# Patient Record
Sex: Female | Born: 1945
Health system: Southern US, Community
[De-identification: ages and names within clinical notes are randomized; demographics above are authoritative.]

## PROBLEM LIST (undated history)

## (undated) DIAGNOSIS — I472 Ventricular tachycardia, unspecified: Secondary | ICD-10-CM

## (undated) DIAGNOSIS — E119 Type 2 diabetes mellitus without complications: Secondary | ICD-10-CM

## (undated) DIAGNOSIS — I422 Other hypertrophic cardiomyopathy: Secondary | ICD-10-CM

## (undated) DIAGNOSIS — Z1509 Genetic susceptibility to other malignant neoplasm: Secondary | ICD-10-CM

## (undated) DIAGNOSIS — I5032 Chronic diastolic (congestive) heart failure: Secondary | ICD-10-CM

## (undated) DIAGNOSIS — I480 Paroxysmal atrial fibrillation: Secondary | ICD-10-CM

## (undated) DIAGNOSIS — R942 Abnormal results of pulmonary function studies: Secondary | ICD-10-CM

## (undated) DIAGNOSIS — I2510 Atherosclerotic heart disease of native coronary artery without angina pectoris: Secondary | ICD-10-CM

## (undated) DIAGNOSIS — I517 Cardiomegaly: Secondary | ICD-10-CM

## (undated) DIAGNOSIS — Z803 Family history of malignant neoplasm of breast: Secondary | ICD-10-CM

## (undated) DIAGNOSIS — I340 Nonrheumatic mitral (valve) insufficiency: Secondary | ICD-10-CM

## (undated) DIAGNOSIS — IMO0001 Reserved for inherently not codable concepts without codable children: Secondary | ICD-10-CM

## (undated) DIAGNOSIS — I214 Non-ST elevation (NSTEMI) myocardial infarction: Secondary | ICD-10-CM

## (undated) DIAGNOSIS — I071 Rheumatic tricuspid insufficiency: Secondary | ICD-10-CM

## (undated) HISTORY — DX: Chronic diastolic (congestive) heart failure: I50.32

## (undated) HISTORY — DX: Family history of malignant neoplasm of breast: Z80.3

## (undated) HISTORY — DX: Type 2 diabetes mellitus without complications: E11.9

## (undated) HISTORY — DX: Abnormal results of pulmonary function studies: R94.2

## (undated) HISTORY — DX: Genetic susceptibility to other malignant neoplasm: Z15.09

## (undated) HISTORY — DX: Nonrheumatic mitral (valve) insufficiency: I34.0

## (undated) HISTORY — DX: Paroxysmal atrial fibrillation: I48.0

---

## 1998-12-01 ENCOUNTER — Emergency Department (HOSPITAL_COMMUNITY): Admission: EM | Admit: 1998-12-01 | Discharge: 1998-12-01 | Payer: Self-pay | Admitting: Emergency Medicine

## 1998-12-01 ENCOUNTER — Encounter: Payer: Self-pay | Admitting: Emergency Medicine

## 1998-12-12 ENCOUNTER — Encounter: Admission: RE | Admit: 1998-12-12 | Discharge: 1999-03-12 | Payer: Self-pay | Admitting: *Deleted

## 2010-05-04 HISTORY — PX: DENTAL SURGERY: SHX609

## 2012-12-23 DIAGNOSIS — IMO0002 Reserved for concepts with insufficient information to code with codable children: Secondary | ICD-10-CM | POA: Diagnosis not present

## 2012-12-23 DIAGNOSIS — H2510 Age-related nuclear cataract, unspecified eye: Secondary | ICD-10-CM | POA: Diagnosis not present

## 2012-12-29 DIAGNOSIS — R7309 Other abnormal glucose: Secondary | ICD-10-CM | POA: Diagnosis not present

## 2012-12-29 DIAGNOSIS — IMO0002 Reserved for concepts with insufficient information to code with codable children: Secondary | ICD-10-CM | POA: Diagnosis not present

## 2012-12-31 ENCOUNTER — Inpatient Hospital Stay (HOSPITAL_COMMUNITY)
Admission: EM | Admit: 2012-12-31 | Discharge: 2013-01-17 | DRG: 571 | Disposition: A | Discharge status: Skilled Nursing Facility | Payer: Medicare Other | Attending: Family Medicine | Admitting: Family Medicine

## 2012-12-31 ENCOUNTER — Emergency Department (HOSPITAL_COMMUNITY): Payer: Medicare Other

## 2012-12-31 ENCOUNTER — Encounter (HOSPITAL_COMMUNITY): Payer: Self-pay | Admitting: Nurse Practitioner

## 2012-12-31 DIAGNOSIS — I059 Rheumatic mitral valve disease, unspecified: Secondary | ICD-10-CM | POA: Diagnosis present

## 2012-12-31 DIAGNOSIS — K5900 Constipation, unspecified: Secondary | ICD-10-CM | POA: Diagnosis not present

## 2012-12-31 DIAGNOSIS — Z4889 Encounter for other specified surgical aftercare: Secondary | ICD-10-CM | POA: Diagnosis not present

## 2012-12-31 DIAGNOSIS — I82629 Acute embolism and thrombosis of deep veins of unspecified upper extremity: Secondary | ICD-10-CM | POA: Diagnosis not present

## 2012-12-31 DIAGNOSIS — A4901 Methicillin susceptible Staphylococcus aureus infection, unspecified site: Secondary | ICD-10-CM | POA: Diagnosis present

## 2012-12-31 DIAGNOSIS — I421 Obstructive hypertrophic cardiomyopathy: Secondary | ICD-10-CM | POA: Diagnosis not present

## 2012-12-31 DIAGNOSIS — Z5189 Encounter for other specified aftercare: Secondary | ICD-10-CM | POA: Diagnosis not present

## 2012-12-31 DIAGNOSIS — I9589 Other hypotension: Secondary | ICD-10-CM | POA: Diagnosis not present

## 2012-12-31 DIAGNOSIS — M7989 Other specified soft tissue disorders: Secondary | ICD-10-CM | POA: Diagnosis not present

## 2012-12-31 DIAGNOSIS — D62 Acute posthemorrhagic anemia: Secondary | ICD-10-CM | POA: Diagnosis not present

## 2012-12-31 DIAGNOSIS — I959 Hypotension, unspecified: Secondary | ICD-10-CM | POA: Diagnosis not present

## 2012-12-31 DIAGNOSIS — M7020 Olecranon bursitis, unspecified elbow: Secondary | ICD-10-CM | POA: Diagnosis not present

## 2012-12-31 DIAGNOSIS — I4892 Unspecified atrial flutter: Secondary | ICD-10-CM | POA: Diagnosis not present

## 2012-12-31 DIAGNOSIS — E876 Hypokalemia: Secondary | ICD-10-CM | POA: Diagnosis not present

## 2012-12-31 DIAGNOSIS — IMO0002 Reserved for concepts with insufficient information to code with codable children: Secondary | ICD-10-CM | POA: Diagnosis not present

## 2012-12-31 DIAGNOSIS — E119 Type 2 diabetes mellitus without complications: Secondary | ICD-10-CM | POA: Diagnosis present

## 2012-12-31 DIAGNOSIS — L0390 Cellulitis, unspecified: Secondary | ICD-10-CM | POA: Diagnosis present

## 2012-12-31 DIAGNOSIS — D72829 Elevated white blood cell count, unspecified: Secondary | ICD-10-CM | POA: Diagnosis not present

## 2012-12-31 DIAGNOSIS — S51809A Unspecified open wound of unspecified forearm, initial encounter: Secondary | ICD-10-CM | POA: Diagnosis not present

## 2012-12-31 DIAGNOSIS — Z6830 Body mass index (BMI) 30.0-30.9, adult: Secondary | ICD-10-CM | POA: Diagnosis not present

## 2012-12-31 DIAGNOSIS — S46909A Unspecified injury of unspecified muscle, fascia and tendon at shoulder and upper arm level, unspecified arm, initial encounter: Secondary | ICD-10-CM | POA: Diagnosis not present

## 2012-12-31 DIAGNOSIS — L089 Local infection of the skin and subcutaneous tissue, unspecified: Secondary | ICD-10-CM | POA: Diagnosis not present

## 2012-12-31 DIAGNOSIS — E669 Obesity, unspecified: Secondary | ICD-10-CM | POA: Diagnosis present

## 2012-12-31 DIAGNOSIS — M609 Myositis, unspecified: Secondary | ICD-10-CM

## 2012-12-31 DIAGNOSIS — M7022 Olecranon bursitis, left elbow: Secondary | ICD-10-CM

## 2012-12-31 DIAGNOSIS — R5381 Other malaise: Secondary | ICD-10-CM | POA: Diagnosis not present

## 2012-12-31 DIAGNOSIS — A419 Sepsis, unspecified organism: Secondary | ICD-10-CM | POA: Diagnosis not present

## 2012-12-31 DIAGNOSIS — M60009 Infective myositis, unspecified site: Secondary | ICD-10-CM | POA: Diagnosis not present

## 2012-12-31 DIAGNOSIS — E871 Hypo-osmolality and hyponatremia: Secondary | ICD-10-CM | POA: Diagnosis present

## 2012-12-31 DIAGNOSIS — I422 Other hypertrophic cardiomyopathy: Secondary | ICD-10-CM | POA: Diagnosis present

## 2012-12-31 DIAGNOSIS — I951 Orthostatic hypotension: Secondary | ICD-10-CM | POA: Diagnosis not present

## 2012-12-31 DIAGNOSIS — R42 Dizziness and giddiness: Secondary | ICD-10-CM | POA: Diagnosis not present

## 2012-12-31 DIAGNOSIS — L03114 Cellulitis of left upper limb: Secondary | ICD-10-CM

## 2012-12-31 DIAGNOSIS — R7301 Impaired fasting glucose: Secondary | ICD-10-CM | POA: Diagnosis present

## 2012-12-31 DIAGNOSIS — IMO0001 Reserved for inherently not codable concepts without codable children: Secondary | ICD-10-CM | POA: Diagnosis present

## 2012-12-31 DIAGNOSIS — R269 Unspecified abnormalities of gait and mobility: Secondary | ICD-10-CM | POA: Diagnosis not present

## 2012-12-31 DIAGNOSIS — S4980XA Other specified injuries of shoulder and upper arm, unspecified arm, initial encounter: Secondary | ICD-10-CM | POA: Diagnosis not present

## 2012-12-31 DIAGNOSIS — R21 Rash and other nonspecific skin eruption: Secondary | ICD-10-CM | POA: Diagnosis not present

## 2012-12-31 DIAGNOSIS — I4891 Unspecified atrial fibrillation: Secondary | ICD-10-CM | POA: Diagnosis present

## 2012-12-31 DIAGNOSIS — R279 Unspecified lack of coordination: Secondary | ICD-10-CM | POA: Diagnosis not present

## 2012-12-31 DIAGNOSIS — I749 Embolism and thrombosis of unspecified artery: Secondary | ICD-10-CM | POA: Diagnosis not present

## 2012-12-31 DIAGNOSIS — R000 Tachycardia, unspecified: Secondary | ICD-10-CM

## 2012-12-31 DIAGNOSIS — L0291 Cutaneous abscess, unspecified: Secondary | ICD-10-CM | POA: Diagnosis not present

## 2012-12-31 DIAGNOSIS — M79609 Pain in unspecified limb: Secondary | ICD-10-CM | POA: Diagnosis not present

## 2012-12-31 DIAGNOSIS — R7881 Bacteremia: Secondary | ICD-10-CM | POA: Diagnosis not present

## 2012-12-31 HISTORY — DX: Type 2 diabetes mellitus without complications: E11.9

## 2012-12-31 LAB — BASIC METABOLIC PANEL
BUN: 24 mg/dL — ABNORMAL HIGH (ref 6–23)
CO2: 25 mEq/L (ref 19–32)
Calcium: 9.2 mg/dL (ref 8.4–10.5)
Chloride: 91 mEq/L — ABNORMAL LOW (ref 96–112)
Creatinine, Ser: 0.79 mg/dL (ref 0.50–1.10)
GFR calc Af Amer: >90 mL/min (ref >90)
GFR calc non Af Amer: 86 mL/min — ABNORMAL LOW (ref >90)
Glucose, Bld: 278 mg/dL — ABNORMAL HIGH (ref 70–99)
Potassium: 3.3 mEq/L — ABNORMAL LOW (ref 3.5–5.1)
Sodium: 130 mEq/L — ABNORMAL LOW (ref 135–145)

## 2012-12-31 LAB — CBC WITH DIFFERENTIAL/PLATELET
Basophils Absolute: 0 10*3/uL (ref 0.0–0.1)
Basophils Relative: 0 % (ref 0–1)
Eosinophils Absolute: 0.1 10*3/uL (ref 0.0–0.7)
Eosinophils Relative: 0 % (ref 0–5)
HCT: 36.3 % (ref 36.0–46.0)
Hemoglobin: 12.7 g/dL (ref 12.0–15.0)
Lymphocytes Relative: 6 % — ABNORMAL LOW (ref 12–46)
Lymphs Abs: 1.5 10*3/uL (ref 0.7–4.0)
MCH: 29.5 pg (ref 26.0–34.0)
MCHC: 35 g/dL (ref 30.0–36.0)
MCV: 84.4 fL (ref 78.0–100.0)
Monocytes Absolute: 1.2 10*3/uL — ABNORMAL HIGH (ref 0.1–1.0)
Monocytes Relative: 5 % (ref 3–12)
Neutro Abs: 21.2 10*3/uL — ABNORMAL HIGH (ref 1.7–7.7)
Neutrophils Relative %: 88 % — ABNORMAL HIGH (ref 43–77)
Platelets: 475 10*3/uL — ABNORMAL HIGH (ref 150–400)
RBC: 4.3 MIL/uL (ref 3.87–5.11)
RDW: 13.2 % (ref 11.5–15.5)
WBC: 24 10*3/uL — ABNORMAL HIGH (ref 4.0–10.5)

## 2012-12-31 MED ORDER — SODIUM CHLORIDE 0.9 % IV SOLN
INTRAVENOUS | Status: AC
  Administered 2012-12-31: 20:00:00 via INTRAVENOUS

## 2012-12-31 MED ORDER — FENTANYL CITRATE 0.05 MG/ML IJ SOLN
50.0000 ug | Freq: Once | INTRAMUSCULAR | Status: AC
  Administered 2012-12-31: 50 ug via INTRAVENOUS
  Filled 2012-12-31: qty 2

## 2012-12-31 MED ORDER — ONDANSETRON HCL 4 MG/2ML IJ SOLN
4.0000 mg | Freq: Four times a day (QID) | INTRAMUSCULAR | Status: DC | PRN
  Administered 2013-01-02: 4 mg via INTRAVENOUS
  Filled 2012-12-31 (×3): qty 2

## 2012-12-31 MED ORDER — SODIUM CHLORIDE 0.9 % IJ SOLN
3.0000 mL | Freq: Two times a day (BID) | INTRAMUSCULAR | Status: DC
  Administered 2013-01-01: 3 mL via INTRAVENOUS
  Administered 2013-01-02 – 2013-01-03 (×2): 10 mL via INTRAVENOUS
  Administered 2013-01-03 – 2013-01-16 (×12): 3 mL via INTRAVENOUS

## 2012-12-31 MED ORDER — VANCOMYCIN HCL IN DEXTROSE 750-5 MG/150ML-% IV SOLN
750.0000 mg | Freq: Once | INTRAVENOUS | Status: DC
  Filled 2012-12-31: qty 150

## 2012-12-31 MED ORDER — HYDROCODONE-ACETAMINOPHEN 5-325 MG PO TABS
1.0000 | ORAL_TABLET | ORAL | Status: DC | PRN
Start: 2012-12-31 — End: 2013-01-01
  Administered 2012-12-31: 1 via ORAL
  Administered 2013-01-01 (×2): 2 via ORAL
  Filled 2012-12-31 (×2): qty 2
  Filled 2012-12-31: qty 1

## 2012-12-31 MED ORDER — ONDANSETRON HCL 4 MG PO TABS: 4.0000 mg | ORAL_TABLET | Freq: Four times a day (QID) | ORAL | Status: DC | PRN

## 2012-12-31 MED ORDER — INSULIN ASPART 100 UNIT/ML ~~LOC~~ SOLN
0.0000 [IU] | Freq: Every day | SUBCUTANEOUS | Status: DC
  Administered 2012-12-31 – 2013-01-02 (×3): 2 [IU] via SUBCUTANEOUS

## 2012-12-31 MED ORDER — CLINDAMYCIN PHOSPHATE 600 MG/50ML IV SOLN: 600.0000 mg | Freq: Once | INTRAVENOUS | Status: DC

## 2012-12-31 MED ORDER — VANCOMYCIN HCL IN DEXTROSE 750-5 MG/150ML-% IV SOLN
750.0000 mg | Freq: Two times a day (BID) | INTRAVENOUS | Status: DC
  Administered 2013-01-01 – 2013-01-03 (×5): 750 mg via INTRAVENOUS
  Filled 2012-12-31 (×5): qty 150

## 2012-12-31 MED ORDER — SODIUM CHLORIDE 0.9 % IV BOLUS (SEPSIS)
1000.0000 mL | Freq: Once | INTRAVENOUS | Status: AC
Start: 2012-12-31 — End: 2012-12-31
  Administered 2012-12-31: 1000 mL via INTRAVENOUS

## 2012-12-31 MED ORDER — POTASSIUM CHLORIDE CRYS ER 20 MEQ PO TBCR
40.0000 meq | EXTENDED_RELEASE_TABLET | Freq: Once | ORAL | Status: AC
  Administered 2012-12-31: 40 meq via ORAL
  Filled 2012-12-31: qty 2

## 2012-12-31 MED ORDER — SODIUM CHLORIDE 0.9 % IV SOLN: INTRAVENOUS | Status: DC

## 2012-12-31 MED ORDER — PIPERACILLIN-TAZOBACTAM 3.375 G IVPB 30 MIN
3.3750 g | Freq: Once | INTRAVENOUS | Status: AC
  Administered 2012-12-31: 3.375 g via INTRAVENOUS
  Filled 2012-12-31: qty 50

## 2012-12-31 MED ORDER — HYDROMORPHONE HCL PF 1 MG/ML IJ SOLN: 1.0000 mg | INTRAMUSCULAR | Status: DC | PRN

## 2012-12-31 MED ORDER — ENOXAPARIN SODIUM 40 MG/0.4ML ~~LOC~~ SOLN
40.0000 mg | SUBCUTANEOUS | Status: DC
  Administered 2012-12-31: 40 mg via SUBCUTANEOUS
  Filled 2012-12-31 (×2): qty 0.4

## 2012-12-31 MED ORDER — PIPERACILLIN-TAZOBACTAM 3.375 G IVPB
3.3750 g | Freq: Three times a day (TID) | INTRAVENOUS | Status: DC
  Administered 2013-01-01 (×2): 3.375 g via INTRAVENOUS
  Filled 2012-12-31 (×3): qty 50

## 2012-12-31 MED ORDER — INSULIN ASPART 100 UNIT/ML ~~LOC~~ SOLN
0.0000 [IU] | Freq: Three times a day (TID) | SUBCUTANEOUS | Status: DC
  Administered 2012-12-31 – 2013-01-02 (×5): 3 [IU] via SUBCUTANEOUS
  Administered 2013-01-02: 2 [IU] via SUBCUTANEOUS
  Administered 2013-01-02 – 2013-01-03 (×2): 3 [IU] via SUBCUTANEOUS
  Administered 2013-01-03 (×2): 2 [IU] via SUBCUTANEOUS
  Administered 2013-01-04 (×2): 1 [IU] via SUBCUTANEOUS
  Administered 2013-01-04 – 2013-01-05 (×4): 2 [IU] via SUBCUTANEOUS
  Administered 2013-01-06: 1 [IU] via SUBCUTANEOUS
  Administered 2013-01-06: 3 [IU] via SUBCUTANEOUS
  Administered 2013-01-06 – 2013-01-08 (×3): 2 [IU] via SUBCUTANEOUS
  Administered 2013-01-08: 1 [IU] via SUBCUTANEOUS
  Administered 2013-01-08: 2 [IU] via SUBCUTANEOUS
  Administered 2013-01-09: 1 [IU] via SUBCUTANEOUS

## 2012-12-31 MED ORDER — ENOXAPARIN SODIUM 30 MG/0.3ML ~~LOC~~ SOLN
30.0000 mg | SUBCUTANEOUS | Status: DC
Start: 2012-12-31 — End: 2012-12-31

## 2012-12-31 MED ORDER — INSULIN ASPART 100 UNIT/ML ~~LOC~~ SOLN: 0.0000 [IU] | Freq: Three times a day (TID) | SUBCUTANEOUS | Status: DC

## 2012-12-31 NOTE — ED Notes (Signed)
Two IV unsuccessful IV startt attempts to left arm; IV team called.

## 2012-12-31 NOTE — ED Provider Notes (Signed)
History     CSN: [id number]  Arrival date & time 12/31/12  1526   First MD Initiated Contact with Patient 12/31/12 1558      Chief Complaint  Patient presents with  . Dehydration  . Cellulitis    left upper arm    (Consider location/radiation/quality/duration/timing/severity/associated sxs/prior treatment) The history is provided by the patient and a relative.  NIKKOLE PLACZEK is a 68 y.o. female history of diabetes not on medications here presenting with left arm pain. She noticed a bug bite 6 days ago and then developed cellulitis around the L elbow area. She was put on Bactrim and Keflex 2 days ago and the cellulitis has not been getting worse. No fevers or chills. But she has been feeling weak. She has dec PO intake and has been passing out. She was walking today and felt lightheaded and passed out and fell on L elbow. Denies head injury. States that tetanus is up to date. Sent by PMD for hydration and further workup.    Past Medical History  Diagnosis Date  . Diabetes mellitus without complication     dx on wed  . Mitral valve prolapse     Past Surgical History  Procedure Laterality Date  . No past surgeries      Family History  Problem Relation Age of Onset  . Cancer Mother   . Diabetes Father     History  Substance Use Topics  . Smoking status: Former Smoker -- 4 years    Quit date: 12/31/1980  . Smokeless tobacco: Never Used  . Alcohol Use: No    OB History   Grav Para Term Preterm Abortions TAB SAB Ect Mult Living            1      Review of Systems  Skin: Positive for rash.  Neurological: Positive for weakness and light-headedness.  All other systems reviewed and are negative.    Allergies  Review of patient's allergies indicates no known allergies.  Home Medications   Current Outpatient Rx  Name  Route  Sig  Dispense  Refill  . cephALEXin (KEFLEX) 500 MG capsule   Oral   Take 500 mg by mouth 4 (four) times daily.         [name]  HYDROcodone-acetaminophen (NORCO) 10-325 MG per tablet   Oral   Take 1 tablet by mouth every 6 (six) hours as needed for pain.         [name] sulfamethoxazole-trimethoprim (BACTRIM DS) 800-160 MG per tablet   Oral   Take 1 tablet by mouth 2 (two) times daily.           BP 85/63  Pulse 85  Temp(Src) 98.1 F (36.7 C) (Oral)  Resp 18  Wt 166 lb (75.297 kg)  SpO2 96%  Physical Exam  Nursing note and vitals reviewed. Constitutional: She is oriented to person, place, and time. She appears well-developed and well-nourished.  Tired, slightly dehydrated   HENT:  Head: Normocephalic.  MM slightly dry   Eyes: Conjunctivae are normal. Pupils are equal, round, and reactive to light.  Neck: Normal range of motion. Neck supple.  Cardiovascular: Normal rate and regular rhythm.   + holosystolic murmur (chronic per patient)  Pulmonary/Chest: Effort normal and breath sounds normal. No respiratory distress. She has no wheezes. She has no rales.  Abdominal: Soft. Bowel sounds are normal. She exhibits no distension. There is no tenderness. There is no rebound and no guarding.  Musculoskeletal: Normal range  of motion. She exhibits no edema and no tenderness.  Neurological: She is alert and oriented to person, place, and time.  Skin:     Bite around L elbow with surrounding cellulitis. No fluctuance. No gas. Nl ROM L elbow. 2+ pulses   Psychiatric: She has a normal mood and affect. Her behavior is normal. Judgment and thought content normal.    ED Course  Procedures (including critical care time)  Labs Reviewed  CBC WITH DIFFERENTIAL - Abnormal; Notable for the following:    WBC 24.0 (*)    Platelets 475 (*)    Neutrophils Relative % 88 (*)    Neutro Abs 21.2 (*)    Lymphocytes Relative 6 (*)    Monocytes Absolute 1.2 (*)    All other components within normal limits  BASIC METABOLIC PANEL - Abnormal; Notable for the following:    Sodium 130 (*)    Potassium 3.3 (*)    Chloride 91 (*)      Glucose, Bld 278 (*)    BUN 24 (*)    GFR calc non Af Amer 86 (*)    All other components within normal limits  LACTIC ACID, PLASMA  TROPONIN I   Dg Humerus Left  12/31/2012   *RADIOLOGY REPORT*  Clinical Data: Left arm injury.  Cellulitis.  Rule out soft tissue gas.  LEFT HUMERUS - 2+ VIEW  Comparison: None  Findings: No radio-opaque foreign body.    No soft tissue gas.  No osseous destruction.  IMPRESSION: No acute findings.   Original Report Authenticated By: Jeronimo Greaves, M.D.     No diagnosis found.    Date: 12/31/2012  Rate: 86  Rhythm: normal sinus rhythm  QRS Axis: normal  Intervals: normal  ST/T Wave abnormalities: nonspecific ST changes  Conduction Disutrbances:nonspecific intraventricular conduction delay  Narrative Interpretation:   Old EKG Reviewed: none available    MDM  CRIMSON DUBBERLY is a 67 y.o. female here with fall and L elbow cellulitis and dehydration. She seemed dehydrated and was hypotensive at triage. That is likely the cause of her syncope. She is not febrile and the cellulitis is improving so I don't think she is in severe sepsis. Will get EKG and labs. Will get L arm xray to r/o subcutaneous air. Will get orthostatic and reassess. I doubt her syncope is from arhythmias or cardiac causes.    5:54 PM Patient orthostatic. Given clinda in the ED. Xray showed no air. WBC 24. I discussed with patient and she is agreeable to stay for hydration and IV abx. I discussed with Dr. Izola Price who accepted her on tele.      Richardean Canal, MD 12/31/12 [phone]

## 2012-12-31 NOTE — Progress Notes (Signed)
Pt confirms pcp is dr Docia Chuck EPIC updated

## 2012-12-31 NOTE — ED Notes (Signed)
Note:  One set of blood cultures sent to lab by phlebotomy

## 2012-12-31 NOTE — ED Notes (Signed)
Report given to [facility], charge RN upstairs.  Needed to give pt pain medication.  Phlebotomy drawing blood cx so that first antibiotic therapy can be started.

## 2012-12-31 NOTE — ED Notes (Signed)
Placed PIV in right hand- IV team called x2 and no other RNs available to stick.  Had to use a 24g as the larger angiocaths would blow the vein- pt very dehydrated.

## 2012-12-31 NOTE — Progress Notes (Signed)
ANTIBIOTIC CONSULT NOTE - INITIAL  Pharmacy Consult for Vancomycin and Zosyn Indication: Cellulits of left arm  No Known Allergies  Patient Measurements: Weight: 166 lb (75.297 kg)   Vital Signs: Temp: 98.1 F (36.7 C) (05/30 1546) Temp src: Oral (05/30 1546) BP: 85/63 mmHg (05/30 1723) Pulse Rate: 85 (05/30 1723) Intake/Output from previous day:   Intake/Output from this shift:    Labs:  Recent Labs  12/31/12 1638  WBC 24.0*  HGB 12.7  PLT 475*  CREATININE 0.79   CrCl is unknown because there is no height on file for the current visit. No results found for this basename: VANCOTROUGH, VANCOPEAK, VANCORANDOM, GENTTROUGH, GENTPEAK, GENTRANDOM, TOBRATROUGH, TOBRAPEAK, TOBRARND, AMIKACINPEAK, AMIKACINTROU, AMIKACIN,  in the last 72 hours   Microbiology: No results found for this or any previous visit (from the past 720 hour(s)).  Medical History: Past Medical History  Diagnosis Date  . Diabetes mellitus without complication     dx on wed  . Mitral valve prolapse     Medications:  Scheduled:  . enoxaparin (LOVENOX) injection  40 mg Subcutaneous Q24H  . [START ON 01/01/2013] insulin aspart  0-9 Units Subcutaneous TID WC  . potassium chloride  40 mEq Oral Once  . sodium chloride  3 mL Intravenous Q12H  . vancomycin  750 mg Intravenous Once   Infusions:  . sodium chloride    . piperacillin-tazobactam     Assessment: 67 yo with history of diabetes c/o L arm pain.  Pt noticed bug bite 6 days ago that developed into cellulitis.  Pt placed on Bactrim and Keflex 2 days ago with some improvement in cellulitis.  Today pt presents with dehydration and hypotension. MD ordering Zosyn and Vancomycin per Rx.  Goal of Therapy:  Vancomycin trough level 10-15 mcg/ml  Plan:   Zosyn 3.375 Gm IV q8h (EI infusion)  Vancomycin 750 mg IV q12h.  CrCl~64 (N)  F/u Scr/levels/cultures as needed.  [name] 12/31/2012,6:06 PM

## 2012-12-31 NOTE — ED Notes (Signed)
Pt up for discharge, but Keppra is still infusing

## 2012-12-31 NOTE — ED Notes (Addendum)
Per Pt:  Pt began having swelling of left lower arm up to elbow on Saturday after a bug bite on elbow.  Pt visited [place] on Wednesday due to upper and left arm swelling; started on keflex and bactrim.  Pt started having nausea and weakness after taking bactrim.  Pt denies vomiting and diarrhea.  Pt states that she is not drinking enough water.  Hx: pt has impaired vision in bilaterally- having cataract surgery soon.

## 2012-12-31 NOTE — H&P (Signed)
[facility] History and Physical  [name] JXB:[id number] DOB: Oct 07, 1945 DOA: 12/31/2012  Referring physician: ED physician PCP: [name], MD   Chief Complaint: redness and swelling of the left upper extremity    HPI:  Pt is 67 yo female who presented to [facility] ED with main concern of progressively worsening left arm pain and swelling located around elbow area, started one week prior to admission, associated with throbbing and constant pain, erythema and warmth to touch, 5/10 in severity with no specific radiation and no specific aggravating or alleviating factors. Pt denies similar events in the past, repost subjective fevers, chill, no other systemic concern. Pt explains she was started on Bactrim and Keflex by PCP 2 days piror to admission but has not gotten any better and her PCP asked for direct admission to [facility].  In ED, pt found to have significant swelling and erythema of the left arm, leukocytosis WBC 24, 000, hypotensive with SBP in 80's. [facility] asked to admit for further evaluation.   Assessment and Plan:  Principal Problem:   Cellulitis of the left upper extremity  - will start broad spectrum ABX for now given pt's leukocytosis, hypotension, fever in ED - we can plan on narrowing down ABX if pt clinically improving - follow upon blood culture, keep extremity elevated  Active Problems:   Fasting hyperglycemia - will check A1C, start SSI - likely newly diagnosed diabetes - will ask diabetic educator for consultation    Leukocytosis - secondary to cellulitis, CBC in AM - continue ABX as noted above   Hypotension - in the setting of fever, infection, SIRS - continue IVF, pt has received 2 L NS boluses in ED - monitor on telemetry    Hyponatremia - likely of pre renal etiology - IVF as noted above - BMP in AM   Hypokalemia - mild, will supplement with Kdur x 1 dose 40 MEQ PO - BMP in AM  Code Status: Full Family Communication: Pt at bedside Disposition  Plan: Admit to telemetry bed   Review of Systems:  Constitutional: Positive for fever, chills and malaise/fatigue. Negative for diaphoresis.  HENT: Negative for hearing loss, ear pain, nosebleeds, congestion, sore throat, neck pain, tinnitus and ear discharge.   Eyes: Negative for blurred vision, double vision, photophobia, pain, discharge and redness.  Respiratory: Negative for cough, hemoptysis, sputum production, shortness of breath, wheezing and stridor.   Cardiovascular: Negative for chest pain, palpitations, orthopnea, claudication and leg swelling.  Gastrointestinal: Negative for nausea, vomiting and abdominal pain. Negative for heartburn, constipation, blood in stool and melena.  Genitourinary: Negative for dysuria, urgency, frequency, hematuria and flank pain.  Musculoskeletal: Negative for myalgias, back pain, joint pain.  Skin: Negative for itching.  Neurological: Negative for tingling, tremors, sensory change, speech change, focal weakness, loss of consciousness and headaches.  Endo/Heme/Allergies: Negative for environmental allergies and polydipsia. Does not bruise/bleed easily.  Psychiatric/Behavioral: Negative for suicidal ideas. The patient is not nervous/anxious.      Past Medical History  Diagnosis Date  . Diabetes mellitus without complication     dx on wed  . Mitral valve prolapse     Past Surgical History  Procedure Laterality Date  . Dental surgery  Oct. 2011    several extractions, bone graft    Social History:  reports that she quit smoking about 32 years ago. She has never used smokeless tobacco. She reports that she does not drink alcohol or use illicit drugs.  No Known Allergies  Family History  Problem Relation Age of Onset  . Cancer Mother   . Diabetes Father     Prior to Admission medications   Medication Sig Start Date End Date Taking? Authorizing Provider  cephALEXin (KEFLEX) 500 MG capsule Take 500 mg by mouth 4 (four) times daily.   Yes  Historical Provider, MD  HYDROcodone-acetaminophen (NORCO) 10-325 MG per tablet Take 1 tablet by mouth every 6 (six) hours as needed for pain.   Yes Historical Provider, MD  sulfamethoxazole-trimethoprim (BACTRIM DS) 800-160 MG per tablet Take 1 tablet by mouth 2 (two) times daily.   Yes Historical Provider, MD    Physical Exam: Filed Vitals:   12/31/12 1546 12/31/12 1721 12/31/12 1721 12/31/12 1723  BP: 84/46 139/62 113/64 85/63  Pulse: 83 86 88 85  Temp: 98.1 F (36.7 C)     TempSrc: Oral     Resp: 18     Weight: 75.297 kg (166 lb)     SpO2: 96%       Physical Exam  Constitutional: Appears well-developed and well-nourished. No distress.  HENT: Normocephalic. External right and left ear normal. Oropharynx is clear and moist.  Eyes: Conjunctivae and EOM are normal. PERRLA, no scleral icterus.  Neck: Normal ROM. Neck supple. No JVD. No tracheal deviation. No thyromegaly.  CVS: RRR, S1/S2 +, no murmurs, no gallops, no carotid bruit.  Pulmonary: Effort and breath sounds normal, no stridor, rhonchi, wheezes, rales.  Abdominal: Soft. BS +,  no distension, tenderness, rebound or guarding.  Musculoskeletal: Normal range of motion. Left arm swelling and erythema, tenderness to palpation Lymphadenopathy: No lymphadenopathy noted, cervical, inguinal. Neuro: Alert. Normal reflexes, muscle tone coordination. No cranial nerve deficit. Skin: Skin is warm and dry. No rash noted. Not diaphoretic. No erythema. No pallor.  Psychiatric: Normal mood and affect. Behavior, judgment, thought content normal.   Labs on Admission:  Basic Metabolic Panel:  Recent Labs Lab 12/31/12 1638  NA 130*  K 3.3*  CL 91*  CO2 25  GLUCOSE 278*  BUN 24*  CREATININE 0.79  CALCIUM 9.2   CBC:  Recent Labs Lab 12/31/12 1638  WBC 24.0*  NEUTROABS 21.2*  HGB 12.7  HCT 36.3  MCV 84.4  PLT 475*   Cardiac Enzymes:  Recent Labs Lab 12/31/12 1638  TROPONINI <0.30   Radiological Exams on Admission: Dg  Humerus Left  12/31/2012   *RADIOLOGY REPORT*  Clinical Data: Left arm injury.  Cellulitis.  Rule out soft tissue gas.  LEFT HUMERUS - 2+ VIEW  Comparison: None  Findings: No radio-opaque foreign body.    No soft tissue gas.  No osseous destruction.  IMPRESSION: No acute findings.   Original Report Authenticated By: Jeronimo Greaves, M.D.    EKG: Normal sinus rhythm, no ST/T wave changes  Debbora Presto, MD  [facility] Pager [phone]  If 7PM-7AM, please contact night-coverage [email or web address] Password [facility] 12/31/2012, 5:57 PM

## 2012-12-31 NOTE — ED Notes (Signed)
Patient transported to X-ray 

## 2012-12-31 NOTE — ED Notes (Signed)
Secretary called for pumps and modules.

## 2013-01-01 ENCOUNTER — Inpatient Hospital Stay (HOSPITAL_COMMUNITY): Payer: Medicare Other

## 2013-01-01 ENCOUNTER — Encounter (HOSPITAL_COMMUNITY): Payer: Self-pay | Admitting: Cardiology

## 2013-01-01 DIAGNOSIS — E876 Hypokalemia: Secondary | ICD-10-CM

## 2013-01-01 DIAGNOSIS — L0291 Cutaneous abscess, unspecified: Secondary | ICD-10-CM

## 2013-01-01 DIAGNOSIS — I959 Hypotension, unspecified: Secondary | ICD-10-CM

## 2013-01-01 DIAGNOSIS — E119 Type 2 diabetes mellitus without complications: Secondary | ICD-10-CM | POA: Diagnosis present

## 2013-01-01 DIAGNOSIS — D72829 Elevated white blood cell count, unspecified: Secondary | ICD-10-CM

## 2013-01-01 DIAGNOSIS — I4892 Unspecified atrial flutter: Secondary | ICD-10-CM

## 2013-01-01 LAB — GLUCOSE, CAPILLARY: Glucose-Capillary: 215 mg/dL — ABNORMAL HIGH (ref 70–99)

## 2013-01-01 LAB — CBC
HCT: 32.2 % — ABNORMAL LOW (ref 36.0–46.0)
Hemoglobin: 10.6 g/dL — ABNORMAL LOW (ref 12.0–15.0)
MCH: 28.1 pg (ref 26.0–34.0)
MCHC: 32.9 g/dL (ref 30.0–36.0)
MCV: 85.4 fL (ref 78.0–100.0)

## 2013-01-01 LAB — BASIC METABOLIC PANEL
BUN: 26 mg/dL — ABNORMAL HIGH (ref 6–23)
CO2: 24 mEq/L (ref 19–32)
GFR calc non Af Amer: >90 mL/min (ref >90)
Glucose, Bld: 226 mg/dL — ABNORMAL HIGH (ref 70–99)
Potassium: 3.2 mEq/L — ABNORMAL LOW (ref 3.5–5.1)

## 2013-01-01 LAB — LIPID PANEL
Cholesterol: 155 mg/dL (ref 0–200)
Cholesterol: 133 mg/dL (ref 0–200)
HDL: 8 mg/dL — ABNORMAL LOW (ref >39)
LDL Cholesterol: 117 mg/dL — ABNORMAL HIGH (ref 0–99)
Total CHOL/HDL Ratio: 16.6 RATIO
VLDL: 31 mg/dL (ref 0–40)

## 2013-01-01 LAB — HEMOGLOBIN A1C
Hgb A1c MFr Bld: 10.6 % — ABNORMAL HIGH (ref <5.7)
Mean Plasma Glucose: 258 mg/dL — ABNORMAL HIGH (ref <117)

## 2013-01-01 LAB — TSH: TSH: 4.551 u[IU]/mL — ABNORMAL HIGH (ref 0.350–4.500)

## 2013-01-01 MED ORDER — DILTIAZEM HCL 100 MG IV SOLR: 5.0000 mg/h | INTRAVENOUS | Status: DC

## 2013-01-01 MED ORDER — SODIUM CHLORIDE 0.9 % IV BOLUS (SEPSIS)
250.0000 mL | Freq: Once | INTRAVENOUS | Status: AC
  Administered 2013-01-01: 250 mL via INTRAVENOUS

## 2013-01-01 MED ORDER — DILTIAZEM HCL 100 MG IV SOLR
5.0000 mg/h | INTRAVENOUS | Status: DC
  Administered 2013-01-01: 5 mg/h via INTRAVENOUS
  Administered 2013-01-01: 18:00:00 via INTRAVENOUS
  Filled 2013-01-01: qty 100

## 2013-01-01 MED ORDER — MELOXICAM 15 MG PO TABS
15.0000 mg | ORAL_TABLET | Freq: Every day | ORAL | Status: DC
  Administered 2013-01-01 – 2013-01-06 (×6): 15 mg via ORAL
  Filled 2013-01-01 (×7): qty 1

## 2013-01-01 MED ORDER — POTASSIUM CHLORIDE CRYS ER 20 MEQ PO TBCR
40.0000 meq | EXTENDED_RELEASE_TABLET | Freq: Once | ORAL | Status: AC
  Administered 2013-01-01: 40 meq via ORAL
  Filled 2013-01-01: qty 2

## 2013-01-01 MED ORDER — METOPROLOL TARTRATE 25 MG PO TABS
25.0000 mg | ORAL_TABLET | Freq: Three times a day (TID) | ORAL | Status: DC
  Administered 2013-01-02 – 2013-01-03 (×4): 25 mg via ORAL
  Filled 2013-01-01 (×8): qty 1

## 2013-01-01 MED ORDER — SENNA 8.6 MG PO TABS
1.0000 | ORAL_TABLET | Freq: Every day | ORAL | Status: DC | PRN
  Administered 2013-01-01 – 2013-01-11 (×7): 8.6 mg via ORAL
  Filled 2013-01-01 (×7): qty 1

## 2013-01-01 MED ORDER — TRAMADOL HCL 50 MG PO TABS
50.0000 mg | ORAL_TABLET | Freq: Four times a day (QID) | ORAL | Status: DC | PRN
  Administered 2013-01-02 – 2013-01-14 (×12): 50 mg via ORAL
  Filled 2013-01-01 (×12): qty 1

## 2013-01-01 MED ORDER — POTASSIUM CHLORIDE 10 MEQ/100ML IV SOLN: 10.0000 meq | INTRAVENOUS | Status: DC

## 2013-01-01 MED ORDER — SULFAMETHOXAZOLE-TMP DS 800-160 MG PO TABS
1.0000 | ORAL_TABLET | Freq: Two times a day (BID) | ORAL | Status: DC
  Administered 2013-01-01: 1 via ORAL
  Filled 2013-01-01 (×3): qty 1

## 2013-01-01 MED ORDER — ENOXAPARIN SODIUM 80 MG/0.8ML ~~LOC~~ SOLN
80.0000 mg | Freq: Two times a day (BID) | SUBCUTANEOUS | Status: DC
  Administered 2013-01-01 – 2013-01-05 (×8): 80 mg via SUBCUTANEOUS
  Filled 2013-01-01 (×10): qty 0.8

## 2013-01-01 MED ORDER — ASPIRIN 325 MG PO TABS
325.0000 mg | ORAL_TABLET | Freq: Every day | ORAL | Status: DC
  Administered 2013-01-01: 325 mg via ORAL
  Filled 2013-01-01 (×2): qty 1

## 2013-01-01 MED ORDER — DILTIAZEM LOAD VIA INFUSION
10.0000 mg | Freq: Once | INTRAVENOUS | Status: AC
  Administered 2013-01-01: 10 mg via INTRAVENOUS
  Filled 2013-01-01: qty 10

## 2013-01-01 NOTE — Progress Notes (Signed)
Assumed care of pt at 3pm, noted to be in Afib w/ rate trending to 150s, Dr Cena Benton aware, EKG done.,  BP 88/60, NS bolus started as ordered

## 2013-01-01 NOTE — Consult Note (Signed)
Cardiology Consult Note  Admit date: 12/31/2012 Name: [name] 67 y.o.  female DOB:  1945-10-14 MRN:  [id number]  Today's date:  01/01/2013  Referring Physician:    Dr. Penny Pia  Primary Physician:    Dr. Geri Seminole  Reason for Consultation: New onset of atrial fibrillation  IMPRESSIONS: 1. Atrial fibrillation new onset this morning with rapid response 2. Cellulitis with early sepsis syndrome 3. Untreated type 2 diabetes mellitus 4. Overweight 5. History of mitral valve prolapse  RECOMMENDATION: 1. Move to step down unit and initiate full dose anticoagulation as well as intravenous diltiazem. If she does not convert following the initiation of diltiazem would add beta blocker and we may add either early amiodarone or flexion I didn't attempt to convert her. 2. Obtain echocardiogram and TSH 3. 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 She has a prior diagnosis of diabetes but stopped taking medicine several years ago. She had not had any medical care recently. She carries a prior diagnosis of mitral valve prolapse. She normally has no chest pain or shortness of breath or significant arrhythmias.  She developed cellulitis of her left arm and eventually sought attention by Dr. Veverly Fells last week and was started on antibiotics and had a recheck on Friday. She was told that she was somewhat dehydrated. She presented to the emergency room yesterday with progressive worsening left arm pain and swelling in her primary physician had asked her to be admitted to the hospital. She was hypotensive on admission required some fluid resuscitation and had a white count of 24,000. She was started on treatment with Zosyn. She was also found to be diabetic. She was in sinus rhythm on admission and was on telemetry overnight but this morning at 7:30 she went into atrial fibrillation following some PACs. Atrial fibrillation is persistent  throughout the day and I was called late this afternoon by her physician noting that she had rapid atrial fibrillation as well as some mild reduction in her blood pressure. She is currently asymptomatic and denies any shortness of breath, weakness, and is unaware that she is out of rhythm.   Past Medical History  Diagnosis Date  . Diabetes mellitus without complication     dx on wed  . Mitral valve prolapse   . Atrial flutter       Past Surgical History  Procedure Laterality Date  . Dental surgery  Oct. 2011    several extractions, bone graft     Allergies:  has No Known Allergies.   Medications: Prior to Admission medications   Medication Sig Start Date End Date Taking? Authorizing Provider  cephALEXin (KEFLEX) 500 MG capsule Take 500 mg by mouth 4 (four) times daily.   Yes Historical Provider, MD  HYDROcodone-acetaminophen (NORCO) 10-325 MG per tablet Take 1 tablet by mouth every 6 (six) hours as needed for pain.   Yes Historical Provider, MD  sulfamethoxazole-trimethoprim (BACTRIM DS) 800-160 MG per tablet Take 1 tablet by mouth 2 (two) times daily.   Yes Historical Provider, MD    Family History: Family Status  Relation Status Death Age  . Mother Deceased 7    died of breast cancer  . Father Deceased 22    died of stroke  . Sister Deceased 41    died of heart disease  . Sister Alive   . Sister Alive     Social History:   reports that she quit smoking about 32 years  ago. She has never used smokeless tobacco. She reports that she does not drink alcohol or use illicit drugs.   History   Social History Narrative   Divorced.  Lives with daughter and grandson. 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    Review of Systems: She has been under significant situational stress with the daughter who has significant mental illness. 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 She is due to have surgery next  week. Mild numbness of the feet.  Other than as noted above the remainder of the review of systems is unremarkable.  Physical Exam: BP 119/68  Pulse 77  Temp(Src) 98.5 F (36.9 C) (Oral)  Resp 17  Ht 5\' 4"  (1.626 m)  Wt 76.567 kg (168 lb 12.8 oz)  BMI 28.96 kg/m2  SpO2 98%  General appearance: Pleasant white female lying in bed who is mildly overweight and in no acute distress Head: Normocephalic, without obvious abnormality, atraumatic Eyes: conjunctivae/corneas clear. PERRL, EOM's intact. Fundi not examined Throat: lips, mucosa, and tongue normal; teeth and gums normal Lungs: clear to auscultation bilaterally Heart: Rapid irregular rhythm, normal S1-S2 no S3 no murmur Abdomen: soft, non-tender; bowel sounds normal; no masses,  no organomegaly Pelvic: deferred Extremities: Significant erythema and swelling of the left arm around the elbow and posterior in the upper arm with some tenderness Pulses: 2+ and symmetric Neurologic: Grossly normal  Labs: CBC  Recent Labs  12/31/12 1638 01/01/13 0557  WBC 24.0* 18.5*  RBC 4.30 3.77*  HGB 12.7 10.6*  HCT 36.3 32.2*  PLT 475* 360  MCV 84.4 85.4  MCH 29.5 28.1  MCHC 35.0 32.9  RDW 13.2 13.6  LYMPHSABS 1.5  --   MONOABS 1.2*  --   EOSABS 0.1  --   BASOSABS 0.0  --    CMP   Recent Labs  01/01/13 0557  NA 131*  K 3.2*  CL 96  CO2 24  GLUCOSE 226*  BUN 26*  CREATININE 0.65  CALCIUM 8.3*  GFRNONAA >90  GFRAA >90   Cardiac Panel (last 3 results)  Recent Labs  12/31/12 1638  TROPONINI <0.30     Radiology: Cardiomegaly, prominent interstitial markings  EKG: Atrial fibrillation and flutter with rapid ventricular response. Nonspecific ST changes  Signed:  W. Ashley Royalty MD [facility]   Cardiology Consultant  01/01/2013, 4:49 PM

## 2013-01-01 NOTE — Progress Notes (Signed)
ANTICOAGULATION CONSULT NOTE - Initial Consult  Pharmacy Consult for Lovenox Indication: atrial fibrillation  No Known Allergies  Patient Measurements: Height: 5\' 4"  (162.6 cm) Weight: 168 lb 12.8 oz (76.567 kg) IBW/kg (Calculated) : 54.7  Vital Signs: Temp: 98.5 F (36.9 C) (05/31 1300) Temp src: Oral (05/31 1300) BP: 90/62 mmHg (05/31 1653) Pulse Rate: 150 (05/31 1526)  Labs:  Recent Labs  12/31/12 1638 01/01/13 0557  HGB 12.7 10.6*  HCT 36.3 32.2*  PLT 475* 360  CREATININE 0.79 0.65  TROPONINI <0.30  --     Estimated Creatinine Clearance: 70.3 ml/min (by C-G formula based on Cr of 0.65).   Medical History: Past Medical History  Diagnosis Date  . Diabetes mellitus without complication     dx on wed  . Mitral valve prolapse   . Atrial flutter     Medications:  Scheduled:  . aspirin  325 mg Oral Daily  . diltiazem  10 mg Intravenous Once  . enoxaparin (LOVENOX) injection  40 mg Subcutaneous Q24H  . insulin aspart  0-5 Units Subcutaneous QHS  . insulin aspart  0-9 Units Subcutaneous TID WC  . meloxicam  15 mg Oral Daily  . metoprolol tartrate  25 mg Oral Q8H  . piperacillin-tazobactam (ZOSYN)  IV  3.375 g Intravenous Q8H  . sodium chloride  3 mL Intravenous Q12H  . vancomycin  750 mg Intravenous Once  . vancomycin  750 mg Intravenous Q12H   Infusions:  . diltiazem (CARDIZEM) infusion      Assessment: New onset Afib to start full dose anticoagulation with Lovenox per cards order. Stable CBC and renal function. Patient received 40mg  of Lovenox last PM. No reported bleeding  Goal of Therapy:  Anti-Xa level 0.6-1.2 units/ml 4hrs after LMWH dose given Monitor platelets by anticoagulation protocol: Yes  Plan:  Lovenox 1mg /kg (80mg ) SQ q12 Daily CBC  Hessie Knows, PharmD, BCPS Pager [phone] 01/01/2013 5:20 PM

## 2013-01-01 NOTE — Progress Notes (Signed)
[facility] PROGRESS NOTE  [name]:[id number] DOB: [birth year] DOA: 12/31/2012 PCP: [name], MD  Assessment/Plan: 1. Atrial flutter - At this point will transfer patient to Step down unit - fluid bolus - replace potassium IV - Consulted cardiology. Would like to thank Dr. Donnie Aho for input  - Most likely related to recent infectious etiology - check magnesium and phosphorus levels - Cardizem bolus and gtt. Orders placed.  2. Cellulitis - Improving and most likely contributing to # 1 - Continue broad spectrum antibiotics, once continued improvement will place on oral antibiotics either bactrim or doxycycline. - wbc trending down  3. Hypokalemia - May be contributing to # 1 - will replace via IV x 2 runs. - recheck level - check magnesium  4. Hypotension - likely multifactorial given # 1.  Patient has elevated BUN/Creatinine ratio and as such suspect that patient is intravascularly depleted.  - improved with IVF hydration - Will monitor blood pressure closely with planned administration of cardizem bolus and gtt.  5. Hyponatremia - likely due to poor oral solute intake.  Code Status: Full code Family Communication: No family at bedside. Disposition Plan: To step down unit   Consultants:  Cardiology  Procedures:  none  Antibiotics:  Vancomycin and Zosyn  HPI/Subjective: Pt has no new complaints.  Denies any chest discomfort or SOB. Nurse reported to me that patient was in atrial fibrillation and subsequently the above plan was formulated.  Objective: Filed Vitals:   12/31/12 1902 12/31/12 2010 01/01/13 0548 01/01/13 1300  BP: 133/60 113/60 121/52 119/68  Pulse: 83 84 73 77  Temp:  98 F (36.7 C) 98.4 F (36.9 C) 98.5 F (36.9 C)  TempSrc:  Oral Oral Oral  Resp: 17 18 18 17   Height:  5\' 4"  (1.626 m)    Weight:  76.567 kg (168 lb 12.8 oz)    SpO2: 91% 100% 97% 98%   No intake or output data in the 24 hours ending 01/01/13  1603 Filed Weights   12/31/12 1546 12/31/12 2010  Weight: 75.297 kg (166 lb) 76.567 kg (168 lb 12.8 oz)    Exam:   General:  Pt in NAD, Alert and Awake  Cardiovascular: Irregularly regular, no murmurs  Respiratory: CTA BL, no wheezes  Abdomen: soft, NT, ND  Musculoskeletal: no cyanosis or clubbing   Data Reviewed: Basic Metabolic Panel:  Recent Labs Lab 12/31/12 1638 01/01/13 0557  NA 130* 131*  K 3.3* 3.2*  CL 91* 96  CO2 25 24  GLUCOSE 278* 226*  BUN 24* 26*  CREATININE 0.79 0.65  CALCIUM 9.2 8.3*   Liver Function Tests: No results found for this basename: AST, ALT, ALKPHOS, BILITOT, PROT, ALBUMIN,  in the last 168 hours No results found for this basename: LIPASE, AMYLASE,  in the last 168 hours No results found for this basename: AMMONIA,  in the last 168 hours CBC:  Recent Labs Lab 12/31/12 1638 01/01/13 0557  WBC 24.0* 18.5*  NEUTROABS 21.2*  --   HGB 12.7 10.6*  HCT 36.3 32.2*  MCV 84.4 85.4  PLT 475* 360   Cardiac Enzymes:  Recent Labs Lab 12/31/12 1638  TROPONINI <0.30   BNP (last 3 results) No results found for this basename: PROBNP,  in the last 8760 hours CBG:  Recent Labs Lab 12/31/12 2013 12/31/12 2158 01/01/13 0816  GLUCAP 215* 228* 217*    Recent Results (from the past 240 hour(s))  CULTURE, BLOOD (ROUTINE X 2)     Status: None  Collection Time    12/31/12  6:45 PM      Result Value Range Status   Specimen Description BLOOD RIGHT ARM   Final   Special Requests BOTTLES DRAWN AEROBIC AND ANAEROBIC   Final   Culture  Setup Time 12/31/2012 23:44   Final   Culture     Final   Value:        BLOOD CULTURE RECEIVED NO GROWTH TO DATE CULTURE WILL BE HELD FOR 5 DAYS BEFORE ISSUING A FINAL NEGATIVE REPORT   Report Status PENDING   Incomplete  CULTURE, BLOOD (ROUTINE X 2)     Status: None   Collection Time    12/31/12  6:51 PM      Result Value Range Status   Specimen Description BLOOD RIGHT HAND   Final   Special Requests  BOTTLES DRAWN AEROBIC AND ANAEROBIC   Final   Culture  Setup Time 12/31/2012 23:43   Final   Culture     Final   Value:        BLOOD CULTURE RECEIVED NO GROWTH TO DATE CULTURE WILL BE HELD FOR 5 DAYS BEFORE ISSUING A FINAL NEGATIVE REPORT   Report Status PENDING   Incomplete     Studies: Dg Humerus Left  12/31/2012   *RADIOLOGY REPORT*  Clinical Data: Left arm injury.  Cellulitis.  Rule out soft tissue gas.  LEFT HUMERUS - 2+ VIEW  Comparison: None  Findings: No radio-opaque foreign body.    No soft tissue gas.  No osseous destruction.  IMPRESSION: No acute findings.   Original Report Authenticated By: Jeronimo Greaves, M.D.    Scheduled Meds: . aspirin  325 mg Oral Daily  . enoxaparin (LOVENOX) injection  40 mg Subcutaneous Q24H  . insulin aspart  0-5 Units Subcutaneous QHS  . insulin aspart  0-9 Units Subcutaneous TID WC  . meloxicam  15 mg Oral Daily  . piperacillin-tazobactam (ZOSYN)  IV  3.375 g Intravenous Q8H  . sodium chloride  250 mL Intravenous Once  . sodium chloride  250 mL Intravenous Once  . sodium chloride  3 mL Intravenous Q12H  . vancomycin  750 mg Intravenous Once  . vancomycin  750 mg Intravenous Q12H   Continuous Infusions:   Principal Problem:   Cellulitis Active Problems:   Hyponatremia   Hypokalemia   Fasting hyperglycemia   Leukocytosis   Hypotension    Time spent: > 45 minutes    Penny Pia  [facility] Pager [phone] 7PM-7AM, please contact night-coverage at [email or web address], password [facility] 01/01/2013, 4:03 PM  LOS: 1 day

## 2013-01-02 DIAGNOSIS — L0390 Cellulitis, unspecified: Secondary | ICD-10-CM

## 2013-01-02 DIAGNOSIS — E876 Hypokalemia: Secondary | ICD-10-CM

## 2013-01-02 DIAGNOSIS — I4891 Unspecified atrial fibrillation: Secondary | ICD-10-CM

## 2013-01-02 DIAGNOSIS — L0291 Cutaneous abscess, unspecified: Secondary | ICD-10-CM

## 2013-01-02 DIAGNOSIS — E871 Hypo-osmolality and hyponatremia: Secondary | ICD-10-CM

## 2013-01-02 LAB — GLUCOSE, CAPILLARY: Glucose-Capillary: 210 mg/dL — ABNORMAL HIGH (ref 70–99)

## 2013-01-02 LAB — COMPREHENSIVE METABOLIC PANEL
ALT: 19 U/L (ref 0–35)
AST: 18 U/L (ref 0–37)
Albumin: 2.1 g/dL — ABNORMAL LOW (ref 3.5–5.2)
Alkaline Phosphatase: 230 U/L — ABNORMAL HIGH (ref 39–117)
Chloride: 95 mEq/L — ABNORMAL LOW (ref 96–112)
Potassium: 3.6 mEq/L (ref 3.5–5.1)
Sodium: 129 mEq/L — ABNORMAL LOW (ref 135–145)
Total Bilirubin: 0.5 mg/dL (ref 0.3–1.2)

## 2013-01-02 LAB — OSMOLALITY, URINE: Osmolality, Ur: 635 mOsm/kg (ref 390–1090)

## 2013-01-02 LAB — PRO B NATRIURETIC PEPTIDE: Pro B Natriuretic peptide (BNP): 6103 pg/mL — ABNORMAL HIGH (ref 0–125)

## 2013-01-02 LAB — OSMOLALITY: Osmolality: 285 mOsm/kg (ref 275–300)

## 2013-01-02 MED ORDER — ASPIRIN EC 81 MG PO TBEC
81.0000 mg | DELAYED_RELEASE_TABLET | Freq: Every day | ORAL | Status: DC
  Administered 2013-01-02 – 2013-01-08 (×6): 81 mg via ORAL
  Filled 2013-01-02 (×8): qty 1

## 2013-01-02 MED ORDER — PIPERACILLIN-TAZOBACTAM 3.375 G IVPB
3.3750 g | Freq: Three times a day (TID) | INTRAVENOUS | Status: DC
  Administered 2013-01-02 – 2013-01-03 (×4): 3.375 g via INTRAVENOUS
  Filled 2013-01-02 (×4): qty 50

## 2013-01-02 NOTE — Progress Notes (Signed)
[facility] PROGRESS NOTE  [name] ACZ:[id number] DOB: [birth year] DOA: 12/31/2012 PCP: [name], MD  Assessment/Plan: 1. Atrial flutter - Patient transferred to step down unit yesterday.  Will continue to monitor in stepdown unit.  Cardizem has been discontinued. Patient is currently on B blocker. - Potassium within normal limits after replacement. - Consulted cardiology. Would like to thank Dr. Donnie Aho for input  - Most likely related to recent infectious etiology - Magnesium and phosphorus within normal limits. - Most likely cause of elevated BNP  2. Cellulitis - Improving and most likely contributing to # 1 - Yesterday due to problems with line had to change from zosyn to oral regimen as zosyn and cardizem were not compatible through the line per discussion with nursing.  As such placed on bactrim.  Given that patient is off of cardizem and degree of infection possibly contributing to # 1 and low blood pressures I will restart zosyn. - wbc trending down  3. Hypokalemia - Resolved after replacement. - magnesium within normal limits.  4. Hypotension - likely multifactorial given # 1.  Patient has elevated BUN/Creatinine ratio and as such suspect that patient is intravascularly depleted.  - Has resolved with improved heart rate and IVF  5. Hyponatremia - Will obtain urine osmolality, serum osmolality, and urine sodium levels - With elevated BNP and chest x ray concerning for interstitial edema, concern is for hypervolemic hyponatremia - Saline lock patient  6. DM II - At this point diabetic coordinator has been consulted - continue SSI - blood sugars in the low 200's - Patient does not have any hypoglycemic agents listed on home medication list.  - Will have to adjust and make recommendations regarding patient's home regimen moving forward. With patient having recent nausea this morning I am reluctant to start metformin at this point.  Code Status: Full  code Family Communication: No family at bedside. Disposition Plan: To step down unit   Consultants:  Cardiology  Procedures:  none  Antibiotics:  Vancomycin and Zosyn  HPI/Subjective: Pt has no new complaints.  Denies any chest discomfort or SOB. Nurse reported to me that patient was in atrial fibrillation and subsequently the above plan was formulated.  Objective: Filed Vitals:   01/02/13 0300 01/02/13 0400 01/02/13 0600 01/02/13 0800  BP: 110/49 117/57 115/51   Pulse: 74 85 84   Temp:  98.3 F (36.8 C)  98.4 F (36.9 C)  TempSrc:  Oral  Oral  Resp: 25 22 26    Height:      Weight:      SpO2: 95% 94% 92%     Intake/Output Summary (Last 24 hours) at 01/02/13 0847 Last data filed at 01/02/13 0600  Gross per 24 hour  Intake   1115 ml  Output      0 ml  Net   1115 ml   Filed Weights   12/31/12 1546 12/31/12 2010 01/01/13 1900  Weight: 75.297 kg (166 lb) 76.567 kg (168 lb 12.8 oz) 79.6 kg (175 lb 7.8 oz)    Exam:   General:  Pt in NAD, Alert and Awake  Cardiovascular: Irregularly regular, no murmurs  Respiratory: CTA BL, no wheezes  Abdomen: soft, NT, ND  Musculoskeletal: no cyanosis or clubbing   Data Reviewed: Basic Metabolic Panel:  Recent Labs Lab 12/31/12 1638 01/01/13 0557 01/01/13 0600 01/02/13 0354  NA 130* 131*  --  129*  K 3.3* 3.2*  --  3.6  CL 91* 96  --  95*  CO2  25 24  --  23  GLUCOSE 278* 226*  --  214*  BUN 24* 26*  --  29*  CREATININE 0.79 0.65  --  0.79  CALCIUM 9.2 8.3*  --  8.2*  MG  --   --  1.9  --   PHOS  --   --  3.5  --    Liver Function Tests:  Recent Labs Lab 01/02/13 0354  AST 18  ALT 19  ALKPHOS 230*  BILITOT 0.5  PROT 6.0  ALBUMIN 2.1*   No results found for this basename: LIPASE, AMYLASE,  in the last 168 hours No results found for this basename: AMMONIA,  in the last 168 hours CBC:  Recent Labs Lab 12/31/12 1638 01/01/13 0557  WBC 24.0* 18.5*  NEUTROABS 21.2*  --   HGB 12.7 10.6*  HCT  36.3 32.2*  MCV 84.4 85.4  PLT 475* 360   Cardiac Enzymes:  Recent Labs Lab 12/31/12 1638  TROPONINI <0.30   BNP (last 3 results)  Recent Labs  01/02/13 0354  PROBNP 6103.0*   CBG:  Recent Labs Lab 01/01/13 0816 01/01/13 1154 01/01/13 1717 01/01/13 2231 01/02/13 0800  GLUCAP 217* 208* 219* 213* 200*    Recent Results (from the past 240 hour(s))  CULTURE, BLOOD (ROUTINE X 2)     Status: None   Collection Time    12/31/12  6:45 PM      Result Value Range Status   Specimen Description BLOOD RIGHT ARM   Final   Special Requests BOTTLES DRAWN AEROBIC AND ANAEROBIC   Final   Culture  Setup Time 12/31/2012 23:44   Final   Culture     Final   Value:        BLOOD CULTURE RECEIVED NO GROWTH TO DATE CULTURE WILL BE HELD FOR 5 DAYS BEFORE ISSUING A FINAL NEGATIVE REPORT   Report Status PENDING   Incomplete  CULTURE, BLOOD (ROUTINE X 2)     Status: None   Collection Time    12/31/12  6:51 PM      Result Value Range Status   Specimen Description BLOOD RIGHT HAND   Final   Special Requests BOTTLES DRAWN AEROBIC AND ANAEROBIC   Final   Culture  Setup Time 12/31/2012 23:43   Final   Culture     Final   Value:        BLOOD CULTURE RECEIVED NO GROWTH TO DATE CULTURE WILL BE HELD FOR 5 DAYS BEFORE ISSUING A FINAL NEGATIVE REPORT   Report Status PENDING   Incomplete  MRSA PCR SCREENING     Status: None   Collection Time    01/01/13  7:21 PM      Result Value Range Status   MRSA by PCR NEGATIVE  NEGATIVE Final   Comment:            The GeneXpert MRSA Assay (FDA     approved for NASAL specimens     only), is one component of a     comprehensive MRSA colonization     surveillance program. It is not     intended to diagnose MRSA     infection nor to guide or     monitor treatment for     MRSA infections.     Studies: Dg Chest Port 1 View  01/01/2013   *RADIOLOGY REPORT*  Clinical Data: Sepsis  PORTABLE CHEST - 1 VIEW  Comparison: None.  Findings: Increased  interstitial markings/perihilar opacities,  possibly reflecting interstitial edema, less likely atypical infection.  No pleural effusion or pneumothorax.  Cardiomegaly.  IMPRESSION: Cardiomegaly with increased interstitial markings/perihilar opacities, favored to reflect interstitial edema.  Given the history, atypical infection is not excluded.   Original Report Authenticated By: Charline Bills, M.D.   Dg Humerus Left  12/31/2012   *RADIOLOGY REPORT*  Clinical Data: Left arm injury.  Cellulitis.  Rule out soft tissue gas.  LEFT HUMERUS - 2+ VIEW  Comparison: None  Findings: No radio-opaque foreign body.    No soft tissue gas.  No osseous destruction.  IMPRESSION: No acute findings.   Original Report Authenticated By: Jeronimo Greaves, M.D.    Scheduled Meds: . aspirin EC  81 mg Oral Daily  . enoxaparin (LOVENOX) injection  80 mg Subcutaneous Q12H  . insulin aspart  0-5 Units Subcutaneous QHS  . insulin aspart  0-9 Units Subcutaneous TID WC  . meloxicam  15 mg Oral Daily  . metoprolol tartrate  25 mg Oral Q8H  . piperacillin-tazobactam (ZOSYN)  IV  3.375 g Intravenous Q8H  . sodium chloride  3 mL Intravenous Q12H  . vancomycin  750 mg Intravenous Once  . vancomycin  750 mg Intravenous Q12H   Continuous Infusions: . diltiazem (CARDIZEM) infusion Stopped (01/01/13 2350)    Principal Problem:   Cellulitis Active Problems:   Hyponatremia   Hypokalemia   Fasting hyperglycemia   Leukocytosis   Hypotension   Atrial flutter   Diabetes mellitus type 2, noninsulin dependent    Time spent: > 45 minutes    Penny Pia  [facility] Pager [phone] 7PM-7AM, please contact night-coverage at [email or web address], password [facility] 01/02/2013, 8:47 AM  LOS: 2 days

## 2013-01-02 NOTE — Progress Notes (Signed)
Converted to NSR @ 23:50 01/01/13

## 2013-01-02 NOTE — Progress Notes (Signed)
  Echocardiogram 2D Echocardiogram has been performed.  Hisham Provence 01/02/2013, 8:29 AM

## 2013-01-02 NOTE — Progress Notes (Signed)
Subjective:  Feeling better today but had an episode earlier where she felt as if applesauce stuck in her throat. She threw up this morning. She does not have any shortness of breath. She converted to sinus rhythm sometime before midnight last night.  Objective:  Vital Signs in the last 24 hours: BP 115/51  Pulse 84  Temp(Src) 98.3 F (36.8 C) (Oral)  Resp 26  Ht 5\' 4"  (1.626 m)  Wt 79.6 kg (175 lb 7.8 oz)  BMI 30.11 kg/m2  SpO2 92%  Physical Exam: Obese white female in no acute distress Lungs:  Reduced breath sounds Cardiac:  Regular rhythm, normal S1 and S2, no S3 Extremities:  Left arm still swollen with some erythema noted.  Intake/Output from previous day: 05/31 0701 - 06/01 0700 In: 1115 [P.O.:240; I.V.:35; IV Piggyback:600] Out: -  Weight Filed Weights   12/31/12 1546 12/31/12 2010 01/01/13 1900  Weight: 75.297 kg (166 lb) 76.567 kg (168 lb 12.8 oz) 79.6 kg (175 lb 7.8 oz)    Lab Results: Basic Metabolic Panel:  Recent Labs  96/04/54 0557 01/02/13 0354  NA 131* 129*  K 3.2* 3.6  CL 96 95*  CO2 24 23  GLUCOSE 226* 214*  BUN 26* 29*  CREATININE 0.65 0.79    CBC:  Recent Labs  12/31/12 1638 01/01/13 0557  WBC 24.0* 18.5*  NEUTROABS 21.2*  --   HGB 12.7 10.6*  HCT 36.3 32.2*  MCV 84.4 85.4  PLT 475* 360    BNP    Component Value Date/Time   PROBNP 6103.0* 01/02/2013 0354    Telemetry: Currently in sinus rhythm this morning  Assessment/Plan:  1. Atrial fibrillation resolved 2. Elevation of BNP that may have been due to rapid atrial fibrillation yesterday-echo pending 3. Cellulitis under treatment 4. Hyponatremia  Recommendations:  Continue beta blockers, review echo later today. May want to watch overnight in the step down to be sure she does not flip back into atrial fibrillation. Currently her diltiazem has been discontinued.     Darden Palmer  MD [facility] Cardiology  01/02/2013, 8:07 AM

## 2013-01-02 NOTE — Progress Notes (Signed)
Antibiotic Brief Note - Zosyn Dosing  A/P: Restart Zosyn 3.375g IV q8 extended interval dosing now that IV Diltiazem has been discontinued. Will monitor renal function.  Hessie Knows, PharmD, BCPS Pager [phone] 01/02/2013 8:30 AM

## 2013-01-03 DIAGNOSIS — E119 Type 2 diabetes mellitus without complications: Secondary | ICD-10-CM

## 2013-01-03 DIAGNOSIS — E871 Hypo-osmolality and hyponatremia: Secondary | ICD-10-CM

## 2013-01-03 LAB — CBC
HCT: 32.6 % — ABNORMAL LOW (ref 36.0–46.0)
Hemoglobin: 11 g/dL — ABNORMAL LOW (ref 12.0–15.0)
MCH: 29.3 pg (ref 26.0–34.0)
MCHC: 33.7 g/dL (ref 30.0–36.0)

## 2013-01-03 LAB — GLUCOSE, CAPILLARY
Glucose-Capillary: 191 mg/dL — ABNORMAL HIGH (ref 70–99)
Glucose-Capillary: 202 mg/dL — ABNORMAL HIGH (ref 70–99)

## 2013-01-03 LAB — BASIC METABOLIC PANEL
BUN: 25 mg/dL — ABNORMAL HIGH (ref 6–23)
Calcium: 8.4 mg/dL (ref 8.4–10.5)
GFR calc non Af Amer: 67 mL/min — ABNORMAL LOW (ref >90)
Glucose, Bld: 215 mg/dL — ABNORMAL HIGH (ref 70–99)

## 2013-01-03 MED ORDER — METOPROLOL TARTRATE 25 MG PO TABS
25.0000 mg | ORAL_TABLET | Freq: Two times a day (BID) | ORAL | Status: DC
  Administered 2013-01-03 – 2013-01-10 (×10): 25 mg via ORAL
  Filled 2013-01-03 (×17): qty 1

## 2013-01-03 MED ORDER — SULFAMETHOXAZOLE-TMP DS 800-160 MG PO TABS
1.0000 | ORAL_TABLET | Freq: Two times a day (BID) | ORAL | Status: DC
  Administered 2013-01-03 – 2013-01-04 (×4): 1 via ORAL
  Filled 2013-01-03 (×6): qty 1

## 2013-01-03 MED ORDER — LIVING WELL WITH DIABETES BOOK
Freq: Once | Status: AC
  Administered 2013-01-03: 09:00:00
  Filled 2013-01-03: qty 1

## 2013-01-03 MED ORDER — VERAPAMIL HCL 80 MG PO TABS
80.0000 mg | ORAL_TABLET | Freq: Three times a day (TID) | ORAL | Status: DC
  Administered 2013-01-03 – 2013-01-06 (×11): 80 mg via ORAL
  Filled 2013-01-03 (×13): qty 1

## 2013-01-03 NOTE — Progress Notes (Signed)
[facility] PROGRESS NOTE  [name] YNW:[id number] DOB: 09/01/1945 DOA: 12/31/2012 PCP: [name], MD  Assessment/Plan: 1. Atrial flutter - Heart Rate has been well controlled on B blocker regimen. Will transition to telemetry. - Potassium within normal limits after replacement. - Consulted cardiology. Would like to thank Dr. Donnie Aho for input  - Most likely related to recent infectious etiology - Magnesium and phosphorus within normal limits. - Most likely cause of elevated BNP  2. Cellulitis - Improving. Patient initially on Vancomycin and Zosyn - Will change regimen to oral regimen. If patient with continued improvement will plan on discharging home next am.  3. Hypokalemia - Resolved after replacement. - Recheck level today. - magnesium within normal limits.  4. Hypotension - Resolved with IVF's and antibiotics  5. Hyponatremia - Based on the lab test yesterday would continue to hold IVF, with lab work concerning for SIADH - Will recheck sodium levels  6. DM II - At this point diabetic coordinator has been consulted - continue SSI - blood sugars in the low 200's - Patient does not have any hypoglycemic agents listed on home medication list.  - Will have to adjust and make recommendations regarding patient's home regimen moving forward.   Code Status: Full code Family Communication: No family at bedside. Disposition Plan: To step down unit   Consultants:  Cardiology  Procedures:  none  Antibiotics:  Vancomycin and Zosyn  HPI/Subjective: Pt has no new complaints.  Denies any chest discomfort or SOB. No acute issues reported overnight.  Objective: Filed Vitals:   01/03/13 0000 01/03/13 0200 01/03/13 0400 01/03/13 0600  BP: 126/70 145/63 124/58 131/76  Pulse: 82 83 78 79  Temp: 97.8 F (36.6 C)  98 F (36.7 C)   TempSrc: Oral  Oral   Resp: 23 23 22 20   Height:      Weight:      SpO2: 94% 90% 92% 90%    Intake/Output Summary  (Last 24 hours) at 01/03/13 0748 Last data filed at 01/03/13 0513  Gross per 24 hour  Intake   1010 ml  Output    500 ml  Net    510 ml   Filed Weights   12/31/12 1546 12/31/12 2010 01/01/13 1900  Weight: 75.297 kg (166 lb) 76.567 kg (168 lb 12.8 oz) 79.6 kg (175 lb 7.8 oz)    Exam:   General:  Pt in NAD, Alert and Awake  Cardiovascular: RRR, no murmurs  Respiratory: CTA BL, no wheezes  Abdomen: soft, NT, ND  Musculoskeletal: no cyanosis or clubbing   Data Reviewed: Basic Metabolic Panel:  Recent Labs Lab 12/31/12 1638 01/01/13 0557 01/01/13 0600 01/02/13 0354  NA 130* 131*  --  129*  K 3.3* 3.2*  --  3.6  CL 91* 96  --  95*  CO2 25 24  --  23  GLUCOSE 278* 226*  --  214*  BUN 24* 26*  --  29*  CREATININE 0.79 0.65  --  0.79  CALCIUM 9.2 8.3*  --  8.2*  MG  --   --  1.9  --   PHOS  --   --  3.5  --    Liver Function Tests:  Recent Labs Lab 01/02/13 0354  AST 18  ALT 19  ALKPHOS 230*  BILITOT 0.5  PROT 6.0  ALBUMIN 2.1*   No results found for this basename: LIPASE, AMYLASE,  in the last 168 hours No results found for this basename: AMMONIA,  in the  last 168 hours CBC:  Recent Labs Lab 12/31/12 1638 01/01/13 0557  WBC 24.0* 18.5*  NEUTROABS 21.2*  --   HGB 12.7 10.6*  HCT 36.3 32.2*  MCV 84.4 85.4  PLT 475* 360   Cardiac Enzymes:  Recent Labs Lab 12/31/12 1638  TROPONINI <0.30   BNP (last 3 results)  Recent Labs  01/02/13 0354  PROBNP 6103.0*   CBG:  Recent Labs Lab 01/01/13 2231 01/02/13 0800 01/02/13 1219 01/02/13 1552 01/02/13 2139  GLUCAP 213* 200* 226* 210* 219*    Recent Results (from the past 240 hour(s))  CULTURE, BLOOD (ROUTINE X 2)     Status: None   Collection Time    12/31/12  6:45 PM      Result Value Range Status   Specimen Description BLOOD RIGHT ARM   Final   Special Requests BOTTLES DRAWN AEROBIC AND ANAEROBIC   Final   Culture  Setup Time 12/31/2012 23:44   Final   Culture     Final   Value:         BLOOD CULTURE RECEIVED NO GROWTH TO DATE CULTURE WILL BE HELD FOR 5 DAYS BEFORE ISSUING A FINAL NEGATIVE REPORT   Report Status PENDING   Incomplete  CULTURE, BLOOD (ROUTINE X 2)     Status: None   Collection Time    12/31/12  6:51 PM      Result Value Range Status   Specimen Description BLOOD RIGHT HAND   Final   Special Requests BOTTLES DRAWN AEROBIC AND ANAEROBIC   Final   Culture  Setup Time 12/31/2012 23:43   Final   Culture     Final   Value:        BLOOD CULTURE RECEIVED NO GROWTH TO DATE CULTURE WILL BE HELD FOR 5 DAYS BEFORE ISSUING A FINAL NEGATIVE REPORT   Report Status PENDING   Incomplete  MRSA PCR SCREENING     Status: None   Collection Time    01/01/13  7:21 PM      Result Value Range Status   MRSA by PCR NEGATIVE  NEGATIVE Final   Comment:            The GeneXpert MRSA Assay (FDA     approved for NASAL specimens     only), is one component of a     comprehensive MRSA colonization     surveillance program. It is not     intended to diagnose MRSA     infection nor to guide or     monitor treatment for     MRSA infections.     Studies: Dg Chest Port 1 View  01/01/2013   *RADIOLOGY REPORT*  Clinical Data: Sepsis  PORTABLE CHEST - 1 VIEW  Comparison: None.  Findings: Increased interstitial markings/perihilar opacities, possibly reflecting interstitial edema, less likely atypical infection.  No pleural effusion or pneumothorax.  Cardiomegaly.  IMPRESSION: Cardiomegaly with increased interstitial markings/perihilar opacities, favored to reflect interstitial edema.  Given the history, atypical infection is not excluded.   Original Report Authenticated By: Charline Bills, M.D.    Scheduled Meds: . aspirin EC  81 mg Oral Daily  . enoxaparin (LOVENOX) injection  80 mg Subcutaneous Q12H  . insulin aspart  0-5 Units Subcutaneous QHS  . insulin aspart  0-9 Units Subcutaneous TID WC  . meloxicam  15 mg Oral Daily  . metoprolol tartrate  25 mg Oral Q8H  .  piperacillin-tazobactam (ZOSYN)  IV  3.375 g Intravenous Q8H  .  sodium chloride  3 mL Intravenous Q12H  . vancomycin  750 mg Intravenous Q12H   Continuous Infusions: . diltiazem (CARDIZEM) infusion Stopped (01/01/13 2350)    Principal Problem:   Cellulitis Active Problems:   Hyponatremia   Hypokalemia   Fasting hyperglycemia   Leukocytosis   Hypotension   Atrial flutter   Diabetes mellitus type 2, noninsulin dependent    Time spent: > 40 minutes    Penny Pia  [facility] Pager [phone] If 7PM-7AM, please contact night-coverage at [email or web address], password [facility] 01/03/2013, 7:48 AM  LOS: 3 days

## 2013-01-03 NOTE — Progress Notes (Signed)
Nutrition Brief Note  RD consulted for nutrition education regarding diabetes.   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 Pt states she knows the sources of carbohydrates. RD provided "Type 2 Diabetes Nutrition Therapy" handout from the [employer] of [employer]. Provided list of carbohydrates and recommended serving sizes of common foods. RD name and contact information was provided. Encouraged pt to contact RD if she thinks of any questions or changes her mind about diet education.  Body mass index is 30.11 kg/(m^2). Pt meets criteria for Obese based on current BMI.  Current diet order is Carb Modified, patient is consuming approximately 50% of meals at this time. Pt states her appetite has been poor for the past week but, pt does not know if she has lost any weight. Pt is not interested in any nutritional supplements at this time but, did request yogurt.  No further nutrition interventions warranted at this time. RD contact information provided. If additional nutrition issues arise, please re-consult RD.  Ian Malkin RD, LDN Inpatient Clinical Dietitian Pager: [phone] After Hours Pager: [phone]

## 2013-01-03 NOTE — Progress Notes (Addendum)
Inpatient Diabetes Program Recommendations  AACE/ADA: New Consensus Statement on Inpatient Glycemic Control (2013)  Target Ranges:  Prepandial:   less than 140 mg/dL      Peak postprandial:   less than 180 mg/dL (1-2 hours)      Critically ill patients:  140 - 180 mg/dL  Results for KAYLY, KRIEGEL (MRN [id number]) as of 01/03/2013 07:50  Ref. Range 12/31/2012 16:38 12/31/2012 18:51 01/01/2013 05:57 01/02/2013 03:54  Hemoglobin A1C Latest Range: <5.7 %  10.6 (H)    Glucose Latest Range: 70-99 mg/dL 629 (H)  528 (H) 413 (H)   Results for MACKIE, GOON (MRN [id number]) as of 01/03/2013 07:50  Ref. Range 01/02/2013 08:00 01/02/2013 12:19 01/02/2013 15:52 01/02/2013 21:39  Glucose-Capillary Latest Range: 70-99 mg/dL 536 (H) 644 (H) 034 (H) 219 (H)    Inpatient Diabetes Program Recommendations Insulin - Basal: If blood glucose continues to be greater than 180 mg/dl, may want to start low dose basal insulin (Levemir 5 units daily). Correction (SSI): Please consider increasing Novolog correction to moderate scale.  Note: According to the chart, patient does not have a prior documented history of diabetes.  A1C on 12/31/12 noted to be 10.6% and blood glucose over the past 24 hours has ranged from 200-226 mg/dl.  Please consider increasing Novolog correction to moderate scale and may want to consider starting low dose basal insulin to improve inpatient glycemic control.  Noted diabetes coordinator consult.  Ordered living well with diabetes booklet, diabetes education videos, and dietician consult.  Plan to call bedside nurse to discuss diabetes education plan and diabetes coordinator will visit with patient to ensure properly educated prior to discharge.    Thanks, Orlando Penner, RN, MSN, CCRN Diabetes Coordinator Inpatient Diabetes Program [phone]      01/03/13@8 :53 - Called and spoke with Amy, RN.  Potential plan is for patient to transfer out of step down unit today to floor.  Requested that  diabetes education begin by bedside RN and diabetes coordinator will visit with patient tomorrow morning.  If plan changes and patient will be discharged today, please call Inpatient Diabetes office [phone]) and request a diabetes coordinator to see patient today.  Thanks, Orlando Penner, RN, MSN, CCRN, Diabetes Coordinator

## 2013-01-03 NOTE — Progress Notes (Signed)
ANTICOAGULATION CONSULT NOTE - follow-up Consult  Pharmacy Consult for Lovenox Indication: atrial fibrillation  No Known Allergies  Patient Measurements: Height: 5\' 4"  (162.6 cm) Weight: 175 lb 7.8 oz (79.6 kg) IBW/kg (Calculated) : 54.7  Vital Signs: Temp: 97.9 F (36.6 C) (06/02 0800) Temp src: Oral (06/02 0800) BP: 131/76 mmHg (06/02 0600) Pulse Rate: 81 (06/02 0800)  Labs:  Recent Labs  12/31/12 1638 01/01/13 0557 01/02/13 0354 01/03/13 0856  HGB 12.7 10.6*  --  11.0*  HCT 36.3 32.2*  --  32.6*  PLT 475* 360  --  404*  CREATININE 0.79 0.65 0.79 0.88  TROPONINI <0.30  --   --   --     Estimated Creatinine Clearance: 65.1 ml/min (by C-G formula based on Cr of 0.88).   Medical History: Past Medical History  Diagnosis Date  . Diabetes mellitus without complication     dx on wed  . Mitral valve prolapse   . Atrial flutter     Medications:  Scheduled:  . aspirin EC  81 mg Oral Daily  . enoxaparin (LOVENOX) injection  80 mg Subcutaneous Q12H  . insulin aspart  0-5 Units Subcutaneous QHS  . insulin aspart  0-9 Units Subcutaneous TID WC  . meloxicam  15 mg Oral Daily  . metoprolol tartrate  25 mg Oral BID  . piperacillin-tazobactam (ZOSYN)  IV  3.375 g Intravenous Q8H  . sodium chloride  3 mL Intravenous Q12H  . vancomycin  750 mg Intravenous Q12H  . verapamil  80 mg Oral Q8H   Infusions:     Assessment: 67 TOF with new onset Afib to start full dose anticoagulation with Lovenox per cards order. Stable CBC and renal function. No reported bleeding  Goal of Therapy:  Anti-Xa level 0.6-1.2 units/ml 4hrs after LMWH dose given Monitor platelets by anticoagulation protocol: Yes  Plan:   Continue Lovenox 1mg /kg (80mg ) SQ q12  CBC q72h  Await plan for oral anticoagulation (CHADS2Vasc = 3)  Juliette Alcide, PharmD, BCPS.   Pager: [phone] 01/03/2013 9:37 AM

## 2013-01-03 NOTE — Progress Notes (Signed)
Subjective:  Feeling better today with no more nausea.  Not SOB, No chest pain. Remains in sinus.  ECHO yesterday suggestive of HCM.  Objective:  Vital Signs in the last 24 hours: BP 131/76  Pulse 79  Temp(Src) 97.9 F (36.6 C) (Oral)  Resp 20  Ht 5\' 4"  (1.626 m)  Wt 79.6 kg (175 lb 7.8 oz)  BMI 30.11 kg/m2  SpO2 90%  Physical Exam: Obese white female in no acute distress Lungs:  Reduced breath sounds Cardiac:  Regular rhythm, normal S1 and S2, no S3 Extremities:  Left arm still swollen with some erythema noted.  Intake/Output from previous day: 06/01 0701 - 06/02 0700 In: 1010 [P.O.:480; IV Piggyback:450] Out: 500 [Urine:500]  Weight Filed Weights   12/31/12 1546 12/31/12 2010 01/01/13 1900  Weight: 75.297 kg (166 lb) 76.567 kg (168 lb 12.8 oz) 79.6 kg (175 lb 7.8 oz)    Lab Results: Basic Metabolic Panel:  Recent Labs  29/56/21 0557 01/02/13 0354  NA 131* 129*  K 3.2* 3.6  CL 96 95*  CO2 24 23  GLUCOSE 226* 214*  BUN 26* 29*  CREATININE 0.65 0.79    CBC:  Recent Labs  12/31/12 1638 01/01/13 0557  WBC 24.0* 18.5*  NEUTROABS 21.2*  --   HGB 12.7 10.6*  HCT 36.3 32.2*  MCV 84.4 85.4  PLT 475* 360   BNP    Component Value Date/Time   PROBNP 6103.0* 01/02/2013 0354    Telemetry: Sinus rhythm  ECHO Hypertrophic cardiomyopathy with mild SAM of anterior leaflet of MV, Moderate to severe LA enlargement  Assessment/Plan:  1. Atrial fibrillation resolved 2. 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 3. Cellulitis under treatment 4. Hyponatremia  Recommendations:  Her HCM likely accounts for the elevated BNP and may be contributing to the atrial fibrillation tendencies.  Her CHADS2VASC score is 3  Age, female and DM. I would change her to verapamil and continue treatment for cellulitis.  OK to go to floor.    Darden Palmer  MD [facility]  01/03/2013, 9:15 AM

## 2013-01-04 DIAGNOSIS — M7989 Other specified soft tissue disorders: Secondary | ICD-10-CM

## 2013-01-04 DIAGNOSIS — I4891 Unspecified atrial fibrillation: Secondary | ICD-10-CM

## 2013-01-04 LAB — GLUCOSE, CAPILLARY
Glucose-Capillary: 172 mg/dL — ABNORMAL HIGH (ref 70–99)
Glucose-Capillary: 150 mg/dL — ABNORMAL HIGH (ref 70–99)
Glucose-Capillary: 137 mg/dL — ABNORMAL HIGH (ref 70–99)
Glucose-Capillary: 163 mg/dL — ABNORMAL HIGH (ref 70–99)

## 2013-01-04 MED ORDER — SODIUM CHLORIDE 1 G PO TABS
1.0000 g | ORAL_TABLET | Freq: Three times a day (TID) | ORAL | Status: AC
  Administered 2013-01-04 – 2013-01-05 (×3): 1 g via ORAL
  Filled 2013-01-04 (×3): qty 1

## 2013-01-04 MED ORDER — DOCUSATE SODIUM 100 MG PO CAPS
100.0000 mg | ORAL_CAPSULE | Freq: Two times a day (BID) | ORAL | Status: DC | PRN
  Administered 2013-01-04 – 2013-01-06 (×2): 100 mg via ORAL
  Filled 2013-01-04: qty 1

## 2013-01-04 NOTE — Care Management Note (Addendum)
    Page 1 of 2   01/13/2013     3:21:04 PM   CARE MANAGEMENT NOTE 01/13/2013  Patient:  [name], [name]   Account Number:  [id number]  Date Initiated:  01/04/2013  Documentation initiated by:  Lanier Clam  Subjective/Objective Assessment:   ADMITTED LUE CELLULITIS.     Action/Plan:   FROM HOME W/FAMILY.HAS PCP,PHARMACY.   Anticipated DC Date:  01/14/2013   Anticipated DC Plan:  SKILLED NURSING FACILITY      DC Planning Services  CM consult      Choice offered to / List presented to:             Status of service:  In process, will continue to follow Medicare Important Message given?  NA - LOS <3 / Initial given by admissions (If response is "NO", the following Medicare IM given date fields will be blank) Date Medicare IM given:   Date Additional Medicare IM given:    Discharge Disposition:    Per UR Regulation:  Reviewed for med. necessity/level of care/duration of stay  If discussed at Long Length of Stay Meetings, dates discussed:   01/13/2013    Comments:  01/13/13 Rheannon Cerney RN,BSN NCM [phone] FOR D/C SNF TOMORROW,LONG TERM IV ABX.SLPOKE TO PATIENT ABOUT LTAC,SHE PLEASANTLY DECLINES.CSW AWARE OF SNF.  [id number] Davis,RN,BSN,CCM: Reviewed the benefits and general side effects of xarelto with patient.  Offered to do assistance program with her but stated that she has just enrolled in the drug program through [place] and that it does cover xarelto.  Patient is planning to go to [facility] home for the next 4-6 weeks for continuation of IV ABX and wound care. [id number] Earlene Plater, RN, BSN, CCM: CHART REVIEWED AND UPDATEDPatient to or pm of [id number] debridement of abcesses and area of cellulitis, hypotensive post op,sepsis, and dvt.  Next chart review due on [id number]. NO DISCHARGE NEEDS PRESENT AT THIS TIME. CASE MANAGEMENT [phone]   01/04/13 Lavon Horn RN,BSN NCM [phone] TRANSFER FROM SDU.?ABSCESS,DOPPLER,SCAN.

## 2013-01-04 NOTE — Progress Notes (Signed)
[facility] PROGRESS NOTE  [name]:[id number] DOB: [birth year] DOA: 12/31/2012 PCP: [name], MD  Brief history: 67 y/o CF with history of DM presenting with LUE cellulitis.  Initially hypotensive has improved after broad spectrum antibiotics and IVF's. Developed atrial flutter and cardiology subsequently consulted.  Atrial flutter/fibrillation controlled with B blocker currently. Patient transitioned from zosyn/vanc to bactrim. WBC slightly up this am.  Ultrasound of LUE to assess for abscesses or DVT has been ordered.  Assessment/Plan: 1. Atrial flutter - Heart Rate has been well controlled on B blocker regimen.  - Potassium within normal limits after replacement.  - Most likely related to recent infectious etiology  - Magnesium and phosphorus within normal limits.  - Cardiology managing Echocardiogram showing mild hypertrophic cardiomyopathy with systolic anterior motion of the MV. - Xarelto being considered for anticoagulation  2. Cellulitis  - Improving. Patient initially on Vancomycin and Zosyn  - WBC slightly elevated this AM. Will continue bactrim for now.   - Obtain ultrasound of upper extremity to see if there are any abscesses or dvt's.  3. Hypokalemia  - Resolved after replacement.  - Recheck level today.  - magnesium within normal limits.   4. Hypotension  - Resolved with IVF's and antibiotics   5. Hyponatremia  - Based on the lab testswould continue to hold IVF, with lab work concerning for SIADH  - continue to monitor - administer salt tablets today  -reassess next am.  6. DM II  - At this point diabetic coordinator has been consulted  - continue SSI  - blood sugars in the low 200's  - Patient does not have any hypoglycemic agents listed on home medication list.  - Will have to adjust and make recommendations regarding patient's home regimen moving forward.   Code Status: Full code  Family Communication: No family at bedside.   Disposition Plan: Transitioned to floor with further imaging study of LUE to assess for abscess  Consultants:  Cardiology Procedures:  none Antibiotics:  Vancomycin and Zosyn 5/30 to 6/2 Bactrim 6/02  HPI/Subjective: Patient with no new complaints.  Denies any SOB or chest pain.   Objective: Filed Vitals:   01/03/13 1424 01/03/13 1641 01/03/13 2021 01/04/13 0445  BP: 125/63 128/53 115/52 114/61  Pulse: 74  75 68  Temp: 97.8 F (36.6 C)  98.6 F (37 C) 98.1 F (36.7 C)  TempSrc: Oral  Oral Oral  Resp: 18  18 19   Height:      Weight:      SpO2: 97%  93% 96%    Intake/Output Summary (Last 24 hours) at 01/04/13 1026 Last data filed at 01/04/13 0225  Gross per 24 hour  Intake    240 ml  Output    700 ml  Net   -460 ml   Filed Weights   12/31/12 1546 12/31/12 2010 01/01/13 1900  Weight: 75.297 kg (166 lb) 76.567 kg (168 lb 12.8 oz) 79.6 kg (175 lb 7.8 oz)    Exam:   General:  Pt in NAD, Alert and awake  Cardiovascular: RRR, no MRG  Respiratory: CTA BL, no wheezes  Abdomen: soft, NT, ND  Musculoskeletal: no cyanosis or clubbing  Skin: There is cellulitis at proximal lateral LUE above elbow. Painful on palpation. No induration. No palpable cords    Data Reviewed: Basic Metabolic Panel:  Recent Labs Lab 12/31/12 1638 01/01/13 0557 01/01/13 0600 01/02/13 0354 01/03/13 0856  NA 130* 131*  --  129* 127*  K 3.3* 3.2*  --  3.6 4.0  CL 91* 96  --  95* 93*  CO2 25 24  --  23 24  GLUCOSE 278* 226*  --  214* 215*  BUN 24* 26*  --  29* 25*  CREATININE 0.79 0.65  --  0.79 0.88  CALCIUM 9.2 8.3*  --  8.2* 8.4  MG  --   --  1.9  --   --   PHOS  --   --  3.5  --   --    Liver Function Tests:  Recent Labs Lab 01/02/13 0354  AST 18  ALT 19  ALKPHOS 230*  BILITOT 0.5  PROT 6.0  ALBUMIN 2.1*   No results found for this basename: LIPASE, AMYLASE,  in the last 168 hours No results found for this basename: AMMONIA,  in the last 168 hours CBC:  Recent  Labs Lab 12/31/12 1638 01/01/13 0557 01/03/13 0856  WBC 24.0* 18.5* 20.4*  NEUTROABS 21.2*  --   --   HGB 12.7 10.6* 11.0*  HCT 36.3 32.2* 32.6*  MCV 84.4 85.4 86.9  PLT 475* 360 404*   Cardiac Enzymes:  Recent Labs Lab 12/31/12 1638  TROPONINI <0.30   BNP (last 3 results)  Recent Labs  01/02/13 0354  PROBNP 6103.0*   CBG:  Recent Labs Lab 01/03/13 0743 01/03/13 1151 01/03/13 1710 01/03/13 2134 01/04/13 0809  GLUCAP 196* 191* 202* 186* 172*    Recent Results (from the past 240 hour(s))  CULTURE, BLOOD (ROUTINE X 2)     Status: None   Collection Time    12/31/12  6:45 PM      Result Value Range Status   Specimen Description BLOOD RIGHT ARM   Final   Special Requests BOTTLES DRAWN AEROBIC AND ANAEROBIC   Final   Culture  Setup Time 12/31/2012 23:44   Final   Culture     Final   Value:        BLOOD CULTURE RECEIVED NO GROWTH TO DATE CULTURE WILL BE HELD FOR 5 DAYS BEFORE ISSUING A FINAL NEGATIVE REPORT   Report Status PENDING   Incomplete  CULTURE, BLOOD (ROUTINE X 2)     Status: None   Collection Time    12/31/12  6:51 PM      Result Value Range Status   Specimen Description BLOOD RIGHT HAND   Final   Special Requests BOTTLES DRAWN AEROBIC AND ANAEROBIC   Final   Culture  Setup Time 12/31/2012 23:43   Final   Culture     Final   Value:        BLOOD CULTURE RECEIVED NO GROWTH TO DATE CULTURE WILL BE HELD FOR 5 DAYS BEFORE ISSUING A FINAL NEGATIVE REPORT   Report Status PENDING   Incomplete  MRSA PCR SCREENING     Status: None   Collection Time    01/01/13  7:21 PM      Result Value Range Status   MRSA by PCR NEGATIVE  NEGATIVE Final   Comment:            The GeneXpert MRSA Assay (FDA     approved for NASAL specimens     only), is one component of a     comprehensive MRSA colonization     surveillance program. It is not     intended to diagnose MRSA     infection nor to guide or     monitor treatment for     MRSA infections.  Studies: No results found.  Scheduled Meds: . aspirin EC  81 mg Oral Daily  . enoxaparin (LOVENOX) injection  80 mg Subcutaneous Q12H  . insulin aspart  0-5 Units Subcutaneous QHS  . insulin aspart  0-9 Units Subcutaneous TID WC  . meloxicam  15 mg Oral Daily  . metoprolol tartrate  25 mg Oral BID  . sodium chloride  3 mL Intravenous Q12H  . sulfamethoxazole-trimethoprim  1 tablet Oral Q12H  . verapamil  80 mg Oral Q8H   Continuous Infusions:   Principal Problem:   Cellulitis Active Problems:   Hyponatremia   Hypokalemia   Fasting hyperglycemia   Leukocytosis   Hypotension   Diabetes mellitus type 2, noninsulin dependent   Hypertrophic cardiomyopathy   Atrial fibrillation    Time spent: > 35 minutes    Penny Pia  [facility] Pager [phone] If 7PM-7AM, please contact night-coverage at [email or web address], password [facility] 01/04/2013, 10:26 AM  LOS: 4 days

## 2013-01-04 NOTE — Progress Notes (Addendum)
*  Preliminary Results* Left upper extremity venous duplex completed. Left upper extremity is positive for subacute/indeterminate age deep vein thrombosis involving a single left brachial vein. Preliminary results discussed with Darcel Bayley, RN.  01/04/2013 3:53 PM Gertie Fey, RDMS, RDCS

## 2013-01-04 NOTE — Progress Notes (Signed)
Inpatient Diabetes Program Recommendations  AACE/ADA: New Consensus Statement on Inpatient Glycemic Control (2013)  Target Ranges:  Prepandial:   less than 140 mg/dL      Peak postprandial:   less than 180 mg/dL (1-2 hours)      Critically ill patients:  140 - 180 mg/dL   Reason for Visit: Hyperglycemia  RN states pt in significant pain.  Positive for DVT involving L brachial vein. CBGs well-controlled today although pt hasn't eaten very much.  Results for MAGDELENA, KINSELLA (MRN [id number]) as of 01/04/2013 17:04  Ref. Range 12/31/2012 18:51  Hemoglobin A1C Latest Range: <5.7 % 10.6 (H)  Results for ALEXAS, BASULTO (MRN [id number]) as of 01/04/2013 17:04  Ref. Range 01/04/2013 08:09 01/04/2013 11:44 01/04/2013 16:36  Glucose-Capillary Latest Range: 70-99 mg/dL 782 (H) 956 (H) 213 (H)    Inpatient Diabetes Program Recommendations Insulin - Basal: If blood glucose continues to be greater than 180 mg/dl, may want to start low dose basal insulin (Levemir 5 units daily). Correction (SSI): Please consider increasing Novolog correction to moderate scale.  Note: Will attempt to discuss new diagnosis of DM when pt feeling better.   F/U in am.  Thank you. Ailene Ards, RD, LDN, CDE Inpatient Diabetes Coordinator [phone]

## 2013-01-04 NOTE — Progress Notes (Signed)
Subjective:  Still with some swelling and tenderness of her arm. Mild SOB.  No chest pain.  No recurrence of a fib.   Objective:  Vital Signs in the last 24 hours: BP 114/61  Pulse 68  Temp(Src) 98.1 F (36.7 C) (Oral)  Resp 19  Ht 5\' 4"  (1.626 m)  Wt 79.6 kg (175 lb 7.8 oz)  BMI 30.11 kg/m2  SpO2 96%  Physical Exam: Obese white female in no acute distress Lungs:  Reduced breath sounds Cardiac:  Regular rhythm, normal S1 and S2, no S3 Extremities:  Left arm still swollen with some erythema noted.  Intake/Output from previous day: 06/02 0701 - 06/03 0700 In: 517.5 [P.O.:480; IV Piggyback:37.5] Out: 1400 [Urine:1400]  Weight Filed Weights   12/31/12 1546 12/31/12 2010 01/01/13 1900  Weight: 75.297 kg (166 lb) 76.567 kg (168 lb 12.8 oz) 79.6 kg (175 lb 7.8 oz)    Lab Results: Basic Metabolic Panel:  Recent Labs  16/10/96 0354 01/03/13 0856  NA 129* 127*  K 3.6 4.0  CL 95* 93*  CO2 23 24  GLUCOSE 214* 215*  BUN 29* 25*  CREATININE 0.79 0.88    CBC:  Recent Labs  01/03/13 0856  WBC 20.4*  HGB 11.0*  HCT 32.6*  MCV 86.9  PLT 404*   BNP    Component Value Date/Time   PROBNP 6103.0* 01/02/2013 0354    Telemetry: Sinus rhythm  ECHO Hypertrophic cardiomyopathy with mild SAM of anterior leaflet of MV, Moderate to severe LA enlargement  Assessment/Plan:  1. Atrial fibrillation resolved 2. 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 3. Cellulitis under treatment white count still up.  One would wonder whether she has an abcess in her arm.  4. Hyponatremia  Recommendations:  Still with significant pain and leukocytosis. I would change her to Xarelto once we know nothing further would need to be done with her arm.  Darden Palmer  MD [facility]  01/04/2013, 8:41 AM

## 2013-01-05 ENCOUNTER — Inpatient Hospital Stay (HOSPITAL_COMMUNITY): Payer: Medicare Other

## 2013-01-05 LAB — BASIC METABOLIC PANEL
BUN: 24 mg/dL — ABNORMAL HIGH (ref 6–23)
Chloride: 90 mEq/L — ABNORMAL LOW (ref 96–112)
Creatinine, Ser: 0.73 mg/dL (ref 0.50–1.10)
GFR calc Af Amer: >90 mL/min (ref >90)
GFR calc non Af Amer: 88 mL/min — ABNORMAL LOW (ref >90)

## 2013-01-05 LAB — GLUCOSE, CAPILLARY
Glucose-Capillary: 187 mg/dL — ABNORMAL HIGH (ref 70–99)
Glucose-Capillary: 171 mg/dL — ABNORMAL HIGH (ref 70–99)
Glucose-Capillary: 168 mg/dL — ABNORMAL HIGH (ref 70–99)

## 2013-01-05 MED ORDER — RIVAROXABAN 20 MG PO TABS
20.0000 mg | ORAL_TABLET | Freq: Every day | ORAL | Status: DC
  Filled 2013-01-05: qty 1

## 2013-01-05 MED ORDER — RIVAROXABAN 20 MG PO TABS: 20.0000 mg | ORAL_TABLET | Freq: Every day | ORAL | Status: DC

## 2013-01-05 MED ORDER — CLINDAMYCIN PHOSPHATE 600 MG/50ML IV SOLN
600.0000 mg | Freq: Three times a day (TID) | INTRAVENOUS | Status: DC
  Administered 2013-01-05 – 2013-01-07 (×7): 600 mg via INTRAVENOUS
  Filled 2013-01-05 (×8): qty 50

## 2013-01-05 MED ORDER — RIVAROXABAN 15 MG PO TABS
15.0000 mg | ORAL_TABLET | Freq: Two times a day (BID) | ORAL | Status: DC
  Administered 2013-01-05: 15 mg via ORAL
  Filled 2013-01-05 (×4): qty 1

## 2013-01-05 MED ORDER — POLYETHYLENE GLYCOL 3350 17 G PO PACK
17.0000 g | PACK | Freq: Two times a day (BID) | ORAL | Status: DC
  Administered 2013-01-05 – 2013-01-14 (×10): 17 g via ORAL
  Filled 2013-01-05 (×26): qty 1

## 2013-01-05 MED ORDER — GADOBENATE DIMEGLUMINE 529 MG/ML IV SOLN
15.0000 mL | Freq: Once | INTRAVENOUS | Status: AC | PRN
  Administered 2013-01-05: 15 mL via INTRAVENOUS

## 2013-01-05 MED ORDER — ACETAMINOPHEN 325 MG PO TABS
ORAL_TABLET | ORAL | Status: AC
  Filled 2013-01-05: qty 2

## 2013-01-05 MED ORDER — FLEET ENEMA 7-19 GM/118ML RE ENEM
1.0000 | ENEMA | Freq: Every day | RECTAL | Status: DC | PRN
  Administered 2013-01-08 – 2013-01-11 (×2): 1 via RECTAL
  Filled 2013-01-05 (×3): qty 1

## 2013-01-05 MED ORDER — ACETAMINOPHEN 325 MG PO TABS
650.0000 mg | ORAL_TABLET | Freq: Four times a day (QID) | ORAL | Status: DC | PRN
  Administered 2013-01-05: 650 mg via ORAL

## 2013-01-05 NOTE — Progress Notes (Addendum)
Patient has not had a BM for several days.  Yesterday patient stated she has felt constipated, the sennakot she had PRN was not helping and she wanted a laxative to assist her in having a BM.  6/3 MD gave order for colace; patient still did not have a BM.  Today Dr. Elvera Lennox ordered for pt to have miralax; administered to patient with no results.  Patient encouraged to ambulate; she only agreed to walk up and down hallway x1. Patient requested enema, but then upon RN entering room to administer enema, patient refused and stated she wanted it later.  Patient has been educated regarding positive effects of ambulation upon constipation and importance of having a bowel movement.  Will continue to monitor patient.

## 2013-01-05 NOTE — Progress Notes (Addendum)
ANTICOAGULATION CONSULT NOTE - Initial Consult  Pharmacy Consult for Xarelto Indication: atrial fibrillation, DVT  No Known Allergies  Patient Measurements: Height: 5\' 4"  (162.6 cm) Weight: 175 lb 7.8 oz (79.6 kg) IBW/kg (Calculated) : 54.7  Vital Signs: Temp: 98.1 F (36.7 C) (06/04 0440) Temp src: Oral (06/04 0440) BP: 113/74 mmHg (06/04 0440) Pulse Rate: 69 (06/04 0440)  Labs:  Recent Labs  01/03/13 0856 01/05/13 0510  HGB 11.0*  --   HCT 32.6*  --   PLT 404*  --   CREATININE 0.88 0.73    Estimated Creatinine Clearance: 71.6 ml/min (by C-G formula based on Cr of 0.73).   Medical History: Past Medical History  Diagnosis Date  . Diabetes mellitus without complication     dx on wed  . Mitral valve prolapse   . Atrial flutter      Assessment: 71 yoF with new onset atrial fibrillation and LUE DVT noted on venous Doppler.  Patient was started on Lovenox for afib on 6/2, now cardiology c/s recommending switch to Xarelto due to DVT.  Renal function stable, CrCl~71 ml/min.  CBC has been stable.  Patient received 80 mg dose of Lovenox at 0627 this AM.  Will schedule Xarelto to begin 1 hour prior to what would have been the next scheduled dose of Lovenox.  Goal of Therapy:  Treatment of DVT Monitor platelets by anticoagulation protocol: Yes   Plan:  Lovenox discontinued.  Start Xarelto at [employer].  Xarelto 15 mg PO twice daily with meals x 21 days (6/4-6/25) then adjust dosing to 20 mg once daily with evening meal starting on 6/26.  Clance Boll 01/05/2013,9:24 AM

## 2013-01-05 NOTE — Progress Notes (Signed)
[facility] PROGRESS NOTE  THEODOSIA BAHENA EAV:[id number] DOB: 09/28/1945 DOA: 12/31/2012 PCP: [name], MD  Brief history: 67 y/o CF with history of DM presenting with LUE cellulitis.  Initially hypotensive has improved after broad spectrum antibiotics and IVF's. Developed atrial flutter and cardiology subsequently consulted.  Atrial flutter/fibrillation controlled with B blocker currently. Patient transitioned from zosyn/vanc to bactrim. WBC slightly up this am.  Ultrasound of LUE to assess for abscesses or DVT has been ordered.  Assessment/Plan:  Cellulitis  - Patient initially on Vancomycin and Zosyn, improving, however getting worse after switching to Bactrim (which she was as an outpatient prior to hospitalization). Change to Clindamycin today and monitor. - check CBC in am.   Left arm DVT - started Xarelto today  Atrial flutter - Heart Rate has been well controlled on B blocker regimen.  - started Xarelto today - cardiology following, appreciate input.   Hyponatremia  - Based on the lab test swould continue to hold IVF, with lab work concerning for SIADH, although not typical. Fluid restriction today.  - also up 9 pounds, which does not reflect in I&O in Epic, clinically looks euvolemic but concerned about some degree of volume overload given weight. Strict I&O today. - TSH borderline, normal free T4; check cortisol in am.   Constipation - enema today.   Hypokalemia  - Resolved    Hypotension  - Resolved with IVF's and antibiotics   DM II  - At this point diabetic coordinator has been consulted  - continue SSI  - blood sugars in the low 200's  - Patient does not have any hypoglycemic agents listed on home medication list.  - Will have to adjust and make recommendations regarding patient's home regimen moving forward.   Code Status: Full code  Family Communication: No family at bedside.  Disposition Plan: home when medically stable.   Consultants:   Cardiology Procedures:  none Antibiotics:  Vancomycin and Zosyn 5/30 to 6/2 Bactrim 6/02 - 6/4 Clindamycin 6/4 >>   HPI/Subjective: - subjectively reports worsening of her arm cellulitis. Feels warmer today for her  Objective: Filed Vitals:   01/04/13 0445 01/04/13 1348 01/04/13 2036 01/05/13 0440  BP: 114/61 125/59 120/61 113/74  Pulse: 68 68 76 69  Temp: 98.1 F (36.7 C) 97.7 F (36.5 C) 98.1 F (36.7 C) 98.1 F (36.7 C)  TempSrc: Oral Oral Oral Oral  Resp: 19 18 18 18   Height:      Weight:      SpO2: 96% 97% 94% 95%    Intake/Output Summary (Last 24 hours) at 01/05/13 0741 Last data filed at 01/04/13 1850  Gross per 24 hour  Intake    240 ml  Output    300 ml  Net    -60 ml   Filed Weights   12/31/12 1546 12/31/12 2010 01/01/13 1900  Weight: 75.297 kg (166 lb) 76.567 kg (168 lb 12.8 oz) 79.6 kg (175 lb 7.8 oz)    Exam:   General:  Pt in NAD, Alert and awake  Cardiovascular: RRR, no MRG  Respiratory: CTA BL, no wheezes  Abdomen: soft, NT, ND  Musculoskeletal: no cyanosis or clubbing  Skin: There is cellulitis at proximal lateral LUE above elbow. Painful on palpation. No induration. No palpable cords. Margins marked likely in the ED and looks better overall; hard to appreciate improvement.   Data Reviewed: Basic Metabolic Panel:  Recent Labs Lab 12/31/12 1638 01/01/13 0557 01/01/13 0600 01/02/13 0354 01/03/13 0856 01/05/13 0510  NA 130*  131*  --  129* 127* 123*  K 3.3* 3.2*  --  3.6 4.0 4.1  CL 91* 96  --  95* 93* 90*  CO2 25 24  --  23 24 22   GLUCOSE 278* 226*  --  214* 215* 206*  BUN 24* 26*  --  29* 25* 24*  CREATININE 0.79 0.65  --  0.79 0.88 0.73  CALCIUM 9.2 8.3*  --  8.2* 8.4 8.5  MG  --   --  1.9  --   --   --   PHOS  --   --  3.5  --   --   --    Liver Function Tests:  Recent Labs Lab 01/02/13 0354  AST 18  ALT 19  ALKPHOS 230*  BILITOT 0.5  PROT 6.0  ALBUMIN 2.1*   No results found for this basename: LIPASE,  AMYLASE,  in the last 168 hours No results found for this basename: AMMONIA,  in the last 168 hours CBC:  Recent Labs Lab 12/31/12 1638 01/01/13 0557 01/03/13 0856  WBC 24.0* 18.5* 20.4*  NEUTROABS 21.2*  --   --   HGB 12.7 10.6* 11.0*  HCT 36.3 32.2* 32.6*  MCV 84.4 85.4 86.9  PLT 475* 360 404*   Cardiac Enzymes:  Recent Labs Lab 12/31/12 1638  TROPONINI <0.30   BNP (last 3 results)  Recent Labs  01/02/13 0354  PROBNP 6103.0*   CBG:  Recent Labs Lab 01/03/13 2134 01/04/13 0809 01/04/13 1144 01/04/13 1636 01/04/13 2143  GLUCAP 186* 172* 150* 137* 163*    Recent Results (from the past 240 hour(s))  CULTURE, BLOOD (ROUTINE X 2)     Status: None   Collection Time    12/31/12  6:45 PM      Result Value Range Status   Specimen Description BLOOD RIGHT ARM   Final   Special Requests BOTTLES DRAWN AEROBIC AND ANAEROBIC   Final   Culture  Setup Time 12/31/2012 23:44   Final   Culture     Final   Value:        BLOOD CULTURE RECEIVED NO GROWTH TO DATE CULTURE WILL BE HELD FOR 5 DAYS BEFORE ISSUING A FINAL NEGATIVE REPORT   Report Status PENDING   Incomplete  CULTURE, BLOOD (ROUTINE X 2)     Status: None   Collection Time    12/31/12  6:51 PM      Result Value Range Status   Specimen Description BLOOD RIGHT HAND   Final   Special Requests BOTTLES DRAWN AEROBIC AND ANAEROBIC   Final   Culture  Setup Time 12/31/2012 23:43   Final   Culture     Final   Value:        BLOOD CULTURE RECEIVED NO GROWTH TO DATE CULTURE WILL BE HELD FOR 5 DAYS BEFORE ISSUING A FINAL NEGATIVE REPORT   Report Status PENDING   Incomplete  MRSA PCR SCREENING     Status: None   Collection Time    01/01/13  7:21 PM      Result Value Range Status   MRSA by PCR NEGATIVE  NEGATIVE Final   Comment:            The GeneXpert MRSA Assay (FDA     approved for NASAL specimens     only), is one component of a     comprehensive MRSA colonization     surveillance program. It is not  intended to diagnose MRSA     infection nor to guide or     monitor treatment for     MRSA infections.     Studies: No results found.  Scheduled Meds: . aspirin EC  81 mg Oral Daily  . enoxaparin (LOVENOX) injection  80 mg Subcutaneous Q12H  . insulin aspart  0-5 Units Subcutaneous QHS  . insulin aspart  0-9 Units Subcutaneous TID WC  . meloxicam  15 mg Oral Daily  . metoprolol tartrate  25 mg Oral BID  . sodium chloride  3 mL Intravenous Q12H  . sodium chloride  1 g Oral TID WC  . sulfamethoxazole-trimethoprim  1 tablet Oral Q12H  . verapamil  80 mg Oral Q8H   Continuous Infusions:   Principal Problem:   Cellulitis Active Problems:   Hyponatremia   Hypokalemia   Fasting hyperglycemia   Leukocytosis   Hypotension   Diabetes mellitus type 2, noninsulin dependent   Hypertrophic cardiomyopathy   Atrial fibrillation  Time spent: > 35 minutes  Pamella Pert  [facility] Pager [phone] If 7PM-7AM, please contact night-coverage at [email or web address], password [facility] 01/05/2013, 7:41 AM  LOS: 5 days

## 2013-01-05 NOTE — Progress Notes (Signed)
Patient has had no results with Senna and Colace.  Pt requesting something else to assist with bowel movements.  Will continue to monitor pt.

## 2013-01-05 NOTE — Progress Notes (Signed)
Subjective:  Continues with swelling and tenderness of her left arm. She has had no recurrence of atrial fibrillation. Not short of breath. She had DVT noted on her venous Doppler. She is also having constipation but has not had a bowel movement since she has been admitted and this predated verapamil  Objective:  Vital Signs in the last 24 hours: BP 113/74  Pulse 69  Temp(Src) 98.1 F (36.7 C) (Oral)  Resp 18  Ht 5\' 4"  (1.626 m)  Wt 79.6 kg (175 lb 7.8 oz)  BMI 30.11 kg/m2  SpO2 95%  Physical Exam: Obese white female in no acute distress Lungs:  Reduced breath sounds Cardiac:  Regular rhythm, normal S1 and S2, no S3 Extremities:  Left arm still swollen with some erythema noted. Doesn't appear to have changed much over the past several days.  Intake/Output from previous day: 06/03 0701 - 06/04 0700 In: 240 [P.O.:240] Out: 300 [Urine:300]  Weight Filed Weights   12/31/12 1546 12/31/12 2010 01/01/13 1900  Weight: 75.297 kg (166 lb) 76.567 kg (168 lb 12.8 oz) 79.6 kg (175 lb 7.8 oz)    Lab Results: Basic Metabolic Panel:  Recent Labs  16/10/96 0856 01/05/13 0510  NA 127* 123*  K 4.0 4.1  CL 93* 90*  CO2 24 22  GLUCOSE 215* 206*  BUN 25* 24*  CREATININE 0.88 0.73    CBC:  Recent Labs  01/03/13 0856  WBC 20.4*  HGB 11.0*  HCT 32.6*  MCV 86.9  PLT 404*   BNP    Component Value Date/Time   PROBNP 6103.0* 01/02/2013 0354    Telemetry: Sinus rhythm  ECHO Hypertrophic cardiomyopathy with mild SAM of anterior leaflet of MV, Moderate to severe LA enlargement  Assessment/Plan:  1. Atrial fibrillation resolved 2. 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 3. Cellulitis under treatment white count still up.   4. Hyponatremia that is significant at this time. 5. Constipation  Recommendations:  With known DVT I would go ahead and change her to [facility] for anticoagulation. Keep watching the constipation. It predated verapamil  but this is a known side effect of verapamil and would consider changing to diltiazem if this persists. Need workup for hyponatremia.  Darden Palmer  MD [facility]  01/05/2013, 8:53 AM

## 2013-01-06 ENCOUNTER — Encounter (HOSPITAL_COMMUNITY)
Admission: EM | Disposition: A | Discharge status: Skilled Nursing Facility | Payer: Self-pay | Source: Home / Self Care | Attending: Family Medicine

## 2013-01-06 ENCOUNTER — Encounter (HOSPITAL_COMMUNITY): Payer: Self-pay | Admitting: Anesthesiology

## 2013-01-06 ENCOUNTER — Inpatient Hospital Stay (HOSPITAL_COMMUNITY): Payer: Medicare Other | Admitting: Anesthesiology

## 2013-01-06 ENCOUNTER — Telehealth (HOSPITAL_COMMUNITY): Payer: Self-pay | Admitting: Emergency Medicine

## 2013-01-06 HISTORY — PX: I & D EXTREMITY: SHX5045

## 2013-01-06 HISTORY — PX: APPLICATION OF WOUND VAC: SHX5189

## 2013-01-06 LAB — CBC
Hemoglobin: 10.2 g/dL — ABNORMAL LOW (ref 12.0–15.0)
MCH: 28 pg (ref 26.0–34.0)
RBC: 3.64 MIL/uL — ABNORMAL LOW (ref 3.87–5.11)

## 2013-01-06 LAB — BASIC METABOLIC PANEL
CO2: 23 mEq/L (ref 19–32)
Chloride: 92 mEq/L — ABNORMAL LOW (ref 96–112)
Glucose, Bld: 211 mg/dL — ABNORMAL HIGH (ref 70–99)
Potassium: 4.5 mEq/L (ref 3.5–5.1)
Sodium: 125 mEq/L — ABNORMAL LOW (ref 135–145)

## 2013-01-06 LAB — CULTURE, BLOOD (ROUTINE X 2)

## 2013-01-06 LAB — GLUCOSE, CAPILLARY
Glucose-Capillary: 177 mg/dL — ABNORMAL HIGH (ref 70–99)
Glucose-Capillary: 214 mg/dL — ABNORMAL HIGH (ref 70–99)
Glucose-Capillary: 137 mg/dL — ABNORMAL HIGH (ref 70–99)
Glucose-Capillary: 136 mg/dL — ABNORMAL HIGH (ref 70–99)

## 2013-01-06 LAB — SURGICAL PCR SCREEN: Staphylococcus aureus: NEGATIVE

## 2013-01-06 SURGERY — IRRIGATION AND DEBRIDEMENT EXTREMITY
Anesthesia: General | Site: Elbow | Laterality: Left | Wound class: Dirty or Infected

## 2013-01-06 MED ORDER — ONDANSETRON HCL 4 MG PO TABS: 4.0000 mg | ORAL_TABLET | Freq: Four times a day (QID) | ORAL | Status: DC | PRN

## 2013-01-06 MED ORDER — LIDOCAINE HCL (PF) 2 % IJ SOLN
INTRAMUSCULAR | Status: DC | PRN
  Administered 2013-01-06: 30 mg

## 2013-01-06 MED ORDER — ONDANSETRON HCL 4 MG/2ML IJ SOLN
INTRAMUSCULAR | Status: DC | PRN
  Administered 2013-01-06: 4 mg via INTRAVENOUS

## 2013-01-06 MED ORDER — PHENYLEPHRINE HCL 10 MG/ML IJ SOLN
INTRAMUSCULAR | Status: DC | PRN
  Administered 2013-01-06: 80 ug via INTRAVENOUS
  Administered 2013-01-06: 40 ug via INTRAVENOUS
  Administered 2013-01-06: 80 ug via INTRAVENOUS
  Administered 2013-01-06: 40 ug via INTRAVENOUS

## 2013-01-06 MED ORDER — PHENYLEPHRINE HCL 10 MG/ML IJ SOLN
10.0000 mg | INTRAVENOUS | Status: DC | PRN
  Administered 2013-01-06: 50 ug/min via INTRAVENOUS

## 2013-01-06 MED ORDER — OXYCODONE HCL 5 MG PO TABS
5.0000 mg | ORAL_TABLET | ORAL | Status: DC | PRN
Start: 2013-01-06 — End: 2013-01-17
  Administered 2013-01-07: 10 mg via ORAL
  Administered 2013-01-07 – 2013-01-08 (×3): 5 mg via ORAL
  Administered 2013-01-09 (×4): 10 mg via ORAL
  Administered 2013-01-11: 5 mg via ORAL
  Administered 2013-01-11 – 2013-01-12 (×5): 10 mg via ORAL
  Filled 2013-01-06: qty 2
  Filled 2013-01-06: qty 1
  Filled 2013-01-06 (×8): qty 2
  Filled 2013-01-06 (×2): qty 1
  Filled 2013-01-06: qty 2
  Filled 2013-01-06: qty 1
  Filled 2013-01-06: qty 2

## 2013-01-06 MED ORDER — METHOCARBAMOL 500 MG PO TABS
500.0000 mg | ORAL_TABLET | Freq: Four times a day (QID) | ORAL | Status: DC | PRN
  Administered 2013-01-07 – 2013-01-14 (×9): 500 mg via ORAL
  Filled 2013-01-06 (×9): qty 1

## 2013-01-06 MED ORDER — HYDROMORPHONE HCL PF 1 MG/ML IJ SOLN
0.2500 mg | INTRAMUSCULAR | Status: DC | PRN
  Administered 2013-01-06 (×3): 0.5 mg via INTRAVENOUS

## 2013-01-06 MED ORDER — ONDANSETRON HCL 4 MG/2ML IJ SOLN
4.0000 mg | Freq: Four times a day (QID) | INTRAMUSCULAR | Status: DC | PRN
  Administered 2013-01-07 – 2013-01-15 (×4): 4 mg via INTRAVENOUS
  Filled 2013-01-06: qty 2

## 2013-01-06 MED ORDER — LACTATED RINGERS IV SOLN
INTRAVENOUS | Status: DC | PRN
  Administered 2013-01-06 (×2): via INTRAVENOUS

## 2013-01-06 MED ORDER — SODIUM CHLORIDE 0.9 % IR SOLN
Status: DC | PRN
  Administered 2013-01-06: 9000 mL

## 2013-01-06 MED ORDER — PROMETHAZINE HCL 25 MG/ML IJ SOLN: 6.2500 mg | INTRAMUSCULAR | Status: DC | PRN

## 2013-01-06 MED ORDER — METHOCARBAMOL 100 MG/ML IJ SOLN
500.0000 mg | Freq: Four times a day (QID) | INTRAVENOUS | Status: DC | PRN
  Administered 2013-01-06 – 2013-01-11 (×5): 500 mg via INTRAVENOUS
  Filled 2013-01-06 (×5): qty 5

## 2013-01-06 MED ORDER — VANCOMYCIN HCL IN DEXTROSE 1-5 GM/200ML-% IV SOLN
1000.0000 mg | Freq: Two times a day (BID) | INTRAVENOUS | Status: DC
  Administered 2013-01-06 – 2013-01-09 (×6): 1000 mg via INTRAVENOUS
  Filled 2013-01-06 (×9): qty 200

## 2013-01-06 MED ORDER — PROPOFOL 10 MG/ML IV BOLUS
INTRAVENOUS | Status: DC | PRN
  Administered 2013-01-06: 120 mg via INTRAVENOUS

## 2013-01-06 MED ORDER — METOCLOPRAMIDE HCL 10 MG PO TABS: 5.0000 mg | ORAL_TABLET | Freq: Three times a day (TID) | ORAL | Status: DC | PRN

## 2013-01-06 MED ORDER — FENTANYL CITRATE 0.05 MG/ML IJ SOLN
INTRAMUSCULAR | Status: DC | PRN
  Administered 2013-01-06: 100 ug via INTRAVENOUS
  Administered 2013-01-06 (×3): 50 ug via INTRAVENOUS

## 2013-01-06 MED ORDER — MIDAZOLAM HCL 5 MG/5ML IJ SOLN
INTRAMUSCULAR | Status: DC | PRN
  Administered 2013-01-06: 2 mg via INTRAVENOUS

## 2013-01-06 MED ORDER — SUCCINYLCHOLINE CHLORIDE 20 MG/ML IJ SOLN
INTRAMUSCULAR | Status: DC | PRN
  Administered 2013-01-06: 100 mg via INTRAVENOUS

## 2013-01-06 MED ORDER — METOCLOPRAMIDE HCL 5 MG/ML IJ SOLN: 5.0000 mg | Freq: Three times a day (TID) | INTRAMUSCULAR | Status: DC | PRN

## 2013-01-06 MED ORDER — EPHEDRINE SULFATE 50 MG/ML IJ SOLN
INTRAMUSCULAR | Status: DC | PRN
  Administered 2013-01-06 (×3): 10 mg via INTRAVENOUS

## 2013-01-06 MED ORDER — LACTATED RINGERS IV SOLN
INTRAVENOUS | Status: DC
  Administered 2013-01-06: 23:00:00 via INTRAVENOUS

## 2013-01-06 MED ORDER — 0.9 % SODIUM CHLORIDE (POUR BTL) OPTIME
TOPICAL | Status: DC | PRN
  Administered 2013-01-06: 1000 mL

## 2013-01-06 SURGICAL SUPPLY — 42 items
BAG SPEC THK2 15X12 ZIP CLS (MISCELLANEOUS) ×6
BAG ZIPLOCK 12X15 (MISCELLANEOUS) ×7 IMPLANT
BANDAGE ELASTIC 4 VELCRO ST LF (GAUZE/BANDAGES/DRESSINGS) ×3 IMPLANT
BANDAGE ELASTIC 6 VELCRO ST LF (GAUZE/BANDAGES/DRESSINGS) ×2 IMPLANT
BANDAGE ESMARK 6X9 LF (GAUZE/BANDAGES/DRESSINGS) ×1 IMPLANT
BANDAGE GAUZE ELAST BULKY 4 IN (GAUZE/BANDAGES/DRESSINGS) ×5 IMPLANT
BNDG CMPR 9X4 STRL LF SNTH (GAUZE/BANDAGES/DRESSINGS) ×2
BNDG CMPR 9X6 STRL LF SNTH (GAUZE/BANDAGES/DRESSINGS)
BNDG ESMARK 4X9 LF (GAUZE/BANDAGES/DRESSINGS) ×1 IMPLANT
BNDG ESMARK 6X9 LF (GAUZE/BANDAGES/DRESSINGS)
CLOTH BEACON ORANGE TIMEOUT ST (SAFETY) ×3 IMPLANT
CUFF TOURN SGL QUICK 18 (TOURNIQUET CUFF) ×1 IMPLANT
CUFF TOURN SGL QUICK 24 (TOURNIQUET CUFF)
CUFF TOURN SGL QUICK 34 (TOURNIQUET CUFF)
CUFF TRNQT CYL 24X4X40X1 (TOURNIQUET CUFF) IMPLANT
CUFF TRNQT CYL 34X4X40X1 (TOURNIQUET CUFF) IMPLANT
DRAIN PENROSE 18X1/2 LTX STRL (DRAIN) ×1 IMPLANT
DRAPE U-SHAPE 47X51 STRL (DRAPES) ×2 IMPLANT
DRSG PAD ABDOMINAL 8X10 ST (GAUZE/BANDAGES/DRESSINGS) ×6 IMPLANT
DRSG VAC ATS MED SENSATRAC (GAUZE/BANDAGES/DRESSINGS) ×2 IMPLANT
DRSG VAC ATS SM SENSATRAC (GAUZE/BANDAGES/DRESSINGS) ×1 IMPLANT
DURAPREP 26ML APPLICATOR (WOUND CARE) ×2 IMPLANT
ELECT REM PT RETURN 9FT ADLT (ELECTROSURGICAL) ×3
ELECTRODE REM PT RTRN 9FT ADLT (ELECTROSURGICAL) ×2 IMPLANT
GLOVE SURG ORTHO 8.0 STRL STRW (GLOVE) ×4 IMPLANT
GOWN STRL NON-REIN LRG LVL3 (GOWN DISPOSABLE) ×3 IMPLANT
GOWN STRL REIN 2XL LVL4 (GOWN DISPOSABLE) ×1 IMPLANT
HANDPIECE INTERPULSE COAX TIP (DISPOSABLE) ×3
IV NS IRRIG 3000ML ARTHROMATIC (IV SOLUTION) ×3 IMPLANT
KIT BASIN OR (CUSTOM PROCEDURE TRAY) ×3 IMPLANT
NS IRRIG 1000ML POUR BTL (IV SOLUTION) ×2 IMPLANT
PACK LOWER EXTREMITY WL (CUSTOM PROCEDURE TRAY) ×3 IMPLANT
PAD CAST 4YDX4 CTTN HI CHSV (CAST SUPPLIES) ×2 IMPLANT
PADDING CAST COTTON 4X4 STRL (CAST SUPPLIES)
POSITIONER SURGICAL ARM (MISCELLANEOUS) ×3 IMPLANT
SET HNDPC FAN SPRY TIP SCT (DISPOSABLE) ×2 IMPLANT
SLING ARM FOAM STRAP LRG (SOFTGOODS) ×2 IMPLANT
SLING ARM IMMOBILIZER LRG (SOFTGOODS) ×2 IMPLANT
SPONGE GAUZE 4X4 12PLY (GAUZE/BANDAGES/DRESSINGS) ×3 IMPLANT
SUT ETHILON 2 0 PS N (SUTURE) ×2 IMPLANT
SYR CONTROL 10ML LL (SYRINGE) ×2 IMPLANT
TOWEL OR 17X26 10 PK STRL BLUE (TOWEL DISPOSABLE) ×6 IMPLANT

## 2013-01-06 NOTE — Progress Notes (Signed)
Patient has not had BM since PTA 5/30, offered fleets enema tonight as requested by patient and as ordered by MD during the previous shift. Patient stated several times throughout the night that she did not want to take the enema tonight. Scheduled Miralax was given as ordered. Will offer again in the AM and continue to monitor.  [name]

## 2013-01-06 NOTE — Transfer of Care (Signed)
Immediate Anesthesia Transfer of Care Note  Patient: [name]  Procedure(s) Performed: Procedure(s) (LRB): IRRIGATION AND DEBRIDEMENT LEFT ELBOW AND LEFT FOREARM  (Left) APPLICATION OF WOUND VAC X 2 (Left)  Patient Location: PACU  Anesthesia Type: General  Level of Consciousness: sedated, patient cooperative and responds to stimulaton  Airway & Oxygen Therapy: Patient Spontanous Breathing and Patient connected to face mask oxgen  Post-op Assessment: Report given to PACU RN and Post -op Vital signs reviewed and stable  Post vital signs: Reviewed and stable  Complications: No apparent anesthesia complications

## 2013-01-06 NOTE — Anesthesia Preprocedure Evaluation (Addendum)
Anesthesia Evaluation  Patient identified by MRN, date of birth, ID band Patient awake    Reviewed: Allergy & Precautions, H&P , NPO status , Patient's Chart, lab work & pertinent test results  Airway Mallampati: III TM Distance: >3 FB Neck ROM: Full    Dental  (+) Poor Dentition, Missing and Caps   Pulmonary former smoker,  breath sounds clear to auscultation  Pulmonary exam normal       Cardiovascular + dysrhythmias Atrial Fibrillation + Valvular Problems/Murmurs MVP Rhythm:Regular Rate:Normal     Neuro/Psych negative neurological ROS  negative psych ROS   GI/Hepatic negative GI ROS, Neg liver ROS,   Endo/Other  diabetes, Type 2, Insulin Dependent  Renal/GU negative Renal ROS  negative genitourinary   Musculoskeletal negative musculoskeletal ROS (+)   Abdominal   Peds  Hematology negative hematology ROS (+)   Anesthesia Other Findings   Reproductive/Obstetrics                          Anesthesia Physical Anesthesia Plan  ASA: III  Anesthesia Plan: General   Post-op Pain Management:    Induction: Intravenous  Airway Management Planned: Oral ETT  Additional Equipment:   Intra-op Plan:   Post-operative Plan: Extubation in OR  Informed Consent: I have reviewed the patients History and Physical, chart, labs and discussed the procedure including the risks, benefits and alternatives for the proposed anesthesia with the patient or authorized representative who has indicated his/her understanding and acceptance.   Dental advisory given  Plan Discussed with: CRNA  Anesthesia Plan Comments: (NPO past 06:30)       Anesthesia Quick Evaluation

## 2013-01-06 NOTE — Progress Notes (Signed)
ANTIBIOTIC CONSULT NOTE - INITIAL  Pharmacy Consult for Vancomycin  Indication: Myofasciitis  No Known Allergies  Patient Measurements: Height: 5\' 4"  (162.6 cm) Weight: 175 lb 7.8 oz (79.6 kg) IBW/kg (Calculated) : 54.7   Vital Signs: Temp: 97.7 F (36.5 C) (06/05 2300) BP: 106/50 mmHg (06/05 2300) Pulse Rate: 80 (06/05 2300) Intake/Output from previous day: 06/04 0701 - 06/05 0700 In: 820 [P.O.:720; IV Piggyback:100] Out: -  Intake/Output from this shift: Total I/O In: 2000 [I.V.:2000] Out: -   Labs:  Recent Labs  01/05/13 0510 01/06/13 0545  WBC  --  26.7*  HGB  --  10.2*  PLT  --  372  CREATININE 0.73 0.63   Estimated Creatinine Clearance: 71.6 ml/min (by C-G formula based on Cr of 0.63). No results found for this basename: VANCOTROUGH, VANCOPEAK, VANCORANDOM, GENTTROUGH, GENTPEAK, GENTRANDOM, TOBRATROUGH, TOBRAPEAK, TOBRARND, AMIKACINPEAK, AMIKACINTROU, AMIKACIN,  in the last 72 hours   Microbiology: Recent Results (from the past 720 hour(s))  CULTURE, BLOOD (ROUTINE X 2)     Status: None   Collection Time    12/31/12  6:45 PM      Result Value Range Status   Specimen Description BLOOD RIGHT ARM   Final   Special Requests BOTTLES DRAWN AEROBIC AND ANAEROBIC   Final   Culture  Setup Time 12/31/2012 23:44   Final   Culture NO GROWTH 5 DAYS   Final   Report Status 01/06/2013 FINAL   Final  CULTURE, BLOOD (ROUTINE X 2)     Status: None   Collection Time    12/31/12  6:51 PM      Result Value Range Status   Specimen Description BLOOD RIGHT HAND   Final   Special Requests BOTTLES DRAWN AEROBIC AND ANAEROBIC   Final   Culture  Setup Time 12/31/2012 23:43   Final   Culture NO GROWTH 5 DAYS   Final   Report Status 01/06/2013 FINAL   Final  MRSA PCR SCREENING     Status: None   Collection Time    01/01/13  7:21 PM      Result Value Range Status   MRSA by PCR NEGATIVE  NEGATIVE Final   Comment:            The GeneXpert MRSA Assay (FDA     approved  for NASAL specimens     only), is one component of a     comprehensive MRSA colonization     surveillance program. It is not     intended to diagnose MRSA     infection nor to guide or     monitor treatment for     MRSA infections.  SURGICAL PCR SCREEN     Status: None   Collection Time    01/06/13  5:56 PM      Result Value Range Status   MRSA, PCR NEGATIVE  NEGATIVE Final   Staphylococcus aureus NEGATIVE  NEGATIVE Final   Comment:            The Xpert SA Assay (FDA     approved for NASAL specimens     in patients over 74 years of age),     is one component of     a comprehensive surveillance     program.  Test performance has     been validated by [employer] for patients greater     than or equal to 59 year old.     It  is not intended     to diagnose infection nor to     guide or monitor treatment.    Medical History: Past Medical History  Diagnosis Date  . Diabetes mellitus without complication     dx on wed  . Mitral valve prolapse   . Atrial flutter     Medications:  Scheduled:  . [facility] HOLD] aspirin EC  81 mg Oral Daily  . [MAR HOLD] clindamycin (CLEOCIN) IV  600 mg Intravenous Q8H  . [MAR HOLD] insulin aspart  0-5 Units Subcutaneous QHS  . [MAR HOLD] insulin aspart  0-9 Units Subcutaneous TID WC  . [facility] HOLD] meloxicam  15 mg Oral Daily  . [facility] HOLD] metoprolol tartrate  25 mg Oral BID  . [MAR HOLD] polyethylene glycol  17 g Oral BID  . [facility] HOLD] sodium chloride  3 mL Intravenous Q12H  . vancomycin  1,000 mg Intravenous Q12H   Infusions:  . lactated ringers 125 mL/hr at 01/06/13 2247   Assessment: 67 yo s/p excisional debridement of left elbow and left forearm.  MD ordering Vancomycin per Rx for myofasciitis.  Goal of Therapy:  Vancomycin trough level 15-20 mcg/ml  Plan:   VAncomycin 1Gm IV q12h.  CrCl~64 (N)  F/U SCr/levels/cultures as needed  [name] 01/06/2013,11:14 PM

## 2013-01-06 NOTE — Progress Notes (Signed)
[facility] PROGRESS NOTE  JENEAN ESCANDON NWG:[id number] DOB: 07-01-1946 DOA: 12/31/2012 PCP: [name], MD  Brief history: 67 y/o CF with history of DM presenting with LUE cellulitis.  Initially hypotensive has improved after broad spectrum antibiotics and IVF's. Developed atrial flutter and cardiology subsequently consulted.  Atrial flutter/fibrillation controlled with B blocker currently. Patient transitioned from zosyn/vanc to bactrim. WBC slightly up this am.  Ultrasound of LUE to assess for abscesses or DVT has been ordered.  Assessment/Plan:  Cellulitis  - Patient initially on Vancomycin and Zosyn, improving, however getting worse after switching to Bactrim (which she was as an outpatient prior to hospitalization). Change to Clindamycin 6/5. Pain improving however more swollen.  - MRI left elbow showed infected olecranon bursitis and suspect myofasciitis.  - ortho consulted, appreciate input. NPO pending surgical evaluation.  - continue clindamycin - worsening leukocytosis  Left arm DVT - started Xarelto 6/4, on hold today due to possible operative intervention. Half life ~5 hours, last dose last night at 5 pm, and only received one dose of 15 mg. I talked to pharmacy and 24 hours is likely enough wait time.  - will need to resume anticoagulation  Atrial flutter - Heart Rate has been well controlled  - started Xarelto 6/22, holding this morning pending ortho evaluation.  - cardiology following, appreciate input.   Hyponatremia  - Fluid restriction. Improving - TSH borderline, normal free T4; cortisol pending.    Constipation - enema today, refused yesterday  Hypokalemia  - Resolved    Hypotension  - Resolved with IVF's and antibiotics   DM II  - At this point diabetic coordinator has been consulted  - continue SSI  - blood sugars in the low 200's  - Patient does not have any hypoglycemic agents listed on home medication list.  - Will have to adjust and  make recommendations regarding patient's home regimen moving forward.   Code Status: Full code  Family Communication: No family at bedside.  Disposition Plan: home when medically stable.   Consultants:  Cardiology Orthopedic surgery  Procedures:  None  Antibiotics:  Vancomycin and Zosyn 5/30 to 6/2 Bactrim 6/02 - 6/4 Clindamycin 6/4 >>   HPI/Subjective: - left arm pain improved. More swollen however.   Objective: Filed Vitals:   01/05/13 1455 01/05/13 2028 01/05/13 2308 01/06/13 0630  BP:  131/64 133/70 131/61  Pulse: 72 76 71 80  Temp: 98.1 F (36.7 C) 98.1 F (36.7 C)  98.7 F (37.1 C)  TempSrc: Oral Oral  Oral  Resp: 20 18  18   Height:      Weight:      SpO2: 93% 97%  95%    Intake/Output Summary (Last 24 hours) at 01/06/13 0928 Last data filed at 01/06/13 0700  Gross per 24 hour  Intake    700 ml  Output      0 ml  Net    700 ml   Filed Weights   12/31/12 1546 12/31/12 2010 01/01/13 1900  Weight: 75.297 kg (166 lb) 76.567 kg (168 lb 12.8 oz) 79.6 kg (175 lb 7.8 oz)    Exam:   General:  Pt in NAD, Alert and awake  Cardiovascular: RRR, no MRG  Respiratory: CTA BL, no wheezes  Abdomen: soft, NT, ND  Musculoskeletal: no cyanosis or clubbing  Skin: There is cellulitis at proximal lateral LUE above elbow. Painful on palpation. No induration. No palpable cords. Margins marked likely in the ED and looks better overall; more swollen today  Data  Reviewed: Basic Metabolic Panel:  Recent Labs Lab 01/01/13 0557 01/01/13 0600 01/02/13 0354 01/03/13 0856 01/05/13 0510 01/06/13 0545  NA 131*  --  129* 127* 123* 125*  K 3.2*  --  3.6 4.0 4.1 4.5  CL 96  --  95* 93* 90* 92*  CO2 24  --  23 24 22 23   GLUCOSE 226*  --  214* 215* 206* 211*  BUN 26*  --  29* 25* 24* 20  CREATININE 0.65  --  0.79 0.88 0.73 0.63  CALCIUM 8.3*  --  8.2* 8.4 8.5 8.4  MG  --  1.9  --   --   --   --   PHOS  --  3.5  --   --   --   --    Liver Function Tests:  Recent  Labs Lab 01/02/13 0354  AST 18  ALT 19  ALKPHOS 230*  BILITOT 0.5  PROT 6.0  ALBUMIN 2.1*   CBC:  Recent Labs Lab 12/31/12 1638 01/01/13 0557 01/03/13 0856 01/06/13 0545  WBC 24.0* 18.5* 20.4* 26.7*  NEUTROABS 21.2*  --   --   --   HGB 12.7 10.6* 11.0* 10.2*  HCT 36.3 32.2* 32.6* 31.3*  MCV 84.4 85.4 86.9 86.0  PLT 475* 360 404* 372   Cardiac Enzymes:  Recent Labs Lab 12/31/12 1638  TROPONINI <0.30   BNP (last 3 results)  Recent Labs  01/02/13 0354  PROBNP 6103.0*   CBG:  Recent Labs Lab 01/05/13 0734 01/05/13 1211 01/05/13 1706 01/05/13 2117 01/06/13 0736  GLUCAP 191* 187* 171* 168* 177*    Recent Results (from the past 240 hour(s))  CULTURE, BLOOD (ROUTINE X 2)     Status: None   Collection Time    12/31/12  6:45 PM      Result Value Range Status   Specimen Description BLOOD RIGHT ARM   Final   Special Requests BOTTLES DRAWN AEROBIC AND ANAEROBIC   Final   Culture  Setup Time 12/31/2012 23:44   Final   Culture NO GROWTH 5 DAYS   Final   Report Status 01/06/2013 FINAL   Final  CULTURE, BLOOD (ROUTINE X 2)     Status: None   Collection Time    12/31/12  6:51 PM      Result Value Range Status   Specimen Description BLOOD RIGHT HAND   Final   Special Requests BOTTLES DRAWN AEROBIC AND ANAEROBIC   Final   Culture  Setup Time 12/31/2012 23:43   Final   Culture NO GROWTH 5 DAYS   Final   Report Status 01/06/2013 FINAL   Final  MRSA PCR SCREENING     Status: None   Collection Time    01/01/13  7:21 PM      Result Value Range Status   MRSA by PCR NEGATIVE  NEGATIVE Final   Comment:            The GeneXpert MRSA Assay (FDA     approved for NASAL specimens     only), is one component of a     comprehensive MRSA colonization     surveillance program. It is not     intended to diagnose MRSA     infection nor to guide or     monitor treatment for     MRSA infections.     Studies: Mr Forearm Left Wo/w Cm  01/06/2013   *RADIOLOGY  REPORT*  Clinical Data: Right arm cellulitis  with erythema and pain.  MRI OF THE LEFT FOREARM WITHOUT AND WITH CONTRAST  Technique:  Multiplanar, multisequence MR imaging was performed both before and after administration of intravenous contrast.  Contrast: 15mL MULTIHANCE GADOBENATE DIMEGLUMINE 529 MG/ML IV SOLN  Comparison: Radiographs 12/31/2012 and ultrasound 01/05/2013.  Findings: There is extensive subcutaneous edema throughout the forearm which extends beyond the field of view into the upper arm and hand.  Posterior to the proximal ulna is a large peripherally enhancing fluid collection within the subcutaneous fat.  This demonstrates heterogeneous T2 signal and measures approximately 7.0 x 4.3 x 1.6 cm.  This likely represents infected olecranon bursitis.  This subcutaneous fluid extends proximally into the distal upper arm, incompletely visualized.  There is also ill-defined peripherally enhancing fluid within and between the proximal to mid forearm flexor musculature. The intermuscular components are ill defined but most apparent on the postcontrast axial images.  These are fairly extensive and extend from the level of the elbow into the mid forearm.  Appearance is highly concerning for myofasciitis. There is mild tenosynovitis of the flexor tendons within the distal forearm.  There is a small elbow joint effusion without suspicious synovial enhancement.  No significant effusion is identified at the wrist. There is no evidence of osteomyelitis within the radius, ulna or distal humerus.  IMPRESSION:  1.  Findings consistent with diffuse forearm cellulitis with a complex subcutaneous fluid collection posterior to the proximal ulna, suspicious for infected olecranon bursitis. 2.  In addition, there is muscular edema with peripherally enhancing intermuscular fluid collections in the proximal forearm suspicious for myofasciitis. 3.  No evidence of intra-articular infection or osteomyelitis.  These results were  called by telephone on 01/06/2013 at 0845 hours to Dr. Elvera Lennox, who verbally acknowledged these results. Orthopedic surgical consultation recommended.   Original Report Authenticated By: Carey Bullocks, M.D.   [place] Extrem Up Left Comp  01/05/2013   *RADIOLOGY REPORT*  Clinical Data: Pain and swelling.  Possible abscess.  ULTRASOUND LEFT UPPER EXTREMITY COMPLETE  Technique:  Ultrasound examination of the left forearm was performed including evaluation of the muscles, tendons, joint, and adjacent soft tissues.  Comparison:  None.  Findings: Diffuse edematous soft tissues throughout the left forearm with probable fascial fluid but no discrete abscess.  IMPRESSION: Diffuse edema and probable fascial fluid but no discrete abscess. MR would be helpful for further evaluation.   Original Report Authenticated By: Rudie Meyer, M.D.    Scheduled Meds: . aspirin EC  81 mg Oral Daily  . clindamycin (CLEOCIN) IV  600 mg Intravenous Q8H  . insulin aspart  0-5 Units Subcutaneous QHS  . insulin aspart  0-9 Units Subcutaneous TID WC  . meloxicam  15 mg Oral Daily  . metoprolol tartrate  25 mg Oral BID  . polyethylene glycol  17 g Oral BID  . sodium chloride  3 mL Intravenous Q12H  . verapamil  80 mg Oral Q8H   Continuous Infusions:   Principal Problem:   Cellulitis Active Problems:   Hyponatremia   Hypokalemia   Fasting hyperglycemia   Leukocytosis   Hypotension   Diabetes mellitus type 2, noninsulin dependent   Hypertrophic cardiomyopathy   Atrial fibrillation  Time spent: > 35 minutes  Pamella Pert  [facility] Pager [phone] If 7PM-7AM, please contact night-coverage at [email or web address], password [facility] 01/06/2013, 9:28 AM  LOS: 6 days

## 2013-01-06 NOTE — Progress Notes (Signed)
Subjective:  Still with swelling, pain and leukocytosis.  MRI shows a probable infected bursa and ortho is to see. Still with constipation.  Not SOB, no chest pain.  Objective:  Vital Signs in the last 24 hours: BP 131/61  Pulse 80  Temp(Src) 98.7 F (37.1 C) (Oral)  Resp 18  Ht 5\' 4"  (1.626 m)  Wt 79.6 kg (175 lb 7.8 oz)  BMI 30.11 kg/m2  SpO2 95%  Physical Exam: Obese white female in no acute distress Lungs:  Reduced breath sounds Cardiac:  Regular rhythm, normal S1 and S2, no S3 Extremities:  Left arm still swollen with some erythema noted.  Intake/Output from previous day: 06/04 0701 - 06/05 0700 In: 820 [P.O.:720; IV Piggyback:100] Out: -   Weight Filed Weights   12/31/12 1546 12/31/12 2010 01/01/13 1900  Weight: 75.297 kg (166 lb) 76.567 kg (168 lb 12.8 oz) 79.6 kg (175 lb 7.8 oz)    Lab Results: Basic Metabolic Panel:  Recent Labs  40/98/11 0510 01/06/13 0545  NA 123* 125*  K 4.1 4.5  CL 90* 92*  CO2 22 23  GLUCOSE 206* 211*  BUN 24* 20  CREATININE 0.73 0.63    CBC:  Recent Labs  01/06/13 0545  WBC 26.7*  HGB 10.2*  HCT 31.3*  MCV 86.0  PLT 372   BNP    Component Value Date/Time   PROBNP 6103.0* 01/02/2013 0354    Telemetry: Sinus rhythm  ECHO Hypertrophic cardiomyopathy with mild SAM of anterior leaflet of MV, Moderate to severe LA enlargement  Assessment/Plan:  1. Atrial fibrillation resolved 2. 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 3. Cellulitis under treatment white count still up with infected bursa 4. Hyponatremia that is significant at this time. 5. Constipation 6. DVT of left arm  Recommendations:  To have ortho consult.  I would change to Eliquus once we know that no further surgery will be necessary because of potential interaction with Xarelto. Hold Xarelto until after surgery.   Darden Palmer  MD [facility] Cardiology  01/06/2013, 9:39 AM

## 2013-01-06 NOTE — Anesthesia Postprocedure Evaluation (Signed)
Anesthesia Post Note  Patient: [name]  Procedure(s) Performed: Procedure(s) (LRB): IRRIGATION AND DEBRIDEMENT LEFT ELBOW AND LEFT FOREARM  (Left) APPLICATION OF WOUND VAC X 2 (Left)  Anesthesia type: General  Patient location: PACU  Post pain: Pain level controlled  Post assessment: Post-op Vital signs reviewed  Last Vitals:  Filed Vitals:   01/06/13 2200  BP:   Pulse:   Temp: 36.5 C  Resp:     Post vital signs: Reviewed  Level of consciousness: sedated  Complications: No apparent anesthesia complications

## 2013-01-06 NOTE — Consult Note (Signed)
Reason for Consult:left arm pain Referring Physician: Dr Lenny Pastel [name] is an 67 y.o. female.  HPI: [name] is a 67 year old patient with left elbow pain one week's duration. She describes relatively acute onset of left elbow pain. She is currently admitted to the hospital as had extensive workup. She's currently on clindamycin. She did have a DVT in the left upper extremity. She also on MRI scan as infected olecranon bursitis along with my fasciitis in the lower proximal anterior muscle compartments. She has multiple cardiac issues his been addressed by the cardiology service and Dr. Donnie Aho. She was placed on the raw toe at 5 PM last night. She reports significant or pain along with the difficulty using the hand and wrist. She denies any shoulder symptoms.  Past Medical History  Diagnosis Date  . Diabetes mellitus without complication     dx on wed  . Mitral valve prolapse   . Atrial flutter     Past Surgical History  Procedure Laterality Date  . Dental surgery  Oct. 2011    several extractions, bone graft    Family History  Problem Relation Age of Onset  . Cancer Mother   . Diabetes Father     Social History:  reports that she quit smoking about 32 years ago. She has never used smokeless tobacco. She reports that she does not drink alcohol or use illicit drugs.  Allergies: No Known Allergies  Medications: I have reviewed the patient's current medications.  Results for orders placed during the hospital encounter of 12/31/12 (from the past 48 hour(s))  GLUCOSE, CAPILLARY     Status: Abnormal   Collection Time    01/04/13  4:36 PM      Result Value Range   Glucose-Capillary 137 (*) 70 - 99 mg/dL  GLUCOSE, CAPILLARY     Status: Abnormal   Collection Time    01/04/13  9:43 PM      Result Value Range   Glucose-Capillary 163 (*) 70 - 99 mg/dL   Comment 1 Procedure Error    BASIC METABOLIC PANEL     Status: Abnormal   Collection Time    01/05/13  5:10 AM       Result Value Range   Sodium 123 (*) 135 - 145 mEq/L   Potassium 4.1  3.5 - 5.1 mEq/L   Chloride 90 (*) 96 - 112 mEq/L   CO2 22  19 - 32 mEq/L   Glucose, Bld 206 (*) 70 - 99 mg/dL   BUN 24 (*) 6 - 23 mg/dL   Creatinine, Ser 8.11  0.50 - 1.10 mg/dL   Calcium 8.5  8.4 - 91.4 mg/dL   GFR calc non Af Amer 88 (*) >90 mL/min   GFR calc Af Amer >90  >90 mL/min   Comment:            The eGFR has been calculated     using the CKD EPI equation.     This calculation has not been     validated in all clinical     situations.     eGFR's persistently     <90 mL/min signify     possible Chronic Kidney Disease.  GLUCOSE, CAPILLARY     Status: Abnormal   Collection Time    01/05/13  7:34 AM      Result Value Range   Glucose-Capillary 191 (*) 70 - 99 mg/dL  GLUCOSE, CAPILLARY     Status: Abnormal   Collection Time  01/05/13 12:11 PM      Result Value Range   Glucose-Capillary 187 (*) 70 - 99 mg/dL  GLUCOSE, CAPILLARY     Status: Abnormal   Collection Time    01/05/13  5:06 PM      Result Value Range   Glucose-Capillary 171 (*) 70 - 99 mg/dL  GLUCOSE, CAPILLARY     Status: Abnormal   Collection Time    01/05/13  9:17 PM      Result Value Range   Glucose-Capillary 168 (*) 70 - 99 mg/dL  CBC     Status: Abnormal   Collection Time    01/06/13  5:45 AM      Result Value Range   WBC 26.7 (*) 4.0 - 10.5 K/uL   RBC 3.64 (*) 3.87 - 5.11 MIL/uL   Hemoglobin 10.2 (*) 12.0 - 15.0 g/dL   HCT 81.1 (*) 91.4 - 78.2 %   MCV 86.0  78.0 - 100.0 fL   MCH 28.0  26.0 - 34.0 pg   MCHC 32.6  30.0 - 36.0 g/dL   RDW 95.6  21.3 - 08.6 %   Platelets 372  150 - 400 K/uL  BASIC METABOLIC PANEL     Status: Abnormal   Collection Time    01/06/13  5:45 AM      Result Value Range   Sodium 125 (*) 135 - 145 mEq/L   Potassium 4.5  3.5 - 5.1 mEq/L   Chloride 92 (*) 96 - 112 mEq/L   CO2 23  19 - 32 mEq/L   Glucose, Bld 211 (*) 70 - 99 mg/dL   BUN 20  6 - 23 mg/dL   Creatinine, Ser 5.78  0.50 - 1.10 mg/dL    Calcium 8.4  8.4 - 46.9 mg/dL   GFR calc non Af Amer >90  >90 mL/min   GFR calc Af Amer >90  >90 mL/min   Comment:            The eGFR has been calculated     using the CKD EPI equation.     This calculation has not been     validated in all clinical     situations.     eGFR's persistently     <90 mL/min signify     possible Chronic Kidney Disease.  CORTISOL     Status: None   Collection Time    01/06/13  5:45 AM      Result Value Range   Cortisol, Plasma 24.1     Comment: (NOTE)     AM:  4.3 - 22.4 ug/dL     PM:  3.1 - 62.9 ug/dL  GLUCOSE, CAPILLARY     Status: Abnormal   Collection Time    01/06/13  7:36 AM      Result Value Range   Glucose-Capillary 177 (*) 70 - 99 mg/dL  GLUCOSE, CAPILLARY     Status: Abnormal   Collection Time    01/06/13 11:40 AM      Result Value Range   Glucose-Capillary 214 (*) 70 - 99 mg/dL    Mr Forearm Left Wo/w Cm  01/06/2013   *RADIOLOGY REPORT*  Clinical Data: Right arm cellulitis with erythema and pain.  MRI OF THE LEFT FOREARM WITHOUT AND WITH CONTRAST  Technique:  Multiplanar, multisequence MR imaging was performed both before and after administration of intravenous contrast.  Contrast: 15mL MULTIHANCE GADOBENATE DIMEGLUMINE 529 MG/ML IV SOLN  Comparison: Radiographs 12/31/2012 and ultrasound 01/05/2013.  Findings: There  is extensive subcutaneous edema throughout the forearm which extends beyond the field of view into the upper arm and hand.  Posterior to the proximal ulna is a large peripherally enhancing fluid collection within the subcutaneous fat.  This demonstrates heterogeneous T2 signal and measures approximately 7.0 x 4.3 x 1.6 cm.  This likely represents infected olecranon bursitis.  This subcutaneous fluid extends proximally into the distal upper arm, incompletely visualized.  There is also ill-defined peripherally enhancing fluid within and between the proximal to mid forearm flexor musculature. The intermuscular components are ill defined  but most apparent on the postcontrast axial images.  These are fairly extensive and extend from the level of the elbow into the mid forearm.  Appearance is highly concerning for myofasciitis. There is mild tenosynovitis of the flexor tendons within the distal forearm.  There is a small elbow joint effusion without suspicious synovial enhancement.  No significant effusion is identified at the wrist. There is no evidence of osteomyelitis within the radius, ulna or distal humerus.  IMPRESSION:  1.  Findings consistent with diffuse forearm cellulitis with a complex subcutaneous fluid collection posterior to the proximal ulna, suspicious for infected olecranon bursitis. 2.  In addition, there is muscular edema with peripherally enhancing intermuscular fluid collections in the proximal forearm suspicious for myofasciitis. 3.  No evidence of intra-articular infection or osteomyelitis.  These results were called by telephone on 01/06/2013 at 0845 hours to Dr. Elvera Lennox, who verbally acknowledged these results. Orthopedic surgical consultation recommended.   Original Report Authenticated By: Carey Bullocks, M.D.   [place] Extrem Up Left Comp  01/05/2013   *RADIOLOGY REPORT*  Clinical Data: Pain and swelling.  Possible abscess.  ULTRASOUND LEFT UPPER EXTREMITY COMPLETE  Technique:  Ultrasound examination of the left forearm was performed including evaluation of the muscles, tendons, joint, and adjacent soft tissues.  Comparison:  None.  Findings: Diffuse edematous soft tissues throughout the left forearm with probable fascial fluid but no discrete abscess.  IMPRESSION: Diffuse edema and probable fascial fluid but no discrete abscess. MR would be helpful for further evaluation.   Original Report Authenticated By: Rudie Meyer, M.D.    Review of Systems  Constitutional: Positive for fever and chills.  HENT: Negative.   Eyes: Negative.   Respiratory: Negative.   Cardiovascular: Negative.   Gastrointestinal: Negative.    Genitourinary: Negative.   Musculoskeletal: Positive for joint pain.  Skin: Negative.   Neurological: Negative.   Endo/Heme/Allergies: Negative.   Psychiatric/Behavioral: Negative.    Blood pressure 131/61, pulse 80, temperature 98.7 F (37.1 C), temperature source Oral, resp. rate 18, height 5\' 4"  (1.626 m), weight 79.6 kg (175 lb 7.8 oz), SpO2 95.00%. Physical Exam  Constitutional: She appears well-developed.  HENT:  Head: Normocephalic.  Eyes: Pupils are equal, round, and reactive to light.  Neck: Normal range of motion.  Cardiovascular: Normal rate.   Respiratory: Effort normal.  Neurological: She is alert.  Skin: Skin is warm.  Psychiatric: She has a normal mood and affect.   damage of the left elbow demonstrates fluctuant fluid collection in the olecranon fossa just has some cellulitis extending proximally along the lateral aspect of the distal humerus she also has tenderness and swelling on the volar aspect of the forearm. Compartments are soft she does have intact EPL FPL interosseous function however wrist motion is painful. No tissue crepitus to direct palpation. Elbow range of motion is present but painful. The proximal lymphadenopathy is present.  Assessment/Plan: Impression is infected olecranon bursitis} infection the left  elbow. Patient also has a component of my fasciitis in the volar forearm proximally. Both of these may be surgically explored and year he gated. Cultures will be obtained. Risk and benefits of procedure including but limited to infection persistent along with nerve vessel damage are discussed. All questions answered. As a significant infection. We'll need to delay until sometime after 5:00 today in order to allow the anticoagulation effect is Rolfes 02 being minimize. She'll need to resume that in the near future after surgery.  DEAN,GREGORY SCOTT 01/06/2013, 12:11 PM

## 2013-01-06 NOTE — Progress Notes (Addendum)
I was called by orthopedics, Dr. August Saucer to update me on the patient's clinical condition. He was concerned because the patient was hypotensive during the surgery with a systolic blood pressure in the 70s and 80s. Estimated blood loss during surgery 100 cc. Currently, Dr. August Saucer stated that the patient's vital signs were stable with blood pressure 106/60 and heart rate in the 80s. The patient is currently in PACU. Apparently, there was concern of the patient having propagation of her left upper extremity clot. The patient was on rivaroxaban with her last dose on 01/05/2013 at 1730 hours.  The patient's left upper extremity ultrasound revealed a subacute/age indeterminate left brachial vein thrombus. Other possibility exists for propagation, it  is quite low. The patient was previously on vancomycin and Zosyn for 12/31/2012-01/03/2013 after which the patient was started on Bactrim DS. Because there was concern for clinical worsening, Bactrim DS was discontinued and clindamycin was started on 01/05/2013. MRI revealed olecranon bursitis and mild fasciitis.  Discontinue verapamil and the face of soft blood pressure, continue metoprolol. Restart vancomycin IV pending culture data. Continue clindamycin for its antitoxin effect. Followup surgical culture data and adjust antibiotics accordingly.  The patient is already on lactated ringers at 125cc/hr  DTat

## 2013-01-06 NOTE — Brief Op Note (Signed)
12/31/2012 - 01/06/2013  9:24 PM  PATIENT:  [name]  67 y.o. female  PRE-OPERATIVE DIAGNOSIS:  infected left elbow and forearm  POST-OPERATIVE DIAGNOSIS:  INFECTED LEFT ELBOW AND FOREARM  PROCEDURE:  Procedure(s): IRRIGATION AND  Excisional DEBRIDEMENT LEFT ELBOW AND LEFT FOREARM  APPLICATION OF WOUND VAC X 2  SURGEON:  Surgeon(s): Cammy Copa, MD  ASSISTANT:   ANESTHESIA:   general  EBL: 150 ml    Total I/O In: 1000 [I.V.:1000] Out: -   BLOOD ADMINISTERED: none  DRAINS: wound vac   LOCAL MEDICATIONS USED:  none  SPECIMEN:  cxs x 2  COUNTS:  YES  TOURNIQUET:  * No tourniquets in log *  DICTATION: .Other Dictation: Dictation Number [id number]  PLAN OF CARE: Admit to inpatient   PATIENT DISPOSITION:  PACU - hemodynamically stable

## 2013-01-06 NOTE — Progress Notes (Signed)
ANTICOAGULATION CONSULT NOTE - Follow-up Consult  Pharmacy Consult for Xarelto Indication: atrial fibrillation, DVT  No Known Allergies  Patient Measurements: Height: 5\' 4"  (162.6 cm) Weight: 175 lb 7.8 oz (79.6 kg) IBW/kg (Calculated) : 54.7  Vital Signs: Temp: 98.7 F (37.1 C) (06/05 0630) Temp src: Oral (06/05 0630) BP: 131/61 mmHg (06/05 0630) Pulse Rate: 80 (06/05 0630)  Labs:  Recent Labs  01/03/13 0856 01/05/13 0510 01/06/13 0545  HGB 11.0*  --  10.2*  HCT 32.6*  --  31.3*  PLT 404*  --  372  CREATININE 0.88 0.73 0.63    Estimated Creatinine Clearance: 71.6 ml/min (by C-G formula based on Cr of 0.63).   Medical History: Past Medical History  Diagnosis Date  . Diabetes mellitus without complication     dx on wed  . Mitral valve prolapse   . Atrial flutter      Assessment: 89 yoF with new onset atrial fibrillation and LUE DVT noted on venous Doppler.  Patient was started on Lovenox for afib on 6/2, now cardiology c/s recommending switch to Xarelto due to DVT.    Renal function stable, CrCl~71 ml/min.    CBC: Hgb=10.2 (dec from previous), plts WNL  Note: patient is on verapamil - due to P-glycoprotein and CYP-450 inhibition - expect increase effects of Xarelto, Mfg recommends avoiding when CrCl <44ml/min  CrCl calculated as 43ml/min with current SCr using IBW  CrCl calculated as 195ml/min with current SCr using actual BW  Patient also on meloxicam which can increase risk of bleeding  Goal of Therapy:  Treatment of DVT Monitor platelets by anticoagulation protocol: Yes   Plan:   Xarelto 15 mg PO twice daily with meals x 21 days (6/4-6/25) then adjust dosing to 20 mg once daily with evening meal starting on 6/26.   Holding anticoagulation for orthopedic surgery for likely elbow bursitis  Discuss with [facility] and cardiology that Xarelto and verapamil not best combination to use - not contraindicated but to avoid if possible with CrCl <79ml/min  (patient's CrCl is little below this)  Follow-up anticoagulation plans post-op  Juliette Alcide, PharmD, BCPS.   Pager: [phone]  01/06/2013,8:46 AM

## 2013-01-06 NOTE — Progress Notes (Signed)
Inpatient Diabetes Program Recommendations  AACE/ADA: New Consensus Statement on Inpatient Glycemic Control (2013)  Target Ranges:  Prepandial:   less than 140 mg/dL      Peak postprandial:   less than 180 mg/dL (1-2 hours)      Critically ill patients:  140 - 180 mg/dL   Reason for Visit: Newly-diagnosed DM  Pt for surgery late this afternoon.  "Do we have to talk about diabetes? 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  States she knows all about diabetes and how to give insulin shots.   Results for LAQUESHA, HOLCOMB (MRN [id number]) as of 01/06/2013 15:27  Ref. Range 01/06/2013 05:45  Sodium Latest Range: 135-145 mEq/L 125 (L)  Potassium Latest Range: 3.5-5.1 mEq/L 4.5  Chloride Latest Range: 96-112 mEq/L 92 (L)  CO2 Latest Range: 19-32 mEq/L 23  BUN Latest Range: 6-23 mg/dL 20  Creatinine Latest Range: 0.50-1.10 mg/dL 4.09  Calcium Latest Range: 8.4-10.5 mg/dL 8.4  GFR calc non Af Amer Latest Range: >90 mL/min >90  GFR calc Af Amer Latest Range: >90 mL/min >90  Glucose Latest Range: 70-99 mg/dL 811 (H)  Results for SHOSHANNA, MCQUITTY (MRN [id number]) as of 01/06/2013 15:27  Ref. Range 01/05/2013 12:11 01/05/2013 17:06 01/05/2013 21:17 01/06/2013 07:36 01/06/2013 11:40  Glucose-Capillary Latest Range: 70-99 mg/dL 213 (H) 086 (H) 578 (H) 177 (H) 214 (H)     Inpatient Diabetes Program Recommendations Insulin - Basal: If blood glucose continues to be greater than 180 mg/dl, may want to start low dose basal insulin (Levemir 5 units daily). Correction (SSI): Please consider increasing Novolog correction to moderate scale.  Note: Will continue to follow for needs.  Thank you. Ailene Ards, RD, LDN, CDE Inpatient Diabetes Coordinator [phone]

## 2013-01-07 ENCOUNTER — Encounter (HOSPITAL_COMMUNITY): Payer: Self-pay | Admitting: Orthopedic Surgery

## 2013-01-07 ENCOUNTER — Other Ambulatory Visit: Payer: Self-pay

## 2013-01-07 LAB — BASIC METABOLIC PANEL
Calcium: 8 mg/dL — ABNORMAL LOW (ref 8.4–10.5)
GFR calc Af Amer: >90 mL/min (ref >90)
GFR calc non Af Amer: >90 mL/min (ref >90)
Potassium: 4.2 mEq/L (ref 3.5–5.1)
Sodium: 122 mEq/L — ABNORMAL LOW (ref 135–145)

## 2013-01-07 LAB — CBC
MCHC: 33.3 g/dL (ref 30.0–36.0)
RDW: 13.8 % (ref 11.5–15.5)

## 2013-01-07 LAB — PREPARE RBC (CROSSMATCH)

## 2013-01-07 LAB — HEMOGLOBIN AND HEMATOCRIT, BLOOD
HCT: 21.5 % — ABNORMAL LOW (ref 36.0–46.0)
Hemoglobin: 7.3 g/dL — ABNORMAL LOW (ref 12.0–15.0)

## 2013-01-07 LAB — GLUCOSE, CAPILLARY
Glucose-Capillary: 151 mg/dL — ABNORMAL HIGH (ref 70–99)
Glucose-Capillary: 129 mg/dL — ABNORMAL HIGH (ref 70–99)

## 2013-01-07 MED ORDER — PIPERACILLIN-TAZOBACTAM 3.375 G IVPB
3.3750 g | Freq: Three times a day (TID) | INTRAVENOUS | Status: DC
  Administered 2013-01-07 – 2013-01-09 (×6): 3.375 g via INTRAVENOUS
  Filled 2013-01-07 (×8): qty 50

## 2013-01-07 MED ORDER — MORPHINE SULFATE 2 MG/ML IJ SOLN
2.0000 mg | Freq: Once | INTRAMUSCULAR | Status: AC | PRN
  Administered 2013-01-07: 2 mg via INTRAVENOUS
  Filled 2013-01-07: qty 1

## 2013-01-07 MED ORDER — RIVAROXABAN 15 MG PO TABS
15.0000 mg | ORAL_TABLET | Freq: Two times a day (BID) | ORAL | Status: DC
  Administered 2013-01-07: 15 mg via ORAL
  Filled 2013-01-07 (×6): qty 1

## 2013-01-07 MED ORDER — MORPHINE SULFATE 4 MG/ML IJ SOLN
INTRAMUSCULAR | Status: AC
  Filled 2013-01-07: qty 1

## 2013-01-07 MED ORDER — SODIUM CHLORIDE 0.9 % IV BOLUS (SEPSIS)
500.0000 mL | Freq: Once | INTRAVENOUS | Status: AC
  Administered 2013-01-07: 500 mL via INTRAVENOUS

## 2013-01-07 MED ORDER — RIVAROXABAN 15 MG PO TABS
15.0000 mg | ORAL_TABLET | Freq: Two times a day (BID) | ORAL | Status: DC
  Filled 2013-01-07 (×2): qty 1

## 2013-01-07 MED ORDER — RIVAROXABAN 20 MG PO TABS: 20.0000 mg | ORAL_TABLET | Freq: Every day | ORAL | Status: DC

## 2013-01-07 MED ORDER — POTASSIUM CHLORIDE IN NACL 20-0.9 MEQ/L-% IV SOLN
INTRAVENOUS | Status: DC
  Administered 2013-01-07 – 2013-01-11 (×7): via INTRAVENOUS
  Administered 2013-01-12: 20 mL via INTRAVENOUS
  Administered 2013-01-14: 04:00:00 via INTRAVENOUS
  Filled 2013-01-07 (×13): qty 1000

## 2013-01-07 MED ORDER — LACTATED RINGERS IV SOLN: INTRAVENOUS | Status: DC

## 2013-01-07 MED ORDER — SODIUM CHLORIDE 0.9 % IV BOLUS (SEPSIS)
1000.0000 mL | Freq: Once | INTRAVENOUS | Status: AC
  Administered 2013-01-07: 1000 mL via INTRAVENOUS

## 2013-01-07 MED ORDER — MORPHINE SULFATE 4 MG/ML IJ SOLN
4.0000 mg | Freq: Once | INTRAMUSCULAR | Status: AC
  Administered 2013-01-07: 4 mg via INTRAVENOUS

## 2013-01-07 NOTE — Progress Notes (Addendum)
ANTICOAGULATION CONSULT NOTE - Follow-up Consult  Pharmacy Consult for Xarelto Indication: atrial fibrillation, DVT  No Known Allergies  Patient Measurements: Height: 5\' 4"  (162.6 cm) Weight: 179 lb 3.7 oz (81.3 kg) IBW/kg (Calculated) : 54.7  Vital Signs: Temp: 97 F (36.1 C) (06/06 0400) Temp src: Oral (06/06 0400) BP: 114/56 mmHg (06/06 0600) Pulse Rate: 85 (06/06 0600)  Labs:  Recent Labs  01/05/13 0510 01/06/13 0545 01/07/13 0339  HGB  --  10.2* 8.5*  HCT  --  31.3* 25.5*  PLT  --  372 314  CREATININE 0.73 0.63 0.67    Estimated Creatinine Clearance: 72.3 ml/min (by C-G formula based on Cr of 0.67).   Medical History: Past Medical History  Diagnosis Date  . Diabetes mellitus without complication     dx on wed  . Mitral valve prolapse   . Atrial flutter      Assessment: 84 yoF with new onset atrial fibrillation and LUE DVT noted on venous Doppler.  Patient was started on Lovenox for afib on 6/2, now cardiology c/s recommending switch to Xarelto due to DVT.    Renal function stable, CrCl~72 ml/min.    CBC: Hgb=8.5 (trending down, lost only blood in OR 6/5)), plts WNL  Note: patient was on verapamil (currently on hold vs d/c'd) - due to P-glycoprotein and CYP-450 inhibition - expect increase effects of Xarelto, but OK to use with CrCl > 41ml/min   Patient's CrCl (using actual BW)~ 119ml.min  Patient also on meloxicam which can increase risk of bleeding  Goal of Therapy:  Treatment of DVT Monitor platelets by anticoagulation protocol: Yes   Plan:   Xarelto 15 mg PO twice daily with meals x 21 days (6/6-6/26) to start today at 15:00 per MD following I&D of L elbow/forearm, Xarelto 20 mg once daily with evening meal to start on 6/27.   Verapamil currently discontinued so no issues with drug interactions currently, found information that Xarelto was studied using Actual BW to calculate CrCl, thus her CrCl ~123ml/min. So, per mfg, Ok to use Xarelto  even if verapamil resumed (although would still expect higher drug levels than not on Xarelto)  Continue to watch CBC for Hgb and platelet trends  Juliette Alcide, PharmD, BCPS.   Pager: [phone]  01/07/2013,7:12 AM

## 2013-01-07 NOTE — Progress Notes (Signed)
OT Cancellation Note  Patient Details Name: [name] MRN: [id number] DOB: 03/18/1946   Cancelled Treatment:    Reason Eval/Treat Not Completed: Medical issues which prohibited therapy.  Pt not ready to get up:  Was bleeding.  Will likely check back Monday.  [name] 01/07/2013, 1:41 PM Marica Otter, OTR/L [phone] 01/07/2013

## 2013-01-07 NOTE — Consult Note (Signed)
WOC consult Note Reason for Consult: evaluation of malfunctioning VAC dressings x 2.  Pt to OR yesterday for debridement of the left elbow.  She has medial and lateral VAC dressings in place that are not functional.  The lateral wound has two large collections of blood under the drape, one appears to be fluid the other clotted.  The medial wound has some clotting. Verified with Dr. August Saucer what was placed in the wound in the OR yesterday (only black foam).  Attempted to begin dressing change around 930-10am after patient received PO pain meds, however she got very nauseated and reported pain at 9 with my manipulation of the dressing.  Substantial oozing noted and large clot at the proximal end of the wound.  Notified Dr. August Saucer a second time and requested IV pain meds and reported the amount of bleeding noted from the lateral wound. Canister with 300 cc in it from the OR.  TO received for morphine.  Bedside nurse gave first dose and I was able to remove the dressing and the clot from the wound.   Wound type: surgical x 3 Pressure Ulcer POA: No Measurement: Left lateral: 19cm x 3.5cm x 1.0cm Left medial distal 5cm x 2.0cm x 0.5cm; proximal: 5cm x 2.5cm x 0.5cm  Wound RUE:AVWU lateral upper arm: multiple areas of oozing throughout the wound bed with subcutaneous tissue  Left medial arm: subcutaneous tissue with more oozing of the proximal wound Drainage (amount, consistency, odor) left lateral with 300 cc sanguinous drainage in the canister and immediately started to drain into canister once seal obtained.  Left medial: minimal drainage Periwound:intact Dressing procedure/placement/frequency: 1pc ov black granufoam placed to the lateral wound, seal obtained.  2pc used to fill both wounds with bridge in between the two, protected intact skin where suture is in the center of the left medial wound with drape prior to dressing application, seal obtained.    WOC team will follow along as needed for assistance with  NPWT dressing, per Dr. August Saucer back to OR tom for another wash out and possible closure.  Liberato Stansbery Eliberto Ivory RN,CWOCN [phone]

## 2013-01-07 NOTE — Progress Notes (Addendum)
[facility] PROGRESS NOTE  LONITA DEBES VHQ:[id number] DOB: [birth year] DOA: 12/31/2012 PCP: [name], MD  Brief history: 67 y/o CF with history of DM presenting with LUE cellulitis.  Initially hypotensive has improved after broad spectrum antibiotics and IVF's. Developed atrial flutter and cardiology subsequently consulted.  Atrial flutter/fibrillation controlled with B blocker currently. Patient transitioned from zosyn/vanc to bactrim. WBC slightly up this am.  Ultrasound of LUE to assess for abscesses or DVT has been ordered.  Assessment/Plan:  Cellulitis Patient initially on Vancomycin and Zosyn, improving, however getting worse after switching to Bactrim (which she was as an outpatient prior to hospitalization). MRI left elbow showed infected olecranon bursitis and suspect myofasciitis; ortho consulted, s/p excisional debridement left elbow and forearm. Hypotensive after surgery thus was transferred in SDU and Vancomycin/Zosyn added to her regimen. Left arm DVT- started Xarelto 6/4, on hold in the day of surgery, restarted today.  Atrial flutter- Heart Rate has been well controlled. Given hypotension, will use Metop. On Xarelto. Hyponatremia - TSH borderline, normal free T4; cortisol appropriately elevated. Monitor. Constipation- enema today, refused again yesterday Hypokalemia - Resolved  Hypotension - in SDU, continue IVF DM II - continue SSI   Code Status: Full code  Family Communication: No family at bedside.  Disposition Plan: home when medically stable.   Consultants:  Cardiology Orthopedic surgery  Procedures:  None  Antibiotics:  Vancomycin and Zosyn 5/30 to 6/2 Bactrim 6/02 - 6/4 Clindamycin 6/4 >> 6/6 Vancomycin 6/6 >>  Zosyn 6/6 >>  HPI/Subjective: - complains of left arm pain  Objective: Filed Vitals:   01/07/13 0300 01/07/13 0400 01/07/13 0500 01/07/13 0600  BP:  122/57 112/58 114/56  Pulse: 82 82 84 85  Temp:  97 F (36.1 C)     TempSrc:  Oral    Resp: 16 15 15 16   Height:      Weight:      SpO2: 99% 97% 97% 98%    Intake/Output Summary (Last 24 hours) at 01/07/13 0737 Last data filed at 01/07/13 0600  Gross per 24 hour  Intake   3525 ml  Output    750 ml  Net   2775 ml   Filed Weights   12/31/12 2010 01/01/13 1900 01/07/13 0015  Weight: 76.567 kg (168 lb 12.8 oz) 79.6 kg (175 lb 7.8 oz) 81.3 kg (179 lb 3.7 oz)   Exam:  General:  Pt in NAD, Alert and awake  Cardiovascular: RRR, no MRG  Respiratory: CTA BL, no wheezes  Abdomen: soft, NT, ND  Musculoskeletal: no cyanosis or clubbing; left elbow dressing in place  Data Reviewed: Basic Metabolic Panel:  Recent Labs Lab 01/01/13 0557 01/01/13 0600 01/02/13 0354 01/03/13 0856 01/05/13 0510 01/06/13 0545 01/07/13 0339  NA 131*  --  129* 127* 123* 125* 122*  K 3.2*  --  3.6 4.0 4.1 4.5 4.2  CL 96  --  95* 93* 90* 92* 91*  CO2 24  --  23 24 22 23 23   GLUCOSE 226*  --  214* 215* 206* 211* 172*  BUN 26*  --  29* 25* 24* 20 17  CREATININE 0.65  --  0.79 0.88 0.73 0.63 0.67  CALCIUM 8.3*  --  8.2* 8.4 8.5 8.4 8.0*  MG  --  1.9  --   --   --   --   --   PHOS  --  3.5  --   --   --   --   --  Liver Function Tests:  Recent Labs Lab 01/02/13 0354  AST 18  ALT 19  ALKPHOS 230*  BILITOT 0.5  PROT 6.0  ALBUMIN 2.1*   CBC:  Recent Labs Lab 12/31/12 1638 01/01/13 0557 01/03/13 0856 01/06/13 0545 01/07/13 0339  WBC 24.0* 18.5* 20.4* 26.7* 29.1*  NEUTROABS 21.2*  --   --   --   --   HGB 12.7 10.6* 11.0* 10.2* 8.5*  HCT 36.3 32.2* 32.6* 31.3* 25.5*  MCV 84.4 85.4 86.9 86.0 85.9  PLT 475* 360 404* 372 314   Cardiac Enzymes:  Recent Labs Lab 12/31/12 1638  TROPONINI <0.30   BNP (last 3 results)  Recent Labs  01/02/13 0354  PROBNP 6103.0*   CBG:  Recent Labs Lab 01/05/13 2117 01/06/13 0736 01/06/13 1140 01/06/13 1637 01/06/13 2204  GLUCAP 168* 177* 214* 137* 136*    Recent Results (from the past 240 hour(s))   CULTURE, BLOOD (ROUTINE X 2)     Status: None   Collection Time    12/31/12  6:45 PM      Result Value Range Status   Specimen Description BLOOD RIGHT ARM   Final   Special Requests BOTTLES DRAWN AEROBIC AND ANAEROBIC   Final   Culture  Setup Time 12/31/2012 23:44   Final   Culture NO GROWTH 5 DAYS   Final   Report Status 01/06/2013 FINAL   Final  CULTURE, BLOOD (ROUTINE X 2)     Status: None   Collection Time    12/31/12  6:51 PM      Result Value Range Status   Specimen Description BLOOD RIGHT HAND   Final   Special Requests BOTTLES DRAWN AEROBIC AND ANAEROBIC   Final   Culture  Setup Time 12/31/2012 23:43   Final   Culture NO GROWTH 5 DAYS   Final   Report Status 01/06/2013 FINAL   Final  MRSA PCR SCREENING     Status: None   Collection Time    01/01/13  7:21 PM      Result Value Range Status   MRSA by PCR NEGATIVE  NEGATIVE Final   Comment:            The GeneXpert MRSA Assay (FDA     approved for NASAL specimens     only), is one component of a     comprehensive MRSA colonization     surveillance program. It is not     intended to diagnose MRSA     infection nor to guide or     monitor treatment for     MRSA infections.  SURGICAL PCR SCREEN     Status: None   Collection Time    01/06/13  5:56 PM      Result Value Range Status   MRSA, PCR NEGATIVE  NEGATIVE Final   Staphylococcus aureus NEGATIVE  NEGATIVE Final   Comment:            The Xpert SA Assay (FDA     approved for NASAL specimens     in patients over 63 years of age),     is one component of     a comprehensive surveillance     program.  Test performance has     been validated by [employer] for patients greater     than or equal to 11 year old.     It is not intended     to diagnose infection nor  to     guide or monitor treatment.  WOUND CULTURE     Status: None   Collection Time    01/06/13  7:58 PM      Result Value Range Status   Specimen Description ARM LEFT   Final   Special  Requests NONE   Final   Gram Stain PENDING   Incomplete   Culture NO GROWTH   Final   Report Status PENDING   Incomplete  WOUND CULTURE     Status: None   Collection Time    01/06/13  8:21 PM      Result Value Range Status   Specimen Description ARM FOREARM LEFT   Final   Special Requests NONE   Final   Gram Stain PENDING   Incomplete   Culture NO GROWTH   Final   Report Status PENDING   Incomplete     Studies: Mr Forearm Left Wo/w Cm  01/06/2013   *RADIOLOGY REPORT*  Clinical Data: Right arm cellulitis with erythema and pain.  MRI OF THE LEFT FOREARM WITHOUT AND WITH CONTRAST  Technique:  Multiplanar, multisequence MR imaging was performed both before and after administration of intravenous contrast.  Contrast: 15mL MULTIHANCE GADOBENATE DIMEGLUMINE 529 MG/ML IV SOLN  Comparison: Radiographs 12/31/2012 and ultrasound 01/05/2013.  Findings: There is extensive subcutaneous edema throughout the forearm which extends beyond the field of view into the upper arm and hand.  Posterior to the proximal ulna is a large peripherally enhancing fluid collection within the subcutaneous fat.  This demonstrates heterogeneous T2 signal and measures approximately 7.0 x 4.3 x 1.6 cm.  This likely represents infected olecranon bursitis.  This subcutaneous fluid extends proximally into the distal upper arm, incompletely visualized.  There is also ill-defined peripherally enhancing fluid within and between the proximal to mid forearm flexor musculature. The intermuscular components are ill defined but most apparent on the postcontrast axial images.  These are fairly extensive and extend from the level of the elbow into the mid forearm.  Appearance is highly concerning for myofasciitis. There is mild tenosynovitis of the flexor tendons within the distal forearm.  There is a small elbow joint effusion without suspicious synovial enhancement.  No significant effusion is identified at the wrist. There is no evidence of  osteomyelitis within the radius, ulna or distal humerus.  IMPRESSION:  1.  Findings consistent with diffuse forearm cellulitis with a complex subcutaneous fluid collection posterior to the proximal ulna, suspicious for infected olecranon bursitis. 2.  In addition, there is muscular edema with peripherally enhancing intermuscular fluid collections in the proximal forearm suspicious for myofasciitis. 3.  No evidence of intra-articular infection or osteomyelitis.  These results were called by telephone on 01/06/2013 at 0845 hours to Dr. Elvera Lennox, who verbally acknowledged these results. Orthopedic surgical consultation recommended.   Original Report Authenticated By: Carey Bullocks, M.D.   [place] Extrem Up Left Comp  01/05/2013   *RADIOLOGY REPORT*  Clinical Data: Pain and swelling.  Possible abscess.  ULTRASOUND LEFT UPPER EXTREMITY COMPLETE  Technique:  Ultrasound examination of the left forearm was performed including evaluation of the muscles, tendons, joint, and adjacent soft tissues.  Comparison:  None.  Findings: Diffuse edematous soft tissues throughout the left forearm with probable fascial fluid but no discrete abscess.  IMPRESSION: Diffuse edema and probable fascial fluid but no discrete abscess. MR would be helpful for further evaluation.   Original Report Authenticated By: Rudie Meyer, M.D.    Scheduled Meds: . aspirin EC  81 mg  Oral Daily  . clindamycin (CLEOCIN) IV  600 mg Intravenous Q8H  . insulin aspart  0-5 Units Subcutaneous QHS  . insulin aspart  0-9 Units Subcutaneous TID WC  . metoprolol tartrate  25 mg Oral BID  . polyethylene glycol  17 g Oral BID  . rivaroxaban  15 mg Oral BID WC   Followed by  . [START ON 01/28/2013] rivaroxaban  20 mg Oral Q supper  . sodium chloride  3 mL Intravenous Q12H  . vancomycin  1,000 mg Intravenous Q12H   Continuous Infusions: . 0.9 % NaCl with KCl 20 mEq / L 75 mL/hr at 01/07/13 0346    Principal Problem:   Cellulitis Active Problems:    Hyponatremia   Hypokalemia   Fasting hyperglycemia   Leukocytosis   Hypotension   Diabetes mellitus type 2, noninsulin dependent   Hypertrophic cardiomyopathy   Atrial fibrillation  Time spent: > 35 minutes  Pamella Pert  [facility] Pager [phone] If 7PM-7AM, please contact night-coverage at [email or web address], password [facility] 01/07/2013, 7:37 AM  LOS: 7 days

## 2013-01-07 NOTE — Progress Notes (Addendum)
Pt received from PACU at 2330 with L anterior forearm wound vac malfunctioning intermittently. Per [facility] PACU RN, canister changed and site trouble shooted. [employer] rep called and trouble shooted again. Arm repositioned and site assessed. [employer] machine reading "blockage". Determined to be at site and not machine after switching machines. At approximately 0200 secondary L posterior site also had intermittent "blockage" malfunctioning. RN continued to attempt to get wound vac to work by repositioning pt and manipulating tubing. Call placed to MD Dean at 0145 and 0215, call back received at 0230. MD made aware of malfunctioning wound vac sites and steps taken for trouble shooting. Per MD August Saucer ,wound consult ordered for tomorrow, continue to monitor both sites as is for tonight.   Charge nurse and [facility] made aware.  MD August Saucer also made aware of pt's low BP and no urine out put since 1800. Foley ordered and encouraged to call hospitalists regarding BP issues.   0250 NP K. Schorr made aware of low BP, 500 cc bolus ordered. ST changes on ICU heart monitor. Pt denies chest pain and SOB. 12-lead EKG done, unchanged from previous EKG this admission. Will continue to monitor.

## 2013-01-07 NOTE — Progress Notes (Signed)
Pt stable Pain l arm improved Wound vac on Motor sensory ok l hand Will need delayed primary closure sat sun by my partner Explained to patient F/u with me 2 weeks abx per primary care team

## 2013-01-07 NOTE — Op Note (Signed)
NAME:  [name], [name] NO.:  [id number]  MEDICAL RECORD NO.:  [id number]  LOCATION:  1238                         FACILITY:  [facility]  PHYSICIAN:  Burnard Bunting, M.D.    DATE OF BIRTH:  Apr 13, 1946  DATE OF PROCEDURE: DATE OF DISCHARGE:                              OPERATIVE REPORT   PREOPERATIVE DIAGNOSIS:  Left infected olecranon bursitis and myofascitis, volar forearm.  POSTOPERATIVE DIAGNOSES: 1. Myofascitis and infected olecranon bursitis with extension to the     mid portion of the triceps. 2. Myofascitis and infection in the volar aspect of the forearm.  PROCEDURE:  Extensive excision, drainage, and debridement of volar forearm and through a separate incision the olecranon bursa extending up into mid posterior triceps area.  SURGEON:  Burnard Bunting, M.D.  ASSISTANT:  None.  Cultures obtained x2 from each area.  FINDINGS:  Significant posterior fluctuant fluid collection within the olecranon bursa extending proximally along the fascial plane. __________ infected left olecranon bursitis, presents in for operative management after explanation of risks, benefits.  PROCEDURE IN DETAIL:  The patient was brought to operating room, where general endotracheal anesthesia was induced.  Preop antibiotics administered.  Time-out was called.  The patient's Tourniquet was placed and not utilized __________.  Posterior incision was made over the olecranon fossa.  Significant purulent discharge __________ within the olecranon space extending proximally along the fascial planes into the posterior aspect of the mid shaft of the __________.  Excisional debridement was performed with the curette sharply.  Cultures were obtained.  Muscle itself was viable.  Ulnar nerve was identified and protected.  Incision extended about 6 cm distal to the olecranon tip where __________.  Thorough excisional debridement was then performed sharply with curette.  6 L of irrigating  solution was then placed through this posterior incision.  Posterior incision made at approximately 30 cm.  Attention was then directed to the volar aspect of the forearm.  Incision was made __________ musculature.  Skin and subcutaneous tissues were sharply divided.  Myofascitis was encountered. __________ perioperative MRI scanning __________ imaging.  Cultures were taken in this area __________.  Muscle was viable.  Excisional debridement was performed, __________ covered with wound VAC, then dressing was placed.  The patient did have hypotension problems during the case. This was treated with Neosynephrine.  I did contact the medical service and they __________ unit.  __________.     Burnard Bunting, M.D.     GSD/MEDQ  D:  01/06/2013  T:  01/07/2013  Job:  [id number]

## 2013-01-07 NOTE — Progress Notes (Signed)
Rx Brief Anticoagulation Consult Note:  Xarelto Indication:  A-fib/DVT  Assessement:  Pt s/p exisional debridement of left elbow and left forearm. AET 2126  Xarelto to be restarted 6/6 @ 1500 per MD  Plan:  Xarelto 15mg  po twice daily with meals x 21 days (6/6-6/27) then adjust dosing to 20mg  once daily with evening meal starting 6/28.  [name] 01/07/2013 2:51 AM

## 2013-01-07 NOTE — Progress Notes (Signed)
ANTIBIOTIC CONSULT NOTE - FOLLOW UP  Pharmacy Consult for Vancomycin, Zosyn Indication: LEU cellulitis  No Known Allergies  Patient Measurements: Height: 5\' 4"  (162.6 cm) Weight: 179 lb 3.7 oz (81.3 kg) IBW/kg (Calculated) : 54.7  Vital Signs: Temp: 97.6 F (36.4 C) (06/06 1200) Temp src: Oral (06/06 1200) BP: 97/52 mmHg (06/06 1200) Pulse Rate: 82 (06/06 1200) Intake/Output from previous day: 06/05 0701 - 06/06 0700 In: 3600 [I.V.:2800; IV Piggyback:800] Out: 750 [Urine:750] Intake/Output from this shift: Total I/O In: 500 [I.V.:450; IV Piggyback:50] Out: 500 [Urine:100; Drains:400]  Labs:  Recent Labs  01/05/13 0510 01/06/13 0545 01/07/13 0339  WBC  --  26.7* 29.1*  HGB  --  10.2* 8.5*  PLT  --  372 314  CREATININE 0.73 0.63 0.67   Estimated Creatinine Clearance: 72.3 ml/min (by C-G formula based on Cr of 0.67). No results found for this basename: VANCOTROUGH, VANCOPEAK, VANCORANDOM, GENTTROUGH, GENTPEAK, GENTRANDOM, TOBRATROUGH, TOBRAPEAK, TOBRARND, AMIKACINPEAK, AMIKACINTROU, AMIKACIN,  in the last 72 hours   Microbiology: Recent Results (from the past 720 hour(s))  CULTURE, BLOOD (ROUTINE X 2)     Status: None   Collection Time    12/31/12  6:45 PM      Result Value Range Status   Specimen Description BLOOD RIGHT ARM   Final   Special Requests BOTTLES DRAWN AEROBIC AND ANAEROBIC   Final   Culture  Setup Time 12/31/2012 23:44   Final   Culture NO GROWTH 5 DAYS   Final   Report Status 01/06/2013 FINAL   Final  CULTURE, BLOOD (ROUTINE X 2)     Status: None   Collection Time    12/31/12  6:51 PM      Result Value Range Status   Specimen Description BLOOD RIGHT HAND   Final   Special Requests BOTTLES DRAWN AEROBIC AND ANAEROBIC   Final   Culture  Setup Time 12/31/2012 23:43   Final   Culture NO GROWTH 5 DAYS   Final   Report Status 01/06/2013 FINAL   Final  MRSA PCR SCREENING     Status: None   Collection Time    01/01/13  7:21 PM      Result  Value Range Status   MRSA by PCR NEGATIVE  NEGATIVE Final   Comment:            The GeneXpert MRSA Assay (FDA     approved for NASAL specimens     only), is one component of a     comprehensive MRSA colonization     surveillance program. It is not     intended to diagnose MRSA     infection nor to guide or     monitor treatment for     MRSA infections.  SURGICAL PCR SCREEN     Status: None   Collection Time    01/06/13  5:56 PM      Result Value Range Status   MRSA, PCR NEGATIVE  NEGATIVE Final   Staphylococcus aureus NEGATIVE  NEGATIVE Final   Comment:            The Xpert SA Assay (FDA     approved for NASAL specimens     in patients over 30 years of age),     is one component of     a comprehensive surveillance     program.  Test performance has     been validated by [employer] for patients greater  than or equal to 19 year old.     It is not intended     to diagnose infection nor to     guide or monitor treatment.  ANAEROBIC CULTURE     Status: None   Collection Time    01/06/13  7:58 PM      Result Value Range Status   Specimen Description ARM LEFT   Final   Special Requests NONE   Final   Gram Stain     Final   Value: MODERATE WBC PRESENT,BOTH PMN AND MONONUCLEAR     NO SQUAMOUS EPITHELIAL CELLS SEEN     NO ORGANISMS SEEN   Culture     Final   Value: NO ANAEROBES ISOLATED; CULTURE IN PROGRESS FOR 5 DAYS   Report Status PENDING   Incomplete  WOUND CULTURE     Status: None   Collection Time    01/06/13  7:58 PM      Result Value Range Status   Specimen Description ARM LEFT   Final   Special Requests NONE   Final   Gram Stain     Final   Value: RARE WBC PRESENT, PREDOMINANTLY PMN     NO SQUAMOUS EPITHELIAL CELLS SEEN     NO ORGANISMS SEEN   Culture NO GROWTH   Final   Report Status PENDING   Incomplete  WOUND CULTURE     Status: None   Collection Time    01/06/13  8:21 PM      Result Value Range Status   Specimen Description ARM FOREARM LEFT    Final   Special Requests NONE   Final   Gram Stain     Final   Value: FEW WBC PRESENT,BOTH PMN AND MONONUCLEAR     NO SQUAMOUS EPITHELIAL CELLS SEEN     RARE GRAM POSITIVE COCCI     IN PAIRS   Culture NO GROWTH   Final   Report Status PENDING   Incomplete  ANAEROBIC CULTURE     Status: None   Collection Time    01/06/13  8:21 PM      Result Value Range Status   Specimen Description ARM FOREARM LEFT   Final   Special Requests NONE   Final   Gram Stain     Final   Value: RARE WBC PRESENT, PREDOMINANTLY PMN     NO SQUAMOUS EPITHELIAL CELLS SEEN     NO ORGANISMS SEEN   Culture     Final   Value: NO ANAEROBES ISOLATED; CULTURE IN PROGRESS FOR 5 DAYS   Report Status PENDING   Incomplete    Anti-infectives   Start     Dose/Rate Route Frequency Ordered Stop   01/07/13 1500  piperacillin-tazobactam (ZOSYN) IVPB 3.375 g     3.375 g 12.5 mL/hr over 240 Minutes Intravenous 3 times per day 01/07/13 1429     01/06/13 2359  vancomycin (VANCOCIN) IVPB 1000 mg/200 mL premix     1,000 mg 200 mL/hr over 60 Minutes Intravenous Every 12 hours 01/06/13 2308     01/05/13 0900  clindamycin (CLEOCIN) IVPB 600 mg  Status:  Discontinued     600 mg 100 mL/hr over 30 Minutes Intravenous Every 8 hours 01/05/13 0809 01/07/13 1415   01/03/13 1200  sulfamethoxazole-trimethoprim (BACTRIM DS) 800-160 MG per tablet 1 tablet  Status:  Discontinued     1 tablet Oral Every 12 hours 01/03/13 1134 01/05/13 0809   01/02/13 0900  piperacillin-tazobactam (ZOSYN) IVPB 3.375 g  Status:  Discontinued     3.375 g 12.5 mL/hr over 240 Minutes Intravenous Every 8 hours 01/02/13 0830 01/03/13 1134   01/01/13 2200  sulfamethoxazole-trimethoprim (BACTRIM DS) 800-160 MG per tablet 1 tablet  Status:  Discontinued     1 tablet Oral Every 12 hours 01/01/13 1858 01/02/13 0822   01/01/13 0600  vancomycin (VANCOCIN) IVPB 750 mg/150 ml premix  Status:  Discontinued     750 mg 150 mL/hr over 60 Minutes Intravenous Every 12 hours  12/31/12 1815 01/03/13 1134   01/01/13 0200  piperacillin-tazobactam (ZOSYN) IVPB 3.375 g  Status:  Discontinued     3.375 g 12.5 mL/hr over 240 Minutes Intravenous Every 8 hours 12/31/12 1815 01/01/13 1839   12/31/12 1830  piperacillin-tazobactam (ZOSYN) IVPB 3.375 g     3.375 g 100 mL/hr over 30 Minutes Intravenous  Once 12/31/12 1806 12/31/12 1931   12/31/12 1830  vancomycin (VANCOCIN) IVPB 750 mg/150 ml premix  Status:  Discontinued     750 mg 150 mL/hr over 60 Minutes Intravenous  Once 12/31/12 1806 01/02/13 0933   12/31/12 1615  clindamycin (CLEOCIN) IVPB 600 mg  Status:  Discontinued     600 mg 100 mL/hr over 30 Minutes Intravenous  Once 12/31/12 1608 12/31/12 1800     Assessment:  67 yo with history of diabetes c/o L arm pain. Pt noticed bug bite 6 days PTA that developed into cellulitis. Pt placed on Bactrim and Keflex 2 days PTA with some improvement in cellulitis. Admit 5/30 with dehydration and hypotension. Begin Vancomycin/Zosyn for LUE cellulitis  Infection worsened after switch to Bactrim, s/p excisional debridement 6/5. MRI: infectious olecranon bursitis, rule out myofasciitis.  Resumed Vanc yesterday, resume Zosyn today  Antibiotics: 5/31 >> vanc >> 6/2 5/30 >>zosyn >> 6/2 6/2 >> Bactrim DS >> 6/4 6/4 >> clinda 6/5 >> vancomycin >> 6/6 >> Zosyn >>  Goal of Therapy:  Vancomycin trough level 15-20 mcg/ml  Plan:  Continue Vancomycin 1gm q12, trough at steady state if warranted Zosyn 3.375gm q8h, 4 hr infusion Wound cx 6/5 pending  Otho Bellows PharmD Pager [phone] 01/07/2013, 2:42 PM.

## 2013-01-07 NOTE — Consult Note (Signed)
WOC contacted about VAC dressing on the lateral left arm wound.  ICU staff report that this dressing has been overcome with sanguinous drainage. Soaked thru ACE wrap and machine is alarming blockage alarm.  I have come to the bedside and the dressing again appears like it did when I arrived this am, except that it does not appear to have clot yet.  Has large collection of blood under the drape and the sponge is overcome with blood, it is leaking outside the drape.  ICU bedside nurse has contacted Dr. August Saucer, and I have LVM on cell phone for MD to explain the situation.  ICU nurse awaiting call back from MD with orders.  Nicle Connole [place] RN,CWOCN [phone]

## 2013-01-07 NOTE — Progress Notes (Signed)
Subjective:  Events since yesterday reviewed.  Had surgery last evening to drain infected bursa.  Notes of mild hypotension during surgery.  Currently c/o pain in left arm and had nausea and some vomiting last night.  No chest pain.  Says that oxygen helps her breathing.  Verapamil was d/c last night.   Objective:  Vital Signs in the last 24 hours: BP 114/56  Pulse 85  Temp(Src) 97.7 F (36.5 C) (Oral)  Resp 16  Ht 5\' 4"  (1.626 m)  Wt 81.3 kg (179 lb 3.7 oz)  BMI 30.75 kg/m2  SpO2 98%  Physical Exam: Obese white female in no acute distress Lungs:  Reduced breath sounds Cardiac:  Regular rhythm, normal S1 and S2, no S3, soft systolic murmur appreciated today Extremities:  Left arm bandaged  Intake/Output from previous day: 06/05 0701 - 06/06 0700 In: 3525 [I.V.:2725; IV Piggyback:800] Out: 750 [Urine:750]  Weight Filed Weights   12/31/12 2010 01/01/13 1900 01/07/13 0015  Weight: 76.567 kg (168 lb 12.8 oz) 79.6 kg (175 lb 7.8 oz) 81.3 kg (179 lb 3.7 oz)    Lab Results: Basic Metabolic Panel:  Recent Labs  16/10/96 0545 01/07/13 0339  NA 125* 122*  K 4.5 4.2  CL 92* 91*  CO2 23 23  GLUCOSE 211* 172*  BUN 20 17  CREATININE 0.63 0.67    CBC:  Recent Labs  01/06/13 0545 01/07/13 0339  WBC 26.7* 29.1*  HGB 10.2* 8.5*  HCT 31.3* 25.5*  MCV 86.0 85.9  PLT 372 314   BNP    Component Value Date/Time   PROBNP 6103.0* 01/02/2013 0354    Telemetry: Sinus rhythm  ECHO Hypertrophic cardiomyopathy with mild SAM of anterior leaflet of MV, Moderate to severe LA enlargement  Assessment/Plan:  1. Hypotension during surgery could have been anesthesia or relative intracavitary obstruction from HCM if was volume depleted or vasodilated.  BP is stable this am and UOP OK 2. 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 3. Atrial fibrillation no recurrence 4. Hyponatremia that is significant at this time. 5. DVT of left  arm  Recommendations:  BP is stable this am.  EKG this am showed NSR and nonspecific changes.  I would keep her off of Verapamil for now, but consider restart when out from OR as bit.  Back on Xarelto for upper arm DVT.   Darden Palmer  MD [facility]  01/07/2013, 8:26 AM

## 2013-01-07 NOTE — Progress Notes (Signed)
PT Cancellation Note  Patient Details Name: [name] MRN: [id number] DOB: 1946-05-02   Cancelled Treatment:    Reason Eval/Treat Not Completed: Medical issues which prohibited therapy, attempted to evaluate this am, pt nauseated. Now has bleeding at wounds. Plan return tomorrow if medically ready.   [name] 01/07/2013, 1:36 PM Blanchard Kelch PT [phone]

## 2013-01-07 NOTE — Progress Notes (Signed)
Subjective: 1 Day Post-Op Procedure(s) (LRB): IRRIGATION AND DEBRIDEMENT LEFT ELBOW AND LEFT FOREARM  (Left) APPLICATION OF WOUND VAC X 2 (Left) Patient reports pain as 6 on 0-10 scale.    Objective: Vital signs in last 24 hours: Temp:  [97 F (36.1 C)-97.7 F (36.5 C)] 97.6 F (36.4 C) (06/06 1200) Pulse Rate:  [74-88] 82 (06/06 1200) Resp:  [14-24] 16 (06/06 1200) BP: (79-122)/(39-90) 97/52 mmHg (06/06 1200) SpO2:  [96 %-100 %] 99 % (06/06 1200) Weight:  [81.3 kg (179 lb 3.7 oz)] 81.3 kg (179 lb 3.7 oz) (06/06 0015)  Intake/Output from previous day: 06/05 0701 - 06/06 0700 In: 3600 [I.V.:2800; IV Piggyback:800] Out: 750 [Urine:750] Intake/Output this shift: Total I/O In: 500 [I.V.:450; IV Piggyback:50] Out: 500 [Urine:100; Drains:400]   Recent Labs  01/06/13 0545 01/07/13 0339  HGB 10.2* 8.5*    Recent Labs  01/06/13 0545 01/07/13 0339  WBC 26.7* 29.1*  RBC 3.64* 2.97*  HCT 31.3* 25.5*  PLT 372 314    Recent Labs  01/06/13 0545 01/07/13 0339  NA 125* 122*  K 4.5 4.2  CL 92* 91*  CO2 23 23  BUN 20 17  CREATININE 0.63 0.67  GLUCOSE 211* 172*  CALCIUM 8.4 8.0*   No results found for this basename: LABPT, INR,  in the last 72 hours  Neurologically intact ABD soft Neurovascular intact Sensation intact distally Intact pulses distally Incision: moderate drainage and bleeding with wound vac with large amount of blood blistering Compartment soft Rx: Discontinue the wound vac to the posterior left elbow, normal saline wet to dry Keep wound vac intact to the antecubital area but no negative pressure clamping the out Flow tube to keep a sterile dressing. Will return to OR sat or sun Assessment/Plan: 1 Day Post-Op Procedure(s) (LRB): IRRIGATION AND DEBRIDEMENT LEFT ELBOW AND LEFT FOREARM  (Left) APPLICATION OF WOUND VAC X 2 (Left)  Advance diet Continue ABX therapy due to Post-op infection Reinforce dressings on the outside of current bandage with  ABDs and kerlix if necessary. Elevate the left arm on pillows to decrease swelling and venous dependency. Skyler Dusing E 01/07/2013, 3:24 PM

## 2013-01-07 NOTE — Progress Notes (Signed)
CARE MANAGEMENT NOTE 01/07/2013  Patient:  [name], [name]   Account Number:  [id number]  Date Initiated:  01/04/2013  Documentation initiated by:  Lanier Clam  Subjective/Objective Assessment:   ADMITTED LUE CELLULITIS.     Action/Plan:   FROM HOME W/FAMILY.HAS PCP,PHARMACY.   Anticipated DC Date:  01/10/2013   Anticipated DC Plan:  HOME/SELF CARE      DC Planning Services  CM consult      Choice offered to / List presented to:             Status of service:  In process, will continue to follow Medicare Important Message given?   (If response is "NO", the following Medicare IM given date fields will be blank) Date Medicare IM given:   Date Additional Medicare IM given:    Discharge Disposition:    Per UR Regulation:  Reviewed for med. necessity/level of care/duration of stay  If discussed at Long Length of Stay Meetings, dates discussed:    Comments:  [id number]/Rhonda Earlene Plater, RN, BSN, CCM: CHART REVIEWED AND UPDATEDPatient to or pm of [id number] debridement of abcesses and area of cellulitis, hypotensive post op,sepsis, and dvt.  Next chart review due on [id number]. NO DISCHARGE NEEDS PRESENT AT THIS TIME. CASE MANAGEMENT [phone]   01/04/13 KATHY MAHABIR RN,BSN NCM [phone] TRANSFER FROM SDU.?ABSCESS,DOPPLER,SCAN.

## 2013-01-08 ENCOUNTER — Inpatient Hospital Stay (HOSPITAL_COMMUNITY): Payer: Medicare Other

## 2013-01-08 DIAGNOSIS — I421 Obstructive hypertrophic cardiomyopathy: Secondary | ICD-10-CM

## 2013-01-08 DIAGNOSIS — D62 Acute posthemorrhagic anemia: Secondary | ICD-10-CM

## 2013-01-08 LAB — CBC
Hemoglobin: 7.5 g/dL — ABNORMAL LOW (ref 12.0–15.0)
MCH: 29.8 pg (ref 26.0–34.0)
Platelets: 323 10*3/uL (ref 150–400)
RBC: 2.52 MIL/uL — ABNORMAL LOW (ref 3.87–5.11)
WBC: 23.4 10*3/uL — ABNORMAL HIGH (ref 4.0–10.5)

## 2013-01-08 LAB — GLUCOSE, CAPILLARY: Glucose-Capillary: 191 mg/dL — ABNORMAL HIGH (ref 70–99)

## 2013-01-08 LAB — BASIC METABOLIC PANEL
CO2: 24 mEq/L (ref 19–32)
Calcium: 7.6 mg/dL — ABNORMAL LOW (ref 8.4–10.5)
Chloride: 98 mEq/L (ref 96–112)
Potassium: 4.3 mEq/L (ref 3.5–5.1)
Sodium: 129 mEq/L — ABNORMAL LOW (ref 135–145)

## 2013-01-08 MED ORDER — FUROSEMIDE 10 MG/ML IJ SOLN
INTRAMUSCULAR | Status: AC
  Filled 2013-01-08: qty 4

## 2013-01-08 MED ORDER — FUROSEMIDE 10 MG/ML IJ SOLN
20.0000 mg | Freq: Once | INTRAMUSCULAR | Status: AC
  Administered 2013-01-08: 20 mg via INTRAVENOUS

## 2013-01-08 MED ORDER — DIPHENHYDRAMINE HCL 25 MG PO CAPS
25.0000 mg | ORAL_CAPSULE | Freq: Once | ORAL | Status: AC
  Administered 2013-01-08: 25 mg via ORAL
  Filled 2013-01-08: qty 1

## 2013-01-08 MED ORDER — ACETAMINOPHEN 325 MG PO TABS
650.0000 mg | ORAL_TABLET | Freq: Once | ORAL | Status: AC
  Administered 2013-01-08: 650 mg via ORAL
  Filled 2013-01-08: qty 2

## 2013-01-08 NOTE — Evaluation (Signed)
Physical Therapy Evaluation Patient Details Name: [name] MRN: [id number] DOB: [birth year] Today's Date: 01/08/2013 Time: 4098-1191 PT Time Calculation (min): 28 min  PT Assessment / Plan / Recommendation Clinical Impression  pt adm with cellulitis, s/p I and D of LUE and now with limitations with mobility due to medical complications; will benefit fro PT to maximize indepedence for  return home    PT Assessment  Patient needs continued PT services    Follow Up Recommendations  Home health PT;Supervision - Intermittent    Does the patient have the potential to tolerate intense rehabilitation      Barriers to Discharge        Equipment Recommendations  Other (comment) (TBA)    Recommendations for Other Services     Frequency Min 3X/week    Precautions / Restrictions Precautions Precautions: Fall Precaution Comments: multiple lines   Pertinent Vitals/Pain Pain LUE, not rated, RN aware BP supine 125/52 BP sitting 79/33 2nd sitting 89/40 BP supine 112/50 Pt dizzy; returned to supine due to above BPs, RN present     Mobility  Bed Mobility Bed Mobility: Supine to Sit;Sit to Supine;Sitting - Scoot to Edge of Bed Supine to Sit: 4: Min assist;HOB elevated Sitting - Scoot to [place] of Bed: 4: Min assist Sit to Supine: 1: +2 Total assist Sit to Supine: Patient Percentage: 70% Details for Bed Mobility Assistance: cues for safety, allow time for line management Transfers Transfers: Not assessed Ambulation/Gait Ambulation/Gait Assistance: Not tested (comment) (due  to low BP)    Exercises     PT Diagnosis: Difficulty walking  PT Problem List: Decreased strength;Decreased range of motion;Decreased activity tolerance;Decreased mobility;Decreased knowledge of use of DME;Pain PT Treatment Interventions: Gait training;DME instruction;Functional mobility training;Therapeutic activities;Therapeutic exercise;Balance training;Patient/family education   PT Goals Acute  Rehab PT Goals PT Goal Formulation: With patient Time For Goal Achievement: 01/29/13 Potential to Achieve Goals: Good Pt will go Supine/Side to Sit: with modified independence PT Goal: Supine/Side to Sit - Progress: Goal set today Pt will go Sit to Stand: with modified independence PT Goal: Sit to Stand - Progress: Goal set today Pt will go Stand to Sit: with modified independence PT Goal: Stand to Sit - Progress: Goal set today Pt will Ambulate: >150 feet;with supervision;with least restrictive assistive device PT Goal: Ambulate - Progress: Goal set today  Visit Information  Last PT Received On: 01/08/13 Assistance Needed: +2 (lines)    Subjective Data  Subjective: I sat up last night Patient Stated Goal: home   Prior Functioning  Home Living Lives With: Other (Comment) Type of Home: House Home Access: Level entry Home Layout: Two level;Able to live on main level with bedroom/bathroom Alternate Level Stairs-Number of Steps: 14 Home Adaptive Equipment: None Prior Function Level of Independence: Independent Able to Take Stairs?: Yes Communication Communication: No difficulties    Cognition  Cognition Arousal/Alertness: Awake/alert Behavior During Therapy:  (easily agitated with questions) Overall Cognitive Status: Within Functional Limits for tasks assessed    Extremity/Trunk Assessment     Balance Static Sitting Balance Static Sitting - Balance Support: Bilateral upper extremity supported;No upper extremity supported;Feet supported Static Sitting - Level of Assistance: 5: Stand by assistance Static Sitting - Comment/# of Minutes: EOB x 9 minutes; pt doing ankle pumps and a few LAQs; dizzy and orthostatic  End of Session PT - End of Session Activity Tolerance: Treatment limited secondary to medical complications (Comment) (orthostatic) Patient left: in bed;with call bell/phone within reach;with family/visitor present Nurse Communication: Mobility status  GP      [facility] 01/08/2013, 4:32 PM

## 2013-01-08 NOTE — Progress Notes (Signed)
Subjective:  No significant chest pain. Not dyspneic.  BP remains low.  Objective:  Vital Signs in the last 24 hours: BP 114/48  Pulse 87  Temp(Src) 98.3 F (36.8 C) (Oral)  Resp 21  Ht 5\' 4"  (1.626 m)  Wt 186 lb 15.2 oz (84.8 kg)  BMI 32.07 kg/m2  SpO2 96%  Physical Exam: Obese white female in no acute distress Lungs:  Reduced breath sounds Cardiac:  Regular rhythm, normal S1 and S2, no S3, grade 2/6 systolic murmur at LSE. Extremities:  Left arm bandaged  Intake/Output from previous day: 06/06 0701 - 06/07 0700 In: 2762.5 [I.V.:1625; Blood:312.5; IV Piggyback:825] Out: 2250 [Urine:1850; Drains:400]  Weight Filed Weights   01/01/13 1900 01/07/13 0015 01/08/13 0400  Weight: 175 lb 7.8 oz (79.6 kg) 179 lb 3.7 oz (81.3 kg) 186 lb 15.2 oz (84.8 kg)    Lab Results: Basic Metabolic Panel:  Recent Labs  96/04/54 0339 01/08/13 0630  NA 122* 129*  K 4.2 4.3  CL 91* 98  CO2 23 24  GLUCOSE 172* 140*  BUN 17 10  CREATININE 0.67 0.64    CBC:  Recent Labs  01/07/13 0339 01/07/13 2005 01/08/13 0630  WBC 29.1*  --  23.4*  HGB 8.5* 7.3* 7.5*  HCT 25.5* 21.5* 22.1*  MCV 85.9  --  87.7  PLT 314  --  323   BNP    Component Value Date/Time   PROBNP 6103.0* 01/02/2013 0354    Telemetry: Sinus rhythm  ECHO Hypertrophic cardiomyopathy with mild SAM of anterior leaflet of MV, Moderate to severe LA enlargement  Assessment/Plan:  1. Hypotension during surgery could have been anesthesia or relative intracavitary obstruction from HCM if was volume depleted or vasodilated.  BP remains low. 2. 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 3. Atrial fibrillation no recurrence 4. Hyponatremia improving 5. DVT of left arm  Recommendations:  BP is still low. Would make sure intravascular volume is adequate in view of her IHSS. Hgb down to 7.5 contributing to hypotension.  Back on Xarelto for upper arm DVT. Would not restart verapamil  yet.   Cassell Clement MD  Cardiology  01/08/2013, 8:04 AM

## 2013-01-08 NOTE — Progress Notes (Signed)
Subjective: 2 Days Post-Op Procedure(s) (LRB): IRRIGATION AND DEBRIDEMENT LEFT ELBOW AND LEFT FOREARM  (Left) APPLICATION OF WOUND VAC X 2 (Left) Awake alert oriented x4. A VAC removed from the left posterior arm elbow and forearm yesterday. Sac left in place over the anterior left elbow however clamped off to decrease blood loss. Hemoglobin decreased last night she was given 1 unit transfusion. This morning hemoglobin remains reduced at 7.5 and as she is preoperative for a repeat incision drainage and debridement with delayed primary closure we will transfuse 2 additional units for hemoglobin 7.5 targeting a hemoglobin of 9.5-10 as a preoperative value. The discomfort is moderate. She is tolerating by mouth diet at this point and we will make her n.p.o. past midnight. OR schedule for 8 AM 01/09/2013. Patient reports pain as 4 on 0-10 scale.    Objective: Vital signs in last 24 hours: Temp:  [97.6 F (36.4 C)-98.5 F (36.9 C)] 98.5 F (36.9 C) (06/07 0800) Pulse Rate:  [81-96] 87 (06/07 0800) Resp:  [15-21] 17 (06/07 0800) BP: (86-122)/(40-58) 96/40 mmHg (06/07 0800) SpO2:  [96 %-99 %] 97 % (06/07 0800) Weight:  [84.8 kg (186 lb 15.2 oz)] 84.8 kg (186 lb 15.2 oz) (06/07 0400)  Intake/Output from previous day: 06/06 0701 - 06/07 0700 In: 2850 [I.V.:1700; Blood:312.5; IV Piggyback:837.5] Out: 2250 [Urine:1850; Drains:400] Intake/Output this shift: Total I/O In: 87.5 [I.V.:75; IV Piggyback:12.5] Out: -    Recent Labs  01/06/13 0545 01/07/13 0339 01/07/13 2005 01/08/13 0630  HGB 10.2* 8.5* 7.3* 7.5*    Recent Labs  01/07/13 0339 01/07/13 2005 01/08/13 0630  WBC 29.1*  --  23.4*  RBC 2.97*  --  2.52*  HCT 25.5* 21.5* 22.1*  PLT 314  --  323    Recent Labs  01/07/13 0339 01/08/13 0630  NA 122* 129*  K 4.2 4.3  CL 91* 98  CO2 23 24  BUN 17 10  CREATININE 0.67 0.64  GLUCOSE 172* 140*  CALCIUM 8.0* 7.6*   No results found for this basename: LABPT, INR,  in the  last 72 hours  Neurologically intact ABD soft Neurovascular intact Sensation intact distally Intact pulses distally Incision: dressing C/D/I  Assessment/Plan: 2 Days Post-Op Procedure(s) (LRB): IRRIGATION AND DEBRIDEMENT LEFT ELBOW AND LEFT FOREARM  (Left) APPLICATION OF WOUND VAC X 2 (Left) Acute blood loss anemia.  NPO past midnight tonight for OR Sunday At 8 AM. Repeat Incision, excisional debridement and delayed primary closure of incision sites left anterior elbow and posterior Arm, elbow and proximal forearm. Advance diet Then NPO tonight. Hold lovenox today and restart post surgery tomorrow AM.  Danielle Lento E 01/08/2013, 9:28 AM

## 2013-01-08 NOTE — Progress Notes (Signed)
Patient alert and oriented.  Patient experienced sinus tachycardia with frequent PACs, at a rate of 130, for about two minutes.  The patient denied an chest pain or shortness of breath.   The tachycardia self resolved.  The 2200 dose of PO metoprolol was administered before tachycardia event.   Lynch, PA notified of tachycardia.  No new orders given. Dr. Otelia Sergeant called regarding patient's OR schedule, and gave a verbal order for the patient to be NPO after 0200am due to delay in scheduled surgery time.  Dr. Otelia Sergeant notified of tachycardia event.  No new orders given regarding heart rate.  Will continue to monitor and patient to be NPO after 0200am.  Kinnie Feil, RN

## 2013-01-08 NOTE — Progress Notes (Addendum)
[facility] PROGRESS NOTE  [name] YNW:[id number] DOB: Sep 02, 1945 DOA: 12/31/2012 PCP: [name], MD  67 y/o CF with history of DM presenting with LUE cellulitis.  Initially hypotensive has improved after broad spectrum antibiotics and IVF's. Developed atrial flutter and cardiology subsequently consulted.  Atrial flutter/fibrillation controlled with B blocker currently. Patient transitioned from zosyn/vanc to bactrim. WBC slightly up this am.  Ultrasound of LUE to assess for abscesses or DVT has been ordered.  Assessment/Plan  Cellulitis/left olecranon bursitis - continue Vancomycin/Zosyn  - MRI left elbow showed infected olecranon bursitis and suspect myofasciitis - ortho consulted, s/p excisional debridement left elbow and forearm.  - Hypotensive after surgery thus was transferred in SDU. Left arm DVT- started Xarelto 6/4, on hold in the day of surgery, restarted.  Atrial flutter - Heart Rate has been well controlled. Given hypotension, will use Metop. On Xarelto. Hyponatremia - TSH borderline, normal free T4; cortisol appropriately elevated. Monitor. Improving.  Constipation- abdominal film today Hypokalemia - Resolved  Hypotension - in SDU, continue IVF DM II - continue SSI  ABLA - monitor and transfuse as needed.  Code Status: Full code  Family Communication: none Disposition Plan: home when medically stable.   Consultants:  Cardiology Orthopedic surgery  Procedures:  None  Antibiotics:  Vancomycin and Zosyn 5/30 to 6/2 Bactrim 6/02 - 6/4 Clindamycin 6/4 >> 6/6 Vancomycin 6/6 >>  Zosyn 6/6 >>  HPI/Subjective: - feeling overwhelmed about everything, crying this morning. Moderate pain in elbow. - still constipated  Objective: Filed Vitals:   01/08/13 0300 01/08/13 0400 01/08/13 0500 01/08/13 0600  BP: 96/50 100/49 110/45 114/48  Pulse: 84 81 84 87  Temp:  98.3 F (36.8 C)    TempSrc:  Oral    Resp: 18 18 17 21   Height:      Weight:  84.8  kg (186 lb 15.2 oz)    SpO2: 97% 97% 97% 96%    Intake/Output Summary (Last 24 hours) at 01/08/13 0745 Last data filed at 01/08/13 0600  Gross per 24 hour  Intake 2262.5 ml  Output   2250 ml  Net   12.5 ml   Filed Weights   01/01/13 1900 01/07/13 0015 01/08/13 0400  Weight: 79.6 kg (175 lb 7.8 oz) 81.3 kg (179 lb 3.7 oz) 84.8 kg (186 lb 15.2 oz)   Exam:  General:  Pt in NAD, Alert and awake, appears sad  Cardiovascular: RRR, no MRG  Respiratory: CTA BL, no wheezes  Abdomen: soft, NT, ND  Musculoskeletal: no cyanosis or clubbing; left elbow dressing in place  Data Reviewed: Basic Metabolic Panel:  Recent Labs Lab 01/03/13 0856 01/05/13 0510 01/06/13 0545 01/07/13 0339 01/08/13 0630  NA 127* 123* 125* 122* 129*  K 4.0 4.1 4.5 4.2 4.3  CL 93* 90* 92* 91* 98  CO2 24 22 23 23 24   GLUCOSE 215* 206* 211* 172* 140*  BUN 25* 24* 20 17 10   CREATININE 0.88 0.73 0.63 0.67 0.64  CALCIUM 8.4 8.5 8.4 8.0* 7.6*   Liver Function Tests:  Recent Labs Lab 01/02/13 0354  AST 18  ALT 19  ALKPHOS 230*  BILITOT 0.5  PROT 6.0  ALBUMIN 2.1*   CBC:  Recent Labs Lab 01/03/13 0856 01/06/13 0545 01/07/13 0339 01/07/13 2005 01/08/13 0630  WBC 20.4* 26.7* 29.1*  --  23.4*  HGB 11.0* 10.2* 8.5* 7.3* 7.5*  HCT 32.6* 31.3* 25.5* 21.5* 22.1*  MCV 86.9 86.0 85.9  --  87.7  PLT 404* 372 314  --  323   BNP (last 3 results)  Recent Labs  01/02/13 0354  PROBNP 6103.0*   CBG:  Recent Labs Lab 01/06/13 1637 01/06/13 2204 01/07/13 0810 01/07/13 1154 01/07/13 1702  GLUCAP 137* 136* 151* 129* 118*    Recent Results (from the past 240 hour(s))  CULTURE, BLOOD (ROUTINE X 2)     Status: None   Collection Time    12/31/12  6:45 PM      Result Value Range Status   Specimen Description BLOOD RIGHT ARM   Final   Special Requests BOTTLES DRAWN AEROBIC AND ANAEROBIC   Final   Culture  Setup Time 12/31/2012 23:44   Final   Culture NO GROWTH 5 DAYS   Final   Report  Status 01/06/2013 FINAL   Final  CULTURE, BLOOD (ROUTINE X 2)     Status: None   Collection Time    12/31/12  6:51 PM      Result Value Range Status   Specimen Description BLOOD RIGHT HAND   Final   Special Requests BOTTLES DRAWN AEROBIC AND ANAEROBIC   Final   Culture  Setup Time 12/31/2012 23:43   Final   Culture NO GROWTH 5 DAYS   Final   Report Status 01/06/2013 FINAL   Final  MRSA PCR SCREENING     Status: None   Collection Time    01/01/13  7:21 PM      Result Value Range Status   MRSA by PCR NEGATIVE  NEGATIVE Final   Comment:            The GeneXpert MRSA Assay (FDA     approved for NASAL specimens     only), is one component of a     comprehensive MRSA colonization     surveillance program. It is not     intended to diagnose MRSA     infection nor to guide or     monitor treatment for     MRSA infections.  SURGICAL PCR SCREEN     Status: None   Collection Time    01/06/13  5:56 PM      Result Value Range Status   MRSA, PCR NEGATIVE  NEGATIVE Final   Staphylococcus aureus NEGATIVE  NEGATIVE Final   Comment:            The Xpert SA Assay (FDA     approved for NASAL specimens     in patients over 52 years of age),     is one component of     a comprehensive surveillance     program.  Test performance has     been validated by [employer] for patients greater     than or equal to 26 year old.     It is not intended     to diagnose infection nor to     guide or monitor treatment.  ANAEROBIC CULTURE     Status: None   Collection Time    01/06/13  7:58 PM      Result Value Range Status   Specimen Description ARM LEFT   Final   Special Requests NONE   Final   Gram Stain     Final   Value: MODERATE WBC PRESENT,BOTH PMN AND MONONUCLEAR     NO SQUAMOUS EPITHELIAL CELLS SEEN     NO ORGANISMS SEEN   Culture     Final   Value: NO ANAEROBES ISOLATED; CULTURE IN PROGRESS  FOR 5 DAYS   Report Status PENDING   Incomplete  WOUND CULTURE     Status: None    Collection Time    01/06/13  7:58 PM      Result Value Range Status   Specimen Description ARM LEFT   Final   Special Requests NONE   Final   Gram Stain     Final   Value: RARE WBC PRESENT, PREDOMINANTLY PMN     NO SQUAMOUS EPITHELIAL CELLS SEEN     NO ORGANISMS SEEN   Culture NO GROWTH 1 DAY   Final   Report Status PENDING   Incomplete  WOUND CULTURE     Status: None   Collection Time    01/06/13  8:21 PM      Result Value Range Status   Specimen Description ARM FOREARM LEFT   Final   Special Requests NONE   Final   Gram Stain     Final   Value: FEW WBC PRESENT,BOTH PMN AND MONONUCLEAR     NO SQUAMOUS EPITHELIAL CELLS SEEN     RARE GRAM POSITIVE COCCI     IN PAIRS   Culture NO GROWTH   Final   Report Status PENDING   Incomplete  ANAEROBIC CULTURE     Status: None   Collection Time    01/06/13  8:21 PM      Result Value Range Status   Specimen Description ARM FOREARM LEFT   Final   Special Requests NONE   Final   Gram Stain     Final   Value: RARE WBC PRESENT, PREDOMINANTLY PMN     NO SQUAMOUS EPITHELIAL CELLS SEEN     NO ORGANISMS SEEN   Culture     Final   Value: NO ANAEROBES ISOLATED; CULTURE IN PROGRESS FOR 5 DAYS   Report Status PENDING   Incomplete     Studies: No results found.  Scheduled Meds: . aspirin EC  81 mg Oral Daily  . insulin aspart  0-5 Units Subcutaneous QHS  . insulin aspart  0-9 Units Subcutaneous TID WC  . metoprolol tartrate  25 mg Oral BID  . piperacillin-tazobactam (ZOSYN)  IV  3.375 g Intravenous Q8H  . polyethylene glycol  17 g Oral BID  . rivaroxaban  15 mg Oral BID WC   Followed by  . [START ON 01/28/2013] rivaroxaban  20 mg Oral Q supper  . sodium chloride  3 mL Intravenous Q12H  . vancomycin  1,000 mg Intravenous Q12H   Continuous Infusions: . 0.9 % NaCl with KCl 20 mEq / L 75 mL/hr at 01/08/13 0340    Principal Problem:   Cellulitis Active Problems:   Hyponatremia   Hypokalemia   Fasting hyperglycemia   Leukocytosis    Hypotension   Diabetes mellitus type 2, noninsulin dependent   Hypertrophic cardiomyopathy   Atrial fibrillation  Time spent: > 35 minutes  Pamella Pert  [facility] Pager [phone] If 7PM-7AM, please contact night-coverage at [email or web address], password [facility] 01/08/2013, 7:45 AM  LOS: 8 days

## 2013-01-09 ENCOUNTER — Inpatient Hospital Stay (HOSPITAL_COMMUNITY): Payer: Medicare Other | Admitting: Anesthesiology

## 2013-01-09 ENCOUNTER — Encounter (HOSPITAL_COMMUNITY): Payer: Self-pay | Admitting: Specialist

## 2013-01-09 ENCOUNTER — Encounter (HOSPITAL_COMMUNITY): Payer: Self-pay | Admitting: Anesthesiology

## 2013-01-09 ENCOUNTER — Encounter (HOSPITAL_COMMUNITY)
Admission: EM | Disposition: A | Discharge status: Skilled Nursing Facility | Payer: Self-pay | Source: Home / Self Care | Attending: Family Medicine

## 2013-01-09 DIAGNOSIS — I498 Other specified cardiac arrhythmias: Secondary | ICD-10-CM

## 2013-01-09 DIAGNOSIS — M7020 Olecranon bursitis, unspecified elbow: Secondary | ICD-10-CM

## 2013-01-09 DIAGNOSIS — IMO0001 Reserved for inherently not codable concepts without codable children: Secondary | ICD-10-CM

## 2013-01-09 HISTORY — PX: SECONDARY CLOSURE OF WOUND: SHX6208

## 2013-01-09 HISTORY — PX: APPLICATION OF WOUND VAC: SHX5189

## 2013-01-09 HISTORY — PX: INCISION AND DRAINAGE: SHX5863

## 2013-01-09 LAB — WOUND CULTURE: Culture: NO GROWTH

## 2013-01-09 LAB — BASIC METABOLIC PANEL
BUN: 12 mg/dL (ref 6–23)
Chloride: 97 mEq/L (ref 96–112)
Glucose, Bld: 198 mg/dL — ABNORMAL HIGH (ref 70–99)
Potassium: 4.2 mEq/L (ref 3.5–5.1)

## 2013-01-09 LAB — TYPE AND SCREEN
ABO/RH(D): A NEG
Unit division: 0
Unit division: 0

## 2013-01-09 LAB — CBC WITH DIFFERENTIAL/PLATELET
Eosinophils Absolute: 0.3 10*3/uL (ref 0.0–0.7)
HCT: 29.5 % — ABNORMAL LOW (ref 36.0–46.0)
Hemoglobin: 10.1 g/dL — ABNORMAL LOW (ref 12.0–15.0)
Lymphs Abs: 2.2 10*3/uL (ref 0.7–4.0)
MCH: 30.1 pg (ref 26.0–34.0)
Monocytes Absolute: 1.1 10*3/uL — ABNORMAL HIGH (ref 0.1–1.0)
Monocytes Relative: 6 % (ref 3–12)
Neutro Abs: 16.1 10*3/uL — ABNORMAL HIGH (ref 1.7–7.7)
Neutrophils Relative %: 82 % — ABNORMAL HIGH (ref 43–77)
RBC: 3.36 MIL/uL — ABNORMAL LOW (ref 3.87–5.11)

## 2013-01-09 LAB — GLUCOSE, CAPILLARY
Glucose-Capillary: 152 mg/dL — ABNORMAL HIGH (ref 70–99)
Glucose-Capillary: 139 mg/dL — ABNORMAL HIGH (ref 70–99)
Glucose-Capillary: 143 mg/dL — ABNORMAL HIGH (ref 70–99)

## 2013-01-09 SURGERY — INCISION AND DRAINAGE
Anesthesia: General | Site: Arm Upper | Laterality: Left | Wound class: Dirty or Infected

## 2013-01-09 MED ORDER — FENTANYL CITRATE 0.05 MG/ML IJ SOLN
INTRAMUSCULAR | Status: AC
  Filled 2013-01-09: qty 2

## 2013-01-09 MED ORDER — LACTATED RINGERS IV SOLN
INTRAVENOUS | Status: DC | PRN
  Administered 2013-01-09 (×2): via INTRAVENOUS

## 2013-01-09 MED ORDER — CEFAZOLIN SODIUM-DEXTROSE 2-3 GM-% IV SOLR
2.0000 g | Freq: Three times a day (TID) | INTRAVENOUS | Status: DC
  Administered 2013-01-09 – 2013-01-17 (×24): 2 g via INTRAVENOUS
  Filled 2013-01-09 (×27): qty 50

## 2013-01-09 MED ORDER — PHENYLEPHRINE HCL 10 MG/ML IJ SOLN
INTRAMUSCULAR | Status: DC | PRN
  Administered 2013-01-09 (×2): 80 ug via INTRAVENOUS
  Administered 2013-01-09 (×2): 40 ug via INTRAVENOUS
  Administered 2013-01-09 (×2): 80 ug via INTRAVENOUS

## 2013-01-09 MED ORDER — RIVAROXABAN 15 MG PO TABS
15.0000 mg | ORAL_TABLET | Freq: Two times a day (BID) | ORAL | Status: DC
  Administered 2013-01-09 – 2013-01-17 (×16): 15 mg via ORAL
  Filled 2013-01-09 (×19): qty 1

## 2013-01-09 MED ORDER — SODIUM CHLORIDE 0.9 % IR SOLN
Status: DC | PRN
  Administered 2013-01-09: 12:00:00

## 2013-01-09 MED ORDER — ONDANSETRON HCL 4 MG/2ML IJ SOLN
INTRAMUSCULAR | Status: DC | PRN
  Administered 2013-01-09: 4 mg via INTRAVENOUS

## 2013-01-09 MED ORDER — EPHEDRINE SULFATE 50 MG/ML IJ SOLN
INTRAMUSCULAR | Status: DC | PRN
  Administered 2013-01-09: 10 mg via INTRAVENOUS

## 2013-01-09 MED ORDER — FENTANYL CITRATE 0.05 MG/ML IJ SOLN
INTRAMUSCULAR | Status: DC | PRN
  Administered 2013-01-09: 50 ug via INTRAVENOUS
  Administered 2013-01-09 (×6): 25 ug via INTRAVENOUS
  Administered 2013-01-09: 50 ug via INTRAVENOUS

## 2013-01-09 MED ORDER — KETOROLAC TROMETHAMINE 30 MG/ML IJ SOLN: 15.0000 mg | Freq: Once | INTRAMUSCULAR | Status: DC | PRN

## 2013-01-09 MED ORDER — PROMETHAZINE HCL 25 MG/ML IJ SOLN: 6.2500 mg | INTRAMUSCULAR | Status: DC | PRN

## 2013-01-09 MED ORDER — POLYMYXIN B SULFATE 500000 UNITS IJ SOLR
INTRAMUSCULAR | Status: DC | PRN
  Administered 2013-01-09: 12:00:00

## 2013-01-09 MED ORDER — FENTANYL CITRATE 0.05 MG/ML IJ SOLN
25.0000 ug | INTRAMUSCULAR | Status: DC | PRN
  Administered 2013-01-09 (×2): 50 ug via INTRAVENOUS

## 2013-01-09 MED ORDER — RIVAROXABAN 20 MG PO TABS: 20.0000 mg | ORAL_TABLET | Freq: Every day | ORAL | Status: DC

## 2013-01-09 MED ORDER — FERROUS SULFATE 325 (65 FE) MG PO TABS
325.0000 mg | ORAL_TABLET | Freq: Three times a day (TID) | ORAL | Status: DC
  Administered 2013-01-09 – 2013-01-17 (×24): 325 mg via ORAL
  Filled 2013-01-09 (×26): qty 1

## 2013-01-09 MED ORDER — PROPOFOL 10 MG/ML IV BOLUS
INTRAVENOUS | Status: DC | PRN
  Administered 2013-01-09: 180 mg via INTRAVENOUS

## 2013-01-09 SURGICAL SUPPLY — 43 items
BAG SPEC THK2 15X12 ZIP CLS (MISCELLANEOUS)
BAG ZIPLOCK 12X15 (MISCELLANEOUS) ×1 IMPLANT
BANDAGE ELASTIC 6 VELCRO ST LF (GAUZE/BANDAGES/DRESSINGS) ×1 IMPLANT
BANDAGE ESMARK 6X9 LF (GAUZE/BANDAGES/DRESSINGS) ×2 IMPLANT
BANDAGE GAUZE ELAST BULKY 4 IN (GAUZE/BANDAGES/DRESSINGS) ×1 IMPLANT
BNDG CMPR 9X6 STRL LF SNTH (GAUZE/BANDAGES/DRESSINGS) ×2
BNDG ESMARK 6X9 LF (GAUZE/BANDAGES/DRESSINGS) ×3
CLOTH BEACON ORANGE TIMEOUT ST (SAFETY) ×3 IMPLANT
CUFF TOURN SGL QUICK 18 (TOURNIQUET CUFF) ×1 IMPLANT
CUFF TOURN SGL QUICK 24 (TOURNIQUET CUFF)
CUFF TOURN SGL QUICK 34 (TOURNIQUET CUFF)
CUFF TRNQT CYL 24X4X40X1 (TOURNIQUET CUFF) IMPLANT
CUFF TRNQT CYL 34X4X40X1 (TOURNIQUET CUFF) IMPLANT
DRAIN CHANNEL 10M FLAT 3/4 FLT (DRAIN) ×2 IMPLANT
DRAIN PENROSE 18X1/2 LTX STRL (DRAIN) ×2 IMPLANT
DRSG PAD ABDOMINAL 8X10 ST (GAUZE/BANDAGES/DRESSINGS) ×9 IMPLANT
DURAPREP 26ML APPLICATOR (WOUND CARE) ×2 IMPLANT
ELECT REM PT RETURN 9FT ADLT (ELECTROSURGICAL) ×3
ELECTRODE REM PT RTRN 9FT ADLT (ELECTROSURGICAL) ×2 IMPLANT
EVACUATOR 1/8 PVC DRAIN (DRAIN) ×2 IMPLANT
EVACUATOR SILICONE 100CC (DRAIN) ×2 IMPLANT
GAUZE XEROFORM 1X8 LF (GAUZE/BANDAGES/DRESSINGS) ×1 IMPLANT
GAUZE XEROFORM 5X9 LF (GAUZE/BANDAGES/DRESSINGS) ×1 IMPLANT
GLOVE ECLIPSE 8.5 STRL (GLOVE) ×8 IMPLANT
GOWN STRL REIN XL XLG (GOWN DISPOSABLE) ×3 IMPLANT
HANDPIECE INTERPULSE COAX TIP (DISPOSABLE)
KIT BASIN OR (CUSTOM PROCEDURE TRAY) ×3 IMPLANT
MANIFOLD NEPTUNE II (INSTRUMENTS) ×1 IMPLANT
PACK LOWER EXTREMITY WL (CUSTOM PROCEDURE TRAY) ×3 IMPLANT
PAD CAST 4YDX4 CTTN HI CHSV (CAST SUPPLIES) ×2 IMPLANT
PADDING CAST COTTON 4X4 STRL (CAST SUPPLIES) ×3
PADDING CAST COTTON 6X4 STRL (CAST SUPPLIES) ×1 IMPLANT
POSITIONER SURGICAL ARM (MISCELLANEOUS) ×2 IMPLANT
SET HNDPC FAN SPRY TIP SCT (DISPOSABLE) ×2 IMPLANT
SPLINT FIBERGLASS 5X30 (CAST SUPPLIES) ×1 IMPLANT
SPONGE GAUZE 4X4 12PLY (GAUZE/BANDAGES/DRESSINGS) ×3 IMPLANT
STAPLER VISISTAT 35W (STAPLE) ×2 IMPLANT
STOCKINETTE 8 INCH (MISCELLANEOUS) ×1 IMPLANT
SUT PROLENE 3 0 SH 48 (SUTURE) ×1 IMPLANT
SUT VIC AB 0 CT1 27 (SUTURE) ×9
SUT VIC AB 0 CT1 27XBRD ANTBC (SUTURE) IMPLANT
SYR CONTROL 10ML LL (SYRINGE) ×3 IMPLANT
TOWEL OR 17X26 10 PK STRL BLUE (TOWEL DISPOSABLE) ×6 IMPLANT

## 2013-01-09 NOTE — Consult Note (Signed)
[facility]  Total days of antibiotics 10        Day 10 vanco        Day 10 piptazo               Reason for Consult: MSSA myofascitis with septic olecranon bursitis   Referring Physician: Otelia Sergeant  Principal Problem:   Cellulitis Active Problems:   Hyponatremia   Hypokalemia   Fasting hyperglycemia   Leukocytosis   Hypotension   Diabetes mellitus type 2, noninsulin dependent   Hypertrophic cardiomyopathy   Atrial fibrillation   Acute blood loss anemia    HPI: [name] is a 67 y.o. female right hand dominant, DM presented to ED on 5/30 after having 1 week worth of left elbow pain and erythema. She was started on bactrim and keflex 2 days prior to admit without much improvement. On admit, she was found to have hypotensive,in afib, physical exam suggested LUE cellulitis, and labs revealed WBC of 24, left shift. She was started on broad spectrum antibiotics with vanco/piptazo, with blood cultures drawn. Blood cultures negative. Had some initial improvement with Iv antibiotics while cardiology subsequently consulted. Atrial flutter/fibrillation. She was transitioned to bactrim but WBc continue to climb. She eventually had MRI that showed infected olecranon bursitis and myofascitis.  She underwent I x D on 6/5, cultures from OR showed MSSA. Followed by a  2nd I x D on 6/8, where Dr. Otelia Sergeant was able to close wound with JP drains in place, who felt infection spared the joint space but likely had reactive effusion.  Past Medical History  Diagnosis Date  . Diabetes mellitus without complication     dx on wed  . Mitral valve prolapse   . Atrial flutter     Allergies: No Known Allergies   MEDICATIONS: . aspirin EC  81 mg Oral Daily  .  ceFAZolin (ANCEF) IV  2 g Intravenous Q8H  . fentaNYL      . insulin aspart  0-5 Units Subcutaneous QHS  . insulin aspart  0-9 Units Subcutaneous TID WC  . metoprolol tartrate  25 mg Oral BID  . polyethylene glycol   17 g Oral BID  . rivaroxaban  15 mg Oral BID WC   Followed by  . [START ON 01/28/2013] rivaroxaban  20 mg Oral Q supper  . sodium chloride  3 mL Intravenous Q12H    History  Substance Use Topics  . Smoking status: Former Smoker -- 4 years    Quit date: 12/31/1980  . Smokeless tobacco: Never Used  . Alcohol Use: No    Family History  Problem Relation Age of Onset  . Cancer Mother   . Diabetes Father      Review of Systems  Constitutional: Negative for fever, chills, diaphoresis, activity change, appetite change, fatigue and unexpected weight change.  HENT: Negative for congestion, sore throat, rhinorrhea, sneezing, trouble swallowing and sinus pressure.  Eyes: Negative for photophobia and visual disturbance.  Respiratory: Negative for cough, chest tightness, shortness of breath, wheezing and stridor.  Cardiovascular: Negative for chest pain, palpitations and leg swelling.  Gastrointestinal: Negative for nausea, vomiting, abdominal pain, diarrhea, constipation, blood in stool, abdominal distention and anal bleeding.  Genitourinary: Negative for dysuria, hematuria, flank pain and difficulty urinating.  Musculoskeletal: positive for pain from surgery to left elbow Skin: Negative for color change, pallor, rash and wound.  Neurological: Negative for dizziness, tremors, weakness and light-headedness.  Hematological: Negative for adenopathy. Does not  bruise/bleed easily.  Psychiatric/Behavioral: Negative for behavioral problems, confusion, sleep disturbance, dysphoric mood, decreased concentration and agitation.     OBJECTIVE: Temp:  [97.7 F (36.5 C)-98.6 F (37 C)] 98.1 F (36.7 C) (06/08 1345) Pulse Rate:  [73-130] 78 (06/08 1411) Resp:  [11-23] 15 (06/08 1411) BP: (95-131)/(42-66) 124/60 mmHg (06/08 1411) SpO2:  [95 %-99 %] 95 % (06/08 1411)  Physical Exam  Constitutional: oriented to person, place, and time. appears well-developed and well-nourished. No distress.  HENT:    Mouth/Throat: Oropharynx is clear and moist. No oropharyngeal exudate.  Cardiovascular: Normal rate, regular rhythm and normal heart sounds. Exam reveals no gallop and no friction rub.  No murmur heard.  Pulmonary/Chest: Effort normal and breath sounds normal. No respiratory distress.  no wheezes.  Abdominal: Soft. Bowel sounds are normal.  exhibits no distension. There is no tenderness.  Lymphadenopathy: no cervical adenopathy.  Neurological: alert and oriented to person, place, and time.  Skin: Skin is warm and dry. No rash noted. No erythema.  Psychiatric:  a normal mood and affect.  behavior is normal.  Ext: left arm complete wrapped from today's surgery, 2 jp bulbs with serosanguinous drainage  LABS: Results for orders placed during the hospital encounter of 12/31/12 (from the past 48 hour(s))  GLUCOSE, CAPILLARY     Status: Abnormal   Collection Time    01/07/13  5:02 PM      Result Value Range   Glucose-Capillary 118 (*) 70 - 99 mg/dL  HEMOGLOBIN AND HEMATOCRIT, BLOOD     Status: Abnormal   Collection Time    01/07/13  8:05 PM      Result Value Range   Hemoglobin 7.3 (*) 12.0 - 15.0 g/dL   Comment: REPEATED TO VERIFY   HCT 21.5 (*) 36.0 - 46.0 %  TYPE AND SCREEN     Status: None   Collection Time    01/07/13  9:30 PM      Result Value Range   ABO/RH(D) A NEG     Antibody Screen NEG     Sample Expiration 01/10/2013     Unit Number [id number]     Blood Component Type RED CELLS,LR     Unit division 00     Status of Unit ISSUED,FINAL     Transfusion Status OK TO TRANSFUSE     Crossmatch Result Compatible     Unit Number [id number]     Blood Component Type RED CELLS,LR     Unit division 00     Status of Unit ISSUED     Transfusion Status OK TO TRANSFUSE     Crossmatch Result Compatible     Unit Number [id number]     Blood Component Type RED CELLS,LR     Unit division 00     Status of Unit ISSUED     Transfusion Status OK TO TRANSFUSE     Crossmatch Result  Compatible    PREPARE RBC (CROSSMATCH)     Status: None   Collection Time    01/07/13  9:30 PM      Result Value Range   Order Confirmation ORDER PROCESSED BY BLOOD BANK    ABO/RH     Status: None   Collection Time    01/07/13  9:30 PM      Result Value Range   ABO/RH(D) A NEG    GLUCOSE, CAPILLARY     Status: Abnormal   Collection Time    01/07/13 10:45 PM      Result  Value Range   Glucose-Capillary 141 (*) 70 - 99 mg/dL   Comment 1 Documented in Chart     Comment 2 Notify RN    CBC     Status: Abnormal   Collection Time    01/08/13  6:30 AM      Result Value Range   WBC 23.4 (*) 4.0 - 10.5 K/uL   RBC 2.52 (*) 3.87 - 5.11 MIL/uL   Hemoglobin 7.5 (*) 12.0 - 15.0 g/dL   HCT 16.1 (*) 09.6 - 04.5 %   MCV 87.7  78.0 - 100.0 fL   MCH 29.8  26.0 - 34.0 pg   MCHC 33.9  30.0 - 36.0 g/dL   RDW 40.9  81.1 - 91.4 %   Platelets 323  150 - 400 K/uL  BASIC METABOLIC PANEL     Status: Abnormal   Collection Time    01/08/13  6:30 AM      Result Value Range   Sodium 129 (*) 135 - 145 mEq/L   Comment: DELTA CHECK NOTED     REPEATED TO VERIFY   Potassium 4.3  3.5 - 5.1 mEq/L   Chloride 98  96 - 112 mEq/L   CO2 24  19 - 32 mEq/L   Glucose, Bld 140 (*) 70 - 99 mg/dL   BUN 10  6 - 23 mg/dL   Creatinine, Ser 7.82  0.50 - 1.10 mg/dL   Calcium 7.6 (*) 8.4 - 10.5 mg/dL   GFR calc non Af Amer >90  >90 mL/min   GFR calc Af Amer >90  >90 mL/min   Comment:            The eGFR has been calculated     using the CKD EPI equation.     This calculation has not been     validated in all clinical     situations.     eGFR's persistently     <90 mL/min signify     possible Chronic Kidney Disease.  GLUCOSE, CAPILLARY     Status: Abnormal   Collection Time    01/08/13  7:28 AM      Result Value Range   Glucose-Capillary 124 (*) 70 - 99 mg/dL   Comment 1 Documented in Chart     Comment 2 Notify RN    PREPARE RBC (CROSSMATCH)     Status: None   Collection Time    01/08/13  9:36 AM      Result  Value Range   Order Confirmation ORDER PROCESSED BY BLOOD BANK    PROTIME-INR     Status: Abnormal   Collection Time    01/08/13 10:10 AM      Result Value Range   Prothrombin Time 16.3 (*) 11.6 - 15.2 seconds   INR 1.34  0.00 - 1.49  GLUCOSE, CAPILLARY     Status: Abnormal   Collection Time    01/08/13 11:31 AM      Result Value Range   Glucose-Capillary 156 (*) 70 - 99 mg/dL  GLUCOSE, CAPILLARY     Status: Abnormal   Collection Time    01/08/13  4:05 PM      Result Value Range   Glucose-Capillary 180 (*) 70 - 99 mg/dL   Comment 1 Documented in Chart     Comment 2 Notify RN    GLUCOSE, CAPILLARY     Status: Abnormal   Collection Time    01/08/13 10:15 PM      Result Value Range  Glucose-Capillary 191 (*) 70 - 99 mg/dL   Comment 1 Documented in Chart     Comment 2 Notify RN    CBC WITH DIFFERENTIAL     Status: Abnormal   Collection Time    01/09/13  3:42 AM      Result Value Range   WBC 19.8 (*) 4.0 - 10.5 K/uL   RBC 3.36 (*) 3.87 - 5.11 MIL/uL   Hemoglobin 10.1 (*) 12.0 - 15.0 g/dL   Comment: DELTA CHECK NOTED     REPEATED TO VERIFY     POST TRANSFUSION SPECIMEN   HCT 29.5 (*) 36.0 - 46.0 %   MCV 87.8  78.0 - 100.0 fL   MCH 30.1  26.0 - 34.0 pg   MCHC 34.2  30.0 - 36.0 g/dL   RDW 16.1  09.6 - 04.5 %   Platelets 386  150 - 400 K/uL   Neutrophils Relative % 82 (*) 43 - 77 %   Neutro Abs 16.1 (*) 1.7 - 7.7 K/uL   Lymphocytes Relative 11 (*) 12 - 46 %   Lymphs Abs 2.2  0.7 - 4.0 K/uL   Monocytes Relative 6  3 - 12 %   Monocytes Absolute 1.1 (*) 0.1 - 1.0 K/uL   Eosinophils Relative 1  0 - 5 %   Eosinophils Absolute 0.3  0.0 - 0.7 K/uL   Basophils Relative 0  0 - 1 %   Basophils Absolute 0.1  0.0 - 0.1 K/uL  BASIC METABOLIC PANEL     Status: Abnormal   Collection Time    01/09/13  3:42 AM      Result Value Range   Sodium 128 (*) 135 - 145 mEq/L   Potassium 4.2  3.5 - 5.1 mEq/L   Chloride 97  96 - 112 mEq/L   CO2 25  19 - 32 mEq/L   Glucose, Bld 198 (*) 70 -  99 mg/dL   BUN 12  6 - 23 mg/dL   Creatinine, Ser 4.09  0.50 - 1.10 mg/dL   Calcium 7.7 (*) 8.4 - 10.5 mg/dL   GFR calc non Af Amer 87 (*) >90 mL/min   GFR calc Af Amer >90  >90 mL/min   Comment:            The eGFR has been calculated     using the CKD EPI equation.     This calculation has not been     validated in all clinical     situations.     eGFR's persistently     <90 mL/min signify     possible Chronic Kidney Disease.  APTT     Status: None   Collection Time    01/09/13  3:42 AM      Result Value Range   aPTT 34  24 - 37 seconds  GLUCOSE, CAPILLARY     Status: Abnormal   Collection Time    01/09/13  1:44 PM      Result Value Range   Glucose-Capillary 139 (*) 70 - 99 mg/dL   Comment 1 Documented in Chart      MICRO: 5/30 blood cx x 2 NGTD 6/5 wound cx MSSA ( S oxa S bactrim S tetra S clinda)  IMAGING: Dg Abd 1 View  01/08/2013   *RADIOLOGY REPORT*  Clinical Data: Constipation for the past week.  ABDOMEN - 1 VIEW  Comparison: No priors.  Findings: Gas and stool are seen scattered throughout the colon extending to the level  of the distal rectum. Moderate volume of stool.  No pathologic distension of small bowel is noted.  No gross evidence of pneumoperitoneum.  IMPRESSION: 1.  Nonobstructive bowel gas pattern. 2.  No pneumoperitoneum. 3.  Moderate volume of stool compatible with reported history of constipation.   Original Report Authenticated By: Trudie Reed, M.D.    Assessment/Plan:  67yo F with LUE cellulitis due to infected olecranon bursistis with myofascitis s/p I x D x2  On broad spectrum antibiotics.  - will discontinue vanco and piptazo and narrow regimen to cefazolin 2gm IV Q8hr,  - length of therapy will likely be 4 wk, using 6/6 as day #1 of antibiotics, - will check sed rate and crp - dr. Zenaida Niece dam to provide final recs in the morning  -will arrange for follow up in ID clinic/ [facility] in 3-4 wk   Clarnce Homan B. Drue Second MD MPH [facility] [phone]

## 2013-01-09 NOTE — H&P (View-Only) (Signed)
Patient ID: [name], female   DOB: 1945-08-07, 67 y.o.   MRN: [id number]  Subjective: 3 Days Post-Op Procedure(s) (LRB): IRRIGATION AND DEBRIDEMENT LEFT ELBOW AND LEFT FOREARM  (Left) APPLICATION OF WOUND VAC X 2 (Left)  Patient reports pain as moderate.    Objective:   VITALS:  Temp:  [97.8 F (36.6 C)-98.6 F (37 C)] 98.1 F (36.7 C) (06/08 0800) Pulse Rate:  [74-130] 81 (06/08 0800) Resp:  [15-23] 22 (06/08 0800) BP: (90-140)/(40-62) 108/48 mmHg (06/08 0800) SpO2:  [94 %-98 %] 97 % (06/08 0800)  Neurologically intact Sensation intact distally Intact pulses distally Incision: dressing C/D/I Compartment soft   LABS  Recent Labs  01/07/13 0339 01/07/13 2005 01/08/13 0630 01/09/13 0342  HGB 8.5* 7.3* 7.5* 10.1*  WBC 29.1*  --  23.4* 19.8*  PLT 314  --  323 386    Recent Labs  01/08/13 0630 01/09/13 0342  NA 129* 128*  K 4.3 4.2  CL 98 97  CO2 24 25  BUN 10 12  CREATININE 0.64 0.75  GLUCOSE 140* 198*    Recent Labs  01/08/13 1010  INR 1.34     Assessment/Plan: 3 Days Post-Op Procedure(s) (LRB): IRRIGATION AND DEBRIDEMENT LEFT ELBOW AND LEFT FOREARM  (Left) APPLICATION OF WOUND VAC X 2 (Left) Anemia of acute blood loss. 3 total units PRBCs. Hgb 10 Afibrillation, restart anticoagulants post op  Continue ABX therapy due to Culture taken of surgical site during joint revision OR today scheduled for 11 AM now.   NITKA,JAMES E 01/09/2013, 9:42 AM

## 2013-01-09 NOTE — Op Note (Addendum)
12/31/2012 - 01/09/2013  1:31 PM  PATIENT:  [name]  67 y.o. female  MRN: [id number]  OPERATIVE REPORT  PRE-OPERATIVE DIAGNOSIS:  left arn   POST-OPERATIVE DIAGNOSIS:  infected left arm  PROCEDURE:  Procedure(s): REDO INCISION AND DRAINAGE LEFT ELBOW SECONDARY CLOSURE OF WOUND  LEFT ELBOW    SURGEON:  Kerrin Champagne, MD      ANESTHESIA:  General, Dr. Okey Dupre    COMPLICATIONS:  None.     DRAINS: JP x  2 left arm.   PROCEDURE:The patient was met in the holding area, and the appropriate Left arm identified and marked with "x" and my initials.The patient was then transported to OR and was placed under general anesthesia without difficulty. The patient received appropriate preoperative antibiotic prophylaxis. The patient after intubation atraumatically was transferred to the operating room table, supine position, with a regular arm board along left side of table. All pressure points were well padded. Standard prep with Betadine scrub and prep left wrist to the left axilla. Draped in the usual manner leaving the wrist and hand with water impervious stockinette. Sutures that had been used to reapproximate  the volar incision site were removed as were the posterior medial flap incision sutures. First the volar wound was explored and found to be approximately 97% granulating very small areas of debris which were easily debrided with Metzenbaum and pickups. A flap along the volar medial aspect of the incision site extended towards the posterior medial elbow area and communicated with the posterior incision. Here hematoma was removed and the area irrigated with copious amounts of double antibiotic solution. Attention then turned to the posterior incision site that extended from the proximal forearm along the midline at the olecranon process and then the in the midline along the triceps all the way to the most proximal triceps. Here the sutures used to tag the incision edges were carefully  removed. Curette as well as scalpel was used to remove devitalized tissue and hematoma from the incision from distal to proximal. There was some undermining of the skin and subcutaneous layers proximally and a scalpel was used to extend the posterior incision to allow for adequate exposure here and debridement of areas of devitalized tissue that represented small amount of fascia and necrotic subcutaneous fat. This was debrided to normal looking tissue. Irrigation was then carried out with copious amounts of double antibiotic solution. There were no areas of pockets of purulence noted but a small amount of fascia which required debridement along the posterior lateral aspect of the proximal incision sites overlying the lateral aspect of the posterior triceps. This however following debridement showed very normal appearance. Therefore the incision was felt to be in good enough condition to allow for a primary closure. After irrigating with copious amounts of double antibiotic solution a total of 500 cc used for the posterior incision site and 250 cc of double antibiotic solution the anterior incision site a stab incision was made over the ulna side of the proximal forearm distal to the incision site and a tonsil clamp used to deliver a flat Jackson-Pratt to the ulnar posterior proximal forearm distal to the posterior incision. This was sewn in place with a 3-0 Prolene suture and placed along the ulnar posterior aspect of the arm extending from distal to proximal the entire length of the incision. Subcutaneous layers were then approximated with interrupted 0 Vicryl sutures and the skin closed with stainless steel staples. The Jackson-Pratt was charged showed good suction and good  removal of subcutaneous fluid. Returning to the volar proximal forearm incision site in the area of the antecubital extending distally the incision was carefully irrigated again and explored showed remaining hematoma and debris so that a stab  incision was made along the volar aspect of the forearm along the ulnar aspect of the incision distally and a tonsil clamp used to deliver 2 portion of the flat Jackson-Pratt used to drain the volar incision this was then passed along the medial volar incision site that was communicating with the posterior incision. This was sewn in place with the 3-0 Prolene suture. Subcutaneous layers of the volar incision were then approximated with interrupted 0 Vicryl sutures and the skin closed with stainless steel staples. This Jackson-Pratt when charge showed excellent suction and drainage of the volar incision site. Xeroform was then applied to the staple incision lines the anterior and posterior 4 x 4's ABDs pads fixed to the skin with sterile Webril and a well-padded posterior long-arm splint was applied from the left wrist the left upper arm using synthetic fiberglass 5 x 30 prefabricated splint material excess removed. All instrument and sponge counts were correct patient was then reactivated extubated and returned to the recovery room in satisfactory condition.  Postoperative care: The patient may be mobilized from bed recliner and walking with assistance using a sling for the left upper extremity sitting upright or out of bed. The Jackson-Pratt drains should be emptied every shift and drainage recorded for each drain separately. We'll plan to remove drains when there is almost no drainage present. Patient was on anticoagulant therapy for atrial fibrillation and DVT left upper extremity and this will be restarted at 8 PM after a period of nearly 8 hours from the time of surgery to allow for adequate hemostasis of the left arm. We'll plan to mobilize the patient and have her seen by both physical therapy and occupational therapy. We'll need to adjust her medicines to account for a methicillin sensitive staph aureus bacteria is seen in her cultures from her initial surgery.   NITKA,JAMES E  01/09/2013, 1:31 PM

## 2013-01-09 NOTE — Brief Op Note (Addendum)
12/31/2012 - 01/09/2013  12:56 PM  PATIENT:  [name]  67 y.o. female  PRE-OPERATIVE DIAGNOSIS:  left arn   POST-OPERATIVE DIAGNOSIS:  infected left arm  PROCEDURE:Procedure(s) with comments: REDO INCISION AND DRAINAGE LEFT ELBOW (Left) - SUPINE, UPPER EXTERMITY DRAPE SECONDARY CLOSURE OF WOUND  LEFT ELBOW (Left)    SURGEON:  Surgeon(s) and Role: Kerrin Champagne, MD - Primary   ANESTHESIA:   general, Dr. Okey Dupre   EBL:  Total I/O In: 1875 [I.V.:1600; IV Piggyback:275] Out: 750 [Urine:550; Blood:200]  BLOOD ADMINISTERED:none  DRAINS: (2 ) Jackson-Pratt drain(s) with closed bulb suction in the left arm one posterior along posteromedial elbow to upper arm and one volar proximal forearm antecubital both sewn in place.   EBL: 150cc  LOCAL MEDICATIONS USED:  NONE  SPECIMEN:  No Specimen  DISPOSITION OF SPECIMEN:  N/A  COUNTS:  YES  TOURNIQUET:  None used.  DICTATION: Scientist, research (medical) Dictation  PLAN OF CARE: Admit to inpatient   PATIENT DISPOSITION:  PACU - hemodynamically stable.   Delay start of Pharmacological VTE agent (>24hrs) due to surgical blood loss or risk of bleeding: no

## 2013-01-09 NOTE — Transfer of Care (Signed)
Immediate Anesthesia Transfer of Care Note  Patient: [name]  Procedure(s) Performed: Procedure(s) with comments: REDO INCISION AND DRAINAGE LEFT ELBOW (Left) - SUPINE, UPPER EXTERMITY DRAPE SECONDARY CLOSURE OF WOUND  LEFT ELBOW (Left) APPLICATION OF WOUND VAC (Left)  Patient Location: PACU  Anesthesia Type:General  Level of Consciousness: awake and patient cooperative  Airway & Oxygen Therapy: Patient Spontanous Breathing and Patient connected to face mask oxygen  Post-op Assessment: Report given to PACU RN and Post -op Vital signs reviewed and stable  Post vital signs: Reviewed and stable  Complications: No apparent anesthesia complications

## 2013-01-09 NOTE — Interval H&P Note (Signed)
History and Physical Interval Note:  01/09/2013 10:45 AM  [name]  has presented today for surgery, with the diagnosis of left arn   The various methods of treatment have been discussed with the patient and family. After consideration of risks, benefits and other options for treatment, the patient has consented to  Procedure(s) with comments: REDO INCISION AND DRAINAGE LEFT ELBOW (Left) - SUPINE, UPPER EXTERMITY DRAPE, TOURNQUET SECONDARY CLOSURE OF WOUND  LEFT ELBOW (Left) APPLICATION OF WOUND VAC (Left) as a surgical intervention .  The patient's history has been reviewed, patient examined, no change in status, stable for surgery.  I have reviewed the patient's chart and labs.  Questions were answered to the patient's satisfaction.     Tova Vater E

## 2013-01-09 NOTE — Anesthesia Preprocedure Evaluation (Addendum)
Anesthesia Evaluation  Patient identified by MRN, date of birth, ID band Patient awake    Reviewed: Allergy & Precautions, H&P , NPO status , Patient's Chart, lab work & pertinent test results  Airway Mallampati: II TM Distance: >3 FB Neck ROM: Full    Dental  (+) Poor Dentition, Missing and Chipped   Pulmonary neg pulmonary ROS,  breath sounds clear to auscultation  Pulmonary exam normal       Cardiovascular + dysrhythmias Atrial Fibrillation Rhythm:Regular Rate:Normal     Neuro/Psych negative neurological ROS  negative psych ROS   GI/Hepatic negative GI ROS, Neg liver ROS,   Endo/Other  diabetes, Type 2, Insulin Dependent  Renal/GU negative Renal ROS  negative genitourinary   Musculoskeletal negative musculoskeletal ROS (+)   Abdominal   Peds negative pediatric ROS (+)  Hematology negative hematology ROS (+)   Anesthesia Other Findings   Reproductive/Obstetrics negative OB ROS                          Anesthesia Physical Anesthesia Plan  ASA: III and emergent  Anesthesia Plan: General   Post-op Pain Management:    Induction: Intravenous  Airway Management Planned: LMA and Oral ETT  Additional Equipment:   Intra-op Plan:   Post-operative Plan: Extubation in OR  Informed Consent: I have reviewed the patients History and Physical, chart, labs and discussed the procedure including the risks, benefits and alternatives for the proposed anesthesia with the patient or authorized representative who has indicated his/her understanding and acceptance.   Dental advisory given  Plan Discussed with: CRNA and Surgeon  Anesthesia Plan Comments:         Anesthesia Quick Evaluation

## 2013-01-09 NOTE — Progress Notes (Signed)
[facility] PROGRESS NOTE  [name] WJX:[id number] DOB: 1945/08/09 DOA: 12/31/2012 PCP: [name], MD  67 y/o CF with history of DM presenting with LUE cellulitis.  Initially hypotensive has improved after broad spectrum antibiotics and IVF's. Developed atrial flutter and cardiology subsequently consulted.  Atrial flutter/fibrillation controlled with B blocker currently. Patient transitioned from zosyn/vanc to bactrim. WBC slightly up this am.  Ultrasound of LUE to assess for abscesses or DVT has been ordered.  Assessment/Plan  Cellulitis/left olecranon bursitis - continue Vancomycin/Zosyn  - MRI left elbow showed infected olecranon bursitis and suspect myofasciitis - ortho consulted, s/p excisional debridement left elbow and forearm on 6/5 - Hypotensive after surgery thus was transferred in SDU. - redo surgery 6/8 - growing Staph aureus sensitive to clinda, levo, oxacillin, Bactrim. Left arm DVT- started Xarelto 6/4, on hold in the day of surgery, restarted, now again on hold.  - restart per ortho postop. Atrial flutter - more tachycardic last night. Given hypotension, will use Metop. On Xarelto. - cardiology following Hyponatremia - TSH borderline, normal free T4; cortisol appropriately elevated. Constipation- resolved with enema Hypokalemia - Resolved  Hypotension - in SDU, continue IVF. BP better DM II - continue SSI  ABLA - monitor and transfuse as needed.  Code Status: Full code  Family Communication: none Disposition Plan: home when medically stable.   Consultants:  Cardiology Orthopedic surgery  Procedures:  None  Antibiotics:  Vancomycin and Zosyn 5/30 to 6/2 Bactrim 6/02 - 6/4 Clindamycin 6/4 >> 6/6 Vancomycin 6/6 >>  Zosyn 6/6 >>  HPI/Subjective: - mood better this morning, feels better overall. Still pain left elbow  Objective: Filed Vitals:   01/09/13 0200 01/09/13 0400 01/09/13 0405 01/09/13 0600  BP: 100/44  124/57 118/60  Pulse:  79  74 82  Temp:  98.5 F (36.9 C)    TempSrc:  Oral    Resp: 20  19 20   Height:      Weight:      SpO2: 96%  97% 97%    Intake/Output Summary (Last 24 hours) at 01/09/13 0741 Last data filed at 01/09/13 0700  Gross per 24 hour  Intake 3711.5 ml  Output   2410 ml  Net 1301.5 ml   Filed Weights   01/01/13 1900 01/07/13 0015 01/08/13 0400  Weight: 79.6 kg (175 lb 7.8 oz) 81.3 kg (179 lb 3.7 oz) 84.8 kg (186 lb 15.2 oz)   Exam:  General:  Pt in NAD, Alert and awake  Cardiovascular: RRR, no MRG  Respiratory: CTA BL, no wheezes  Abdomen: soft, NT, ND  Musculoskeletal: no cyanosis or clubbing; left elbow dressing in place  Data Reviewed: Basic Metabolic Panel:  Recent Labs Lab 01/05/13 0510 01/06/13 0545 01/07/13 0339 01/08/13 0630 01/09/13 0342  NA 123* 125* 122* 129* 128*  K 4.1 4.5 4.2 4.3 4.2  CL 90* 92* 91* 98 97  CO2 22 23 23 24 25   GLUCOSE 206* 211* 172* 140* 198*  BUN 24* 20 17 10 12   CREATININE 0.73 0.63 0.67 0.64 0.75  CALCIUM 8.5 8.4 8.0* 7.6* 7.7*   CBC:  Recent Labs Lab 01/03/13 0856 01/06/13 0545 01/07/13 0339 01/07/13 2005 01/08/13 0630 01/09/13 0342  WBC 20.4* 26.7* 29.1*  --  23.4* 19.8*  NEUTROABS  --   --   --   --   --  16.1*  HGB 11.0* 10.2* 8.5* 7.3* 7.5* 10.1*  HCT 32.6* 31.3* 25.5* 21.5* 22.1* 29.5*  MCV 86.9 86.0 85.9  --  87.7 87.8  PLT 404* 372 314  --  323 386   BNP (last 3 results)  Recent Labs  01/02/13 0354  PROBNP 6103.0*   CBG:  Recent Labs Lab 01/07/13 2245 01/08/13 0728 01/08/13 1131 01/08/13 1605 01/08/13 2215  GLUCAP 141* 124* 156* 180* 191*    Recent Results (from the past 240 hour(s))  CULTURE, BLOOD (ROUTINE X 2)     Status: None   Collection Time    12/31/12  6:45 PM      Result Value Range Status   Specimen Description BLOOD RIGHT ARM   Final   Special Requests BOTTLES DRAWN AEROBIC AND ANAEROBIC   Final   Culture  Setup Time 12/31/2012 23:44   Final   Culture NO GROWTH 5 DAYS    Final   Report Status 01/06/2013 FINAL   Final  CULTURE, BLOOD (ROUTINE X 2)     Status: None   Collection Time    12/31/12  6:51 PM      Result Value Range Status   Specimen Description BLOOD RIGHT HAND   Final   Special Requests BOTTLES DRAWN AEROBIC AND ANAEROBIC   Final   Culture  Setup Time 12/31/2012 23:43   Final   Culture NO GROWTH 5 DAYS   Final   Report Status 01/06/2013 FINAL   Final  MRSA PCR SCREENING     Status: None   Collection Time    01/01/13  7:21 PM      Result Value Range Status   MRSA by PCR NEGATIVE  NEGATIVE Final   Comment:            The GeneXpert MRSA Assay (FDA     approved for NASAL specimens     only), is one component of a     comprehensive MRSA colonization     surveillance program. It is not     intended to diagnose MRSA     infection nor to guide or     monitor treatment for     MRSA infections.  SURGICAL PCR SCREEN     Status: None   Collection Time    01/06/13  5:56 PM      Result Value Range Status   MRSA, PCR NEGATIVE  NEGATIVE Final   Staphylococcus aureus NEGATIVE  NEGATIVE Final   Comment:            The Xpert SA Assay (FDA     approved for NASAL specimens     in patients over 80 years of age),     is one component of     a comprehensive surveillance     program.  Test performance has     been validated by [employer] for patients greater     than or equal to 1 year old.     It is not intended     to diagnose infection nor to     guide or monitor treatment.  ANAEROBIC CULTURE     Status: None   Collection Time    01/06/13  7:58 PM      Result Value Range Status   Specimen Description ARM LEFT   Final   Special Requests NONE   Final   Gram Stain     Final   Value: MODERATE WBC PRESENT,BOTH PMN AND MONONUCLEAR     NO SQUAMOUS EPITHELIAL CELLS SEEN     NO ORGANISMS SEEN   Culture     Final  Value: NO ANAEROBES ISOLATED; CULTURE IN PROGRESS FOR 5 DAYS   Report Status PENDING   Incomplete  WOUND CULTURE      Status: None   Collection Time    01/06/13  7:58 PM      Result Value Range Status   Specimen Description ARM LEFT   Final   Special Requests NONE   Final   Gram Stain     Final   Value: RARE WBC PRESENT, PREDOMINANTLY PMN     NO SQUAMOUS EPITHELIAL CELLS SEEN     NO ORGANISMS SEEN   Culture NO GROWTH 2 DAYS   Final   Report Status 01/09/2013 FINAL   Final  WOUND CULTURE     Status: None   Collection Time    01/06/13  8:21 PM      Result Value Range Status   Specimen Description ARM FOREARM LEFT   Final   Special Requests NONE   Final   Gram Stain     Final   Value: FEW WBC PRESENT,BOTH PMN AND MONONUCLEAR     NO SQUAMOUS EPITHELIAL CELLS SEEN     RARE GRAM POSITIVE COCCI     IN PAIRS   Culture     Final   Value: FEW STAPHYLOCOCCUS AUREUS     Note: RIFAMPIN AND GENTAMICIN SHOULD NOT BE USED AS SINGLE DRUGS FOR TREATMENT OF STAPH INFECTIONS.   Report Status PENDING   Incomplete  ANAEROBIC CULTURE     Status: None   Collection Time    01/06/13  8:21 PM      Result Value Range Status   Specimen Description ARM FOREARM LEFT   Final   Special Requests NONE   Final   Gram Stain     Final   Value: RARE WBC PRESENT, PREDOMINANTLY PMN     NO SQUAMOUS EPITHELIAL CELLS SEEN     NO ORGANISMS SEEN   Culture     Final   Value: NO ANAEROBES ISOLATED; CULTURE IN PROGRESS FOR 5 DAYS   Report Status PENDING   Incomplete     Studies: Dg Abd 1 View  01/08/2013   *RADIOLOGY REPORT*  Clinical Data: Constipation for the past week.  ABDOMEN - 1 VIEW  Comparison: No priors.  Findings: Gas and stool are seen scattered throughout the colon extending to the level of the distal rectum. Moderate volume of stool.  No pathologic distension of small bowel is noted.  No gross evidence of pneumoperitoneum.  IMPRESSION: 1.  Nonobstructive bowel gas pattern. 2.  No pneumoperitoneum. 3.  Moderate volume of stool compatible with reported history of constipation.   Original Report Authenticated By: Trudie Reed, M.D.    Scheduled Meds: . aspirin EC  81 mg Oral Daily  . insulin aspart  0-5 Units Subcutaneous QHS  . insulin aspart  0-9 Units Subcutaneous TID WC  . metoprolol tartrate  25 mg Oral BID  . piperacillin-tazobactam (ZOSYN)  IV  3.375 g Intravenous Q8H  . polyethylene glycol  17 g Oral BID  . rivaroxaban  15 mg Oral BID WC   Followed by  . [START ON 01/28/2013] rivaroxaban  20 mg Oral Q supper  . sodium chloride  3 mL Intravenous Q12H  . vancomycin  1,000 mg Intravenous Q12H   Continuous Infusions: . 0.9 % NaCl with KCl 20 mEq / L 75 mL/hr at 01/09/13 0730    Principal Problem:   Cellulitis Active Problems:   Hyponatremia   Hypokalemia   Fasting  hyperglycemia   Leukocytosis   Hypotension   Diabetes mellitus type 2, noninsulin dependent   Hypertrophic cardiomyopathy   Atrial fibrillation   Acute blood loss anemia  Time spent:  35 minutes  Pamella Pert  [facility] Pager [phone] If 7PM-7AM, please contact night-coverage at [email or web address], password [facility] 01/09/2013, 7:41 AM  LOS: 9 days

## 2013-01-09 NOTE — Progress Notes (Signed)
OT Cancellation Note  Patient Details Name: LASHEBA STEVENS MRN: [id number] DOB: [birth year]   Cancelled Treatment:    Reason Eval/Treat Not Completed: Patient at procedure or test/ unavailable--in OR.  Evette Georges [phone] 01/09/2013, 11:58 AM

## 2013-01-09 NOTE — Preoperative (Signed)
Beta Blockers   Reason not to administer Beta Blockers2:Hold beta blocker due to other, NPO for OR. Was given last pm

## 2013-01-09 NOTE — Progress Notes (Signed)
Patient ID: [name], female   DOB: 12/24/1945, 67 y.o.   MRN: [id number]  Subjective: 3 Days Post-Op Procedure(s) (LRB): IRRIGATION AND DEBRIDEMENT LEFT ELBOW AND LEFT FOREARM  (Left) APPLICATION OF WOUND VAC X 2 (Left)  Patient reports pain as moderate.    Objective:   VITALS:  Temp:  [97.8 F (36.6 C)-98.6 F (37 C)] 98.1 F (36.7 C) (06/08 0800) Pulse Rate:  [74-130] 81 (06/08 0800) Resp:  [15-23] 22 (06/08 0800) BP: (90-140)/(40-62) 108/48 mmHg (06/08 0800) SpO2:  [94 %-98 %] 97 % (06/08 0800)  Neurologically intact Sensation intact distally Intact pulses distally Incision: dressing C/D/I Compartment soft   LABS  Recent Labs  01/07/13 0339 01/07/13 2005 01/08/13 0630 01/09/13 0342  HGB 8.5* 7.3* 7.5* 10.1*  WBC 29.1*  --  23.4* 19.8*  PLT 314  --  323 386    Recent Labs  01/08/13 0630 01/09/13 0342  NA 129* 128*  K 4.3 4.2  CL 98 97  CO2 24 25  BUN 10 12  CREATININE 0.64 0.75  GLUCOSE 140* 198*    Recent Labs  01/08/13 1010  INR 1.34     Assessment/Plan: 3 Days Post-Op Procedure(s) (LRB): IRRIGATION AND DEBRIDEMENT LEFT ELBOW AND LEFT FOREARM  (Left) APPLICATION OF WOUND VAC X 2 (Left) Anemia of acute blood loss. 3 total units PRBCs. Hgb 10 Afibrillation, restart anticoagulants post op  Continue ABX therapy due to Culture taken of surgical site during joint revision OR today scheduled for 11 AM now.   [name] E 01/09/2013, 9:42 AM  

## 2013-01-09 NOTE — Progress Notes (Signed)
Subjective:  Had brief sinus tachycardia last night. Rhythm this am is NSR with occasional PACs.  Objective:  Vital Signs in the last 24 hours: BP 118/60  Pulse 82  Temp(Src) 98.5 F (36.9 C) (Oral)  Resp 20  Ht 5\' 4"  (1.626 m)  Wt 186 lb 15.2 oz (84.8 kg)  BMI 32.07 kg/m2  SpO2 97%  Physical Exam: Obese white female in no acute distress Lungs:  Reduced breath sounds Cardiac:  Regular rhythm, normal S1 and S2, no S3, grade 2/6 systolic murmur at LSE. Extremities:  Left arm bandaged  Intake/Output from previous day: 06/07 0701 - 06/08 0700 In: 3711.5 [P.O.:450; I.V.:2000; Blood:724; IV Piggyback:537.5] Out: 2410 [Urine:2410]  Weight Filed Weights   01/01/13 1900 01/07/13 0015 01/08/13 0400  Weight: 175 lb 7.8 oz (79.6 kg) 179 lb 3.7 oz (81.3 kg) 186 lb 15.2 oz (84.8 kg)    Lab Results: Basic Metabolic Panel:  Recent Labs  45/40/98 0630 01/09/13 0342  NA 129* 128*  K 4.3 4.2  CL 98 97  CO2 24 25  GLUCOSE 140* 198*  BUN 10 12  CREATININE 0.64 0.75    CBC:  Recent Labs  01/08/13 0630 01/09/13 0342  WBC 23.4* 19.8*  NEUTROABS  --  16.1*  HGB 7.5* 10.1*  HCT 22.1* 29.5*  MCV 87.7 87.8  PLT 323 386   BNP    Component Value Date/Time   PROBNP 6103.0* 01/02/2013 0354    Telemetry: Sinus rhythm with PACs  ECHO Hypertrophic cardiomyopathy with mild SAM of anterior leaflet of MV, Moderate to severe LA enlargement  Assessment/Plan:  1. IHSS.  Murmur softer since intravascular volume better after transfusion of packed cells. 2. 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 3. Atrial fibrillation no recurrence 4. Hyponatremia stable. 5. DVT of left arm.  For repeat surgery and debridement this am by Dr. Otelia Sergeant. Xarelto on hold.  Recommendations:  Okay for surgery today.   Cassell Clement MD  Cardiology  01/09/2013, 8:02 AM

## 2013-01-09 NOTE — Progress Notes (Signed)
ANTICOAGULATION CONSULT NOTE - Follow-up Consult  Pharmacy Consult for Xarelto Indication: atrial fibrillation, DVT  No Known Allergies  Patient Measurements: Height: 5\' 4"  (162.6 cm) Weight: 186 lb 15.2 oz (84.8 kg) IBW/kg (Calculated) : 54.7  Vital Signs: Temp: 98.1 F (36.7 C) (06/08 1345) Temp src: Oral (06/08 0800) BP: 143/54 mmHg (06/08 1500) Pulse Rate: 79 (06/08 1500)  Labs:  Recent Labs  01/07/13 0339 01/07/13 2005 01/08/13 0630 01/08/13 1010 01/09/13 0342  HGB 8.5* 7.3* 7.5*  --  10.1*  HCT 25.5* 21.5* 22.1*  --  29.5*  PLT 314  --  323  --  386  APTT  --   --   --   --  34  LABPROT  --   --   --  16.3*  --   INR  --   --   --  1.34  --   CREATININE 0.67  --  0.64  --  0.75   Estimated Creatinine Clearance: Kayla.8 ml/min (by C-G formula based on Cr of 0.75).  Medical History: Past Medical History  Diagnosis Date  . Diabetes mellitus without complication     dx on wed  . Mitral valve prolapse   . Atrial flutter    Assessment: [name] with new onset atrial fibrillation and LUE DVT noted on venous Doppler.  Patient was started on Lovenox for afib on 6/2, switch to Xarelto due to DVT.    Renal function stable, CrCl~74 ml/min.    CBC: Hgb=10.1, plts WNL  Note: patient was on verapamil (currently on hold vs d/c'd) - due to P-glycoprotein and CYP-450 inhibition - expect increase effects of Xarelto, but OK to use with CrCl > 63ml/min   Patient's CrCl (using actual BW)~ 112ml.min  Patient was on meloxicam which can increase risk of bleeding, d/c 6/5  6/7 am bleeding noted, Xarelto held for planned I&D 6/8 with drain placement, wound closure, VAC administration.  Goal of Therapy:  Treatment of DVT Monitor platelets by anticoagulation protocol: Yes   Plan:   Xarelto 15 mg PO twice daily with meals x 21 days (6/6-6/26), Xarelto 20 mg once daily with evening meal to start on 6/27.   Resume Xarelto 15mg  po bid with meals today at 8pm  Verapamil  currently discontinued so no issues with drug interactions currently, found information that Xarelto was studied using Actual BW to calculate CrCl, thus her CrCl ~153ml/min. So, per mfg, Ok to use Xarelto even if verapamil resumed (although would still expect higher drug levels than not on Xarelto)  Continue to watch CBC for Hgb and platelet trends  Otho Bellows PharmD Pager [phone] 01/09/2013, 3:25 PM

## 2013-01-10 ENCOUNTER — Encounter (HOSPITAL_COMMUNITY): Payer: Self-pay | Admitting: Specialist

## 2013-01-10 DIAGNOSIS — A4901 Methicillin susceptible Staphylococcus aureus infection, unspecified site: Secondary | ICD-10-CM

## 2013-01-10 LAB — GLUCOSE, CAPILLARY
Glucose-Capillary: 171 mg/dL — ABNORMAL HIGH (ref 70–99)
Glucose-Capillary: 117 mg/dL — ABNORMAL HIGH (ref 70–99)
Glucose-Capillary: 121 mg/dL — ABNORMAL HIGH (ref 70–99)
Glucose-Capillary: 111 mg/dL — ABNORMAL HIGH (ref 70–99)
Glucose-Capillary: 127 mg/dL — ABNORMAL HIGH (ref 70–99)

## 2013-01-10 LAB — CBC
HCT: 28.6 % — ABNORMAL LOW (ref 36.0–46.0)
Hemoglobin: 9.4 g/dL — ABNORMAL LOW (ref 12.0–15.0)
MCH: 29.1 pg (ref 26.0–34.0)
MCHC: 32.9 g/dL (ref 30.0–36.0)
RDW: 14.1 % (ref 11.5–15.5)

## 2013-01-10 LAB — BASIC METABOLIC PANEL
BUN: 9 mg/dL (ref 6–23)
Calcium: 7.9 mg/dL — ABNORMAL LOW (ref 8.4–10.5)
Creatinine, Ser: 0.63 mg/dL (ref 0.50–1.10)
GFR calc Af Amer: >90 mL/min (ref >90)
GFR calc non Af Amer: >90 mL/min (ref >90)
Glucose, Bld: 136 mg/dL — ABNORMAL HIGH (ref 70–99)

## 2013-01-10 MED ORDER — SODIUM CHLORIDE 0.9 % IJ SOLN
10.0000 mL | INTRAMUSCULAR | Status: DC | PRN
  Administered 2013-01-11 – 2013-01-17 (×3): 10 mL

## 2013-01-10 MED ORDER — SODIUM CHLORIDE 0.9 % IJ SOLN
10.0000 mL | Freq: Two times a day (BID) | INTRAMUSCULAR | Status: DC
  Administered 2013-01-12 – 2013-01-16 (×4): 10 mL

## 2013-01-10 NOTE — Progress Notes (Signed)
Subjective:  Events of weekend reviewed. C/o intermittent SOB.  Had transfusions over weekend.   Objective:  Vital Signs in the last 24 hours: BP 121/52  Pulse 65  Temp(Src) 98.3 F (36.8 C) (Oral)  Resp 19  Ht 5\' 4"  (1.626 m)  Wt 84.8 kg (186 lb 15.2 oz)  BMI 32.07 kg/m2  SpO2 97%  Physical Exam: Obese white female in no acute distress Lungs:  Reduced breath sounds Cardiac:  Regular rhythm, normal S1 and S2, no S3, no murmur noted Extremities:  Left arm bandaged  Intake/Output from previous day: 06/08 0701 - 06/09 0700 In: 3780 [I.V.:3250; IV Piggyback:475] Out: 2677 [Urine:2450; Drains:27; Blood:200]  Weight Filed Weights   01/01/13 1900 01/07/13 0015 01/08/13 0400  Weight: 79.6 kg (175 lb 7.8 oz) 81.3 kg (179 lb 3.7 oz) 84.8 kg (186 lb 15.2 oz)    Lab Results: Basic Metabolic Panel:  Recent Labs  16/10/96 0342 01/10/13 0345  NA 128* 132*  K 4.2 4.5  CL 97 100  CO2 25 26  GLUCOSE 198* 136*  BUN 12 9  CREATININE 0.75 0.63    CBC:  Recent Labs  01/09/13 0342 01/10/13 0345  WBC 19.8* 16.0*  NEUTROABS 16.1*  --   HGB 10.1* 9.4*  HCT 29.5* 28.6*  MCV 87.8 88.5  PLT 386 409*   BNP    Component Value Date/Time   PROBNP 6103.0* 01/02/2013 0354    Telemetry: Sinus rhythm  ECHO Hypertrophic cardiomyopathy with mild SAM of anterior leaflet of MV, Moderate to severe LA enlargement  Assessment/Plan:  1. Hypotension during surgery could have been anesthesia or relative intracavitary obstruction from HCM if was volume depleted or vasodilated.  BP still labile2. 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 3. Atrial fibrillation no recurrence 4. Hyponatremia improved 5. DVT of left arm  Recommendations:  BP labile, but stable.  Hyponatremia improved.  Still has a ways to go.  OK to stay off of verapamil and stay on beta blockers for now.  Darden Palmer  MD [facility]  01/10/2013, 9:12 AM

## 2013-01-10 NOTE — Progress Notes (Signed)
[facility] PROGRESS NOTE  Kayla BRAZEAU ZOX:[id number] DOB: 02-06-1946 DOA: 12/31/2012 PCP: [name], MD  67 y/o CF with history of DM presenting with LUE cellulitis.  Initially hypotensive has improved after broad spectrum antibiotics and IVF's. Developed atrial flutter and cardiology subsequently consulted.  Atrial flutter/fibrillation controlled with B blocker currently. Patient transitioned from zosyn/vanc to bactrim. WBC slightly up this am.  Ultrasound of LUE to assess for abscesses or DVT has been ordered.  Assessment/Plan  Cellulitis/left olecranon bursitis  - MRI left elbow showed infected olecranon bursitis and suspect myofasciitis - ortho consulted, s/p excisional debridement left elbow and forearm on 6/5 and closure with drains 6/8 - growing MSSA, now on Cefazolin with plans for 4 weeks starting 6/6, to be done 02/04/2013.  - PICC line placement 6/9 - Hypotensive after surgery thus was transferred in SDU, transferred to floor on 6/9. Left arm DVT- on Xarelto 6/4 Atrial flutter - sinus rhythm this morning, no tachycardia noted Hyponatremia - TSH borderline, normal free T4; cortisol appropriately elevated. Constipation- resolved with enema Hypokalemia - Resolved  Hypotension - in SDU, continue IVF. BP better DM II - continue SSI  ABLA - monitor and transfuse as needed.  Code Status: Full code  Family Communication: [name] Disposition Plan: home when medically stable.   Consultants:  Cardiology Orthopedic surgery ID  Procedures:  [name]  Antibiotics:  Vancomycin and Zosyn 5/30 to 6/2 Bactrim 6/02 - 6/4 Clindamycin 6/4 >> 6/6 Vancomycin 6/6 >> 6/8 Zosyn 6/6 >> 6/8 Cefazolin 6/8 >> planned for 4 weeks until 02/04/2013  HPI/Subjective: - feels well this morning, a bit overwhelmed  Objective: Filed Vitals:   01/10/13 0005 01/10/13 0200 01/10/13 0400 01/10/13 0803  BP: 121/58 118/97 121/52   Pulse: 76 75 65   Temp:   98 F (36.7 C) 98.3 F (36.8 C)   TempSrc:   Oral Oral  Resp: 19 19 19    Height:      Weight:      SpO2: 97% 92% 97%     Intake/Output Summary (Last 24 hours) at 01/10/13 0810 Last data filed at 01/10/13 0700  Gross per 24 hour  Intake 3642.5 ml  Output   2477 ml  Net 1165.5 ml   Filed Weights   01/01/13 1900 01/07/13 0015 01/08/13 0400  Weight: 79.6 kg (175 lb 7.8 oz) 81.3 kg (179 lb 3.7 oz) 84.8 kg (186 lb 15.2 oz)   Exam:  General:  Pt in NAD, Alert and awake  Cardiovascular: RRR, no MRG  Respiratory: CTA BL, no wheezes  Abdomen: soft, NT, ND  Musculoskeletal: no cyanosis or clubbing; left elbow dressing in place, drains in place with serosanguinous drainage.   Data Reviewed: Basic Metabolic Panel:  Recent Labs Lab 01/06/13 0545 01/07/13 0339 01/08/13 0630 01/09/13 0342 01/10/13 0345  NA 125* 122* 129* 128* 132*  K 4.5 4.2 4.3 4.2 4.5  CL 92* 91* 98 97 100  CO2 23 23 24 25 26   GLUCOSE 211* 172* 140* 198* 136*  BUN 20 17 10 12 9   CREATININE 0.63 0.67 0.64 0.75 0.63  CALCIUM 8.4 8.0* 7.6* 7.7* 7.9*   CBC:  Recent Labs Lab 01/06/13 0545 01/07/13 0339 01/07/13 2005 01/08/13 0630 01/09/13 0342 01/10/13 0345  WBC 26.7* 29.1*  --  23.4* 19.8* 16.0*  NEUTROABS  --   --   --   --  16.1*  --   HGB 10.2* 8.5* 7.3* 7.5* 10.1* 9.4*  HCT 31.3* 25.5* 21.5* 22.1* 29.5* 28.6*  MCV  86.0 85.9  --  87.7 87.8 88.5  PLT 372 314  --  323 386 409*   BNP (last 3 results)  Recent Labs  01/02/13 0354  PROBNP 6103.0*   CBG:  Recent Labs Lab 01/09/13 0725 01/09/13 1344 01/09/13 1524 01/09/13 2230 01/10/13 0730  GLUCAP 152* 139* 143* 171* 117*    Recent Results (from the past 240 hour(s))  CULTURE, BLOOD (ROUTINE X 2)     Status: [name]   Collection Time    12/31/12  6:45 PM      Result Value Range Status   Specimen Description BLOOD RIGHT ARM   Final   Special Requests BOTTLES DRAWN AEROBIC AND ANAEROBIC   Final   Culture  Setup Time 12/31/2012 23:44   Final   Culture NO GROWTH 5  DAYS   Final   Report Status 01/06/2013 FINAL   Final  CULTURE, BLOOD (ROUTINE X 2)     Status: [name]   Collection Time    12/31/12  6:51 PM      Result Value Range Status   Specimen Description BLOOD RIGHT HAND   Final   Special Requests BOTTLES DRAWN AEROBIC AND ANAEROBIC   Final   Culture  Setup Time 12/31/2012 23:43   Final   Culture NO GROWTH 5 DAYS   Final   Report Status 01/06/2013 FINAL   Final  MRSA PCR SCREENING     Status: [name]   Collection Time    01/01/13  7:21 PM      Result Value Range Status   MRSA by PCR NEGATIVE  NEGATIVE Final   Comment:            The GeneXpert MRSA Assay (FDA     approved for NASAL specimens     only), is one component of a     comprehensive MRSA colonization     surveillance program. It is not     intended to diagnose MRSA     infection nor to guide or     monitor treatment for     MRSA infections.  SURGICAL PCR SCREEN     Status: [name]   Collection Time    01/06/13  5:56 PM      Result Value Range Status   MRSA, PCR NEGATIVE  NEGATIVE Final   Staphylococcus aureus NEGATIVE  NEGATIVE Final   Comment:            The Xpert SA Assay (FDA     approved for NASAL specimens     in patients over 11 years of age),     is one component of     a comprehensive surveillance     program.  Test performance has     been validated by [employer] for patients greater     than or equal to 59 year old.     It is not intended     to diagnose infection nor to     guide or monitor treatment.  ANAEROBIC CULTURE     Status: [name]   Collection Time    01/06/13  7:58 PM      Result Value Range Status   Specimen Description ARM LEFT   Final   Special Requests [name]   Final   Gram Stain     Final   Value: MODERATE WBC PRESENT,BOTH PMN AND MONONUCLEAR     NO SQUAMOUS EPITHELIAL CELLS SEEN     NO ORGANISMS SEEN  Culture     Final   Value: NO ANAEROBES ISOLATED; CULTURE IN PROGRESS FOR 5 DAYS   Report Status PENDING   Incomplete  WOUND CULTURE      Status: [name]   Collection Time    01/06/13  7:58 PM      Result Value Range Status   Specimen Description ARM LEFT   Final   Special Requests [name]   Final   Gram Stain     Final   Value: RARE WBC PRESENT, PREDOMINANTLY PMN     NO SQUAMOUS EPITHELIAL CELLS SEEN     NO ORGANISMS SEEN   Culture NO GROWTH 2 DAYS   Final   Report Status 01/09/2013 FINAL   Final  WOUND CULTURE     Status: [name]   Collection Time    01/06/13  8:21 PM      Result Value Range Status   Specimen Description ARM FOREARM LEFT   Final   Special Requests [name]   Final   Gram Stain     Final   Value: FEW WBC PRESENT,BOTH PMN AND MONONUCLEAR     NO SQUAMOUS EPITHELIAL CELLS SEEN     RARE GRAM POSITIVE COCCI     IN PAIRS   Culture     Final   Value: FEW STAPHYLOCOCCUS AUREUS     Note: RIFAMPIN AND GENTAMICIN SHOULD NOT BE USED AS SINGLE DRUGS FOR TREATMENT OF STAPH INFECTIONS.   Report Status 01/09/2013 FINAL   Final   Organism ID, Bacteria STAPHYLOCOCCUS AUREUS   Final  ANAEROBIC CULTURE     Status: [name]   Collection Time    01/06/13  8:21 PM      Result Value Range Status   Specimen Description ARM FOREARM LEFT   Final   Special Requests [name]   Final   Gram Stain     Final   Value: RARE WBC PRESENT, PREDOMINANTLY PMN     NO SQUAMOUS EPITHELIAL CELLS SEEN     NO ORGANISMS SEEN   Culture     Final   Value: NO ANAEROBES ISOLATED; CULTURE IN PROGRESS FOR 5 DAYS   Report Status PENDING   Incomplete     Studies: Dg Abd 1 View  01/08/2013   *RADIOLOGY REPORT*  Clinical Data: Constipation for the past week.  ABDOMEN - 1 VIEW  Comparison: No priors.  Findings: Gas and stool are seen scattered throughout the colon extending to the level of the distal rectum. Moderate volume of stool.  No pathologic distension of small bowel is noted.  No gross evidence of pneumoperitoneum.  IMPRESSION: 1.  Nonobstructive bowel gas pattern. 2.  No pneumoperitoneum. 3.  Moderate volume of stool compatible with reported history of  constipation.   Original Report Authenticated By: Trudie Reed, M.D.    Scheduled Meds: .  ceFAZolin (ANCEF) IV  2 g Intravenous Q8H  . ferrous sulfate  325 mg Oral TID PC  . insulin aspart  0-5 Units Subcutaneous QHS  . metoprolol tartrate  25 mg Oral BID  . polyethylene glycol  17 g Oral BID  . rivaroxaban  15 mg Oral BID WC   Followed by  . [START ON 01/28/2013] rivaroxaban  20 mg Oral Q supper  . sodium chloride  3 mL Intravenous Q12H   Continuous Infusions: . 0.9 % NaCl with KCl 20 mEq / L 75 mL/hr at 01/10/13 0356    Principal Problem:   Cellulitis Active Problems:   Hyponatremia   Hypokalemia  Fasting hyperglycemia   Leukocytosis   Hypotension   Diabetes mellitus type 2, noninsulin dependent   Hypertrophic cardiomyopathy   Atrial fibrillation   Acute blood loss anemia  Time spent:  35 minutes  Pamella Pert  [facility] Pager [phone] If 7PM-7AM, please contact night-coverage at [email or web address], password [facility] 01/10/2013, 8:10 AM  LOS: 10 days

## 2013-01-10 NOTE — Progress Notes (Signed)
Peripherally Inserted Central Catheter/Midline Placement  The IV Nurse has discussed with the patient and/or persons authorized to consent for the patient, the purpose of this procedure and the potential benefits and risks involved with this procedure.  The benefits include less needle sticks, lab draws from the catheter and patient may be discharged home with the catheter.  Risks include, but not limited to, infection, bleeding, blood clot (thrombus formation), and puncture of an artery; nerve damage and irregular heat beat.  Alternatives to this procedure were also discussed.  PICC/Midline Placement Documentation        Timmothy Sours 01/10/2013, 11:42 AM

## 2013-01-10 NOTE — Evaluation (Signed)
Occupational Therapy Evaluation Patient Details Name: [name] MRN: [id number] DOB: Dec 12, 1945 Today's Date: 01/10/2013 Time: 1410-1433 OT Time Calculation (min): 23 min  OT Assessment / Plan / Recommendation Clinical Impression  pt adm with cellulitis, s/p I and D of LUE and now with limitations with mobility due to medical complications. Pt with decreased I with ADL activity in which pt will benefit from skilled OT to increase I with ADL activity and return to PLOF    OT Assessment  Patient needs continued OT Services    Follow Up Recommendations  SNF       Equipment Recommendations  None recommended by OT       Frequency  Min 2X/week    Precautions / Restrictions Precautions Precautions: Fall Precaution Comments: multiple lines Required Braces or Orthoses: Other Brace/Splint Other Brace/Splint: LUE splint and 2 JP drains       ADL  Transfers/Ambulation Related to ADLs: Pt reports increased SOB in supine. OT assisted pt move to sitting position in which pt stated she was able to breathe easier. RN present.  Pt wanted to sit EOB rather than get in recliner or lie in bed    OT Diagnosis: Generalized weakness  OT Problem List: Decreased strength;Decreased activity tolerance OT Treatment Interventions: Self-care/ADL training;Patient/family education   OT Goals Acute Rehab OT Goals Time For Goal Achievement: 01/24/13 Potential to Achieve Goals: Good ADL Goals Pt Will Perform Grooming: with supervision;Standing at sink ADL Goal: Grooming - Progress: Goal set today Pt Will Perform Upper Body Bathing: with supervision;Sit to stand from chair ADL Goal: Upper Body Bathing - Progress: Goal set today Pt Will Perform Upper Body Dressing: Sit to stand from chair;with supervision ADL Goal: Upper Body Dressing - Progress: Goal set today Pt Will Perform Lower Body Dressing: with min assist;Sit to stand from chair ADL Goal: Lower Body Dressing - Progress: Goal set today Pt  Will Transfer to Toilet: with min assist;Comfort height toilet Pt Will Perform Toileting - Clothing Manipulation: with min assist;Standing ADL Goal: Toileting - Clothing Manipulation - Progress: Goal set today  Visit Information  Last OT Received On: 01/10/13    Subjective Data  Subjective: I am frustrated with being SOB ( nurse present)   Prior Functioning     Home Living Lives With: Other (Comment) Type of Home: House Home Access: Level entry Home Layout: Two level;Able to live on main level with bedroom/bathroom Alternate Level Stairs-Number of Steps: 14 Bathroom Shower/Tub: Network engineer: None Prior Function Level of Independence: Independent Communication Communication: No difficulties Dominant Hand: Right         Vision/Perception Vision - History Patient Visual Report: No change from baseline      Extremity/Trunk Assessment Right Upper Extremity Assessment RUE ROM/Strength/Tone: Within functional levels Left Upper Extremity Assessment LUE ROM/Strength/Tone: Deficits LUE ROM/Strength/Tone Deficits: L fingers and shoulder WFL.  Educated on keeping LUe elevated to decreased swelling, as well as moving fingers.     Mobility Bed Mobility Bed Mobility: Supine to Sit Supine to Sit: 3: Mod assist;HOB elevated Sitting - Scoot to Edge of Bed: 4: Min assist Transfers Transfers: Not assessed           End of Session OT - End of Session Activity Tolerance: Patient limited by fatigue Patient left: in bed;with call bell/phone within reach;with nursing in room  GO     Nury Nebergall, [place]ung 01/10/2013, 2:41 PM

## 2013-01-10 NOTE — Progress Notes (Signed)
[facility]  Day #2 cefazolin  Subjective: No new complaints   Antibiotics:  Anti-infectives   Start     Dose/Rate Route Frequency Ordered Stop   01/09/13 1430  ceFAZolin (ANCEF) IVPB 2 g/50 mL premix     2 g 100 mL/hr over 30 Minutes Intravenous 3 times per day 01/09/13 1416     01/09/13 1222  polymyxin B 500,000 Units, bacitracin 50,000 Units in sodium chloride irrigation 0.9 % 500 mL irrigation  Status:  Discontinued       As needed 01/09/13 1222 01/09/13 1254   01/09/13 1134  polymyxin B 500,000 Units, bacitracin 50,000 Units in sodium chloride irrigation 0.9 % 500 mL irrigation  Status:  Discontinued       As needed 01/09/13 1135 01/09/13 1254   01/09/13 1130  polymyxin B 500,000 Units, bacitracin 50,000 Units in sodium chloride irrigation 0.9 % 500 mL irrigation  Status:  Discontinued       As needed 01/09/13 1130 01/09/13 1254   01/07/13 1500  piperacillin-tazobactam (ZOSYN) IVPB 3.375 g  Status:  Discontinued     3.375 g 12.5 mL/hr over 240 Minutes Intravenous 3 times per day 01/07/13 1429 01/09/13 1416   01/06/13 2359  vancomycin (VANCOCIN) IVPB 1000 mg/200 mL premix  Status:  Discontinued     1,000 mg 200 mL/hr over 60 Minutes Intravenous Every 12 hours 01/06/13 2308 01/09/13 1416   01/05/13 0900  clindamycin (CLEOCIN) IVPB 600 mg  Status:  Discontinued     600 mg 100 mL/hr over 30 Minutes Intravenous Every 8 hours 01/05/13 0809 01/07/13 1415   01/03/13 1200  sulfamethoxazole-trimethoprim (BACTRIM DS) 800-160 MG per tablet 1 tablet  Status:  Discontinued     1 tablet Oral Every 12 hours 01/03/13 1134 01/05/13 0809   01/02/13 0900  piperacillin-tazobactam (ZOSYN) IVPB 3.375 g  Status:  Discontinued     3.375 g 12.5 mL/hr over 240 Minutes Intravenous Every 8 hours 01/02/13 0830 01/03/13 1134   01/01/13 2200  sulfamethoxazole-trimethoprim (BACTRIM DS) 800-160 MG per tablet 1 tablet  Status:  Discontinued     1 tablet Oral Every 12 hours  01/01/13 1858 01/02/13 0822   01/01/13 0600  vancomycin (VANCOCIN) IVPB 750 mg/150 ml premix  Status:  Discontinued     750 mg 150 mL/hr over 60 Minutes Intravenous Every 12 hours 12/31/12 1815 01/03/13 1134   01/01/13 0200  piperacillin-tazobactam (ZOSYN) IVPB 3.375 g  Status:  Discontinued     3.375 g 12.5 mL/hr over 240 Minutes Intravenous Every 8 hours 12/31/12 1815 01/01/13 1839   12/31/12 1830  piperacillin-tazobactam (ZOSYN) IVPB 3.375 g     3.375 g 100 mL/hr over 30 Minutes Intravenous  Once 12/31/12 1806 12/31/12 1931   12/31/12 1830  vancomycin (VANCOCIN) IVPB 750 mg/150 ml premix  Status:  Discontinued     750 mg 150 mL/hr over 60 Minutes Intravenous  Once 12/31/12 1806 01/02/13 0933   12/31/12 1615  clindamycin (CLEOCIN) IVPB 600 mg  Status:  Discontinued     600 mg 100 mL/hr over 30 Minutes Intravenous  Once 12/31/12 1608 12/31/12 1800      Medications: Scheduled Meds: .  ceFAZolin (ANCEF) IV  2 g Intravenous Q8H  . ferrous sulfate  325 mg Oral TID PC  . insulin aspart  0-5 Units Subcutaneous QHS  . metoprolol tartrate  25 mg Oral BID  . polyethylene glycol  17 g Oral BID  . rivaroxaban  15 mg Oral  BID WC   Followed by  . [START ON 01/28/2013] rivaroxaban  20 mg Oral Q supper  . sodium chloride  10-40 mL Intracatheter Q12H  . sodium chloride  3 mL Intravenous Q12H   Continuous Infusions: . 0.9 % NaCl with KCl 20 mEq / L 75 mL/hr at 01/10/13 1246   PRN Meds:.docusate sodium, methocarbamol (ROBAXIN) IV, methocarbamol, metoCLOPramide (REGLAN) injection, metoCLOPramide, ondansetron (ZOFRAN) IV, ondansetron (ZOFRAN) IV, ondansetron, ondansetron, oxyCODONE, senna, sodium chloride, sodium phosphate, traMADol   Objective: Weight change:   Intake/Output Summary (Last 24 hours) at 01/10/13 1713 Last data filed at 01/10/13 1600  Gross per 24 hour  Intake   2025 ml  Output   2797 ml  Net   -772 ml   Blood pressure 159/86, pulse 73, temperature 98.3 F (36.8 C),  temperature source Oral, resp. rate 20, height 5\' 4"  (1.626 m), weight 186 lb 15.2 oz (84.8 kg), SpO2 96.00%. Temp:  [98 F (36.7 C)-98.3 F (36.8 C)] 98.3 F (36.8 C) (06/09 1238) Pulse Rate:  [65-87] 73 (06/09 1238) Resp:  [18-22] 20 (06/09 1238) BP: (118-159)/(51-97) 159/86 mmHg (06/09 1238) SpO2:  [92 %-98 %] 96 % (06/09 1238)  Physical Exam: General: Alert and awake, oriented x3, not in any acute distress. HEENT: anicteric sclera,, EOMI CVS regular rate, normal r,  no murmur rubs or gallops Chest: clear to auscultation bilaterally, no wheezing, rales or rhonchi Abdomen: soft nontender, nondistended, normal bowel sounds, Extremities:left arm bandaged with JP drains in place Skin: no rashes Lymph: no new lymphadenopathy Neuro: nonfocal  Lab Results:  Recent Labs  01/09/13 0342 01/10/13 0345  WBC 19.8* 16.0*  HGB 10.1* 9.4*  HCT 29.5* 28.6*  PLT 386 409*    BMET  Recent Labs  01/09/13 0342 01/10/13 0345  NA 128* 132*  K 4.2 4.5  CL 97 100  CO2 25 26  GLUCOSE 198* 136*  BUN 12 9  CREATININE 0.75 0.63  CALCIUM 7.7* 7.9*    Micro Results: Recent Results (from the past 240 hour(s))  CULTURE, BLOOD (ROUTINE X 2)     Status: None   Collection Time    12/31/12  6:45 PM      Result Value Range Status   Specimen Description BLOOD RIGHT ARM   Final   Special Requests BOTTLES DRAWN AEROBIC AND ANAEROBIC   Final   Culture  Setup Time 12/31/2012 23:44   Final   Culture NO GROWTH 5 DAYS   Final   Report Status 01/06/2013 FINAL   Final  CULTURE, BLOOD (ROUTINE X 2)     Status: None   Collection Time    12/31/12  6:51 PM      Result Value Range Status   Specimen Description BLOOD RIGHT HAND   Final   Special Requests BOTTLES DRAWN AEROBIC AND ANAEROBIC   Final   Culture  Setup Time 12/31/2012 23:43   Final   Culture NO GROWTH 5 DAYS   Final   Report Status 01/06/2013 FINAL   Final  MRSA PCR SCREENING     Status: None   Collection Time    01/01/13  7:21  PM      Result Value Range Status   MRSA by PCR NEGATIVE  NEGATIVE Final   Comment:            The GeneXpert MRSA Assay (FDA     approved for NASAL specimens     only), is one component of a  comprehensive MRSA colonization     surveillance program. It is not     intended to diagnose MRSA     infection nor to guide or     monitor treatment for     MRSA infections.  SURGICAL PCR SCREEN     Status: None   Collection Time    01/06/13  5:56 PM      Result Value Range Status   MRSA, PCR NEGATIVE  NEGATIVE Final   Staphylococcus aureus NEGATIVE  NEGATIVE Final   Comment:            The Xpert SA Assay (FDA     approved for NASAL specimens     in patients over 65 years of age),     is one component of     a comprehensive surveillance     program.  Test performance has     been validated by [employer] for patients greater     than or equal to 5 year old.     It is not intended     to diagnose infection nor to     guide or monitor treatment.  ANAEROBIC CULTURE     Status: None   Collection Time    01/06/13  7:58 PM      Result Value Range Status   Specimen Description ARM LEFT   Final   Special Requests NONE   Final   Gram Stain     Final   Value: MODERATE WBC PRESENT,BOTH PMN AND MONONUCLEAR     NO SQUAMOUS EPITHELIAL CELLS SEEN     NO ORGANISMS SEEN   Culture     Final   Value: NO ANAEROBES ISOLATED; CULTURE IN PROGRESS FOR 5 DAYS   Report Status PENDING   Incomplete  WOUND CULTURE     Status: None   Collection Time    01/06/13  7:58 PM      Result Value Range Status   Specimen Description ARM LEFT   Final   Special Requests NONE   Final   Gram Stain     Final   Value: RARE WBC PRESENT, PREDOMINANTLY PMN     NO SQUAMOUS EPITHELIAL CELLS SEEN     NO ORGANISMS SEEN   Culture NO GROWTH 2 DAYS   Final   Report Status 01/09/2013 FINAL   Final  WOUND CULTURE     Status: None   Collection Time    01/06/13  8:21 PM      Result Value Range Status   Specimen  Description ARM FOREARM LEFT   Final   Special Requests NONE   Final   Gram Stain     Final   Value: FEW WBC PRESENT,BOTH PMN AND MONONUCLEAR     NO SQUAMOUS EPITHELIAL CELLS SEEN     RARE GRAM POSITIVE COCCI     IN PAIRS   Culture     Final   Value: FEW STAPHYLOCOCCUS AUREUS     Note: RIFAMPIN AND GENTAMICIN SHOULD NOT BE USED AS SINGLE DRUGS FOR TREATMENT OF STAPH INFECTIONS.   Report Status 01/09/2013 FINAL   Final   Organism ID, Bacteria STAPHYLOCOCCUS AUREUS   Final  ANAEROBIC CULTURE     Status: None   Collection Time    01/06/13  8:21 PM      Result Value Range Status   Specimen Description ARM FOREARM LEFT   Final   Special Requests NONE   Final   Gram  Stain     Final   Value: RARE WBC PRESENT, PREDOMINANTLY PMN     NO SQUAMOUS EPITHELIAL CELLS SEEN     NO ORGANISMS SEEN   Culture     Final   Value: NO ANAEROBES ISOLATED; CULTURE IN PROGRESS FOR 5 DAYS   Report Status PENDING   Incomplete    Studies/Results: No results found.    Assessment/Plan: [name] is a 67 y.o. female  LUE cellulitis due to infected olecranon bursistis with myofascitis due to MSSA s/p I x D x2   #1 MSSA olecranon bursitis, myofascitis: --I would go ahead and plan on 6 weeks of IV cefazolin, with day #1 being 01/10/13 first postop day from 2nd surgery --I will arrange HSFU with [place] in ID clinic in next 1-2 weeks and then again prior to stopping her abx --she will need to have weekly cbc, bmp faxed to [phone] attention to Dr. Daiva Eves.  #2 Screening: check HIV and Hep C  I will sign off for now.  Please call with further questions.    LOS: 10 days   Acey Lav 01/10/2013, 5:13 PM

## 2013-01-10 NOTE — Progress Notes (Signed)
Physical Therapy Treatment Patient Details Name: [name] MRN: [id number] DOB: [birth year] Today's Date: 01/10/2013 Time: 7829-5621 PT Time Calculation (min): 29 min  PT Assessment / Plan / Recommendation Comments on Treatment Session  pt progressing; BP much better today; still fatigues quickly and will benefit from continued PT in acute setting    Follow Up Recommendations  Home health PT;Supervision - Intermittent     Does the patient have the potential to tolerate intense rehabilitation     Barriers to Discharge        Equipment Recommendations  Other (comment)    Recommendations for Other Services    Frequency Min 3X/week   Plan Discharge plan remains appropriate;Frequency remains appropriate    Precautions / Restrictions Precautions Precautions: Fall Precaution Comments: multiple lines Required Braces or Orthoses: Other Brace/Splint Other Brace/Splint: LUE splint and 2 JP drains   Pertinent Vitals/Pain No c/o pain    Mobility  Bed Mobility Bed Mobility: Supine to Sit;Sitting - Scoot to Edge of Bed Supine to Sit: 3: Mod assist;HOB elevated Sitting - Scoot to [place] of Bed: 4: Min assist Details for Bed Mobility Assistance: cues for safety, allow time for line management; assist for UB and cues for technique Transfers Transfers: Sit to Stand;Stand to Sit;Stand Pivot Transfers Sit to Stand: From bed;1: +2 Total assist Sit to Stand: Patient Percentage: 70% Stand to Sit: 1: +2 Total assist;To chair/3-in-1 Stand to Sit: Patient Percentage: 70% Stand Pivot Transfers: 1: +2 Total assist Stand Pivot Transfers: Patient Percentage: 70% Details for Transfer Assistance: +2 for safety, line management, balance Ambulation/Gait Ambulation/Gait Assistance: 1: +2 Total assist Ambulation/Gait: Patient Percentage: 70% Ambulation Distance (Feet): 6 Feet Assistive device: 1 person hand held assist Ambulation/Gait Assistance Details: +2 for pt safety, line management and  balance;  Gait Pattern: Step-to pattern;Narrow base of support General Gait Details: pt unsteady,  3-4' fwd and then back to chair, pt mildly "woozy"    Exercises     PT Diagnosis:    PT Problem List:   PT Treatment Interventions:     PT Goals Acute Rehab PT Goals Time For Goal Achievement: 01/29/13 Potential to Achieve Goals: Good Pt will go Supine/Side to Sit: with modified independence PT Goal: Supine/Side to Sit - Progress: Progressing toward goal Pt will go Sit to Stand: with modified independence PT Goal: Sit to Stand - Progress: Progressing toward goal Pt will go Stand to Sit: with modified independence PT Goal: Stand to Sit - Progress: Progressing toward goal Pt will Ambulate: >150 feet;with supervision;with least restrictive assistive device PT Goal: Ambulate - Progress: Progressing toward goal  Visit Information  Last PT Received On: 01/10/13 Assistance Needed: +2    Subjective Data  Subjective: I want to get on potty chair Patient Stated Goal: home   Cognition  Cognition Arousal/Alertness: Awake/alert Behavior During Therapy: WFL for tasks assessed/performed Overall Cognitive Status: Within Functional Limits for tasks assessed    Balance  Static Sitting Balance Static Sitting - Balance Support: Right upper extremity supported;Feet supported Static Sitting - Level of Assistance: 5: Stand by assistance Static Standing Balance Static Standing - Balance Support: Left upper extremity supported;During functional activity Static Standing - Level of Assistance: 4: Min assist  End of Session PT - End of Session Equipment Utilized During Treatment: Gait belt Activity Tolerance: Patient tolerated treatment well Patient left: in chair;with call bell/phone within reach;with family/visitor present Nurse Communication: Mobility status   GP     [facility] 01/10/2013, 11:08 AM

## 2013-01-10 NOTE — Progress Notes (Signed)
Subjective: 1 Day Post-Op Procedure(s) (LRB): REDO INCISION AND DRAINAGE LEFT ELBOW (Left) SECONDARY CLOSURE OF WOUND  LEFT ELBOW (Left) APPLICATION OF WOUND VAC (Left)  Post op day #4 initial I&D left arm for septic olecranon bursitis and myofascitis MSSA. Medicine's help, cardiology input and ID consult appreciated.   Patient reports pain as 4 on 0-10 scale and 5 on 0-10 scale.    Objective: Vital signs in last 24 hours: Temp:  [97.7 F (36.5 C)-98.3 F (36.8 C)] 98.3 F (36.8 C) (06/09 0803) Pulse Rate:  [65-87] 65 (06/09 0400) Resp:  [11-23] 19 (06/09 0400) BP: (95-146)/(48-97) 121/52 mmHg (06/09 0400) SpO2:  [92 %-99 %] 97 % (06/09 0400)  Intake/Output from previous day: 06/08 0701 - 06/09 0700 In: 3780 [I.V.:3250; IV Piggyback:475] Out: 2677 [Urine:2450; Drains:27; Blood:200] Intake/Output this shift:     Recent Labs  01/07/13 2005 01/08/13 0630 01/09/13 0342 01/10/13 0345  HGB 7.3* 7.5* 10.1* 9.4*    Recent Labs  01/09/13 0342 01/10/13 0345  WBC 19.8* 16.0*  RBC 3.36* 3.23*  HCT 29.5* 28.6*  PLT 386 409*    Recent Labs  01/09/13 0342 01/10/13 0345  NA 128* 132*  K 4.2 4.5  CL 97 100  CO2 25 26  BUN 12 9  CREATININE 0.75 0.63  GLUCOSE 198* 136*  CALCIUM 7.7* 7.9*    Recent Labs  01/08/13 1010  INR 1.34    Neurologically intact ABD soft Neurovascular intact Sensation intact distally Intact pulses distally Incision: dressing C/D/I and moderate drainage JPs x2 draining serous fluid no purulence in moderate amounts Drains are sewn in place.  Assessment/Plan: 1 Day Post-Op Procedure(s) (LRB): REDO INCISION AND DRAINAGE LEFT ELBOW (Left) SECONDARY CLOSURE OF WOUND  LEFT ELBOW (Left) APPLICATION OF WOUND VAC (Left)    Advance diet Up with therapy Continue ABX therapy due to Culture taken of surgical site during initial I&D. Discharge to SNF patient reports she may wish to go to an interim care facility. Up with PT and OT today sling  for left arm when up. If able to mobilize then consider for transfer to telemetry  Floor.   [name] 01/10/2013, 8:55 AM

## 2013-01-11 LAB — ANAEROBIC CULTURE

## 2013-01-11 LAB — CBC
HCT: 32.4 % — ABNORMAL LOW (ref 36.0–46.0)
MCV: 89.5 fL (ref 78.0–100.0)
Platelets: 635 10*3/uL — ABNORMAL HIGH (ref 150–400)
RBC: 3.62 MIL/uL — ABNORMAL LOW (ref 3.87–5.11)
RDW: 13.8 % (ref 11.5–15.5)
WBC: 14.8 10*3/uL — ABNORMAL HIGH (ref 4.0–10.5)

## 2013-01-11 LAB — BASIC METABOLIC PANEL
CO2: 26 mEq/L (ref 19–32)
Chloride: 98 mEq/L (ref 96–112)
GFR calc Af Amer: >90 mL/min (ref >90)
Potassium: 4.1 mEq/L (ref 3.5–5.1)
Sodium: 132 mEq/L — ABNORMAL LOW (ref 135–145)

## 2013-01-11 LAB — GLUCOSE, CAPILLARY
Glucose-Capillary: 136 mg/dL — ABNORMAL HIGH (ref 70–99)
Glucose-Capillary: 156 mg/dL — ABNORMAL HIGH (ref 70–99)
Glucose-Capillary: 194 mg/dL — ABNORMAL HIGH (ref 70–99)

## 2013-01-11 LAB — HEPATITIS C ANTIBODY (REFLEX): HCV Ab: NEGATIVE

## 2013-01-11 LAB — SEDIMENTATION RATE: Sed Rate: 99 mm/hr — ABNORMAL HIGH (ref 0–22)

## 2013-01-11 MED ORDER — METOPROLOL TARTRATE 50 MG PO TABS
50.0000 mg | ORAL_TABLET | Freq: Two times a day (BID) | ORAL | Status: DC
  Administered 2013-01-11 – 2013-01-17 (×13): 50 mg via ORAL
  Filled 2013-01-11 (×14): qty 1

## 2013-01-11 NOTE — Progress Notes (Signed)
Subjective:  Back up on floor today.  C/o one episode of SOB yesterday.  No chest pain.  Remains in sinus.  Objective:  Vital Signs in the last 24 hours: BP 109/90  Pulse 72  Temp(Src) 98 F (36.7 C) (Oral)  Resp 16  Ht 5\' 4"  (1.626 m)  Wt 77.111 kg (170 lb)  BMI 29.17 kg/m2  SpO2 96%  Physical Exam: Obese white female in no acute distress Lungs:  Reduced breath sounds Cardiac:  Regular rhythm, normal S1 and S2, no S3,2/6 murmur noted when sitting up today Extremities:  Left arm bandaged  Intake/Output from previous day: 06/09 0701 - 06/10 0700 In: 2850 [P.O.:920; I.V.:1725; IV Piggyback:205] Out: 3225 [Urine:3170; Drains:55]  Weight Filed Weights   01/07/13 0015 01/08/13 0400 01/10/13 2058  Weight: 81.3 kg (179 lb 3.7 oz) 84.8 kg (186 lb 15.2 oz) 77.111 kg (170 lb)    Lab Results: Basic Metabolic Panel:  Recent Labs  28/41/32 0345 01/11/13 0500  NA 132* 132*  K 4.5 4.1  CL 100 98  CO2 26 26  GLUCOSE 136* 108*  BUN 9 8  CREATININE 0.63 0.55    CBC:  Recent Labs  01/09/13 0342 01/10/13 0345 01/11/13 0500  WBC 19.8* 16.0* 14.8*  NEUTROABS 16.1*  --   --   HGB 10.1* 9.4* 10.8*  HCT 29.5* 28.6* 32.4*  MCV 87.8 88.5 89.5  PLT 386 409* 635*   BNP    Component Value Date/Time   PROBNP 6103.0* 01/02/2013 0354    Telemetry: Sinus rhythm  ECHO Hypertrophic cardiomyopathy with mild SAM of anterior leaflet of MV, Moderate to severe LA enlargement  Assessment/Plan:  1. Hypertrophic cardiomyopathy with SAM of mitral valve.  Murmur is louder that may reflect sitting up and positional changes 3. Atrial fibrillation no recurrence 4. Hyponatremia improved 5. DVT of left arm  Recommendations:  I would reduce fluids for now and increase metoprolol to 50 mg BID.  Awaiting d/c disposition.  Darden Palmer  MD [facility]  01/11/2013, 8:28 AM

## 2013-01-11 NOTE — Progress Notes (Signed)
Clinical Social Work  CSW provided patient with bed offers. Patient will ask family to tour [address] and [place] today. Patient agreeable to have a decision in the morning. CSW will continue to follow.  Unk Lightning, LCSW (Coverage for [employer])

## 2013-01-11 NOTE — Progress Notes (Addendum)
Clinical Social Work Department CLINICAL SOCIAL WORK PLACEMENT NOTE 01/11/2013  Patient:  [name], [name]  Account Number:  [id number] Admit date:  12/31/2012  Clinical Social Worker:  Orpah Greek  Date/time:  01/11/2013 01:09 PM  Clinical Social Work is seeking post-discharge placement for this patient at the following level of care:   SKILLED NURSING   (*CSW will update this form in Epic as items are completed)   01/11/2013  Patient/family provided with [facility] Department of Clinical Social Work's list of facilities offering this level of care within the geographic area requested by the patient (or if unable, by the patient's family).  01/11/2013  Patient/family informed of their freedom to choose among providers that offer the needed level of care, that participate in Medicare, Medicaid or managed care program needed by the patient, have an available bed and are willing to accept the patient.  01/11/2013  Patient/family informed of [facility]' ownership interest in [facility], as well as of the fact that they are under no obligation to receive care at this facility.  PASARR submitted to EDS on 01/11/2013 PASARR number received from EDS on 01/11/2013  FL2 transmitted to all facilities in geographic area requested by pt/family on  01/11/2013 FL2 transmitted to all facilities within larger geographic area on   Patient informed that his/her managed care company has contracts with or will negotiate with  certain facilities, including the following:     Patient/family informed of bed offers received:  01/13/13 Patient chooses bed at [facility] Physician recommends and patient chooses bed at    Patient to be transferred to [facility] on  01/17/13 Patient to be transferred to facility by [facility]  The following physician request were entered in Epic:   Additional Comments: Unice Bailey, LCSW [facility] Clinical Social Worker cell #: [phone]

## 2013-01-11 NOTE — Progress Notes (Signed)
Subjective: 2 Days Post-Op Procedure(s) (LRB): REDO INCISION AND DRAINAGE LEFT ELBOW (Left) SECONDARY CLOSURE OF WOUND  LEFT ELBOW (Left) APPLICATION OF WOUND VAC (Left) Awake, alert and oriented x 4. I feel better. PICCU line right arm. Drains still with significant out put  30 cc and 55 cc. Need to keep JPs charged to keep flaps under suction and prevent accumilation of fluid in the subcutaeous areas of dead space. When the drains decrease their out put < 30cc per day then will d/c the drains. Patient reports pain as 5 on 0-10 scale and 6 on 0-10 scale.    Objective: Vital signs in last 24 hours: Temp:  [97.9 F (36.6 C)-98 F (36.7 C)] 98 F (36.7 C) (06/10 0515) Pulse Rate:  [72-80] 72 (06/10 0515) Resp:  [16-20] 16 (06/10 0515) BP: (109-142)/(79-90) 109/90 mmHg (06/10 0515) SpO2:  [96 %-97 %] 96 % (06/10 0515) Weight:  [77.111 kg (170 lb)] 77.111 kg (170 lb) (06/09 2058)  Intake/Output from previous day: 06/09 0701 - 06/10 0700 In: 2850 [P.O.:920; I.V.:1725; IV Piggyback:205] Out: 3225 [Urine:3170; Drains:55] Intake/Output this shift: Total I/O In: 320 [P.O.:320] Out: 800 [Urine:800]   Recent Labs  01/09/13 0342 01/10/13 0345 01/11/13 0500  HGB 10.1* 9.4* 10.8*    Recent Labs  01/10/13 0345 01/11/13 0500  WBC 16.0* 14.8*  RBC 3.23* 3.62*  HCT 28.6* 32.4*  PLT 409* 635*    Recent Labs  01/10/13 0345 01/11/13 0500  NA 132* 132*  K 4.5 4.1  CL 100 98  CO2 26 26  BUN 9 8  CREATININE 0.63 0.55  GLUCOSE 136* 108*  CALCIUM 7.9* 8.6   No results found for this basename: LABPT, INR,  in the last 72 hours  Neurologically intact ABD soft Neurovascular intact Sensation intact distally Intact pulses distally Dorsiflexion/Plantar flexion intact Left UE dressing is dry, posterior splint in good condition.  Assessment/Plan: 2 Days Post-Op Procedure(s) (LRB): REDO INCISION AND DRAINAGE LEFT ELBOW (Left) SECONDARY CLOSURE OF WOUND  LEFT ELBOW  (Left) APPLICATION OF WOUND VAC (Left)  Advance diet Up with therapy Continue ABX therapy due to Post-op infection  [place] Riesen E 01/11/2013, 1:49 PM

## 2013-01-11 NOTE — Progress Notes (Signed)
ANTICOAGULATION CONSULT NOTE - Follow-up Consult  Pharmacy Consult for Xarelto Indication: atrial fibrillation, DVT  No Known Allergies  Patient Measurements: Height: 5\' 4"  (162.6 cm) Weight: 170 lb (77.111 kg) IBW/kg (Calculated) : 54.7  Vital Signs: Temp: 98 F (36.7 C) (06/10 0515) Temp src: Oral (06/10 0515) BP: 109/90 mmHg (06/10 0515) Pulse Rate: 72 (06/10 0515)  Labs:  Recent Labs  01/09/13 0342 01/10/13 0345 01/11/13 0500  HGB 10.1* 9.4* 10.8*  HCT 29.5* 28.6* 32.4*  PLT 386 409* 635*  APTT 34  --   --   CREATININE 0.75 0.63 0.55   Estimated Creatinine Clearance: 70.5 ml/min (by C-G formula based on Cr of 0.55).  Medical History: Past Medical History  Diagnosis Date  . Diabetes mellitus without complication     dx on wed  . Mitral valve prolapse   . Atrial flutter    Assessment: 67 yo F with new onset atrial fibrillation and LUE DVT noted on venous Doppler.  Patient was started on Lovenox for afib on 6/2, switch to Xarelto due to DVT.    Renal function stable, CrCl~70 ml/min.    H/H stable  Verapamil has been d/c'd (drug interaction)  Patient has had several interruptions in Xarelto administration due to various procedures. Patient received 1 of 2 doses on 6/4, 6/6, and 6/8 (only received 15mg  theses days, not 2 doses of 15mg ) and no Xarelto was given on 6/5 or 6/7. Because of this, will change the 21-day date range to 6/9-6/29, which is the 1st day the patient received both doses of Xarelto 15mg . On 6/30, patient is to transition to Xarelto 20mg  daily.  Goal of Therapy:  Treatment of DVT Monitor platelets by anticoagulation protocol: Yes   Plan:   Xarelto 15 mg PO twice daily with meals x 21 days (6/9-6/29), Xarelto 20 mg once daily with evening meal to start on 6/30.   Darrol Angel, PharmD Pager: [phone] 01/11/2013, 10:47 AM

## 2013-01-11 NOTE — Progress Notes (Signed)
[facility] PROGRESS NOTE  [name] NWG:[id number] DOB: 1946/01/23 DOA: 12/31/2012 PCP: [name], MD  67 y/o CF with history of DM presenting with LUE cellulitis.  Initially hypotensive has improved after broad spectrum antibiotics and IVF's. Developed atrial flutter and cardiology subsequently consulted.  Atrial flutter/fibrillation controlled with B blocker currently. Patient transitioned from zosyn/vanc to bactrim. WBC slightly up this am.  Ultrasound of LUE to assess for abscesses or DVT has been ordered.  Assessment/Plan  Cellulitis/left olecranon bursitis - patient initially failed outpatient therapy. MRI done here in the hospital showed left infected olecranon bursitis and suspect myofasciitis. Orthopedic surgery have been consulted, patient is now status post excisional debridement of the left elbow and forearm on 6/5 and closure with drains 6/8. Wound culture from the initial surgery showed MSSA. Infectious disease have been consulted, and recommended cefazolin to be continued for 4 weeks starting 6/6, scheduled to be done on 02/04/2013. ID will arrange for followup in the clinic.  - Following the initial surgery on 6/5, patient was persistently hypotensive, requiring to be in the step down unit up until 6/9. - She had pretty significant leukocytosis up to 29,000, now improving. Left arm DVT- on Xarelto started on 6/4 Atrial flutter - sinus rhythm this morning, no tachycardia noted. Cardiology had been consulted, and patient now is on metoprolol. Hyponatremia - TSH borderline, normal free T4; cortisol appropriately elevated. Constipation- resolved with enema Hypokalemia - Resolved  Hypotension - resolved, this was likely due to sepsis versus verapamil which she was on initially for her atrial flutter. DM II - continue SSI  ABLA - monitor and transfuse as needed. Stable now.  Code Status: Full code  Family Communication: none Disposition Plan: SNF when medically  stable, likely 2-3 days per ortho and depending on her elbow drains output  Consultants:  Cardiology Orthopedic surgery ID  Procedures:  None  Antibiotics:  Vancomycin and Zosyn 5/30 to 6/2 Bactrim 6/02 - 6/4 Clindamycin 6/4 >> 6/6 Vancomycin 6/6 >> 6/8 Zosyn 6/6 >> 6/8 Cefazolin 6/8 >> planned for 4 weeks until 02/04/2013  HPI/Subjective: - Feels well this morning  Objective: Filed Vitals:   01/10/13 1208 01/10/13 1238 01/10/13 2058 01/11/13 0515  BP:  159/86 142/79 109/90  Pulse:  73 80 72  Temp: 98 F (36.7 C) 98.3 F (36.8 C) 97.9 F (36.6 C) 98 F (36.7 C)  TempSrc: Oral Oral Oral Oral  Resp:  20 20 16   Height:   5\' 4"  (1.626 m)   Weight:   77.111 kg (170 lb)   SpO2:  96% 97% 96%    Intake/Output Summary (Last 24 hours) at 01/11/13 0819 Last data filed at 01/11/13 0804  Gross per 24 hour  Intake   2775 ml  Output   3775 ml  Net  -1000 ml   Filed Weights   01/07/13 0015 01/08/13 0400 01/10/13 2058  Weight: 81.3 kg (179 lb 3.7 oz) 84.8 kg (186 lb 15.2 oz) 77.111 kg (170 lb)   Exam:  General:  Pt in NAD, Alert and awake  Cardiovascular: RRR, no MRG  Respiratory: CTA BL, no wheezes  Abdomen: soft, NT, ND  Musculoskeletal: no cyanosis or clubbing; left elbow dressing in place, drains in place with serosanguinous drainage.   Data Reviewed: Basic Metabolic Panel:  Recent Labs Lab 01/07/13 0339 01/08/13 0630 01/09/13 0342 01/10/13 0345 01/11/13 0500  NA 122* 129* 128* 132* 132*  K 4.2 4.3 4.2 4.5 4.1  CL 91* 98 97 100 98  CO2  23 24 25 26 26   GLUCOSE 172* 140* 198* 136* 108*  BUN 17 10 12 9 8   CREATININE 0.67 0.64 0.75 0.63 0.55  CALCIUM 8.0* 7.6* 7.7* 7.9* 8.6   CBC:  Recent Labs Lab 01/07/13 0339 01/07/13 2005 01/08/13 0630 01/09/13 0342 01/10/13 0345 01/11/13 0500  WBC 29.1*  --  23.4* 19.8* 16.0* 14.8*  NEUTROABS  --   --   --  16.1*  --   --   HGB 8.5* 7.3* 7.5* 10.1* 9.4* 10.8*  HCT 25.5* 21.5* 22.1* 29.5* 28.6* 32.4*  MCV  85.9  --  87.7 87.8 88.5 89.5  PLT 314  --  323 386 409* 635*   BNP (last 3 results)  Recent Labs  01/02/13 0354  PROBNP 6103.0*   CBG:  Recent Labs Lab 01/10/13 0730 01/10/13 1250 01/10/13 1647 01/10/13 2056 01/11/13 0740  GLUCAP 117* 121* 111* 127* 136*   Recent Results (from the past 240 hour(s))  MRSA PCR SCREENING     Status: None   Collection Time    01/01/13  7:21 PM      Result Value Range Status   MRSA by PCR NEGATIVE  NEGATIVE Final   Comment:            The GeneXpert MRSA Assay (FDA     approved for NASAL specimens     only), is one component of a     comprehensive MRSA colonization     surveillance program. It is not     intended to diagnose MRSA     infection nor to guide or     monitor treatment for     MRSA infections.  SURGICAL PCR SCREEN     Status: None   Collection Time    01/06/13  5:56 PM      Result Value Range Status   MRSA, PCR NEGATIVE  NEGATIVE Final   Staphylococcus aureus NEGATIVE  NEGATIVE Final   Comment:            The Xpert SA Assay (FDA     approved for NASAL specimens     in patients over 56 years of age),     is one component of     a comprehensive surveillance     program.  Test performance has     been validated by [employer] for patients greater     than or equal to 53 year old.     It is not intended     to diagnose infection nor to     guide or monitor treatment.  ANAEROBIC CULTURE     Status: None   Collection Time    01/06/13  7:58 PM      Result Value Range Status   Specimen Description ARM LEFT   Final   Special Requests NONE   Final   Gram Stain     Final   Value: MODERATE WBC PRESENT,BOTH PMN AND MONONUCLEAR     NO SQUAMOUS EPITHELIAL CELLS SEEN     NO ORGANISMS SEEN   Culture     Final   Value: NO ANAEROBES ISOLATED; CULTURE IN PROGRESS FOR 5 DAYS   Report Status PENDING   Incomplete  WOUND CULTURE     Status: None   Collection Time    01/06/13  7:58 PM      Result Value Range Status    Specimen Description ARM LEFT   Final   Special Requests NONE  Final   Gram Stain     Final   Value: RARE WBC PRESENT, PREDOMINANTLY PMN     NO SQUAMOUS EPITHELIAL CELLS SEEN     NO ORGANISMS SEEN   Culture NO GROWTH 2 DAYS   Final   Report Status 01/09/2013 FINAL   Final  WOUND CULTURE     Status: None   Collection Time    01/06/13  8:21 PM      Result Value Range Status   Specimen Description ARM FOREARM LEFT   Final   Special Requests NONE   Final   Gram Stain     Final   Value: FEW WBC PRESENT,BOTH PMN AND MONONUCLEAR     NO SQUAMOUS EPITHELIAL CELLS SEEN     RARE GRAM POSITIVE COCCI     IN PAIRS   Culture     Final   Value: FEW STAPHYLOCOCCUS AUREUS     Note: RIFAMPIN AND GENTAMICIN SHOULD NOT BE USED AS SINGLE DRUGS FOR TREATMENT OF STAPH INFECTIONS.   Report Status 01/09/2013 FINAL   Final   Organism ID, Bacteria STAPHYLOCOCCUS AUREUS   Final  ANAEROBIC CULTURE     Status: None   Collection Time    01/06/13  8:21 PM      Result Value Range Status   Specimen Description ARM FOREARM LEFT   Final   Special Requests NONE   Final   Gram Stain     Final   Value: RARE WBC PRESENT, PREDOMINANTLY PMN     NO SQUAMOUS EPITHELIAL CELLS SEEN     NO ORGANISMS SEEN   Culture     Final   Value: NO ANAEROBES ISOLATED; CULTURE IN PROGRESS FOR 5 DAYS   Report Status PENDING   Incomplete    Scheduled Meds: .  ceFAZolin (ANCEF) IV  2 g Intravenous Q8H  . ferrous sulfate  325 mg Oral TID PC  . insulin aspart  0-5 Units Subcutaneous QHS  . metoprolol tartrate  25 mg Oral BID  . polyethylene glycol  17 g Oral BID  . rivaroxaban  15 mg Oral BID WC   Followed by  . [START ON 01/28/2013] rivaroxaban  20 mg Oral Q supper  . sodium chloride  10-40 mL Intracatheter Q12H  . sodium chloride  3 mL Intravenous Q12H   Continuous Infusions: . 0.9 % NaCl with KCl 20 mEq / L 75 mL/hr at 01/11/13 0144   Principal Problem:   Cellulitis Active Problems:   Hyponatremia   Hypokalemia   Fasting  hyperglycemia   Leukocytosis   Hypotension   Diabetes mellitus type 2, noninsulin dependent   Hypertrophic cardiomyopathy   Atrial fibrillation   Acute blood loss anemia  Time spent:  25 minutes  Pamella Pert  [facility] Pager [phone] If 7PM-7AM, please contact night-coverage at [email or web address], password [facility] 01/11/2013, 8:19 AM  LOS: 11 days

## 2013-01-11 NOTE — Progress Notes (Signed)
Clinical Social Work Department BRIEF PSYCHOSOCIAL ASSESSMENT 01/11/2013  Patient:  [name], [name]     Account Number:  [id number]     Admit date:  12/31/2012  Clinical Social Worker:  Orpah Greek  Date/Time:  01/11/2013 01:00 PM  Referred by:  Physician  Date Referred:  01/11/2013 Referred for  SNF Placement   Other Referral:   Interview type:  Patient Other interview type:    PSYCHOSOCIAL DATA Living Status:  FAMILY Admitted from facility:   Level of care:   Primary support name:  [name] (grandson) ph#: [phone] Primary support relationship to patient:  FAMILY Degree of support available:   good    CURRENT CONCERNS Current Concerns  Post-Acute Placement   Other Concerns:    SOCIAL WORK ASSESSMENT / PLAN CSW received consult for patient needing SNF placement as she will be on 6 weeks of IV Antibiotics (Ancef) & woundvac. CSW spoke with patient who is agreeable with plan for SNF.   Assessment/plan status:  Information/Referral to [facility] Other assessment/ plan:   Information/referral to community resources:   CSW completed FL2 and faxed information out to [facility] - will provide bed offers when available.    PATIENT'S/FAMILY'S RESPONSE TO PLAN OF CARE: Patient states that though she lives with her 65 year old grandson & daughter, [name] (who is currently @ [facility] for treatment), she does feel that going to a SNF is a good plan. She states that her sister, [name] who lives in [place] and who is, like the patient - a retired [employer] fundraiser, would possibly be able to take care of patient once she feels she can discharge from the SNF.       Unice Bailey, LCSW [facility] Clinical Social Worker cell #: [phone]

## 2013-01-12 LAB — BASIC METABOLIC PANEL
CO2: 29 mEq/L (ref 19–32)
Chloride: 98 mEq/L (ref 96–112)
Glucose, Bld: 172 mg/dL — ABNORMAL HIGH (ref 70–99)
Sodium: 134 mEq/L — ABNORMAL LOW (ref 135–145)

## 2013-01-12 LAB — CBC
Hemoglobin: 10.1 g/dL — ABNORMAL LOW (ref 12.0–15.0)
MCH: 28.8 pg (ref 26.0–34.0)
Platelets: 628 10*3/uL — ABNORMAL HIGH (ref 150–400)
RBC: 3.51 MIL/uL — ABNORMAL LOW (ref 3.87–5.11)
WBC: 19 10*3/uL — ABNORMAL HIGH (ref 4.0–10.5)

## 2013-01-12 LAB — GLUCOSE, CAPILLARY
Glucose-Capillary: 161 mg/dL — ABNORMAL HIGH (ref 70–99)
Glucose-Capillary: 172 mg/dL — ABNORMAL HIGH (ref 70–99)
Glucose-Capillary: 152 mg/dL — ABNORMAL HIGH (ref 70–99)

## 2013-01-12 LAB — HIV-1 RNA QUANT-NO REFLEX-BLD: HIV-1 RNA Quant, Log: <1.3 {Log} (ref <1.30)

## 2013-01-12 MED ORDER — DILTIAZEM HCL ER COATED BEADS 180 MG PO CP24
180.0000 mg | ORAL_CAPSULE | Freq: Every day | ORAL | Status: DC
  Administered 2013-01-12 – 2013-01-13 (×2): 180 mg via ORAL
  Filled 2013-01-12 (×3): qty 1

## 2013-01-12 MED ORDER — DILTIAZEM HCL 100 MG IV SOLR
5.0000 mg/h | INTRAVENOUS | Status: DC
  Administered 2013-01-12: 5 mg/h via INTRAVENOUS
  Filled 2013-01-12: qty 100

## 2013-01-12 NOTE — Progress Notes (Signed)
Patient's heart rhythm converted to atrial fib, rate controlled. Patient is asymptomatic. Merdis Delay NP was made aware. Will contiue to monitor patient.

## 2013-01-12 NOTE — Progress Notes (Addendum)
Patient's heart rhythm has converted back to normal sinus rhythm, HR at 60-70's. Unable to reach cardiologist on call due to phone system that is down since 4:30 am. K. Schorr was informed and she ordered to discontinue cardizem drip. Finally able to talk to Dr. Donnie Aho just now, agreed to discontinue cardizem drip.

## 2013-01-12 NOTE — Progress Notes (Signed)
Clinical Social Work  Patient has selected a bed at [facility]. SNF agreeable to accept patient when medically stable. CSW will continue to follow.  Unk Lightning, LCSW (Coverage for [employer])

## 2013-01-12 NOTE — Progress Notes (Signed)
Continued spiritual and emotional support around pt's illness, daughter's hospitalization at [facility] and support / discharge plans.

## 2013-01-12 NOTE — Progress Notes (Signed)
PT Cancellation Note  Patient Details Name: JUELZ CLAAR MRN: [id number] DOB: [birth year]   Cancelled Treatment:    Reason Eval/Treat Not Completed: Pain limiting ability to participate Pt reports little sleep last night due to pain and pain just now under control so pt does not wish to participate today.     Traci Plemons,KATHrine E 01/12/2013, 2:28 PM Pager: [phone]

## 2013-01-12 NOTE — Progress Notes (Signed)
[facility] PROGRESS NOTE  [name] ZOX:[id number] DOB: 10-May-1946 DOA: 12/31/2012 PCP: [name], MD  Assessment/Plan:  Cellulitis/left olecranon bursitis - patient initially failed outpatient therapy. MRI done here in the hospital showed left infected olecranon bursitis and suspect myofasciitis. Orthopedic surgery have been consulted, patient is now status post excisional debridement of the left elbow and forearm on 6/5 and closure with drains 6/8. Wound culture from the initial surgery showed MSSA. Infectious disease have been consulted, and recommended cefazolin to be continued for 4 weeks starting 6/6, scheduled to be done on 02/04/2013. ID will arrange for followup in the clinic.  - Following the initial surgery on 6/5, patient was persistently hypotensive, requiring to be in the step down unit up until 6/9.  - She had pretty significant leukocytosis up to 29,000, now improving. Patient is currently on ancef - Drains in place and ortho on board and managing.  Left arm DVT - on Xarelto started on 6/4   Atrial flutter -  - cardiology on board and managing.  - Cardizem added  Hyponatremia  - TSH borderline, normal free T4; cortisol appropriately elevated. Improving currently.  Constipation - resolved.  DM II  - continue SSI and diabetic diet.  Blood sugars have been relatively well controlled.  ABLA  - monitor and transfuse as needed. Stable now.  Code Status: full Family Communication: Discussed with patient and family  Disposition Plan: Once cleared by cardiology and Ortho will plan on discharging patient to SNF to complete IV antibiotic therapy as per recommendations by ID   Consultants:  Cardiology  Ortho  ID  Procedures:  I and D  Antibiotics:  cefazolin  HPI/Subjective: No new complaints. No acute issues overnight.  Still having discomfort at left arm.  Objective: Filed Vitals:   01/12/13 0240 01/12/13 0320 01/12/13 0603 01/12/13  1426  BP: 148/68 128/71 141/58 160/67  Pulse: 115 112 67 62  Temp:   98.1 F (36.7 C) 98.5 F (36.9 C)  TempSrc:   Oral Oral  Resp: 18 17 18 19   Height:      Weight:      SpO2: 94% 94% 92% 96%    Intake/Output Summary (Last 24 hours) at 01/12/13 1451 Last data filed at 01/12/13 1426  Gross per 24 hour  Intake 1313.42 ml  Output 2972.5 ml  Net -1659.08 ml   Filed Weights   01/07/13 0015 01/08/13 0400 01/10/13 2058  Weight: 81.3 kg (179 lb 3.7 oz) 84.8 kg (186 lb 15.2 oz) 77.111 kg (170 lb)    Exam:   General:  Pt in NAD, Alert and Awake  Cardiovascular: irregularly irregular, no murmurs  Respiratory: cta bl, no wheezes  Abdomen: soft, NT, ND  Musculoskeletal: sensation to light touch intact at finger tips of both hands. Left arm wrapped in ace bandages. Drains in place.   Data Reviewed: Basic Metabolic Panel:  Recent Labs Lab 01/08/13 0630 01/09/13 0342 01/10/13 0345 01/11/13 0500 01/12/13 0430  NA 129* 128* 132* 132* 134*  K 4.3 4.2 4.5 4.1 3.9  CL 98 97 100 98 98  CO2 24 25 26 26 29   GLUCOSE 140* 198* 136* 108* 172*  BUN 10 12 9 8 9   CREATININE 0.64 0.75 0.63 0.55 0.57  CALCIUM 7.6* 7.7* 7.9* 8.6 8.5   Liver Function Tests: No results found for this basename: AST, ALT, ALKPHOS, BILITOT, PROT, ALBUMIN,  in the last 168 hours No results found for this basename: LIPASE, AMYLASE,  in the last 168 hours  No results found for this basename: AMMONIA,  in the last 168 hours CBC:  Recent Labs Lab 01/08/13 0630 01/09/13 0342 01/10/13 0345 01/11/13 0500 01/12/13 0430  WBC 23.4* 19.8* 16.0* 14.8* 19.0*  NEUTROABS  --  16.1*  --   --   --   HGB 7.5* 10.1* 9.4* 10.8* 10.1*  HCT 22.1* 29.5* 28.6* 32.4* 31.2*  MCV 87.7 87.8 88.5 89.5 88.9  PLT 323 386 409* 635* 628*   Cardiac Enzymes: No results found for this basename: CKTOTAL, CKMB, CKMBINDEX, TROPONINI,  in the last 168 hours BNP (last 3 results)  Recent Labs  01/02/13 0354  PROBNP 6103.0*    CBG:  Recent Labs Lab 01/11/13 1201 01/11/13 1610 01/11/13 2103 01/12/13 0734 01/12/13 1147  GLUCAP 156* 176* 194* 161* 172*    Recent Results (from the past 240 hour(s))  SURGICAL PCR SCREEN     Status: None   Collection Time    01/06/13  5:56 PM      Result Value Range Status   MRSA, PCR NEGATIVE  NEGATIVE Final   Staphylococcus aureus NEGATIVE  NEGATIVE Final   Comment:            The Xpert SA Assay (FDA     approved for NASAL specimens     in patients over 2 years of age),     is one component of     a comprehensive surveillance     program.  Test performance has     been validated by [employer] for patients greater     than or equal to 83 year old.     It is not intended     to diagnose infection nor to     guide or monitor treatment.  ANAEROBIC CULTURE     Status: None   Collection Time    01/06/13  7:58 PM      Result Value Range Status   Specimen Description ARM LEFT   Final   Special Requests NONE   Final   Gram Stain     Final   Value: MODERATE WBC PRESENT,BOTH PMN AND MONONUCLEAR     NO SQUAMOUS EPITHELIAL CELLS SEEN     NO ORGANISMS SEEN   Culture NO ANAEROBES ISOLATED   Final   Report Status 01/11/2013 FINAL   Final  WOUND CULTURE     Status: None   Collection Time    01/06/13  7:58 PM      Result Value Range Status   Specimen Description ARM LEFT   Final   Special Requests NONE   Final   Gram Stain     Final   Value: RARE WBC PRESENT, PREDOMINANTLY PMN     NO SQUAMOUS EPITHELIAL CELLS SEEN     NO ORGANISMS SEEN   Culture NO GROWTH 2 DAYS   Final   Report Status 01/09/2013 FINAL   Final  WOUND CULTURE     Status: None   Collection Time    01/06/13  8:21 PM      Result Value Range Status   Specimen Description ARM FOREARM LEFT   Final   Special Requests NONE   Final   Gram Stain     Final   Value: FEW WBC PRESENT,BOTH PMN AND MONONUCLEAR     NO SQUAMOUS EPITHELIAL CELLS SEEN     RARE GRAM POSITIVE COCCI     IN PAIRS   Culture      Final  Value: FEW STAPHYLOCOCCUS AUREUS     Note: RIFAMPIN AND GENTAMICIN SHOULD NOT BE USED AS SINGLE DRUGS FOR TREATMENT OF STAPH INFECTIONS.   Report Status 01/09/2013 FINAL   Final   Organism ID, Bacteria STAPHYLOCOCCUS AUREUS   Final  ANAEROBIC CULTURE     Status: None   Collection Time    01/06/13  8:21 PM      Result Value Range Status   Specimen Description ARM FOREARM LEFT   Final   Special Requests NONE   Final   Gram Stain     Final   Value: RARE WBC PRESENT, PREDOMINANTLY PMN     NO SQUAMOUS EPITHELIAL CELLS SEEN     NO ORGANISMS SEEN   Culture NO ANAEROBES ISOLATED   Final   Report Status 01/11/2013 FINAL   Final     Studies: No results found.  Scheduled Meds: .  ceFAZolin (ANCEF) IV  2 g Intravenous Q8H  . diltiazem  180 mg Oral Daily  . ferrous sulfate  325 mg Oral TID PC  . insulin aspart  0-5 Units Subcutaneous QHS  . metoprolol tartrate  50 mg Oral BID  . polyethylene glycol  17 g Oral BID  . rivaroxaban  15 mg Oral BID WC   Followed by  . [START ON 01/31/2013] rivaroxaban  20 mg Oral Q supper  . sodium chloride  10-40 mL Intracatheter Q12H  . sodium chloride  3 mL Intravenous Q12H   Continuous Infusions: . 0.9 % NaCl with KCl 20 mEq / L 20 mL (01/12/13 1210)    Principal Problem:   Cellulitis Active Problems:   Hyponatremia   Hypokalemia   Fasting hyperglycemia   Leukocytosis   Hypotension   Diabetes mellitus type 2, noninsulin dependent   Hypertrophic cardiomyopathy   Atrial fibrillation   Acute blood loss anemia    Time spent: > 35 minutes    Penny Pia  [facility] Pager [phone]. If 7PM-7AM, please contact night-coverage at [email or web address], password [facility] 01/12/2013, 2:51 PM  LOS: 12 days

## 2013-01-12 NOTE — Progress Notes (Signed)
Subjective:  Not sleeping well, Went into atrial fibrillation again last night and was on diltiazem drip again and converted to NSR.  Is stressed over daughter.  Still with arm pain.. Mild SOB.  Subjective:  Vital Signs in the last 24 hours: BP 141/58  Pulse 67  Temp(Src) 98.1 F (36.7 C) (Oral)  Resp 18  Ht 5\' 4"  (1.626 m)  Wt 77.111 kg (170 lb)  BMI 29.17 kg/m2  SpO2 92%  Physical Exam: Obese white female in no acute distress Lungs:  Reduced breath sounds Cardiac:  Regular rhythm, normal S1 and S2, no S3,2/6 murmur noted when sitting up today Extremities:  Left arm bandaged  Intake/Output from previous day: 06/10 0701 - 06/11 0700 In: 1143.4 [P.O.:320; I.V.:618.4; IV Piggyback:205] Out: 4072.5 [Urine:4050; Drains:22.5]  Weight Filed Weights   01/07/13 0015 01/08/13 0400 01/10/13 2058  Weight: 81.3 kg (179 lb 3.7 oz) 84.8 kg (186 lb 15.2 oz) 77.111 kg (170 lb)    Lab Results: Basic Metabolic Panel:  Recent Labs  16/10/96 0500 01/12/13 0430  NA 132* 134*  K 4.1 3.9  CL 98 98  CO2 26 29  GLUCOSE 108* 172*  BUN 8 9  CREATININE 0.55 0.57    CBC:  Recent Labs  01/11/13 0500 01/12/13 0430  WBC 14.8* 19.0*  HGB 10.8* 10.1*  HCT 32.4* 31.2*  MCV 89.5 88.9  PLT 635* 628*   BNP    Component Value Date/Time   PROBNP 6103.0* 01/02/2013 0354    Telemetry: Sinus rhythm  ECHO Hypertrophic cardiomyopathy with mild SAM of anterior leaflet of MV, Moderate to severe LA enlargement  Assessment/Plan:  1. Hypertrophic cardiomyopathy with SAM of mitral valve.  Murmur is louder that may reflect sitting up and positional changes 3. Atrial fibrillation recurrent 4. Hyponatremia improved 5. DVT of left arm  Recommendations:  Add diltiazem to see if can reduce afib.  I think could discontinue PAS hose.Darden Palmer  MD [facility] Cardiology  01/12/2013, 1:36 PM

## 2013-01-12 NOTE — Progress Notes (Signed)
CARE MANAGEMENT NOTE 01/12/2013  Patient:  [name], [name]   Account Number:  [id number]  Date Initiated:  01/04/2013  Documentation initiated by:  Lanier Clam  Subjective/Objective Assessment:   ADMITTED LUE CELLULITIS.     Action/Plan:   FROM HOME W/FAMILY.HAS PCP,PHARMACY.   Anticipated DC Date:  01/10/2013   Anticipated DC Plan:  HOME/SELF CARE      DC Planning Services  CM consult      Choice offered to / List presented to:             Status of service:  In process, will continue to follow Medicare Important Message given?   (If response is "NO", the following Medicare IM given date fields will be blank) Date Medicare IM given:   Date Additional Medicare IM given:    Discharge Disposition:    Per UR Regulation:  Reviewed for med. necessity/level of care/duration of stay  If discussed at Long Length of Stay Meetings, dates discussed:    Comments:  [id number]/Kayla Gomer,RN,BSN,CCM: Reviewed the benefits and general side effects of xarelto with patient.  Offered to do assistance program with her but stated that she has just enrolled in the drug program through [place] and that it does cover xarelto.  Patient is planning to go to [facility] home for the next 4-6 weeks for continuation of IV ABX and wound care. [id number]/Kayla Earlene Plater, RN, BSN, CCM: CHART REVIEWED AND UPDATEDPatient to or pm of [id number] debridement of abcesses and area of cellulitis, hypotensive post op,sepsis, and dvt.  Next chart review due on [id number]. NO DISCHARGE NEEDS PRESENT AT THIS TIME. CASE MANAGEMENT [phone]   01/04/13 KATHY MAHABIR RN,BSN NCM [phone] TRANSFER FROM SDU.?ABSCESS,DOPPLER,SCAN.

## 2013-01-12 NOTE — Progress Notes (Signed)
Patient's heart rate has been sustaining at 110's-120's, atrial fib. No complaints of chest pain, no shortness of breath. Dr. Mayford Knife was made aware. Cardizem 10mg  IV bolus given, and cardizem drip started at 5mg /hr. BP has been stable. Will continue to monitor patient.

## 2013-01-12 NOTE — Progress Notes (Signed)
OT Cancellation Note  Patient Details Name: [name] MRN: [id number] DOB: 21-Aug-1945   Cancelled Treatment:    Reason Eval/Treat Not Completed: Other (comment)  Noted pt did not want to participate with PT earlier this afternoon due to poor sleep and pain just under control. Will check back.    Tamme Mozingo 01/12/2013, 3:44 PM Marica Otter, OTR/L [phone] 01/12/2013

## 2013-01-12 NOTE — Progress Notes (Signed)
Subjective: 3 Days Post-Op Procedure(s) (LRB): REDO INCISION AND DRAINAGE LEFT ELBOW (Left) SECONDARY CLOSURE OF WOUND  LEFT ELBOW (Left) APPLICATION OF WOUND VAC (Left) Last night I had severe burning pain in my left hand and lower forearm. No fever or chills. Pain may be related to drain pressure on ulnar n or due to separation of the skin and subcut tissue Off the muscles causing local neurogenic pain. WBC up today, concerning for change in the trajectory expected as  Her WBC had decreased steadily the last 2 days now increased from 14K to 19 K.  Patient reports pain as 7 on 0-10 scale.    Objective: Vital signs in last 24 hours: Temp:  [97.7 F (36.5 C)-98.5 F (36.9 C)] 98.5 F (36.9 C) (06/11 1426) Pulse Rate:  [62-115] 62 (06/11 1426) Resp:  [17-19] 19 (06/11 1426) BP: (128-160)/(58-71) 160/67 mmHg (06/11 1426) SpO2:  [92 %-96 %] 96 % (06/11 1426)  Intake/Output from previous day: 06/10 0701 - 06/11 0700 In: 1143.4 [P.O.:320; I.V.:618.4; IV Piggyback:205] Out: 4072.5 [Urine:4050; Drains:22.5] Intake/Output this shift:     Recent Labs  01/10/13 0345 01/11/13 0500 01/12/13 0430  HGB 9.4* 10.8* 10.1*    Recent Labs  01/11/13 0500 01/12/13 0430  WBC 14.8* 19.0*  RBC 3.62* 3.51*  HCT 32.4* 31.2*  PLT 635* 628*    Recent Labs  01/11/13 0500 01/12/13 0430  NA 132* 134*  K 4.1 3.9  CL 98 98  CO2 26 29  BUN 8 9  CREATININE 0.55 0.57  GLUCOSE 108* 172*  CALCIUM 8.6 8.5   No results found for this basename: LABPT, INR,  in the last 72 hours  ABD soft Intact pulses distally Incision: moderate drainage, bloody drainage at posterior incision site, drains less than 8 cc each so the anterior drain was removed and the single posterior drain left in place. No cellulitis present Compartments soft  Assessment/Plan: 3 Days Post-Op Procedure(s) (LRB): REDO INCISION AND DRAINAGE LEFT ELBOW (Left) SECONDARY CLOSURE OF WOUND  LEFT ELBOW (Left) APPLICATION OF  WOUND VAC (Left)   Advance diet Up with therapy Will see in the am and consider removing the posterior drain if the total drainage remains low. Recheck CBC in am to assess trajectory of WBC.  Koty Anctil E 01/12/2013, 7:52 PM

## 2013-01-13 LAB — GLUCOSE, CAPILLARY
Glucose-Capillary: 178 mg/dL — ABNORMAL HIGH (ref 70–99)
Glucose-Capillary: 158 mg/dL — ABNORMAL HIGH (ref 70–99)

## 2013-01-13 LAB — CBC WITH DIFFERENTIAL/PLATELET
Basophils Relative: 1 % (ref 0–1)
HCT: 30.6 % — ABNORMAL LOW (ref 36.0–46.0)
Hemoglobin: 10.2 g/dL — ABNORMAL LOW (ref 12.0–15.0)
Lymphs Abs: 1.6 10*3/uL (ref 0.7–4.0)
MCH: 29.7 pg (ref 26.0–34.0)
MCHC: 33.3 g/dL (ref 30.0–36.0)
Monocytes Absolute: 0.9 10*3/uL (ref 0.1–1.0)
Monocytes Relative: 7 % (ref 3–12)
Neutro Abs: 9.6 10*3/uL — ABNORMAL HIGH (ref 1.7–7.7)
Neutrophils Relative %: 77 % (ref 43–77)
RBC: 3.43 MIL/uL — ABNORMAL LOW (ref 3.87–5.11)

## 2013-01-13 NOTE — Progress Notes (Signed)
Occupational Therapy Treatment Patient Details Name: [name] MRN: [id number] DOB: 03/02/1946 Today's Date: 01/13/2013 Time: 4098-1191 OT Time Calculation (min): 29 min  OT Assessment / Plan / Recommendation Comments on Treatment Session much improved!                   Plan Discharge plan remains appropriate    Precautions / Restrictions Precautions Precautions: Fall Other Brace/Splint: LUE splint/sling Restrictions Weight Bearing Restrictions: No       ADL  Grooming: Performed;Wash/dry hands;Wash/dry face;Teeth care;Minimal assistance Where Assessed - Grooming: Supported sitting Toilet Transfer: Simulated;Minimal assistance Toilet Transfer Method: Sit to stand (bed to chair) Transfers/Ambulation Related to ADLs: Pt much improved.  Pt states she is going to SNF for rehab.   ADL Comments: Pt able to move L fingers and shoulder this OT visit. Pt peformed 10 reps of each exercise and did well (finger flexion, finger extension, and shoulder elevation)      OT Goals ADL Goals ADL Goal: Grooming - Progress: Progressing toward goals ADL Goal: Toilet Transfer - Progress: Progressing toward goals ADL Goal: Additional Goal #1 - Progress: Progressing toward goals  Visit Information  Last OT Received On: 01/13/13    Subjective Data  Subjective: I feel so so much better today      Cognition  Cognition Arousal/Alertness: Awake/alert Behavior During Therapy: WFL for tasks assessed/performed Overall Cognitive Status: Within Functional Limits for tasks assessed    Mobility  Bed Mobility Bed Mobility: Supine to Sit Supine to Sit: 4: Min assist;HOB elevated;With rails Sitting - Scoot to Edge of Bed: 4: Min guard Transfers Transfers: Sit to Stand;Stand to Sit Sit to Stand: 4: Min assist;From bed Stand to Sit: 4: Min assist;To chair/3-in-1          End of Session OT - End of Session Activity Tolerance: Patient tolerated treatment well Patient left: in  chair;with call bell/phone within reach  GO     Jawon Dipiero, [place] Kung 01/13/2013, 10:19 AM

## 2013-01-13 NOTE — Progress Notes (Signed)
Clinical Social Work  Patient was discussed during progression meeting. Per RN, patient not ready to DC. CSW called [facility] and updated facility on patient's DC plans. SNF agreeable to admission when medically stable. CSW will continue to follow.  Unk Lightning, LCSW (Coverage for [employer])

## 2013-01-13 NOTE — Progress Notes (Signed)
Subjective:  Feels much better today.  Less pain.  Not SOB.  No recurrence of atrial flutter or fib since yesterday.   Subjective:  Vital Signs in the last 24 hours: BP 142/48  Pulse 66  Temp(Src) 98.3 F (36.8 C) (Oral)  Resp 18  Ht 5\' 4"  (1.626 m)  Wt 77.111 kg (170 lb)  BMI 29.17 kg/m2  SpO2 93%  Physical Exam: Obese white female in no acute distress Lungs:  Reduced breath sounds Cardiac:  Regular rhythm, normal S1 and S2, no S3,2/6 no murmur lying down today. Extremities:  Left arm bandaged  Intake/Output from previous day: 06/11 0701 - 06/12 0700 In: 816 [P.O.:480; I.V.:320] Out: 2157.5 [Urine:2150; Drains:7.5]  Weight Filed Weights   01/07/13 0015 01/08/13 0400 01/10/13 2058  Weight: 81.3 kg (179 lb 3.7 oz) 84.8 kg (186 lb 15.2 oz) 77.111 kg (170 lb)    Lab Results: Basic Metabolic Panel:  Recent Labs  14/78/29 0500 01/12/13 0430  NA 132* 134*  K 4.1 3.9  CL 98 98  CO2 26 29  GLUCOSE 108* 172*  BUN 8 9  CREATININE 0.55 0.57    CBC:  Recent Labs  01/11/13 0500 01/12/13 0430  WBC 14.8* 19.0*  HGB 10.8* 10.1*  HCT 32.4* 31.2*  MCV 89.5 88.9  PLT 635* 628*   BNP    Component Value Date/Time   PROBNP 6103.0* 01/02/2013 0354    Telemetry: Sinus rhythm overnight  ECHO Hypertrophic cardiomyopathy with mild SAM of anterior leaflet of MV, Moderate to severe LA enlargement  Assessment/Plan:  1. Hypertrophic cardiomyopathy with SAM of mitral valve.   2. Atrial fibrillation now back in NSR 3. Hyponatremia improved 4. DVT of left arm   Recommendations:  Currently in NSR and on Xarelto for DVT.  Stable from cardiac viewpoint.  Darden Palmer  MD [facility] Cardiology  01/13/2013, 8:46 AM

## 2013-01-13 NOTE — Progress Notes (Signed)
[facility] PROGRESS NOTE  [name] VWU:[id number] DOB: 08-12-1945 DOA: 12/31/2012 PCP: Kayla Bussing, MD  Assessment/Plan:  Cellulitis/left olecranon bursitis - patient initially failed outpatient therapy. MRI done here in the hospital showed left infected olecranon bursitis and suspect myofasciitis. Orthopedic surgery have been consulted, patient is now status post excisional debridement of the left elbow and forearm on 6/5 and closure with drains 6/8. Wound culture from the initial surgery showed MSSA. Infectious disease have been consulted, and recommended cefazolin to be continued for 4 weeks starting 6/6, scheduled to be done on 02/04/2013. ID will arrange for followup in the clinic.  - Following the initial surgery on 6/5, patient was persistently hypotensive, requiring to be in the step down unit up until 6/9.  - She had pretty significant leukocytosis up to 29,000, now improving. Patient is currently on ancef - Ortho has cleared at this point.  Plan will be to transition to SNF once bed available.  Left arm DVT - on Xarelto started on 6/4   Atrial flutter -  - cardiology on board and managing.  - Cardizem added - Cardiology has cleared patient for discharge on cardizem, lopressor, and xarelto  Hyponatremia  - TSH borderline, normal free T4; cortisol appropriately elevated. Improving currently.  Constipation - resolved.  DM II  - continue SSI and diabetic diet.  Blood sugars have been relatively well controlled.  ABLA  - monitor and transfuse as needed. Stable now.  Code Status: full Family Communication: Discussed with patient and family  Disposition Plan: Once cleared by cardiology and Ortho will plan on discharging patient to SNF to complete IV antibiotic therapy as per recommendations by ID   Consultants:  Cardiology  Ortho  ID  Procedures:  I and D  Antibiotics:  cefazolin  HPI/Subjective: No new complaints. No acute issues overnight.     Objective: Filed Vitals:   01/12/13 1426 01/12/13 2153 01/13/13 0615 01/13/13 1300  BP: 160/67 162/82 142/48 148/52  Pulse: 62 77 66 65  Temp: 98.5 F (36.9 C) 98.1 F (36.7 C) 98.3 F (36.8 C) 98.1 F (36.7 C)  TempSrc: Oral Oral Oral Oral  Resp: 19 18 18 18   Height:      Weight:      SpO2: 96% 95% 93% 95%    Intake/Output Summary (Last 24 hours) at 01/13/13 1513 Last data filed at 01/13/13 1300  Gross per 24 hour  Intake    806 ml  Output 2357.5 ml  Net -1551.5 ml   Filed Weights   01/07/13 0015 01/08/13 0400 01/10/13 2058  Weight: 81.3 kg (179 lb 3.7 oz) 84.8 kg (186 lb 15.2 oz) 77.111 kg (170 lb)    Exam:   General:  Pt in NAD, Alert and Awake  Cardiovascular: irregularly irregular, no murmurs  Respiratory: cta bl, no wheezes  Abdomen: soft, NT, ND  Musculoskeletal: left arm in ace bandages.    Data Reviewed: Basic Metabolic Panel:  Recent Labs Lab 01/08/13 0630 01/09/13 0342 01/10/13 0345 01/11/13 0500 01/12/13 0430  NA 129* 128* 132* 132* 134*  K 4.3 4.2 4.5 4.1 3.9  CL 98 97 100 98 98  CO2 24 25 26 26 29   GLUCOSE 140* 198* 136* 108* 172*  BUN 10 12 9 8 9   CREATININE 0.64 0.75 0.63 0.55 0.57  CALCIUM 7.6* 7.7* 7.9* 8.6 8.5   Liver Function Tests: No results found for this basename: AST, ALT, ALKPHOS, BILITOT, PROT, ALBUMIN,  in the last 168 hours No results found for  this basename: LIPASE, AMYLASE,  in the last 168 hours No results found for this basename: AMMONIA,  in the last 168 hours CBC:  Recent Labs Lab 01/09/13 0342 01/10/13 0345 01/11/13 0500 01/12/13 0430 01/13/13 0900  WBC 19.8* 16.0* 14.8* 19.0* 12.4*  NEUTROABS 16.1*  --   --   --  9.6*  HGB 10.1* 9.4* 10.8* 10.1* 10.2*  HCT 29.5* 28.6* 32.4* 31.2* 30.6*  MCV 87.8 88.5 89.5 88.9 89.2  PLT 386 409* 635* 628* 673*   Cardiac Enzymes: No results found for this basename: CKTOTAL, CKMB, CKMBINDEX, TROPONINI,  in the last 168 hours BNP (last 3 results)  Recent Labs   01/02/13 0354  PROBNP 6103.0*   CBG:  Recent Labs Lab 01/12/13 1147 01/12/13 1657 01/12/13 2156 01/13/13 0738 01/13/13 1122  GLUCAP 172* 152* 147* 170* 178*    Recent Results (from the past 240 hour(s))  SURGICAL PCR SCREEN     Status: None   Collection Time    01/06/13  5:56 PM      Result Value Range Status   MRSA, PCR NEGATIVE  NEGATIVE Final   Staphylococcus aureus NEGATIVE  NEGATIVE Final   Comment:            The Xpert SA Assay (FDA     approved for NASAL specimens     in patients over 27 years of age),     is one component of     a comprehensive surveillance     program.  Test performance has     been validated by [employer] for patients greater     than or equal to 93 year old.     It is not intended     to diagnose infection nor to     guide or monitor treatment.  ANAEROBIC CULTURE     Status: None   Collection Time    01/06/13  7:58 PM      Result Value Range Status   Specimen Description ARM LEFT   Final   Special Requests NONE   Final   Gram Stain     Final   Value: MODERATE WBC PRESENT,BOTH PMN AND MONONUCLEAR     NO SQUAMOUS EPITHELIAL CELLS SEEN     NO ORGANISMS SEEN   Culture NO ANAEROBES ISOLATED   Final   Report Status 01/11/2013 FINAL   Final  WOUND CULTURE     Status: None   Collection Time    01/06/13  7:58 PM      Result Value Range Status   Specimen Description ARM LEFT   Final   Special Requests NONE   Final   Gram Stain     Final   Value: RARE WBC PRESENT, PREDOMINANTLY PMN     NO SQUAMOUS EPITHELIAL CELLS SEEN     NO ORGANISMS SEEN   Culture NO GROWTH 2 DAYS   Final   Report Status 01/09/2013 FINAL   Final  WOUND CULTURE     Status: None   Collection Time    01/06/13  8:21 PM      Result Value Range Status   Specimen Description ARM FOREARM LEFT   Final   Special Requests NONE   Final   Gram Stain     Final   Value: FEW WBC PRESENT,BOTH PMN AND MONONUCLEAR     NO SQUAMOUS EPITHELIAL CELLS SEEN     RARE GRAM POSITIVE  COCCI     IN PAIRS  Culture     Final   Value: FEW STAPHYLOCOCCUS AUREUS     Note: RIFAMPIN AND GENTAMICIN SHOULD NOT BE USED AS SINGLE DRUGS FOR TREATMENT OF STAPH INFECTIONS.   Report Status 01/09/2013 FINAL   Final   Organism ID, Bacteria STAPHYLOCOCCUS AUREUS   Final  ANAEROBIC CULTURE     Status: None   Collection Time    01/06/13  8:21 PM      Result Value Range Status   Specimen Description ARM FOREARM LEFT   Final   Special Requests NONE   Final   Gram Stain     Final   Value: RARE WBC PRESENT, PREDOMINANTLY PMN     NO SQUAMOUS EPITHELIAL CELLS SEEN     NO ORGANISMS SEEN   Culture NO ANAEROBES ISOLATED   Final   Report Status 01/11/2013 FINAL   Final     Studies: No results found.  Scheduled Meds: .  ceFAZolin (ANCEF) IV  2 g Intravenous Q8H  . diltiazem  180 mg Oral Daily  . ferrous sulfate  325 mg Oral TID PC  . insulin aspart  0-5 Units Subcutaneous QHS  . metoprolol tartrate  50 mg Oral BID  . polyethylene glycol  17 g Oral BID  . rivaroxaban  15 mg Oral BID WC   Followed by  . [START ON 01/31/2013] rivaroxaban  20 mg Oral Q supper  . sodium chloride  10-40 mL Intracatheter Q12H  . sodium chloride  3 mL Intravenous Q12H   Continuous Infusions: . 0.9 % NaCl with KCl 20 mEq / L 20 mL (01/12/13 1210)    Principal Problem:   Cellulitis Active Problems:   Hyponatremia   Hypokalemia   Fasting hyperglycemia   Leukocytosis   Hypotension   Diabetes mellitus type 2, noninsulin dependent   Hypertrophic cardiomyopathy   Atrial fibrillation   Acute blood loss anemia    Time spent: > 35 minutes    Penny Pia  [facility] Pager [phone]. If 7PM-7AM, please contact night-coverage at [email or web address], password [facility] 01/13/2013, 3:13 PM  LOS: 13 days

## 2013-01-13 NOTE — Progress Notes (Addendum)
Subjective: 4 Days Post-Op Procedure(s) (LRB): REDO INCISION AND DRAINAGE LEFT ELBOW (Left) SECONDARY CLOSURE OF WOUND  LEFT ELBOW (Left) APPLICATION OF WOUND VAC (Left) Patient reports pain as 3 on 0-10 scale.    Objective: Vital signs in last 24 hours: Temp:  [98.1 F (36.7 C)-98.5 F (36.9 C)] 98.3 F (36.8 C) (06/12 0615) Pulse Rate:  [62-77] 66 (06/12 0615) Resp:  [18-19] 18 (06/12 0615) BP: (142-162)/(48-82) 142/48 mmHg (06/12 0615) SpO2:  [93 %-96 %] 93 % (06/12 0615)  Intake/Output from previous day: 06/11 0701 - 06/12 0700 In: 816 [P.O.:480; I.V.:320] Out: 2157.5 [Urine:2150; Drains:7.5] Intake/Output this shift: Total I/O In: 240 [P.O.:240] Out: 800 [Urine:800]   Recent Labs  01/11/13 0500 01/12/13 0430 01/13/13 0900  HGB 10.8* 10.1* 10.2*    Recent Labs  01/12/13 0430 01/13/13 0900  WBC 19.0* 12.4*  RBC 3.51* 3.43*  HCT 31.2* 30.6*  PLT 628* 673*    Recent Labs  01/11/13 0500 01/12/13 0430  NA 132* 134*  K 4.1 3.9  CL 98 98  CO2 26 29  BUN 8 9  CREATININE 0.55 0.57  GLUCOSE 108* 172*  CALCIUM 8.6 8.5   No results found for this basename: LABPT, INR,  in the last 72 hours  ABD soft Incision: scant drainage and JP with very little output, drain d/ced. Need to recheck CBC. No cellulitis present Compartment soft  Assessment/Plan: 4 Days Post-Op Procedure(s) (LRB): REDO INCISION AND DRAINAGE LEFT ELBOW (Left) SECONDARY CLOSURE OF WOUND  LEFT ELBOW (Left) APPLICATION OF WOUND VAC (Left)  Advance diet Up with therapy D/C IV fluids Continue ABX therapy due to Culture taken of surgical site during incision drainage and debridement 01/06/2013 Re check CBC with diff, CRP today.  Addendum: CBC is improving, CRP is pending but I think she is very stable orthopaedically. Will need a return visit in one week to reassess as outpatient. Expect left UE sling and splint for 2 weeks then OT To perform ROM  NITKA,JAMES E 01/13/2013, 1:10 PM

## 2013-01-14 LAB — BASIC METABOLIC PANEL WITH GFR
BUN: 13 mg/dL (ref 6–23)
CO2: 29 meq/L (ref 19–32)
Calcium: 7.9 mg/dL — ABNORMAL LOW (ref 8.4–10.5)
Chloride: 99 meq/L (ref 96–112)
Creatinine, Ser: 0.62 mg/dL (ref 0.50–1.10)
GFR calc Af Amer: >90 mL/min
GFR calc non Af Amer: >90 mL/min
Glucose, Bld: 203 mg/dL — ABNORMAL HIGH (ref 70–99)
Potassium: 3.8 meq/L (ref 3.5–5.1)
Sodium: 134 meq/L — ABNORMAL LOW (ref 135–145)

## 2013-01-14 LAB — GLUCOSE, CAPILLARY
Glucose-Capillary: 165 mg/dL — ABNORMAL HIGH (ref 70–99)
Glucose-Capillary: 176 mg/dL — ABNORMAL HIGH (ref 70–99)
Glucose-Capillary: 169 mg/dL — ABNORMAL HIGH (ref 70–99)
Glucose-Capillary: 173 mg/dL — ABNORMAL HIGH (ref 70–99)

## 2013-01-14 MED ORDER — MAGIC MOUTHWASH
5.0000 mL | Freq: Four times a day (QID) | ORAL | Status: DC
  Administered 2013-01-14 – 2013-01-16 (×7): 5 mL via ORAL
  Filled 2013-01-14 (×15): qty 5

## 2013-01-14 MED ORDER — AMIODARONE HCL 200 MG PO TABS
400.0000 mg | ORAL_TABLET | Freq: Every day | ORAL | Status: DC
  Administered 2013-01-14 – 2013-01-17 (×4): 400 mg via ORAL
  Filled 2013-01-14 (×4): qty 2

## 2013-01-14 MED ORDER — SODIUM CHLORIDE 0.9 % IV BOLUS (SEPSIS)
250.0000 mL | Freq: Once | INTRAVENOUS | Status: AC
  Administered 2013-01-14: 250 mL via INTRAVENOUS

## 2013-01-14 NOTE — Progress Notes (Signed)
Inpatient Diabetes Program Recommendations  AACE/ADA: New Consensus Statement on Inpatient Glycemic Control (2013)  Target Ranges:  Prepandial:   less than 140 mg/dL      Peak postprandial:   less than 180 mg/dL (1-2 hours)      Critically ill patients:  140 - 180 mg/dL   Reason for Visit: Hyperglycemia Results for [name], [name] (MRN [id number]) as of 01/14/2013 13:44  Ref. Range 01/13/2013 11:22 01/13/2013 16:41 01/13/2013 21:25 01/14/2013 07:07 01/14/2013 11:45  Glucose-Capillary Latest Range: 70-99 mg/dL 119 (H) 147 (H) 829 (H) 165 (H) 176 (H)   Needs correction insulin.  Only HS is ordered.  Recommendation:  Add Novolog moderate tidwc.  Will continue to follow.  Blood sugars much improved from admission.  Thank you. Ailene Ards, RD, LDN, CDE Inpatient Diabetes Coordinator  [phone]

## 2013-01-14 NOTE — Progress Notes (Signed)
[facility] PROGRESS NOTE  [name] ZOX:[id number] DOB: July 18, 1946 DOA: 12/31/2012 PCP: [name], MD  Assessment/Plan:  Cellulitis/left olecranon bursitis - patient initially failed outpatient therapy. MRI done here in the hospital showed left infected olecranon bursitis and suspect myofasciitis. Orthopedic surgery have been consulted, patient is now status post excisional debridement of the left elbow and forearm on 6/5 and closure with drains 6/8. Wound culture from the initial surgery showed MSSA. Infectious disease have been consulted, and recommended cefazolin to be continued for 6 weeks with day one starting from 01/10/13. ID will arrange for followup in the clinic.   - Following the initial surgery on 6/5, patient was persistently hypotensive, requiring to be in the step down unit up until 6/9.   - She had pretty significant leukocytosis up to 29,000, now improving. Patient is currently on cefazolin  - Ortho has cleared at this point.  Plan will be to transition to SNF once bed available.  Left arm DVT - on Xarelto started on 6/4   Atrial flutter -  - cardiology on board and managing.  - Amiodarone and Metoprolol on board - Cardiology recommends further monitoring given recent bout of symptomatic afib requiring fluid bolus.   Hyponatremia  - TSH borderline, normal free T4; cortisol appropriately elevated. Improving currently.  Constipation - resolved.  DM II  - continue SSI and diabetic diet.  Blood sugars have been relatively well controlled.  ABLA  - monitor and transfuse as needed. Stable now. - recheck next am.  Code Status: full Family Communication: Discussed with patient Disposition Plan: Once atrial fibrillation better controlled.   Consultants:  Cardiology  Ortho  ID  Procedures:  I and D  Antibiotics:  cefazolin  HPI/Subjective: No new complaints. No acute issues overnight.    Objective: Filed Vitals:   01/14/13 0140  01/14/13 0346 01/14/13 0532 01/14/13 1326  BP: 99/60 102/52 152/64 159/84  Pulse:   71 64  Temp:   99 F (37.2 C) 98.3 F (36.8 C)  TempSrc:   Oral Oral  Resp:   18 18  Height:      Weight:      SpO2:   93% 96%    Intake/Output Summary (Last 24 hours) at 01/14/13 1356 Last data filed at 01/14/13 1326  Gross per 24 hour  Intake   1210 ml  Output   1950 ml  Net   -740 ml   Filed Weights   01/07/13 0015 01/08/13 0400 01/10/13 2058  Weight: 81.3 kg (179 lb 3.7 oz) 84.8 kg (186 lb 15.2 oz) 77.111 kg (170 lb)    Exam:   General:  Pt in NAD, Alert and Awake  Cardiovascular: irregularly irregular, no murmurs  Respiratory: cta bl, no wheezes  Abdomen: soft, NT, ND  Musculoskeletal: left arm in ace bandages.    Data Reviewed: Basic Metabolic Panel:  Recent Labs Lab 01/09/13 0342 01/10/13 0345 01/11/13 0500 01/12/13 0430 01/14/13 0930  NA 128* 132* 132* 134* 134*  K 4.2 4.5 4.1 3.9 3.8  CL 97 100 98 98 99  CO2 25 26 26 29 29   GLUCOSE 198* 136* 108* 172* 203*  BUN 12 9 8 9 13   CREATININE 0.75 0.63 0.55 0.57 0.62  CALCIUM 7.7* 7.9* 8.6 8.5 7.9*   Liver Function Tests: No results found for this basename: AST, ALT, ALKPHOS, BILITOT, PROT, ALBUMIN,  in the last 168 hours No results found for this basename: LIPASE, AMYLASE,  in the last 168 hours No results found  for this basename: AMMONIA,  in the last 168 hours CBC:  Recent Labs Lab 01/09/13 0342 01/10/13 0345 01/11/13 0500 01/12/13 0430 01/13/13 0900  WBC 19.8* 16.0* 14.8* 19.0* 12.4*  NEUTROABS 16.1*  --   --   --  9.6*  HGB 10.1* 9.4* 10.8* 10.1* 10.2*  HCT 29.5* 28.6* 32.4* 31.2* 30.6*  MCV 87.8 88.5 89.5 88.9 89.2  PLT 386 409* 635* 628* 673*   Cardiac Enzymes: No results found for this basename: CKTOTAL, CKMB, CKMBINDEX, TROPONINI,  in the last 168 hours BNP (last 3 results)  Recent Labs  01/02/13 0354  PROBNP 6103.0*   CBG:  Recent Labs Lab 01/13/13 1122 01/13/13 1641 01/13/13 2125  01/14/13 0707 01/14/13 1145  GLUCAP 178* 158* 145* 165* 176*    Recent Results (from the past 240 hour(s))  SURGICAL PCR SCREEN     Status: None   Collection Time    01/06/13  5:56 PM      Result Value Range Status   MRSA, PCR NEGATIVE  NEGATIVE Final   Staphylococcus aureus NEGATIVE  NEGATIVE Final   Comment:            The Xpert SA Assay (FDA     approved for NASAL specimens     in patients over 59 years of age),     is one component of     a comprehensive surveillance     program.  Test performance has     been validated by [employer] for patients greater     than or equal to 54 year old.     It is not intended     to diagnose infection nor to     guide or monitor treatment.  ANAEROBIC CULTURE     Status: None   Collection Time    01/06/13  7:58 PM      Result Value Range Status   Specimen Description ARM LEFT   Final   Special Requests NONE   Final   Gram Stain     Final   Value: MODERATE WBC PRESENT,BOTH PMN AND MONONUCLEAR     NO SQUAMOUS EPITHELIAL CELLS SEEN     NO ORGANISMS SEEN   Culture NO ANAEROBES ISOLATED   Final   Report Status 01/11/2013 FINAL   Final  WOUND CULTURE     Status: None   Collection Time    01/06/13  7:58 PM      Result Value Range Status   Specimen Description ARM LEFT   Final   Special Requests NONE   Final   Gram Stain     Final   Value: RARE WBC PRESENT, PREDOMINANTLY PMN     NO SQUAMOUS EPITHELIAL CELLS SEEN     NO ORGANISMS SEEN   Culture NO GROWTH 2 DAYS   Final   Report Status 01/09/2013 FINAL   Final  WOUND CULTURE     Status: None   Collection Time    01/06/13  8:21 PM      Result Value Range Status   Specimen Description ARM FOREARM LEFT   Final   Special Requests NONE   Final   Gram Stain     Final   Value: FEW WBC PRESENT,BOTH PMN AND MONONUCLEAR     NO SQUAMOUS EPITHELIAL CELLS SEEN     RARE GRAM POSITIVE COCCI     IN PAIRS   Culture     Final   Value: FEW STAPHYLOCOCCUS AUREUS  Note: RIFAMPIN AND  GENTAMICIN SHOULD NOT BE USED AS SINGLE DRUGS FOR TREATMENT OF STAPH INFECTIONS.   Report Status 01/09/2013 FINAL   Final   Organism ID, Bacteria STAPHYLOCOCCUS AUREUS   Final  ANAEROBIC CULTURE     Status: None   Collection Time    01/06/13  8:21 PM      Result Value Range Status   Specimen Description ARM FOREARM LEFT   Final   Special Requests NONE   Final   Gram Stain     Final   Value: RARE WBC PRESENT, PREDOMINANTLY PMN     NO SQUAMOUS EPITHELIAL CELLS SEEN     NO ORGANISMS SEEN   Culture NO ANAEROBES ISOLATED   Final   Report Status 01/11/2013 FINAL   Final     Studies: No results found.  Scheduled Meds: . amiodarone  400 mg Oral Daily  .  ceFAZolin (ANCEF) IV  2 g Intravenous Q8H  . ferrous sulfate  325 mg Oral TID PC  . insulin aspart  0-5 Units Subcutaneous QHS  . magic mouthwash  5 mL Oral QID  . metoprolol tartrate  50 mg Oral BID  . polyethylene glycol  17 g Oral BID  . rivaroxaban  15 mg Oral BID WC   Followed by  . [START ON 01/31/2013] rivaroxaban  20 mg Oral Q supper  . sodium chloride  10-40 mL Intracatheter Q12H  . sodium chloride  3 mL Intravenous Q12H   Continuous Infusions: . 0.9 % NaCl with KCl 20 mEq / L 20 mL/hr at 01/14/13 1610    Principal Problem:   Cellulitis Active Problems:   Hyponatremia   Hypokalemia   Fasting hyperglycemia   Leukocytosis   Hypotension   Diabetes mellitus type 2, noninsulin dependent   Hypertrophic cardiomyopathy   Atrial fibrillation   Acute blood loss anemia    Time spent: > 35 minutes    Penny Pia  [facility] Pager [phone]. If 7PM-7AM, please contact night-coverage at [email or web address], password [facility] 01/14/2013, 1:56 PM  LOS: 14 days

## 2013-01-14 NOTE — Progress Notes (Signed)
Pt converted back into sinus rhythm with 1st degree heart block.  Pt resting at this time. Notified K.Kirby, NP on call.  No new orders. Will continue to monitor pt.

## 2013-01-14 NOTE — Progress Notes (Signed)
Patient ID: [name], female   DOB: 02/23/1946, 67 y.o.   MRN: [id number] Awake, alert and oriented x4. Episode of afib last evening now improved. Min elev. Temp 99.0 CRP 2.9 nice decreasing trend  6.7>4.6>2.9 with I&Ds and continued antibx. Will remain for the weekend due to cardiac concerns. Dressing is dry and intact.  Dx: POD#5 left arm repeat I&D and delayed primary closure. Plan: Will continue to follow. Keep splint for total 2-3 weeks then ROM. Dr. Ophelia Charter in going to follow up this weekend and I will check on Monday. [phone]

## 2013-01-14 NOTE — Progress Notes (Signed)
Subjective:  Had recurrent atrial fibrillation last night from 1 am to 5 am.  Some low BP associated with that and required fluid bolus.  Converted spontaneously at 5 am and feels better now. Felt bad with it.  No fever.  Not SOB at present.  Subjective:  Vital Signs in the last 24 hours: BP 152/64  Pulse 71  Temp(Src) 99 F (37.2 C) (Oral)  Resp 18  Ht 5\' 4"  (1.626 m)  Wt 77.111 kg (170 lb)  BMI 29.17 kg/m2  SpO2 93%  Physical Exam: Obese white female in no acute distress Lungs:  Reduced breath sounds Cardiac:  Regular rhythm, normal S1 and S2, no S3, 2/6 systolic murmur today. Extremities:  Left arm bandaged  Intake/Output from previous day: 06/12 0701 - 06/13 0700 In: 1410 [P.O.:840; I.V.:320; IV Piggyback:250] Out: 2150 [Urine:2150]  Weight Filed Weights   01/07/13 0015 01/08/13 0400 01/10/13 2058  Weight: 81.3 kg (179 lb 3.7 oz) 84.8 kg (186 lb 15.2 oz) 77.111 kg (170 lb)    Lab Results: Basic Metabolic Panel:  Recent Labs  96/04/54 0430  NA 134*  K 3.9  CL 98  CO2 29  GLUCOSE 172*  BUN 9  CREATININE 0.57    CBC:  Recent Labs  01/12/13 0430 01/13/13 0900  WBC 19.0* 12.4*  NEUTROABS  --  9.6*  HGB 10.1* 10.2*  HCT 31.2* 30.6*  MCV 88.9 89.2  PLT 628* 673*   BNP    Component Value Date/Time   PROBNP 6103.0* 01/02/2013 0354    Telemetry: Sinus rhythm overnight  ECHO Hypertrophic cardiomyopathy with mild SAM of anterior leaflet of MV, Moderate to severe LA enlargement  Assessment/Plan:  1. Hypertrophic cardiomyopathy with SAM of mitral valve.   2. Atrial fibrillation recurrent 3. Hyponatremia improved 4. DVT of left arm   Recommendations:  She has had several episodes of atrial fibrillation now that are symptomatic when she is in its. She is especially sensitive of the need for atrial systole because of her hypertrophic cardiomyopathy. Her last EKG QTC was 0.476 the somewhat limits other antiarrhythmics that we could use.  I would  suggest that she stay in the hospital and that we try amiodarone to suppress atrial fibrillation.  Darden Palmer  MD [facility]  01/14/2013, 8:50 AM

## 2013-01-14 NOTE — Progress Notes (Signed)
ANTICOAGULATION CONSULT NOTE - Follow-up  Pharmacy Consult for Xarelto Indication: DVT  No Known Allergies  Patient Measurements: Height: 5\' 4"  (162.6 cm) Weight: 170 lb (77.111 kg) IBW/kg (Calculated) : 54.7  Vital Signs: Temp: 99 F (37.2 C) (06/13 0532) Temp src: Oral (06/13 0532) BP: 152/64 mmHg (06/13 0532) Pulse Rate: 71 (06/13 0532)  Labs:  Recent Labs  01/12/13 0430 01/13/13 0900  HGB 10.1* 10.2*  HCT 31.2* 30.6*  PLT 628* 673*  CREATININE 0.57  --    Estimated Creatinine Clearance: 70.5 ml/min (by C-G formula based on Cr of 0.57).  Medical History: Past Medical History  Diagnosis Date  . Diabetes mellitus without complication     dx on wed  . Mitral valve prolapse   . Atrial flutter    Assessment: 67 yo F with new onset atrial fibrillation and LUE DVT noted on venous Doppler.  Patient was started on Lovenox for afib on 6/2, switch to Xarelto due to DVT.    Renal function stable, CrCl~70 ml/min.  (last checked 6/11)   H/H stable as of 6/12  Verapamil has been d/c'd (drug interaction)  Patient is going in and out of Afib. Per cards note, Kathryne Hitch is being used for DVT treatment. If treatment is desired for Afib also, Xarelto 20mg  with evening meal is appropriate dose, as long as CrCl remains >23ml/min (once the patient finishes 15mg  PO BID as part of the DVT treatment regimen)  Patient has had several interruptions in Xarelto administration due to various procedures. Patient received 1 of 2 doses on 6/4, 6/6, and 6/8 (only received 15mg  theses days, not 2 doses of 15mg ) and no Xarelto was given on 6/5 or 6/7. Because of this, will change the 21-day date range to 6/9-6/29, which is the 1st day the patient received both doses of Xarelto 15mg . On 6/30, patient is to transition to Xarelto 20mg  daily.  Goal of Therapy:  Treatment of DVT Monitor platelets by anticoagulation protocol: Yes   Plan:   Xarelto 15 mg PO twice daily with meals x 21 days (6/9-6/29),  then Xarelto 20 mg once daily with evening meal to start on 6/30.   Darrol Angel, PharmD Pager: [phone] 01/14/2013, 8:08 AM

## 2013-01-14 NOTE — Progress Notes (Signed)
Clinical Social Work  Per MD, patient is not medically stable to DC until early next week. CSW updated [facility] who is agreeable to admission next week. CSW will continue to follow.  Unk Lightning, LCSW (Coverage for [employer])

## 2013-01-14 NOTE — Progress Notes (Signed)
Physical Therapy Treatment Patient Details Name: [name] MRN: [id number] DOB: 12-Apr-1946 Today's Date: 01/14/2013 Time: 4098-1191 PT Time Calculation (min): 15 min  PT Assessment / Plan / Recommendation Comments on Treatment Session  Pt progressing well.  Pt able to ambulate in hallway with a few seated rest breaks due to dizziness however pt pleased with progress.    Follow Up Recommendations  Supervision - Intermittent;SNF (plan per chart for SNF now)     Does the patient have the potential to tolerate intense rehabilitation     Barriers to Discharge        Equipment Recommendations  None recommended by PT    Recommendations for Other Services    Frequency     Plan Frequency remains appropriate;Discharge plan needs to be updated    Precautions / Restrictions Precautions Precautions: Fall Required Braces or Orthoses: Other Brace/Splint Other Brace/Splint: LUE splint/sling Restrictions Weight Bearing Restrictions: No   Pertinent Vitals/Pain See below    Mobility  Bed Mobility Bed Mobility: Not assessed Transfers Transfers: Sit to Stand;Stand to Sit Sit to Stand: 4: Min guard;From chair/3-in-1;With upper extremity assist Stand to Sit: 4: Min guard;To chair/3-in-1;With upper extremity assist Details for Transfer Assistance: use of R UE to assist, min/guard for safety, therapist donned sling for ambulation (pt prefers not to wear at rest due to discomfort) Ambulation/Gait Ambulation/Gait Assistance: 4: Min assist Ambulation Distance (Feet): 160 Feet (total) Assistive device: Other (Comment) (IV pole) Ambulation/Gait Assistance Details: assist for steadying, pt pushed IV pole, pt reported dizziness so took seated rest breaks 40x1, 80x1, 40x1  upon second rest break vitals: 146/89 mmHg, 94% room air, and 69 bpm Gait Pattern: Step-to pattern;Narrow base of support    Exercises     PT Diagnosis:    PT Problem List:   PT Treatment Interventions:     PT  Goals Acute Rehab PT Goals PT Goal: Sit to Stand - Progress: Progressing toward goal PT Goal: Stand to Sit - Progress: Progressing toward goal PT Goal: Ambulate - Progress: Progressing toward goal  Visit Information  Last PT Received On: 01/14/13 Assistance Needed: +1    Subjective Data  Subjective: I haven't walked in the hallway in awhile.   Cognition  Cognition Arousal/Alertness: Awake/alert Behavior During Therapy: WFL for tasks assessed/performed Overall Cognitive Status: Within Functional Limits for tasks assessed    Balance     End of Session PT - End of Session Equipment Utilized During Treatment: Gait belt Activity Tolerance: Patient tolerated treatment well Patient left: in chair;with call bell/phone within reach   GP     Jessina Marse,KATHrine E 01/14/2013, 12:35 PM Zenovia Jarred, PT, DPT 01/14/2013 Pager: [phone]

## 2013-01-14 NOTE — Progress Notes (Signed)
Pt converted into A. Fib a little after 0100 am.  Pt complaining of not being able to sleep and being uncomfortable. Pt refused any pain medication. BP 99/60 on left leg HR in the 90's to low 100's.  Assisted pt to [facility] and pt c/o feeling lightheaded and dizzy when getting up out of bed.  Notified Donnamarie Poag, NP on call and received new orders to give 250 cc NS bolus.  Will administer and continue to monitor pt.

## 2013-01-14 NOTE — Progress Notes (Signed)
Continued support around hospitalization.  Pt's daughter discharged from hospital to home and pt is hoping to be able to get home and support her as soon as possible.  Spoke with chaplain about feeling of being trapped.  Continued support around heart condition, which pt's sister died of.  Will continue to follow during admission.   Belva Crome MDiv

## 2013-01-14 NOTE — Progress Notes (Signed)
Chaplain consulted with pt while rounding on floor.    Introduced spiritual care as resource.  Provided emotional and spiritual support around illness, difficulties in supporting daughter with bi-polar.    Daughter currently hospitalized at [facility].  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  Now, d/t multiple OD attempts, contemplating alternative living situations for daughter, who currently lives with pt. and 45 y/o grandson.   Pt grieving not being able to attend grandson's graduation this weekend.    Pt relies on prayer and faith for coping.  Shared prayers with chaplain.  Will continue to follow for support.   Please page as needs arise.    Belva Crome MDiv

## 2013-01-15 LAB — BASIC METABOLIC PANEL
BUN: 13 mg/dL (ref 6–23)
Calcium: 8.4 mg/dL (ref 8.4–10.5)
Creatinine, Ser: 0.58 mg/dL (ref 0.50–1.10)
GFR calc Af Amer: >90 mL/min (ref >90)
GFR calc non Af Amer: >90 mL/min (ref >90)

## 2013-01-15 LAB — CBC
HCT: 29.2 % — ABNORMAL LOW (ref 36.0–46.0)
MCHC: 31.8 g/dL (ref 30.0–36.0)
Platelets: 635 10*3/uL — ABNORMAL HIGH (ref 150–400)
RDW: 14.1 % (ref 11.5–15.5)
WBC: 11.6 10*3/uL — ABNORMAL HIGH (ref 4.0–10.5)

## 2013-01-15 LAB — GLUCOSE, CAPILLARY
Glucose-Capillary: 168 mg/dL — ABNORMAL HIGH (ref 70–99)
Glucose-Capillary: 160 mg/dL — ABNORMAL HIGH (ref 70–99)
Glucose-Capillary: 104 mg/dL — ABNORMAL HIGH (ref 70–99)

## 2013-01-15 MED ORDER — POTASSIUM CHLORIDE 20 MEQ/15ML (10%) PO LIQD
40.0000 meq | Freq: Once | ORAL | Status: AC
  Administered 2013-01-15: 40 meq via ORAL
  Filled 2013-01-15: qty 30

## 2013-01-15 MED ORDER — GLIPIZIDE 5 MG PO TABS
5.0000 mg | ORAL_TABLET | Freq: Every day | ORAL | Status: DC
  Administered 2013-01-15 – 2013-01-17 (×3): 5 mg via ORAL
  Filled 2013-01-15 (×3): qty 1

## 2013-01-15 MED ORDER — INSULIN ASPART 100 UNIT/ML ~~LOC~~ SOLN
0.0000 [IU] | Freq: Three times a day (TID) | SUBCUTANEOUS | Status: DC
  Administered 2013-01-16 – 2013-01-17 (×2): 3 [IU] via SUBCUTANEOUS
  Administered 2013-01-17: 2 [IU] via SUBCUTANEOUS

## 2013-01-15 MED ORDER — CLOTRIMAZOLE 1 % VA CREA
1.0000 | TOPICAL_CREAM | Freq: Every day | VAGINAL | Status: DC
  Administered 2013-01-15 – 2013-01-16 (×2): 1 via VAGINAL
  Filled 2013-01-15: qty 45

## 2013-01-15 NOTE — Progress Notes (Signed)
Subjective: 6 Days Post-Op Procedure(s) (LRB): REDO INCISION AND DRAINAGE LEFT ELBOW (Left) SECONDARY CLOSURE OF WOUND  LEFT ELBOW (Left) APPLICATION OF WOUND VAC (Left) Patient reports pain as mild.    Objective: Vital signs in last 24 hours: Temp:  [97.8 F (36.6 C)-98.3 F (36.8 C)] 97.9 F (36.6 C) (06/14 0635) Pulse Rate:  [64-78] 70 (06/14 0635) Resp:  [18-20] 20 (06/14 0635) BP: (129-161)/(53-84) 129/53 mmHg (06/14 0635) SpO2:  [93 %-96 %] 93 % (06/14 0635)  Intake/Output from previous day: 06/13 0701 - 06/14 0700 In: 1470 [P.O.:840; I.V.:630] Out: 1200 [Urine:1200] Intake/Output this shift: Total I/O In: 200 [IV Piggyback:200] Out: -    Recent Labs  01/13/13 0900 01/15/13 0500  HGB 10.2* 9.3*    Recent Labs  01/13/13 0900 01/15/13 0500  WBC 12.4* 11.6*  RBC 3.43* 3.26*  HCT 30.6* 29.2*  PLT 673* 635*    Recent Labs  01/14/13 0930 01/15/13 0500  NA 134* 135  K 3.8 3.3*  CL 99 99  CO2 29 32  BUN 13 13  CREATININE 0.62 0.58  GLUCOSE 203* 179*  CALCIUM 7.9* 8.4   No results found for this basename: LABPT, INR,  in the last 72 hours  Neurologically intact fingers good flexion and extension without pain. good sensation.   Assessment/Plan: 6 Days Post-Op Procedure(s) (LRB): REDO INCISION AND DRAINAGE LEFT ELBOW (Left) SECONDARY CLOSURE OF WOUND  LEFT ELBOW (Left) APPLICATION OF WOUND VAC (Left) Discharge to SNF  Rayya Yagi C 01/15/2013, 10:52 AM

## 2013-01-15 NOTE — Progress Notes (Signed)
Subjective:  Much better night, not SOB.  No chest pain.  Overall sense of wellbeing is better  Subjective:  Vital Signs in the last 24 hours: BP 129/53  Pulse 70  Temp(Src) 97.9 F (36.6 C) (Oral)  Resp 20  Ht 5\' 4"  (1.626 m)  Wt 77.111 kg (170 lb)  BMI 29.17 kg/m2  SpO2 93%  Physical Exam: Obese white female in no acute distress Lungs:  Reduced breath sounds Cardiac:  Regular rhythm, normal S1 and S2, no S3, 2/6 systolic murmur today. Extremities:  Left arm bandaged  Intake/Output from previous day: 06/13 0701 - 06/14 0700 In: 1215 [P.O.:720; I.V.:495] Out: 1200 [Urine:1200]  Weight Filed Weights   01/07/13 0015 01/08/13 0400 01/10/13 2058  Weight: 81.3 kg (179 lb 3.7 oz) 84.8 kg (186 lb 15.2 oz) 77.111 kg (170 lb)    Lab Results: Basic Metabolic Panel:  Recent Labs  16/10/96 0930 01/15/13 0500  NA 134* 135  K 3.8 3.3*  CL 99 99  CO2 29 32  GLUCOSE 203* 179*  BUN 13 13  CREATININE 0.62 0.58    CBC:  Recent Labs  01/13/13 0900 01/15/13 0500  WBC 12.4* 11.6*  NEUTROABS 9.6*  --   HGB 10.2* 9.3*  HCT 30.6* 29.2*  MCV 89.2 89.6  PLT 673* 635*   BNP    Component Value Date/Time   PROBNP 6103.0* 01/02/2013 0354    Telemetry: Sinus rhythm now with occasional PAC's  ECHO Hypertrophic cardiomyopathy with mild SAM of anterior leaflet of MV, Moderate to severe LA enlargement  Assessment/Plan:  1. Hypertrophic cardiomyopathy with SAM of mitral valve.   2. Atrial fibrillation recurrent currently on amiodarone 3. Hyponatremia improved 4. DVT of left arm  5. Hypokalemia  Recommendations:  Replete potassium.  Her EKG shows a QTC of .44.  I would continue amiodarone and watch over the weekend.  If stable could go to [place] home in am.   W. Ashley Royalty  MD [facility]  01/15/2013, 8:29 AM

## 2013-01-15 NOTE — Progress Notes (Addendum)
[facility] PROGRESS NOTE  [name] JXB:[id number] DOB: [birth year] DOA: 12/31/2012 PCP: [name], MD  Assessment/Plan:  Cellulitis/left olecranon bursitis - patient initially failed outpatient therapy. MRI done here in the hospital showed left infected olecranon bursitis and suspect myofasciitis. Orthopedic surgery have been consulted, patient is now status post excisional debridement of the left elbow and forearm on 6/5 and closure with drains 6/8. Wound culture from the initial surgery showed MSSA. Infectious disease have been consulted, and recommended cefazolin to be continued for 6 weeks with day one starting from 01/10/13. ID will arrange for followup in the clinic.   - Following the initial surgery on 6/5, patient was persistently hypotensive, requiring to be in the step down unit up until 6/9.   - She had pretty significant leukocytosis up to 29,000, now improving. Patient is currently on cefazolin  - Ortho has cleared at this point.  Plan will be to transition to SNF once bed available.  Left arm DVT - on Xarelto started on 6/4   Atrial flutter -  - cardiology on board and managing.  - Amiodarone and Metoprolol on board - Cardiology recommends further monitoring given recent bout of symptomatic afib. - Plan on transitioning to SNF Monday should heart rate stay controlled on above medications.   Hyponatremia  - Addendum: has resolved  Hypokalemia - At this point replaced orally. - reassess next am.  Constipation - resolved.  DM II  - Place on SSI and diabetic diet.  Blood sugars have been relatively well controlled. - hemoglobin a1c > 10 on last check on 01/01/13 - At this point am considering placing on oral agent while in the hospital as patient would prefer to take oral agent as opposed to insulin regimen. - Will start glipizide.  ABLA  - Resolved and hemoglobin stable.  Code Status: full Family Communication: Discussed with  patient Disposition Plan: Once atrial fibrillation better controlled.   Consultants:  Cardiology  Ortho  ID  Procedures:  I and D  Antibiotics:  cefazolin  HPI/Subjective: No new complaints. No acute issues overnight.    Objective: Filed Vitals:   01/14/13 1326 01/14/13 2115 01/14/13 2351 01/15/13 0635  BP: 159/84 161/67  129/53  Pulse: 64 75 78 70  Temp: 98.3 F (36.8 C) 97.8 F (36.6 C)  97.9 F (36.6 C)  TempSrc: Oral Oral  Oral  Resp: 18 20  20   Height:      Weight:      SpO2: 96% 95%  93%    Intake/Output Summary (Last 24 hours) at 01/15/13 1301 Last data filed at 01/15/13 0711  Gross per 24 hour  Intake   1430 ml  Output    600 ml  Net    830 ml   Filed Weights   01/07/13 0015 01/08/13 0400 01/10/13 2058  Weight: 81.3 kg (179 lb 3.7 oz) 84.8 kg (186 lb 15.2 oz) 77.111 kg (170 lb)    Exam:   General:  Pt in NAD, Alert and Awake  Cardiovascular: irregularly irregular, no murmurs  Respiratory: cta bl, no wheezes  Abdomen: soft, NT, ND  Musculoskeletal: left arm in ace bandages.    Data Reviewed: Basic Metabolic Panel:  Recent Labs Lab 01/10/13 0345 01/11/13 0500 01/12/13 0430 01/14/13 0930 01/15/13 0500  NA 132* 132* 134* 134* 135  K 4.5 4.1 3.9 3.8 3.3*  CL 100 98 98 99 99  CO2 26 26 29 29  32  GLUCOSE 136* 108* 172* 203* 179*  BUN 9 8  9 13 13   CREATININE 0.63 0.55 0.57 0.62 0.58  CALCIUM 7.9* 8.6 8.5 7.9* 8.4   Liver Function Tests: No results found for this basename: AST, ALT, ALKPHOS, BILITOT, PROT, ALBUMIN,  in the last 168 hours No results found for this basename: LIPASE, AMYLASE,  in the last 168 hours No results found for this basename: AMMONIA,  in the last 168 hours CBC:  Recent Labs Lab 01/09/13 0342 01/10/13 0345 01/11/13 0500 01/12/13 0430 01/13/13 0900 01/15/13 0500  WBC 19.8* 16.0* 14.8* 19.0* 12.4* 11.6*  NEUTROABS 16.1*  --   --   --  9.6*  --   HGB 10.1* 9.4* 10.8* 10.1* 10.2* 9.3*  HCT 29.5* 28.6*  32.4* 31.2* 30.6* 29.2*  MCV 87.8 88.5 89.5 88.9 89.2 89.6  PLT 386 409* 635* 628* 673* 635*   Cardiac Enzymes: No results found for this basename: CKTOTAL, CKMB, CKMBINDEX, TROPONINI,  in the last 168 hours BNP (last 3 results)  Recent Labs  01/02/13 0354  PROBNP 6103.0*   CBG:  Recent Labs Lab 01/14/13 0707 01/14/13 1145 01/14/13 1713 01/14/13 2123 01/15/13 0744  GLUCAP 165* 176* 169* 173* 168*    Recent Results (from the past 240 hour(s))  SURGICAL PCR SCREEN     Status: None   Collection Time    01/06/13  5:56 PM      Result Value Range Status   MRSA, PCR NEGATIVE  NEGATIVE Final   Staphylococcus aureus NEGATIVE  NEGATIVE Final   Comment:            The Xpert SA Assay (FDA     approved for NASAL specimens     in patients over 23 years of age),     is one component of     a comprehensive surveillance     program.  Test performance has     been validated by [employer] for patients greater     than or equal to 34 year old.     It is not intended     to diagnose infection nor to     guide or monitor treatment.  ANAEROBIC CULTURE     Status: None   Collection Time    01/06/13  7:58 PM      Result Value Range Status   Specimen Description ARM LEFT   Final   Special Requests NONE   Final   Gram Stain     Final   Value: MODERATE WBC PRESENT,BOTH PMN AND MONONUCLEAR     NO SQUAMOUS EPITHELIAL CELLS SEEN     NO ORGANISMS SEEN   Culture NO ANAEROBES ISOLATED   Final   Report Status 01/11/2013 FINAL   Final  WOUND CULTURE     Status: None   Collection Time    01/06/13  7:58 PM      Result Value Range Status   Specimen Description ARM LEFT   Final   Special Requests NONE   Final   Gram Stain     Final   Value: RARE WBC PRESENT, PREDOMINANTLY PMN     NO SQUAMOUS EPITHELIAL CELLS SEEN     NO ORGANISMS SEEN   Culture NO GROWTH 2 DAYS   Final   Report Status 01/09/2013 FINAL   Final  WOUND CULTURE     Status: None   Collection Time    01/06/13  8:21 PM       Result Value Range Status   Specimen Description ARM FOREARM  LEFT   Final   Special Requests NONE   Final   Gram Stain     Final   Value: FEW WBC PRESENT,BOTH PMN AND MONONUCLEAR     NO SQUAMOUS EPITHELIAL CELLS SEEN     RARE GRAM POSITIVE COCCI     IN PAIRS   Culture     Final   Value: FEW STAPHYLOCOCCUS AUREUS     Note: RIFAMPIN AND GENTAMICIN SHOULD NOT BE USED AS SINGLE DRUGS FOR TREATMENT OF STAPH INFECTIONS.   Report Status 01/09/2013 FINAL   Final   Organism ID, Bacteria STAPHYLOCOCCUS AUREUS   Final  ANAEROBIC CULTURE     Status: None   Collection Time    01/06/13  8:21 PM      Result Value Range Status   Specimen Description ARM FOREARM LEFT   Final   Special Requests NONE   Final   Gram Stain     Final   Value: RARE WBC PRESENT, PREDOMINANTLY PMN     NO SQUAMOUS EPITHELIAL CELLS SEEN     NO ORGANISMS SEEN   Culture NO ANAEROBES ISOLATED   Final   Report Status 01/11/2013 FINAL   Final     Studies: No results found.  Scheduled Meds: . amiodarone  400 mg Oral Daily  .  ceFAZolin (ANCEF) IV  2 g Intravenous Q8H  . clotrimazole  1 Applicatorful Vaginal QHS  . ferrous sulfate  325 mg Oral TID PC  . insulin aspart  0-5 Units Subcutaneous QHS  . magic mouthwash  5 mL Oral QID  . metoprolol tartrate  50 mg Oral BID  . polyethylene glycol  17 g Oral BID  . rivaroxaban  15 mg Oral BID WC   Followed by  . [START ON 01/31/2013] rivaroxaban  20 mg Oral Q supper  . sodium chloride  10-40 mL Intracatheter Q12H  . sodium chloride  3 mL Intravenous Q12H   Continuous Infusions:    Principal Problem:   Cellulitis Active Problems:   Hyponatremia   Hypokalemia   Fasting hyperglycemia   Leukocytosis   Hypotension   Diabetes mellitus type 2, noninsulin dependent   Hypertrophic cardiomyopathy   Atrial fibrillation   Acute blood loss anemia    Time spent: > 35 minutes    Penny Pia  [facility] Pager [phone]. If 7PM-7AM, please contact  night-coverage at [email or web address], password [facility] 01/15/2013, 1:01 PM  LOS: 15 days

## 2013-01-16 LAB — GLUCOSE, CAPILLARY
Glucose-Capillary: 111 mg/dL — ABNORMAL HIGH (ref 70–99)
Glucose-Capillary: 70 mg/dL (ref 70–99)
Glucose-Capillary: 112 mg/dL — ABNORMAL HIGH (ref 70–99)
Glucose-Capillary: 100 mg/dL — ABNORMAL HIGH (ref 70–99)

## 2013-01-16 LAB — BASIC METABOLIC PANEL
BUN: 12 mg/dL (ref 6–23)
CO2: 31 mEq/L (ref 19–32)
Calcium: 8.5 mg/dL (ref 8.4–10.5)
Creatinine, Ser: 0.59 mg/dL (ref 0.50–1.10)
GFR calc Af Amer: >90 mL/min (ref >90)

## 2013-01-16 MED ORDER — POTASSIUM CHLORIDE 20 MEQ/15ML (10%) PO LIQD
40.0000 meq | Freq: Once | ORAL | Status: AC
  Administered 2013-01-16: 40 meq via ORAL
  Filled 2013-01-16: qty 30

## 2013-01-16 NOTE — Progress Notes (Signed)
[facility] PROGRESS NOTE  [name]:[id number] DOB: 01-20-1946 DOA: 12/31/2012 PCP: [name], MD  Assessment/Plan:  Cellulitis/left olecranon bursitis - patient initially failed outpatient therapy. MRI done here in the hospital showed left infected olecranon bursitis and suspect myofasciitis. Orthopedic surgery have been consulted, patient is now status post excisional debridement of the left elbow and forearm on 6/5 and closure with drains 6/8. Wound culture from the initial surgery showed MSSA. Infectious disease have been consulted, and recommended cefazolin to be continued for 6 weeks with day one starting from 01/10/13. ID will arrange for followup in the clinic.   - Following the initial surgery on 6/5, patient was persistently hypotensive, requiring to be in the step down unit up until 6/9.  - She had pretty significant leukocytosis up to 29,000, now improving. Patient is currently on cefazolin - Ortho has cleared at this point.  Plan will be to transition to SNF once bed available.  Left arm DVT - on Xarelto started on 6/4   Atrial flutter -  - cardiology on board and managing.  - Amiodarone and Metoprolol on board - Plan on transitioning to SNF Monday should heart rate stay controlled on above medications.   Hyponatremia  - has resolved  Hypokalemia - At this point replaced orally. - reassess next am.  Constipation - resolved.  DM II  - Place on SSI and diabetic diet.  Blood sugars have been relatively well controlled. - hemoglobin a1c > 10 on last check on 01/01/13 - At this point am considering placing on oral agent while in the hospital as patient would prefer to take oral agent as opposed to insulin regimen. - Will start glipizide.  ABLA  - Resolved and hemoglobin stable.  Code Status: full Family Communication: Discussed with patient Disposition Plan: Once atrial fibrillation better  controlled.   Consultants:  Cardiology  Ortho  ID  Procedures:  I and D  Antibiotics:  cefazolin  HPI/Subjective: No new complaints. No acute issues overnight.    Objective: Filed Vitals:   01/15/13 1451 01/15/13 2139 01/16/13 0603 01/16/13 1332  BP: 137/59 151/68 141/63 153/57  Pulse: 76 68 67 64  Temp: 97.9 F (36.6 C) 97.4 F (36.3 C) 98.7 F (37.1 C) 97.5 F (36.4 C)  TempSrc: Oral Oral Oral Oral  Resp: 20 20 20 20   Height:      Weight:      SpO2: 93% 94% 95% 99%    Intake/Output Summary (Last 24 hours) at 01/16/13 1348 Last data filed at 01/16/13 1039  Gross per 24 hour  Intake    510 ml  Output    400 ml  Net    110 ml   Filed Weights   01/07/13 0015 01/08/13 0400 01/10/13 2058  Weight: 81.3 kg (179 lb 3.7 oz) 84.8 kg (186 lb 15.2 oz) 77.111 kg (170 lb)    Exam:   General:  Pt in NAD, Alert and Awake  Cardiovascular: irregularly irregular, no murmurs  Respiratory: cta bl, no wheezes  Abdomen: soft, NT, ND  Musculoskeletal: left arm in ace bandages.    Data Reviewed: Basic Metabolic Panel:  Recent Labs Lab 01/11/13 0500 01/12/13 0430 01/14/13 0930 01/15/13 0500 01/16/13 0500  NA 132* 134* 134* 135 137  K 4.1 3.9 3.8 3.3* 3.6  CL 98 98 99 99 101  CO2 26 29 29  32 31  GLUCOSE 108* 172* 203* 179* 116*  BUN 8 9 13 13 12   CREATININE 0.55 0.57 0.62 0.58 0.59  CALCIUM 8.6 8.5 7.9* 8.4 8.5   Liver Function Tests: No results found for this basename: AST, ALT, ALKPHOS, BILITOT, PROT, ALBUMIN,  in the last 168 hours No results found for this basename: LIPASE, AMYLASE,  in the last 168 hours No results found for this basename: AMMONIA,  in the last 168 hours CBC:  Recent Labs Lab 01/10/13 0345 01/11/13 0500 01/12/13 0430 01/13/13 0900 01/15/13 0500  WBC 16.0* 14.8* 19.0* 12.4* 11.6*  NEUTROABS  --   --   --  9.6*  --   HGB 9.4* 10.8* 10.1* 10.2* 9.3*  HCT 28.6* 32.4* 31.2* 30.6* 29.2*  MCV 88.5 89.5 88.9 89.2 89.6  PLT 409*  635* 628* 673* 635*   Cardiac Enzymes: No results found for this basename: CKTOTAL, CKMB, CKMBINDEX, TROPONINI,  in the last 168 hours BNP (last 3 results)  Recent Labs  01/02/13 0354  PROBNP 6103.0*   CBG:  Recent Labs Lab 01/15/13 1157 01/15/13 1700 01/15/13 2137 01/16/13 0741 01/16/13 1144  GLUCAP 160* 104* 113* 111* 156*    Recent Results (from the past 240 hour(s))  SURGICAL PCR SCREEN     Status: None   Collection Time    01/06/13  5:56 PM      Result Value Range Status   MRSA, PCR NEGATIVE  NEGATIVE Final   Staphylococcus aureus NEGATIVE  NEGATIVE Final   Comment:            The Xpert SA Assay (FDA     approved for NASAL specimens     in patients over 40 years of age),     is one component of     a comprehensive surveillance     program.  Test performance has     been validated by [employer] for patients greater     than or equal to 30 year old.     It is not intended     to diagnose infection nor to     guide or monitor treatment.  ANAEROBIC CULTURE     Status: None   Collection Time    01/06/13  7:58 PM      Result Value Range Status   Specimen Description ARM LEFT   Final   Special Requests NONE   Final   Gram Stain     Final   Value: MODERATE WBC PRESENT,BOTH PMN AND MONONUCLEAR     NO SQUAMOUS EPITHELIAL CELLS SEEN     NO ORGANISMS SEEN   Culture NO ANAEROBES ISOLATED   Final   Report Status 01/11/2013 FINAL   Final  WOUND CULTURE     Status: None   Collection Time    01/06/13  7:58 PM      Result Value Range Status   Specimen Description ARM LEFT   Final   Special Requests NONE   Final   Gram Stain     Final   Value: RARE WBC PRESENT, PREDOMINANTLY PMN     NO SQUAMOUS EPITHELIAL CELLS SEEN     NO ORGANISMS SEEN   Culture NO GROWTH 2 DAYS   Final   Report Status 01/09/2013 FINAL   Final  WOUND CULTURE     Status: None   Collection Time    01/06/13  8:21 PM      Result Value Range Status   Specimen Description ARM FOREARM LEFT    Final   Special Requests NONE   Final   Gram Stain  Final   Value: FEW WBC PRESENT,BOTH PMN AND MONONUCLEAR     NO SQUAMOUS EPITHELIAL CELLS SEEN     RARE GRAM POSITIVE COCCI     IN PAIRS   Culture     Final   Value: FEW STAPHYLOCOCCUS AUREUS     Note: RIFAMPIN AND GENTAMICIN SHOULD NOT BE USED AS SINGLE DRUGS FOR TREATMENT OF STAPH INFECTIONS.   Report Status 01/09/2013 FINAL   Final   Organism ID, Bacteria STAPHYLOCOCCUS AUREUS   Final  ANAEROBIC CULTURE     Status: None   Collection Time    01/06/13  8:21 PM      Result Value Range Status   Specimen Description ARM FOREARM LEFT   Final   Special Requests NONE   Final   Gram Stain     Final   Value: RARE WBC PRESENT, PREDOMINANTLY PMN     NO SQUAMOUS EPITHELIAL CELLS SEEN     NO ORGANISMS SEEN   Culture NO ANAEROBES ISOLATED   Final   Report Status 01/11/2013 FINAL   Final     Studies: No results found.  Scheduled Meds: . amiodarone  400 mg Oral Daily  .  ceFAZolin (ANCEF) IV  2 g Intravenous Q8H  . clotrimazole  1 Applicatorful Vaginal QHS  . ferrous sulfate  325 mg Oral TID PC  . glipiZIDE  5 mg Oral QAC lunch  . insulin aspart  0-15 Units Subcutaneous TID WC  . magic mouthwash  5 mL Oral QID  . metoprolol tartrate  50 mg Oral BID  . polyethylene glycol  17 g Oral BID  . rivaroxaban  15 mg Oral BID WC   Followed by  . [START ON 01/31/2013] rivaroxaban  20 mg Oral Q supper  . sodium chloride  10-40 mL Intracatheter Q12H  . sodium chloride  3 mL Intravenous Q12H   Continuous Infusions:    Principal Problem:   Cellulitis Active Problems:   Hyponatremia   Hypokalemia   Fasting hyperglycemia   Leukocytosis   Hypotension   Diabetes mellitus type 2, noninsulin dependent   Hypertrophic cardiomyopathy   Atrial fibrillation   Acute blood loss anemia    Time spent: > 35 minutes    Penny Pia  [facility] Pager [phone]. If 7PM-7AM, please contact night-coverage at [email or web address], password  [facility] 01/16/2013, 1:48 PM  LOS: 16 days

## 2013-01-16 NOTE — Progress Notes (Signed)
Patient ID: [name], female   DOB: 05-Mar-1946, 67 y.o.   MRN: [id number] Dressing dry, moving fingers. Office follow up [facility]. [phone] with Dr. Otelia Sergeant or Dr. August Saucer.

## 2013-01-16 NOTE — Progress Notes (Signed)
Subjective:  Another good night. Not short of breath. Arm is feeling better at this time. No recurrence of atrial fibrillation. Tolerating amiodarone well.   Subjective:  Vital Signs in the last 24 hours: BP 141/63  Pulse 67  Temp(Src) 98.7 F (37.1 C) (Oral)  Resp 20  Ht 5\' 4"  (1.626 m)  Wt 77.111 kg (170 lb)  BMI 29.17 kg/m2  SpO2 95%  Physical Exam: Obese white female in no acute distress Lungs:  Reduced breath sounds Cardiac:  Regular rhythm, normal S1 and S2, no S3, 2/6 systolic murmur today.  Extremities:  Left arm bandaged  Intake/Output from previous day: 06/14 0701 - 06/15 0700 In: 710 [P.O.:360; IV Piggyback:350] Out: -   Weight Filed Weights   01/07/13 0015 01/08/13 0400 01/10/13 2058  Weight: 81.3 kg (179 lb 3.7 oz) 84.8 kg (186 lb 15.2 oz) 77.111 kg (170 lb)    Lab Results: Basic Metabolic Panel:  Recent Labs  40/98/11 0500 01/16/13 0500  NA 135 137  K 3.3* 3.6  CL 99 101  CO2 32 31  GLUCOSE 179* 116*  BUN 13 12  CREATININE 0.58 0.59    CBC:  Recent Labs  01/15/13 0500  WBC 11.6*  HGB 9.3*  HCT 29.2*  MCV 89.6  PLT 635*   BNP    Component Value Date/Time   PROBNP 6103.0* 01/02/2013 0354    Telemetry: Sinus rhythm  with occasional PAC's and PVCs.  ECHO Hypertrophic cardiomyopathy with mild SAM of anterior leaflet of MV, Moderate to severe LA enlargement  Assessment/Plan:  1. Hypertrophic cardiomyopathy with SAM of mitral valve.   2. Atrial fibrillation recurrent currently on amiodarone and in normal sinus rhythm 3. Hyponatremia improved 4. DVT of left arm  5. Hypokalemia  Recommendations:  Additional potassium today. Continue amiodarone. If stable may go to [facility] in the morning on a lower dose of amiodarone 200 mg twice daily for one month. I would need to see her in the office in 2-3 weeks.  Darden Palmer  MD [facility] Cardiology  01/16/2013, 9:05 AM

## 2013-01-17 DIAGNOSIS — B9689 Other specified bacterial agents as the cause of diseases classified elsewhere: Secondary | ICD-10-CM | POA: Diagnosis not present

## 2013-01-17 DIAGNOSIS — L0291 Cutaneous abscess, unspecified: Secondary | ICD-10-CM | POA: Diagnosis not present

## 2013-01-17 DIAGNOSIS — I4892 Unspecified atrial flutter: Secondary | ICD-10-CM | POA: Diagnosis not present

## 2013-01-17 DIAGNOSIS — IMO0002 Reserved for concepts with insufficient information to code with codable children: Secondary | ICD-10-CM | POA: Diagnosis not present

## 2013-01-17 DIAGNOSIS — R279 Unspecified lack of coordination: Secondary | ICD-10-CM | POA: Diagnosis not present

## 2013-01-17 DIAGNOSIS — S51809A Unspecified open wound of unspecified forearm, initial encounter: Secondary | ICD-10-CM | POA: Diagnosis not present

## 2013-01-17 DIAGNOSIS — I4891 Unspecified atrial fibrillation: Secondary | ICD-10-CM | POA: Diagnosis not present

## 2013-01-17 DIAGNOSIS — K5900 Constipation, unspecified: Secondary | ICD-10-CM | POA: Diagnosis not present

## 2013-01-17 DIAGNOSIS — E119 Type 2 diabetes mellitus without complications: Secondary | ICD-10-CM | POA: Diagnosis not present

## 2013-01-17 DIAGNOSIS — I82409 Acute embolism and thrombosis of unspecified deep veins of unspecified lower extremity: Secondary | ICD-10-CM | POA: Diagnosis not present

## 2013-01-17 DIAGNOSIS — E871 Hypo-osmolality and hyponatremia: Secondary | ICD-10-CM | POA: Diagnosis not present

## 2013-01-17 DIAGNOSIS — R7301 Impaired fasting glucose: Secondary | ICD-10-CM

## 2013-01-17 DIAGNOSIS — I742 Embolism and thrombosis of arteries of the upper extremities: Secondary | ICD-10-CM | POA: Diagnosis not present

## 2013-01-17 DIAGNOSIS — L0390 Cellulitis, unspecified: Secondary | ICD-10-CM | POA: Diagnosis not present

## 2013-01-17 DIAGNOSIS — R7881 Bacteremia: Secondary | ICD-10-CM | POA: Diagnosis not present

## 2013-01-17 DIAGNOSIS — M7020 Olecranon bursitis, unspecified elbow: Secondary | ICD-10-CM | POA: Diagnosis not present

## 2013-01-17 DIAGNOSIS — I749 Embolism and thrombosis of unspecified artery: Secondary | ICD-10-CM | POA: Diagnosis not present

## 2013-01-17 DIAGNOSIS — R079 Chest pain, unspecified: Secondary | ICD-10-CM | POA: Diagnosis not present

## 2013-01-17 DIAGNOSIS — R5381 Other malaise: Secondary | ICD-10-CM | POA: Diagnosis not present

## 2013-01-17 DIAGNOSIS — F329 Major depressive disorder, single episode, unspecified: Secondary | ICD-10-CM | POA: Diagnosis not present

## 2013-01-17 DIAGNOSIS — Z4889 Encounter for other specified surgical aftercare: Secondary | ICD-10-CM | POA: Diagnosis not present

## 2013-01-17 DIAGNOSIS — M6281 Muscle weakness (generalized): Secondary | ICD-10-CM | POA: Diagnosis not present

## 2013-01-17 DIAGNOSIS — A499 Bacterial infection, unspecified: Secondary | ICD-10-CM | POA: Diagnosis not present

## 2013-01-17 DIAGNOSIS — I421 Obstructive hypertrophic cardiomyopathy: Secondary | ICD-10-CM | POA: Diagnosis not present

## 2013-01-17 DIAGNOSIS — Z5189 Encounter for other specified aftercare: Secondary | ICD-10-CM | POA: Diagnosis not present

## 2013-01-17 DIAGNOSIS — R269 Unspecified abnormalities of gait and mobility: Secondary | ICD-10-CM | POA: Diagnosis not present

## 2013-01-17 DIAGNOSIS — M7150 Other bursitis, not elsewhere classified, unspecified site: Secondary | ICD-10-CM | POA: Diagnosis not present

## 2013-01-17 LAB — BASIC METABOLIC PANEL
BUN: 10 mg/dL (ref 6–23)
Chloride: 89 mEq/L — ABNORMAL LOW (ref 96–112)
GFR calc Af Amer: >90 mL/min (ref >90)
GFR calc non Af Amer: >90 mL/min (ref >90)
Potassium: 3.2 mEq/L — ABNORMAL LOW (ref 3.5–5.1)
Sodium: 131 mEq/L — ABNORMAL LOW (ref 135–145)

## 2013-01-17 LAB — GLUCOSE, CAPILLARY
Glucose-Capillary: 133 mg/dL — ABNORMAL HIGH (ref 70–99)
Glucose-Capillary: 163 mg/dL — ABNORMAL HIGH (ref 70–99)

## 2013-01-17 MED ORDER — GLIPIZIDE 5 MG PO TABS: 5.0000 mg | ORAL_TABLET | Freq: Every day | ORAL | Status: DC

## 2013-01-17 MED ORDER — RIVAROXABAN 20 MG PO TABS: 20.0000 mg | ORAL_TABLET | Freq: Every day | ORAL | Status: DC

## 2013-01-17 MED ORDER — HYDROCODONE-ACETAMINOPHEN 10-325 MG PO TABS: 1.0000 | ORAL_TABLET | Freq: Four times a day (QID) | ORAL | Status: DC | PRN

## 2013-01-17 MED ORDER — RIVAROXABAN 15 MG PO TABS: 15.0000 mg | ORAL_TABLET | Freq: Two times a day (BID) | ORAL | Status: DC

## 2013-01-17 MED ORDER — CEFAZOLIN SODIUM-DEXTROSE 2-3 GM-% IV SOLR: 2.0000 g | Freq: Three times a day (TID) | INTRAVENOUS | Status: DC

## 2013-01-17 MED ORDER — AMIODARONE HCL 200 MG PO TABS: 200.0000 mg | ORAL_TABLET | Freq: Two times a day (BID) | ORAL | Status: DC

## 2013-01-17 MED ORDER — FERROUS SULFATE 325 (65 FE) MG PO TABS: 325.0000 mg | ORAL_TABLET | Freq: Three times a day (TID) | ORAL | Status: DC

## 2013-01-17 MED ORDER — HEPARIN SOD (PORK) LOCK FLUSH 100 UNIT/ML IV SOLN
250.0000 [IU] | INTRAVENOUS | Status: AC | PRN
  Administered 2013-01-17: 250 [IU]

## 2013-01-17 MED ORDER — POTASSIUM CHLORIDE CRYS ER 20 MEQ PO TBCR
40.0000 meq | EXTENDED_RELEASE_TABLET | Freq: Once | ORAL | Status: AC
  Administered 2013-01-17: 40 meq via ORAL
  Filled 2013-01-17: qty 2

## 2013-01-17 MED ORDER — METOPROLOL TARTRATE 50 MG PO TABS: 50.0000 mg | ORAL_TABLET | Freq: Two times a day (BID) | ORAL | Status: DC

## 2013-01-17 MED ORDER — DSS 100 MG PO CAPS: 100.0000 mg | ORAL_CAPSULE | Freq: Two times a day (BID) | ORAL | Status: DC | PRN

## 2013-01-17 MED ORDER — SENNA 8.6 MG PO TABS: 1.0000 | ORAL_TABLET | Freq: Every day | ORAL | Status: DC | PRN

## 2013-01-17 MED ORDER — METHOCARBAMOL 500 MG PO TABS: 500.0000 mg | ORAL_TABLET | Freq: Four times a day (QID) | ORAL | Status: DC | PRN

## 2013-01-17 MED ORDER — OXYCODONE HCL 5 MG PO TABS: 5.0000 mg | ORAL_TABLET | ORAL | Status: DC | PRN

## 2013-01-17 MED ORDER — CLOTRIMAZOLE 1 % VA CREA: 1.0000 | TOPICAL_CREAM | Freq: Every day | VAGINAL | Status: DC

## 2013-01-17 NOTE — Progress Notes (Signed)
Follow up for continued support.  Provided support around discharge and plans.  Will continue to follow while admitted.  Please page as needs arise.

## 2013-01-17 NOTE — Progress Notes (Signed)
Clinical Social Work  CSW faxed DC summary to [employer] who is agreeable to admission today. CSW prepared DC packet with FL2 and hard scripts included. CSW informed patient and RN of DC plans and both parties agreeable. CSW coordinated transportation via [facility] for 1300 pick up. (Service Request #: [phone]). CSW is signing off but available if needed.  Unk Lightning, LCSW (Coverage for [employer])

## 2013-01-17 NOTE — Progress Notes (Signed)
Patient discharge to [facility], Report given tot Pattricia Boss RN, patient is alert and oriented no distress noted upon discharge. Dc with PICCline intact no s/s of infection .

## 2013-01-17 NOTE — Discharge Summary (Addendum)
Physician Discharge Summary  [name] ZOX:[id number] DOB: 02-23-1946 DOA: 12/31/2012  PCP: [name], MD  Admit date: 12/31/2012 Discharge date: 01/17/2013  Time spent: > 35 minutes  Recommendations for Outpatient Follow-up:  1. Please be sure to follow up on patient's blood sugars and adjust her medication accordingly.   2. Patient to follow up with cardiologist once discharged from [facility] home or sooner should any new concerns arise. 3. Will need cefazolin administration until 02/21/13.   Discharge Diagnoses:  Principal Problem:   Cellulitis Active Problems:   Hyponatremia   Hypokalemia   Fasting hyperglycemia   Leukocytosis   Hypotension   Diabetes mellitus type 2, noninsulin dependent   Hypertrophic cardiomyopathy   Atrial fibrillation   Acute blood loss anemia   Discharge Condition: full  Diet recommendation: carb modified.  Filed Weights   01/07/13 0015 01/08/13 0400 01/10/13 2058  Weight: 81.3 kg (179 lb 3.7 oz) 84.8 kg (186 lb 15.2 oz) 77.111 kg (170 lb)    History of present illness:  67 y/o who presented with redness and swelling of LUE.  With further work up was found to have an LUE DVT started on xarelto, Bursitis requiring I and D by ortho, bacteremia with MSSA folllowed by ID with plans for IV ax until 02/21/13 with picc line in place, and new onset atrial fibrillation/flutter started on amiodarone by cardiology.  Hospital Course:  Cellulitis/left olecranon bursitis - patient initially failed outpatient therapy. MRI done here in the hospital showed left infected olecranon bursitis and suspect myofasciitis. Orthopedic surgery have been consulted, patient is now status post excisional debridement of the left elbow and forearm on 6/5 and closure with drains 6/8. Wound culture from the initial surgery showed MSSA. Infectious disease have been consulted, and recommended cefazolin to be continued for 6 weeks with day one starting from 01/10/13. ID will  arrange for followup in the clinic.   - Following the initial surgery on 6/5, patient was persistently hypotensive, requiring to be in the step down unit up until 6/9.  - She had pretty significant leukocytosis up to 29,000, now improving on cefazolin. Patient is currently on cefazolin which she should continue until 02/21/13  - Ortho has cleared at this point.  Plan will be to transition to SNF once bed available.  Will need follow up after discharge with from hospital with orthopaedic surgeon.  Left arm DVT - on Xarelto started on 6/4  Addendum: due to patient not obtaining full doses of xarelto patient should take the 15 mg po bid until 01/30/13, then on 01/31/13 take xarelto 20 mg po daily  Atrial flutter -  - cardiology on board and managing.  - Amiodarone and Metoprolol on board - Plan on transitioning to SNF Monday should heart rate stay controlled on above medications.   Hyponatremia  - with continued improvement in oral intake will suspect resolution.  Hypokalemia - Will replace orally prior to discharge.  Constipation - resolved. - nonetheless given that patient will be on opiods will plan on discharging on stool softeners and laxatives.  DM II  -Will discharge patient on glipizide - pcp to further decide what hypoglycemic agents to use.  Did no initiate metformin while in house due to possibility of further testing.   ABLA  - Resolved and hemoglobin stable.  Procedures:  I and D by orthopaedic surgeon  Consultations:  Ortho: Dr. Ophelia Charter  Cardiology: Dr. Donnie Aho  ID: Dr. Kellie Shropshire   Discharge Exam: Filed Vitals:   01/16/13 8119  01/16/13 1332 01/16/13 2122 01/17/13 0522  BP: 141/63 153/57 155/71 159/47  Pulse: 67 64 64 63  Temp: 98.7 F (37.1 C) 97.5 F (36.4 C) 98.2 F (36.8 C) 98.5 F (36.9 C)  TempSrc: Oral Oral Oral Oral  Resp: 20 20 18 16   Height:      Weight:      SpO2: 95% 99% 99% 94%    General: Pt in NAD, Alert and Awake Cardiovascular:  normal s1 and s2, no murmurs Respiratory: CTA BL, no wheezes  Discharge Instructions  Discharge Orders   Future Orders Complete By Expires     Call MD for:  redness, tenderness, or signs of infection (pain, swelling, redness, odor or green/yellow discharge around incision site)  As directed     Call MD for:  severe uncontrolled pain  As directed     Call MD for:  temperature >100.4  As directed     Diet - low sodium heart healthy  As directed     Discharge instructions  As directed     Comments:      Please be sure to follow up with the Dr. Donnie Aho cardiologist in 2-3 weeks or sooner.  Also you will need to follow up with your primary care physician in 1-2 weeks for further recommendations regarding your blood sugars.  Please be sure to call you orthopaedic surgeon and confirm follow up given recent operation with needed post hospital follow up.    Increase activity slowly  As directed     Non weight bearing  As directed     Scheduling Instructions:      No weight bearing or lifting with the left arm.        Medication List    STOP taking these medications       cephALEXin 500 MG capsule  Commonly known as:  KEFLEX     sulfamethoxazole-trimethoprim 800-160 MG per tablet  Commonly known as:  BACTRIM DS      TAKE these medications       amiodarone 200 MG tablet  Commonly known as:  PACERONE  Take 1 tablet (200 mg total) by mouth 2 (two) times daily.     ceFAZolin 2-3 GM-% Solr  Commonly known as:  ANCEF  Inject 50 mLs (2 g total) into the vein every 8 (eight) hours.     clotrimazole 1 % vaginal cream  Commonly known as:  GYNE-LOTRIMIN  Place 1 Applicatorful vaginally at bedtime.     DSS 100 MG Caps  Take 100 mg by mouth 2 (two) times daily as needed.     ferrous sulfate 325 (65 FE) MG tablet  Take 1 tablet (325 mg total) by mouth 3 (three) times daily after meals.     glipiZIDE 5 MG tablet  Commonly known as:  GLUCOTROL  Take 1 tablet (5 mg total) by mouth daily  before lunch.     HYDROcodone-acetaminophen 10-325 MG per tablet  Commonly known as:  NORCO  Take 1 tablet by mouth every 6 (six) hours as needed for pain.     methocarbamol 500 MG tablet  Commonly known as:  ROBAXIN  Take 1 tablet (500 mg total) by mouth every 6 (six) hours as needed.     metoprolol 50 MG tablet  Commonly known as:  LOPRESSOR  Take 1 tablet (50 mg total) by mouth 2 (two) times daily.     oxyCODONE 5 MG immediate release tablet  Commonly known as:  Oxy IR/ROXICODONE  Take  1-2 tablets (5-10 mg total) by mouth every 3 (three) hours as needed.     Rivaroxaban 15 MG Tabs tablet  Commonly known as:  XARELTO  Take 1 tablet (15 mg total) by mouth 2 (two) times daily with a meal.     Rivaroxaban 20 MG Tabs  Commonly known as:  XARELTO  Take 1 tablet (20 mg total) by mouth daily with supper.  Start taking on:  01/27/2013     senna 8.6 MG Tabs  Commonly known as:  SENOKOT  Take 1 tablet (8.6 mg total) by mouth daily as needed.       No Known Allergies     Follow-up Information   Follow up with Cammy Copa, MD In 1 week.   Contact information:   [address] [place] [address] [phone]       Follow up with TILLEY JR,W SPENCER, MD. Schedule an appointment as soon as possible for a visit in 4 weeks. (once out of [facility])    Contact information:   [address] [place] [address] [phone]        The results of significant diagnostics from this hospitalization (including imaging, microbiology, ancillary and laboratory) are listed below for reference.    Significant Diagnostic Studies: Dg Abd 1 View  01/08/2013   *RADIOLOGY REPORT*  Clinical Data: Constipation for the past week.  ABDOMEN - 1 VIEW  Comparison: No priors.  Findings: Gas and stool are seen scattered throughout the colon extending to the level of the distal rectum. Moderate volume of stool.  No pathologic distension of small bowel is noted.  No gross  evidence of pneumoperitoneum.  IMPRESSION: 1.  Nonobstructive bowel gas pattern. 2.  No pneumoperitoneum. 3.  Moderate volume of stool compatible with reported history of constipation.   Original Report Authenticated By: Trudie Reed, M.D.   Mr Forearm Left Wo/w Cm  01/06/2013   *RADIOLOGY REPORT*  Clinical Data: Right arm cellulitis with erythema and pain.  MRI OF THE LEFT FOREARM WITHOUT AND WITH CONTRAST  Technique:  Multiplanar, multisequence MR imaging was performed both before and after administration of intravenous contrast.  Contrast: 15mL MULTIHANCE GADOBENATE DIMEGLUMINE 529 MG/ML IV SOLN  Comparison: Radiographs 12/31/2012 and ultrasound 01/05/2013.  Findings: There is extensive subcutaneous edema throughout the forearm which extends beyond the field of view into the upper arm and hand.  Posterior to the proximal ulna is a large peripherally enhancing fluid collection within the subcutaneous fat.  This demonstrates heterogeneous T2 signal and measures approximately 7.0 x 4.3 x 1.6 cm.  This likely represents infected olecranon bursitis.  This subcutaneous fluid extends proximally into the distal upper arm, incompletely visualized.  There is also ill-defined peripherally enhancing fluid within and between the proximal to mid forearm flexor musculature. The intermuscular components are ill defined but most apparent on the postcontrast axial images.  These are fairly extensive and extend from the level of the elbow into the mid forearm.  Appearance is highly concerning for myofasciitis. There is mild tenosynovitis of the flexor tendons within the distal forearm.  There is a small elbow joint effusion without suspicious synovial enhancement.  No significant effusion is identified at the wrist. There is no evidence of osteomyelitis within the radius, ulna or distal humerus.  IMPRESSION:  1.  Findings consistent with diffuse forearm cellulitis with a complex subcutaneous fluid collection posterior to the  proximal ulna, suspicious for infected olecranon bursitis. 2.  In addition, there is muscular  edema with peripherally enhancing intermuscular fluid collections in the proximal forearm suspicious for myofasciitis. 3.  No evidence of intra-articular infection or osteomyelitis.  These results were called by telephone on 01/06/2013 at 0845 hours to Dr. Elvera Lennox, who verbally acknowledged these results. Orthopedic surgical consultation recommended.   Original Report Authenticated By: Carey Bullocks, M.D.   [place] Extrem Up Left Comp  01/05/2013   *RADIOLOGY REPORT*  Clinical Data: Pain and swelling.  Possible abscess.  ULTRASOUND LEFT UPPER EXTREMITY COMPLETE  Technique:  Ultrasound examination of the left forearm was performed including evaluation of the muscles, tendons, joint, and adjacent soft tissues.  Comparison:  None.  Findings: Diffuse edematous soft tissues throughout the left forearm with probable fascial fluid but no discrete abscess.  IMPRESSION: Diffuse edema and probable fascial fluid but no discrete abscess. MR would be helpful for further evaluation.   Original Report Authenticated By: Rudie Meyer, M.D.   Dg Chest Port 1 View  01/01/2013   *RADIOLOGY REPORT*  Clinical Data: Sepsis  PORTABLE CHEST - 1 VIEW  Comparison: None.  Findings: Increased interstitial markings/perihilar opacities, possibly reflecting interstitial edema, less likely atypical infection.  No pleural effusion or pneumothorax.  Cardiomegaly.  IMPRESSION: Cardiomegaly with increased interstitial markings/perihilar opacities, favored to reflect interstitial edema.  Given the history, atypical infection is not excluded.   Original Report Authenticated By: Charline Bills, M.D.   Dg Humerus Left  12/31/2012   *RADIOLOGY REPORT*  Clinical Data: Left arm injury.  Cellulitis.  Rule out soft tissue gas.  LEFT HUMERUS - 2+ VIEW  Comparison: None  Findings: No radio-opaque foreign body.    No soft tissue gas.  No osseous destruction.   IMPRESSION: No acute findings.   Original Report Authenticated By: Jeronimo Greaves, M.D.    Microbiology: No results found for this or any previous visit (from the past 240 hour(s)).   Labs: Basic Metabolic Panel:  Recent Labs Lab 01/12/13 0430 01/14/13 0930 01/15/13 0500 01/16/13 0500 01/17/13 0621  NA 134* 134* 135 137 131*  K 3.9 3.8 3.3* 3.6 3.2*  CL 98 99 99 101 89*  CO2 29 29 32 31 28  GLUCOSE 172* 203* 179* 116* 341*  BUN 9 13 13 12 10   CREATININE 0.57 0.62 0.58 0.59 0.54  CALCIUM 8.5 7.9* 8.4 8.5 7.9*   Liver Function Tests: No results found for this basename: AST, ALT, ALKPHOS, BILITOT, PROT, ALBUMIN,  in the last 168 hours No results found for this basename: LIPASE, AMYLASE,  in the last 168 hours No results found for this basename: AMMONIA,  in the last 168 hours CBC:  Recent Labs Lab 01/11/13 0500 01/12/13 0430 01/13/13 0900 01/15/13 0500  WBC 14.8* 19.0* 12.4* 11.6*  NEUTROABS  --   --  9.6*  --   HGB 10.8* 10.1* 10.2* 9.3*  HCT 32.4* 31.2* 30.6* 29.2*  MCV 89.5 88.9 89.2 89.6  PLT 635* 628* 673* 635*   Cardiac Enzymes: No results found for this basename: CKTOTAL, CKMB, CKMBINDEX, TROPONINI,  in the last 168 hours BNP: BNP (last 3 results)  Recent Labs  01/02/13 0354  PROBNP 6103.0*   CBG:  Recent Labs Lab 01/16/13 1144 01/16/13 1714 01/16/13 1902 01/16/13 2119 01/17/13 0728  GLUCAP 156* 70 112* 100* 133*       Signed:  Penny Pia  [facility] 01/17/2013, 10:47 AM

## 2013-01-17 NOTE — Progress Notes (Signed)
Subjective:  Feels good today. Was in sinus rhythm overnight.  QTC is fine. C/o mild anxiety last night.  Subjective:  Vital Signs in the last 24 hours: BP 159/47  Pulse 63  Temp(Src) 98.5 F (36.9 C) (Oral)  Resp 16  Ht 5\' 4"  (1.626 m)  Wt 77.111 kg (170 lb)  BMI 29.17 kg/m2  SpO2 94%  Physical Exam: Obese white female in no acute distress Lungs:  Reduced breath sounds Cardiac:  Regular rhythm, normal S1 and S2, no S3, 2/6 systolic murmur today.  Extremities:  Left arm bandaged  Intake/Output from previous day: 06/15 0701 - 06/16 0700 In: 390 [P.O.:240; IV Piggyback:150] Out: 400 [Urine:400]  Weight Filed Weights   01/07/13 0015 01/08/13 0400 01/10/13 2058  Weight: 81.3 kg (179 lb 3.7 oz) 84.8 kg (186 lb 15.2 oz) 77.111 kg (170 lb)    Lab Results: Basic Metabolic Panel:  Recent Labs  16/10/96 0500 01/17/13 0621  NA 137 131*  K 3.6 3.2*  CL 101 89*  CO2 31 28  GLUCOSE 116* 341*  BUN 12 10  CREATININE 0.59 0.54    CBC:  Recent Labs  01/15/13 0500  WBC 11.6*  HGB 9.3*  HCT 29.2*  MCV 89.6  PLT 635*   BNP    Component Value Date/Time   PROBNP 6103.0* 01/02/2013 0354    Telemetry: Sinus rhythm  with occasional PAC's.  ECHO Hypertrophic cardiomyopathy with mild SAM of anterior leaflet of MV, Moderate to severe LA enlargement  Assessment/Plan:  1. Hypertrophic cardiomyopathy with SAM of mitral valve.   2. Atrial fibrillation recurrent currently on amiodarone and in normal sinus rhythm 3. Hyponatremia improved 4. DVT of left arm  5. Hypokalemia still needs repleting.  Recommendations:  May d/c from cardiac viewpoint.  Need to replete K.  I would like to see her when she gets out of [facility], but can see her sooner if needed.  She should take amiodarone 200 BID for weeks and then reduce to 200 mg daily.  She also should take Xarelto for atrial  fibrillation chronically once the DVT treatment dose is finished.  Darden Palmer  MD  [facility]  01/17/2013, 8:54 AM

## 2013-01-18 DIAGNOSIS — I4891 Unspecified atrial fibrillation: Secondary | ICD-10-CM | POA: Diagnosis not present

## 2013-01-18 DIAGNOSIS — E871 Hypo-osmolality and hyponatremia: Secondary | ICD-10-CM | POA: Diagnosis not present

## 2013-01-18 DIAGNOSIS — I82409 Acute embolism and thrombosis of unspecified deep veins of unspecified lower extremity: Secondary | ICD-10-CM | POA: Diagnosis not present

## 2013-01-20 ENCOUNTER — Inpatient Hospital Stay: Payer: Medicare Other | Admitting: Internal Medicine

## 2013-01-27 ENCOUNTER — Telehealth: Payer: Self-pay | Admitting: *Deleted

## 2013-01-27 NOTE — Telephone Encounter (Signed)
[facility] appt - scheduled w/ Dr Luciana Axe, 02/02/13 @ 4:00 PM, checking w/ her sister for transportation

## 2013-01-27 NOTE — Anesthesia Postprocedure Evaluation (Signed)
  Anesthesia Post-op Note  Patient: [name]  Procedure(s) Performed: Procedure(s) (LRB): REDO INCISION AND DRAINAGE LEFT ELBOW (Left) SECONDARY CLOSURE OF WOUND  LEFT ELBOW (Left) APPLICATION OF WOUND VAC (Left)  Patient Location: PACU  Anesthesia Type: General  Level of Consciousness: awake and alert   Airway and Oxygen Therapy: Patient Spontanous Breathing  Post-op Pain: mild  Post-op Assessment: Post-op Vital signs reviewed, Patient's Cardiovascular Status Stable, Respiratory Function Stable, Patent Airway and No signs of Nausea or vomiting  Last Vitals:  Filed Vitals:   01/17/13 0522  BP: 159/47  Pulse: 63  Temp: 36.9 C  Resp: 16    Post-op Vital Signs: stable   Complications: No apparent anesthesia complications

## 2013-02-01 DIAGNOSIS — F329 Major depressive disorder, single episode, unspecified: Secondary | ICD-10-CM | POA: Diagnosis not present

## 2013-02-02 ENCOUNTER — Encounter: Payer: Self-pay | Admitting: Internal Medicine

## 2013-02-02 ENCOUNTER — Ambulatory Visit (INDEPENDENT_AMBULATORY_CARE_PROVIDER_SITE_OTHER): Payer: Medicare Other | Admitting: Internal Medicine

## 2013-02-02 VITALS — BP 112/64 | HR 57 | Temp 98.1°F | Ht 64.0 in | Wt 165.0 lb

## 2013-02-02 DIAGNOSIS — A499 Bacterial infection, unspecified: Secondary | ICD-10-CM

## 2013-02-02 DIAGNOSIS — M7150 Other bursitis, not elsewhere classified, unspecified site: Secondary | ICD-10-CM | POA: Diagnosis not present

## 2013-02-02 DIAGNOSIS — M7020 Olecranon bursitis, unspecified elbow: Secondary | ICD-10-CM | POA: Diagnosis not present

## 2013-02-02 DIAGNOSIS — M71122 Other infective bursitis, left elbow: Secondary | ICD-10-CM

## 2013-02-02 DIAGNOSIS — M719 Bursopathy, unspecified: Secondary | ICD-10-CM

## 2013-02-02 LAB — CBC WITH DIFFERENTIAL/PLATELET
Basophils Absolute: 0.1 10*3/uL (ref 0.0–0.1)
Eosinophils Absolute: 0.5 10*3/uL (ref 0.0–0.7)
Lymphocytes Relative: 23 % (ref 12–46)
Lymphs Abs: 2.1 10*3/uL (ref 0.7–4.0)
Neutrophils Relative %: 66 % (ref 43–77)
Platelets: 499 10*3/uL — ABNORMAL HIGH (ref 150–400)
RBC: 3.84 MIL/uL — ABNORMAL LOW (ref 3.87–5.11)
RDW: 14.8 % (ref 11.5–15.5)
WBC: 9.3 10*3/uL (ref 4.0–10.5)

## 2013-02-02 NOTE — Patient Instructions (Signed)
We will call with results and if ok, stop antibiotics on July 7th.

## 2013-02-03 ENCOUNTER — Encounter: Payer: Self-pay | Admitting: Internal Medicine

## 2013-02-03 DIAGNOSIS — M71129 Other infective bursitis, unspecified elbow: Secondary | ICD-10-CM | POA: Insufficient documentation

## 2013-02-03 LAB — SEDIMENTATION RATE: Sed Rate: 19 mm/hr (ref 0–22)

## 2013-02-03 NOTE — Assessment & Plan Note (Signed)
This seems to be resolving well and her inflammatory markers now are completely normal. Therefore I think a four-week course of antibiotics will be adequate and therefore she will stop at the end of 4 weeks which will be July 7. PICC line will then be pulled and she can return on a p.r.n. Basis. Her facility has been alerted.

## 2013-02-03 NOTE — Progress Notes (Signed)
  Subjective:    Patient ID: [name], female    DOB: July 07, 1946, 67 y.o.   MRN: [id number]  HPI She comes in for hospital followup. He developed left lower creatinine bursitis and some mild fasciitis and did grow MSSA in her wound. She was started on Ancef and had her last surgery on June 9. She has since had a PICC line and then on Ancef and tolerating it well. She denies any rashes, diarrhea or any new issues. Her incision has healed in her staples have been removed. She has seen her orthopedic doctor who feels it is doing well and she feels also is doing well. No recent fever or chills.   Review of Systems  Constitutional: Negative for fever and chills.  Gastrointestinal: Negative for nausea, abdominal pain, diarrhea and abdominal distention.  Musculoskeletal: Negative for joint swelling.  Skin: Negative for rash and wound.  Neurological: Negative for dizziness and headaches.  Hematological: Negative for adenopathy.  Psychiatric/Behavioral: Negative for dysphoric mood.       Objective:   Physical Exam  Constitutional: She appears well-developed and well-nourished. No distress.  HENT:  Mouth/Throat: No oropharyngeal exudate.  Eyes: No scleral icterus.  Cardiovascular: Normal rate, regular rhythm and normal heart sounds.   No murmur heard. Pulmonary/Chest: Effort normal and breath sounds normal. No respiratory distress. She has no wheezes.  Musculoskeletal:  Left arm with no cellulitis, well healed incision with Steri-Strips on top. No edema or tenderness of the arm  Lymphadenopathy:    She has no cervical adenopathy.  Skin: No rash noted. No erythema.  Psychiatric: She has a normal mood and affect.          Assessment & Plan:

## 2013-02-23 DIAGNOSIS — IMO0002 Reserved for concepts with insufficient information to code with codable children: Secondary | ICD-10-CM | POA: Diagnosis not present

## 2013-02-23 DIAGNOSIS — H2510 Age-related nuclear cataract, unspecified eye: Secondary | ICD-10-CM | POA: Diagnosis not present

## 2013-03-02 DIAGNOSIS — H2510 Age-related nuclear cataract, unspecified eye: Secondary | ICD-10-CM | POA: Diagnosis not present

## 2013-03-02 DIAGNOSIS — IMO0002 Reserved for concepts with insufficient information to code with codable children: Secondary | ICD-10-CM | POA: Diagnosis not present

## 2013-03-17 ENCOUNTER — Encounter: Payer: Self-pay | Admitting: Internal Medicine

## 2013-05-03 ENCOUNTER — Encounter: Payer: Self-pay | Admitting: Internal Medicine

## 2014-07-24 IMAGING — CR DG HUMERUS 2V *L*
2 series · 2 of 2 positions shown · non-contrast
Comparison: None

CLINICAL DATA: Left arm injury.  Cellulitis.  Rule out soft tissue
gas.

LEFT HUMERUS - 2+ VIEW

[x humerus ap left]
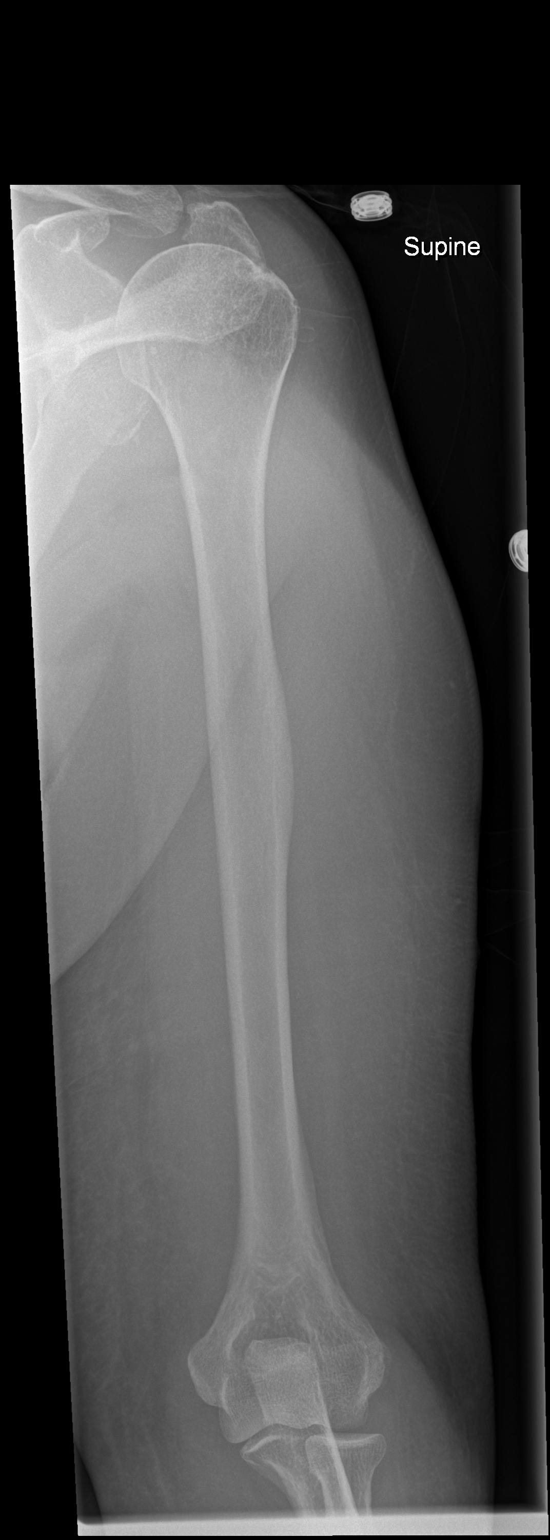

[x humerus lat left]
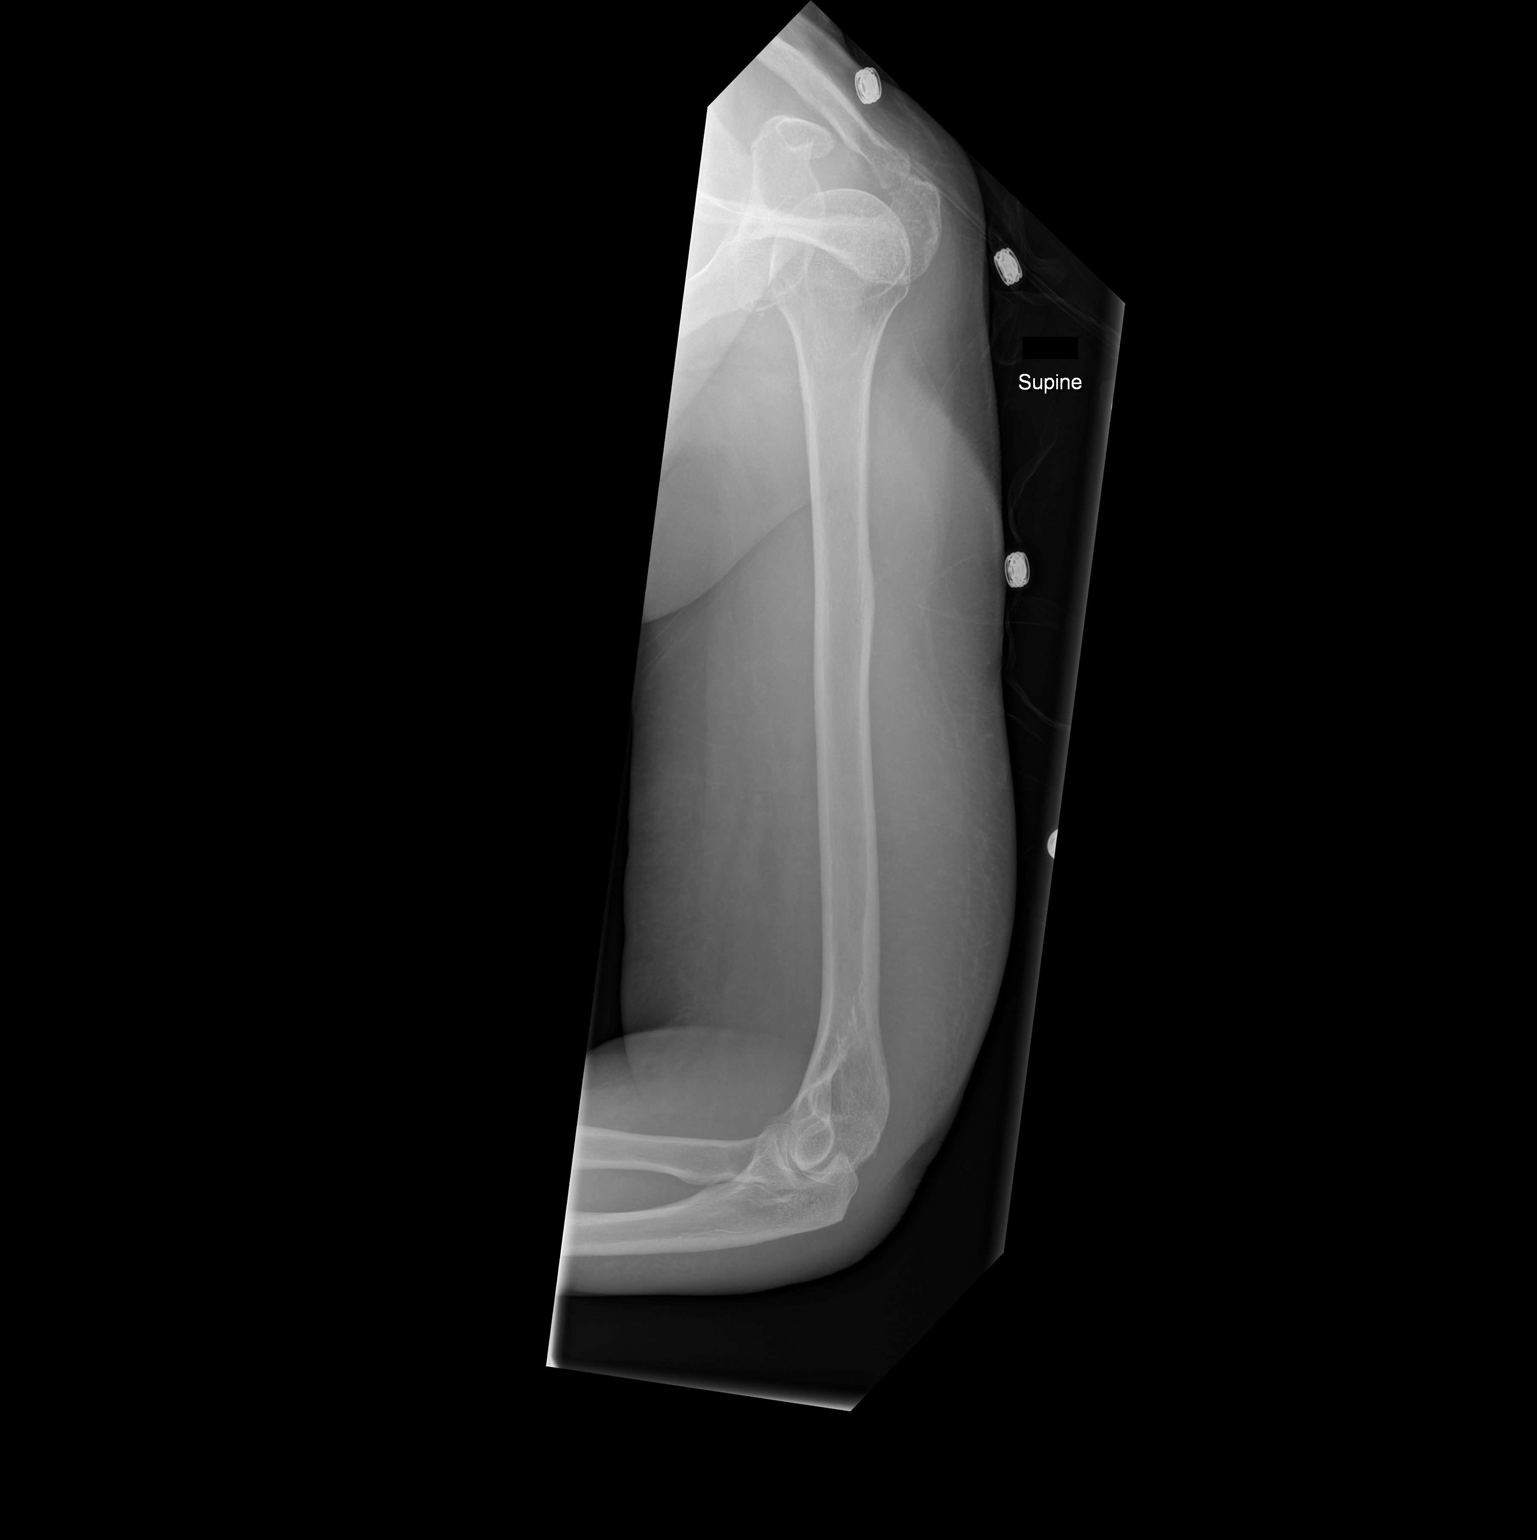

[2 of 2 positions shown; findings below may reference images not displayed]

FINDINGS: No radio-opaque foreign body.    No soft tissue gas.  No
osseous destruction.
IMPRESSION: No acute findings.

## 2015-03-30 ENCOUNTER — Encounter (HOSPITAL_COMMUNITY): Payer: Self-pay | Admitting: Oncology

## 2015-03-30 ENCOUNTER — Inpatient Hospital Stay (HOSPITAL_COMMUNITY)
Admission: EM | Admit: 2015-03-30 | Discharge: 2015-04-05 | DRG: 871 | Disposition: A | Discharge status: Skilled Nursing Facility | Payer: Medicare Other | Attending: Internal Medicine | Admitting: Internal Medicine

## 2015-03-30 DIAGNOSIS — Z87891 Personal history of nicotine dependence: Secondary | ICD-10-CM

## 2015-03-30 DIAGNOSIS — I422 Other hypertrophic cardiomyopathy: Secondary | ICD-10-CM | POA: Diagnosis not present

## 2015-03-30 DIAGNOSIS — R531 Weakness: Secondary | ICD-10-CM | POA: Diagnosis not present

## 2015-03-30 DIAGNOSIS — Z833 Family history of diabetes mellitus: Secondary | ICD-10-CM

## 2015-03-30 DIAGNOSIS — E119 Type 2 diabetes mellitus without complications: Secondary | ICD-10-CM

## 2015-03-30 DIAGNOSIS — A419 Sepsis, unspecified organism: Secondary | ICD-10-CM | POA: Diagnosis not present

## 2015-03-30 DIAGNOSIS — I959 Hypotension, unspecified: Secondary | ICD-10-CM | POA: Diagnosis present

## 2015-03-30 DIAGNOSIS — R0602 Shortness of breath: Secondary | ICD-10-CM | POA: Diagnosis not present

## 2015-03-30 DIAGNOSIS — I480 Paroxysmal atrial fibrillation: Secondary | ICD-10-CM | POA: Diagnosis not present

## 2015-03-30 DIAGNOSIS — R55 Syncope and collapse: Secondary | ICD-10-CM

## 2015-03-30 DIAGNOSIS — E669 Obesity, unspecified: Secondary | ICD-10-CM | POA: Diagnosis present

## 2015-03-30 DIAGNOSIS — Z9114 Patient's other noncompliance with medication regimen: Secondary | ICD-10-CM | POA: Diagnosis present

## 2015-03-30 DIAGNOSIS — Z809 Family history of malignant neoplasm, unspecified: Secondary | ICD-10-CM

## 2015-03-30 DIAGNOSIS — B961 Klebsiella pneumoniae [K. pneumoniae] as the cause of diseases classified elsewhere: Secondary | ICD-10-CM | POA: Diagnosis present

## 2015-03-30 DIAGNOSIS — N390 Urinary tract infection, site not specified: Secondary | ICD-10-CM | POA: Diagnosis not present

## 2015-03-30 DIAGNOSIS — R404 Transient alteration of awareness: Secondary | ICD-10-CM | POA: Diagnosis not present

## 2015-03-30 DIAGNOSIS — I509 Heart failure, unspecified: Secondary | ICD-10-CM

## 2015-03-30 DIAGNOSIS — I248 Other forms of acute ischemic heart disease: Secondary | ICD-10-CM | POA: Diagnosis not present

## 2015-03-30 DIAGNOSIS — I5033 Acute on chronic diastolic (congestive) heart failure: Secondary | ICD-10-CM | POA: Diagnosis not present

## 2015-03-30 DIAGNOSIS — I4891 Unspecified atrial fibrillation: Secondary | ICD-10-CM | POA: Diagnosis not present

## 2015-03-30 MED ORDER — SODIUM CHLORIDE 0.9 % IV BOLUS (SEPSIS)
2000.0000 mL | Freq: Once | INTRAVENOUS | Status: AC
  Administered 2015-03-30: 2000 mL via INTRAVENOUS

## 2015-03-30 MED ORDER — ONDANSETRON HCL 4 MG/2ML IJ SOLN
4.0000 mg | Freq: Once | INTRAMUSCULAR | Status: AC
  Administered 2015-03-31: 4 mg via INTRAVENOUS
  Filled 2015-03-30: qty 2

## 2015-03-30 NOTE — ED Notes (Signed)
Per EMS pt presents d/t near syncope.  Pt's HR was 140-150 in the field, EKG showed a. Fib w/ RVR and LBBB.  Pt reported to EMS that she has not felt well all day and the only thing she could keep down was grits.  Pt given 4 mg zofran and 250 ml NS given en route.

## 2015-03-30 NOTE — ED Provider Notes (Signed)
CSN: [id number]     Arrival date & time 03/30/15  2318 History  This chart was scribed for Linton Flemings, MD by Julien Nordmann, ED Scribe. This patient was seen in room RESA/RESA and the patient's care was started at 11:24 PM.    Chief Complaint  Patient presents with  . Near Syncope      The history is provided by the patient. No language interpreter was used.   HPI Comments: [name] is a 69 y.o. female brought in by ambulance, who presents to the Emergency Department complaining of intermittent, gradual worsening near syncope onset this evening. Pt reports she went to the bathroom and when she got up she felt near syncope. She notes having episodes of shortness of breath with associated nausea, loss of appetite, generalized weakness and fatigue for the past week. Pt states that when she lays down, her shortness of breath gets worse. Pt also states she started feeling heart palpitations intermittently for the past week. She reports she had a UTI about two weeks ago and treating herself with Azo, no antibiotics. She states she was having urinary frequency, dysuria, and swelling in her bilateral legs and abdomen. She denies dark stools.  Past Medical History  Diagnosis Date  . Diabetes mellitus without complication     dx on wed  . Mitral valve prolapse   . Atrial flutter    Past Surgical History  Procedure Laterality Date  . Dental surgery  Oct. 2011    several extractions, bone graft  . I&d extremity Left 01/06/2013    Procedure: IRRIGATION AND DEBRIDEMENT LEFT ELBOW AND LEFT FOREARM ;  Surgeon: [name], MD;  Location: [facility];  Service: Orthopedics;  Laterality: Left;  . Application of wound vac Left 01/06/2013    Procedure: APPLICATION OF WOUND VAC X 2;  Surgeon: [name], MD;  Location: [facility];  Service: Orthopedics;  Laterality: Left;  left forearm  . Incision and drainage Left 01/09/2013    Procedure: REDO INCISION AND DRAINAGE LEFT ELBOW;  Surgeon: [name], MD;  Location: [facility];  Service: Orthopedics;  Laterality: Left;  SUPINE, UPPER EXTERMITY DRAPE  . Secondary closure of wound Left 01/09/2013    Procedure: SECONDARY CLOSURE OF WOUND  LEFT ELBOW;  Surgeon: [name], MD;  Location: [facility];  Service: Orthopedics;  Laterality: Left;  . Application of wound vac Left 01/09/2013    Procedure: APPLICATION OF WOUND VAC;  Surgeon: [name], MD;  Location: [facility];  Service: Orthopedics;  Laterality: Left;   Family History  Problem Relation Age of Onset  . Cancer Mother   . Diabetes Father    Social History  Substance Use Topics  . Smoking status: Former Smoker -- 4 years    Quit date: 12/31/1980  . Smokeless tobacco: Never Used  . Alcohol Use: No   OB History    Gravida Para Term Preterm AB TAB SAB Ectopic Multiple Living            1     Review of Systems  Constitutional: Positive for appetite change.  Cardiovascular: Positive for leg swelling.  Gastrointestinal: Positive for nausea.  Neurological: Positive for weakness.  All other systems reviewed and are negative.     Allergies  Review of patient's allergies indicates no known allergies.  Home Medications   Prior to Admission medications   Medication Sig Start Date End Date Taking? Authorizing Provider  amiodarone (PACERONE) 200 MG tablet Take  1 tablet (200 mg total) by mouth 2 (two) times daily. 01/17/13 02/14/13  Velvet Bathe, MD  ceFAZolin (ANCEF) 2-3 GM-% SOLR Inject 50 mLs (2 g total) into the vein every 8 (eight) hours. 01/17/13   Velvet Bathe, MD  clotrimazole (GYNE-LOTRIMIN) 1 % vaginal cream Place 1 Applicatorful vaginally at bedtime. 01/17/13   Velvet Bathe, MD  docusate sodium 100 MG CAPS Take 100 mg by mouth 2 (two) times daily as needed. 01/17/13   Velvet Bathe, MD  ferrous sulfate 325 (65 FE) MG tablet Take 1 tablet (325 mg total) by mouth 3 (three) times daily after meals. 01/17/13   Velvet Bathe, MD  glipiZIDE (GLUCOTROL) 5 MG tablet Take 1 tablet (5 mg  total) by mouth daily before lunch. 01/17/13   Velvet Bathe, MD  HYDROcodone-acetaminophen (NORCO) 10-325 MG per tablet Take 1 tablet by mouth every 6 (six) hours as needed for pain. 01/17/13   Velvet Bathe, MD  methocarbamol (ROBAXIN) 500 MG tablet Take 1 tablet (500 mg total) by mouth every 6 (six) hours as needed. 01/17/13   Velvet Bathe, MD  metoprolol (LOPRESSOR) 50 MG tablet Take 1 tablet (50 mg total) by mouth 2 (two) times daily. 01/17/13   Velvet Bathe, MD  oxyCODONE (OXY IR/ROXICODONE) 5 MG immediate release tablet Take 1-2 tablets (5-10 mg total) by mouth every 3 (three) hours as needed. 01/17/13   Velvet Bathe, MD  Rivaroxaban (XARELTO) 15 MG TABS tablet Take 1 tablet (15 mg total) by mouth 2 (two) times daily with a meal. 01/17/13 01/30/13  Velvet Bathe, MD  Rivaroxaban (XARELTO) 20 MG TABS Take 1 tablet (20 mg total) by mouth daily with supper. 01/31/13   Velvet Bathe, MD  senna (SENOKOT) 8.6 MG TABS Take 1 tablet (8.6 mg total) by mouth daily as needed. 01/17/13   Velvet Bathe, MD   Ht 5\' 4"  (1.626 m)  Wt 175 lb (79.379 kg)  BMI 30.02 kg/m2 Physical Exam  Constitutional: She is oriented to person, place, and time. She appears well-developed and well-nourished. She appears distressed.  HENT:  Head: Normocephalic and atraumatic.  Nose: Nose normal.  Mouth/Throat: Oropharynx is clear and moist.  Eyes: Conjunctivae and EOM are normal. Pupils are equal, round, and reactive to light.  Patient has pale conjunctiva  Neck: Normal range of motion. Neck supple. No JVD present. No tracheal deviation present. No thyromegaly present.  Cardiovascular: Regular rhythm, normal heart sounds and intact distal pulses.  Exam reveals no gallop and no friction rub.   No murmur heard. Tachycardia with regular rhythm  Pulmonary/Chest: Effort normal and breath sounds normal. No stridor. No respiratory distress. She has no wheezes. She has no rales. She exhibits no tenderness.  Crackles in bases bilaterally   Abdominal: Soft. Bowel sounds are normal. She exhibits no distension and no mass. There is no tenderness. There is no rebound and no guarding.  Musculoskeletal: Normal range of motion. She exhibits edema (1edema bilaterally). She exhibits no tenderness.  1+ edema  Lymphadenopathy:    She has no cervical adenopathy.  Neurological: She is alert and oriented to person, place, and time. She displays normal reflexes. She exhibits normal muscle tone. Coordination normal.  Skin: Skin is warm and dry. No rash noted. No erythema. There is pallor.  Psychiatric: She has a normal mood and affect. Her behavior is normal. Judgment and thought content normal.  Nursing note and vitals reviewed.   ED Course  Procedures  COORDINATION OF CARE:  11:35 PM Discussed treatment plan with  pt at bedside and pt agreed to plan.  Labs Review Labs Reviewed  COMPREHENSIVE METABOLIC PANEL - Abnormal; Notable for the following:    CO2 20 (*)    Glucose, Bld 187 (*)    BUN 43 (*)    Calcium 8.5 (*)    Total Protein 6.1 (*)    GFR calc non Af Amer 58 (*)    All other components within normal limits  CBC WITH DIFFERENTIAL/PLATELET - Abnormal; Notable for the following:    Hemoglobin 11.2 (*)    RDW 16.0 (*)    All other components within normal limits  URINALYSIS, ROUTINE W REFLEX MICROSCOPIC (NOT AT [facility]) - Abnormal; Notable for the following:    Color, Urine RED (*)    APPearance TURBID (*)    Hgb urine dipstick LARGE (*)    Bilirubin Urine LARGE (*)    Ketones, ur 15 (*)    Protein, ur >300 (*)    Nitrite POSITIVE (*)    Leukocytes, UA LARGE (*)    All other components within normal limits  TROPONIN I - Abnormal; Notable for the following:    Troponin I 0.05 (*)    All other components within normal limits  BRAIN NATRIURETIC PEPTIDE - Abnormal; Notable for the following:    B Natriuretic Peptide 630.5 (*)    All other components within normal limits  PROTIME-INR - Abnormal; Notable for the following:     Prothrombin Time 16.3 (*)    All other components within normal limits  URINE MICROSCOPIC-ADD ON - Abnormal; Notable for the following:    Bacteria, UA MANY (*)    All other components within normal limits  PROTIME-INR - Abnormal; Notable for the following:    Prothrombin Time 15.7 (*)    All other components within normal limits  APTT - Abnormal; Notable for the following:    aPTT 22 (*)    All other components within normal limits  TROPONIN I - Abnormal; Notable for the following:    Troponin I 0.07 (*)    All other components within normal limits  BASIC METABOLIC PANEL - Abnormal; Notable for the following:    CO2 20 (*)    Glucose, Bld 155 (*)    BUN 42 (*)    Creatinine, Ser 1.04 (*)    Calcium 8.3 (*)    GFR calc non Af Amer 54 (*)    All other components within normal limits  CBC - Abnormal; Notable for the following:    WBC 10.8 (*)    Hemoglobin 11.0 (*)    HCT 35.6 (*)    RDW 16.0 (*)    All other components within normal limits  GLUCOSE, CAPILLARY - Abnormal; Notable for the following:    Glucose-Capillary 145 (*)    All other components within normal limits  I-STAT CHEM 8, ED - Abnormal; Notable for the following:    BUN 39 (*)    Glucose, Bld 186 (*)    Calcium, Ion 1.12 (*)    All other components within normal limits  URINE CULTURE  CULTURE, BLOOD (ROUTINE X 2)  CULTURE, BLOOD (ROUTINE X 2)  MRSA PCR SCREENING  LACTIC ACID, PLASMA  LACTIC ACID, PLASMA  PROCALCITONIN  HEMOGLOBIN A1C  TROPONIN I  TROPONIN I  HEPARIN LEVEL (UNFRACTIONATED)  POC OCCULT BLOOD, ED  I-STAT CG4 LACTIC ACID, ED  I-STAT CG4 LACTIC ACID, ED  TYPE AND SCREEN    Imaging Review Dg Chest Port 1 View  03/31/2015  CLINICAL DATA:  Weakness and shortness of breath. Near syncope. Bilateral lower extremity swelling.  EXAM: PORTABLE CHEST - 1 VIEW  COMPARISON:  01/01/2013  FINDINGS: Cardiomegaly is again seen. There is progressive interstitial and perihilar opacities concerning for  pulmonary edema. Mild atelectasis at the right lung base. No confluent airspace disease. Questionable blunting of left costophrenic angle. No pneumothorax.  IMPRESSION: Findings suspicious for CHF.   Electronically Signed   By: Jeb Levering M.D.   On: 03/31/2015 01:21   I have personally reviewed and evaluated these images and lab results as part of my medical decision-making.   EKG Interpretation   Date/Time:  Saturday March 31 2015 00:31:54 EDT Ventricular Rate:  96 PR Interval:    QRS Duration: 122 QT Interval:  420 QTC Calculation: 531 R Axis:   108 Text Interpretation:  Atrial fibrillation Nonspecific intraventricular  conduction delay Probable lateral infarct, age indeterminate Anteroseptal  infarct, old Baseline wander in lead(s) V5 new afib, rate improved from  prior ekg earlier tonight Confirmed by Daquana Paddock  MD, Candid Bovey ([id number]) on  03/31/2015 12:37:35 AM     CRITICAL CARE Performed by: Kalman Drape Total critical care time: 60 min Critical care time was exclusive of separately billable procedures and treating other patients. Critical care was necessary to treat or prevent imminent or life-threatening deterioration. Critical care was time spent personally by me on the following activities: development of treatment plan with patient and/or surrogate as well as nursing, discussions with consultants, evaluation of patient's response to treatment, examination of patient, obtaining history from patient or surrogate, ordering and performing treatments and interventions, ordering and review of laboratory studies, ordering and review of radiographic studies, pulse oximetry and re-evaluation of patient's condition.  MDM   Final diagnoses:  Near syncope  UTI (lower urinary tract infection)  Atrial fibrillation, unspecified  Hypertrophic cardiomyopathy  Congestive heart failure, unspecified congestive heart failure chronicity, unspecified congestive heart failure type    I personally  performed the services described in this documentation, which was scribed in my presence. The recorded information has been reviewed and is accurate.  69 year old female presents to emergency department after near syncopal episode, patient has had generalized weakness, fatigue, shortness of breath over the last week.  Self treated recently for UTI sxs with azo.  Patient currently not taking any medications, has not seen a doctor in 2 years.  Patient appears pale, is tachycardic.  EKG with tachycardia, wide complex.  Concern for anemia causing symptoms.  Patient gives history of cardiomyopathy, which is gone untreated for last 2 years.  No signs of anemia on labs.  Patient heart rate has improved with IV fluids, now showing A. fib.  Urine with significant infection.  Plan for treatment with Rocephin, admission to the hospital.  Case was discussed with hospitalist.   Linton Flemings, MD 03/31/15 [phone]

## 2015-03-31 ENCOUNTER — Emergency Department (HOSPITAL_COMMUNITY): Payer: Medicare Other

## 2015-03-31 ENCOUNTER — Inpatient Hospital Stay (HOSPITAL_BASED_OUTPATIENT_CLINIC_OR_DEPARTMENT_OTHER): Payer: Medicare Other

## 2015-03-31 DIAGNOSIS — I422 Other hypertrophic cardiomyopathy: Secondary | ICD-10-CM | POA: Insufficient documentation

## 2015-03-31 DIAGNOSIS — I509 Heart failure, unspecified: Secondary | ICD-10-CM | POA: Insufficient documentation

## 2015-03-31 DIAGNOSIS — A419 Sepsis, unspecified organism: Secondary | ICD-10-CM | POA: Diagnosis present

## 2015-03-31 DIAGNOSIS — R0602 Shortness of breath: Secondary | ICD-10-CM | POA: Diagnosis not present

## 2015-03-31 DIAGNOSIS — Z87891 Personal history of nicotine dependence: Secondary | ICD-10-CM | POA: Diagnosis not present

## 2015-03-31 DIAGNOSIS — I5020 Unspecified systolic (congestive) heart failure: Secondary | ICD-10-CM | POA: Insufficient documentation

## 2015-03-31 DIAGNOSIS — I4891 Unspecified atrial fibrillation: Secondary | ICD-10-CM | POA: Diagnosis present

## 2015-03-31 DIAGNOSIS — B961 Klebsiella pneumoniae [K. pneumoniae] as the cause of diseases classified elsewhere: Secondary | ICD-10-CM | POA: Diagnosis present

## 2015-03-31 DIAGNOSIS — E119 Type 2 diabetes mellitus without complications: Secondary | ICD-10-CM | POA: Diagnosis not present

## 2015-03-31 DIAGNOSIS — Z9114 Patient's other noncompliance with medication regimen: Secondary | ICD-10-CM | POA: Diagnosis present

## 2015-03-31 DIAGNOSIS — I9589 Other hypotension: Secondary | ICD-10-CM | POA: Diagnosis not present

## 2015-03-31 DIAGNOSIS — E669 Obesity, unspecified: Secondary | ICD-10-CM | POA: Diagnosis present

## 2015-03-31 DIAGNOSIS — I959 Hypotension, unspecified: Secondary | ICD-10-CM | POA: Diagnosis present

## 2015-03-31 DIAGNOSIS — I5033 Acute on chronic diastolic (congestive) heart failure: Secondary | ICD-10-CM | POA: Diagnosis present

## 2015-03-31 DIAGNOSIS — N390 Urinary tract infection, site not specified: Secondary | ICD-10-CM | POA: Diagnosis not present

## 2015-03-31 DIAGNOSIS — I480 Paroxysmal atrial fibrillation: Secondary | ICD-10-CM | POA: Diagnosis present

## 2015-03-31 DIAGNOSIS — I248 Other forms of acute ischemic heart disease: Secondary | ICD-10-CM | POA: Diagnosis present

## 2015-03-31 DIAGNOSIS — R55 Syncope and collapse: Secondary | ICD-10-CM | POA: Diagnosis not present

## 2015-03-31 DIAGNOSIS — Z809 Family history of malignant neoplasm, unspecified: Secondary | ICD-10-CM | POA: Diagnosis not present

## 2015-03-31 DIAGNOSIS — Z833 Family history of diabetes mellitus: Secondary | ICD-10-CM | POA: Diagnosis not present

## 2015-03-31 LAB — CBC
HCT: 35.6 % — ABNORMAL LOW (ref 36.0–46.0)
Hemoglobin: 11 g/dL — ABNORMAL LOW (ref 12.0–15.0)
MCH: 27.7 pg (ref 26.0–34.0)
MCHC: 30.9 g/dL (ref 30.0–36.0)
MCV: 89.7 fL (ref 78.0–100.0)
PLATELETS: 330 10*3/uL (ref 150–400)
RBC: 3.97 MIL/uL (ref 3.87–5.11)
RDW: 16 % — ABNORMAL HIGH (ref 11.5–15.5)
WBC: 10.8 10*3/uL — ABNORMAL HIGH (ref 4.0–10.5)

## 2015-03-31 LAB — TYPE AND SCREEN
ABO/RH(D): A NEG
ANTIBODY SCREEN: NEGATIVE

## 2015-03-31 LAB — URINE MICROSCOPIC-ADD ON

## 2015-03-31 LAB — CBC WITH DIFFERENTIAL/PLATELET
BASOS ABS: 0 10*3/uL (ref 0.0–0.1)
BASOS PCT: 0 % (ref 0–1)
Eosinophils Absolute: 0.5 10*3/uL (ref 0.0–0.7)
Eosinophils Relative: 5 % (ref 0–5)
HEMATOCRIT: 37.1 % (ref 36.0–46.0)
HEMOGLOBIN: 11.2 g/dL — AB (ref 12.0–15.0)
LYMPHS PCT: 18 % (ref 12–46)
Lymphs Abs: 1.9 10*3/uL (ref 0.7–4.0)
MCH: 26.9 pg (ref 26.0–34.0)
MCHC: 30.2 g/dL (ref 30.0–36.0)
MCV: 89 fL (ref 78.0–100.0)
MONO ABS: 0.6 10*3/uL (ref 0.1–1.0)
Monocytes Relative: 6 % (ref 3–12)
NEUTROS ABS: 7.4 10*3/uL (ref 1.7–7.7)
NEUTROS PCT: 71 % (ref 43–77)
Platelets: 344 10*3/uL (ref 150–400)
RBC: 4.17 MIL/uL (ref 3.87–5.11)
RDW: 16 % — AB (ref 11.5–15.5)
WBC: 10.4 10*3/uL (ref 4.0–10.5)

## 2015-03-31 LAB — URINALYSIS, ROUTINE W REFLEX MICROSCOPIC
Glucose, UA: NEGATIVE mg/dL
KETONES UR: 15 mg/dL — AB
NITRITE: POSITIVE — AB
PH: 5.5 (ref 5.0–8.0)
Specific Gravity, Urine: 1.028 (ref 1.005–1.030)
UROBILINOGEN UA: 1 mg/dL (ref 0.0–1.0)

## 2015-03-31 LAB — COMPREHENSIVE METABOLIC PANEL
ALBUMIN: 3.5 g/dL (ref 3.5–5.0)
ALK PHOS: 124 U/L (ref 38–126)
ALT: 48 U/L (ref 14–54)
ANION GAP: 10 (ref 5–15)
AST: 34 U/L (ref 15–41)
BILIRUBIN TOTAL: 0.7 mg/dL (ref 0.3–1.2)
BUN: 43 mg/dL — ABNORMAL HIGH (ref 6–20)
CALCIUM: 8.5 mg/dL — AB (ref 8.9–10.3)
CO2: 20 mmol/L — ABNORMAL LOW (ref 22–32)
Chloride: 111 mmol/L (ref 101–111)
Creatinine, Ser: 0.99 mg/dL (ref 0.44–1.00)
GFR calc non Af Amer: 58 mL/min — ABNORMAL LOW (ref >60)
GLUCOSE: 187 mg/dL — AB (ref 65–99)
POTASSIUM: 3.7 mmol/L (ref 3.5–5.1)
SODIUM: 141 mmol/L (ref 135–145)
TOTAL PROTEIN: 6.1 g/dL — AB (ref 6.5–8.1)

## 2015-03-31 LAB — GLUCOSE, CAPILLARY
GLUCOSE-CAPILLARY: 139 mg/dL — AB (ref 65–99)
GLUCOSE-CAPILLARY: 135 mg/dL — AB (ref 65–99)
Glucose-Capillary: 145 mg/dL — ABNORMAL HIGH (ref 65–99)
Glucose-Capillary: 172 mg/dL — ABNORMAL HIGH (ref 65–99)

## 2015-03-31 LAB — BASIC METABOLIC PANEL
Anion gap: 9 (ref 5–15)
BUN: 42 mg/dL — ABNORMAL HIGH (ref 6–20)
CHLORIDE: 111 mmol/L (ref 101–111)
CO2: 20 mmol/L — AB (ref 22–32)
CREATININE: 1.04 mg/dL — AB (ref 0.44–1.00)
Calcium: 8.3 mg/dL — ABNORMAL LOW (ref 8.9–10.3)
GFR calc non Af Amer: 54 mL/min — ABNORMAL LOW (ref >60)
GLUCOSE: 155 mg/dL — AB (ref 65–99)
Potassium: 4.4 mmol/L (ref 3.5–5.1)
Sodium: 140 mmol/L (ref 135–145)

## 2015-03-31 LAB — I-STAT CHEM 8, ED
BUN: 39 mg/dL — ABNORMAL HIGH (ref 6–20)
CREATININE: 0.9 mg/dL (ref 0.44–1.00)
Calcium, Ion: 1.12 mmol/L — ABNORMAL LOW (ref 1.13–1.30)
Chloride: 109 mmol/L (ref 101–111)
Glucose, Bld: 186 mg/dL — ABNORMAL HIGH (ref 65–99)
HEMATOCRIT: 36 % (ref 36.0–46.0)
HEMOGLOBIN: 12.2 g/dL (ref 12.0–15.0)
POTASSIUM: 3.6 mmol/L (ref 3.5–5.1)
SODIUM: 142 mmol/L (ref 135–145)
TCO2: 18 mmol/L (ref 0–100)

## 2015-03-31 LAB — BRAIN NATRIURETIC PEPTIDE: B NATRIURETIC PEPTIDE 5: 630.5 pg/mL — AB (ref 0.0–100.0)

## 2015-03-31 LAB — I-STAT CG4 LACTIC ACID, ED: LACTIC ACID, VENOUS: 1.38 mmol/L (ref 0.5–2.0)

## 2015-03-31 LAB — PROTIME-INR
INR: 1.24 (ref 0.00–1.49)
INR: 1.3 (ref 0.00–1.49)
PROTHROMBIN TIME: 16.3 s — AB (ref 11.6–15.2)
Prothrombin Time: 15.7 seconds — ABNORMAL HIGH (ref 11.6–15.2)

## 2015-03-31 LAB — TROPONIN I
Troponin I: 0.05 ng/mL — ABNORMAL HIGH (ref <0.031)
Troponin I: 0.07 ng/mL — ABNORMAL HIGH (ref <0.031)
Troponin I: 0.17 ng/mL — ABNORMAL HIGH (ref <0.031)
Troponin I: 0.19 ng/mL — ABNORMAL HIGH (ref <0.031)

## 2015-03-31 LAB — LACTIC ACID, PLASMA
LACTIC ACID, VENOUS: 1.3 mmol/L (ref 0.5–2.0)
LACTIC ACID, VENOUS: 1.2 mmol/L (ref 0.5–2.0)

## 2015-03-31 LAB — HEPARIN LEVEL (UNFRACTIONATED)
HEPARIN UNFRACTIONATED: 0.41 [IU]/mL (ref 0.30–0.70)
Heparin Unfractionated: 0.37 IU/mL (ref 0.30–0.70)

## 2015-03-31 LAB — POC OCCULT BLOOD, ED: FECAL OCCULT BLD: NEGATIVE

## 2015-03-31 LAB — MRSA PCR SCREENING: MRSA by PCR: NEGATIVE

## 2015-03-31 LAB — PROCALCITONIN: Procalcitonin: <0.1 ng/mL

## 2015-03-31 LAB — APTT: aPTT: 22 seconds — ABNORMAL LOW (ref 24–37)

## 2015-03-31 MED ORDER — HEPARIN BOLUS VIA INFUSION
3500.0000 [IU] | Freq: Once | INTRAVENOUS | Status: AC
  Administered 2015-03-31: 3500 [IU] via INTRAVENOUS
  Filled 2015-03-31: qty 3500

## 2015-03-31 MED ORDER — SODIUM CHLORIDE 0.9 % IV SOLN: 250.0000 mL | INTRAVENOUS | Status: DC | PRN

## 2015-03-31 MED ORDER — HYDROMORPHONE HCL 1 MG/ML IJ SOLN: 0.5000 mg | INTRAMUSCULAR | Status: DC | PRN

## 2015-03-31 MED ORDER — INSULIN ASPART 100 UNIT/ML ~~LOC~~ SOLN
0.0000 [IU] | Freq: Every day | SUBCUTANEOUS | Status: DC
  Administered 2015-04-02: 2 [IU] via SUBCUTANEOUS

## 2015-03-31 MED ORDER — DEXTROSE 5 % IV SOLN
1.0000 g | INTRAVENOUS | Status: DC
  Administered 2015-04-01 – 2015-04-03 (×3): 1 g via INTRAVENOUS
  Filled 2015-03-31 (×3): qty 10

## 2015-03-31 MED ORDER — ONDANSETRON HCL 4 MG/2ML IJ SOLN: 4.0000 mg | Freq: Four times a day (QID) | INTRAMUSCULAR | Status: DC | PRN

## 2015-03-31 MED ORDER — ALUM & MAG HYDROXIDE-SIMETH 200-200-20 MG/5ML PO SUSP: 30.0000 mL | Freq: Four times a day (QID) | ORAL | Status: DC | PRN

## 2015-03-31 MED ORDER — DEXTROSE 5 % IV SOLN
1.0000 g | Freq: Once | INTRAVENOUS | Status: AC
  Administered 2015-03-31: 1 g via INTRAVENOUS
  Filled 2015-03-31: qty 10

## 2015-03-31 MED ORDER — CARVEDILOL 3.125 MG PO TABS
3.1250 mg | ORAL_TABLET | Freq: Two times a day (BID) | ORAL | Status: DC
  Administered 2015-03-31 – 2015-04-01 (×3): 3.125 mg via ORAL
  Filled 2015-03-31 (×3): qty 1

## 2015-03-31 MED ORDER — ACETAMINOPHEN 650 MG RE SUPP: 650.0000 mg | Freq: Four times a day (QID) | RECTAL | Status: DC | PRN

## 2015-03-31 MED ORDER — SODIUM CHLORIDE 0.9 % IJ SOLN: 3.0000 mL | INTRAMUSCULAR | Status: DC | PRN

## 2015-03-31 MED ORDER — ONDANSETRON HCL 4 MG PO TABS: 4.0000 mg | ORAL_TABLET | Freq: Four times a day (QID) | ORAL | Status: DC | PRN

## 2015-03-31 MED ORDER — INSULIN ASPART 100 UNIT/ML ~~LOC~~ SOLN
0.0000 [IU] | Freq: Three times a day (TID) | SUBCUTANEOUS | Status: DC
  Administered 2015-03-31 – 2015-04-01 (×5): 1 [IU] via SUBCUTANEOUS
  Administered 2015-04-02: 2 [IU] via SUBCUTANEOUS
  Administered 2015-04-03 – 2015-04-04 (×3): 1 [IU] via SUBCUTANEOUS
  Administered 2015-04-04: 2 [IU] via SUBCUTANEOUS
  Administered 2015-04-04: 1 [IU] via SUBCUTANEOUS
  Administered 2015-04-05: 2 [IU] via SUBCUTANEOUS
  Administered 2015-04-05: 1 [IU] via SUBCUTANEOUS
  Administered 2015-04-05: 2 [IU] via SUBCUTANEOUS

## 2015-03-31 MED ORDER — SODIUM CHLORIDE 0.9 % IJ SOLN
3.0000 mL | Freq: Two times a day (BID) | INTRAMUSCULAR | Status: DC
  Administered 2015-03-31 – 2015-04-04 (×9): 3 mL via INTRAVENOUS

## 2015-03-31 MED ORDER — ACETAMINOPHEN 325 MG PO TABS
650.0000 mg | ORAL_TABLET | Freq: Four times a day (QID) | ORAL | Status: DC | PRN
  Administered 2015-04-01: 650 mg via ORAL
  Filled 2015-03-31: qty 2

## 2015-03-31 MED ORDER — METOPROLOL TARTRATE 1 MG/ML IV SOLN: 2.5000 mg | Freq: Four times a day (QID) | INTRAVENOUS | Status: DC | PRN

## 2015-03-31 MED ORDER — HEPARIN (PORCINE) IN NACL 100-0.45 UNIT/ML-% IJ SOLN
1000.0000 [IU]/h | INTRAMUSCULAR | Status: AC
  Administered 2015-03-31 – 2015-04-02 (×3): 1000 [IU]/h via INTRAVENOUS
  Filled 2015-03-31 (×3): qty 250

## 2015-03-31 MED ORDER — FUROSEMIDE 10 MG/ML IJ SOLN
20.0000 mg | Freq: Once | INTRAMUSCULAR | Status: AC
  Administered 2015-03-31: 20 mg via INTRAVENOUS
  Filled 2015-03-31: qty 2

## 2015-03-31 MED ORDER — OXYCODONE HCL 5 MG PO TABS: 5.0000 mg | ORAL_TABLET | ORAL | Status: DC | PRN

## 2015-03-31 NOTE — H&P (Signed)
[facility] Admission History and Physical       [name] KZS:[id number] DOB: 02/29/1946 DOA: 03/30/2015  Referring physician: EDP PCP: Lujean Amel, MD  Specialists:   Chief Complaint: Felt Faint  HPI: [name] is a 69 y.o. female with a history of Atrial fibrillation, DM2, HCOM, and MVP who present to the ED with complaints of feeling as if she would faint when she got up to go to the bathroom tonight.  She  Reports that she has been feeling weak and nauseous and has had chills and palpitations for the last few days.  She denied any chest pain.  2 weeks ago she reports that she took OTC AZO to treat a self diagnosed UTI.   In the ED she was found to have a UTI, and was hypothermic and hypotension as well as found to be in Atrial fibrillation with RVR and rate in the 140's.   She was administered IV Fluids which improved her heart rate and hypotension, and was placed on IV Rocephin.   She was referred for admission.   She had been on Amiodarone and Xarelto RX in 2014, but reports she stopped taking it.     Review of Systems:  Constitutional: No Weight Loss, No Weight Gain, Night Sweats, Fevers, Chills, Dizziness, +Light Headedness, Fatigue, or Generalized Weakness HEENT: No Headaches, Difficulty Swallowing,Tooth/Dental Problems,Sore Throat,  No Sneezing, Rhinitis, Ear Ache, Nasal Congestion, or Post Nasal Drip,  Cardio-vascular:  No Chest pain, Orthopnea, PND, Edema in Lower Extremities, Anasarca, Dizziness, +Palpitations  Resp: No Dyspnea, No DOE, No Productive Cough, No Non-Productive Cough, No Hemoptysis, No Wheezing.    GI: No Heartburn, Indigestion, Abdominal Pain, Nausea, Vomiting, Diarrhea, Constipation, Hematemesis, Hematochezia, Melena, Change in Bowel Habits,  Loss of Appetite  GU: No Dysuria, No Change in Color of Urine, No Urgency or Urinary Frequency, No Flank pain.  Musculoskeletal: No Joint Pain or Swelling, No Decreased Range of Motion, No Back Pain.    Neurologic: +Near Syncope, No Seizures, Muscle Weakness, Paresthesia, Vision Disturbance or Loss, No Diplopia, No Vertigo, No Difficulty Walking,  Skin: No Rash or Lesions. Psych: No Change in Mood or Affect, No Depression or Anxiety, No Memory loss, No Confusion, or Hallucinations   Past Medical History  Diagnosis Date  . Diabetes mellitus without complication     dx on wed  . Mitral valve prolapse   . Atrial flutter      Past Surgical History  Procedure Laterality Date  . Dental surgery  Oct. 2011    several extractions, bone graft  . I&d extremity Left 01/06/2013    Procedure: IRRIGATION AND DEBRIDEMENT LEFT ELBOW AND LEFT FOREARM ;  Surgeon: [name], MD;  Location: [facility];  Service: Orthopedics;  Laterality: Left;  . Application of wound vac Left 01/06/2013    Procedure: APPLICATION OF WOUND VAC X 2;  Surgeon: [name], MD;  Location: [facility];  Service: Orthopedics;  Laterality: Left;  left forearm  . Incision and drainage Left 01/09/2013    Procedure: REDO INCISION AND DRAINAGE LEFT ELBOW;  Surgeon: [name], MD;  Location: [facility];  Service: Orthopedics;  Laterality: Left;  SUPINE, UPPER EXTERMITY DRAPE  . Secondary closure of wound Left 01/09/2013    Procedure: SECONDARY CLOSURE OF WOUND  LEFT ELBOW;  Surgeon: [name], MD;  Location: [facility];  Service: Orthopedics;  Laterality: Left;  . Application of wound vac Left 01/09/2013    Procedure: APPLICATION OF WOUND VAC;  Surgeon: [name], MD;  Location: [facility];  Service: Orthopedics;  Laterality: Left;      Prior to Admission medications   Medication Sig Start Date End Date Taking? Authorizing Provider  amiodarone (PACERONE) 200 MG tablet Take 1 tablet (200 mg total) by mouth 2 (two) times daily. 01/17/13 02/14/13  Velvet Bathe, MD  ceFAZolin (ANCEF) 2-3 GM-% SOLR Inject 50 mLs (2 g total) into the vein every 8 (eight) hours. 01/17/13   Velvet Bathe, MD  clotrimazole (GYNE-LOTRIMIN) 1 % vaginal cream  Place 1 Applicatorful vaginally at bedtime. 01/17/13   Velvet Bathe, MD  docusate sodium 100 MG CAPS Take 100 mg by mouth 2 (two) times daily as needed. 01/17/13   Velvet Bathe, MD  ferrous sulfate 325 (65 FE) MG tablet Take 1 tablet (325 mg total) by mouth 3 (three) times daily after meals. 01/17/13   Velvet Bathe, MD  glipiZIDE (GLUCOTROL) 5 MG tablet Take 1 tablet (5 mg total) by mouth daily before lunch. 01/17/13   Velvet Bathe, MD  HYDROcodone-acetaminophen (NORCO) 10-325 MG per tablet Take 1 tablet by mouth every 6 (six) hours as needed for pain. 01/17/13   Velvet Bathe, MD  methocarbamol (ROBAXIN) 500 MG tablet Take 1 tablet (500 mg total) by mouth every 6 (six) hours as needed. 01/17/13   Velvet Bathe, MD  metoprolol (LOPRESSOR) 50 MG tablet Take 1 tablet (50 mg total) by mouth 2 (two) times daily. 01/17/13   Velvet Bathe, MD  oxyCODONE (OXY IR/ROXICODONE) 5 MG immediate release tablet Take 1-2 tablets (5-10 mg total) by mouth every 3 (three) hours as needed. 01/17/13   Velvet Bathe, MD  Rivaroxaban (XARELTO) 15 MG TABS tablet Take 1 tablet (15 mg total) by mouth 2 (two) times daily with a meal. 01/17/13 01/30/13  Velvet Bathe, MD  Rivaroxaban (XARELTO) 20 MG TABS Take 1 tablet (20 mg total) by mouth daily with supper. 01/31/13   Velvet Bathe, MD  senna (SENOKOT) 8.6 MG TABS Take 1 tablet (8.6 mg total) by mouth daily as needed. 01/17/13   Velvet Bathe, MD     No Known Allergies    Social History:  reports that she quit smoking about 34 years ago. She has never used smokeless tobacco. She reports that she does not drink alcohol or use illicit drugs.     Family History  Problem Relation Age of Onset  . Cancer Mother   . Diabetes Father        Physical Exam:  GEN:  Pleasant Elderly Well Nourished and Well Developed 69 y.o. Caucasian female examined and in no acute distress; cooperative with exam Filed Vitals:   03/31/15 0130 03/31/15 0145 03/31/15 0148 03/31/15 0200  BP: 107/94 137/83  137/83 100/60  Pulse: 103 96 93 93  Temp:      TempSrc:      Resp: 23 21 19 21   Height:      Weight:      SpO2: 98% 99% 99% 99%   Blood pressure 100/60, pulse 93, temperature 95.6 F (35.3 C), temperature source Rectal, resp. rate 21, height 5\' 4"  (1.626 m), weight 79.379 kg (175 lb), SpO2 99 %. PSYCH: She is alert and oriented x4; does not appear anxious does not appear depressed; affect is normal HEENT: Normocephalic and Atraumatic, Mucous membranes pink; PERRLA; EOM intact; Fundi:  Benign;  No scleral icterus, Nares: Patent, Oropharynx: Clear, Fair Dentition,    Neck:  FROM, No Cervical Lymphadenopathy nor Thyromegaly or Carotid Bruit; No JVD; Breasts::  Not examined CHEST WALL: No tenderness CHEST: Normal respiration, clear to auscultation bilaterally HEART: Irregular rate and rhythm; no murmurs rubs or gallops BACK: No kyphosis or scoliosis; No CVA tenderness ABDOMEN: Positive Bowel Sounds, Soft Non-Tender, No Rebound or Guarding; No Masses, No Organomegaly. Rectal Exam: Not done EXTREMITIES: No Cyanosis, Clubbing, or Edema; No Ulcerations. Genitalia: not examined PULSES: 2+ and symmetric SKIN: Normal hydration no rash or ulceration CNS:  Alert and Oriented x 4, No Focal Deficits Vascular: pulses palpable throughout    Labs on Admission:  Basic Metabolic Panel:  Recent Labs Lab 03/30/15 2344 03/31/15 0007  NA 141 142  K 3.7 3.6  CL 111 109  CO2 20*  --   GLUCOSE 187* 186*  BUN 43* 39*  CREATININE 0.99 0.90  CALCIUM 8.5*  --    Liver Function Tests:  Recent Labs Lab 03/30/15 2344  AST 34  ALT 48  ALKPHOS 124  BILITOT 0.7  PROT 6.1*  ALBUMIN 3.5   No results for input(s): LIPASE, AMYLASE in the last 168 hours. No results for input(s): AMMONIA in the last 168 hours. CBC:  Recent Labs Lab 03/30/15 2344 03/31/15 0007  WBC 10.4  --   NEUTROABS 7.4  --   HGB 11.2* 12.2  HCT 37.1 36.0  MCV 89.0  --   PLT 344  --    Cardiac Enzymes:  Recent  Labs Lab 03/30/15 2344  TROPONINI 0.05*    BNP (last 3 results)  Recent Labs  03/30/15 2344  BNP 630.5*    ProBNP (last 3 results) No results for input(s): PROBNP in the last 8760 hours.  CBG: No results for input(s): GLUCAP in the last 168 hours.  Radiological Exams on Admission: Dg Chest Port 1 View  03/31/2015   CLINICAL DATA:  Weakness and shortness of breath. Near syncope. Bilateral lower extremity swelling.  EXAM: PORTABLE CHEST - 1 VIEW  COMPARISON:  01/01/2013  FINDINGS: Cardiomegaly is again seen. There is progressive interstitial and perihilar opacities concerning for pulmonary edema. Mild atelectasis at the right lung base. No confluent airspace disease. Questionable blunting of left costophrenic angle. No pneumothorax.  IMPRESSION: Findings suspicious for CHF.   Electronically Signed   By: Jeb Levering M.D.   On: 03/31/2015 01:21     EKG: Independently reviewed. Atrial fibrillation with RVR rate =141   Assessment/Plan:   69 y.o. female with  Principal Problem:   1.    Sepsis/ UTI (lower urinary tract infection)   IV Rocephin   IVFs   Active Problems:   2.    Near syncope   Due to #1, and #3, and #4     3.    Hypotension- due to #1   IVFs given for Fluid Resuscitation     4.    Atrial fibrillation with rapid ventricular response- Rate improved with IVFs    -In 2014 was on Amiodarone and Xarelto of which she stopped taking   Cardiac Monitoring   Will start an IV Diltiazem drip if needed       5.    HCOM/Cardiomyopathy   Monitor for Overt Signs of Fluid Overload since she is receiving fluids for Hypotension     6.    Diabetes mellitus type 2, noninsulin dependent   SSI coverage PRN   Check HbA1C in AM    7.    Noncompliance   Counselled     8.    DVT Prophylaxis   IV Heparin drip  Code Status:     FULL CODE        Family Communication: No Family Present    Disposition Plan:    Inpatient  Observation Status        Time  spent:  30 Minutes      Theressa Millard [facility] Pager [phone]   If [place] Please Contact the Day Rounding Team MD for [facility]  If 7PM-7AM, Please Contact Night-Floor Coverage  [email or web address] Password [email or web address] 03/31/2015, 2:25 AM     ADDENDUM:   Patient was seen and examined on 03/31/2015

## 2015-03-31 NOTE — ED Notes (Signed)
Dr Sharol Given aware of blood pressure and came to bedside to assess pt again,  Another ekg performed and given to Dr Sharol Given,

## 2015-03-31 NOTE — Progress Notes (Signed)
ANTICOAGULATION CONSULT NOTE - Follow Up Consult  Pharmacy Consult for Heparin Indication: atrial fibrillation  No Known Allergies  Patient Measurements: Height: 5\' 4"  (162.6 cm) Weight: 181 lb 3.5 oz (82.2 kg) IBW/kg (Calculated) : 54.7 Heparin Dosing Weight: 71 kg  Vital Signs: Temp: 98.2 F (36.8 C) (08/27 1156) Temp Source: Oral (08/27 0750) BP: 118/87 mmHg (08/27 1000) Pulse Rate: 121 (08/27 1000)  Labs:  Recent Labs  03/30/15 2344 03/31/15 0007 03/31/15 0259 03/31/15 0653 03/31/15 1034  HGB 11.2* 12.2  --  11.0*  --   HCT 37.1 36.0  --  35.6*  --   PLT 344  --   --  330  --   APTT  --   --  22*  --   --   LABPROT 16.3*  --  15.7*  --   --   INR 1.30  --  1.24  --   --   HEPARINUNFRC  --   --   --   --  0.37  CREATININE 0.99 0.90  --  1.04*  --   TROPONINI 0.05*  --  0.07*  --  0.17*    Estimated Creatinine Clearance: 54.4 mL/min (by C-G formula based on Cr of 1.04).   Medications:  Scheduled:  . carvedilol  3.125 mg Oral BID WC  . [START ON 04/01/2015] cefTRIAXone (ROCEPHIN)  IV  1 g Intravenous Q24H  . insulin aspart  0-5 Units Subcutaneous QHS  . insulin aspart  0-9 Units Subcutaneous TID WC  . sodium chloride  3 mL Intravenous Q12H   Infusions:  . heparin 1,000 Units/hr (03/31/15 0500)   PRN: sodium chloride, acetaminophen **OR** acetaminophen, alum & mag hydroxide-simeth, HYDROmorphone (DILAUDID) injection, metoprolol, ondansetron **OR** ondansetron (ZOFRAN) IV, oxyCODONE, sodium chloride  Assessment: 69 yo female with c/o feeling faint, found to be in Afib with RVR. Patient has taken Amiodarone and Xarelto in the past (stopped taking in 2014). Pharmacy consulted to start IV Heparin.  03/31/2015  First heparin level therapeutic (0.37) on 1000 units/hr  CBC stable, no bleeding reported  Goal of Therapy:  Heparin level 0.3-0.7 units/ml Monitor platelets by anticoagulation protocol: Yes   Plan:   Continue heparin at current rate  Recheck  heparin level in 6 hours to verify therapeutic  Peggyann Juba, PharmD, BCPS Pager: [phone] 03/31/2015,11:57 AM

## 2015-03-31 NOTE — Progress Notes (Addendum)
Bladder scan done at 2045 due to pt's inability to void after last in and out cath at 1330. 276mL urine in bladder. Pt not uncomfortable and requesting to try to void again. Will continue to monitor q hour. Patient walked to bathroom to try to void, unsuccessful. Bladder scan shows 280 in bladder. MD paged.

## 2015-03-31 NOTE — Progress Notes (Signed)
Bladder scan performed due to inability to void. Bladder scan showed 403 ml. In and out cath done and returned 400 ml of dark amber urine.

## 2015-03-31 NOTE — Progress Notes (Signed)
  Echocardiogram 2D Echocardiogram has been performed.  Lysle Rubens 03/31/2015, 10:31 AM

## 2015-03-31 NOTE — ED Notes (Signed)
Floor to call back for report,  Pt is resting without any problems,  Blanket warmer turned off,  Temperature is within normal range

## 2015-03-31 NOTE — ED Notes (Signed)
[name] (son) [phone] call if needed anytime

## 2015-03-31 NOTE — ED Notes (Signed)
82/62 manual b/p

## 2015-03-31 NOTE — Progress Notes (Addendum)
Patient seen and examined this morning. She has a history of A fib, DM, HCOM and MVP who came to the ED and was admitted overnight 8/26-8/27 with A fib with RVR, weakness in the setting of a UTI.    A fib with RVR - improved overnight with antibiotics and a fluid bolus.  - no need for further fluids as CXR has congestion and she has had orthopnea at home and clinically looks a bit fluid overloaded. - start low dose coreg - obtain 2D echo - continue anticoagulation  Troponin elevation  - likely demand given infection and A fib with RVR, overall flat but will trend - no chest pain  UTI - continue Ceftriaxone, cultures pending  Fluid overload - repeat 2D echo, last one in 2014 showed EF 65-70%, mild MR - Lasix 20 mg x 1 - daily weights - CXR suspicious for CHF, unknown at this time what type until echo is back  DM - patient insists on regular diet - continue SSI - A1C pending  Medication non compliance - she was supposed to be on Amiodarone and Xarelto, self discontinued 2 years ago and has not taken any medications or seen any MD since - counseled  Costin M. Cruzita Lederer, MD [facility] [phone]

## 2015-03-31 NOTE — Progress Notes (Signed)
Pharmacy - Heparin Dosing  Assessment:  89 yoF on Heparin per pharmacy for AFib.  Confirmatory level returned at 0.41.  No bleeding or line issues noted per nursing.  Plan: Continue heparin infusion at 1000 units/hr Continue to monitor for bleeding or thrombosis   Reuel Boom, PharmD, BCPS Pager: [phone] 03/31/2015, 5:12 PM

## 2015-03-31 NOTE — Progress Notes (Signed)
ANTICOAGULATION CONSULT NOTE - Initial Consult  Pharmacy Consult for Heparin Indication: atrial fibrillation  No Known Allergies  Patient Measurements: Height: 5\' 4"  (162.6 cm) Weight: 175 lb (79.379 kg) IBW/kg (Calculated) : 54.7 Heparin Dosing Weight: 71 kg   Vital Signs: Temp: 97.6 F (36.4 C) (08/27 0224) Temp Source: Oral (08/27 0224) BP: 100/60 mmHg (08/27 0200) Pulse Rate: 93 (08/27 0200)  Labs:  Recent Labs  03/30/15 2344 03/31/15 0007  HGB 11.2* 12.2  HCT 37.1 36.0  PLT 344  --   LABPROT 16.3*  --   INR 1.30  --   CREATININE 0.99 0.90  TROPONINI 0.05*  --     Estimated Creatinine Clearance: 61.9 mL/min (by C-G formula based on Cr of 0.9).   Medical History: Past Medical History  Diagnosis Date  . Diabetes mellitus without complication     dx on wed  . Mitral valve prolapse   . Atrial flutter     Medications:  Scheduled:  . insulin aspart  0-5 Units Subcutaneous QHS  . insulin aspart  0-9 Units Subcutaneous TID WC   Infusions:  . [START ON 04/01/2015] cefTRIAXone (ROCEPHIN)  IV      Assessment:  69 yr female with c/o feeling faint.    Patient reports taking no meds PTA, except for AZO  Patient has taken Amiodarone and Xarelto in the past (stopped taking in 2014)  Patient found to be in AFib with RVR  Pharmacy consulted to start IV Heparin  Goal of Therapy:  Heparin level 0.3-0.7 units/ml Monitor platelets by anticoagulation protocol: Yes   Plan:   Heparin 3500 unit IV bolus x 1 followed by 1000 units/hr infusion  Check heparin level 6 hr after heparin started  Follow heparin level & CBC daily  Leopold Smyers, Toribio Harbour, PharmD 03/31/2015,3:06 AM

## 2015-04-01 DIAGNOSIS — Z7901 Long term (current) use of anticoagulants: Secondary | ICD-10-CM

## 2015-04-01 DIAGNOSIS — E0590 Thyrotoxicosis, unspecified without thyrotoxic crisis or storm: Secondary | ICD-10-CM

## 2015-04-01 DIAGNOSIS — I9589 Other hypotension: Secondary | ICD-10-CM

## 2015-04-01 DIAGNOSIS — Z5181 Encounter for therapeutic drug level monitoring: Secondary | ICD-10-CM

## 2015-04-01 DIAGNOSIS — E119 Type 2 diabetes mellitus without complications: Secondary | ICD-10-CM

## 2015-04-01 DIAGNOSIS — I38 Endocarditis, valve unspecified: Secondary | ICD-10-CM

## 2015-04-01 DIAGNOSIS — R55 Syncope and collapse: Secondary | ICD-10-CM

## 2015-04-01 DIAGNOSIS — I4891 Unspecified atrial fibrillation: Secondary | ICD-10-CM

## 2015-04-01 DIAGNOSIS — I5033 Acute on chronic diastolic (congestive) heart failure: Secondary | ICD-10-CM

## 2015-04-01 DIAGNOSIS — I248 Other forms of acute ischemic heart disease: Secondary | ICD-10-CM

## 2015-04-01 DIAGNOSIS — N390 Urinary tract infection, site not specified: Secondary | ICD-10-CM

## 2015-04-01 LAB — CBC
HCT: 33.2 % — ABNORMAL LOW (ref 36.0–46.0)
Hemoglobin: 10.3 g/dL — ABNORMAL LOW (ref 12.0–15.0)
MCH: 28.2 pg (ref 26.0–34.0)
MCHC: 31 g/dL (ref 30.0–36.0)
MCV: 91 fL (ref 78.0–100.0)
Platelets: 330 10*3/uL (ref 150–400)
RBC: 3.65 MIL/uL — ABNORMAL LOW (ref 3.87–5.11)
RDW: 16.3 % — AB (ref 11.5–15.5)
WBC: 9.1 10*3/uL (ref 4.0–10.5)

## 2015-04-01 LAB — GLUCOSE, CAPILLARY
Glucose-Capillary: 160 mg/dL — ABNORMAL HIGH (ref 65–99)
Glucose-Capillary: 118 mg/dL — ABNORMAL HIGH (ref 65–99)
Glucose-Capillary: 167 mg/dL — ABNORMAL HIGH (ref 65–99)
Glucose-Capillary: 138 mg/dL — ABNORMAL HIGH (ref 65–99)
Glucose-Capillary: 160 mg/dL — ABNORMAL HIGH (ref 65–99)

## 2015-04-01 LAB — HEPARIN LEVEL (UNFRACTIONATED): HEPARIN UNFRACTIONATED: 0.42 [IU]/mL (ref 0.30–0.70)

## 2015-04-01 LAB — TROPONIN I: TROPONIN I: 0.24 ng/mL — AB (ref <0.031)

## 2015-04-01 LAB — TSH: TSH: 6.084 u[IU]/mL — ABNORMAL HIGH (ref 0.350–4.500)

## 2015-04-01 MED ORDER — METOPROLOL SUCCINATE ER 25 MG PO TB24
25.0000 mg | ORAL_TABLET | Freq: Two times a day (BID) | ORAL | Status: DC
Start: 2015-04-01 — End: 2015-04-04
  Administered 2015-04-01 – 2015-04-04 (×7): 25 mg via ORAL
  Filled 2015-04-01 (×7): qty 1

## 2015-04-01 NOTE — Consult Note (Signed)
CARDIOLOGY CONSULT NOTE  Patient ID: [name] MRN: [id number] DOB/AGE: [birth year] 69 y.o.  Admit date: 03/30/2015 Primary Physician Lujean Amel, MD  Reason for Consultation: rapid atrial fibrillation  HPI: The patient is a 69 yr old woman (retired psychiatric nurse at [employer]) with a PMH significant for atrial fibrillation dating back to at least June 2014 with mild apical hypertrophic cardiomyopathy found at that time. This occurred in the context of sepsis. She also has diabetes.  She was admitted yesterday with urosepsis and near syncope (hypotensive, hypothermic) and found to be in rapid atrial fibrillation and started on IV antimicrobial therapy and IV fluids. She had been on amiodarone and Xarelto in 2014 but stopped taking it on her own.   Echocardiogram yesterday showed normal LV systolic function, LVEF 78-29%, probable diastolic dysfunction, elevated LVEDP and left atrial pressures, mild concentric and severe focal basal septal hypertrophy, severe left atrial enlargement, mild to moderate RA enlargement, moderate mitral regurgitation, moderately reduced RV systolic function, and moderate to severe tricuspid regurgitation with mildly elevated pulmonary pressures (40 mmHg).  No mitral valve prolapse seen.  ECG showed atrial fibrillation, nonspecific IVCD, and possible old anteroseptal infarct.  Troponins 0.05-->0.07-->0.17-->0.19-->0.24.  Chest xray 8/27 suspicious for CHF. Given Lasix x 1.  Currently being treated with Coreg and heparin.  Feeling much better. Denies chest pain. SOB and leg swelling improved. Denies h/o CAD/MI. Diagnosed with hyperthyroidism 30 yrs ago, "resolved on its own". Was afraid to take Xarelto due to TV commercial warnings.  Fam: Mother had atrial fibrillation.   No Known Allergies  Current Facility-Administered Medications  Medication Dose Route Frequency Provider Last Rate Last Dose  . 0.9 %  sodium chloride infusion  250  mL Intravenous PRN Theressa Millard, MD      . acetaminophen (TYLENOL) tablet 650 mg  650 mg Oral Q6H PRN Theressa Millard, MD       Or  . acetaminophen (TYLENOL) suppository 650 mg  650 mg Rectal Q6H PRN Theressa Millard, MD      . alum & mag hydroxide-simeth (MAALOX/MYLANTA) 200-200-20 MG/5ML suspension 30 mL  30 mL Oral Q6H PRN Theressa Millard, MD      . carvedilol (COREG) tablet 3.125 mg  3.125 mg Oral BID WC Costin Karlyne Greenspan, MD   3.125 mg at 03/31/15 1640  . cefTRIAXone (ROCEPHIN) 1 g in dextrose 5 % 50 mL IVPB  1 g Intravenous Q24H Theressa Millard, MD   1 g at 04/01/15 0134  . heparin ADULT infusion 100 units/mL (25000 units/250 mL)  1,000 Units/hr Intravenous Continuous Leann T Poindexter, RPH 10 mL/hr at 04/01/15 0600 1,000 Units/hr at 04/01/15 0600  . HYDROmorphone (DILAUDID) injection 0.5-1 mg  0.5-1 mg Intravenous Q3H PRN Theressa Millard, MD      . insulin aspart (novoLOG) injection 0-5 Units  0-5 Units Subcutaneous QHS Theressa Millard, MD   0 Units at 03/31/15 2200  . insulin aspart (novoLOG) injection 0-9 Units  0-9 Units Subcutaneous TID WC Theressa Millard, MD   1 Units at 03/31/15 1639  . metoprolol (LOPRESSOR) injection 2.5 mg  2.5 mg Intravenous Q6H PRN Theressa Millard, MD      . ondansetron (ZOFRAN) tablet 4 mg  4 mg Oral Q6H PRN Theressa Millard, MD       Or  . ondansetron (ZOFRAN) injection 4 mg  4 mg Intravenous Q6H PRN Theressa Millard, MD      .  oxyCODONE (Oxy IR/ROXICODONE) immediate release tablet 5 mg  5 mg Oral Q4H PRN Theressa Millard, MD      . sodium chloride 0.9 % injection 3 mL  3 mL Intravenous Q12H Theressa Millard, MD   3 mL at 03/31/15 0810  . sodium chloride 0.9 % injection 3 mL  3 mL Intravenous PRN Theressa Millard, MD        Past Medical History  Diagnosis Date  . Diabetes mellitus without complication     dx on wed  . Mitral valve prolapse   . Atrial flutter     Past Surgical History  Procedure Laterality Date    . Dental surgery  Oct. 2011    several extractions, bone graft  . I&d extremity Left 01/06/2013    Procedure: IRRIGATION AND DEBRIDEMENT LEFT ELBOW AND LEFT FOREARM ;  Surgeon: [name], MD;  Location: [facility];  Service: Orthopedics;  Laterality: Left;  . Application of wound vac Left 01/06/2013    Procedure: APPLICATION OF WOUND VAC X 2;  Surgeon: [name], MD;  Location: [facility];  Service: Orthopedics;  Laterality: Left;  left forearm  . Incision and drainage Left 01/09/2013    Procedure: REDO INCISION AND DRAINAGE LEFT ELBOW;  Surgeon: [name], MD;  Location: [facility];  Service: Orthopedics;  Laterality: Left;  SUPINE, UPPER EXTERMITY DRAPE  . Secondary closure of wound Left 01/09/2013    Procedure: SECONDARY CLOSURE OF WOUND  LEFT ELBOW;  Surgeon: [name], MD;  Location: [facility];  Service: Orthopedics;  Laterality: Left;  . Application of wound vac Left 01/09/2013    Procedure: APPLICATION OF WOUND VAC;  Surgeon: [name], MD;  Location: [facility];  Service: Orthopedics;  Laterality: Left;    Social History   Social History  . Marital Status: Single    Spouse Name: N/A  . Number of Children: N/A  . Years of Education: N/A   Occupational History  . Not on file.   Social History Main Topics  . Smoking status: Former Smoker -- 4 years    Quit date: 12/31/1980  . Smokeless tobacco: Never Used  . Alcohol Use: No  . Drug Use: No  . Sexual Activity: Not on file   Other Topics Concern  . Not on file   Social History Narrative   Divorced.  Lives with daughter and grandson. 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       Prior to Admission medications   Medication Sig Start Date End Date Taking? Authorizing Provider  Phenazopyridine HCl (AZO TABS PO) Take 1 tablet by mouth daily.   Yes Historical Provider, MD  amiodarone (PACERONE) 200 MG tablet Take 1 tablet (200 mg total) by mouth 2 (two) times daily. 01/17/13 02/14/13  Velvet Bathe, MD  Rivaroxaban  (XARELTO) 15 MG TABS tablet Take 1 tablet (15 mg total) by mouth 2 (two) times daily with a meal. 01/17/13 01/30/13  Velvet Bathe, MD     Review of systems complete and found to be negative unless listed above in HPI     Physical exam Blood pressure 118/61, pulse 106, temperature 97.3 F (36.3 C), temperature source Oral, resp. rate 19, height 5\' 4"  (1.626 m), weight 192 lb 10.9 oz (87.4 kg), SpO2 96 %. General: NAD Neck: No JVD, no thyromegaly or thyroid nodule.  Lungs: Clear to auscultation bilaterally with normal respiratory effort. CV: Nondisplaced PMI. Mildly tachycardic, irregular rhythm, normal S1/S2, no  S3, no murmur.  No peripheral edema. Normal pedal pulses.  Abdomen: Soft, nontender, no distention.  Skin: Intact without lesions or rashes.  Neurologic: Alert and oriented x 3.  Psych: Normal affect. Extremities: No clubbing or cyanosis.  HEENT: Normal.   ECG: Most recent ECG reviewed.  Labs:   Lab Results  Component Value Date   WBC 9.1 04/01/2015   HGB 10.3* 04/01/2015   HCT 33.2* 04/01/2015   MCV 91.0 04/01/2015   PLT 330 04/01/2015    Recent Labs Lab 03/30/15 2344  03/31/15 0653  NA 141  < > 140  K 3.7  < > 4.4  CL 111  < > 111  CO2 20*  --  20*  BUN 43*  < > 42*  CREATININE 0.99  < > 1.04*  CALCIUM 8.5*  --  8.3*  PROT 6.1*  --   --   BILITOT 0.7  --   --   ALKPHOS 124  --   --   ALT 48  --   --   AST 34  --   --   GLUCOSE 187*  < > 155*  < > = values in this interval not displayed. Lab Results  Component Value Date   TROPONINI 0.24* 04/01/2015    Lab Results  Component Value Date   CHOL 133 12/31/2012   CHOL 155 12/31/2012   Lab Results  Component Value Date   HDL 8* 12/31/2012   HDL 7* 12/31/2012   Lab Results  Component Value Date   LDLCALC 98 12/31/2012   LDLCALC 117* 12/31/2012   Lab Results  Component Value Date   TRIG 137 12/31/2012   TRIG 153* 12/31/2012   Lab Results  Component Value Date   CHOLHDL 16.6 12/31/2012    CHOLHDL 22.1 12/31/2012   No results found for: LDLDIRECT       Studies: Dg Chest Port 1 View  03/31/2015   CLINICAL DATA:  Weakness and shortness of breath. Near syncope. Bilateral lower extremity swelling.  EXAM: PORTABLE CHEST - 1 VIEW  COMPARISON:  01/01/2013  FINDINGS: Cardiomegaly is again seen. There is progressive interstitial and perihilar opacities concerning for pulmonary edema. Mild atelectasis at the right lung base. No confluent airspace disease. Questionable blunting of left costophrenic angle. No pneumothorax.  IMPRESSION: Findings suspicious for CHF.   Electronically Signed   By: Jeb Levering M.D.   On: 03/31/2015 01:21    ASSESSMENT AND PLAN:  1. Atrial fibrillation with RVR: Symptomatically stable. Rates better controlled with Coreg. However, I will switch to Toprol-XL 25 mg bid. I think heparin can be stopped and Xarelto restarted. However, she is uncertain about starting anticoagulants. We spoke at length about different options including warfarin, Xarelto, and Eliquis. She prefers to think about things rather than starting something today. Will continue heparin for now. Given severe left atrial enlargement and obesity with elevated filling pressures, I doubt ability to maintain sinus rhythm for any significant length of time. Would favor rate control and anticoagulation strategy. Will check TSH given prior h/o hyperthyroidism.  2. Troponin elevation/demand ischemia: No chest pain. Troponins mildly elevated. CHF due to IV fluid resuscitation with elevated filling pressures and probable diastolic dysfunction. Can consider outpatient stress testing for risk stratification purposes given risk factors.  3. Acute on chronic diastolic heart failure: Resolved with one dose of Lasix. Appears stable/euvolemic at present.  4. Urosepsis: On antibiotics.  5. Valvular heart disease: Monitor with physical exam and periodic echocardiographic surveillance.  6.  Prior h/o  hyperthyroidism: Will check TSH given prior h/o hyperthyroidism.   Signed: Kate Sable, M.D., F.A.C.C.  04/01/2015, 8:37 AM

## 2015-04-01 NOTE — Progress Notes (Signed)
ANTICOAGULATION CONSULT NOTE - Follow Up Consult  Pharmacy Consult for Heparin Indication: atrial fibrillation  No Known Allergies  Patient Measurements: Height: 5\' 4"  (162.6 cm) Weight: 192 lb 10.9 oz (87.4 kg) IBW/kg (Calculated) : 54.7 Heparin Dosing Weight: 71 kg  Vital Signs: Temp: 97.5 F (36.4 C) (08/28 0400) Temp Source: Oral (08/28 0400) BP: 118/61 mmHg (08/28 0400) Pulse Rate: 106 (08/28 0400)  Labs:  Recent Labs  03/30/15 2344 03/31/15 0007 03/31/15 0259 03/31/15 0653 03/31/15 1034 03/31/15 1450 03/31/15 1650 04/01/15 0413  HGB 11.2* 12.2  --  11.0*  --   --   --  10.3*  HCT 37.1 36.0  --  35.6*  --   --   --  33.2*  PLT 344  --   --  330  --   --   --  330  APTT  --   --  22*  --   --   --   --   --   LABPROT 16.3*  --  15.7*  --   --   --   --   --   INR 1.30  --  1.24  --   --   --   --   --   HEPARINUNFRC  --   --   --   --  0.37  --  0.41 0.42  CREATININE 0.99 0.90  --  1.04*  --   --   --   --   TROPONINI 0.05*  --  0.07*  --  0.17* 0.19*  --  0.24*    Estimated Creatinine Clearance: 56.2 mL/min (by C-G formula based on Cr of 1.04).   Medications:  Scheduled:  . carvedilol  3.125 mg Oral BID WC  . cefTRIAXone (ROCEPHIN)  IV  1 g Intravenous Q24H  . insulin aspart  0-5 Units Subcutaneous QHS  . insulin aspart  0-9 Units Subcutaneous TID WC  . sodium chloride  3 mL Intravenous Q12H   Infusions:  . heparin 1,000 Units/hr (04/01/15 0600)   PRN: sodium chloride, acetaminophen **OR** acetaminophen, alum & mag hydroxide-simeth, HYDROmorphone (DILAUDID) injection, metoprolol, ondansetron **OR** ondansetron (ZOFRAN) IV, oxyCODONE, sodium chloride  Assessment: 69 yo female with c/o feeling faint, found to be in Afib with RVR. Patient has taken Amiodarone and Xarelto in the past (stopped taking in 2014). Pharmacy consulted to start IV Heparin.  04/01/2015  Heparin level remains therapeutic (0.42) on 1000 units/hr  CBC stable, no bleeding  reported  Remains in afib with HR ~100s, on coreg  Goal of Therapy:  Heparin level 0.3-0.7 units/ml Monitor platelets by anticoagulation protocol: Yes   Plan:   Continue heparin at current rate  Daily heparin level and CBC  F/u further plans for management of afib  Peggyann Juba, PharmD, BCPS Pager: [phone] 04/01/2015,7:05 AM

## 2015-04-01 NOTE — Progress Notes (Signed)
PROGRESS NOTE  [name] QTM:[id number] DOB: 1946/01/07 DOA: 03/30/2015 PCP: Lujean Amel, MD   HPI: 69 yo F admitted on 8/27 with A fib with RVR in the setting of sepsis due to UTI.  Subjective / 24 H Interval events - still endorsing shortness of breath this morning, improved - denies chest pain / palpitations  Assessment/Plan: Principal Problem:   Sepsis Active Problems:   Hypotension   Diabetes mellitus type 2, noninsulin dependent   UTI (lower urinary tract infection)   Atrial fibrillation with rapid ventricular response   Near syncope   A fib with RVR - rates improved some with Coreg but BP low at times - question of rate vs rhythm control given hypertrophic cardiomyopathy, have asked cardiology to see her as well whether she should be on Amiodarone. - she is not sure about anticoagulation, continue heparin for now  Troponin elevation  - likely demand given infection and A fib with RVR, overall flat   UTI - continue Ceftriaxone, cultures pending  Acute on chronic diastolic CHF - repeat 2D echo as below  - s/p Lasix 20 mg x 1 on 8/27  DM - patient insists on regular diet - continue SSI - A1C pending  Medication non compliance - she was supposed to be on Amiodarone and Xarelto, self discontinued 2 years ago and has not taken any medications or seen any MD since - counseled   Diet: Diet regular Room service appropriate?: Yes; Fluid consistency:: Thin Fluids: none DVT Prophylaxis: heparin gtt  Code Status: Full Code Family Communication: no family bedisde  Disposition Plan: transfer to telemetry  Consultants:  Cardiology   Procedures:  2D echo Study Conclusions - Left ventricle: The cavity size was normal. Systolic function was normal. The estimated ejection fraction was in the range of 55%to 60%. Wall motion was normal; there were no regional wallmotion abnormalities. The study was not technically sufficient toallow evaluation of LV  diastolic dysfunction due to atrialfibrillation. Doppler parameters are consistent with bothelevated ventricular end-diastolic filling pressure and elevatedleft atrial filling pressure. However, given elevated LVEDP andLA pressures with severe left atrial enlargement, diastolicdysfunction is likely. Mild concentric and severe focal basalseptal hypertrophy. - Mitral valve: Moderately calcified annulus. Mildly thickenedleaflets . There was moderate regurgitation. - Left atrium: The atrium was severely dilated. - Right ventricle: Systolic function was moderately reduced. - Right atrium: The atrium was mildly to moderately dilated. - Tricuspid valve: There was moderate-severe regurgitation. - Pulmonic valve: There was mild regurgitation. - Pulmonary arteries: PASP 40 mmHg. Systolic pressure was mildlyincreased.   Antibiotics  Anti-infectives    Start     Dose/Rate Route Frequency Ordered Stop   04/01/15 0200  cefTRIAXone (ROCEPHIN) 1 g in dextrose 5 % 50 mL IVPB     1 g 100 mL/hr over 30 Minutes Intravenous Every 24 hours 03/31/15 0246     03/31/15 0130  cefTRIAXone (ROCEPHIN) 1 g in dextrose 5 % 50 mL IVPB     1 g 100 mL/hr over 30 Minutes Intravenous  Once 03/31/15 0124 03/31/15 0345       Studies  Dg Chest Port 1 View  03/31/2015   CLINICAL DATA:  Weakness and shortness of breath. Near syncope. Bilateral lower extremity swelling.  EXAM: PORTABLE CHEST - 1 VIEW  COMPARISON:  01/01/2013  FINDINGS: Cardiomegaly is again seen. There is progressive interstitial and perihilar opacities concerning for pulmonary edema. Mild atelectasis at the right lung base. No confluent airspace disease. Questionable blunting of left costophrenic angle. No  pneumothorax.  IMPRESSION: Findings suspicious for CHF.   Electronically Signed   By: Jeb Levering M.D.   On: 03/31/2015 01:21    Objective  Filed Vitals:   04/01/15 0002 04/01/15 0135 04/01/15 0400 04/01/15 0800  BP: 97/52  118/61   Pulse:  103  106   Temp: 97.8 F (36.6 C)  97.5 F (36.4 C) 97.3 F (36.3 C)  TempSrc: Oral  Oral Oral  Resp: 20  19   Height:      Weight:  87.4 kg (192 lb 10.9 oz)    SpO2: 90%  96%     Intake/Output Summary (Last 24 hours) at 04/01/15 1013 Last data filed at 04/01/15 0600  Gross per 24 hour  Intake    495 ml  Output    860 ml  Net   -365 ml   Filed Weights   03/31/15 0604 03/31/15 1337 04/01/15 0135  Weight: 82.2 kg (181 lb 3.5 oz) 85.7 kg (188 lb 15 oz) 87.4 kg (192 lb 10.9 oz)    Exam:  GENERAL: NAD  HEENT: head NCAT, no scleral icterus. Pupils round and reactive.  LUNGS: Clear to auscultation. No wheezing or crackles  HEART: irregular, without murmurs. 2+ pulses, no JVD, no peripheral edema  ABDOMEN: Soft, nontender, and nondistended. Positive bowel sounds. No hepatosplenomegaly was noted.  EXTREMITIES: Without any cyanosis, clubbing, rash, lesions or edema.  NEUROLOGIC: Alert and oriented x3. Strength 5/5 in all 4.  PSYCHIATRIC: Normal mood and affect  Data Reviewed: Basic Metabolic Panel:  Recent Labs Lab 03/30/15 2344 03/31/15 0007 03/31/15 0653  NA 141 142 140  K 3.7 3.6 4.4  CL 111 109 111  CO2 20*  --  20*  GLUCOSE 187* 186* 155*  BUN 43* 39* 42*  CREATININE 0.99 0.90 1.04*  CALCIUM 8.5*  --  8.3*   Liver Function Tests:  Recent Labs Lab 03/30/15 2344  AST 34  ALT 48  ALKPHOS 124  BILITOT 0.7  PROT 6.1*  ALBUMIN 3.5   CBC:  Recent Labs Lab 03/30/15 2344 03/31/15 0007 03/31/15 0653 04/01/15 0413  WBC 10.4  --  10.8* 9.1  NEUTROABS 7.4  --   --   --   HGB 11.2* 12.2 11.0* 10.3*  HCT 37.1 36.0 35.6* 33.2*  MCV 89.0  --  89.7 91.0  PLT 344  --  330 330   Cardiac Enzymes:  Recent Labs Lab 03/30/15 2344 03/31/15 0259 03/31/15 1034 03/31/15 1450 04/01/15 0413  TROPONINI 0.05* 0.07* 0.17* 0.19* 0.24*   BNP (last 3 results)  Recent Labs  03/30/15 2344  BNP 630.5*   CBG:  Recent Labs Lab 03/31/15 0731 03/31/15 1143  03/31/15 1629 03/31/15 2032 03/31/15 2239  GLUCAP 145* 139* 135* 172* 160*    Recent Results (from the past 240 hour(s))  MRSA PCR Screening     Status: None   Collection Time: 03/31/15  8:40 AM  Result Value Ref Range Status   MRSA by PCR NEGATIVE NEGATIVE Final    Comment:        The GeneXpert MRSA Assay (FDA approved for NASAL specimens only), is one component of a comprehensive MRSA colonization surveillance program. It is not intended to diagnose MRSA infection nor to guide or monitor treatment for MRSA infections.      Scheduled Meds: . cefTRIAXone (ROCEPHIN)  IV  1 g Intravenous Q24H  . insulin aspart  0-5 Units Subcutaneous QHS  . insulin aspart  0-9 Units Subcutaneous TID WC  .  metoprolol succinate  25 mg Oral BID  . sodium chloride  3 mL Intravenous Q12H   Continuous Infusions: . heparin 1,000 Units/hr (04/01/15 0600)   Marzetta Board, MD [facility] Pager [phone]. If 7 PM - 7 AM, please contact night-coverage at [email or web address], password [facility] 04/01/2015, 10:13 AM  LOS: 1 day

## 2015-04-01 NOTE — Progress Notes (Signed)
Pt transferred to 1414. Left unit in wheelchair pushed by this RN and nurse tech. Arrived in good condition.

## 2015-04-01 NOTE — Progress Notes (Signed)
Utilization review completed.  

## 2015-04-02 LAB — CBC
HEMATOCRIT: 34.2 % — AB (ref 36.0–46.0)
HEMOGLOBIN: 10.5 g/dL — AB (ref 12.0–15.0)
MCH: 27.9 pg (ref 26.0–34.0)
MCHC: 30.7 g/dL (ref 30.0–36.0)
MCV: 91 fL (ref 78.0–100.0)
Platelets: 357 10*3/uL (ref 150–400)
RBC: 3.76 MIL/uL — AB (ref 3.87–5.11)
RDW: 16.4 % — ABNORMAL HIGH (ref 11.5–15.5)
WBC: 8.3 10*3/uL (ref 4.0–10.5)

## 2015-04-02 LAB — BASIC METABOLIC PANEL
Anion gap: 7 (ref 5–15)
BUN: 33 mg/dL — AB (ref 6–20)
CALCIUM: 8.5 mg/dL — AB (ref 8.9–10.3)
CHLORIDE: 111 mmol/L (ref 101–111)
CO2: 21 mmol/L — AB (ref 22–32)
CREATININE: 0.73 mg/dL (ref 0.44–1.00)
GFR calc non Af Amer: >60 mL/min (ref >60)
GLUCOSE: 122 mg/dL — AB (ref 65–99)
Potassium: 4.1 mmol/L (ref 3.5–5.1)
Sodium: 139 mmol/L (ref 135–145)

## 2015-04-02 LAB — GLUCOSE, CAPILLARY
GLUCOSE-CAPILLARY: 113 mg/dL — AB (ref 65–99)
Glucose-Capillary: 106 mg/dL — ABNORMAL HIGH (ref 65–99)
Glucose-Capillary: 167 mg/dL — ABNORMAL HIGH (ref 65–99)
Glucose-Capillary: 220 mg/dL — ABNORMAL HIGH (ref 65–99)

## 2015-04-02 LAB — HEMOGLOBIN A1C
HEMOGLOBIN A1C: 7.8 % — AB (ref 4.8–5.6)
MEAN PLASMA GLUCOSE: 177 mg/dL

## 2015-04-02 LAB — HEPARIN LEVEL (UNFRACTIONATED): Heparin Unfractionated: 0.34 IU/mL (ref 0.30–0.70)

## 2015-04-02 MED ORDER — RIVAROXABAN 20 MG PO TABS
20.0000 mg | ORAL_TABLET | Freq: Every day | ORAL | Status: DC
  Administered 2015-04-02 – 2015-04-05 (×4): 20 mg via ORAL
  Filled 2015-04-02 (×4): qty 1

## 2015-04-02 MED ORDER — FUROSEMIDE 10 MG/ML IJ SOLN
40.0000 mg | Freq: Once | INTRAMUSCULAR | Status: AC
  Administered 2015-04-02: 40 mg via INTRAVENOUS
  Filled 2015-04-02: qty 4

## 2015-04-02 NOTE — Discharge Instructions (Addendum)
Information on my medicine - XARELTO (Rivaroxaban)  This medication education was reviewed with me or my healthcare representative as part of my discharge preparation.  The pharmacist that spoke with me during my hospital stay was:  Angela Adam [facility]  Why was Xarelto prescribed for you? Xarelto was prescribed for you to reduce the risk of a blood clot forming that can cause a stroke if you have a medical condition called atrial fibrillation (a type of irregular heartbeat).  What do you need to know about xarelto ? Take your Xarelto ONCE DAILY at the same time every day with your evening meal. If you have difficulty swallowing the tablet whole, you may crush it and mix in applesauce just prior to taking your dose.  Take Xarelto exactly as prescribed by your doctor and DO NOT stop taking Xarelto without talking to the doctor who prescribed the medication.  Stopping without other stroke prevention medication to take the place of Xarelto may increase your risk of developing a clot that causes a stroke.  Refill your prescription before you run out.  After discharge, you should have regular check-up appointments with your healthcare provider that is prescribing your Xarelto.  In the future your dose may need to be changed if your kidney function or weight changes by a significant amount.  What do you do if you miss a dose? If you are taking Xarelto ONCE DAILY and you miss a dose, take it as soon as you remember on the same day then continue your regularly scheduled once daily regimen the next day. Do not take two doses of Xarelto at the same time or on the same day.   Important Safety Information A possible side effect of Xarelto is bleeding. You should call your healthcare provider right away if you experience any of the following: ? Bleeding from an injury or your nose that does not stop. ? Unusual colored urine (red or dark brown) or unusual colored stools (red or  black). ? Unusual bruising for unknown reasons. ? A serious fall or if you hit your head (even if there is no bleeding).  Some medicines may interact with Xarelto and might increase your risk of bleeding while on Xarelto. To help avoid this, consult your healthcare provider or pharmacist prior to using any new prescription or non-prescription medications, including herbals, vitamins, non-steroidal anti-inflammatory drugs (NSAIDs) and supplements.  This website has more information on Xarelto: [email or web address]   Limit salt intake, limit daily fluid intake to <2 L per day. Weigh yourself every morning, call cardiology if weight increase by more than 3 lbs overnight or 5 lbs in a single week

## 2015-04-02 NOTE — Progress Notes (Signed)
Patient Name: [name] Date of Encounter: 04/02/2015  Principal Problem:   Sepsis Active Problems:   Hypotension   Diabetes mellitus type 2, noninsulin dependent   UTI (lower urinary tract infection)   Atrial fibrillation with rapid ventricular response   Near syncope   Primary Cardiologist: Saw Dr Wynonia Lawman in 2014 hospital stay, she did not follow up  Patient Profile: 69 yo female w/ hx PAF (dx 2014 in setting of sepsis), not anticoagulated, mild apical hypertrophic CM (2014), DM, hyperthyroid (years ago). Cards following for afib and elevated troponin.  SUBJECTIVE: Orthopnea and PND overnight, when she gets SOB, she also gets chest pressure.  OBJECTIVE Filed Vitals:   04/01/15 1507 04/01/15 1531 04/01/15 2053 04/02/15 0604  BP: 102/69 98/58 120/72 115/71  Pulse:   84 52  Temp:   98 F (36.7 C) 97.4 F (36.3 C)  TempSrc:   Oral Oral  Resp:   20 20  Height:      Weight:    185 lb 6.5 oz (84.1 kg)  SpO2: 96% 94% 97% 97%    Intake/Output Summary (Last 24 hours) at 04/02/15 0834 Last data filed at 04/02/15 0700  Gross per 24 hour  Intake    580 ml  Output    870 ml  Net   -290 ml   Filed Weights   03/31/15 1337 04/01/15 0135 04/02/15 0604  Weight: 188 lb 15 oz (85.7 kg) 192 lb 10.9 oz (87.4 kg) 185 lb 6.5 oz (84.1 kg)    PHYSICAL EXAM General: Well developed, well nourished, female in no acute distress. Head: Normocephalic, atraumatic.  Neck: Supple without bruits, JVD 9 cm. Lungs:  Resp regular and unlabored, rales bases. Heart: Irreg R&R, S1, S2, no S3, S4, or murmur; no rub. Abdomen: Soft, non-tender, non-distended, BS + x 4.  Extremities: No clubbing, cyanosis, trace edema.  Neuro: Alert and oriented X 3. Moves all extremities spontaneously. Psych: Normal affect.  LABS: CBC: Recent Labs  03/30/15 2344  04/01/15 0413 04/02/15 0431  WBC 10.4  < > 9.1 8.3  NEUTROABS 7.4  --   --   --   HGB 11.2*  < > 10.3* 10.5*  HCT 37.1  < > 33.2* 34.2*    MCV 89.0  < > 91.0 91.0  PLT 344  < > 330 357  < > = values in this interval not displayed. INR: Recent Labs  03/31/15 0259  INR 8.29   Basic Metabolic Panel: Recent Labs  03/31/15 0653 04/02/15 0431  NA 140 139  K 4.4 4.1  CL 111 111  CO2 20* 21*  GLUCOSE 155* 122*  BUN 42* 33*  CREATININE 1.04* 0.73  CALCIUM 8.3* 8.5*   Liver Function Tests: Recent Labs  03/30/15 2344  AST 34  ALT 48  ALKPHOS 124  BILITOT 0.7  PROT 6.1*  ALBUMIN 3.5   Cardiac Enzymes: Recent Labs  03/31/15 1034 03/31/15 1450 04/01/15 0413  TROPONINI 0.17* 0.19* 0.24*   BNP:  B NATRIURETIC PEPTIDE  Date/Time Value Ref Range Status  03/30/2015 11:44 PM 630.5* 0.0 - 100.0 pg/mL Final   Thyroid Function Tests: Recent Labs  04/01/15 1315  TSH 6.084*   Lab Results  Component Value Date   TSH 6.084* 2014   FREE T3 1.5                         (2.3-4.2) 2014   FREE T4 0.93 2014  Lab Results  Component Value Date   HGBA1C 10.6* 12/31/2012    TELE:   Atrial fib, rate generally 80-110      ECHO:  03/31/2015 - Left ventricle: The cavity size was normal. Systolic function was normal. The estimated ejection fraction was in the range of 55% to 60%. Wall motion was normal; there were no regional wall motion abnormalities. The study was not technically sufficient to allow evaluation of LV diastolic dysfunction due to atrial fibrillation. Doppler parameters are consistent with both elevated ventricular end-diastolic filling pressure and elevated left atrial filling pressure. However, given elevated LVEDP and LA pressures with severe left atrial enlargement, diastolic dysfunction is likely. Mild concentric and severe focal basal septal hypertrophy. - Mitral valve: Moderately calcified annulus. Mildly thickened leaflets . There was moderate regurgitation. - Left atrium: The atrium was severely dilated. 46 mm, Volume 104 ml - Right ventricle: Systolic function was  moderately reduced. - Right atrium: The atrium was mildly to moderately dilated. - Tricuspid valve: There was moderate-severe regurgitation. - Pulmonic valve: There was mild regurgitation. - Pulmonary arteries: PASP 40 mmHg. Systolic pressure was mildly increased.  Radiology/Studies: Dg Chest Port 1 View  03/31/2015   CLINICAL DATA:  Weakness and shortness of breath. Near syncope. Bilateral lower extremity swelling.  EXAM: PORTABLE CHEST - 1 VIEW  COMPARISON:  01/01/2013  FINDINGS: Cardiomegaly is again seen. There is progressive interstitial and perihilar opacities concerning for pulmonary edema. Mild atelectasis at the right lung base. No confluent airspace disease. Questionable blunting of left costophrenic angle. No pneumothorax.  IMPRESSION: Findings suspicious for CHF.   Electronically Signed   By: Jeb Levering M.D.   On: 03/31/2015 01:21    Current Medications:  . cefTRIAXone (ROCEPHIN)  IV  1 g Intravenous Q24H  . insulin aspart  0-5 Units Subcutaneous QHS  . insulin aspart  0-9 Units Subcutaneous TID WC  . metoprolol succinate  25 mg Oral BID  . sodium chloride  3 mL Intravenous Q12H   . heparin 1,000 Units/hr (04/02/15 0208)    ASSESSMENT AND PLAN:   Atrial fibrillation with rapid ventricular response - rate improved on low-dose Toprol XL, no dose change    Anticoagulation - Pt agrees to Xarelto, will order per pharmacy    Elevated troponin - peak 0.24 - EF nl on echo, no WMA - However, pt getting chest pressure w/ SOB and says she needs knee surgery - recommend MV, MD advise on getting as OP    Acute Diastolic CHF - BNP elevated, CHF on CXR on admit, got Lasix 20 mg x 1 - weight is up from admission 181>>185 - some volume overload by exam. - < 1 L UOP after 20 mg Lasix, will give 40 mg. - warned pt that her BP might be low today because of this, she is OK, wants to breathe better  Otherwise, per IM Principal Problem:   Sepsis Active Problems:    Hypotension   Diabetes mellitus type 2, noninsulin dependent   UTI (lower urinary tract infection)   Near syncope  Signed, Lenoard Aden 8:34 AM 04/02/2015 Agree with assessment and plan as noted above. This time she states that she will be compliant with taking her cardiac meds after discharge. She is currently diuresing well after lasix given earlier.  No chest pain. Her mild troponin elevation is consistent with demand ischemia. Would favor lexiscan myoview as outpatient, particularly since she may be facing right knee replacement in the future.

## 2015-04-02 NOTE — Progress Notes (Addendum)
ANTICOAGULATION CONSULT NOTE - Follow Up Consult  Pharmacy Consult for Heparin Indication: atrial fibrillation  No Known Allergies  Patient Measurements: Height: 5\' 4"  (162.6 cm) Weight: 185 lb 6.5 oz (84.1 kg) IBW/kg (Calculated) : 54.7 Heparin Dosing Weight: 71 kg  Vital Signs: Temp: 97.4 F (36.3 C) (08/29 0604) Temp Source: Oral (08/29 0604) BP: 115/71 mmHg (08/29 0604) Pulse Rate: 52 (08/29 0604)  Labs:  Recent Labs  03/30/15 2344 03/31/15 0007 03/31/15 0259 03/31/15 0653  03/31/15 1034 03/31/15 1450 03/31/15 1650 04/01/15 0413 04/02/15 0431  HGB 11.2* 12.2  --  11.0*  --   --   --   --  10.3* 10.5*  HCT 37.1 36.0  --  35.6*  --   --   --   --  33.2* 34.2*  PLT 344  --   --  330  --   --   --   --  330 357  APTT  --   --  22*  --   --   --   --   --   --   --   LABPROT 16.3*  --  15.7*  --   --   --   --   --   --   --   INR 1.30  --  1.24  --   --   --   --   --   --   --   HEPARINUNFRC  --   --   --   --   < > 0.37  --  0.41 0.42 0.34  CREATININE 0.99 0.90  --  1.04*  --   --   --   --   --  0.73  TROPONINI 0.05*  --  0.07*  --   --  0.17* 0.19*  --  0.24*  --   < > = values in this interval not displayed.  Estimated Creatinine Clearance: 71.6 mL/min (by C-G formula based on Cr of 0.73).   Medications:  Scheduled:  . cefTRIAXone (ROCEPHIN)  IV  1 g Intravenous Q24H  . insulin aspart  0-5 Units Subcutaneous QHS  . insulin aspart  0-9 Units Subcutaneous TID WC  . metoprolol succinate  25 mg Oral BID  . sodium chloride  3 mL Intravenous Q12H   Infusions:  . heparin 1,000 Units/hr (04/02/15 0208)   PRN: sodium chloride, acetaminophen **OR** acetaminophen, alum & mag hydroxide-simeth, HYDROmorphone (DILAUDID) injection, metoprolol, ondansetron **OR** ondansetron (ZOFRAN) IV, oxyCODONE, sodium chloride  Assessment: 69 yo female with c/o feeling faint, found to be in Afib with RVR. Patient has taken Amiodarone and Xarelto in the past (stopped taking in  2014). Pharmacy consulted to start IV Heparin.  04/02/2015  Heparin level remains therapeutic (0.34) on 1000 units/hr  CBC stable, no bleeding reported  Remains in afib with HR ~100s, on coreg  Goal of Therapy:  Heparin level 0.3-0.7 units/ml Monitor platelets by anticoagulation protocol: Yes   Plan:   Continue heparin at current rate  Daily heparin level and CBC  F/u further plans for management of afib  Dolly Rias [facility] 04/02/2015, 8:02 AM Pager [phone]  Pharmacy is now consulted to convert heparin to xarelto for nonvalvular atrial fibrillation.  Goal of Therapy: Therapeutic anticoagulation Dose appropriate for renal function Monitor platelets by anticoagulation protocol  Plan: -Patient is appropriate for standard xarelto 20mg  once daily dose, will begin immediately after discontinuation of heparin today @ 1100 -pharmacy will provide education  Dolly Rias RPh 04/02/2015, 10:02  AM Pager [phone]

## 2015-04-02 NOTE — Care Management Important Message (Signed)
Important Message  Patient Details  Name: RUTHEL MARTINE MRN: [id number] Date of Birth: 08/26/1945   Medicare Important Message Given:  [facility] notification given    Camillo Flaming 04/02/2015, 11:15 AMImportant Message  Patient Details  Name: LENNY BOUCHILLON MRN: [id number] Date of Birth: 11-27-1945   Medicare Important Message Given:  Yes-second notification given    Camillo Flaming 04/02/2015, 11:15 AM

## 2015-04-02 NOTE — Progress Notes (Signed)
PROGRESS NOTE  [name]:[id number] DOB: Oct 23, 1945 DOA: 03/30/2015 PCP: Lujean Amel, MD   HPI: 69 yo F admitted on 8/27 with A fib with RVR in the setting of sepsis due to UTI. Patient was initially admitted to SDU and cardiology was consulted. Her rates have improved with beta blockers and she was transferred to floor on 8/28  Subjective / 24 H Interval events - had a "terrible night", could not sleep since she was unable to lay flat due to dyspnea - no chest pain, no palpitations  Assessment/Plan: Principal Problem:   Sepsis Active Problems:   Hypotension   Diabetes mellitus type 2, noninsulin dependent   UTI (lower urinary tract infection)   Atrial fibrillation with rapid ventricular response   Near syncope   A fib with RVR - rates improved on Metoprolol - cardiology following - start Xarelto today  Troponin elevation  - likely demand given infection and A fib with RVR, overall flat  - cardiology following  UTI - continue Ceftriaxone, cultures still pending, monitor  Acute on chronic diastolic CHF - repeat 2D echo as below  - s/p Lasix 20 mg x 1 on 8/27 and again will diurese today given dyspnea  DM - patient insists on regular diet - continue SSI - A1C 7.8 showing poor control  Medication non compliance - she was supposed to be on Amiodarone and Xarelto, self discontinued 2 years ago and has not taken any medications or seen any MD since   Diet: Diet regular Room service appropriate?: Yes; Fluid consistency:: Thin Fluids: none DVT Prophylaxis: heparin gtt  Code Status: Full Code Family Communication: no family bedisde  Disposition Plan: transfer to telemetry  Consultants:  Cardiology   Procedures:  2D echo Study Conclusions - Left ventricle: The cavity size was normal. Systolic function was normal. The estimated ejection fraction was in the range of 55%to 60%. Wall motion was normal; there were no regional wallmotion abnormalities.  The study was not technically sufficient toallow evaluation of LV diastolic dysfunction due to atrialfibrillation. Doppler parameters are consistent with bothelevated ventricular end-diastolic filling pressure and elevatedleft atrial filling pressure. However, given elevated LVEDP andLA pressures with severe left atrial enlargement, diastolicdysfunction is likely. Mild concentric and severe focal basalseptal hypertrophy. - Mitral valve: Moderately calcified annulus. Mildly thickenedleaflets . There was moderate regurgitation. - Left atrium: The atrium was severely dilated. - Right ventricle: Systolic function was moderately reduced. - Right atrium: The atrium was mildly to moderately dilated. - Tricuspid valve: There was moderate-severe regurgitation. - Pulmonic valve: There was mild regurgitation. - Pulmonary arteries: PASP 40 mmHg. Systolic pressure was mildlyincreased.   Antibiotics  Anti-infectives    Start     Dose/Rate Route Frequency Ordered Stop   04/01/15 0200  cefTRIAXone (ROCEPHIN) 1 g in dextrose 5 % 50 mL IVPB     1 g 100 mL/hr over 30 Minutes Intravenous Every 24 hours 03/31/15 0246     03/31/15 0130  cefTRIAXone (ROCEPHIN) 1 g in dextrose 5 % 50 mL IVPB     1 g 100 mL/hr over 30 Minutes Intravenous  Once 03/31/15 0124 03/31/15 0345       Studies  No results found.  Objective  Filed Vitals:   04/01/15 1507 04/01/15 1531 04/01/15 2053 04/02/15 0604  BP: 102/69 98/58 120/72 115/71  Pulse:   84 52  Temp:   98 F (36.7 C) 97.4 F (36.3 C)  TempSrc:   Oral Oral  Resp:   20 20  Height:  Weight:    84.1 kg (185 lb 6.5 oz)  SpO2: 96% 94% 97% 97%    Intake/Output Summary (Last 24 hours) at 04/02/15 1158 Last data filed at 04/02/15 1153  Gross per 24 hour  Intake    520 ml  Output   1670 ml  Net  -1150 ml   Filed Weights   03/31/15 1337 04/01/15 0135 04/02/15 0604  Weight: 85.7 kg (188 lb 15 oz) 87.4 kg (192 lb 10.9 oz) 84.1 kg (185 lb 6.5 oz)    Exam:  GENERAL: NAD  HEENT: head NCAT, no scleral icterus. Pupils round and reactive.  LUNGS: Clear to auscultation. No wheezing or crackles  HEART: irregular, without murmurs. 2+ pulses, no JVD, no peripheral edema  ABDOMEN: Soft, nontender, and nondistended. Positive bowel sounds. No hepatosplenomegaly was noted.  EXTREMITIES: Without any cyanosis, clubbing, rash, lesions or edema.  NEUROLOGIC: Alert and oriented x3. Strength 5/5 in all 4.  PSYCHIATRIC: Normal mood and affect  Data Reviewed: Basic Metabolic Panel:  Recent Labs Lab 03/30/15 2344 03/31/15 0007 03/31/15 0653 04/02/15 0431  NA 141 142 140 139  K 3.7 3.6 4.4 4.1  CL 111 109 111 111  CO2 20*  --  20* 21*  GLUCOSE 187* 186* 155* 122*  BUN 43* 39* 42* 33*  CREATININE 0.99 0.90 1.04* 0.73  CALCIUM 8.5*  --  8.3* 8.5*   Liver Function Tests:  Recent Labs Lab 03/30/15 2344  AST 34  ALT 48  ALKPHOS 124  BILITOT 0.7  PROT 6.1*  ALBUMIN 3.5   CBC:  Recent Labs Lab 03/30/15 2344 03/31/15 0007 03/31/15 0653 04/01/15 0413 04/02/15 0431  WBC 10.4  --  10.8* 9.1 8.3  NEUTROABS 7.4  --   --   --   --   HGB 11.2* 12.2 11.0* 10.3* 10.5*  HCT 37.1 36.0 35.6* 33.2* 34.2*  MCV 89.0  --  89.7 91.0 91.0  PLT 344  --  330 330 357   Cardiac Enzymes:  Recent Labs Lab 03/30/15 2344 03/31/15 0259 03/31/15 1034 03/31/15 1450 04/01/15 0413  TROPONINI 0.05* 0.07* 0.17* 0.19* 0.24*   BNP (last 3 results)  Recent Labs  03/30/15 2344  BNP 630.5*   CBG:  Recent Labs Lab 04/01/15 0809 04/01/15 1243 04/01/15 1702 04/01/15 2051 04/02/15 0755  GLUCAP 118* 167* 138* 160* 106*    Recent Results (from the past 240 hour(s))  Culture, blood (x 2)     Status: None (Preliminary result)   Collection Time: 03/30/15 11:44 PM  Result Value Ref Range Status   Specimen Description BLOOD RIGHT ARM  Final   Special Requests BOTTLES DRAWN AEROBIC AND ANAEROBIC 5ML  Final   Culture   Final    NO GROWTH  1 DAY Performed at [facility]    Report Status PENDING  Incomplete  Urine culture     Status: None (Preliminary result)   Collection Time: 03/31/15 12:16 AM  Result Value Ref Range Status   Specimen Description URINE, CLEAN CATCH  Final   Special Requests NONE  Final   Culture   Final    >=100,000 COLONIES/mL GRAM NEGATIVE RODS SUSCEPTIBILITIES TO FOLLOW Performed at [facility]    Report Status PENDING  Incomplete  Culture, blood (x 2)     Status: None (Preliminary result)   Collection Time: 03/31/15  3:10 AM  Result Value Ref Range Status   Specimen Description BLOOD LEFT FOREARM  Final   Special Requests BOTTLES DRAWN AEROBIC  AND ANAEROBIC 5ML  Final   Culture   Final    NO GROWTH 1 DAY Performed at [facility]    Report Status PENDING  Incomplete  MRSA PCR Screening     Status: None   Collection Time: 03/31/15  8:40 AM  Result Value Ref Range Status   MRSA by PCR NEGATIVE NEGATIVE Final    Comment:        The GeneXpert MRSA Assay (FDA approved for NASAL specimens only), is one component of a comprehensive MRSA colonization surveillance program. It is not intended to diagnose MRSA infection nor to guide or monitor treatment for MRSA infections.      Scheduled Meds: . cefTRIAXone (ROCEPHIN)  IV  1 g Intravenous Q24H  . insulin aspart  0-5 Units Subcutaneous QHS  . insulin aspart  0-9 Units Subcutaneous TID WC  . metoprolol succinate  25 mg Oral BID  . rivaroxaban  20 mg Oral Daily  . sodium chloride  3 mL Intravenous Q12H   Continuous Infusions:   Marzetta Board, MD [facility] Pager [phone]. If 7 PM - 7 AM, please contact night-coverage at [email or web address], password [facility] 04/02/2015, 11:58 AM  LOS: 2 days

## 2015-04-02 NOTE — Progress Notes (Signed)
Patient continues to complain of feeling like she can't breathe when she lays down.  States she feels better when sitting up on the side of the bed.  Oxygen level continues to be in the high 90's on 2L.  Will continue to monitor patient.  Owens Shark, Emett Stapel Cherie

## 2015-04-03 LAB — BASIC METABOLIC PANEL
ANION GAP: 6 (ref 5–15)
BUN: 29 mg/dL — ABNORMAL HIGH (ref 6–20)
CHLORIDE: 110 mmol/L (ref 101–111)
CO2: 24 mmol/L (ref 22–32)
CREATININE: 0.65 mg/dL (ref 0.44–1.00)
Calcium: 8.7 mg/dL — ABNORMAL LOW (ref 8.9–10.3)
GFR calc non Af Amer: >60 mL/min (ref >60)
GLUCOSE: 168 mg/dL — AB (ref 65–99)
Potassium: 3.9 mmol/L (ref 3.5–5.1)
Sodium: 140 mmol/L (ref 135–145)

## 2015-04-03 LAB — GLUCOSE, CAPILLARY
GLUCOSE-CAPILLARY: 182 mg/dL — AB (ref 65–99)
GLUCOSE-CAPILLARY: 191 mg/dL — AB (ref 65–99)
Glucose-Capillary: 146 mg/dL — ABNORMAL HIGH (ref 65–99)
Glucose-Capillary: 147 mg/dL — ABNORMAL HIGH (ref 65–99)

## 2015-04-03 LAB — URINE CULTURE: Culture: >=100000

## 2015-04-03 LAB — CBC
HEMATOCRIT: 33.6 % — AB (ref 36.0–46.0)
HEMOGLOBIN: 10.5 g/dL — AB (ref 12.0–15.0)
MCH: 28.2 pg (ref 26.0–34.0)
MCHC: 31.3 g/dL (ref 30.0–36.0)
MCV: 90.3 fL (ref 78.0–100.0)
Platelets: 337 10*3/uL (ref 150–400)
RBC: 3.72 MIL/uL — ABNORMAL LOW (ref 3.87–5.11)
RDW: 16.6 % — ABNORMAL HIGH (ref 11.5–15.5)
WBC: 6.8 10*3/uL (ref 4.0–10.5)

## 2015-04-03 MED ORDER — FUROSEMIDE 10 MG/ML IJ SOLN
40.0000 mg | Freq: Once | INTRAMUSCULAR | Status: AC
  Administered 2015-04-03: 40 mg via INTRAVENOUS
  Filled 2015-04-03: qty 4

## 2015-04-03 MED ORDER — CEPHALEXIN 500 MG PO CAPS
500.0000 mg | ORAL_CAPSULE | Freq: Three times a day (TID) | ORAL | Status: DC
  Administered 2015-04-03 – 2015-04-05 (×7): 500 mg via ORAL
  Filled 2015-04-03 (×8): qty 1

## 2015-04-03 NOTE — Progress Notes (Signed)
Patient Name: [name] Date of Encounter: 04/03/2015     Principal Problem:   Sepsis Active Problems:   Hypotension   Diabetes mellitus type 2, noninsulin dependent   UTI (lower urinary tract infection)   Atrial fibrillation with rapid ventricular response   Near syncope    SUBJECTIVE  Did not sleep very well last night due to nursing staff obtaining vitals. Breathing better, not not back to baseline.   CURRENT MEDS . cefTRIAXone (ROCEPHIN)  IV  1 g Intravenous Q24H  . insulin aspart  0-5 Units Subcutaneous QHS  . insulin aspart  0-9 Units Subcutaneous TID WC  . metoprolol succinate  25 mg Oral BID  . rivaroxaban  20 mg Oral Daily  . sodium chloride  3 mL Intravenous Q12H    OBJECTIVE  Filed Vitals:   04/02/15 1456 04/02/15 2041 04/03/15 0210 04/03/15 0518  BP: 130/72 125/76 134/66 105/77  Pulse: 90 112 92 84  Temp: 98 F (36.7 C) 98.4 F (36.9 C) 97.9 F (36.6 C) 98.1 F (36.7 C)  TempSrc: Oral Oral Oral Oral  Resp: 18 20 20 18   Height:      Weight:    206 lb 5.6 oz (93.6 kg)  SpO2: 92% 95% 92% 91%    Intake/Output Summary (Last 24 hours) at 04/03/15 0924 Last data filed at 04/03/15 0700  Gross per 24 hour  Intake 218.83 ml  Output   2450 ml  Net -2231.17 ml   Filed Weights   04/01/15 0135 04/02/15 0604 04/03/15 0518  Weight: 192 lb 10.9 oz (87.4 kg) 185 lb 6.5 oz (84.1 kg) 206 lb 5.6 oz (93.6 kg)    PHYSICAL EXAM  General: Pleasant, NAD. Neuro: Alert and oriented X 3. Moves all extremities spontaneously. Psych: Normal affect. HEENT:  Normal  Neck: Supple without bruits. +mild JVD on right Lungs:  Resp regular and unlabored. Decreased breath sound in bilateral bases.  Heart: irregular. no s3, s4, or murmurs. Abdomen: Soft, non-tender, non-distended, BS + x 4.  Extremities: No clubbing, cyanosis. DP/PT/Radials 2+ and equal bilaterally. Only 1+ pitting edema on RLE, LLE no edema  Accessory Clinical Findings  CBC  Recent Labs  04/02/15 0431 04/03/15 0505  WBC 8.3 6.8  HGB 10.5* 10.5*  HCT 34.2* 33.6*  MCV 91.0 90.3  PLT 357 299   Basic Metabolic Panel  Recent Labs  04/02/15 0431 04/03/15 0505  NA 139 140  K 4.1 3.9  CL 111 110  CO2 21* 24  GLUCOSE 122* 168*  BUN 33* 29*  CREATININE 0.73 0.65  CALCIUM 8.5* 8.7*   Cardiac Enzymes  Recent Labs  03/31/15 1034 03/31/15 1450 04/01/15 0413  TROPONINI 0.17* 0.19* 0.24*   Thyroid Function Tests  Recent Labs  04/01/15 1315  TSH 6.084*    TELE Atrial fibrillation with high 90s    ECG  No new EKG  Echocardiogram 03/31/2015  LV EF: 55% -  60%  ------------------------------------------------------------------- Indications:   Atrial fibrillation - chronic 427.31.  ------------------------------------------------------------------- History:  PMH:  Hypertrophic cardiomyopathy. Mitral valve prolapse. Risk factors: Diabetes mellitus.  ------------------------------------------------------------------- Study Conclusions  - Left ventricle: The cavity size was normal. Systolic function was normal. The estimated ejection fraction was in the range of 55% to 60%. Wall motion was normal; there were no regional wall motion abnormalities. The study was not technically sufficient to allow evaluation of LV diastolic dysfunction due to atrial fibrillation. Doppler parameters are consistent with both elevated ventricular end-diastolic filling pressure and elevated  left atrial filling pressure. However, given elevated LVEDP and LA pressures with severe left atrial enlargement, diastolic dysfunction is likely. Mild concentric and severe focal basal septal hypertrophy. - Mitral valve: Moderately calcified annulus. Mildly thickened leaflets . There was moderate regurgitation. - Left atrium: The atrium was severely dilated. - Right ventricle: Systolic function was moderately reduced. - Right atrium: The atrium was  mildly to moderately dilated. - Tricuspid valve: There was moderate-severe regurgitation. - Pulmonic valve: There was mild regurgitation. - Pulmonary arteries: PASP 40 mmHg. Systolic pressure was mildly increased.    Radiology/Studies  Dg Chest Port 1 View  03/31/2015   CLINICAL DATA:  Weakness and shortness of breath. Near syncope. Bilateral lower extremity swelling.  EXAM: PORTABLE CHEST - 1 VIEW  COMPARISON:  01/01/2013  FINDINGS: Cardiomegaly is again seen. There is progressive interstitial and perihilar opacities concerning for pulmonary edema. Mild atelectasis at the right lung base. No confluent airspace disease. Questionable blunting of left costophrenic angle. No pneumothorax.  IMPRESSION: Findings suspicious for CHF.   Electronically Signed   By: Jeb Levering M.D.   On: 03/31/2015 01:21    ASSESSMENT AND PLAN  69 yo female with PMH of atrial fibrillation admitted for urosepsis and near syncope, found to be in rapid afib and also noted to have slightly elevated trop.   1. Paroxysmal atrial fibrillation with RVR  - CHA2DS2-Vasc score 3 (female, age, DM)  - Echo 03/31/2015 EF 55-60%, no RWMA, severe LA enlargement, mild concentric and severe focal basal septal hypertrophy   - TSH mildly elevated. Currently on Toprol Xl 25mg  BID and Xarelto with HR high 90s. SBP 100-130s, if possible, uptitrating toprol XL to 37.5 may be beneficial, BP is a concern.   2. Demand ischemia: outpatient myoview  3. Acute on chronic diastolic HF: given IV diuresis  - likely close to baseline, only little fluid left, will give 1 dose of 40mg  IV lasix today which should be enough to get her back to baseline  4. Urosepsis: per IM  5. DM 6. Severe focal basal septal hypertrophy 7. Moderate MR 8. Moderate to severe TR  Signed, Woodward Ku Pager: [phone] Agree with above. Will keep Toprol at present dose given soft BP. She is pleased not to have to wear nasal oxygen. Extra lasix today. Should  be ready to go home tomorrow.

## 2015-04-03 NOTE — Progress Notes (Signed)
PROGRESS NOTE  [name] BJY:[id number] DOB: Mar 31, 1946 DOA: 03/30/2015 PCP: Lujean Amel, MD   HPI: 69 yo F admitted on 8/27 with A fib with RVR in the setting of sepsis due to UTI. Patient was initially admitted to SDU and cardiology was consulted. Her rates have improved with beta blockers and she was transferred to floor on 8/28. She has remained stable since, she is getting IV diuresis per cardiology, if she is improving she should be able to go home on 8/31  Subjective / 24 H Interval events - She is breathing better today, happy that she no longer has to use the oxygen  Assessment/Plan: Principal Problem:   Sepsis Active Problems:   Hypotension   Diabetes mellitus type 2, noninsulin dependent   UTI (lower urinary tract infection)   Atrial fibrillation with rapid ventricular response   Near syncope   A fib with RVR - rates improved on Metoprolol - cardiology following - Continue Xarelto  Troponin elevation  - likely demand given infection and A fib with RVR, overall flat  - cardiology following  UTI - She was initially started on ceftriaxone, urine culture speciated Klebsiella on 8/30, narrow her antibiotics to Keflex based on sensitivities  Acute on chronic diastolic CHF - repeat 2D echo as below  - she is getting diuresed per cardiology, she is approaching baseline volume status  DM - patient insists on regular diet - continue SSI - A1C 7.8 showing poor control  Medication non compliance - she was supposed to be on Amiodarone and Xarelto, self discontinued 2 years ago and has not taken any medications or seen any MD since   Diet: Diet regular Room service appropriate?: Yes; Fluid consistency:: Thin Fluids: none DVT Prophylaxis: heparin gtt  Code Status: Full Code Family Communication: no family bedisde  Disposition Plan: Home 24/48 hours   Consultants:  Cardiology   Procedures:  2D echo Study Conclusions - Left ventricle: The cavity  size was normal. Systolic function was normal. The estimated ejection fraction was in the range of 55%to 60%. Wall motion was normal; there were no regional wallmotion abnormalities. The study was not technically sufficient toallow evaluation of LV diastolic dysfunction due to atrialfibrillation. Doppler parameters are consistent with bothelevated ventricular end-diastolic filling pressure and elevatedleft atrial filling pressure. However, given elevated LVEDP andLA pressures with severe left atrial enlargement, diastolicdysfunction is likely. Mild concentric and severe focal basalseptal hypertrophy. - Mitral valve: Moderately calcified annulus. Mildly thickenedleaflets . There was moderate regurgitation. - Left atrium: The atrium was severely dilated. - Right ventricle: Systolic function was moderately reduced. - Right atrium: The atrium was mildly to moderately dilated. - Tricuspid valve: There was moderate-severe regurgitation. - Pulmonic valve: There was mild regurgitation. - Pulmonary arteries: PASP 40 mmHg. Systolic pressure was mildlyincreased.   Antibiotics  Anti-infectives    Start     Dose/Rate Route Frequency Ordered Stop   04/01/15 0200  cefTRIAXone (ROCEPHIN) 1 g in dextrose 5 % 50 mL IVPB     1 g 100 mL/hr over 30 Minutes Intravenous Every 24 hours 03/31/15 0246     03/31/15 0130  cefTRIAXone (ROCEPHIN) 1 g in dextrose 5 % 50 mL IVPB     1 g 100 mL/hr over 30 Minutes Intravenous  Once 03/31/15 0124 03/31/15 0345       Studies  No results found.  Objective  Filed Vitals:   04/03/15 0210 04/03/15 0518 04/03/15 0943 04/03/15 1000  BP: 134/66 105/77 133/84   Pulse: 92 84  100   Temp: 97.9 F (36.6 C) 98.1 F (36.7 C) 97.5 F (36.4 C)   TempSrc: Oral Oral Oral   Resp: 20 18 18    Height:      Weight:  93.6 kg (206 lb 5.6 oz)  83.5 kg (184 lb 1.4 oz)  SpO2: 92% 91% 94%     Intake/Output Summary (Last 24 hours) at 04/03/15 1529 Last data filed at 04/03/15  0700  Gross per 24 hour  Intake     50 ml  Output   1050 ml  Net  -1000 ml   Filed Weights   04/02/15 0604 04/03/15 0518 04/03/15 1000  Weight: 84.1 kg (185 lb 6.5 oz) 93.6 kg (206 lb 5.6 oz) 83.5 kg (184 lb 1.4 oz)   Exam:  GENERAL: NAD  HEENT: head NCAT, no scleral icterus. Pupils round and reactive.  LUNGS: Clear to auscultation. No wheezing or crackles  HEART: irregular, without murmurs. 2+ pulses, no JVD, no peripheral edema  ABDOMEN: Soft, nontender, and nondistended. Positive bowel sounds. No hepatosplenomegaly was noted.  EXTREMITIES: Without any cyanosis, clubbing, rash, lesions or edema.  NEUROLOGIC: Alert and oriented x3. Strength 5/5 in all 4.  PSYCHIATRIC: Normal mood and affect  Data Reviewed: Basic Metabolic Panel:  Recent Labs Lab 03/30/15 2344 03/31/15 0007 03/31/15 0653 04/02/15 0431 04/03/15 0505  NA 141 142 140 139 140  K 3.7 3.6 4.4 4.1 3.9  CL 111 109 111 111 110  CO2 20*  --  20* 21* 24  GLUCOSE 187* 186* 155* 122* 168*  BUN 43* 39* 42* 33* 29*  CREATININE 0.99 0.90 1.04* 0.73 0.65  CALCIUM 8.5*  --  8.3* 8.5* 8.7*   Liver Function Tests:  Recent Labs Lab 03/30/15 2344  AST 34  ALT 48  ALKPHOS 124  BILITOT 0.7  PROT 6.1*  ALBUMIN 3.5   CBC:  Recent Labs Lab 03/30/15 2344 03/31/15 0007 03/31/15 0653 04/01/15 0413 04/02/15 0431 04/03/15 0505  WBC 10.4  --  10.8* 9.1 8.3 6.8  NEUTROABS 7.4  --   --   --   --   --   HGB 11.2* 12.2 11.0* 10.3* 10.5* 10.5*  HCT 37.1 36.0 35.6* 33.2* 34.2* 33.6*  MCV 89.0  --  89.7 91.0 91.0 90.3  PLT 344  --  330 330 357 337   Cardiac Enzymes:  Recent Labs Lab 03/30/15 2344 03/31/15 0259 03/31/15 1034 03/31/15 1450 04/01/15 0413  TROPONINI 0.05* 0.07* 0.17* 0.19* 0.24*   BNP (last 3 results)  Recent Labs  03/30/15 2344  BNP 630.5*   CBG:  Recent Labs Lab 04/02/15 1159 04/02/15 1636 04/02/15 2039 04/03/15 0718 04/03/15 1146  GLUCAP 113* 167* 220* 146* 147*     Recent Results (from the past 240 hour(s))  Culture, blood (x 2)     Status: None (Preliminary result)   Collection Time: 03/30/15 11:44 PM  Result Value Ref Range Status   Specimen Description BLOOD RIGHT ARM  Final   Special Requests BOTTLES DRAWN AEROBIC AND ANAEROBIC 5ML  Final   Culture   Final    NO GROWTH 3 DAYS Performed at [facility]    Report Status PENDING  Incomplete  Urine culture     Status: None   Collection Time: 03/31/15 12:16 AM  Result Value Ref Range Status   Specimen Description URINE, CLEAN CATCH  Final   Special Requests NONE  Final   Culture   Final    >=100,000 COLONIES/mL KLEBSIELLA PNEUMONIAE  Performed at [facility]    Report Status 04/03/2015 FINAL  Final   Organism ID, Bacteria KLEBSIELLA PNEUMONIAE  Final      Susceptibility   Klebsiella pneumoniae - MIC*    AMPICILLIN >=32 RESISTANT Resistant     CEFAZOLIN <=4 SENSITIVE Sensitive     CEFTRIAXONE <=1 SENSITIVE Sensitive     CIPROFLOXACIN <=0.25 SENSITIVE Sensitive     GENTAMICIN <=1 SENSITIVE Sensitive     IMIPENEM <=0.25 SENSITIVE Sensitive     NITROFURANTOIN 64 INTERMEDIATE Intermediate     TRIMETH/SULFA <=20 SENSITIVE Sensitive     AMPICILLIN/SULBACTAM 4 SENSITIVE Sensitive     PIP/TAZO <=4 SENSITIVE Sensitive     * >=100,000 COLONIES/mL KLEBSIELLA PNEUMONIAE  Culture, blood (x 2)     Status: None (Preliminary result)   Collection Time: 03/31/15  3:10 AM  Result Value Ref Range Status   Specimen Description BLOOD LEFT FOREARM  Final   Special Requests BOTTLES DRAWN AEROBIC AND ANAEROBIC 5ML  Final   Culture   Final    NO GROWTH 3 DAYS Performed at [facility]    Report Status PENDING  Incomplete  MRSA PCR Screening     Status: None   Collection Time: 03/31/15  8:40 AM  Result Value Ref Range Status   MRSA by PCR NEGATIVE NEGATIVE Final    Comment:        The GeneXpert MRSA Assay (FDA approved for NASAL specimens only), is one component of  a comprehensive MRSA colonization surveillance program. It is not intended to diagnose MRSA infection nor to guide or monitor treatment for MRSA infections.      Scheduled Meds: . cefTRIAXone (ROCEPHIN)  IV  1 g Intravenous Q24H  . insulin aspart  0-5 Units Subcutaneous QHS  . insulin aspart  0-9 Units Subcutaneous TID WC  . metoprolol succinate  25 mg Oral BID  . rivaroxaban  20 mg Oral Daily  . sodium chloride  3 mL Intravenous Q12H   Continuous Infusions:   Marzetta Board, MD [facility] Pager [phone]. If 7 PM - 7 AM, please contact night-coverage at [email or web address], password [facility] 04/03/2015, 3:29 PM  LOS: 3 days

## 2015-04-03 NOTE — Care Management Note (Signed)
Case Management Note  Patient Details  Name: MARZELLE RUTTEN MRN: [id number] Date of Birth: Nov 08, 1945  Subjective/Objective:69 y/o f admitted w/CHF, Sepsis. From home.                    Action/Plan:d/c plan home.   Expected Discharge Date:                 Expected Discharge Plan:  Home/Self Care  In-House Referral:     Discharge planning Services  CM Consult  Post Acute Care Choice:    Choice offered to:     DME Arranged:    DME Agency:     HH Arranged:    HH Agency:     Status of Service:  In process, will continue to follow  Medicare Important Message Given:  Yes-second notification given Date Medicare IM Given:    Medicare IM give by:    Date Additional Medicare IM Given:    Additional Medicare Important Message give by:     If discussed at [place] of Stay Meetings, dates discussed:    Additional Comments:  Dessa Phi, RN 04/03/2015, 2:14 PM

## 2015-04-04 ENCOUNTER — Other Ambulatory Visit: Payer: Self-pay | Admitting: Physician Assistant

## 2015-04-04 DIAGNOSIS — I5033 Acute on chronic diastolic (congestive) heart failure: Secondary | ICD-10-CM

## 2015-04-04 LAB — CBC
HCT: 33.8 % — ABNORMAL LOW (ref 36.0–46.0)
Hemoglobin: 10.4 g/dL — ABNORMAL LOW (ref 12.0–15.0)
MCH: 27.5 pg (ref 26.0–34.0)
MCHC: 30.8 g/dL (ref 30.0–36.0)
MCV: 89.4 fL (ref 78.0–100.0)
PLATELETS: 331 10*3/uL (ref 150–400)
RBC: 3.78 MIL/uL — AB (ref 3.87–5.11)
RDW: 16.5 % — ABNORMAL HIGH (ref 11.5–15.5)
WBC: 7.5 10*3/uL (ref 4.0–10.5)

## 2015-04-04 LAB — BASIC METABOLIC PANEL
Anion gap: 10 (ref 5–15)
BUN: 26 mg/dL — AB (ref 6–20)
CHLORIDE: 108 mmol/L (ref 101–111)
CO2: 24 mmol/L (ref 22–32)
CREATININE: 0.51 mg/dL (ref 0.44–1.00)
Calcium: 9 mg/dL (ref 8.9–10.3)
GFR calc Af Amer: >60 mL/min (ref >60)
GLUCOSE: 171 mg/dL — AB (ref 65–99)
POTASSIUM: 3.5 mmol/L (ref 3.5–5.1)
Sodium: 142 mmol/L (ref 135–145)

## 2015-04-04 LAB — GLUCOSE, CAPILLARY
GLUCOSE-CAPILLARY: 150 mg/dL — AB (ref 65–99)
Glucose-Capillary: 147 mg/dL — ABNORMAL HIGH (ref 65–99)
Glucose-Capillary: 148 mg/dL — ABNORMAL HIGH (ref 65–99)
Glucose-Capillary: 160 mg/dL — ABNORMAL HIGH (ref 65–99)

## 2015-04-04 MED ORDER — FUROSEMIDE 10 MG/ML IJ SOLN
40.0000 mg | Freq: Once | INTRAMUSCULAR | Status: AC
  Administered 2015-04-04: 40 mg via INTRAVENOUS
  Filled 2015-04-04: qty 4

## 2015-04-04 MED ORDER — TAMSULOSIN HCL 0.4 MG PO CAPS
0.4000 mg | ORAL_CAPSULE | Freq: Every day | ORAL | Status: DC
  Filled 2015-04-04 (×2): qty 1

## 2015-04-04 MED ORDER — FUROSEMIDE 40 MG PO TABS
40.0000 mg | ORAL_TABLET | Freq: Every day | ORAL | Status: DC
  Administered 2015-04-05: 40 mg via ORAL
  Filled 2015-04-04: qty 1

## 2015-04-04 MED ORDER — METOPROLOL SUCCINATE ER 25 MG PO TB24
37.5000 mg | ORAL_TABLET | Freq: Two times a day (BID) | ORAL | Status: DC
  Administered 2015-04-04 – 2015-04-05 (×2): 37.5 mg via ORAL
  Filled 2015-04-04 (×2): qty 2

## 2015-04-04 NOTE — Progress Notes (Signed)
Patient Name: [name] Date of Encounter: 04/04/2015     Principal Problem:   Sepsis Active Problems:   Hypotension   Diabetes mellitus type 2, noninsulin dependent   UTI (lower urinary tract infection)   Atrial fibrillation with rapid ventricular response   Near syncope    SUBJECTIVE  Did not sleep very well last night again. SOB improved, still has a little RLE swelling.  CURRENT MEDS . cephALEXin  500 mg Oral 3 times per day  . insulin aspart  0-5 Units Subcutaneous QHS  . insulin aspart  0-9 Units Subcutaneous TID WC  . metoprolol succinate  25 mg Oral BID  . rivaroxaban  20 mg Oral Daily  . sodium chloride  3 mL Intravenous Q12H    OBJECTIVE  Filed Vitals:   04/03/15 1500 04/03/15 2111 04/03/15 2154 04/04/15 0529  BP: 128/95 131/87 143/75 124/70  Pulse: 86 94 96 94  Temp:   97.8 F (36.6 C) 98.1 F (36.7 C)  TempSrc:   Oral Oral  Resp:   16 20  Height:      Weight:    181 lb 7 oz (82.3 kg)  SpO2: 94%  95% 94%    Intake/Output Summary (Last 24 hours) at 04/04/15 1051 Last data filed at 04/04/15 0252  Gross per 24 hour  Intake      3 ml  Output   2100 ml  Net  -2097 ml   Filed Weights   04/03/15 0518 04/03/15 1000 04/04/15 0529  Weight: 206 lb 5.6 oz (93.6 kg) 184 lb 1.4 oz (83.5 kg) 181 lb 7 oz (82.3 kg)    PHYSICAL EXAM  General: Pleasant, NAD. Neuro: Alert and oriented X 3. Moves all extremities spontaneously. Psych: Normal affect. HEENT:  Normal  Neck: Supple without bruits. Lungs:  Resp regular and unlabored. R base moving air better, still has L basilar rale.  Heart: irregular. no s3, s4, or murmurs. Abdomen: Soft, non-tender, non-distended, BS + x 4.  Extremities: No clubbing, cyanosis. DP/PT/Radials 2+ and equal bilaterally. Only 1+ pitting edema on RLE, LLE no edema  Accessory Clinical Findings  CBC  Recent Labs  04/03/15 0505 04/04/15 0605  WBC 6.8 7.5  HGB 10.5* 10.4*  HCT 33.6* 33.8*  MCV 90.3 89.4  PLT 337 331     Basic Metabolic Panel  Recent Labs  04/03/15 0505 04/04/15 0605  NA 140 142  K 3.9 3.5  CL 110 108  CO2 24 24  GLUCOSE 168* 171*  BUN 29* 26*  CREATININE 0.65 0.51  CALCIUM 8.7* 9.0   Thyroid Function Tests  Recent Labs  04/01/15 1315  TSH 6.084*    TELE Atrial fibrillation with high 90s    ECG  No new EKG  Echocardiogram 03/31/2015  LV EF: 55% -  60%  ------------------------------------------------------------------- Indications:   Atrial fibrillation - chronic 427.31.  ------------------------------------------------------------------- History:  PMH:  Hypertrophic cardiomyopathy. Mitral valve prolapse. Risk factors: Diabetes mellitus.  ------------------------------------------------------------------- Study Conclusions  - Left ventricle: The cavity size was normal. Systolic function was normal. The estimated ejection fraction was in the range of 55% to 60%. Wall motion was normal; there were no regional wall motion abnormalities. The study was not technically sufficient to allow evaluation of LV diastolic dysfunction due to atrial fibrillation. Doppler parameters are consistent with both elevated ventricular end-diastolic filling pressure and elevated left atrial filling pressure. However, given elevated LVEDP and LA pressures with severe left atrial enlargement, diastolic dysfunction is likely. Mild concentric  and severe focal basal septal hypertrophy. - Mitral valve: Moderately calcified annulus. Mildly thickened leaflets . There was moderate regurgitation. - Left atrium: The atrium was severely dilated. - Right ventricle: Systolic function was moderately reduced. - Right atrium: The atrium was mildly to moderately dilated. - Tricuspid valve: There was moderate-severe regurgitation. - Pulmonic valve: There was mild regurgitation. - Pulmonary arteries: PASP 40 mmHg. Systolic pressure was mildly increased.     Radiology/Studies  Dg Chest Port 1 View  03/31/2015   CLINICAL DATA:  Weakness and shortness of breath. Near syncope. Bilateral lower extremity swelling.  EXAM: PORTABLE CHEST - 1 VIEW  COMPARISON:  01/01/2013  FINDINGS: Cardiomegaly is again seen. There is progressive interstitial and perihilar opacities concerning for pulmonary edema. Mild atelectasis at the right lung base. No confluent airspace disease. Questionable blunting of left costophrenic angle. No pneumothorax.  IMPRESSION: Findings suspicious for CHF.   Electronically Signed   By: Jeb Levering M.D.   On: 03/31/2015 01:21    ASSESSMENT AND PLAN  69 yo female with PMH of atrial fibrillation admitted for urosepsis and near syncope, found to be in rapid afib and also noted to have slightly elevated trop.   1. Paroxysmal atrial fibrillation with RVR  - CHA2DS2-Vasc score 3 (female, age, DM)  - Echo 03/31/2015 EF 55-60%, no RWMA, severe LA enlargement, mild concentric and severe focal basal septal hypertrophy   - TSH mildly elevated. Currently on Toprol Xl 25mg  BID and Xarelto with HR high 90s. SBP 100-130s. BP now 120-140s, consider increase Toprol XL to 37.5 BID.    2. Demand ischemia: outpatient myoview  3. Acute on chronic diastolic HF: given IV diuresis  - R base sounds better today, L base still has diminished sound with rale. Lost another 3 lbs, now weight 181. Discussed fluid and Na restriction and daily weight monitoring. Will tentatively transition to PO lasix tomorrow.   - some urinary retention last night after folley removal. Hopefully will improve. Can be discharged once urinary retention improve. I will arrange followup with Dr. Mare Ferrari  4. Urosepsis: per IM  5. DM 6. Severe focal basal septal hypertrophy 7. Moderate MR 8. Moderate to severe TR  Signed, Woodward Ku Pager: [phone] Agree with above assessment. Will increase toprol to 37.5 mg BID for better rate control. Hopefully home in am.

## 2015-04-04 NOTE — Progress Notes (Signed)
Patient had not voided since foley was removed. Bladder scan revealed 364 ml. After being scanned patient want to try to void on own one more time but was unsuccessful. In and out cath was performed and yielded 400 ml.

## 2015-04-04 NOTE — Progress Notes (Signed)
PROGRESS NOTE  [name] JFH:[id number] DOB: [birth year] DOA: 03/30/2015 PCP: Lujean Amel, MD   HPI: 69 yo F admitted on 8/27 with A fib with RVR in the setting of sepsis due to UTI. Patient was initially admitted to SDU and cardiology was consulted. Her rates have improved with beta blockers and she was transferred to floor on 8/28. She has remained stable since, she is getting IV diuresis per cardiology, if she is improving she should be able to go home on 8/31  Subjective / 24 H Interval events Patient is off oxygen, able to urinate on her own this morning denies shortness of breath  Assessment/Plan: Principal Problem:   Sepsis Active Problems:   Hypotension   Diabetes mellitus type 2, noninsulin dependent   UTI (lower urinary tract infection)   Atrial fibrillation with rapid ventricular response   Near syncope   A fib with RVR CHA2DS2-Vasc score 3 (female, age, DM) - Echo 03/31/2015 EF 55-60%, no RWMA, severe LA enlargement, mild concentric and severe focal basal septal hypertrophy  - TSH mildly elevated. Currently on Toprol Xl 25mg  BID and Xarelto with HR high 90s. SBP 100-130s. BP now 120-140s, consider increase Toprol XL to 37.5 BID.     Troponin elevation  - likely demand given infection and A fib with RVR, overall flat  - cardiology following, patient will need outpatient Myoview  UTI - She was initially started on ceftriaxone, urine culture speciated Klebsiella on 8/30, narrow her antibiotics to Keflex based on sensitivities, will continue for another 5 days  Acute on chronic diastolic CHF - repeat 2D echo as below  - she is getting diuresed per cardiology, she is approaching baseline volume status  DM controlled - patient insists on regular diet - continue SSI - A1C 7.8 showing poor control  Medication non compliance - she was supposed to be on Amiodarone and Xarelto, self discontinued 2 years ago and has not taken any  medications or seen any MD since   Diet: Diet regular Room service appropriate?: Yes; Fluid consistency:: Thin Fluids: none DVT Prophylaxis: heparin gtt  Code Status: Full Code Family Communication: no family bedisde  Disposition Plan: Home 24/48 hours   Consultants:  Cardiology   Procedures:  2D echo Study Conclusions - Left ventricle: The cavity size was normal. Systolic function was normal. The estimated ejection fraction was in the range of 55%to 60%. Wall motion was normal; there were no regional wallmotion abnormalities. The study was not technically sufficient toallow evaluation of LV diastolic dysfunction due to atrialfibrillation. Doppler parameters are consistent with bothelevated ventricular end-diastolic filling pressure and elevatedleft atrial filling pressure. However, given elevated LVEDP andLA pressures with severe left atrial enlargement, diastolicdysfunction is likely. Mild concentric and severe focal basalseptal hypertrophy. - Mitral valve: Moderately calcified annulus. Mildly thickenedleaflets . There was moderate regurgitation. - Left atrium: The atrium was severely dilated. - Right ventricle: Systolic function was moderately reduced. - Right atrium: The atrium was mildly to moderately dilated. - Tricuspid valve: There was moderate-severe regurgitation. - Pulmonic valve: There was mild regurgitation. - Pulmonary arteries: PASP 40 mmHg. Systolic pressure was mildlyincreased.   Antibiotics  Anti-infectives    Start     Dose/Rate Route Frequency Ordered Stop   04/03/15 1600  cephALEXin (KEFLEX) capsule 500 mg     500 mg Oral 3 times per day 04/03/15 1531     04/01/15 0200  cefTRIAXone (ROCEPHIN) 1 g in dextrose 5 % 50 mL IVPB  Status:  Discontinued  1 g 100 mL/hr over 30 Minutes Intravenous Every 24 hours 03/31/15 0246 04/03/15 1531   03/31/15 0130  cefTRIAXone (ROCEPHIN) 1 g in dextrose 5 % 50 mL IVPB     1 g 100 mL/hr over 30 Minutes  Intravenous  Once 03/31/15 0124 03/31/15 0345       Studies  No results found.  Objective  Filed Vitals:   04/03/15 1500 04/03/15 2111 04/03/15 2154 04/04/15 0529  BP: 128/95 131/87 143/75 124/70  Pulse: 86 94 96 94  Temp:   97.8 F (36.6 C) 98.1 F (36.7 C)  TempSrc:   Oral Oral  Resp:   16 20  Height:      Weight:    82.3 kg (181 lb 7 oz)  SpO2: 94%  95% 94%    Intake/Output Summary (Last 24 hours) at 04/04/15 1253 Last data filed at 04/04/15 0900  Gross per 24 hour  Intake    243 ml  Output   2100 ml  Net  -1857 ml   Filed Weights   04/03/15 0518 04/03/15 1000 04/04/15 0529  Weight: 93.6 kg (206 lb 5.6 oz) 83.5 kg (184 lb 1.4 oz) 82.3 kg (181 lb 7 oz)   Exam:  GENERAL: NAD  HEENT: head NCAT, no scleral icterus. Pupils round and reactive.  LUNGS: Clear to auscultation. No wheezing or crackles  HEART: irregular, without murmurs. 2+ pulses, no JVD, no peripheral edema  ABDOMEN: Soft, nontender, and nondistended. Positive bowel sounds. No hepatosplenomegaly was noted.  EXTREMITIES: Without any cyanosis, clubbing, rash, lesions or edema.  NEUROLOGIC: Alert and oriented x3. Strength 5/5 in all 4.  PSYCHIATRIC: Normal mood and affect  Data Reviewed: Basic Metabolic Panel:  Recent Labs Lab 03/30/15 2344 03/31/15 0007 03/31/15 0653 04/02/15 0431 04/03/15 0505 04/04/15 0605  NA 141 142 140 139 140 142  K 3.7 3.6 4.4 4.1 3.9 3.5  CL 111 109 111 111 110 108  CO2 20*  --  20* 21* 24 24  GLUCOSE 187* 186* 155* 122* 168* 171*  BUN 43* 39* 42* 33* 29* 26*  CREATININE 0.99 0.90 1.04* 0.73 0.65 0.51  CALCIUM 8.5*  --  8.3* 8.5* 8.7* 9.0   Liver Function Tests:  Recent Labs Lab 03/30/15 2344  AST 34  ALT 48  ALKPHOS 124  BILITOT 0.7  PROT 6.1*  ALBUMIN 3.5   CBC:  Recent Labs Lab 03/30/15 2344  03/31/15 0653 04/01/15 0413 04/02/15 0431 04/03/15 0505 04/04/15 0605  WBC 10.4  --  10.8* 9.1 8.3 6.8 7.5  NEUTROABS 7.4  --   --   --   --    --   --   HGB 11.2*  < > 11.0* 10.3* 10.5* 10.5* 10.4*  HCT 37.1  < > 35.6* 33.2* 34.2* 33.6* 33.8*  MCV 89.0  --  89.7 91.0 91.0 90.3 89.4  PLT 344  --  330 330 357 337 331  < > = values in this interval not displayed. Cardiac Enzymes:  Recent Labs Lab 03/30/15 2344 03/31/15 0259 03/31/15 1034 03/31/15 1450 04/01/15 0413  TROPONINI 0.05* 0.07* 0.17* 0.19* 0.24*   BNP (last 3 results)  Recent Labs  03/30/15 2344  BNP 630.5*   CBG:  Recent Labs Lab 04/03/15 1146 04/03/15 1732 04/03/15 2153 04/04/15 0738 04/04/15 1130  GLUCAP 147* 182* 191* 147* 148*    Recent Results (from the past 240 hour(s))  Culture, blood (x 2)     Status: None (Preliminary result)   Collection Time:  03/30/15 11:44 PM  Result Value Ref Range Status   Specimen Description BLOOD RIGHT ARM  Final   Special Requests BOTTLES DRAWN AEROBIC AND ANAEROBIC 5ML  Final   Culture   Final    NO GROWTH 4 DAYS Performed at [facility]    Report Status PENDING  Incomplete  Urine culture     Status: None   Collection Time: 03/31/15 12:16 AM  Result Value Ref Range Status   Specimen Description URINE, CLEAN CATCH  Final   Special Requests NONE  Final   Culture   Final    >=100,000 COLONIES/mL KLEBSIELLA PNEUMONIAE Performed at [facility]    Report Status 04/03/2015 FINAL  Final   Organism ID, Bacteria KLEBSIELLA PNEUMONIAE  Final      Susceptibility   Klebsiella pneumoniae - MIC*    AMPICILLIN >=32 RESISTANT Resistant     CEFAZOLIN <=4 SENSITIVE Sensitive     CEFTRIAXONE <=1 SENSITIVE Sensitive     CIPROFLOXACIN <=0.25 SENSITIVE Sensitive     GENTAMICIN <=1 SENSITIVE Sensitive     IMIPENEM <=0.25 SENSITIVE Sensitive     NITROFURANTOIN 64 INTERMEDIATE Intermediate     TRIMETH/SULFA <=20 SENSITIVE Sensitive     AMPICILLIN/SULBACTAM 4 SENSITIVE Sensitive     PIP/TAZO <=4 SENSITIVE Sensitive     * >=100,000 COLONIES/mL KLEBSIELLA PNEUMONIAE  Culture, blood (x 2)     Status: None  (Preliminary result)   Collection Time: 03/31/15  3:10 AM  Result Value Ref Range Status   Specimen Description BLOOD LEFT FOREARM  Final   Special Requests BOTTLES DRAWN AEROBIC AND ANAEROBIC 5ML  Final   Culture   Final    NO GROWTH 4 DAYS Performed at [facility]    Report Status PENDING  Incomplete  MRSA PCR Screening     Status: None   Collection Time: 03/31/15  8:40 AM  Result Value Ref Range Status   MRSA by PCR NEGATIVE NEGATIVE Final    Comment:        The GeneXpert MRSA Assay (FDA approved for NASAL specimens only), is one component of a comprehensive MRSA colonization surveillance program. It is not intended to diagnose MRSA infection nor to guide or monitor treatment for MRSA infections.      Scheduled Meds: . cephALEXin  500 mg Oral 3 times per day  . [START ON 04/05/2015] furosemide  40 mg Oral Daily  . insulin aspart  0-5 Units Subcutaneous QHS  . insulin aspart  0-9 Units Subcutaneous TID WC  . metoprolol succinate  37.5 mg Oral BID  . rivaroxaban  20 mg Oral Daily  . sodium chloride  3 mL Intravenous Q12H  . tamsulosin  0.4 mg Oral QPC breakfast   Continuous Infusions:   Marzetta Board, MD [facility] Pager [phone]. If 7 PM - 7 AM, please contact night-coverage at [email or web address], password [facility] 04/04/2015, 12:53 PM  LOS: 4 days

## 2015-04-05 LAB — BASIC METABOLIC PANEL
ANION GAP: 6 (ref 5–15)
BUN: 27 mg/dL — AB (ref 6–20)
CALCIUM: 9.1 mg/dL (ref 8.9–10.3)
CO2: 26 mmol/L (ref 22–32)
CREATININE: 0.56 mg/dL (ref 0.44–1.00)
Chloride: 108 mmol/L (ref 101–111)
GFR calc Af Amer: >60 mL/min (ref >60)
GLUCOSE: 170 mg/dL — AB (ref 65–99)
Potassium: 3.6 mmol/L (ref 3.5–5.1)
Sodium: 140 mmol/L (ref 135–145)

## 2015-04-05 LAB — CULTURE, BLOOD (ROUTINE X 2)
Culture: NO GROWTH
Culture: NO GROWTH

## 2015-04-05 LAB — CBC
HCT: 35.2 % — ABNORMAL LOW (ref 36.0–46.0)
Hemoglobin: 11.1 g/dL — ABNORMAL LOW (ref 12.0–15.0)
MCH: 28.2 pg (ref 26.0–34.0)
MCHC: 31.5 g/dL (ref 30.0–36.0)
MCV: 89.3 fL (ref 78.0–100.0)
PLATELETS: 360 10*3/uL (ref 150–400)
RBC: 3.94 MIL/uL (ref 3.87–5.11)
RDW: 16.2 % — AB (ref 11.5–15.5)
WBC: 7.6 10*3/uL (ref 4.0–10.5)

## 2015-04-05 LAB — GLUCOSE, CAPILLARY
GLUCOSE-CAPILLARY: 171 mg/dL — AB (ref 65–99)
Glucose-Capillary: 177 mg/dL — ABNORMAL HIGH (ref 65–99)
Glucose-Capillary: 150 mg/dL — ABNORMAL HIGH (ref 65–99)

## 2015-04-05 MED ORDER — CEPHALEXIN 500 MG PO CAPS: 500.0000 mg | ORAL_CAPSULE | Freq: Three times a day (TID) | ORAL | Status: DC

## 2015-04-05 MED ORDER — RIVAROXABAN 20 MG PO TABS: 20.0000 mg | ORAL_TABLET | Freq: Every day | ORAL | Status: DC

## 2015-04-05 MED ORDER — FUROSEMIDE 40 MG PO TABS: 40.0000 mg | ORAL_TABLET | Freq: Every day | ORAL | Status: DC

## 2015-04-05 MED ORDER — METOPROLOL SUCCINATE ER 25 MG PO TB24: 37.5000 mg | ORAL_TABLET | Freq: Two times a day (BID) | ORAL | Status: DC

## 2015-04-05 NOTE — Care Management Note (Signed)
Case Management Note  Patient Details  Name: KIANTE PETROVICH MRN: [id number] Date of Birth: [birth year]  Subjective/Objective:                    Action/Plan:d/c home no needs or orders.   Expected Discharge Date:                 Expected Discharge Plan:  Home/Self Care  In-House Referral:     Discharge planning Services  CM Consult  Post Acute Care Choice:    Choice offered to:     DME Arranged:    DME Agency:     HH Arranged:    [place] Agency:     Status of Service:  Completed, signed off  Medicare Important Message Given:  Yes-second notification given Date Medicare IM Given:    Medicare IM give by:    Date Additional Medicare IM Given:    Additional Medicare Important Message give by:     If discussed at [place] of Stay Meetings, dates discussed:    Additional Comments:  Dessa Phi, RN 04/05/2015, 2:05 PM

## 2015-04-05 NOTE — Progress Notes (Signed)
Patient Name: [name] Date of Encounter: 04/05/2015  Primary Cardiologist: Dr. Mare Ferrari   Principal Problem:   Sepsis Active Problems:   Hypotension   Diabetes mellitus type 2, noninsulin dependent   UTI (lower urinary tract infection)   Atrial fibrillation with rapid ventricular response   Near syncope    SUBJECTIVE  Denies any SOB. Feeling very well this morning. Began to urinating ok as well.   CURRENT MEDS . cephALEXin  500 mg Oral 3 times per day  . furosemide  40 mg Oral Daily  . insulin aspart  0-5 Units Subcutaneous QHS  . insulin aspart  0-9 Units Subcutaneous TID WC  . metoprolol succinate  37.5 mg Oral BID  . rivaroxaban  20 mg Oral Daily  . sodium chloride  3 mL Intravenous Q12H  . tamsulosin  0.4 mg Oral QPC breakfast    OBJECTIVE  Filed Vitals:   04/04/15 2134 04/05/15 0128 04/05/15 0544 04/05/15 0932  BP: 126/88 113/62 123/86 132/77  Pulse: 99 98 93 97  Temp: 98.1 F (36.7 C) 98.3 F (36.8 C) 98.2 F (36.8 C)   TempSrc: Oral Oral Oral   Resp: 20 20 20    Height:      Weight:   180 lb 11.2 oz (81.965 kg)   SpO2: 94% 95% 96%     Intake/Output Summary (Last 24 hours) at 04/05/15 1104 Last data filed at 04/05/15 0700  Gross per 24 hour  Intake    960 ml  Output   1100 ml  Net   -140 ml   Filed Weights   04/03/15 1000 04/04/15 0529 04/05/15 0544  Weight: 184 lb 1.4 oz (83.5 kg) 181 lb 7 oz (82.3 kg) 180 lb 11.2 oz (81.965 kg)    PHYSICAL EXAM  General: Pleasant, NAD. Neuro: Alert and oriented X 3. Moves all extremities spontaneously. Psych: Normal affect. HEENT:  Normal  Neck: Supple without bruits. Lungs:  Resp regular and unlabored. CTA, mild decreased breath sound in L base, but no rale Heart: irregular. no s3, s4, or murmurs. Abdomen: Soft, non-tender, non-distended, BS + x 4.  Extremities: No clubbing, cyanosis. DP/PT/Radials 2+ and equal bilaterally. Only 1+ pitting edema on RLE, LLE no edema  Accessory Clinical  Findings  CBC  Recent Labs  04/04/15 0605 04/05/15 0552  WBC 7.5 7.6  HGB 10.4* 11.1*  HCT 33.8* 35.2*  MCV 89.4 89.3  PLT 331 962   Basic Metabolic Panel  Recent Labs  04/04/15 0605 04/05/15 0552  NA 142 140  K 3.5 3.6  CL 108 108  CO2 24 26  GLUCOSE 171* 170*  BUN 26* 27*  CREATININE 0.51 0.56  CALCIUM 9.0 9.1    TELE Atrial fibrillation with high 90s-100    ECG  No new EKG  Echocardiogram 03/31/2015  LV EF: 55% -  60%  ------------------------------------------------------------------- Indications:   Atrial fibrillation - chronic 427.31.  ------------------------------------------------------------------- History:  PMH:  Hypertrophic cardiomyopathy. Mitral valve prolapse. Risk factors: Diabetes mellitus.  ------------------------------------------------------------------- Study Conclusions  - Left ventricle: The cavity size was normal. Systolic function was normal. The estimated ejection fraction was in the range of 55% to 60%. Wall motion was normal; there were no regional wall motion abnormalities. The study was not technically sufficient to allow evaluation of LV diastolic dysfunction due to atrial fibrillation. Doppler parameters are consistent with both elevated ventricular end-diastolic filling pressure and elevated left atrial filling pressure. However, given elevated LVEDP and LA pressures with severe left atrial enlargement,  diastolic dysfunction is likely. Mild concentric and severe focal basal septal hypertrophy. - Mitral valve: Moderately calcified annulus. Mildly thickened leaflets . There was moderate regurgitation. - Left atrium: The atrium was severely dilated. - Right ventricle: Systolic function was moderately reduced. - Right atrium: The atrium was mildly to moderately dilated. - Tricuspid valve: There was moderate-severe regurgitation. - Pulmonic valve: There was mild regurgitation. - Pulmonary  arteries: PASP 40 mmHg. Systolic pressure was mildly increased.    Radiology/Studies  Dg Chest Port 1 View  03/31/2015   CLINICAL DATA:  Weakness and shortness of breath. Near syncope. Bilateral lower extremity swelling.  EXAM: PORTABLE CHEST - 1 VIEW  COMPARISON:  01/01/2013  FINDINGS: Cardiomegaly is again seen. There is progressive interstitial and perihilar opacities concerning for pulmonary edema. Mild atelectasis at the right lung base. No confluent airspace disease. Questionable blunting of left costophrenic angle. No pneumothorax.  IMPRESSION: Findings suspicious for CHF.   Electronically Signed   By: Jeb Levering M.D.   On: 03/31/2015 01:21    ASSESSMENT AND PLAN  69 yo female with PMH of atrial fibrillation admitted for urosepsis and near syncope, found to be in rapid afib and also noted to have slightly elevated trop.   1. Paroxysmal atrial fibrillation with RVR  - CHA2DS2-Vasc score 3 (female, age, DM)  - Echo 03/31/2015 EF 55-60%, no RWMA, severe LA enlargement, mild concentric and severe focal basal septal hypertrophy   - TSH mildly elevated. Toprol XL increased to 37.5mg  yesterday. Followup arranged, may be able to uptitrate further later.    2. Demand ischemia: outpatient myoview  3. Acute on chronic diastolic HF: given IV diuresis  - 180 lbs is her new baseline dry weight. Urinary retention resolved. Discharge on current lasix dose. One week BMET and 2-4 weeks cardiology followup arranged.   4. Urosepsis: per IM  5. DM 6. Severe focal basal septal hypertrophy 7. Moderate MR 8. Moderate to severe TR  Ruben Im Pager: [phone] Patient is better today. Less dyspneic. Rate controlled. Okay for discharge today from cardiac standpoint. Agree with above assessment.

## 2015-04-05 NOTE — Discharge Summary (Signed)
Physician Discharge Summary  [name] MRN: [id number] DOB/AGE: [birth year] 69 y.o.  PCP: Lujean Amel, MD   Admit date: 03/30/2015 Discharge date: 04/05/2015  Discharge Diagnoses:   Principal Problem:   Sepsis Active Problems:   Hypotension   Diabetes mellitus type 2, noninsulin dependent   UTI (lower urinary tract infection)   Atrial fibrillation with rapid ventricular response   Near syncope    Follow-up recommendations Follow-up with PCP in 3-5 days , including all  additional recommended appointments as below Follow-up CBC, CMP in 3-5 days      Medication List    STOP taking these medications        amiodarone 200 MG tablet  Commonly known as:  PACERONE      TAKE these medications        AZO TABS PO  Take 1 tablet by mouth daily.     cephALEXin 500 MG capsule  Commonly known as:  KEFLEX  Take 1 capsule (500 mg total) by mouth every 8 (eight) hours.     furosemide 40 MG tablet  Commonly known as:  LASIX  Take 1 tablet (40 mg total) by mouth daily.     metoprolol succinate 25 MG 24 hr tablet  Commonly known as:  TOPROL-XL  Take 1.5 tablets (37.5 mg total) by mouth 2 (two) times daily.     rivaroxaban 20 MG Tabs tablet  Commonly known as:  XARELTO  Take 1 tablet (20 mg total) by mouth daily.       Echocardiogram LV EF: 55% -  60%  ------------------------------------------------------------------- Indications:   Atrial fibrillation - chronic 427.31.  ------------------------------------------------------------------- History:  PMH:  Hypertrophic cardiomyopathy. Mitral valve prolapse. Risk factors: Diabetes mellitus.  ------------------------------------------------------------------- Study Conclusions  - Left ventricle: The cavity size was normal. Systolic function was normal. The estimated ejection fraction was in the range of 55% to 60%. Wall motion was normal; there were no regional wall motion abnormalities. The  study was not technically sufficient to allow evaluation of LV diastolic dysfunction due to atrial fibrillation. Doppler parameters are consistent with both elevated ventricular end-diastolic filling pressure and elevated left atrial filling pressure. However, given elevated LVEDP and LA pressures with severe left atrial enlargement, diastolic dysfunction is likely. Mild concentric and severe focal basal septal hypertrophy. - Mitral valve: Moderately calcified annulus. Mildly thickened leaflets . There was moderate regurgitation. - Left atrium: The atrium was severely dilated. - Right ventricle: Systolic function was moderately reduced. - Right atrium: The atrium was mildly to moderately dilated. - Tricuspid valve: There was moderate-severe regurgitation. - Pulmonic valve: There was mild regurgitation. - Pulmonary arteries: PASP 40 mmHg. Systolic pressure was mildly increased.  Discharge Condition: Stable   Disposition: 03-Skilled Nursing Facility   Consults: None  Significant Diagnostic Studies:  Dg Chest Port 1 View  03/31/2015   CLINICAL DATA:  Weakness and shortness of breath. Near syncope. Bilateral lower extremity swelling.  EXAM: PORTABLE CHEST - 1 VIEW  COMPARISON:  01/01/2013  FINDINGS: Cardiomegaly is again seen. There is progressive interstitial and perihilar opacities concerning for pulmonary edema. Mild atelectasis at the right lung base. No confluent airspace disease. Questionable blunting of left costophrenic angle. No pneumothorax.  IMPRESSION: Findings suspicious for CHF.   Electronically Signed   By: Jeb Levering M.D.   On: 03/31/2015 01:21       Filed Weights   04/03/15 1000 04/04/15 0529 04/05/15 0544  Weight: 83.5 kg (184 lb 1.4 oz) 82.3 kg (181 lb 7 oz) 81.965 kg (  180 lb 11.2 oz)     Microbiology: Recent Results (from the past 240 hour(s))  Culture, blood (x 2)     Status: None   Collection Time: 03/30/15 11:44 PM  Result Value  Ref Range Status   Specimen Description BLOOD RIGHT ARM  Final   Special Requests BOTTLES DRAWN AEROBIC AND ANAEROBIC 5ML  Final   Culture   Final    NO GROWTH 5 DAYS Performed at [facility]    Report Status 04/05/2015 FINAL  Final  Urine culture     Status: None   Collection Time: 03/31/15 12:16 AM  Result Value Ref Range Status   Specimen Description URINE, CLEAN CATCH  Final   Special Requests NONE  Final   Culture   Final    >=100,000 COLONIES/mL KLEBSIELLA PNEUMONIAE Performed at [facility]    Report Status 04/03/2015 FINAL  Final   Organism ID, Bacteria KLEBSIELLA PNEUMONIAE  Final      Susceptibility   Klebsiella pneumoniae - MIC*    AMPICILLIN >=32 RESISTANT Resistant     CEFAZOLIN <=4 SENSITIVE Sensitive     CEFTRIAXONE <=1 SENSITIVE Sensitive     CIPROFLOXACIN <=0.25 SENSITIVE Sensitive     GENTAMICIN <=1 SENSITIVE Sensitive     IMIPENEM <=0.25 SENSITIVE Sensitive     NITROFURANTOIN 64 INTERMEDIATE Intermediate     TRIMETH/SULFA <=20 SENSITIVE Sensitive     AMPICILLIN/SULBACTAM 4 SENSITIVE Sensitive     PIP/TAZO <=4 SENSITIVE Sensitive     * >=100,000 COLONIES/mL KLEBSIELLA PNEUMONIAE  Culture, blood (x 2)     Status: None   Collection Time: 03/31/15  3:10 AM  Result Value Ref Range Status   Specimen Description BLOOD LEFT FOREARM  Final   Special Requests BOTTLES DRAWN AEROBIC AND ANAEROBIC 5ML  Final   Culture   Final    NO GROWTH 5 DAYS Performed at [facility]    Report Status 04/05/2015 FINAL  Final  MRSA PCR Screening     Status: None   Collection Time: 03/31/15  8:40 AM  Result Value Ref Range Status   MRSA by PCR NEGATIVE NEGATIVE Final    Comment:        The GeneXpert MRSA Assay (FDA approved for NASAL specimens only), is one component of a comprehensive MRSA colonization surveillance program. It is not intended to diagnose MRSA infection nor to guide or monitor treatment for MRSA infections.        Blood  Culture    Component Value Date/Time   SDES BLOOD LEFT FOREARM 03/31/2015 0310   SPECREQUEST BOTTLES DRAWN AEROBIC AND ANAEROBIC 5ML 03/31/2015 0310   CULT  03/31/2015 0310    NO GROWTH 5 DAYS Performed at [place] 04/05/2015 FINAL 03/31/2015 0310      Labs: Results for orders placed or performed during the hospital encounter of 03/30/15 (from the past 48 hour(s))  Glucose, capillary     Status: Abnormal   Collection Time: 04/03/15  5:32 PM  Result Value Ref Range   Glucose-Capillary 182 (H) 65 - 99 mg/dL  Glucose, capillary     Status: Abnormal   Collection Time: 04/03/15  9:53 PM  Result Value Ref Range   Glucose-Capillary 191 (H) 65 - 99 mg/dL  CBC     Status: Abnormal   Collection Time: 04/04/15  6:05 AM  Result Value Ref Range   WBC 7.5 4.0 - 10.5 K/uL   RBC 3.78 (L) 3.87 - 5.11 MIL/uL  Hemoglobin 10.4 (L) 12.0 - 15.0 g/dL   HCT 33.8 (L) 36.0 - 46.0 %   MCV 89.4 78.0 - 100.0 fL   MCH 27.5 26.0 - 34.0 pg   MCHC 30.8 30.0 - 36.0 g/dL   RDW 16.5 (H) 11.5 - 15.5 %   Platelets 331 150 - 400 K/uL  Basic metabolic panel     Status: Abnormal   Collection Time: 04/04/15  6:05 AM  Result Value Ref Range   Sodium 142 135 - 145 mmol/L   Potassium 3.5 3.5 - 5.1 mmol/L   Chloride 108 101 - 111 mmol/L   CO2 24 22 - 32 mmol/L   Glucose, Bld 171 (H) 65 - 99 mg/dL   BUN 26 (H) 6 - 20 mg/dL   Creatinine, Ser 0.51 0.44 - 1.00 mg/dL   Calcium 9.0 8.9 - 10.3 mg/dL   GFR calc non Af Amer >60 >60 mL/min   GFR calc Af Amer >60 >60 mL/min    Comment: (NOTE) The eGFR has been calculated using the CKD EPI equation. This calculation has not been validated in all clinical situations. eGFR's persistently <60 mL/min signify possible Chronic Kidney Disease.    Anion gap 10 5 - 15  Glucose, capillary     Status: Abnormal   Collection Time: 04/04/15  7:38 AM  Result Value Ref Range   Glucose-Capillary 147 (H) 65 - 99 mg/dL  Glucose, capillary     Status:  Abnormal   Collection Time: 04/04/15 11:30 AM  Result Value Ref Range   Glucose-Capillary 148 (H) 65 - 99 mg/dL  Glucose, capillary     Status: Abnormal   Collection Time: 04/04/15  4:31 PM  Result Value Ref Range   Glucose-Capillary 160 (H) 65 - 99 mg/dL  Glucose, capillary     Status: Abnormal   Collection Time: 04/04/15  9:29 PM  Result Value Ref Range   Glucose-Capillary 150 (H) 65 - 99 mg/dL  CBC     Status: Abnormal   Collection Time: 04/05/15  5:52 AM  Result Value Ref Range   WBC 7.6 4.0 - 10.5 K/uL   RBC 3.94 3.87 - 5.11 MIL/uL   Hemoglobin 11.1 (L) 12.0 - 15.0 g/dL   HCT 35.2 (L) 36.0 - 46.0 %   MCV 89.3 78.0 - 100.0 fL   MCH 28.2 26.0 - 34.0 pg   MCHC 31.5 30.0 - 36.0 g/dL   RDW 16.2 (H) 11.5 - 15.5 %   Platelets 360 150 - 400 K/uL  Basic metabolic panel     Status: Abnormal   Collection Time: 04/05/15  5:52 AM  Result Value Ref Range   Sodium 140 135 - 145 mmol/L   Potassium 3.6 3.5 - 5.1 mmol/L   Chloride 108 101 - 111 mmol/L   CO2 26 22 - 32 mmol/L   Glucose, Bld 170 (H) 65 - 99 mg/dL   BUN 27 (H) 6 - 20 mg/dL   Creatinine, Ser 0.56 0.44 - 1.00 mg/dL   Calcium 9.1 8.9 - 10.3 mg/dL   GFR calc non Af Amer >60 >60 mL/min   GFR calc Af Amer >60 >60 mL/min    Comment: (NOTE) The eGFR has been calculated using the CKD EPI equation. This calculation has not been validated in all clinical situations. eGFR's persistently <60 mL/min signify possible Chronic Kidney Disease.    Anion gap 6 5 - 15  Glucose, capillary     Status: Abnormal   Collection Time: 04/05/15  7:23 AM  Result Value Ref Range   Glucose-Capillary 177 (H) 65 - 99 mg/dL  Glucose, capillary     Status: Abnormal   Collection Time: 04/05/15 12:06 PM  Result Value Ref Range   Glucose-Capillary 150 (H) 65 - 99 mg/dL     Lipid Panel     Component Value Date/Time   CHOL 133 12/31/2012 2300   TRIG 137 12/31/2012 2300   HDL 8* 12/31/2012 2300   CHOLHDL 16.6 12/31/2012 2300   VLDL 27 12/31/2012  2300   LDLCALC 98 12/31/2012 2300     Lab Results  Component Value Date   HGBA1C 7.8* 03/31/2015   HGBA1C 10.6* 12/31/2012     Lab Results  Component Value Date   LDLCALC 98 12/31/2012   CREATININE 0.56 04/05/2015     HPI :The patient is a 69 yr old woman (retired psychiatric nurse at [employer]) with a PMH significant for atrial fibrillation dating back to at least June 2014 with mild apical hypertrophic cardiomyopathy found at that time. This occurred in the context of sepsis. She also has diabetes.  She was admitted yesterday with urosepsis and near syncope (hypotensive, hypothermic) and found to be in rapid atrial fibrillation and started on IV antimicrobial therapy and IV fluids. She had been on amiodarone and Xarelto in 2014 but stopped taking it on her own.   Echocardiogram yesterday showed normal LV systolic function, LVEF 61-60%, probable diastolic dysfunction, elevated LVEDP and left atrial pressures, mild concentric and severe focal basal septal hypertrophy, severe left atrial enlargement, mild to moderate RA enlargement, moderate mitral regurgitation, moderately reduced RV systolic function, and moderate to severe tricuspid regurgitation with mildly elevated pulmonary pressures (40 mmHg).  No mitral valve prolapse seen.  ECG showed atrial fibrillation, nonspecific IVCD, and possible old anteroseptal infarct.  Troponins 0.05-->0.07-->0.17-->0.19-->0.24.  HOSPITAL COURSE:   A fib with RVR CHA2DS2-Vasc score 3 (female, age, DM) - Echo 03/31/2015 EF 55-60%, no RWMA, severe LA enlargement, mild concentric and severe focal basal septal hypertrophy  - TSH mildly elevated. Currently on Toprol Xl $RemoveB'25mg'aSyXIbIo$  BID and Xarelto with HR high 90s. SBP 100-130s. BP now 120-140s, consider increase Toprol XL to 37.5 BID.     Troponin elevation  - likely demand given infection and A fib with RVR, overall flat  - cardiology following, patient will need outpatient  Myoview  UTI - She was initially started on ceftriaxone, urine culture speciated Klebsiella on 8/30, narrow her antibiotics to Keflex based on sensitivities, will continue for another 5 days  Acute on chronic diastolic CHF - repeat 2D echo as above - she is getting diuresed per cardiology, she is approaching baseline volume status - 180 lbs is her new baseline dry weight. Urinary retention resolved. Discharge on current lasix dose. One week BMET and 2-4 weeks cardiology followup arranged.   DM controlled - patient insists on regular diet - continue SSI - A1C 7.8 showing poor control  Medication non compliance - she was supposed to be on Amiodarone and Xarelto, self discontinued 2 years ago and has not taken any medications or seen any MD since  Discharge Exam:Blood pressure 132/77, pulse 97, temperature 98.2 F (36.8 C), temperature source Oral, resp. rate 20, height $RemoveBe'5\' 4"'oIQMLcgrr$  (1.626 m), weight 81.965 kg (180 lb 11.2 oz), SpO2 96 %.    GENERAL: NAD  HEENT: head NCAT, no scleral icterus. Pupils round and reactive.  LUNGS: Clear to auscultation. No wheezing or crackles  HEART: irregular, without murmurs. 2+ pulses, no JVD, no peripheral edema  ABDOMEN: Soft, nontender, and nondistended. Positive bowel sounds. No hepatosplenomegaly was noted.  EXTREMITIES: Without any cyanosis, clubbing, rash, lesions or edema.  NEUROLOGIC: Alert and oriented x3. Strength 5/5 in all 4.  PSYCHIATRIC: Normal mood and affect      Discharge Instructions    Diet - low sodium heart healthy    Complete by:  As directed      Increase activity slowly    Complete by:  As directed            Follow-up Information    Follow up with Melina Copa, PA-C On 04/26/2015.   Specialties:  Cardiology, Radiology   Why:  10:15am   Contact information:   [address] [place] [address] [phone]       Follow up with [facility] On 04/12/2015.   Specialty:   Cardiology   Why:  Obtain Basic metabolic panel during normal lab hour between 7:30 am until 4:30pm given new lasix dose to monitor renal function and potassium   Contact information:   [address] [place] [phone]      Follow up with Lujean Amel, MD. Schedule an appointment as soon as possible for a visit in 3 days.   Specialty:  Family Medicine   Contact information:   [place] [address] [phone]       Signed: Reyne Dumas 04/05/2015, 1:53 PM        Time spent >45 mins

## 2015-04-12 ENCOUNTER — Telehealth: Payer: Self-pay | Admitting: Cardiology

## 2015-04-12 ENCOUNTER — Other Ambulatory Visit (INDEPENDENT_AMBULATORY_CARE_PROVIDER_SITE_OTHER): Payer: Medicare Other | Admitting: *Deleted

## 2015-04-12 DIAGNOSIS — I5033 Acute on chronic diastolic (congestive) heart failure: Secondary | ICD-10-CM

## 2015-04-12 LAB — BASIC METABOLIC PANEL
BUN: 25 mg/dL — ABNORMAL HIGH (ref 6–23)
CALCIUM: 9.5 mg/dL (ref 8.4–10.5)
CO2: 29 meq/L (ref 19–32)
Chloride: 104 mEq/L (ref 96–112)
Creatinine, Ser: 0.71 mg/dL (ref 0.40–1.20)
GFR: 87.19 mL/min (ref >60.00)
GLUCOSE: 161 mg/dL — AB (ref 70–99)
Potassium: 4.3 mEq/L (ref 3.5–5.1)
Sodium: 143 mEq/L (ref 135–145)

## 2015-04-12 NOTE — Telephone Encounter (Signed)
Patient calling complaining about SOB and weakness that has been going on since she was in the hospital. Patient's dx was sepsis while in hospital. Patient stated that her BLE are swollen, her heart beat is irregular, she can't sleep at night, and she has dizziness after taking her Metoprolol. Patient is unable to give me any BP or HR readings. Patient does not have a scale, so weight is not accessible.  Patient was discharged on 04/05/2015 with instructions to follow up with her PCP ASAP. Patient has not called her PCP or made an appointment with them. Informed patient that I will consult FLEX or DOD and for her to call her PCP in the mean time.  DOD, Dr. Marlou Porch agrees with going to PCP first due to patient's history. Left message for patient to call back.

## 2015-04-12 NOTE — Telephone Encounter (Signed)
Pt c/o Shortness Of Breath: STAT if SOB developed within the last 24 hours or pt is noticeably SOB on the phone  1. Are you currently SOB (can you hear that pt is SOB on the phone)? Yes  2. How long have you been experiencing SOB? For a week  3. Are you SOB when sitting or when up moving around? Both, more at night  4. Are you currently experiencing any other symptoms? Swelling in feet and legs

## 2015-04-12 NOTE — Telephone Encounter (Signed)
Patient called back. Informed her of DOD recommendation to follow-up with her PCP. Patient stated she would call her PCP, but she did not think she needed to see her PCP.

## 2015-04-13 ENCOUNTER — Emergency Department (HOSPITAL_COMMUNITY): Payer: Medicare Other

## 2015-04-13 ENCOUNTER — Inpatient Hospital Stay (HOSPITAL_COMMUNITY)
Admission: EM | Admit: 2015-04-13 | Discharge: 2015-04-23 | DRG: 224 | Disposition: A | Discharge status: Home / Self Care | Payer: Medicare Other | Attending: Internal Medicine | Admitting: Internal Medicine

## 2015-04-13 ENCOUNTER — Encounter (HOSPITAL_COMMUNITY): Payer: Self-pay | Admitting: *Deleted

## 2015-04-13 DIAGNOSIS — I480 Paroxysmal atrial fibrillation: Secondary | ICD-10-CM | POA: Diagnosis present

## 2015-04-13 DIAGNOSIS — I422 Other hypertrophic cardiomyopathy: Secondary | ICD-10-CM | POA: Diagnosis not present

## 2015-04-13 DIAGNOSIS — J9600 Acute respiratory failure, unspecified whether with hypoxia or hypercapnia: Secondary | ICD-10-CM | POA: Diagnosis not present

## 2015-04-13 DIAGNOSIS — I472 Ventricular tachycardia, unspecified: Secondary | ICD-10-CM

## 2015-04-13 DIAGNOSIS — I4892 Unspecified atrial flutter: Secondary | ICD-10-CM | POA: Diagnosis present

## 2015-04-13 DIAGNOSIS — E119 Type 2 diabetes mellitus without complications: Secondary | ICD-10-CM

## 2015-04-13 DIAGNOSIS — Z79899 Other long term (current) drug therapy: Secondary | ICD-10-CM

## 2015-04-13 DIAGNOSIS — I5033 Acute on chronic diastolic (congestive) heart failure: Secondary | ICD-10-CM | POA: Insufficient documentation

## 2015-04-13 DIAGNOSIS — I481 Persistent atrial fibrillation: Secondary | ICD-10-CM | POA: Diagnosis not present

## 2015-04-13 DIAGNOSIS — R0602 Shortness of breath: Secondary | ICD-10-CM

## 2015-04-13 DIAGNOSIS — J9601 Acute respiratory failure with hypoxia: Secondary | ICD-10-CM | POA: Diagnosis present

## 2015-04-13 DIAGNOSIS — Z23 Encounter for immunization: Secondary | ICD-10-CM

## 2015-04-13 DIAGNOSIS — I5021 Acute systolic (congestive) heart failure: Secondary | ICD-10-CM | POA: Diagnosis present

## 2015-04-13 DIAGNOSIS — R002 Palpitations: Secondary | ICD-10-CM | POA: Diagnosis not present

## 2015-04-13 DIAGNOSIS — R079 Chest pain, unspecified: Secondary | ICD-10-CM | POA: Diagnosis not present

## 2015-04-13 DIAGNOSIS — I4891 Unspecified atrial fibrillation: Secondary | ICD-10-CM

## 2015-04-13 DIAGNOSIS — Z7901 Long term (current) use of anticoagulants: Secondary | ICD-10-CM

## 2015-04-13 DIAGNOSIS — I421 Obstructive hypertrophic cardiomyopathy: Secondary | ICD-10-CM

## 2015-04-13 DIAGNOSIS — D6489 Other specified anemias: Secondary | ICD-10-CM | POA: Diagnosis present

## 2015-04-13 DIAGNOSIS — I341 Nonrheumatic mitral (valve) prolapse: Secondary | ICD-10-CM | POA: Diagnosis present

## 2015-04-13 DIAGNOSIS — I4510 Unspecified right bundle-branch block: Secondary | ICD-10-CM | POA: Diagnosis present

## 2015-04-13 DIAGNOSIS — N189 Chronic kidney disease, unspecified: Secondary | ICD-10-CM | POA: Diagnosis present

## 2015-04-13 DIAGNOSIS — Z87891 Personal history of nicotine dependence: Secondary | ICD-10-CM

## 2015-04-13 DIAGNOSIS — I2510 Atherosclerotic heart disease of native coronary artery without angina pectoris: Secondary | ICD-10-CM

## 2015-04-13 DIAGNOSIS — I270 Primary pulmonary hypertension: Secondary | ICD-10-CM | POA: Diagnosis not present

## 2015-04-13 DIAGNOSIS — I509 Heart failure, unspecified: Secondary | ICD-10-CM | POA: Diagnosis not present

## 2015-04-13 DIAGNOSIS — E876 Hypokalemia: Secondary | ICD-10-CM | POA: Diagnosis not present

## 2015-04-13 DIAGNOSIS — Z95810 Presence of automatic (implantable) cardiac defibrillator: Secondary | ICD-10-CM

## 2015-04-13 DIAGNOSIS — J9811 Atelectasis: Secondary | ICD-10-CM | POA: Diagnosis not present

## 2015-04-13 DIAGNOSIS — J9690 Respiratory failure, unspecified, unspecified whether with hypoxia or hypercapnia: Secondary | ICD-10-CM | POA: Diagnosis not present

## 2015-04-13 DIAGNOSIS — I25110 Atherosclerotic heart disease of native coronary artery with unstable angina pectoris: Secondary | ICD-10-CM | POA: Diagnosis not present

## 2015-04-13 DIAGNOSIS — I499 Cardiac arrhythmia, unspecified: Secondary | ICD-10-CM | POA: Diagnosis not present

## 2015-04-13 HISTORY — DX: Atherosclerotic heart disease of native coronary artery without angina pectoris: I25.10

## 2015-04-13 HISTORY — DX: Other hypertrophic cardiomyopathy: I42.2

## 2015-04-13 HISTORY — DX: Ventricular tachycardia: I47.2

## 2015-04-13 HISTORY — DX: Rheumatic tricuspid insufficiency: I07.1

## 2015-04-13 HISTORY — DX: Ventricular tachycardia, unspecified: I47.20

## 2015-04-13 HISTORY — DX: Cardiomegaly: I51.7

## 2015-04-13 LAB — URINALYSIS, ROUTINE W REFLEX MICROSCOPIC
Glucose, UA: NEGATIVE mg/dL
HGB URINE DIPSTICK: NEGATIVE
KETONES UR: NEGATIVE mg/dL
Leukocytes, UA: NEGATIVE
NITRITE: NEGATIVE
PH: 5 (ref 5.0–8.0)
Protein, ur: 100 mg/dL — AB
Specific Gravity, Urine: 1.02 (ref 1.005–1.030)
UROBILINOGEN UA: 1 mg/dL (ref 0.0–1.0)

## 2015-04-13 LAB — I-STAT TROPONIN, ED: TROPONIN I, POC: 0.02 ng/mL (ref 0.00–0.08)

## 2015-04-13 LAB — CBC WITH DIFFERENTIAL/PLATELET
Basophils Absolute: 0.1 10*3/uL (ref 0.0–0.1)
Basophils Relative: 1 % (ref 0–1)
EOS PCT: 4 % (ref 0–5)
Eosinophils Absolute: 0.3 10*3/uL (ref 0.0–0.7)
HCT: 39.2 % (ref 36.0–46.0)
Hemoglobin: 12.1 g/dL (ref 12.0–15.0)
LYMPHS ABS: 1.5 10*3/uL (ref 0.7–4.0)
LYMPHS PCT: 19 % (ref 12–46)
MCH: 27.3 pg (ref 26.0–34.0)
MCHC: 30.9 g/dL (ref 30.0–36.0)
MCV: 88.5 fL (ref 78.0–100.0)
MONO ABS: 0.5 10*3/uL (ref 0.1–1.0)
MONOS PCT: 6 % (ref 3–12)
Neutro Abs: 5.8 10*3/uL (ref 1.7–7.7)
Neutrophils Relative %: 70 % (ref 43–77)
PLATELETS: 402 10*3/uL — AB (ref 150–400)
RBC: 4.43 MIL/uL (ref 3.87–5.11)
RDW: 16.2 % — AB (ref 11.5–15.5)
WBC: 8.2 10*3/uL (ref 4.0–10.5)

## 2015-04-13 LAB — COMPREHENSIVE METABOLIC PANEL
ALT: 24 U/L (ref 14–54)
AST: 25 U/L (ref 15–41)
Albumin: 3.9 g/dL (ref 3.5–5.0)
Alkaline Phosphatase: 111 U/L (ref 38–126)
Anion gap: 11 (ref 5–15)
BILIRUBIN TOTAL: 1.7 mg/dL — AB (ref 0.3–1.2)
BUN: 22 mg/dL — ABNORMAL HIGH (ref 6–20)
CHLORIDE: 103 mmol/L (ref 101–111)
CO2: 25 mmol/L (ref 22–32)
Calcium: 8.9 mg/dL (ref 8.9–10.3)
Creatinine, Ser: 0.75 mg/dL (ref 0.44–1.00)
Glucose, Bld: 164 mg/dL — ABNORMAL HIGH (ref 65–99)
POTASSIUM: 3.5 mmol/L (ref 3.5–5.1)
Sodium: 139 mmol/L (ref 135–145)
TOTAL PROTEIN: 6.8 g/dL (ref 6.5–8.1)

## 2015-04-13 LAB — I-STAT CHEM 8, ED
BUN: 27 mg/dL — ABNORMAL HIGH (ref 6–20)
CALCIUM ION: 1.1 mmol/L — AB (ref 1.13–1.30)
CHLORIDE: 102 mmol/L (ref 101–111)
CREATININE: 0.6 mg/dL (ref 0.44–1.00)
GLUCOSE: 167 mg/dL — AB (ref 65–99)
HCT: 41 % (ref 36.0–46.0)
Hemoglobin: 13.9 g/dL (ref 12.0–15.0)
Potassium: 3.5 mmol/L (ref 3.5–5.1)
Sodium: 142 mmol/L (ref 135–145)
TCO2: 25 mmol/L (ref 0–100)

## 2015-04-13 LAB — BASIC METABOLIC PANEL
Anion gap: 6 (ref 5–15)
BUN: 23 mg/dL — AB (ref 6–20)
CHLORIDE: 105 mmol/L (ref 101–111)
CO2: 29 mmol/L (ref 22–32)
CREATININE: 0.84 mg/dL (ref 0.44–1.00)
Calcium: 8.6 mg/dL — ABNORMAL LOW (ref 8.9–10.3)
Glucose, Bld: 187 mg/dL — ABNORMAL HIGH (ref 65–99)
POTASSIUM: 3.6 mmol/L (ref 3.5–5.1)
SODIUM: 140 mmol/L (ref 135–145)

## 2015-04-13 LAB — TROPONIN I
Troponin I: 0.04 ng/mL — ABNORMAL HIGH (ref <0.031)
Troponin I: 0.07 ng/mL — ABNORMAL HIGH (ref <0.031)

## 2015-04-13 LAB — APTT: APTT: 39 s — AB (ref 24–37)

## 2015-04-13 LAB — BRAIN NATRIURETIC PEPTIDE: B NATRIURETIC PEPTIDE 5: 740.3 pg/mL — AB (ref 0.0–100.0)

## 2015-04-13 LAB — I-STAT CG4 LACTIC ACID, ED: Lactic Acid, Venous: 1.47 mmol/L (ref 0.5–2.0)

## 2015-04-13 LAB — URINE MICROSCOPIC-ADD ON

## 2015-04-13 LAB — PHOSPHORUS
PHOSPHORUS: 3.8 mg/dL (ref 2.5–4.6)
Phosphorus: 4.4 mg/dL (ref 2.5–4.6)

## 2015-04-13 LAB — MAGNESIUM
MAGNESIUM: 1.9 mg/dL (ref 1.7–2.4)
Magnesium: 2.3 mg/dL (ref 1.7–2.4)

## 2015-04-13 LAB — MRSA PCR SCREENING: MRSA by PCR: NEGATIVE

## 2015-04-13 MED ORDER — AMIODARONE HCL IN DEXTROSE 360-4.14 MG/200ML-% IV SOLN
60.0000 mg/h | INTRAVENOUS | Status: AC
  Administered 2015-04-13: 60 mg/h via INTRAVENOUS

## 2015-04-13 MED ORDER — HEPARIN (PORCINE) IN NACL 100-0.45 UNIT/ML-% IJ SOLN
1000.0000 [IU]/h | INTRAMUSCULAR | Status: DC
  Filled 2015-04-13: qty 250

## 2015-04-13 MED ORDER — AMIODARONE HCL IN DEXTROSE 360-4.14 MG/200ML-% IV SOLN
30.0000 mg/h | INTRAVENOUS | Status: DC
  Administered 2015-04-13 – 2015-04-14 (×2): 30 mg/h via INTRAVENOUS
  Filled 2015-04-13 (×2): qty 200

## 2015-04-13 MED ORDER — SODIUM CHLORIDE 0.9 % IV SOLN: 250.0000 mL | INTRAVENOUS | Status: DC | PRN

## 2015-04-13 MED ORDER — MAGNESIUM SULFATE 2 GM/50ML IV SOLN
2.0000 g | Freq: Once | INTRAVENOUS | Status: AC
  Administered 2015-04-13: 2 g via INTRAVENOUS
  Filled 2015-04-13: qty 50

## 2015-04-13 MED ORDER — ONDANSETRON HCL 4 MG/2ML IJ SOLN
INTRAMUSCULAR | Status: AC
  Administered 2015-04-13: 4 mg
  Filled 2015-04-13: qty 2

## 2015-04-13 MED ORDER — SODIUM CHLORIDE 0.9 % IV SOLN
1.0000 g | INTRAVENOUS | Status: AC
  Administered 2015-04-13: 1 g via INTRAVENOUS
  Filled 2015-04-13: qty 10

## 2015-04-13 MED ORDER — AMIODARONE HCL IN DEXTROSE 360-4.14 MG/200ML-% IV SOLN
60.0000 mg/h | INTRAVENOUS | Status: AC
  Administered 2015-04-13: 60 mg/h via INTRAVENOUS
  Filled 2015-04-13 (×2): qty 200

## 2015-04-13 MED ORDER — CARVEDILOL 12.5 MG PO TABS
12.5000 mg | ORAL_TABLET | Freq: Two times a day (BID) | ORAL | Status: DC
  Administered 2015-04-13 – 2015-04-14 (×2): 12.5 mg via ORAL
  Filled 2015-04-13 (×3): qty 1

## 2015-04-13 MED ORDER — FUROSEMIDE 10 MG/ML IJ SOLN
40.0000 mg | Freq: Two times a day (BID) | INTRAMUSCULAR | Status: DC
  Administered 2015-04-13 – 2015-04-15 (×4): 40 mg via INTRAVENOUS
  Filled 2015-04-13 (×4): qty 4

## 2015-04-13 MED ORDER — LIDOCAINE IN D5W 4-5 MG/ML-% IV SOLN
1.0000 mg/min | INTRAVENOUS | Status: DC
  Administered 2015-04-13: 1 mg/min via INTRAVENOUS
  Filled 2015-04-13: qty 500

## 2015-04-13 MED ORDER — RIVAROXABAN 20 MG PO TABS: 20.0000 mg | ORAL_TABLET | Freq: Every day | ORAL | Status: DC

## 2015-04-13 MED ORDER — AMIODARONE IV BOLUS ONLY 150 MG/100ML
150.0000 mg | Freq: Once | INTRAVENOUS | Status: AC
  Administered 2015-04-13: 150 mg via INTRAVENOUS
  Filled 2015-04-13: qty 100

## 2015-04-13 MED ORDER — RIVAROXABAN 20 MG PO TABS
20.0000 mg | ORAL_TABLET | Freq: Every day | ORAL | Status: DC
  Administered 2015-04-13 – 2015-04-17 (×5): 20 mg via ORAL
  Filled 2015-04-13 (×5): qty 1

## 2015-04-13 MED ORDER — LIDOCAINE IN D5W 4-5 MG/ML-% IV SOLN: 1.0000 mg/min | INTRAVENOUS | Status: DC

## 2015-04-13 NOTE — Consult Note (Addendum)
PHARMACY  NOTE  Pharmacy Consult :  Heparin Indication : atrial fibrillation/flutter on chronic Xarelto  Allergies: No Known Allergies  Heparin Dosing weight : 72.5 kg  Vital Signs: BP 148/77 mmHg  Pulse 78  Temp(Src) 97.8 F (36.6 C) (Oral)  Resp 20  Wt 180 lb (81.647 kg)  SpO2 94%  Active Problems: Active Problems:   Ventricular tachycardia  Basic Metabolic Panel:  Recent Labs Lab 04/12/15 1403 04/13/15 1355 04/13/15 1419  NA 143 139 142  K 4.3 3.5 3.5  CL 104 103 102  CO2 29 25  --   GLUCOSE 161* 164* 167*  BUN 25* 22* 27*  CREATININE 0.71 0.75 0.60  CALCIUM 9.5 8.9  --   MG  --  1.9  --   PHOS  --  3.8  --    Cardiac Enzymes: No results for input(s): CKTOTAL, CKMB, CKMBINDEX, TROPONINI in the last 168 hours.)  Recent Labs  03/30/15 2344 04/13/15 1355  BNP 630.5* 740.3*   Estimated Creatinine Clearance: 70.6 mL/min (by C-G formula based on Cr of 0.6).  Medical / Surgical History: Past Medical History  Diagnosis Date  . Diabetes mellitus without complication     dx on wed  . Mitral valve prolapse   . Atrial flutter   . Atrial fibrillation   . Hypertrophic cardiomyopathy   . Left atrial enlargement   . Tricuspid regurgitation   . Ventricular tachycardia    Past Surgical History  Procedure Laterality Date  . Dental surgery  Oct. 2011    several extractions, bone graft  . I&d extremity Left 01/06/2013    Procedure: IRRIGATION AND DEBRIDEMENT LEFT ELBOW AND LEFT FOREARM ;  Surgeon: [name], MD;  Location: [facility];  Service: Orthopedics;  Laterality: Left;  . Application of wound vac Left 01/06/2013    Procedure: APPLICATION OF WOUND VAC X 2;  Surgeon: [name], MD;  Location: [facility];  Service: Orthopedics;  Laterality: Left;  left forearm  . Incision and drainage Left 01/09/2013    Procedure: REDO INCISION AND DRAINAGE LEFT ELBOW;  Surgeon: [name], MD;  Location: [facility];  Service: Orthopedics;  Laterality: Left;  SUPINE,  UPPER EXTERMITY DRAPE  . Secondary closure of wound Left 01/09/2013    Procedure: SECONDARY CLOSURE OF WOUND  LEFT ELBOW;  Surgeon: [name], MD;  Location: [facility];  Service: Orthopedics;  Laterality: Left;  . Application of wound vac Left 01/09/2013    Procedure: APPLICATION OF WOUND VAC;  Surgeon: [name], MD;  Location: [facility];  Service: Orthopedics;  Laterality: Left;    MEDICATION: Medication PTA: Patient reports Xarelto 20 mg qacs.  Last dose > 24 hours ago. Scheduled:  Scheduled:  . carvedilol  12.5 mg Oral BID WC  . furosemide  40 mg Intravenous Q12H  . rivaroxaban  20 mg Oral Q supper   Infusion[s]: Infusions:  . sodium chloride    . amiodarone 60 mg/hr (04/13/15 1408)  . amiodarone    . amiodarone    . lidocaine 1 mg/min (04/13/15 1519)   Antibiotic[s]: Anti-infectives    None     ASSESSMENT:  69 y.o. female is to be started on Heparin bridging for admission for new onset of recurrent runs of VT.  Xarelto will be Discontinued.    Due to potential lab interference with Heparin monitoring, Initially aPTT and Heparin Levels will be used for monitoring and adjusting Heparin therapy.  GOAL:  Heparin Level  0.3 -  0.7 units/ml  APTT 66-102 secs for Afib.     PLAN: 1. After baseline aPTT and Heparin levels are collected, begin Heparin infusion, no bolus, at 1000 units/hr.   2. Heparin level and aPTT in 6 hours.   3. Daily Heparin level, aPTT [as needed], CBC while on Heparin.  Monitor for bleeding complications. Follow Platelet counts.  [name],  Pharm.D   04/13/2015,  6:09 PM    ADDENDUM:     Dr. Rayann Heman decided to Discontinue Heparin and continue Xarelto 20 mg qacs.  Heparin and lab orders Discontinued .  [name],  Pharm.D   04/13/2015,  6:15 PM

## 2015-04-13 NOTE — ED Notes (Signed)
MD at bedside. 

## 2015-04-13 NOTE — ED Notes (Signed)
Pt presents via [facility] for c/o fatigue/Nausea/vomting.  Pt was seen by MD last week for similar sx and is currently being treated for a UTI-keflex.  When EMS arrived, pt was having frequent (q 1 min) runs of Vtach.  EMS increased O2 and runs became less frequent.  Hx: CHF, Afib.  Pt a x 4, NAD, BP-134/94 HR 120s CBG-169.  Zoll pads placed on pt, 18g to R FA.

## 2015-04-13 NOTE — ED Notes (Signed)
Left message per pt.s request on pt.s grandsons cell. Informed him that pt. Is at  .

## 2015-04-13 NOTE — Consult Note (Addendum)
ELECTROPHYSIOLOGY CONSULT NOTE    Primary Care Physician: Lujean Amel, MD  Primary Cardiologist:  Dr Wynonia Lawman Referring Physician:  DR Sandria Bales Date: 04/13/2015  Reason for consultation:VT  [name] is a 69 y.o. female with a h/o hypertrophic CM and atrial fibrillation who is admitted with incessant VT.   She reports progressive weakness, palpitations, dizziness, and SOB today.  EMS was called and she was found to have incessant salvos of monomorphic VT with CL 400 msec.  She did not have syncope but was hypotensive with episodes.  She was started on amiodarone and lidocaine with improvement in VT and symptoms.  She is admitted for further management.  She has severe LA enlargement and atrial fibrillation.  She also has diastolic dysfunction and is currently volume overloaded.  She was previously on amiodarone but stopped it herself.  She has been noncompliant with office follow-up and has not seen Dr Wynonia Lawman in several years.  She is on xarelto for stroke prevention and also for DVT therapy (per report)  Durring her recent hospitalization, 2D echo revealed normal LVF with EF of 21-22% probable diastolic dysfunction, elevated LVEDP and left atrial pressures, mild concentric and severe focal basal septal hypertrophy, severe left atrial enlargement, mild to moderate RA enlargement, moderate mitral regurgitation, moderately reduced RV systolic function, and moderate to severe tricuspid regurgitation with mildly elevated pulmonary pressures (40 mmHg). No mitral valve prolapse seen.  Today, she denies symptoms of chest pain  or neurologic sequela. The patient is tolerating medications without difficulties and is otherwise without complaint today.   Past Medical History  Diagnosis Date  . Diabetes mellitus without complication     dx on wed  . Mitral valve prolapse   . Atrial flutter   . Atrial fibrillation    Past Surgical History  Procedure Laterality Date  . Dental surgery  Oct.  2011    several extractions, bone graft  . I&d extremity Left 01/06/2013    Procedure: IRRIGATION AND DEBRIDEMENT LEFT ELBOW AND LEFT FOREARM ;  Surgeon: [name], MD;  Location: [facility];  Service: Orthopedics;  Laterality: Left;  . Application of wound vac Left 01/06/2013    Procedure: APPLICATION OF WOUND VAC X 2;  Surgeon: [name], MD;  Location: [facility];  Service: Orthopedics;  Laterality: Left;  left forearm  . Incision and drainage Left 01/09/2013    Procedure: REDO INCISION AND DRAINAGE LEFT ELBOW;  Surgeon: [name], MD;  Location: [facility];  Service: Orthopedics;  Laterality: Left;  SUPINE, UPPER EXTERMITY DRAPE  . Secondary closure of wound Left 01/09/2013    Procedure: SECONDARY CLOSURE OF WOUND  LEFT ELBOW;  Surgeon: [name], MD;  Location: [facility];  Service: Orthopedics;  Laterality: Left;  . Application of wound vac Left 01/09/2013    Procedure: APPLICATION OF WOUND VAC;  Surgeon: [name], MD;  Location: [facility];  Service: Orthopedics;  Laterality: Left;    . carvedilol  12.5 mg Oral BID WC  . furosemide  40 mg Intravenous Q12H   . sodium chloride    . amiodarone 60 mg/hr (04/13/15 1408)  . amiodarone    . amiodarone    . lidocaine 1 mg/min (04/13/15 1519)    No Known Allergies  Social History   Social History  . Marital Status: Single    Spouse Name: N/A  . Number of Children: N/A  . Years of Education: N/A   Occupational History  . Not on  file.   Social History Main Topics  . Smoking status: Former Smoker -- 4 years    Quit date: 12/31/1980  . Smokeless tobacco: Never Used  . Alcohol Use: No  . Drug Use: No  . Sexual Activity: Not on file   Other Topics Concern  . Not on file   Social History Narrative   Divorced.  Lives with daughter and grandson. 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    Family History  Problem Relation Age of Onset  . Cancer Mother   . Diabetes Father   Sister had hypertrophic CM diagnosed on  autopsy.  She denies April 24, 1999 after catheterization of cardiac arrest per patient.  ROS- All systems are reviewed and negative except as per the HPI above  Physical Exam: Telemetry: Filed Vitals:   04/13/15 1615 04/13/15 1630 04/13/15 1645 04/13/15 1700  BP: 154/85 139/80 130/94 126/109  Pulse: 61 70  74  Temp:      TempSrc:      Resp: 25  21 26   SpO2: 95% 93% 95% 96%    GEN- The patient is well appearing, alert and oriented x 3 today.   Head- normocephalic, atraumatic Eyes-  Sclera clear, conjunctiva pink Ears- hearing intact Oropharynx- clear Neck- supple,   Lungs- Clear to ausculation bilaterally, normal work of breathing Heart- Regular rate and rhythm  GI- soft, NT, ND, + BS Extremities- no clubbing, cyanosis, or edema MS- no significant deformity or atrophy Skin- no rash or lesion Psych- euthymic mood, full affect Neuro- strength and sensation are intact  EKGs are reviewed  Labs:   Lab Results  Component Value Date   WBC 8.2 04/13/2015   HGB 13.9 04/13/2015   HCT 41.0 04/13/2015   MCV 88.5 04/13/2015   PLT 402* 04/13/2015    Recent Labs Lab 04/13/15 1355 04/13/15 1419  NA 139 142  K 3.5 3.5  CL 103 102  CO2 25  --   BUN 22* 27*  CREATININE 0.75 0.60  CALCIUM 8.9  --   PROT 6.8  --   BILITOT 1.7*  --   ALKPHOS 111  --   ALT 24  --   AST 25  --   GLUCOSE 164* 167*   Lab Results  Component Value Date   TROPONINI 0.24* 04/01/2015    Lab Results  Component Value Date   CHOL 133 12/31/2012   CHOL 155 12/31/2012   Lab Results  Component Value Date   HDL 8* 12/31/2012   HDL 7* 12/31/2012   Lab Results  Component Value Date   LDLCALC 98 12/31/2012   LDLCALC 117* 12/31/2012   Lab Results  Component Value Date   TRIG 137 12/31/2012   TRIG 153* 12/31/2012   Lab Results  Component Value Date   CHOLHDL 16.6 12/31/2012   CHOLHDL 22.1 12/31/2012   No results found for: LDLDIRECT    ASSESSMENT AND PLAN:  1. VT The patient has a RBB  superior axis VT at 400 msec with symptoms and hypotension.  She has been stabilized with amiodarone and IV lidocaine.  She will be admitted for inpatient management.  I hope to wean off her lidocaine overnight and then convert amiodarone to oral on Sunday.  She should have cath to exclude ischemia, though given that her sister died 81 years ago (1999/04/24) suddenly post cath, she is very clear that she is not interested in cath.  Could consider cardiac CT as an alternative to exclude CAD.  I  would anticipate that she will require ICD for secondary treatment of VT given her structural heart disease and hemodynamically unstable arrhythmia prior to discharge.  2. Atrial fibrillation Previously on amiodarone Resume amiodarone for VT  Continue xarelto  3. Acute on chronic diastolic dysfunction Will need diuresis over the weekend  4. Diabetes/ prior urosepsis Seems to be resolved  She has been seen by Dr Gwenlyn Found and is already admitted to Medicine service.  If stable from a medicine standpoint, I would be happy to take her on my service tomorrow.  I doubt that there is much for medicine to add.  I will follow over the weekend   Thompson Grayer, MD 04/13/2015  5:29 PM

## 2015-04-13 NOTE — ED Provider Notes (Signed)
CSN: [id number]     Arrival date & time 04/13/15  1329 History   First MD Initiated Contact with Patient 04/13/15 1336     Chief Complaint  Patient presents with  . Irregular Heart Beat  . Fatigue     (Consider location/radiation/quality/duration/timing/severity/associated sxs/prior Treatment) Patient is a 69 y.o. female presenting with palpitations.  Palpitations Palpitations quality:  Fast Onset quality:  Sudden Duration:  30 seconds Timing:  Intermittent Progression:  Waxing and waning Chronicity:  New Context comment:  Recent illness Relieved by:  None tried Worsened by:  Nothing Ineffective treatments:  None tried Associated symptoms: chest pain, diaphoresis, near-syncope and shortness of breath   Associated symptoms: no cough, no nausea and no vomiting   Risk factors: hx of atrial fibrillation     Past Medical History  Diagnosis Date  . Diabetes mellitus without complication     dx on wed  . Mitral valve prolapse   . Atrial flutter   . Atrial fibrillation    Past Surgical History  Procedure Laterality Date  . Dental surgery  Oct. 2011    several extractions, bone graft  . I&d extremity Left 01/06/2013    Procedure: IRRIGATION AND DEBRIDEMENT LEFT ELBOW AND LEFT FOREARM ;  Surgeon: [name], MD;  Location: [facility];  Service: Orthopedics;  Laterality: Left;  . Application of wound vac Left 01/06/2013    Procedure: APPLICATION OF WOUND VAC X 2;  Surgeon: [name], MD;  Location: [facility];  Service: Orthopedics;  Laterality: Left;  left forearm  . Incision and drainage Left 01/09/2013    Procedure: REDO INCISION AND DRAINAGE LEFT ELBOW;  Surgeon: [name], MD;  Location: [facility];  Service: Orthopedics;  Laterality: Left;  SUPINE, UPPER EXTERMITY DRAPE  . Secondary closure of wound Left 01/09/2013    Procedure: SECONDARY CLOSURE OF WOUND  LEFT ELBOW;  Surgeon: [name], MD;  Location: [facility];  Service: Orthopedics;  Laterality: Left;  . Application of  wound vac Left 01/09/2013    Procedure: APPLICATION OF WOUND VAC;  Surgeon: [name], MD;  Location: [facility];  Service: Orthopedics;  Laterality: Left;   Family History  Problem Relation Age of Onset  . Cancer Mother   . Diabetes Father    Social History  Substance Use Topics  . Smoking status: Former Smoker -- 4 years    Quit date: 12/31/1980  . Smokeless tobacco: Never Used  . Alcohol Use: No   OB History    Gravida Para Term Preterm AB TAB SAB Ectopic Multiple Living            1     Review of Systems  Constitutional: Positive for diaphoresis. Negative for fever and chills.  HENT: Negative for congestion and sore throat.   Eyes: Negative for visual disturbance.  Respiratory: Positive for chest tightness and shortness of breath. Negative for cough and wheezing.   Cardiovascular: Positive for chest pain, palpitations, leg swelling and near-syncope.  Gastrointestinal: Negative for nausea, vomiting, abdominal pain, diarrhea and constipation.  Genitourinary: Negative for dysuria, difficulty urinating and vaginal pain.  Musculoskeletal: Negative for myalgias and arthralgias.  Skin: Negative for rash.  Neurological: Negative for syncope and headaches.  Psychiatric/Behavioral: Negative for behavioral problems.  All other systems reviewed and are negative.     Allergies  Review of patient's allergies indicates no known allergies.  Home Medications   Prior to Admission medications   Medication Sig Start Date End Date Taking?  Authorizing Provider  cephALEXin (KEFLEX) 500 MG capsule Take 1 capsule (500 mg total) by mouth every 8 (eight) hours. 04/05/15  Yes Reyne Dumas, MD  furosemide (LASIX) 40 MG tablet Take 1 tablet (40 mg total) by mouth daily. 04/05/15  Yes Reyne Dumas, MD  metoprolol succinate (TOPROL-XL) 25 MG 24 hr tablet Take 1.5 tablets (37.5 mg total) by mouth 2 (two) times daily. 04/05/15  Yes Reyne Dumas, MD  rivaroxaban (XARELTO) 20 MG TABS tablet Take 1 tablet (20  mg total) by mouth daily. 04/05/15  Yes Reyne Dumas, MD   BP 126/109 mmHg  Pulse 74  Temp(Src) 97.8 F (36.6 C) (Oral)  Resp 26  SpO2 96% Physical Exam  Constitutional: She is oriented to person, place, and time. She appears well-developed and well-nourished. She appears distressed.  HENT:  Head: Normocephalic and atraumatic.  Eyes: EOM are normal.  Neck: Normal range of motion.  Cardiovascular: An irregularly irregular rhythm present. Tachycardia present.   Pulmonary/Chest: Effort normal and breath sounds normal. No respiratory distress. She has no wheezes.  B/l crackles  Abdominal: Soft. There is no tenderness.  Musculoskeletal: She exhibits edema.  Neurological: She is alert and oriented to person, place, and time.  Skin: She is not diaphoretic.  Psychiatric: She has a normal mood and affect. Her behavior is normal.    ED Course  Procedures (including critical care time) Labs Review Labs Reviewed  CBC WITH DIFFERENTIAL/PLATELET - Abnormal; Notable for the following:    RDW 16.2 (*)    Platelets 402 (*)    All other components within normal limits  COMPREHENSIVE METABOLIC PANEL - Abnormal; Notable for the following:    Glucose, Bld 164 (*)    BUN 22 (*)    Total Bilirubin 1.7 (*)    All other components within normal limits  BRAIN NATRIURETIC PEPTIDE - Abnormal; Notable for the following:    B Natriuretic Peptide 740.3 (*)    All other components within normal limits  I-STAT CHEM 8, ED - Abnormal; Notable for the following:    BUN 27 (*)    Glucose, Bld 167 (*)    Calcium, Ion 1.10 (*)    All other components within normal limits  URINE CULTURE  MAGNESIUM  PHOSPHORUS  URINALYSIS, ROUTINE W REFLEX MICROSCOPIC (NOT AT [facility])  Randolm Idol, ED  I-STAT CG4 LACTIC ACID, ED    Imaging Review Dg Chest Port 1 View  04/13/2015   CLINICAL DATA:  Chest pain  EXAM: PORTABLE CHEST - 1 VIEW  COMPARISON:  03/31/2015  FINDINGS: Cardiac enlargement. Diffuse bilateral airspace  disease has progressed since the prior study most consistent with pulmonary edema. Small bilateral pleural effusions with bibasilar atelectasis.  IMPRESSION: Congestive heart failure with bilateral edema and small pleural effusions. Worsening of edema since the prior study.   Electronically Signed   By: Franchot Gallo M.D.   On: 04/13/2015 14:02   I have personally reviewed and evaluated these images and lab results as part of my medical decision-making.   EKG Interpretation   Date/Time:  Friday April 13 2015 13:59:39 EDT Ventricular Rate:  153 PR Interval:    QRS Duration: 145 QT Interval:  351 QTC Calculation: 560 R Axis:   -89 Text Interpretation:  Vtach  Right bundle branch block Anterolateral  infarct, age indeterminate Confirmed by YAO  MD, DAVID ([id number]) on 04/13/2015  2:08:41 PM      MDM   Final diagnoses:  VT (ventricular tachycardia)     Patient is  a 69 year old female with a history of hypertrophic cardiomyopathy, atrial fibrillation on Xarelto, recent admission for sepsis secondary to UTI that presents with one day of intermittent palpitations and lightheadedness. In the ambulance the patient had a 20 beat run of V. tach 3. On arrival to the ED the patient is tachycardic, atrial fibrillation however intermittently will go into 1 minute runs of V. tach. Patient was given amiodarone bolus and started on a drip. Initially this controlled her rate however 30 minutes later she began going into prolonged runs of V. tach again with hypotension. Patient was still mentating so was given a bolus of lidocaine. This did not work so patient was given another amiodarone bolus. Patient is currently on amiodarone drip and lidocaine drip. Cardiology was consult and and was will follow the patient but recommend admission to the ICU given her recent sepsis. Patient was found to also be in a heart failure exacerbation with pulmonary edema on x-ray and a BNP of 750.    Renne Musca,  MD 04/13/15 Cody Yao, MD 04/14/15 [phone]

## 2015-04-13 NOTE — ED Notes (Signed)
Pt a x 4, NAD, VSS, pt Afib 70s at this time.

## 2015-04-13 NOTE — ED Notes (Signed)
Amio bolus 150mg  per MD at bedside.

## 2015-04-13 NOTE — ED Notes (Addendum)
CARDS MD-BERRY AT BEDSIDE

## 2015-04-13 NOTE — Consult Note (Signed)
Patient ID: [name] MRN: [id number], DOB/AGE: 10-07-1945   Admit date: 04/13/2015   Primary Physician: Lujean Amel, MD Primary Cardiologist: Dr. Mare Ferrari  Reason for Consult: VT  Problem List  Past Medical History  Diagnosis Date  . Diabetes mellitus without complication     dx on wed  . Mitral valve prolapse   . Atrial flutter   . Atrial fibrillation     Past Surgical History  Procedure Laterality Date  . Dental surgery  Oct. 2011    several extractions, bone graft  . I&d extremity Left 01/06/2013    Procedure: IRRIGATION AND DEBRIDEMENT LEFT ELBOW AND LEFT FOREARM ;  Surgeon: [name], MD;  Location: [facility];  Service: Orthopedics;  Laterality: Left;  . Application of wound vac Left 01/06/2013    Procedure: APPLICATION OF WOUND VAC X 2;  Surgeon: [name], MD;  Location: [facility];  Service: Orthopedics;  Laterality: Left;  left forearm  . Incision and drainage Left 01/09/2013    Procedure: REDO INCISION AND DRAINAGE LEFT ELBOW;  Surgeon: [name], MD;  Location: [facility];  Service: Orthopedics;  Laterality: Left;  SUPINE, UPPER EXTERMITY DRAPE  . Secondary closure of wound Left 01/09/2013    Procedure: SECONDARY CLOSURE OF WOUND  LEFT ELBOW;  Surgeon: [name], MD;  Location: [facility];  Service: Orthopedics;  Laterality: Left;  . Application of wound vac Left 01/09/2013    Procedure: APPLICATION OF WOUND VAC;  Surgeon: [name], MD;  Location: [facility];  Service: Orthopedics;  Laterality: Left;     Allergies  No Known Allergies  HPI  The patient is a 69 yr old woman (retired Mining engineer at [employer]) with a PMH significant for atrial fibrillation dating back to at least June 2014 with mild apical hypertrophic cardiomyopathy found at that time. She also has diabetes.  She was recently hospitalized here at [facility] 03/30/15-04/05/15 for urosepsis and near syncope. General cardiology was consulted as she was also found to be in atrial  fibrillation w/ RVR. Per notes, she had been on amiodarone and Xarelto in 2014 but had self discontinued meds on her own. Durring her recent hospitalization, 2D echo revealed normal LVF with EF of 58-09% probable diastolic dysfunction, elevated LVEDP and left atrial pressures, mild concentric and severe focal basal septal hypertrophy, severe left atrial enlargement, mild to moderate RA enlargement, moderate mitral regurgitation, moderately reduced RV systolic function, and moderate to severe tricuspid regurgitation with mildly elevated pulmonary pressures (40 mmHg). No mitral valve prolapse seen. ECG showed atrial fibrillation, nonspecific IVCD, and possible old anteroseptal infarct. Troponins 0.05-->0.07-->0.17-->0.19-->0.24. Chest xray 8/27 suspicious for CHF (treated with lasix).  Given her severe LA enlargement and obesity with elevated filling pressure, it was felt that she would be unable to maintain sinus rhythm for a significant length of time, therefore rate control strategy was elected. She was placed on Toprol-XL, 37.5 mg BID and her rate improved. Xarelto was restarted. She did not undergo any ischemic w/u at that time, as it was felt her abnormal troponins were due to demand ischemia. However, an outpatient stress test was recommended.   She was discharged home on 04/05/15. Since leaving the hospital she has felt poorly, noting weakness, palpitations and dizziness. Today, she developed fatigue, n/v and called EMS. When EMS arrived she had frequent (q 1 min) runs of Vtach. She was transported to the ED. On arrival, she continued to have VT in the 150s. Labs showed  K at 3.5 and at Mg 1.9. She was given 2 mg of Mg and IV amiodarone bolus + infusion was initiated. Despite this, she continue to have sustained runs of VT. She then became hypotensive with systolic pressure in the 12X. HR in the 160s and symptomatic. Additional bolus of IV amiodarone was given + IV lidocaine bolus and infusion, along with  calcium gluconate and IV Mg.  She is now back in afib. HR is in the 70s. BP is stable. Symptoms improved.  She denies any recent h/o CP.    Home Medications  Prior to Admission medications   Medication Sig Start Date End Date Taking? Authorizing Provider  cephALEXin (KEFLEX) 500 MG capsule Take 1 capsule (500 mg total) by mouth every 8 (eight) hours. 04/05/15   Reyne Dumas, MD  furosemide (LASIX) 40 MG tablet Take 1 tablet (40 mg total) by mouth daily. 04/05/15   Reyne Dumas, MD  metoprolol succinate (TOPROL-XL) 25 MG 24 hr tablet Take 1.5 tablets (37.5 mg total) by mouth 2 (two) times daily. 04/05/15   Reyne Dumas, MD  Phenazopyridine HCl (AZO TABS PO) Take 1 tablet by mouth daily.    Historical Provider, MD  rivaroxaban (XARELTO) 20 MG TABS tablet Take 1 tablet (20 mg total) by mouth daily. 04/05/15   Reyne Dumas, MD    Family History  Family History  Problem Relation Age of Onset  . Cancer Mother   . Diabetes Father     Social History  Social History   Social History  . Marital Status: Single    Spouse Name: N/A  . Number of Children: N/A  . Years of Education: N/A   Occupational History  . Not on file.   Social History Main Topics  . Smoking status: Former Smoker -- 4 years    Quit date: 12/31/1980  . Smokeless tobacco: Never Used  . Alcohol Use: No  . Drug Use: No  . Sexual Activity: Not on file   Other Topics Concern  . Not on file   Social History Narrative   Divorced.  Lives with daughter and grandson. 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     Review of Systems General:  No chills, fever, night sweats or weight changes.  Cardiovascular:  No chest pain, dyspnea on exertion, edema, orthopnea, palpitations, paroxysmal nocturnal dyspnea. Dermatological: No rash, lesions/masses Respiratory: No cough, dyspnea Urologic: No hematuria, dysuria Abdominal:   No nausea, vomiting, diarrhea, bright red blood per rectum, melena, or hematemesis Neurologic:  No  visual changes, wkns, changes in mental status. All other systems reviewed and are otherwise negative except as noted above.  Physical Exam  Blood pressure 117/82, pulse 91, temperature 97.8 F (36.6 C), temperature source Oral, resp. rate 26, SpO2 94 %.  General: Pleasant, NAD Psych: Normal affect. Neuro: Alert and oriented X 3. Moves all extremities spontaneously. HEENT: Normal  Neck: Supple without bruits or JVD. Lungs:  Resp regular and unlabored, bibasilar rales. Heart: irregularly irregular rhythm, regular rate. Abdomen: Soft, non-tender, non-distended, BS + x 4.  Extremities: No clubbing, cyanosis or edema. DP/PT/Radials 2+ and equal bilaterally.  Labs  Troponin (Point of Care Test)  Recent Labs  04/13/15 1410  TROPIPOC 0.02   No results for input(s): CKTOTAL, CKMB, TROPONINI in the last 72 hours. Lab Results  Component Value Date   WBC 8.2 04/13/2015   HGB 13.9 04/13/2015   HCT 41.0 04/13/2015   MCV 88.5 04/13/2015   PLT 402* 04/13/2015  Recent Labs Lab 04/12/15 1403 04/13/15 1419  NA 143 142  K 4.3 3.5  CL 104 102  CO2 29  --   BUN 25* 27*  CREATININE 0.71 0.60  CALCIUM 9.5  --   GLUCOSE 161* 167*   Lab Results  Component Value Date   CHOL 133 12/31/2012   HDL 8* 12/31/2012   LDLCALC 98 12/31/2012   TRIG 137 12/31/2012   No results found for: DDIMER   Radiology/Studies  Dg Chest Port 1 View  04/13/2015   CLINICAL DATA:  Chest pain  EXAM: PORTABLE CHEST - 1 VIEW  COMPARISON:  03/31/2015  FINDINGS: Cardiac enlargement. Diffuse bilateral airspace disease has progressed since the prior study most consistent with pulmonary edema. Small bilateral pleural effusions with bibasilar atelectasis.  IMPRESSION: Congestive heart failure with bilateral edema and small pleural effusions. Worsening of edema since the prior study.   Electronically Signed   By: Franchot Gallo M.D.   On: 04/13/2015 14:02   Dg Chest Port 1 View  03/31/2015   CLINICAL DATA:   Weakness and shortness of breath. Near syncope. Bilateral lower extremity swelling.  EXAM: PORTABLE CHEST - 1 VIEW  COMPARISON:  01/01/2013  FINDINGS: Cardiomegaly is again seen. There is progressive interstitial and perihilar opacities concerning for pulmonary edema. Mild atelectasis at the right lung base. No confluent airspace disease. Questionable blunting of left costophrenic angle. No pneumothorax.  IMPRESSION: Findings suspicious for CHF.   Electronically Signed   By: Jeb Levering M.D.   On: 03/31/2015 01:21    ECG  VT, HR 153  ASSESSMENT AND PLAN  1. VTach: patient now stable on IV amiodarone and IV lidocaine. Monitor electrolytes. Keep K ~4.0 and Mg ~ 2.0. Recheck 2D echo. Will need ischemic eval prior to discharge. Will ask EP to see.   2. Afib: rate is now controlled. On IV amiodarone. Change BB from metoprolol to Coreg, 12.5 mg BID. Continue Xarelto.   3. CHF: BNP elevated and CXR shows pulmonary edema. Recheck 2D echo to reassess LVF. Given IV Lasix, 40 mg BID. F/u BMP in the am. Change BB to Coreg, 12.5 mg BID.   5. Hypertrophic Cardiomyopathy: repeat 2D echo.   4. Urosepsis: patient recently admitted for urosepsis 03/30/15-04/05/15. Was on antibiotics. Hasn't felt well since discharge. IM to re-evaluate.  5. DM: management per IM.   Signed, Lyda Jester, PA-C 04/13/2015, 2:38 PM   Agree with note written by Ellen Henri  PAC  Pt recently D/Cd after being Rx for Urosepsis . Hasn't felt well since then. H/O PAF. On NOAC and BB. 2D shows nl LV Fxn, LAE and HOCM with basal septal hypertrophy. She has had mult episodes of VT on top of baseline AFIB. Rx with 2 loads of Amio as well as Lido--->> Drips. Currently feels better in AFIB. Agree with ischemic w/o. Have asked EP to eval as well as TRH for recent Urosepsis admission.  Quay Burow 04/13/2015 4:40 PM

## 2015-04-13 NOTE — ED Notes (Signed)
Pt resting, HR 70s Afib, VSS at this time.

## 2015-04-13 NOTE — H&P (Signed)
PULMONARY / CRITICAL CARE MEDICINE   Name: [name] MRN: [id number] DOB: Jul 22, 1946    ADMISSION DATE:  04/13/2015 CONSULTATION DATE:  04/13/15  REFERRING MD : ED  CHIEF COMPLAINT:  Weakness and palpitations  INITIAL PRESENTATION: Presents to the ED with weakness, palpitations and dizziness. EMS found her to have frequent runs of V. tach. Transported to the ED where she continued to have VTs with associated hypotension she was given amiodarone bolus 150 mg and infusion was started. Ca, K and mag were repleted. She stabilized initially but then went back into VT with hypotension. An additional bolus of amiodarone was given and a lidocaine bolus and infusion was started. PCCM called to admit.  STUDIES:  Echo (03/31/15)- LVEF 55-60%. Diastolic dysfunction and LVH, left atrium is severely dilated. mild to moderate TR  SIGNIFICANT EVENTS:  HISTORY OF PRESENT ILLNESS:  69 year old with history of diabetes mellitus, mitral valve prolapse, atrial flutter and fibrillation. She was hospitalized the end of August with urosepsis. At that point she was found to be in atrial fibrillation with RVR. Cardiology was consulted and she was started on treatment control and and coagulation with Xarelto. Discharge to home on September 1. She continued to feel unwell at home with general malaise and palpitations.  PAST MEDICAL HISTORY :   has a past medical history of Diabetes mellitus without complication; Mitral valve prolapse; Atrial flutter; and Atrial fibrillation.  has past surgical history that includes Dental surgery (Oct. 2011); I&D extremity (Left, 01/06/2013); Application if wound vac (Left, 01/06/2013); Incision and drainage (Left, 01/09/2013); Secondary closure of wound (Left, 04/11/3531); and Application if wound vac (Left, 01/09/2013). Prior to Admission medications   Medication Sig Start Date End Date Taking? Authorizing Provider  cephALEXin (KEFLEX) 500 MG capsule Take 1 capsule (500 mg total) by mouth  every 8 (eight) hours. 04/05/15  Yes Reyne Dumas, MD  furosemide (LASIX) 40 MG tablet Take 1 tablet (40 mg total) by mouth daily. 04/05/15  Yes Reyne Dumas, MD  metoprolol succinate (TOPROL-XL) 25 MG 24 hr tablet Take 1.5 tablets (37.5 mg total) by mouth 2 (two) times daily. 04/05/15  Yes Reyne Dumas, MD  rivaroxaban (XARELTO) 20 MG TABS tablet Take 1 tablet (20 mg total) by mouth daily. 04/05/15  Yes Reyne Dumas, MD   No Known Allergies  FAMILY HISTORY:  indicated that her mother is deceased. She indicated that her father is deceased. She indicated that two of her three sisters are alive.  SOCIAL HISTORY:  reports that she quit smoking about 34 years ago. She has never used smokeless tobacco. She reports that she does not drink alcohol or use illicit drugs.  REVIEW OF SYSTEMS:   Positive for nausea or vomiting malaise and fatigue palpitations. Denies any chest pain shortness of breath wheezing, hemoptysis. All other review of systems negative  SUBJECTIVE:   VITAL SIGNS: Temp:  [97.8 F (36.6 C)] 97.8 F (36.6 C) (09/09 1336) Pulse Rate:  [61-117] 70 (09/09 1630) Resp:  [17-37] 25 (09/09 1615) BP: (117-154)/(66-91) 139/80 mmHg (09/09 1630) SpO2:  [93 %-99 %] 93 % (09/09 1630) HEMODYNAMICS:   VENTILATOR SETTINGS:   INTAKE / OUTPUT: No intake or output data in the 24 hours ending 04/13/15 1645  PHYSICAL EXAMINATION: General: Pleasant, no distress Neuro:  No gross focal deficits HEENT:  PERRLA, EOMI, moist mucous membranes Cardiovascular:  Irregular rate, no murmurs rubs or gallops Lungs:  Clear with no wheeze or crackles Abdomen:  Soft, positive bowel sounds, nontender Skin:  Intact  LABS:  CBC  Recent Labs Lab 04/13/15 1355 04/13/15 1419  WBC 8.2  --   HGB 12.1 13.9  HCT 39.2 41.0  PLT 402*  --    Coag's No results for input(s): APTT, INR in the last 168 hours. BMET  Recent Labs Lab 04/12/15 1403 04/13/15 1355 04/13/15 1419  NA 143 139 142  K 4.3 3.5 3.5   CL 104 103 102  CO2 29 25  --   BUN 25* 22* 27*  CREATININE 0.71 0.75 0.60  GLUCOSE 161* 164* 167*   Electrolytes  Recent Labs Lab 04/12/15 1403 04/13/15 1355  CALCIUM 9.5 8.9  MG  --  1.9  PHOS  --  3.8   Sepsis Markers  Recent Labs Lab 04/13/15 1412  LATICACIDVEN 1.47   ABG No results for input(s): PHART, PCO2ART, PO2ART in the last 168 hours. Liver Enzymes  Recent Labs Lab 04/13/15 1355  AST 25  ALT 24  ALKPHOS 111  BILITOT 1.7*  ALBUMIN 3.9   Cardiac Enzymes No results for input(s): TROPONINI, PROBNP in the last 168 hours. Glucose No results for input(s): GLUCAP in the last 168 hours.  Imaging Dg Chest Port 1 View  04/13/2015   CLINICAL DATA:  Chest pain  EXAM: PORTABLE CHEST - 1 VIEW  COMPARISON:  03/31/2015  FINDINGS: Cardiac enlargement. Diffuse bilateral airspace disease has progressed since the prior study most consistent with pulmonary edema. Small bilateral pleural effusions with bibasilar atelectasis.  IMPRESSION: Congestive heart failure with bilateral edema and small pleural effusions. Worsening of edema since the prior study.   Electronically Signed   By: Franchot Gallo M.D.   On: 04/13/2015 14:02     ASSESSMENT / PLAN:  PULMONARY A:No issues. Stable on minimal O2 CHF on CXR. P:   Wean down oxygen as tolerated Diurese.  CARDIOVASCULAR A:  Recurrent runs of VT. Atrial fibrillation. HFpEF Congestive heart failure. P:  Continue IV amiodarone, lidocaine and replete electrolytes. Monitor BMP. Keep K ~4 and Mg ~2. 2-D echocardiogram. EP evaluation. Trend troponins. Lasix 40 mg IV bid Coreg for A. Fib. Heparin drip for anticoagulation Cariology is following.  RENAL A:  Stable P:     GASTROINTESTINAL A:  No issues P:   Keep NPO for now.  HEMATOLOGIC A:  No issues P:    INFECTIOUS A:  Recent episode of Klebsiella urosepsis P:   BCx2  UC  Sputum  Not on antibiotics. We'll continue to monitor. Repeat UA   FAMILY   - Updates: Pt updated - Inter-disciplinary family meet or Palliative Care meeting due by: 9/16  TODAY'S SUMMARY: Recurrent runs of VTs. Electrolytes repleted and on Amio and liodcaine drip.  Marshell Garfinkel MD [facility] Pager [phone] If no answer or after 3pm call: [phone] 04/13/2015, 4:45 PM

## 2015-04-14 ENCOUNTER — Inpatient Hospital Stay (HOSPITAL_COMMUNITY): Payer: Medicare Other

## 2015-04-14 DIAGNOSIS — J9600 Acute respiratory failure, unspecified whether with hypoxia or hypercapnia: Secondary | ICD-10-CM | POA: Insufficient documentation

## 2015-04-14 DIAGNOSIS — J9601 Acute respiratory failure with hypoxia: Secondary | ICD-10-CM

## 2015-04-14 LAB — BLOOD GAS, ARTERIAL
Acid-Base Excess: 0.2 mmol/L (ref 0.0–2.0)
Bicarbonate: 24.7 mEq/L — ABNORMAL HIGH (ref 20.0–24.0)
DRAWN BY: 418751
FIO2: 0.21
O2 SAT: 95.5 %
PCO2 ART: 41.5 mmHg (ref 35.0–45.0)
PO2 ART: 78.1 mmHg — AB (ref 80.0–100.0)
Patient temperature: 97.3
TCO2: 26 mmol/L (ref 0–100)
pH, Arterial: 7.388 (ref 7.350–7.450)

## 2015-04-14 LAB — BASIC METABOLIC PANEL
ANION GAP: 9 (ref 5–15)
BUN: 21 mg/dL — AB (ref 6–20)
CHLORIDE: 102 mmol/L (ref 101–111)
CO2: 27 mmol/L (ref 22–32)
Calcium: 8.4 mg/dL — ABNORMAL LOW (ref 8.9–10.3)
Creatinine, Ser: 0.81 mg/dL (ref 0.44–1.00)
GFR calc Af Amer: >60 mL/min (ref >60)
GFR calc non Af Amer: >60 mL/min (ref >60)
GLUCOSE: 139 mg/dL — AB (ref 65–99)
POTASSIUM: 3.2 mmol/L — AB (ref 3.5–5.1)
Sodium: 138 mmol/L (ref 135–145)

## 2015-04-14 LAB — HEPARIN LEVEL (UNFRACTIONATED): Heparin Unfractionated: >2.2 IU/mL — ABNORMAL HIGH (ref 0.30–0.70)

## 2015-04-14 LAB — MAGNESIUM: MAGNESIUM: 2.2 mg/dL (ref 1.7–2.4)

## 2015-04-14 LAB — TROPONIN I: Troponin I: 0.05 ng/mL — ABNORMAL HIGH (ref <0.031)

## 2015-04-14 LAB — PHOSPHORUS: PHOSPHORUS: 4.7 mg/dL — AB (ref 2.5–4.6)

## 2015-04-14 MED ORDER — CARVEDILOL 25 MG PO TABS: 25.0000 mg | ORAL_TABLET | Freq: Two times a day (BID) | ORAL | Status: DC

## 2015-04-14 MED ORDER — POTASSIUM CHLORIDE 20 MEQ PO PACK
40.0000 meq | PACK | Freq: Two times a day (BID) | ORAL | Status: DC
  Administered 2015-04-14: 40 meq via ORAL
  Filled 2015-04-14 (×2): qty 2

## 2015-04-14 MED ORDER — AMIODARONE HCL 200 MG PO TABS
200.0000 mg | ORAL_TABLET | Freq: Two times a day (BID) | ORAL | Status: DC
  Administered 2015-04-14 – 2015-04-18 (×8): 200 mg via ORAL
  Filled 2015-04-14 (×9): qty 1

## 2015-04-14 MED ORDER — POTASSIUM CHLORIDE CRYS ER 20 MEQ PO TBCR
40.0000 meq | EXTENDED_RELEASE_TABLET | Freq: Two times a day (BID) | ORAL | Status: DC
  Administered 2015-04-14 – 2015-04-15 (×3): 40 meq via ORAL
  Filled 2015-04-14 (×3): qty 2

## 2015-04-14 NOTE — Progress Notes (Signed)
Utilization Review Completed.[name] T9/05/2015  

## 2015-04-14 NOTE — Progress Notes (Signed)
Pt complains of feeling "week, tired, and not well" pt appears pale/ashen with tele reading SB 40's; BP 60/30 per flowsheets; pt appears to have p waves visible in EKG; 12 lead EKG completed; MD aware-Allred; orders received; will continue to closely monitor

## 2015-04-14 NOTE — Progress Notes (Signed)
SUBJECTIVE: The patient is doing well today.  SOB is better but persists.   At this time, she denies chest pain or any new concerns.  . carvedilol  25 mg Oral BID WC  . furosemide  40 mg Intravenous Q12H  . potassium chloride  40 mEq Oral BID  . rivaroxaban  20 mg Oral Q supper   . amiodarone 30 mg/hr (04/14/15 1026)    OBJECTIVE: Physical Exam: Filed Vitals:   04/14/15 0400 04/14/15 0500 04/14/15 0809 04/14/15 1146  BP:      Pulse:      Temp: 97.4 F (36.3 C)  98.8 F (37.1 C) 97.5 F (36.4 C)  TempSrc: Oral  Axillary   Resp:      Weight:  79.5 kg (175 lb 4.3 oz)    SpO2:        Intake/Output Summary (Last 24 hours) at 04/14/15 1207 Last data filed at 04/14/15 0200  Gross per 24 hour  Intake 277.11 ml  Output    350 ml  Net -72.89 ml    Telemetry reveals rate controlled afib and atrial flutter, no further VT  GEN- The patient is better appearing, alert and oriented x 3 today.   Head- normocephalic, atraumatic Eyes-  Sclera clear, conjunctiva pink Ears- hearing intact Oropharynx- clear Neck- supple, + JVD Lungs- bibasilar rales throughout, improved work of breathing Heart- irregular rate and rhythm  GI- soft, NT, ND, + BS Extremities- no clubbing, cyanosis, + dependant edema Skin- no rash or lesion Psych- euthymic mood, full affect Neuro- strength and sensation are intact  LABS: Basic Metabolic Panel:  Recent Labs  04/13/15 2112 04/14/15 0430  NA 140 138  K 3.6 3.2*  CL 105 102  CO2 29 27  GLUCOSE 187* 139*  BUN 23* 21*  CREATININE 0.84 0.81  CALCIUM 8.6* 8.4*  MG 2.3 2.2  PHOS 4.4 4.7*   Liver Function Tests:  Recent Labs  04/13/15 1355  AST 25  ALT 24  ALKPHOS 111  BILITOT 1.7*  PROT 6.8  ALBUMIN 3.9   No results for input(s): LIPASE, AMYLASE in the last 72 hours. CBC:  Recent Labs  04/13/15 1355 04/13/15 1419 04/14/15 0430  WBC 8.2  --  6.1  NEUTROABS 5.8  --   --   HGB 12.1 13.9 10.8*  HCT 39.2 41.0 35.4*  MCV 88.5   --  90.5  PLT 402*  --  301   Cardiac Enzymes:  Recent Labs  04/13/15 2003 04/13/15 2112 04/14/15 0430  TROPONINI 0.04* 0.07* 0.05*    ASSESSMENT AND PLAN:  ASSESSMENT AND PLAN:  1. 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 She has been stabilized with amiodarone and IV lidocaine. I will stop IV lidocaine at this time.  Plan to convert amiodarone to oral on Sunday. She should have cath to exclude ischemia, though given that her sister died 11 years ago (April 29, 1999) suddenly post cath, she is very clear that she is not interested in cath. Could consider cardiac CT as an alternative to exclude CAD.  I will order cardiac CT for Monday am.  If no advanced occlusive disease, would proceed with ICD for secondary treatment of VT given her structural heart disease and hemodynamically unstable arrhythmia Monday afternoon. Minimally elevated troponin is likely secondary to Vt and does not represent MI.  2. Atrial fibrillation Previously on amiodarone Resume amiodarone for VT  Continue xarelto.  Will hold 24 hours prior  to ICD implant  3. Acute on chronic diastolic dysfunction Remains volume overloaded Continue IV diuresis  4. UTI/prior urosepsis Seems to be resolved  5. DM Stable No change required today  Transfer to stepdown  Thompson Grayer, MD 04/14/2015 12:07 PM

## 2015-04-14 NOTE — Progress Notes (Signed)
PCCM PROGRESS NOTE  ADMISSION DATE: 04/13/2015 REFERRING PROVIDER: ER  CC: VT  SUBJECTIVE: Breathing better.  Denies chest pain.  Tolerating diet.  OBJECTIVE: Temp:  [97.4 F (36.3 C)-97.8 F (36.6 C)] 97.4 F (36.3 C) (09/10 0400) Pulse Rate:  [51-117] 51 (09/09 2000) Resp:  [17-37] 27 (09/10 0200) BP: (109-154)/(57-109) 109/67 mmHg (09/10 0200) SpO2:  [93 %-99 %] 98 % (09/09 2000) Weight:  [175 lb 4.3 oz (79.5 kg)-180 lb (81.647 kg)] 175 lb 4.3 oz (79.5 kg) (09/10 0500) General: pleasant HEENT: pupils reactive Cardiac: regular Chest: no wheeze Abd: soft, non tender Ext: no edema Neuro: normal strength Skin: no rashes   CMP Latest Ref Rng 04/14/2015 04/13/2015 04/13/2015  Glucose 65 - 99 mg/dL 139(H) 187(H) 167(H)  BUN 6 - 20 mg/dL 21(H) 23(H) 27(H)  Creatinine 0.44 - 1.00 mg/dL 0.81 0.84 0.60  Sodium 135 - 145 mmol/L 138 140 142  Potassium 3.5 - 5.1 mmol/L 3.2(L) 3.6 3.5  Chloride 101 - 111 mmol/L 102 105 102  CO2 22 - 32 mmol/L 27 29 -  Calcium 8.9 - 10.3 mg/dL 8.4(L) 8.6(L) -  Total Protein 6.5 - 8.1 g/dL - - -  Total Bilirubin 0.3 - 1.2 mg/dL - - -  Alkaline Phos 38 - 126 U/L - - -  AST 15 - 41 U/L - - -  ALT 14 - 54 U/L - - -     CBC Latest Ref Rng 04/14/2015 04/13/2015 04/13/2015  WBC 4.0 - 10.5 K/uL 6.1 - 8.2  Hemoglobin 12.0 - 15.0 g/dL 10.8(L) 13.9 12.1  Hematocrit 36.0 - 46.0 % 35.4(L) 41.0 39.2  Platelets 150 - 400 K/uL 301 - 402(H)     Dg Chest Port 1 View  04/13/2015   CLINICAL DATA:  Chest pain  EXAM: PORTABLE CHEST - 1 VIEW  COMPARISON:  03/31/2015  FINDINGS: Cardiac enlargement. Diffuse bilateral airspace disease has progressed since the prior study most consistent with pulmonary edema. Small bilateral pleural effusions with bibasilar atelectasis.  IMPRESSION: Congestive heart failure with bilateral edema and small pleural effusions. Worsening of edema since the prior study.   Electronically Signed   By: Franchot Gallo M.D.   On: 04/13/2015 14:02    CULTURES: Urine 9/09 >>  STUDIES:  EVENTS: 9/09 Admit, EP cardiology consulted, started amiodarone/lidocaine IV  DISCUSSION: 69 yo female with palpitations, weakness, dizziness from VT.  ASSESSMENT/PLAN:  Ventricular tachycardia. Acute systolic CHF in setting of VT >> improved. Hx of MVP, A fib/flutter. Plan: - amiodarone, lidocaine per cardiology - xarelto per cardiology  Recent Klebsiella UTI. Plan: - f/u urine cx - monitor of Abx for now  Hypokalemia. Hypomagnesemia. Plan: - goal K > 4, goal Mg > 2  Anemia of critical illness. Plan: - f/u CBC  No active issues for critical care service.  Will transfer to Dr. Jackalyn Lombard service 9/10 and PCCM sign off.  Chesley Mires, MD [facility]/Critical Care 04/14/2015, 7:35 AM Pager:  [phone] After 3pm call: [phone]

## 2015-04-15 DIAGNOSIS — I481 Persistent atrial fibrillation: Secondary | ICD-10-CM

## 2015-04-15 LAB — URINE CULTURE

## 2015-04-15 LAB — BASIC METABOLIC PANEL
Anion gap: 8 (ref 5–15)
BUN: 29 mg/dL — AB (ref 6–20)
CHLORIDE: 105 mmol/L (ref 101–111)
CO2: 26 mmol/L (ref 22–32)
CREATININE: 1.23 mg/dL — AB (ref 0.44–1.00)
Calcium: 8.8 mg/dL — ABNORMAL LOW (ref 8.9–10.3)
GFR calc Af Amer: 51 mL/min — ABNORMAL LOW (ref >60)
GFR calc non Af Amer: 44 mL/min — ABNORMAL LOW (ref >60)
GLUCOSE: 170 mg/dL — AB (ref 65–99)
Potassium: 4.1 mmol/L (ref 3.5–5.1)
SODIUM: 139 mmol/L (ref 135–145)

## 2015-04-15 LAB — CBC
HEMATOCRIT: 35.4 % — AB (ref 36.0–46.0)
HEMATOCRIT: 37.7 % (ref 36.0–46.0)
HEMOGLOBIN: 10.8 g/dL — AB (ref 12.0–15.0)
HEMOGLOBIN: 11.4 g/dL — AB (ref 12.0–15.0)
MCH: 27.6 pg (ref 26.0–34.0)
MCH: 27.1 pg (ref 26.0–34.0)
MCHC: 30.5 g/dL (ref 30.0–36.0)
MCHC: 30.2 g/dL (ref 30.0–36.0)
MCV: 90.5 fL (ref 78.0–100.0)
MCV: 89.8 fL (ref 78.0–100.0)
Platelets: 301 10*3/uL (ref 150–400)
Platelets: 333 10*3/uL (ref 150–400)
RBC: 3.91 MIL/uL (ref 3.87–5.11)
RBC: 4.2 MIL/uL (ref 3.87–5.11)
RDW: 16.3 % — ABNORMAL HIGH (ref 11.5–15.5)
RDW: 16.2 % — ABNORMAL HIGH (ref 11.5–15.5)
WBC: 6.1 10*3/uL (ref 4.0–10.5)
WBC: 7 10*3/uL (ref 4.0–10.5)

## 2015-04-15 LAB — CALCIUM, IONIZED
Calcium, Ionized, Serum: 4.6 mg/dL (ref 4.5–5.6)
Calcium, Ionized, Serum: 4.6 mg/dL (ref 4.5–5.6)

## 2015-04-15 LAB — MAGNESIUM: Magnesium: 2.4 mg/dL (ref 1.7–2.4)

## 2015-04-15 MED ORDER — PNEUMOCOCCAL VAC POLYVALENT 25 MCG/0.5ML IJ INJ
0.5000 mL | INJECTION | INTRAMUSCULAR | Status: AC
  Administered 2015-04-17: 15:00:00 0.5 mL via INTRAMUSCULAR
  Filled 2015-04-15 (×2): qty 0.5

## 2015-04-15 MED ORDER — ALUM & MAG HYDROXIDE-SIMETH 200-200-20 MG/5ML PO SUSP
30.0000 mL | ORAL | Status: DC | PRN
  Administered 2015-04-15 (×2): 30 mL via ORAL
  Filled 2015-04-15 (×2): qty 30

## 2015-04-15 MED ORDER — POTASSIUM CHLORIDE CRYS ER 20 MEQ PO TBCR
20.0000 meq | EXTENDED_RELEASE_TABLET | Freq: Every day | ORAL | Status: DC
  Administered 2015-04-16 – 2015-04-23 (×8): 20 meq via ORAL
  Filled 2015-04-15 (×4): qty 1
  Filled 2015-04-15: qty 2
  Filled 2015-04-15 (×2): qty 1
  Filled 2015-04-15: qty 2

## 2015-04-15 NOTE — Progress Notes (Addendum)
SUBJECTIVE: The patient is doing well today.  SOB is much improved.   At this time, she denies chest pain or any new concerns.  [name] amiodarone  200 mg Oral BID  . [START ON 04/16/2015] pneumococcal 23 valent vaccine  0.5 mL Intramuscular Tomorrow-1000  . [START ON 04/16/2015] potassium chloride  20 mEq Oral Daily  . rivaroxaban  20 mg Oral Q supper      OBJECTIVE: Physical Exam: Filed Vitals:   04/15/15 0000 04/15/15 0400 04/15/15 0500 04/15/15 0716  BP: 122/50 133/81  147/55  Pulse: 52 63  57  Temp: 98 F (36.7 C) 98 F (36.7 C)  98.1 F (36.7 C)  TempSrc: Oral Oral  Oral  Resp: 15 21  22   Height:   5\' 4"  (1.626 m)   Weight:   80.1 kg (176 lb 9.4 oz)   SpO2: 98% 99%  94%    Intake/Output Summary (Last 24 hours) at 04/15/15 1018 Last data filed at 04/15/15 0700  Gross per 24 hour  Intake  736.8 ml  Output    800 ml  Net  -63.2 ml    Telemetry reveals atrial flutter has converted to sinus rhythm  GEN- The patient is tearful (when contemplating her sisters death), alert and oriented x 3 today.   Head- normocephalic, atraumatic Eyes-  Sclera clear, conjunctiva pink Ears- hearing intact Oropharynx- clear Neck- supple,  Lungs- few bibasilar rales, normal work of breathing Heart- irregular rate and rhythm  GI- soft, NT, ND, + BS Extremities- no clubbing, cyanosis,   Skin- no rash or lesion Psych- euthymic mood, full affect Neuro- strength and sensation are intact  LABS: Basic Metabolic Panel:  Recent Labs  04/13/15 2112 04/14/15 0430 04/15/15 0402  NA 140 138 139  K 3.6 3.2* 4.1  CL 105 102 105  CO2 29 27 26   GLUCOSE 187* 139* 170*  BUN 23* 21* 29*  CREATININE 0.84 0.81 1.23*  CALCIUM 8.6* 8.4* 8.8*  MG 2.3 2.2 2.4  PHOS 4.4 4.7*  --    Liver Function Tests:  Recent Labs  04/13/15 1355  AST 25  ALT 24  ALKPHOS 111  BILITOT 1.7*  PROT 6.8  ALBUMIN 3.9   No results for input(s): LIPASE, AMYLASE in the last 72 hours. CBC:  Recent Labs  04/13/15 1355  04/14/15 0430 04/15/15 0402  WBC 8.2  --  6.1 7.0  NEUTROABS 5.8  --   --   --   HGB 12.1  < > 10.8* 11.4*  HCT 39.2  < > 35.4* 37.7  MCV 88.5  --  90.5 89.8  PLT 402*  --  301 333  < > = values in this interval not displayed. Cardiac Enzymes:  Recent Labs  04/13/15 2003 04/13/15 2112 04/14/15 0430  TROPONINI 0.04* 0.07* 0.05*    ASSESSMENT AND PLAN:   1. 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 She is now stable on oral amiodarone.  She should have cath to exclude ischemia, though given that her sister died 11 years ago (05-17-99) suddenly post cath, she is very clear that she is not interested in cath. I have therefore ordered cardiac CT (as an alternative to exclude CAD) for tomorrow am.  I have strongly advised ICD implantation for secondary prevention of sudden death.  At this time, she is clear that she does not wish to have an ICD implanted.  Her concerns are cost (does not have  a secondary insurance), risks, and her sisters death 21 years ago to the day tomorrow. I will have case management consult for assistance with cost concerns. I will make tentatively NPO though she will need to discuss further with Dr Lovena Le in am.  IF she declines ICD implant, then I would anticipate discharge tomorrow with outpatient follow-up with Dr Wynonia Lawman if cardiac CT is ok.  I would be happy to see for EP follow-up also. Given her cost concerns she is not a candidate for lifevest.  She understands that by declining ICD implantation that she is at increased risks for sudden death.  No driving x 6 months.  2. Atrial fibrillation Continue xarelto.  AS she converted from atrial flutter to sinus yesterday, would not hold xarelto for ICD implant  3. Acute on chronic diastolic dysfunction Improved Stop lasix  4. UTI/prior urosepsis Seems to be resolved  5. DM Stable No change required today  Transfer to telemetry Either for ICD  tomorrow or discharge (if she declines) Dr Lovena Le to see in am  Thompson Grayer, MD 04/15/2015 10:18 AM

## 2015-04-15 NOTE — Progress Notes (Signed)
CSW (Clinical Education officer, museum) received consult for medication assistance. Consult more appropriate for nurse case management. Please place consult for this discipline if pt still needs assistance. No social work needs indicated at this time.  [place], LCSWA Weekend CSW [phone]

## 2015-04-16 ENCOUNTER — Encounter (HOSPITAL_COMMUNITY): Payer: Self-pay | Admitting: Radiology

## 2015-04-16 ENCOUNTER — Inpatient Hospital Stay (HOSPITAL_COMMUNITY): Payer: Medicare Other

## 2015-04-16 ENCOUNTER — Encounter (HOSPITAL_COMMUNITY)
Admission: EM | Disposition: A | Discharge status: Home / Self Care | Payer: Self-pay | Source: Home / Self Care | Attending: Internal Medicine

## 2015-04-16 DIAGNOSIS — I4891 Unspecified atrial fibrillation: Secondary | ICD-10-CM

## 2015-04-16 DIAGNOSIS — I472 Ventricular tachycardia: Secondary | ICD-10-CM

## 2015-04-16 HISTORY — PX: EP IMPLANTABLE DEVICE: SHX172B

## 2015-04-16 LAB — GLUCOSE, CAPILLARY
Glucose-Capillary: 132 mg/dL — ABNORMAL HIGH (ref 65–99)
Glucose-Capillary: 193 mg/dL — ABNORMAL HIGH (ref 65–99)

## 2015-04-16 SURGERY — ICD IMPLANT
Anesthesia: LOCAL

## 2015-04-16 MED ORDER — ONDANSETRON HCL 4 MG/2ML IJ SOLN
4.0000 mg | Freq: Four times a day (QID) | INTRAMUSCULAR | Status: DC | PRN
  Administered 2015-04-22: 4 mg via INTRAVENOUS
  Filled 2015-04-16: qty 2

## 2015-04-16 MED ORDER — FENTANYL CITRATE (PF) 100 MCG/2ML IJ SOLN
INTRAMUSCULAR | Status: DC | PRN
  Administered 2015-04-16 (×5): 12.5 ug via INTRAVENOUS

## 2015-04-16 MED ORDER — SODIUM CHLORIDE 0.9 % IV SOLN: INTRAVENOUS | Status: DC

## 2015-04-16 MED ORDER — HEPARIN (PORCINE) IN NACL 2-0.9 UNIT/ML-% IJ SOLN
INTRAMUSCULAR | Status: DC | PRN
  Administered 2015-04-16: 15:00:00

## 2015-04-16 MED ORDER — FUROSEMIDE 10 MG/ML IJ SOLN
INTRAMUSCULAR | Status: AC
  Filled 2015-04-16: qty 4

## 2015-04-16 MED ORDER — OFF THE BEAT BOOK
Freq: Once | Status: AC
  Administered 2015-04-16: 22:00:00
  Filled 2015-04-16: qty 1

## 2015-04-16 MED ORDER — MIDAZOLAM HCL 5 MG/5ML IJ SOLN
INTRAMUSCULAR | Status: AC
  Filled 2015-04-16: qty 25

## 2015-04-16 MED ORDER — YOU HAVE A PACEMAKER BOOK
Freq: Once | Status: AC
  Administered 2015-04-16: 22:00:00
  Filled 2015-04-16: qty 1

## 2015-04-16 MED ORDER — SODIUM CHLORIDE 0.9 % IR SOLN
80.0000 mg | Status: DC
Start: 2015-04-16 — End: 2015-04-16

## 2015-04-16 MED ORDER — FENTANYL CITRATE (PF) 100 MCG/2ML IJ SOLN
INTRAMUSCULAR | Status: AC
  Filled 2015-04-16: qty 4

## 2015-04-16 MED ORDER — LIDOCAINE HCL (PF) 1 % IJ SOLN
INTRAMUSCULAR | Status: DC | PRN
  Administered 2015-04-16: 50 mL via SUBCUTANEOUS

## 2015-04-16 MED ORDER — FUROSEMIDE 10 MG/ML IJ SOLN
40.0000 mg | Freq: Once | INTRAMUSCULAR | Status: AC
  Administered 2015-04-16: 40 mg via INTRAVENOUS
  Filled 2015-04-16: qty 4

## 2015-04-16 MED ORDER — SODIUM CHLORIDE 0.9 % IR SOLN
Status: DC | PRN
  Administered 2015-04-16: 16:00:00

## 2015-04-16 MED ORDER — IOHEXOL 350 MG/ML SOLN
80.0000 mL | Freq: Once | INTRAVENOUS | Status: AC | PRN
  Administered 2015-04-16: 80 mL via INTRAVENOUS

## 2015-04-16 MED ORDER — LIDOCAINE HCL (PF) 1 % IJ SOLN
INTRAMUSCULAR | Status: AC
Start: 2015-04-16 — End: 2015-04-16
  Filled 2015-04-16: qty 60

## 2015-04-16 MED ORDER — NITROGLYCERIN 0.4 MG SL SUBL
0.4000 mg | SUBLINGUAL_TABLET | Freq: Once | SUBLINGUAL | Status: AC
  Administered 2015-04-16: 0.4 mg via SUBLINGUAL

## 2015-04-16 MED ORDER — ACETAMINOPHEN 325 MG PO TABS: 325.0000 mg | ORAL_TABLET | ORAL | Status: DC | PRN

## 2015-04-16 MED ORDER — FUROSEMIDE 40 MG PO TABS
40.0000 mg | ORAL_TABLET | Freq: Every day | ORAL | Status: DC
  Administered 2015-04-16 – 2015-04-18 (×2): 40 mg via ORAL
  Filled 2015-04-16 (×3): qty 1

## 2015-04-16 MED ORDER — CEFAZOLIN SODIUM-DEXTROSE 2-3 GM-% IV SOLR
INTRAVENOUS | Status: AC
  Filled 2015-04-16: qty 50

## 2015-04-16 MED ORDER — CEFAZOLIN SODIUM-DEXTROSE 2-3 GM-% IV SOLR: 2.0000 g | INTRAVENOUS | Status: DC

## 2015-04-16 MED ORDER — CEFAZOLIN SODIUM 1-5 GM-% IV SOLN
1.0000 g | Freq: Four times a day (QID) | INTRAVENOUS | Status: AC
  Administered 2015-04-16 – 2015-04-17 (×3): 1 g via INTRAVENOUS
  Filled 2015-04-16 (×3): qty 50

## 2015-04-16 MED ORDER — RIVAROXABAN 20 MG PO TABS: 20.0000 mg | ORAL_TABLET | Freq: Every day | ORAL | Status: DC

## 2015-04-16 MED ORDER — SODIUM CHLORIDE 0.9 % IR SOLN
Status: AC
  Filled 2015-04-16: qty 2

## 2015-04-16 MED ORDER — FUROSEMIDE 10 MG/ML IJ SOLN
INTRAMUSCULAR | Status: DC | PRN
  Administered 2015-04-16: 40 mg via INTRAVENOUS

## 2015-04-16 MED ORDER — MIDAZOLAM HCL 5 MG/5ML IJ SOLN
INTRAMUSCULAR | Status: DC | PRN
  Administered 2015-04-16 (×5): 1 mg via INTRAVENOUS

## 2015-04-16 MED ORDER — NITROGLYCERIN 0.4 MG SL SUBL
SUBLINGUAL_TABLET | SUBLINGUAL | Status: AC
  Filled 2015-04-16: qty 1

## 2015-04-16 MED ORDER — CEFAZOLIN SODIUM-DEXTROSE 2-3 GM-% IV SOLR
INTRAVENOUS | Status: DC | PRN
  Administered 2015-04-16: 2 g via INTRAVENOUS

## 2015-04-16 MED ORDER — METOPROLOL SUCCINATE ER 25 MG PO TB24
37.5000 mg | ORAL_TABLET | Freq: Two times a day (BID) | ORAL | Status: DC
  Administered 2015-04-16 – 2015-04-20 (×9): 37.5 mg via ORAL
  Filled 2015-04-16 (×9): qty 2
  Filled 2015-04-16: qty 1.5

## 2015-04-16 SURGICAL SUPPLY — 7 items
CABLE SURGICAL S-101-97-12 (CABLE) ×1 IMPLANT
ICD ELLIPSE DR CD2411-36C (ICD Generator) ×1 IMPLANT
LEAD DURATA 7122-60CM (Lead) ×1 IMPLANT
LEAD TENDRIL SDX 1688TC-52CM (Lead) ×2 IMPLANT
PAD DEFIB LIFELINK (PAD) ×1 IMPLANT
SHEATH CLASSIC 7F (SHEATH) ×2 IMPLANT
TRAY PACEMAKER INSERTION (CUSTOM PROCEDURE TRAY) ×1 IMPLANT

## 2015-04-16 NOTE — Progress Notes (Signed)
Patient ID: [name], female   DOB: Dec 05, 1945, 69 y.o.   MRN: [id number]    Patient Name: [name] Date of Encounter: 04/16/2015     Active Problems:   Ventricular tachycardia   Acute respiratory failure    SUBJECTIVE  No chest pain or sob. Thinks her belly a bit distended.   CURRENT MEDS . amiodarone  200 mg Oral BID  . pneumococcal 23 valent vaccine  0.5 mL Intramuscular Tomorrow-1000  . potassium chloride  20 mEq Oral Daily  . rivaroxaban  20 mg Oral Q supper    OBJECTIVE  Filed Vitals:   04/16/15 0500 04/16/15 0600 04/16/15 0700 04/16/15 0717  BP:    110/42  Pulse: 62 62 62 64  Temp:    97.4 F (36.3 C)  TempSrc:    Oral  Resp: 23 27 32 26  Height:      Weight: 178 lb 2.1 oz (80.8 kg)     SpO2: 96% 96% 97% 94%    Intake/Output Summary (Last 24 hours) at 04/16/15 0808 Last data filed at 04/16/15 0500  Gross per 24 hour  Intake    390 ml  Output    900 ml  Net   -510 ml   Filed Weights   04/14/15 0500 04/15/15 0500 04/16/15 0500  Weight: 175 lb 4.3 oz (79.5 kg) 176 lb 9.4 oz (80.1 kg) 178 lb 2.1 oz (80.8 kg)    PHYSICAL EXAM  General: Pleasant, anxious, NAD. Neuro: Alert and oriented X 3. Moves all extremities spontaneously. Psych: Normal affect. HEENT:  Normal  Neck: Supple without bruits or JVD. Lungs:  Resp regular and unlabored, CTA. Heart: RRR no s3, s4, or murmurs. Abdomen: Soft, non-tender, non-distended, BS + x 4.  Extremities: No clubbing, cyanosis or edema. DP/PT/Radials 2+ and equal bilaterally.  Accessory Clinical Findings  CBC  Recent Labs  04/13/15 1355  04/14/15 0430 04/15/15 0402  WBC 8.2  --  6.1 7.0  NEUTROABS 5.8  --   --   --   HGB 12.1  < > 10.8* 11.4*  HCT 39.2  < > 35.4* 37.7  MCV 88.5  --  90.5 89.8  PLT 402*  --  301 333  < > = values in this interval not displayed. Basic Metabolic Panel  Recent Labs  04/13/15 2112 04/14/15 0430 04/15/15 0402  NA 140 138 139  K 3.6 3.2* 4.1  CL 105 102  105  CO2 29 27 26   GLUCOSE 187* 139* 170*  BUN 23* 21* 29*  CREATININE 0.84 0.81 1.23*  CALCIUM 8.6* 8.4* 8.8*  MG 2.3 2.2 2.4  PHOS 4.4 4.7*  --    Liver Function Tests  Recent Labs  04/13/15 1355  AST 25  ALT 24  ALKPHOS 111  BILITOT 1.7*  PROT 6.8  ALBUMIN 3.9   No results for input(s): LIPASE, AMYLASE in the last 72 hours. Cardiac Enzymes  Recent Labs  04/13/15 2003 04/13/15 2112 04/14/15 0430  TROPONINI 0.04* 0.07* 0.05*   BNP Invalid input(s): POCBNP D-Dimer No results for input(s): DDIMER in the last 72 hours. Hemoglobin A1C No results for input(s): HGBA1C in the last 72 hours. Fasting Lipid Panel No results for input(s): CHOL, HDL, LDLCALC, TRIG, CHOLHDL, LDLDIRECT in the last 72 hours. Thyroid Function Tests No results for input(s): TSH, T4TOTAL, T3FREE, THYROIDAB in the last 72 hours.  Invalid input(s): FREET3  TELE  nsr  Radiology/Studies  Dg Chest Port 1 View  04/14/2015  CLINICAL DATA:  Acute respiratory failure.  Diabetes mellitus.  EXAM: PORTABLE CHEST - 1 VIEW  COMPARISON:  04/13/2015.  FINDINGS: Cardiomegaly persists. BILATERAL pulmonary edema is redemonstrated, perhaps slightly improved with regard interstitial prominence. Large LEFT pleural effusion is now observed. Overlying telemetry leads.  IMPRESSION: Slight improvement in pulmonary edema, with increasing LEFT pleural effusion. Cardiomegaly.   Electronically Signed   By: Staci Righter M.D.   On: 04/14/2015 10:26   Dg Chest Port 1 View  04/13/2015   CLINICAL DATA:  Chest pain  EXAM: PORTABLE CHEST - 1 VIEW  COMPARISON:  03/31/2015  FINDINGS: Cardiac enlargement. Diffuse bilateral airspace disease has progressed since the prior study most consistent with pulmonary edema. Small bilateral pleural effusions with bibasilar atelectasis.  IMPRESSION: Congestive heart failure with bilateral edema and small pleural effusions. Worsening of edema since the prior study.   Electronically Signed   By:  Franchot Gallo M.D.   On: 04/13/2015 14:02   Dg Chest Port 1 View  03/31/2015   CLINICAL DATA:  Weakness and shortness of breath. Near syncope. Bilateral lower extremity swelling.  EXAM: PORTABLE CHEST - 1 VIEW  COMPARISON:  01/01/2013  FINDINGS: Cardiomegaly is again seen. There is progressive interstitial and perihilar opacities concerning for pulmonary edema. Mild atelectasis at the right lung base. No confluent airspace disease. Questionable blunting of left costophrenic angle. No pneumothorax.  IMPRESSION: Findings suspicious for CHF.   Electronically Signed   By: Jeb Levering M.D.   On: 03/31/2015 01:21    ASSESSMENT AND PLAN  1. VT storm 2. HCM 3. Chest pain  4. Acute on chronic renal insufficiency Rec: Note plans for CT scan to look for obstructive CAD. I have discussed the indications/risk/benefits/goals of ICD Implant and she wishes to proceed. With need for anti-arrhythmic drug therapy, and subsequent bradycardia, I would anticipate placement of a DDD ICD.  Gregg Taylor,M.D.  04/16/2015 8:08 AM

## 2015-04-16 NOTE — H&P (Signed)
  ICD Criteria  Current LVEF:55%. Within 12 months prior to implant: Yes   Heart failure history: Yes, Class II  Cardiomyopathy history: Yes, Non-Ischemic Cardiomyopathy.  Atrial Fibrillation/Atrial Flutter: Yes, Paroxysmal.  Ventricular tachycardia history: Yes, Hemodynamic instability present. VT Type: Sustained Ventricular Tachycardia - Monomorphic.  Cardiac arrest history: No.  History of syndromes with risk of sudden death: Yes, Other  Previous ICD: No.  Current ICD indication: Secondary  PPM indication: No.   Class I or II Bradycardia indication present: No  Beta Blocker therapy for 3 or more months: Yes, prescribed.   Ace Inhibitor/ARB therapy for 3 or more months: No, medical reason.

## 2015-04-16 NOTE — Care Management Important Message (Signed)
Important Message  Patient Details  Name: [name] MRN: [id number] Date of Birth: 07-Mar-1946   Medicare Important Message Given:  Yes-second notification given    [name], [name] 04/16/2015, 12:57 PM

## 2015-04-17 ENCOUNTER — Encounter (HOSPITAL_COMMUNITY): Payer: Self-pay | Admitting: Internal Medicine

## 2015-04-17 ENCOUNTER — Inpatient Hospital Stay (HOSPITAL_COMMUNITY): Payer: Medicare Other

## 2015-04-17 ENCOUNTER — Telehealth: Payer: Self-pay | Admitting: *Deleted

## 2015-04-17 DIAGNOSIS — I472 Ventricular tachycardia: Secondary | ICD-10-CM | POA: Diagnosis not present

## 2015-04-17 DIAGNOSIS — R0602 Shortness of breath: Secondary | ICD-10-CM

## 2015-04-17 LAB — NM MYOCAR MULTI W/SPECT W/WALL MOTION / EF
CHL CUP NUCLEAR SDS: 14
CHL CUP STRESS STAGE 1 HR: 93 {beats}/min
CHL CUP STRESS STAGE 3 GRADE: 0 %
CHL CUP STRESS STAGE 3 HR: 100 {beats}/min
CHL CUP STRESS STAGE 3 SBP: 110 mmHg
CHL CUP STRESS STAGE 3 SPEED: 0 mph
CHL CUP STRESS STAGE 4 HR: 98 {beats}/min
CHL CUP STRESS STAGE 4 SBP: 116 mmHg
CHL CUP STRESS STAGE 4 SPEED: 0 mph
CHL CUP STRESS STAGE 5 DBP: 78 mmHg
CHL CUP STRESS STAGE 5 GRADE: 0 %
CHL CUP STRESS STAGE 5 HR: 108 {beats}/min
CHL CUP STRESS STAGE 6 DBP: 78 mmHg
CHL CUP STRESS STAGE 6 SBP: 120 mmHg
CHL CUP STRESS STAGE 7 DBP: 81 mmHg
CSEPPHR: 104 {beats}/min
CSEPPMHR: 67 %
Estimated workload: 1 METS
LV dias vol: 89 mL
LVSYSVOL: 58 mL
Peak BP: 126 mmHg
RATE: 0
SRS: 14
SSS: 28
Stage 1 DBP: 80 mmHg
Stage 1 Grade: 0 %
Stage 1 SBP: 145 mmHg
Stage 1 Speed: 0 mph
Stage 2 Grade: 0 %
Stage 2 HR: 97 {beats}/min
Stage 2 Speed: 0 mph
Stage 3 DBP: 72 mmHg
Stage 4 DBP: 74 mmHg
Stage 4 Grade: 0 %
Stage 5 SBP: 119 mmHg
Stage 5 Speed: 0 mph
Stage 6 Grade: 0 %
Stage 6 HR: 100 {beats}/min
Stage 6 Speed: 0 mph
Stage 7 Grade: 0 %
Stage 7 HR: 104 {beats}/min
Stage 7 SBP: 126 mmHg
Stage 7 Speed: 0 mph
TID: 0.94

## 2015-04-17 LAB — BASIC METABOLIC PANEL
Anion gap: 9 (ref 5–15)
BUN: 24 mg/dL — AB (ref 6–20)
CHLORIDE: 102 mmol/L (ref 101–111)
CO2: 28 mmol/L (ref 22–32)
CREATININE: 0.87 mg/dL (ref 0.44–1.00)
Calcium: 8.9 mg/dL (ref 8.9–10.3)
GFR calc non Af Amer: >60 mL/min (ref >60)
Glucose, Bld: 177 mg/dL — ABNORMAL HIGH (ref 65–99)
Potassium: 4.1 mmol/L (ref 3.5–5.1)
Sodium: 139 mmol/L (ref 135–145)

## 2015-04-17 MED ORDER — SODIUM CHLORIDE 0.9 % IJ SOLN
80.0000 mg | INTRAVENOUS | Status: AC
  Administered 2015-04-17: 80 mg via INTRAVENOUS

## 2015-04-17 MED ORDER — FUROSEMIDE 10 MG/ML IJ SOLN
40.0000 mg | Freq: Once | INTRAMUSCULAR | Status: AC
  Administered 2015-04-17: 40 mg via INTRAVENOUS
  Filled 2015-04-17: qty 4

## 2015-04-17 MED ORDER — TECHNETIUM TC 99M SESTAMIBI GENERIC - CARDIOLITE
10.0000 | Freq: Once | INTRAVENOUS | Status: AC | PRN
  Administered 2015-04-17: 10:00:00 10 via INTRAVENOUS

## 2015-04-17 MED ORDER — REGADENOSON 0.4 MG/5ML IV SOLN
0.4000 mg | Freq: Once | INTRAVENOUS | Status: AC
  Administered 2015-04-17: 0.4 mg via INTRAVENOUS
  Filled 2015-04-17: qty 5

## 2015-04-17 MED ORDER — TRAZODONE HCL 50 MG PO TABS
50.0000 mg | ORAL_TABLET | Freq: Every evening | ORAL | Status: DC | PRN
  Administered 2015-04-17 – 2015-04-21 (×4): 50 mg via ORAL
  Filled 2015-04-17 (×4): qty 1

## 2015-04-17 MED ORDER — TECHNETIUM TC 99M SESTAMIBI GENERIC - CARDIOLITE
30.0000 | Freq: Once | INTRAVENOUS | Status: AC | PRN
  Administered 2015-04-17: 30 via INTRAVENOUS

## 2015-04-17 MED ORDER — REGADENOSON 0.4 MG/5ML IV SOLN
INTRAVENOUS | Status: AC
  Administered 2015-04-17: 11:00:00 0.4 mg via INTRAVENOUS
  Filled 2015-04-17: qty 5

## 2015-04-17 NOTE — Telephone Encounter (Signed)
-----   Message from Patsey Berthold, NP sent at 04/16/2015  7:56 PM EDT ----- She will be a transition of care visit when she sees Dayna next week. Please do phone call.  Anticipate DC 9/13.  Thank you!

## 2015-04-17 NOTE — Progress Notes (Signed)
SUBJECTIVE: The patient is doing well today.  At this time, she denies chest pain, shortness of breath, or any new concerns.  She is anxious to go home and is worried about her stress test today.  CURRENT MEDICATIONS: . amiodarone  200 mg Oral BID  .  ceFAZolin (ANCEF) IV  1 g Intravenous Q6H  . furosemide  40 mg Oral Daily  . metoprolol succinate  37.5 mg Oral BID  . pneumococcal 23 valent vaccine  0.5 mL Intramuscular Tomorrow-1000  . potassium chloride  20 mEq Oral Daily  . rivaroxaban  20 mg Oral Q supper      OBJECTIVE: Physical Exam: Filed Vitals:   04/16/15 1830 04/16/15 1900 04/17/15 0308 04/17/15 0648  BP: 154/106 151/62 138/56   Pulse: 61 61    Temp:   98.6 F (37 C)   TempSrc:   Oral   Resp: 21 27    Height:      Weight:    169 lb 5 oz (76.8 kg)  SpO2: 92% 92% 94%     Intake/Output Summary (Last 24 hours) at 04/17/15 3244 Last data filed at 04/17/15 0303  Gross per 24 hour  Intake  357.5 ml  Output   1750 ml  Net -1392.5 ml    Telemetry reveals sinus rhythm  GEN- The patient is well appearing, alert and oriented x 3 today.   Head- normocephalic, atraumatic Eyes-  Sclera clear, conjunctiva pink Ears- hearing intact Oropharynx- clear Neck- supple  Lungs- Clear to ausculation bilaterally, normal work of breathing Heart- Regular rate and rhythm  GI- soft, NT, ND, + BS Extremities- no clubbing, cyanosis, or edema Skin- no rash or lesion Psych- anxious mood, full affect Neuro- strength and sensation are intact  LABS: Basic Metabolic Panel:  Recent Labs  04/15/15 0402 04/17/15 0354  NA 139 139  K 4.1 4.1  CL 105 102  CO2 26 28  GLUCOSE 170* 177*  BUN 29* 24*  CREATININE 1.23* 0.87  CALCIUM 8.8* 8.9  MG 2.4  --    CBC:  Recent Labs  04/15/15 0402  WBC 7.0  HGB 11.4*  HCT 37.7  MCV 89.8  PLT 333    RADIOLOGY: Ct Coronary Morp W/cta Cor W/score W/ca W/cm &/or Wo/cm 04/16/2015   ADDENDUM REPORT: 04/16/2015 17:56  CLINICAL DATA:   69 year old female with hypertrophic cardiomyopathy who presented with VT storm. Evaluate for obstructive CAD.  EXAM: Cardiac/Coronary  CT  TECHNIQUE: The patient was scanned on a Philips 256 scanner.  FINDINGS: A 120 kV prospective scan was triggered in the descending thoracic aorta at 111 HU's. Axial non-contrast 3 mm slices were carried out through the heart. The data set was analyzed on a dedicated work station and scored using the [place]. Gantry rotation speed was 270 msecs and collimation was .9 mm. No beta blockade and 0.4 mg of sl NTG was given. The 3D data set was reconstructed in 5% intervals of the 67-82 % of the R-R cycle. Diastolic phases were analyzed on a dedicated work station using MPR, MIP and VRT modes. The patient received 80 cc of contrast.  Aorta:  Normal caliber, mild diffuse calcifications.  No dissection.  Aortic Valve:  Trileaflet.  Minimal calcifications of the leaflets.  Coronary Arteries:  Normal origin.  Right dominance.  The quality is affected by significant ectopy during acquisition resulting in motion.  RCA is a large dominant vessel. There appears to be diffuse mixed plaque but no severe stenosis.  Left main is a long and large vessel that has minimal calcified plaque in its distal portion associated with 0-25% stenosis.  LAD is a large vessel that has severe diffuse calcified plaque predominantly in the proximal and mid segment. There is moderate calcified lesion in the proximal segment associated with 50-69% stenosis. In the mid LAD there are two segments of mixed, predominantly calcified plaque with possible stenosis > stenosis. The more accurate estimate not possible secondary to motion artifact.  There is a diagonal branch originating of mid LAD with possible severe stenosis in the proximal segment.  A small ramus intermedius has mild calcified plaque in the proximal segment.  LCX artery is medium caliber vessel that gives rise to one small obtuse marginal branch. A  minimal calcified plaque associated with 0-25% stenosis is present in the proximal segment.  IMPRESSION: 1. Coronary calcium score of 498. 416 is located in the proximal to mid LAD. This was 75 percentile for age and sex matched control.  2. The quality of the study is affected by significant ectopy and motion.  3. Normal coronary origin with right dominance.  4. There is moderate diffuse mixed, predominantly calcified plaque in LAD.  5. There is suspicion for severe stenosis in the mid LAD and in the first diagonal branch. Because the study quality is affected by ectopy and motion a functional study with a stress test is recommended to evaluate for ischemia in this territory.  Ena Dawley   Electronically Signed   By: Ena Dawley   On: 04/16/2015 17:56   ASSESSMENT AND PLAN:  Active Problems:   Ventricular tachycardia   Acute respiratory failure  1.  Ventricular tachycardia S/p ICD 04/16/15 for secondary prevention Continue Amiodarone 200mg  bid until seen in office for follow up Keep K >3.9, Mg >1.8 No driving x6 months (pt aware)  2.  Abnormal coronary CT Dr Lovena Le discussed with Dr Meda Coffee - for [facility] today  2.  Paroxysmal atrial fibrillation Continue Xarelto for CHADS2VASC of at least 3 Currently in SR  3.  Hypoxia Still requiring O2 Will mobilize today  4.  Acute on chronic diastolic dysfunction Euvolemic on exam  If myoview low risk, could discharge home later today. Will need TOC visit in 10-14 days (scheduled).   Chanetta Marshall, NP 04/17/2015 8:16 AM   EP Attending  Patient seen and examined. Agree with above. In light of her fluid overload on CXR, would diurese and hold off on discharge regardless of myoview results.  Mikle Bosworth.D.

## 2015-04-17 NOTE — Progress Notes (Addendum)
Lexiscan MV performed. 1 day study, [place] to read.  Rosaria Ferries, Hershal Coria 04/17/2015 10:51 AM Beeper [phone]

## 2015-04-17 NOTE — Telephone Encounter (Signed)
-----   Message from Kayla Berthold, NP sent at 04/16/2015  7:56 PM EDT ----- She will be a transition of care visit when she sees Dayna next week. Please do phone call.  Anticipate DC 9/13.  Thank you!

## 2015-04-18 LAB — BASIC METABOLIC PANEL
Anion gap: 7 (ref 5–15)
BUN: 22 mg/dL — AB (ref 6–20)
CHLORIDE: 103 mmol/L (ref 101–111)
CO2: 30 mmol/L (ref 22–32)
CREATININE: 0.79 mg/dL (ref 0.44–1.00)
Calcium: 8.8 mg/dL — ABNORMAL LOW (ref 8.9–10.3)
GFR calc Af Amer: >60 mL/min (ref >60)
GFR calc non Af Amer: >60 mL/min (ref >60)
GLUCOSE: 172 mg/dL — AB (ref 65–99)
Potassium: 3.8 mmol/L (ref 3.5–5.1)
Sodium: 140 mmol/L (ref 135–145)

## 2015-04-18 MED ORDER — HYDROCORTISONE 1 % EX CREA
1.0000 "application " | TOPICAL_CREAM | Freq: Three times a day (TID) | CUTANEOUS | Status: DC | PRN
  Filled 2015-04-18: qty 28

## 2015-04-18 MED ORDER — SODIUM CHLORIDE 0.9 % WEIGHT BASED INFUSION
1.0000 mL/kg/h | INTRAVENOUS | Status: DC
  Administered 2015-04-19 (×2): 1 mL/kg/h via INTRAVENOUS

## 2015-04-18 MED ORDER — LOPERAMIDE HCL 2 MG PO CAPS
2.0000 mg | ORAL_CAPSULE | ORAL | Status: DC | PRN
Start: 2015-04-18 — End: 2015-04-23

## 2015-04-18 MED ORDER — AMIODARONE HCL 200 MG PO TABS
200.0000 mg | ORAL_TABLET | Freq: Two times a day (BID) | ORAL | Status: DC
  Administered 2015-04-19 – 2015-04-23 (×8): 200 mg via ORAL
  Filled 2015-04-18 (×9): qty 1

## 2015-04-18 MED ORDER — SODIUM CHLORIDE 0.9 % IJ SOLN: 3.0000 mL | INTRAMUSCULAR | Status: DC | PRN

## 2015-04-18 MED ORDER — SODIUM CHLORIDE 0.9 % IV SOLN: 250.0000 mL | INTRAVENOUS | Status: DC | PRN

## 2015-04-18 MED ORDER — ALUM & MAG HYDROXIDE-SIMETH 200-200-20 MG/5ML PO SUSP
30.0000 mL | ORAL | Status: DC | PRN
Start: 2015-04-18 — End: 2015-04-23

## 2015-04-18 MED ORDER — SODIUM CHLORIDE 0.9 % WEIGHT BASED INFUSION
3.0000 mL/kg/h | INTRAVENOUS | Status: DC
  Administered 2015-04-19: 06:00:00 3 mL/kg/h via INTRAVENOUS

## 2015-04-18 MED ORDER — MAGNESIUM HYDROXIDE 400 MG/5ML PO SUSP
30.0000 mL | Freq: Every day | ORAL | Status: DC | PRN
  Filled 2015-04-18 (×2): qty 30

## 2015-04-18 MED ORDER — AMIODARONE HCL 200 MG PO TABS
200.0000 mg | ORAL_TABLET | Freq: Two times a day (BID) | ORAL | Status: AC
  Administered 2015-04-18 – 2015-04-19 (×2): 200 mg via ORAL
  Filled 2015-04-18: qty 1

## 2015-04-18 MED ORDER — PRAMOXINE-ZINC OXIDE IN MO 1-12.5 % RE OINT
1.0000 "application " | TOPICAL_OINTMENT | Freq: Three times a day (TID) | RECTAL | Status: DC | PRN
  Filled 2015-04-18: qty 28.3

## 2015-04-18 MED ORDER — ASPIRIN 81 MG PO CHEW
81.0000 mg | CHEWABLE_TABLET | ORAL | Status: AC
  Administered 2015-04-19: 06:00:00 81 mg via ORAL
  Filled 2015-04-18: qty 1

## 2015-04-18 MED ORDER — GUAIFENESIN-DM 100-10 MG/5ML PO SYRP: 15.0000 mL | ORAL_SOLUTION | ORAL | Status: DC | PRN

## 2015-04-18 MED ORDER — SODIUM CHLORIDE 0.9 % IJ SOLN
3.0000 mL | Freq: Two times a day (BID) | INTRAMUSCULAR | Status: DC
  Administered 2015-04-18: 3 mL via INTRAVENOUS

## 2015-04-18 MED ORDER — WITCH HAZEL-GLYCERIN EX PADS
MEDICATED_PAD | Freq: Three times a day (TID) | CUTANEOUS | Status: DC | PRN
  Filled 2015-04-18: qty 100

## 2015-04-18 NOTE — Progress Notes (Signed)
Patient went into Afib with V paced complex heart rate in the 90's and BP 115/65 denies any pain or complications.  MD notified will continue to monitor patient already on Xeralto at this time. Patient instructed to call me for if she feels any changes.

## 2015-04-18 NOTE — Progress Notes (Addendum)
SUBJECTIVE: The patient is doing well today.  At this time, she denies chest pain, shortness of breath, or any new concerns.    CURRENT MEDICATIONS: . amiodarone  200 mg Oral BID  . furosemide  40 mg Oral Daily  . metoprolol succinate  37.5 mg Oral BID  . potassium chloride  20 mEq Oral Daily  . rivaroxaban  20 mg Oral Q supper      OBJECTIVE: Physical Exam: Filed Vitals:   04/17/15 1645 04/17/15 2031 04/18/15 0347 04/18/15 0528  BP: 144/65 156/72 115/65   Pulse:  68 87   Temp:  97.5 F (36.4 C) 97.8 F (36.6 C)   TempSrc:  Oral Oral   Resp:  18 18   Height:      Weight:    184 lb 4.9 oz (83.6 kg)  SpO2:  92% 95%     Intake/Output Summary (Last 24 hours) at 04/18/15 0731 Last data filed at 04/18/15 0237  Gross per 24 hour  Intake    720 ml  Output   1500 ml  Net   -780 ml    Telemetry reveals sinus rhythm, atrial fibrillation last night  GEN- The patient is well appearing, alert and oriented x 3 today.   Head- normocephalic, atraumatic Eyes-  Sclera clear, conjunctiva pink Ears- hearing intact Oropharynx- clear Neck- supple  Lungs- Clear to ausculation bilaterally, normal work of breathing Heart- Regular rate and rhythm  GI- soft, NT, ND, + BS Extremities- no clubbing, cyanosis, or edema Skin- no rash or lesion Psych- anxious mood, full affect Neuro- strength and sensation are intact  LABS: Basic Metabolic Panel:  Recent Labs  04/17/15 0354 04/18/15 0333  NA 139 140  K 4.1 3.8  CL 102 103  CO2 28 30  GLUCOSE 177* 172*  BUN 24* 22*  CREATININE 0.87 0.79  CALCIUM 8.9 8.8*    RADIOLOGY: Ct Coronary Morp W/cta Cor W/score W/ca W/cm &/or Wo/cm 04/16/2015   ADDENDUM REPORT: 04/16/2015 17:56  CLINICAL DATA:  69 year old female with hypertrophic cardiomyopathy who presented with VT storm. Evaluate for obstructive CAD.  EXAM: Cardiac/Coronary  CT  TECHNIQUE: The patient was scanned on a Philips 256 scanner.  FINDINGS: A 120 kV prospective scan was  triggered in the descending thoracic aorta at 111 HU's. Axial non-contrast 3 mm slices were carried out through the heart. The data set was analyzed on a dedicated work station and scored using the [place]. Gantry rotation speed was 270 msecs and collimation was .9 mm. No beta blockade and 0.4 mg of sl NTG was given. The 3D data set was reconstructed in 5% intervals of the 67-82 % of the R-R cycle. Diastolic phases were analyzed on a dedicated work station using MPR, MIP and VRT modes. The patient received 80 cc of contrast.  Aorta:  Normal caliber, mild diffuse calcifications.  No dissection.  Aortic Valve:  Trileaflet.  Minimal calcifications of the leaflets.  Coronary Arteries:  Normal origin.  Right dominance.  The quality is affected by significant ectopy during acquisition resulting in motion.  RCA is a large dominant vessel. There appears to be diffuse mixed plaque but no severe stenosis.  Left main is a long and large vessel that has minimal calcified plaque in its distal portion associated with 0-25% stenosis.  LAD is a large vessel that has severe diffuse calcified plaque predominantly in the proximal and mid segment. There is moderate calcified lesion in the proximal segment associated with 50-69% stenosis. In  the mid LAD there are two segments of mixed, predominantly calcified plaque with possible stenosis > stenosis. The more accurate estimate not possible secondary to motion artifact.  There is a diagonal branch originating of mid LAD with possible severe stenosis in the proximal segment.  A small ramus intermedius has mild calcified plaque in the proximal segment.  LCX artery is medium caliber vessel that gives rise to one small obtuse marginal branch. A minimal calcified plaque associated with 0-25% stenosis is present in the proximal segment.  IMPRESSION: 1. Coronary calcium score of 498. 416 is located in the proximal to mid LAD. This was 38 percentile for age and sex matched control.  2.  The quality of the study is affected by significant ectopy and motion.  3. Normal coronary origin with right dominance.  4. There is moderate diffuse mixed, predominantly calcified plaque in LAD.  5. There is suspicion for severe stenosis in the mid LAD and in the first diagonal branch. Because the study quality is affected by ectopy and motion a functional study with a stress test is recommended to evaluate for ischemia in this territory.  Ena Dawley   Electronically Signed   By: Ena Dawley   On: 04/16/2015 17:56   ASSESSMENT AND PLAN:  Active Problems:   Ventricular tachycardia   Acute respiratory failure  1.  Ventricular tachycardia S/p ICD 04/16/15 for secondary prevention Continue Amiodarone 200mg  bid until seen in office for follow up Keep K >3.9, Mg >1.8 No driving x6 months (pt aware)  2.  Abnormal coronary CT/myoview Dr Lovena Le discussed with patient today, recommend cardiac catheterization today. Risks, benefits reviewed with the patient by Dr Lovena Le today who wishes to proceed. She took Xarelto last night, will hold tonight and plan cath in the morning.   2.  Paroxysmal atrial fibrillation Continue Xarelto for CHADS2VASC of at least 3 An episode of rate controlled AF overnight, now in SR  3.  Hypoxia Significantly improved with diuresis  4.  Acute on chronic diastolic dysfunction Euvolemic on exam    Chanetta Marshall, NP 04/18/2015 7:31 AM    EP Attending  Patient seen and examined. Agree with above. Will plan to proceed with left heart cath due to high risk perfusion scan. Cannot go today because of xarelto.   Mikle Bosworth.D.

## 2015-04-18 NOTE — Telephone Encounter (Signed)
Pt is currently still admitted to [facility] per MD progress note.  Will leave this on triage desktop to follow-up with the pt as a TCM call once discharged from the hospital.

## 2015-04-19 ENCOUNTER — Inpatient Hospital Stay (HOSPITAL_COMMUNITY): Payer: Medicare Other

## 2015-04-19 ENCOUNTER — Encounter (HOSPITAL_COMMUNITY)
Admission: EM | Disposition: A | Discharge status: Home / Self Care | Payer: Self-pay | Source: Home / Self Care | Attending: Internal Medicine

## 2015-04-19 ENCOUNTER — Encounter (HOSPITAL_COMMUNITY): Payer: Self-pay | Admitting: Interventional Cardiology

## 2015-04-19 ENCOUNTER — Other Ambulatory Visit (HOSPITAL_COMMUNITY): Payer: Medicare Other

## 2015-04-19 ENCOUNTER — Other Ambulatory Visit: Payer: Self-pay | Admitting: *Deleted

## 2015-04-19 DIAGNOSIS — I2510 Atherosclerotic heart disease of native coronary artery without angina pectoris: Secondary | ICD-10-CM

## 2015-04-19 DIAGNOSIS — I25110 Atherosclerotic heart disease of native coronary artery with unstable angina pectoris: Secondary | ICD-10-CM

## 2015-04-19 HISTORY — PX: CARDIAC CATHETERIZATION: SHX172

## 2015-04-19 LAB — BASIC METABOLIC PANEL WITH GFR
Anion gap: 8 (ref 5–15)
BUN: 25 mg/dL — ABNORMAL HIGH (ref 6–20)
CO2: 28 mmol/L (ref 22–32)
Calcium: 8.7 mg/dL — ABNORMAL LOW (ref 8.9–10.3)
Chloride: 104 mmol/L (ref 101–111)
Creatinine, Ser: 0.79 mg/dL (ref 0.44–1.00)
GFR calc Af Amer: >60 mL/min (ref >60)
GFR calc non Af Amer: >60 mL/min (ref >60)
Glucose, Bld: 199 mg/dL — ABNORMAL HIGH (ref 65–99)
Potassium: 3.7 mmol/L (ref 3.5–5.1)
Sodium: 140 mmol/L (ref 135–145)

## 2015-04-19 LAB — PULMONARY FUNCTION TEST
DL/VA % pred: 113 %
DL/VA: 5.44 ml/min/mmHg/L
DLCO UNC % PRED: 67 %
DLCO UNC: 16.43 ml/min/mmHg
DLCO cor % pred: 72 %
DLCO cor: 17.61 ml/min/mmHg
FEF 25-75 PRE: 0.65 L/s
FEF 25-75 Post: 0.5 L/sec
FEF2575-%Change-Post: -23 %
FEF2575-%Pred-Post: 24 %
FEF2575-%Pred-Pre: 32 %
FEV1-%Change-Post: -4 %
FEV1-%PRED-POST: 35 %
FEV1-%PRED-PRE: 37 %
FEV1-PRE: 0.87 L
FEV1-Post: 0.83 L
FEV1FVC-%Change-Post: 16 %
FEV1FVC-%Pred-Pre: 99 %
FEV6-%CHANGE-POST: -16 %
FEV6-%PRED-POST: 31 %
FEV6-%PRED-PRE: 38 %
FEV6-POST: 0.94 L
FEV6-PRE: 1.13 L
FEV6FVC-%CHANGE-POST: 1 %
FEV6FVC-%PRED-POST: 104 %
FEV6FVC-%PRED-PRE: 102 %
FVC-%CHANGE-POST: -18 %
FVC-%Pred-Post: 30 %
FVC-%Pred-Pre: 37 %
FVC-Post: 0.94 L
FVC-Pre: 1.15 L
POST FEV6/FVC RATIO: 100 %
PRE FEV6/FVC RATIO: 99 %
Post FEV1/FVC ratio: 88 %
Pre FEV1/FVC ratio: 76 %
RV % PRED: 70 %
RV: 1.5 L
TLC % pred: 50 %
TLC: 2.54 L

## 2015-04-19 SURGERY — LEFT HEART CATH AND CORONARY ANGIOGRAPHY

## 2015-04-19 MED ORDER — SODIUM CHLORIDE 0.9 % IJ SOLN
3.0000 mL | INTRAMUSCULAR | Status: DC | PRN
Start: 2015-04-19 — End: 2015-04-23

## 2015-04-19 MED ORDER — FUROSEMIDE 40 MG PO TABS
40.0000 mg | ORAL_TABLET | Freq: Every day | ORAL | Status: DC
  Administered 2015-04-20: 11:00:00 40 mg via ORAL
  Filled 2015-04-19 (×3): qty 1

## 2015-04-19 MED ORDER — HEPARIN SODIUM (PORCINE) 1000 UNIT/ML IJ SOLN
INTRAMUSCULAR | Status: DC | PRN
  Administered 2015-04-19: 4000 [IU] via INTRAVENOUS

## 2015-04-19 MED ORDER — ACETAMINOPHEN 325 MG PO TABS: 650.0000 mg | ORAL_TABLET | ORAL | Status: DC | PRN

## 2015-04-19 MED ORDER — SODIUM CHLORIDE 0.9 % IV SOLN: 250.0000 mL | INTRAVENOUS | Status: DC | PRN

## 2015-04-19 MED ORDER — LIDOCAINE HCL (PF) 1 % IJ SOLN
INTRAMUSCULAR | Status: DC | PRN
  Administered 2015-04-19: 08:00:00

## 2015-04-19 MED ORDER — ONDANSETRON HCL 4 MG/2ML IJ SOLN: 4.0000 mg | Freq: Four times a day (QID) | INTRAMUSCULAR | Status: DC | PRN

## 2015-04-19 MED ORDER — LIDOCAINE HCL (PF) 1 % IJ SOLN
INTRAMUSCULAR | Status: DC | PRN
Start: 2015-04-19 — End: 2015-04-19
  Administered 2015-04-19: 2 mL

## 2015-04-19 MED ORDER — FENTANYL CITRATE (PF) 100 MCG/2ML IJ SOLN
INTRAMUSCULAR | Status: AC
  Filled 2015-04-19: qty 4

## 2015-04-19 MED ORDER — LIDOCAINE HCL (PF) 1 % IJ SOLN
INTRAMUSCULAR | Status: AC
  Filled 2015-04-19: qty 30

## 2015-04-19 MED ORDER — FENTANYL CITRATE (PF) 100 MCG/2ML IJ SOLN
INTRAMUSCULAR | Status: DC | PRN
  Administered 2015-04-19: 25 ug via INTRAVENOUS

## 2015-04-19 MED ORDER — SODIUM CHLORIDE 0.9 % IJ SOLN
3.0000 mL | Freq: Two times a day (BID) | INTRAMUSCULAR | Status: DC
  Administered 2015-04-19 – 2015-04-23 (×6): 3 mL via INTRAVENOUS

## 2015-04-19 MED ORDER — HEPARIN (PORCINE) IN NACL 2-0.9 UNIT/ML-% IJ SOLN
INTRAMUSCULAR | Status: DC | PRN
  Administered 2015-04-19: 08:00:00 via INTRA_ARTERIAL

## 2015-04-19 MED ORDER — MIDAZOLAM HCL 2 MG/2ML IJ SOLN
INTRAMUSCULAR | Status: AC
  Filled 2015-04-19: qty 4

## 2015-04-19 MED ORDER — NITROGLYCERIN 1 MG/10 ML FOR IR/CATH LAB
INTRA_ARTERIAL | Status: AC
  Filled 2015-04-19: qty 10

## 2015-04-19 MED ORDER — MIDAZOLAM HCL 2 MG/2ML IJ SOLN
INTRAMUSCULAR | Status: DC | PRN
  Administered 2015-04-19: 2 mg via INTRAVENOUS

## 2015-04-19 MED ORDER — HEPARIN (PORCINE) IN NACL 2-0.9 UNIT/ML-% IJ SOLN
INTRAMUSCULAR | Status: AC
  Filled 2015-04-19: qty 1500

## 2015-04-19 MED ORDER — ALBUTEROL SULFATE (2.5 MG/3ML) 0.083% IN NEBU
2.5000 mg | INHALATION_SOLUTION | Freq: Once | RESPIRATORY_TRACT | Status: AC
  Administered 2015-04-19: 15:00:00 2.5 mg via RESPIRATORY_TRACT

## 2015-04-19 MED ORDER — IOHEXOL 350 MG/ML SOLN
INTRAVENOUS | Status: DC | PRN
  Administered 2015-04-19: 100 mL via INTRAVENOUS

## 2015-04-19 MED ORDER — SODIUM CHLORIDE 0.9 % WEIGHT BASED INFUSION: 1.0000 mL/kg/h | INTRAVENOUS | Status: AC

## 2015-04-19 SURGICAL SUPPLY — 12 items
CATH INFINITI 5 FR JL3.5 (CATHETERS) ×3 IMPLANT
CATH INFINITI 5FR ANG PIGTAIL (CATHETERS) ×3 IMPLANT
CATH INFINITI JR4 5F (CATHETERS) ×3 IMPLANT
DEVICE RAD COMP TR BAND LRG (VASCULAR PRODUCTS) ×3 IMPLANT
GLIDESHEATH SLEND SS 6F .021 (SHEATH) ×3 IMPLANT
KIT HEART LEFT (KITS) ×3 IMPLANT
PACK CARDIAC CATHETERIZATION (CUSTOM PROCEDURE TRAY) ×3 IMPLANT
SYR MEDRAD MARK V 150ML (SYRINGE) ×3 IMPLANT
TRANSDUCER W/STOPCOCK (MISCELLANEOUS) ×3 IMPLANT
TUBING CIL FLEX 10 FLL-RA (TUBING) ×3 IMPLANT
WIRE HI TORQ VERSACORE-J 145CM (WIRE) ×2 IMPLANT
WIRE SAFE-T 1.5MM-J .035X260CM (WIRE) ×3 IMPLANT

## 2015-04-19 NOTE — Telephone Encounter (Signed)
Pt is currently still admitted to [facility] per [facility]. Will leave this on triage desktop to follow up with the pt as a TCM call once discharged from the hospital.

## 2015-04-19 NOTE — Care Management Important Message (Signed)
Important Message  Patient Details  Name: [name] MRN: [id number] Date of Birth: [birth year]   Medicare Important Message Given:  Yes-third notification given    Delorse Lek 04/19/2015, 8:46 AM

## 2015-04-19 NOTE — Progress Notes (Signed)
TR BAND REMOVAL  LOCATION:    right radial  DEFLATED PER PROTOCOL:    Yes.    TIME BAND OFF / DRESSING APPLIED:    1030   SITE UPON ARRIVAL:    Level 0  SITE AFTER BAND REMOVAL:    Level 0  REVERSE ALLEN'S TEST:     positive  CIRCULATION SENSATION AND MOVEMENT:    Within Normal Limits   Yes.    COMMENTS:   Tolerated procedure well

## 2015-04-19 NOTE — Consult Note (Signed)
Reason for Consult:3 vessel CAD Referring Physician: Dr. Loreli Slot [name] is an 69 y.o. female.  HPI: 69 yo woman with a past history significant for diabetes, atrial fib/ flutter, hypertrophic cardiomyopathy, mitral prolapse, staph infection of left elbow requiring I & D. She was admitted in late August with urosepsis (Klebsiella). She was treated with antibiotics. During that admission she had an echo which showed normal LVEF of 33-35%, diastolic dysfunction, elevated LVEDP and left atrial pressures, mild concentric and severe focal basal septal hypertrophy, severe left atrial enlargement, mild to moderate RA enlargement, moderate mitral regurgitation, moderately reduced RV systolic function, and moderate to severe tricuspid regurgitation with mildly elevated pulmonary pressures (40 mmHg). No mitral valve prolapse seen.  She was readmitted one weak after discharge with malaise, peripheral edema and dizziness. She did not have any chest pain. She was having frequent runs of VT. She was in CHF with a BNP of 720. SH had an ICD placed on 9/12. She had a stress myoview on 9/14. EF was 35%, down considerably from her echo during the last admission. 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 LVEDP was 24. No right heart cath was done.  Past Medical History  Diagnosis Date  . Diabetes mellitus without complication     dx on wed  . Mitral valve prolapse   . Atrial flutter   . Atrial fibrillation   . Hypertrophic cardiomyopathy   . Left atrial enlargement   . Tricuspid regurgitation   . Ventricular tachycardia     Past Surgical History  Procedure Laterality Date  . Dental surgery  Oct. 2011    several extractions, bone graft  . I&d extremity Left 01/06/2013    Procedure: IRRIGATION AND DEBRIDEMENT LEFT ELBOW AND LEFT FOREARM ;  Surgeon: [name], MD;  Location: [facility];  Service: Orthopedics;  Laterality: Left;  . Application of wound vac Left 01/06/2013   Procedure: APPLICATION OF WOUND VAC X 2;  Surgeon: [name], MD;  Location: [facility];  Service: Orthopedics;  Laterality: Left;  left forearm  . Incision and drainage Left 01/09/2013    Procedure: REDO INCISION AND DRAINAGE LEFT ELBOW;  Surgeon: [name], MD;  Location: [facility];  Service: Orthopedics;  Laterality: Left;  SUPINE, UPPER EXTERMITY DRAPE  . Secondary closure of wound Left 01/09/2013    Procedure: SECONDARY CLOSURE OF WOUND  LEFT ELBOW;  Surgeon: [name], MD;  Location: [facility];  Service: Orthopedics;  Laterality: Left;  . Application of wound vac Left 01/09/2013    Procedure: APPLICATION OF WOUND VAC;  Surgeon: [name], MD;  Location: [facility];  Service: Orthopedics;  Laterality: Left;  . Ep implantable device N/A 04/16/2015    Procedure: ICD Implant;  Surgeon: [name], MD;  Location: [facility];  Service: Cardiovascular;  Laterality: N/A;  . Cardiac catheterization N/A 04/19/2015    Procedure: Left Heart Cath and Coronary Angiography;  Surgeon: [name], MD;  Location: [facility];  Service: Cardiovascular;  Laterality: N/A;    Family History  Problem Relation Age of Onset  . Cancer Mother   . Diabetes Father     Social History:  reports that she quit smoking about 34 years ago. She has never used smokeless tobacco. She reports that she does not drink alcohol or use illicit drugs.  Allergies: No Known Allergies  Medications:  Prior to Admission:  Prescriptions prior to admission  Medication  Sig Dispense Refill Last Dose  . cephALEXin (KEFLEX) 500 MG capsule Take 1 capsule (500 mg total) by mouth every 8 (eight) hours. 21 capsule 0 04/13/2015 at Unknown time  . furosemide (LASIX) 40 MG tablet Take 1 tablet (40 mg total) by mouth daily. 30 tablet 1 04/13/2015 at Unknown time  . metoprolol succinate (TOPROL-XL) 25 MG 24 hr tablet Take 1.5 tablets (37.5 mg total) by mouth 2 (two) times daily. 60 tablet 1 04/13/2015 at 0900  . rivaroxaban  (XARELTO) 20 MG TABS tablet Take 1 tablet (20 mg total) by mouth daily. 30 tablet 1 04/13/2015 at Unknown time    Results for orders placed or performed during the hospital encounter of 04/13/15 (from the past 48 hour(s))  Basic metabolic panel     Status: Abnormal   Collection Time: 04/18/15  3:33 AM  Result Value Ref Range   Sodium 140 135 - 145 mmol/L   Potassium 3.8 3.5 - 5.1 mmol/L   Chloride 103 101 - 111 mmol/L   CO2 30 22 - 32 mmol/L   Glucose, Bld 172 (H) 65 - 99 mg/dL   BUN 22 (H) 6 - 20 mg/dL   Creatinine, Ser 0.79 0.44 - 1.00 mg/dL   Calcium 8.8 (L) 8.9 - 10.3 mg/dL   GFR calc non Af Amer >60 >60 mL/min   GFR calc Af Amer >60 >60 mL/min    Comment: (NOTE) The eGFR has been calculated using the CKD EPI equation. This calculation has not been validated in all clinical situations. eGFR's persistently <60 mL/min signify possible Chronic Kidney Disease.    Anion gap 7 5 - 15  Basic metabolic panel     Status: Abnormal   Collection Time: 04/19/15  3:23 AM  Result Value Ref Range   Sodium 140 135 - 145 mmol/L   Potassium 3.7 3.5 - 5.1 mmol/L   Chloride 104 101 - 111 mmol/L   CO2 28 22 - 32 mmol/L   Glucose, Bld 199 (H) 65 - 99 mg/dL   BUN 25 (H) 6 - 20 mg/dL   Creatinine, Ser 0.79 0.44 - 1.00 mg/dL   Calcium 8.7 (L) 8.9 - 10.3 mg/dL   GFR calc non Af Amer >60 >60 mL/min   GFR calc Af Amer >60 >60 mL/min    Comment: (NOTE) The eGFR has been calculated using the CKD EPI equation. This calculation has not been validated in all clinical situations. eGFR's persistently <60 mL/min signify possible Chronic Kidney Disease.    Anion gap 8 5 - 15    No results found.  Review of Systems  Constitutional: Positive for malaise/fatigue. Negative for fever.  Respiratory: Positive for shortness of breath and wheezing.   Cardiovascular: Positive for orthopnea and leg swelling. Negative for chest pain.  Gastrointestinal: Positive for nausea and vomiting.  Genitourinary: Positive  for dysuria and frequency.       Prior to treatment of recent UTI  Neurological: Positive for dizziness. Negative for focal weakness and loss of consciousness.  All other systems reviewed and are negative.  Blood pressure 146/62, pulse 65, temperature 97.9 F (36.6 C), temperature source Oral, resp. rate 19, height 5' 4"  (1.626 m), weight 187 lb 2.7 oz (84.9 kg), SpO2 91 %. Physical Exam  Vitals reviewed. Constitutional: She is oriented to person, place, and time. No distress.  HENT:  Head: Normocephalic and atraumatic.  Eyes: Conjunctivae and EOM are normal. No scleral icterus.  Neck: Neck supple. No thyromegaly present.  Cardiovascular: Normal rate and  regular rhythm.   Murmur (2/6 systolic) heard. Respiratory: Effort normal. She has no wheezes.  Diminished BS both bases  GI: Soft. Bowel sounds are normal. She exhibits no distension. There is no tenderness.  Musculoskeletal: She exhibits edema (1+).  Lymphadenopathy:    She has no cervical adenopathy.  Neurological: She is alert and oriented to person, place, and time. No cranial nerve deficit. She exhibits normal muscle tone.  Skin: Skin is warm and dry.    Assessment/Plan: 69 yo woman with complex cardiac issues. She recently had urosepsis. She was then readmitted with CHF and VT. She has not had any chest pain. She had a recent echo which showed hypertrophic cardiomyopathy, mitral and tricuspid regurgitation and RV dysfunction. Her LV function is considerably worse this admission than was reported on catheterization. She also has a history of a fib/ flutter. She had an ICD placed. She then had a positive stress test. She has 3 vessel CAD with diffusely diseased targets by cath. Unfortunately a right heart cath wasn't done.  Her PFTs showed severely decreased FVC and FEV1, although her pleural effusions could be contributing to that issue. It will be worth having pulmonary evaluate her.  Will d/w Cardiology tomorrow.  Melrose Nakayama 04/19/2015, 10:13 PM

## 2015-04-19 NOTE — Interval H&P Note (Signed)
Cath Lab Visit (complete for each Cath Lab visit)  Clinical Evaluation Leading to the Procedure:   ACS: No.  Non-ACS:    Anginal Classification: CCS II  Anti-ischemic medical therapy: Maximal Therapy (2 or more classes of medications)  Non-Invasive Test Results: Intermediate-risk stress test findings: cardiac mortality 1-3%/year  Prior CABG: No previous CABG      History and Physical Interval Note:  04/19/2015 7:55 AM  [name]  has presented today for surgery, with the diagnosis of cp  The various methods of treatment have been discussed with the patient and family. After consideration of risks, benefits and other options for treatment, the patient has consented to  Procedure(s): Left Heart Cath and Coronary Angiography (N/A) as a surgical intervention .  The patient's history has been reviewed, patient examined, no change in status, stable for surgery.  I have reviewed the patient's chart and labs.  Questions were answered to the patient's satisfaction.     Laurena Valko S.

## 2015-04-19 NOTE — Progress Notes (Signed)
UR COMPLETED  

## 2015-04-19 NOTE — Care Management Note (Addendum)
Case Management Note  Patient Details  Name: [name] MRN: [id number] Date of Birth: 1946/04/20  Subjective/Objective:                 Admitted with VT/ARF from home with family, s/p ICD 04/16/15.    Action/Plan:   Positive lexiscan, cath planned for 04/19/15. Return to home when medically stable. CM to f/u with disposition needs.  Expected Discharge Date:                  Expected Discharge Plan:  Home/Self Care  In-House Referral:     Discharge planning Services  CM Consult  Post Acute Care Choice:    Choice offered to:     DME Arranged:    DME Agency:     HH Arranged:    HH Agency:     Status of Service:  In process, will continue to follow  Medicare Important Message Given:   Date Medicare IM Given:    Medicare IM give by:    Date Additional Medicare IM Given:    Additional Medicare Important Message give by:     If discussed at [facility] of Stay Meetings, dates discussed:    Additional Comments: 04/17/15 positive lexiscan, 04/19/15 s/p cath.- 3vd noted, cvts to consult :  plan to discharge with medical management initially. Then reassess for possible CABG, etc in 2-3 months.  Whitman Hero [place], [place] [phone] 04/19/2015, 5:15 AM

## 2015-04-19 NOTE — H&P (View-Only) (Signed)
SUBJECTIVE: The patient is doing well today.  At this time, she denies chest pain, shortness of breath, or any new concerns.    CURRENT MEDICATIONS: . amiodarone  200 mg Oral BID  . furosemide  40 mg Oral Daily  . metoprolol succinate  37.5 mg Oral BID  . potassium chloride  20 mEq Oral Daily  . rivaroxaban  20 mg Oral Q supper      OBJECTIVE: Physical Exam: Filed Vitals:   04/17/15 1645 04/17/15 2031 04/18/15 0347 04/18/15 0528  BP: 144/65 156/72 115/65   Pulse:  68 87   Temp:  97.5 F (36.4 C) 97.8 F (36.6 C)   TempSrc:  Oral Oral   Resp:  18 18   Height:      Weight:    184 lb 4.9 oz (83.6 kg)  SpO2:  92% 95%     Intake/Output Summary (Last 24 hours) at 04/18/15 0731 Last data filed at 04/18/15 0237  Gross per 24 hour  Intake    720 ml  Output   1500 ml  Net   -780 ml    Telemetry reveals sinus rhythm, atrial fibrillation last night  GEN- The patient is well appearing, alert and oriented x 3 today.   Head- normocephalic, atraumatic Eyes-  Sclera clear, conjunctiva pink Ears- hearing intact Oropharynx- clear Neck- supple  Lungs- Clear to ausculation bilaterally, normal work of breathing Heart- Regular rate and rhythm  GI- soft, NT, ND, + BS Extremities- no clubbing, cyanosis, or edema Skin- no rash or lesion Psych- anxious mood, full affect Neuro- strength and sensation are intact  LABS: Basic Metabolic Panel:  Recent Labs  04/17/15 0354 04/18/15 0333  NA 139 140  K 4.1 3.8  CL 102 103  CO2 28 30  GLUCOSE 177* 172*  BUN 24* 22*  CREATININE 0.87 0.79  CALCIUM 8.9 8.8*    RADIOLOGY: Ct Coronary Morp W/cta Cor W/score W/ca W/cm &/or Wo/cm 04/16/2015   ADDENDUM REPORT: 04/16/2015 17:56  CLINICAL DATA:  69 year old female with hypertrophic cardiomyopathy who presented with VT storm. Evaluate for obstructive CAD.  EXAM: Cardiac/Coronary  CT  TECHNIQUE: The patient was scanned on a Philips 256 scanner.  FINDINGS: A 120 kV prospective scan was  triggered in the descending thoracic aorta at 111 HU's. Axial non-contrast 3 mm slices were carried out through the heart. The data set was analyzed on a dedicated work station and scored using the [employer]edar. Gantry rotation speed was 270 msecs and collimation was .9 mm. No beta blockade and 0.4 mg of sl NTG was given. The 3D data set was reconstructed in 5% intervals of the 67-82 % of the R-R cycle. Diastolic phases were analyzed on a dedicated work station using MPR, MIP and VRT modes. The patient received 80 cc of contrast.  Aorta:  Normal caliber, mild diffuse calcifications.  No dissection.  Aortic Valve:  Trileaflet.  Minimal calcifications of the leaflets.  Coronary Arteries:  Normal origin.  Right dominance.  The quality is affected by significant ectopy during acquisition resulting in motion.  RCA is a large dominant vessel. There appears to be diffuse mixed plaque but no severe stenosis.  Left main is a long and large vessel that has minimal calcified plaque in its distal portion associated with 0-25% stenosis.  LAD is a large vessel that has severe diffuse calcified plaque predominantly in the proximal and mid segment. There is moderate calcified lesion in the proximal segment associated with 50-69% stenosis. In  the mid LAD there are two segments of mixed, predominantly calcified plaque with possible stenosis > stenosis. The more accurate estimate not possible secondary to motion artifact.  There is a diagonal branch originating of mid LAD with possible severe stenosis in the proximal segment.  A small ramus intermedius has mild calcified plaque in the proximal segment.  LCX artery is medium caliber vessel that gives rise to one small obtuse marginal branch. A minimal calcified plaque associated with 0-25% stenosis is present in the proximal segment.  IMPRESSION: 1. Coronary calcium score of 498. 416 is located in the proximal to mid LAD. This was 66 percentile for age and sex matched control.  2.  The quality of the study is affected by significant ectopy and motion.  3. Normal coronary origin with right dominance.  4. There is moderate diffuse mixed, predominantly calcified plaque in LAD.  5. There is suspicion for severe stenosis in the mid LAD and in the first diagonal branch. Because the study quality is affected by ectopy and motion a functional study with a stress test is recommended to evaluate for ischemia in this territory.  Ena Dawley   Electronically Signed   By: Ena Dawley   On: 04/16/2015 17:56   ASSESSMENT AND PLAN:  Active Problems:   Ventricular tachycardia   Acute respiratory failure  1.  Ventricular tachycardia S/p ICD 04/16/15 for secondary prevention Continue Amiodarone 200mg  bid until seen in office for follow up Keep K >3.9, Mg >1.8 No driving x6 months (pt aware)  2.  Abnormal coronary CT/myoview Dr Lovena Le discussed with patient today, recommend cardiac catheterization today. Risks, benefits reviewed with the patient by Dr Lovena Le today who wishes to proceed. She took Xarelto last night, will hold tonight and plan cath in the morning.   2.  Paroxysmal atrial fibrillation Continue Xarelto for CHADS2VASC of at least 3 An episode of rate controlled AF overnight, now in SR  3.  Hypoxia Significantly improved with diuresis  4.  Acute on chronic diastolic dysfunction Euvolemic on exam    Chanetta Marshall, NP 04/18/2015 7:31 AM    EP Attending  Patient seen and examined. Agree with above. Will plan to proceed with left heart cath due to high risk perfusion scan. Cannot go today because of xarelto.   Mikle Bosworth.D.

## 2015-04-20 DIAGNOSIS — I270 Primary pulmonary hypertension: Secondary | ICD-10-CM

## 2015-04-20 MED ORDER — ALPRAZOLAM 0.25 MG PO TABS
0.2500 mg | ORAL_TABLET | Freq: Three times a day (TID) | ORAL | Status: DC | PRN
  Filled 2015-04-20: qty 1

## 2015-04-20 NOTE — Progress Notes (Addendum)
Patient ID: [name], female   DOB: 1945-09-22, 69 y.o.   MRN: [id number]    Patient Name: [name] Date of Encounter: 04/20/2015     Active Problems:   Ventricular tachycardia   Acute respiratory failure    SUBJECTIVE  She is appropriately anxious. Note findings of Dr. Roxan Hockey. The patient denies sob or chest pain.   CURRENT MEDS . amiodarone  200 mg Oral BID  . furosemide  40 mg Oral Daily  . metoprolol succinate  37.5 mg Oral BID  . potassium chloride  20 mEq Oral Daily  . sodium chloride  3 mL Intravenous Q12H    OBJECTIVE  Filed Vitals:   04/19/15 1201 04/19/15 1646 04/19/15 2046 04/20/15 0408  BP: 148/61 159/76 146/62 150/56  Pulse: 60 65 65 61  Temp: 98 F (36.7 C) 98 F (36.7 C) 97.9 F (36.6 C) 98.1 F (36.7 C)  TempSrc: Oral Oral Oral Oral  Resp: 22 20 19 20   Height:      Weight:    195 lb 8.8 oz (88.7 kg)  SpO2: 97% 97% 91% 94%    Intake/Output Summary (Last 24 hours) at 04/20/15 0810 Last data filed at 04/19/15 2047  Gross per 24 hour  Intake 840.83 ml  Output    650 ml  Net 190.83 ml   Filed Weights   04/18/15 0528 04/19/15 0300 04/20/15 0408  Weight: 184 lb 4.9 oz (83.6 kg) 187 lb 2.7 oz (84.9 kg) 195 lb 8.8 oz (88.7 kg)    PHYSICAL EXAM  General: Pleasant, NAD. Neuro: Alert and oriented X 3. Moves all extremities spontaneously. Psych: Normal affect. HEENT:  Normal  Neck: Supple without bruits or JVD. Lungs:  Resp regular and unlabored, CTA. ICD incision without hematoma Heart: RRR no s3, s4, or murmurs. Abdomen: Soft, non-tender, non-distended, BS + x 4.  Extremities: No clubbing, cyanosis or edema. DP/PT/Radials 2+ and equal bilaterally.  Accessory Clinical Findings  CBC No results for input(s): WBC, NEUTROABS, HGB, HCT, MCV, PLT in the last 72 hours. Basic Metabolic Panel  Recent Labs  04/18/15 0333 04/19/15 0323  NA 140 140  K 3.8 3.7  CL 103 104  CO2 30 28  GLUCOSE 172* 199*  BUN 22* 25*    CREATININE 0.79 0.79  CALCIUM 8.8* 8.7*   Liver Function Tests No results for input(s): AST, ALT, ALKPHOS, BILITOT, PROT, ALBUMIN in the last 72 hours. No results for input(s): LIPASE, AMYLASE in the last 72 hours. Cardiac Enzymes No results for input(s): CKTOTAL, CKMB, CKMBINDEX, TROPONINI in the last 72 hours. BNP Invalid input(s): POCBNP D-Dimer No results for input(s): DDIMER in the last 72 hours. Hemoglobin A1C No results for input(s): HGBA1C in the last 72 hours. Fasting Lipid Panel No results for input(s): CHOL, HDL, LDLCALC, TRIG, CHOLHDL, LDLDIRECT in the last 72 hours. Thyroid Function Tests No results for input(s): TSH, T4TOTAL, T3FREE, THYROIDAB in the last 72 hours.  Invalid input(s): FREET3  TELE  NSR with atrial pacing  Radiology/Studies  Dg Chest 2 View  04/17/2015   CLINICAL DATA:  Cardiac pacer.  EXAM: CHEST  2 VIEW  COMPARISON:  CT 04/16/2015.  Chest x-ray 04/14/2015.  FINDINGS: Mediastinum and hilar structures normal. Cardiac pacer with lead tips in right atrium right ventricle . Cardiomegaly with pulmonary vascular prominence and interstitial prominence bilateral effusions. Findings consistent congestive heart failure. Findings have improved scratched from prior study of 04/14/2015  IMPRESSION: Congestive heart failure with pulmonary interstitial edema and bilateral effusions.  Slight improvement from prior exam. Cardiac pacer in stable position.   Electronically Signed   By: Marcello Moores  Register   On: 04/17/2015 09:21   Nm Myocar Multi W/spect W/wall Motion / Ef  04/17/2015   CLINICAL DATA:  Shortness of breath, V-tach, left atrial enlargement, mitral valve prolapse, hypertrophic cardiomyopathy  EXAM: MYOCARDIAL IMAGING WITH SPECT (REST AND PHARMACOLOGIC-STRESS)  GATED LEFT VENTRICULAR WALL MOTION STUDY  LEFT VENTRICULAR EJECTION FRACTION  TECHNIQUE: Standard myocardial SPECT imaging was performed after resting intravenous injection of 10 mCi Tc-33m sestamibi.  Subsequently, intravenous infusion of Lexiscan was performed under the supervision of the Cardiology staff. At peak effect of the drug, 30 mCi Tc-47m sestamibi was injected intravenously and standard myocardial SPECT imaging was performed. Quantitative gated imaging was also performed to evaluate left ventricular wall motion, and estimate left ventricular ejection fraction.  COMPARISON:  None.  FINDINGS: Perfusion: Small to medium sized, mild severity fixed defect along the inferolateral wall, suspicious for prior infarct, with associated mild reversible peri-lesional ischemia. Additional small, mild reversible defect along the anterior wall.  Wall Motion: Hypokinesia involving the inferolateral wall. No left ventricular dilation.  Left Ventricular Ejection Fraction: 34 %  End diastolic volume 89 ml  End systolic volume 58 ml  IMPRESSION: 1. Small, mild reversible defect along the anterior wall of the left ventricle, possibly reflecting acute ischemia. Additional small to medium-sized fixed defect involving the inferolateral wall, suggesting prior infarct, but with possible mild peri-lesional ischemia.  2. Associated hypokinesia involving the inferolateral wall of the left ventricle.  3.  Decreased left ventricular ejection fraction of 34%.  4. High-risk stress test findings*.  *2012 Appropriate Use Criteria for Coronary Revascularization Focused Update: J Am Coll Cardiol. 8250;03(7):048-889. http://content.airportbarriers.com.aspx?articleid=1201161  These results will be called to the ordering clinician or representative by the Radiologist Assistant, and communication documented in the PACS or zVision Dashboard.   Electronically Signed   By: Julian Hy M.D.   On: 04/17/2015 15:29   Ct Coronary Morp W/cta Cor W/score W/ca W/cm &/or Wo/cm  04/16/2015   ADDENDUM REPORT: 04/16/2015 17:56  CLINICAL DATA:  69 year old female with hypertrophic cardiomyopathy who presented with VT storm. Evaluate for  obstructive CAD.  EXAM: Cardiac/Coronary  CT  TECHNIQUE: The patient was scanned on a Philips 256 scanner.  FINDINGS: A 120 kV prospective scan was triggered in the descending thoracic aorta at 111 HU's. Axial non-contrast 3 mm slices were carried out through the heart. The data set was analyzed on a dedicated work station and scored using the Cowen. Gantry rotation speed was 270 msecs and collimation was .9 mm. No beta blockade and 0.4 mg of sl NTG was given. The 3D data set was reconstructed in 5% intervals of the 67-82 % of the R-R cycle. Diastolic phases were analyzed on a dedicated work station using MPR, MIP and VRT modes. The patient received 80 cc of contrast.  Aorta:  Normal caliber, mild diffuse calcifications.  No dissection.  Aortic Valve:  Trileaflet.  Minimal calcifications of the leaflets.  Coronary Arteries:  Normal origin.  Right dominance.  The quality is affected by significant ectopy during acquisition resulting in motion.  RCA is a large dominant vessel. There appears to be diffuse mixed plaque but no severe stenosis.  Left main is a long and large vessel that has minimal calcified plaque in its distal portion associated with 0-25% stenosis.  LAD is a large vessel that has severe diffuse calcified plaque predominantly in the proximal and mid segment.  There is moderate calcified lesion in the proximal segment associated with 50-69% stenosis. In the mid LAD there are two segments of mixed, predominantly calcified plaque with possible stenosis > stenosis. The more accurate estimate not possible secondary to motion artifact.  There is a diagonal branch originating of mid LAD with possible severe stenosis in the proximal segment.  A small ramus intermedius has mild calcified plaque in the proximal segment.  LCX artery is medium caliber vessel that gives rise to one small obtuse marginal branch. A minimal calcified plaque associated with 0-25% stenosis is present in the proximal segment.   IMPRESSION: 1. Coronary calcium score of 498. 416 is located in the proximal to mid LAD. This was 32 percentile for age and sex matched control.  2. The quality of the study is affected by significant ectopy and motion.  3. Normal coronary origin with right dominance.  4. There is moderate diffuse mixed, predominantly calcified plaque in LAD.  5. There is suspicion for severe stenosis in the mid LAD and in the first diagonal branch. Because the study quality is affected by ectopy and motion a functional study with a stress test is recommended to evaluate for ischemia in this territory.  Ena Dawley   Electronically Signed   By: Ena Dawley   On: 04/16/2015 17:56   04/16/2015   EXAM: OVER-READ INTERPRETATION  CT CHEST  The following report is an over-read performed by radiologist Dr. Forest Gleason [facility] on 04/16/2015. This over-read does not include interpretation of cardiac or coronary anatomy or pathology. The CTA interpretation by the cardiologist is attached.  COMPARISON:  Plain film of 04/14/2015  FINDINGS: Mediastinum/Nodes: Pulmonary artery enlargement, outflow tract 3.8 cm. precarinal node measures 1.2 cm on image 3 of series 504. No gross hilar adenopathy.  Lungs/Pleura: Small right larger than left pleural effusions. Heterogeneous ground-glass opacities throughout both lungs with interlobular septal thickening. Bibasilar dependent airspace disease which is likely atelectasis, including within the posterior lingula.  Upper abdomen: Normal imaged portions of the liver, spleen, stomach.  Musculoskeletal: No acute osseous abnormality.  IMPRESSION: 1. Congestive heart failure, with bilateral pleural effusions, septal thickening, and ground-glass opacities. 2. Pulmonary artery enlargement suggests pulmonary arterial hypertension. 3. Mild precarinal adenopathy. This could also relate to congestive heart failure. Standard chest CT follow-up at 3 months could confirm stability or  resolution.  Electronically Signed: By: Abigail Miyamoto M.D. On: 04/16/2015 15:04   Dg Chest Port 1 View  04/14/2015   CLINICAL DATA:  Acute respiratory failure.  Diabetes mellitus.  EXAM: PORTABLE CHEST - 1 VIEW  COMPARISON:  04/13/2015.  FINDINGS: Cardiomegaly persists. BILATERAL pulmonary edema is redemonstrated, perhaps slightly improved with regard interstitial prominence. Large LEFT pleural effusion is now observed. Overlying telemetry leads.  IMPRESSION: Slight improvement in pulmonary edema, with increasing LEFT pleural effusion. Cardiomegaly.   Electronically Signed   By: Staci Righter M.D.   On: 04/14/2015 10:26   Dg Chest Port 1 View  04/13/2015   CLINICAL DATA:  Chest pain  EXAM: PORTABLE CHEST - 1 VIEW  COMPARISON:  03/31/2015  FINDINGS: Cardiac enlargement. Diffuse bilateral airspace disease has progressed since the prior study most consistent with pulmonary edema. Small bilateral pleural effusions with bibasilar atelectasis.  IMPRESSION: Congestive heart failure with bilateral edema and small pleural effusions. Worsening of edema since the prior study.   Electronically Signed   By: Franchot Gallo M.D.   On: 04/13/2015 14:02   Dg Chest Port 1 View  03/31/2015  CLINICAL DATA:  Weakness and shortness of breath. Near syncope. Bilateral lower extremity swelling.  EXAM: PORTABLE CHEST - 1 VIEW  COMPARISON:  01/01/2013  FINDINGS: Cardiomegaly is again seen. There is progressive interstitial and perihilar opacities concerning for pulmonary edema. Mild atelectasis at the right lung base. No confluent airspace disease. Questionable blunting of left costophrenic angle. No pneumothorax.  IMPRESSION: Findings suspicious for CHF.   Electronically Signed   By: Jeb Levering M.D.   On: 03/31/2015 01:21    ASSESSMENT AND PLAN  1. VT - she is s/p ICD and doing well on beta blocker and amio 2. 3 vessel CAD - unclear if she really has abnormal LV function. It was not by echo. Poor targets and multiple  comorbidities make her a less than optimal candidate for CABG. I would suggest we allow her to go home, reassess her pulmonary function/let her be seen by pulmonary in followup and work on pulmonary toilet and then re-evaluate her surgical candidacy as an outpatient. I'll discuss with surgical service first.  3. Atrial fib - she is maintaining NSR on amio.  4. Pulmonary HTN - her echo 2 weeks ago demonstrated a PA pressure of 40.   Gregg Milt Coye,M.D.  04/20/2015 8:10 AM

## 2015-04-20 NOTE — Progress Notes (Signed)
UR COMPLETED  

## 2015-04-20 NOTE — Telephone Encounter (Signed)
Patient still currently admitted as of this morning. Will leave on triage desktop for follow up on Monday 9/19.

## 2015-04-20 NOTE — Progress Notes (Signed)
1 Day Post-Op Procedure(s) (LRB): Left Heart Cath and Coronary Angiography (N/A) Subjective: Anxoius, no CP or SOB  Objective: Vital signs in last 24 hours: Temp:  [97.6 F (36.4 C)-98.1 F (36.7 C)] 97.8 F (36.6 C) (09/16 1417) Pulse Rate:  [60-65] 60 (09/16 1417) Cardiac Rhythm:  [-] Atrial paced;Bundle branch block;Other (Comment) (09/16 0849) Resp:  [18-20] 18 (09/16 1417) BP: (118-150)/(51-62) 125/57 mmHg (09/16 1417) SpO2:  [91 %-95 %] 95 % (09/16 1417) Weight:  [195 lb 8.8 oz (88.7 kg)] 195 lb 8.8 oz (88.7 kg) (09/16 0408)  Hemodynamic parameters for last 24 hours:    Intake/Output from previous day: 09/15 0701 - 09/16 0700 In: 840.8 [P.O.:480; I.V.:360.8] Out: 650 [Urine:650] Intake/Output this shift: Total I/O In: 620 [P.O.:620] Out: 500 [Urine:500]  General appearance: alert, cooperative and no distress Heart: irregularly irregular rhythm Lungs: diminished breath sounds bibasilar  Lab Results: No results for input(s): WBC, HGB, HCT, PLT in the last 72 hours. BMET:  Recent Labs  04/18/15 0333 04/19/15 0323  NA 140 140  K 3.8 3.7  CL 103 104  CO2 30 28  GLUCOSE 172* 199*  BUN 22* 25*  CREATININE 0.79 0.79  CALCIUM 8.8* 8.7*    PT/INR: No results for input(s): LABPROT, INR in the last 72 hours. ABG    Component Value Date/Time   PHART 7.388 04/14/2015 0337   HCO3 24.7* 04/14/2015 0337   TCO2 26.0 04/14/2015 0337   O2SAT 95.5 04/14/2015 0337   CBG (last 3)  No results for input(s): GLUCAP in the last 72 hours.  Assessment/Plan: S/P Procedure(s) (LRB): Left Heart Cath and Coronary Angiography (N/A) -  Discussed with Dr. Lovena Le. Agree with plan to discharge with medical management initially. Then reassess for possible CABG, etc in 2-3 months.   LOS: 7 days    Melrose Nakayama 04/20/2015

## 2015-04-20 NOTE — Progress Notes (Signed)
CARDIAC REHAB PHASE I   PRE:  Rate/Rhythm: 60 pacing    BP: sitting 121/51    SaO2: 92 RA  MODE:  Ambulation: 60 ft   POST:  Rate/Rhythm: 60 paicng    BP: sitting 116/88     SaO2: 86 RA, up to 96 2L  MODE:  Ambulation: 180 ft   POST:  Rate/Rhythm: 61 SR (no paced bts)    BP: sitting 118/51     SaO2: 92 2L  Attempted to walk without O2 but pt quickly felt SOB and dizzy, SAO2 86 RA. Returned to room, rested and applied 2L. Pt able to walk much better on 2L, fairly steady with RW and gait belt although she sts her right knee feels unsupportive (sts she needs it replaced). Return to recliner, VSS, left on 1 1/2 L. Pt practiced IS, only able to inspire 500 mL with great effort. Encouraged her to practice IS consistently today. Did not discuss OHS due to uncertainty of plan. Will f/u in am. She needs to walk more. If for d/c, will need O2.  [phone] SATURATION QUALIFICATIONS: (This note is used to comply with regulatory documentation for home oxygen)  Patient Saturations on Room Air at Rest = 92%  Patient Saturations on Room Air while Ambulating = 86%  Patient Saturations on 2 Liters of oxygen while Ambulating = 92%  Please briefly explain why patient needs home oxygen: see above  Darrick Meigs CES, ACSM 04/20/2015 8:59 AM        Darrick Meigs CES, ACSM 04/20/2015 8:58 AM

## 2015-04-21 ENCOUNTER — Encounter (HOSPITAL_COMMUNITY): Payer: Self-pay | Admitting: Physician Assistant

## 2015-04-21 DIAGNOSIS — I2510 Atherosclerotic heart disease of native coronary artery without angina pectoris: Secondary | ICD-10-CM

## 2015-04-21 DIAGNOSIS — J9600 Acute respiratory failure, unspecified whether with hypoxia or hypercapnia: Secondary | ICD-10-CM

## 2015-04-21 HISTORY — DX: Atherosclerotic heart disease of native coronary artery without angina pectoris: I25.10

## 2015-04-21 LAB — GLUCOSE, CAPILLARY
GLUCOSE-CAPILLARY: 172 mg/dL — AB (ref 65–99)
Glucose-Capillary: 189 mg/dL — ABNORMAL HIGH (ref 65–99)

## 2015-04-21 MED ORDER — METOPROLOL SUCCINATE ER 25 MG PO TB24
25.0000 mg | ORAL_TABLET | Freq: Two times a day (BID) | ORAL | Status: DC
  Administered 2015-04-21 – 2015-04-22 (×2): 25 mg via ORAL
  Filled 2015-04-21 (×2): qty 1

## 2015-04-21 MED ORDER — INSULIN ASPART 100 UNIT/ML ~~LOC~~ SOLN: 0.0000 [IU] | Freq: Every day | SUBCUTANEOUS | Status: DC

## 2015-04-21 MED ORDER — FUROSEMIDE 10 MG/ML IJ SOLN
40.0000 mg | Freq: Once | INTRAMUSCULAR | Status: AC
  Administered 2015-04-21: 40 mg via INTRAVENOUS
  Filled 2015-04-21: qty 4

## 2015-04-21 MED ORDER — INSULIN ASPART 100 UNIT/ML ~~LOC~~ SOLN
0.0000 [IU] | Freq: Three times a day (TID) | SUBCUTANEOUS | Status: DC
  Administered 2015-04-21 – 2015-04-23 (×5): 3 [IU] via SUBCUTANEOUS
  Administered 2015-04-23: 8 [IU] via SUBCUTANEOUS

## 2015-04-21 MED ORDER — FUROSEMIDE 10 MG/ML IJ SOLN
40.0000 mg | Freq: Two times a day (BID) | INTRAMUSCULAR | Status: DC
  Administered 2015-04-21 – 2015-04-22 (×3): 40 mg via INTRAVENOUS
  Filled 2015-04-21 (×3): qty 4

## 2015-04-21 MED ORDER — RIVAROXABAN 20 MG PO TABS
20.0000 mg | ORAL_TABLET | Freq: Every day | ORAL | Status: DC
  Administered 2015-04-21 – 2015-04-22 (×2): 20 mg via ORAL
  Filled 2015-04-21 (×2): qty 1

## 2015-04-21 NOTE — Progress Notes (Signed)
Pt O2 Sat on room air at night=86%. Placed on O2 @ 2L/min Sats=96%. Encouraged to use IS.

## 2015-04-21 NOTE — Progress Notes (Addendum)
Pt back in Afib with Hr of 80-100's. Pt asymptomatic. Pt received her Metoprolol and Amiodarone this evening. Will continue to monitor. Dr Rosanna Randy made aware.

## 2015-04-21 NOTE — Progress Notes (Addendum)
Patient Name: [name] Date of Encounter: 04/21/2015  Principal Problem:   Ventricular tachycardia Active Problems:   Diabetes mellitus type 2, noninsulin dependent   Atrial fibrillation   Acute respiratory failure   CAD (coronary artery disease), native coronary artery - 3 vessel   Primary Cardiologist: Dr Mare Ferrari  Patient Profile: 69 yo female w/ hx PAF, DM, mild apical hypertrophic CM, on Xarelto, and recent urosepsis, was admitted 04/13/2015 with VT.    SUBJECTIVE: No chest pain, no palpitations, concerned about lower extremity edema  OBJECTIVE Filed Vitals:   04/20/15 1417 04/20/15 1943 04/21/15 0411 04/21/15 0733  BP: 125/57 154/65 154/65 98/65  Pulse: 60 60 60 60  Temp: 97.8 F (36.6 C) 98.1 F (36.7 C) 97.8 F (36.6 C) 98.1 F (36.7 C)  TempSrc: Oral Oral Oral Oral  Resp: 18 19 20 20   Height:      Weight:   195 lb 12.3 oz (88.8 kg)   SpO2: 95% 96% 96% 94%    Intake/Output Summary (Last 24 hours) at 04/21/15 0851 Last data filed at 04/21/15 0826  Gross per 24 hour  Intake    740 ml  Output   1600 ml  Net   -860 ml   Filed Weights   04/19/15 0300 04/20/15 0408 04/21/15 0411  Weight: 187 lb 2.7 oz (84.9 kg) 195 lb 8.8 oz (88.7 kg) 195 lb 12.3 oz (88.8 kg)    PHYSICAL EXAM General: Well developed, well nourished, female in no acute distress. Head: Normocephalic, atraumatic.  Neck: Supple without bruits, JVD 8 cm, positive hepatojugular reflux. Lungs:  Resp regular and unlabored, few rales bases. Heart: RRR, S1, S2, no S3, S4, or murmur; no rub. Abdomen: Soft, non-tender, non-distended, BS + x 4.  Extremities: No clubbing, cyanosis, 1+ lower extremity edema, right greater than left with 2+ pedal edema.  Neuro: Alert and oriented X 3. Moves all extremities spontaneously. Psych: Normal affect. Appears anxious at times  LABS: Basic Metabolic Panel:  Recent Labs  04/19/15 0323  NA 140  K 3.7  CL 104  CO2 28  GLUCOSE 199*  BUN 25*    CREATININE 0.79  CALCIUM 8.7*   BNP:  B NATRIURETIC PEPTIDE  Date/Time Value Ref Range Status  04/13/2015 01:55 PM 740.3* 0.0 - 100.0 pg/mL Final  03/30/2015 11:44 PM 630.5* 0.0 - 100.0 pg/mL Final   TELE: A pacing, occasional PVCs, no VT in greater than 24 hours     Current Medications:  . amiodarone  200 mg Oral BID  . furosemide  40 mg Intravenous Once  . furosemide  40 mg Oral Daily  . metoprolol succinate  37.5 mg Oral BID  . potassium chloride  20 mEq Oral Daily  . sodium chloride  3 mL Intravenous Q12H      ASSESSMENT AND PLAN: Principal Problem:   Ventricular tachycardia - s/p [employer] Jude (serial Number [phone]) ICD - Controlled on amiodarone and Toprol-XL - Continue amiodarone 200 mg twice a day until seen in office   Active Problems:   Diabetes, type II, non-insulin-dependent - If not discharged, add sliding scale insulin - Changed to ADA carb mod 2 g sodium diet    Atrial fibrillation - None in over 24 hours - Was on Xarelto prior to admission, resume when medically stable    Acute respiratory failure - Respiratory status is improved, but weight is up 15 pounds since admission - Given Lasix 40 mg IV and reassess    CAD (  coronary artery disease), native coronary artery - 3 vessel - Trial of medical therapy recommended. - Seen by Dr. Roxan Hockey who agrees - Not on ASA due to anticoagulation with Xarelto - On beta blocker, needs a statin - Follow up with cardiology and TCTS  Signed, Rosaria Ferries , PA-C 8:51 AM 04/21/2015 She is significantly volume overloaded. We will transfer her to telemetry for IV diuretics.  I suspect this will require 48-72 hours.  We discussed the importance of low sodium diet.  She had been on carvedilol 9/10. She had an episode of bradycardia and hypotension. The discontinuation of multiple medications.  She's currently on metoprolol. Her blood pressures are variable most recently 98 systolic;???. For now we will hold  off on adding an ACE inhibitor.  We will resume her anticoagulation;   Apparently there is a strong family history of HFpEF adenopathy is HER sister. Further family counseling the genetic testing appropriate as an outpatient. She is in tears. She"feels hopeless". We discussed reactive depression. She is at this time is not interested in considering antidepressives.

## 2015-04-22 LAB — GLUCOSE, CAPILLARY
GLUCOSE-CAPILLARY: 158 mg/dL — AB (ref 65–99)
GLUCOSE-CAPILLARY: 171 mg/dL — AB (ref 65–99)
GLUCOSE-CAPILLARY: 159 mg/dL — AB (ref 65–99)
GLUCOSE-CAPILLARY: 153 mg/dL — AB (ref 65–99)

## 2015-04-22 LAB — BASIC METABOLIC PANEL
ANION GAP: 9 (ref 5–15)
BUN: 21 mg/dL — ABNORMAL HIGH (ref 6–20)
CALCIUM: 9 mg/dL (ref 8.9–10.3)
CO2: 29 mmol/L (ref 22–32)
Chloride: 101 mmol/L (ref 101–111)
Creatinine, Ser: 0.85 mg/dL (ref 0.44–1.00)
GLUCOSE: 160 mg/dL — AB (ref 65–99)
POTASSIUM: 3.7 mmol/L (ref 3.5–5.1)
SODIUM: 139 mmol/L (ref 135–145)

## 2015-04-22 MED ORDER — METOPROLOL SUCCINATE ER 50 MG PO TB24
50.0000 mg | ORAL_TABLET | Freq: Two times a day (BID) | ORAL | Status: DC
  Administered 2015-04-22 – 2015-04-23 (×2): 50 mg via ORAL
  Filled 2015-04-22 (×2): qty 1

## 2015-04-22 MED ORDER — METOPROLOL SUCCINATE ER 25 MG PO TB24
25.0000 mg | ORAL_TABLET | Freq: Once | ORAL | Status: AC
  Administered 2015-04-22: 25 mg via ORAL
  Filled 2015-04-22: qty 1

## 2015-04-22 MED ORDER — MAGNESIUM CITRATE PO SOLN
1.0000 | Freq: Once | ORAL | Status: AC
  Administered 2015-04-22: 1 via ORAL
  Filled 2015-04-22: qty 296

## 2015-04-22 NOTE — Progress Notes (Signed)
Patient Name: [name] Date of Encounter: 04/22/2015  Principal Problem:   Ventricular tachycardia Active Problems:   Diabetes mellitus type 2, noninsulin dependent   Atrial fibrillation   Acute respiratory failure   CAD (coronary artery disease), native coronary artery - 3 vessel   Primary Cardiologist: Dr Mare Ferrari  Patient Profile: 69 yo female w/ hx PAF, DM, mild apical hypertrophic CM, on Xarelto, and recent urosepsis, was admitted 04/13/2015 with VT.    SUBJECTIVE: No chest pain, no palpitations, concerned about lower extremity edema and SOB Aggressive diuresis overnight  seels much better Less edema  Echo EF 55% 8/16  EF cath 45-50% 9/16  OBJECTIVE Filed Vitals:   04/21/15 1345 04/21/15 2110 04/22/15 0556 04/22/15 0618  BP: 145/76 132/81 107/71   Pulse: 62 63 76   Temp: 97.5 F (36.4 C) 97.7 F (36.5 C) 97.9 F (36.6 C)   TempSrc: Oral Oral Oral   Resp: 15 18 19    Height:      Weight:    167 lb 8 oz (75.978 kg)  SpO2: 94% 94% 95%     Intake/Output Summary (Last 24 hours) at 04/22/15 0840 Last data filed at 04/22/15 0830  Gross per 24 hour  Intake    460 ml  Output   3950 ml  Net  -3490 ml   Filed Weights   04/21/15 0411 04/21/15 1248 04/22/15 0618  Weight: 195 lb 12.3 oz (88.8 kg) 171 lb 1.2 oz (77.6 kg) 167 lb 8 oz (75.978 kg)    PHYSICAL EXAM General: Well developed, well nourished, female in no acute distress. Well developed and nourished in no acute distress HENT normal Neck supple with JVP flat Carotids brisk and full without bruits Clear Irregularly irregular rate and rhythm with controll ventricular response, no murmurs or gallops Abd-soft with active BS without hepatomegaly No Clubbing cyanosis 1+edema Skin-warm and dry A & Oriented  Grossly normal sensory and motor function   LABS: Basic Metabolic Panel:  Recent Labs  04/22/15 0325  NA 139  K 3.7  CL 101  CO2 29  GLUCOSE 160*  BUN 21*  CREATININE 0.85    CALCIUM 9.0   BNP:  B NATRIURETIC PEPTIDE  Date/Time Value Ref Range Status  04/13/2015 01:55 PM 740.3* 0.0 - 100.0 pg/mL Final  03/30/2015 11:44 PM 630.5* 0.0 - 100.0 pg/mL Final   TELE: A pacing, occasional PVCs, no VT in greater than 24 hours     Current Medications:  . amiodarone  200 mg Oral BID  . furosemide  40 mg Intravenous BID  . furosemide  40 mg Oral Daily  . insulin aspart  0-15 Units Subcutaneous TID WC  . insulin aspart  0-5 Units Subcutaneous QHS  . metoprolol succinate  25 mg Oral BID  . potassium chloride  20 mEq Oral Daily  . rivaroxaban  20 mg Oral Q supper  . sodium chloride  3 mL Intravenous Q12H      ASSESSMENT AND PLAN: Principal Problem:   Ventricular tachycardia     Diabetes, type II, non-insulin-dependent     Atrial fibrillation     Acute respiratory failure/HFpEF     CAD (coronary artery disease), native coronary artery - 3 vessel - Trial of medical therapy recommended. - Seen by Dr. Roxan Hockey who agrees - Not on ASA due to anticoagulation with Xarelto - On beta blocker, needs a statin - Follow up with cardiology and TCTS "hopelessness" Elevated TSH    Aggressive diuresis -3L  yday Renal function and BP stable Continue amio and Rivaroxaban   Continue metop>> increase to 50 bid  Hold on ARB given near normal LV function  Continue IV diuresis today -->> po in am and prob home Will anticipate DCCV in 10 days or so following amio loading for afib if does not revert spontaneously Will need close follouwp of TSH now on amio PFTS done with modest baseline impairment of DLCO

## 2015-04-22 NOTE — Progress Notes (Signed)
Pt refused bed alarm. Pt was educated. Will continue to monitor  Shanon Rosser

## 2015-04-23 ENCOUNTER — Telehealth: Payer: Self-pay

## 2015-04-23 DIAGNOSIS — I25110 Atherosclerotic heart disease of native coronary artery with unstable angina pectoris: Secondary | ICD-10-CM

## 2015-04-23 LAB — GLUCOSE, CAPILLARY
GLUCOSE-CAPILLARY: 166 mg/dL — AB (ref 65–99)
Glucose-Capillary: 252 mg/dL — ABNORMAL HIGH (ref 65–99)

## 2015-04-23 LAB — BASIC METABOLIC PANEL
Anion gap: 8 (ref 5–15)
BUN: 25 mg/dL — ABNORMAL HIGH (ref 6–20)
CALCIUM: 8.7 mg/dL — AB (ref 8.9–10.3)
CO2: 31 mmol/L (ref 22–32)
CREATININE: 0.99 mg/dL (ref 0.44–1.00)
Chloride: 94 mmol/L — ABNORMAL LOW (ref 101–111)
GFR, EST NON AFRICAN AMERICAN: 58 mL/min — AB (ref >60)
Glucose, Bld: 203 mg/dL — ABNORMAL HIGH (ref 65–99)
Potassium: 4 mmol/L (ref 3.5–5.1)
SODIUM: 133 mmol/L — AB (ref 135–145)

## 2015-04-23 LAB — HEMOGLOBIN A1C
HEMOGLOBIN A1C: 7.5 % — AB (ref 4.8–5.6)
Mean Plasma Glucose: 169 mg/dL

## 2015-04-23 MED ORDER — FUROSEMIDE 40 MG PO TABS
40.0000 mg | ORAL_TABLET | Freq: Two times a day (BID) | ORAL | Status: DC
  Administered 2015-04-23: 40 mg via ORAL
  Filled 2015-04-23: qty 1

## 2015-04-23 MED ORDER — POTASSIUM CHLORIDE CRYS ER 10 MEQ PO TBCR: 20.0000 meq | EXTENDED_RELEASE_TABLET | Freq: Every day | ORAL | Status: DC

## 2015-04-23 MED ORDER — METOPROLOL SUCCINATE ER 50 MG PO TB24: 50.0000 mg | ORAL_TABLET | Freq: Two times a day (BID) | ORAL | Status: DC

## 2015-04-23 MED ORDER — METFORMIN HCL 500 MG PO TABS: 500.0000 mg | ORAL_TABLET | Freq: Two times a day (BID) | ORAL | Status: DC

## 2015-04-23 MED ORDER — AMIODARONE HCL 200 MG PO TABS: 200.0000 mg | ORAL_TABLET | Freq: Two times a day (BID) | ORAL | Status: DC

## 2015-04-23 MED ORDER — AMIODARONE HCL 200 MG PO TABS
200.0000 mg | ORAL_TABLET | Freq: Two times a day (BID) | ORAL | Status: DC
Start: 2015-04-23 — End: 2015-09-13

## 2015-04-23 NOTE — Discharge Summary (Signed)
Discharge Summary   Patient ID: [name],  MRN: [id number], DOB/AGE: 1946-06-11 69 y.o.  Admit date: 04/13/2015 Discharge date: 04/23/2015  Primary Care Provider: Lujean Amel Primary Cardiologist: Dr. Mare Ferrari  Discharge Diagnoses Principal Problem:   Ventricular tachycardia Active Problems:   Diabetes mellitus type 2, noninsulin dependent   Atrial fibrillation   Acute respiratory failure   CAD (coronary artery disease), native coronary artery - 3 vessel   Allergies No Known Allergies  Procedures  CT Coronary Morp W/Cta Cor W/Score W/Ca W/Cm &/Or Wo/Cm 04/16/15 IMPRESSION: 1. Coronary calcium score of 498. 416 is located in the proximal to mid LAD. This was 3 percentile for age and sex matched control.  2. The quality of the study is affected by significant ectopy and motion.  3. Normal coronary origin with right dominance.  4. There is moderate diffuse mixed, predominantly calcified plaque in LAD.  5. There is suspicion for severe stenosis in the mid LAD and in the first diagonal branch. Because the study quality is affected by ectopy and motion a functional study with a stress test is recommended to evaluate for ischemia in this territory.  ICD implant: 04/16/15 [employer] Jude [id number] (serial # [id number] )  CONCLUSIONS:  1. 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  2. Successful ICD implantation.  3. DFT less than or equal to 20 joules.  4. No early apparent complications.   History of Present Illness  The patient is a 69 yr old woman (retired Mining engineer at [employer]) with a [place] significant for atrial fibrillation dating back to at least June 2014 with mild apical hypertrophic cardiomyopathy found at that time. She also has diabetes.  She was recently hospitalized here at [facility] 03/30/15-04/05/15 for urosepsis and near syncope. General cardiology was consulted as she was also found  to be in atrial fibrillation w/ RVR. Per notes, she had been on amiodarone and Xarelto in 2014 but had self discontinued meds on her own. Durring her recent hospitalization, 2D echo revealed normal LVF with EF of 99-35% probable diastolic dysfunction, elevated LVEDP and left atrial pressures, mild concentric and severe focal basal septal hypertrophy, severe left atrial enlargement, mild to moderate RA enlargement, moderate mitral regurgitation, moderately reduced RV systolic function, and moderate to severe tricuspid regurgitation with mildly elevated pulmonary pressures (40 mmHg). No mitral valve prolapse seen. ECG showed atrial fibrillation, nonspecific IVCD, and possible old anteroseptal infarct. Troponins 0.05-->0.07-->0.17-->0.19-->0.24. Chest xray 8/27 suspicious for CHF (treated with lasix).  Given her severe LA enlargement and obesity with elevated filling pressure, it was felt that she would be unable to maintain sinus rhythm for a significant length of time, therefore rate control strategy was elected. She was placed on Toprol-XL, 37.5 mg BID and her rate improved. Xarelto was restarted. She did not undergo any ischemic w/u at that time, as it was felt her abnormal troponins were due to demand ischemia. However, an outpatient stress test was recommended.   She was discharged home on 04/05/15. Since leaving the hospital she has felt poorly, noting weakness, palpitations and dizziness. She presents to the ED 04/13/15 with weakness, palpitations and dizziness.  When EMS arrived she had frequent (q 1 min) runs of Vtach. She was transported to the ED. On arrival, she continued to have VT in the 150s. Labs showed K at 3.5 and at Mg 1.9. She was given 2 mg of Mg and IV amiodarone bolus + infusion was initiated. Despite this, she  continued to have sustained runs of VT. She then became hypotensive with systolic pressure in the 41D. HR in the 160s and symptomatic. Additional bolus of IV amiodarone was given + IV  lidocaine bolus and infusion, along with calcium gluconate and IV Mg.  She then went in afib at rate of 70s. BP is stable. Symptoms improved. She denied any recent h/o CP.   Hospital Course  Her metoprolol changed to coreg. BNP elevated and CXR shows pulmonary edema. Given IV lasix. Continued xarelto for anticoagulation. Admitted by PCCM and seen by EP and general cardiology service.  04/14/15: Transferred to EP service.  Discounted IV lidocaine. Minimally elevated troponin is likely secondary to Vt and does not represented MI. Breathing improved. Cath and ICD implantation possible later next week. Converted spontaneously from atrial flutter to sinus.   04/15/15: SOB much improved. Switched to po amiodarone. Discontinued coreg and metoprolol restarted.   04/16/15: Coronary CT - suspicious of severe stenosis in mid LAD. No chest pain or sob. Sucessful ICD implantation. No driving for 6 months.   04/17/15: Plan made to continue Amiodarone 200mg  bid until seen in office for follow up. Myoview was abnormal and recommended cardiac cath. Last dose of Xarelto.   04/18/15: cath defer to tomorrow as patient took xarelto last night. Had brief episode of afib early morning, converted to SR.   04/19/15: Cardiac catheterization which revealed 3 vessel CAD. LVEDP was 24. No right heart cath was done. Her PFTs showed severely decreased FVC and FEV1, although her pleural effusions could be contributing to that issue. It will be worth having pulmonary evaluate her at some point. CTCA not felt optimal candidate for CABG due to abnormal pulmonary function test.   9:16/16: Doing well on BB and amiodarone. Maintained SR. CTCA recommended medical therapy initially and then revaluate for CABG as outpatient in 2-3 months.   04/21/15: SSI added for hyperglycemia. Resumed Xarelto. No chest pain. IV diuretics for LE edema. Felt hopeless, offered antidepressive, however declined.   04/22/15: HgbA1c 7.5. No chest pain or  palpitations. metoprolol increased to 50mg  bid.  04/22/18: She has been seen by Dr. Angelena Form today and deemed ready for discharge home. All follow-up appointments have been scheduled. Discharge medications are listed below.   TSH was elevated to 6.084 03/30/2015, now on amiodarone, she will need close follow up. She was on SSI for diabetes, discharge on metformin due to cost. She will f/u in community clinic as she did not have an insurance for DM and abnormal pulmonary function test.  She is not on statin therapy, LFT was normal on admission. Consider adding statin as outpatient. Plans for medical management of CAD and possible CABG in several months.    Discharge Vitals Blood pressure 102/89, pulse 75, temperature 98.5 F (36.9 C), temperature source Oral, resp. rate 18, height 5\' 4"  (1.626 m), weight 163 lb 14.4 oz (74.345 kg), SpO2 95 %.  Filed Weights   04/21/15 1248 04/22/15 0618 04/23/15 0815  Weight: 171 lb 1.2 oz (77.6 kg) 167 lb 8 oz (75.978 kg) 163 lb 14.4 oz (74.345 kg)    Labs  CBC No results for input(s): WBC, NEUTROABS, HGB, HCT, MCV, PLT in the last 72 hours. Basic Metabolic Panel  Recent Labs  04/22/15 0325 04/23/15 0316  NA 139 133*  K 3.7 4.0  CL 101 94*  CO2 29 31  GLUCOSE 160* 203*  BUN 21* 25*  CREATININE 0.85 0.99  CALCIUM 9.0 8.7*   Hemoglobin A1C  Recent  Labs  04/22/15 0325  HGBA1C 7.5*    Disposition  Pt is being discharged home today in good condition.  Follow-up Plans & Appointments  Follow-up Information    Follow up with Melina Copa, PA-C On 04/26/2015.   Specialties:  Cardiology, Radiology   Why:  at 10:15AM and would check at 11:00 am   Contact information:   [address] [place] [address] [phone]       Follow up with Loris. Go on 04/27/2015.   Specialty:  Internal Medicine   Why:  at 11:15am for an appointment with Dr Jarold Song. - or diabetes manament- HgbA1c 7.8  03/31/15 and midely abnormal PFT -   Contact information:   [address] [id number] [place] [phone]          Discharge Instructions    Call MD for:  redness, tenderness, or signs of infection (pain, swelling, redness, odor or green/yellow discharge around incision site)    Complete by:  As directed      Diet - low sodium heart healthy    Complete by:  As directed      Increase activity slowly    Complete by:  As directed            F/u Labs/Studies: as above  Discharge Medications    Medication List    TAKE these medications        amiodarone 200 MG tablet  Commonly known as:  PACERONE  Take 1 tablet (200 mg total) by mouth 2 (two) times daily.     cephALEXin 500 MG capsule  Commonly known as:  KEFLEX  Take 1 capsule (500 mg total) by mouth every 8 (eight) hours.     furosemide 40 MG tablet  Commonly known as:  LASIX  Take 1 tablet (40 mg total) by mouth daily.     metFORMIN 500 MG tablet  Commonly known as:  GLUCOPHAGE  Take 1 tablet (500 mg total) by mouth 2 (two) times daily with a meal.     metoprolol succinate 50 MG 24 hr tablet  Commonly known as:  TOPROL-XL  Take 1 tablet (50 mg total) by mouth 2 (two) times daily.     potassium chloride 10 MEQ tablet  Commonly known as:  K-DUR,KLOR-CON  Take 2 tablets (20 mEq total) by mouth daily.     rivaroxaban 20 MG Tabs tablet  Commonly known as:  XARELTO  Take 1 tablet (20 mg total) by mouth daily.        Duration of Discharge Encounter   Greater than 30 minutes including physician time.  Signed, Bhagat,Bhavinkumar PA-C 04/23/2015, 2:48 PM

## 2015-04-23 NOTE — Telephone Encounter (Signed)
Transitional Care Clinic Care Coordination Note:  Met with the patient prior to her discharge  Admit date:  04/13/2015   Discharge date: 04/23/2015 Discharge Disposition: home Patient contact: cell [phone] Emergency contact(s): Celedonio Savage ( grandson ) # [phone]  This Case Manager reviewed patient's EMR and determined patient would benefit from post-discharge medical management and chronic care management services through the [facility]. Patient has a history of ventricular tachycardia, DM, atrial fibrillation, CAD, hypertrophic cardiomyopathy, acute respiratory failure, s/p ICD implant. She had 2 hospital admissions in the past 2 months.  This Case Manager met with patient to discuss the services and medical management that can be provided at the [facility]. Patient verbalized understanding and agreed to receive post-discharge care at the [facility].   Patient scheduled for Transitional Care appointment on 04/27/2015 at 69 with Dr [facility] information and appointment time provided to patient. Appointment information also placed on AVS.  Assessment:       Home Environment: She lives with her daughter who is disabled and her grandson in a private home. She noted that there are 2 floors but she never goes up to the second floor. Her grandson stays on the second floor.         Support System: her daughter and grandson as well as her aunt and uncle.        Level of functioning: independent       Home DME: No DME at home. A RW was ordered for her.        Home care services: (services arranged prior to discharge or new services after discharge) none       Transportation:  She said that her grandson drives and can drive her to her appointments.         Food/Nutrition: (ability to afford, access, use of any community resources). She is able to access the nutrition that she needs.  She noted that her daughter receives foods stamps but may soon  loose them.  She also noted that she has not been shopping in over 6 months.  Her daughter does the food shopping and she ( patient ) does the meal prep.         Medications: (ability to afford, access, compliance, Pharmacy used)  She said that she has no insurance coverage for her medications. She noted that her aunt and uncle bought her medications for her after her last hospitalization and they cost > $500.  She is very concerned about the cost of medications and is appreciative of the pharmacy services at [facility].         Identified Barriers: No part D coverage for her medications,  Potential medication non compliance d/t cost of medications., recent new diagnosis, has not had regular PCP follow up.  Financial concerns about her medical bills and outpatient copays.          PCP (Name, office location, phone number): She said that she has not been to a PCP in > 2 years and is interested in establishing care at [facility] after the TCC program is completed. She said that she has been concerned about the copays that she will owe when she has to prioritize her medical appointments.   Patient Education: Instructed her about the services provided at the [facility] including, primary medical care, social work, pharmacy assistance and financial counseling.             Arranged services:  Services communicated to [employer], CM.  She spoke to [place], [place] who was discharging the patient to confirm that the patient will receive her transitional care services at the [facility]

## 2015-04-23 NOTE — Discharge Instructions (Signed)
No driving for 6 months. No lifting over 5 lbs for 1 week. No sexual activity for 1 week.  Keep procedure site clean & dry. If you notice increased pain, swelling, bleeding or pus, call/return!  You may shower, but no soaking baths/hot tubs/pools for 1 week.      Supplemental Discharge Instructions for  Pacemaker/Defibrillator Patients  Activity No heavy lifting or vigorous activity with your left/right arm for 6 to 8 weeks.  Do not raise your left/right arm above your head for one week.  Gradually raise your affected arm as drawn below.           __      04/20/15                    04/21/15                  04/22/15                    04/23/15  NO DRIVING for  6 months  WOUND CARE - Keep the wound area clean and dry.  Do not get this area wet for one week. No showers for one week  - The tape/steri-strips on your wound will fall off; do not pull them off.  No bandage is needed on the site.  DO  NOT apply any creams, oils, or ointments to the wound area. - If you notice any drainage or discharge from the wound, any swelling or bruising at the site, or you develop a fever > 101? F after you are discharged home, call the office at once.  Special Instructions - You are still able to use cellular telephones; use the ear opposite the side where you have your pacemaker/defibrillator.  Avoid carrying your cellular phone near your device. - When traveling through airports, show security personnel your identification card to avoid being screened in the metal detectors.  Ask the security personnel to use the hand wand. - Avoid arc welding equipment, MRI testing (magnetic resonance imaging), TENS units (transcutaneous nerve stimulators).  Call the office for questions about other devices. - Avoid electrical appliances that are in poor condition or are not properly grounded. - Microwave ovens are safe to be near or to operate.  Additional information for defibrillator patients should your device go  off: - If your device goes off ONCE and you feel fine afterward, notify the device clinic nurses. - If your device goes off ONCE and you do not feel well afterward, call 911. - If your device goes off TWICE, call 911. - If your device goes off THREE times in one day, call 911.  DO NOT DRIVE YOURSELF OR A FAMILY MEMBER WITH A DEFIBRILLATOR TO THE HOSPITAL--CALL 911.    Cardioverter Defibrillator Implantation An implantable cardioverter defibrillator (ICD) is a small, lightweight, battery-powered device that is placed (implanted) under the skin in the chest or abdomen. Your caregiver may prescribe an ICD if:  You have had an irregular heart rhythm (arrhythmia) that originated in the lower chambers of the heart (ventricles).  Your heart has been damaged by a disease (such as coronary artery disease) or heart condition (such as a heart attack). An ICD consists of a battery that lasts several years, a small computer called a pulse generator, and wires called leads that go into the heart. It is used to detect and correct two dangerous arrhythmias: a rapid heart rhythm (tachycardia) and an arrhythmia in which the ventricles contract in  an uncoordinated way (fibrillation). When an ICD detects tachycardia, it sends an electrical signal to the heart that restores the heartbeat to normal (cardioversion). This signal is usually painless. If cardioversion does not work or if the ICD detects fibrillation, it delivers a small electrical shock to the heart (defibrillation) to restart the heart. The shock may feel like a strong jolt in the chest.ICDs may be programmed to correct other problems. Sometimes, ICDs are programmed to act as another type of implantable device called a pacemaker. Pacemakers are used to treat a slow heartbeat (bradycardia). LET YOUR CAREGIVER KNOW ABOUT:  Any allergies you have.  All medicines you are taking, including vitamins, herbs, eyedrops, and over-the-counter medicines and  creams.  Previous problems you or members of your family have had with the use of anesthetics.  Any blood disorders you have had.  Other health problems you have. RISKS AND COMPLICATIONS Generally, the procedure to implant an ICD is safe. However, as with any surgical procedure, complications can occur. Possible complications associated with implanting an ICD include:  Swelling, bleeding, or bruising at the site where the ICD was implanted.  Infection at the site where the ICD was implanted.  A reaction to medicine used during the procedure.  Nerve, heart, or blood vessel damage.  Blood clots. BEFORE THE PROCEDURE  You may need to have blood tests, heart tests, or a chest X-ray done before the day of the procedure.  Ask your caregiver about changing or stopping your regular medicines.  Make plans to have someone drive you home. You may need to stay in the hospital overnight after the procedure.  Stop smoking at least 24 hours before the procedure.  Take a bath or shower the night before the procedure. You may need to scrub your chest or abdomen with a special type of soap.  Do not eat or drink before your procedure for as long as directed by your caregiver. Ask if it is okay to take any needed medicine with a small sip of water. PROCEDURE  The procedure to implant an ICD in your chest or abdomen is usually done at a hospital in a room that has a large X-ray machine called a fluoroscope. The machine will be above you during the procedure. It will help your caregiver see your heart during the procedure. Implanting an ICD usually takes 1-3 hours. Before the procedure:   Small monitors will be put on your body. They will be used to check your heart, blood pressure, and oxygen level.  A needle will be put into a vein in your hand or arm. This is called an intravenous (IV) access tube. Fluids and medicine will flow directly into your body through the IV tube.  Your chest or abdomen  will be cleaned with a germ-killing (antiseptic) solution. The area may be shaved.  You may be given medicine to help you relax (sedative).  You will be given a medicine called a local anesthetic. This medicine will make the surgical site numb while the ICD is implanted. You will be sleepy but awake during the procedure. After you are numb the procedure will begin. The caregiver will:  Make a small cut (incision). This will make a pocket deep under your skin that will hold the pulse generator.  Guide the leads through a large blood vessel into your heart and attach them to the heart muscles. Depending on the ICD, the leads may go into one ventricle or they may go to both ventricles and into  an upper chamber of the heart (atrium).  Test the ICD.  Close the incision with stitches, glue, or staples. AFTER THE PROCEDURE  You may feel pain. Some pain is normal. It may last a few days.  You may stay in a recovery area until the local anesthetic has worn off. Your blood pressure and pulse will be checked often. You will be taken to a room where your heart will be monitored.  A chest X-ray will be taken. This is done to check that the cardioverter defibrillator is in the right place.  You may stay in the hospital overnight.  A slight bump may be seen over the skin where the ICD was placed. Sometimes, it is possible to feel the ICD under the skin. This is normal.  In the months and years afterward, your caregiver will check the device, the leads, and the battery every few months. Eventually, when the battery is low, the ICD will be replaced. Document Released: 04/12/2002 Document Revised: 05/11/2013 Document Reviewed: 08/09/2012 [facility] Patient Information 2015 [place], [place]. This information is not intended to replace advice given to you by your health care provider. Make sure you discuss any questions you have with your health care provider.  Coronary Artery Disease Coronary artery disease  (CAD) is a process in which the heart (coronary) arteries narrow or become blocked from the development of atherosclerosis. Atherosclerosis is a disease in which plaque builds up on the inside of the heart arteries (coronary arteries). Plaque is made up of fats (lipids), cholesterol, calcium, and fibrous tissue. CAD can lead to a heart attack (myocardial infarction, MI). An MI can lead to heart failure, cardiogenic shock, or sudden cardiac death. CAD can cause an MI through:  Plaque buildup that can severely narrow or block the coronary arteries and diminish blood flow.  Plaque that can become unstable and "rupture." Unstable plaque that ruptures within a coronary artery can form a clot and cause a sudden (acute) blockage. RISK FACTORS Many risk factors contribute to the development of CAD. These include:  High cholesterol (dyslipidemia) levels.  High blood pressure (hypertension).  Smoking.  Diabetes.  Age.  Gender. Men can develop CAD earlier in life than women.  Family history.  Inactivity or lack of regular physical or aerobic exercise.  A diet high in saturated fats.  Chronic kidney disease. SYMPTOMS  When a coronary artery is narrowed or blocked, an MI can occur. MI symptoms can include:  Chest pain (agina). Angina can occur by itself or it can also occur with pain in the neck, arm, jaw, or in the upper, middle back (mid-scapular pain).  Profuse sweating (diaphoresis) without physical activity or movement.  Shortness of breath (dyspnea).  Irregular heartbeats (palpitations) that feel like your heart is skipping beats or is beating very fast.  Nausea.  Epigastric pain. Epigastric pain may occur as "heartburn."  Tiredness (malaise). This can especially be present in the elderly. Women can have different (atypical) symptoms other than classic angina.  DIAGNOSIS  The diagnosis of CAD may include:  An electrocardiography (ECG). An ECG does not diagnose CAD, but it is  usefull in the detection of a sudden (acute) MI or as a marker for a previous MI. Depending on which heart (coronary) artery may be blocked, an ECG may not pick up an MI pattern.  Exercise stress test. A stress test can be performed at rest for people who are unable to do an exercise stress test. A stress test will only be abnormal if  one or more of the large coronary arteries is significantly blocked.  Blood tests. Tests may include samples to detect heart muscle damage (such as troponin levels). Other tests may include cholesterol checks and an inflammation test (high-sensitivity C-reactive protein, hs-CRP).  Coronary angiography.  Screening people who have peripheral vascular disease (PAD). These people often times have CAD. TREATMENT  The treatment of CAD includes the following:  Lifestyle changes such as:  Following a heart-healthy diet. A registered dietitian can you help educate you on healthy food options and changes.  Quiting smoking.  Following an exercise program approved by your caregiver.  Maintaining a healthy weight. Lose weight as approved by your caregiver.  Medicines to help control your blood pressure, cholesterol level, angina, and blood clotting. Medicines may include beta-blockers, ACE inhibitors, statins, nitrates, and anti-platelet medicines.  If you have a heart stent and are taking anti-platelet medicine, it is important to not suddenly stop taking this medicine. Suddenly stopping anti-platelet medicine can result in an MI. Talk with your caregiver before stopping medicine or if you cannot afford your medicine.  If the coronary arteries are significantly blocked, surgery may be needed. This can include:  Percutaneous coronary intervention (PCI) with or without stent placement.  Coronary artery bypass graft surgery (CABG). SEEK IMMEDIATE MEDICAL CARE IF:   You develop MI symptoms. This is a medical emergency. Get help at once. Call your local emergency  service (911 in the [place] immediately. Do not drive yourself to the clinic or hospital. MI symptoms can include:  Angina or pain that occurs in the neck, arm, jaw, or in the upper middle back.  Profuse sweating without cause.  Shortness of breath or difficulty breathing without cause.  Unexplained nausea or epigastric pain that feels like heartburn. Document Released: 10/13/2011 Document Reviewed: 11/22/2013 [facility] Patient Information 2015 [place]. This information is not intended to replace advice given to you by your health care provider. Make sure you discuss any questions you have with your health care provider.   Radial Site Care Refer to this sheet in the next few weeks. These instructions provide you with information on caring for yourself after your procedure. Your caregiver may also give you more specific instructions. Your treatment has been planned according to current medical practices, but problems sometimes occur. Call your caregiver if you have any problems or questions after your procedure. HOME CARE INSTRUCTIONS  You may shower the day after the procedure.Remove the bandage (dressing) and gently wash the site with plain soap and water.Gently pat the site dry.  Do not apply powder or lotion to the site.  Do not submerge the affected site in water for 3 to 5 days.  Inspect the site at least twice daily.  Do not flex or bend the affected arm for 24 hours.  No lifting over 5 pounds (2.3 kg) for 5 days after your procedure.  Do not drive home if you are discharged the same day of the procedure. Have someone else drive you.  You may drive 24 hours after the procedure unless otherwise instructed by your caregiver.  Do not operate machinery or power tools for 24 hours.  A responsible adult should be with you for the first 24 hours after you arrive home. What to expect:  Any bruising will usually fade within 1 to 2 weeks.  Blood that collects in the tissue  (hematoma) may be painful to the touch. It should usually decrease in size and tenderness within 1 to 2 weeks. SEEK  IMMEDIATE MEDICAL CARE IF:  You have unusual pain at the radial site.  You have redness, warmth, swelling, or pain at the radial site.  You have drainage (other than a small amount of blood on the dressing).  You have chills.  You have a fever or persistent symptoms for more than 72 hours.  You have a fever and your symptoms suddenly get worse.  Your arm becomes pale, cool, tingly, or numb.  You have heavy bleeding from the site. Hold pressure on the site. Document Released: 08/23/2010 Document Revised: 10/13/2011 Document Reviewed: 08/23/2010 [facility] Patient Information 2015 [place], [place]. This information is not intended to replace advice given to you by your health care provider. Make sure you discuss any questions you have with your health care provider.

## 2015-04-23 NOTE — Telephone Encounter (Signed)
Call received from [place], CM requesting a hospital follow up appointment for the patient.  An appointment has been scheduled for 04/27/15 @ 11 w/ Dr Jarold Song and the visit information was added to the AVS This CM to meet with the patient prior to discharge to discuss the [facility] at [facility].   Update provided to B. Adelfa Koh, CM

## 2015-04-23 NOTE — Progress Notes (Signed)
CARDIAC REHAB PHASE I   PRE:  Rate/Rhythm: 78 paced, underlying a fib  BP:  Sitting: 107/62        SaO2: 95 RA  MODE:  Ambulation: 320 ft   POST:  Rate/Rhythm: 79 paced, underlying a fib  BP:  Sitting: 96/61         SaO2: 95 RA  Pt sitting up in bed, states she has felt dizzy off and on this morning and has had a poor appetite. Pt ambulated 320 ft on RA (sats 92-95% on RA during ambulation), rolling walker, mildly unsteady gait at baseline, assist x 1, tolerated well. Pt denies dizziness, CP, DOE, c/o feeling weak in her legs, brief standing rest x3. Pt states she does not have a rolling walker at home, pt would benefit from RW for gait stablility. Pt also states she has had trouble affording her medication and is concerned she will not be able to afford her xarelto as she is on a fixed income. CM notified. Reviewed education for CAD management including risk factors, exercise guidelines, ntg, daily weights, sodium restrictions, heart healthy and diabetes diet. Pt verbalized understanding. Pt not appropriate for phase 2 referral at this time unless given diagnosis of stable angina. Pt to edge of bed after walk, call bell within reach.   [phone]  Lenna Sciara, RN, BSN 04/23/2015 10:28 AM

## 2015-04-23 NOTE — Telephone Encounter (Signed)
TCM-still in hospital as of 9/19

## 2015-04-23 NOTE — Care Management Note (Addendum)
Case Management Note  Patient Details  Name: [name] MRN: [id number] Date of Birth: [birth year]  Subjective/Objective: Pt admitted for Ventricular tachycardia. Pt is from home with disabled daughter and grandson. Pt has Medicare, however no part D Rx coverage. Pt is paying out of pocket for all medications. Per pt her whole check goes to house payment. Pt is willing to change providers in order to get cheaper medications. Pt states her PCP is at [facility]- her co pay is $20.00. CM did discuss with pt in regards to the [facility]/ Pharmacy is onsite with medications ranging from $4.00-$10.00. This may be better option for pt. CM did call [facility] Liaison to see if hospital f/u can be established.           Action/Plan: Pt in need of medication assistance. Pt uses [employer]. CM did call [place] to check prices on medications. [facility] Amiodarone- $36.27, Clinic Price: $10.00, Metoprol Toprol XL 50 mg $57.13 Clinic Pharmacy: $10.00, Potassium Chloride $19.53 Clinic Pharmacy: $10.00, -Xarelto is in stock at the [facility]. Pt will need 30 day free Rx no refills. Patient assistance form can be initiated at he office for possible year supply free. If pt needs Glucose meter / test strips/ lancets pt will need 3 separate Rx's. To order in [facility] Meter # [phone] which is free, Strips total of 50 count # [id number], Lancets which are $2.00 # [id number]. Pt will benefit from RW as well. Please place order. Agreeable to DME to be delivered from [facility]. No further needs from CM at this time.   Expected Discharge Date:                  Expected Discharge Plan:  Home/Self Care  In-House Referral:     Discharge planning Services  CM Consult, Follow-up appt scheduled, [facility], Medication Assistance  Post Acute Care Choice:  Durable Medical Equipment Choice offered to:  Patient  DME Arranged:  Walker rolling DME Agency:   [place] Arranged:  NA [place] Agency:  NA  Status of Service:  Completed, signed off  Medicare Important Message Given:  Yes-third notification given Date Medicare IM Given:    Medicare IM give by:    Date Additional Medicare IM Given:    Additional Medicare Important Message give by:     If discussed at [name] of Stay Meetings, dates discussed:    Additional Comments: 1240 04-23-15 Jacqlyn Krauss, RN,BSN [phone] Pt is appropriate for services to the [facility] via [facility]. Opal Sidles to speak with pt before d/c. Staff RN aware. No further needs from CM at this time.    Bethena Roys, RN 04/23/2015, 12:03 PM

## 2015-04-23 NOTE — Progress Notes (Signed)
Patient Name: [name] Date of Encounter: 04/23/2015  Principal Problem:  Ventricular tachycardia Active Problems:  Diabetes mellitus type 2, noninsulin dependent  Atrial fibrillation  Acute respiratory failure  CAD (coronary artery disease), native coronary artery - 3 vessel   Primary Cardiologist: Dr Mare Ferrari  Patient Profile: 69 yo female w/ hx PAF, DM, mild apical hypertrophic CM, on Xarelto, and recent urosepsis, was admitted 04/13/2015 with VT.  Echo EF 55% 8/16 EF cath 45-50% 9/16  SUBJECTIVE  Feeling well. No chest pain, sob or palpitations.   CURRENT MEDS . amiodarone  200 mg Oral BID  . furosemide  40 mg Intravenous BID  . furosemide  40 mg Oral Daily  . insulin aspart  0-15 Units Subcutaneous TID WC  . insulin aspart  0-5 Units Subcutaneous QHS  . metoprolol succinate  50 mg Oral BID  . potassium chloride  20 mEq Oral Daily  . rivaroxaban  20 mg Oral Q supper  . sodium chloride  3 mL Intravenous Q12H    OBJECTIVE  Filed Vitals:   04/22/15 1535 04/22/15 1724 04/22/15 2005 04/23/15 0429  BP: 99/60 113/66 109/58 108/54  Pulse: 76 85  82  Temp:   97.7 F (36.5 C) 98.5 F (36.9 C)  TempSrc:   Oral Oral  Resp:   18 18  Height:      Weight:      SpO2: 96%  94% 95%    Intake/Output Summary (Last 24 hours) at 04/23/15 0815 Last data filed at 04/22/15 1750  Gross per 24 hour  Intake    720 ml  Output   1500 ml  Net   -780 ml   Filed Weights   04/21/15 0411 04/21/15 1248 04/22/15 0618  Weight: 195 lb 12.3 oz (88.8 kg) 171 lb 1.2 oz (77.6 kg) 167 lb 8 oz (75.978 kg)    PHYSICAL EXAM  General: Pleasant, NAD. Neuro: Alert and oriented X 3. Moves all extremities spontaneously. Psych: Normal affect. HEENT:  Normal  Neck: Supple without bruits or JVD. Lungs:  Resp regular and unlabored, CTA. Heart: irregular no s3, s4, or murmurs. Abdomen: Soft, non-tender, non-distended, BS + x 4.  Extremities: No clubbing, cyanosis. Trace edema.  DP/PT/Radials 2+ and equal bilaterally.  Accessory Clinical Findings  CBC No results for input(s): WBC, NEUTROABS, HGB, HCT, MCV, PLT in the last 72 hours. Basic Metabolic Panel  Recent Labs  04/22/15 0325 04/23/15 0316  NA 139 133*  K 3.7 4.0  CL 101 94*  CO2 29 31  GLUCOSE 160* 203*  BUN 21* 25*  CREATININE 0.85 0.99  CALCIUM 9.0 8.7*    TELE  Afib with paced rhythm  Radiology/Studies  Dg Chest 2 View  04/17/2015   CLINICAL DATA:  Cardiac pacer.  EXAM: CHEST  2 VIEW  COMPARISON:  CT 04/16/2015.  Chest x-ray 04/14/2015.  FINDINGS: Mediastinum and hilar structures normal. Cardiac pacer with lead tips in right atrium right ventricle . Cardiomegaly with pulmonary vascular prominence and interstitial prominence bilateral effusions. Findings consistent congestive heart failure. Findings have improved scratched from prior study of 04/14/2015  IMPRESSION: Congestive heart failure with pulmonary interstitial edema and bilateral effusions. Slight improvement from prior exam. Cardiac pacer in stable position.   Electronically Signed   By: Marcello Moores  Register   On: 04/17/2015 09:21   Nm Myocar Multi W/spect W/wall Motion / Ef  04/17/2015   CLINICAL DATA:  Shortness of breath, V-tach, left atrial enlargement, mitral valve prolapse, hypertrophic cardiomyopathy  EXAM:  MYOCARDIAL IMAGING WITH SPECT (REST AND PHARMACOLOGIC-STRESS)  GATED LEFT VENTRICULAR WALL MOTION STUDY  LEFT VENTRICULAR EJECTION FRACTION  TECHNIQUE: Standard myocardial SPECT imaging was performed after resting intravenous injection of 10 mCi Tc-42m sestamibi. Subsequently, intravenous infusion of Lexiscan was performed under the supervision of the Cardiology staff. At peak effect of the drug, 30 mCi Tc-66m sestamibi was injected intravenously and standard myocardial SPECT imaging was performed. Quantitative gated imaging was also performed to evaluate left ventricular wall motion, and estimate left ventricular ejection fraction.   COMPARISON:  None.  FINDINGS: Perfusion: Small to medium sized, mild severity fixed defect along the inferolateral wall, suspicious for prior infarct, with associated mild reversible peri-lesional ischemia. Additional small, mild reversible defect along the anterior wall.  Wall Motion: Hypokinesia involving the inferolateral wall. No left ventricular dilation.  Left Ventricular Ejection Fraction: 34 %  End diastolic volume 89 ml  End systolic volume 58 ml  IMPRESSION: 1. Small, mild reversible defect along the anterior wall of the left ventricle, possibly reflecting acute ischemia. Additional small to medium-sized fixed defect involving the inferolateral wall, suggesting prior infarct, but with possible mild peri-lesional ischemia.  2. Associated hypokinesia involving the inferolateral wall of the left ventricle.  3.  Decreased left ventricular ejection fraction of 34%.  4. High-risk stress test findings*.  *2012 Appropriate Use Criteria for Coronary Revascularization Focused Update: J Am Coll Cardiol. 8315;17(6):160-737. http://content.airportbarriers.com.aspx?articleid=1201161  These results will be called to the ordering clinician or representative by the Radiologist Assistant, and communication documented in the PACS or zVision Dashboard.   Electronically Signed   By: Julian Hy M.D.   On: 04/17/2015 15:29   Ct Coronary Morp W/cta Cor W/score W/ca W/cm &/or Wo/cm  04/16/2015   ADDENDUM REPORT: 04/16/2015 17:56  CLINICAL DATA:  69 year old female with hypertrophic cardiomyopathy who presented with VT storm. Evaluate for obstructive CAD.  EXAM: Cardiac/Coronary  CT  TECHNIQUE: The patient was scanned on a Philips 256 scanner.  FINDINGS: A 120 kV prospective scan was triggered in the descending thoracic aorta at 111 HU's. Axial non-contrast 3 mm slices were carried out through the heart. The data set was analyzed on a dedicated work station and scored using the [place]. Gantry rotation speed was  270 msecs and collimation was .9 mm. No beta blockade and 0.4 mg of sl NTG was given. The 3D data set was reconstructed in 5% intervals of the 67-82 % of the R-R cycle. Diastolic phases were analyzed on a dedicated work station using MPR, MIP and VRT modes. The patient received 80 cc of contrast.  Aorta:  Normal caliber, mild diffuse calcifications.  No dissection.  Aortic Valve:  Trileaflet.  Minimal calcifications of the leaflets.  Coronary Arteries:  Normal origin.  Right dominance.  The quality is affected by significant ectopy during acquisition resulting in motion.  RCA is a large dominant vessel. There appears to be diffuse mixed plaque but no severe stenosis.  Left main is a long and large vessel that has minimal calcified plaque in its distal portion associated with 0-25% stenosis.  LAD is a large vessel that has severe diffuse calcified plaque predominantly in the proximal and mid segment. There is moderate calcified lesion in the proximal segment associated with 50-69% stenosis. In the mid LAD there are two segments of mixed, predominantly calcified plaque with possible stenosis > stenosis. The more accurate estimate not possible secondary to motion artifact.  There is a diagonal branch originating of mid LAD with possible severe stenosis in  the proximal segment.  A small ramus intermedius has mild calcified plaque in the proximal segment.  LCX artery is medium caliber vessel that gives rise to one small obtuse marginal branch. A minimal calcified plaque associated with 0-25% stenosis is present in the proximal segment.  IMPRESSION: 1. Coronary calcium score of 498. 416 is located in the proximal to mid LAD. This was 19 percentile for age and sex matched control.  2. The quality of the study is affected by significant ectopy and motion.  3. Normal coronary origin with right dominance.  4. There is moderate diffuse mixed, predominantly calcified plaque in LAD.  5. There is suspicion for severe stenosis in  the mid LAD and in the first diagonal branch. Because the study quality is affected by ectopy and motion a functional study with a stress test is recommended to evaluate for ischemia in this territory.  Ena Dawley   Electronically Signed   By: Ena Dawley   On: 04/16/2015 17:56   04/16/2015   EXAM: OVER-READ INTERPRETATION  CT CHEST  The following report is an over-read performed by radiologist Dr. Forest Gleason [facility] on 04/16/2015. This over-read does not include interpretation of cardiac or coronary anatomy or pathology. The CTA interpretation by the cardiologist is attached.  COMPARISON:  Plain film of 04/14/2015  FINDINGS: Mediastinum/Nodes: Pulmonary artery enlargement, outflow tract 3.8 cm. precarinal node measures 1.2 cm on image 3 of series 504. No gross hilar adenopathy.  Lungs/Pleura: Small right larger than left pleural effusions. Heterogeneous ground-glass opacities throughout both lungs with interlobular septal thickening. Bibasilar dependent airspace disease which is likely atelectasis, including within the posterior lingula.  Upper abdomen: Normal imaged portions of the liver, spleen, stomach.  Musculoskeletal: No acute osseous abnormality.  IMPRESSION: 1. Congestive heart failure, with bilateral pleural effusions, septal thickening, and ground-glass opacities. 2. Pulmonary artery enlargement suggests pulmonary arterial hypertension. 3. Mild precarinal adenopathy. This could also relate to congestive heart failure. Standard chest CT follow-up at 3 months could confirm stability or resolution.  Electronically Signed: By: Abigail Miyamoto M.D. On: 04/16/2015 15:04   Dg Chest Port 1 View  04/14/2015   CLINICAL DATA:  Acute respiratory failure.  Diabetes mellitus.  EXAM: PORTABLE CHEST - 1 VIEW  COMPARISON:  04/13/2015.  FINDINGS: Cardiomegaly persists. BILATERAL pulmonary edema is redemonstrated, perhaps slightly improved with regard interstitial prominence. Large LEFT pleural  effusion is now observed. Overlying telemetry leads.  IMPRESSION: Slight improvement in pulmonary edema, with increasing LEFT pleural effusion. Cardiomegaly.   Electronically Signed   By: Staci Righter M.D.   On: 04/14/2015 10:26   Dg Chest Port 1 View  04/13/2015   CLINICAL DATA:  Chest pain  EXAM: PORTABLE CHEST - 1 VIEW  COMPARISON:  03/31/2015  FINDINGS: Cardiac enlargement. Diffuse bilateral airspace disease has progressed since the prior study most consistent with pulmonary edema. Small bilateral pleural effusions with bibasilar atelectasis.  IMPRESSION: Congestive heart failure with bilateral edema and small pleural effusions. Worsening of edema since the prior study.   Electronically Signed   By: Franchot Gallo M.D.   On: 04/13/2015 14:02   Dg Chest Port 1 View  03/31/2015   CLINICAL DATA:  Weakness and shortness of breath. Near syncope. Bilateral lower extremity swelling.  EXAM: PORTABLE CHEST - 1 VIEW  COMPARISON:  01/01/2013  FINDINGS: Cardiomegaly is again seen. There is progressive interstitial and perihilar opacities concerning for pulmonary edema. Mild atelectasis at the right lung base. No confluent airspace disease. Questionable blunting  of left costophrenic angle. No pneumothorax.  IMPRESSION: Findings suspicious for CHF.   Electronically Signed   By: Jeb Levering M.D.   On: 03/31/2015 01:21    ASSESSMENT AND PLAN   1. Ventricular tachycardia - s/p St. Jude (serial Number [id number]) ICD - Controlled on amiodarone and Toprol-XL - Continue amiodarone 200 mg twice a day until seen in office    2. Diabetes, type II, non-insulin-dependent - HgbA1c 7.8  03/31/15 - not on any medication at home, currently on SSI--> close follow up with PCP on discharge  3. Atrial fibrillation - Currently in Afib with paced rhythm, rate controlled - Continue Xarelto - Will anticipate DCCV in 10 days or so following amio loading for afib if does not revert spontaneously  4. Acute respiratory  failure - Respiratory status is improved -  weight is down 17lb pounds since admission (180-->163lb) -Currently she is on IV lasix 40mg  BID and also on po lasix 40mg  qd (thankfully she is not getting it). Will switch her to po lasix 40mg  BID today, MD to decided discharge dose. - PFTS done with modest baseline impairment of DLCO---> f/uwith PCP  5. CAD (coronary artery disease), native coronary artery - 3 vessel - Trial of medical therapy recommended. - Seen by Dr. Roxan Hockey who agrees - Not on ASA due to anticoagulation with Xarelto - On beta blocker - She is not on statin therapy, LFT was normal on admission. Consider adding statin - Follow up with cardiology and TCTS  6. Elevated TSH - Will need close follouwp of TSH now on amio  Dispo: Likely discharge today  Signed, Bhagat,Bhavinkumar PA-C   I have personally seen and examined this patient with [employer], PA-C. I agree with the assessment and plan as outlined above. She is s/p ICD for VT. She has severe three vessel CAD and has been seen by CT surgery. Plans for medical management of CAD and possible CABG in several months. She is being anti-coagulated with Xarelto. She is being loaded with amiodarone. She has a f/u this week with Melina Copa on Thursday. Would keep this appt. Check with EP to see about care of ICD surgical wound. Discharge home today. She wants to follow with [facility] long term.   MCALHANY,CHRISTOPHER 04/23/2015  11:16 AM

## 2015-04-23 NOTE — Care Management Important Message (Signed)
Important Message  Patient Details  Name: [name] MRN: [id number] Date of Birth: May 31, 1946   Medicare Important Message Given:  Yes-fourth notification given    Nathen May 04/23/2015, 4:48 PM

## 2015-04-24 ENCOUNTER — Telehealth: Payer: Self-pay

## 2015-04-24 NOTE — Telephone Encounter (Signed)
Patient contacted regarding discharge from [facility] on April 23, 2015.  Patient understands to follow up with provider Melina Copa, PA-C on April 26, 2015 at 10:15AM at [facility]. Patient understands discharge instructions? yes Patient understands medications and regiment? yes Patient understands to bring all medications to this visit? yes

## 2015-04-24 NOTE — Telephone Encounter (Signed)
[facility]itional [facility] Clinic Post-discharge Follow-Up Phone Call:  Attempted to contact the patient to check on her status. Call placed to # [phone] and a voice mail message was left requesting a call back to # [phone] or [phone].

## 2015-04-24 NOTE — Telephone Encounter (Signed)
Transitional Care Clinic Post-discharge Follow-Up Phone Call:  Date of Discharge: 04/23/2015 Principal Discharge Diagnosis(es): ventricular tachycardia, atrial fibrillation, DM Post-discharge Communication: The patient was returning a call from this CM.  Call Completed: Yes                   With Whom: Patient Interpreter Needed:No                    Please check all that apply:   X Patient is knowledgeable of his/her condition(s) and/or treatment.   X Patient is caring for self at home.  X Patient is receiving assist at home from family and/or caregiver - her daughter and grandson ? Patient is receiving home health services. If so, name of agency.     Medication Reconciliation:  X Medication list reviewed with patient. X Patient obtained all discharge medications. If not, why?  She said that she got all of her medications at the pharmacy except the [facility] because she already has some from prior to her hospitalization. She also said that she only has 1 keflex tab left and did not receive another rx for the keflex.  She said that she was completing the course of keflex for treatment for the UTI that she had during her prior hospitalization.  There is no order for keflex at the [facility] pharmacy. She said that she will also discuss this with the doctor at her appointment on 04/27/15.  She said that she grandson picked up her prescriptions for her yesterday and she is very pleased how cost effective it is for her to get her medications at the [facility] pharmacy.  She said that she is eligible to change her medicare policy after 38/45/36 and she will consider enrolling in a plan with  Part D coverage.   Activities of Daily Living:  ? Independent X Needs assist (describe; ? home DME used) - She said that she is using her RW and is very glad to have it.  ? Total [facility] (describe, ? home DME used)   Community resources in place for patient:  X None  ? Home Health/Home DME ? Assisted Living ?  Support Group          Patient Education:  Discussed the services provided at the [facility], including social work and financial counseling. She said that she has the application for financial assistance with obtaining the xarelto.  She said that she was not given a rx for a glucometer and has not been told that she needs to check her blood sugar.  Instructed her to discuss the need for bloood glucose monitoring with Dr. Jarold Song at her appointment on Friday, 04/27/15.         Questions/Concerns discussed: She stated that she has no other questions/concerns. She also confirmed her appt for 04/27/15 @ 1115.

## 2015-04-25 ENCOUNTER — Encounter: Payer: Self-pay | Admitting: Physician Assistant

## 2015-04-25 DIAGNOSIS — I5032 Chronic diastolic (congestive) heart failure: Secondary | ICD-10-CM | POA: Insufficient documentation

## 2015-04-25 DIAGNOSIS — R942 Abnormal results of pulmonary function studies: Secondary | ICD-10-CM | POA: Insufficient documentation

## 2015-04-25 NOTE — Progress Notes (Signed)
Cardiology Office Note Date:  04/26/2015  Patient ID:  [name], [name] [birth year], MRN [id number] PCP:  Lujean Amel, MD  Cardiologist:  Dr. Mare Ferrari / EP - Dr. Rayann Heman saw patient in consult, Dr. 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 She had admission in 03/2015 for urosepsis, near syncope and AF RVR. Per notes she had previously been on amiodarone and Xarelto in 2014 but had self-d/c'd meds. 2D echo revealed normal LVF with EF of 54-00% probable diastolic dysfunction, elevated LVEDP and left atrial pressures, mild concentric and severe focal basal septal hypertrophy, severe left atrial enlargement, mild to moderate RA enlargement, moderate mitral regurgitation, moderately reduced RV systolic function, and moderate to severe tricuspid regurgitation with mildly elevated pulmonary pressures (40 mmHg). Troponins peaked at 0.24 and initially outpatient stress test was recommended. She was also treated for CHF. Given LA enlargement, rate control strategy was chosen. She was readmitted 9/2-9/9 with symptomatic paroxysmal ventricular tachycardia. Despite magnesium and amiodarone she continued to have sustained runs and required initiation of lidocaine along with calcium gluconate and additional magnesium. She eventually was switched to oral amiodarone and underwent successful ICD implantation with recommendation for no driving x 6 months. Coreg was changed to metoprolol. She was diuresed for a/c CHF. She underwent coronary CT suspicious for CAD and subsequently underwent cardiac cath showing 3V CAD, LVEDP 24, LVEF 45-50%. Her PFTs showed  severely decreased FVC and FEV1, although her pleural effusions could be contributing to that issue and it was felt worth having pulmonary evaluate her at some point. Cardiac surgery evaluated the patient and recommended medical therapy initially and then revaluate for CABG as outpatient in 2-3 months. She was not on any DM meds prior to admission and was recommended for close f/u with PCP for this. Review of labs reveals normal AST/ALT recently and abnormal TSH 6.084.  She comes in to clinic today feeling much better. SOB has resolved. No chest pain, palpitations, syncope or LEE. She denies ICD discharge. She is slowly building up stamina. She does notice occasional nausea since discharge.   Past Medical History  Diagnosis Date  . Diabetes mellitus without complication     dx on wed  . Paroxysmal atrial fibrillation   . Hypertrophic cardiomyopathy   . Left atrial enlargement   . Tricuspid regurgitation     a. Echo 04/2015: Mod-severe TR.  [name] Ventricular tachycardia     a. s/p St. Jude ICD implanted 04/2015.  [name] CAD (coronary artery disease), native coronary artery - 3 vessel 04/21/2015    a. NSTEMI 8-04/2015 felt 2/2 demand ischemia. b. 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  . Abnormal PFT   . Chronic diastolic CHF (congestive heart failure)   . Mitral regurgitation     a. Echo 04/2015: moderate mitral regurgitation.  . Diabetes mellitus     Past Surgical History  Procedure Laterality Date  . Dental surgery  Oct. 2011    several extractions, bone graft  . I&d extremity Left 01/06/2013    Procedure: IRRIGATION AND DEBRIDEMENT LEFT ELBOW AND LEFT FOREARM ;  Surgeon: [name], MD;  Location: [facility];  Service:  Orthopedics;  Laterality: Left;  . Application of wound vac Left 01/06/2013    Procedure: APPLICATION OF WOUND VAC X 2;  Surgeon: [name], MD;  Location: [facility];  Service: Orthopedics;  Laterality: Left;  left forearm    . Incision and drainage Left 01/09/2013    Procedure: REDO INCISION AND DRAINAGE LEFT ELBOW;  Surgeon: [name], MD;  Location: [facility];  Service: Orthopedics;  Laterality: Left;  SUPINE, UPPER EXTERMITY DRAPE  . Secondary closure of wound Left 01/09/2013    Procedure: SECONDARY CLOSURE OF WOUND  LEFT ELBOW;  Surgeon: [name], MD;  Location: [facility];  Service: Orthopedics;  Laterality: Left;  . Application of wound vac Left 01/09/2013    Procedure: APPLICATION OF WOUND VAC;  Surgeon: [name], MD;  Location: [facility];  Service: Orthopedics;  Laterality: Left;  . Ep implantable device N/A 04/16/2015    Procedure: ICD Implant;  Surgeon: [name], MD;  Location: [facility];  Service: Cardiovascular;  Laterality: N/A;  . Cardiac catheterization N/A 04/19/2015    Procedure: Left Heart Cath and Coronary Angiography;  Surgeon: [name], MD;  Location: [facility];  Service: Cardiovascular;  Laterality: N/A;    Current Outpatient Prescriptions  Medication Sig Dispense Refill  . amiodarone (PACERONE) 200 MG tablet Take 1 tablet (200 mg total) by mouth 2 (two) times daily. 60 tablet 2  . furosemide (LASIX) 40 MG tablet Take 1 tablet (40 mg total) by mouth daily. 30 tablet 1  . metFORMIN (GLUCOPHAGE) 500 MG tablet Take 1 tablet (500 mg total) by mouth 2 (two) times daily with a meal. 60 tablet 3  . metoprolol succinate (TOPROL-XL) 50 MG 24 hr tablet Take 1 tablet (50 mg total) by mouth 2 (two) times daily. 60 tablet 11  . potassium chloride (K-DUR,KLOR-CON) 10 MEQ tablet Take 2 tablets (20 mEq total) by mouth daily. 30 tablet 6  . rivaroxaban (XARELTO) 20 MG TABS tablet Take 1 tablet (20 mg total) by mouth daily. 30 tablet 1   No current facility-administered medications for this visit.    Allergies:   Review of patient's allergies indicates no known allergies.   Social History:  The patient  reports that she quit smoking about 34 years ago. She has never used  smokeless tobacco. She reports that she does not drink alcohol or use illicit drugs.   Family History:  The patient's family history includes Cancer in her mother; Diabetes in her father.  ROS:  Please see the history of present illness.  All other systems are reviewed and otherwise negative.   PHYSICAL EXAM:  VS:  BP 140/72 mmHg  Pulse 60  Ht 5\' 4"  (1.626 m)  Wt 160 lb 12.8 oz (72.938 kg)  BMI 27.59 kg/m2 BMI: Body mass index is 27.59 kg/(m^2). Well nourished, well developed WF, in no acute distress HEENT: normocephalic, atraumatic Neck: no JVD, carotid bruits or masses Cardiac:  normal S1, S2; RRR; no murmurs, rubs, or gallops Lungs:  clear to auscultation bilaterally, no wheezing, rhonchi or rales Abd: soft, nontender, no hepatomegaly, + BS MS: no deformity or atrophy Ext: no edema, right radial cath site without hematoma or ecchymosis Skin: warm and dry, no rash, left upper chest wall without hematoma or significant ecchymosis, no wound dehiscence Neuro:  moves all extremities spontaneously, no focal abnormalities noted, follows commands Psych: euthymic mood, full affect   EKG:  Done today shows atrial paced rhythm 60bpm, nonspecific  IVCD, nonspecific ST-T changes  Recent Labs: 04/01/2015: TSH 6.084* 04/13/2015: ALT 24; B Natriuretic Peptide 740.3* 04/15/2015: Hemoglobin 11.4*; Magnesium 2.4; Platelets 333 04/23/2015: BUN 25*; Creatinine, Ser 0.99; Potassium 4.0; Sodium 133*  No results found for requested labs within last 365 days.   Estimated Creatinine Clearance: 54 mL/min (by C-G formula based on Cr of 0.99).   Wt Readings from Last 3 Encounters:  04/26/15 160 lb 12.8 oz (72.938 kg)  04/23/15 163 lb 14.4 oz (74.345 kg)  04/05/15 180 lb 11.2 oz (81.965 kg)     Other studies reviewed: Additional studies/records reviewed today include: summarized above  ASSESSMENT AND PLAN:  1. Paroxysmal VT - s/p ICD. Interrogation today per EP. Further management per EP - will  schedule that f/u today. Continue amiodarone for now. Given her nausea I will plan to discuss dose with Dr. Caryl Comes. Will also check labs today. 2. Paroxysmal atrial fibrillation - atrial paced today, appears to be in NSR. Interrogation did show occasional episode of atrial fib (last on 9/17 - for 1 day 18 hours) - 21% afib since 04/17/15. Symptomatically improved. Continue current regimen. 3. Chronic diastolic CHF - appears euvolemic. BP running slightly higher (146/86 with recheck 140/72). Her BP was actually quite low in the hospital. Since she is having issues with nausea I do not want to complicate the picture too much - I have asked her to follow her BP at home 1-2x/day over the next week and call us with result. Based on her results would consider initiation of ACEI. If BP remain stable at home will also plan to rx SL NTG PRN as this is not currently on her med list. 4. CAD in native artery - not on ASA due to being on Xarelto. Will have her see MD in 6-8 weeks to revisit issue of possibly needing CABG. No angina. Continue BB. Add statin and check lipids/LFTs at time of f/u appointment. 5. Abnormal PFTs - I discussed further evaluation with patient. She is feeling much better. She wonders if they were so abnormal due to poor effort since they were done when she was acutely SOB from CHF. This may be the case. We discussed repeating the PFTs versus pulm referral and she actually elected to defer either of these for now. If she does need CABG they will likely need to be repeated when agreeable. 6. Abnormal TSH - recheck today. Will need to be periodically assessed on amiodarone.  Disposition: F/u with Dr. Mare Ferrari in 6-8 weeks and EP MD in 3 months (patient is discussing which EP MD with the EP nurse). She is also to f/u PCP for DM.  Current medicines are reviewed at length with the patient today.  The patient did not have any concerns regarding medicines.  Raechel Ache PA-C 04/26/2015 10:54 AM      [facility] [address] [place] [address] [phone] (office)  [phone] (fax)

## 2015-04-26 ENCOUNTER — Ambulatory Visit (INDEPENDENT_AMBULATORY_CARE_PROVIDER_SITE_OTHER): Payer: Medicare Other | Admitting: *Deleted

## 2015-04-26 ENCOUNTER — Encounter: Payer: Self-pay | Admitting: Physician Assistant

## 2015-04-26 ENCOUNTER — Ambulatory Visit (INDEPENDENT_AMBULATORY_CARE_PROVIDER_SITE_OTHER): Payer: Medicare Other | Admitting: Physician Assistant

## 2015-04-26 ENCOUNTER — Telehealth: Payer: Self-pay

## 2015-04-26 VITALS — BP 140/72 | HR 60 | Ht 64.0 in | Wt 160.8 lb

## 2015-04-26 DIAGNOSIS — I2510 Atherosclerotic heart disease of native coronary artery without angina pectoris: Secondary | ICD-10-CM

## 2015-04-26 DIAGNOSIS — I480 Paroxysmal atrial fibrillation: Secondary | ICD-10-CM

## 2015-04-26 DIAGNOSIS — I5032 Chronic diastolic (congestive) heart failure: Secondary | ICD-10-CM

## 2015-04-26 DIAGNOSIS — R7989 Other specified abnormal findings of blood chemistry: Secondary | ICD-10-CM | POA: Diagnosis not present

## 2015-04-26 DIAGNOSIS — I4729 Other ventricular tachycardia: Secondary | ICD-10-CM

## 2015-04-26 DIAGNOSIS — I422 Other hypertrophic cardiomyopathy: Secondary | ICD-10-CM | POA: Diagnosis not present

## 2015-04-26 DIAGNOSIS — I472 Ventricular tachycardia: Secondary | ICD-10-CM | POA: Diagnosis not present

## 2015-04-26 DIAGNOSIS — I951 Orthostatic hypotension: Secondary | ICD-10-CM

## 2015-04-26 DIAGNOSIS — R942 Abnormal results of pulmonary function studies: Secondary | ICD-10-CM

## 2015-04-26 DIAGNOSIS — I25810 Atherosclerosis of coronary artery bypass graft(s) without angina pectoris: Secondary | ICD-10-CM

## 2015-04-26 LAB — CBC WITH DIFFERENTIAL/PLATELET
BASOS ABS: 0.1 10*3/uL (ref 0.0–0.1)
Basophils Relative: 0.5 % (ref 0.0–3.0)
EOS ABS: 0.4 10*3/uL (ref 0.0–0.7)
EOS PCT: 4.7 % (ref 0.0–5.0)
HCT: 38.9 % (ref 36.0–46.0)
HEMOGLOBIN: 12.5 g/dL (ref 12.0–15.0)
Lymphocytes Relative: 13.3 % (ref 12.0–46.0)
Lymphs Abs: 1.2 10*3/uL (ref 0.7–4.0)
MCHC: 32.1 g/dL (ref 30.0–36.0)
MCV: 83.8 fl (ref 78.0–100.0)
MONO ABS: 0.5 10*3/uL (ref 0.1–1.0)
Monocytes Relative: 5.4 % (ref 3.0–12.0)
Neutro Abs: 7.1 10*3/uL (ref 1.4–7.7)
Neutrophils Relative %: 76.1 % (ref 43.0–77.0)
Platelets: 430 10*3/uL — ABNORMAL HIGH (ref 150.0–400.0)
RBC: 4.64 Mil/uL (ref 3.87–5.11)
RDW: 16 % — ABNORMAL HIGH (ref 11.5–15.5)
WBC: 9.4 10*3/uL (ref 4.0–10.5)

## 2015-04-26 LAB — CUP PACEART INCLINIC DEVICE CHECK
Brady Statistic RV Percent Paced: 6.9 %
Date Time Interrogation Session: 20160922121217
HighPow Impedance: 54 Ohm
Lead Channel Pacing Threshold Amplitude: 0.75 V
Lead Channel Pacing Threshold Amplitude: 1 V
Lead Channel Pacing Threshold Pulse Width: 0.5 ms
Lead Channel Pacing Threshold Pulse Width: 0.5 ms
Lead Channel Pacing Threshold Pulse Width: 0.5 ms
Lead Channel Setting Pacing Amplitude: 3.5 V
Lead Channel Setting Pacing Pulse Width: 0.5 ms
Lead Channel Setting Sensing Sensitivity: 0.5 mV
MDC IDC MSMT BATTERY REMAINING LONGEVITY: 78 mo
MDC IDC MSMT LEADCHNL RA IMPEDANCE VALUE: 537.5 Ohm
MDC IDC MSMT LEADCHNL RA PACING THRESHOLD AMPLITUDE: 0.75 V
MDC IDC MSMT LEADCHNL RA SENSING INTR AMPL: 4.2 mV
MDC IDC MSMT LEADCHNL RV IMPEDANCE VALUE: 537.5 Ohm
MDC IDC MSMT LEADCHNL RV PACING THRESHOLD AMPLITUDE: 1 V
MDC IDC MSMT LEADCHNL RV PACING THRESHOLD PULSEWIDTH: 0.5 ms
MDC IDC MSMT LEADCHNL RV SENSING INTR AMPL: 12 mV
MDC IDC PG SERIAL: 7279969
MDC IDC SET LEADCHNL RA PACING AMPLITUDE: 3.5 V
MDC IDC SET ZONE DETECTION INTERVAL: 350 ms
MDC IDC STAT BRADY RA PERCENT PACED: 45 %
Zone Setting Detection Interval: 270 ms
Zone Setting Detection Interval: 400 ms

## 2015-04-26 LAB — COMPREHENSIVE METABOLIC PANEL
ALK PHOS: 90 U/L (ref 39–117)
ALT: 10 U/L (ref 0–35)
AST: 15 U/L (ref 0–37)
Albumin: 4 g/dL (ref 3.5–5.2)
BILIRUBIN TOTAL: 0.7 mg/dL (ref 0.2–1.2)
BUN: 29 mg/dL — AB (ref 6–23)
CO2: 26 meq/L (ref 19–32)
Calcium: 9.6 mg/dL (ref 8.4–10.5)
Chloride: 101 mEq/L (ref 96–112)
Creatinine, Ser: 0.94 mg/dL (ref 0.40–1.20)
GFR: 63.06 mL/min (ref >60.00)
GLUCOSE: 123 mg/dL — AB (ref 70–99)
Potassium: 4.5 mEq/L (ref 3.5–5.1)
SODIUM: 139 meq/L (ref 135–145)
TOTAL PROTEIN: 7.7 g/dL (ref 6.0–8.3)

## 2015-04-26 LAB — TSH: TSH: 10.1 u[IU]/mL — AB (ref 0.35–4.50)

## 2015-04-26 LAB — MAGNESIUM: Magnesium: 2.4 mg/dL (ref 1.5–2.5)

## 2015-04-26 MED ORDER — ATORVASTATIN CALCIUM 40 MG PO TABS: 40.0000 mg | ORAL_TABLET | Freq: Every day | ORAL | Status: DC

## 2015-04-26 NOTE — Patient Instructions (Addendum)
Medication Instructions:  Your physician has recommended you make the following change in your medication:  1.  START Atorvastatin 40 mg taking 1 tablet at night  Labwork: TODAY:  MAGNESIUM                 CBC W/DIFF                   TSH                CMET                 LIPIDS:  6-8 WEEKS AT TIME OF APPT WITH DR. Mare Ferrari:      Testing/Procedures: NONE ORDERED  Follow-Up: Your physician recommends that you schedule a follow-up appointment in:  6-8 WEEKS WITH DR Magnet THAT IS SCHEDULED WITH DR. Lovena Le FOR 1213-16 @ 10:00.     Any Other Special Instructions Will Be Listed Below (If Applicable). Please keep a record of your blood pressure at least 1 - 2 times a day for 1 week and call our office with the results.

## 2015-04-26 NOTE — Telephone Encounter (Signed)
This Case Manager placed call to patient to check on status and to remind her of her initial [facility] appointment on 04/27/15 at 1115 with Dr. Jarold Song. Patient denies shortness of breath or chest pain. She indicates she just had follow-up appointment with Cardiologist today.  Informed patient of her [facility] appointment on 04/27/15; patient aware of appointment. Patient indicates her grandson will be bringing her to her appointment.  Informed patient to bring all medications to her appointment. Patient verbalized understanding. No new issues identified.

## 2015-04-26 NOTE — Progress Notes (Signed)
Wound check appointment. Steri-strips removed. Wound without redness or edema. Incision edges approximated, wound well healed. Normal device function. Thresholds, sensing, and impedances consistent with implant measurements. Device programmed at 3.5V for extra safety margin until 3 month visit. Histogram distribution appropriate for patient and level of activity. 7 mode switches- longest >24 hrs (21% AT/AF + xarelto). No ventricular arrhythmias noted. Patient educated about wound care, arm mobility, lifting restrictions, shock plan. ROV 07/17/15 at 10am with GT.

## 2015-04-27 ENCOUNTER — Encounter: Payer: Self-pay | Admitting: Family Medicine

## 2015-04-27 ENCOUNTER — Ambulatory Visit: Payer: Medicare Other | Attending: Family Medicine | Admitting: Family Medicine

## 2015-04-27 VITALS — BP 153/87 | HR 60 | Temp 97.5°F | Ht 64.0 in | Wt 157.4 lb

## 2015-04-27 DIAGNOSIS — E039 Hypothyroidism, unspecified: Secondary | ICD-10-CM | POA: Insufficient documentation

## 2015-04-27 DIAGNOSIS — E119 Type 2 diabetes mellitus without complications: Secondary | ICD-10-CM | POA: Diagnosis not present

## 2015-04-27 DIAGNOSIS — R942 Abnormal results of pulmonary function studies: Secondary | ICD-10-CM | POA: Diagnosis not present

## 2015-04-27 DIAGNOSIS — I2510 Atherosclerotic heart disease of native coronary artery without angina pectoris: Secondary | ICD-10-CM | POA: Diagnosis not present

## 2015-04-27 DIAGNOSIS — I480 Paroxysmal atrial fibrillation: Secondary | ICD-10-CM | POA: Insufficient documentation

## 2015-04-27 DIAGNOSIS — Z87891 Personal history of nicotine dependence: Secondary | ICD-10-CM | POA: Insufficient documentation

## 2015-04-27 DIAGNOSIS — E032 Hypothyroidism due to medicaments and other exogenous substances: Secondary | ICD-10-CM | POA: Insufficient documentation

## 2015-04-27 LAB — GLUCOSE, POCT (MANUAL RESULT ENTRY): POC Glucose: 142 mg/dl — AB (ref 70–99)

## 2015-04-27 MED ORDER — GLUCOSE BLOOD VI STRP: ORAL_STRIP | Status: AC

## 2015-04-27 MED ORDER — TRUEPLUS LANCETS 28G MISC: Status: AC

## 2015-04-27 MED ORDER — RIVAROXABAN 20 MG PO TABS: 20.0000 mg | ORAL_TABLET | Freq: Every day | ORAL | Status: DC

## 2015-04-27 MED ORDER — ATORVASTATIN CALCIUM 40 MG PO TABS: 40.0000 mg | ORAL_TABLET | Freq: Every day | ORAL | Status: DC

## 2015-04-27 MED ORDER — LEVOTHYROXINE SODIUM 50 MCG PO TABS: 50.0000 ug | ORAL_TABLET | Freq: Every day | ORAL | Status: DC

## 2015-04-27 MED ORDER — TRUE METRIX METER DEVI: 1.0000 | Freq: Two times a day (BID) | Status: AC

## 2015-04-27 NOTE — Progress Notes (Signed)
Patient here after recent hospital stay  Patient states it was for "urosepsis and CHF" She is taking all meds but the atorvastatin-she needs to pick it up Bloop pressure today is 153/87 and pulse is 60 No smoking, ETOH, or drugs No anxiety or depression reported CBG 142

## 2015-04-27 NOTE — Patient Instructions (Signed)
Hypothyroidism The thyroid is a large gland located in the lower front of your neck. The thyroid gland helps control metabolism. Metabolism is how your body handles food. It controls metabolism with the hormone thyroxine. When this gland is underactive (hypothyroid), it produces too little hormone.  CAUSES These include:   Absence or destruction of thyroid tissue.  Goiter due to iodine deficiency.  Goiter due to medications.  Congenital defects (since birth).  Problems with the pituitary. This causes a lack of TSH (thyroid stimulating hormone). This hormone tells the thyroid to turn out more hormone. SYMPTOMS  Lethargy (feeling as though you have no energy)  Cold intolerance  Weight gain (in spite of normal food intake)  Dry skin  Coarse hair  Menstrual irregularity (if severe, may lead to infertility)  Slowing of thought processes Cardiac problems are also caused by insufficient amounts of thyroid hormone. Hypothyroidism in the newborn is cretinism, and is an extreme form. It is important that this form be treated adequately and immediately or it will lead rapidly to retarded physical and mental development. DIAGNOSIS  To prove hypothyroidism, your caregiver may do blood tests and ultrasound tests. Sometimes the signs are hidden. It may be necessary for your caregiver to watch this illness with blood tests either before or after diagnosis and treatment. TREATMENT  Low levels of thyroid hormone are increased by using synthetic thyroid hormone. This is a safe, effective treatment. It usually takes about four weeks to gain the full effects of the medication. After you have the full effect of the medication, it will generally take another four weeks for problems to leave. Your caregiver may start you on low doses. If you have had heart problems the dose may be gradually increased. It is generally not an emergency to get rapidly to normal. HOME CARE INSTRUCTIONS   Take your  medications as your caregiver suggests. Let your caregiver know of any medications you are taking or start taking. Your caregiver will help you with dosage schedules.  As your condition improves, your dosage needs may increase. It will be necessary to have continuing blood tests as suggested by your caregiver.  Report all suspected medication side effects to your caregiver. SEEK MEDICAL CARE IF: Seek medical care if you develop:  Sweating.  Tremulousness (tremors).  Anxiety.  Rapid weight loss.  Heat intolerance.  Emotional swings.  Diarrhea.  Weakness. SEEK IMMEDIATE MEDICAL CARE IF:  You develop chest pain, an irregular heart beat (palpitations), or a rapid heart beat. MAKE SURE YOU:   Understand these instructions.  Will watch your condition.  Will get help right away if you are not doing well or get worse. Document Released: 07/21/2005 Document Revised: 10/13/2011 Document Reviewed: 03/10/2008 ExitCare Patient Information 2015 [employer]. This information is not intended to replace advice given to you by your health care Bradon Fester. Make sure you discuss any questions you have with your health care Gamble Enderle.  

## 2015-04-29 NOTE — Progress Notes (Signed)
[facility]  Date of Telephone Encounter: 04/26/15  Admit Date: 04/13/15 Discharge Date : 04/23/15  PCP: none  Subjective:  Patient ID: [name], female    DOB: 1946-05-25  Age: 69 y.o. MRN: [id number]  CC: Hospitalization Follow-up   HPI BRINDY HIGGINBOTHAM is a 69 year old female with a history of Type 2 DM,paroxysmal  Atrial fibrillation, hypertrophic cardiomyopathy, diastolic CHF, ventricular tachycardia s/p ICD, recently diagnosed three vessel CAD who comes into the [facility] after hospitalization from 04/13/15 - 04/23/15.  She presented to the ED at [facility] with weakness, palpitation, found to have VTach in the 150s. She received IV Amiodarone bolus and infusion which she did not respond to and became hypotensive with persisting tachycardia. IV Lidocaine, Magnesium and calcium gluconate given and she went into Afib. Seen by EP and she converted spontaneously to sinus rhythm. Cardiac cath revealed 3 vessel CAD, LVEDP of 24, ICD was implanted. She has a Pulmonary function test which revealed Severe Obstructive Airways Disease Insignificant response to bronchodilator Severe Restriction -Parenchymal Minimal Diffusion Defect.   Prior to this hospitalization she was hospitalized for urosepsis and near syncope, was found to be in Afib and RVR and was placed on Xarelto (which as per records she had self discontinued in 2014) and Toprol XL. She represented again on 04/13/15 with the above symptoms. Her condition improved, she was placed on Metformin for DM and subsequently discharged with a plan for CABG in 2-3 months.  Today she reports feeling overwhelmed due to having to take so many medications.  She denies chest pains or shortness of breath.  Outpatient Prescriptions Prior to Visit  Medication Sig Dispense Refill  . amiodarone (PACERONE) 200 MG tablet Take 1 tablet (200 mg total) by mouth 2 (two) times daily. 60 tablet 2  . furosemide (LASIX) 40 MG tablet  Take 1 tablet (40 mg total) by mouth daily. 30 tablet 1  . metFORMIN (GLUCOPHAGE) 500 MG tablet Take 1 tablet (500 mg total) by mouth 2 (two) times daily with a meal. 60 tablet 3  . metoprolol succinate (TOPROL-XL) 50 MG 24 hr tablet Take 1 tablet (50 mg total) by mouth 2 (two) times daily. 60 tablet 11  . potassium chloride (K-DUR,KLOR-CON) 10 MEQ tablet Take 2 tablets (20 mEq total) by mouth daily. 30 tablet 6  . rivaroxaban (XARELTO) 20 MG TABS tablet Take 1 tablet (20 mg total) by mouth daily. 30 tablet 1  . atorvastatin (LIPITOR) 40 MG tablet Take 1 tablet (40 mg total) by mouth daily at 6 PM. (Patient not taking: Reported on 04/27/2015) 90 tablet 3   No facility-administered medications prior to visit.    ROS Review of Systems  Constitutional: Negative for activity change, appetite change and fatigue.  HENT: Negative for congestion, sinus pressure and sore throat.   Eyes: Negative for visual disturbance.  Respiratory: Negative for cough, chest tightness, shortness of breath and wheezing.   Cardiovascular: Negative for chest pain and palpitations.  Gastrointestinal: Negative for abdominal pain, constipation and abdominal distention.  Endocrine: Negative for polydipsia.  Genitourinary: Negative for dysuria and frequency.  Musculoskeletal: Negative for back pain and arthralgias.  Skin: Negative for rash.  Neurological: Negative for tremors, light-headedness and numbness.  Hematological: Does not bruise/bleed easily.  Psychiatric/Behavioral: Negative for behavioral problems and agitation.    Objective:  BP 153/87 mmHg  Pulse 60  Temp(Src) 97.5 F (36.4 C)  Ht 5\' 4"  (1.626 m)  Wt 157 lb 6.4 oz (71.396  kg)  BMI 27.00 kg/m2  SpO2 96%  BP/Weight 04/27/2015 04/26/2015 9/62/8366  Systolic BP 294 765 465  Diastolic BP 87 72 89  Wt. (Lbs) 157.4 160.8 163.9  BMI 27 27.59 28.12  Some encounter information is confidential and restricted. Go to Review Flowsheets activity to see all data.       Physical Exam  Constitutional: She is oriented to person, place, and time. She appears well-developed and well-nourished. No distress.  HENT:  Head: Normocephalic.  Right Ear: External ear normal.  Left Ear: External ear normal.  Nose: Nose normal.  Mouth/Throat: Oropharynx is clear and moist.  Eyes: Conjunctivae and EOM are normal. Pupils are equal, round, and reactive to light.  Neck: Normal range of motion. No JVD present.  Cardiovascular: Normal rate, regular rhythm, normal heart sounds and intact distal pulses.  Exam reveals no gallop.   No murmur heard. Pulmonary/Chest: Effort normal and breath sounds normal. No respiratory distress. She has no wheezes. She has no rales. She exhibits no tenderness.  ICD in left upper chest wall  Abdominal: Soft. Bowel sounds are normal. She exhibits no distension and no mass. There is no tenderness.  Musculoskeletal: Normal range of motion. She exhibits no edema or tenderness.  Neurological: She is alert and oriented to person, place, and time. She has normal reflexes.  Skin: Skin is warm and dry. She is not diaphoretic.  Psychiatric: She has a normal mood and affect.     Assessment & Plan:   1. Type 2 diabetes mellitus without complication K3T 7.5 Continue Metformin - Glucose (CBG) - Blood Glucose Monitoring Suppl (TRUE METRIX METER) DEVI; 1 each by Does not apply route 2 (two) times daily.  Dispense: 1 Device; Refill: 0 - glucose blood (TRUE METRIX BLOOD GLUCOSE TEST) test strip; Use as instructed twice daily  Dispense: 100 each; Refill: 12 - TRUEPLUS LANCETS 28G MISC; Use as instructed twice daily.  Dispense: 60 each; Refill: 5  2. Paroxysmal atrial fibrillation Rate control with Metoprolol; rhythm control with Amiodarone, anticoagulation with Xarelto  3. Coronary artery disease involving native coronary artery of native heart without angina pectoris 3 vessel CAD Managed by [facility] CABG planned against the near  future Continue aggressive lifestyle modifications  4. Hypothyroidism due to medication Last TSH was elevated at 10.10 Patient was initially resistant to commencing Levothyroxine due to the fact that hypothyroidism was likely Amiodarone induced. Will commence Synthroid and she will need a repeat TSH at her next visit - levothyroxine (SYNTHROID, LEVOTHROID) 50 MCG tablet; Take 1 tablet (50 mcg total) by mouth daily.  Dispense: 30 tablet; Refill: 3  5. Abnormal PFT She will need a repeat PFT to monitor pulmonary function given the use of Amiodarone and especially in the setting a a current abnormal one revealing severe obstructive airway disease and severe parenchymal restriction.  Meds ordered this encounter  Medications  . levothyroxine (SYNTHROID, LEVOTHROID) 50 MCG tablet    Sig: Take 1 tablet (50 mcg total) by mouth daily.    Dispense:  30 tablet    Refill:  3  . Blood Glucose Monitoring Suppl (TRUE METRIX METER) DEVI    Sig: 1 each by Does not apply route 2 (two) times daily.    Dispense:  1 Device    Refill:  0  . glucose blood (TRUE METRIX BLOOD GLUCOSE TEST) test strip    Sig: Use as instructed twice daily    Dispense:  100 each    Refill:  12  .  TRUEPLUS LANCETS 28G MISC    Sig: Use as instructed twice daily.    Dispense:  60 each    Refill:  5  . atorvastatin (LIPITOR) 40 MG tablet    Sig: Take 1 tablet (40 mg total) by mouth daily at 6 PM.    Dispense:  30 tablet    Refill:  3  . rivaroxaban (XARELTO) 20 MG TABS tablet    Sig: Take 1 tablet (20 mg total) by mouth daily.    Dispense:  30 tablet    Refill:  11    Follow-up: Return in about 2 weeks (around 05/11/2015) for TCC follow-up with Dr. Jarold Song; schedule with Erline Levine for Diabetic education.   Arnoldo Morale MD

## 2015-05-01 ENCOUNTER — Telehealth: Payer: Self-pay

## 2015-05-01 NOTE — Telephone Encounter (Signed)
Attempted to contact the patient to check on her status. Call placed to # [phone] (H) and a voice mail message was left requesting a call back to [phone] or [phone].

## 2015-05-03 ENCOUNTER — Telehealth: Payer: Self-pay

## 2015-05-03 NOTE — Telephone Encounter (Signed)
This Case Manager placed call to patient to check on status. Call placed to [phone]; unable to reach patient. Left voicemail requesting return call.

## 2015-05-07 ENCOUNTER — Telehealth: Payer: Self-pay

## 2015-05-07 NOTE — Telephone Encounter (Signed)
Attempted to contact the patient to check on her status.  Call placed to # [phone] (H) and a voice mail message was left requesting a call back to # [phone] or [phone].

## 2015-05-08 ENCOUNTER — Telehealth: Payer: Self-pay

## 2015-05-08 NOTE — Telephone Encounter (Signed)
Attempted to contact the patient to check on her status. She returned the CM call from yesterday and this CM was calling her back.  Call placed to # [phone] (H) and a voice mail message was left requesting a call back to # [phone] or [phone].

## 2015-05-09 ENCOUNTER — Telehealth: Payer: Self-pay | Admitting: Family Medicine

## 2015-05-09 ENCOUNTER — Telehealth: Payer: Self-pay | Admitting: Cardiology

## 2015-05-09 NOTE — Telephone Encounter (Signed)
Patients daughter called stating that the patient has been having Syncope episodes with vomiting. They would like some advice on what to do next.

## 2015-05-09 NOTE — Telephone Encounter (Signed)
New message  Pt c/o Syncope: STAT if syncope occurred within 30 minutes and pt complains of lightheadedness High Priority if episode of passing out, completely, today or in last 24 hours   1. Did you pass out today? Yes   2. When is the last time you passed out? This morning around 10am   3. Has this occurred multiple times? 3 times since Monday.   4. Did you have any symptoms prior to passing out? Yes lightheaded dizzy and sick on the stomach pass two weeks serverly nauseated vomiting and really hard to eat anything.

## 2015-05-09 NOTE — Telephone Encounter (Signed)
Patient called c/o "not being well" since discharge from the hospital. She st she is constantly lightheaded, nauseated, and vomits about 4-5 times daily. She also st she has passed out three times since Monday, last being this morning at 1030. She has no VS to report.  She has no chest pain, no SOB.  Per Dr. Mare Ferrari, instructed patient to go to the ED for evaluation. Patient agrees with treatment plan.

## 2015-05-11 ENCOUNTER — Encounter (HOSPITAL_COMMUNITY): Payer: Self-pay | Admitting: Nurse Practitioner

## 2015-05-11 ENCOUNTER — Inpatient Hospital Stay (HOSPITAL_COMMUNITY): Payer: Medicare Other

## 2015-05-11 ENCOUNTER — Inpatient Hospital Stay (HOSPITAL_COMMUNITY)
Admission: EM | Admit: 2015-05-11 | Discharge: 2015-05-14 | DRG: 312 | Disposition: A | Discharge status: Home / Self Care | Payer: Medicare Other | Attending: Internal Medicine | Admitting: Internal Medicine

## 2015-05-11 DIAGNOSIS — R1084 Generalized abdominal pain: Secondary | ICD-10-CM | POA: Diagnosis not present

## 2015-05-11 DIAGNOSIS — I951 Orthostatic hypotension: Secondary | ICD-10-CM | POA: Diagnosis not present

## 2015-05-11 DIAGNOSIS — E119 Type 2 diabetes mellitus without complications: Secondary | ICD-10-CM | POA: Diagnosis not present

## 2015-05-11 DIAGNOSIS — Z95810 Presence of automatic (implantable) cardiac defibrillator: Secondary | ICD-10-CM | POA: Diagnosis not present

## 2015-05-11 DIAGNOSIS — I2510 Atherosclerotic heart disease of native coronary artery without angina pectoris: Secondary | ICD-10-CM | POA: Diagnosis present

## 2015-05-11 DIAGNOSIS — I252 Old myocardial infarction: Secondary | ICD-10-CM | POA: Diagnosis not present

## 2015-05-11 DIAGNOSIS — R55 Syncope and collapse: Secondary | ICD-10-CM | POA: Diagnosis present

## 2015-05-11 DIAGNOSIS — Z87891 Personal history of nicotine dependence: Secondary | ICD-10-CM

## 2015-05-11 DIAGNOSIS — E860 Dehydration: Secondary | ICD-10-CM | POA: Diagnosis present

## 2015-05-11 DIAGNOSIS — E785 Hyperlipidemia, unspecified: Secondary | ICD-10-CM | POA: Diagnosis not present

## 2015-05-11 DIAGNOSIS — Z7901 Long term (current) use of anticoagulants: Secondary | ICD-10-CM | POA: Diagnosis not present

## 2015-05-11 DIAGNOSIS — R112 Nausea with vomiting, unspecified: Secondary | ICD-10-CM | POA: Diagnosis not present

## 2015-05-11 DIAGNOSIS — I5032 Chronic diastolic (congestive) heart failure: Secondary | ICD-10-CM | POA: Diagnosis present

## 2015-05-11 DIAGNOSIS — N179 Acute kidney failure, unspecified: Secondary | ICD-10-CM | POA: Diagnosis not present

## 2015-05-11 DIAGNOSIS — Z809 Family history of malignant neoplasm, unspecified: Secondary | ICD-10-CM | POA: Diagnosis not present

## 2015-05-11 DIAGNOSIS — E039 Hypothyroidism, unspecified: Secondary | ICD-10-CM | POA: Diagnosis present

## 2015-05-11 DIAGNOSIS — I4891 Unspecified atrial fibrillation: Secondary | ICD-10-CM | POA: Diagnosis not present

## 2015-05-11 DIAGNOSIS — R109 Unspecified abdominal pain: Secondary | ICD-10-CM | POA: Diagnosis present

## 2015-05-11 DIAGNOSIS — I25110 Atherosclerotic heart disease of native coronary artery with unstable angina pectoris: Secondary | ICD-10-CM | POA: Diagnosis not present

## 2015-05-11 DIAGNOSIS — I422 Other hypertrophic cardiomyopathy: Secondary | ICD-10-CM | POA: Diagnosis present

## 2015-05-11 DIAGNOSIS — Z833 Family history of diabetes mellitus: Secondary | ICD-10-CM

## 2015-05-11 DIAGNOSIS — I472 Ventricular tachycardia: Secondary | ICD-10-CM

## 2015-05-11 DIAGNOSIS — R079 Chest pain, unspecified: Secondary | ICD-10-CM | POA: Diagnosis not present

## 2015-05-11 DIAGNOSIS — I480 Paroxysmal atrial fibrillation: Secondary | ICD-10-CM | POA: Diagnosis present

## 2015-05-11 LAB — CBC
HCT: 46.1 % — ABNORMAL HIGH (ref 36.0–46.0)
Hemoglobin: 14.9 g/dL (ref 12.0–15.0)
MCH: 27.2 pg (ref 26.0–34.0)
MCHC: 32.3 g/dL (ref 30.0–36.0)
MCV: 84.3 fL (ref 78.0–100.0)
Platelets: 290 10*3/uL (ref 150–400)
RBC: 5.47 MIL/uL — ABNORMAL HIGH (ref 3.87–5.11)
RDW: 14.9 % (ref 11.5–15.5)
WBC: 10.9 10*3/uL — ABNORMAL HIGH (ref 4.0–10.5)

## 2015-05-11 LAB — BASIC METABOLIC PANEL
Anion gap: 9 (ref 5–15)
BUN: 41 mg/dL — ABNORMAL HIGH (ref 6–20)
CO2: 27 mmol/L (ref 22–32)
Calcium: 9.5 mg/dL (ref 8.9–10.3)
Chloride: 100 mmol/L — ABNORMAL LOW (ref 101–111)
Creatinine, Ser: 1.25 mg/dL — ABNORMAL HIGH (ref 0.44–1.00)
GFR calc Af Amer: 50 mL/min — ABNORMAL LOW (ref >60)
GFR calc non Af Amer: 43 mL/min — ABNORMAL LOW (ref >60)
Glucose, Bld: 254 mg/dL — ABNORMAL HIGH (ref 65–99)
Potassium: 5 mmol/L (ref 3.5–5.1)
Sodium: 136 mmol/L (ref 135–145)

## 2015-05-11 LAB — URINALYSIS, ROUTINE W REFLEX MICROSCOPIC
Bilirubin Urine: NEGATIVE
Glucose, UA: >1000 mg/dL — AB
Hgb urine dipstick: NEGATIVE
Ketones, ur: NEGATIVE mg/dL
Nitrite: NEGATIVE
Protein, ur: 30 mg/dL — AB
Specific Gravity, Urine: 1.022 (ref 1.005–1.030)
Urobilinogen, UA: 1 mg/dL (ref 0.0–1.0)
pH: 5 (ref 5.0–8.0)

## 2015-05-11 LAB — MAGNESIUM: Magnesium: 2.4 mg/dL (ref 1.7–2.4)

## 2015-05-11 LAB — I-STAT TROPONIN, ED: Troponin i, poc: 0.07 ng/mL (ref 0.00–0.08)

## 2015-05-11 LAB — URINE MICROSCOPIC-ADD ON

## 2015-05-11 LAB — CBG MONITORING, ED: Glucose-Capillary: 232 mg/dL — ABNORMAL HIGH (ref 65–99)

## 2015-05-11 MED ORDER — MORPHINE SULFATE (PF) 2 MG/ML IV SOLN
2.0000 mg | INTRAVENOUS | Status: DC | PRN
Start: 2015-05-11 — End: 2015-05-14

## 2015-05-11 MED ORDER — ACETAMINOPHEN 650 MG RE SUPP: 650.0000 mg | Freq: Four times a day (QID) | RECTAL | Status: DC | PRN

## 2015-05-11 MED ORDER — ONDANSETRON HCL 4 MG/2ML IJ SOLN
4.0000 mg | Freq: Once | INTRAMUSCULAR | Status: DC
  Administered 2015-05-11: 4 mg via INTRAVENOUS
  Filled 2015-05-11: qty 2

## 2015-05-11 MED ORDER — ACETAMINOPHEN 325 MG PO TABS
650.0000 mg | ORAL_TABLET | Freq: Four times a day (QID) | ORAL | Status: DC | PRN
  Administered 2015-05-11: 650 mg via ORAL
  Filled 2015-05-11: qty 2

## 2015-05-11 MED ORDER — LEVOTHYROXINE SODIUM 50 MCG PO TABS: 50.0000 ug | ORAL_TABLET | Freq: Every day | ORAL | Status: DC

## 2015-05-11 MED ORDER — RIVAROXABAN 20 MG PO TABS
20.0000 mg | ORAL_TABLET | Freq: Every day | ORAL | Status: DC
  Administered 2015-05-12 – 2015-05-13 (×2): 20 mg via ORAL
  Filled 2015-05-11 (×2): qty 1

## 2015-05-11 MED ORDER — SODIUM CHLORIDE 0.9 % IJ SOLN
3.0000 mL | Freq: Two times a day (BID) | INTRAMUSCULAR | Status: DC
  Administered 2015-05-12 – 2015-05-14 (×4): 3 mL via INTRAVENOUS

## 2015-05-11 MED ORDER — RIVAROXABAN 20 MG PO TABS: 20.0000 mg | ORAL_TABLET | Freq: Every day | ORAL | Status: DC

## 2015-05-11 MED ORDER — INSULIN ASPART 100 UNIT/ML ~~LOC~~ SOLN
0.0000 [IU] | Freq: Three times a day (TID) | SUBCUTANEOUS | Status: DC
  Administered 2015-05-12: 3 [IU] via SUBCUTANEOUS
  Administered 2015-05-12: 2 [IU] via SUBCUTANEOUS
  Administered 2015-05-13 (×2): 1 [IU] via SUBCUTANEOUS
  Administered 2015-05-13: 5 [IU] via SUBCUTANEOUS
  Administered 2015-05-14: 3 [IU] via SUBCUTANEOUS
  Administered 2015-05-14: 2 [IU] via SUBCUTANEOUS

## 2015-05-11 MED ORDER — ONDANSETRON HCL 4 MG/2ML IJ SOLN
4.0000 mg | Freq: Once | INTRAMUSCULAR | Status: AC
  Administered 2015-05-11: 4 mg via INTRAVENOUS
  Filled 2015-05-11: qty 2

## 2015-05-11 MED ORDER — SODIUM CHLORIDE 0.9 % IV BOLUS (SEPSIS)
500.0000 mL | Freq: Once | INTRAVENOUS | Status: AC
  Administered 2015-05-11: 500 mL via INTRAVENOUS

## 2015-05-11 MED ORDER — SODIUM CHLORIDE 0.9 % IV SOLN
INTRAVENOUS | Status: DC
  Administered 2015-05-12: 01:00:00 via INTRAVENOUS

## 2015-05-11 MED ORDER — ONDANSETRON HCL 4 MG/2ML IJ SOLN: 4.0000 mg | Freq: Three times a day (TID) | INTRAMUSCULAR | Status: DC | PRN

## 2015-05-11 MED ORDER — SODIUM CHLORIDE 0.9 % IV SOLN: INTRAVENOUS | Status: DC

## 2015-05-11 MED ORDER — ATORVASTATIN CALCIUM 40 MG PO TABS
40.0000 mg | ORAL_TABLET | Freq: Every day | ORAL | Status: DC
  Administered 2015-05-12 – 2015-05-13 (×2): 40 mg via ORAL
  Filled 2015-05-11 (×2): qty 1

## 2015-05-11 MED ORDER — MORPHINE SULFATE (PF) 4 MG/ML IV SOLN
4.0000 mg | Freq: Once | INTRAVENOUS | Status: DC
  Filled 2015-05-11: qty 1

## 2015-05-11 MED ORDER — AMIODARONE HCL 200 MG PO TABS
200.0000 mg | ORAL_TABLET | Freq: Two times a day (BID) | ORAL | Status: DC
  Filled 2015-05-11: qty 1

## 2015-05-11 MED ORDER — METOPROLOL SUCCINATE ER 50 MG PO TB24
50.0000 mg | ORAL_TABLET | Freq: Two times a day (BID) | ORAL | Status: DC
  Filled 2015-05-11: qty 1

## 2015-05-11 NOTE — ED Notes (Addendum)
Pt reports 1 week history of n/v/syncopal episodes. Symptoms worse when she moves from sitting to stqanding. She was discharged from [facility] on 9/19 after pacer/defib pacemaker. She c/o pain in L side of chest when lifting her arm only. She denies sob, fevers, bowel/bladder changes. She is A&Ox4, resp e/u

## 2015-05-11 NOTE — ED Provider Notes (Signed)
CSN: [id number]     Arrival date & time 05/11/15  1824 History   First MD Initiated Contact with Patient 05/11/15 1852     Chief Complaint  Patient presents with  . Emesis  . Loss of Consciousness     (Consider location/radiation/quality/duration/timing/severity/associated sxs/prior Treatment) HPI   69 y.o. 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 Presenting today with continued n/v and syncope and near syncopal episodes. Patient was admitted about a month ago with urosepsis. She's been having ongoing nausea and vomiting since then. Has not significantly worsened recently, but she reports that really hasn't improved much either. Denies any acute pain. No fevers or chills. Feels generally weak. Very fatigued with minimal activity. Presyncopal symptoms most often with changes in position. She is felt dizzy while at rest as well though. No palpitations. Has not felt like her AICD has fired. Reports compliance with her medications. Having some soreness at site of ICD with movement of LUE, but this has been improving.   Past Medical History  Diagnosis Date  . Diabetes mellitus without complication ([place])     dx on wed  . Paroxysmal atrial fibrillation ([name])   . Hypertrophic cardiomyopathy ([place])   . Left atrial enlargement   . Tricuspid regurgitation     a. Echo 04/2015: Mod-severe TR.  [name] Ventricular tachycardia ([place])     a. s/p St. Jude ICD implanted 04/2015.  [name] CAD (coronary artery disease), native coronary artery - 3 vessel 04/21/2015    a. NSTEMI 8-04/2015 felt 2/2 demand ischemia. b. 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  . Abnormal PFT   . Chronic diastolic CHF (congestive heart failure) ([place])   . Mitral regurgitation     a. Echo 04/2015: moderate  mitral regurgitation.  . Diabetes mellitus [facility])    Past Surgical History  Procedure Laterality Date  . Dental surgery  Oct. 2011    several extractions, bone graft  . I&d extremity Left 01/06/2013    Procedure: IRRIGATION AND DEBRIDEMENT LEFT ELBOW AND LEFT FOREARM ;  Surgeon: [name], MD;  Location: [facility];  Service: Orthopedics;  Laterality: Left;  . Application of wound vac Left 01/06/2013    Procedure: APPLICATION OF WOUND VAC X 2;  Surgeon: [name], MD;  Location: [facility];  Service: Orthopedics;  Laterality: Left;  left forearm  . Incision and drainage Left 01/09/2013    Procedure: REDO INCISION AND DRAINAGE LEFT ELBOW;  Surgeon: [name], MD;  Location: [facility];  Service: Orthopedics;  Laterality: Left;  SUPINE, UPPER EXTERMITY DRAPE  . Secondary closure of wound Left 01/09/2013    Procedure: SECONDARY CLOSURE OF WOUND  LEFT ELBOW;  Surgeon: [name], MD;  [facility]: [facility];  Service: Orthopedics;  Laterality: Left;  . Application of wound vac Left 01/09/2013    Procedure: APPLICATION OF WOUND VAC;  Surgeon: [name], MD;  [facility];  Service: Orthopedics;  Laterality: Left;  . Ep implantable device N/A 04/16/2015    Procedure: ICD Implant;  Surgeon: [name], MD;  Location: [place] [facility];  Service: Cardiovascular;  Laterality: N/A;  . Cardiac catheterization N/A 04/19/2015    Procedure: Left Heart Cath and Coronary Angiography;  Surgeon: [name], MD;  Location: [place] [facility];  Service: Cardiovascular;  Laterality: N/A;   Family History  Problem Relation Age of Onset  . Cancer Mother   . Diabetes Father    Social History  Substance Use Topics  . Smoking status: Former Smoker -- 4 years    Quit date: 12/31/1980  . Smokeless tobacco: Never Used  . Alcohol Use: No   OB History    Gravida Para Term Preterm AB TAB SAB Ectopic Multiple Living            1     Review of Systems  All systems reviewed and negative, other  than as noted in HPI.   Allergies  Review of patient's allergies indicates no known allergies.  Home Medications   Prior to Admission medications   Medication Sig Start Date End Date Taking? Authorizing Provider  amiodarone (PACERONE) 200 MG tablet Take 1 tablet (200 mg total) by mouth 2 (two) times daily. 04/23/15  Yes Bhavinkumar Bhagat, PA  atorvastatin (LIPITOR) 40 MG tablet Take 1 tablet (40 mg total) by mouth daily at 6 PM. 04/27/15  Yes Arnoldo Morale, MD  furosemide (LASIX) 40 MG tablet Take 1 tablet (40 mg total) by mouth daily. 04/05/15  Yes Reyne Dumas, MD  metFORMIN (GLUCOPHAGE) 500 MG tablet Take 1 tablet (500 mg total) by mouth 2 (two) times daily with a meal. 04/23/15  Yes Bhavinkumar Bhagat, PA  metoprolol succinate (TOPROL-XL) 50 MG 24 hr tablet Take 1 tablet (50 mg total) by mouth 2 (two) times daily. 04/23/15  Yes Bhavinkumar Bhagat, PA  potassium chloride (K-DUR,KLOR-CON) 10 MEQ tablet Take 2 tablets (20 mEq total) by mouth daily. 04/23/15  Yes Bhavinkumar Bhagat, PA  rivaroxaban (XARELTO) 20 MG TABS tablet Take 1 tablet (20 mg total) by mouth daily. 04/27/15  Yes Arnoldo Morale, MD  Blood Glucose Monitoring Suppl (TRUE METRIX METER) DEVI 1 each by Does not apply route 2 (two) times daily. 04/27/15   Arnoldo Morale, MD  glucose blood (TRUE METRIX BLOOD GLUCOSE TEST) test strip Use as instructed twice daily 04/27/15   Arnoldo Morale, MD  levothyroxine (SYNTHROID, LEVOTHROID) 50 MCG tablet Take 1 tablet (50 mcg total) by mouth daily. Patient not taking: Reported on 05/11/2015 04/27/15   Arnoldo Morale, MD  TRUEPLUS LANCETS 28G MISC Use as instructed twice daily. 04/27/15   Arnoldo Morale, MD   BP 117/61 mmHg  Pulse 60  Temp(Src) 97.4 F (36.3 C) (Oral)  Resp 18  SpO2 98% Physical Exam  Constitutional: She appears well-developed and well-nourished. No distress.  Laying in bed. Appears tired, but not distressed.  HENT:  Head: Normocephalic and atraumatic.  Eyes: Conjunctivae are normal.  Right eye exhibits no discharge. Left eye exhibits no discharge.  Neck: Neck supple.  Cardiovascular: Normal rate, regular rhythm and normal heart sounds.  Exam reveals no gallop and no friction rub.   No murmur heard. ICD insertion site appears to be healing without complication.  Pulmonary/Chest: Effort normal and breath sounds normal. No respiratory distress.  Abdominal: Soft. She exhibits no distension. There is no tenderness.  Musculoskeletal: She exhibits no edema or tenderness.  Neurological: She is alert.  Skin: Skin is warm and dry.  Psychiatric: She has a normal mood and affect. Her behavior is normal. Thought content normal.  Nursing note and vitals reviewed.   ED Course  Procedures (including critical care time) Labs Review Labs Reviewed  BASIC METABOLIC PANEL - Abnormal; Notable for the following:    Chloride 100 (*)    Glucose, Bld 254 (*)  BUN 41 (*)    Creatinine, Ser 1.25 (*)    GFR calc non Af Amer 43 (*)    GFR calc Af Amer 50 (*)    All other components within normal limits  CBC - Abnormal; Notable for the following:    WBC 10.9 (*)    RBC 5.47 (*)    HCT 46.1 (*)    All other components within normal limits  CBG MONITORING, ED - Abnormal; Notable for the following:    Glucose-Capillary 232 (*)    All other components within normal limits  MAGNESIUM  URINALYSIS, ROUTINE W REFLEX MICROSCOPIC (NOT AT [facility])  I-STAT TROPOININ, ED    Imaging Review No results found. I have personally reviewed and evaluated these images and lab results as part of my medical decision-making.   EKG Interpretation   Date/Time:  Friday May 11 2015 18:30:56 EDT Ventricular Rate:  60 PR Interval:  238 QRS Duration: 134 QT Interval:  448 QTC Calculation: 448 R Axis:   97 Text Interpretation:  Atrial-paced rhythm with prolonged AV conduction  Rightward axis Non-specific intra-ventricular conduction block Nonspecific  T wave abnormality Abnormal ECG Confirmed by Wilson Singer   MD, Hari Casaus ([id number]) on  05/11/2015 6:53:15 PM      MDM   Final diagnoses:  Syncope  Nausea & vomiting  AKI (acute kidney injury) ([place])  Syncope  Nausea & vomiting  AKI (acute kidney injury) ([place])  Syncope  Nausea & vomiting  AKI (acute kidney injury) ([place])    69 year old female with multiple complaints. Likely multifactorial. Patient has been in and out of the hospital several times over the past couple months. Likely some component of deconditioning. Less consistent with dehydration likely from anorexia and nausea/vomiting. Her sycnope/near syncope seems most consistent with orthostasis. Cardiology was consulted. Ultimately discussed with medicine for admission.   Virgel Manifold, MD 05/11/15 2256

## 2015-05-11 NOTE — H&P (Addendum)
[facility] History and Physical  [name] MPN:[id number] DOB: 06/20/1946 DOA: 05/11/2015  Referring physician: ED physician PCP: Lujean Amel, MD  Specialists:   Chief Complaint: Nausea, vomiting, abdominal pain, dizziness, syncope  HPI: [name] is a 69 y.o. 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 She has dizziness, generalized weakness. She passed out for more than 5 times. No head injury. She does not have diarrhea. Her abdominal pain is diffuse, moderate and constant. She reports that she has mild pain below the left shoulder blade only when she lifts her left her arms. Patient does not have chest pain, cough, shortness of breath, symptoms of UTI. She states that she did not take her Xarelto since yesterday due to nausea and vomiting. She also states that she has not been taking her Synthroid since 9/26 for no reason. Patient does not have headache, unilateral weakness, numbness, diminished sensations in her extremities, no vision change or hearing loss.  In ED, patient was found to have WBC 10.9, temperature normal, slightly bradycardia, AKI on, negative troponin. She was admitted inpatient for further management and treatment. Cardiology was consulted of ADD.  Where does patient live?   At home   Can patient participate in ADLs?   Some   Review of Systems:   General: no fevers, chills, no changes in body weight, has poor appetite, has fatigue HEENT: no blurry vision, hearing changes or sore throat Pulm: no dyspnea, coughing, wheezing CV: no chest pain, palpitations Abd: has nausea, vomiting, abdominal  pain, no diarrhea, constipation GU: no dysuria, burning on urination, increased urinary frequency, hematuria  Ext: no leg edema Neuro: no unilateral weakness, numbness, or tingling, no vision change or hearing loss. Has syncope and dizziness. Skin: no rash MSK: No muscle spasm, no deformity, no limitation of range of movement in spin Heme: No easy bruising.  Travel history: No recent long distant travel.  Allergy: No Known Allergies  Past Medical History  Diagnosis Date  . Diabetes mellitus without complication ([place])     dx on wed  . Paroxysmal atrial fibrillation ([name])   . Hypertrophic cardiomyopathy ([place])   . Left atrial enlargement   . Tricuspid regurgitation     a. Echo 04/2015: Mod-severe TR.  [name] Ventricular tachycardia ([place])     a. s/p St. Jude ICD implanted 04/2015.  [name] CAD (coronary artery disease), native coronary artery - 3 vessel 04/21/2015    a. NSTEMI 8-04/2015 felt 2/2 demand ischemia. b. 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  . Abnormal PFT   . Chronic diastolic CHF (congestive heart failure) ([place])   . Mitral regurgitation     a. Echo 04/2015: moderate mitral regurgitation.  . Diabetes mellitus [facility])     Past Surgical History  Procedure Laterality Date  . Dental surgery  Oct. 2011    several extractions, bone graft  . I&d extremity Left 01/06/2013    Procedure: IRRIGATION AND DEBRIDEMENT LEFT ELBOW AND LEFT FOREARM ;  Surgeon: [name], MD;  [facility];  Service: Orthopedics;  Laterality: Left;  . Application of wound vac Left 01/06/2013    Procedure: APPLICATION OF WOUND  VAC X 2;  Surgeon: [name], MD;  [facility];  Service: Orthopedics;  Laterality: Left;  left forearm  . Incision and drainage Left 01/09/2013    Procedure: REDO INCISION AND DRAINAGE LEFT ELBOW;  Surgeon: [name], MD;  [facility];  Service: Orthopedics;  Laterality: Left;  SUPINE, UPPER EXTERMITY DRAPE  .  Secondary closure of wound Left 01/09/2013    Procedure: SECONDARY CLOSURE OF WOUND  LEFT ELBOW;  Surgeon: [name], MD;  [facility];  Service: Orthopedics;  Laterality: Left;  . Application of wound vac Left 01/09/2013    Procedure: APPLICATION OF WOUND VAC;  Surgeon: [name], MD;  [facility];  Service: Orthopedics;  Laterality: Left;  . Ep implantable device N/A 04/16/2015    Procedure: ICD Implant;  Surgeon: [name], MD;  Location: [place] [facility];  Service: Cardiovascular;  Laterality: N/A;  . Cardiac catheterization N/A 04/19/2015    Procedure: Left Heart Cath and Coronary Angiography;  Surgeon: [name], MD;  Location: [place] [facility];  Service: Cardiovascular;  Laterality: N/A;    Social History:  reports that she quit smoking about 34 years ago. She has never used smokeless tobacco. She reports that she does not drink alcohol or use illicit drugs.  Family History:  Family History  Problem Relation Age of Onset  . Cancer Mother   . Diabetes Father      Prior to Admission medications   Medication Sig Start Date End Date Taking? Authorizing Provider  amiodarone (PACERONE) 200 MG tablet Take 1 tablet (200 mg total) by mouth 2 (two) times daily. 04/23/15  Yes Bhavinkumar Bhagat, PA  atorvastatin (LIPITOR) 40 MG tablet Take 1 tablet (40 mg total) by mouth daily at 6 PM. 04/27/15  Yes Arnoldo Morale, MD  furosemide (LASIX) 40 MG tablet Take 1 tablet (40 mg total) by mouth daily. 04/05/15  Yes Reyne Dumas, MD  metFORMIN (GLUCOPHAGE) 500 MG tablet Take 1 tablet (500 mg total) by mouth 2 (two) times daily with a meal. 04/23/15  Yes Bhavinkumar Bhagat, PA  metoprolol succinate (TOPROL-XL) 50 MG 24 hr tablet Take 1 tablet (50 mg total) by mouth 2 (two) times daily. 04/23/15  Yes Bhavinkumar Bhagat, PA  potassium chloride (K-DUR,KLOR-CON) 10 MEQ tablet Take 2 tablets (20 mEq total) by mouth daily. 04/23/15  Yes Bhavinkumar Bhagat, PA  rivaroxaban (XARELTO) 20 MG  TABS tablet Take 1 tablet (20 mg total) by mouth daily. 04/27/15  Yes Arnoldo Morale, MD  Blood Glucose Monitoring Suppl (TRUE METRIX METER) DEVI 1 each by Does not apply route 2 (two) times daily. 04/27/15   Arnoldo Morale, MD  glucose blood (TRUE METRIX BLOOD GLUCOSE TEST) test strip Use as instructed twice daily 04/27/15   Arnoldo Morale, MD  levothyroxine (SYNTHROID, LEVOTHROID) 50 MCG tablet Take 1 tablet (50 mcg total) by mouth daily. Patient not taking: Reported on 05/11/2015 04/27/15   Arnoldo Morale, MD  TRUEPLUS LANCETS 28G MISC Use as instructed twice daily. 04/27/15   Arnoldo Morale, MD    Physical Exam: Filed Vitals:   05/11/15 2030 05/11/15 2130 05/11/15 2200 05/11/15 2230  BP: 149/53 157/77 153/76 159/71  Pulse: 66 65 67 65  Temp:      TempSrc:      Resp: 24 15 14 16   SpO2: 98% 97% 98% 99%   General: Not in acute distress. Dry mucus and membrane HEENT:       Eyes: PERRL, EOMI,  no scleral icterus.       ENT: No discharge from the ears and nose, no pharynx injection, no tonsillar enlargement.        Neck: No JVD, no bruit, no mass felt. Heme: No neck lymph node enlargement. Cardiac: S1/S2, RRR, No murmurs, No gallops or rubs. Pulm: No rales, wheezing, rhonchi or rubs. Abd: Soft, nondistended, mild tenderness diffusely, no rebound pain, no organomegaly, BS present. Ext: No pitting leg edema bilaterally. 2+DP/PT pulse bilaterally. Musculoskeletal: No joint deformities, No joint redness or warmth, no limitation of ROM in spin. Skin: No rashes.  Neuro: Alert, oriented X3, cranial nerves II-XII grossly intact, muscle strength 5/5 in all extremities, sensation to light touch intact. Brachial reflex 1+ bilaterally. Knee reflex 1+ bilaterally. Negative Babinski's sign. Normal finger to nose test. Psych: Patient is not psychotic, no suicidal or hemocidal ideation.  Labs on Admission:  Basic Metabolic Panel:  Recent Labs Lab 05/11/15 1930  NA 136  K 5.0  CL 100*  CO2 27  GLUCOSE 254*   BUN 41*  CREATININE 1.25*  CALCIUM 9.5  MG 2.4   Liver Function Tests: No results for input(s): AST, ALT, ALKPHOS, BILITOT, PROT, ALBUMIN in the last 168 hours. No results for input(s): LIPASE, AMYLASE in the last 168 hours. No results for input(s): AMMONIA in the last 168 hours. CBC:  Recent Labs Lab 05/11/15 1930  WBC 10.9*  HGB 14.9  HCT 46.1*  MCV 84.3  PLT 290   Cardiac Enzymes: No results for input(s): CKTOTAL, CKMB, CKMBINDEX, TROPONINI in the last 168 hours.  BNP (last 3 results)  Recent Labs  03/30/15 2344 04/13/15 1355  BNP 630.5* 740.3*    ProBNP (last 3 results) No results for input(s): PROBNP in the last 8760 hours.  CBG:  Recent Labs Lab 05/11/15 1928  GLUCAP 232*    Radiological Exams on Admission: No results found.  EKG: Independently reviewed.  Abnormal findings: Pacer rhythm   Assessment/Plan Principal Problem:   Syncope Active Problems:   Diabetes mellitus type 2, noninsulin dependent ([name])   Hypertrophic cardiomyopathy   VT (ventricular tachycardia) ([name])   Atrial fibrillation, unspecified   CAD (coronary artery disease), native coronary artery - 3 vessel   Chronic diastolic CHF (congestive heart failure) ([name])   Hypothyroidism   HLD (hyperlipidemia)   Nausea & vomiting   Abdominal pain   Syncope: Etiology is not clear. Differential diagnoses include orthostatic status (patient is clinically dehydrated secondary to nausea, vomiting and continuation of diuretics), TIA/stroke and cardiac arrhythmia. Pt does not have CP, she was on Xarelto (only missed one dose yesterday), unlikely to have pulmonary embolism. Cardiology was consulted by ED.  - will admit to tele bed - admit to tele bed - Orthostatic vital signs  - CT-head w/o CM - Neuro checks  - PT/OT eval and treat - f/u cardiologist recommendation - IVF: NS 500 cc and then 75 cc/h  Nausea, vomiting and abdominal pain: etiology is not clear. Differential diagnosis  includes viral gastritis, pancreatitis and small bowel obstruction. -will check lipase -CT-abd/pelvis w/o CM -IVF as above  DM-II: Last A1c 7.5 on 04/22/15,  fairly controled. Patient is taking metformin at home -SSI  Hx of VT: s/p of AICD. -monitoring on tele  Atrial Fibrillation: CHA2DS2-VASc Score is 4, needs oral anticoagulation. Patient is on Xarelto at home. Heart rate is well controlled. Patient missed 1 dose of Xarelto yesterday. -Cont Xarelto -continue metoprolol  CAD (coronary artery disease), native coronary artery - 3 vessel: No  chest pain. Troponin negative in emergency room. -continue lipitor and metoprol  Chronic diastolic CHF (congestive heart failure) ([place]): 2-D echo on 03/31/15 showed EF of 55-60%. Patient is on Lasix at home. She is clinically dry. -Hold Lasix due to AKI -check BNP in AM  Hypothyroidism: Last TSH was 10.10 on 04/26/15. Patient has not been taking her Synthroid since 9/26 -Resume synthroid, give half home does by IV, 25 mcg daily. -Check TSH in 6-8 weeks  HLD: Last LDL was 98 on 12/31/12 -Continue home medications: Lipitor   DVT ppx: on xarelto  Code Status: Full code Family Communication: None at bed side.   Disposition Plan: Admit to inpatient   Date of Service 05/11/2015    Ivor Costa [facility] Pager [phone]  If 7PM-7AM, please contact night-coverage [email or web address] Password [email or web address] 05/11/2015, 11:11 PM

## 2015-05-11 NOTE — Consult Note (Signed)
CARDIOLOGY CONSULT NOTE   Patient ID: [name] MRN: [id number], DOB/AGE: 11-Nov-1945   Admit date: 05/11/2015 Date of Consult: 05/11/2015   Primary Physician: Lujean Amel, MD Primary Cardiologist: Melina Copa  Pt. Profile  Pt is a 69 yo F w/ a PMH significant for apical variant HCM, 3vCAD, paroxysmal afib, and VT s/p ICD for secondary prevention coming in with syncope X 1 week.  Problem List  Past Medical History  Diagnosis Date  . Diabetes mellitus without complication ([place])     dx on wed  . Paroxysmal atrial fibrillation ([name])   . Hypertrophic cardiomyopathy ([place])   . Left atrial enlargement   . Tricuspid regurgitation     a. Echo 04/2015: Mod-severe TR.  [name] Ventricular tachycardia ([place])     a. s/p St[employer] Jude ICD implanted 04/2015.  [name] CAD (coronary artery disease), native coronary artery - 3 vessel 04/21/2015    a. NSTEMI 8-04/2015 felt 2/2 demand ischemia. b. 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  . Abnormal PFT   . Chronic diastolic CHF (congestive heart failure) ([place])   . Mitral regurgitation     a. Echo 04/2015: moderate mitral regurgitation.  . Diabetes mellitus [facility])     Past Surgical History  Procedure Laterality Date  . Dental surgery  Oct. 2011    several extractions, bone graft  . I&d extremity Left 01/06/2013    Procedure: IRRIGATION AND DEBRIDEMENT LEFT ELBOW AND LEFT FOREARM ;  Surgeon: [name], MD;  Location: [facility];  Service: Orthopedics;  Laterality: Left;  . Application of wound vac Left 01/06/2013    Procedure: APPLICATION OF WOUND VAC X 2;  Surgeon: [name], MD;  Location: [facility];  Service: Orthopedics;  Laterality: Left;  left forearm  . Incision and drainage Left 01/09/2013    Procedure: REDO INCISION AND DRAINAGE LEFT ELBOW;  Surgeon: [name], MD;  Location: [facility];  Service: Orthopedics;  Laterality: Left;  SUPINE, UPPER EXTERMITY DRAPE  . Secondary closure of wound Left  01/09/2013    Procedure: SECONDARY CLOSURE OF WOUND  LEFT ELBOW;  Surgeon: [name], MD;  Location: [facility];  Service: Orthopedics;  Laterality: Left;  . Application of wound vac Left 01/09/2013    Procedure: APPLICATION OF WOUND VAC;  Surgeon: [name], MD;  Location: [facility];  Service: Orthopedics;  Laterality: Left;  . Ep implantable device N/A 04/16/2015    Procedure: ICD Implant;  Surgeon: [name], MD;  Location: [place] [facility];  Service: Cardiovascular;  Laterality: N/A;  . Cardiac catheterization N/A 04/19/2015    Procedure: Left Heart Cath and Coronary Angiography;  Surgeon: [name], MD;  Location: [place] [facility];  Service: Cardiovascular;  Laterality: N/A;    Allergies  No Known Allergies  HPI   Pt has multiple recent hospitalizations in the past month. Most recently for urosepsis. Has been feeling generally unwell with poor appetite/PO intake and n/v since discharge. For past week has been "passing out" daily. Occurs when going from seated to standing. Feels lightheaded, nauseated, and reports LOC. Thinks episode lasts on the order of minutes. Unwitnessed. Denies CP or SOB. Feels fatigued afterwards but otherwise unremarkable. No tonic-clonic jerks or hx of seizure. Prior VT did not lead to syncope. Has not had any VT or ICD firings per pt. Denies f/c/ns. No other complaints.  Inpatient Medications  . amiodarone  200 mg Oral BID  . [  START ON 05/12/2015] atorvastatin  40 mg Oral q1800  . [START ON 05/12/2015] levothyroxine  50 mcg Oral Daily  . metoprolol succinate  50 mg Oral BID  .  morphine injection  4 mg Intravenous Once  . rivaroxaban  20 mg Oral Daily  . sodium chloride  3 mL Intravenous Q12H   Family History Family History  Problem Relation Age of Onset  . Cancer Mother   . Diabetes Father     Social History Social History   Social History  . Marital Status: Single    Spouse Name: N/A  . Number of Children: N/A  . Years of Education:  N/A   Occupational History  . Not on file.   Social History Main Topics  . Smoking status: Former Smoker -- 4 years    Quit date: 12/31/1980  . Smokeless tobacco: Never Used  . Alcohol Use: No  . Drug Use: No  . Sexual Activity: Not on file   Other Topics Concern  . Not on file   Social History Narrative   Divorced.  Lives with daughter and grandson. 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    Review of Systems  General:  No chills, fever, night sweats or weight changes Cardiovascular:  Per HPI Dermatological: No rash, lesions/masses Respiratory: No cough, dyspnea Urologic: No hematuria, dysuria Abdominal:   No nausea, vomiting, diarrhea, bright red blood per rectum, melena, or hematemesis Neurologic:  No visual changes, wkns, changes in mental status All other systems reviewed and are otherwise negative except as noted above  Physical Exam  Blood pressure 159/71, pulse 65, temperature 97.4 F (36.3 C), temperature source Oral, resp. rate 16, SpO2 99 %.  General: Pleasant, NAD Psych: Normal affect Neuro: Alert and oriented X 3. Moves all extremities spontaneously HEENT: Normal  Neck: Supple without bruits or JVD Lungs:  Resp regular and unlabored, CTA Heart: RRR no s3, s4, or murmurs. Abdomen: Soft, non-tender, non-distended, BS + x 4 Extremities: No clubbing, cyanosis or edema. DP/PT/Radials 2+ and equal bilaterally  Labs  No results for input(s): CKTOTAL, CKMB, TROPONINI in the last 72 hours. Lab Results  Component Value Date   WBC 10.9* 05/11/2015   HGB 14.9 05/11/2015   HCT 46.1* 05/11/2015   MCV 84.3 05/11/2015   PLT 290 05/11/2015    Recent Labs Lab 05/11/15 1930  NA 136  K 5.0  CL 100*  CO2 27  BUN 41*  CREATININE 1.25*  CALCIUM 9.5  GLUCOSE 254*   Lab Results  Component Value Date   CHOL 133 12/31/2012   HDL 8* 12/31/2012   LDLCALC 98 12/31/2012   TRIG 137 12/31/2012   No results found for: DDIMER  Radiology/Studies  Dg Chest 2  View  04/17/2015   CLINICAL DATA:  Cardiac pacer.  EXAM: CHEST  2 VIEW  COMPARISON:  CT 04/16/2015.  Chest x-ray 04/14/2015.  FINDINGS: Mediastinum and hilar structures normal. Cardiac pacer with lead tips in right atrium right ventricle . Cardiomegaly with pulmonary vascular prominence and interstitial prominence bilateral effusions. Findings consistent congestive heart failure. Findings have improved scratched from prior study of 04/14/2015  IMPRESSION: Congestive heart failure with pulmonary interstitial edema and bilateral effusions. Slight improvement from prior exam. Cardiac pacer in stable position.   Electronically Signed   By: Marcello Moores  Register   On: 04/17/2015 09:21   Nm Myocar Multi W/spect W/wall Motion / Ef  04/17/2015   CLINICAL DATA:  Shortness of breath, V-tach, left atrial enlargement, mitral  valve prolapse, hypertrophic cardiomyopathy  EXAM: MYOCARDIAL IMAGING WITH SPECT (REST AND PHARMACOLOGIC-STRESS)  GATED LEFT VENTRICULAR WALL MOTION STUDY  LEFT VENTRICULAR EJECTION FRACTION  TECHNIQUE: Standard myocardial SPECT imaging was performed after resting intravenous injection of 10 mCi Tc-109m sestamibi. Subsequently, intravenous infusion of Lexiscan was performed under the supervision of the Cardiology staff. At peak effect of the drug, 30 mCi Tc-58m sestamibi was injected intravenously and standard myocardial SPECT imaging was performed. Quantitative gated imaging was also performed to evaluate left ventricular wall motion, and estimate left ventricular ejection fraction.  COMPARISON:  None.  FINDINGS: Perfusion: Small to medium sized, mild severity fixed defect along the inferolateral wall, suspicious for prior infarct, with associated mild reversible peri-lesional ischemia. Additional small, mild reversible defect along the anterior wall.  Wall Motion: Hypokinesia involving the inferolateral wall. No left ventricular dilation.  Left Ventricular Ejection Fraction: 34 %  End diastolic volume 89 ml   End systolic volume 58 ml  IMPRESSION: 1. Small, mild reversible defect along the anterior wall of the left ventricle, possibly reflecting acute ischemia. Additional small to medium-sized fixed defect involving the inferolateral wall, suggesting prior infarct, but with possible mild peri-lesional ischemia.  2. Associated hypokinesia involving the inferolateral wall of the left ventricle.  3.  Decreased left ventricular ejection fraction of 34%.  4. High-risk stress test findings*.  *2012 Appropriate Use Criteria for Coronary Revascularization Focused Update: J Am Coll Cardiol. 8756;43(3):295-188. http://content.airportbarriers.com.aspx?articleid=1201161  These results will be called to the ordering clinician or representative by the Radiologist Assistant, and communication documented in the PACS or zVision Dashboard.   Electronically Signed   By: Julian Hy M.D.   On: 04/17/2015 15:29   Ct Coronary Morp W/cta Cor W/score W/ca W/cm &/or Wo/cm  04/16/2015   ADDENDUM REPORT: 04/16/2015 17:56  CLINICAL DATA:  69 year old female with hypertrophic cardiomyopathy who presented with VT storm. Evaluate for obstructive CAD.  EXAM: Cardiac/Coronary  CT  TECHNIQUE: The patient was scanned on a Philips 256 scanner.  FINDINGS: A 120 kV prospective scan was triggered in the descending thoracic aorta at 111 HU's. Axial non-contrast 3 mm slices were carried out through the heart. The data set was analyzed on a dedicated work station and scored using the [place]. Gantry rotation speed was 270 msecs and collimation was .9 mm. No beta blockade and 0.4 mg of sl NTG was given. The 3D data set was reconstructed in 5% intervals of the 67-82 % of the R-R cycle. Diastolic phases were analyzed on a dedicated work station using MPR, MIP and VRT modes. The patient received 80 cc of contrast.  Aorta:  Normal caliber, mild diffuse calcifications.  No dissection.  Aortic Valve:  Trileaflet.  Minimal calcifications of the  leaflets.  Coronary Arteries:  Normal origin.  Right dominance.  The quality is affected by significant ectopy during acquisition resulting in motion.  RCA is a large dominant vessel. There appears to be diffuse mixed plaque but no severe stenosis.  Left main is a long and large vessel that has minimal calcified plaque in its distal portion associated with 0-25% stenosis.  LAD is a large vessel that has severe diffuse calcified plaque predominantly in the proximal and mid segment. There is moderate calcified lesion in the proximal segment associated with 50-69% stenosis. In the mid LAD there are two segments of mixed, predominantly calcified plaque with possible stenosis > stenosis. The more accurate estimate not possible secondary to motion artifact.  There is a diagonal branch originating of mid  LAD with possible severe stenosis in the proximal segment.  A small ramus intermedius has mild calcified plaque in the proximal segment.  LCX artery is medium caliber vessel that gives rise to one small obtuse marginal branch. A minimal calcified plaque associated with 0-25% stenosis is present in the proximal segment.  IMPRESSION: 1. Coronary calcium score of 498. 416 is located in the proximal to mid LAD. This was 49 percentile for age and sex matched control.  2. The quality of the study is affected by significant ectopy and motion.  3. Normal coronary origin with right dominance.  4. There is moderate diffuse mixed, predominantly calcified plaque in LAD.  5. There is suspicion for severe stenosis in the mid LAD and in the first diagonal branch. Because the study quality is affected by ectopy and motion a functional study with a stress test is recommended to evaluate for ischemia in this territory.  Ena Dawley   Electronically Signed   By: Ena Dawley   On: 04/16/2015 17:56   04/16/2015   EXAM: OVER-READ INTERPRETATION  CT CHEST  The following report is an over-read performed by radiologist Dr. Forest Gleason  [facility] on 04/16/2015. This over-read does not include interpretation of cardiac or coronary anatomy or pathology. The CTA interpretation by the cardiologist is attached.  COMPARISON:  Plain film of 04/14/2015  FINDINGS: Mediastinum/Nodes: Pulmonary artery enlargement, outflow tract 3.8 cm. precarinal node measures 1.2 cm on image 3 of series 504. No gross hilar adenopathy.  Lungs/Pleura: Small right larger than left pleural effusions. Heterogeneous ground-glass opacities throughout both lungs with interlobular septal thickening. Bibasilar dependent airspace disease which is likely atelectasis, including within the posterior lingula.  Upper abdomen: Normal imaged portions of the liver, spleen, stomach.  Musculoskeletal: No acute osseous abnormality.  IMPRESSION: 1. Congestive heart failure, with bilateral pleural effusions, septal thickening, and ground-glass opacities. 2. Pulmonary artery enlargement suggests pulmonary arterial hypertension. 3. Mild precarinal adenopathy. This could also relate to congestive heart failure. Standard chest CT follow-up at 3 months could confirm stability or resolution.  Electronically Signed: By: Abigail Miyamoto M.D. On: 04/16/2015 15:04   Dg Chest Port 1 View  04/14/2015   CLINICAL DATA:  Acute respiratory failure.  Diabetes mellitus.  EXAM: PORTABLE CHEST - 1 VIEW  COMPARISON:  04/13/2015.  FINDINGS: Cardiomegaly persists. BILATERAL pulmonary edema is redemonstrated, perhaps slightly improved with regard interstitial prominence. Large LEFT pleural effusion is now observed. Overlying telemetry leads.  IMPRESSION: Slight improvement in pulmonary edema, with increasing LEFT pleural effusion. Cardiomegaly.   Electronically Signed   By: Staci Righter M.D.   On: 04/14/2015 10:26   Dg Chest Port 1 View  04/13/2015   CLINICAL DATA:  Chest pain  EXAM: PORTABLE CHEST - 1 VIEW  COMPARISON:  03/31/2015  FINDINGS: Cardiac enlargement. Diffuse bilateral airspace disease has  progressed since the prior study most consistent with pulmonary edema. Small bilateral pleural effusions with bibasilar atelectasis.  IMPRESSION: Congestive heart failure with bilateral edema and small pleural effusions. Worsening of edema since the prior study.   Electronically Signed   By: Franchot Gallo M.D.   On: 04/13/2015 14:02    ECG  EKG - NSR, no acute ischemic changes  Echo: - Left ventricle: The cavity size was normal. Systolic function was normal. The estimated ejection fraction was in the range of 55% to 60%. Wall motion was normal; there were no regional wall motion abnormalities. The study was not technically sufficient to allow evaluation of LV  diastolic dysfunction due to atrial fibrillation. Doppler parameters are consistent with both elevated ventricular end-diastolic filling pressure and elevated left atrial filling pressure. However, given elevated LVEDP and LA pressures with severe left atrial enlargement, diastolic dysfunction is likely. Mild concentric and severe focal basal septal hypertrophy. - Mitral valve: Moderately calcified annulus. Mildly thickened leaflets . There was moderate regurgitation. - Left atrium: The atrium was severely dilated. - Right ventricle: Systolic function was moderately reduced. - Right atrium: The atrium was mildly to moderately dilated. - Tricuspid valve: There was moderate-severe regurgitation. - Pulmonic valve: There was mild regurgitation. - Pulmonary arteries: PASP 40 mmHg. Systolic pressure was mildly increased.  ASSESSMENT AND PLAN Pt is a 69 yo F w/ a PMH significant for apical variant HCM, 3vCAD, paroxysmal afib, and VT s/p ICD for secondary prevention coming in with syncope X 1 week.  #Syncope. DDx includes cardiac (i.e. LVOT obstruction, valvular, effusion, ACS, ventricular arrhythmia) vs. orthostatic hypotension/vasovagal/deconditioning vs. primary neurologic. Prior echos unremarkable. No hx of  VT/ICD firing since device placed. Given multiple hospitalizations and hx favor orthostatic hypotension/vasovagal in the setting of multiple recent hospitalizations and deconditioning. -admit to telemetry -serial CE/EKG -echo in the AM -interrogate ICD -500 CC NS PIVB  Thank you for this interesting consult. We will continue to follow along with you.  Signed, Bari Edward, MD 05/11/2015, 10:45 PM

## 2015-05-11 NOTE — ED Notes (Signed)
[employer]. Jude pacemaker interrogated by Agricultural consultant

## 2015-05-12 ENCOUNTER — Inpatient Hospital Stay (HOSPITAL_COMMUNITY): Payer: Medicare Other

## 2015-05-12 ENCOUNTER — Encounter (HOSPITAL_COMMUNITY): Payer: Self-pay | Admitting: General Practice

## 2015-05-12 DIAGNOSIS — I4891 Unspecified atrial fibrillation: Secondary | ICD-10-CM

## 2015-05-12 DIAGNOSIS — R112 Nausea with vomiting, unspecified: Secondary | ICD-10-CM

## 2015-05-12 DIAGNOSIS — N179 Acute kidney failure, unspecified: Secondary | ICD-10-CM

## 2015-05-12 DIAGNOSIS — R079 Chest pain, unspecified: Secondary | ICD-10-CM

## 2015-05-12 DIAGNOSIS — Z95810 Presence of automatic (implantable) cardiac defibrillator: Secondary | ICD-10-CM | POA: Insufficient documentation

## 2015-05-12 DIAGNOSIS — I25110 Atherosclerotic heart disease of native coronary artery with unstable angina pectoris: Secondary | ICD-10-CM

## 2015-05-12 DIAGNOSIS — E119 Type 2 diabetes mellitus without complications: Secondary | ICD-10-CM

## 2015-05-12 DIAGNOSIS — E039 Hypothyroidism, unspecified: Secondary | ICD-10-CM

## 2015-05-12 DIAGNOSIS — R55 Syncope and collapse: Secondary | ICD-10-CM

## 2015-05-12 LAB — GLUCOSE, CAPILLARY
GLUCOSE-CAPILLARY: 147 mg/dL — AB (ref 65–99)
GLUCOSE-CAPILLARY: 136 mg/dL — AB (ref 65–99)
Glucose-Capillary: 179 mg/dL — ABNORMAL HIGH (ref 65–99)
Glucose-Capillary: 211 mg/dL — ABNORMAL HIGH (ref 65–99)

## 2015-05-12 LAB — TROPONIN I
TROPONIN I: 0.06 ng/mL — AB (ref <0.031)
Troponin I: 0.05 ng/mL — ABNORMAL HIGH
Troponin I: 0.05 ng/mL — ABNORMAL HIGH

## 2015-05-12 LAB — RAPID URINE DRUG SCREEN, HOSP PERFORMED
Amphetamines: NOT DETECTED
BARBITURATES: NOT DETECTED
BENZODIAZEPINES: NOT DETECTED
Cocaine: NOT DETECTED
Opiates: NOT DETECTED
TETRAHYDROCANNABINOL: NOT DETECTED

## 2015-05-12 LAB — BRAIN NATRIURETIC PEPTIDE: B NATRIURETIC PEPTIDE 5: 299.5 pg/mL — AB (ref 0.0–100.0)

## 2015-05-12 LAB — PROTIME-INR
INR: 1.28 (ref 0.00–1.49)
Prothrombin Time: 16.1 seconds — ABNORMAL HIGH (ref 11.6–15.2)

## 2015-05-12 LAB — CBC
HCT: 45.5 % (ref 36.0–46.0)
Hemoglobin: 14.7 g/dL (ref 12.0–15.0)
MCH: 27.2 pg (ref 26.0–34.0)
MCHC: 32.3 g/dL (ref 30.0–36.0)
MCV: 84.3 fL (ref 78.0–100.0)
PLATELETS: 270 10*3/uL (ref 150–400)
RBC: 5.4 MIL/uL — ABNORMAL HIGH (ref 3.87–5.11)
RDW: 14.9 % (ref 11.5–15.5)
WBC: 10.2 10*3/uL (ref 4.0–10.5)

## 2015-05-12 LAB — COMPREHENSIVE METABOLIC PANEL
ALBUMIN: 3.6 g/dL (ref 3.5–5.0)
ALK PHOS: 81 U/L (ref 38–126)
ALT: 16 U/L (ref 14–54)
AST: 18 U/L (ref 15–41)
Anion gap: 11 (ref 5–15)
BUN: 39 mg/dL — AB (ref 6–20)
CALCIUM: 9.2 mg/dL (ref 8.9–10.3)
CO2: 21 mmol/L — AB (ref 22–32)
CREATININE: 1.21 mg/dL — AB (ref 0.44–1.00)
Chloride: 105 mmol/L (ref 101–111)
GFR calc Af Amer: 52 mL/min — ABNORMAL LOW (ref >60)
GFR calc non Af Amer: 45 mL/min — ABNORMAL LOW (ref >60)
GLUCOSE: 201 mg/dL — AB (ref 65–99)
Potassium: 4.1 mmol/L (ref 3.5–5.1)
SODIUM: 137 mmol/L (ref 135–145)
Total Bilirubin: 0.5 mg/dL (ref 0.3–1.2)
Total Protein: 6.8 g/dL (ref 6.5–8.1)

## 2015-05-12 LAB — LIPASE, BLOOD: Lipase: 48 U/L (ref 22–51)

## 2015-05-12 LAB — APTT: aPTT: 29 seconds (ref 24–37)

## 2015-05-12 LAB — CREATININE, URINE, RANDOM: Creatinine, Urine: 70.61 mg/dL

## 2015-05-12 MED ORDER — METOCLOPRAMIDE HCL 5 MG/ML IJ SOLN: 5.0000 mg | Freq: Four times a day (QID) | INTRAMUSCULAR | Status: DC | PRN

## 2015-05-12 MED ORDER — LEVOTHYROXINE SODIUM 100 MCG IV SOLR
25.0000 ug | Freq: Every day | INTRAVENOUS | Status: DC
  Administered 2015-05-12 – 2015-05-13 (×2): 25 ug via INTRAVENOUS
  Filled 2015-05-12 (×2): qty 5

## 2015-05-12 MED ORDER — METOPROLOL SUCCINATE ER 25 MG PO TB24: 25.0000 mg | ORAL_TABLET | Freq: Two times a day (BID) | ORAL | Status: DC

## 2015-05-12 MED ORDER — SODIUM CHLORIDE 0.9 % IV SOLN
INTRAVENOUS | Status: DC
  Administered 2015-05-12 – 2015-05-13 (×2): via INTRAVENOUS

## 2015-05-12 MED ORDER — METOCLOPRAMIDE HCL 5 MG/ML IJ SOLN
5.0000 mg | Freq: Once | INTRAMUSCULAR | Status: AC
  Administered 2015-05-12: 5 mg via INTRAVENOUS
  Filled 2015-05-12: qty 2

## 2015-05-12 MED ORDER — PROMETHAZINE HCL 25 MG/ML IJ SOLN
12.5000 mg | Freq: Once | INTRAMUSCULAR | Status: DC
  Filled 2015-05-12: qty 1

## 2015-05-12 MED ORDER — LEVOTHYROXINE SODIUM 50 MCG PO TABS: 50.0000 ug | ORAL_TABLET | Freq: Every day | ORAL | Status: DC

## 2015-05-12 NOTE — Progress Notes (Addendum)
Patient Name: [name] Date of Encounter: 05/12/2015  Principal Problem:   Syncope Active Problems:   Diabetes mellitus type 2, noninsulin dependent ([place])   Hypertrophic cardiomyopathy   VT (ventricular tachycardia) ([name])   Atrial fibrillation, unspecified   CAD (coronary artery disease), native coronary artery - 3 vessel   Chronic diastolic CHF (congestive heart failure) ([name])   Hypothyroidism   HLD (hyperlipidemia)   Nausea & vomiting   Abdominal pain   Length of Stay: 1  SUBJECTIVE  No more syncope. Refusing to take metoprolol and amiodarone, states that she regrets getting a BiV/ICD  CURRENT MEDS . amiodarone  200 mg Oral BID  . atorvastatin  40 mg Oral q1800  . insulin aspart  0-9 Units Subcutaneous TID WC  . levothyroxine  25 mcg Intravenous Daily  . metoprolol succinate  50 mg Oral BID  .  morphine injection  4 mg Intravenous Once  . promethazine  12.5 mg Intravenous Once  . rivaroxaban  20 mg Oral Q supper  . sodium chloride  3 mL Intravenous Q12H   OBJECTIVE  Filed Vitals:   05/12/15 1027 05/12/15 1029 05/12/15 1032 05/12/15 1045  BP: 156/74 117/70 96/46 115/53  Pulse: 72 68 68 66  Temp:      TempSrc:      Resp:      Height:      Weight:      SpO2: 95% 97% 97%     Intake/Output Summary (Last 24 hours) at 05/12/15 1119 Last data filed at 05/12/15 1046  Gross per 24 hour  Intake 394.25 ml  Output    400 ml  Net  -5.75 ml   Filed Weights   05/12/15 0024  Weight: 153 lb 9.6 oz (69.673 kg)    PHYSICAL EXAM  General: Pleasant, NAD. Neuro: Alert and oriented X 3. Moves all extremities spontaneously. Psych: Normal affect. HEENT:  Normal  Neck: Supple without bruits or JVD. Lungs:  Resp regular and unlabored, CTA. Heart: RRR no s3, s4, or murmurs. Abdomen: Soft, non-tender, non-distended, BS + x 4.  Extremities: No clubbing, cyanosis or edema. DP/PT/Radials 2+ and equal bilaterally.  Accessory Clinical Findings  CBC  Recent  Labs  05/11/15 1930 05/12/15 0154  WBC 10.9* 10.2  HGB 14.9 14.7  HCT 46.1* 45.5  MCV 84.3 84.3  PLT 290 867   Basic Metabolic Panel  Recent Labs  05/11/15 1930 05/12/15 0154  NA 136 137  K 5.0 4.1  CL 100* 105  CO2 27 21*  GLUCOSE 254* 201*  BUN 41* 39*  CREATININE 1.25* 1.21*  CALCIUM 9.5 9.2  MG 2.4  --    Liver Function Tests  Recent Labs  05/12/15 0154  AST 18  ALT 16  ALKPHOS 81  BILITOT 0.5  PROT 6.8  ALBUMIN 3.6    Recent Labs  05/12/15 0154  LIPASE 48   Cardiac Enzymes  Recent Labs  05/12/15 0154 05/12/15 0727  TROPONINI 0.05* 0.06*   Nm Myocar Multi W/spect W/wall Motion / Ef  04/17/2015   CLINICAL DATA:  Shortness of breath, V-tach, left atrial enlargement, mitral valve prolapse, hypertrophic cardiomyopathy   IMPRESSION: 1. Small, mild reversible defect along the anterior wall of the left ventricle, possibly reflecting acute ischemia. Additional small to medium-sized fixed defect involving the inferolateral wall, suggesting prior infarct, but with possible mild peri-lesional ischemia.  2. Associated hypokinesia involving the inferolateral wall of the left ventricle.  3.  Decreased left ventricular ejection fraction  of 34%.  4. High-risk stress test findings*. On: 04/17/2015 15:29   Ct Coronary Morp W/cta Cor W/score W/ca W/cm &/or Wo/cm  04/16/2015   ADDENDUM REPORT: 04/16/2015 17:56  CLINICAL DATA:  69 year old female with hypertrophic cardiomyopathy who presented with VT storm. Evaluate for obstructive CAD. IMPRESSION: 1. Coronary calcium score of 498. 416 is located in the proximal to mid LAD. This was 70 percentile for age and sex matched control.  2. The quality of the study is affected by significant ectopy and motion.  3. Normal coronary origin with right dominance.  4. There is moderate diffuse mixed, predominantly calcified plaque in LAD.  5. There is suspicion for severe stenosis in the mid LAD and in the first diagonal branch. Because the  study quality is affected by ectopy and motion a functional study with a stress test is recommended to evaluate for ischemia in this territory.  Ena Dawley   Electronically Signed   By: Ena Dawley   On: 04/16/2015 17:56   TELE: SR  ECG: atril pacing, nonspecific T wave abnormalities.    ASSESSMENT AND PLAN  1. Recurrent daily syncope - after standing up from sitting position - h/o paroxysmal VT - s/p ICD. I have contacted [employer], the patient was not transmitting from home, so they don't have any information. Orvil Feil from [place] will come to interrogate later today. If VT we will ask EP to manage. If no VT, we will consider to decrease metoprolol dose.  Electrolytes are all ok.  Telemetry shows SR only.  2. Elevated troponin - minimal with flat trend with CKD stage 3  3. Paroxysmal atrial fibrillation - atrial paced today, appears to be in NSR. Interrogation did show occasional episode of atrial fib (last on 9/17 - for 1 day 18 hours) - 21% afib since 04/17/15. Symptomatically improved. Continue current regimen.  4. Chronic diastolic CHF - appears euvolemic. BP running slightly higher (146/86 with recheck 140/72). Her BP was actually quite low in the hospital. Since she is having issues with nausea I do not want to complicate the picture too much - I have asked her to follow her BP at home 1-2x/day over the next week and call us with result. Based on her results would consider initiation of ACEI. If BP remain stable at home will also plan to rx SL NTG PRN as this is not currently on her med list.  5. CAD in native artery - not on ASA due to being on Xarelto. Will have her see MD in 6-8 weeks to revisit issue of possibly needing CABG. No angina. Continue BB. Add statin and check lipids/LFTs at time of f/u appointment.  6. Abnormal TSH - recheck today. Will need to be periodically assessed on amiodarone.   Signed, Ena Dawley H MD, [facility] 05/12/2015  Pacemaker interrogation  shows VT and no atrial fibrillation. I will decrease metoprolol to 25 mg po BID.  Dorothy Spark 05/12/2015

## 2015-05-12 NOTE — Evaluation (Signed)
Physical Therapy Evaluation Patient Details Name: [name] MRN: [id number] DOB: 1945/10/06 Today's Date: 05/12/2015   History of Present Illness  Patient with multiple hospitalizations over past month, recent falls at home secondary to syncope.  Clinical Impression  Patient with limited mobility secondary to lightheadedness  Although slight pallor, BP readings in sitting after gait:  126/64, HR 66 bpm.  Patient at fall risk secondary to syncope and h/o falls.  Patient began using RW recently to manage falls. She had been performing all instrumental ADLs until recent hospitalizations.  Will continue to follow patient while on this venue of care to progress mobility.     Follow Up Recommendations Home health PT;Supervision for mobility/OOB    Equipment Recommendations  None recommended by PT    Recommendations for Other Services       Precautions / Restrictions Precautions Precautions: Fall Precaution Comments: h/o falls Restrictions Weight Bearing Restrictions: No      Mobility  Bed Mobility Overal bed mobility: Independent             General bed mobility comments: with raised HOB  Transfers Overall transfer level: Independent Equipment used: Rolling walker (2 wheeled)                Ambulation/Gait Ambulation/Gait assistance: Supervision Ambulation Distance (Feet): 70 Feet Assistive device: Rolling walker (2 wheeled) Gait Pattern/deviations: WFL(Within Functional Limits);Decreased step length - right;Decreased step length - left     General Gait Details: no c/o lightheadedness during gait or standing  Stairs            Wheelchair Mobility    Modified Rankin (Stroke Patients Only)       Balance Overall balance assessment: Modified Independent                                           Pertinent Vitals/Pain Pain Assessment: No/denies pain    Home Living Family/patient expects to be discharged to:: Private  residence Living Arrangements: Other relatives Available Help at Discharge: Family;Available 24 hours/day Type of Home: House Home Access: Level entry     Home Layout: Two level;Other (Comment) (grandson lives upstairs, pt does not access) Home Equipment: Environmental consultant - 2 wheels;Cane - single point;Wheelchair - manual Additional Comments: began using RW recently secondary to falls    Prior Function Level of Independence: Independent with assistive device(s)         Comments: was independent with instrumental ADLs until recently, family now assists wtih laundry, cooking     Hand Dominance   Dominant Hand: Right    Extremity/Trunk Assessment   Upper Extremity Assessment: Overall WFL for tasks assessed           Lower Extremity Assessment: Generalized weakness      Cervical / Trunk Assessment: Normal  Communication   Communication: No difficulties  Cognition Arousal/Alertness: Awake/alert Behavior During Therapy: WFL for tasks assessed/performed Overall Cognitive Status: Within Functional Limits for tasks assessed                      General Comments General comments (skin integrity, edema, etc.): slight pallor, BP readings in sitting after gait:  126/64, HR 66 bpm    Exercises        Assessment/Plan    PT Assessment Patient needs continued PT services  PT Diagnosis Difficulty walking;Generalized weakness   PT Problem List Decreased strength;Decreased  activity tolerance;Decreased balance;Cardiopulmonary status limiting activity  PT Treatment Interventions DME instruction;Gait training;Functional mobility training;Therapeutic activities;Therapeutic exercise;Balance training;Patient/family education   PT Goals (Current goals can be found in the Care Plan section) Acute Rehab PT Goals Patient Stated Goal: return to doing things like I used to PT Goal Formulation: With patient Time For Goal Achievement: 05/26/15 Potential to Achieve Goals: Good     Frequency Min 3X/week   Barriers to discharge        Co-evaluation               End of Session Equipment Utilized During Treatment: Gait belt Activity Tolerance: Patient tolerated treatment well Patient left: in bed;with call bell/phone within reach;with bed alarm set Nurse Communication: Mobility status;Precautions         Time: 0626-9485 PT Time Calculation (min) (ACUTE ONLY): 16 min   Charges:   PT Evaluation $Initial PT Evaluation Tier I: 1 Procedure     PT G CodesMalka So, PT [phone]  [place] 05/12/2015, 12:00 PM

## 2015-05-12 NOTE — Progress Notes (Signed)
   05/12/15 0024  Vitals  Temp 97.8 F (36.6 C)  Temp Source Oral  BP 117/63 mmHg  BP Location Right Arm  BP Method Automatic  Patient Position (if appropriate) Sitting  Pulse Rate 64  Pulse Rate Source Dinamap  Resp 17  Oxygen Therapy  SpO2 97 %  O2 Device Room Air  Height and Weight  Weight 69.673 kg (153 lb 9.6 oz)  Type of Scale Used Standing  Type of Weight Actual  Admitted pt to rm 3E14 from ED, alert and oriented, denied pain at this, oriented to room, call bell placed within reach. Admission assessment done, orders carried out. Will continue to monitor.

## 2015-05-12 NOTE — Evaluation (Signed)
Occupational Therapy Evaluation Patient Details Name: [name] MRN: [id number] DOB: Nov 26, 1945 Today's Date: 05/12/2015    History of Present Illness 69 y.o. female with PMH of hyperlipidemia, diabetes mellitus, hypothyroidism, PAF on Xaretlo, TVR, MVR, diastolic congestive heart failure (EF of 55-60%), recently diagnosed 3V CAD + ventricular tachycardia s/p St. Jude ICD 04/2015, who presented with nausea, vomiting, abdominal pain, dizziness and syncope   Clinical Impression   Pt admitted with above. Pt independent with ADLs, but getting assist with IADLs, PTA. Feel pt will benefit from acute OT to increase independence and strength prior to d/c.     Follow Up Recommendations  No OT follow up;Supervision - Intermittent    Equipment Recommendations  None recommended by OT    Recommendations for Other Services       Precautions / Restrictions Precautions Precautions: Fall Precaution Comments: h/o falls Restrictions Weight Bearing Restrictions: No      Mobility Bed Mobility               General bed mobility comments: sitting EOB  Transfers Overall transfer level: Needs assistance   Transfers: Sit to/from Stand Sit to Stand: Supervision         General transfer comment: RW in front. Cues for hand placement; Pt stood and after a little bit starting swaying-Min guard, towards beginning of session.    Balance    History of falls.                                        ADL Overall ADL's : Needs assistance/impaired     Grooming: Brushing hair;Wash/dry face;Set up;Supervision/safety;Standing               Lower Body Dressing: Min guard;Sit to/from stand   Toilet Transfer: Min guard;Ambulation;RW (sit to stand from bed-supervision)           Functional mobility during ADLs: Min guard;Rolling walker General ADL Comments: Educated on safety such as rugs/items on floor, safe footwear, use of bag on walker, sitting for LB ADLs.  Educated on energy conservation techniques. Discussed reacher.      Vision     Perception     Praxis      Pertinent Vitals/Pain Pain Assessment: No/denies pain     Hand Dominance Right   Extremity/Trunk Assessment Upper Extremity Assessment Upper Extremity Assessment: Generalized weakness   Lower Extremity Assessment Lower Extremity Assessment: Defer to PT evaluation       Communication Communication Communication: No difficulties   Cognition Arousal/Alertness: Awake/alert Behavior During Therapy: WFL for tasks assessed/performed Overall Cognitive Status: Within Functional Limits for tasks assessed                     General Comments       Exercises       Shoulder Instructions      Home Living Family/patient expects to be discharged to:: Private residence Living Arrangements: Other relatives Available Help at Discharge: Family;Available 24 hours/day Type of Home: House Home Access: Level entry     Home Layout: Two level;Other (Comment) (grandson lives upstairs-pt does not access) Alternate Level Stairs-Number of Steps: 42       Bathroom Toilet: Standard     Home Equipment: Environmental consultant - 2 wheels;Cane - single point;Wheelchair - manual   Additional Comments: began using RW recently secondary to falls      Prior Functioning/Environment Level of  Independence: Needs assistance    ADL's / Homemaking Assistance Needed: pt recently getting assisting with cooking/cleaning        OT Diagnosis: Generalized weakness   OT Problem List: Decreased strength;Decreased activity tolerance;Decreased knowledge of use of DME or AE   OT Treatment/Interventions: Self-care/ADL training;DME and/or AE instruction;Therapeutic activities;Patient/family education;Balance training;Therapeutic exercise;Energy conservation    OT Goals(Current goals can be found in the care plan section) Acute Rehab OT Goals Patient Stated Goal: get back to cooking OT Goal  Formulation: With patient Time For Goal Achievement: 05/19/15 Potential to Achieve Goals: Good ADL Goals Pt Will Perform Lower Body Dressing: with modified independence;sit to/from stand Pt Will Transfer to Toilet: with modified independence;ambulating;regular height toilet Additional ADL Goal #1: Pt will independently perform HEP for bilateral UEs to increase strength.  OT Frequency: Min 2X/week   Barriers to D/C:            Co-evaluation              End of Session Equipment Utilized During Treatment: Gait belt;Rolling walker  Activity Tolerance: Patient tolerated treatment well Patient left: in bed;with call bell/phone within reach;with bed alarm set   Time: 4132-4401 OT Time Calculation (min): 17 min Charges:  OT General Charges $OT Visit: 1 Procedure OT Evaluation $Initial OT Evaluation Tier I: 1 Procedure G-CodesBenito Mccreedy OTR/L [id number] 05/12/2015, 5:17 PM

## 2015-05-12 NOTE — Progress Notes (Addendum)
[facility] PROGRESS NOTE  [name]:[id number] DOB: Mar 05, 1946 DOA: 05/11/2015  PCP: Lujean Amel, MD  Brief HPI: 69 year old Caucasian female with a past medical history of diabetes, hypothyroidism, paroxysmal atrial fibrillation on anticoagulation, triple  vessel coronary artery disease , currently on medical management, history of ventricular tachycardia status post ICD placement, presented with nausea, vomiting, abdominal pain, dizziness and syncope. She was discharged on September 19, after undergoing cardiac assessment as mentioned above.  Past medical history:  Past Medical History  Diagnosis Date  . Diabetes mellitus without complication ([place])     dx on wed  . Paroxysmal atrial fibrillation ([name])   . Hypertrophic cardiomyopathy ([place])   . Left atrial enlargement   . Tricuspid regurgitation     a. Echo 04/2015: Mod-severe TR.  [name] Ventricular tachycardia ([place])     a. s/p St. Jude ICD implanted 04/2015.  [name] CAD (coronary artery disease), native coronary artery - 3 vessel 04/21/2015    a. NSTEMI 8-04/2015 felt 2/2 demand ischemia. b. 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  . Abnormal PFT   . Chronic diastolic CHF (congestive heart failure) ([place])   . Mitral regurgitation     a. Echo 04/2015: moderate mitral regurgitation.  . Diabetes mellitus [facility])     Consultants: Cardiology  Procedures: None  Antibiotics: None  Subjective: Patient feels well this morning. Continues to have some nausea with the low volume, vomiting. Denies any blood in the emesis. This has been ongoing, just for the last couple of weeks, ever since she was started on her cardiac medications, especially amiodarone. Her syncope occurred twice and both times when she got up from a sitting or lying position. She denies any chest pain or shortness of breath currently.  Objective: Vital Signs  Filed Vitals:   05/11/15 2200 05/11/15 2230  05/12/15 0024 05/12/15 0556  BP: 153/76 159/71 117/63 112/65  Pulse: 67 65 64 64  Temp:   97.8 F (36.6 C) 98.1 F (36.7 C)  TempSrc:   Oral Oral  Resp: 14 16 17 19   Height:   5\' 4"  (1.626 m)   Weight:   69.673 kg (153 lb 9.6 oz)   SpO2: 98% 99% 97% 97%    Intake/Output Summary (Last 24 hours) at 05/12/15 0846 Last data filed at 05/12/15 6063  Gross per 24 hour  Intake 391.25 ml  Output    400 ml  Net  -8.75 ml   Filed Weights   05/12/15 0024  Weight: 69.673 kg (153 lb 9.6 oz)    General appearance: alert, cooperative, appears stated age and no distress Resp: clear to auscultation bilaterally Cardio: Has been S2 is normal, regular. No S3, S4. Systolic murmur appreciated the apex. No pedal edema. No JVD. GI: soft, non-tender; bowel sounds normal; no masses,  no organomegaly Extremities: extremities normal, atraumatic, no cyanosis or edema Neurologic: Alert and oriented 3. No focal neurological deficits noted.  Lab Results:  Basic Metabolic Panel:  Recent Labs Lab 05/11/15 1930 05/12/15 0154  NA 136 137  K 5.0 4.1  CL 100* 105  CO2 27 21*  GLUCOSE 254* 201*  BUN 41* 39*  CREATININE 1.25* 1.21*  CALCIUM 9.5 9.2  MG 2.4  --    Liver Function Tests:  Recent Labs Lab 05/12/15 0154  AST 18  ALT 16  ALKPHOS 81  BILITOT 0.5  PROT 6.8  ALBUMIN 3.6    Recent Labs Lab 05/12/15 0154  LIPASE 48   CBC:  Recent Labs Lab 05/11/15 1930 05/12/15 0154  WBC 10.9* 10.2  HGB 14.9 14.7  HCT 46.1* 45.5  MCV 84.3 84.3  PLT 290 270   Cardiac Enzymes:  Recent Labs Lab 05/12/15 0154 05/12/15 0727  TROPONINI 0.05* 0.06*   BNP (last 3 results)  Recent Labs  03/30/15 2344 04/13/15 1355 05/12/15 0154  BNP 630.5* 740.3* 299.5*   CBG:  Recent Labs Lab 05/11/15 1928 05/12/15 0618  GLUCAP 232* 147*     Studies/Results: Ct Abdomen Pelvis Wo Contrast  05/11/2015   CLINICAL DATA:  Acute onset of nausea, vomiting and generalized abdominal pain.  Initial encounter.  EXAM: CT ABDOMEN AND PELVIS WITHOUT CONTRAST  TECHNIQUE: Multidetector CT imaging of the abdomen and pelvis was performed following the standard protocol without IV contrast.  COMPARISON:  Abdominal radiograph performed 01/08/2013  FINDINGS: The visualized lung bases are clear. Mitral valve calcification is noted. An AICD lead is noted.  The liver and spleen are unremarkable in appearance. The gallbladder is within normal limits. The pancreas and adrenal glands are unremarkable.  Mild nonspecific perinephric stranding is noted bilaterally. The kidneys are otherwise unremarkable. There is no evidence of hydronephrosis. No renal or ureteral stones are identified.  No free fluid is identified. The small bowel is unremarkable in appearance. The stomach is within normal limits. No acute vascular abnormalities are seen. Mild scattered calcification is noted along the abdominal aorta and its branches.  The appendix is normal in caliber, without evidence of appendicitis. Scattered diverticulosis is noted along the ascending, descending and sigmoid colon, without evidence of diverticulitis.  The bladder is moderately distended and grossly unremarkable in appearance. The uterus is unremarkable in appearance. The ovaries are relatively symmetric. No suspicious adnexal masses are seen. No inguinal lymphadenopathy is seen.  No acute osseous abnormalities are identified. Vacuum phenomenon is noted at L5-S1.  IMPRESSION: 1. No acute abnormality seen within the abdomen or pelvis. 2. Mitral valve calcification noted. 3. Mild scattered calcification along the abdominal aorta and its branches. 4. Scattered diverticulosis along the ascending, descending and sigmoid colon, without evidence of diverticulitis.   Electronically Signed   By: Garald Balding M.D.   On: 05/11/2015 23:36   Ct Head Wo Contrast  05/11/2015   CLINICAL DATA:  Recent syncopal episodes.  EXAM: CT HEAD WITHOUT CONTRAST  TECHNIQUE: Contiguous  axial images were obtained from the base of the skull through the vertex without intravenous contrast.  COMPARISON:  None.  FINDINGS: There is no intracranial hemorrhage, mass or evidence of acute infarction. There is mild generalized atrophy. There is mild chronic microvascular ischemic change. There is no significant extra-axial fluid collection.  No acute intracranial findings are evident. No bone abnormality is evident. The visible paranasal sinuses are clear.  IMPRESSION: Mild generalized atrophy and chronic small vessel disease. No acute intracranial findings.   Electronically Signed   By: Andreas Newport M.D.   On: 05/11/2015 23:34   US Renal  05/12/2015   CLINICAL DATA:  Acute renal injury.  EXAM: RENAL / URINARY TRACT ULTRASOUND COMPLETE  COMPARISON:  CT of the abdomen pelvis dated 05/11/2015  FINDINGS: Right Kidney:  Length: 9.5 cm. Echogenicity within normal limits. No mass or hydronephrosis visualized.  Left Kidney:  Length: 10.7 cm. Echogenicity within normal limits. No mass or hydronephrosis visualized.  Bladder:  Appears normal for degree of bladder distention.  IMPRESSION: Normal renal ultrasound.   Electronically Signed   By: Linwood Dibbles.D.  On: 05/12/2015 00:11    Medications:  Scheduled: . amiodarone  200 mg Oral BID  . atorvastatin  40 mg Oral q1800  . insulin aspart  0-9 Units Subcutaneous TID WC  . levothyroxine  25 mcg Intravenous Daily  . metoprolol succinate  50 mg Oral BID  .  morphine injection  4 mg Intravenous Once  . promethazine  12.5 mg Intravenous Once  . rivaroxaban  20 mg Oral Q supper  . sodium chloride  3 mL Intravenous Q12H   Continuous: . sodium chloride 75 mL/hr at 05/12/15 0047  . sodium chloride     VOH:[id number] **OR** acetaminophen, morphine injection, ondansetron (ZOFRAN) IV  Assessment/Plan:  Principal Problem:   Syncope Active Problems:   Diabetes mellitus type 2, noninsulin dependent ([name])   Hypertrophic cardiomyopathy    VT (ventricular tachycardia) ([name])   Atrial fibrillation, unspecified   CAD (coronary artery disease), native coronary artery - 3 vessel   Chronic diastolic CHF (congestive heart failure) ([name])   Hypothyroidism   HLD (hyperlipidemia)   Nausea & vomiting   Abdominal pain    Syncope Patient's history is suggestive of orthostatic hypotension. Orthostatic vital signs are pending. However, she does have significant cardiac history. Recently had an ICD placed for ventricular tachycardia, September 12. No arrhythmias noted as yet. Cardiology is following. ICD to be interrogated. Some element of dehydration also possible secondary to her GI symptoms. Gentle IV hydration was provided.  Nausea, vomiting and abdominal pain No clear etiology. CT scan of the abdomen was unremarkable for any acute process. Lipase was normal. Could be related to medications that she was started on recently, especially her symptoms of nausea, vomiting. Abdominal pain could be due to episodes of emesis. She is noted to have history of diabetes as well. Diabetic gastroparesis is a possibility. However, her symptoms are somewhat acute. Medication side effect is more likely. Continue to observe. Clear liquids for now.  History of triple-vessel coronary artery disease Currently on medical management. Discussions have been held with the patient regarding possibility of bypass surgery in the future. Currently stable. Continue home medications.  History of ventricular tachycardia She has AICD in place. Monitor on telemetry.  History of atrial fibrillation Rate is well controlled. Continue anticoagulation. Continue beta blockers.  History of type 2 diabetes on oral agents HbA1c was 7.5 in September. Monitor CBGs closely. Sliding scale coverage.   Chronic diastolic congestive heart failure Recent echocardiogram from all this showed EF of 55-60%. She was on Lasix at home, which has been held due to clinical dehydration. Continue  to monitor closely.  History of hypothyroidism Continue Synthroid.  History of hyperlipidemia Continue Lipitor  DVT Prophylaxis: On oral anticoagulation    Code Status: Full code  Family Communication: Discussed with the patient  Disposition Plan: Continue current management. Check orthostatics. Await further cardiology input. ICD to be interrogated.    LOS: 1 day   [facility] Pager [phone] 05/12/2015, 8:46 AM  If 7PM-7AM, please contact night-coverage at [email or web address], password [facility]

## 2015-05-12 NOTE — Progress Notes (Signed)
  Echocardiogram 2D Echocardiogram has been performed.  [name] 05/12/2015, 3:25 PM

## 2015-05-13 DIAGNOSIS — E785 Hyperlipidemia, unspecified: Secondary | ICD-10-CM

## 2015-05-13 DIAGNOSIS — I951 Orthostatic hypotension: Secondary | ICD-10-CM | POA: Insufficient documentation

## 2015-05-13 DIAGNOSIS — I422 Other hypertrophic cardiomyopathy: Secondary | ICD-10-CM

## 2015-05-13 LAB — BASIC METABOLIC PANEL
Anion gap: 9 (ref 5–15)
BUN: 26 mg/dL — AB (ref 6–20)
CHLORIDE: 102 mmol/L (ref 101–111)
CO2: 26 mmol/L (ref 22–32)
Calcium: 9.1 mg/dL (ref 8.9–10.3)
Creatinine, Ser: 0.88 mg/dL (ref 0.44–1.00)
GFR calc Af Amer: >60 mL/min (ref >60)
GFR calc non Af Amer: >60 mL/min (ref >60)
GLUCOSE: 133 mg/dL — AB (ref 65–99)
POTASSIUM: 4 mmol/L (ref 3.5–5.1)
Sodium: 137 mmol/L (ref 135–145)

## 2015-05-13 LAB — CBC
HEMATOCRIT: 42 % (ref 36.0–46.0)
HEMOGLOBIN: 13.5 g/dL (ref 12.0–15.0)
MCH: 27.1 pg (ref 26.0–34.0)
MCHC: 32.1 g/dL (ref 30.0–36.0)
MCV: 84.2 fL (ref 78.0–100.0)
Platelets: 263 10*3/uL (ref 150–400)
RBC: 4.99 MIL/uL (ref 3.87–5.11)
RDW: 15.1 % (ref 11.5–15.5)
WBC: 8.1 10*3/uL (ref 4.0–10.5)

## 2015-05-13 LAB — GLUCOSE, CAPILLARY
GLUCOSE-CAPILLARY: 256 mg/dL — AB (ref 65–99)
GLUCOSE-CAPILLARY: 150 mg/dL — AB (ref 65–99)
GLUCOSE-CAPILLARY: 155 mg/dL — AB (ref 65–99)
GLUCOSE-CAPILLARY: 148 mg/dL — AB (ref 65–99)

## 2015-05-13 LAB — UREA NITROGEN, URINE: UREA NITROGEN UR: 748 mg/dL

## 2015-05-13 MED ORDER — LEVOTHYROXINE SODIUM 50 MCG PO TABS
50.0000 ug | ORAL_TABLET | Freq: Every day | ORAL | Status: DC
  Administered 2015-05-14: 50 ug via ORAL
  Filled 2015-05-13: qty 1

## 2015-05-13 NOTE — Progress Notes (Signed)
Occupational Therapy Treatment Patient Details Name: [name] MRN: [id number] DOB: [birth year] Today's Date: 05/13/2015    History of present illness 69 y.o. female with PMH of hyperlipidemia, diabetes mellitus, hypothyroidism, PAF on Xaretlo, TVR, MVR, diastolic congestive heart failure (EF of 55-60%), recently diagnosed 3V CAD + ventricular tachycardia s/p St. Jude ICD 04/2015, who presented with nausea, vomiting, abdominal pain, dizziness and syncope   OT comments  Education provided in session and OT gave pt level 1 theraband. Will plan to see again tomorrow prior to d/c.   Follow Up Recommendations  No OT follow up;Supervision - Intermittent    Equipment Recommendations  None recommended by OT    Recommendations for Other Services      Precautions / Restrictions Precautions Precautions: Fall Precaution Comments: h/o falls Restrictions Weight Bearing Restrictions: No       Mobility Bed Mobility Overal bed mobility: Modified Independent (supine to sit)                Transfers Overall transfer level: Needs assistance   Transfers: Sit to/from Stand Sit to Stand: Supervision         General transfer comment: reached to IV pole with first stand from bed    Balance    History of falls.                                ADL Overall ADL's : Needs assistance/impaired     Grooming: Wash/dry Radiographer, therapeutic: Min guard;Ambulation;Regular Toilet (pt pushed IV pole to bathroom;supervision to ambulate back to bed; supervision for sit to stand transfers)           Functional mobility during ADLs: Min guard (Min guard-supervision level; pushed IV pole some) General ADL Comments: Educated on energy conservation as pt had reported yesterday that she gets fatigued at home-handout given.       Vision                     Perception     Praxis      Cognition   Awake/Alert Behavior During Therapy: WFL for tasks assessed/performed Overall Cognitive Status: Within Functional Limits for tasks assessed                       Extremity/Trunk Assessment               Exercises Other Exercises Other Exercises: Performed level 1 theraband exercises with bilateral UEs (shoulder flexion, elbow flexion/extension, horizontal abduction/adduction, shoulder abduction)   Shoulder Instructions       General Comments      Pertinent Vitals/ Pain       Pain Assessment:  (at beginning of session-no pain; however pt seemed to have pain at IV site later in session during exercise)  Home Living                                          Prior Functioning/Environment              Frequency Min 2X/week     Progress Toward Goals  OT Goals(current goals can now be found in the care plan section)  Progress towards OT goals: Progressing toward goals  Acute Rehab OT Goals Patient Stated Goal: get back to cooking OT Goal Formulation: With patient Time For Goal Achievement: 05/19/15 Potential to Achieve Goals: Good ADL Goals Pt Will Perform Lower Body Dressing: with modified independence;sit to/from stand Pt Will Transfer to Toilet: with modified independence;ambulating;regular height toilet Additional ADL Goal #1: Pt will independently perform HEP for bilateral UEs to increase strength.  Plan Discharge plan remains appropriate    Co-evaluation                 End of Session Equipment Utilized During Treatment: Gait belt;Other (comment) (theraband)   Activity Tolerance Patient tolerated treatment well   Patient Left with call bell/phone within reach (sitting EOB)   Nurse Communication          Time: 4098-1191 OT Time Calculation (min): 18 min  Charges: OT General Charges $OT Visit: 1 Procedure OT Treatments $Therapeutic Exercise: 8-22 mins  Benito Mccreedy OTR/L [phone] 05/13/2015, 12:41  PM

## 2015-05-13 NOTE — Progress Notes (Signed)
Pt continued to refused to take her amiodarone and metoprolol pills. Fredirick Maudlin NP made aware.

## 2015-05-13 NOTE — Progress Notes (Addendum)
Had been in patient's room around 17:45 p.m. Completed the patient's last medication pass of the day while she sat on the side of the bed. Patient got in bed and covered up for the night and requested that her door be closed. Patient got up to go to the sink and fell. NT Didier heard a noise in the room as he walked by and reported the fall. Pt's vital signs were taken and were as follows: temp 97.6; O2 96% on room air; blood pressure 164/91; heart rate 70; CBG 155. Called central telemetry and had strips reviewed from 17:40 to 17:48. Nothing unusual on strips besides a little artifact during that time period. Pt was examined all over. No scrapes, cuts, or abrasions of any kind noted.

## 2015-05-13 NOTE — Progress Notes (Signed)
Patient Name: [name] Date of Encounter: 05/13/2015  Principal Problem:   Syncope Active Problems:   Diabetes mellitus type 2, noninsulin dependent ([place])   Hypertrophic cardiomyopathy   VT (ventricular tachycardia) ([name])   Atrial fibrillation, unspecified   CAD (coronary artery disease), native coronary artery - 3 vessel   Chronic diastolic CHF (congestive heart failure) ([name])   Hypothyroidism   HLD (hyperlipidemia)   Nausea & vomiting   Abdominal pain   ICD (implantable cardioverter-defibrillator) in place   Length of Stay: 2  SUBJECTIVE  The patient states that she feels significantly better.   CURRENT MEDS . amiodarone  200 mg Oral BID  . atorvastatin  40 mg Oral q1800  . insulin aspart  0-9 Units Subcutaneous TID WC  . levothyroxine  25 mcg Intravenous Daily  . metoprolol succinate  25 mg Oral BID  .  morphine injection  4 mg Intravenous Once  . promethazine  12.5 mg Intravenous Once  . rivaroxaban  20 mg Oral Q supper  . sodium chloride  3 mL Intravenous Q12H    OBJECTIVE  Filed Vitals:   05/12/15 1154 05/12/15 2000 05/12/15 2021 05/13/15 0404  BP: 145/72  108/54 98/57  Pulse: 65  72 67  Temp: 97.5 F (36.4 C)  97.9 F (36.6 C) 97.9 F (36.6 C)  TempSrc: Oral  Oral Oral  Resp:  18 19 19   Height:      Weight:    153 lb 9.6 oz (69.673 kg)  SpO2: 98%  97% 95%    Intake/Output Summary (Last 24 hours) at 05/13/15 1121 Last data filed at 05/13/15 0905  Gross per 24 hour  Intake    360 ml  Output      0 ml  Net    360 ml   Filed Weights   05/12/15 0024 05/13/15 0404  Weight: 153 lb 9.6 oz (69.673 kg) 153 lb 9.6 oz (69.673 kg)   PHYSICAL EXAM  General: Ple[place], NAD. Neuro: Alert and oriented X 3. Moves all extremities spontaneously. Psych: Normal affect. HEENT:  Normal  Neck: Supple without bruits or JVD. Lungs:  Resp regular and unlabored, CTA. Heart: RRR no s3, s4, or murmurs. Abdomen: Soft, non-tender, non-distended, BS + x 4.    Extremities: No clubbing, cyanosis or edema. DP/PT/Radials 2+ and equal bilaterally.  Accessory Clinical Findings  CBC  Recent Labs  05/12/15 0154 05/13/15 0621  WBC 10.2 8.1  HGB 14.7 13.5  HCT 45.5 42.0  MCV 84.3 84.2  PLT 270 950   Basic Metabolic Panel  Recent Labs  05/11/15 1930 05/12/15 0154 05/13/15 0621  NA 136 137 137  K 5.0 4.1 4.0  CL 100* 105 102  CO2 27 21* 26  GLUCOSE 254* 201* 133*  BUN 41* 39* 26*  CREATININE 1.25* 1.21* 0.88  CALCIUM 9.5 9.2 9.1  MG 2.4  --   --    Liver Function Tests  Recent Labs  05/12/15 0154  AST 18  ALT 16  ALKPHOS 81  BILITOT 0.5  PROT 6.8  ALBUMIN 3.6    Recent Labs  05/12/15 0154  LIPASE 48   Cardiac Enzymes  Recent Labs  05/12/15 0154 05/12/15 0727 05/12/15 1257  TROPONINI 0.05* 0.06* 0.05*   Ct Head Wo Contrast  05/11/2015   CLINICAL DATA:  Recent syncopal episodes.  EXAM: CT HEAD WITHOUT CONTRAST  TECHNIQUE: Contiguous axial images were obtained from the base of the skull through the vertex without intravenous contrast.  COMPARISON:  None.  FINDINGS: There is no intracranial hemorrhage, mass or evidence of acute infarction. There is mild generalized atrophy. There is mild chronic microvascular ischemic change. There is no significant extra-axial fluid collection.  No acute intracranial findings are evident. No bone abnormality is evident. The visible paranasal sinuses are clear.  IMPRESSION: Mild generalized atrophy and chronic small vessel disease. No acute intracranial findings.   Electronically Signed   By: Andreas Newport M.D.   On: 05/11/2015 23:34   TELE: SR     ASSESSMENT AND PLAN  1. Recurrent daily syncope - after standing up from sitting position - h/o paroxysmal VT - s/p ICD. I have contacted [employer], the patient was not transmitting from home, [employer] interrogated the last night and the report showed no VT and no a-fib.   Electrolytes are all ok.  Telemetry shows SR only.  I will  decrease metoprolol to 25 mg po BID. Telemetry showed only SR in the last 48 hours, no VT.   She is refusing to take any meds other than Synthroid, explained that she needs to take amiodarone and I will decrease metoprolol to 12.5 mg po BID as she is hypotensive and HR is 66.   2. Elevated troponin - minimal with flat trend with CKD stage 3  3. Paroxysmal atrial fibrillation - atrial paced today, in NSR the intere  Time on interrogation. Continue current regimen.  4. Chronic diastolic CHF - appears euvolemic. Lasix PRN from now on.  We are unable to add ACEI as her BP runs low.   5. CAD in native artery - not on ASA due to being on Xarelto. Will have her see MD in 6-8 weeks to revisit issue of possibly needing CABG. No angina. Continue BB. Add statin and check lipids/LFTs at time of f/u appointment.  6. Abnormal TSH - recheck today. Will need to be periodically assessed on amiodarone.  She doesn't want to go home today as she has no transportation. Discharge in the am.   Signed, Dorothy Spark MD, [facility] 05/13/2015

## 2015-05-13 NOTE — Progress Notes (Signed)
[facility] PROGRESS NOTE  MARITSSA HAUGHTON UUV:[id number] DOB: [birth year] DOA: 05/11/2015  PCP: Lujean Amel, MD  Brief HPI: 69 year old Caucasian female with a past medical history of diabetes, hypothyroidism, paroxysmal atrial fibrillation on anticoagulation, triple  vessel coronary artery disease , currently on medical management, history of ventricular tachycardia status post ICD placement, presented with nausea, vomiting, abdominal pain, dizziness and syncope. She was discharged on September 19, after undergoing cardiac assessment as mentioned above.  Past medical history:  Past Medical History  Diagnosis Date  . Diabetes mellitus without complication ([place])     dx on wed  . Paroxysmal atrial fibrillation ([name])   . Hypertrophic cardiomyopathy ([place])   . Left atrial enlargement   . Tricuspid regurgitation     a. Echo 04/2015: Mod-severe TR.  [name] Ventricular tachycardia ([place])     a. s/p [employer] ICD implanted 04/2015.  [name] CAD (coronary artery disease), native coronary artery - 3 vessel 04/21/2015    a. NSTEMI 8-04/2015 felt 2/2 demand ischemia. b. 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  . Abnormal PFT   . Chronic diastolic CHF (congestive heart failure) ([place])   . Mitral regurgitation     a. Echo 04/2015: moderate mitral regurgitation.  . Diabetes mellitus [facility])     Consultants: Cardiology  Procedures: None  Antibiotics: None  Subjective: Patient feels better this morning. Denies any chest pain or shortness of breath. No dizziness or lightheadedness. Tolerating her diet. Requesting solid food.  Objective: Vital Signs  Filed Vitals:   05/12/15 1154 05/12/15 2000 05/12/15 2021 05/13/15 0404  BP: 145/72  108/54 98/57  Pulse: 65  72 67  Temp: 97.5 F (36.4 C)  97.9 F (36.6 C) 97.9 F (36.6 C)  TempSrc: Oral  Oral Oral  Resp:  18 19 19   Height:      Weight:    69.673 kg (153 lb 9.6 oz)  SpO2: 98%  97% 95%     Intake/Output Summary (Last 24 hours) at 05/13/15 0808 Last data filed at 05/12/15 1400  Gross per 24 hour  Intake    243 ml  Output      0 ml  Net    243 ml   Filed Weights   05/12/15 0024 05/13/15 0404  Weight: 69.673 kg (153 lb 9.6 oz) 69.673 kg (153 lb 9.6 oz)    General appearance: alert, cooperative, appears stated age and no distress Resp: clear to auscultation bilaterally Cardio: S1 S2 is normal, regular. No S3, S4. Systolic murmur appreciated the apex. No pedal edema. No JVD. GI: soft, non-tender; bowel sounds normal; no masses,  no organomegaly Extremities: extremities normal, atraumatic, no cyanosis or edema Neurologic: Alert and oriented 3. No focal neurological deficits noted.  Lab Results:  Basic Metabolic Panel:  Recent Labs Lab 05/11/15 1930 05/12/15 0154 05/13/15 0621  NA 136 137 137  K 5.0 4.1 4.0  CL 100* 105 102  CO2 27 21* 26  GLUCOSE 254* 201* 133*  BUN 41* 39* 26*  CREATININE 1.25* 1.21* 0.88  CALCIUM 9.5 9.2 9.1  MG 2.4  --   --    Liver Function Tests:  Recent Labs Lab 05/12/15 0154  AST 18  ALT 16  ALKPHOS 81  BILITOT 0.5  PROT 6.8  ALBUMIN 3.6    Recent Labs Lab 05/12/15 0154  LIPASE 48   CBC:  Recent Labs Lab 05/11/15 1930 05/12/15 0154 05/13/15 0621  WBC 10.9* 10.2 8.1  HGB 14.9 14.7 13.5  HCT 46.1* 45.5 42.0  MCV 84.3 84.3 84.2  PLT 290 270 263   Cardiac Enzymes:  Recent Labs Lab 05/12/15 0154 05/12/15 0727 05/12/15 1257  TROPONINI 0.05* 0.06* 0.05*   BNP (last 3 results)  Recent Labs  03/30/15 2344 04/13/15 1355 05/12/15 0154  BNP 630.5* 740.3* 299.5*   CBG:  Recent Labs Lab 05/11/15 1928 05/12/15 0618 05/12/15 1106 05/12/15 1601 05/12/15 2108  GLUCAP 232* 147* 179* 211* 136*     Studies/Results: Ct Abdomen Pelvis Wo Contrast  05/11/2015   CLINICAL DATA:  Acute onset of nausea, vomiting and generalized abdominal pain. Initial encounter.  EXAM: CT ABDOMEN AND PELVIS WITHOUT  CONTRAST  TECHNIQUE: Multidetector CT imaging of the abdomen and pelvis was performed following the standard protocol without IV contrast.  COMPARISON:  Abdominal radiograph performed 01/08/2013  FINDINGS: The visualized lung bases are clear. Mitral valve calcification is noted. An AICD lead is noted.  The liver and spleen are unremarkable in appearance. The gallbladder is within normal limits. The pancreas and adrenal glands are unremarkable.  Mild nonspecific perinephric stranding is noted bilaterally. The kidneys are otherwise unremarkable. There is no evidence of hydronephrosis. No renal or ureteral stones are identified.  No free fluid is identified. The small bowel is unremarkable in appearance. The stomach is within normal limits. No acute vascular abnormalities are seen. Mild scattered calcification is noted along the abdominal aorta and its branches.  The appendix is normal in caliber, without evidence of appendicitis. Scattered diverticulosis is noted along the ascending, descending and sigmoid colon, without evidence of diverticulitis.  The bladder is moderately distended and grossly unremarkable in appearance. The uterus is unremarkable in appearance. The ovaries are relatively symmetric. No suspicious adnexal masses are seen. No inguinal lymphadenopathy is seen.  No acute osseous abnormalities are identified. Vacuum phenomenon is noted at L5-S1.  IMPRESSION: 1. No acute abnormality seen within the abdomen or pelvis. 2. Mitral valve calcification noted. 3. Mild scattered calcification along the abdominal aorta and its branches. 4. Scattered diverticulosis along the ascending, descending and sigmoid colon, without evidence of diverticulitis.   Electronically Signed   By: Garald Balding M.D.   On: 05/11/2015 23:36   Ct Head Wo Contrast  05/11/2015   CLINICAL DATA:  Recent syncopal episodes.  EXAM: CT HEAD WITHOUT CONTRAST  TECHNIQUE: Contiguous axial images were obtained from the base of the skull  through the vertex without intravenous contrast.  COMPARISON:  None.  FINDINGS: There is no intracranial hemorrhage, mass or evidence of acute infarction. There is mild generalized atrophy. There is mild chronic microvascular ischemic change. There is no significant extra-axial fluid collection.  No acute intracranial findings are evident. No bone abnormality is evident. The visible paranasal sinuses are clear.  IMPRESSION: Mild generalized atrophy and chronic small vessel disease. No acute intracranial findings.   Electronically Signed   By: Andreas Newport M.D.   On: 05/11/2015 23:34   US Renal  05/12/2015   CLINICAL DATA:  Acute renal injury.  EXAM: RENAL / URINARY TRACT ULTRASOUND COMPLETE  COMPARISON:  CT of the abdomen pelvis dated 05/11/2015  FINDINGS: Right Kidney:  Length: 9.5 cm. Echogenicity within normal limits. No mass or hydronephrosis visualized.  Left Kidney:  Length: 10.7 cm. Echogenicity within normal limits. No mass or hydronephrosis visualized.  Bladder:  Appears normal for degree of bladder distention.  IMPRESSION: Normal renal ultrasound.   Electronically Signed   By: Fidela Salisbury M.D.   On: 05/12/2015  00:11    Medications:  Scheduled: . amiodarone  200 mg Oral BID  . atorvastatin  40 mg Oral q1800  . insulin aspart  0-9 Units Subcutaneous TID WC  . levothyroxine  25 mcg Intravenous Daily  . metoprolol succinate  25 mg Oral BID  .  morphine injection  4 mg Intravenous Once  . promethazine  12.5 mg Intravenous Once  . rivaroxaban  20 mg Oral Q supper  . sodium chloride  3 mL Intravenous Q12H   Continuous: . sodium chloride 50 mL/hr at 05/12/15 1329   YFR:[id number] **OR** acetaminophen, metoCLOPramide (REGLAN) injection, morphine injection, ondansetron (ZOFRAN) IV  Assessment/Plan:  Principal Problem:   Syncope Active Problems:   Diabetes mellitus type 2, noninsulin dependent ([name])   Hypertrophic cardiomyopathy   VT (ventricular tachycardia) ([name])    Atrial fibrillation, unspecified   CAD (coronary artery disease), native coronary artery - 3 vessel   Chronic diastolic CHF (congestive heart failure) ([name])   Hypothyroidism   HLD (hyperlipidemia)   Nausea & vomiting   Abdominal pain   ICD (implantable cardioverter-defibrillator) in place    Syncope/Orthostatic Hypotension Patient's history is suggestive of orthostatic hypotension. Orthostatic vital signs confirmed this. Could be secondary to her GI illness. Could also be secondary to medications. She does have significant cardiac history. Recently had an ICD placed for ventricular tachycardia, September 12. No arrhythmias noted as yet. Cardiology is following. ICD was interrogated. No ventricular tachycardia was noted. Cardiology is adjusting her medications.   Nausea, vomiting and abdominal pain No clear etiology. CT scan of the abdomen was unremarkable for any acute process. Lipase was normal. Could be related to medications that she was started on recently, especially her symptoms of nausea, vomiting. Abdominal pain could be due to episodes of emesis. She is noted to have history of diabetes as well. Diabetic gastroparesis is a possibility. However, her symptoms are somewhat acute. Medication side effect is more likely. Patient is improved this morning. Advance to solid food.   History of triple-vessel coronary artery disease Currently on medical management. Discussions have been held with the patient regarding possibility of bypass surgery in the future. Currently stable. Cardiology is managing.  History of ventricular tachycardia She has AICD in place. Monitor on telemetry.  History of atrial fibrillation Rate is well controlled. Continue anticoagulation, Xarelto. Continue beta blockers.  History of type 2 diabetes on oral agents HbA1c was 7.5 in September. Monitor CBGs closely. Sliding scale coverage.   Chronic diastolic congestive heart failure Recent echocardiogram from all  this showed EF of 55-60%. She was on Lasix at home, which has been held due to clinical dehydration. Continue to monitor closely. Only as needed Lasix from here onwards per cardiology.  History of hypothyroidism Continue Synthroid.  History of hyperlipidemia Continue Lipitor  DVT Prophylaxis: On oral anticoagulation    Code Status: Full code  Family Communication: Discussed with the patient  Disposition Plan: Continue current management. Discharge when cleared by cardiology..    LOS: 2 days   [facility] Pager [phone] 05/13/2015, 8:08 AM  If 7PM-7AM, please contact night-coverage at [email or web address], password [facility]

## 2015-05-13 NOTE — Progress Notes (Signed)
Utilization Review Completed.[name] T10/04/2015  

## 2015-05-13 NOTE — Progress Notes (Signed)
Patient Name: [name] Date of Encounter: 05/13/2015  Principal Problem:   Syncope Active Problems:   Diabetes mellitus type 2, noninsulin dependent ([place])   Hypertrophic cardiomyopathy   VT (ventricular tachycardia) ([name])   Atrial fibrillation, unspecified   CAD (coronary artery disease), native coronary artery - 3 vessel   Chronic diastolic CHF (congestive heart failure) ([name])   Hypothyroidism   HLD (hyperlipidemia)   Nausea & vomiting   Abdominal pain   ICD (implantable cardioverter-defibrillator) in place    Primary Cardiologist: Dr. Mare Ferrari Patient Profile: 69 yo F w/ PMH significant for apical variant HCM, 3vCAD, paroxysmal afib, and VT s/p ICD for secondary prevention admitted on 05/11/2015 with syncope X 1 week.  SUBJECTIVE: Still refusing to take Metoprolol and Amiodarone because "they make her feel bad". Denies any recurrence in dizziness, lightheadedness, or syncopal events. Reports her nausea has improved.  OBJECTIVE Filed Vitals:   05/12/15 1154 05/12/15 2000 05/12/15 2021 05/13/15 0404  BP: 145/72  108/54 98/57  Pulse: 65  72 67  Temp: 97.5 F (36.4 C)  97.9 F (36.6 C) 97.9 F (36.6 C)  TempSrc: Oral  Oral Oral  Resp:  18 19 19   Height:      Weight:    153 lb 9.6 oz (69.673 kg)  SpO2: 98%  97% 95%    Intake/Output Summary (Last 24 hours) at 05/13/15 1025 Last data filed at 05/13/15 0905  Gross per 24 hour  Intake    363 ml  Output      0 ml  Net    363 ml   Filed Weights   05/12/15 0024 05/13/15 0404  Weight: 153 lb 9.6 oz (69.673 kg) 153 lb 9.6 oz (69.673 kg)    PHYSICAL EXAM General: Well developed, well nourished, female in no acute distress. Head: Normocephalic, atraumatic.  Neck: Supple without bruits, JVD not elevated. Lungs:  Resp regular and unlabored, CTA without wheezing or rales. Heart: RRR, S1, S2, no S3, S4, or murmur; no rub. Abdomen: Soft, non-tender, non-distended with normoactive bowel sounds. No hepatomegaly. No  rebound/guarding. No obvious abdominal masses. Extremities: No clubbing, cyanosis, or edema. Distal pedal pulses are 2+ bilaterally. Neuro: Alert and oriented X 3. Moves all extremities spontaneously. Psych: Normal affect.  LABS: CBC: Recent Labs  05/12/15 0154 05/13/15 0621  WBC 10.2 8.1  HGB 14.7 13.5  HCT 45.5 42.0  MCV 84.3 84.2  PLT 270 263   INR: Recent Labs  05/12/15 0154  INR 5.03   Basic Metabolic Panel: Recent Labs  05/11/15 1930 05/12/15 0154 05/13/15 0621  NA 136 137 137  K 5.0 4.1 4.0  CL 100* 105 102  CO2 27 21* 26  GLUCOSE 254* 201* 133*  BUN 41* 39* 26*  CREATININE 1.25* 1.21* 0.88  CALCIUM 9.5 9.2 9.1  MG 2.4  --   --    Liver Function Tests: Recent Labs  05/12/15 0154  AST 18  ALT 16  ALKPHOS 81  BILITOT 0.5  PROT 6.8  ALBUMIN 3.6   Cardiac Enzymes: Recent Labs  05/12/15 0154 05/12/15 0727 05/12/15 1257  TROPONINI 0.05* 0.06* 0.05*    Recent Labs  05/11/15 1944  TROPIPOC 0.07   BNP:  B NATRIURETIC PEPTIDE  Date/Time Value Ref Range Status  05/12/2015 01:54 AM 299.5* 0.0 - 100.0 pg/mL Final  04/13/2015 01:55 PM 740.3* 0.0 - 100.0 pg/mL Final   TELE: A-paced NSR 60-70's. No atopic events.        ECHO:  05/12/2015 Study Conclusions - Left ventricle: The cavity size was normal. Systolic function was normal. The estimated ejection fraction was in the range of 60% to 65%. Asymmetic septal hypertrophy consistent with hypertrophic cardiomyopathy. There is no SAM or dynamic obstructive gradient at rest. Wall motion was normal; there were no regional wall motion abnormalities. Doppler parameters are consistent with abnormal left ventricular relaxation (grade 1 diastolic dysfunction). - Aortic valve: There was mild regurgitation. Valve area (VTI): 2.48 cm^2. Valve area (Vmax): 2.7 cm^2. - Mitral valve: Mildly to moderately calcified annulus. Mildly thickened leaflets . There was mild regurgitation. - Left  atrium: The atrium was severely dilated. - Technically adequate study.  Radiology/Studies: Ct Abdomen Pelvis Wo Contrast: 05/11/2015   CLINICAL DATA:  Acute onset of nausea, vomiting and generalized abdominal pain. Initial encounter.  EXAM: CT ABDOMEN AND PELVIS WITHOUT CONTRAST  TECHNIQUE: Multidetector CT imaging of the abdomen and pelvis was performed following the standard protocol without IV contrast.  COMPARISON:  Abdominal radiograph performed 01/08/2013  FINDINGS: The visualized lung bases are clear. Mitral valve calcification is noted. An AICD lead is noted.  The liver and spleen are unremarkable in appearance. The gallbladder is within normal limits. The pancreas and adrenal glands are unremarkable.  Mild nonspecific perinephric stranding is noted bilaterally. The kidneys are otherwise unremarkable. There is no evidence of hydronephrosis. No renal or ureteral stones are identified.  No free fluid is identified. The small bowel is unremarkable in appearance. The stomach is within normal limits. No acute vascular abnormalities are seen. Mild scattered calcification is noted along the abdominal aorta and its branches.  The appendix is normal in caliber, without evidence of appendicitis. Scattered diverticulosis is noted along the ascending, descending and sigmoid colon, without evidence of diverticulitis.  The bladder is moderately distended and grossly unremarkable in appearance. The uterus is unremarkable in appearance. The ovaries are relatively symmetric. No suspicious adnexal masses are seen. No inguinal lymphadenopathy is seen.  No acute osseous abnormalities are identified. Vacuum phenomenon is noted at L5-S1.  IMPRESSION: 1. No acute abnormality seen within the abdomen or pelvis. 2. Mitral valve calcification noted. 3. Mild scattered calcification along the abdominal aorta and its branches. 4. Scattered diverticulosis along the ascending, descending and sigmoid colon, without evidence of  diverticulitis.   Electronically Signed   By: Garald Balding M.D.   On: 05/11/2015 23:36   Ct Head Wo Contrast: 05/11/2015   CLINICAL DATA:  Recent syncopal episodes.  EXAM: CT HEAD WITHOUT CONTRAST  TECHNIQUE: Contiguous axial images were obtained from the base of the skull through the vertex without intravenous contrast.  COMPARISON:  None.  FINDINGS: There is no intracranial hemorrhage, mass or evidence of acute infarction. There is mild generalized atrophy. There is mild chronic microvascular ischemic change. There is no significant extra-axial fluid collection.  No acute intracranial findings are evident. No bone abnormality is evident. The visible paranasal sinuses are clear.  IMPRESSION: Mild generalized atrophy and chronic small vessel disease. No acute intracranial findings.   Electronically Signed   By: Andreas Newport M.D.   On: 05/11/2015 23:34   US Renal: 05/12/2015   CLINICAL DATA:  Acute renal injury.  EXAM: RENAL / URINARY TRACT ULTRASOUND COMPLETE  COMPARISON:  CT of the abdomen pelvis dated 05/11/2015  FINDINGS: Right Kidney:  Length: 9.5 cm. Echogenicity within normal limits. No mass or hydronephrosis visualized.  Left Kidney:  Length: 10.7 cm. Echogenicity within normal limits. No mass or hydronephrosis visualized.  Bladder:  Appears normal for  degree of bladder distention.  IMPRESSION: Normal renal ultrasound.   Electronically Signed   By: Fidela Salisbury M.D.   On: 05/12/2015 00:11     Current Medications:  . amiodarone  200 mg Oral BID  . atorvastatin  40 mg Oral q1800  . insulin aspart  0-9 Units Subcutaneous TID WC  . levothyroxine  25 mcg Intravenous Daily  . metoprolol succinate  25 mg Oral BID  .  morphine injection  4 mg Intravenous Once  . promethazine  12.5 mg Intravenous Once  . rivaroxaban  20 mg Oral Q supper  . sodium chloride  3 mL Intravenous Q12H   . sodium chloride 50 mL/hr at 05/12/15 1329    ASSESSMENT AND PLAN:  1. Recurrent daily syncope  -  after standing up from sitting position - h/o paroxysmal VT - s/p ICD. -  [employer] interrogated the device yesterday which showed no evidence of VT. Report reviewed in shadow chart. Metoprolol decreased to 25mg  BID on 05/12/2015.  2. Elevated troponin  - minimal with flat trend (peak at 0.06) with CKD stage 3  3. Paroxysmal atrial fibrillation  - atrial paced today, appears to be in NSR. Interrogation did show occasional episode of atrial fib (last on 9/17 - for 1 day 18 hours) - 21% afib since 04/17/15. Symptomatically improved.  - Is refusing to take Metoprolol and Amiodarone due to "how bad they make her feel". It was explained in detail to the patient her Metoprolol dosage had been decreased, however she still insists on not taking the smaller dose or resuming Amiodarone. She agrees to check her BP at home over the next few weeks and then give further consideration into restarting the medications.  4. Chronic diastolic CHF  - appears euvolemic.  - BP has been 98/54 - 145/72 in the past 24 hours.  - Has been asked to follow her BP at home 1-2x/day over the next week and call us with result. Based on her results would consider initiation of ACEI. If BP remain stable at home will also plan to rx SL NTG PRN as this is not currently on her med list.  5. CAD in native artery  - not on ASA due to being on Xarelto.  - Will have her see MD in 6-8 weeks to revisit issue of possibly needing CABG. No angina. Continue BB.  - Statin added. Will need to check lipids/LFTs at time of f/u appointment.  6. Abnormal TSH  -  Elevated to 10.10 2 weeks ago. Will recheck in AM. - Will need to be periodically assessed on amiodarone.    Arna Medici , PA-C 10:25 AM 05/13/2015 Pager: [phone]  The patient was seen, examined and discussed with Brittainy M. Rosita Fire, PA-C and I agree with the above.   Please see separate note written by me  Dorothy Spark 05/13/2015

## 2015-05-14 ENCOUNTER — Telehealth: Payer: Self-pay

## 2015-05-14 DIAGNOSIS — I951 Orthostatic hypotension: Principal | ICD-10-CM

## 2015-05-14 DIAGNOSIS — Z95810 Presence of automatic (implantable) cardiac defibrillator: Secondary | ICD-10-CM

## 2015-05-14 DIAGNOSIS — I2510 Atherosclerotic heart disease of native coronary artery without angina pectoris: Secondary | ICD-10-CM

## 2015-05-14 DIAGNOSIS — I5032 Chronic diastolic (congestive) heart failure: Secondary | ICD-10-CM

## 2015-05-14 LAB — GLUCOSE, CAPILLARY
GLUCOSE-CAPILLARY: 130 mg/dL — AB (ref 65–99)
GLUCOSE-CAPILLARY: 171 mg/dL — AB (ref 65–99)
GLUCOSE-CAPILLARY: 201 mg/dL — AB (ref 65–99)

## 2015-05-14 LAB — TSH: TSH: 2.235 u[IU]/mL (ref 0.350–4.500)

## 2015-05-14 MED ORDER — RIVAROXABAN 20 MG PO TABS: 20.0000 mg | ORAL_TABLET | Freq: Every day | ORAL | Status: DC

## 2015-05-14 MED ORDER — METOPROLOL SUCCINATE ER 25 MG PO TB24: 12.5000 mg | ORAL_TABLET | Freq: Two times a day (BID) | ORAL | Status: DC

## 2015-05-14 MED ORDER — METOPROLOL TARTRATE 12.5 MG HALF TABLET: 12.5000 mg | ORAL_TABLET | Freq: Two times a day (BID) | ORAL | Status: DC

## 2015-05-14 MED ORDER — METOPROLOL TARTRATE 25 MG PO TABS: 12.5000 mg | ORAL_TABLET | Freq: Two times a day (BID) | ORAL | Status: DC

## 2015-05-14 MED ORDER — FUROSEMIDE 40 MG PO TABS: ORAL_TABLET | ORAL | Status: DC

## 2015-05-14 NOTE — Progress Notes (Signed)
Noted physical therapist recommendation for [place] services at discharge. CM talked to patient about DCP; patient does not want any HHC at this time. CM informed patient that if she changed her mind and wanted it after discharge to call her PCP for arrangements. Mindi Slicker [facility] [phone]

## 2015-05-14 NOTE — Discharge Summary (Signed)
[facility]  Physician Discharge Summary   Patient ID: [name] MRN: [id number] DOB/AGE: March 20, 1946 69 y.o.  Admit date: 05/11/2015 Discharge date: 05/14/2015  PCP: Lujean Amel, MD  DISCHARGE DIAGNOSES:  Principal Problem:   Syncope Active Problems:   Diabetes mellitus type 2, noninsulin dependent ([name])   Hypertrophic cardiomyopathy   VT (ventricular tachycardia) ([name])   Atrial fibrillation, unspecified   CAD (coronary artery disease), native coronary artery - 3 vessel   Chronic diastolic CHF (congestive heart failure) ([name])   Hypothyroidism   HLD (hyperlipidemia)   Nausea & vomiting   Abdominal pain   ICD (implantable cardioverter-defibrillator) in place   Orthostatic hypotension   RECOMMENDATIONS FOR OUTPATIENT FOLLOW UP: 1. Patient to discuss with her cardiologist regarding her medications as an outpatient   DISCHARGE CONDITION: fair  Diet recommendation: Heart healthy. Moderate carbohydrate  Filed Weights   05/12/15 0024 05/13/15 0404 05/14/15 0444  Weight: 69.673 kg (153 lb 9.6 oz) 69.673 kg (153 lb 9.6 oz) 70.716 kg (155 lb 14.4 oz)    INITIAL HISTORY: 69 year old Caucasian female with a past medical history of diabetes, hypothyroidism, paroxysmal atrial fibrillation on anticoagulation, triple vessel coronary artery disease , currently on medical management, history of ventricular tachycardia status post ICD placement, presented with nausea, vomiting, abdominal pain, dizziness and syncope. She was discharged on September 19, after undergoing cardiac assessment as mentioned above.  Consultations:  Cardiology   HOSPITAL COURSE:   Syncope/Orthostatic Hypotension Patient's history is suggestive of orthostatic hypotension. Orthostatic vital signs confirmed this. This could have occurred secondary to her GI illness. Could also be secondary to medication effect. She does have significant cardiac history. Recently had an ICD placed for  ventricular tachycardia, September 12. No arrhythmias noted as yet. Cardiology was consulted. Her ICD was interrogated. No ventricular tachycardia was noted. Dose of her beta blocker was reduced. A shin, however, continues to refuse to take her medications. Seen by cardiology today. She will discuss this further with her outpatient providers. She understands the risks of noncompliance with medications.  Nausea, vomiting and abdominal pain No clear etiology. CT scan of the abdomen was unremarkable for any acute process. Lipase was normal. Could be related to medications that she was started on recently, especially her symptoms of nausea, vomiting. Abdominal pain could be due to episodes of emesis. She is noted to have history of diabetes as well. Diabetic gastroparesis is a possibility. However, her symptoms are somewhat acute. Medication side effect is more likely. Patient gradually improved. Tolerating a solid diet. Feels much better.  History of triple-vessel coronary artery disease Currently on medical management. Discussions have been held with the patient regarding possibility of bypass surgery in the future. Currently stable.   History of ventricular tachycardia She has AICD in place. Monitor on telemetry. Continue amiodarone and beta blocker.  History of atrial fibrillation Rate is well controlled. Continue anticoagulation, Xarelto. Continue beta blockers.  History of type 2 diabetes on oral agents HbA1c was 7.5 in September. Continue home medications.  Chronic diastolic congestive heart failure Recent echocardiogram showed EF of 55-60%. She was on Lasix at home, which has been held due to clinical dehydration. Continue to monitor closely. Only as needed Lasix from here onwards per cardiology.  History of hypothyroidism Continue Synthroid.  History of hyperlipidemia Continue Lipitor  She had a mechanical fall yesterday. She has long-standing chronic issues with her right knee which  occasionally tends to give out. She does not have any focal neurological deficits.  Patient is very  keen on going home. Overall stable. Vital signs are stable today. She has been asked to get up slowly from a sitting or lying position to avoid drop in blood pressure. Seen by cardiology. Okay for discharge.   PERTINENT LABS:  The results of significant diagnostics from this hospitalization (including imaging, microbiology, ancillary and laboratory) are listed below for reference.      Labs: Basic Metabolic Panel:  Recent Labs Lab 05/11/15 1930 05/12/15 0154 05/13/15 0621  NA 136 137 137  K 5.0 4.1 4.0  CL 100* 105 102  CO2 27 21* 26  GLUCOSE 254* 201* 133*  BUN 41* 39* 26*  CREATININE 1.25* 1.21* 0.88  CALCIUM 9.5 9.2 9.1  MG 2.4  --   --    Liver Function Tests:  Recent Labs Lab 05/12/15 0154  AST 18  ALT 16  ALKPHOS 81  BILITOT 0.5  PROT 6.8  ALBUMIN 3.6    Recent Labs Lab 05/12/15 0154  LIPASE 48   CBC:  Recent Labs Lab 05/11/15 1930 05/12/15 0154 05/13/15 0621  WBC 10.9* 10.2 8.1  HGB 14.9 14.7 13.5  HCT 46.1* 45.5 42.0  MCV 84.3 84.3 84.2  PLT 290 270 263   Cardiac Enzymes:  Recent Labs Lab 05/12/15 0154 05/12/15 0727 05/12/15 1257  TROPONINI 0.05* 0.06* 0.05*   BNP: BNP (last 3 results)  Recent Labs  03/30/15 2344 04/13/15 1355 05/12/15 0154  BNP 630.5* 740.3* 299.5*    CBG:  Recent Labs Lab 05/13/15 1555 05/13/15 1752 05/13/15 2220 05/14/15 0602 05/14/15 1132  GLUCAP 150* 155* 148* 171* 201*     IMAGING STUDIES Ct Abdomen Pelvis Wo Contrast  05/11/2015   CLINICAL DATA:  Acute onset of nausea, vomiting and generalized abdominal pain. Initial encounter.  EXAM: CT ABDOMEN AND PELVIS WITHOUT CONTRAST  TECHNIQUE: Multidetector CT imaging of the abdomen and pelvis was performed following the standard protocol without IV contrast.  COMPARISON:  Abdominal radiograph performed 01/08/2013  FINDINGS: The visualized lung bases  are clear. Mitral valve calcification is noted. An AICD lead is noted.  The liver and spleen are unremarkable in appearance. The gallbladder is within normal limits. The pancreas and adrenal glands are unremarkable.  Mild nonspecific perinephric stranding is noted bilaterally. The kidneys are otherwise unremarkable. There is no evidence of hydronephrosis. No renal or ureteral stones are identified.  No free fluid is identified. The small bowel is unremarkable in appearance. The stomach is within normal limits. No acute vascular abnormalities are seen. Mild scattered calcification is noted along the abdominal aorta and its branches.  The appendix is normal in caliber, without evidence of appendicitis. Scattered diverticulosis is noted along the ascending, descending and sigmoid colon, without evidence of diverticulitis.  The bladder is moderately distended and grossly unremarkable in appearance. The uterus is unremarkable in appearance. The ovaries are relatively symmetric. No suspicious adnexal masses are seen. No inguinal lymphadenopathy is seen.  No acute osseous abnormalities are identified. Vacuum phenomenon is noted at L5-S1.  IMPRESSION: 1. No acute abnormality seen within the abdomen or pelvis. 2. Mitral valve calcification noted. 3. Mild scattered calcification along the abdominal aorta and its branches. 4. Scattered diverticulosis along the ascending, descending and sigmoid colon, without evidence of diverticulitis.   Electronically Signed   By: Garald Balding M.D.   On: 05/11/2015 23:36   Ct Head Wo Contrast  05/11/2015   CLINICAL DATA:  Recent syncopal episodes.  EXAM: CT HEAD WITHOUT CONTRAST  TECHNIQUE: Contiguous axial images were obtained from  the base of the skull through the vertex without intravenous contrast.  COMPARISON:  None.  FINDINGS: There is no intracranial hemorrhage, mass or evidence of acute infarction. There is mild generalized atrophy. There is mild chronic microvascular ischemic  change. There is no significant extra-axial fluid collection.  No acute intracranial findings are evident. No bone abnormality is evident. The visible paranasal sinuses are clear.  IMPRESSION: Mild generalized atrophy and chronic small vessel disease. No acute intracranial findings.   Electronically Signed   By: Andreas Newport M.D.   On: 05/11/2015 23:34   US Renal  05/12/2015   CLINICAL DATA:  Acute renal injury.  EXAM: RENAL / URINARY TRACT ULTRASOUND COMPLETE  COMPARISON:  CT of the abdomen pelvis dated 05/11/2015  FINDINGS: Right Kidney:  Length: 9.5 cm. Echogenicity within normal limits. No mass or hydronephrosis visualized.  Left Kidney:  Length: 10.7 cm. Echogenicity within normal limits. No mass or hydronephrosis visualized.  Bladder:  Appears normal for degree of bladder distention.  IMPRESSION: Normal renal ultrasound.   Electronically Signed   By: Fidela Salisbury M.D.   On: 05/12/2015 00:11    DISCHARGE EXAMINATION: Filed Vitals:   05/13/15 2002 05/14/15 0026 05/14/15 0444 05/14/15 1258  BP: 141/86 138/56 133/71 116/71  Pulse: 80 20 73 75  Temp: 97.8 F (36.6 C) 97.7 F (36.5 C) 98.3 F (36.8 C) 97.8 F (36.6 C)  TempSrc: Oral Oral Oral Oral  Resp: 18 20 18 18   Height:      Weight:   70.716 kg (155 lb 14.4 oz)   SpO2: 98% 97% 97% 98%   General appearance: alert, cooperative, appears stated age and no distress Resp: clear to auscultation bilaterally Cardio: regular rate and rhythm, S1, S2 normal, no murmur, click, rub or gallop GI: soft, non-tender; bowel sounds normal; no masses,  no organomegaly Extremities: extremities normal, atraumatic, no cyanosis or edema Neurologic: Alert and oriented X 3, normal strength and tone. Normal symmetric reflexes. Normal coordination and gait  DISPOSITION: Home  Discharge Instructions    Call MD for:  difficulty breathing, headache or visual disturbances    Complete by:  As directed      Call MD for:  extreme fatigue    Complete  by:  As directed      Call MD for:  persistant dizziness or light-headedness    Complete by:  As directed      Call MD for:  persistant nausea and vomiting    Complete by:  As directed      Call MD for:  severe uncontrolled pain    Complete by:  As directed      Call MD for:  temperature >100.4    Complete by:  As directed      Diet - low sodium heart healthy    Complete by:  As directed      Diet Carb Modified    Complete by:  As directed      Discharge instructions    Complete by:  As directed   Please follow-up with your cardiologist to discuss further management of your heart problems. Please take your medications as prescribed. Please get up slowly from a sitting or lying position as discussed. This will prevent blood pressure from dropping suddenly and further episodes of dizziness and passing out.  You were cared for by a hospitalist during your hospital stay. If you have any questions about your discharge medications or the care you received while you were in the hospital  after you are discharged, you can call the unit and asked to speak with the hospitalist on call if the hospitalist that took care of you is not available. Once you are discharged, your primary care physician will handle any further medical issues. Please note that NO REFILLS for any discharge medications will be authorized once you are discharged, as it is imperative that you return to your primary care physician (or establish a relationship with a primary care physician if you do not have one) for your aftercare needs so that they can reassess your need for medications and monitor your lab values. If you do not have a primary care physician, you can call [phone] for a physician referral.     Increase activity slowly    Complete by:  As directed            ALLERGIES: No Known Allergies    Current Discharge Medication List    START taking these medications   Details  metoprolol tartrate (LOPRESSOR) 25 MG  tablet Take 0.5 tablets (12.5 mg total) by mouth 2 (two) times daily. Qty: 60 tablet, Refills: 0      CONTINUE these medications which have CHANGED   Details  furosemide (LASIX) 40 MG tablet Take as needed for leg swelling or if you gain 2 lbs Qty: 30 tablet, Refills: 1    rivaroxaban (XARELTO) 20 MG TABS tablet Take 1 tablet (20 mg total) by mouth daily. Qty: 30 tablet, Refills: 3      CONTINUE these medications which have NOT CHANGED   Details  amiodarone (PACERONE) 200 MG tablet Take 1 tablet (200 mg total) by mouth 2 (two) times daily. Qty: 60 tablet, Refills: 2    atorvastatin (LIPITOR) 40 MG tablet Take 1 tablet (40 mg total) by mouth daily at 6 PM. Qty: 30 tablet, Refills: 3    metFORMIN (GLUCOPHAGE) 500 MG tablet Take 1 tablet (500 mg total) by mouth 2 (two) times daily with a meal. Qty: 60 tablet, Refills: 3    Blood Glucose Monitoring Suppl (TRUE METRIX METER) DEVI 1 each by Does not apply route 2 (two) times daily. Qty: 1 Device, Refills: 0   Associated Diagnoses: Type 2 diabetes mellitus without complication ([name])    glucose blood (TRUE METRIX BLOOD GLUCOSE TEST) test strip Use as instructed twice daily Qty: 100 each, Refills: 12   Associated Diagnoses: Type 2 diabetes mellitus without complication ([name])    levothyroxine (SYNTHROID, LEVOTHROID) 50 MCG tablet Take 1 tablet (50 mcg total) by mouth daily. Qty: 30 tablet, Refills: 3   Associated Diagnoses: Hypothyroidism due to medication    TRUEPLUS LANCETS 28G MISC Use as instructed twice daily. Qty: 60 each, Refills: 5   Associated Diagnoses: Type 2 diabetes mellitus without complication ([name])      STOP taking these medications     metoprolol succinate (TOPROL-XL) 50 MG 24 hr tablet      potassium chloride (K-DUR,KLOR-CON) 10 MEQ tablet        Follow-up Information    Follow up with [facility] R, NP On 05/30/2015.   Specialties:  Cardiology, Radiology   Why:  11:00 AM   Contact information:   [place] [address] [phone]       Follow up with Cristopher Peru, MD On 07/17/2015.   Specialty:  Cardiology   Why:  10:00AM   Contact information:   [address] [place] [address] [phone]  TOTAL DISCHARGE TIME: 35 minutes  [facility] Pager [phone]  05/14/2015, 2:45 PM

## 2015-05-14 NOTE — Progress Notes (Signed)
Discharge instructions completed with patient.  Iv discontinued. Medication regimen reviewed. Pt verbalizes understanding.

## 2015-05-14 NOTE — Progress Notes (Signed)
Subjective: No CP or SOB.  A little dizzy this morning when just sitting .  Meds are making her nauseated.   Objective: Vital signs in last 24 hours: Temp:  [97.6 F (36.4 C)-98.3 F (36.8 C)] 98.3 F (36.8 C) (10/10 0444) Pulse Rate:  [20-80] 73 (10/10 0444) Resp:  [18-20] 18 (10/10 0444) BP: (87-164)/(47-91) 133/71 mmHg (10/10 0444) SpO2:  [96 %-100 %] 97 % (10/10 0444) Weight:  [155 lb 14.4 oz (70.716 kg)] 155 lb 14.4 oz (70.716 kg) (10/10 0444) Last BM Date: 05/09/15  Intake/Output from previous day: 10/09 0701 - 10/10 0700 In: 920 [P.O.:920] Out: 1200 [Urine:1200] Intake/Output this shift: Total I/O In: 240 [P.O.:240] Out: -   Medications Scheduled Meds: . amiodarone  200 mg Oral BID  . atorvastatin  40 mg Oral q1800  . insulin aspart  0-9 Units Subcutaneous TID WC  . levothyroxine  50 mcg Oral QAC breakfast  . metoprolol succinate  25 mg Oral BID  .  morphine injection  4 mg Intravenous Once  . promethazine  12.5 mg Intravenous Once  . rivaroxaban  20 mg Oral Q supper  . sodium chloride  3 mL Intravenous Q12H   Continuous Infusions:  PRN Meds:.acetaminophen **OR** acetaminophen, metoCLOPramide (REGLAN) injection, morphine injection, ondansetron (ZOFRAN) IV  PE: General appearance: alert, cooperative and no distress Lungs: clear to auscultation bilaterally Heart: regular rate and rhythm and 1/6 sys MM Extremities: No LEE' Pulses: 2+ and symmetric Skin: Warm and dry Neurologic: Grossly normal  Lab Results:   Recent Labs  05/11/15 1930 05/12/15 0154 05/13/15 0621  WBC 10.9* 10.2 8.1  HGB 14.9 14.7 13.5  HCT 46.1* 45.5 42.0  PLT 290 270 263   BMET  Recent Labs  05/11/15 1930 05/12/15 0154 05/13/15 0621  NA 136 137 137  K 5.0 4.1 4.0  CL 100* 105 102  CO2 27 21* 26  GLUCOSE 254* 201* 133*  BUN 41* 39* 26*  CREATININE 1.25* 1.21* 0.88  CALCIUM 9.5 9.2 9.1   PT/INR  Recent Labs  05/12/15 0154  LABPROT 16.1*  INR 1.28    Diagnostic Diagram          Assessment/Plan    Principal Problem:   Syncope Active Problems:   Diabetes mellitus type 2, noninsulin dependent ([name])   Hypertrophic cardiomyopathy   VT (ventricular tachycardia) ([name])   Atrial fibrillation, unspecified   CAD (coronary artery disease), native coronary artery - 3 vessel   Chronic diastolic CHF (congestive heart failure) ([name])   Hypothyroidism   HLD (hyperlipidemia)   Nausea & vomiting   Abdominal pain   ICD (implantable cardioverter-defibrillator) in place   Orthostatic hypotension  1. Recurrent daily syncope -  after standing up from sitting position - h/o paroxysmal VT - s/p ICD.  St Jude interrogated and the report showed no VT and no a-fib.  Electrolytes are all ok.  Telemetry shows SR only.   metoprolol to decreased to 12.5 mg po BID. Telemetry showed only SR in the last 48 hours, no VT.   She is still refusing to take any meds other than Synthroid,  2. Elevated troponin - minimal with flat trend with CKD stage 3  3. Paroxysmal atrial fibrillation -  NSR .  Continue current regimen although she will not take the Amio or metoprolol. .  4. Chronic diastolic CHF - appears euvolemic. Lasix PRN from now on. We are unable to add ACEI as her BP runs low.   5. CAD in  native artery - not on ASA due to being on Xarelto. Will have her see MD in 6-8 weeks to revisit issue of possibly needing CABG. No angina. Continue BB. On statin and check lipids/LFTs at time of f/u appointment.  6. Abnormal TSH - recheck today. Will need to be periodically assessed on amiodarone.   LOS: 3 days    HAGER, BRYAN PA-C 05/14/2015 10:43 AM   Agree with note written by [facility]  OK for DC from cardiac standpoint. F/U with Dr. Mare Ferrari as OP. Timing of surgical revasc still not determiined.  Quay Burow 05/14/2015 2:05 PM

## 2015-05-14 NOTE — Discharge Instructions (Signed)

## 2015-05-14 NOTE — Telephone Encounter (Signed)
Met with patient prior to her discharge.   She had been seen in the [facility] ([facility]) at the [facility]  prior to her hospitalization and noted that she would like to continue to follow up at the [facility].  An appointment was scheduled for her on 05/21/15 @ 1115 with Dr Jarold Song.  This information was provided to the patient. She noted that she will continue to have her medication filled at [place] as she has no prescription drug benefit and will need assistance obtaining all medications,especially the xarelto. Discussed the open enrollment for medicare programs and need for a prescription drug benefit.  Provided her with the phone # for [facility] # [phone] and for the [facility]) # [phone].    She was very appreciative of the information.   She stated that she has a glucometer at home and will bring it to her appointment on 05/21/15 because she still has questions about its use. Also encouraged her to bring all of her medications to her appointment for review and she verbalized understanding.

## 2015-05-14 NOTE — Care Management Important Message (Signed)
Important Message  Patient Details  Name: [name] MRN: [id number] Date of Birth: 16-Mar-1946   Medicare Important Message Given:  Yes-second notification given    Delorse Lek 05/14/2015, 2:00 PM

## 2015-05-14 NOTE — Progress Notes (Signed)
Occupational Therapy Treatment Patient Details Name: [name] MRN: [id number] DOB: 1945-12-13 Today's Date: 05/14/2015    History of present illness 69 y.o. female with PMH of hyperlipidemia, diabetes mellitus, hypothyroidism, PAF on Xaretlo, TVR, MVR, diastolic congestive heart failure (EF of 55-60%), recently diagnosed 3V CAD + ventricular tachycardia s/p St. Jude ICD 04/2015, who presented with nausea, vomiting, abdominal pain, dizziness and syncope   OT comments  Pt does not report any dizziness this session. Completed education regarding safety with ADL and pt given written UE strengthening theraband HEP. Pt safe to D/C home when medically stable but will need S with all functional mobility @ RW level initially.  Follow Up Recommendations  No OT follow up;Supervision/Assistance - 24 hour    Equipment Recommendations  None recommended by OT    Recommendations for Other Services      Precautions / Restrictions Precautions Precautions: Fall Restrictions Weight Bearing Restrictions: No       Mobility Bed Mobility Overal bed mobility: Modified Independent                Transfers Overall transfer level: Needs assistance   Transfers: Sit to/from Stand;Stand Pivot Transfers Sit to Stand: Supervision Stand pivot transfers: Supervision       General transfer comment: LOBposteriorly during turn. Discussed keeping weight forward ontoes when turning and staying in RW.    Balance                                   ADL                                       Functional mobility during ADLs: Supervision/safety;Rolling walker;Cueing for safety General ADL Comments: Educated pt on reducing risk of falls at home.Ptstaes she fell over the weekend because her knee gave out on her. Discussed safely using the RW during functional mobility and staying positioned inside of the walker in case her knee gave outon her she could catch herself with  her arms.Pt able to return demonstrate. Discussed home set up to maximize safty and independence. Recommeded pt install grab bars in shower and have her daughter presnt shen she showered until she returns to her baseline. Discussed PT recommendationfor HHPT.Ptsteas she does not want HHPT.Educated pton the benefit of HHPT and encouraged pt to consider participating with HHPT.       Vision                     Perception     Praxis      Cognition   Behavior During Therapy: WFL for tasks assessed/performed Overall Cognitive Status: Within Functional Limits for tasks assessed                       Extremity/Trunk Assessment               Exercises Other Exercises Other Exercises: Performed level 1 theraband exercises with bilateral UEs- flex/ Ab/diagonals - writtenexercises given Other Exercises: Discussed doing modified squats standing at sink to strength legs for ADL   Shoulder Instructions       General Comments      Pertinent Vitals/ Pain          Home Living  Prior Functioning/Environment              Frequency Min 2X/week     Progress Toward Goals  OT Goals(current goals can now be found in the care plan section)  Progress towards OT goals: Progressing toward goals  Acute Rehab OT Goals Patient Stated Goal: get back to cooking OT Goal Formulation: With patient Time For Goal Achievement: 05/19/15 Potential to Achieve Goals: Good ADL Goals Pt Will Perform Lower Body Dressing: with modified independence;sit to/from stand Pt Will Transfer to Toilet: with modified independence;ambulating;regular height toilet Additional ADL Goal #1: Pt will independently perform HEP for bilateral UEs to increase strength.  Plan Discharge plan remains appropriate    Co-evaluation                 End of Session Equipment Utilized During Treatment: Gait belt;Rolling walker   Activity  Tolerance Patient tolerated treatment well   Patient Left in bed;with call bell/phone within reach;with nursing/sitter in room   Nurse Communication Mobility status        Time: 1106-1130 OT Time Calculation (min): 24 min  Charges: OT General Charges $OT Visit: 1 Procedure OT Treatments $Self Care/Home Management : 8-22 mins $Therapeutic Activity: 8-22 mins  Herson Prichard,HILLARY 05/14/2015, 11:42 AM   Maurie Boettcher, OTR/L  [phone] 05/14/2015

## 2015-05-15 ENCOUNTER — Telehealth: Payer: Self-pay

## 2015-05-15 NOTE — Telephone Encounter (Signed)
Transitional Care Clinic Post-discharge Follow-Up Phone Call:  Date of Discharge: 05/14/15 Principal Discharge Diagnosis(es): syncope/orthostatic hypotension, DM, hypertrophic cardiomyopathy Post-discharge Communication:Call placed to the patient Call Completed: Yes                    With Whom: Patient Interpreter Needed: No     Please check all that apply:  X Patient is knowledgeable of his/her condition(s) and/or treatment. ? Patient is caring for self at home.  ? Patient is receiving assist at home from family and/or caregiver. Family and/or caregiver is knowledgeable of patient's condition(s) and/or treatment. ? Patient is receiving home health services. If so, name of agency.     Medication Reconciliation:  X Medication list reviewed with patient. X Patient obtained all discharge medications. She currently has all of her medications. She said that she has not started taking the metoprolol yet; but will take it tonight. She also reported that she is not taking the amiodirone and did take it in the hospital and noted that she is concerned about the side effects of the medication.  She said that she has enough xarelto for a few days and will bring her prescription to the [facility] pharmacy along with her coupon for the medication. She stated that she understands that the first month of xarelto is free with the coupon but she was concerned about the cost going forward. This CM spoke to Sula Rumple, [facility] Pharmacist, who said that they will enroll her with the PASS program and the medication will be no cost to her. This CM then explained to the patient about enrolling in the PASS program for medication coverage.  She was very appreciative of the information     Activities of Daily Living:  X  Independent - lives with her daughter and grandson.  ? Needs assist (describe; ? home DME used) ? [facility] (describe, ? home DME used)   Community resources in place for patient:  X None  ? Home  Health/Home DME ? Assisted Living ? Support Group          Patient Education: Instructed her about the importance of obtaining a prescription drug plan. She has the information for [facility] and the [employer] and understands that open enrollment for [employer] is approaching.  Discussed the importance of medication compliance.   Instructed her to bring all of her medication and her glucometer to her appointment at [facility] and she verbalized understanding. Discussed the services provided by the pharmacist in the [facility] who can provide medication counseling and diabetic education.  She denied any nausea, vomiting, headache, falls. She said that her dizziness has improved and she is very careful to pace her activities and to change positions slowly, especially when going from sitting to standing. She said that overall she feels " wonderful."         Questions/Concerns discussed: She reported no other concerns and confirmed that she has transportation and will be at her appointment on 05/21/15 @ 1115.

## 2015-05-16 ENCOUNTER — Encounter: Payer: Self-pay | Admitting: Internal Medicine

## 2015-05-18 ENCOUNTER — Telehealth: Payer: Self-pay

## 2015-05-18 NOTE — Telephone Encounter (Signed)
This Case Manager placed call to patient to check on status and to remind patient of upcoming [facility] appointment on 05/21/15 at 1115 with Dr. Jarold Song. Call placed to [phone]; unable to reach patient. Left HIPPA compliant voicemail requesting return call. In addition, placed call to patient's emergency contact Rita Ohara) at [phone] and left HIPPA compliant voicemail requesting call back.

## 2015-05-21 ENCOUNTER — Ambulatory Visit: Payer: Medicare Other | Attending: Family Medicine | Admitting: Family Medicine

## 2015-05-21 ENCOUNTER — Telehealth: Payer: Self-pay

## 2015-05-21 ENCOUNTER — Encounter: Payer: Self-pay | Admitting: Family Medicine

## 2015-05-21 VITALS — BP 127/74 | HR 73 | Temp 98.0°F | Resp 16 | Ht 64.0 in | Wt 158.0 lb

## 2015-05-21 DIAGNOSIS — Z95810 Presence of automatic (implantable) cardiac defibrillator: Secondary | ICD-10-CM | POA: Insufficient documentation

## 2015-05-21 DIAGNOSIS — I5032 Chronic diastolic (congestive) heart failure: Secondary | ICD-10-CM | POA: Insufficient documentation

## 2015-05-21 DIAGNOSIS — I2510 Atherosclerotic heart disease of native coronary artery without angina pectoris: Secondary | ICD-10-CM | POA: Diagnosis not present

## 2015-05-21 DIAGNOSIS — R938 Abnormal findings on diagnostic imaging of other specified body structures: Secondary | ICD-10-CM | POA: Insufficient documentation

## 2015-05-21 DIAGNOSIS — Z7984 Long term (current) use of oral hypoglycemic drugs: Secondary | ICD-10-CM | POA: Diagnosis not present

## 2015-05-21 DIAGNOSIS — E119 Type 2 diabetes mellitus without complications: Secondary | ICD-10-CM | POA: Insufficient documentation

## 2015-05-21 DIAGNOSIS — Z7902 Long term (current) use of antithrombotics/antiplatelets: Secondary | ICD-10-CM | POA: Insufficient documentation

## 2015-05-21 DIAGNOSIS — E039 Hypothyroidism, unspecified: Secondary | ICD-10-CM | POA: Diagnosis not present

## 2015-05-21 DIAGNOSIS — R197 Diarrhea, unspecified: Secondary | ICD-10-CM | POA: Diagnosis not present

## 2015-05-21 DIAGNOSIS — I480 Paroxysmal atrial fibrillation: Secondary | ICD-10-CM

## 2015-05-21 DIAGNOSIS — Z87891 Personal history of nicotine dependence: Secondary | ICD-10-CM | POA: Diagnosis not present

## 2015-05-21 DIAGNOSIS — R942 Abnormal results of pulmonary function studies: Secondary | ICD-10-CM | POA: Diagnosis not present

## 2015-05-21 DIAGNOSIS — Z79899 Other long term (current) drug therapy: Secondary | ICD-10-CM | POA: Diagnosis not present

## 2015-05-21 LAB — GLUCOSE, POCT (MANUAL RESULT ENTRY): POC Glucose: 168 mg/dl — AB (ref 70–99)

## 2015-05-21 MED ORDER — METFORMIN HCL 500 MG PO TABS: 500.0000 mg | ORAL_TABLET | Freq: Every day | ORAL | Status: DC

## 2015-05-21 MED ORDER — RIVAROXABAN 20 MG PO TABS: 20.0000 mg | ORAL_TABLET | Freq: Every day | ORAL | Status: DC

## 2015-05-21 NOTE — Progress Notes (Signed)
Pt's here for TCC f/up and to esc.care with PCP. Pt reports having diarrhea w/o blood started last nightPt needs rx xarelto

## 2015-05-21 NOTE — Progress Notes (Signed)
[place]  Date of Telephone Encounter: 05/15/15  Admit Date: 05/11/15 Discharge Date : 05/14/15  PCP: none  Subjective:    Patient ID: [name], female    DOB: [birth year], 69 y.o.   MRN: [id number]  HPI  Zayna Toste is a 69 year old female who with a history of hypothyroidism, paroxysmal atrial fibrillation, apical hypertrophic cardiomyopathy, DM, recently diagnosed 3V CAD + ventricular tachycardia s/p St. Jude ICD 01/8114, chronic diastolic CHF (EF 72-62% by echo, 45-50% by cath) who was previously hospitalized from 04/13/15-04/23/15 and had followed up with the transitional care clinic and cardiology shortly after discharge but represented again to the ED on 05/11/15 with lightheadedness, syncope, nausea, vomiting, abdominal pain.  On presentation she was found to have a WBC of 10.9, slight bradycardia, troponins were negative, mildly elevated creatinine at 1.25. She was admitted by cardiology for further evaluation. She had a CT head which was negative for any acute intracranial process and a CT abdomen and pelvis revealed no acute abnormality, mitral valve calcification, mild scattered calcification along the abdominal aorta and its branches, scattered diverticulosis along the ascending, descending and sigmoid colon without evidence of diverticulitis. She had a normal renal ultrasound. ICD was interrogated by cardiology with no records of VT. Symptoms were thought to be secondary to her medications;metoprolol was reduced from 25 mg to 12.5 mg twice daily but the patient continued to refuse to take her medications. Her symptoms gradually improved and she was placed on Lasix when necessary with recommendations to follow-up with cardiology outpatient.   Interval history: She reports feeling well and has not felt lightheaded or dizzy since discharge. She refuses to take the amiodarone which she states was responsible for her symptoms and is not taking the metoprolol  either. She complains of diarrhea and has frequent accidents. Has an upcoming appointment with cardiology next week..  Past Medical History  Diagnosis Date  . Diabetes mellitus without complication ([place])     dx on wed  . Paroxysmal atrial fibrillation ([name])   . Hypertrophic cardiomyopathy ([place])   . Left atrial enlargement   . Tricuspid regurgitation     a. Echo 04/2015: Mod-severe TR.  [name] Ventricular tachycardia ([place])     a. s/p St. Jude ICD implanted 04/2015.  [name] CAD (coronary artery disease), native coronary artery - 3 vessel 04/21/2015    a. NSTEMI 8-04/2015 felt 2/2 demand ischemia. b. 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  . Abnormal PFT   . Chronic diastolic CHF (congestive heart failure) ([place])   . Mitral regurgitation     a. Echo 04/2015: moderate mitral regurgitation.  . Diabetes mellitus [facility])     Past Surgical History  Procedure Laterality Date  . Dental surgery  Oct. 2011    several extractions, bone graft  . I&d extremity Left 01/06/2013    Procedure: IRRIGATION AND DEBRIDEMENT LEFT ELBOW AND LEFT FOREARM ;  Surgeon: [name], MD;  Location: [facility];  Service: Orthopedics;  Laterality: Left;  . Application of wound vac Left 01/06/2013    Procedure: APPLICATION OF WOUND VAC X 2;  Surgeon: [name], MD;  Location: [facility];  Service: Orthopedics;  Laterality: Left;  left forearm  . Incision and drainage Left 01/09/2013    Procedure: REDO INCISION AND DRAINAGE LEFT ELBOW;  Surgeon: [name], MD;  Location: [facility];  Service: Orthopedics;  Laterality: Left;  SUPINE, UPPER EXTERMITY DRAPE  . Secondary closure  of wound Left 01/09/2013    Procedure: SECONDARY CLOSURE OF WOUND  LEFT ELBOW;  Surgeon: [name], MD;  Location: [facility];  Service: Orthopedics;  Laterality: Left;  . Application of wound vac Left 01/09/2013    Procedure: APPLICATION OF WOUND VAC;  Surgeon: [name], MD;  Location: [facility];  Service:  Orthopedics;  Laterality: Left;  . Ep implantable device N/A 04/16/2015    Procedure: ICD Implant;  Surgeon: [name], MD;  Location: [place] [facility];  Service: Cardiovascular;  Laterality: N/A;  . Cardiac catheterization N/A 04/19/2015    Procedure: Left Heart Cath and Coronary Angiography;  Surgeon: [name], MD;  Location: [place] [facility];  Service: Cardiovascular;  Laterality: N/A;    Social History   Social History  . Marital Status: Single    Spouse Name: N/A  . Number of Children: N/A  . Years of Education: N/A   Occupational History  . Not on file.   Social History Main Topics  . Smoking status: Former Smoker -- 4 years    Quit date: 12/31/1980  . Smokeless tobacco: Never Used  . Alcohol Use: No  . Drug Use: No  . Sexual Activity: Not Currently   Other Topics Concern  . Not on file   Social History Narrative   Divorced.  Lives with daughter and grandson. 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    No Known Allergies    Review of Systems  Constitutional: Negative for activity change, appetite change and fatigue.  HENT: Negative for congestion, sinus pressure and sore throat.   Eyes: Negative for visual disturbance.  Respiratory: Negative for cough, chest tightness, shortness of breath and wheezing.   Cardiovascular: Negative for chest pain and palpitations.  Gastrointestinal: Negative for abdominal pain, constipation and abdominal distention.  Endocrine: Negative for polydipsia.  Genitourinary: Negative for dysuria and frequency.  Musculoskeletal: Negative for back pain and arthralgias.  Skin: Negative for rash.  Neurological: Negative for tremors, light-headedness and numbness.  Hematological: Does not bruise/bleed easily.  Psychiatric/Behavioral: Negative for behavioral problems and agitation.       Objective: Filed Vitals:   05/21/15 1119  BP: 127/74  Pulse: 73  Temp: 98 F (36.7 C)  TempSrc: Oral  Resp: 16  Height: 5'  4" (1.626 m)  Weight: 158 lb (71.668 kg)  SpO2: 98%      Physical Exam  Constitutional: She is oriented to person, place, and time. She appears well-developed and well-nourished. No distress.  HENT:  Head: Normocephalic.  Right Ear: External ear normal.  Left Ear: External ear normal.  Nose: Nose normal.  Mouth/Throat: Oropharynx is clear and moist.  Eyes: Conjunctivae and EOM are normal. Pupils are equal, round, and reactive to light.  Neck: Normal range of motion. No JVD present.  Cardiovascular: Normal rate, regular rhythm, normal heart sounds and intact distal pulses.  Exam reveals no gallop.   No murmur heard. Pulmonary/Chest: Effort normal and breath sounds normal. No respiratory distress. She has no wheezes. She has no rales. She exhibits no tenderness.  Abdominal: Soft. Bowel sounds are normal. She exhibits no distension and no mass. There is no tenderness.  Musculoskeletal: Normal range of motion. She exhibits no edema or tenderness.  Neurological: She is alert and oriented to person, place, and time. She has normal reflexes.  Skin: Skin is warm and dry. She is not diaphoretic.  Psychiatric: She has a normal mood and affect.  CMP Latest Ref Rng 05/13/2015 05/12/2015 05/11/2015  Glucose 65 - 99 mg/dL 133(H) 201(H) 254(H)  BUN 6 - 20 mg/dL 26(H) 39(H) 41(H)  Creatinine 0.44 - 1.00 mg/dL 0.88 1.21(H) 1.25(H)  Sodium 135 - 145 mmol/L 137 137 136  Potassium 3.5 - 5.1 mmol/L 4.0 4.1 5.0  Chloride 101 - 111 mmol/L 102 105 100(L)  CO2 22 - 32 mmol/L 26 21(L) 27  Calcium 8.9 - 10.3 mg/dL 9.1 9.2 9.5  Total Protein 6.5 - 8.1 g/dL - 6.8 -  Total Bilirubin 0.3 - 1.2 mg/dL - 0.5 -  Alkaline Phos 38 - 126 U/L - 81 -  AST 15 - 41 U/L - 18 -  ALT 14 - 54 U/L - 16 -         Assessment & Plan:  1. Type 2 diabetes mellitus without complication I9S 7.5, CBG 168 Continue Metformin which I have decreased to daily dosing due to diarrhea - Glucose (CBG)  2. Paroxysmal atrial  fibrillation Anticoagulated with Xarelto. She is yet to commence metoprolol for rate control due to fears of hypotension  3. Coronary artery disease involving native coronary artery of native heart without angina pectoris 3 vessel CAD Managed by Heart Care CABG planned against the near future Continue aggressive lifestyle modifications  4. Hypothyroidism due to medication Last TSH was normal at 2.235 Patient was initially resistant to commencing Levothyroxine due to the fact that hypothyroidism was likely Amiodarone induced. Will  need a repeat TSH at her next visit  5. Abnormal PFT She will need a repeat PFT to monitor pulmonary function given the use of Amiodarone and especially in the setting a a current abnormal one revealing severe obstructive airway disease and severe parenchymal restriction.  6. Diarrhea: Could be secondary to Metformin which i have decreased from 500mg  twice daily to 500mg  daily.    This note has been created with Surveyor, quantity. Any transcriptional errors are unintentional.

## 2015-05-21 NOTE — Telephone Encounter (Signed)
Met with the patient when she was at [facility] today for her scheduled appointment with the TCC.  Provided her with the phone # for the [facility] program again and encouraged her to call / schedule an appointment with the counselor to discuss options for medicare supplements and advantage plans as she has no prescription coverage. [name]  She was appreciative of the information and stated that she would call.

## 2015-05-21 NOTE — Patient Instructions (Signed)

## 2015-05-23 ENCOUNTER — Encounter: Payer: Self-pay | Admitting: Internal Medicine

## 2015-05-25 ENCOUNTER — Encounter: Payer: Self-pay | Admitting: *Deleted

## 2015-05-28 ENCOUNTER — Telehealth: Payer: Self-pay

## 2015-05-28 NOTE — Telephone Encounter (Signed)
Call placed to the patient to check on her status. She said that she " feels great" and is a now able to cook again. No falls reported. She noted that she has all of her medications and has been taking them except for the amiodarone and metoprolol.  She said that she tried the metoprolol 12.5mg  once and became very dizzy and did not take it again.   She reported no problems with the metformin.  She said that her blood sugars have ranged from 120's-130's with occasional high results of 150-160's.    She said that she has not contacted the [facility] counselor yet but plans to attend a workshop sponsored by [employer] to learn more about what they offer.   No other concerns/questons reported.  She said that she will schedule her next appointment at at later date.  As her her AVS, a follow up appointment is recommended around 06/21/15.   An update was provided to Dr Jarold Song regarding the metoprolol.

## 2015-05-30 ENCOUNTER — Ambulatory Visit: Payer: Medicare Other | Admitting: Cardiology

## 2015-05-30 ENCOUNTER — Other Ambulatory Visit: Payer: Medicare Other

## 2015-06-04 ENCOUNTER — Encounter: Payer: Self-pay | Admitting: Internal Medicine

## 2015-06-11 ENCOUNTER — Telehealth: Payer: Self-pay

## 2015-06-11 NOTE — Telephone Encounter (Signed)
Attempted to contact the patient to check on her status and to discuss scheduling an appointment to establish care at [facility].  Call placed to # [phone] (M) and a voice mail message was left requesting a call back to # [phone] or # [phone].

## 2015-06-13 ENCOUNTER — Telehealth: Payer: Self-pay

## 2015-06-13 NOTE — Telephone Encounter (Signed)
This Case Manager placed call to patient to determine status and to discuss need for follow-up appointment with Dr. Jarold Song. Unable to reach patient; voicemail left requesting return call.

## 2015-06-18 ENCOUNTER — Telehealth: Payer: Self-pay

## 2015-06-18 NOTE — Telephone Encounter (Signed)
Attempted to contact the patient to check on her status and to discuss scheduling an appointment to establish care with a PCP. Call placed to # [phone] (M) and a voice mail message was left requesting a call back to # [phone] or [phone].

## 2015-06-26 ENCOUNTER — Telehealth: Payer: Self-pay

## 2015-06-26 NOTE — Telephone Encounter (Signed)
Attempted to contact the patient to check on her status and to discuss scheduling an appointment to establish care with a PCP. Call placed to [phone] and a HIPAA compliant voice mail message was left requesting a call back to # [phone] or [phone].

## 2015-07-03 ENCOUNTER — Telehealth: Payer: Self-pay

## 2015-07-03 NOTE — Telephone Encounter (Signed)
Attempted to contact the patient to check on her status and to discuss scheduling an appointment to establish care with a provider at [facility]. Call placed to # [phone] (M) and a HIPAA compliant message was left requesting a call back to # [phone] or [phone].

## 2015-07-06 ENCOUNTER — Encounter: Payer: Self-pay | Admitting: Internal Medicine

## 2015-07-12 NOTE — Telephone Encounter (Signed)
This RN was out on FMLA when the request was routed.  Patient also contacted her cardiology office who did advise her.

## 2015-07-17 ENCOUNTER — Encounter: Payer: Medicare Other | Admitting: Internal Medicine

## 2015-07-18 ENCOUNTER — Encounter: Payer: Self-pay | Admitting: Family Medicine

## 2015-07-18 ENCOUNTER — Ambulatory Visit: Payer: Medicare Other | Attending: Family Medicine | Admitting: Family Medicine

## 2015-07-18 VITALS — BP 164/80 | HR 74 | Temp 97.6°F | Resp 16 | Ht 64.0 in | Wt 164.0 lb

## 2015-07-18 DIAGNOSIS — Z95810 Presence of automatic (implantable) cardiac defibrillator: Secondary | ICD-10-CM | POA: Insufficient documentation

## 2015-07-18 DIAGNOSIS — J208 Acute bronchitis due to other specified organisms: Secondary | ICD-10-CM | POA: Insufficient documentation

## 2015-07-18 DIAGNOSIS — E039 Hypothyroidism, unspecified: Secondary | ICD-10-CM | POA: Diagnosis not present

## 2015-07-18 DIAGNOSIS — I10 Essential (primary) hypertension: Secondary | ICD-10-CM | POA: Insufficient documentation

## 2015-07-18 DIAGNOSIS — I5032 Chronic diastolic (congestive) heart failure: Secondary | ICD-10-CM | POA: Insufficient documentation

## 2015-07-18 DIAGNOSIS — Z7984 Long term (current) use of oral hypoglycemic drugs: Secondary | ICD-10-CM | POA: Insufficient documentation

## 2015-07-18 DIAGNOSIS — I422 Other hypertrophic cardiomyopathy: Secondary | ICD-10-CM | POA: Insufficient documentation

## 2015-07-18 DIAGNOSIS — E119 Type 2 diabetes mellitus without complications: Secondary | ICD-10-CM | POA: Diagnosis not present

## 2015-07-18 DIAGNOSIS — Z87891 Personal history of nicotine dependence: Secondary | ICD-10-CM | POA: Insufficient documentation

## 2015-07-18 DIAGNOSIS — I4891 Unspecified atrial fibrillation: Secondary | ICD-10-CM | POA: Diagnosis not present

## 2015-07-18 DIAGNOSIS — I252 Old myocardial infarction: Secondary | ICD-10-CM | POA: Diagnosis not present

## 2015-07-18 DIAGNOSIS — I480 Paroxysmal atrial fibrillation: Secondary | ICD-10-CM | POA: Diagnosis not present

## 2015-07-18 DIAGNOSIS — I340 Nonrheumatic mitral (valve) insufficiency: Secondary | ICD-10-CM | POA: Diagnosis not present

## 2015-07-18 DIAGNOSIS — I2510 Atherosclerotic heart disease of native coronary artery without angina pectoris: Secondary | ICD-10-CM | POA: Diagnosis not present

## 2015-07-18 LAB — POCT GLYCOSYLATED HEMOGLOBIN (HGB A1C): Hemoglobin A1C: 9

## 2015-07-18 LAB — GLUCOSE, POCT (MANUAL RESULT ENTRY): POC Glucose: 162 mg/dl — AB (ref 70–99)

## 2015-07-18 MED ORDER — GLIPIZIDE 5 MG PO TABS: 5.0000 mg | ORAL_TABLET | Freq: Two times a day (BID) | ORAL | Status: DC

## 2015-07-18 MED ORDER — AZITHROMYCIN 250 MG PO TABS: ORAL_TABLET | ORAL | Status: DC

## 2015-07-18 MED ORDER — DILTIAZEM HCL ER COATED BEADS 120 MG PO CP24: 120.0000 mg | ORAL_CAPSULE | Freq: Every day | ORAL | Status: DC

## 2015-07-18 NOTE — Progress Notes (Signed)
Subjective:    Patient ID: [name], female    DOB: [birth year], 69 y.o.   MRN: [id number]  HPI 69 year old female with a history of Type 2 Diabetes Mellitus, Hypertension, Hypothyroidism, CAD, ventricular tachycardia who comes in complaining of cough productive of yellowish sputum, chest congestion, wheezing for the last 1 week but denies history of fever. She has also had diarrhea for about 2-3 days but denies abdominal pain.  She did give a history of positive sick contact in her daughter who is currently on antibiotics.  Patient is currently being managed by cardiology but has been refusing to take half metoprolol oral amiodarone because they make her feel dizzy. She was also on metformin twice daily which had to be cut back to once daily due to the fact that she had diarrhea on it. She has been compliant with her levothyroxine.  Past Medical History  Diagnosis Date  . Diabetes mellitus without complication ([place])     dx on wed  . Paroxysmal atrial fibrillation ([name])   . Hypertrophic cardiomyopathy ([place])   . Left atrial enlargement   . Tricuspid regurgitation     a. Echo 04/2015: Mod-severe TR.  [name] Ventricular tachycardia ([place])     a. s/p St. Jude ICD implanted 04/2015.  [name] CAD (coronary artery disease), native coronary artery - 3 vessel 04/21/2015    a. NSTEMI 8-04/2015 felt 2/2 demand ischemia. b. 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  . Abnormal PFT   . Chronic diastolic CHF (congestive heart failure) ([place])   . Mitral regurgitation     a. Echo 04/2015: moderate mitral regurgitation.  . Diabetes mellitus [facility])     Past Surgical History  Procedure Laterality Date  . Dental surgery  Oct. 2011    several extractions, bone graft  . I&d extremity Left 01/06/2013    Procedure: IRRIGATION AND DEBRIDEMENT LEFT ELBOW AND LEFT FOREARM ;  Surgeon: [name], MD;  Location: [facility];  Service: Orthopedics;  Laterality: Left;    . Application of wound vac Left 01/06/2013    Procedure: APPLICATION OF WOUND VAC X 2;  Surgeon: [name], MD;  [facility]: [facility];  Service: Orthopedics;  Laterality: Left;  left forearm  . Incision and drainage Left 01/09/2013    Procedure: REDO INCISION AND DRAINAGE LEFT ELBOW;  Surgeon: [name], MD;  [facility];  Service: Orthopedics;  Laterality: Left;  SUPINE, UPPER EXTERMITY DRAPE  . Secondary closure of wound Left 01/09/2013    Procedure: SECONDARY CLOSURE OF WOUND  LEFT ELBOW;  Surgeon: [name], MD;  [facility];  Service: Orthopedics;  Laterality: Left;  . Application of wound vac Left 01/09/2013    Procedure: APPLICATION OF WOUND VAC;  Surgeon: [name], MD;  [facility];  Service: Orthopedics;  Laterality: Left;  . Ep implantable device N/A 04/16/2015    Procedure: ICD Implant;  Surgeon: [name], MD;  Location: [place] [facility];  Service: Cardiovascular;  Laterality: N/A;  . Cardiac catheterization N/A 04/19/2015    Procedure: Left Heart Cath and Coronary Angiography;  Surgeon: [name], MD;  Location: [place] [facility];  Service: Cardiovascular;  Laterality: N/A;    Social History   Social History  . Marital Status: Single    Spouse Name: N/A  . Number of Children: N/A  . Years of Education: N/A   Occupational History  .  Not on file.   Social History Main Topics  . Smoking status: Former Smoker -- 4 years    Quit date: 12/31/1980  . Smokeless tobacco: Never Used  . Alcohol Use: No  . Drug Use: No  . Sexual Activity: Not Currently   Other Topics Concern  . Not on file   Social History Narrative   Divorced.  Lives with daughter and grandson. 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     Review of Systems  Constitutional: Negative for activity change, appetite change and fatigue.  HENT: Negative for sinus pressure and sore throat.        See hpi  Eyes: Negative for visual disturbance.  Respiratory:  Positive for cough and wheezing. Negative for chest tightness and shortness of breath.   Cardiovascular: Negative for chest pain and palpitations.  Gastrointestinal: Positive for diarrhea. Negative for abdominal pain, constipation and abdominal distention.  Endocrine: Negative for polydipsia.  Genitourinary: Negative for dysuria and frequency.  Musculoskeletal: Negative for back pain and arthralgias.  Skin: Negative for rash.  Neurological: Negative for tremors, light-headedness and numbness.  Hematological: Does not bruise/bleed easily.  Psychiatric/Behavioral: Negative for behavioral problems and agitation.       Objective: Filed Vitals:   07/18/15 1420 07/18/15 1452  BP: 165/82 164/80  Pulse: 74   Temp: 97.6 F (36.4 C)   TempSrc: Oral   Resp: 16   Height: 5\' 4"  (1.626 m)   Weight: 164 lb (74.39 kg)   SpO2: 98%       Physical Exam  Constitutional: She is oriented to person, place, and time. She appears well-developed and well-nourished.  Cardiovascular: Normal rate, normal heart sounds and intact distal pulses.   No murmur heard. Pulmonary/Chest: Effort normal and breath sounds normal. She has no wheezes. She has no rales. She exhibits no tenderness.  Abdominal: Soft. Bowel sounds are normal. She exhibits no distension and no mass. There is no tenderness.  Musculoskeletal: Normal range of motion.  Neurological: She is alert and oriented to person, place, and time.  Skin: Skin is warm and dry.          Assessment & Plan:  1. Type 2 diabetes mellitus without complication 123456 has trended up from 7.5 to 9.0 Glipizide added to regimen.  2. Paroxysmal atrial fibrillation Anticoagulated with Xarelto. Placed on Cardizem hopefully this will help with control.  3. Coronary artery disease involving native coronary artery of native heart without angina pectoris 3 vessel CAD [facility] by [facility] CABG planned against the near future Continue aggressive lifestyle  modifications  4. Hypothyroidism due to medication Last TSH was normal at 2.235 Continue levothyroxine   5. Abnormal PFT She will need a repeat PFT to monitor pulmonary function given the use of Amiodarone and especially in the setting of current abnormal one from 04/2015 revealing severe obstructive airway disease and severe parenchymal restriction.  6. Acute bronchitis Treated  7. Hypertension Will reassess blood pressure at next visit ; for now Cardizem started    This note has been created with Surveyor, quantity. Any transcriptional errors are unintentional.

## 2015-07-18 NOTE — Patient Instructions (Signed)
Diabetes Mellitus and Food It is important for you to manage your blood sugar (glucose) level. Your blood glucose level can be greatly affected by what you eat. Eating healthier foods in the appropriate amounts throughout the day at about the same time each day will help you control your blood glucose level. It can also help slow or prevent worsening of your diabetes mellitus. Healthy eating may even help you improve the level of your blood pressure and reach or maintain a healthy weight.  General recommendations for healthful eating and cooking habits include:  Eating meals and snacks regularly. Avoid going long periods of time without eating to lose weight.  Eating a diet that consists mainly of plant-based foods, such as fruits, vegetables, nuts, legumes, and whole grains.  Using low-heat cooking methods, such as baking, instead of high-heat cooking methods, such as deep frying. Work with your dietitian to make sure you understand how to use the Nutrition Facts information on food labels. HOW CAN FOOD AFFECT ME? Carbohydrates Carbohydrates affect your blood glucose level more than any other type of food. Your dietitian will help you determine how many carbohydrates to eat at each meal and teach you how to count carbohydrates. Counting carbohydrates is important to keep your blood glucose at a healthy level, especially if you are using insulin or taking certain medicines for diabetes mellitus. Alcohol Alcohol can cause sudden decreases in blood glucose (hypoglycemia), especially if you use insulin or take certain medicines for diabetes mellitus. Hypoglycemia can be a life-threatening condition. Symptoms of hypoglycemia (sleepiness, dizziness, and disorientation) are similar to symptoms of having too much alcohol.  If your health care provider has given you approval to drink alcohol, do so in moderation and use the following guidelines:  Women should not have more than one drink per day, and men  should not have more than two drinks per day. One drink is equal to:  12 oz of beer.  5 oz of wine.  1 oz of hard liquor.  Do not drink on an empty stomach.  Keep yourself hydrated. Have water, diet soda, or unsweetened iced tea.  Regular soda, juice, and other mixers might contain a lot of carbohydrates and should be counted. WHAT FOODS ARE NOT RECOMMENDED? As you make food choices, it is important to remember that all foods are not the same. Some foods have fewer nutrients per serving than other foods, even though they might have the same number of calories or carbohydrates. It is difficult to get your body what it needs when you eat foods with fewer nutrients. Examples of foods that you should avoid that are high in calories and carbohydrates but low in nutrients include:  Trans fats (most processed foods list trans fats on the Nutrition Facts label).  Regular soda.  Juice.  Candy.  Sweets, such as cake, pie, doughnuts, and cookies.  Fried foods. WHAT FOODS CAN I EAT? Eat nutrient-rich foods, which will nourish your body and keep you healthy. The food you should eat also will depend on several factors, including:  The calories you need.  The medicines you take.  Your weight.  Your blood glucose level.  Your blood pressure level.  Your cholesterol level. You should eat a variety of foods, including:  Protein.  Lean cuts of meat.  Proteins low in saturated fats, such as fish, egg whites, and beans. Avoid processed meats.  Fruits and vegetables.  Fruits and vegetables that may help control blood glucose levels, such as apples, mangoes, and   yams.  Dairy products.  Choose fat-free or low-fat dairy products, such as milk, yogurt, and cheese.  Grains, bread, pasta, and rice.  Choose whole grain products, such as multigrain bread, whole oats, and brown rice. These foods may help control blood pressure.  Fats.  Foods containing healthful fats, such as nuts,  avocado, olive oil, canola oil, and fish. DOES EVERYONE WITH DIABETES MELLITUS HAVE THE SAME MEAL PLAN? Because every person with diabetes mellitus is different, there is not one meal plan that works for everyone. It is very important that you meet with a dietitian who will help you create a meal plan that is just right for you.   This information is not intended to replace advice given to you by your health care provider. Make sure you discuss any questions you have with your health care provider.   Document Released: 04/17/2005 Document Revised: 08/11/2014 Document Reviewed: 06/17/2013 Elsevier Interactive Patient Education 2016 Elsevier Inc.  

## 2015-07-18 NOTE — Progress Notes (Signed)
Patient's here for f/up DM and Hypothyroidism. Patient also c/o  possible upper resp Inf. [name].   Patient c/o cough with yellowish discharge, chest congestions, wheezing. Patient denies pain.

## 2015-07-20 ENCOUNTER — Encounter: Payer: Medicare Other | Admitting: Internal Medicine

## 2015-08-07 MED FILL — ?ATORVASTATIN 40MG TABLET: 40 | 30 days supply | Qty: 30 | Fill #3

## 2015-08-07 MED FILL — ?METFORMIN HCL 500MG TABLET: 500 | 30 days supply | Qty: 30 | Fill #2

## 2015-08-07 MED FILL — LEVOTHYROXINE 50 MCG TABLET: 50 | 30 days supply | Qty: 30 | Fill #3

## 2015-08-21 ENCOUNTER — Encounter: Payer: Medicare Other | Admitting: Internal Medicine

## 2015-08-21 MED FILL — XARELTO 20 MG TABLET: 20 | 30 days supply | Qty: 30 | Fill #2

## 2015-08-23 DIAGNOSIS — Z961 Presence of intraocular lens: Secondary | ICD-10-CM | POA: Diagnosis not present

## 2015-08-23 DIAGNOSIS — E119 Type 2 diabetes mellitus without complications: Secondary | ICD-10-CM | POA: Diagnosis not present

## 2015-08-30 ENCOUNTER — Encounter: Payer: Medicare Other | Admitting: Internal Medicine

## 2015-09-06 ENCOUNTER — Other Ambulatory Visit: Payer: Self-pay | Admitting: Family Medicine

## 2015-09-06 MED FILL — ?ATORVASTATIN 40MG TABLET: 40 | 30 days supply | Qty: 30 | Fill #4

## 2015-09-06 MED FILL — ?METFORMIN HCL 500MG TABLET: 500 | 30 days supply | Qty: 30 | Fill #3

## 2015-09-07 MED FILL — ?LEVOTHYROXINE 50 MCG TABLE: 50 | 30 days supply | Qty: 30 | Fill #0 | Status: TO

## 2015-09-10 DIAGNOSIS — H4311 Vitreous hemorrhage, right eye: Secondary | ICD-10-CM | POA: Diagnosis not present

## 2015-09-10 DIAGNOSIS — H4312 Vitreous hemorrhage, left eye: Secondary | ICD-10-CM | POA: Diagnosis not present

## 2015-09-10 DIAGNOSIS — E113412 Type 2 diabetes mellitus with severe nonproliferative diabetic retinopathy with macular edema, left eye: Secondary | ICD-10-CM | POA: Diagnosis not present

## 2015-09-10 DIAGNOSIS — E113511 Type 2 diabetes mellitus with proliferative diabetic retinopathy with macular edema, right eye: Secondary | ICD-10-CM | POA: Diagnosis not present

## 2015-09-12 ENCOUNTER — Telehealth: Payer: Self-pay | Admitting: Internal Medicine

## 2015-09-12 ENCOUNTER — Encounter: Payer: Self-pay | Admitting: Physician Assistant

## 2015-09-12 NOTE — Telephone Encounter (Signed)
Called and left message for the patient and let her know she can be seen tomorrow. She has missed several appointments and I have added her on to [employer] schedule tomorrow.  I have asked that she call back to confirm

## 2015-09-12 NOTE — Progress Notes (Addendum)
Cardiology Office Note Date:  09/13/2015  Patient ID:  [name], [name] 1946/03/10, MRN [id number] PCP:  Lujean Amel, MD  Cardiologist:  Initially Mare Ferrari but seen by Dr. Meda Coffee last admission.  EP: Dr. Lovena Le  Chief Complaint: SOB, edema  History of Present Illness: [name] is a 70 y.o. female with history of (retired psychiatric nurse) with history of paroxysmal atrial fibrillation, apical hypertrophic cardiomyopathy, DM, 3V CAD by cath 04/2015 (awaiting eval for CABG), ventricular tachycardia s/p St. Jude ICD [id number], chronic diastolic CHF (EF 0000000 by echo, 45-50% by cath) who presents for evaluation of SOB and edema.  Per review of complex history, she had admission in 03/2015 for urosepsis, near syncope and AF RVR. (Prior to that admission she had previously been on amiodarone and Xarelto in 2014 but had self-d/c'd meds.) Troponins peaked at 0.24 and initially outpatient stress test was recommended. She was treated for CHF. Given LA enlargement, rate control strategy was chosen and anticoagulation was re-initiated. She was readmitted 9/2-04/13/15 with symptomatic paroxysmal ventricular tachycardia. Despite magnesium and amiodarone she continued to have sustained runs and required initiation of lidocaine along with calcium gluconate and additional magnesium. She eventually was switched to oral amiodarone and underwent successful ICD implantation with recommendation for no driving x 6 months. Coreg was changed to metoprolol. She was diuresed for a/c CHF. She underwent coronary CT suspicious for CAD and subsequently underwent cardiac cath showing 3V CAD, LVEDP 24, LVEF 45-50%. Her PFTs showed severely decreased FVC and FEV1, although her pleural effusions could be contributing to that issue and it was felt worth having pulmonary evaluate her at some point. Cardiac surgery evaluated the patient and recommended medical therapy initially and then revaluate for CABG as outpatient in 2-3  months. She was re-admitted 05/2015 for syncope felt secondary to orthostasis. Interrogation showed no VT and no atrial fib. During that admission she had refused to take any medications other than Synthroid. Most notably she declined to take further metoprolol or amiodarone. 2D echo 05/12/15: EF 60-65%, asymmetric septal hypertrophy c/w hypertrophic cardiomyopathy, no SAM or dynamic obstructive gradient at rest, no RWMA, grade 1 DD, mild AI, mild MR, severely dilated LA. She has not been seen in the office since September. She has cancelled all f/u appointments with Dr. Mare Ferrari on 10/26, Cecilie Kicks on 10/26, Dr. Lovena Le on 12/13, 12/16, 1/17, and 1/26. She has not been back to see CT surgery.  For the past week she has noticed progressive dyspnea with exertion, palpitations, orthopnea, LEE, and weight gain. Weight is up 13lbs from December. She has not had any further syncope. No chest pain. She has been taking Lasix 40mg  daily for the last several days without significant improvement. She reports compliance with her Xarelto. She is in rapid atypical atrial flutter today with a HR of 136. Device interrogation confirms out of rhythm at least since 09/03/15. The interrogation did show gradually increasing high rate burden as of early January but unfortunately did not give more specific information about the number of discrete episodes since that time.   Past Medical History  Diagnosis Date  . Diabetes mellitus without complication ([name])     dx on wed  . Paroxysmal atrial fibrillation ([name])   . Hypertrophic cardiomyopathy ([name])   . Left atrial enlargement   . Tricuspid regurgitation     a. Echo 04/2015: Mod-severe TR. b. Not mentioned on echo 05/2015  . Ventricular tachycardia ([name])     a. s/p St. Jude ICD implanted 04/2015.  [name]  CAD (coronary artery disease), native coronary artery - 3 vessel 04/21/2015    a. NSTEMI 8-04/2015 felt 2/2 demand ischemia. b. 3V CAD by cath 04/2015, med rx initially recommended  and considering CABG in several months.  . Abnormal PFT   . Chronic diastolic CHF (congestive heart failure) ([name])   . Mitral regurgitation     a. Echo 04/2015: moderate mitral regurgitation. b. F/u echo 05/2015: mild MR.  . Diabetes mellitus [name])     Past Surgical History  Procedure Laterality Date  . Dental surgery  Oct. 2011    several extractions, bone graft  . I&d extremity Left 01/06/2013    Procedure: IRRIGATION AND DEBRIDEMENT LEFT ELBOW AND LEFT FOREARM ;  Surgeon: [name], MD;  [facility];  Service: Orthopedics;  Laterality: Left;  . Application of wound vac Left 01/06/2013    Procedure: APPLICATION OF WOUND VAC X 2;  Surgeon: [name], MD;  [facility];  Service: Orthopedics;  Laterality: Left;  left forearm  . Incision and drainage Left 01/09/2013    Procedure: REDO INCISION AND DRAINAGE LEFT ELBOW;  Surgeon: [name], MD;  [facility];  Service: Orthopedics;  Laterality: Left;  SUPINE, UPPER EXTERMITY DRAPE  . Secondary closure of wound Left 01/09/2013    Procedure: SECONDARY CLOSURE OF WOUND  LEFT ELBOW;  Surgeon: [name], MD;  [facility];  Service: Orthopedics;  Laterality: Left;  . Application of wound vac Left 01/09/2013    Procedure: APPLICATION OF WOUND VAC;  Surgeon: [name], MD;  [facility];  Service: Orthopedics;  Laterality: Left;  . Ep implantable device N/A 04/16/2015    Procedure: ICD Implant;  Surgeon: [name], MD;  Location: [place] [facility];  Service: Cardiovascular;  Laterality: N/A;  . Cardiac catheterization N/A 04/19/2015    Procedure: Left Heart Cath and Coronary Angiography;  Surgeon: [name], MD;  Location: [place] [facility];  Service: Cardiovascular;  Laterality: N/A;    Current Outpatient Prescriptions  Medication Sig Dispense Refill  . atorvastatin (LIPITOR) 40 MG tablet Take 1 tablet (40 mg total) by mouth daily at 6 PM. 30 tablet 3  . Blood Glucose Monitoring Suppl (TRUE  METRIX METER) DEVI 1 each by Does not apply route 2 (two) times daily. 1 Device 0  . diltiazem (CARDIZEM CD) 120 MG 24 hr capsule Take 1 capsule (120 mg total) by mouth daily. 30 capsule 1  . furosemide (LASIX) 40 MG tablet Take 40 mg by mouth daily as needed for fluid or edema (TAKE 40 MG BY MOUTH DAILY AS NEEDED IF LEG SWELLING OR IF GAIN 2LBS).    [name] glipiZIDE (GLUCOTROL) 5 MG tablet Take 5 mg by mouth daily.    [name] glucose blood (TRUE METRIX BLOOD GLUCOSE TEST) test strip Use as instructed twice daily 100 each 12  . levothyroxine (SYNTHROID, LEVOTHROID) 50 MCG tablet TAKE 1 TABLET BY MOUTH DAILY 30 tablet 2  . metFORMIN (GLUCOPHAGE) 500 MG tablet Take 1 tablet (500 mg total) by mouth daily with breakfast. 60 tablet 3  . rivaroxaban (XARELTO) 20 MG TABS tablet Take 1 tablet (20 mg total) by mouth daily. 90 tablet 3  . TRUEPLUS LANCETS 28G MISC Use as instructed twice daily. 60 each 5   No current facility-administered medications for this visit.    Allergies:   Review of patient's allergies indicates no known allergies.   Social History:  The patient  reports that  she quit smoking about 34 years ago. She has never used smokeless tobacco. She reports that she does not drink alcohol or use illicit drugs.   Family History:  The patient's family history includes Breast cancer in her mother; Diabetes in her father; Healthy in her sister; Heart disease in her sister; Stroke in her father.  ROS:  Please see the history of present illness.   All other systems are reviewed and otherwise negative.   PHYSICAL EXAM:  VS:  BP 140/80 mmHg  Pulse 132  Ht 5\' 4"  (1.626 m)  Wt 177 lb 12.8 oz (80.65 kg)  BMI 30.50 kg/m2  SpO2 98% BMI: Body mass index is 30.5 kg/(m^2). Well nourished, well developed WF, in no acute distress HEENT: normocephalic, atraumatic Neck: no JVD, carotid bruits or masses Cardiac:  normal S1, S2; reg rhythm but tachycardic; no murmurs, rubs, or gallops Lungs:  clear to  auscultation bilaterally, no wheezing, rhonchi or rales Abd: soft, nontender, no hepatomegaly, + BS MS: no deformity or atrophy Ext: 1+ BLE edema Skin: warm and dry, no rash Neuro:  moves all extremities spontaneously, no focal abnormalities noted, follows commands Psych: euthymic mood, full affect   EKG:  Done today shows atypical atrial flutter 136bpm, rightward axis, nonspecific ST-T changes  Recent Labs: 05/11/2015: Magnesium 2.4 05/12/2015: ALT 16; B Natriuretic Peptide 299.5* 05/13/2015: BUN 26*; Creatinine, Ser 0.88; Hemoglobin 13.5; Platelets 263; Potassium 4.0; Sodium 137 05/14/2015: TSH 2.235  No results found for requested labs within last 365 days.   CrCl cannot be calculated (Patient has no serum creatinine result on file.).   Wt Readings from Last 3 Encounters:  09/13/15 177 lb 12.8 oz (80.65 kg)  07/18/15 164 lb (74.39 kg)  05/21/15 158 lb (71.668 kg)     Other studies reviewed: Additional studies/records reviewed today include: summarized above  ASSESSMENT AND PLAN:  1. Rapid atrial flutter - new diagnosis (but with known history of PAF/VT). Reviewed case and EKG with Dr. Rayann Heman. Suspect clinical presentation has been precipitated by noncompliance with prior recommended medication regimen - patient previously discontinued amiodarone and metoprolol due to nausea and dizziness. Per d/w Dr. Rayann Heman, will add back amiodarone 200mg  BID which the patient is agreeable to. She is also agreeable to trying a different beta blocker - will try bisoprolol 5mg  daily. Continue anticoagulation. Will have her seen back in early follow-up in several days. If she does not convert on her own, may need to consider earlier cardioversion versus attempt at pace termination with amiodarone back on board. Plan was discussed with Dr. Lovena Le as well who agrees. 2. Acute on chronic diastolic CHF - titrate Lasix to 40mg  BID over the weekend. Will have her seen in early follow-up on Monday to assess if  we can go back down to once daily. Check labs to assure stability of lytes & thyroid function. 3. Paroxysmal atrial fibrillation - see above. Continue anticoagulation. 4. Paroxysmal VT - see above regarding resumption of amiodarone. No shocks indicated by interrogation. Follow for recurrence. 5. CAD in native artery - not on ASA due to being on Xarelto. Once she is over this acute episode, she will need to go back to see CT surgery to weigh in on timing of possible CABG. 6. Hypertrophic cardiomyopathy - follow symptoms with above med changes. 7. Abnormal PFTs - once improved from CHF standpoint, would benefit from pulm eval with repeat PFTs. .  Disposition: Early f/u with APP on Monday 09/17/15. ER precautions reviewed with patient.  Current  medicines are reviewed at length with the patient today.  The patient did not have any concerns regarding medicines.  Raechel Ache PA-C 09/13/2015 11:01 AM     [facility] [address] [place] [address] [phone] (office)  [phone] (fax)

## 2015-09-12 NOTE — Telephone Encounter (Signed)
Pt having SOB, fluid build up in abdomen, legs and feet, heart racing-started about 5 days ago-pt took Lasix and not helping-pls advise pt [phone]

## 2015-09-13 ENCOUNTER — Ambulatory Visit (INDEPENDENT_AMBULATORY_CARE_PROVIDER_SITE_OTHER): Payer: PPO | Admitting: Physician Assistant

## 2015-09-13 ENCOUNTER — Encounter: Payer: Self-pay | Admitting: Physician Assistant

## 2015-09-13 ENCOUNTER — Ambulatory Visit (INDEPENDENT_AMBULATORY_CARE_PROVIDER_SITE_OTHER): Payer: PPO | Admitting: *Deleted

## 2015-09-13 ENCOUNTER — Encounter: Payer: Self-pay | Admitting: Internal Medicine

## 2015-09-13 VITALS — BP 140/80 | HR 132 | Ht 64.0 in | Wt 177.8 lb

## 2015-09-13 DIAGNOSIS — I2510 Atherosclerotic heart disease of native coronary artery without angina pectoris: Secondary | ICD-10-CM | POA: Diagnosis not present

## 2015-09-13 DIAGNOSIS — I5033 Acute on chronic diastolic (congestive) heart failure: Secondary | ICD-10-CM

## 2015-09-13 DIAGNOSIS — I4729 Other ventricular tachycardia: Secondary | ICD-10-CM

## 2015-09-13 DIAGNOSIS — I422 Other hypertrophic cardiomyopathy: Secondary | ICD-10-CM | POA: Diagnosis not present

## 2015-09-13 DIAGNOSIS — I4892 Unspecified atrial flutter: Secondary | ICD-10-CM | POA: Diagnosis not present

## 2015-09-13 DIAGNOSIS — I480 Paroxysmal atrial fibrillation: Secondary | ICD-10-CM | POA: Diagnosis not present

## 2015-09-13 DIAGNOSIS — I472 Ventricular tachycardia, unspecified: Secondary | ICD-10-CM

## 2015-09-13 DIAGNOSIS — R942 Abnormal results of pulmonary function studies: Secondary | ICD-10-CM

## 2015-09-13 LAB — CUP PACEART INCLINIC DEVICE CHECK
Battery Remaining Longevity: 88.8
HighPow Impedance: 51.75 Ohm
Implantable Lead Implant Date: 20160912
Implantable Lead Location: 753859
Implantable Lead Location: 753860
Lead Channel Impedance Value: 437.5 Ohm
Lead Channel Impedance Value: 437.5 Ohm
Lead Channel Sensing Intrinsic Amplitude: 3.2 mV
Lead Channel Setting Pacing Pulse Width: 0.5 ms
MDC IDC LEAD IMPLANT DT: 20160912
MDC IDC LEAD MODEL: 7122
MDC IDC MSMT LEADCHNL RV SENSING INTR AMPL: 12 mV
MDC IDC PG SERIAL: 7279969
MDC IDC SESS DTM: 20170209160855
MDC IDC SET LEADCHNL RA PACING AMPLITUDE: 3.5 V
MDC IDC SET LEADCHNL RV PACING AMPLITUDE: 3.5 V
MDC IDC SET LEADCHNL RV SENSING SENSITIVITY: 0.5 mV
MDC IDC STAT BRADY RA PERCENT PACED: 10 %
MDC IDC STAT BRADY RV PERCENT PACED: 0.48 %

## 2015-09-13 LAB — COMPREHENSIVE METABOLIC PANEL
ALK PHOS: 112 U/L (ref 33–130)
ALT: 13 U/L (ref 6–29)
AST: 16 U/L (ref 10–35)
Albumin: 3.7 g/dL (ref 3.6–5.1)
BUN: 24 mg/dL (ref 7–25)
CALCIUM: 9 mg/dL (ref 8.6–10.4)
CO2: 24 mmol/L (ref 20–31)
Chloride: 109 mmol/L (ref 98–110)
Creat: 0.82 mg/dL (ref 0.50–0.99)
Glucose, Bld: 87 mg/dL (ref 65–99)
POTASSIUM: 4.1 mmol/L (ref 3.5–5.3)
Sodium: 143 mmol/L (ref 135–146)
TOTAL PROTEIN: 6.4 g/dL (ref 6.1–8.1)
Total Bilirubin: 1 mg/dL (ref 0.2–1.2)

## 2015-09-13 LAB — TSH: TSH: 2.27 m[IU]/L

## 2015-09-13 LAB — CBC
HCT: 35 % — ABNORMAL LOW (ref 36.0–46.0)
Hemoglobin: 10.9 g/dL — ABNORMAL LOW (ref 12.0–15.0)
MCH: 27.7 pg (ref 26.0–34.0)
MCHC: 31.1 g/dL (ref 30.0–36.0)
MCV: 89.1 fL (ref 78.0–100.0)
MPV: 9.6 fL (ref 8.6–12.4)
Platelets: 355 10*3/uL (ref 150–400)
RBC: 3.93 MIL/uL (ref 3.87–5.11)
RDW: 16.2 % — ABNORMAL HIGH (ref 11.5–15.5)
WBC: 7.3 10*3/uL (ref 4.0–10.5)

## 2015-09-13 LAB — MAGNESIUM: MAGNESIUM: 2 mg/dL (ref 1.5–2.5)

## 2015-09-13 MED ORDER — AMIODARONE HCL 200 MG PO TABS: 200.0000 mg | ORAL_TABLET | Freq: Two times a day (BID) | ORAL | Status: DC

## 2015-09-13 MED ORDER — BISOPROLOL FUMARATE 5 MG PO TABS: 5.0000 mg | ORAL_TABLET | Freq: Every day | ORAL | Status: DC

## 2015-09-13 MED ORDER — FUROSEMIDE 40 MG PO TABS: 40.0000 mg | ORAL_TABLET | Freq: Two times a day (BID) | ORAL | Status: DC

## 2015-09-13 NOTE — Addendum Note (Signed)
Addended by: Loren Racer on: 09/13/2015 11:44 AM   Modules accepted: Orders

## 2015-09-13 NOTE — Progress Notes (Signed)
Add-on ICD check during appointment with Melina Copa, PA. Normal device function. Sensing consistent with previous device measurements. Impedance trends stable over time. Unable to perform threshold testing due to high rates. 32 "non-sustained" episodes, available EGMs suggest AF w/RVR, irregular R-R intervals. 707 AMS episodes (17%), +Xarelto. Histogram distribution appropriate for patient and level of activity. CorVue unstable, Melina Copa, PA aware. No changes made this session, outputs maintained at 3.5V due to inability to test thresholds. Device programmed at appropriate safety margins. Device programmed to optimize intrinsic conduction. Estimated longevity 4.5-7.4 years. Pt enrolled in remote follow-up. Patient education completed including shock plan. ROV with GT on 09/26/15.

## 2015-09-13 NOTE — Patient Instructions (Signed)
Medication Instructions:  1) START Amiodarone 200mg  twice daily 2) START Bisoprolol 5mg  once daily 3) INCREASE Lasix to 40mg  twice daily until you are seen back on Monday  Labwork: CMET, MAGNESIUM, CBC, TSH, and BNP today  Testing/Procedures: None  Follow-Up: Your physician recommends that you schedule a follow-up appointment on Monday, February 13 at 11:30AM with Truitt Merle.   Any Other Special Instructions Will Be Listed Below (If Applicable).     If you need a refill on your cardiac medications before your next appointment, please call your pharmacy.

## 2015-09-14 ENCOUNTER — Telehealth: Payer: Self-pay | Admitting: Physician Assistant

## 2015-09-14 LAB — BRAIN NATRIURETIC PEPTIDE: BRAIN NATRIURETIC PEPTIDE: 398.6 pg/mL — AB (ref <100)

## 2015-09-14 NOTE — Telephone Encounter (Signed)
Spoke with pt and informed her of lab results. Advised pt we would call back in regards to BNP if Dayna made any changes based off of results. Pt agreeable to call PCP in regards to Hgb.  Pt denies any BRBPR, melena or other sources of blood loss. Sent labs over to PCP.

## 2015-09-14 NOTE — Telephone Encounter (Signed)
New message ° ° ° ° °Returning a call to the nurse to get lab results °

## 2015-09-17 ENCOUNTER — Encounter: Payer: Self-pay | Admitting: Nurse Practitioner

## 2015-09-17 ENCOUNTER — Encounter (HOSPITAL_COMMUNITY): Payer: Self-pay | Admitting: Emergency Medicine

## 2015-09-17 ENCOUNTER — Ambulatory Visit (INDEPENDENT_AMBULATORY_CARE_PROVIDER_SITE_OTHER): Payer: PPO | Admitting: Nurse Practitioner

## 2015-09-17 ENCOUNTER — Other Ambulatory Visit: Payer: Self-pay | Admitting: Nurse Practitioner

## 2015-09-17 ENCOUNTER — Emergency Department (HOSPITAL_COMMUNITY): Payer: PPO

## 2015-09-17 ENCOUNTER — Inpatient Hospital Stay (HOSPITAL_COMMUNITY)
Admission: EM | Admit: 2015-09-17 | Discharge: 2015-09-24 | DRG: 308 | Disposition: A | Discharge status: Home / Self Care | Payer: PPO | Attending: Internal Medicine | Admitting: Internal Medicine

## 2015-09-17 VITALS — BP 120/84 | HR 71 | Ht 64.0 in | Wt 181.2 lb

## 2015-09-17 DIAGNOSIS — B952 Enterococcus as the cause of diseases classified elsewhere: Secondary | ICD-10-CM | POA: Diagnosis not present

## 2015-09-17 DIAGNOSIS — I484 Atypical atrial flutter: Secondary | ICD-10-CM | POA: Diagnosis not present

## 2015-09-17 DIAGNOSIS — I340 Nonrheumatic mitral (valve) insufficiency: Secondary | ICD-10-CM | POA: Diagnosis not present

## 2015-09-17 DIAGNOSIS — Z7901 Long term (current) use of anticoagulants: Secondary | ICD-10-CM | POA: Diagnosis not present

## 2015-09-17 DIAGNOSIS — I422 Other hypertrophic cardiomyopathy: Secondary | ICD-10-CM | POA: Diagnosis not present

## 2015-09-17 DIAGNOSIS — Z7984 Long term (current) use of oral hypoglycemic drugs: Secondary | ICD-10-CM | POA: Diagnosis not present

## 2015-09-17 DIAGNOSIS — R0602 Shortness of breath: Secondary | ICD-10-CM | POA: Diagnosis not present

## 2015-09-17 DIAGNOSIS — I472 Ventricular tachycardia, unspecified: Secondary | ICD-10-CM

## 2015-09-17 DIAGNOSIS — Z9111 Patient's noncompliance with dietary regimen: Secondary | ICD-10-CM

## 2015-09-17 DIAGNOSIS — I5021 Acute systolic (congestive) heart failure: Secondary | ICD-10-CM | POA: Diagnosis not present

## 2015-09-17 DIAGNOSIS — I255 Ischemic cardiomyopathy: Secondary | ICD-10-CM | POA: Diagnosis present

## 2015-09-17 DIAGNOSIS — N342 Other urethritis: Secondary | ICD-10-CM | POA: Diagnosis not present

## 2015-09-17 DIAGNOSIS — I4891 Unspecified atrial fibrillation: Secondary | ICD-10-CM | POA: Diagnosis not present

## 2015-09-17 DIAGNOSIS — N390 Urinary tract infection, site not specified: Secondary | ICD-10-CM | POA: Diagnosis not present

## 2015-09-17 DIAGNOSIS — E785 Hyperlipidemia, unspecified: Secondary | ICD-10-CM | POA: Diagnosis not present

## 2015-09-17 DIAGNOSIS — Z87891 Personal history of nicotine dependence: Secondary | ICD-10-CM | POA: Diagnosis not present

## 2015-09-17 DIAGNOSIS — I110 Hypertensive heart disease with heart failure: Secondary | ICD-10-CM | POA: Diagnosis not present

## 2015-09-17 DIAGNOSIS — Z8249 Family history of ischemic heart disease and other diseases of the circulatory system: Secondary | ICD-10-CM

## 2015-09-17 DIAGNOSIS — I480 Paroxysmal atrial fibrillation: Secondary | ICD-10-CM | POA: Diagnosis present

## 2015-09-17 DIAGNOSIS — Z823 Family history of stroke: Secondary | ICD-10-CM

## 2015-09-17 DIAGNOSIS — I5033 Acute on chronic diastolic (congestive) heart failure: Secondary | ICD-10-CM

## 2015-09-17 DIAGNOSIS — R0902 Hypoxemia: Secondary | ICD-10-CM | POA: Diagnosis not present

## 2015-09-17 DIAGNOSIS — I5031 Acute diastolic (congestive) heart failure: Secondary | ICD-10-CM | POA: Diagnosis present

## 2015-09-17 DIAGNOSIS — I2510 Atherosclerotic heart disease of native coronary artery without angina pectoris: Secondary | ICD-10-CM | POA: Diagnosis present

## 2015-09-17 DIAGNOSIS — I5032 Chronic diastolic (congestive) heart failure: Secondary | ICD-10-CM | POA: Diagnosis present

## 2015-09-17 DIAGNOSIS — I4892 Unspecified atrial flutter: Secondary | ICD-10-CM | POA: Diagnosis not present

## 2015-09-17 DIAGNOSIS — E119 Type 2 diabetes mellitus without complications: Secondary | ICD-10-CM | POA: Diagnosis not present

## 2015-09-17 DIAGNOSIS — E039 Hypothyroidism, unspecified: Secondary | ICD-10-CM | POA: Diagnosis present

## 2015-09-17 DIAGNOSIS — I509 Heart failure, unspecified: Secondary | ICD-10-CM

## 2015-09-17 DIAGNOSIS — I361 Nonrheumatic tricuspid (valve) insufficiency: Secondary | ICD-10-CM | POA: Diagnosis not present

## 2015-09-17 DIAGNOSIS — I5023 Acute on chronic systolic (congestive) heart failure: Secondary | ICD-10-CM | POA: Diagnosis not present

## 2015-09-17 DIAGNOSIS — R9341 Abnormal radiologic findings on diagnostic imaging of renal pelvis, ureter, or bladder: Secondary | ICD-10-CM | POA: Diagnosis not present

## 2015-09-17 DIAGNOSIS — R0600 Dyspnea, unspecified: Secondary | ICD-10-CM

## 2015-09-17 DIAGNOSIS — J385 Laryngeal spasm: Secondary | ICD-10-CM | POA: Diagnosis not present

## 2015-09-17 DIAGNOSIS — I483 Typical atrial flutter: Secondary | ICD-10-CM | POA: Diagnosis not present

## 2015-09-17 DIAGNOSIS — Z95810 Presence of automatic (implantable) cardiac defibrillator: Secondary | ICD-10-CM

## 2015-09-17 DIAGNOSIS — I252 Old myocardial infarction: Secondary | ICD-10-CM | POA: Diagnosis not present

## 2015-09-17 HISTORY — DX: Reserved for inherently not codable concepts without codable children: IMO0001

## 2015-09-17 LAB — I-STAT TROPONIN, ED: Troponin i, poc: 0.03 ng/mL (ref 0.00–0.08)

## 2015-09-17 LAB — BASIC METABOLIC PANEL
ANION GAP: 12 (ref 5–15)
BUN: 40 mg/dL — ABNORMAL HIGH (ref 6–20)
CALCIUM: 9 mg/dL (ref 8.9–10.3)
CHLORIDE: 108 mmol/L (ref 101–111)
CO2: 22 mmol/L (ref 22–32)
Creatinine, Ser: 1.15 mg/dL — ABNORMAL HIGH (ref 0.44–1.00)
GFR calc non Af Amer: 48 mL/min — ABNORMAL LOW (ref >60)
GFR, EST AFRICAN AMERICAN: 56 mL/min — AB (ref >60)
GLUCOSE: 86 mg/dL (ref 65–99)
Potassium: 4.1 mmol/L (ref 3.5–5.1)
Sodium: 142 mmol/L (ref 135–145)

## 2015-09-17 LAB — TROPONIN I
Troponin I: 0.06 ng/mL — ABNORMAL HIGH (ref <0.031)
Troponin I: 0.06 ng/mL — ABNORMAL HIGH (ref <0.031)

## 2015-09-17 LAB — BRAIN NATRIURETIC PEPTIDE
B Natriuretic Peptide: 654.9 pg/mL — ABNORMAL HIGH (ref 0.0–100.0)
B Natriuretic Peptide: 784.3 pg/mL — ABNORMAL HIGH (ref 0.0–100.0)

## 2015-09-17 LAB — GLUCOSE, CAPILLARY: Glucose-Capillary: 72 mg/dL (ref 65–99)

## 2015-09-17 LAB — COMPREHENSIVE METABOLIC PANEL
ALT: 17 U/L (ref 14–54)
AST: 18 U/L (ref 15–41)
Albumin: 3.9 g/dL (ref 3.5–5.0)
Alkaline Phosphatase: 123 U/L (ref 38–126)
Anion gap: 12 (ref 5–15)
BUN: 37 mg/dL — ABNORMAL HIGH (ref 6–20)
CO2: 23 mmol/L (ref 22–32)
Calcium: 9.4 mg/dL (ref 8.9–10.3)
Chloride: 108 mmol/L (ref 101–111)
Creatinine, Ser: 1.13 mg/dL — ABNORMAL HIGH (ref 0.44–1.00)
GFR calc Af Amer: 57 mL/min — ABNORMAL LOW (ref >60)
GFR calc non Af Amer: 49 mL/min — ABNORMAL LOW (ref >60)
Glucose, Bld: 92 mg/dL (ref 65–99)
Potassium: 3.9 mmol/L (ref 3.5–5.1)
Sodium: 143 mmol/L (ref 135–145)
Total Bilirubin: 0.9 mg/dL (ref 0.3–1.2)
Total Protein: 7 g/dL (ref 6.5–8.1)

## 2015-09-17 LAB — CBC WITH DIFFERENTIAL/PLATELET
Basophils Absolute: 0.1 10*3/uL (ref 0.0–0.1)
Basophils Relative: 1 %
Eosinophils Absolute: 0.3 10*3/uL (ref 0.0–0.7)
Eosinophils Relative: 3 %
HCT: 37.8 % (ref 36.0–46.0)
Hemoglobin: 11.4 g/dL — ABNORMAL LOW (ref 12.0–15.0)
Lymphocytes Relative: 17 %
Lymphs Abs: 1.7 10*3/uL (ref 0.7–4.0)
MCH: 27.1 pg (ref 26.0–34.0)
MCHC: 30.2 g/dL (ref 30.0–36.0)
MCV: 90 fL (ref 78.0–100.0)
Monocytes Absolute: 0.6 10*3/uL (ref 0.1–1.0)
Monocytes Relative: 6 %
Neutro Abs: 7.4 10*3/uL (ref 1.7–7.7)
Neutrophils Relative %: 73 %
Platelets: 362 10*3/uL (ref 150–400)
RBC: 4.2 MIL/uL (ref 3.87–5.11)
RDW: 16.2 % — ABNORMAL HIGH (ref 11.5–15.5)
WBC: 10 10*3/uL (ref 4.0–10.5)

## 2015-09-17 LAB — CBC
HCT: 36.5 % (ref 36.0–46.0)
Hemoglobin: 11.2 g/dL — ABNORMAL LOW (ref 12.0–15.0)
MCH: 27.9 pg (ref 26.0–34.0)
MCHC: 30.7 g/dL (ref 30.0–36.0)
MCV: 91 fL (ref 78.0–100.0)
Platelets: 323 10*3/uL (ref 150–400)
RBC: 4.01 MIL/uL (ref 3.87–5.11)
RDW: 16.3 % — ABNORMAL HIGH (ref 11.5–15.5)
WBC: 8.4 10*3/uL (ref 4.0–10.5)

## 2015-09-17 LAB — APTT: aPTT: 45 seconds — ABNORMAL HIGH (ref 24–37)

## 2015-09-17 LAB — PROTIME-INR
INR: 2.59 — ABNORMAL HIGH (ref 0.00–1.49)
Prothrombin Time: 27.4 seconds — ABNORMAL HIGH (ref 11.6–15.2)

## 2015-09-17 LAB — MAGNESIUM: Magnesium: 2 mg/dL (ref 1.7–2.4)

## 2015-09-17 MED ORDER — SODIUM CHLORIDE 0.9% FLUSH: 3.0000 mL | INTRAVENOUS | Status: DC | PRN

## 2015-09-17 MED ORDER — GLIPIZIDE 5 MG PO TABS
5.0000 mg | ORAL_TABLET | Freq: Every day | ORAL | Status: DC
  Filled 2015-09-17 (×2): qty 1

## 2015-09-17 MED ORDER — METFORMIN HCL ER 500 MG PO TB24: 500.0000 mg | ORAL_TABLET | Freq: Every day | ORAL | Status: DC

## 2015-09-17 MED ORDER — ATORVASTATIN CALCIUM 40 MG PO TABS: 40.0000 mg | ORAL_TABLET | Freq: Every day | ORAL | Status: DC

## 2015-09-17 MED ORDER — RIVAROXABAN 20 MG PO TABS: 20.0000 mg | ORAL_TABLET | Freq: Every day | ORAL | Status: DC

## 2015-09-17 MED ORDER — ACETAMINOPHEN 325 MG PO TABS
650.0000 mg | ORAL_TABLET | ORAL | Status: DC | PRN
  Administered 2015-09-24: 650 mg via ORAL
  Filled 2015-09-17: qty 2

## 2015-09-17 MED ORDER — GLIPIZIDE 5 MG PO TABS
5.0000 mg | ORAL_TABLET | Freq: Every day | ORAL | Status: DC
  Administered 2015-09-18 – 2015-09-21 (×4): 5 mg via ORAL
  Filled 2015-09-17 (×6): qty 1

## 2015-09-17 MED ORDER — SODIUM CHLORIDE 0.9 % IV SOLN: 250.0000 mL | INTRAVENOUS | Status: DC | PRN

## 2015-09-17 MED ORDER — RIVAROXABAN 20 MG PO TABS
20.0000 mg | ORAL_TABLET | Freq: Every day | ORAL | Status: DC
  Administered 2015-09-18 – 2015-09-23 (×7): 20 mg via ORAL
  Filled 2015-09-17 (×7): qty 1

## 2015-09-17 MED ORDER — FUROSEMIDE 10 MG/ML IJ SOLN
40.0000 mg | Freq: Once | INTRAMUSCULAR | Status: AC
  Administered 2015-09-17: 40 mg via INTRAVENOUS
  Filled 2015-09-17: qty 4

## 2015-09-17 MED ORDER — METFORMIN HCL ER 500 MG PO TB24
500.0000 mg | ORAL_TABLET | Freq: Every day | ORAL | Status: DC
  Administered 2015-09-18 – 2015-09-21 (×5): 500 mg via ORAL
  Filled 2015-09-17 (×4): qty 1

## 2015-09-17 MED ORDER — AMIODARONE HCL 200 MG PO TABS
200.0000 mg | ORAL_TABLET | Freq: Two times a day (BID) | ORAL | Status: DC
  Administered 2015-09-18 – 2015-09-24 (×13): 200 mg via ORAL
  Filled 2015-09-17 (×13): qty 1

## 2015-09-17 MED ORDER — LEVOTHYROXINE SODIUM 50 MCG PO TABS
50.0000 ug | ORAL_TABLET | Freq: Every day | ORAL | Status: DC
  Administered 2015-09-18 – 2015-09-24 (×7): 50 ug via ORAL
  Filled 2015-09-17 (×7): qty 1

## 2015-09-17 MED ORDER — GLIPIZIDE 5 MG PO TABS: 5.0000 mg | ORAL_TABLET | Freq: Every day | ORAL | Status: DC

## 2015-09-17 MED ORDER — DILTIAZEM HCL ER COATED BEADS 120 MG PO CP24
120.0000 mg | ORAL_CAPSULE | Freq: Every day | ORAL | Status: DC
  Administered 2015-09-18 – 2015-09-22 (×5): 120 mg via ORAL
  Filled 2015-09-17 (×5): qty 1

## 2015-09-17 MED ORDER — METFORMIN HCL ER 500 MG PO TB24
500.0000 mg | ORAL_TABLET | Freq: Every day | ORAL | Status: DC
  Filled 2015-09-17: qty 1

## 2015-09-17 MED ORDER — ATORVASTATIN CALCIUM 40 MG PO TABS
40.0000 mg | ORAL_TABLET | Freq: Every day | ORAL | Status: DC
  Administered 2015-09-18 – 2015-09-23 (×7): 40 mg via ORAL
  Filled 2015-09-17 (×7): qty 1

## 2015-09-17 MED ORDER — FUROSEMIDE 10 MG/ML IJ SOLN
60.0000 mg | Freq: Two times a day (BID) | INTRAMUSCULAR | Status: DC
  Administered 2015-09-18 – 2015-09-21 (×7): 60 mg via INTRAVENOUS
  Filled 2015-09-17 (×7): qty 6

## 2015-09-17 MED ORDER — ONDANSETRON HCL 4 MG/2ML IJ SOLN: 4.0000 mg | Freq: Four times a day (QID) | INTRAMUSCULAR | Status: DC | PRN

## 2015-09-17 MED ORDER — SODIUM CHLORIDE 0.9% FLUSH
3.0000 mL | Freq: Two times a day (BID) | INTRAVENOUS | Status: DC
  Administered 2015-09-18 – 2015-09-24 (×12): 3 mL via INTRAVENOUS

## 2015-09-17 MED ORDER — BISOPROLOL FUMARATE 5 MG PO TABS
5.0000 mg | ORAL_TABLET | Freq: Every day | ORAL | Status: DC
  Administered 2015-09-18 – 2015-09-24 (×7): 5 mg via ORAL
  Filled 2015-09-17 (×7): qty 1

## 2015-09-17 NOTE — ED Notes (Signed)
Pt was sent from here pcp for a abnormal EKG. EKg shows ventricular paced rate of 69. Pt denies any chest pain. Pt does have sob and increase swelling in both legs.

## 2015-09-17 NOTE — Progress Notes (Signed)
CARDIOLOGY OFFICE NOTE  Date:  09/17/2015    [name] Date of Birth: Feb 08, 1946 Medical Record [id number]  PCP:  Lujean Amel, MD  Cardiologist:  Mare Ferrari    Chief Complaint  Patient presents with  . Atrial Fibrillation  . Atrial Flutter    4 day check - seen for Dr. Lovena Le    History of Present Illness: [name] is a 70 y.o. female who presents today for a 4 day check. Seen for Dr. Mare Ferrari.   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 Jude ICD 04/2015, & chronic diastolic CHF (EF 0000000 by echo, 45-50% by cath).   Per review of complex history, she had admission in 03/2015 for urosepsis, near syncope and AF RVR. (Prior to that admission she had previously been on amiodarone and Xarelto in 2014 but had self-d/c'd meds.) Troponins peaked at 0.24 and initially outpatient stress test was recommended. She was treated for CHF. Given LA enlargement, rate control strategy was chosen and anticoagulation was re-initiated. She was readmitted 9/2-04/13/15 with symptomatic paroxysmal ventricular tachycardia. Despite magnesium and amiodarone she continued to have sustained runs and required initiation of lidocaine along with calcium gluconate and additional magnesium. She eventually was switched to oral amiodarone and underwent successful ICD implantation with recommendation for no driving x 6 months. Coreg was changed to metoprolol. She was diuresed for a/c CHF. She underwent coronary CT suspicious for CAD and subsequently underwent cardiac cath showing 3V CAD, LVEDP 24, LVEF 45-50%. Her PFTs showed severely decreased FVC and FEV1, although her pleural effusions could be contributing to that issue and it was felt worth having pulmonary evaluate her at some point. Cardiac surgery evaluated the patient and recommended  medical therapy initially and then revaluate for CABG as outpatient in 2-3 months. She was re-admitted 05/2015 for syncope felt secondary to orthostasis. Interrogation showed no VT and no atrial fib. During that admission she had refused to take any medications other than Synthroid. Most notably she declined to take further metoprolol or amiodarone. 2D echo 05/12/15: EF 60-65%, asymmetric septal hypertrophy c/w hypertrophic cardiomyopathy, no SAM or dynamic obstructive gradient at rest, no RWMA, grade 1 DD, mild AI, mild MR, severely dilated LA. She had not been seen in the office since September. She has cancelled all f/u appointments with Dr. Mare Ferrari on 10/26, Cecilie Kicks on 10/26, Dr. Lovena Le on 12/13, 12/16, 1/17, and 1/26. She has not been back to see CT surgery.  Seen here last week as a work in by [employer], [place] for progressive dyspnea with exertion, palpitations, orthopnea, LEE, and weight gain. Weight was up 13lbs from December. She reported compliance with her Xarelto. She was in rapid atypical atrial flutter today with a HR of 136. Device interrogation confirmed she had been out of rhythm at least since 09/03/15. The interrogation did show gradually increasing high rate burden as of early January but unfortunately did not give more specific information about the number of discrete episodes since that time.   After discussion with Dr. Rayann Heman and Dr. Lovena Le - amiodarone was restarted along with low dose Bisoprolol. Close follow up was recommended.   Comes back today. Here with her grandson [name]. She continues to do poorly. Weight continues to climb. Up 4 more pounds. No response with the extra lasxi. She is more bloated. Early satiety. Still with PND/orthopnea.  She says she is taking her medicines - this includes the amiodarone, zebeta, and higher doses of Lasix. Most likely getting too much salt - cooks foods out of cans/boxes and frozen meals. No chest pain reported. No syncope. She does feel  like her heart is more "regular" since getting back on amiodarone. Tells me that the only way she "can get the fluid off is with IV lasix".   She tells me her prior missed appointments were due to upper respiratory illness.   Past Medical History  Diagnosis Date  . Diabetes mellitus without complication ([place])     dx on wed  . Paroxysmal atrial fibrillation ([name])   . Hypertrophic cardiomyopathy ([place])   . Left atrial enlargement   . Tricuspid regurgitation     a. Echo 04/2015: Mod-severe TR. b. Not mentioned on echo 05/2015  . Ventricular tachycardia ([place])     a. s/p St. Jude ICD implanted 04/2015.  [name] CAD (coronary artery disease), native coronary artery - 3 vessel 04/21/2015    a. NSTEMI 8-04/2015 felt 2/2 demand ischemia. b. 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  . Abnormal PFT   . Chronic diastolic CHF (congestive heart failure) ([place])   . Mitral regurgitation     a. Echo 04/2015: moderate mitral regurgitation. b. F/u echo 05/2015: mild MR.  . Diabetes mellitus [facility])     Past Surgical History  Procedure Laterality Date  . Dental surgery  Oct. 2011    several extractions, bone graft  . I&d extremity Left 01/06/2013    Procedure: IRRIGATION AND DEBRIDEMENT LEFT ELBOW AND LEFT FOREARM ;  Surgeon: [name], MD;  Location: [facility];  Service: Orthopedics;  Laterality: Left;  . Application of wound vac Left 01/06/2013    Procedure: APPLICATION OF WOUND VAC X 2;  Surgeon: [name], MD;  Location: [facility];  Service: Orthopedics;  Laterality: Left;  left forearm  . Incision and drainage Left 01/09/2013    Procedure: REDO INCISION AND DRAINAGE LEFT ELBOW;  Surgeon: [name], MD;  Location: [facility];  Service: Orthopedics;  Laterality: Left;  SUPINE, UPPER EXTERMITY DRAPE  . Secondary closure of wound Left 01/09/2013    Procedure: SECONDARY CLOSURE OF WOUND  LEFT ELBOW;  Surgeon: [name], MD;  Location: [facility];  Service:  Orthopedics;  Laterality: Left;  . Application of wound vac Left 01/09/2013    Procedure: APPLICATION OF WOUND VAC;  Surgeon: [name], MD;  Location: [facility];  Service: Orthopedics;  Laterality: Left;  . Ep implantable device N/A 04/16/2015    Procedure: ICD Implant;  Surgeon: [name], MD;  Location: [facility];  Service: Cardiovascular;  Laterality: N/A;  . Cardiac catheterization N/A 04/19/2015    Procedure: Left Heart Cath and Coronary Angiography;  Surgeon: [name], MD;  Location: [facility];  Service: Cardiovascular;  Laterality: N/A;     Medications: Current Outpatient Prescriptions  Medication Sig Dispense Refill  . amiodarone (PACERONE) 200 MG tablet Take 1 tablet (200 mg total) by mouth 2 (two) times daily. 60 tablet 11  . atorvastatin (LIPITOR) 40 MG tablet Take 1 tablet (40 mg total) by mouth daily at 6 PM. 30 tablet 3  . bisoprolol (ZEBETA) 5 MG tablet Take 1 tablet (5 mg total) by mouth daily. 30 tablet 11  . Blood Glucose Monitoring Suppl (TRUE METRIX METER) DEVI 1 each by Does not apply  route 2 (two) times daily. 1 Device 0  . diltiazem (CARDIZEM CD) 120 MG 24 hr capsule Take 1 capsule (120 mg total) by mouth daily. 30 capsule 1  . furosemide (LASIX) 40 MG tablet Take 1 tablet (40 mg total) by mouth 2 (two) times daily. 60 tablet 0  . glipiZIDE (GLUCOTROL) 5 MG tablet Take 5 mg by mouth daily.    [name] glucose blood (TRUE METRIX BLOOD GLUCOSE TEST) test strip Use as instructed twice daily 100 each 12  . levothyroxine (SYNTHROID, LEVOTHROID) 50 MCG tablet TAKE 1 TABLET BY MOUTH DAILY 30 tablet 2  . metFORMIN (GLUCOPHAGE) 500 MG tablet Take 1 tablet (500 mg total) by mouth daily with breakfast. 60 tablet 3  . rivaroxaban (XARELTO) 20 MG TABS tablet Take 1 tablet (20 mg total) by mouth daily. 90 tablet 3  . TRUEPLUS LANCETS 28G MISC Use as instructed twice daily. 60 each 5   No current facility-administered medications for this visit.     Allergies: No Known Allergies  Social History: The patient  reports that she quit smoking about 34 years ago. She has never used smokeless tobacco. She reports that she does not drink alcohol or use illicit drugs.   Family History: The patient's family history includes Breast cancer in her mother; Diabetes in her father; Healthy in her sister; Heart disease in her sister; Stroke in her father.   Review of Systems: Please see the history of present illness.   Otherwise, the review of systems is positive for none.   All other systems are reviewed and negative.   Physical Exam: VS:  BP 120/84 mmHg  Pulse 71  Ht 5\' 4"  (1.626 m)  Wt 181 lb 3.2 oz (82.192 kg)  BMI 31.09 kg/m2 .  BMI Body mass index is 31.09 kg/(m^2).  Wt Readings from Last 3 Encounters:  09/17/15 181 lb 3.2 oz (82.192 kg)  09/13/15 177 lb 12.8 oz (80.65 kg)  07/18/15 164 lb (74.39 kg)    General: Pleasant and alert but she looks chronically ill but in no acute distress. Color pale.  HEENT: Normal. Neck: Supple, +JVD with just sitting upright.   Cardiac: Regular rate and rhythm. +murmur noted. 2+ edema.  Respiratory:  Lungs are fairly clear to auscultation bilaterally with normal work of breathing at [employer].  GI:  Looks bloated.   MS: No deformity or atrophy. Gait and ROM intact. Skin: Warm and dry. Color is sallow. Neuro:  Strength and sensation are intact and no gross focal deficits noted.  Psych: Alert, appropriate and with normal affect.   LABORATORY DATA:  EKG:  EKG is ordered today. This demonstrates paced rhythm with underlying atrial fib.  Lab Results  Component Value Date   WBC 7.3 09/13/2015   HGB 10.9* 09/13/2015   HCT 35.0* 09/13/2015   PLT 355 09/13/2015   GLUCOSE 87 09/13/2015   CHOL 133 12/31/2012   TRIG 137 12/31/2012   HDL 8* 12/31/2012   LDLCALC 98 12/31/2012   ALT 13 09/13/2015   AST 16 09/13/2015   NA 143 09/13/2015   K 4.1 09/13/2015   CL 109 09/13/2015   CREATININE 0.82  09/13/2015   BUN 24 09/13/2015   CO2 24 09/13/2015   TSH 2.27 09/13/2015   INR 1.28 05/12/2015   HGBA1C 9.0 07/18/2015    BNP (last 3 results)  Recent Labs  03/30/15 2344 04/13/15 1355 05/12/15 0154  BNP 630.5* 740.3* 299.5*    ProBNP (last 3 results) No results for input(s):  PROBNP in the last 8760 hours.   Other Studies Reviewed Today:  Echo Study Conclusions from 05/2015  - Left ventricle: The cavity size was normal. Systolic function was normal. The estimated ejection fraction was in the range of 60% to 65%. Asymmetic septal hypertrophy consistent with hypertrophic cardiomyopathy. There is no SAM or dynamic obstructive gradient at rest. Wall motion was normal; there were no regional wall motion abnormalities. Doppler parameters are consistent with abnormal left ventricular relaxation (grade 1 diastolic dysfunction). - Aortic valve: There was mild regurgitation. Valve area (VTI): 2.48 cm^2. Valve area (Vmax): 2.7 cm^2. - Mitral valve: Mildly to moderately calcified annulus. Mildly thickened leaflets . There was mild regurgitation. - Left atrium: The atrium was severely dilated. - Technically adequate study.  Assessment/Plan: 1. Rapid atrial flutter - new diagnosis (but with known history of PAF/VT). It was suspected that her most recent clinical presentation was precipitated by noncompliance with prior recommended medication regimen - patient previously discontinued amiodarone and metoprolol due to nausea and dizziness. She is now back on amiodarone as well as low dose Zebeta. She remains on her anticoagulation as well - says no missed doses.  Continues with acute diastolic HF - most likely aggravated by her AF and possibly even CAD. Cardioversion may need to be performed.   2. Acute on chronic diastolic CHF - she is failing on outpatient - admitting for IV diuresis. Her AF is aggravating this as well. Discussed with Dr. Rayann Heman here - he is in  agreement - but need to keep in mind long term plan of care. Need to establish compliance going forward.   3. Paroxysmal atrial fibrillation - see above. Continue anticoagulation. May need to consider cardioversion - would defer to EP.   4. Paroxysmal VT - see above regarding resumption of amiodarone. No shocks indicated by interrogation. Follow for recurrence.  5. CAD in native artery - not on ASA due to being on Xarelto. Once she is over this acute episode, she will need to go back to see CT surgery to weigh in on timing of possible CABG.   6. Hypertrophic cardiomyopathy - follow symptoms with above med changes.  7. Abnormal PFTs - once improved from CHF standpoint, would benefit from pulm eval with repeat PFTs.   8. Underlying ICD - not able to check Cor Vue values - looks to have stopped recording - getting Industry to check.    Current medicines are reviewed with the patient today.  The patient does not have concerns regarding medicines other than what has been noted above.  The following changes have been made:  See above.  Labs/ tests ordered today include:    Orders Placed This Encounter  Procedures  . EKG 12-Lead     Disposition:   Further disposition to follow.   Patient is agreeable to this plan and will call if any problems develop in the interim.   Signed: Burtis Junes, RN, ANP-C 09/17/2015 12:41 PM  [facility] [address] [place], [place]  [address] Phone: [phone] Fax: [phone]

## 2015-09-17 NOTE — ED Provider Notes (Signed)
CSN: [id number]     Arrival date & time 09/17/15  1251 History   First MD Initiated Contact with Patient 09/17/15 1653     Chief Complaint  Patient presents with  . Abnormal ECG   HPI Comments: [name] is a 70 y.o. Female that presents to ED for abnormal EKG.  It appears that she was seen at her cardiologist's office today for routine care and was to be directly admitted for IV diuresis for acute on chronic diastolic HF.  She has a h/o Rapid atrial flutter - new diagnosis (but with known history of PAF/VT), on amiodarone and Zebeta and Xarelto.  Cardiology is considering cardioversion +/- CABG when she is medically stable.   Patient reports that she has been have increased SOB from baseline over the last week.  She notes increased LE edema and a weight gain of about 4lbs since Thursday.  She endorses orthopnea.  Denies CP, blurry vision, abdominal pain, nausea or vomiting.  The history is provided by the patient. No language interpreter was used.    Past Medical History  Diagnosis Date  . Diabetes mellitus without complication ([place])     dx on wed  . Paroxysmal atrial fibrillation ([name])   . Hypertrophic cardiomyopathy ([place])   . Left atrial enlargement   . Tricuspid regurgitation     a. Echo 04/2015: Mod-severe TR. b. Not mentioned on echo 05/2015  . Ventricular tachycardia ([place])     a. s/p [employer] ICD implanted 04/2015.  [name] CAD (coronary artery disease), native coronary artery - 3 vessel 04/21/2015    a. NSTEMI 8-04/2015 felt 2/2 demand ischemia. b. 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  . Abnormal PFT   . Chronic diastolic CHF (congestive heart failure) ([place])   . Mitral regurgitation     a. Echo 04/2015: moderate mitral regurgitation. b. F/u echo 05/2015: mild MR.  . Diabetes mellitus [facility])    Past Surgical History  Procedure Laterality Date  . Dental surgery  Oct. 2011    several extractions, bone graft  . I&d extremity  Left 01/06/2013    Procedure: IRRIGATION AND DEBRIDEMENT LEFT ELBOW AND LEFT FOREARM ;  Surgeon: [name], MD;  Location: [facility];  Service: Orthopedics;  Laterality: Left;  . Application of wound vac Left 01/06/2013    Procedure: APPLICATION OF WOUND VAC X 2;  Surgeon: [name], MD;  Location: [facility];  Service: Orthopedics;  Laterality: Left;  left forearm  . Incision and drainage Left 01/09/2013    Procedure: REDO INCISION AND DRAINAGE LEFT ELBOW;  Surgeon: [name], MD;  Location: [facility];  Service: Orthopedics;  Laterality: Left;  SUPINE, UPPER EXTERMITY DRAPE  . Secondary closure of wound Left 01/09/2013    Procedure: SECONDARY CLOSURE OF WOUND  LEFT ELBOW;  Surgeon: [name], MD;  Location: [facility];  Service: Orthopedics;  Laterality: Left;  . Application of wound vac Left 01/09/2013    Procedure: APPLICATION OF WOUND VAC;  Surgeon: [name], MD;  Location: [facility];  Service: Orthopedics;  Laterality: Left;  . Ep implantable device N/A 04/16/2015    Procedure: ICD Implant;  Surgeon: [name], MD;  Location: [place] [facility];  Service: Cardiovascular;  Laterality: N/A;  . Cardiac catheterization N/A 04/19/2015    Procedure: Left Heart Cath and Coronary Angiography;  Surgeon: [name], MD;  Location: [place] [facility];  Service:  Cardiovascular;  Laterality: N/A;   Family History  Problem Relation Age of Onset  . Breast cancer Mother   . Diabetes Father   . Stroke Father   . Heart disease Sister   . Healthy Sister    Social History  Substance Use Topics  . Smoking status: Former Smoker -- 4 years    Quit date: 12/31/1980  . Smokeless tobacco: Never Used  . Alcohol Use: No   OB History    Gravida Para Term Preterm AB TAB SAB Ectopic Multiple Living            1     Review of Systems  Constitutional: Positive for activity change (decreased), fatigue and unexpected weight change (4lb increase).  Respiratory: Positive for shortness of breath.  Negative for chest tightness and wheezing.   Cardiovascular: Positive for palpitations (occasional) and leg swelling. Negative for chest pain.  Gastrointestinal: Negative for nausea, vomiting, abdominal pain and abdominal distention.  Genitourinary: Negative for dysuria, frequency and difficulty urinating.  Musculoskeletal: Negative.   Neurological: Negative for dizziness, weakness and headaches.      Allergies  Review of patient's allergies indicates no known allergies.  Home Medications   Prior to Admission medications   Medication Sig Start Date End Date Taking? Authorizing Provider  amiodarone (PACERONE) 200 MG tablet Take 1 tablet (200 mg total) by mouth 2 (two) times daily. 09/13/15   Dayna N Dunn, PA-C  atorvastatin (LIPITOR) 40 MG tablet Take 1 tablet (40 mg total) by mouth daily at 6 PM. 04/27/15   Arnoldo Morale, MD  bisoprolol (ZEBETA) 5 MG tablet Take 1 tablet (5 mg total) by mouth daily. 09/13/15   Dayna N Dunn, PA-C  Blood Glucose Monitoring Suppl (TRUE METRIX METER) DEVI 1 each by Does not apply route 2 (two) times daily. 04/27/15   Arnoldo Morale, MD  diltiazem (CARDIZEM CD) 120 MG 24 hr capsule Take 1 capsule (120 mg total) by mouth daily. 07/18/15   Arnoldo Morale, MD  furosemide (LASIX) 40 MG tablet Take 1 tablet (40 mg total) by mouth 2 (two) times daily. 09/13/15   Dayna N Dunn, PA-C  glipiZIDE (GLUCOTROL) 5 MG tablet Take 5 mg by mouth daily.    Historical Provider, MD  glucose blood (TRUE METRIX BLOOD GLUCOSE TEST) test strip Use as instructed twice daily 04/27/15   Arnoldo Morale, MD  levothyroxine (SYNTHROID, LEVOTHROID) 50 MCG tablet TAKE 1 TABLET BY MOUTH DAILY 09/07/15   Arnoldo Morale, MD  metFORMIN (GLUCOPHAGE) 500 MG tablet Take 1 tablet (500 mg total) by mouth daily with breakfast. 05/21/15   Arnoldo Morale, MD  rivaroxaban (XARELTO) 20 MG TABS tablet Take 1 tablet (20 mg total) by mouth daily. 05/21/15   Arnoldo Morale, MD  TRUEPLUS LANCETS 28G MISC Use as instructed twice daily.  04/27/15   Arnoldo Morale, MD   BP 106/79 mmHg  Pulse 69  Temp(Src) 98 F (36.7 C) (Oral)  Resp 16  Ht 5\' 4"  (1.626 m)  Wt 82.101 kg  BMI 31.05 kg/m2  SpO2 100% Physical Exam  Constitutional: She is oriented to person, place, and time. She appears well-developed and well-nourished. No distress.  HENT:  Head: Normocephalic and atraumatic.  Mouth/Throat: Oropharynx is clear and moist. No oropharyngeal exudate.  JVP 7-8  Eyes: Conjunctivae and EOM are normal. Pupils are equal, round, and reactive to light. No scleral icterus.  Neck: Normal range of motion. Neck supple.  Cardiovascular: Normal rate.   No murmur heard. Pulmonary/Chest: Effort normal and breath  sounds normal. No respiratory distress. She has no wheezes. She has no rales.  Abdominal: Soft. Bowel sounds are normal. She exhibits no distension. There is no tenderness.  Musculoskeletal: Normal range of motion. She exhibits edema (1-2+ LE edema to mid shin).  Neurological: She is alert and oriented to person, place, and time.  Skin: Skin is warm and dry. No rash noted. She is not diaphoretic.  Psychiatric: She has a normal mood and affect. Her behavior is normal.    ED Course  Procedures (including critical care time) Labs Review Labs Reviewed  BASIC METABOLIC PANEL - Abnormal; Notable for the following:    BUN 40 (*)    Creatinine, Ser 1.15 (*)    GFR calc non Af Amer 48 (*)    GFR calc Af Amer 56 (*)    All other components within normal limits  CBC - Abnormal; Notable for the following:    Hemoglobin 11.2 (*)    RDW 16.3 (*)    All other components within normal limits  TROPONIN I  BRAIN NATRIURETIC PEPTIDE  I-STAT TROPOININ, ED    Imaging Review Dg Chest 2 View  09/17/2015  CLINICAL DATA:  Shortness of breath.  Abnormal EKG.  Leg swelling. EXAM: CHEST  2 VIEW COMPARISON:  04/17/2015 FINDINGS: Chronic cardiopericardial enlargement. Stable appearance of the hila and vascular pedicle. Elevated appearance of the  right diaphragm which is likely sub pulmonic effusion. There is diffuse interstitial coarsening compatible with edema. Patchy opacity at the right base which is likely fissural fluid and atelectasis. Although peripheral, there is no specific triangular morphology typical of infarct. Dual-chamber ICD/pacer leads from the left are in stable position. IMPRESSION: 1. Probable CHF with right sub pulmonic effusion. 2. Asymmetric right basilar opacity is likely atelectasis or fissural fluid. In the appropriate clinical setting pneumonia or infarct could have also have this appearance. Electronically Signed   By: Monte Fantasia M.D.   On: 09/17/2015 14:01   I have personally reviewed and evaluated these images and lab results as part of my medical decision-making.   EKG Interpretation None      MDM   Final diagnoses:  None    1727: CXR with probable CHF.  BMP remarkable for Cr 1.15 (baseline appears to be 0.8)  CBC with hgb 11.2 (appears to be baseline).  EKG paced.  BNP, repeat Trop and EKG ordered.  Will call cards to let them know patient is here.  1733: Discussed patient with Bernerd Pho, PA, from cardiology.  Will defer IV diuresis to cards.  She will see patient.  Janora Norlander, DO 09/17/15 [place], MD 09/18/15 1016

## 2015-09-17 NOTE — H&P (Signed)
[name]  09/17/2015 11:30 AM  Office Visit  MRN:  [id number]   Description: Female DOB: 1945-12-05  Provider: Burtis Junes, NP  Department: [facility]       Vital Signs  Most recent update: 09/17/2015 11:57 AM by Tamsen Snider    BP Pulse Ht Wt BMI    120/84 mmHg 71 5\' 4"  (1.626 m) 181 lb 3.2 oz (82.192 kg) 31.09 kg/m2    Vitals History     Progress Notes      Burtis Junes, NP at 09/17/2015 11:45 AM     Status: Signed       Expand All Collapse All       CARDIOLOGY OFFICE NOTE  Date: 09/17/2015    [name] Date of Birth: 30-Aug-1945 Medical Record [id number]  PCP: Lujean Amel, MD Cardiologist: Mare Ferrari   Chief Complaint  Patient presents with  . Atrial Fibrillation  . Atrial Flutter    4 day check - seen for Dr. Lovena Le    History of Present Illness: [name] is a 70 y.o. female who presents today for a 4 day check. Seen for Dr. Mare Ferrari.   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 Jude ICD 04/2015, & chronic diastolic CHF (EF 0000000 by echo, 45-50% by cath).   Per review of complex history, she had admission in 03/2015 for urosepsis, near syncope and AF RVR. (Prior to that admission she had previously been on amiodarone and Xarelto in 2014 but had self-d/c'd meds.) Troponins peaked at 0.24 and initially outpatient stress test was recommended. She was treated for CHF. Given LA enlargement, rate control strategy was chosen and anticoagulation was re-initiated. She was readmitted 9/2-04/13/15 with symptomatic paroxysmal ventricular tachycardia. Despite magnesium and amiodarone she continued to have sustained runs and required initiation of lidocaine along with calcium gluconate and additional magnesium. She eventually was switched to oral  amiodarone and underwent successful ICD implantation with recommendation for no driving x 6 months. Coreg was changed to metoprolol. She was diuresed for a/c CHF. She underwent coronary CT suspicious for CAD and subsequently underwent cardiac cath showing 3V CAD, LVEDP 24, LVEF 45-50%. Her PFTs showed severely decreased FVC and FEV1, although her pleural effusions could be contributing to that issue and it was felt worth having pulmonary evaluate her at some point. Cardiac surgery evaluated the patient and recommended medical therapy initially and then revaluate for CABG as outpatient in 2-3 months. She was re-admitted 05/2015 for syncope felt secondary to orthostasis. Interrogation showed no VT and no atrial fib. During that admission she had refused to take any medications other than Synthroid. Most notably she declined to take further metoprolol or amiodarone. 2D echo 05/12/15: EF 60-65%, asymmetric septal hypertrophy c/w hypertrophic cardiomyopathy, no SAM or dynamic obstructive gradient at rest, no RWMA, grade 1 DD, mild AI, mild MR, severely dilated LA. She had not been seen in the office since September. She has cancelled all f/u appointments with Dr. Mare Ferrari on 10/26, Cecilie Kicks on 10/26, Dr. Lovena Le on 12/13, 12/16, 1/17, and 1/26. She has not been back to see CT surgery.  Seen here last week as a work in by [employer], [place] for progressive dyspnea with exertion, palpitations, orthopnea, LEE, and weight gain. Weight was up 13lbs from December. She reported compliance with her Xarelto. She was in rapid atypical  atrial flutter today with a HR of 136. Device interrogation confirmed she had been out of rhythm at least since 09/03/15. The interrogation did show gradually increasing high rate burden as of early January but unfortunately did not give more specific information about the number of discrete episodes since that time.   After discussion with Dr. Rayann Heman and Dr. Lovena Le - amiodarone was restarted along  with low dose Bisoprolol. Close follow up was recommended.   Comes back today. Here with her grandson [name]. She continues to do poorly. Weight continues to climb. Up 4 more pounds. No response with the extra lasxi. She is more bloated. Early satiety. Still with PND/orthopnea. She says she is taking her medicines - this includes the amiodarone, zebeta, and higher doses of Lasix. Most likely getting too much salt - cooks foods out of cans/boxes and frozen meals. No chest pain reported. No syncope. She does feel like her heart is more "regular" since getting back on amiodarone. Tells me that the only way she "can get the fluid off is with IV lasix".   She tells me her prior missed appointments were due to upper respiratory illness.   Past Medical History  Diagnosis Date  . Diabetes mellitus without complication ([place])     dx on wed  . Paroxysmal atrial fibrillation ([name])   . Hypertrophic cardiomyopathy ([place])   . Left atrial enlargement   . Tricuspid regurgitation     a. Echo 04/2015: Mod-severe TR. b. Not mentioned on echo 05/2015  . Ventricular tachycardia ([place])     a. s/p St. Jude ICD implanted 04/2015.  [name] CAD (coronary artery disease), native coronary artery - 3 vessel 04/21/2015    a. NSTEMI 8-04/2015 felt 2/2 demand ischemia. b. 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  . Abnormal PFT   . Chronic diastolic CHF (congestive heart failure) ([place])   . Mitral regurgitation     a. Echo 04/2015: moderate mitral regurgitation. b. F/u echo 05/2015: mild MR.  . Diabetes mellitus [facility])     Past Surgical History  Procedure Laterality Date  . Dental surgery  Oct. 2011    several extractions, bone graft  . I&d extremity Left 01/06/2013    Procedure: IRRIGATION AND DEBRIDEMENT LEFT ELBOW AND LEFT FOREARM ; Surgeon: [name], MD; Location: [facility]; Service: Orthopedics;  Laterality: Left;  . Application of wound vac Left 01/06/2013    Procedure: APPLICATION OF WOUND VAC X 2; Surgeon: [name], MD; Location: [facility]; Service: Orthopedics; Laterality: Left; left forearm  . Incision and drainage Left 01/09/2013    Procedure: REDO INCISION AND DRAINAGE LEFT ELBOW; Surgeon: [name], MD; Location: [facility]; Service: Orthopedics; Laterality: Left; SUPINE, UPPER EXTERMITY DRAPE  . Secondary closure of wound Left 01/09/2013    Procedure: SECONDARY CLOSURE OF WOUND LEFT ELBOW; Surgeon: [name], MD; Location: [facility]; Service: Orthopedics; Laterality: Left;  . Application of wound vac Left 01/09/2013    Procedure: APPLICATION OF WOUND VAC; Surgeon: [name], MD; Location: [facility]; Service: Orthopedics; Laterality: Left;  . Ep implantable device N/A 04/16/2015    Procedure: ICD Implant; Surgeon: [name], MD; Location: [facility]; Service: Cardiovascular; Laterality: N/A;  . Cardiac catheterization N/A 04/19/2015    Procedure: Left Heart Cath and Coronary Angiography; Surgeon: [name], MD; Location: [facility]; Service: Cardiovascular; Laterality: N/A;     Medications: Current Outpatient Prescriptions  Medication Sig Dispense Refill  . amiodarone (PACERONE) 200 MG tablet Take 1 tablet (200 mg total) by mouth 2 (two) times daily. 60 tablet 11  . atorvastatin (LIPITOR) 40 MG tablet Take 1 tablet (40 mg total) by mouth daily at 6 PM. 30 tablet 3  . bisoprolol (ZEBETA) 5 MG tablet Take 1 tablet (5 mg total) by mouth daily. 30 tablet 11  . Blood Glucose Monitoring Suppl (TRUE METRIX METER) DEVI 1 each by Does not apply route 2 (two) times daily. 1 Device 0  . diltiazem (CARDIZEM CD) 120 MG 24 hr capsule Take 1 capsule (120 mg total) by mouth daily. 30 capsule 1  . furosemide (LASIX) 40 MG tablet Take 1 tablet (40 mg total) by  mouth 2 (two) times daily. 60 tablet 0  . glipiZIDE (GLUCOTROL) 5 MG tablet Take 5 mg by mouth daily.    [name] glucose blood (TRUE METRIX BLOOD GLUCOSE TEST) test strip Use as instructed twice daily 100 each 12  . levothyroxine (SYNTHROID, LEVOTHROID) 50 MCG tablet TAKE 1 TABLET BY MOUTH DAILY 30 tablet 2  . metFORMIN (GLUCOPHAGE) 500 MG tablet Take 1 tablet (500 mg total) by mouth daily with breakfast. 60 tablet 3  . rivaroxaban (XARELTO) 20 MG TABS tablet Take 1 tablet (20 mg total) by mouth daily. 90 tablet 3  . TRUEPLUS LANCETS 28G MISC Use as instructed twice daily. 60 each 5   No current facility-administered medications for this visit.    Allergies: No Known Allergies  Social History: The patient  reports that she quit smoking about 34 years ago. She has never used smokeless tobacco. She reports that she does not drink alcohol or use illicit drugs.  Family History: The patient's family history includes Breast cancer in her mother; Diabetes in her father; Healthy in her sister; Heart disease in her sister; Stroke in her father.   Review of Systems: Please see the history of present illness. Otherwise, the review of systems is positive for none. All other systems are reviewed and negative.   Physical Exam: VS: BP 120/84 mmHg  Pulse 71  Ht 5\' 4"  (1.626 m)  Wt 181 lb 3.2 oz (82.192 kg)  BMI 31.09 kg/m2 . BMI Body mass index is 31.09 kg/(m^2).  Wt Readings from Last 3 Encounters:  09/17/15 181 lb 3.2 oz (82.192 kg)  09/13/15 177 lb 12.8 oz (80.65 kg)  07/18/15 164 lb (74.39 kg)    General: Pleasant and alert but she looks chronically ill but in no acute distress. Color pale.  HEENT: Normal.  Neck: Supple, +JVD with just sitting upright.  Cardiac: Regular rate and rhythm. +murmur noted. 2+ edema.  Respiratory: Lungs are fairly clear to auscultation bilaterally with normal work of breathing at [employer].  GI: Looks  bloated.  MS: No deformity or atrophy. Gait and ROM intact.  Skin: Warm and dry. Color is sallow. Neuro: Strength and sensation are intact and no gross focal deficits noted.  Psych: Alert, appropriate and with normal affect.   LABORATORY DATA:  EKG: EKG is ordered today. This demonstrates paced rhythm with underlying atrial fib.   Recent Labs    Lab Results  Component Value Date   WBC 7.3 09/13/2015   HGB 10.9* 09/13/2015   HCT 35.0* 09/13/2015   PLT 355 09/13/2015   GLUCOSE 87 09/13/2015   CHOL 133 12/31/2012   TRIG 137 12/31/2012   HDL 8* 12/31/2012   LDLCALC 98 12/31/2012   ALT 13 09/13/2015   AST 16 09/13/2015  NA 143 09/13/2015   K 4.1 09/13/2015   CL 109 09/13/2015   CREATININE 0.82 09/13/2015   BUN 24 09/13/2015   CO2 24 09/13/2015   TSH 2.27 09/13/2015   INR 1.28 05/12/2015   HGBA1C 9.0 07/18/2015      BNP (last 3 results)  Recent Labs (within last 365 days)     Recent Labs  03/30/15 2344 04/13/15 1355 05/12/15 0154  BNP 630.5* 740.3* 299.5*      ProBNP (last 3 results)  Recent Labs (within last 365 days)    No results for input(s): PROBNP in the last 8760 hours.     Other Studies Reviewed Today:  Echo Study Conclusions from 05/2015  - Left ventricle: The cavity size was normal. Systolic function was normal. The estimated ejection fraction was in the range of 60% to 65%. Asymmetic septal hypertrophy consistent with hypertrophic cardiomyopathy. There is no SAM or dynamic obstructive gradient at rest. Wall motion was normal; there were no regional wall motion abnormalities. Doppler parameters are consistent with abnormal left ventricular relaxation (grade 1 diastolic dysfunction). - Aortic valve: There was mild regurgitation. Valve area (VTI): 2.48 cm^2. Valve area (Vmax): 2.7 cm^2. - Mitral valve: Mildly to moderately calcified  annulus. Mildly thickened leaflets . There was mild regurgitation. - Left atrium: The atrium was severely dilated. - Technically adequate study.  Assessment/Plan: 1. Rapid atrial flutter - new diagnosis (but with known history of PAF/VT). It was suspected that her most recent clinical presentation was precipitated by noncompliance with prior recommended medication regimen - patient previously discontinued amiodarone and metoprolol due to nausea and dizziness. She is now back on amiodarone as well as low dose Zebeta. She remains on her anticoagulation as well - says no missed doses. Continues with acute diastolic HF - most likely aggravated by her AF and possibly even CAD. Cardioversion may need to be performed.   2. Acute on chronic diastolic CHF - she is failing on outpatient - admitting for IV diuresis. Her AF is aggravating this as well. Discussed with Dr. Rayann Heman here - he is in agreement - but need to keep in mind long term plan of care. Need to establish compliance going forward.   3. Paroxysmal atrial fibrillation - see above. Continue anticoagulation. May need to consider cardioversion - would defer to EP.   4. Paroxysmal VT - see above regarding resumption of amiodarone. No shocks indicated by interrogation. Follow for recurrence.  5. CAD in native artery - not on ASA due to being on Xarelto. Once she is over this acute episode, she will need to go back to see CT surgery to weigh in on timing of possible CABG.   6. Hypertrophic cardiomyopathy - follow symptoms with above med changes.  7. Abnormal PFTs - once improved from CHF standpoint, would benefit from pulm eval with repeat PFTs.   8. Underlying ICD - not able to check Cor Vue values - looks to have stopped recording - getting Industry to check.    Current medicines are reviewed with the patient today. The patient does not have concerns regarding medicines other than what has been noted above.  The following changes have  been made: See above.  Labs/ tests ordered today include:   Orders Placed This Encounter  Procedures  . EKG 12-Lead     Disposition: Further disposition to follow.   Patient is agreeable to this plan and will call if any problems develop in the interim.   Signed: Burtis Junes, RN,  ANP-C 09/17/2015 12:41 PM  [facility] [address] [place], [place] [address] Phone: [phone] Fax: [phone]                   Referring Provider     Dibas Koirala, MD     Diagnoses     Acute on chronic diastolic CHF (congestive heart failure) ([place]) - Primary    ICD-9-CM: 428.33, 428.0 ICD-10-CM: I50.33       Reason for Visit     Atrial Fibrillation    Atrial Flutter    4 day check - seen for Dr. Lovena Le    Reason for Visit History        Level of Service     PR OFFICE OUTPATIENT VISIT 40 MINUTES [99215]      Follow-up and Disposition     Routing History       All Charges for This Encounter     Code Description Service Date Service Provider Modifiers Qty    [place], COMPLETE 09/17/2015 Burtis Junes, NP  1    [phone] PR OFFICE OUTPATIENT VISIT 40 MINUTES 09/17/2015 Burtis Junes, NP  1      Routing History     From: Burtis Junes, NP On: 09/17/2015 12:54 PM    To: Thompson Grayer, MD, [name], MD, Darlin Coco, MD, Melrose Nakayama, MD    Priority: Routine      AVS Reports     No AVS Snapshots are available for this encounter.     Routing History     Recipient Method Sent by Date Sent    Dibas Koirala, MD Fax Burtis Junes, NP 09/17/2015    Fax: [phone] Phone: [phone]       Previous Visit       Provider Department Encounter #    09/14/2015 1:45 PM Truitt Merle, NP [facility] [place] [id number]       I have seen and examined the patient.  I have reviewed the chart, notes and new  data.  I agree with PA/NP's note. She came to ED instead of direct admission.  PLAN: Right now rhythm is background atrial flutter with 100% RV paced rhythm at 70 bpm, She reports that she has not missed any doses of Xarelto in the last month. Will require IV diuretics and probably DCCV.  Sanda Klein, MD, [place] [phone] 09/17/2015, 5:43 PM

## 2015-09-18 ENCOUNTER — Encounter (HOSPITAL_COMMUNITY): Payer: Self-pay | Admitting: General Practice

## 2015-09-18 ENCOUNTER — Observation Stay (HOSPITAL_COMMUNITY): Payer: PPO

## 2015-09-18 DIAGNOSIS — I4891 Unspecified atrial fibrillation: Secondary | ICD-10-CM

## 2015-09-18 DIAGNOSIS — R0902 Hypoxemia: Secondary | ICD-10-CM | POA: Diagnosis not present

## 2015-09-18 DIAGNOSIS — Z95810 Presence of automatic (implantable) cardiac defibrillator: Secondary | ICD-10-CM | POA: Diagnosis not present

## 2015-09-18 DIAGNOSIS — I484 Atypical atrial flutter: Secondary | ICD-10-CM | POA: Diagnosis not present

## 2015-09-18 DIAGNOSIS — I340 Nonrheumatic mitral (valve) insufficiency: Secondary | ICD-10-CM | POA: Diagnosis not present

## 2015-09-18 DIAGNOSIS — E785 Hyperlipidemia, unspecified: Secondary | ICD-10-CM | POA: Diagnosis not present

## 2015-09-18 DIAGNOSIS — E039 Hypothyroidism, unspecified: Secondary | ICD-10-CM | POA: Diagnosis not present

## 2015-09-18 DIAGNOSIS — I483 Typical atrial flutter: Secondary | ICD-10-CM | POA: Diagnosis not present

## 2015-09-18 DIAGNOSIS — Z87891 Personal history of nicotine dependence: Secondary | ICD-10-CM | POA: Diagnosis not present

## 2015-09-18 DIAGNOSIS — N342 Other urethritis: Secondary | ICD-10-CM | POA: Diagnosis not present

## 2015-09-18 DIAGNOSIS — B952 Enterococcus as the cause of diseases classified elsewhere: Secondary | ICD-10-CM | POA: Diagnosis not present

## 2015-09-18 DIAGNOSIS — I5033 Acute on chronic diastolic (congestive) heart failure: Secondary | ICD-10-CM | POA: Diagnosis not present

## 2015-09-18 DIAGNOSIS — I5021 Acute systolic (congestive) heart failure: Secondary | ICD-10-CM | POA: Diagnosis not present

## 2015-09-18 DIAGNOSIS — I422 Other hypertrophic cardiomyopathy: Secondary | ICD-10-CM

## 2015-09-18 DIAGNOSIS — Z823 Family history of stroke: Secondary | ICD-10-CM | POA: Diagnosis not present

## 2015-09-18 DIAGNOSIS — I2510 Atherosclerotic heart disease of native coronary artery without angina pectoris: Secondary | ICD-10-CM

## 2015-09-18 DIAGNOSIS — Z8249 Family history of ischemic heart disease and other diseases of the circulatory system: Secondary | ICD-10-CM | POA: Diagnosis not present

## 2015-09-18 DIAGNOSIS — I110 Hypertensive heart disease with heart failure: Secondary | ICD-10-CM | POA: Diagnosis not present

## 2015-09-18 DIAGNOSIS — J385 Laryngeal spasm: Secondary | ICD-10-CM | POA: Diagnosis not present

## 2015-09-18 DIAGNOSIS — E119 Type 2 diabetes mellitus without complications: Secondary | ICD-10-CM | POA: Diagnosis not present

## 2015-09-18 DIAGNOSIS — Z7901 Long term (current) use of anticoagulants: Secondary | ICD-10-CM | POA: Diagnosis not present

## 2015-09-18 DIAGNOSIS — I4892 Unspecified atrial flutter: Secondary | ICD-10-CM | POA: Diagnosis not present

## 2015-09-18 DIAGNOSIS — I252 Old myocardial infarction: Secondary | ICD-10-CM | POA: Diagnosis not present

## 2015-09-18 DIAGNOSIS — R9341 Abnormal radiologic findings on diagnostic imaging of renal pelvis, ureter, or bladder: Secondary | ICD-10-CM | POA: Diagnosis not present

## 2015-09-18 DIAGNOSIS — R0602 Shortness of breath: Secondary | ICD-10-CM | POA: Diagnosis not present

## 2015-09-18 DIAGNOSIS — N390 Urinary tract infection, site not specified: Secondary | ICD-10-CM | POA: Diagnosis not present

## 2015-09-18 DIAGNOSIS — I472 Ventricular tachycardia: Secondary | ICD-10-CM

## 2015-09-18 DIAGNOSIS — I5023 Acute on chronic systolic (congestive) heart failure: Secondary | ICD-10-CM | POA: Diagnosis not present

## 2015-09-18 DIAGNOSIS — Z9111 Patient's noncompliance with dietary regimen: Secondary | ICD-10-CM | POA: Diagnosis not present

## 2015-09-18 DIAGNOSIS — I5031 Acute diastolic (congestive) heart failure: Secondary | ICD-10-CM | POA: Diagnosis not present

## 2015-09-18 DIAGNOSIS — I361 Nonrheumatic tricuspid (valve) insufficiency: Secondary | ICD-10-CM | POA: Diagnosis not present

## 2015-09-18 DIAGNOSIS — I480 Paroxysmal atrial fibrillation: Secondary | ICD-10-CM | POA: Diagnosis not present

## 2015-09-18 DIAGNOSIS — Z7984 Long term (current) use of oral hypoglycemic drugs: Secondary | ICD-10-CM | POA: Diagnosis not present

## 2015-09-18 DIAGNOSIS — I509 Heart failure, unspecified: Secondary | ICD-10-CM

## 2015-09-18 DIAGNOSIS — I255 Ischemic cardiomyopathy: Secondary | ICD-10-CM | POA: Diagnosis not present

## 2015-09-18 LAB — GLUCOSE, CAPILLARY
GLUCOSE-CAPILLARY: 124 mg/dL — AB (ref 65–99)
GLUCOSE-CAPILLARY: 189 mg/dL — AB (ref 65–99)
Glucose-Capillary: 104 mg/dL — ABNORMAL HIGH (ref 65–99)
Glucose-Capillary: 126 mg/dL — ABNORMAL HIGH (ref 65–99)
Glucose-Capillary: 140 mg/dL — ABNORMAL HIGH (ref 65–99)

## 2015-09-18 LAB — BASIC METABOLIC PANEL
Anion gap: 12 (ref 5–15)
BUN: 38 mg/dL — ABNORMAL HIGH (ref 6–20)
CO2: 21 mmol/L — ABNORMAL LOW (ref 22–32)
Calcium: 9.1 mg/dL (ref 8.9–10.3)
Chloride: 110 mmol/L (ref 101–111)
Creatinine, Ser: 1.21 mg/dL — ABNORMAL HIGH (ref 0.44–1.00)
GFR calc Af Amer: 52 mL/min — ABNORMAL LOW (ref >60)
GFR calc non Af Amer: 45 mL/min — ABNORMAL LOW (ref >60)
Glucose, Bld: 147 mg/dL — ABNORMAL HIGH (ref 65–99)
Potassium: 3.7 mmol/L (ref 3.5–5.1)
Sodium: 143 mmol/L (ref 135–145)

## 2015-09-18 LAB — TROPONIN I
Troponin I: 0.06 ng/mL — ABNORMAL HIGH (ref <0.031)
Troponin I: 0.06 ng/mL — ABNORMAL HIGH (ref <0.031)

## 2015-09-18 MED ORDER — INFLUENZA VAC SPLIT QUAD 0.5 ML IM SUSY: 0.5000 mL | PREFILLED_SYRINGE | INTRAMUSCULAR | Status: DC

## 2015-09-18 NOTE — Research (Signed)
[email or web address]  Informed Consent   Subject Name: [name]  Subject met inclusion and exclusion criteria.  The informed consent form, study requirements and expectations were reviewed with the subject and questions and concerns were addressed prior to the signing of the consent form.  The subject verbalized understanding of the trail requirements.  The subject agreed to participate in the [email or web address]  trial and signed the informed consent.  The informed consent was obtained prior to performance of any protocol-specific procedures for the subject.  A copy of the signed informed consent was given to the subject and a copy was placed in the subject's medical record.  Sandie Ano 09/18/2015, 5:51 PM

## 2015-09-18 NOTE — Progress Notes (Signed)
  Echocardiogram 2D Echocardiogram has been performed.  Diamond Nickel 09/18/2015, 12:04 PM

## 2015-09-18 NOTE — Progress Notes (Signed)
[facility] CV scheduled for Thursday

## 2015-09-18 NOTE — Progress Notes (Addendum)
Patient Name: [name] Date of Encounter: 09/18/2015  Principal Problem:   Acute diastolic heart failure ([place]) Active Problems:   Diabetes mellitus type 2, noninsulin dependent ([place])   Hypertrophic cardiomyopathy ([place])   Ventricular tachycardia- Sept 2016- ICD   CAD (coronary artery disease), native coronary artery - 3 vessel   Chronic diastolic CHF (congestive heart failure) ([name])   HLD (hyperlipidemia)   ICD in place- St Jude   Atrial flutter ([place])   Chronic anticoagulation-Xarelto   Length of Stay:   SUBJECTIVE  Fair diuresis. Feels much improved. Remains in AF rate controlled. She now has thought back and thinks she has missed at least one dose of Xarelto  CURRENT MEDS . amiodarone  200 mg Oral BID  . atorvastatin  40 mg Oral q1800  . bisoprolol  5 mg Oral Daily  . diltiazem  120 mg Oral Daily  . furosemide  60 mg Intravenous Q12H  . glipiZIDE  5 mg Oral QHS  . [START ON 09/19/2015] Influenza vac split quadrivalent PF  0.5 mL Intramuscular Tomorrow-1000  . levothyroxine  50 mcg Oral QAC breakfast  . metFORMIN  500 mg Oral QHS  . rivaroxaban  20 mg Oral Daily  . sodium chloride flush  3 mL Intravenous Q12H    OBJECTIVE   Intake/Output Summary (Last 24 hours) at 09/18/15 1346 Last data filed at 09/18/15 1235  Gross per 24 hour  Intake    843 ml  Output   1400 ml  Net   -557 ml   Filed Weights   09/17/15 1318 09/17/15 2058 09/18/15 0608  Weight: 82.101 kg (181 lb) 80.377 kg (177 lb 3.2 oz) 79.334 kg (174 lb 14.4 oz)    PHYSICAL EXAM Filed Vitals:   09/17/15 1900 09/17/15 1915 09/17/15 2058 09/18/15 0608  BP: 123/85 105/76 122/75 104/54  Pulse: 65 71 70 71  Temp:   97 F (36.1 C) 97.9 F (36.6 C)  TempSrc:   Oral Oral  Resp: 20 16 18 18   Height:   5\' 4"  (1.626 m)   Weight:   80.377 kg (177 lb 3.2 oz) 79.334 kg (174 lb 14.4 oz)  SpO2: 93% 95% 98% 98%   General: Alert, oriented x3, no distress HEENT: Normal.  Neck: Supple, +JVD with just  sitting upright.  Cardiac: Regular rate and rhythm. +murmur noted. 2+ edema.  Respiratory: Lungs are fairly clear to auscultation bilaterally with normal work of breathing at [employer].  GI: Looks bloated.  MS: No deformity or atrophy. Gait and ROM intact.  Skin: Warm and dry. Color is sallow. Neuro: Strength and sensation are intact and no gross focal deficits noted.  Psych: Alert, appropriate and with normal affect.  LABS  CBC  Recent Labs  09/17/15 1324 09/17/15 2210  WBC 8.4 10.0  NEUTROABS  --  7.4  HGB 11.2* 11.4*  HCT 36.5 37.8  MCV 91.0 90.0  PLT 323 [id number]   Basic Metabolic Panel  Recent Labs  09/17/15 2210 09/18/15 0324  NA 143 143  K 3.9 3.7  CL 108 110  CO2 23 21*  GLUCOSE 92 147*  BUN 37* 38*  CREATININE 1.13* 1.21*  CALCIUM 9.4 9.1  MG 2.0  --    Liver Function Tests  Recent Labs  09/17/15 2210  AST 18  ALT 17  ALKPHOS 123  BILITOT 0.9  PROT 7.0  ALBUMIN 3.9   No results for input(s): LIPASE, AMYLASE in the last 72 hours. Cardiac Enzymes  Recent Labs  09/17/15 2210 09/18/15 0324 09/18/15 1013  TROPONINI 0.06* 0.06* 0.06*    Radiology Studies Imaging results have been reviewed and Dg Chest 2 View  09/18/2015  CLINICAL DATA:  Acute diastolic heart failure. EXAM: CHEST  2 VIEW COMPARISON:  Yesterday FINDINGS: Unchanged cardiomegaly. Stable aortic and hilar contours. Dual-chamber pacer leads from the left are in unremarkable position. Unchanged interstitial coarsening with trace effusions. Persisting focal opacity at the peripheral right base, as discussed on admission study. IMPRESSION: Unchanged chest, including interstitial edema and focal right basilar opacity. Electronically Signed   By: Monte Fantasia M.D.   On: 09/18/2015 08:46   Dg Chest 2 View  09/17/2015  CLINICAL DATA:  Shortness of breath.  Abnormal EKG.  Leg swelling. EXAM: CHEST  2 VIEW COMPARISON:  04/17/2015 FINDINGS: Chronic cardiopericardial enlargement. Stable  appearance of the hila and vascular pedicle. Elevated appearance of the right diaphragm which is likely sub pulmonic effusion. There is diffuse interstitial coarsening compatible with edema. Patchy opacity at the right base which is likely fissural fluid and atelectasis. Although peripheral, there is no specific triangular morphology typical of infarct. Dual-chamber ICD/pacer leads from the left are in stable position. IMPRESSION: 1. Probable CHF with right sub pulmonic effusion. 2. Asymmetric right basilar opacity is likely atelectasis or fissural fluid. In the appropriate clinical setting pneumonia or infarct could have also have this appearance. Electronically Signed   By: Monte Fantasia M.D.   On: 09/17/2015 14:01    TELE AF, ventr rate in 60s  ASSESSMENT AND PLAN   1. Rapid atrial flutter (and previous atrial fibrillation)-  She is now back on amiodarone as well as low dose Zebeta and rate controlled. She remains on her anticoagulation as well - she has missed doses.Plan TEE guided cardioversion, scheduled for Thursday. CHADSVasc at least 69 (age, CAD, CHF, HTN, DM, gender).  2. Acute on chronic diastolic CHF - admitting for IV diuresis. Her AF and rapid rates likely cause of decompensation.  3. Paroxysmal VT - see above regarding resumption of amiodarone. No VT/VF on device interrogation.   4. CAD in native artery - not on ASA due to being on Xarelto. Once she is over this acute episode, she will need to go back to see CT surgery to weigh in on timing of possible CABG.   5. Hypertrophic cardiomyopathy - follow symptoms with above med changes.  6. Abnormal PFTs - once improved from CHF standpoint, would benefit from pulm eval with repeat PFTs.   7. AICD -  Corvue trending down since December 16.   Sanda Klein, MD, [facility] [phone] office [phone] pager 09/18/2015 1:46 PM

## 2015-09-18 NOTE — Progress Notes (Signed)
Patient refuses bed alarm but understands our floor's safety policy regarding falls and having the bed alarm on. Patient understands that staff will continue to hourly round on patient every two hours and states that she understands to call staff in the event she needs assistance to the bathroom or what may be of need. Will continue to monitor patient to end of shift.

## 2015-09-18 NOTE — Care Management Note (Signed)
Case Management Note  Patient Details  Name: [name] MRN: [id number] Date of Birth: 1946/07/06  Subjective/Objective:  Pt lives with dtr and grandson, goes to [facility] for primary care and medications.  She is a retired Marine scientist and feels she has adequate knowledge and resources, declines home health services.                               Expected Discharge Plan:  Home/Self Care  Discharge planning Services  CM Consult  HH Arranged:  Patient Refused  Status of Service:  Completed, signed off  Girard Cooter, [place] 09/18/2015, 2:58 PM

## 2015-09-19 ENCOUNTER — Other Ambulatory Visit: Payer: Self-pay

## 2015-09-19 LAB — BASIC METABOLIC PANEL
Anion gap: 7 (ref 5–15)
BUN: 41 mg/dL — ABNORMAL HIGH (ref 6–20)
CO2: 25 mmol/L (ref 22–32)
Calcium: 8.8 mg/dL — ABNORMAL LOW (ref 8.9–10.3)
Chloride: 108 mmol/L (ref 101–111)
Creatinine, Ser: 1.34 mg/dL — ABNORMAL HIGH (ref 0.44–1.00)
GFR calc Af Amer: 46 mL/min — ABNORMAL LOW (ref >60)
GFR calc non Af Amer: 40 mL/min — ABNORMAL LOW (ref >60)
Glucose, Bld: 111 mg/dL — ABNORMAL HIGH (ref 65–99)
Potassium: 4.3 mmol/L (ref 3.5–5.1)
Sodium: 140 mmol/L (ref 135–145)

## 2015-09-19 LAB — GLUCOSE, CAPILLARY
GLUCOSE-CAPILLARY: 88 mg/dL (ref 65–99)
Glucose-Capillary: 78 mg/dL (ref 65–99)
Glucose-Capillary: 148 mg/dL — ABNORMAL HIGH (ref 65–99)
Glucose-Capillary: 148 mg/dL — ABNORMAL HIGH (ref 65–99)

## 2015-09-19 NOTE — Consult Note (Signed)
   [facility] Inpatient Consult   09/19/2015  AMSI DELAROCA [birth year] [id number] Patient evaluated for community based chronic disease management services with [place] [facility] as a benefit of patient's Health [employer] Advant[employer] Medicare Insurance. Spoke with patient at bedside to explain [place] [facility] services for HF exacerbation and frequent hospital admissions. Consent form signed.  Patient verbalizes some medication cost problems.   Patient will receive post hospital discharge calls and will be evaluated for monthly home visits for assessments and disease process for education.  Left contact information and [facility] literature at bedside. Made Inpatient Case Manager, [place] aware that [place] [facility] following. Of note, [facility] Care Management services does not replace or interfere with any services that are arranged by inpatient case management or social work.  For additional questions or referrals please contact:   Natividad Brood, RN BSN [place] [facility] Liaison  [phone] business mobile phone Toll free office [phone]

## 2015-09-19 NOTE — Hospital Discharge Follow-Up (Signed)
Scientist, research (physical sciences) and [place]:  Patient known to [place]. This Case Manager met with patient at bedside to determine plans for primary care medical follow-up after discharge. Patient indicated she plans to follow-up with Dr. Jarold Song at [facility] and [facility] after discharge. Not interested in [facility] follow-up. Informed patient that this Case Manager could schedule hospital follow-up appointment with Dr. Jarold Song. Patient indicated she has numerous appointments with Specialists arranged and prefers to schedule appointment with Dr. Jarold Song on her own at a later time. Discussed importance of primary care follow-up. Patient verbalized understanding. She indicated she has [employer] and [employer] number, and she indicated she has to spread out her appointments to afford copays. In addition, patient requested food bank information because she indicated she is having difficulty affording food. Gave patient list of food banks and facilities in [facility] that provide free meals. Patient appreciative of information.  Also informed patient she would benefit from going to [place] to determine if eligible for [employer]. Patient indicated she doubted she would qualify for Food Stamps but appreciative of food bank information.  Voicemail left for Olga Coaster, RN CM.

## 2015-09-19 NOTE — Progress Notes (Signed)
Patient Name: [name] Date of Encounter: 09/19/2015  Principal Problem:   Acute diastolic heart failure ([place]) Active Problems:   Atrial flutter ([place])   Diabetes mellitus type 2, noninsulin dependent ([place])   CAD (coronary artery disease), native coronary artery - 3 vessel   Chronic diastolic CHF (congestive heart failure) ([name])   Chronic anticoagulation-Xarelto   Hypertrophic cardiomyopathy ([name])   Ventricular tachycardia- Sept 2016- ICD   HLD (hyperlipidemia)   ICD in place- St Jude   Acute CHF (congestive heart failure) ([place])   Length of Stay: 1  SUBJECTIVE  Fair diuresis. Feels much improved. Remains in AF rate controlled. She now has thought back and thinks she has missed at least one dose of Xarelto  CURRENT MEDS . amiodarone  200 mg Oral BID  . atorvastatin  40 mg Oral q1800  . bisoprolol  5 mg Oral Daily  . diltiazem  120 mg Oral Daily  . furosemide  60 mg Intravenous Q12H  . glipiZIDE  5 mg Oral QHS  . Influenza vac split quadrivalent PF  0.5 mL Intramuscular Tomorrow-1000  . levothyroxine  50 mcg Oral QAC breakfast  . metFORMIN  500 mg Oral QHS  . rivaroxaban  20 mg Oral Daily  . sodium chloride flush  3 mL Intravenous Q12H    OBJECTIVE   Intake/Output Summary (Last 24 hours) at 09/19/15 1426 Last data filed at 09/19/15 1000  Gross per 24 hour  Intake   1065 ml  Output    950 ml  Net    115 ml   Filed Weights   09/17/15 2058 09/18/15 0608 09/19/15 0508  Weight: 177 lb 3.2 oz (80.377 kg) 174 lb 14.4 oz (79.334 kg) 175 lb (79.379 kg)    PHYSICAL EXAM Filed Vitals:   09/19/15 0508 09/19/15 0900 09/19/15 1029 09/19/15 1200  BP: 109/55 101/61 111/64 110/69  Pulse: 59 59 60 61  Temp: 98 F (36.7 C) 97.3 F (36.3 C)  97.3 F (36.3 C)  TempSrc: Oral Oral  Oral  Resp: 18 20  20   Height:      Weight: 175 lb (79.379 kg)     SpO2: 94% 94%  100%   General: Alert, oriented x3, no distress HEENT: Normal.  Neck: Supple, +JVD with just sitting  upright.  Cardiac: Regular rate and rhythm. +murmur noted. 2+ edema.  Respiratory: Lungs are fairly clear to auscultation bilaterally with normal work of breathing at [employer].  GI: Looks bloated.  MS: No deformity or atrophy. Gait and ROM intact.  Skin: Warm and dry. Color is sallow. Neuro: Strength and sensation are intact and no gross focal deficits noted.  Psych: Alert, appropriate and with normal affect.  LABS  CBC  Recent Labs  09/17/15 1324 09/17/15 2210  WBC 8.4 10.0  NEUTROABS  --  7.4  HGB 11.2* 11.4*  HCT 36.5 37.8  MCV 91.0 90.0  PLT 323 [id number]   Basic Metabolic Panel  Recent Labs  09/17/15 2210 09/18/15 0324 09/19/15 0455  NA 143 143 140  K 3.9 3.7 4.3  CL 108 110 108  CO2 23 21* 25  GLUCOSE 92 147* 111*  BUN 37* 38* 41*  CREATININE 1.13* 1.21* 1.34*  CALCIUM 9.4 9.1 8.8*  MG 2.0  --   --    Liver Function Tests  Recent Labs  09/17/15 2210  AST 18  ALT 17  ALKPHOS 123  BILITOT 0.9  PROT 7.0  ALBUMIN 3.9   No results for input(s): LIPASE,  AMYLASE in the last 72 hours. Cardiac Enzymes  Recent Labs  09/17/15 2210 09/18/15 0324 09/18/15 1013  TROPONINI 0.06* 0.06* 0.06*    Radiology Studies Imaging results have been reviewed and Dg Chest 2 View  09/18/2015  CLINICAL DATA:  Acute diastolic heart failure. EXAM: CHEST  2 VIEW COMPARISON:  Yesterday FINDINGS: Unchanged cardiomegaly. Stable aortic and hilar contours. Dual-chamber pacer leads from the left are in unremarkable position. Unchanged interstitial coarsening with trace effusions. Persisting focal opacity at the peripheral right base, as discussed on admission study. IMPRESSION: Unchanged chest, including interstitial edema and focal right basilar opacity. Electronically Signed   By: Monte Fantasia M.D.   On: 09/18/2015 08:46    TELE AF, pacing ventr rate in 60s  ASSESSMENT AND PLAN   1. Rapid atrial flutter (and previous atrial fibrillation)-  She is now back on amiodarone  as well as low dose Zebeta and rate controlled. She remains on her anticoagulation as well - she has missed doses.Plan TEE guided cardioversion, scheduled for Thursday. CHADSVasc at least 88 (age, CAD, CHF, HTN, DM, gender). S/P St Jude pacemaker/ ICD Sept 2016.   2. Acute on chronic diastolic CHF - admitting for IV diuresis. Her AF and rapid rates likely cause of decompensation.  3. Paroxysmal VT - see above regarding resumption of amiodarone. No VT/VF on device interrogation.   4. CAD in native artery - not on ASA due to being on Xarelto. Once she is over this acute episode, she will need to go back to see CT surgery to weigh in on timing of possible CABG.   5. Hypertrophic cardiomyopathy - follow symptoms with above med changes.  6. Abnormal PFTs - once improved from CHF standpoint, would benefit from pulm eval with repeat PFTs.   7. AICD -  [place] trending down since December 16.  Plan: TEE CV tomorrow 11 am with Dr Debara Pickett.  Consider decreasing Lasix and changing to PO- she has an increasing BUN, wgt down 6 lbs, I/O not reliable.       [facility] has been requested to perform a transesophageal echocardiogram on [name] for atrial fibrillation.   After careful review of history and examination, the risks and benefits of transesophageal echocardiogram have been explained including risks of esophageal damage, perforation (1:10,000 risk), bleeding, pharyngeal hematoma as well as other potential complications associated with conscious sedation including aspiration, arrhythmia, respiratory failure and death. Alternatives to treatment were discussed, questions were answered. Patient is willing to proceed.     Kerin Ransom PA-C 09/19/2015 2:28 PM  I have seen and examined the patient along with Kerin Ransom PA-C.  I have reviewed the chart, notes and new data.  I agree with PA's note.  Key new complaints: breathing better; weight down 3 kg Key examination changes: still with pedal  edema, clear lungs Key new findings / data: slight increase in creatinine.  PLAN: TEE guided DCCV in AM. This procedure has been fully reviewed with the patient and written informed consent has been obtained.   Sanda Klein, MD, [place] [phone] 09/19/2015, 4:52 PM

## 2015-09-19 NOTE — Progress Notes (Signed)
Noted previous note from CM,patient does not want any [place] services, patient is agreeable to the [place] program with [facility] for CHF; referral made. Mindi Slicker [facility] [phone]

## 2015-09-20 ENCOUNTER — Encounter (HOSPITAL_COMMUNITY)
Admission: EM | Disposition: A | Discharge status: Home / Self Care | Payer: Self-pay | Source: Home / Self Care | Attending: Internal Medicine

## 2015-09-20 ENCOUNTER — Inpatient Hospital Stay (HOSPITAL_COMMUNITY): Payer: PPO

## 2015-09-20 ENCOUNTER — Inpatient Hospital Stay (HOSPITAL_COMMUNITY): Payer: PPO | Admitting: Anesthesiology

## 2015-09-20 ENCOUNTER — Encounter (HOSPITAL_COMMUNITY): Payer: Self-pay | Admitting: Internal Medicine

## 2015-09-20 DIAGNOSIS — I5043 Acute on chronic combined systolic (congestive) and diastolic (congestive) heart failure: Secondary | ICD-10-CM

## 2015-09-20 DIAGNOSIS — I4892 Unspecified atrial flutter: Secondary | ICD-10-CM

## 2015-09-20 DIAGNOSIS — I340 Nonrheumatic mitral (valve) insufficiency: Secondary | ICD-10-CM

## 2015-09-20 HISTORY — PX: TEE WITHOUT CARDIOVERSION: SHX5443

## 2015-09-20 HISTORY — PX: CARDIOVERSION: SHX1299

## 2015-09-20 LAB — BASIC METABOLIC PANEL
Anion gap: 9 (ref 5–15)
BUN: 45 mg/dL — ABNORMAL HIGH (ref 6–20)
CO2: 26 mmol/L (ref 22–32)
Calcium: 8.9 mg/dL (ref 8.9–10.3)
Chloride: 108 mmol/L (ref 101–111)
Creatinine, Ser: 1.3 mg/dL — ABNORMAL HIGH (ref 0.44–1.00)
GFR calc Af Amer: 48 mL/min — ABNORMAL LOW (ref >60)
GFR calc non Af Amer: 41 mL/min — ABNORMAL LOW (ref >60)
Glucose, Bld: 93 mg/dL (ref 65–99)
Potassium: 4.2 mmol/L (ref 3.5–5.1)
Sodium: 143 mmol/L (ref 135–145)

## 2015-09-20 LAB — GLUCOSE, CAPILLARY
GLUCOSE-CAPILLARY: 171 mg/dL — AB (ref 65–99)
Glucose-Capillary: 66 mg/dL (ref 65–99)
Glucose-Capillary: 98 mg/dL (ref 65–99)
Glucose-Capillary: 66 mg/dL (ref 65–99)
Glucose-Capillary: 80 mg/dL (ref 65–99)
Glucose-Capillary: 150 mg/dL — ABNORMAL HIGH (ref 65–99)

## 2015-09-20 SURGERY — ECHOCARDIOGRAM, TRANSESOPHAGEAL
Anesthesia: Monitor Anesthesia Care

## 2015-09-20 MED ORDER — SODIUM CHLORIDE 0.9 % IV SOLN
INTRAVENOUS | Status: DC | PRN
  Administered 2015-09-20: 11:00:00 via INTRAVENOUS

## 2015-09-20 MED ORDER — EPHEDRINE SULFATE 50 MG/ML IJ SOLN
INTRAMUSCULAR | Status: DC | PRN
  Administered 2015-09-20: 10 mg via INTRAVENOUS

## 2015-09-20 MED ORDER — LIDOCAINE HCL (CARDIAC) 20 MG/ML IV SOLN
INTRAVENOUS | Status: DC | PRN
  Administered 2015-09-20: 60 mg via INTRAVENOUS

## 2015-09-20 MED ORDER — PROPOFOL 500 MG/50ML IV EMUL
INTRAVENOUS | Status: DC | PRN
  Administered 2015-09-20: 100 ug/kg/min via INTRAVENOUS

## 2015-09-20 MED ORDER — DEXTROSE 50 % IV SOLN
INTRAVENOUS | Status: AC
  Filled 2015-09-20: qty 50

## 2015-09-20 MED ORDER — PERFLUTREN LIPID MICROSPHERE
INTRAVENOUS | Status: DC | PRN
  Administered 2015-09-20: 2 mL via INTRAVENOUS

## 2015-09-20 MED ORDER — MENTHOL 3 MG MT LOZG
1.0000 | LOZENGE | OROMUCOSAL | Status: DC | PRN
  Filled 2015-09-20 (×2): qty 9

## 2015-09-20 MED ORDER — SUCCINYLCHOLINE CHLORIDE 20 MG/ML IJ SOLN
INTRAMUSCULAR | Status: DC | PRN
Start: 2015-09-20 — End: 2015-09-20
  Administered 2015-09-20: 100 mg via INTRAVENOUS

## 2015-09-20 MED ORDER — ETOMIDATE 2 MG/ML IV SOLN
INTRAVENOUS | Status: DC | PRN
  Administered 2015-09-20: 18 mg via INTRAVENOUS

## 2015-09-20 MED ORDER — SODIUM CHLORIDE 0.9 % IV SOLN
INTRAVENOUS | Status: DC
  Administered 2015-09-20: 10:00:00 via INTRAVENOUS

## 2015-09-20 MED ORDER — BUTAMBEN-TETRACAINE-BENZOCAINE 2-2-14 % EX AERO
INHALATION_SPRAY | CUTANEOUS | Status: DC | PRN
  Administered 2015-09-20: 2 via TOPICAL

## 2015-09-20 MED ORDER — PERFLUTREN LIPID MICROSPHERE
INTRAVENOUS | Status: AC
  Filled 2015-09-20: qty 10

## 2015-09-20 MED ORDER — DEXTROSE 50 % IV SOLN
25.0000 mL | Freq: Once | INTRAVENOUS | Status: AC
  Administered 2015-09-20: 25 mL via INTRAVENOUS

## 2015-09-20 MED ORDER — LACTATED RINGERS IV SOLN: INTRAVENOUS | Status: DC

## 2015-09-20 MED ORDER — PROPOFOL 10 MG/ML IV BOLUS
INTRAVENOUS | Status: DC | PRN
  Administered 2015-09-20: 30 mg via INTRAVENOUS

## 2015-09-20 MED ORDER — ALBUTEROL SULFATE HFA 108 (90 BASE) MCG/ACT IN AERS
INHALATION_SPRAY | RESPIRATORY_TRACT | Status: DC | PRN
  Administered 2015-09-20 (×4): 2 via RESPIRATORY_TRACT

## 2015-09-20 MED ORDER — TRAZODONE HCL 50 MG PO TABS
50.0000 mg | ORAL_TABLET | Freq: Every day | ORAL | Status: AC
  Administered 2015-09-20: 50 mg via ORAL
  Filled 2015-09-20: qty 1

## 2015-09-20 NOTE — OR Nursing (Signed)
Patient during sedation but prior to TEE laryngospasm with O2 sats dropping into the 70% range.  Patient was intubated and placed on a vent,  Saturations improved and the TEE continued.

## 2015-09-20 NOTE — Progress Notes (Signed)
Hypoglycemic Event  CBG: 66  Treatment: D50 IV 25 mL  Symptoms: None  Follow-up CBG: Time: pending CBG Result: pending  Possible Reasons for Event: Pt NPO  Comments/MD notified: Patient is NPO and is asymptomatic.  Notified Dr. Ellyn Hack. Orders given to recheck CBG and if result is below 100, give other half (25%) of IV injection Dextrose 50%. If CBG result above 100, do nothing.  Due to it being at change of shift when CBG needs to be checked, reported off to day nurse assigned to recheck CBG and follow orders of Dr. Ellyn Hack. Nurse signing off at this time.    Sonnia Strong

## 2015-09-20 NOTE — H&P (Signed)
     INTERVAL PROCEDURE H&P  History and Physical Interval Note:  09/20/2015 10:03 AM  [name] has presented today for their planned procedure. The various methods of treatment have been discussed with the patient and family. After consideration of risks, benefits and other options for treatment, the patient has consented to the procedure.  The patients' outpatient history has been reviewed, patient examined, and no change in status from most recent office note within the past 30 days. I have reviewed the patients' chart and labs and will proceed as planned. Questions were answered to the patient's satisfaction.   Pixie Casino, MD, [facility] Attending Cardiologist [place] [facility] 09/20/2015, 10:03 AM

## 2015-09-20 NOTE — Anesthesia Preprocedure Evaluation (Addendum)
Anesthesia Evaluation  Patient identified by MRN, date of birth, ID band Patient awake    Reviewed: Allergy & Precautions, NPO status , Patient's Chart, lab work & pertinent test results  History of Anesthesia Complications Negative for: history of anesthetic complications  Airway Mallampati: II  TM Distance: >3 FB Neck ROM: Full    Dental  (+) Missing, Dental Advisory Given   Pulmonary shortness of breath, former smoker,    Pulmonary exam normal        Cardiovascular + CAD and +CHF  Normal cardiovascular exam  Study Conclusions  - Left ventricle: The cavity size was normal. There was moderate focal basal hypertrophy. Systolic function was mildly reduced. The estimated ejection fraction was in the range of 45% to 50%. There is akinesis of the basalinferior myocardium. Cannot exclude akinesis of the entireinferolateral myocardium. The study is not technically sufficient to allow evaluation of LV diastolic function. Doppler parameters are consistent with high ventricular filling pressure. - Mitral valve: Calcified annulus. Mild diffuse thickening of the anterior leaflet and posterior leaflet. There was mild regurgitation. - Left atrium: The atrium was severely dilated. - Pulmonary arteries: PA peak pressure: 46 mm Hg (S). - Pericardium, extracardiac: There was a left pleural effusion.  Impressions:  - The right ventricular systolic pressure was increased consistent with moderate pulmonary hypertension.    Neuro/Psych negative neurological ROS  negative psych ROS   GI/Hepatic negative GI ROS, Neg liver ROS,   Endo/Other  diabetesHypothyroidism   Renal/GU Renal InsufficiencyRenal disease     Musculoskeletal   Abdominal   Peds  Hematology   Anesthesia Other Findings   Reproductive/Obstetrics                            Anesthesia Physical Anesthesia Plan  ASA: III  Anesthesia  Plan: MAC and General   Post-op Pain Management:    Induction: Intravenous  Airway Management Planned: Simple Face Mask  Additional Equipment:   Intra-op Plan:   Post-operative Plan:   Informed Consent: I have reviewed the patients History and Physical, chart, labs and discussed the procedure including the risks, benefits and alternatives for the proposed anesthesia with the patient or authorized representative who has indicated his/her understanding and acceptance.   Dental advisory given  Plan Discussed with: CRNA and Anesthesiologist  Anesthesia Plan Comments:        Anesthesia Quick Evaluation

## 2015-09-20 NOTE — Transfer of Care (Signed)
Immediate Anesthesia Transfer of Care Note  Patient: [name]  Procedure(s) Performed: Procedure(s): TRANSESOPHAGEAL ECHOCARDIOGRAM (TEE) (N/A) CARDIOVERSION (N/A)  Patient Location: PACU  Anesthesia Type:General  Level of Consciousness: awake, alert , oriented and patient cooperative  Airway & Oxygen Therapy: Patient Spontanous Breathing and Patient connected to face mask oxygen  Post-op Assessment: Report given to RN, Post -op Vital signs reviewed and stable and Patient moving all extremities X 4  Post vital signs: Reviewed and stable  Last Vitals:  Filed Vitals:   09/20/15 1011 09/20/15 1151  BP: 135/87 112/68  Pulse: 79   Temp: 36.6 C 36.2 C  Resp: 17     Complications: No apparent anesthesia complications

## 2015-09-20 NOTE — Progress Notes (Signed)
  Echocardiogram Echocardiogram Transesophageal with 46mL Definity  has been performed.  Darlina Sicilian M 09/20/2015, 12:13 PM

## 2015-09-20 NOTE — Anesthesia Procedure Notes (Signed)
Procedure Name: Intubation Date/Time: 09/20/2015 11:17 AM Performed by: Lance Coon Pre-anesthesia Checklist: Patient identified, Timeout performed, Emergency Drugs available, Suction available and Patient being monitored Patient Re-evaluated:Patient Re-evaluated prior to inductionOxygen Delivery Method: Circle system utilized Preoxygenation: Pre-oxygenation with 100% oxygen Intubation Type: IV induction Ventilation: Mask ventilation without difficulty Laryngoscope Size: Miller and 2 Grade View: Grade II Tube size: 7.5 mm Number of attempts: 2 (esdophabated first attempt) Airway Equipment and Method: Stylet Placement Confirmation: positive ETCO2,  ETT inserted through vocal cords under direct vision and breath sounds checked- equal and bilateral Secured at: 22 cm Tube secured with: Tape Dental Injury: Teeth and Oropharynx as per pre-operative assessment  Comments: Pt had laryngospasm immediately on initiated of propofol sedation, broken with positive pressure via anesthesia circuit, decision made with team to intubate patient.

## 2015-09-20 NOTE — Progress Notes (Signed)
Patient Name: [name] Date of Encounter: 09/20/2015  Principal Problem:   Acute diastolic heart failure ([place]) Active Problems:   Diabetes mellitus type 2, noninsulin dependent ([place])   Hypertrophic cardiomyopathy ([place])   Ventricular tachycardia- Sept 2016- ICD   CAD (coronary artery disease), native coronary artery - 3 vessel   Chronic diastolic CHF (congestive heart failure) ([name])   HLD (hyperlipidemia)   ICD in place- St Jude   Atrial flutter ([place])   Chronic anticoagulation-Xarelto   Acute CHF (congestive heart failure) ([place])   Length of Stay: 2  SUBJECTIVE  Seen after cardioversion. Developed laryngospasm and required brief endotracheal intubation, but is now feeling well and recovering in PACU. Normal regular rhythm on monitor.  CURRENT MEDS . amiodarone  200 mg Oral BID  . atorvastatin  40 mg Oral q1800  . bisoprolol  5 mg Oral Daily  . diltiazem  120 mg Oral Daily  . furosemide  60 mg Intravenous Q12H  . glipiZIDE  5 mg Oral QHS  . Influenza vac split quadrivalent PF  0.5 mL Intramuscular Tomorrow-1000  . levothyroxine  50 mcg Oral QAC breakfast  . metFORMIN  500 mg Oral QHS  . rivaroxaban  20 mg Oral Daily  . sodium chloride flush  3 mL Intravenous Q12H    OBJECTIVE   Intake/Output Summary (Last 24 hours) at 09/20/15 1404 Last data filed at 09/20/15 1150  Gross per 24 hour  Intake    640 ml  Output   1800 ml  Net  -1160 ml   Filed Weights   09/18/15 0608 09/19/15 0508 09/20/15 0627  Weight: 79.334 kg (174 lb 14.4 oz) 79.379 kg (175 lb) 79.334 kg (174 lb 14.4 oz)    PHYSICAL EXAM Filed Vitals:   09/20/15 1223 09/20/15 1230 09/20/15 1245 09/20/15 1252  BP: 131/66 133/76    Pulse: 71 71 73   Temp:    97.9 F (36.6 C)  TempSrc:      Resp: 20 22 24    Height:      Weight:      SpO2: 93% 92% 94% 94%   General: Alert, oriented x3, no distress Head: no evidence of trauma, PERRL, EOMI, no exophtalmos or lid lag, no myxedema, no xanthelasma;  normal ears, nose and oropharynx Neck: normal jugular venous pulsations and no hepatojugular reflux; brisk carotid pulses without delay and no carotid bruits Chest: clear to auscultation, no signs of consolidation by percussion or palpation, normal fremitus, symmetrical and full respiratory excursions Cardiovascular: normal position and quality of the apical impulse, regular rhythm, normal first and second heart sounds, no rubs or gallops, [id number] systolic ejection aortic focus murmur, no diastolic murmur Abdomen: no tenderness or distention, no masses by palpation, no abnormal pulsatility or arterial bruits, normal bowel sounds, no hepatosplenomegaly Extremities: no clubbing, cyanosis or edema; 2+ radial, ulnar and brachial pulses bilaterally; 2+ right femoral, posterior tibial and dorsalis pedis pulses; 2+ left femoral, posterior tibial and dorsalis pedis pulses; no subclavian or femoral bruits Neurological: grossly nonfocal  LABS  CBC  Recent Labs  09/17/15 2210  WBC 10.0  NEUTROABS 7.4  HGB 11.4*  HCT 37.8  MCV 90.0  PLT 123XX123   Basic Metabolic Panel  Recent Labs  09/17/15 2210  09/19/15 0455 09/20/15 0500  NA 143  < > 140 143  K 3.9  < > 4.3 4.2  CL 108  < > 108 108  CO2 23  < > 25 26  GLUCOSE 92  < >  111* 93  BUN 37*  < > 41* 45*  CREATININE 1.13*  < > 1.34* 1.30*  CALCIUM 9.4  < > 8.8* 8.9  MG 2.0  --   --   --   < > = values in this interval not displayed. Liver Function Tests  Recent Labs  09/17/15 2210  AST 18  ALT 17  ALKPHOS 123  BILITOT 0.9  PROT 7.0  ALBUMIN 3.9   No results for input(s): LIPASE, AMYLASE in the last 72 hours. Cardiac Enzymes  Recent Labs  09/17/15 2210 09/18/15 0324 09/18/15 1013  TROPONINI 0.06* 0.06* 0.06*    Radiology Studies Imaging results have been reviewed and No results found.  TELE NSR (A sense V pace)  ASSESSMENT AND PLAN  1. Rapid atrial flutter (and previous atrial fibrillation)- S/P successful  cardioversion, hopefully will maintain normal rhythm on amiodarone and Zebeta.. Reinforce need for perfect compliance with anticoagulation as well - especially over next 30 days.. CHADSVasc at least 52 (age, CAD, CHF, HTN, DM, gender).   2. Acute on chronic diastolic CHF - fairly meager diuresis lat 24 h. Her AF and rapid rates likely cause of decompensation, expect substantial improvement with better rhythm/rate control. Weight down 6 lb since admission.  3. Paroxysmal VT - on amiodarone. No VT/VF on device interrogation.   4. CAD in native artery - Once she is over this acute episode, she will need to go back to see CT surgery to weigh in on timing of possible CABG.   5. Hypertrophic (nonobstructive) cardiomyopathy  6. Abnormal PFTs - once improved from CHF standpoint, would benefit from pulm eval with repeat PFTs.   7. AICD - S/P St Jude ICD Sept 2016. Normal device interrogation after DCCV.   Sanda Klein, MD, [facility] [phone] office [phone] pager 09/20/2015 2:04 PM

## 2015-09-20 NOTE — Anesthesia Postprocedure Evaluation (Signed)
Anesthesia Post Note  Patient: [name]  Procedure(s) Performed: Procedure(s) (LRB): TRANSESOPHAGEAL ECHOCARDIOGRAM (TEE) (N/A) CARDIOVERSION (N/A)  Patient location during evaluation: PACU Anesthesia Type: General Level of consciousness: sedated Pain management: pain level controlled Vital Signs Assessment: post-procedure vital signs reviewed and stable Respiratory status: spontaneous breathing and respiratory function stable Cardiovascular status: stable Anesthetic complications: no    Last Vitals:  Filed Vitals:   09/20/15 1200 09/20/15 1223  BP:  131/66  Pulse: 68 71  Temp:    Resp: 18 20    Last Pain:  Filed Vitals:   09/20/15 1232  PainSc: 0-No pain                 Alishah Schulte DANIEL

## 2015-09-20 NOTE — CV Procedure (Signed)
TEE/CARDIOVERSION NOTE  TRANSESOPHAGEAL ECHOCARDIOGRAM (TEE):  Indictation: Atrial Flutter  Consent:   Informed consent was obtained prior to the procedure. The risks, benefits and alternatives for the procedure were discussed and the patient comprehended these risks.  Risks include, but are not limited to, cough, sore throat, vomiting, nausea, somnolence, esophageal and stomach trauma or perforation, bleeding, low blood pressure, aspiration, pneumonia, infection, trauma to the teeth and death.    Time Out: Verified patient identification, verified procedure, site/side was marked, verified correct patient position, special equipment/implants available, medications/allergies/relevent history reviewed, required imaging and test results available. Performed  Procedure:  After a procedural time-out, the patient was given propofol per anesthesia for moderate sedation.  The oropharynx was anesthetized 2 cetacaine sprays.  The transesophageal probe was inserted in the esophagus and stomach without difficulty and multiple views were obtained.  The patient was kept under observation until the patient left the procedure room.  The patient left the procedure room in stable condition.   Agitated microbubble saline contrast was not administered. Definity contrast was administered. Complications:    Complications: Patient developed laryngospasm and hypoxia prior to starting the case under the care of anesthesia, we decided to intubate the patient for definitive airway management. This was uncomplicated.  Patient did tolerate procedure well.  Findings:  1. LEFT VENTRICLE: The left ventricular wall thickness is mildly increased.  The left ventricular cavity is normal in size. Wall motion is globally hypokinetic with more severe inferior hypokinesis to akinesis.  LVEF is 35-40%.  2. RIGHT VENTRICLE:  The right ventricle is normal in structure and function without any thrombus or masses.  Pacer/defibrillator wires present.    3. LEFT ATRIUM:  The left atrium is dilated in size without any thrombus or masses.  There is heavy spontaneous echo contrast ("smoke") in the left atrium and LAA consistent with a low flow state.  4. LEFT ATRIAL APPENDAGE:  The left atrial appendage is almost akinetic with very low emptying velocity. There is heavy smoke in the appendage. Definity contrast was administered which was seen fully opacifying the LAA in multiple views and I was confident that the appendage is free of any thrombus or masses. The appendage has single lobes and is a windsock shape. Pulse doppler indicates very low flow in the appendage.  5. ATRIAL SEPTUM:  The atrial septum appears intact and is free of thrombus and/or masses.  There is no evidence for interatrial shunting by color doppler and Definity contrast.  6. RIGHT ATRIUM:  The right atrium is normal in size and function without any thrombus or masses. Pacer/defibrillator wires present.  7. MITRAL VALVE:  The mitral valve is normal in structure and function with Mild regurgitation.  There were no vegetations or stenosis.  8. AORTIC VALVE:  The aortic valve is trileaflet, normal in structure and function with no regurgitation.  There were no vegetations or stenosis  9. TRICUSPID VALVE:  The tricuspid valve is normal in structure and function with Mild regurgitation. RVSP is 32 mmHg + RAP. There were no vegetations or stenosis  10.  PULMONIC VALVE:  The pulmonic valve is normal in structure and function with Mild regurgitation.  There were no vegetations or stenosis. The pulmonary artery was dilated.   11. AORTIC ARCH, ASCENDING AND DESCENDING AORTA:  There was no Ron Parker et. Al, 1992) atherosclerosis of the ascending aorta, aortic arch, or proximal descending aorta.  12. PULMONARY VEINS: Anomalous pulmonary venous return was not noted.  13. PERICARDIUM: The pericardium  appeared normal and non-thickened.  There is no  pericardial effusion.  CARDIOVERSION:     Second Time Out: Verified patient identification, verified procedure, site/side was marked, verified correct patient position, special equipment/implants available, medications/allergies/relevent history reviewed, required imaging and test results available.  Performed  Procedure:  1. Patient placed on cardiac monitor, pulse oximetry, supplemental oxygen as necessary.  2. Sedation administered per anesthesia 3. Pacer pads placed anterior and posterior chest away from the AICD as much as possible. 4. Cardioverted 1 time(s).  5. Cardioverted at 120J biphasic.  Complications:  Complications: None Patient did tolerate procedure well.  Impression:  1. No LAA thrombus by Definity contrast 2. Heavy LA thrombus and very low LAA emptying velocity - almost zero 3. Mild MR, TR 4. Moderate global hypokinesis with more significant inferior hypokinesis to akinesis 5. LVEF 35-40% 6. Successful DCCV with a single 120J biphasic shock - A sense/v pace rhythm confirmed with post-procedure device interrogation.  Recommendations:  1. Will need closer monitoring for procedural laryngospasm. Patient will be transferred to PACU and then to the floor.  Time Spent Directly with the Patient:  60 minutes   Pixie Casino, MD, [facility] Attending Cardiologist [facility]  09/20/2015, 11:43 AM

## 2015-09-21 ENCOUNTER — Encounter (HOSPITAL_COMMUNITY): Payer: Self-pay | Admitting: Internal Medicine

## 2015-09-21 DIAGNOSIS — I5023 Acute on chronic systolic (congestive) heart failure: Secondary | ICD-10-CM

## 2015-09-21 LAB — GLUCOSE, CAPILLARY
GLUCOSE-CAPILLARY: 59 mg/dL — AB (ref 65–99)
GLUCOSE-CAPILLARY: 88 mg/dL (ref 65–99)
GLUCOSE-CAPILLARY: 130 mg/dL — AB (ref 65–99)
GLUCOSE-CAPILLARY: 126 mg/dL — AB (ref 65–99)
Glucose-Capillary: 111 mg/dL — ABNORMAL HIGH (ref 65–99)

## 2015-09-21 MED ORDER — ACETAMINOPHEN 325 MG PO TABS: 650.0000 mg | ORAL_TABLET | ORAL | Status: DC | PRN

## 2015-09-21 MED ORDER — FUROSEMIDE 10 MG/ML IJ SOLN
80.0000 mg | Freq: Two times a day (BID) | INTRAMUSCULAR | Status: DC
  Administered 2015-09-21 – 2015-09-23 (×5): 80 mg via INTRAVENOUS
  Filled 2015-09-21 (×5): qty 8

## 2015-09-21 MED ORDER — METOLAZONE 2.5 MG PO TABS
2.5000 mg | ORAL_TABLET | Freq: Once | ORAL | Status: AC
  Administered 2015-09-21: 2.5 mg via ORAL
  Filled 2015-09-21: qty 1

## 2015-09-21 MED ORDER — TRAZODONE HCL 50 MG PO TABS
50.0000 mg | ORAL_TABLET | Freq: Every evening | ORAL | Status: DC | PRN
  Administered 2015-09-22: 50 mg via ORAL
  Filled 2015-09-21: qty 1

## 2015-09-21 MED ORDER — GLIPIZIDE 5 MG PO TABS
5.0000 mg | ORAL_TABLET | Freq: Every day | ORAL | Status: DC
  Administered 2015-09-22 – 2015-09-23 (×2): 5 mg via ORAL
  Filled 2015-09-21 (×4): qty 1

## 2015-09-21 MED ORDER — TRAZODONE HCL 50 MG PO TABS: 50.0000 mg | ORAL_TABLET | Freq: Every evening | ORAL | Status: DC | PRN

## 2015-09-21 MED ORDER — METFORMIN HCL ER 500 MG PO TB24
500.0000 mg | ORAL_TABLET | Freq: Every day | ORAL | Status: DC
  Administered 2015-09-22 – 2015-09-23 (×2): 500 mg via ORAL
  Filled 2015-09-21 (×2): qty 1

## 2015-09-21 NOTE — Progress Notes (Signed)
CBG this am 59, gave OJ and graham crackers and PB, will continue to monitor, Thanks Arvella Nigh RN

## 2015-09-21 NOTE — Research (Signed)
  ReDS Vest Discharge Study  Results of ReDS reading  Your patient is in the Unblinded arm of the Vest at Discharge study.  The ReDS reading is:  5 ( >39)   Your patient will not be discharged at this time and will have a AHF team consult.  Thank You   The research team

## 2015-09-21 NOTE — Discharge Instructions (Signed)

## 2015-09-21 NOTE — Progress Notes (Signed)
Pt A/Ox4 states she has been getting up in room by herself today, pt refusing bed alarm

## 2015-09-21 NOTE — Progress Notes (Signed)
CBG at 88 now, will continue to monitor, Thanks Arvella Nigh RN

## 2015-09-21 NOTE — Consult Note (Signed)
Advanced Heart Failure Team Consult Note  Referring Physician: ReDs Vest Trial Primary Physician: Dr Lujean Amel Primary Cardiologist:  Dr. 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 Jude ICD 04/2015, & chronic diastolic CHF (EF 0000000 by echo, 45-50% by cath).  She was admitted 09/17/15 with new rapid aflutter (previously PAF/VT) with plans for cardioversion.  She was also noted to be volume overload in setting of non-compliance (thought to be dietary). HF team consulted as part of ReDs Vest Trial where reading greater than >39 indicates a high level of lung water and prompts a HF team consult prior to d/c.  Pts reading today 53.  Pertinent admission labs include K 3.9, Cr 1.13, BNP 784.3, Tropnonin flat without trend. CXR showed probably CHF with R sub pulmonic effusion and asymmetric right basilar opacity likely atelectasis vs fissural fluid.   She is now in NSR s/p successful TEE/DCCV 09/20/15. (she had missed a dose of Xarelto ~3 weeks ago).  She is only negative 1.7 L this admission and weight is unchanged. Creatinine up slightly from admit. Of note, she was 158 lbs in October 2016, and 164 lbs in Dec 2016.  Previously admitted in 03/2015 with urosepsis and near syncope. Discharged home after treatment including rate control therapy for her VT. Returned in just over a week. Continued to have VT and ICD placed 04/16/15. Coronary CT done in preparation for ICD suspicious for severe LAD stenosis. LHC following ICD placement showed significant 3 vessel disease. CT surgery consulted and were to follow as outpatient for possible CABG.   Currently, she claims she takes all medicines as directed.  She has early satiety and abdominal  distention. Also has PND/Orthopnea.  Cooks foods out of cans/boxes and eats frozen meals regularly. She told the HF Nurse Navigator she feels like she only gets relief from IV lasix. She does not weigh daily. Up 20 lbs from October, thinks some is adipose. States increased in weights correlate well with heart rhythm issues. Did not feel like her lasix was working at home, even with increased.  She is not sure if she snores, but she can't remember the last time she woke up feeling refreshed.  + bendopnea.  Gets SOB making bed, sometimes changing clothes, walking to bathroom. She has felt like she has had some wheezing as well. Sleeps with 2 pillows chronically. Doesn't drink a lot of fluid, states definitely less than 2 L, but as above does not monitor sodium. Has felt worse over past 2 weeks ( presumed window of a-flutter) but has been struggling with functional status since at least August. Former smoker, quit in 1982. Denies heavy ETOH use.    Echo TEE 09/20/15 EF 35-40%, + LAE, normal RV.  Echo 09/18/15 EF 45-50, severe LAE, PA peak pressure 46 mm Hg.   PFTs 04/19/15 FVC 1.15 (37%) FEV1 0.87 (37%) TLC 2.54 (50%) DLCO unc 16.43 (67%) DLCO cor 17.61 (72%)  LHC 04/19/15  There is mild left ventricular systolic dysfunction.  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  Severe three-vessel coronary artery disease including mid LAD, diagonal; ramus branch and sequential OM's; chronically totaled RCA with left to right collaterals. The OM 3 chronic total occlusion fed  by left to left collaterals.  Review of Systems: [y] = yes, [ ]  = no   General: Weight gain [y]; Weight loss [ ] ; Anorexia [ ] ; Fatigue [ ] ; Fever [ ] ; Chills [ ] ; Weakness [ ]   Cardiac: Chest pain/pressure [ ] ; Resting SOB [ ] ; Exertional SOB [y]; Orthopnea [y]; Pedal Edema [y]; Palpitations [ ] ; Syncope [ ] ; Presyncope [ ] ; Paroxysmal nocturnal dyspnea[y]  Pulmonary: Cough [ ] ; Wheezing[y];  Hemoptysis[ ] ; Sputum [ ] ; Snoring [ ]   GI: Vomiting[ ] ; Dysphagia[ ] ; Melena[ ] ; Hematochezia [ ] ; Heartburn[ ] ; Abdominal pain [ ] ; Constipation [ ] ; Diarrhea [ ] ; BRBPR [ ]   GU: Hematuria[ ] ; Dysuria [ ] ; Nocturia[ ]   Vascular: Pain in legs with walking [ ] ; Pain in feet with lying flat [ ] ; Non-healing sores [ ] ; Stroke [ ] ; TIA [ ] ; Slurred speech [ ] ;  Neuro: Headaches[ ] ; Vertigo[ ] ; Seizures[ ] ; Paresthesias[ ] ;Blurred vision [ ] ; Diplopia [ ] ; Vision changes [ ]   Ortho/Skin: Arthritis [y]; Joint pain [y]; Muscle pain [ ] ; Joint swelling [ ] ; Back Pain [ ] ; Rash [ ]   Psych: Depression[ ] ; Anxiety[ ]   Heme: Bleeding problems [ ] ; Clotting disorders [ ] ; Anemia [ ]   Endocrine: Diabetes [ ] ; Thyroid dysfunction[ ]   Home Medications Prior to Admission medications   Medication Sig Start Date End Date Taking? Authorizing Provider  amiodarone (PACERONE) 200 MG tablet Take 1 tablet (200 mg total) by mouth 2 (two) times daily. 09/13/15  Yes Dayna N Dunn, PA-C  atorvastatin (LIPITOR) 40 MG tablet Take 1 tablet (40 mg total) by mouth daily at 6 PM. 04/27/15  Yes Arnoldo Morale, MD  bisoprolol (ZEBETA) 5 MG tablet Take 1 tablet (5 mg total) by mouth daily. 09/13/15  Yes Dayna N Dunn, PA-C  diltiazem (CARDIZEM CD) 120 MG 24 hr capsule Take 1 capsule (120 mg total) by mouth daily. 07/18/15  Yes Arnoldo Morale, MD  furosemide (LASIX) 40 MG tablet Take 1 tablet (40 mg total) by mouth 2 (two) times daily. 09/13/15  Yes Dayna N Dunn, PA-C  glipiZIDE (GLUCOTROL) 5 MG tablet Take 5 mg by mouth daily.   Yes Historical Provider, MD  levothyroxine (SYNTHROID, LEVOTHROID) 50 MCG tablet TAKE 1 TABLET BY MOUTH DAILY 09/07/15  Yes Arnoldo Morale, MD  metFORMIN (GLUCOPHAGE) 500 MG tablet Take 1 tablet (500 mg total) by mouth daily with breakfast. 05/21/15  Yes Arnoldo Morale, MD  rivaroxaban (XARELTO) 20 MG TABS tablet Take 1 tablet (20 mg total) by mouth daily. 05/21/15  Yes Arnoldo Morale, MD  acetaminophen (TYLENOL) 325 MG tablet  Take 2 tablets (650 mg total) by mouth every 4 (four) hours as needed for headache or mild pain. 09/21/15   Erlene Quan, PA-C  Blood Glucose Monitoring Suppl (TRUE METRIX METER) DEVI 1 each by Does not apply route 2 (two) times daily. 04/27/15   Arnoldo Morale, MD  glucose blood (TRUE METRIX BLOOD GLUCOSE TEST) test strip Use as instructed twice daily 04/27/15   Arnoldo Morale, MD  traZODone (DESYREL) 50 MG tablet Take 1 tablet (50 mg total) by mouth at bedtime as needed for sleep. 09/21/15   Erlene Quan, PA-C  TRUEPLUS LANCETS 28G MISC Use as instructed twice daily. 04/27/15   Arnoldo Morale, MD    Past Medical History: Past Medical History  Diagnosis Date  . Diabetes mellitus without complication ([place])     dx on wed  . Paroxysmal atrial fibrillation ([name])   . Hypertrophic  cardiomyopathy ([place])   . Left atrial enlargement   . Tricuspid regurgitation     a. Echo 04/2015: Mod-severe TR. b. Not mentioned on echo 05/2015  . Ventricular tachycardia ([place])     a. s/p St. Jude ICD implanted 04/2015.  [name] CAD (coronary artery disease), native coronary artery - 3 vessel 04/21/2015    a. NSTEMI 8-04/2015 felt 2/2 demand ischemia. b. 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  . Abnormal PFT   . Chronic diastolic CHF (congestive heart failure) ([place])   . Mitral regurgitation     a. Echo 04/2015: moderate mitral regurgitation. b. F/u echo 05/2015: mild MR.  . Diabetes mellitus ([place])   . Shortness of breath dyspnea     Past Surgical History: Past Surgical History  Procedure Laterality Date  . Dental surgery  Oct. 2011    several extractions, bone graft  . I&d extremity Left 01/06/2013    Procedure: IRRIGATION AND DEBRIDEMENT LEFT ELBOW AND LEFT FOREARM ;  Surgeon: [name], MD;  Location: [facility];  Service: Orthopedics;  Laterality: Left;  . Application of wound vac Left 01/06/2013    Procedure: APPLICATION OF WOUND VAC X 2;  Surgeon: [name],  MD;  Location: [facility];  Service: Orthopedics;  Laterality: Left;  left forearm  . Incision and drainage Left 01/09/2013    Procedure: REDO INCISION AND DRAINAGE LEFT ELBOW;  Surgeon: [name], MD;  Location: [facility];  Service: Orthopedics;  Laterality: Left;  SUPINE, UPPER EXTERMITY DRAPE  . Secondary closure of wound Left 01/09/2013    Procedure: SECONDARY CLOSURE OF WOUND  LEFT ELBOW;  Surgeon: [name], MD;  Location: [facility];  Service: Orthopedics;  Laterality: Left;  . Application of wound vac Left 01/09/2013    Procedure: APPLICATION OF WOUND VAC;  Surgeon: [name], MD;  Location: [facility];  Service: Orthopedics;  Laterality: Left;  . Ep implantable device N/A 04/16/2015    Procedure: ICD Implant;  Surgeon: [name], MD;  Location: [facility];  Service: Cardiovascular;  Laterality: N/A;  . Cardiac catheterization N/A 04/19/2015    Procedure: Left Heart Cath and Coronary Angiography;  Surgeon: [name], MD;  Location: [facility];  Service: Cardiovascular;  Laterality: N/A;  . Tee without cardioversion N/A 09/20/2015    Procedure: TRANSESOPHAGEAL ECHOCARDIOGRAM (TEE);  Surgeon: Pixie Casino, MD;  Location: [facility];  Service: Cardiovascular;  Laterality: N/A;  . Cardioversion N/A 09/20/2015    Procedure: CARDIOVERSION;  Surgeon: Pixie Casino, MD;  Location: [facility] ENDOSCOPY;  Service: Cardiovascular;  Laterality: N/A;    Family History: Family History  Problem Relation Age of Onset  . Breast cancer Mother   . Diabetes Father   . Stroke Father   . Heart disease Sister   . Healthy Sister     Social History: Social History   Social History  . Marital Status: Single    Spouse Name: N/A  . Number of Children: N/A  . Years of Education: N/A   Social History Main Topics  . Smoking status: Former Smoker -- 4 years    Quit date: 12/31/1980  . Smokeless tobacco: Never Used  . Alcohol Use: No  . Drug Use: No  . Sexual Activity: Not Currently    Other Topics Concern  . None   Social History Narrative   Divorced.  Lives with daughter and grandson. 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    Allergies:  No Known Allergies  Objective:    Vital Signs:   Temp:  [97.5 F (36.4 C)-98.6 F (37 C)] 97.8 F (36.6 C) (02/17 1200) Pulse Rate:  [61-94] 61 (02/17 1200) Resp:  [16-18] 18 (02/17 1200) BP: (110-127)/(55-65) 125/55 mmHg (02/17 1200) SpO2:  [94 %-96 %] 94 % (02/17 1200) Weight:  [174 lb (78.926 kg)] 174 lb (78.926 kg) (02/17 0449) Last BM Date: 09/21/15  Weight change: Filed Weights   09/19/15 0508 09/20/15 0627 09/21/15 0449  Weight: 175 lb (79.379 kg) 174 lb 14.4 oz (79.334 kg) 174 lb (78.926 kg)    Intake/Output:   Intake/Output Summary (Last 24 hours) at 09/21/15 1322 Last data filed at 09/21/15 0854  Gross per 24 hour  Intake    940 ml  Output   1602 ml  Net   -662 ml     Physical Exam: General:  Chronically ill appearing. NAD HEENT: normal Neck: supple. JVP 9-10+. Carotids 2+ bilat; no bruits. No thyromegaly or nodule noted.  Cor: PMI nondisplaced. Distant heart sounds, Regular. Lungs: Bibasilar crackles, ? Scattered wheeze. Normal effort Abdomen: soft, NT, + distention, no HSM. No bruits or masses. +BS  Extremities: no cyanosis, clubbing, rash. 2+ peripheral edema Neuro: alert & orientedx3, cranial nerves grossly intact. moves all 4 extremities w/o difficulty. Affect pleasant  Telemetry: Reviewed Personally, V paced  Labs: Basic Metabolic Panel:  Recent Labs Lab 09/17/15 1324 09/17/15 2210 09/18/15 0324 09/19/15 0455 09/20/15 0500  NA 142 143 143 140 143  K 4.1 3.9 3.7 4.3 4.2  CL 108 108 110 108 108  CO2 22 23 21* 25 26  GLUCOSE 86 92 147* 111* 93  BUN 40* 37* 38* 41* 45*  CREATININE 1.15* 1.13* 1.21* 1.34* 1.30*  CALCIUM 9.0 9.4 9.1 8.8* 8.9  MG  --  2.0  --   --   --     Liver Function Tests:  Recent Labs Lab 09/17/15 2210  AST 18  ALT 17  ALKPHOS 123  BILITOT  0.9  PROT 7.0  ALBUMIN 3.9   No results for input(s): LIPASE, AMYLASE in the last 168 hours. No results for input(s): AMMONIA in the last 168 hours.  CBC:  Recent Labs Lab 09/17/15 1324 09/17/15 2210  WBC 8.4 10.0  NEUTROABS  --  7.4  HGB 11.2* 11.4*  HCT 36.5 37.8  MCV 91.0 90.0  PLT 323 362    Cardiac Enzymes:  Recent Labs Lab 09/17/15 1723 09/17/15 2210 09/18/15 0324 09/18/15 1013  TROPONINI 0.06* 0.06* 0.06* 0.06*    BNP: BNP (last 3 results)  Recent Labs  05/12/15 0154 09/17/15 1728 09/17/15 2210  BNP 299.5* 654.9* 784.3*    ProBNP (last 3 results) No results for input(s): PROBNP in the last 8760 hours.   CBG:  Recent Labs Lab 09/20/15 1725 09/20/15 2036 09/21/15 0558 09/21/15 0645 09/21/15 1154  GLUCAP 150* 171* 59* 88 130*    Coagulation Studies: No results for input(s): LABPROT, INR in the last 72 hours.  Other results: EKG: 09/17/15 Vpaced with PVCs  Imaging:  No results found.   Medications:     Current Medications: . amiodarone  200 mg Oral BID  . atorvastatin  40 mg Oral q1800  . bisoprolol  5 mg Oral Daily  . diltiazem  120 mg Oral Daily  . furosemide  60 mg Intravenous Q12H  . glipiZIDE  5 mg Oral QHS  . Influenza vac split  quadrivalent PF  0.5 mL Intramuscular Tomorrow-1000  . levothyroxine  50 mcg Oral QAC breakfast  . metFORMIN  500 mg Oral QHS  . rivaroxaban  20 mg Oral Daily  . sodium chloride flush  3 mL Intravenous Q12H     Infusions:      Assessment   1. Acute on chronic combined HF - EF 35-40%, primarily ischemic CM. 2. Aflutter RVR s/p TEE/DCCV 3. Paroxysmal VT s/p - s/p St Jude ICD Sept 2016 4. CAD in native artery 5. Hypertrophic cardiomyopathy 6. Abnormal PFTs  Plan    She does have notable volume overload on exam. HF team recommendation is to stay 1-2 days further for continued IV diuresis. Would give IV lasix 80 mg now with 2.5 mg of metolazone. Will continued IV lasix 80 mg BID +/-  metolazone likely tomorrow.   With long history of poor sleep think patient would greatly benefit from sleep study.  She has significant 3-vessel disease and is due for follow up with CT surgery for possible CABG. Needs to wait at least 30 days s/p DCCV.  Pt had markedly abnormal PFTs in 04/2015. ? falsely low due to CHF exacerbation but pt decline repeat or pulmonary follow up at that time.  Would benefit from Pulmonary follow up/repeat PFTs once diuresed fully.   Length of Stay: 3  Shirley Friar PA-C 09/21/2015, 1:22 PM  Advanced Heart Failure Team Pager [phone] (M-F; [place])  Please contact [facility] for night-coverage after hours (4p -7a ) and weekends on [email or web address]  Patient seen and examined with Oda Kilts, PA-C. We discussed all aspects of the encounter. I agree with the assessment and plan as stated above.   I have reviewed her cath films and echo personally. She has very severe 3v CAD with EF ~35%. Admitted with AFL and worsening HF. Now s/p DC-CV. Remains fluid overloaded on exam and markedly elevated ReDS reading (normal 25-35%). Would recommend holding patient from discharge and continuing diuresis with IV lasix and metolazone for at least 2 more days I suspect she has at least 10-15 pounds on board. The HF team will assume care.   In reviewing her cath I am not sure that her distal targets are suitable for CABG and PFTs are also concerning. She will follow with Dr. Recardo Evangelist and Dr. Roxan Hockey for ongoing care.   Vieno Tarrant,MD 4:15 PM

## 2015-09-21 NOTE — Care Management Important Message (Signed)
Important Message  Patient Details  Name: [name] MRN: [id number] Date of Birth: 1946/07/02   Medicare Important Message Given:  Yes    Barb Merino [place] 09/21/2015, 11:44 AM

## 2015-09-21 NOTE — Progress Notes (Signed)
Patient Name: [name] Date of Encounter: 09/21/2015  Principal Problem:   Acute diastolic heart failure ([place]) Active Problems:   Diabetes mellitus type 2, noninsulin dependent ([place])   Hypertrophic cardiomyopathy ([place])   Ventricular tachycardia- Sept 2016- ICD   CAD (coronary artery disease), native coronary artery - 3 vessel   Chronic diastolic CHF (congestive heart failure) ([name])   HLD (hyperlipidemia)   ICD in place- St Jude   Atrial flutter ([place])   Chronic anticoagulation-Xarelto   Acute CHF (congestive heart failure) ([place])   Length of Stay: 3  SUBJECTIVE  Feels much better. Denies dyspnea when walking. Edema has resolved. -2L since admission., but weight down 7 lb. Maintaining normal rhythm.  CURRENT MEDS . amiodarone  200 mg Oral BID  . atorvastatin  40 mg Oral q1800  . bisoprolol  5 mg Oral Daily  . diltiazem  120 mg Oral Daily  . furosemide  60 mg Intravenous Q12H  . glipiZIDE  5 mg Oral QHS  . Influenza vac split quadrivalent PF  0.5 mL Intramuscular Tomorrow-1000  . levothyroxine  50 mcg Oral QAC breakfast  . metFORMIN  500 mg Oral QHS  . rivaroxaban  20 mg Oral Daily  . sodium chloride flush  3 mL Intravenous Q12H    OBJECTIVE   Intake/Output Summary (Last 24 hours) at 09/21/15 1131 Last data filed at 09/21/15 0854  Gross per 24 hour  Intake   1340 ml  Output   1602 ml  Net   -262 ml   Filed Weights   09/19/15 0508 09/20/15 0627 09/21/15 0449  Weight: 79.379 kg (175 lb) 79.334 kg (174 lb 14.4 oz) 78.926 kg (174 lb)    PHYSICAL EXAM Filed Vitals:   09/20/15 2029 09/21/15 0449 09/21/15 0900 09/21/15 1031  BP: 127/65 110/55 110/55 110/55  Pulse: 69 65 94   Temp: 97.9 F (36.6 C) 98.6 F (37 C) 97.5 F (36.4 C)   TempSrc: Oral Oral Oral   Resp: 18 18 16    Height:      Weight:  78.926 kg (174 lb)    SpO2: 96% 94% 94%    General: Alert, oriented x3, no distress Head: no evidence of trauma, PERRL, EOMI, no exophtalmos or lid lag, no  myxedema, no xanthelasma; normal ears, nose and oropharynx Neck: normal jugular venous pulsations and no hepatojugular reflux; brisk carotid pulses without delay and no carotid bruits Chest: clear to auscultation, no signs of consolidation by percussion or palpation, normal fremitus, symmetrical and full respiratory excursions Cardiovascular: normal position and quality of the apical impulse, regular rhythm, normal first and second heart sounds, no rubs or gallops, [id number] systolic ejection aortic focus murmur, no diastolic murmur, Healthy ICD site Abdomen: no tenderness or distention, no masses by palpation, no abnormal pulsatility or arterial bruits, normal bowel sounds, no hepatosplenomegaly Extremities: no clubbing, cyanosis or edema; 2+ radial, ulnar and brachial pulses bilaterally; 2+ right femoral, posterior tibial and dorsalis pedis pulses; 2+ left femoral, posterior tibial and dorsalis pedis pulses; no subclavian or femoral bruits Neurological: grossly nonfocal  LABS  CBC No results for input(s): WBC, NEUTROABS, HGB, HCT, MCV, PLT in the last 72 hours. Basic Metabolic Panel  Recent Labs  09/19/15 0455 09/20/15 0500  NA 140 143  K 4.3 4.2  CL 108 108  CO2 25 26  GLUCOSE 111* 93  BUN 41* 45*  CREATININE 1.34* 1.30*  CALCIUM 8.8* 8.9    Radiology Studies Imaging results have been reviewed and No results  found.  TELE NSR  ASSESSMENT AND PLAN  1. Rapid atrial flutter (and previous atrial fibrillation)- S/P successful cardioversion, hopefully will maintain normal rhythm on amiodarone and Zebeta.. Reinforce need for perfect compliance with anticoagulation as well - especially over next 30 days.. CHADSVasc at least 81 (age, CAD, CHF, HTN, DM, gender).   2. Acute on chronic diastolic CHF - seems to have substantial symptomatic improvement with better rhythm/rate control and diuresis. Weight down 6 lb since admission. Discussed daily weight monitoring and sodium restriction,  signs/symptoms of acute HF. She should call if she has >3lb/24h or 5lb/week weight gain.  3. Paroxysmal VT - on amiodarone. No VT/VF on device interrogation.   4. CAD in native artery -  she will need to go back to see CT surgery to weigh in on timing of possible CABG. Wait minimum of 30 days after cardioversion.  5. Hypertrophic (nonobstructive) cardiomyopathy  6. Abnormal PFTs - once improved from CHF standpoint, would benefit from pulm eval with repeat PFTs.   7. AICD - S/P St Jude ICD Sept 2016. Normal device interrogation after DCCV.  DC home today. She has an appointment with Dr. Lovena Le next week 02/22 at [facility].  Sanda Klein, MD, [facility] [phone] office [phone] pager 09/21/2015 11:31 AM

## 2015-09-21 NOTE — Discharge Summary (Signed)
Discharge Summary    Patient ID: [name],  MRN: [id number], DOB/AGE: October 13, 1945 70 y.o.  Admit date: 09/17/2015 Discharge date: 09/21/2015  Primary Care Provider: Lujean Amel Primary Cardiologist: Dr Mare Ferrari Dr Lovena Le  Discharge Diagnoses    Principal Problem:   Acute diastolic heart failure [facility]) Active Problems:   Atrial flutter ([place])   Diabetes mellitus type 2, noninsulin dependent ([place])   CAD (coronary artery disease), native coronary artery - 3 vessel   Chronic diastolic CHF (congestive heart failure) ([name])   Chronic anticoagulation-Xarelto   Hypertrophic cardiomyopathy ([name])   Ventricular tachycardia- Sept 2016- ICD   HLD (hyperlipidemia)   ICD in place- St Jude   Acute CHF (congestive heart failure) ([name])   Allergies No Known Allergies  Diagnostic Studies/Procedures    TEE CV 09/20/15 _____________   History of Present Illness     70 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 Jude ICD 04/2015, & chronic diastolic CHF (EF 0000000 by echo, 45-50% by cath). Seen at [facility] 09/17/15 and admitted with refractory CHF and intolerance to AF with RVR. Initially we planned to do a DCCV but the pt admitted she had missed at least one dose of Xarelto and plans changed to a TEE CV. Meanwhile she was diuresed with IV Lasix- 4lb wgt loss. On 09/20/15 she underwent TEE DCCV to NSR. She developed laryngospasm and required brief intubation but recovered in PACU post procedure. She was seen by Dr Sallyanne Kuster 09/21/15 and felt to be stable for discharge. She has an appointment with Dr Lovena Le 09/26/15 and will keep this. Dr Sallyanne Kuster has stressed the importance of medication compliance, including Xarelto. She is a CHADs VASc 6.   _____________  Discharge Vitals Blood pressure 125/55, pulse 61, temperature 97.8 F (36.6 C), temperature source Oral, resp. rate 18, height 5\' 4"  (1.626 m), weight 78.926 kg (174 lb), SpO2 94 %.  Filed Weights   09/19/15 0508 09/20/15 0627 09/21/15 0449  Weight: 79.379 kg (175 lb) 79.334 kg (174 lb 14.4 oz) 78.926 kg (174 lb)    Labs & Radiologic Studies     CBC No results for input(s): WBC, NEUTROABS, HGB, HCT, MCV, PLT in the last 72 hours. Basic Metabolic Panel  Recent Labs  09/19/15 0455 09/20/15 0500  NA 140 143  K 4.3 4.2  CL 108 108  CO2 25 26  GLUCOSE 111* 93  BUN 41* 45*  CREATININE 1.34* 1.30*  CALCIUM 8.8* 8.9   Liver Function Tests No results for input(s): AST, ALT, ALKPHOS, BILITOT, PROT, ALBUMIN in the last 72 hours. No results for input(s): LIPASE, AMYLASE in the last 72 hours. Cardiac Enzymes No results for input(s): CKTOTAL, CKMB, CKMBINDEX, TROPONINI in the last 72 hours. BNP Invalid input(s): POCBNP D-Dimer No results for input(s): DDIMER in the last 72 hours. Hemoglobin A1C No results for input(s): HGBA1C in the last 72 hours. Fasting Lipid Panel No results for input(s): CHOL, HDL, LDLCALC, TRIG, CHOLHDL, LDLDIRECT in the last 72 hours. Thyroid Function Tests No results for input(s): TSH, T4TOTAL, T3FREE, THYROIDAB in the last 72 hours.  Invalid input(s): FREET3  Dg Chest 2 View  09/18/2015  CLINICAL DATA:  Acute diastolic heart failure. EXAM: CHEST  2 VIEW COMPARISON:  Yesterday FINDINGS: Unchanged cardiomegaly. Stable  aortic and hilar contours. Dual-chamber pacer leads from the left are in unremarkable position. Unchanged interstitial coarsening with trace effusions. Persisting focal opacity at the peripheral right base, as discussed on admission study. IMPRESSION: Unchanged chest, including interstitial edema and focal right basilar opacity. Electronically Signed   By: Monte Fantasia M.D.   On: 09/18/2015 08:46   Dg Chest 2  View  09/17/2015  CLINICAL DATA:  Shortness of breath.  Abnormal EKG.  Leg swelling. EXAM: CHEST  2 VIEW COMPARISON:  04/17/2015 FINDINGS: Chronic cardiopericardial enlargement. Stable appearance of the hila and vascular pedicle. Elevated appearance of the right diaphragm which is likely sub pulmonic effusion. There is diffuse interstitial coarsening compatible with edema. Patchy opacity at the right base which is likely fissural fluid and atelectasis. Although peripheral, there is no specific triangular morphology typical of infarct. Dual-chamber ICD/pacer leads from the left are in stable position. IMPRESSION: 1. Probable CHF with right sub pulmonic effusion. 2. Asymmetric right basilar opacity is likely atelectasis or fissural fluid. In the appropriate clinical setting pneumonia or infarct could have also have this appearance. Electronically Signed   By: Monte Fantasia M.D.   On: 09/17/2015 14:01    Disposition   Pt is being discharged home today in good condition.  Follow-up Plans & Appointments    Follow-up Information    Follow up with Cristopher Peru, MD On 09/26/2015.   Specialty:  Cardiology   Why:  2 pm   Contact information:   [address] [address] [phone]      Discharge Instructions    AMB Referral to [facility]    Complete by:  As directed   Reason for consult:  Post hospital monitoring for HF exacerbation and care management needs  Diagnoses of:  Heart Failure  Expected date of contact:  1-3 days (reserved for hospital discharges)  Please assign to community nurse for transition of care calls and assess for home visits. Patient concerned about co-pays for specialist follow up.  Questions please call: Natividad Brood, RN BSN [facility] Liaison  [phone] business mobile phone Toll free office [phone]           Discharge Medications   Current Discharge Medication List    START taking these  medications   Details  acetaminophen (TYLENOL) 325 MG tablet Take 2 tablets (650 mg total) by mouth every 4 (four) hours as needed for headache or mild pain.    traZODone (DESYREL) 50 MG tablet Take 1 tablet (50 mg total) by mouth at bedtime as needed for sleep. Qty: 30 tablet, Refills: 0      CONTINUE these medications which have NOT CHANGED   Details  amiodarone (PACERONE) 200 MG tablet Take 1 tablet (200 mg total) by mouth 2 (two) times daily. Qty: 60 tablet, Refills: 11    atorvastatin (LIPITOR) 40 MG tablet Take 1 tablet (40 mg total) by mouth daily at 6 PM. Qty: 30 tablet, Refills: 3    bisoprolol (ZEBETA) 5 MG tablet Take 1 tablet (5 mg total) by mouth daily. Qty: 30 tablet, Refills: 11    diltiazem (CARDIZEM CD) 120 MG 24 hr capsule Take 1 capsule (120 mg total) by mouth daily. Qty: 30 capsule, Refills: 1    furosemide (LASIX) 40 MG tablet Take 1 tablet (40 mg total) by mouth 2 (two) times daily. Qty: 60 tablet, Refills: 0    glipiZIDE (GLUCOTROL) 5 MG tablet Take 5 mg by mouth daily.  levothyroxine (SYNTHROID, LEVOTHROID) 50 MCG tablet TAKE 1 TABLET BY MOUTH DAILY Qty: 30 tablet, Refills: 2    metFORMIN (GLUCOPHAGE) 500 MG tablet Take 1 tablet (500 mg total) by mouth daily with breakfast. Qty: 60 tablet, Refills: 3    rivaroxaban (XARELTO) 20 MG TABS tablet Take 1 tablet (20 mg total) by mouth daily. Qty: 90 tablet, Refills: 3    Blood Glucose Monitoring Suppl (TRUE METRIX METER) DEVI 1 each by Does not apply route 2 (two) times daily. Qty: 1 Device, Refills: 0   Associated Diagnoses: Type 2 diabetes mellitus without complication ([name])    glucose blood (TRUE METRIX BLOOD GLUCOSE TEST) test strip Use as instructed twice daily Qty: 100 each, Refills: 12   Associated Diagnoses: Type 2 diabetes mellitus without complication ([name])    TRUEPLUS LANCETS 28G MISC Use as instructed twice daily. Qty: 60 each, Refills: 5   Associated Diagnoses: Type 2 diabetes mellitus  without complication ([name])         Aspirin prescribed at discharge?  No: On Xarelto High Intensity Statin Prescribed? (Lipitor 40-80mg  or Crestor 20-40mg ): Yes Beta Blocker Prescribed? Yes For EF 45% or less, Was ACEI/ARB Prescribed? No: NA ADP Receptor Inhibitor Prescribed? (i.e. Plavix etc.-Includes Medically Managed Patients): No: NA For EF <40%, Aldosterone Inhibitor Prescribed? No: NA Was EF assessed during THIS hospitalization? Yes Was Cardiac Rehab II ordered? (Included Medically managed Patients): No: NA   Outstanding Labs/Studies    Duration of Discharge Encounter   Greater than 30 minutes including physician time.  Angelena Form K PA 09/21/2015, 2:28 PM

## 2015-09-21 NOTE — Progress Notes (Signed)
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 Jude ICD 04/2015, & chronic diastolic CHF (EF 0000000 by echo, 45-50% by cath).  Seen at [facility] 09/17/15 and admitted--She continues to do poorly. Weight continues to climb. Up 4 more pounds. No response with the extra lasxi. She is more bloated. Early satiety. Still with PND/orthopnea. She says she is taking her medicines - this includes the amiodarone, zebeta, and higher doses of Lasix. Most likely getting too much salt - cooks foods out of cans/boxes and frozen meals. No chest pain reported. No syncope. She does feel like her heart is more "regular" since getting back on amiodarone. Tells me that the only way she "can get the fluid off is with IV lasix".    Past Medical History  Diagnosis Date  . Diabetes mellitus without complication ([place])     dx on wed  . Paroxysmal atrial fibrillation ([name])   . Hypertrophic cardiomyopathy ([place])   . Left atrial enlargement   . Tricuspid regurgitation     a. Echo 04/2015: Mod-severe TR. b. Not mentioned on echo 05/2015  . Ventricular tachycardia ([place])     a. s/p St. Jude ICD implanted 04/2015.  [name] CAD (coronary artery disease), native coronary artery - 3 vessel 04/21/2015    a. NSTEMI 8-04/2015 felt 2/2 demand ischemia. b. 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  . Abnormal PFT   . Chronic diastolic CHF (congestive heart failure) ([place])   . Mitral regurgitation     a. Echo 04/2015: moderate mitral regurgitation. b. F/u echo 05/2015: mild MR.  . Diabetes mellitus ([place])   . Shortness of breath dyspnea     Social History   Social History  . Marital Status: Single    Spouse Name: N/A  . Number of Children: N/A  . Years of  Education: N/A   Social History Main Topics  . Smoking status: Former Smoker -- 4 years    Quit date: 12/31/1980  . Smokeless tobacco: Never Used  . Alcohol Use: No  . Drug Use: No  . Sexual Activity: Not Currently   Other Topics Concern  . None   Social History Narrative   Divorced.  Lives with daughter and grandson. 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    ECHO:Study Conclusions--09/18/15  - Left ventricle: The cavity size was normal. There was moderate focal basal hypertrophy. Systolic function was mildly reduced. The estimated ejection fraction was in the range of 45% to 50%. There is akinesis of the basalinferior myocardium. Cannot exclude akinesis of the entireinferolateral myocardium. The study is not technically sufficient to allow evaluation of LV diastolic function. Doppler parameters are consistent with high ventricular filling pressure. - Mitral valve: Calcified annulus. Mild diffuse thickening of the anterior leaflet and posterior leaflet. There was mild regurgitation. - Left atrium: The atrium was severely dilated. - Pulmonary arteries: PA peak pressure: 46 mm Hg (S). - Pericardium, extracardiac: There was a left pleural effusion.  Impressions:  - The right ventricular systolic pressure was increased consistent with moderate pulmonary hypertension.  Transthoracic echocardiography. M-mode, complete 2D, spectral Doppler, and color Doppler. Birthdate: Patient birthdate: 03/08/1946. Age: Patient is 70 yr old. Sex: Gender: female. BMI: 29.8 kg/m^2. Blood pressure:   104/54 Patient  status: Inpatient. Study date: Study date: 09/18/2015. Study time: 11:28 AM. Location: Echo laboratory.  BNP    Component Value Date/Time   BNP 784.3* 09/17/2015 2210    ProBNP    Component Value Date/Time   PROBNP 6103.0* 01/02/2013 0354     Education Assessment and Provision:  Detailed education and instructions provided on  heart failure disease management including the following:  Signs and symptoms of Heart Failure When to call the physician Importance of daily weights Low sodium diet Fluid restriction Medication management Anticipated future follow-up appointments  Patient education given on each of the above topics.  Patient acknowledges understanding and acceptance of all instructions.  I spoke briefly with [name] regarding her HF while doing ReDS vest reading.  She tells me that she has received education before reagrding HF recommendations.  She does have a scale however admits that she does not weigh daily.  She says that she has only had these "issues since she began having heart rhythm issues."  I encouraged the use of daily weights and how weight increases relate to the signs and symptoms of HF.  I briefly reviewed a low sodium diet and high sodium foods to avoid.  She denies any issues with getting or taking prescribed medications.  She follows with [facility] as outpatient.    Education Materials:  "Living Better With Heart Failure" Booklet, Daily Weight Tracker Tool    High Risk Criteria for Readmission and/or Poor Patient Outcomes:   EF <30%- No 45-50%  2 or more admissions in 6 months- 4/6 mo  Difficult social situation- No  Demonstrates medication noncompliance- denies    Barriers of Care:  Knowledge and compliance  Discharge Planning:   She plans to return to home in [place] with daughter and grandson.  She would greatly benefit from [facility] for ongoing education, compliance reinforcement and symptom recognition.  She declined previously--perhaps she will change her mind?

## 2015-09-22 DIAGNOSIS — I5021 Acute systolic (congestive) heart failure: Secondary | ICD-10-CM

## 2015-09-22 LAB — BASIC METABOLIC PANEL
Anion gap: 11 (ref 5–15)
BUN: 36 mg/dL — ABNORMAL HIGH (ref 6–20)
CHLORIDE: 103 mmol/L (ref 101–111)
CO2: 28 mmol/L (ref 22–32)
CREATININE: 1.27 mg/dL — AB (ref 0.44–1.00)
Calcium: 9.2 mg/dL (ref 8.9–10.3)
GFR calc non Af Amer: 43 mL/min — ABNORMAL LOW (ref >60)
GFR, EST AFRICAN AMERICAN: 49 mL/min — AB (ref >60)
Glucose, Bld: 118 mg/dL — ABNORMAL HIGH (ref 65–99)
POTASSIUM: 3.7 mmol/L (ref 3.5–5.1)
Sodium: 142 mmol/L (ref 135–145)

## 2015-09-22 LAB — GLUCOSE, CAPILLARY
GLUCOSE-CAPILLARY: 102 mg/dL — AB (ref 65–99)
GLUCOSE-CAPILLARY: 153 mg/dL — AB (ref 65–99)
Glucose-Capillary: 137 mg/dL — ABNORMAL HIGH (ref 65–99)
Glucose-Capillary: 133 mg/dL — ABNORMAL HIGH (ref 65–99)

## 2015-09-22 LAB — MAGNESIUM: Magnesium: 2 mg/dL (ref 1.7–2.4)

## 2015-09-22 MED ORDER — METOLAZONE 5 MG PO TABS
5.0000 mg | ORAL_TABLET | Freq: Two times a day (BID) | ORAL | Status: DC
  Administered 2015-09-22 – 2015-09-23 (×4): 5 mg via ORAL
  Filled 2015-09-22 (×4): qty 1

## 2015-09-22 MED ORDER — SPIRONOLACTONE 25 MG PO TABS
12.5000 mg | ORAL_TABLET | Freq: Every day | ORAL | Status: DC
  Administered 2015-09-22 – 2015-09-24 (×3): 12.5 mg via ORAL
  Filled 2015-09-22 (×4): qty 1

## 2015-09-22 MED ORDER — POTASSIUM CHLORIDE CRYS ER 10 MEQ PO TBCR
40.0000 meq | EXTENDED_RELEASE_TABLET | Freq: Two times a day (BID) | ORAL | Status: DC
  Administered 2015-09-22 – 2015-09-24 (×5): 40 meq via ORAL
  Filled 2015-09-22 (×9): qty 4

## 2015-09-22 NOTE — Progress Notes (Addendum)
Advanced Heart Failure Rounding Note   Subjective:    Discharge held yesterday due to high ReDS reading. Lasix increased. Metolazone added. -3.5L overnight. Weight down 3 pound. Breathing better.   Creatinine improving. K 3.7. Maintaining NSR.    Objective:   Weight Range:  Vital Signs:   Temp:  [97.8 F (36.6 C)-98.4 F (36.9 C)] 98.3 F (36.8 C) (02/18 0537) Pulse Rate:  [60-61] 60 (02/18 0537) Resp:  [18] 18 (02/18 0537) BP: (110-125)/(55-60) 125/55 mmHg (02/18 0537) SpO2:  [92 %-94 %] 92 % (02/18 0537) Weight:  [77.928 kg (171 lb 12.8 oz)] 77.928 kg (171 lb 12.8 oz) (02/18 0537) Last BM Date: 09/21/15  Weight change: Filed Weights   09/20/15 0627 09/21/15 0449 09/22/15 0537  Weight: 79.334 kg (174 lb 14.4 oz) 78.926 kg (174 lb) 77.928 kg (171 lb 12.8 oz)    Intake/Output:   Intake/Output Summary (Last 24 hours) at 09/22/15 0934 Last data filed at 09/22/15 [id number]  Gross per 24 hour  Intake    900 ml  Output   3500 ml  Net  -2600 ml     Physical Exam: General: Chronically ill appearing. NAD HEENT: normal Neck: supple. JVP 9-10+. Carotids 2+ bilat; no bruits. No thyromegaly or nodule noted.  Cor: PMI nondisplaced. Distant heart sounds, Regular. Lungs: Bibasilar crackles, ? Scattered wheeze. Normal effort Abdomen: soft, NT, + distention, no HSM. No bruits or masses. +BS  Extremities: no cyanosis, clubbing, rash. 2+ peripheral edema Neuro: alert & orientedx3, cranial nerves grossly intact. moves all 4 extremities w/o difficulty. Affect pleasant  Telemetry: Reviewed Personally, V paced  Labs: Basic Metabolic Panel:  Recent Labs Lab 09/17/15 2210 09/18/15 0324 09/19/15 0455 09/20/15 0500 09/22/15 0523  NA 143 143 140 143 142  K 3.9 3.7 4.3 4.2 3.7  CL 108 110 108 108 103  CO2 23 21* 25 26 28   GLUCOSE 92 147* 111* 93 118*  BUN 37* 38* 41* 45* 36*  CREATININE 1.13* 1.21* 1.34* 1.30* 1.27*  CALCIUM 9.4 9.1 8.8* 8.9 9.2  MG 2.0  --   --   --  2.0     Liver Function Tests:  Recent Labs Lab 09/17/15 2210  AST 18  ALT 17  ALKPHOS 123  BILITOT 0.9  PROT 7.0  ALBUMIN 3.9   No results for input(s): LIPASE, AMYLASE in the last 168 hours. No results for input(s): AMMONIA in the last 168 hours.  CBC:  Recent Labs Lab 09/17/15 1324 09/17/15 2210  WBC 8.4 10.0  NEUTROABS  --  7.4  HGB 11.2* 11.4*  HCT 36.5 37.8  MCV 91.0 90.0  PLT 323 362    Cardiac Enzymes:  Recent Labs Lab 09/17/15 1723 09/17/15 2210 09/18/15 0324 09/18/15 1013  TROPONINI 0.06* 0.06* 0.06* 0.06*    BNP: BNP (last 3 results)  Recent Labs  05/12/15 0154 09/17/15 1728 09/17/15 2210  BNP 299.5* 654.9* 784.3*    ProBNP (last 3 results) No results for input(s): PROBNP in the last 8760 hours.    Other results:  Imaging:  No results found.   Medications:     Scheduled Medications: . amiodarone  200 mg Oral BID  . atorvastatin  40 mg Oral q1800  . bisoprolol  5 mg Oral Daily  . diltiazem  120 mg Oral Daily  . furosemide  80 mg Intravenous BID  . glipiZIDE  5 mg Oral Q supper  . Influenza vac split quadrivalent PF  0.5 mL Intramuscular Tomorrow-1000  .  levothyroxine  50 mcg Oral QAC breakfast  . metFORMIN  500 mg Oral Q supper  . rivaroxaban  20 mg Oral Daily  . sodium chloride flush  3 mL Intravenous Q12H     Infusions:     PRN Medications:  sodium chloride, acetaminophen, menthol-cetylpyridinium, ondansetron (ZOFRAN) IV, sodium chloride flush, traZODone   Assessment:   1. Acute on chronic combined HF - EF 35-40%, primarily ischemic CM. 2. Aflutter RVR s/p TEE/DCCV 3. Paroxysmal VT s/p - s/p St Jude ICD Sept 2016 4. CAD in native artery 5. Hypertrophic cardiomyopathy 6. Abnormal PFTs   Plan/Discussion:    Diuresing well. Still with fluid on board. Continue IV lasix and metolazone. Follow electrolytes. Stop diltiazem with low EF. Start spiro. Maintaining NSR. Continue anti-coagulation.   In reviewing her  cath I am not sure that her distal targets are suitable for CABG and PFTs are also concerning. She will follow with Dr. Recardo Evangelist and Dr. Roxan Hockey for ongoing care.   Length of Stay: 4   Glori Bickers MD 09/22/2015, 9:34 AM  Advanced Heart Failure Team Pager [phone] (M-F; [place])  Please contact [facility] for night-coverage after hours (4p -7a ) and weekends on [email or web address]

## 2015-09-23 LAB — BASIC METABOLIC PANEL
Anion gap: 11 (ref 5–15)
BUN: 39 mg/dL — ABNORMAL HIGH (ref 6–20)
CALCIUM: 9.2 mg/dL (ref 8.9–10.3)
CO2: 28 mmol/L (ref 22–32)
CREATININE: 1.35 mg/dL — AB (ref 0.44–1.00)
Chloride: 103 mmol/L (ref 101–111)
GFR, EST AFRICAN AMERICAN: 46 mL/min — AB (ref >60)
GFR, EST NON AFRICAN AMERICAN: 40 mL/min — AB (ref >60)
Glucose, Bld: 116 mg/dL — ABNORMAL HIGH (ref 65–99)
Potassium: 4.1 mmol/L (ref 3.5–5.1)
SODIUM: 142 mmol/L (ref 135–145)

## 2015-09-23 LAB — URINE MICROSCOPIC-ADD ON: SQUAMOUS EPITHELIAL / LPF: NONE SEEN

## 2015-09-23 LAB — URINALYSIS, ROUTINE W REFLEX MICROSCOPIC
Bilirubin Urine: NEGATIVE
GLUCOSE, UA: NEGATIVE mg/dL
KETONES UR: NEGATIVE mg/dL
Nitrite: NEGATIVE
PH: 7 (ref 5.0–8.0)
Protein, ur: NEGATIVE mg/dL
Specific Gravity, Urine: 1.008 (ref 1.005–1.030)

## 2015-09-23 LAB — GLUCOSE, CAPILLARY
Glucose-Capillary: 104 mg/dL — ABNORMAL HIGH (ref 65–99)
Glucose-Capillary: 122 mg/dL — ABNORMAL HIGH (ref 65–99)
Glucose-Capillary: 188 mg/dL — ABNORMAL HIGH (ref 65–99)
Glucose-Capillary: 91 mg/dL (ref 65–99)

## 2015-09-23 MED ORDER — DEXTROSE 5 % IV SOLN
2.0000 g | INTRAVENOUS | Status: DC
  Administered 2015-09-22: 2 g via INTRAVENOUS
  Filled 2015-09-23 (×2): qty 2

## 2015-09-23 NOTE — Progress Notes (Addendum)
Advanced Heart Failure Rounding Note   Subjective:    Discharge held on Friday due to high ReDS reading. Lasix increased. Metolazone added. Weight down 4 pounds overnight.   Denies dyspnea. Renal function stable. Maintaining NSR.  C/o cloudy urine.    Objective:   Weight Range:  Vital Signs:   Temp:  [97.4 F (36.3 C)-98.6 F (37 C)] 98.6 F (37 C) (02/19 0517) Pulse Rate:  [59-60] 59 (02/19 0517) Resp:  [18-20] 18 (02/19 0517) BP: (101-133)/(46-64) 101/46 mmHg (02/19 0517) SpO2:  [95 %-96 %] 95 % (02/19 0517) Weight:  [75.796 kg (167 lb 1.6 oz)] 75.796 kg (167 lb 1.6 oz) (02/19 0517) Last BM Date: 09/21/15  Weight change: Filed Weights   09/21/15 0449 09/22/15 0537 09/23/15 0517  Weight: 78.926 kg (174 lb) 77.928 kg (171 lb 12.8 oz) 75.796 kg (167 lb 1.6 oz)    Intake/Output:   Intake/Output Summary (Last 24 hours) at 09/23/15 1142 Last data filed at 09/23/15 0900  Gross per 24 hour  Intake   1200 ml  Output   3350 ml  Net  -2150 ml     Physical Exam: General: Sitting in bed. NAD HEENT: normal Neck: supple. JVP 7. Carotids 2+ bilat; no bruits. No thyromegaly or nodule noted.  Cor: PMI nondisplaced. Distant heart sounds, Regular. Lungs: clear Abdomen: soft, NT, ND, no HSM. No bruits or masses. +BS  Extremities: no cyanosis, clubbing, rash. Tr-1+ peripheral edema Neuro: alert & orientedx3, cranial nerves grossly intact. moves all 4 extremities w/o difficulty. Affect pleasant  Telemetry: NSR   Labs: Basic Metabolic Panel:  Recent Labs Lab 09/17/15 2210 09/18/15 0324 09/19/15 0455 09/20/15 0500 09/22/15 0523 09/23/15 0314  NA 143 143 140 143 142 142  K 3.9 3.7 4.3 4.2 3.7 4.1  CL 108 110 108 108 103 103  CO2 23 21* 25 26 28 28   GLUCOSE 92 147* 111* 93 118* 116*  BUN 37* 38* 41* 45* 36* 39*  CREATININE 1.13* 1.21* 1.34* 1.30* 1.27* 1.35*  CALCIUM 9.4 9.1 8.8* 8.9 9.2 9.2  MG 2.0  --   --   --  2.0  --     Liver Function Tests:  Recent  Labs Lab 09/17/15 2210  AST 18  ALT 17  ALKPHOS 123  BILITOT 0.9  PROT 7.0  ALBUMIN 3.9   No results for input(s): LIPASE, AMYLASE in the last 168 hours. No results for input(s): AMMONIA in the last 168 hours.  CBC:  Recent Labs Lab 09/17/15 1324 09/17/15 2210  WBC 8.4 10.0  NEUTROABS  --  7.4  HGB 11.2* 11.4*  HCT 36.5 37.8  MCV 91.0 90.0  PLT 323 362    Cardiac Enzymes:  Recent Labs Lab 09/17/15 1723 09/17/15 2210 09/18/15 0324 09/18/15 1013  TROPONINI 0.06* 0.06* 0.06* 0.06*    BNP: BNP (last 3 results)  Recent Labs  05/12/15 0154 09/17/15 1728 09/17/15 2210  BNP 299.5* 654.9* 784.3*    ProBNP (last 3 results) No results for input(s): PROBNP in the last 8760 hours.    Other results:  Imaging: No results found.   Medications:     Scheduled Medications: . amiodarone  200 mg Oral BID  . atorvastatin  40 mg Oral q1800  . bisoprolol  5 mg Oral Daily  . furosemide  80 mg Intravenous BID  . glipiZIDE  5 mg Oral Q supper  . Influenza vac split quadrivalent PF  0.5 mL Intramuscular Tomorrow-1000  . levothyroxine  50  mcg Oral QAC breakfast  . metFORMIN  500 mg Oral Q supper  . metolazone  5 mg Oral BID  . potassium chloride  40 mEq Oral BID  . rivaroxaban  20 mg Oral Daily  . sodium chloride flush  3 mL Intravenous Q12H  . spironolactone  12.5 mg Oral Daily    Infusions:    PRN Medications: sodium chloride, acetaminophen, menthol-cetylpyridinium, ondansetron (ZOFRAN) IV, sodium chloride flush, traZODone   Assessment:   1. Acute on chronic combined HF - EF 35-40%, primarily ischemic CM. 2. Aflutter RVR s/p TEE/DCCV 3. Paroxysmal VT s/p - s/p St Jude ICD Sept 2016 4. CAD in native artery 5. Hypertrophic cardiomyopathy 6. Abnormal PFTs 7. Dysuira   Plan/Discussion:    Diuresing well. Still with mile fluid overload.Renal function stable. Continue IV lasix and metolazone today. Follow electrolytes. Likely home tomorrow back on  lasix 40 bid.   Maintaining NSR. Continue anti-coagulation.   Check UA and UCX due to dysuria.  In reviewing her cath I am not sure that her distal targets are suitable for CABG and PFTs are also concerning. She will follow with Dr. Recardo Evangelist and Dr. Roxan Hockey for ongoing care.   Length of Stay: 5   Margel Joens MD 09/23/2015, 11:42 AM  Advanced Heart Failure Team Pager [phone] (M-F; [place])  Please contact [facility] for night-coverage after hours (4p -7a ) and weekends on [email or web address]

## 2015-09-23 NOTE — Progress Notes (Signed)
Pharmacy Antibiotic Note  [name] is a 70 y.o. female admitted on 09/17/2015 with UTI.  Pharmacy has been consulted for ceftriaxone dosing.  Plan: Start ceftriaxone 2 gram q 24 hour  Monitor clinical progress, c/s, abx plan/LOT   Height: 5\' 4"  (162.6 cm) Weight: 167 lb 1.6 oz (75.796 kg) (scale a) IBW/kg (Calculated) : 54.7  Temp (24hrs), Avg:98.1 F (36.7 C), Min:97.7 F (36.5 C), Max:98.6 F (37 C)   Recent Labs Lab 09/17/15 1324 09/17/15 2210 09/18/15 0324 09/19/15 0455 09/20/15 0500 09/22/15 0523 09/23/15 0314  WBC 8.4 10.0  --   --   --   --   --   CREATININE 1.15* 1.13* 1.21* 1.34* 1.30* 1.27* 1.35*    Estimated Creatinine Clearance: 40.3 mL/min (by C-G formula based on Cr of 1.35).    No Known Allergies  Antimicrobials this admission: 2/19 Rocephin >>    Dose adjustments this admission: none  Microbiology results: 2/19 UCx: sent   Thank you for allowing pharmacy to be a part of this patient's care.  Thank you for allowing [place] to participate in this patients care. Jens Som, PharmD Pager: [phone] 09/23/2015 8:56 PM

## 2015-09-23 NOTE — Progress Notes (Signed)
Brief X-Cover Note  Notified by RN that urinalysis grossly + for UTI with many bacteria and large leuk esterase and +TNTC wbc so CTX pharmacy consult written while speciation occurs.   Jules Husbands, MD

## 2015-09-24 DIAGNOSIS — N342 Other urethritis: Secondary | ICD-10-CM

## 2015-09-24 LAB — BASIC METABOLIC PANEL
ANION GAP: 12 (ref 5–15)
BUN: 39 mg/dL — ABNORMAL HIGH (ref 6–20)
CALCIUM: 9.5 mg/dL (ref 8.9–10.3)
CO2: 29 mmol/L (ref 22–32)
CREATININE: 1.33 mg/dL — AB (ref 0.44–1.00)
Chloride: 100 mmol/L — ABNORMAL LOW (ref 101–111)
GFR, EST AFRICAN AMERICAN: 47 mL/min — AB (ref >60)
GFR, EST NON AFRICAN AMERICAN: 40 mL/min — AB (ref >60)
Glucose, Bld: 119 mg/dL — ABNORMAL HIGH (ref 65–99)
Potassium: 4.1 mmol/L (ref 3.5–5.1)
SODIUM: 141 mmol/L (ref 135–145)

## 2015-09-24 LAB — GLUCOSE, CAPILLARY: Glucose-Capillary: 107 mg/dL — ABNORMAL HIGH (ref 65–99)

## 2015-09-24 LAB — MAGNESIUM: MAGNESIUM: 2.1 mg/dL (ref 1.7–2.4)

## 2015-09-24 MED ORDER — LEVOFLOXACIN 500 MG PO TABS: 500.0000 mg | ORAL_TABLET | Freq: Every day | ORAL | Status: DC

## 2015-09-24 MED ORDER — FUROSEMIDE 40 MG PO TABS
40.0000 mg | ORAL_TABLET | Freq: Two times a day (BID) | ORAL | Status: DC
  Administered 2015-09-24: 40 mg via ORAL
  Filled 2015-09-24: qty 1

## 2015-09-24 MED ORDER — SPIRONOLACTONE 25 MG PO TABS: 12.5000 mg | ORAL_TABLET | Freq: Every day | ORAL | Status: DC

## 2015-09-24 MED ORDER — LEVOFLOXACIN 500 MG PO TABS
500.0000 mg | ORAL_TABLET | Freq: Every day | ORAL | Status: DC
  Administered 2015-09-24: 500 mg via ORAL
  Filled 2015-09-24: qty 1

## 2015-09-24 NOTE — Progress Notes (Signed)
Patient given discharge follow-up appt with map and directions to AHF Clinic reviewed.

## 2015-09-24 NOTE — Discharge Summary (Signed)
Advanced Heart Failure Discharge Note   Discharge Summary   Patient ID: [name] MRN: [id number], DOB/AGE: 1946/05/11 70 y.o. Admit date: 09/17/2015 D/C date:     09/24/2015   Primary Discharge Diagnoses:  1. Acute on chronic diastolic HF 2. Aflutter s/p TEE/DCCV 09/20/15 3. DM2 4. CAD, native vessel x 3 5. Chronic anticoagulation on Xarelto 6. Hypertrophic CM 7. VT s/p ICD 04/2018 8. 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 Jude ICD 04/2015, & chronic diastolic CHF (EF 0000000 by echo, 45-50% by cath). She was admitted 09/17/15 with new rapid aflutter (previously PAF/VT) with plans for cardioversion. She was also noted to be volume overload in setting of non-compliance (thought to be dietary). Initially planned to do a DCCV but the pt admitted she had missed at least one dose of Xarelto and plans changed to a TEE CV. Meanwhile she was diuresed with IV Lasix up to 60 mg BID with a 4lb wt loss. On 09/20/15 she underwent TEE DCCV to NSR. She developed laryngospasm and required brief intubation but recovered in PACU post procedure. Otherwise without complications.  Was seen on 09/20/16 and thought to be stable for discharge, though had been previously enrolled in the ReDS vest trial, where reading greater than >39 indicates a high level of lung water and prompts a HF team consult. Pts reading 53%.  On HF team exam, pt thought to be fluid overloaded and kept for further diuresis.  She was diuresed with 80 mg of IV lasix BID and 5 mg of metolazone BID.  Creatinine remained stable and patient diuresed an additional 8 L and down 14 lbs.   As part of ReDS vest trial, reading was repeated on day of intended discahrge.  Pts reading 38%, consistent with improved fluid status.  With continued mild elevation, she will benefit from close HF follow up.    Overall she diuresed 8.3 L and down 21 lbs from highest weight this admission.  She will be discharged to home with close follow up in the HF clinic as below.  She will also continue follow up with Dr Lovena Le and Dr Mare Ferrari.     Discharge Weight Range: 160 lbs Discharge Vitals: Blood pressure 118/54, pulse 61, temperature 98.5 F (36.9 C), temperature source Oral, resp. rate 18, height 5\' 4"  (1.626 m), weight 160 lb 11.2 oz (72.893 kg), SpO2 95 %.  Labs: Lab Results  Component Value Date   WBC 10.0 09/17/2015   HGB 11.4* 09/17/2015   HCT 37.8 09/17/2015   MCV 90.0 09/17/2015   PLT 362 09/17/2015    Recent Labs Lab 09/17/15 2210  09/24/15 0205  NA 143  < > 141  K 3.9  < > 4.1  CL 108  < > 100*  CO2 23  < > 29  BUN 37*  < > 39*  CREATININE 1.13*  < > 1.33*  CALCIUM 9.4  < > 9.5  PROT 7.0  --   --   BILITOT 0.9  --   --   ALKPHOS 123  --   --   ALT 17  --   --   AST 18  --   --   GLUCOSE 92  < > 119*  < > = values in this interval not displayed. Lab  Results  Component Value Date   CHOL 133 12/31/2012   HDL 8* 12/31/2012   LDLCALC 98 12/31/2012   TRIG 137 12/31/2012   BNP (last 3 results)  Recent Labs  05/12/15 0154 09/17/15 1728 09/17/15 2210  BNP 299.5* 654.9* 784.3*    ProBNP (last 3 results) No results for input(s): PROBNP in the last 8760 hours.   Diagnostic Studies/Procedures   No results found.  Discharge Medications     Medication List    TAKE these medications        acetaminophen 325 MG tablet  Commonly known as:  TYLENOL  Take 2 tablets (650 mg total) by mouth every 4 (four) hours as needed for headache or mild pain.     amiodarone 200 MG tablet  Commonly known as:  PACERONE  Take 1 tablet (200 mg total) by mouth 2 (two) times daily.     atorvastatin 40 MG tablet  Commonly known as:  LIPITOR  Take 1 tablet (40 mg total) by mouth daily at 6 PM.      bisoprolol 5 MG tablet  Commonly known as:  ZEBETA  Take 1 tablet (5 mg total) by mouth daily.     diltiazem 120 MG 24 hr capsule  Commonly known as:  CARDIZEM CD  Take 1 capsule (120 mg total) by mouth daily.     furosemide 40 MG tablet  Commonly known as:  LASIX  Take 1 tablet (40 mg total) by mouth 2 (two) times daily.     glipiZIDE 5 MG tablet  Commonly known as:  GLUCOTROL  Take 5 mg by mouth daily.     glucose blood test strip  Commonly known as:  TRUE METRIX BLOOD GLUCOSE TEST  Use as instructed twice daily     levofloxacin 500 MG tablet  Commonly known as:  LEVAQUIN  Take 1 tablet (500 mg total) by mouth daily.  Start taking on:  09/25/2015     levothyroxine 50 MCG tablet  Commonly known as:  SYNTHROID, LEVOTHROID  TAKE 1 TABLET BY MOUTH DAILY     metFORMIN 500 MG tablet  Commonly known as:  GLUCOPHAGE  Take 1 tablet (500 mg total) by mouth daily with breakfast.     rivaroxaban 20 MG Tabs tablet  Commonly known as:  XARELTO  Take 1 tablet (20 mg total) by mouth daily.     spironolactone 25 MG tablet  Commonly known as:  ALDACTONE  Take 0.5 tablets (12.5 mg total) by mouth daily.     traZODone 50 MG tablet  Commonly known as:  DESYREL  Take 1 tablet (50 mg total) by mouth at bedtime as needed for sleep.     TRUE METRIX METER Devi  1 each by Does not apply route 2 (two) times daily.     TRUEPLUS LANCETS 28G Misc  Use as instructed twice daily.        Disposition   The patient will be discharged in stable condition to home. Discharge Instructions    AMB Referral to [facility] Management    Complete by:  As directed   Reason for consult:  Post hospital monitoring for HF exacerbation and care management needs  Diagnoses of:  Heart Failure  Expected date of contact:  1-3 days (reserved for hospital discharges)  Please assign to community nurse for transition of care calls and assess for home visits. Patient concerned about co-pays for specialist follow up.   Questions please call: Natividad Brood, RN BSN CCM [facility]  [phone] business mobile phone Toll free office [phone]     Diet - low sodium heart healthy    Complete by:  As directed      Heart Failure patients record your daily weight using the same scale at the same time of day    Complete by:  As directed      Increase activity slowly    Complete by:  As directed           Follow-up Information    Follow up with Cristopher Peru, MD On 09/26/2015.   Specialty:  Cardiology   Why:  2 pm   Contact information:   [address] [address] [phone]       Follow up with [facility] On 10/02/2015.   Specialty:  Cardiology   Why:  at 1120 for post hospital follow up. Please bring all of your medications to your visit.  The code for parking is 0001.   Contact information:   [address] [id number] [place] [phone]        Duration of Discharge Encounter: Greater than 35 minutes   Signed, Annamaria Helling 09/24/2015, 9:25 AM  Patient seen and examined with Oda Kilts, PA-C. We discussed all aspects of the encounter. I agree with the assessment and plan as stated above.   Diuresed well. Ready for d/c. F/u in HF Clinic.   Alania Overholt,MD 5:52 PM

## 2015-09-24 NOTE — Care Management Important Message (Signed)
Important Message  Patient Details  Name: [name] MRN: [id number] Date of Birth: 1946-01-01   Medicare Important Message Given:  Yes    Geralda, Frasier 09/24/2015, 11:12 AM

## 2015-09-24 NOTE — Progress Notes (Signed)
ReDS Vest Discharge Study  Results of ReDS reading  Your patient is in the Unblinded arm of the Vest at Discharge study.  The ReDS reading is:  35  ( < 39)  Your patient is ok for discharge.   Thank You   The research team

## 2015-09-24 NOTE — Progress Notes (Signed)
Advanced Heart Failure Rounding Note   Subjective:    Discharge held on Friday due to high ReDS reading.   Feels great. Now euvolemic. No dyspnea or orthopnea. Started on CTX for UTI yesterday. UCX pending    Objective:   Weight Range:  Vital Signs:   Temp:  [97.7 F (36.5 C)-98.6 F (37 C)] 98 F (36.7 C) (02/19 2030) Pulse Rate:  [59-65] 65 (02/19 2030) Resp:  [16-20] 16 (02/19 2030) BP: (101-126)/(46-60) 120/60 mmHg (02/19 2030) SpO2:  [93 %-95 %] 94 % (02/19 2030) Weight:  [75.796 kg (167 lb 1.6 oz)] 75.796 kg (167 lb 1.6 oz) (02/19 0517) Last BM Date: 09/21/15  Weight change: Filed Weights   09/21/15 0449 09/22/15 0537 09/23/15 0517  Weight: 78.926 kg (174 lb) 77.928 kg (171 lb 12.8 oz) 75.796 kg (167 lb 1.6 oz)    Intake/Output:   Intake/Output Summary (Last 24 hours) at 09/24/15 0427 Last data filed at 09/23/15 2100  Gross per 24 hour  Intake    960 ml  Output   1750 ml  Net   -790 ml     Physical Exam: General: lying in bed. NAD HEENT: normal Neck: supple. JVP 6 Carotids 2+ bilat; no bruits. No thyromegaly or nodule noted.  Cor: PMI nondisplaced. Distant heart sounds, Regular. Lungs: clear Abdomen: soft, NT, ND, no HSM. No bruits or masses. +BS  Extremities: no cyanosis, clubbing, rash. No  edema Neuro: alert & orientedx3, cranial nerves grossly intact. moves all 4 extremities w/o difficulty. Affect pleasant  Telemetry: NSR   Labs: Basic Metabolic Panel:  Recent Labs Lab 09/17/15 2210  09/19/15 0455 09/20/15 0500 09/22/15 0523 09/23/15 0314 09/24/15 0205  NA 143  < > 140 143 142 142 141  K 3.9  < > 4.3 4.2 3.7 4.1 4.1  CL 108  < > 108 108 103 103 100*  CO2 23  < > 25 26 28 28 29   GLUCOSE 92  < > 111* 93 118* 116* 119*  BUN 37*  < > 41* 45* 36* 39* 39*  CREATININE 1.13*  < > 1.34* 1.30* 1.27* 1.35* 1.33*  CALCIUM 9.4  < > 8.8* 8.9 9.2 9.2 9.5  MG 2.0  --   --   --  2.0  --  2.1  < > = values in this interval not  displayed.  Liver Function Tests:  Recent Labs Lab 09/17/15 2210  AST 18  ALT 17  ALKPHOS 123  BILITOT 0.9  PROT 7.0  ALBUMIN 3.9   No results for input(s): LIPASE, AMYLASE in the last 168 hours. No results for input(s): AMMONIA in the last 168 hours.  CBC:  Recent Labs Lab 09/17/15 1324 09/17/15 2210  WBC 8.4 10.0  NEUTROABS  --  7.4  HGB 11.2* 11.4*  HCT 36.5 37.8  MCV 91.0 90.0  PLT 323 362    Cardiac Enzymes:  Recent Labs Lab 09/17/15 1723 09/17/15 2210 09/18/15 0324 09/18/15 1013  TROPONINI 0.06* 0.06* 0.06* 0.06*    BNP: BNP (last 3 results)  Recent Labs  05/12/15 0154 09/17/15 1728 09/17/15 2210  BNP 299.5* 654.9* 784.3*    ProBNP (last 3 results) No results for input(s): PROBNP in the last 8760 hours.    Other results:  Imaging: No results found.   Medications:     Scheduled Medications: . amiodarone  200 mg Oral BID  . atorvastatin  40 mg Oral q1800  . bisoprolol  5 mg Oral Daily  .  cefTRIAXone (ROCEPHIN)  IV  2 g Intravenous Q24H  . furosemide  80 mg Intravenous BID  . glipiZIDE  5 mg Oral Q supper  . Influenza vac split quadrivalent PF  0.5 mL Intramuscular Tomorrow-1000  . levothyroxine  50 mcg Oral QAC breakfast  . metFORMIN  500 mg Oral Q supper  . metolazone  5 mg Oral BID  . potassium chloride  40 mEq Oral BID  . rivaroxaban  20 mg Oral Daily  . sodium chloride flush  3 mL Intravenous Q12H  . spironolactone  12.5 mg Oral Daily    Infusions:    PRN Medications: sodium chloride, acetaminophen, menthol-cetylpyridinium, ondansetron (ZOFRAN) IV, sodium chloride flush, traZODone   Assessment:   1. Acute on chronic combined HF - EF 35-40%, primarily ischemic CM. 2. Aflutter RVR s/p TEE/DCCV 3. Paroxysmal VT s/p - s/p St Jude ICD Sept 2016 4. CAD in native artery 5. Hypertrophic cardiomyopathy 6. Abnormal PFTs 7. UTI  Plan/Discussion:    She is dry. Can go home today back on lasix 40 bid. Reinforced need  for daily weights and reviewed use of sliding scale diuretics. Hopefully volume status will be easier to maintain now that she is back in NSR. Will need close f/u in HF Clinic. Will need ReDS reading prior to d/c. Weight down nearly 20 pounds.   Maintaining NSR. Continue anti-coagulation.   On CTX for UTI. Will get second dose today. Can send home on 3 days of levaquin.   In reviewing her cath I am not sure that her distal targets are suitable for CABG and PFTs are also concerning. She will follow with Dr. Recardo Evangelist and Dr. Roxan Hockey for ongoing care.   Length of Stay: 6   Bensimhon, Daniel MD 09/24/2015, 4:27 AM  Advanced Heart Failure Team Pager [phone] (M-F; 7a - 4p)  Please contact [facility] for night-coverage after hours (4p -7a ) and weekends on [email or web address]

## 2015-09-24 NOTE — Progress Notes (Signed)
Pt has orders to be discharged. Discharge instructions given and pt has no additional questions at this time. Medication regimen reviewed and pt educated. Pt verbalized understanding and has no additional questions. Telemetry box removed. IV removed and site in good condition. Pt stable and waiting for transportation.   Jonni Oelkers RN 

## 2015-09-25 ENCOUNTER — Other Ambulatory Visit: Payer: Self-pay | Admitting: *Deleted

## 2015-09-25 ENCOUNTER — Telehealth: Payer: Self-pay | Admitting: Cardiovascular Disease

## 2015-09-25 NOTE — Telephone Encounter (Signed)
Called to notify of positive urine culture. No answer. Recommend Cipro 500 mg BID x 3 days (but the recommendation may change based on sensitivities that should be available in next 24 hours).

## 2015-09-25 NOTE — Patient Outreach (Addendum)
[facility]) Care Management  09/25/2015  [name] 09-12-1945 [id number]   Assessment: Transition of care - attempt 1 Referral received from hospital liaison Eliott Nine) for transition of care calls, assess for home visits and has concerns about co-pays for specialists' follow-up. Call placed to speak to patient but unable to reach her. HIPAA compliant voice message left with name and contact number.  Plan: Will await for return call.  If unable to receive a call back, will schedule for next outreach call.  Britanee Vanblarcom A. Kyrstan Gotwalt, BSN, RN-BC [place] Management Coordinator Cell: [phone]

## 2015-09-26 ENCOUNTER — Encounter: Payer: Medicare Other | Admitting: Internal Medicine

## 2015-09-26 ENCOUNTER — Other Ambulatory Visit: Payer: Self-pay | Admitting: *Deleted

## 2015-09-26 ENCOUNTER — Telehealth: Payer: Self-pay

## 2015-09-26 LAB — URINE CULTURE

## 2015-09-26 NOTE — Progress Notes (Signed)
Again attempted to call with results of urine culture. Organism sensitive to Cipro. If she calls back, recommend Cipro 500 mg BID for 3 days for urinary tract infection.

## 2015-09-26 NOTE — Patient Outreach (Addendum)
[facility]) Care Management  09/26/2015  ALEAYAH THAM 1945-10-12 [id number]    Assessment: Transition of care follow-up call- attempt 2 Unable to receive a call back from patient to message left on her voicemail yesterday. Call placed to speak with patient but unable to reach her. HIPAA compliant voice message left with name and contact information. Unsuccessful telephone call to patient for a scheduled appointment.  Plan: Will await for return call. If unable to receive a call back, will schedule again for next outreach call.   Lillyen Schow A. Josafat Enrico, BSN, RN-BC [facility] Management Coordinator Cell: [phone]

## 2015-09-26 NOTE — Telephone Encounter (Signed)
Patient referred to [facility] clinic by Dr Rayann Heman.  Attempted ICM intro call to patient and no answer.

## 2015-10-01 ENCOUNTER — Telehealth: Payer: Self-pay | Admitting: Internal Medicine

## 2015-10-01 MED FILL — XARELTO 20 MG TABLET: 20 | 30 days supply | Qty: 30 | Fill #3

## 2015-10-01 NOTE — Telephone Encounter (Signed)
No number was taken for RN at [facility] to return call.  patient has an appointment tomorrow with CHF clinic.

## 2015-10-01 NOTE — Telephone Encounter (Signed)
New Message  [facility] management,- stating pt c/o arm where IV was- muscle from elbow to shoulder is hurting the pt- no red stripe, not hot to the touch, pt is on xarelto- reports taking 800 mg of motrin twice about 12 hours apart. Rn from [facility] wants RN to follow up on pt- has appt on 10/04/15. Please call back and discuss.

## 2015-10-02 ENCOUNTER — Ambulatory Visit (HOSPITAL_COMMUNITY)
Admit: 2015-10-02 | Discharge: 2015-10-02 | Disposition: A | Discharge status: Home / Self Care | Payer: PPO | Source: Ambulatory Visit | Attending: Internal Medicine | Admitting: Internal Medicine

## 2015-10-02 ENCOUNTER — Other Ambulatory Visit: Payer: Self-pay | Admitting: *Deleted

## 2015-10-02 VITALS — BP 120/66 | HR 60 | Ht 64.0 in | Wt 158.2 lb

## 2015-10-02 DIAGNOSIS — I472 Ventricular tachycardia: Secondary | ICD-10-CM | POA: Diagnosis not present

## 2015-10-02 DIAGNOSIS — Z823 Family history of stroke: Secondary | ICD-10-CM | POA: Diagnosis not present

## 2015-10-02 DIAGNOSIS — Z833 Family history of diabetes mellitus: Secondary | ICD-10-CM | POA: Insufficient documentation

## 2015-10-02 DIAGNOSIS — Z8249 Family history of ischemic heart disease and other diseases of the circulatory system: Secondary | ICD-10-CM | POA: Diagnosis not present

## 2015-10-02 DIAGNOSIS — I5043 Acute on chronic combined systolic (congestive) and diastolic (congestive) heart failure: Secondary | ICD-10-CM | POA: Insufficient documentation

## 2015-10-02 DIAGNOSIS — I5042 Chronic combined systolic (congestive) and diastolic (congestive) heart failure: Secondary | ICD-10-CM | POA: Diagnosis not present

## 2015-10-02 DIAGNOSIS — E119 Type 2 diabetes mellitus without complications: Secondary | ICD-10-CM | POA: Diagnosis not present

## 2015-10-02 DIAGNOSIS — I255 Ischemic cardiomyopathy: Secondary | ICD-10-CM | POA: Insufficient documentation

## 2015-10-02 DIAGNOSIS — I252 Old myocardial infarction: Secondary | ICD-10-CM | POA: Insufficient documentation

## 2015-10-02 DIAGNOSIS — I4891 Unspecified atrial fibrillation: Secondary | ICD-10-CM | POA: Insufficient documentation

## 2015-10-02 DIAGNOSIS — Z7901 Long term (current) use of anticoagulants: Secondary | ICD-10-CM

## 2015-10-02 DIAGNOSIS — Z79899 Other long term (current) drug therapy: Secondary | ICD-10-CM | POA: Insufficient documentation

## 2015-10-02 DIAGNOSIS — I5031 Acute diastolic (congestive) heart failure: Secondary | ICD-10-CM | POA: Diagnosis not present

## 2015-10-02 DIAGNOSIS — Z87891 Personal history of nicotine dependence: Secondary | ICD-10-CM | POA: Diagnosis not present

## 2015-10-02 DIAGNOSIS — Z95810 Presence of automatic (implantable) cardiac defibrillator: Secondary | ICD-10-CM | POA: Insufficient documentation

## 2015-10-02 DIAGNOSIS — I2510 Atherosclerotic heart disease of native coronary artery without angina pectoris: Secondary | ICD-10-CM | POA: Insufficient documentation

## 2015-10-02 DIAGNOSIS — Z7984 Long term (current) use of oral hypoglycemic drugs: Secondary | ICD-10-CM | POA: Diagnosis not present

## 2015-10-02 DIAGNOSIS — I5022 Chronic systolic (congestive) heart failure: Secondary | ICD-10-CM

## 2015-10-02 DIAGNOSIS — I5023 Acute on chronic systolic (congestive) heart failure: Secondary | ICD-10-CM | POA: Insufficient documentation

## 2015-10-02 DIAGNOSIS — I480 Paroxysmal atrial fibrillation: Secondary | ICD-10-CM | POA: Insufficient documentation

## 2015-10-02 DIAGNOSIS — M79601 Pain in right arm: Secondary | ICD-10-CM | POA: Diagnosis not present

## 2015-10-02 DIAGNOSIS — Z7902 Long term (current) use of antithrombotics/antiplatelets: Secondary | ICD-10-CM | POA: Diagnosis not present

## 2015-10-02 HISTORY — DX: Chronic systolic (congestive) heart failure: I50.22

## 2015-10-02 LAB — BASIC METABOLIC PANEL
ANION GAP: 12 (ref 5–15)
BUN: 53 mg/dL — ABNORMAL HIGH (ref 6–20)
CALCIUM: 9.3 mg/dL (ref 8.9–10.3)
CO2: 28 mmol/L (ref 22–32)
Chloride: 102 mmol/L (ref 101–111)
Creatinine, Ser: 1.45 mg/dL — ABNORMAL HIGH (ref 0.44–1.00)
GFR, EST AFRICAN AMERICAN: 42 mL/min — AB (ref >60)
GFR, EST NON AFRICAN AMERICAN: 36 mL/min — AB (ref >60)
Glucose, Bld: 136 mg/dL — ABNORMAL HIGH (ref 65–99)
Potassium: 4.8 mmol/L (ref 3.5–5.1)
Sodium: 142 mmol/L (ref 135–145)

## 2015-10-02 MED ORDER — AMIODARONE HCL 200 MG PO TABS: 200.0000 mg | ORAL_TABLET | Freq: Every day | ORAL | Status: DC

## 2015-10-02 NOTE — Progress Notes (Signed)
ReDS Vest reading performed  ReDS Vest Result= 39 CHEST RULER=12.5  VEST FITTING TASKS: POSTURE=standing HEIGHT MARKER=S CENTER STRIP=aligned  COMMENTS:approved by [employer] [place]

## 2015-10-02 NOTE — Patient Instructions (Signed)
DECREASE Amiodarone to 200 mg ,one tab daily  Your physician recommends that you schedule a follow-up appointment in: 4 weeks with Dr.Bensimhon  Do the following things EVERYDAY: 1) Weigh yourself in the morning before breakfast. Write it down and keep it in a log. 2) Take your medicines as prescribed 3) Eat low salt foods-Limit salt (sodium) to 2000 mg per day.  4) Stay as active as you can everyday 5) Limit all fluids for the day to less than 2 liters 6)

## 2015-10-02 NOTE — Patient Outreach (Addendum)
[facility]) Care Management  10/02/2015  [name] Oct 04, 1945 [id number]   Assessment: Transition of care follow-up- 3rd attempt Unable to receive any call back to messages left on patient's voice mailbox. Call placed to speak with patient but unable to reach her. HIPAA complaint voice message left with name and contact information.   Plan: Will await for a return call. If unable to receive a call back, will attempt to reach patient at another time.  If still no response from patient, will send a Patient Outreach Letter.   Braylen Staller A. Myrle Wanek, BSN, RN-BC [facility] Management Coordinator Cell: [phone]

## 2015-10-02 NOTE — Progress Notes (Signed)
Patient ID: [name], female   DOB: May 22, 1946, 70 y.o.   MRN: [id number]  PCP: Primary HF Cardiologist: Dr Haroldine Laws EP: Dr Lovena Le.  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 Jude ICD 04/2015, & chronic diastolic CHF (EF 0000000 by echo, 45-50% by cath).   Admitted 09/17/15 with new rapid aflutter (previously PAF/VT) with plans for cardioversion. She was also noted to be volume overload in setting of non-compliance (thought to be dietary). Had TEE/DC-CV with return to NSR. She developed laryngospasm and required brief intubation but recovered in PACU post procedure. Enrolled in the ReDS vest trial,  Reading 53%. She continued to diurese with IV lasix and transitioned to lasix 40 mg twice a day. On the day of discharge Reds Vest reading was 39. Discharge weight was 160 pounds.   Today she returns for post hospital follow up. Overall feeling ok. Complaining of RUE discomfort from ecchymotic area. Denies SOB/PND/Orthopnea. Weight at home has been 158-159 pounds. Taking all medications. Tries to follow low salt diet and limit fluid intake to < 2 liters per day.   09/2015 TEE EF 35-40% No evidence of thrombus  09/18/2015 ECHO EF 45-50%   Labs 09/24/2015: K 4.1 Creatinine 1.33    ROS: All systems negative except as listed in HPI, PMH and Problem List.  SH:  Social History   Social History  . Marital Status: Single    Spouse Name: N/A  . Number of Children: N/A  . Years of Education: N/A   Occupational History  . Not on file.   Social History Main Topics  . Smoking status: Former Smoker -- 4 years    Quit date: 12/31/1980  . Smokeless tobacco: Never Used  . Alcohol Use: No  . Drug Use: No  . Sexual Activity: Not Currently   Other Topics Concern  . Not on file   Social  History Narrative   Divorced.  Lives with daughter and grandson. 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    FH:  Family History  Problem Relation Age of Onset  . Breast cancer Mother   . Diabetes Father   . Stroke Father   . Heart disease Sister   . Healthy Sister     Past Medical History  Diagnosis Date  . Diabetes mellitus without complication ([place])     dx on wed  . Paroxysmal atrial fibrillation ([name])   . Hypertrophic cardiomyopathy ([place])   . Left atrial enlargement   . Tricuspid regurgitation     a. Echo 04/2015: Mod-severe TR. b. Not mentioned on echo 05/2015  . Ventricular tachycardia ([place])     a. s/p St. Jude ICD implanted 04/2015.  [name] CAD (coronary artery disease), native coronary artery - 3 vessel 04/21/2015    a. NSTEMI 8-04/2015 felt 2/2 demand ischemia. b. 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  . Abnormal PFT   . Chronic diastolic CHF (congestive heart failure) ([place])   . Mitral regurgitation     a. Echo 04/2015: moderate mitral regurgitation. b. F/u echo 05/2015: mild MR.  . Diabetes mellitus ([place])   . Shortness of breath dyspnea     Current Outpatient Prescriptions  Medication Sig Dispense Refill  . acetaminophen (TYLENOL) 325 MG tablet Take 2 tablets (  650 mg total) by mouth every 4 (four) hours as needed for headache or mild pain.    [name] amiodarone (PACERONE) 200 MG tablet Take 1 tablet (200 mg total) by mouth 2 (two) times daily. 60 tablet 11  . atorvastatin (LIPITOR) 40 MG tablet Take 1 tablet (40 mg total) by mouth daily at 6 PM. 30 tablet 3  . bisoprolol (ZEBETA) 5 MG tablet Take 1 tablet (5 mg total) by mouth daily. 30 tablet 11  . Blood Glucose Monitoring Suppl (TRUE METRIX METER) DEVI 1 each by Does not apply route 2 (two) times daily. 1 Device 0  . diltiazem (CARDIZEM CD) 120 MG 24 hr capsule Take 1 capsule (120 mg total) by mouth daily. 30 capsule 1  . furosemide (LASIX) 40 MG tablet Take 1 tablet (40  mg total) by mouth 2 (two) times daily. 60 tablet 0  . glipiZIDE (GLUCOTROL) 5 MG tablet Take 5 mg by mouth daily.    [name] glucose blood (TRUE METRIX BLOOD GLUCOSE TEST) test strip Use as instructed twice daily 100 each 12  . levothyroxine (SYNTHROID, LEVOTHROID) 50 MCG tablet TAKE 1 TABLET BY MOUTH DAILY 30 tablet 2  . metFORMIN (GLUCOPHAGE) 500 MG tablet Take 1 tablet (500 mg total) by mouth daily with breakfast. 60 tablet 3  . rivaroxaban (XARELTO) 20 MG TABS tablet Take 1 tablet (20 mg total) by mouth daily. 90 tablet 3  . spironolactone (ALDACTONE) 25 MG tablet Take 0.5 tablets (12.5 mg total) by mouth daily. 15 tablet 6  . TRUEPLUS LANCETS 28G MISC Use as instructed twice daily. 60 each 5  . traZODone (DESYREL) 50 MG tablet Take 1 tablet (50 mg total) by mouth at bedtime as needed for sleep. (Patient not taking: Reported on 10/02/2015) 30 tablet 0   No current facility-administered medications for this encounter.    Filed Vitals:   10/02/15 1113  BP: 120/66  Pulse: 60  Height: 5\' 4"  (1.626 m)  Weight: 158 lb 3.2 oz (71.759 kg)  SpO2: 97%    PHYSICAL EXAM:  General:  Well appearing. No resp difficulty. Ambulated in the clinic with a cane.  HEENT: normal Neck: supple. JVP flat. Carotids 2+ bilaterally; no bruits. No lymphadenopathy or thryomegaly appreciated. Cor: PMI normal. Regular rate & rhythm. No rubs, gallops or murmurs. Lungs: clear Abdomen: soft, nontender, nondistended. No hepatosplenomegaly. No bruits or masses. Good bowel sounds. Extremities: no cyanosis, clubbing, rash, edema. RUE ecchymotic no induration or edema. Sensation intact.  Neuro: alert & orientedx3, cranial nerves grossly intact. Moves all 4 extremities w/o difficulty. Affect pleasant.   ECG: NSR 60 bpm PR interval 260 ms.    ASSESSMENT & PLAN: 1. Chronic Combined HF- EF TEE 09/2015 35-40% Primarily Ischemic CM NYHA II-III. Volume status stable. Todays Reds Vest reading is 39%. Continue lasix 40 mg twice a  day + 12.5 mg spiro daily.  Continue bisoprolol 5 mg daily Next visit will consider add losartan.  Check BMET today.  Today I reinforced low salt food choices, limiting fluid intake to < 2 liters per day.  2. PAF- S/P DC-CV 09/20/15. Today she is maintaining NSR. Cut back amio to 200 mg daily. PR interval 260 ms. On cardizem 120 mg daily. May need to stop if EF does not improve. On Xarelto.  CAD- 3V CAD by cath 9/20160 on Statin , BB, Xarelto. Needs to follow up with Dr C. No evidence of ischemia.  3. H/O VT with ICD placed 04/2015.  4. RUE pain- Not sure  if the is related to ecchymotic area. If persists I have asked her to follow up with PCP.   Follow up in 4 weeks.   Amy Clegg NP-C  1:20 PM

## 2015-10-04 ENCOUNTER — Other Ambulatory Visit: Payer: Self-pay | Admitting: *Deleted

## 2015-10-04 ENCOUNTER — Encounter: Payer: Self-pay | Admitting: Internal Medicine

## 2015-10-04 ENCOUNTER — Encounter (HOSPITAL_COMMUNITY): Payer: PPO | Admitting: Internal Medicine

## 2015-10-04 ENCOUNTER — Ambulatory Visit (INDEPENDENT_AMBULATORY_CARE_PROVIDER_SITE_OTHER): Payer: PPO | Admitting: Internal Medicine

## 2015-10-04 VITALS — BP 118/86 | HR 60 | Ht 64.0 in | Wt 160.0 lb

## 2015-10-04 DIAGNOSIS — I5042 Chronic combined systolic (congestive) and diastolic (congestive) heart failure: Secondary | ICD-10-CM

## 2015-10-04 DIAGNOSIS — I422 Other hypertrophic cardiomyopathy: Secondary | ICD-10-CM | POA: Diagnosis not present

## 2015-10-04 LAB — CUP PACEART INCLINIC DEVICE CHECK
Brady Statistic RA Percent Paced: 77 %
HIGH POWER IMPEDANCE MEASURED VALUE: 57.375
Implantable Lead Implant Date: 20160912
Implantable Lead Location: 753860
Implantable Lead Model: 7122
Lead Channel Impedance Value: 487.5 Ohm
Lead Channel Pacing Threshold Amplitude: 0.75 V
Lead Channel Pacing Threshold Amplitude: 0.75 V
Lead Channel Pacing Threshold Amplitude: 0.75 V
Lead Channel Pacing Threshold Pulse Width: 0.5 ms
Lead Channel Pacing Threshold Pulse Width: 0.5 ms
Lead Channel Pacing Threshold Pulse Width: 0.5 ms
Lead Channel Sensing Intrinsic Amplitude: 4.5 mV
MDC IDC LEAD IMPLANT DT: 20160912
MDC IDC LEAD LOCATION: 753859
MDC IDC MSMT BATTERY REMAINING LONGEVITY: 84 mo
MDC IDC MSMT LEADCHNL RA PACING THRESHOLD AMPLITUDE: 0.75 V
MDC IDC MSMT LEADCHNL RA PACING THRESHOLD PULSEWIDTH: 0.5 ms
MDC IDC MSMT LEADCHNL RV IMPEDANCE VALUE: 462.5 Ohm
MDC IDC MSMT LEADCHNL RV SENSING INTR AMPL: 11.9 mV
MDC IDC PG SERIAL: 7279969
MDC IDC SESS DTM: 20170302133717
MDC IDC SET LEADCHNL RA PACING AMPLITUDE: 2 V
MDC IDC SET LEADCHNL RV PACING AMPLITUDE: 2.5 V
MDC IDC SET LEADCHNL RV PACING PULSEWIDTH: 0.5 ms
MDC IDC SET LEADCHNL RV SENSING SENSITIVITY: 0.5 mV
MDC IDC STAT BRADY RV PERCENT PACED: 11 %

## 2015-10-04 NOTE — Patient Outreach (Signed)
[name]) Care Management  10/04/2015  [name] [birth year] [id number]   Assessment: Transition of care follow-up call Unable to receive any call back from patient to messages left on her voice mailbox. Call placed to speak with patient but still no answer. HIPAA compliant voice message left with name and contact information.  Call placed to emergency contact/ daughter [name]) and was able to speak to her. Care management coordinator introduced self and explained purpose of the call. Identity verified using two identifiers.  Patient's daughter reports that patient is not at home at this time, however, they live together.  According to daughter, she is providing some assistance and care to patient after hospital discharge. Daughter states that patient's phone might not be working- that makes her not able to return calls to care Nurse, children's. Daughter states that patient could be called on daughter's phone number.  Patient's daughter informs care management coordinator that it would be better to speak with patient once she comes back. Care management coordinator's contact information taken by daughter and she states will relay message to patient and advise to give a call back to care management coordinator once she is back home.  Plan: Will await for patient's call. If unable to receive a return call, will attempt to reach patient on daughter's phone number.  [name], BSN, RN-BC [place] Management Coordinator Cell: [phone]

## 2015-10-04 NOTE — Progress Notes (Signed)
HPI [name] returns today for followup. She is a pleasant 70 yo woman with apical HCM who was admitted with atypical atrial flutter and CHF, and underwent TEE guided DCCV. She had volume overload and underwent diuresis and returns today for followup. No ICD shocks. She has been on amiodarone for rhythm control and treatment of VT. She has done well since her admit. No Known Allergies   Current Outpatient Prescriptions  Medication Sig Dispense Refill  . acetaminophen (TYLENOL) 325 MG tablet Take 2 tablets (650 mg total) by mouth every 4 (four) hours as needed for headache or mild pain.    [name] amiodarone (PACERONE) 200 MG tablet Take 1 tablet (200 mg total) by mouth daily. 30 tablet 11  . atorvastatin (LIPITOR) 40 MG tablet Take 1 tablet (40 mg total) by mouth daily at 6 PM. 30 tablet 3  . bisoprolol (ZEBETA) 5 MG tablet Take 1 tablet (5 mg total) by mouth daily. 30 tablet 11  . Blood Glucose Monitoring Suppl (TRUE METRIX METER) DEVI 1 each by Does not apply route 2 (two) times daily. 1 Device 0  . diltiazem (CARDIZEM CD) 120 MG 24 hr capsule Take 1 capsule (120 mg total) by mouth daily. 30 capsule 1  . furosemide (LASIX) 40 MG tablet Take 1 tablet (40 mg total) by mouth 2 (two) times daily. 60 tablet 0  . glipiZIDE (GLUCOTROL) 5 MG tablet Take 5 mg by mouth daily.    [name] glucose blood (TRUE METRIX BLOOD GLUCOSE TEST) test strip Use as instructed twice daily 100 each 12  . levothyroxine (SYNTHROID, LEVOTHROID) 50 MCG tablet TAKE 1 TABLET BY MOUTH DAILY 30 tablet 2  . metFORMIN (GLUCOPHAGE) 500 MG tablet Take 1 tablet (500 mg total) by mouth daily with breakfast. 60 tablet 3  . rivaroxaban (XARELTO) 20 MG TABS tablet Take 1 tablet (20 mg total) by mouth daily. 90 tablet 3  . spironolactone (ALDACTONE) 25 MG tablet Take 0.5 tablets (12.5 mg total) by mouth daily. 15 tablet 6  . traZODone (DESYREL) 50 MG tablet Take 1 tablet (50 mg total) by mouth at bedtime as needed for sleep. 30 tablet 0   . TRUEPLUS LANCETS 28G MISC Use as instructed twice daily. 60 each 5   No current facility-administered medications for this visit.     Past Medical History  Diagnosis Date  . Diabetes mellitus without complication ([place])     dx on wed  . Paroxysmal atrial fibrillation ([name])   . Hypertrophic cardiomyopathy ([place])   . Left atrial enlargement   . Tricuspid regurgitation     a. Echo 04/2015: Mod-severe TR. b. Not mentioned on echo 05/2015  . Ventricular tachycardia ([place])     a. s/p St. Jude ICD implanted 04/2015.  [name] CAD (coronary artery disease), native coronary artery - 3 vessel 04/21/2015    a. NSTEMI 8-04/2015 felt 2/2 demand ischemia. b. 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  . Abnormal PFT   . Chronic diastolic CHF (congestive heart failure) ([place])   . Mitral regurgitation     a. Echo 04/2015: moderate mitral regurgitation. b. F/u echo 05/2015: mild MR.  . Diabetes mellitus ([place])   . Shortness of breath dyspnea     ROS:   All systems reviewed and negative except as noted in the HPI.   Past Surgical History  Procedure Laterality Date  . Dental surgery  Oct. 2011    several extractions,  bone graft  . I&d extremity Left 01/06/2013    Procedure: IRRIGATION AND DEBRIDEMENT LEFT ELBOW AND LEFT FOREARM ;  Surgeon: [name], MD;  Location: [facility];  Service: Orthopedics;  Laterality: Left;  . Application of wound vac Left 01/06/2013    Procedure: APPLICATION OF WOUND VAC X 2;  Surgeon: [name], MD;  Location: [facility];  Service: Orthopedics;  Laterality: Left;  left forearm  . Incision and drainage Left 01/09/2013    Procedure: REDO INCISION AND DRAINAGE LEFT ELBOW;  Surgeon: [name], MD;  Location: [facility];  Service: Orthopedics;  Laterality: Left;  SUPINE, UPPER EXTERMITY DRAPE  . Secondary closure of wound Left 01/09/2013    Procedure: SECONDARY CLOSURE OF WOUND  LEFT ELBOW;  Surgeon: [name], MD;  Location:  [facility];  Service: Orthopedics;  Laterality: Left;  . Application of wound vac Left 01/09/2013    Procedure: APPLICATION OF WOUND VAC;  Surgeon: [name], MD;  Location: [facility];  Service: Orthopedics;  Laterality: Left;  . Ep implantable device N/A 04/16/2015    Procedure: ICD Implant;  Surgeon: [name], MD;  Location: [place] [facility];  Service: Cardiovascular;  Laterality: N/A;  . Cardiac catheterization N/A 04/19/2015    Procedure: Left Heart Cath and Coronary Angiography;  Surgeon: [name], MD;  Location: [place] [facility];  Service: Cardiovascular;  Laterality: N/A;  . Tee without cardioversion N/A 09/20/2015    Procedure: TRANSESOPHAGEAL ECHOCARDIOGRAM (TEE);  Surgeon: Pixie Casino, MD;  Location: [place];  Service: Cardiovascular;  Laterality: N/A;  . Cardioversion N/A 09/20/2015    Procedure: CARDIOVERSION;  Surgeon: Pixie Casino, MD;  Location: [facility];  Service: Cardiovascular;  Laterality: N/A;     Family History  Problem Relation Age of Onset  . Breast cancer Mother   . Diabetes Father   . Stroke Father   . Heart disease Sister   . Healthy Sister      Social History   Social History  . Marital Status: Single    Spouse Name: N/A  . Number of Children: N/A  . Years of Education: N/A   Occupational History  . Not on file.   Social History Main Topics  . Smoking status: Former Smoker -- 4 years    Quit date: 12/31/1980  . Smokeless tobacco: Never Used  . Alcohol Use: No  . Drug Use: No  . Sexual Activity: Not Currently   Other Topics Concern  . Not on file   Social History Narrative   Divorced.  Lives with daughter and grandson. 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     BP 118/86 mmHg  Pulse 60  Ht 5\' 4"  (1.626 m)  Wt 160 lb (72.576 kg)  BMI 27.45 kg/m2  Physical Exam:  Well appearing NAD HEENT: Unremarkable Neck:  No JVD, no thyromegally Lymphatics:  No adenopathy Back:  No CVA tenderness Lungs:  Clear  with no wheezes HEART:  Regular rate rhythm, no murmurs, no rubs, no clicks Abd:  soft, positive bowel sounds, no organomegally, no rebound, no guarding Ext:  2 plus pulses, no edema, no cyanosis, no clubbing Skin:  No rashes no nodules Neuro:  CN II through XII intact, motor grossly intact  EKG - nsr with atrial pacing  DEVICE  Normal device function.  See PaceArt for details.   Assess/Plan: 1. Chronic systolic/diastolic heart failure - she appears euvolemic and has been followed by  Dr. Reine Just in CHF clinic. We will ask her to follow up with our home monitoring. 2. Atrial flutter - her ECG demonstrates that she has atypical flutter. She will continue her amiodarone 3. VT - she is maintaining NSR. She will continue amio 4. HTN - her blood pressure is well controlled. No change in her meds. I have encouraged her to maintain a low sodium diet.  Mikle Bosworth.D.

## 2015-10-04 NOTE — Patient Instructions (Signed)
Medication Instructions:  Your physician recommends that you continue on your current medications as directed. Please refer to the Current Medication list given to you today.   Labwork: None ordered   Testing/Procedures: None ordered   Follow-Up: Your physician wants you to follow-up in: 6 months with Dr Knox Saliva will receive a reminder letter in the mail two months in advance. If you don't receive a letter, please call our office to schedule the follow-up appointment.  Remote monitoring is used to monitor your ICD from home. This monitoring reduces the number of office visits required to check your device to one time per year. It allows [place] to keep an eye on the functioning of your device to ensure it is working properly. You are scheduled for a device check from home on 01/02/06. You may send your transmission at any time that day. If you have a wireless device, the transmission will be sent automatically. After your physician reviews your transmission, you will receive a postcard with your next transmission date.  Sharman Cheek, RN with ICM clinic will follow monthly     Any Other Special Instructions Will Be Listed Below (If Applicable).     If you need a refill on your cardiac medications before your next appointment, please call your pharmacy.

## 2015-10-04 NOTE — Telephone Encounter (Signed)
Spoke with patient and grandson during office visit with Dr Lovena Le.  Explained ICM program and she agreed to monthly calls.   Grandson will set up home monitor and send ICM remote transmission today.  Will follow up with patient to confirm transmission was sent.

## 2015-10-05 ENCOUNTER — Other Ambulatory Visit: Payer: Self-pay | Admitting: *Deleted

## 2015-10-05 NOTE — Patient Outreach (Addendum)
[facility] [facility]) Care Management  10/05/2015  [name] 11/09/1945 [id number]   Assessment: Transition of care - follow-up call (attempt 4) Unable to receive any call back from patient to message left with daughter yesterday with name and contact information. Call placed to daughter's phone number to attempt to reach patient but still unable to reach her. HIPAA compliant voice message left with name and contact number.    Plan: Will await for patient or daughter's call back. If unable to receive a return call, will send a Patient Outreach Letter and await for response.  [name] A. Macintyre Alexa, BSN, RN-BC [place] Management Coordinator Cell: [phone]

## 2015-10-09 ENCOUNTER — Other Ambulatory Visit: Payer: Self-pay | Admitting: *Deleted

## 2015-10-09 ENCOUNTER — Encounter: Payer: Self-pay | Admitting: *Deleted

## 2015-10-09 NOTE — Patient Outreach (Signed)
[facility]) Care Management  10/09/2015  [name] [birth year] [id number]   Assessment: Care coordination Unable to receive any call back from patient or daughter to messages left on their voice mailboxes. Unable to receive any response from patient to call made on her daughter's phone number in attempt to contact her.   Plan: Will send a Patient Outreach Letter and will await for response. If unable to receive any reply in 10 business days, then, will close case.  [name] A. Vernee Baines, BSN, RN-BC [place] Management Coordinator Cell: [phone]

## 2015-10-15 ENCOUNTER — Ambulatory Visit: Payer: PPO | Admitting: Nurse Practitioner

## 2015-10-16 MED FILL — ?METFORMIN HCL 500MG TABLET: 500 | 30 days supply | Qty: 30 | Fill #4

## 2015-10-16 MED FILL — LEVOTHYROXINE 50 MCG TABLET: 50 | 30 days supply | Qty: 30 | Fill #1 | Status: TO

## 2015-10-18 MED FILL — dilTIAZem CD 120 MG CP24: 120 | 30 days supply | Qty: 30 | Fill #1

## 2015-10-18 MED FILL — ?ATORVASTATIN 40MG TABLET: 40 | 30 days supply | Qty: 30 | Fill #5

## 2015-10-18 MED FILL — ?GLIPIZIDE 5MG TABLET: 5 | 30 days supply | Qty: 60 | Fill #1

## 2015-10-18 MED FILL — AMIODARONE HCL 200 MG TAB: 200 | 30 days supply | Qty: 60 | Fill #1

## 2015-10-18 NOTE — Telephone Encounter (Signed)
Attempted call to patient for ICM transmission follow up.  Left message for patient to return call.

## 2015-10-19 MED FILL — ?SPIRONOLACTONE 25 MG TABLE: 25 | 30 days supply | Qty: 15 | Fill #0

## 2015-10-23 ENCOUNTER — Other Ambulatory Visit: Payer: Self-pay | Admitting: *Deleted

## 2015-10-23 ENCOUNTER — Encounter: Payer: Self-pay | Admitting: *Deleted

## 2015-10-23 NOTE — Patient Outreach (Signed)
[name]) Care Management  10/23/2015  [name] 03-Nov-1945 [id number]  Assessment: Care coordination/ Case closure Unable to receive any call back from patient or daughter or any reply to Patient Outreach Letter sent to patient on 10/09/15. Patient has signed consent while in the hospital but has not responded to subsequent attempts to reach her.  Plan: Will close case due to patient voluntarily withdrawn from the program. Will notify primary care provider of case closure.  [name], BSN, RN-BC [place] Management Coordinator Cell: [phone]

## 2015-10-26 ENCOUNTER — Telehealth: Payer: Self-pay

## 2015-10-26 NOTE — Telephone Encounter (Signed)
error 

## 2015-10-26 NOTE — Telephone Encounter (Signed)
Spoke with patient.   Attempted to assist patient's grandson, [name], in setting up home monitor for ICM remote transmissions and monitor continued to beep.  Provided [employer] tech support number and advised to call for troubleshooting.  Explained importance of keeping remote monitor plugged in by bedside.  Will follow up to see if successful in sending transmission.

## 2015-10-29 MED FILL — FUROSEMIDE 40 MG TABLET: 40 | 30 days supply | Qty: 30 | Fill #1

## 2015-10-29 MED FILL — XARELTO 20 MG TABLET: 20 | 30 days supply | Qty: 30 | Fill #4

## 2015-10-30 ENCOUNTER — Ambulatory Visit (HOSPITAL_COMMUNITY)
Admission: RE | Admit: 2015-10-30 | Discharge: 2015-10-30 | Disposition: A | Discharge status: Home / Self Care | Payer: PPO | Source: Ambulatory Visit | Attending: Internal Medicine | Admitting: Internal Medicine

## 2015-10-30 ENCOUNTER — Telehealth (HOSPITAL_COMMUNITY): Payer: Self-pay

## 2015-10-30 ENCOUNTER — Encounter (HOSPITAL_COMMUNITY): Payer: Self-pay | Admitting: Internal Medicine

## 2015-10-30 VITALS — BP 110/70 | HR 60 | Wt 159.0 lb

## 2015-10-30 DIAGNOSIS — Z823 Family history of stroke: Secondary | ICD-10-CM | POA: Insufficient documentation

## 2015-10-30 DIAGNOSIS — Z833 Family history of diabetes mellitus: Secondary | ICD-10-CM | POA: Insufficient documentation

## 2015-10-30 DIAGNOSIS — Z95810 Presence of automatic (implantable) cardiac defibrillator: Secondary | ICD-10-CM | POA: Diagnosis not present

## 2015-10-30 DIAGNOSIS — E119 Type 2 diabetes mellitus without complications: Secondary | ICD-10-CM | POA: Insufficient documentation

## 2015-10-30 DIAGNOSIS — I252 Old myocardial infarction: Secondary | ICD-10-CM | POA: Insufficient documentation

## 2015-10-30 DIAGNOSIS — Z7901 Long term (current) use of anticoagulants: Secondary | ICD-10-CM | POA: Diagnosis not present

## 2015-10-30 DIAGNOSIS — Z87891 Personal history of nicotine dependence: Secondary | ICD-10-CM | POA: Insufficient documentation

## 2015-10-30 DIAGNOSIS — I5042 Chronic combined systolic (congestive) and diastolic (congestive) heart failure: Secondary | ICD-10-CM | POA: Diagnosis not present

## 2015-10-30 DIAGNOSIS — Z7984 Long term (current) use of oral hypoglycemic drugs: Secondary | ICD-10-CM | POA: Insufficient documentation

## 2015-10-30 DIAGNOSIS — R5383 Other fatigue: Secondary | ICD-10-CM | POA: Diagnosis not present

## 2015-10-30 DIAGNOSIS — I2510 Atherosclerotic heart disease of native coronary artery without angina pectoris: Secondary | ICD-10-CM | POA: Insufficient documentation

## 2015-10-30 DIAGNOSIS — R5382 Chronic fatigue, unspecified: Secondary | ICD-10-CM

## 2015-10-30 DIAGNOSIS — Z79899 Other long term (current) drug therapy: Secondary | ICD-10-CM | POA: Insufficient documentation

## 2015-10-30 DIAGNOSIS — I255 Ischemic cardiomyopathy: Secondary | ICD-10-CM | POA: Insufficient documentation

## 2015-10-30 DIAGNOSIS — Z8249 Family history of ischemic heart disease and other diseases of the circulatory system: Secondary | ICD-10-CM | POA: Insufficient documentation

## 2015-10-30 DIAGNOSIS — I480 Paroxysmal atrial fibrillation: Secondary | ICD-10-CM | POA: Diagnosis not present

## 2015-10-30 LAB — BASIC METABOLIC PANEL
ANION GAP: 12 (ref 5–15)
BUN: 55 mg/dL — ABNORMAL HIGH (ref 6–20)
CALCIUM: 9.9 mg/dL (ref 8.9–10.3)
CO2: 22 mmol/L (ref 22–32)
Chloride: 105 mmol/L (ref 101–111)
Creatinine, Ser: 1.99 mg/dL — ABNORMAL HIGH (ref 0.44–1.00)
GFR calc Af Amer: 29 mL/min — ABNORMAL LOW (ref >60)
GFR calc non Af Amer: 25 mL/min — ABNORMAL LOW (ref >60)
GLUCOSE: 133 mg/dL — AB (ref 65–99)
POTASSIUM: 5.6 mmol/L — AB (ref 3.5–5.1)
Sodium: 139 mmol/L (ref 135–145)

## 2015-10-30 LAB — CBC
HEMATOCRIT: 36.6 % (ref 36.0–46.0)
Hemoglobin: 11.7 g/dL — ABNORMAL LOW (ref 12.0–15.0)
MCH: 27.5 pg (ref 26.0–34.0)
MCHC: 32 g/dL (ref 30.0–36.0)
MCV: 86.1 fL (ref 78.0–100.0)
Platelets: 282 10*3/uL (ref 150–400)
RBC: 4.25 MIL/uL (ref 3.87–5.11)
RDW: 17.6 % — AB (ref 11.5–15.5)
WBC: 9.3 10*3/uL (ref 4.0–10.5)

## 2015-10-30 MED ORDER — BISOPROLOL FUMARATE 5 MG PO TABS
5.0000 mg | ORAL_TABLET | Freq: Every day | ORAL | Status: DC
Start: 2015-10-30 — End: 2016-01-02

## 2015-10-30 MED ORDER — FUROSEMIDE 40 MG PO TABS: 40.0000 mg | ORAL_TABLET | Freq: Two times a day (BID) | ORAL | Status: DC

## 2015-10-30 MED ORDER — LOSARTAN POTASSIUM 25 MG PO TABS: 25.0000 mg | ORAL_TABLET | Freq: Every day | ORAL | Status: DC

## 2015-10-30 NOTE — Patient Instructions (Addendum)
START Losartan 25 mg tablet once every evening.  Routine lab work today. Will notify you of abnormal results, otherwise no news is good news!  Will refer you to [facility].  Follow up 2 months with Dr. Haroldine Laws.  Do the following things EVERYDAY: 1) Weigh yourself in the morning before breakfast. Write it down and keep it in a log. 2) Take your medicines as prescribed 3) Eat low salt foods-Limit salt (sodium) to 2000 mg per day.  4) Stay as active as you can everyday 5) Limit all fluids for the day to less than 2 liters

## 2015-10-30 NOTE — Progress Notes (Signed)
Patient ID: [name], female   DOB: Jul 30, 1946, 70 y.o.   MRN: [id number]    Advanced Heart Failure Clinic Note   PCP: Primary HF Cardiologist: Dr Haroldine Laws EP: Dr Lovena Le.  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 Jude ICD 04/2015, & chronic diastolic CHF (EF 0000000 by echo, 45-50% by cath).   Admitted 09/17/15 with new rapid aflutter (previously PAF/VT) with plans for cardioversion. She was also noted to be volume overload in setting of non-compliance (thought to be dietary). Had TEE/DC-CV with return to NSR. She developed laryngospasm and required brief intubation but recovered in PACU post procedure. Enrolled in the ReDS vest trial,  Reading 53%. She continued to diurese with IV lasix and transitioned to lasix 40 mg twice a day. On the day of discharge Reds Vest reading was 39. Discharge weight was 160 pounds.   Today she returns for regular follow up. Weight relatively stable from last visit. Overall was feeling OK until this morning.  Feels nauseated with small amount of vomiting. Feeling slightly better, but still on an empty stomach.  Weight at home stable157-160. Denies PND/Orthopnea. No SOB usually with ADLS.  Still has fatigue, has to take a break after standing for 20-30 minutes. Of note, she has been off of her bisoprolol for several weeks. Tries to follow low salt diet and limit fluid intake to < 2 liters per day. She denies fevers, chills, lightheadedness, or dizziness. Mild snoring but not severe.   09/2015 TEE EF 35-40% No evidence of thrombus  09/18/2015 ECHO EF 45-50%   Labs 09/24/2015: K 4.1 Creatinine 1.33    ROS: All systems negative except as listed in HPI, PMH and Problem List.  SH:  Social History   Social History  . Marital Status: Single    Spouse Name:  N/A  . Number of Children: N/A  . Years of Education: N/A   Occupational History  . Not on file.   Social History Main Topics  . Smoking status: Former Smoker -- 4 years    Quit date: 12/31/1980  . Smokeless tobacco: Never Used  . Alcohol Use: No  . Drug Use: No  . Sexual Activity: Not Currently   Other Topics Concern  . Not on file   Social History Narrative   Divorced.  Lives with daughter and grandson. 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    FH:  Family History  Problem Relation Age of Onset  . Breast cancer Mother   . Diabetes Father   . Stroke Father   . Heart disease Sister   . Healthy Sister     Past Medical History  Diagnosis Date  . Diabetes mellitus without complication ([place])     dx on wed  . Paroxysmal atrial fibrillation ([name])   . Hypertrophic cardiomyopathy ([place])   . Left atrial enlargement   . Tricuspid regurgitation     a. Echo 04/2015: Mod-severe TR. b. Not mentioned on echo 05/2015  . Ventricular tachycardia ([place])     a. s/p St. Jude ICD implanted 04/2015.  [name] CAD (coronary artery disease), native coronary artery - 3 vessel 04/21/2015    a. NSTEMI 8-04/2015 felt 2/2 demand ischemia. b. 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  . Abnormal  PFT   . Chronic diastolic CHF (congestive heart failure) ([place])   . Mitral regurgitation     a. Echo 04/2015: moderate mitral regurgitation. b. F/u echo 05/2015: mild MR.  . Diabetes mellitus ([place])   . Shortness of breath dyspnea     Current Outpatient Prescriptions  Medication Sig Dispense Refill  . amiodarone (PACERONE) 200 MG tablet Take 1 tablet (200 mg total) by mouth daily. 30 tablet 11  . atorvastatin (LIPITOR) 40 MG tablet Take 1 tablet (40 mg total) by mouth daily at 6 PM. 30 tablet 3  . Blood Glucose Monitoring Suppl (TRUE METRIX METER) DEVI 1 each by Does not apply route 2 (two) times daily. 1 Device 0  . diltiazem (CARDIZEM CD) 120 MG 24 hr  capsule Take 1 capsule (120 mg total) by mouth daily. 30 capsule 1  . furosemide (LASIX) 40 MG tablet Take 1 tablet (40 mg total) by mouth 2 (two) times daily. 60 tablet 0  . glipiZIDE (GLUCOTROL) 5 MG tablet Take 5 mg by mouth daily.    [name] glucose blood (TRUE METRIX BLOOD GLUCOSE TEST) test strip Use as instructed twice daily 100 each 12  . levothyroxine (SYNTHROID, LEVOTHROID) 50 MCG tablet TAKE 1 TABLET BY MOUTH DAILY 30 tablet 2  . metFORMIN (GLUCOPHAGE) 500 MG tablet Take 1 tablet (500 mg total) by mouth daily with breakfast. 60 tablet 3  . rivaroxaban (XARELTO) 20 MG TABS tablet Take 1 tablet (20 mg total) by mouth daily. 90 tablet 3  . spironolactone (ALDACTONE) 25 MG tablet Take 0.5 tablets (12.5 mg total) by mouth daily. 15 tablet 6  . TRUEPLUS LANCETS 28G MISC Use as instructed twice daily. 60 each 5  . acetaminophen (TYLENOL) 325 MG tablet Take 2 tablets (650 mg total) by mouth every 4 (four) hours as needed for headache or mild pain. (Patient not taking: Reported on 10/30/2015)    . bisoprolol (ZEBETA) 5 MG tablet Take 1 tablet (5 mg total) by mouth daily. 30 tablet 11  . traZODone (DESYREL) 50 MG tablet Take 1 tablet (50 mg total) by mouth at bedtime as needed for sleep. (Patient not taking: Reported on 10/30/2015) 30 tablet 0   No current facility-administered medications for this encounter.    Filed Vitals:   10/30/15 1048  BP: 110/70  Pulse: 60  Weight: 159 lb (72.122 kg)  SpO2: 97%   Wt Readings from Last 3 Encounters:  10/30/15 159 lb (72.122 kg)  10/04/15 160 lb (72.576 kg)  10/02/15 158 lb 3.2 oz (71.759 kg)     PHYSICAL EXAM:  General:  Well appearing. No resp difficulty. Ambulated in the clinic with a cane.  HEENT: normal Neck: supple. JVP flat. Carotids 2+ bilaterally; no bruits. No thyromegaly or nodule noted Cor: PMI normal. RRR, No M/G/R Lungs: clear, normal effort Abdomen: soft, NT, ND, no HSM. No bruits or masses. +BS  Extremities: no cyanosis, clubbing,  rash, edema. RUE ecchymotic no induration or edema. Sensation intact.  Neuro: alert & orientedx3, cranial nerves grossly intact. Moves all 4 extremities w/o difficulty. Affect pleasant.  ASSESSMENT & PLAN: 1. Chronic Combined HF- EF TEE 09/2015 35-40% Primarily Ischemic CM NYHA II-III.  - Volume status stable on exam.  - Continue lasix 40 mg twice a day + 12.5 mg spiro daily.  - Off bisoprolol due to fatigue Will not add losartan with ongoing fatigue and relatively soft BP off of bisoprolol.  Re-check BMET today.  Reinforced low salt food choices, limiting fluid intake to <  2 liters per day.  2. PAF- S/P DC-CV 09/20/15. Today she is maintaining NSR. On Xarelto. Follows with Dr. Lovena Le 3. CAD- 3V CAD by cath 9/20160 on Statin , BB, Xarelto. Needs to follow up with Dr C. No evidence of ischemia.  4. H/O VT with ICD placed 04/2015.  5. Fatigue 6. DM2   Follow up in 1 month.    Shirley Friar PA-C 11:07 AM  Patient seen and examined with Oda Kilts, PA-C. We discussed all aspects of the encounter. I agree with the assessment and plan as stated above.   Heart failure very stable. Volume status looks good. Given EF 45-50% and fatigue can keep off bisoprolol. Doubt fatigue related to HF. TSH and Hgb looked fine. Will refer to [facility] exercise program. With DM would start losartan 25 qhs. Labs today.   Ivone Licht,MD 11:52 AM

## 2015-10-30 NOTE — Addendum Note (Signed)
Encounter addended by: Effie Berkshire, RN on: 10/30/2015 11:58 AM<BR>     Documentation filed: Dx Association, Patient Instructions Section, Orders

## 2015-10-30 NOTE — Telephone Encounter (Signed)
Spoke with [facility] pharmacy about bisoprolol (ZEBETA) 5 MG tablet.  Patient says she has been unable to fill this rx.  Per pharmacy they were able to fill it and have.  Patient informed while in office.

## 2015-11-01 ENCOUNTER — Encounter: Payer: Self-pay | Admitting: *Deleted

## 2015-11-02 ENCOUNTER — Telehealth (HOSPITAL_COMMUNITY): Payer: Self-pay

## 2015-11-02 ENCOUNTER — Telehealth: Payer: Self-pay

## 2015-11-02 NOTE — Telephone Encounter (Signed)
Attempted ICM call to patient and left message for return call.  Patient has not set up home monitor for ICM follow up.

## 2015-11-02 NOTE — Progress Notes (Signed)
No remote remote ICM transmission received after speaking with patient and grandson on 10/26/2015.

## 2015-11-02 NOTE — Telephone Encounter (Signed)
Lab results reviewed with patient. Per Dr. Haroldine Laws, advised to stop spiro, cut out any supplemental potassium/high potassium foods, and cut back lasix to once daily. Will have bmet redrawn 11/12/15 Aware and agreeable to plan.  No questions/concerns at this time.  Renee Pain

## 2015-11-12 ENCOUNTER — Other Ambulatory Visit (HOSPITAL_COMMUNITY): Payer: PPO

## 2015-11-16 NOTE — Telephone Encounter (Signed)
No remote ICM transmission received after attempt to assist patient and grandson with remote monitor.  Unable to enroll in [facility] clinic at this time.

## 2015-11-20 ENCOUNTER — Other Ambulatory Visit: Payer: Self-pay | Admitting: Family Medicine

## 2015-11-20 MED FILL — XARELTO 20 MG TABLET: 20 | 30 days supply | Qty: 30 | Fill #5

## 2015-11-20 MED FILL — ?METFORMIN HCL 500MG TABLET: 500 | 30 days supply | Qty: 30 | Fill #5

## 2015-11-20 MED FILL — ?ATORVASTATIN 40MG TABLET: 40 | 30 days supply | Qty: 30 | Fill #6

## 2015-11-28 ENCOUNTER — Other Ambulatory Visit: Payer: Self-pay | Admitting: Family Medicine

## 2015-11-28 NOTE — Telephone Encounter (Signed)
Patient is requesting a prescription refill for DILTIAZEM 24HR CD 120 MG CA 120 MG CAP]....please follow up with patient

## 2015-12-07 MED FILL — ?FUROSEMIDE 40 MG TABLET: 40 | 30 days supply | Qty: 60 | Fill #0

## 2015-12-10 ENCOUNTER — Other Ambulatory Visit: Payer: Self-pay | Admitting: Physician Assistant

## 2015-12-20 MED FILL — ?ATORVASTATIN 40MG TABLET: 40 | 30 days supply | Qty: 30 | Fill #7

## 2015-12-21 MED FILL — XARELTO 20 MG TABLET: 20 | 30 days supply | Qty: 30 | Fill #6

## 2015-12-21 MED FILL — ?METFORMIN HCL 500MG TABLET: 500 | 30 days supply | Qty: 30 | Fill #6

## 2015-12-21 MED FILL — ?GLIPIZIDE 5MG TABLET: 5 | 30 days supply | Qty: 60 | Fill #2

## 2015-12-28 ENCOUNTER — Telehealth: Payer: Self-pay | Admitting: *Deleted

## 2015-12-28 NOTE — Telephone Encounter (Signed)
I spoke with patient for 23-month phone call for REDS@ Discharge Study.Patient is doing well and has not had any new hospitalizations in last 3 months. I thanked patient for her participation in the study.

## 2016-01-02 ENCOUNTER — Ambulatory Visit (HOSPITAL_COMMUNITY)
Admission: RE | Admit: 2016-01-02 | Discharge: 2016-01-02 | Disposition: A | Discharge status: Home / Self Care | Payer: PPO | Source: Ambulatory Visit | Attending: Internal Medicine | Admitting: Internal Medicine

## 2016-01-02 VITALS — BP 108/56 | HR 65 | Wt 168.8 lb

## 2016-01-02 DIAGNOSIS — Z79899 Other long term (current) drug therapy: Secondary | ICD-10-CM | POA: Insufficient documentation

## 2016-01-02 DIAGNOSIS — I951 Orthostatic hypotension: Secondary | ICD-10-CM | POA: Insufficient documentation

## 2016-01-02 DIAGNOSIS — Z823 Family history of stroke: Secondary | ICD-10-CM | POA: Insufficient documentation

## 2016-01-02 DIAGNOSIS — R5383 Other fatigue: Secondary | ICD-10-CM | POA: Diagnosis not present

## 2016-01-02 DIAGNOSIS — I255 Ischemic cardiomyopathy: Secondary | ICD-10-CM | POA: Diagnosis not present

## 2016-01-02 DIAGNOSIS — Z87891 Personal history of nicotine dependence: Secondary | ICD-10-CM | POA: Insufficient documentation

## 2016-01-02 DIAGNOSIS — Z95810 Presence of automatic (implantable) cardiac defibrillator: Secondary | ICD-10-CM | POA: Insufficient documentation

## 2016-01-02 DIAGNOSIS — I252 Old myocardial infarction: Secondary | ICD-10-CM | POA: Diagnosis not present

## 2016-01-02 DIAGNOSIS — E119 Type 2 diabetes mellitus without complications: Secondary | ICD-10-CM | POA: Insufficient documentation

## 2016-01-02 DIAGNOSIS — I2510 Atherosclerotic heart disease of native coronary artery without angina pectoris: Secondary | ICD-10-CM | POA: Insufficient documentation

## 2016-01-02 DIAGNOSIS — Z7984 Long term (current) use of oral hypoglycemic drugs: Secondary | ICD-10-CM | POA: Insufficient documentation

## 2016-01-02 DIAGNOSIS — I5042 Chronic combined systolic (congestive) and diastolic (congestive) heart failure: Secondary | ICD-10-CM | POA: Insufficient documentation

## 2016-01-02 DIAGNOSIS — I480 Paroxysmal atrial fibrillation: Secondary | ICD-10-CM | POA: Diagnosis not present

## 2016-01-02 DIAGNOSIS — Z7901 Long term (current) use of anticoagulants: Secondary | ICD-10-CM | POA: Insufficient documentation

## 2016-01-02 DIAGNOSIS — Z8249 Family history of ischemic heart disease and other diseases of the circulatory system: Secondary | ICD-10-CM | POA: Insufficient documentation

## 2016-01-02 DIAGNOSIS — I5032 Chronic diastolic (congestive) heart failure: Secondary | ICD-10-CM

## 2016-01-02 MED ORDER — FUROSEMIDE 40 MG PO TABS: 40.0000 mg | ORAL_TABLET | ORAL | Status: DC

## 2016-01-02 MED ORDER — MIDODRINE HCL 2.5 MG PO TABS: 2.5000 mg | ORAL_TABLET | Freq: Three times a day (TID) | ORAL | Status: DC

## 2016-01-02 MED FILL — MIDODRINE HCL 2.5 MG TABLET: 2.5 | 30 days supply | Qty: 90 | Fill #0

## 2016-01-02 NOTE — Patient Instructions (Signed)
DECREASE Lasix to 40 mg (1 tab) every OTHER day. May take 1 extra tablet once daily AS NEEDED for weight gain/edema.  START Midodrine 2.5 mg tablet three times daily.  Follow up 1 month with Dr. Haroldine Laws.  Do the following things EVERYDAY: 1) Weigh yourself in the morning before breakfast. Write it down and keep it in a log. 2) Take your medicines as prescribed 3) Eat low salt foods-Limit salt (sodium) to 2000 mg per day.  4) Stay as active as you can everyday 5) Limit all fluids for the day to less than 2 liters

## 2016-01-02 NOTE — Addendum Note (Signed)
Encounter addended by: Effie Berkshire, RN on: 01/02/2016  3:00 PM<BR>     Documentation filed: Orders

## 2016-01-02 NOTE — Progress Notes (Signed)
Patient ID: [name], female   DOB: 19-Aug-1945, 70 y.o.   MRN: [id number]    Advanced Heart Failure Clinic Note   PCP: Primary HF Cardiologist: Dr Haroldine Laws EP: Dr Lovena Le.  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 Jude ICD 04/2015, & chronic diastolic CHF (EF 0000000 by echo, 45-50% by cath).   Admitted 09/17/15 with new rapid aflutter (previously PAF/VT) with plans for cardioversion. She was also noted to be volume overload in setting of non-compliance (thought to be dietary). Had TEE/DC-CV with return to NSR. She developed laryngospasm and required brief intubation but recovered in PACU post procedure. Enrolled in the ReDS vest trial,  Reading 53%. She continued to diurese with IV lasix and transitioned to lasix 40 mg twice a day. On the day of discharge Reds Vest reading was 39. Discharge weight was 160 pounds.   Today she returns for regular follow up. At last visit bisoprolol stopped to fatigue. Gets around the house ok but has to hold onto things. No CP or SOB. At last visit we ordered losartan and also referred to [facility]. She has not started any of this. No significant edema. Has been feeling lightheaded. Son says she passed out twice out on the way over her. She does not remember this. ICD interrogated. No events. Fluid ok. Appetite improved. Has gained 8 pounds. Takes lasix 40 daily and once or twice a week takes an extra one.   09/2015 TEE EF 35-40% No evidence of thrombus  09/18/2015 ECHO EF 45-50%   Labs 09/24/2015: K 4.1 Creatinine 1.33    ROS: All systems negative except as listed in HPI, PMH and Problem List.  SH:  Social History   Social History  . Marital Status: Single    Spouse Name: N/A  . Number of Children: N/A  . Years of Education: N/A   Occupational  History  . Not on file.   Social History Main Topics  . Smoking status: Former Smoker -- 4 years    Quit date: 12/31/1980  . Smokeless tobacco: Never Used  . Alcohol Use: No  . Drug Use: No  . Sexual Activity: Not Currently   Other Topics Concern  . Not on file   Social History Narrative   Divorced.  Lives with daughter and grandson. 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    FH:  Family History  Problem Relation Age of Onset  . Breast cancer Mother   . Diabetes Father   . Stroke Father   . Heart disease Sister   . Healthy Sister     Past Medical History  Diagnosis Date  . Diabetes mellitus without complication ([place])     dx on wed  . Paroxysmal atrial fibrillation ([name])   . Hypertrophic cardiomyopathy ([place])   . Left atrial enlargement   . Tricuspid regurgitation     a. Echo 04/2015: Mod-severe TR. b. Not mentioned on echo 05/2015  . Ventricular tachycardia ([place])     a. s/p St. Jude ICD implanted 04/2015.  [name] CAD (coronary artery disease), native coronary artery - 3 vessel 04/21/2015    a. NSTEMI 8-04/2015 felt 2/2 demand ischemia. b. 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  . Abnormal PFT   . Chronic  diastolic CHF (congestive heart failure) ([place])   . Mitral regurgitation     a. Echo 04/2015: moderate mitral regurgitation. b. F/u echo 05/2015: mild MR.  . Diabetes mellitus ([place])   . Shortness of breath dyspnea     Current Outpatient Prescriptions  Medication Sig Dispense Refill  . acetaminophen (TYLENOL) 325 MG tablet Take 2 tablets (650 mg total) by mouth every 4 (four) hours as needed for headache or mild pain.    [name] amiodarone (PACERONE) 200 MG tablet Take 1 tablet (200 mg total) by mouth daily. 30 tablet 11  . atorvastatin (LIPITOR) 40 MG tablet Take 1 tablet (40 mg total) by mouth daily at 6 PM. 30 tablet 3  . Blood Glucose Monitoring Suppl (TRUE METRIX METER) DEVI 1 each by Does not apply route 2 (two) times  daily. 1 Device 0  . furosemide (LASIX) 40 MG tablet Take 40 mg by mouth every morning. Take 1 tab in PM as needed    . glipiZIDE (GLUCOTROL) 5 MG tablet Take 5 mg by mouth daily.    [name] glucose blood (TRUE METRIX BLOOD GLUCOSE TEST) test strip Use as instructed twice daily 100 each 12  . metFORMIN (GLUCOPHAGE) 500 MG tablet Take 1 tablet (500 mg total) by mouth daily with breakfast. 60 tablet 3  . rivaroxaban (XARELTO) 20 MG TABS tablet Take 1 tablet (20 mg total) by mouth daily. 90 tablet 3  . TRUEPLUS LANCETS 28G MISC Use as instructed twice daily. 60 each 5   No current facility-administered medications for this encounter.    Filed Vitals:   01/02/16 1411  BP: 108/56  Pulse: 65  Weight: 168 lb 12 oz (76.544 kg)  SpO2: 97%   Wt Readings from Last 3 Encounters:  01/02/16 168 lb 12 oz (76.544 kg)  10/30/15 159 lb (72.122 kg)  10/04/15 160 lb (72.576 kg)     PHYSICAL EXAM: Sitting 108/56 Standing 96/54  General:  Well appearing. No resp difficulty. Ambulated in the clinic with a cane.  HEENT: normal Neck: supple. JVP flat. Carotids 2+ bilaterally; no bruits. No thyromegaly or nodule noted Cor: PMI normal. RRR, No M/G/R Lungs: clear, normal effort Abdomen: soft, NT, ND, no HSM. No bruits or masses. +BS  Extremities: no cyanosis, clubbing, rash, edema.  Neuro: alert & orientedx3, cranial nerves grossly intact. Moves all 4 extremities w/o difficulty. Affect pleasant.  ASSESSMENT & PLAN: 1. Chronic Combined HF- EF TEE 09/2015 45-50% Primarily Ischemic CM NYHA II-III.  - Volume status stable on exam and by Corevue. May be mildly volume depleted as she is orthostatic. I suggested cutting lasix back to every other day. She was a little reluctant to do this as she does easily develop fluid retention  - Off bisoprolol due to fatigue - Will not add losartan with low BP - Add midodrine 2.5 tid 2. PAF- S/P DC-CV 09/20/15. Today she is maintaining NSR. On Xarelto. Follows with Dr.  Lovena Le 3. CAD- 3V CAD by cath 9/20160 on Statin , BB, Xarelto. Needs to follow up with Dr C. No evidence of ischemia.  4. H/O VT with ICD placed 04/2015.  5. Fatigue 6. DM2  7. Orthostatic hypotension - cut lasix as tolerated - Add midodrine 2.5 tid  Follow up in 1 month.    Shiori Adcox,MD 2:44 PM

## 2016-01-02 NOTE — Addendum Note (Signed)
Encounter addended by: Effie Berkshire, RN on: 01/02/2016  3:07 PM<BR>     Documentation filed: Patient Instructions Section, Orders

## 2016-01-03 ENCOUNTER — Ambulatory Visit (INDEPENDENT_AMBULATORY_CARE_PROVIDER_SITE_OTHER): Payer: PPO | Admitting: *Deleted

## 2016-01-03 DIAGNOSIS — I422 Other hypertrophic cardiomyopathy: Secondary | ICD-10-CM | POA: Diagnosis not present

## 2016-01-03 NOTE — Progress Notes (Signed)
Remote ICD transmission.   

## 2016-01-18 LAB — CUP PACEART REMOTE DEVICE CHECK
Battery Remaining Longevity: 76 mo
Battery Remaining Percentage: 90 %
Brady Statistic RV Percent Paced: 1 % — CL
HighPow Impedance: 64 Ohm
Implantable Lead Implant Date: 20160912
Implantable Lead Location: 753859
Implantable Lead Location: 753860
Implantable Lead Model: 7122
MDC IDC LEAD IMPLANT DT: 20160912
MDC IDC MSMT LEADCHNL RA IMPEDANCE VALUE: 450 Ohm
MDC IDC MSMT LEADCHNL RA SENSING INTR AMPL: 3.9 mV
MDC IDC MSMT LEADCHNL RV IMPEDANCE VALUE: 460 Ohm
MDC IDC MSMT LEADCHNL RV SENSING INTR AMPL: 11.7 mV
MDC IDC PG SERIAL: 7279969
MDC IDC SESS DTM: 20170616090954
MDC IDC STAT BRADY RA PERCENT PACED: 51 %

## 2016-01-23 ENCOUNTER — Encounter: Payer: Self-pay | Admitting: Cardiology

## 2016-01-23 MED FILL — ATORVASTATIN 40 MG TABLET: 40 | 30 days supply | Qty: 30 | Fill #8

## 2016-01-23 MED FILL — AMIODARONE HCL 200 MG TAB: 200 | 30 days supply | Qty: 60 | Fill #2

## 2016-01-23 MED FILL — ?METFORMIN HCL 500MG TABLET: 500 | 30 days supply | Qty: 30 | Fill #7

## 2016-01-25 MED FILL — XARELTO 20 MG TABLET: 20 | 30 days supply | Qty: 30 | Fill #7

## 2016-01-29 ENCOUNTER — Encounter: Payer: Self-pay | Admitting: Internal Medicine

## 2016-02-06 ENCOUNTER — Encounter (HOSPITAL_COMMUNITY): Payer: PPO | Admitting: Internal Medicine

## 2016-02-19 ENCOUNTER — Other Ambulatory Visit: Payer: Self-pay | Admitting: Family Medicine

## 2016-02-19 MED FILL — glipiZIDE 5 MG TABS: 5 | 30 days supply | Qty: 60 | Fill #3

## 2016-02-19 MED FILL — MIDODRINE HCL 2.5 MG TABLET: 2.5 | 30 days supply | Qty: 90 | Fill #1

## 2016-02-19 MED FILL — ?SPIRONOLACTONE 25 MG TABLE: 25 | 30 days supply | Qty: 15 | Fill #1

## 2016-02-19 MED FILL — XARELTO 20 MG TABLET: 20 | 30 days supply | Qty: 30 | Fill #8

## 2016-02-19 MED FILL — FUROSEMIDE 40 MG TABLET: 40 | 30 days supply | Qty: 60 | Fill #1

## 2016-03-19 ENCOUNTER — Other Ambulatory Visit: Payer: Self-pay | Admitting: Family Medicine

## 2016-03-19 MED FILL — XARELTO 20 MG TABLET: 20 | 30 days supply | Qty: 30 | Fill #9

## 2016-03-21 ENCOUNTER — Ambulatory Visit (HOSPITAL_COMMUNITY)
Admission: RE | Admit: 2016-03-21 | Discharge: 2016-03-21 | Disposition: A | Discharge status: Home / Self Care | Payer: PPO | Source: Ambulatory Visit | Attending: Internal Medicine | Admitting: Internal Medicine

## 2016-03-21 ENCOUNTER — Encounter: Payer: Self-pay | Admitting: Nurse Practitioner

## 2016-03-21 ENCOUNTER — Encounter (HOSPITAL_COMMUNITY): Payer: Self-pay | Admitting: Internal Medicine

## 2016-03-21 VITALS — BP 130/76 | HR 64 | Wt 172.4 lb

## 2016-03-21 DIAGNOSIS — I2510 Atherosclerotic heart disease of native coronary artery without angina pectoris: Secondary | ICD-10-CM | POA: Diagnosis not present

## 2016-03-21 DIAGNOSIS — R0989 Other specified symptoms and signs involving the circulatory and respiratory systems: Secondary | ICD-10-CM | POA: Insufficient documentation

## 2016-03-21 DIAGNOSIS — E119 Type 2 diabetes mellitus without complications: Secondary | ICD-10-CM | POA: Diagnosis not present

## 2016-03-21 DIAGNOSIS — Z7901 Long term (current) use of anticoagulants: Secondary | ICD-10-CM | POA: Insufficient documentation

## 2016-03-21 DIAGNOSIS — I252 Old myocardial infarction: Secondary | ICD-10-CM | POA: Diagnosis not present

## 2016-03-21 DIAGNOSIS — I5032 Chronic diastolic (congestive) heart failure: Secondary | ICD-10-CM | POA: Diagnosis not present

## 2016-03-21 DIAGNOSIS — R5383 Other fatigue: Secondary | ICD-10-CM | POA: Diagnosis not present

## 2016-03-21 DIAGNOSIS — Z87891 Personal history of nicotine dependence: Secondary | ICD-10-CM | POA: Insufficient documentation

## 2016-03-21 DIAGNOSIS — I255 Ischemic cardiomyopathy: Secondary | ICD-10-CM | POA: Diagnosis not present

## 2016-03-21 DIAGNOSIS — I480 Paroxysmal atrial fibrillation: Secondary | ICD-10-CM | POA: Diagnosis not present

## 2016-03-21 DIAGNOSIS — Z8249 Family history of ischemic heart disease and other diseases of the circulatory system: Secondary | ICD-10-CM | POA: Diagnosis not present

## 2016-03-21 DIAGNOSIS — I5042 Chronic combined systolic (congestive) and diastolic (congestive) heart failure: Secondary | ICD-10-CM | POA: Diagnosis not present

## 2016-03-21 DIAGNOSIS — Z833 Family history of diabetes mellitus: Secondary | ICD-10-CM | POA: Diagnosis not present

## 2016-03-21 DIAGNOSIS — I951 Orthostatic hypotension: Secondary | ICD-10-CM | POA: Diagnosis not present

## 2016-03-21 DIAGNOSIS — Z823 Family history of stroke: Secondary | ICD-10-CM | POA: Diagnosis not present

## 2016-03-21 DIAGNOSIS — Z79899 Other long term (current) drug therapy: Secondary | ICD-10-CM | POA: Insufficient documentation

## 2016-03-21 DIAGNOSIS — Z7984 Long term (current) use of oral hypoglycemic drugs: Secondary | ICD-10-CM | POA: Diagnosis not present

## 2016-03-21 DIAGNOSIS — Z95810 Presence of automatic (implantable) cardiac defibrillator: Secondary | ICD-10-CM | POA: Diagnosis not present

## 2016-03-21 NOTE — Addendum Note (Signed)
Encounter addended by: Scarlette Calico, RN on: 03/21/2016  3:07 PM<BR>    Actions taken: Visit diagnoses modified, Order Entry activity accessed, Diagnosis association updated, Sign clinical note

## 2016-03-21 NOTE — Progress Notes (Signed)
Patient ID: [name], female   DOB: 07-Dec-1945, 70 y.o.   MRN: [id number]    Advanced Heart Failure Clinic Note   PCP: Primary HF Cardiologist: Dr Haroldine Laws EP: Dr Lovena Le.  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 Jude ICD 04/2015, & chronic diastolic CHF (EF 0000000 by echo, 45-50% by cath).   Admitted 09/17/15 with new rapid aflutter (previously PAF/VT) with plans for cardioversion. She was also noted to be volume overload in setting of non-compliance (thought to be dietary). Had TEE/DC-CV with return to NSR. She developed laryngospasm and required brief intubation but recovered in PACU post procedure. Enrolled in the ReDS vest trial,  Reading 53%. She continued to diurese with IV lasix and transitioned to lasix 40 mg twice a day. On the day of discharge Reds Vest reading was 39. Discharge weight was 160 pounds.   She returns today for regular follow up. At last visit started on midodrine 2.5 mg for orthostatic hypotension, but only taking BID.   Taking lasix every other day, but takes an extra 1-2 time a week. Breathing is great. Not having any SOB. Mostly active around the house. Cooks 3 times a day. She gets seldom SOB walking around the house. Mostly limited by knee pain. She did not go to [facility], doesn't think she could tolerate due to her right knee arthritis. Weight up 4 lbs. Last dose of lasix Wednesday (held this am for appts). Appetite is great.   ICD/Corevue interrogation: Thoracic impedence above threshold. No VT/VF.   09/2015 TEE EF 35-40% No evidence of thrombus  09/18/2015 ECHO EF 45-50%   Labs 09/24/2015: K 4.1 Creatinine 1.33    ROS: All systems negative except as listed in HPI, PMH and Problem List.  SH:  Social History   Social History  . Marital status:  Single    Spouse name: N/A  . Number of children: N/A  . Years of education: N/A   Occupational History  . Not on file.   Social History Main Topics  . Smoking status: Former Smoker    Years: 4.00    Quit date: 12/31/1980  . Smokeless tobacco: Never Used  . Alcohol use No  . Drug use: No  . Sexual activity: Not Currently   Other Topics Concern  . Not on file   Social History Narrative   Divorced.  Lives with daughter and grandson. 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    FH:  Family History  Problem Relation Age of Onset  . Breast cancer Mother   . Diabetes Father   . Stroke Father   . Heart disease Sister   . Healthy Sister     Past Medical History:  Diagnosis Date  . Abnormal PFT   . CAD (coronary artery disease), native coronary artery - 3 vessel 04/21/2015   a. NSTEMI 8-04/2015 felt 2/2 demand ischemia. b. 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  . Chronic diastolic CHF (congestive heart failure) ([place])   . Diabetes mellitus ([place])   . Diabetes mellitus without complication ([place])    dx on wed  . Hypertrophic cardiomyopathy ([place])   . Left atrial enlargement   . Mitral regurgitation    a. Echo 04/2015: moderate mitral regurgitation. b. F/u  echo 05/2015: mild MR.  . Paroxysmal atrial fibrillation ([place])   . Shortness of breath dyspnea   . Tricuspid regurgitation    a. Echo 04/2015: Mod-severe TR. b. Not mentioned on echo 05/2015  . Ventricular tachycardia ([place])    a. s/p St. Jude ICD implanted 04/2015.    Current Outpatient Prescriptions  Medication Sig Dispense Refill  . acetaminophen (TYLENOL) 325 MG tablet Take 2 tablets (650 mg total) by mouth every 4 (four) hours as needed for headache or mild pain.    [name] amiodarone (PACERONE) 200 MG tablet Take 1 tablet (200 mg total) by mouth daily. 30 tablet 11  . Blood Glucose Monitoring Suppl (TRUE METRIX METER) DEVI 1 each by Does not apply route 2 (two) times  daily. 1 Device 0  . furosemide (LASIX) 40 MG tablet Take 1 tablet (40 mg total) by mouth every other day. May take extra tablet once daily as needed for weight gain/edema. 45 tablet 6  . glipiZIDE (GLUCOTROL) 5 MG tablet Take 5 mg by mouth daily.    [name] glucose blood (TRUE METRIX BLOOD GLUCOSE TEST) test strip Use as instructed twice daily 100 each 12  . midodrine (PROAMATINE) 2.5 MG tablet Take 1 tablet (2.5 mg total) by mouth 3 (three) times daily with meals. 90 tablet 6  . rivaroxaban (XARELTO) 20 MG TABS tablet Take 1 tablet (20 mg total) by mouth daily. 90 tablet 3  . TRUEPLUS LANCETS 28G MISC Use as instructed twice daily. 60 each 5   No current facility-administered medications for this encounter.     Vitals:   03/21/16 1419  BP: 130/76  Pulse: 64  SpO2: 97%  Weight: 172 lb 6.4 oz (78.2 kg)   Wt Readings from Last 3 Encounters:  03/21/16 172 lb 6.4 oz (78.2 kg)  01/02/16 168 lb 12 oz (76.5 kg)  10/30/15 159 lb (72.1 kg)     PHYSICAL EXAM:   General:  Well appearing. No resp difficulty. Ambulated in the clinic with a cane.  HEENT: normal Neck: supple. JVP flat. Carotids 2+ bilaterally; R Carotid bruit. No thyromegaly or nodule noted Cor: PMI normal. RRR, No M/G/R Lungs: clear, normal effort Abdomen: soft, NT, ND, no HSM. No bruits or masses. +BS  Extremities: no cyanosis, clubbing, rash, edema.  Neuro: alert & orientedx3, cranial nerves grossly intact. Moves all 4 extremities w/o difficulty. Affect pleasant.  ASSESSMENT & PLAN: 1. Chronic Combined HF- EF TEE 09/2015 45-50% Primarily Ischemic CM NYHA II-III.  - Volume status stable on exam and by Corevue.  - Off bisoprolol due to fatigue - Will not add losartan with low BP. Once midodrine off can reconsider 2. PAF- S/P DC-CV 09/20/15. Today she is maintaining NSR. On Xarelto. Follows with Dr. Lovena Le 3. CAD- 3V CAD by cath 9/20160 on Statin , BB, Xarelto. Needs to follow up with Dr C - we encouraged her to do this as she  will need surgical consultation with [facility]. No evidence of ischemia. We offered to refer her directly to [facility] but she was adamant "I do not want surgery."  4. H/O VT with ICD placed 04/2015.  5. Fatigue 6. DM2  7. Orthostatic hypotension - BP improved with drop in lasix dosing - wean midodrine to prn. 8. Carotid bruit - Needs [place]  Will see back in HF Clinic in 6 months to touch base.   Glori Bickers, MD 2:26 PM

## 2016-03-21 NOTE — Patient Instructions (Addendum)
Your physician has requested that you have a carotid duplex. This test is an ultrasound of the carotid arteries in your neck. It looks at blood flow through these arteries that supply the brain with blood. Allow one hour for this exam. There are no restrictions or special instructions.  Your physician recommends that you schedule a follow-up appointment in: 4-6 weeks with Dr Sallyanne Kuster

## 2016-03-26 ENCOUNTER — Encounter: Payer: Self-pay | Admitting: Internal Medicine

## 2016-03-27 ENCOUNTER — Telehealth: Payer: Self-pay | Admitting: Cardiovascular Disease

## 2016-03-31 NOTE — Telephone Encounter (Signed)
Closed Encounter  °

## 2016-04-21 MED FILL — XARELTO 20 MG TABLET: 20 | 30 days supply | Qty: 30 | Fill #10

## 2016-04-23 ENCOUNTER — Other Ambulatory Visit: Payer: Self-pay | Admitting: Family Medicine

## 2016-04-24 ENCOUNTER — Telehealth: Payer: Self-pay | Admitting: Family Medicine

## 2016-04-24 ENCOUNTER — Other Ambulatory Visit: Payer: Self-pay | Admitting: Family Medicine

## 2016-04-24 NOTE — Telephone Encounter (Signed)
Pt. Called requesting a refill on glipiZIDE (GLUCOTROL) 5 MG tablet  Please f/u with pt.

## 2016-04-24 NOTE — Telephone Encounter (Signed)
Will forward request to Dr. Jarold Song, but patient has not been seen here recently and will need a visit.

## 2016-04-25 ENCOUNTER — Other Ambulatory Visit: Payer: Self-pay | Admitting: Family Medicine

## 2016-04-25 MED ORDER — GLIPIZIDE 5 MG PO TABS: 5.0000 mg | ORAL_TABLET | Freq: Every day | ORAL | 0 refills | Status: DC

## 2016-04-25 NOTE — Telephone Encounter (Signed)
Done.She needs an office visit

## 2016-04-28 NOTE — Telephone Encounter (Signed)
Call placed to patient advised request has been sent to pharmacy.

## 2016-05-06 MED FILL — glipiZIDE 5 MG TABS: 5 | 30 days supply | Qty: 30 | Fill #0

## 2016-05-08 ENCOUNTER — Encounter: Payer: Self-pay | Admitting: *Deleted

## 2016-05-08 ENCOUNTER — Ambulatory Visit: Payer: PPO | Admitting: Cardiovascular Disease

## 2016-05-19 ENCOUNTER — Ambulatory Visit (INDEPENDENT_AMBULATORY_CARE_PROVIDER_SITE_OTHER): Payer: PPO

## 2016-05-19 ENCOUNTER — Encounter (HOSPITAL_COMMUNITY): Payer: Self-pay | Admitting: Emergency Medicine

## 2016-05-19 ENCOUNTER — Ambulatory Visit (HOSPITAL_COMMUNITY)
Admission: EM | Admit: 2016-05-19 | Discharge: 2016-05-19 | Disposition: A | Discharge status: Home / Self Care | Payer: PPO | Attending: Emergency Medicine | Admitting: Emergency Medicine

## 2016-05-19 DIAGNOSIS — R05 Cough: Secondary | ICD-10-CM | POA: Diagnosis not present

## 2016-05-19 DIAGNOSIS — J069 Acute upper respiratory infection, unspecified: Secondary | ICD-10-CM

## 2016-05-19 DIAGNOSIS — J9801 Acute bronchospasm: Secondary | ICD-10-CM

## 2016-05-19 MED ORDER — SODIUM CHLORIDE 0.9 % IN NEBU
INHALATION_SOLUTION | RESPIRATORY_TRACT | Status: AC
  Filled 2016-05-19: qty 3

## 2016-05-19 MED ORDER — ALBUTEROL SULFATE HFA 108 (90 BASE) MCG/ACT IN AERS: 2.0000 | INHALATION_SPRAY | RESPIRATORY_TRACT | 0 refills | Status: DC | PRN

## 2016-05-19 MED ORDER — IPRATROPIUM-ALBUTEROL 0.5-2.5 (3) MG/3ML IN SOLN
RESPIRATORY_TRACT | Status: AC
  Filled 2016-05-19: qty 3

## 2016-05-19 MED ORDER — IPRATROPIUM-ALBUTEROL 0.5-2.5 (3) MG/3ML IN SOLN
3.0000 mL | Freq: Once | RESPIRATORY_TRACT | Status: AC
  Administered 2016-05-19: 3 mL via RESPIRATORY_TRACT

## 2016-05-19 MED ORDER — DOXYCYCLINE HYCLATE 100 MG PO CAPS: 100.0000 mg | ORAL_CAPSULE | Freq: Two times a day (BID) | ORAL | 0 refills | Status: DC

## 2016-05-19 NOTE — ED Provider Notes (Signed)
CSN: [id number]     Arrival date & time 05/19/16  1420 History   First MD Initiated Contact with Patient 05/19/16 1653     Chief Complaint  Patient presents with  . URI   (Consider location/radiation/quality/duration/timing/severity/associated sxs/prior Treatment) 70 year old female states that approximately one week ago she developed upper respiratory symptoms including a sore throat which has since abated associated with chills and subjective fevers. More recently she has developed a cough with chest congestion, green and yellow sputum, head congestion. In the past couple days she has had some diarrhea with 3 episodes today. Denies chest pain. She has a history of type 2 diabetes mellitus, hypertrophic cardiomyopathy, multivessel CAD, chronic diastolic CHF, dysrhythmias.      Past Medical History:  Diagnosis Date  . Abnormal PFT   . CAD (coronary artery disease), native coronary artery - 3 vessel 04/21/2015   a. NSTEMI 8-04/2015 felt 2/2 demand ischemia. b. 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  . Chronic diastolic CHF (congestive heart failure) ([place])   . Diabetes mellitus ([place])   . Diabetes mellitus without complication ([place])    dx on wed  . Hypertrophic cardiomyopathy ([place])   . Left atrial enlargement   . Mitral regurgitation    a. Echo 04/2015: moderate mitral regurgitation. b. F/u echo 05/2015: mild MR.  . Paroxysmal atrial fibrillation ([place])   . Shortness of breath dyspnea   . Tricuspid regurgitation    a. Echo 04/2015: Mod-severe TR. b. Not mentioned on echo 05/2015  . Ventricular tachycardia ([place])    a. s/p St. Jude ICD implanted 04/2015.   Past Surgical History:  Procedure Laterality Date  . APPLICATION OF WOUND VAC Left 01/06/2013   Procedure: APPLICATION OF WOUND VAC X 2;  Surgeon: [name], MD;  Location: [facility];  Service: Orthopedics;  Laterality: Left;  left forearm  . APPLICATION OF WOUND VAC Left  01/09/2013   Procedure: APPLICATION OF WOUND VAC;  Surgeon: [name], MD;  Location: [facility];  Service: Orthopedics;  Laterality: Left;  . CARDIAC CATHETERIZATION N/A 04/19/2015   Procedure: Left Heart Cath and Coronary Angiography;  Surgeon: [name], MD;  Location: [facility];  Service: Cardiovascular;  Laterality: N/A;  . CARDIOVERSION N/A 09/20/2015   Procedure: CARDIOVERSION;  Surgeon: Pixie Casino, MD;  Location: [facility] ENDOSCOPY;  Service: Cardiovascular;  Laterality: N/A;  . DENTAL SURGERY  Oct. 2011   several extractions, bone graft  . EP IMPLANTABLE DEVICE N/A 04/16/2015   Procedure: ICD Implant;  Surgeon: [name], MD;  Location: [facility];  Service: Cardiovascular;  Laterality: N/A;  . I&D EXTREMITY Left 01/06/2013   Procedure: IRRIGATION AND DEBRIDEMENT LEFT ELBOW AND LEFT FOREARM ;  Surgeon: [name], MD;  [facility]: [facility];  Service: Orthopedics;  Laterality: Left;  . INCISION AND DRAINAGE Left 01/09/2013   Procedure: REDO INCISION AND DRAINAGE LEFT ELBOW;  Surgeon: [name], MD;  [facility]: [facility];  Service: Orthopedics;  Laterality: Left;  SUPINE, UPPER EXTERMITY DRAPE  . SECONDARY CLOSURE OF WOUND Left 01/09/2013   Procedure: SECONDARY CLOSURE OF WOUND  LEFT ELBOW;  Surgeon: [name], MD;  [facility];  Service: Orthopedics;  Laterality: Left;  . TEE WITHOUT CARDIOVERSION N/A 09/20/2015   Procedure: TRANSESOPHAGEAL ECHOCARDIOGRAM (TEE);  Surgeon: Pixie Casino, MD;  Location: [facility] ENDOSCOPY;  Service: Cardiovascular;  Laterality: N/A;   Family History  Problem Relation  Age of Onset  . Breast cancer Mother   . Diabetes Father   . Stroke Father   . Heart disease Sister   . Healthy Sister    Social History  Substance Use Topics  . Smoking status: Former Smoker    Years: 4.00    Quit date: 12/31/1980  . Smokeless tobacco: Never Used  . Alcohol use No   OB History    Gravida Para Term Preterm AB Living             1    SAB TAB Ectopic Multiple Live Births                 Review of Systems  Constitutional: Positive for activity change, chills and fever. Negative for appetite change and fatigue.  HENT: Positive for congestion and postnasal drip. Negative for facial swelling, sore throat and trouble swallowing.   Eyes: Negative.   Respiratory: Positive for cough and shortness of breath.   Cardiovascular: Negative.  Negative for chest pain and leg swelling.  Gastrointestinal: Positive for diarrhea.       Patient states she has had vomiting associated with coughing.  Genitourinary: Negative.   Musculoskeletal: Negative for neck pain and neck stiffness.  Skin: Negative for pallor and rash.  Neurological: Negative.     Allergies  Review of patient's allergies indicates no known allergies.  Home Medications   Prior to Admission medications   Medication Sig Start Date End Date Taking? Authorizing Provider  acetaminophen (TYLENOL) 325 MG tablet Take 2 tablets (650 mg total) by mouth every 4 (four) hours as needed for headache or mild pain. 09/21/15   Erlene Quan, PA-C  albuterol (PROVENTIL HFA;VENTOLIN HFA) 108 (90 Base) MCG/ACT inhaler Inhale 2 puffs into the lungs every 4 (four) hours as needed for wheezing or shortness of breath. 05/19/16   Janne Napoleon, NP  amiodarone (PACERONE) 200 MG tablet Take 1 tablet (200 mg total) by mouth daily. 10/02/15   Amy D Ninfa Meeker, NP  Blood Glucose Monitoring Suppl (TRUE METRIX METER) DEVI 1 each by Does not apply route 2 (two) times daily. 04/27/15   Arnoldo Morale, MD  doxycycline (VIBRAMYCIN) 100 MG capsule Take 1 capsule (100 mg total) by mouth 2 (two) times daily. 05/19/16   Janne Napoleon, NP  furosemide (LASIX) 40 MG tablet Take 1 tablet (40 mg total) by mouth every other day. May take extra tablet once daily as needed for weight gain/edema. 01/02/16   Shaune Pascal Bensimhon, MD  glipiZIDE (GLUCOTROL) 5 MG tablet Take 1 tablet (5 mg total) by mouth daily. 04/25/16   Arnoldo Morale, MD   glucose blood (TRUE METRIX BLOOD GLUCOSE TEST) test strip Use as instructed twice daily 04/27/15   Arnoldo Morale, MD  midodrine (PROAMATINE) 2.5 MG tablet Take 1 tablet (2.5 mg total) by mouth 3 (three) times daily with meals. 01/02/16   Jolaine Artist, MD  rivaroxaban (XARELTO) 20 MG TABS tablet Take 1 tablet (20 mg total) by mouth daily. 05/21/15   Arnoldo Morale, MD  TRUEPLUS LANCETS 28G MISC Use as instructed twice daily. 04/27/15   Arnoldo Morale, MD   Meds Ordered and Administered this Visit   Medications  ipratropium-albuterol (DUONEB) 0.5-2.5 (3) MG/3ML nebulizer solution 3 mL (3 mLs Nebulization Given 05/19/16 1802)    BP 133/62 (BP Location: Left Arm)   Pulse 66   Temp 97.6 F (36.4 C) (Oral)   Resp 14   SpO2 97%  No data found.   Physical Exam  Constitutional: She is oriented to person, place, and time. She appears well-developed. No distress.  HENT:  Head: Normocephalic and atraumatic.  Right Ear: External ear normal.  Left Ear: External ear normal.  Bilateral TMs are normal. Oropharynx with clear PND. No exudates or swelling.  Eyes: EOM are normal.  Neck: Normal range of motion. Neck supple.  Cardiovascular: Normal rate, regular rhythm and normal heart sounds.   Pulmonary/Chest: Effort normal. No respiratory distress. She has wheezes. She has rales.  Diffuse Wheezes, rhonchi and coarseness bilaterally. Mildly prolonged expiratory phase.  Musculoskeletal: Normal range of motion. She exhibits no edema.  Lymphadenopathy:    She has no cervical adenopathy.  Neurological: She is alert and oriented to person, place, and time.  Skin: Skin is warm and dry.  Psychiatric: She has a normal mood and affect.  Nursing note and vitals reviewed.   Urgent Care Course   Clinical Course    Procedures (including critical care time)  Labs Review Labs Reviewed - No data to display  Imaging Review Dg Chest 2 View  Result Date: 05/19/2016 CLINICAL DATA:  Patient with  congestion and productive cough. EXAM: CHEST  2 VIEW COMPARISON:  Chest radiograph 09/18/2015. FINDINGS: Multi lead AICD device overlies the left hemi thorax. Stable enlarged cardiac and mediastinal contours. No consolidative pulmonary opacities. Possible irregular nodular opacity within the left upper hemi thorax. No pleural effusion or pneumothorax. Mid thoracic spine degenerative changes. IMPRESSION: No acute cardiopulmonary process. Possible irregular nodular opacity within the left upper hemi thorax. In the nonacute setting, recommend correlation with chest CT to exclude the possibility of underlying pulmonary nodule versus overlying tissue. Electronically Signed   By: Lovey Newcomer M.D.   On: 05/19/2016 17:58     Visual Acuity Review  Right Eye Distance:   Left Eye Distance:   Bilateral Distance:    Right Eye Near:   Left Eye Near:    Bilateral Near:         MDM   1. Acute upper respiratory infection   2. Cough due to bronchospasm   3. Bronchospasm    You have significant bronchospasm of your airways which prevents sufficient air going in and out of the lungs. This can adversely affect your heart and other organs. He was improved with the albuterol/ipratropium inhaler. It is recommended that you be prescribed an albuterol inhaler to open your airways and prednisone for the inflammation in your airways. Also recommend taking a nonsedating antihistamine although understand that a couple of them may cause hives ,you may choose the one that does not cause hives. The x-ray does not show evidence of heart failure or pneumonia today. There is a nodule in your lungs that although does not look highly suspicious at this time it is recommended that she follow up with your doctor regarding this. If you get worse, have problems breathing increased wheezing, fever, chills or go to emergency department promptly. After describing to the patient the findings and diagnosis and offering treatments of  antihistamine for PND, I better all inhaler and prednisone for bronchospasm and bronchitis which is likely viral the patient stated that the only thing she wanted was a Z-Pak. She insisted on a Z-Pak and nothing else and did not want any other treatment. Explained to her that this was likely not be beneficial but the other medicines were. As the patient was getting upset and having to take medicines that she was not used to taking and the fact that she was requesting a  medication that likely would not be beneficial to her she wanted to leave early. The azithromycin taken with amiodarone causes increased QT interval and advised her of this and told her that she could not take this particular medication. The patient states "that I just came up her for nothing." She did receive a DuoNeb which improved her breathing subjectively and increased her air movement and decreased wheezing. Meds ordered this encounter  Medications  . ipratropium-albuterol (DUONEB) 0.5-2.5 (3) MG/3ML nebulizer solution 3 mL  . doxycycline (VIBRAMYCIN) 100 MG capsule    Sig: Take 1 capsule (100 mg total) by mouth 2 (two) times daily.    Dispense:  14 capsule    Refill:  0    Order Specific Question:   Supervising Provider    Answer:   Melynda Ripple [[id number]]  . albuterol (PROVENTIL HFA;VENTOLIN HFA) 108 (90 Base) MCG/ACT inhaler    Sig: Inhale 2 puffs into the lungs every 4 (four) hours as needed for wheezing or shortness of breath.    Dispense:  1 Inhaler    Refill:  0    Order Specific Question:   Supervising Provider    Answer:   Melynda Ripple [id number]      Janne Napoleon, NP 05/19/16 1849

## 2016-05-19 NOTE — ED Triage Notes (Signed)
Patient reports symptoms started last week.  Initially had a sore throat, this has resolved, but other symptoms have occurred and worsened.  Patient has a cough, nasal congestion, chest congestion.  Patient has had chills, diarrhea, and vomiting.  Patient reports 3 diarrhea episodes today and one vomiting episode today.

## 2016-05-19 NOTE — Discharge Instructions (Signed)
You have significant bronchospasm of your airways which prevents sufficient air going in and out of the lungs. This can adversely affect your heart and other organs. He was improved with the albuterol/ipratropium inhaler. It is recommended that you be prescribed an albuterol inhaler to open your airways and prednisone for the inflammation in your airways. Also recommend taking a nonsedating antihistamine although understand that a couple of them may cause hives ,you may choose the one that does not cause hives. The x-ray does not show evidence of heart failure or pneumonia today. There is a nodule in your lungs that although does not look highly suspicious at this time it is recommended that she follow up with your doctor regarding this. If you get worse, have problems breathing increased wheezing, fever, chills or go to emergency department promptly.

## 2016-05-22 ENCOUNTER — Other Ambulatory Visit: Payer: Self-pay | Admitting: Family Medicine

## 2016-05-22 MED FILL — XARELTO 20 MG TABLET: 20 | 30 days supply | Qty: 30 | Fill #0

## 2016-05-28 ENCOUNTER — Inpatient Hospital Stay: Payer: PPO | Admitting: Critical Care Medicine

## 2016-06-10 ENCOUNTER — Telehealth: Payer: Self-pay | Admitting: Family Medicine

## 2016-06-10 MED ORDER — GLIPIZIDE 5 MG PO TABS: 5.0000 mg | ORAL_TABLET | Freq: Every day | ORAL | 0 refills | Status: DC

## 2016-06-10 NOTE — Telephone Encounter (Signed)
Glipizide refilled to last until appt.

## 2016-06-10 NOTE — Telephone Encounter (Signed)
Patient needs refill for glipiZIDE (GLUCOTROL) 5 MG tablet. Patient has an appt on 11/13 and needs some pills until then. Please follow up.   Thank you

## 2016-06-16 ENCOUNTER — Ambulatory Visit: Payer: PPO | Attending: Family Medicine | Admitting: Family Medicine

## 2016-06-16 ENCOUNTER — Encounter: Payer: Self-pay | Admitting: Family Medicine

## 2016-06-16 VITALS — BP 115/65 | HR 65 | Temp 98.3°F | Ht 64.0 in | Wt 173.6 lb

## 2016-06-16 DIAGNOSIS — I480 Paroxysmal atrial fibrillation: Secondary | ICD-10-CM | POA: Insufficient documentation

## 2016-06-16 DIAGNOSIS — I4891 Unspecified atrial fibrillation: Secondary | ICD-10-CM | POA: Diagnosis not present

## 2016-06-16 DIAGNOSIS — I472 Ventricular tachycardia: Secondary | ICD-10-CM | POA: Insufficient documentation

## 2016-06-16 DIAGNOSIS — E119 Type 2 diabetes mellitus without complications: Secondary | ICD-10-CM

## 2016-06-16 DIAGNOSIS — I340 Nonrheumatic mitral (valve) insufficiency: Secondary | ICD-10-CM | POA: Insufficient documentation

## 2016-06-16 DIAGNOSIS — E039 Hypothyroidism, unspecified: Secondary | ICD-10-CM | POA: Diagnosis not present

## 2016-06-16 DIAGNOSIS — I2510 Atherosclerotic heart disease of native coronary artery without angina pectoris: Secondary | ICD-10-CM

## 2016-06-16 DIAGNOSIS — I130 Hypertensive heart and chronic kidney disease with heart failure and stage 1 through stage 4 chronic kidney disease, or unspecified chronic kidney disease: Secondary | ICD-10-CM | POA: Insufficient documentation

## 2016-06-16 DIAGNOSIS — E1122 Type 2 diabetes mellitus with diabetic chronic kidney disease: Secondary | ICD-10-CM | POA: Insufficient documentation

## 2016-06-16 DIAGNOSIS — Z7901 Long term (current) use of anticoagulants: Secondary | ICD-10-CM | POA: Insufficient documentation

## 2016-06-16 DIAGNOSIS — I252 Old myocardial infarction: Secondary | ICD-10-CM | POA: Insufficient documentation

## 2016-06-16 DIAGNOSIS — I422 Other hypertrophic cardiomyopathy: Secondary | ICD-10-CM | POA: Insufficient documentation

## 2016-06-16 DIAGNOSIS — Z95810 Presence of automatic (implantable) cardiac defibrillator: Secondary | ICD-10-CM

## 2016-06-16 DIAGNOSIS — E1149 Type 2 diabetes mellitus with other diabetic neurological complication: Secondary | ICD-10-CM | POA: Diagnosis not present

## 2016-06-16 DIAGNOSIS — E1140 Type 2 diabetes mellitus with diabetic neuropathy, unspecified: Secondary | ICD-10-CM | POA: Insufficient documentation

## 2016-06-16 DIAGNOSIS — I5032 Chronic diastolic (congestive) heart failure: Secondary | ICD-10-CM

## 2016-06-16 DIAGNOSIS — Z7984 Long term (current) use of oral hypoglycemic drugs: Secondary | ICD-10-CM | POA: Insufficient documentation

## 2016-06-16 DIAGNOSIS — N183 Chronic kidney disease, stage 3 unspecified: Secondary | ICD-10-CM | POA: Insufficient documentation

## 2016-06-16 DIAGNOSIS — N189 Chronic kidney disease, unspecified: Secondary | ICD-10-CM | POA: Insufficient documentation

## 2016-06-16 DIAGNOSIS — N182 Chronic kidney disease, stage 2 (mild): Secondary | ICD-10-CM

## 2016-06-16 LAB — COMPLETE METABOLIC PANEL WITH GFR
ALK PHOS: 89 U/L (ref 33–130)
ALT: 12 U/L (ref 6–29)
AST: 16 U/L (ref 10–35)
Albumin: 4 g/dL (ref 3.6–5.1)
BILIRUBIN TOTAL: 0.4 mg/dL (ref 0.2–1.2)
BUN: 45 mg/dL — ABNORMAL HIGH (ref 7–25)
CHLORIDE: 108 mmol/L (ref 98–110)
CO2: 24 mmol/L (ref 20–31)
CREATININE: 1.29 mg/dL — AB (ref 0.50–0.99)
Calcium: 9.3 mg/dL (ref 8.6–10.4)
GFR, EST NON AFRICAN AMERICAN: 43 mL/min — AB (ref >=60)
GFR, Est African American: 49 mL/min — ABNORMAL LOW (ref >=60)
Glucose, Bld: 151 mg/dL — ABNORMAL HIGH (ref 65–99)
POTASSIUM: 5.3 mmol/L (ref 3.5–5.3)
SODIUM: 139 mmol/L (ref 135–146)
Total Protein: 7 g/dL (ref 6.1–8.1)

## 2016-06-16 LAB — LIPID PANEL
CHOLESTEROL: 190 mg/dL (ref <200)
HDL: 45 mg/dL — ABNORMAL LOW (ref >50)
LDL CALC: 118 mg/dL — AB (ref <100)
TRIGLYCERIDES: 134 mg/dL (ref <150)
Total CHOL/HDL Ratio: 4.2 Ratio (ref <5.0)
VLDL: 27 mg/dL (ref <30)

## 2016-06-16 LAB — POCT GLYCOSYLATED HEMOGLOBIN (HGB A1C): HEMOGLOBIN A1C: 6.6

## 2016-06-16 LAB — GLUCOSE, POCT (MANUAL RESULT ENTRY): POC GLUCOSE: 150 mg/dL — AB (ref 70–99)

## 2016-06-16 LAB — TSH: TSH: 4.14 mIU/L

## 2016-06-16 MED ORDER — RIVAROXABAN 20 MG PO TABS: 20.0000 mg | ORAL_TABLET | Freq: Every day | ORAL | 1 refills | Status: DC

## 2016-06-16 MED ORDER — GABAPENTIN 300 MG PO CAPS: 300.0000 mg | ORAL_CAPSULE | Freq: Every day | ORAL | 1 refills | Status: DC

## 2016-06-16 MED ORDER — GLIPIZIDE 5 MG PO TABS: 5.0000 mg | ORAL_TABLET | Freq: Every day | ORAL | 1 refills | Status: AC

## 2016-06-16 MED FILL — XARELTO 20 MG TABLET: 20 | 30 days supply | Qty: 30 | Fill #0

## 2016-06-16 MED FILL — GABAPENTIN 300 MG CAPSULE: 300 | 30 days supply | Qty: 30 | Fill #0

## 2016-06-16 NOTE — Progress Notes (Signed)
Med refills

## 2016-06-16 NOTE — Patient Instructions (Signed)

## 2016-06-16 NOTE — Progress Notes (Signed)
Subjective:  Patient ID: [name], female    DOB: 1946/06/26  Age: 70 y.o. MRN: [id number]  CC: Diabetes and Nasal Congestion   HPI [name] year old female with a history of Type 2 Diabetes Mellitus (A1c 6.6), Hypertension, Hypothyroidism, diastolic CHF (EF Q000111Q from 2-D echo 09/2015), 3 vessel CAD (from cardiac cath 04/2015), ventricular tachycardia (status post St. Jude's ICD implant in 04/2015, last interrogated in 01/03/2016) who comes in for follow-up visit.  She was last seen in the clinic 11 months ago but has been compliant with her medications and her appointment with cardiology; she informs me that she is not willing to proceed with CABG despite her coronary artery disease. She was previously on midodrine for hypotension which she no longer takes and also does not take levothyroxine for hypothyroidism any longer.  Reports she has had some low blood sugars in the 70s, this have been exceptions and she has been compliant with glipizide and diet is vegetarian. Endorses some numbness in her hands but denies burning. Denies visual complaints and is yet to have an annual eye exam.  Past Medical History:  Diagnosis Date  . Abnormal PFT   . CAD (coronary artery disease), native coronary artery - 3 vessel 04/21/2015   a. NSTEMI 8-04/2015 felt 2/2 demand ischemia. b. 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  . Chronic diastolic CHF (congestive heart failure) ([place])   . Diabetes mellitus ([place])   . Diabetes mellitus without complication ([place])    dx on wed  . Hypertrophic cardiomyopathy ([place])   . Left atrial enlargement   . Mitral regurgitation    a. Echo 04/2015: moderate mitral regurgitation. b. F/u echo 05/2015: mild MR.  . Paroxysmal atrial fibrillation ([place])   . Shortness of breath dyspnea   . Tricuspid regurgitation    a. Echo 04/2015: Mod-severe TR. b. Not mentioned on echo 05/2015  . Ventricular tachycardia ([place])    a.  s/p St. Jude ICD implanted 04/2015.    Past Surgical History:  Procedure Laterality Date  . APPLICATION OF WOUND VAC Left 01/06/2013   Procedure: APPLICATION OF WOUND VAC X 2;  Surgeon: [name], MD;  [facility];  Service: Orthopedics;  Laterality: Left;  left forearm  . APPLICATION OF WOUND VAC Left 01/09/2013   Procedure: APPLICATION OF WOUND VAC;  Surgeon: [name], MD;  [facility];  Service: Orthopedics;  Laterality: Left;  . CARDIAC CATHETERIZATION N/A 04/19/2015   Procedure: Left Heart Cath and Coronary Angiography;  Surgeon: [name], MD;  Location: [facility];  Service: Cardiovascular;  Laterality: N/A;  . CARDIOVERSION N/A 09/20/2015   Procedure: CARDIOVERSION;  Surgeon: Pixie Casino, MD;  Location: [facility] ENDOSCOPY;  Service: Cardiovascular;  Laterality: N/A;  . DENTAL SURGERY  Oct. 2011   several extractions, bone graft  . EP IMPLANTABLE DEVICE N/A 04/16/2015   Procedure: ICD Implant;  Surgeon: [name], MD;  Location: [facility];  Service: Cardiovascular;  Laterality: N/A;  . I&D EXTREMITY Left 01/06/2013   Procedure: IRRIGATION AND DEBRIDEMENT LEFT ELBOW AND LEFT FOREARM ;  Surgeon: [name], MD;  [facility];  Service: Orthopedics;  Laterality: Left;  . INCISION AND DRAINAGE Left 01/09/2013   Procedure: REDO INCISION AND DRAINAGE LEFT ELBOW;  Surgeon: [name], MD;  [facility];  Service: Orthopedics;  Laterality: Left;  SUPINE, UPPER EXTERMITY DRAPE  .  SECONDARY CLOSURE OF WOUND Left 01/09/2013   Procedure: SECONDARY CLOSURE OF WOUND  LEFT ELBOW;  Surgeon: [name], MD;  [facility];  Service: Orthopedics;  Laterality: Left;  . TEE WITHOUT CARDIOVERSION N/A 09/20/2015   Procedure: TRANSESOPHAGEAL ECHOCARDIOGRAM (TEE);  Surgeon: Pixie Casino, MD;  Location: [facility] ENDOSCOPY;  Service: Cardiovascular;  Laterality: N/A;    No Known Allergies   Outpatient Medications Prior to Visit  Medication Sig  Dispense Refill  . acetaminophen (TYLENOL) 325 MG tablet Take 2 tablets (650 mg total) by mouth every 4 (four) hours as needed for headache or mild pain.    [name] amiodarone (PACERONE) 200 MG tablet Take 1 tablet (200 mg total) by mouth daily. 30 tablet 11  . Blood Glucose Monitoring Suppl (TRUE METRIX METER) DEVI 1 each by Does not apply route 2 (two) times daily. 1 Device 0  . furosemide (LASIX) 40 MG tablet Take 1 tablet (40 mg total) by mouth every other day. May take extra tablet once daily as needed for weight gain/edema. 45 tablet 6  . glucose blood (TRUE METRIX BLOOD GLUCOSE TEST) test strip Use as instructed twice daily 100 each 12  . TRUEPLUS LANCETS 28G MISC Use as instructed twice daily. 60 each 5  . glipiZIDE (GLUCOTROL) 5 MG tablet Take 1 tablet (5 mg total) by mouth daily. 30 tablet 0  . XARELTO 20 MG TABS tablet TAKE 1 TABLET BY MOUTH DAILY 30 tablet 0  . albuterol (PROVENTIL HFA;VENTOLIN HFA) 108 (90 Base) MCG/ACT inhaler Inhale 2 puffs into the lungs every 4 (four) hours as needed for wheezing or shortness of breath. (Patient not taking: Reported on 06/16/2016) 1 Inhaler 0  . doxycycline (VIBRAMYCIN) 100 MG capsule Take 1 capsule (100 mg total) by mouth 2 (two) times daily. 14 capsule 0  . midodrine (PROAMATINE) 2.5 MG tablet Take 1 tablet (2.5 mg total) by mouth 3 (three) times daily with meals. (Patient not taking: Reported on 06/16/2016) 90 tablet 6   No facility-administered medications prior to visit.     ROS Review of Systems  Constitutional: Negative for activity change, appetite change and fatigue.  HENT: Negative for congestion, sinus pressure and sore throat.   Eyes: Negative for visual disturbance.  Respiratory: Negative for cough, chest tightness, shortness of breath and wheezing.   Cardiovascular: Negative for chest pain and palpitations.  Gastrointestinal: Negative for abdominal distention, abdominal pain and constipation.  Endocrine: Negative for polydipsia.    Genitourinary: Negative for dysuria and frequency.  Musculoskeletal: Negative for arthralgias and back pain.  Skin: Negative for rash.  Neurological: Positive for numbness. Negative for tremors and light-headedness.  Hematological: Does not bruise/bleed easily.  Psychiatric/Behavioral: Negative for agitation and behavioral problems.    Objective:  BP 115/65 (BP Location: Right Arm, Patient Position: Sitting, Cuff Size: Large)   Pulse 65   Temp 98.3 F (36.8 C) (Oral)   Ht 5\' 4"  (1.626 m)   Wt 173 lb 9.6 oz (78.7 kg)   SpO2 96%   BMI 29.80 kg/m   BP/Weight 06/16/2016 05/19/2016 Q000111Q  Systolic BP [id number]  Diastolic BP 65 62 76  Wt. (Lbs) 173.6 - 172.4  BMI 29.8 - 29.59  Some encounter information is confidential and restricted. Go to Review Flowsheets activity to see all data.      Physical Exam  Constitutional: She is oriented to person, place, and time. She appears well-developed and well-nourished.  HENT:  Mouth/Throat: Oropharynx is clear and moist.  Cerumen obscuring  both TMs  Cardiovascular: Normal rate, normal heart sounds and intact distal pulses.   No murmur heard. Pulmonary/Chest: Effort normal and breath sounds normal. She has no wheezes. She has no rales. She exhibits no tenderness.  Abdominal: Soft. Bowel sounds are normal. She exhibits no distension and no mass. There is no tenderness.  Musculoskeletal: Normal range of motion.  Neurological: She is alert and oriented to person, place, and time.  Skin: Skin is warm and dry.  Psychiatric: She has a normal mood and affect.     Lab Results  Component Value Date   HGBA1C 6.6 06/16/2016    CMP Latest Ref Rng & Units 10/30/2015 10/02/2015 09/24/2015  Glucose 65 - 99 mg/dL 133(H) 136(H) 119(H)  BUN 6 - 20 mg/dL 55(H) 53(H) 39(H)  Creatinine 0.44 - 1.00 mg/dL 1.99(H) 1.45(H) 1.33(H)  Sodium 135 - 145 mmol/L 139 142 141  Potassium 3.5 - 5.1 mmol/L 5.6(H) 4.8 4.1  Chloride 101 - 111 mmol/L 105 102  100(L)  CO2 22 - 32 mmol/L 22 28 29   Calcium 8.9 - 10.3 mg/dL 9.9 9.3 9.5  Total Protein 6.5 - 8.1 g/dL - - -  Total Bilirubin 0.3 - 1.2 mg/dL - - -  Alkaline Phos 38 - 126 U/L - - -  AST 15 - 41 U/L - - -  ALT 14 - 54 U/L - - -   Lab Results  Component Value Date   TSH 2.27 09/13/2015     Assessment & Plan:   1. Diabetes mellitus type 2, noninsulin dependent ([place]) Controlled with A1c of 6.6 Discussed symptoms of hypoglycemia and she is to notify the clinic if that occurs Foot exam performed today Advised to schedule annual eye exam. - Glucose (CBG) - HgB A1c - glipiZIDE (GLUCOTROL) 5 MG tablet; Take 1 tablet (5 mg total) by mouth daily.  Dispense: 90 tablet; Refill: 1 - COMPLETE METABOLIC PANEL WITH GFR - Lipid panel - Microalbumin / creatinine urine ratio  2. Hypothyroidism, unspecified type Last TSH was normal She is not on levothyroxine - TSH  3. Atrial fibrillation with rapid ventricular response ([name]) Currently in sinus rhythm Rate control with amiodarone - rivaroxaban (XARELTO) 20 MG TABS tablet; Take 1 tablet (20 mg total) by mouth daily.  Dispense: 90 tablet; Refill: 1  4. Other diabetic neurological complication associated with type 2 diabetes mellitus ([name]) Discussed sedating side effects of gabapentin - gabapentin (NEURONTIN) 300 MG capsule; Take 1 capsule (300 mg total) by mouth at bedtime.  Dispense: 90 capsule; Refill: 1  5. Stage 2 chronic kidney disease We'll send off complete metabolic panel to evaluate renal function Could also be Lasix induced  6. Ventricular tachycardia status post ICD in place- St Jude   7. Coronary artery disease involving native coronary artery of native heart without angina pectoris Three-vessel CAD Patient is refusing CABG Not currently on statin which she has refused in the past and we have had this discussion given she has a high cardiovascular risk. We'll send a lipid and initiate statin and the patient is agreeable  to this.  8. Chronic diastolic CHF (congestive heart failure) ([name]) EF 45-50% from 2-D echo of to/2017   Meds ordered this encounter  Medications  . glipiZIDE (GLUCOTROL) 5 MG tablet    Sig: Take 1 tablet (5 mg total) by mouth daily.    Dispense:  90 tablet    Refill:  1  . rivaroxaban (XARELTO) 20 MG TABS tablet    Sig: Take 1 tablet (  20 mg total) by mouth daily.    Dispense:  90 tablet    Refill:  1  . gabapentin (NEURONTIN) 300 MG capsule    Sig: Take 1 capsule (300 mg total) by mouth at bedtime.    Dispense:  90 capsule    Refill:  1    Follow-up: Return in about 1 month (around 07/16/2016) for Complete physical exam.   Arnoldo Morale MD

## 2016-06-17 ENCOUNTER — Telehealth: Payer: Self-pay | Admitting: Family Medicine

## 2016-06-17 ENCOUNTER — Telehealth: Payer: Self-pay

## 2016-06-17 ENCOUNTER — Other Ambulatory Visit: Payer: Self-pay | Admitting: Family Medicine

## 2016-06-17 ENCOUNTER — Other Ambulatory Visit: Payer: Self-pay

## 2016-06-17 DIAGNOSIS — I4891 Unspecified atrial fibrillation: Secondary | ICD-10-CM

## 2016-06-17 MED ORDER — ATORVASTATIN CALCIUM 20 MG PO TABS: 20.0000 mg | ORAL_TABLET | Freq: Every day | ORAL | 3 refills | Status: DC

## 2016-06-17 MED ORDER — RIVAROXABAN 20 MG PO TABS: 20.0000 mg | ORAL_TABLET | Freq: Every day | ORAL | 1 refills | Status: DC

## 2016-06-17 MED FILL — ATORVASTATIN 20 MG TABLET: 20 | 30 days supply | Qty: 30 | Fill #0

## 2016-06-17 NOTE — Telephone Encounter (Signed)
-----   Message from Arnoldo Morale, MD sent at 06/17/2016  2:19 PM EST ----- Labs reveal improvement in kidney function, normal thyroid however LDL (bad cholesterol) is elevated and so I have placed her on atorvastatin

## 2016-06-17 NOTE — Telephone Encounter (Signed)
Writer called patient regarding her labs.  Patient stated understanding and will be picking up medication here at the pharmacy.

## 2016-06-17 NOTE — Telephone Encounter (Signed)
Left patient a msg to inform her that her rx is ready to be picked up.

## 2016-06-20 MED FILL — glipiZIDE 5 MG TABS: 5 | 30 days supply | Qty: 30 | Fill #0

## 2016-07-14 ENCOUNTER — Telehealth: Payer: Self-pay

## 2016-07-14 NOTE — Telephone Encounter (Signed)
Writer called patient per Dr.Amao to let her know that she does not qualify at this time for assistance from [facility] [place] for her prescribed Xarelto.  LVM encouraging patient to call with any questions.

## 2016-07-21 MED FILL — XARELTO 20 MG TABLET: 20 | 30 days supply | Qty: 30 | Fill #1

## 2016-07-25 ENCOUNTER — Encounter: Payer: Self-pay | Admitting: Cardiology

## 2016-08-19 MED FILL — XARELTO 20 MG TABLET: 20 | 30 days supply | Qty: 30 | Fill #2

## 2016-08-19 MED FILL — glipiZIDE 5 MG TABS: 5 | 30 days supply | Qty: 30 | Fill #0

## 2016-09-17 MED FILL — XARELTO 20 MG TABLET: 20 | 30 days supply | Qty: 30 | Fill #3

## 2016-09-17 MED FILL — glipiZIDE 5 MG TABS: 5 | 30 days supply | Qty: 30 | Fill #1

## 2016-09-26 ENCOUNTER — Encounter: Payer: Self-pay | Admitting: Cardiology

## 2016-10-20 MED FILL — glipiZIDE 5 MG TABS: 5 | 30 days supply | Qty: 30 | Fill #2

## 2016-10-20 MED FILL — FUROSEMIDE 40 MG TABLET: 40 | 30 days supply | Qty: 60 | Fill #2

## 2016-10-22 MED FILL — XARELTO 20 MG TABLET: 20 | 30 days supply | Qty: 30 | Fill #4

## 2016-11-17 MED FILL — XARELTO 20 MG TABLET: 20 | 30 days supply | Qty: 30 | Fill #5

## 2016-11-27 ENCOUNTER — Encounter (HOSPITAL_COMMUNITY): Payer: Self-pay | Admitting: Emergency Medicine

## 2016-11-27 ENCOUNTER — Emergency Department (HOSPITAL_COMMUNITY): Payer: PPO

## 2016-11-27 ENCOUNTER — Inpatient Hospital Stay (HOSPITAL_COMMUNITY)
Admission: EM | Admit: 2016-11-27 | Discharge: 2017-01-08 | DRG: 233 | Disposition: A | Discharge status: Home / Self Care | Payer: PPO | Attending: Thoracic Surgery (Cardiothoracic Vascular Surgery) | Admitting: Thoracic Surgery (Cardiothoracic Vascular Surgery)

## 2016-11-27 DIAGNOSIS — Z833 Family history of diabetes mellitus: Secondary | ICD-10-CM | POA: Diagnosis not present

## 2016-11-27 DIAGNOSIS — Z7984 Long term (current) use of oral hypoglycemic drugs: Secondary | ICD-10-CM

## 2016-11-27 DIAGNOSIS — R0603 Acute respiratory distress: Secondary | ICD-10-CM

## 2016-11-27 DIAGNOSIS — I25810 Atherosclerosis of coronary artery bypass graft(s) without angina pectoris: Secondary | ICD-10-CM | POA: Diagnosis not present

## 2016-11-27 DIAGNOSIS — I4901 Ventricular fibrillation: Secondary | ICD-10-CM | POA: Diagnosis not present

## 2016-11-27 DIAGNOSIS — S0990XA Unspecified injury of head, initial encounter: Secondary | ICD-10-CM

## 2016-11-27 DIAGNOSIS — F419 Anxiety disorder, unspecified: Secondary | ICD-10-CM | POA: Diagnosis not present

## 2016-11-27 DIAGNOSIS — N183 Chronic kidney disease, stage 3 (moderate): Secondary | ICD-10-CM | POA: Diagnosis not present

## 2016-11-27 DIAGNOSIS — R112 Nausea with vomiting, unspecified: Secondary | ICD-10-CM | POA: Diagnosis not present

## 2016-11-27 DIAGNOSIS — I088 Other rheumatic multiple valve diseases: Secondary | ICD-10-CM | POA: Diagnosis not present

## 2016-11-27 DIAGNOSIS — I50812 Chronic right heart failure: Secondary | ICD-10-CM

## 2016-11-27 DIAGNOSIS — R0602 Shortness of breath: Secondary | ICD-10-CM

## 2016-11-27 DIAGNOSIS — K5901 Slow transit constipation: Secondary | ICD-10-CM | POA: Diagnosis not present

## 2016-11-27 DIAGNOSIS — N390 Urinary tract infection, site not specified: Secondary | ICD-10-CM | POA: Diagnosis not present

## 2016-11-27 DIAGNOSIS — I272 Pulmonary hypertension, unspecified: Secondary | ICD-10-CM | POA: Diagnosis present

## 2016-11-27 DIAGNOSIS — E854 Organ-limited amyloidosis: Secondary | ICD-10-CM | POA: Diagnosis not present

## 2016-11-27 DIAGNOSIS — I422 Other hypertrophic cardiomyopathy: Secondary | ICD-10-CM | POA: Diagnosis present

## 2016-11-27 DIAGNOSIS — I5023 Acute on chronic systolic (congestive) heart failure: Secondary | ICD-10-CM | POA: Diagnosis not present

## 2016-11-27 DIAGNOSIS — D62 Acute posthemorrhagic anemia: Secondary | ICD-10-CM | POA: Diagnosis not present

## 2016-11-27 DIAGNOSIS — K5900 Constipation, unspecified: Secondary | ICD-10-CM | POA: Diagnosis not present

## 2016-11-27 DIAGNOSIS — E1122 Type 2 diabetes mellitus with diabetic chronic kidney disease: Secondary | ICD-10-CM | POA: Diagnosis present

## 2016-11-27 DIAGNOSIS — R079 Chest pain, unspecified: Secondary | ICD-10-CM | POA: Diagnosis not present

## 2016-11-27 DIAGNOSIS — I480 Paroxysmal atrial fibrillation: Secondary | ICD-10-CM | POA: Diagnosis not present

## 2016-11-27 DIAGNOSIS — I5043 Acute on chronic combined systolic (congestive) and diastolic (congestive) heart failure: Secondary | ICD-10-CM | POA: Diagnosis present

## 2016-11-27 DIAGNOSIS — R310 Gross hematuria: Secondary | ICD-10-CM | POA: Diagnosis not present

## 2016-11-27 DIAGNOSIS — S0181XA Laceration without foreign body of other part of head, initial encounter: Secondary | ICD-10-CM | POA: Diagnosis not present

## 2016-11-27 DIAGNOSIS — Z95810 Presence of automatic (implantable) cardiac defibrillator: Secondary | ICD-10-CM

## 2016-11-27 DIAGNOSIS — E1140 Type 2 diabetes mellitus with diabetic neuropathy, unspecified: Secondary | ICD-10-CM | POA: Diagnosis present

## 2016-11-27 DIAGNOSIS — I2510 Atherosclerotic heart disease of native coronary artery without angina pectoris: Secondary | ICD-10-CM | POA: Diagnosis not present

## 2016-11-27 DIAGNOSIS — Z951 Presence of aortocoronary bypass graft: Secondary | ICD-10-CM | POA: Diagnosis not present

## 2016-11-27 DIAGNOSIS — I4891 Unspecified atrial fibrillation: Secondary | ICD-10-CM | POA: Diagnosis not present

## 2016-11-27 DIAGNOSIS — I5033 Acute on chronic diastolic (congestive) heart failure: Secondary | ICD-10-CM | POA: Diagnosis not present

## 2016-11-27 DIAGNOSIS — I252 Old myocardial infarction: Secondary | ICD-10-CM | POA: Diagnosis not present

## 2016-11-27 DIAGNOSIS — K029 Dental caries, unspecified: Secondary | ICD-10-CM

## 2016-11-27 DIAGNOSIS — I214 Non-ST elevation (NSTEMI) myocardial infarction: Secondary | ICD-10-CM | POA: Diagnosis not present

## 2016-11-27 DIAGNOSIS — R319 Hematuria, unspecified: Secondary | ICD-10-CM

## 2016-11-27 DIAGNOSIS — I25110 Atherosclerotic heart disease of native coronary artery with unstable angina pectoris: Secondary | ICD-10-CM | POA: Diagnosis not present

## 2016-11-27 DIAGNOSIS — I472 Ventricular tachycardia: Secondary | ICD-10-CM | POA: Diagnosis not present

## 2016-11-27 DIAGNOSIS — Z9889 Other specified postprocedural states: Secondary | ICD-10-CM

## 2016-11-27 DIAGNOSIS — J90 Pleural effusion, not elsewhere classified: Secondary | ICD-10-CM

## 2016-11-27 DIAGNOSIS — Z01810 Encounter for preprocedural cardiovascular examination: Secondary | ICD-10-CM | POA: Diagnosis not present

## 2016-11-27 DIAGNOSIS — R110 Nausea: Secondary | ICD-10-CM | POA: Diagnosis not present

## 2016-11-27 DIAGNOSIS — Z803 Family history of malignant neoplasm of breast: Secondary | ICD-10-CM

## 2016-11-27 DIAGNOSIS — Z823 Family history of stroke: Secondary | ICD-10-CM

## 2016-11-27 DIAGNOSIS — Z4502 Encounter for adjustment and management of automatic implantable cardiac defibrillator: Secondary | ICD-10-CM | POA: Diagnosis not present

## 2016-11-27 DIAGNOSIS — E875 Hyperkalemia: Secondary | ICD-10-CM | POA: Diagnosis not present

## 2016-11-27 DIAGNOSIS — Z4682 Encounter for fitting and adjustment of non-vascular catheter: Secondary | ICD-10-CM | POA: Diagnosis not present

## 2016-11-27 DIAGNOSIS — I340 Nonrheumatic mitral (valve) insufficiency: Secondary | ICD-10-CM | POA: Diagnosis not present

## 2016-11-27 DIAGNOSIS — K0530 Chronic periodontitis, unspecified: Secondary | ICD-10-CM | POA: Diagnosis present

## 2016-11-27 DIAGNOSIS — I452 Bifascicular block: Secondary | ICD-10-CM | POA: Diagnosis not present

## 2016-11-27 DIAGNOSIS — Z452 Encounter for adjustment and management of vascular access device: Secondary | ICD-10-CM | POA: Diagnosis not present

## 2016-11-27 DIAGNOSIS — L8990 Pressure ulcer of unspecified site, unspecified stage: Secondary | ICD-10-CM | POA: Insufficient documentation

## 2016-11-27 DIAGNOSIS — S0003XA Contusion of scalp, initial encounter: Secondary | ICD-10-CM | POA: Diagnosis not present

## 2016-11-27 DIAGNOSIS — Z8249 Family history of ischemic heart disease and other diseases of the circulatory system: Secondary | ICD-10-CM

## 2016-11-27 DIAGNOSIS — I517 Cardiomegaly: Secondary | ICD-10-CM | POA: Diagnosis not present

## 2016-11-27 DIAGNOSIS — B9620 Unspecified Escherichia coli [E. coli] as the cause of diseases classified elsewhere: Secondary | ICD-10-CM | POA: Diagnosis not present

## 2016-11-27 DIAGNOSIS — N179 Acute kidney failure, unspecified: Secondary | ICD-10-CM | POA: Diagnosis not present

## 2016-11-27 DIAGNOSIS — I509 Heart failure, unspecified: Secondary | ICD-10-CM | POA: Diagnosis not present

## 2016-11-27 DIAGNOSIS — Z9689 Presence of other specified functional implants: Secondary | ICD-10-CM

## 2016-11-27 DIAGNOSIS — I081 Rheumatic disorders of both mitral and tricuspid valves: Secondary | ICD-10-CM | POA: Diagnosis present

## 2016-11-27 DIAGNOSIS — J9811 Atelectasis: Secondary | ICD-10-CM | POA: Diagnosis not present

## 2016-11-27 DIAGNOSIS — Z87891 Personal history of nicotine dependence: Secondary | ICD-10-CM

## 2016-11-27 DIAGNOSIS — I4892 Unspecified atrial flutter: Secondary | ICD-10-CM | POA: Diagnosis not present

## 2016-11-27 DIAGNOSIS — I501 Left ventricular failure: Secondary | ICD-10-CM | POA: Diagnosis not present

## 2016-11-27 DIAGNOSIS — I255 Ischemic cardiomyopathy: Secondary | ICD-10-CM | POA: Diagnosis present

## 2016-11-27 DIAGNOSIS — E119 Type 2 diabetes mellitus without complications: Secondary | ICD-10-CM

## 2016-11-27 DIAGNOSIS — I481 Persistent atrial fibrillation: Secondary | ICD-10-CM | POA: Diagnosis not present

## 2016-11-27 DIAGNOSIS — E860 Dehydration: Secondary | ICD-10-CM | POA: Diagnosis not present

## 2016-11-27 DIAGNOSIS — K083 Retained dental root: Secondary | ICD-10-CM | POA: Diagnosis present

## 2016-11-27 DIAGNOSIS — B379 Candidiasis, unspecified: Secondary | ICD-10-CM | POA: Diagnosis not present

## 2016-11-27 DIAGNOSIS — T502X5A Adverse effect of carbonic-anhydrase inhibitors, benzothiadiazides and other diuretics, initial encounter: Secondary | ICD-10-CM | POA: Diagnosis not present

## 2016-11-27 DIAGNOSIS — R570 Cardiogenic shock: Secondary | ICD-10-CM | POA: Diagnosis not present

## 2016-11-27 DIAGNOSIS — I059 Rheumatic mitral valve disease, unspecified: Secondary | ICD-10-CM | POA: Diagnosis not present

## 2016-11-27 DIAGNOSIS — Z7901 Long term (current) use of anticoagulants: Secondary | ICD-10-CM

## 2016-11-27 DIAGNOSIS — R55 Syncope and collapse: Secondary | ICD-10-CM | POA: Diagnosis not present

## 2016-11-27 DIAGNOSIS — R0989 Other specified symptoms and signs involving the circulatory and respiratory systems: Secondary | ICD-10-CM | POA: Diagnosis not present

## 2016-11-27 DIAGNOSIS — I5021 Acute systolic (congestive) heart failure: Secondary | ICD-10-CM | POA: Diagnosis not present

## 2016-11-27 DIAGNOSIS — R918 Other nonspecific abnormal finding of lung field: Secondary | ICD-10-CM | POA: Diagnosis not present

## 2016-11-27 DIAGNOSIS — M810 Age-related osteoporosis without current pathological fracture: Secondary | ICD-10-CM | POA: Diagnosis present

## 2016-11-27 HISTORY — DX: Non-ST elevation (NSTEMI) myocardial infarction: I21.4

## 2016-11-27 LAB — COMPREHENSIVE METABOLIC PANEL
ALBUMIN: 3.5 g/dL (ref 3.5–5.0)
ALT: 14 U/L (ref 14–54)
AST: 16 U/L (ref 15–41)
Alkaline Phosphatase: 96 U/L (ref 38–126)
Anion gap: 10 (ref 5–15)
BILIRUBIN TOTAL: 0.9 mg/dL (ref 0.3–1.2)
BUN: 37 mg/dL — AB (ref 6–20)
CHLORIDE: 106 mmol/L (ref 101–111)
CO2: 21 mmol/L — ABNORMAL LOW (ref 22–32)
Calcium: 8.9 mg/dL (ref 8.9–10.3)
Creatinine, Ser: 1.17 mg/dL — ABNORMAL HIGH (ref 0.44–1.00)
GFR calc Af Amer: 54 mL/min — ABNORMAL LOW (ref >60)
GFR, EST NON AFRICAN AMERICAN: 47 mL/min — AB (ref >60)
GLUCOSE: 169 mg/dL — AB (ref 65–99)
POTASSIUM: 3.9 mmol/L (ref 3.5–5.1)
Sodium: 137 mmol/L (ref 135–145)
Total Protein: 6.9 g/dL (ref 6.5–8.1)

## 2016-11-27 LAB — URINALYSIS, ROUTINE W REFLEX MICROSCOPIC
BILIRUBIN URINE: NEGATIVE
Glucose, UA: NEGATIVE mg/dL
Ketones, ur: NEGATIVE mg/dL
LEUKOCYTES UA: NEGATIVE
NITRITE: NEGATIVE
Protein, ur: 30 mg/dL — AB
Specific Gravity, Urine: 1.015 (ref 1.005–1.030)
pH: 5 (ref 5.0–8.0)

## 2016-11-27 LAB — GLUCOSE, CAPILLARY
Glucose-Capillary: 141 mg/dL — ABNORMAL HIGH (ref 65–99)
Glucose-Capillary: 249 mg/dL — ABNORMAL HIGH (ref 65–99)

## 2016-11-27 LAB — MAGNESIUM: MAGNESIUM: 2 mg/dL (ref 1.7–2.4)

## 2016-11-27 LAB — CBC
HEMATOCRIT: 31.1 % — AB (ref 36.0–46.0)
HEMOGLOBIN: 9.7 g/dL — AB (ref 12.0–15.0)
MCH: 27.2 pg (ref 26.0–34.0)
MCHC: 31.2 g/dL (ref 30.0–36.0)
MCV: 87.1 fL (ref 78.0–100.0)
Platelets: 347 10*3/uL (ref 150–400)
RBC: 3.57 MIL/uL — ABNORMAL LOW (ref 3.87–5.11)
RDW: 14.9 % (ref 11.5–15.5)
WBC: 13.3 10*3/uL — ABNORMAL HIGH (ref 4.0–10.5)

## 2016-11-27 LAB — TROPONIN I
TROPONIN I: 1.92 ng/mL — AB (ref <0.03)
TROPONIN I: 1.42 ng/mL — AB (ref <0.03)
Troponin I: 1.56 ng/mL (ref <0.03)

## 2016-11-27 LAB — HEPARIN LEVEL (UNFRACTIONATED)

## 2016-11-27 LAB — APTT: aPTT: 34 seconds (ref 24–36)

## 2016-11-27 LAB — BRAIN NATRIURETIC PEPTIDE: B Natriuretic Peptide: 920 pg/mL — ABNORMAL HIGH (ref 0.0–100.0)

## 2016-11-27 MED ORDER — SENNOSIDES-DOCUSATE SODIUM 8.6-50 MG PO TABS
1.0000 | ORAL_TABLET | Freq: Two times a day (BID) | ORAL | Status: DC
  Administered 2016-11-27 – 2016-12-04 (×12): 1 via ORAL
  Filled 2016-11-27 (×13): qty 1

## 2016-11-27 MED ORDER — MEXILETINE HCL 150 MG PO CAPS
300.0000 mg | ORAL_CAPSULE | Freq: Three times a day (TID) | ORAL | Status: DC
  Administered 2016-11-27 – 2016-12-04 (×19): 300 mg via ORAL
  Filled 2016-11-27 (×24): qty 2

## 2016-11-27 MED ORDER — ASPIRIN 81 MG PO CHEW
81.0000 mg | CHEWABLE_TABLET | ORAL | Status: AC
  Administered 2016-11-28: 81 mg via ORAL
  Filled 2016-11-27: qty 1

## 2016-11-27 MED ORDER — ALPRAZOLAM 0.25 MG PO TABS
0.2500 mg | ORAL_TABLET | Freq: Three times a day (TID) | ORAL | Status: DC | PRN
  Administered 2016-12-04: 0.25 mg via ORAL
  Filled 2016-11-27: qty 1

## 2016-11-27 MED ORDER — HEPARIN (PORCINE) IN NACL 100-0.45 UNIT/ML-% IJ SOLN
1150.0000 [IU]/h | INTRAMUSCULAR | Status: DC
  Administered 2016-11-27: 1000 [IU]/h via INTRAVENOUS
  Filled 2016-11-27 (×2): qty 250

## 2016-11-27 MED ORDER — SODIUM CHLORIDE 0.9 % IV SOLN: 250.0000 mL | INTRAVENOUS | Status: DC | PRN

## 2016-11-27 MED ORDER — SODIUM CHLORIDE 0.9 % IV SOLN
250.0000 mL | INTRAVENOUS | Status: DC | PRN
  Administered 2016-12-04: 16:00:00 via INTRAVENOUS

## 2016-11-27 MED ORDER — SODIUM CHLORIDE 0.9 % IV SOLN: INTRAVENOUS | Status: DC

## 2016-11-27 MED ORDER — FUROSEMIDE 10 MG/ML IJ SOLN
40.0000 mg | Freq: Two times a day (BID) | INTRAMUSCULAR | Status: DC
  Administered 2016-11-27 – 2016-12-04 (×14): 40 mg via INTRAVENOUS
  Filled 2016-11-27 (×15): qty 4

## 2016-11-27 MED ORDER — SODIUM CHLORIDE 0.9% FLUSH: 3.0000 mL | INTRAVENOUS | Status: DC | PRN

## 2016-11-27 MED ORDER — SODIUM CHLORIDE 0.9% FLUSH
3.0000 mL | Freq: Two times a day (BID) | INTRAVENOUS | Status: DC
  Administered 2016-11-30: 3 mL via INTRAVENOUS

## 2016-11-27 MED ORDER — INSULIN ASPART 100 UNIT/ML ~~LOC~~ SOLN
0.0000 [IU] | Freq: Three times a day (TID) | SUBCUTANEOUS | Status: DC
  Administered 2016-11-28 (×2): 2 [IU] via SUBCUTANEOUS
  Administered 2016-11-29: 3 [IU] via SUBCUTANEOUS
  Administered 2016-11-29 – 2016-11-30 (×2): 2 [IU] via SUBCUTANEOUS
  Administered 2016-11-30 – 2016-12-01 (×3): 3 [IU] via SUBCUTANEOUS
  Administered 2016-12-02: 2 [IU] via SUBCUTANEOUS
  Administered 2016-12-02: 5 [IU] via SUBCUTANEOUS
  Administered 2016-12-03 (×2): 3 [IU] via SUBCUTANEOUS
  Administered 2016-12-04: 2 [IU] via SUBCUTANEOUS

## 2016-11-27 MED ORDER — FUROSEMIDE 10 MG/ML IJ SOLN: 40.0000 mg | Freq: Two times a day (BID) | INTRAMUSCULAR | Status: DC

## 2016-11-27 MED ORDER — ACETAMINOPHEN 325 MG PO TABS
650.0000 mg | ORAL_TABLET | ORAL | Status: DC | PRN
  Administered 2016-11-27 – 2016-11-28 (×2): 650 mg via ORAL
  Filled 2016-11-27 (×2): qty 2

## 2016-11-27 MED ORDER — ONDANSETRON HCL 4 MG/2ML IJ SOLN
4.0000 mg | Freq: Four times a day (QID) | INTRAMUSCULAR | Status: DC | PRN
  Filled 2016-11-27: qty 2

## 2016-11-27 MED ORDER — SODIUM CHLORIDE 0.9% FLUSH
3.0000 mL | Freq: Two times a day (BID) | INTRAVENOUS | Status: DC
  Administered 2016-11-27: 3 mL via INTRAVENOUS

## 2016-11-27 NOTE — ED Notes (Signed)
Admitting Provider at bedside. 

## 2016-11-27 NOTE — ED Notes (Signed)
Patient transported to CT 

## 2016-11-27 NOTE — ED Notes (Signed)
Pacemaker interrogated per Dr. Venora Maples' request.

## 2016-11-27 NOTE — Progress Notes (Signed)
ANTICOAGULATION CONSULT NOTE - Initial Consult  Pharmacy Consult for heparin Indication: chest pain/ACS and atrial fibrillation  No Known Allergies  Patient Measurements: Height: 5\' 4"  (162.6 cm) Weight: 173 lb 8 oz (78.7 kg) IBW/kg (Calculated) : 54.7 Heparin Dosing Weight: 70kg  Vital Signs: Temp: 96.5 F (35.8 C) (04/26 0130) Temp Source: Axillary (04/26 0130) BP: 131/79 (04/26 0500) Pulse Rate: 83 (04/26 0500)  Labs:  Recent Labs  11/27/16 0159  HGB 9.7*  HCT 31.1*  PLT 347  CREATININE 1.17*  TROPONINI 1.56*    Estimated Creatinine Clearance: 46.7 mL/min (A) (by C-G formula based on SCr of 1.17 mg/dL (H)).   Medical History: Past Medical History:  Diagnosis Date  . Abnormal PFT   . CAD (coronary artery disease), native coronary artery - 3 vessel 04/21/2015   a. NSTEMI 8-04/2015 felt 2/2 demand ischemia. b. 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  . Chronic diastolic CHF (congestive heart failure) ([place])   . Diabetes mellitus ([place])   . Diabetes mellitus without complication ([place])    dx on wed  . Hypertrophic cardiomyopathy ([place])   . Left atrial enlargement   . Mitral regurgitation    a. Echo 04/2015: moderate mitral regurgitation. b. F/u echo 05/2015: mild MR.  . Paroxysmal atrial fibrillation ([place])   . Shortness of breath dyspnea   . Tricuspid regurgitation    a. Echo 04/2015: Mod-severe TR. b. Not mentioned on echo 05/2015  . Ventricular tachycardia ([place])    a. s/p St. Jude ICD implanted 04/2015.     Assessment: 71yo female c/o sudden onset of dizziness followed by syncope/fall, believes she hit her head, no bleeding on head CT, troponin found to be elevated; pt on Xarelto for Afib, last dose 4/25 at 1800.  EDP requests heparin for ACS.  Goal of Therapy:  Heparin level 0.3-0.7 units/ml aPTT 66-102 seconds Monitor platelets by anticoagulation protocol: Yes   Plan:  Will f/u w/ admitting MD plan; if  heparin still indicated will start heparin gtt at 1000 units/hr 24hr after last Xarelto dose; monitor PTT, heparin levels, and CBC.  Wynona Neat, PharmD, BCPS  11/27/2016,5:25 AM

## 2016-11-27 NOTE — Consult Note (Signed)
ELECTROPHYSIOLOGY CONSULT NOTE    Patient ID: [name] MRN: [id number], DOB/AGE: May 25, 1946 71 y.o.  Admit date: 11/27/2016 Date of Consult: 11/27/2016   Primary Physician: Arnoldo Morale, MD Primary Cardiologist: Dr. Haroldine Laws Electrophysiologist: Dr. Lovena Le  Reason for Consultation: VF  HPI: [name] is a 71 y.o. female who is being seen today for the evaluation of VF at the request of Dr. Debara Pickett.  PMHx of CAD (3-vessel CAD by cath in 04/2015 --> CABG consideration in the future), record reports she has declined CABG,PAFib,  DM, VT w/ HCM and ICD in place, chronic CHF (diastolic), orthostatic hypotension and moderate pulmonary HTN who presents to [facility] ED on 11/27/2016 following a syncopal episode.     The patient reports she has been feeling quite well without any symptoms until about a week ago feeling unusually SOB with minimal activities.  She had no CP.  Was up to use the BR when she became very dizzy and fainted, upon arrival to ER her ICD was interrogated and noted she had VF and received multiple shocks, none of which the patient recalls.  LABS K+ 3.9 Mag  Ordered BUN/Creat 37/1.17 BNP 920 Trop I: 1.56 H/H 9.7/31.1 WBC 13.3 plts 347  Device information: SJM dual chamber ICD, implanted 04/16/15, Dr. Lovena Le Interrogation: Battery stable 9 episode, 3 NSVT, 6 VF The first 3 VF episodes received ATP during charge without success and 30J, the second 3 were straight 30J shocks Only last 2 of 6 episodes details are available to review Also noted AMS episodes that are AFib  Past Medical History:  Diagnosis Date  . Abnormal PFT   . CAD (coronary artery disease), native coronary artery - 3 vessel 04/21/2015   a. NSTEMI 8-04/2015 felt 2/2 demand ischemia. b. 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  . Chronic diastolic CHF (congestive heart failure) ([place])   . Diabetes mellitus ([place])   . Diabetes mellitus  without complication ([place])    dx on wed  . Hypertrophic cardiomyopathy ([place])   . Left atrial enlargement   . Mitral regurgitation    a. Echo 04/2015: moderate mitral regurgitation. b. F/u echo 05/2015: mild MR.  . Paroxysmal atrial fibrillation ([place])   . Shortness of breath dyspnea   . Tricuspid regurgitation    a. Echo 04/2015: Mod-severe TR. b. Not mentioned on echo 05/2015  . Ventricular tachycardia ([place])    a. s/p St. Jude ICD implanted 04/2015.     Surgical History:  Past Surgical History:  Procedure Laterality Date  . APPLICATION OF WOUND VAC Left 01/06/2013   Procedure: APPLICATION OF WOUND VAC X 2;  Surgeon: [name], MD;  Location: [facility] ORS;  Service: Orthopedics;  Laterality: Left;  left forearm  . APPLICATION OF WOUND VAC Left 01/09/2013   Procedure: APPLICATION OF WOUND VAC;  Surgeon: [name], MD;  Location: [facility]S;  Service: Orthopedics;  Laterality: Left;  . CARDIAC CATHETERIZATION N/A 04/19/2015   Procedure: Left Heart Cath and Coronary Angiography;  Surgeon: [name], MD;  Location: [facility] CV LAB;  Service: Cardiovascular;  Laterality: N/A;  . CARDIOVERSION N/A 09/20/2015   Procedure: CARDIOVERSION;  Surgeon: Pixie Casino, MD;  Location: [facility] ENDOSCOPY;  Service: Cardiovascular;  Laterality: N/A;  . DENTAL SURGERY  Oct. 2011   several extractions, bone graft  . EP IMPLANTABLE DEVICE N/A 04/16/2015   Procedure: ICD Implant;  Surgeon: [name], MD;  Location:  [facility] INVASIVE CV LAB;  Service: Cardiovascular;  Laterality: N/A;  . I&D EXTREMITY Left 01/06/2013   Procedure: IRRIGATION AND DEBRIDEMENT LEFT ELBOW AND LEFT FOREARM ;  Surgeon: [name], MD;  Location: [facility];  Service: Orthopedics;  Laterality: Left;  . INCISION AND DRAINAGE Left 01/09/2013   Procedure: REDO INCISION AND DRAINAGE LEFT ELBOW;  Surgeon: [name], MD;  Location: [facility];  Service: Orthopedics;  Laterality: Left;  SUPINE, UPPER EXTERMITY DRAPE  . SECONDARY CLOSURE  OF WOUND Left 01/09/2013   Procedure: SECONDARY CLOSURE OF WOUND  LEFT ELBOW;  Surgeon: [name], MD;  Location: [facility];  Service: Orthopedics;  Laterality: Left;  . TEE WITHOUT CARDIOVERSION N/A 09/20/2015   Procedure: TRANSESOPHAGEAL ECHOCARDIOGRAM (TEE);  Surgeon: Pixie Casino, MD;  Location: [facility] ENDOSCOPY;  Service: Cardiovascular;  Laterality: N/A;      (Not in a hospital admission)  Inpatient Medications:  . furosemide  40 mg Intravenous BID    Allergies: No Known Allergies  Social History   Social History  . Marital status: Single    Spouse name: N/A  . Number of children: N/A  . Years of education: N/A   Occupational History  . Not on file.   Social History Main Topics  . Smoking status: Former Smoker    Years: 4.00    Quit date: 12/31/1980  . Smokeless tobacco: Never Used  . Alcohol use No  . Drug use: No  . Sexual activity: Not Currently   Other Topics Concern  . Not on file   Social History Narrative   Divorced.  Lives with daughter and grandson. 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     Family History  Problem Relation Age of Onset  . Breast cancer Mother   . Diabetes Father   . Stroke Father   . Heart disease Sister   . Healthy Sister      Review of Systems: All other systems reviewed and are otherwise negative except as noted above.  Physical Exam: Vitals:   11/27/16 1000 11/27/16 1015 11/27/16 1048 11/27/16 1100  BP: 139/78 (!) 121/92 (!) 146/75 138/75  Pulse: 77 80 77 76  Resp: (!) 29 (!) 26 (!) 28 (!) 26  Temp:      TempSrc:      SpO2: 93% 94% 94% 95%  Weight:      Height:        GEN- The patient is well appearing, in NAD, eating lunch visiting with visitors, alert and oriented x 3 today.   HEENT: normocephalic, atraumatic; sclera clear, conjunctiva pink; hearing intact; oropharynx clear; neck supple, no JVP Lymph- no cervical lymphadenopathy Lungs- slightly diminished at the bases, normal work of breathing.  No wheezes,  rales, rhonchi Heart- RRR, no murmurs, rubs or gallops, PMI not laterally displaced GI- soft, non-tender, non-distended Extremities- no clubbing, cyanosis, 1+ edema MS- no significant deformity or atrophy Skin- warm and dry, no rash or lesion Psych- euthymic mood, full affect Neuro- no gross deficits observed  Labs:   Lab Results  Component Value Date   WBC 13.3 (H) 11/27/2016   HGB 9.7 (L) 11/27/2016   HCT 31.1 (L) 11/27/2016   MCV 87.1 11/27/2016   PLT 347 11/27/2016    Recent Labs Lab 11/27/16 0159  NA 137  K 3.9  CL 106  CO2 21*  BUN 37*  CREATININE 1.17*  CALCIUM 8.9  PROT 6.9  BILITOT 0.9  ALKPHOS 96  ALT 14  AST 16  GLUCOSE 169*      Radiology/Studies:  Dg Chest 2 View Result Date: 11/27/2016 CLINICAL DATA:  71 year old female with shortness of breath. History of CHF an AP. EXAM: CHEST  2 VIEW COMPARISON:  Chest radiograph dated 05/19/2016 FINDINGS: There is stable cardiomegaly. Streaky perihilar and central densities as well as diffuse interstitial prominence consistent with vascular congestion and edema. Pneumonia is not excluded. Small left pleural effusion with subsegmental atelectatic changes of the left lung base. Superimposed pneumonia is not excluded. Clinical correlation is recommended. Left pectoral dual lead AICD device. No acute osseous pathology. IMPRESSION: Cardiomegaly with findings of CHF and small left pleural effusion. Left lung base subsegmental atelectasis versus infiltrate. Electronically Signed   By: Anner Crete M.D.   On: 11/27/2016 02:43   Ct Head Wo Contrast Result Date: 11/27/2016 CLINICAL DATA:  71 year old female with possible head trauma. EXAM: CT HEAD WITHOUT CONTRAST CT CERVICAL SPINE WITHOUT CONTRAST TECHNIQUE: Multidetector CT imaging of the head and cervical spine was performed following the standard protocol without intravenous contrast. Multiplanar CT image reconstructions of the cervical spine were also generated. COMPARISON:   Head CT dated 05/11/2015 FINDINGS: CT HEAD FINDINGS Brain: The ventricles and sulci appropriate size for patient's age. Mild periventricular and deep white matter chronic microvascular ischemic changes noted. There is no acute intracranial hemorrhage. No mass effect or midline shift noted. No extra-axial fluid collections. Vascular: No hyperdense vessel or unexpected calcification. Skull: Normal. Negative for fracture or focal lesion. Sinuses/Orbits: There is a 2.1 x 1.8 cm calcified mass in the right maxillary sinus which appears to arise from the right maxillary bone. This lesion is incompletely visualized but appears new compared to the CT of 05/11/2015. This may represent an osteoma, ossifying fibroma or an odontogenic lesion. If the patient has symptoms of sinusitis or right-sided dental pain a nonemergent facial CT could be considered. No definite aggressive features noted, however disease lesion is partially visualized. The visualized paranasal sinuses and mastoid air cells are clear. No air-fluid levels. Other: Small left parietal scalp contusion. CT CERVICAL SPINE FINDINGS Alignment: No acute subluxation. Skull base and vertebrae: No acute fracture. No primary bone lesion or focal pathologic process. Soft tissues and spinal canal: No prevertebral fluid or swelling. No visible canal hematoma. Disc levels: There is degenerative changes with endplate irregularity and disc space narrowing and C6-C7. There is associated osteophyte at this level. Multilevel facet hypertrophy noted. Upper chest: Stop partially visualized bilateral pleural effusions. Left pectoral pacemaker device partially visualized. Probable calcified thyroid nodules. Ultrasound may provide better evaluation of the thyroid gland. Other: None IMPRESSION: 1. No acute intracranial hemorrhage. Mild chronic microvascular ischemic changes. 2. No acute/traumatic cervical spine pathology. 3. Rounded calcified mass in the right maxillary sinus, new  compared to the prior CT. This may represent an osteoma, an ossifying fibroma or an odontogenic lesion. Correlation with clinical exam recommended. CT of the maxillofacial bones on a non emergent basis may provide better evaluation. 4. Partially visualized bilateral pleural effusions. Electronically Signed   By: Anner Crete M.D.   On: 11/27/2016 03:00      Reviewed by myself: EKG: ST, 103bpm, PR 254ms, IVCD QRS 148ms, QTc 565ms TELEMETRY: SR, PACs  09/18/15: TTE Study Conclusions - Left ventricle: The cavity size was normal. There was moderate   focal basal hypertrophy. Systolic function was mildly reduced.   The estimated ejection fraction was in the range of 45% to 50%.   There is akinesis of the basalinferior myocardium. Cannot  exclude   akinesis of the entireinferolateral myocardium. The study is not   technically sufficient to allow evaluation of LV diastolic   function. Doppler parameters are consistent with high ventricular   filling pressure. - Mitral valve: Calcified annulus. Mild diffuse thickening of the   anterior leaflet and posterior leaflet. There was mild   regurgitation. - Left atrium: The atrium was severely dilated. - Pulmonary arteries: PA peak pressure: 46 mm Hg (S). - Pericardium, extracardiac: There was a left pleural effusion. Impressions: - The right ventricular systolic pressure was increased consistent   with moderate pulmonary hypertension.    Assessment and Plan:   1. Syncope/VF     6 episodes all successfully terminated with shocks     Devaun Hernandez start Mexiletine, highly suspect ischemic trigger given non-revascularized CAD  2. CAD     No CP but 1 week of unusual SOB     1st Trop abnormal     Plans for cath tomorrow     On heparin gtt     c/w cardiology team  3. PAFib     CHA2DS2Vasc is at least 5, on Xarelto out pt, held here for lanned procedure tomorrow     On heparin gtt  4. Hx of HCM w/ICD     Battery status is stable, device function  is intact    Signed, Tommye Standard, PA-C 11/27/2016 11:26 AM   I have seen and examined this patient with Tommye Standard.  Agree with above, note added to reflect my findings.  On exam, RRR, no murmurs, lungs clear. Presented to the hospital after shocks by her ICD. Interrogation shows ventricular fibrillation. Patient has been feeling poorly for the last few weeks. Plan for [facility] tomorrow to further define her conroary anatomy. Has declined bypass in the past. She does not wish to be on amiodarone as she had side effects in the past. Megan Presti start her on mexiletine today.    Nature Kueker M. Kathalene Sporer MD 11/27/2016 12:34 PM

## 2016-11-27 NOTE — ED Provider Notes (Signed)
[facility] DEPT Provider Note   CSN: [id number] Arrival date & time: 11/27/16  0124  By signing my name below, I, Marcello Moores, attest that this documentation has been prepared under the direction and in the presence of Jola Schmidt, MD. Electronically Signed: Marcello Moores, ED Scribe. 11/27/16. 2:52 AM.  History   Chief Complaint Chief Complaint  Patient presents with  . Fall  . Head Injury     The history is provided by the patient. No language interpreter was used.     HPI Comments: [name] is a 71 y.o. female with a PMHx of CHF, presents to the Emergency Department via EMS s/p syncopal episode and fall today complaining of a constant HA following the incident. Pt woke up to use the restroom when she had a sudden onset of dizziness. She remembers waking up on the floor, and believes she hit her head and on the bathroom drawer. Pt called out to her daughter who called EMS. She has not been able to ambulate, although she was able to stand with assistance from EMS.  Pt reports associated worsening generalized weakness with an onset on the evening of 11/26/2016 and left sided chest discomfort with deep breath. Pt also reports going to sleep experiencing SOB. She also complains of decreased appetite and "fullness in the abdomen". She believes this may be related to her CHF. She stopped taking her lasix as she did not have any swelling but restarted taking it 3-4 days; denies increase in urination. Pt also denies fever, chills, neck pain, vomiting, melena, and chest pain. Pt has a [employer] pacemaker defibrillator and is currently on Xarelto.   Past Medical History:  Diagnosis Date  . Abnormal PFT   . CAD (coronary artery disease), native coronary artery - 3 vessel 04/21/2015   a. NSTEMI 8-04/2015 felt 2/2 demand ischemia. b. 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  . Chronic diastolic CHF (congestive heart failure) ([place])     . Diabetes mellitus ([place])   . Diabetes mellitus without complication ([place])    dx on wed  . Hypertrophic cardiomyopathy ([place])   . Left atrial enlargement   . Mitral regurgitation    a. Echo 04/2015: moderate mitral regurgitation. b. F/u echo 05/2015: mild MR.  . Paroxysmal atrial fibrillation ([place])   . Shortness of breath dyspnea   . Tricuspid regurgitation    a. Echo 04/2015: Mod-severe TR. b. Not mentioned on echo 05/2015  . Ventricular tachycardia ([place])    a. s/p [employer] Jude ICD implanted 04/2015.    Patient Active Problem List   Diagnosis Date Noted  . Diabetic neuropathy ([place]) 06/16/2016  . Chronic kidney disease 06/16/2016  . Fatigue 10/30/2015  . Afib ([place]) 10/02/2015  . Chronic combined systolic and diastolic heart failure ([place]) 10/02/2015  . Atrial flutter ([place]) 09/18/2015  . Chronic anticoagulation-Xarelto 09/18/2015  . Acute CHF (congestive heart failure) ([place]) 09/18/2015  . Acute diastolic heart failure ([place]) 09/17/2015  . Orthostatic hypotension   . ICD in place- St Jude   . HLD (hyperlipidemia) 05/11/2015  . Syncope 05/11/2015  . Abdominal pain 05/11/2015  . AKI (acute kidney injury) ([place])   . Nausea & vomiting   . Hypothyroidism 04/27/2015  . Chronic diastolic CHF (congestive heart failure) ([place])   . Abnormal PFT   . CAD (coronary artery disease), native coronary artery - 3 vessel 04/21/2015  . Acute respiratory failure ([place])   . Ventricular tachycardia- Sept 2016-  ICD 04/13/2015  . Acute on chronic diastolic ACC/AHA stage C congestive heart failure ([facility])   . UTI (lower urinary tract infection) 03/31/2015  . Sepsis ([place]) 03/31/2015  . Atrial fibrillation with rapid ventricular response ([place]) 03/31/2015  . Congestive heart disease ([place])   . Hypertrophic cardiomyopathy ([place])   . Septic bursitis of elbow 02/03/2013  . Acute blood loss anemia 01/08/2013    Class: Acute  . Diabetes mellitus type 2, noninsulin dependent ([place])   . Cellulitis 12/31/2012  .  Hyponatremia 12/31/2012  . Hypokalemia 12/31/2012  . Fasting hyperglycemia 12/31/2012  . Leukocytosis 12/31/2012  . Hypotension 12/31/2012    Past Surgical History:  Procedure Laterality Date  . APPLICATION OF WOUND VAC Left 01/06/2013   Procedure: APPLICATION OF WOUND VAC X 2;  Surgeon: [name], MD;  Location: [facility] ORS;  Service: Orthopedics;  Laterality: Left;  left forearm  . APPLICATION OF WOUND VAC Left 01/09/2013   Procedure: APPLICATION OF WOUND VAC;  Surgeon: [name], MD;  Location: [facility];  Service: Orthopedics;  Laterality: Left;  . CARDIAC CATHETERIZATION N/A 04/19/2015   Procedure: Left Heart Cath and Coronary Angiography;  Surgeon: [name], MD;  Location: [facility] CV LAB;  Service: Cardiovascular;  Laterality: N/A;  . CARDIOVERSION N/A 09/20/2015   Procedure: CARDIOVERSION;  Surgeon: Pixie Casino, MD;  Location: [facility] ENDOSCOPY;  Service: Cardiovascular;  Laterality: N/A;  . DENTAL SURGERY  Oct. 2011   several extractions, bone graft  . EP IMPLANTABLE DEVICE N/A 04/16/2015   Procedure: ICD Implant;  Surgeon: [name], MD;  Location: [facility] CV LAB;  Service: Cardiovascular;  Laterality: N/A;  . I&D EXTREMITY Left 01/06/2013   Procedure: IRRIGATION AND DEBRIDEMENT LEFT ELBOW AND LEFT FOREARM ;  Surgeon: [name], MD;  Location: [facility];  Service: Orthopedics;  Laterality: Left;  . INCISION AND DRAINAGE Left 01/09/2013   Procedure: REDO INCISION AND DRAINAGE LEFT ELBOW;  Surgeon: [name], MD;  Location: [facility];  Service: Orthopedics;  Laterality: Left;  SUPINE, UPPER EXTERMITY DRAPE  . SECONDARY CLOSURE OF WOUND Left 01/09/2013   Procedure: SECONDARY CLOSURE OF WOUND  LEFT ELBOW;  Surgeon: [name], MD;  Location: [facility];  Service: Orthopedics;  Laterality: Left;  . TEE WITHOUT CARDIOVERSION N/A 09/20/2015   Procedure: TRANSESOPHAGEAL ECHOCARDIOGRAM (TEE);  Surgeon: Pixie Casino, MD;  Location[facility] ENDOSCOPY;  Service:  Cardiovascular;  Laterality: N/A;    OB History    Gravida Para Term Preterm AB Living             1   SAB TAB Ectopic Multiple Live Births                   Home Medications    Prior to Admission medications   Medication Sig Start Date End Date Taking? Authorizing Provider  acetaminophen (TYLENOL) 325 MG tablet Take 2 tablets (650 mg total) by mouth every 4 (four) hours as needed for headache or mild pain. 09/21/15   Erlene Quan, PA-C  amiodarone (PACERONE) 200 MG tablet Take 1 tablet (200 mg total) by mouth daily. 10/02/15   Amy D Ninfa Meeker, NP  atorvastatin (LIPITOR) 20 MG tablet Take 1 tablet (20 mg total) by mouth daily. 06/17/16   Arnoldo Morale, MD  Blood Glucose Monitoring Suppl (TRUE METRIX METER) DEVI 1 each by Does not apply route 2 (two) times daily. 04/27/15   Arnoldo Morale, MD  furosemide (LASIX) 40 MG tablet  Take 1 tablet (40 mg total) by mouth every other day. May take extra tablet once daily as needed for weight gain/edema. 01/02/16   Jolaine Artist, MD  gabapentin (NEURONTIN) 300 MG capsule Take 1 capsule (300 mg total) by mouth at bedtime. 06/16/16   Arnoldo Morale, MD  glipiZIDE (GLUCOTROL) 5 MG tablet Take 1 tablet (5 mg total) by mouth daily. 06/16/16   Arnoldo Morale, MD  glucose blood (TRUE METRIX BLOOD GLUCOSE TEST) test strip Use as instructed twice daily 04/27/15   Arnoldo Morale, MD  rivaroxaban (XARELTO) 20 MG TABS tablet Take 1 tablet (20 mg total) by mouth daily. 06/17/16   Arnoldo Morale, MD  TRUEPLUS LANCETS 28G MISC Use as instructed twice daily. 04/27/15   Arnoldo Morale, MD    Family History Family History  Problem Relation Age of Onset  . Breast cancer Mother   . Diabetes Father   . Stroke Father   . Heart disease Sister   . Healthy Sister     Social History Social History  Substance Use Topics  . Smoking status: Former Smoker    Years: 4.00    Quit date: 12/31/1980  . Smokeless tobacco: Never Used  . Alcohol use No     Allergies   Patient has no  known allergies.   Review of Systems Review of Systems All systems reviewed and are negative for acute change except as noted in the HPI.    Physical Exam Updated Vital Signs BP 131/78   Pulse 79   Temp (!) 96.5 F (35.8 C) (Axillary)   Resp (!) 23   SpO2 95%   Physical Exam  Constitutional: She is oriented to person, place, and time. She appears well-developed and well-nourished. No distress.  HENT:  Head: Normocephalic and atraumatic.  Eyes: EOM are normal.  Neck: Normal range of motion.  Cardiovascular: Normal rate, regular rhythm and normal heart sounds.   Pulmonary/Chest: Effort normal. She has rales.  Abdominal: Soft. She exhibits no distension. There is no tenderness.  Musculoskeletal: Normal range of motion.  Neurological: She is alert and oriented to person, place, and time.  Skin: Skin is warm and dry.  Small scalp laceration left parietal scalp without active bleeding  Psychiatric: She has a normal mood and affect. Judgment normal.  Nursing note and vitals reviewed.    ED Treatments / Results  DIAGNOSTIC STUDIES: Oxygen Saturation is 91% on RA, adequate by my interpretation.   COORDINATION OF CARE: 2:00 AM-Discussed next steps with pt. Pt verbalized understanding and is agreeable with the plan.    Labs (all labs ordered are listed, but only abnormal results are displayed) Labs Reviewed  CBC - Abnormal; Notable for the following:       Result Value   WBC 13.3 (*)    RBC 3.57 (*)    Hemoglobin 9.7 (*)    HCT 31.1 (*)    All other components within normal limits  COMPREHENSIVE METABOLIC PANEL - Abnormal; Notable for the following:    CO2 21 (*)    Glucose, Bld 169 (*)    BUN 37 (*)    Creatinine, Ser 1.17 (*)    GFR calc non Af Amer 47 (*)    GFR calc Af Amer 54 (*)    All other components within normal limits  TROPONIN I - Abnormal; Notable for the following:    Troponin I 1.56 (*)    All other components within normal limits  BRAIN  NATRIURETIC PEPTIDE - Abnormal; Notable  for the following:    B Natriuretic Peptide 920.0 (*)    All other components within normal limits  URINALYSIS, ROUTINE W REFLEX MICROSCOPIC    EKG  EKG Interpretation  Date/Time:  Thursday November 27 2016 01:30:38 EDT Ventricular Rate:  103 PR Interval:    QRS Duration: 119 QT Interval:  425 QTC Calculation: 557 R Axis:   102 Text Interpretation:  Sinus rhythm Atrial premature complexes Borderline prolonged PR interval Nonspecific intraventricular conduction delay Low voltage, precordial leads Consider anterior infarct Nonspecific T abnormalities, inferior leads Confirmed by Nahara Dona  MD, Lennette Bihari ([id number]) on 11/27/2016 1:33:26 AM       Radiology Dg Chest 2 View  Result Date: 11/27/2016 CLINICAL DATA:  70 year old female with shortness of breath. History of CHF an AP. EXAM: CHEST  2 VIEW COMPARISON:  Chest radiograph dated 05/19/2016 FINDINGS: There is stable cardiomegaly. Streaky perihilar and central densities as well as diffuse interstitial prominence consistent with vascular congestion and edema. Pneumonia is not excluded. Small left pleural effusion with subsegmental atelectatic changes of the left lung base. Superimposed pneumonia is not excluded. Clinical correlation is recommended. Left pectoral dual lead AICD device. No acute osseous pathology. IMPRESSION: Cardiomegaly with findings of CHF and small left pleural effusion. Left lung base subsegmental atelectasis versus infiltrate. Electronically Signed   By: Anner Crete M.D.   On: 11/27/2016 02:43   Ct Cervical Spine Wo Contrast  Result Date: 11/27/2016 CLINICAL DATA:  71 year old female with possible head trauma. EXAM: CT HEAD WITHOUT CONTRAST CT CERVICAL SPINE WITHOUT CONTRAST TECHNIQUE: Multidetector CT imaging of the head and cervical spine was performed following the standard protocol without intravenous contrast. Multiplanar CT image reconstructions of the cervical spine were also  generated. COMPARISON:  Head CT dated 05/11/2015 FINDINGS: CT HEAD FINDINGS Brain: The ventricles and sulci appropriate size for patient's age. Mild periventricular and deep white matter chronic microvascular ischemic changes noted. There is no acute intracranial hemorrhage. No mass effect or midline shift noted. No extra-axial fluid collections. Vascular: No hyperdense vessel or unexpected calcification. Skull: Normal. Negative for fracture or focal lesion. Sinuses/Orbits: There is a 2.1 x 1.8 cm calcified mass in the right maxillary sinus which appears to arise from the right maxillary bone. This lesion is incompletely visualized but appears new compared to the CT of 05/11/2015. This may represent an osteoma, ossifying fibroma or an odontogenic lesion. If the patient has symptoms of sinusitis or right-sided dental pain a nonemergent facial CT could be considered. No definite aggressive features noted, however disease lesion is partially visualized. The visualized paranasal sinuses and mastoid air cells are clear. No air-fluid levels. Other: Small left parietal scalp contusion. CT CERVICAL SPINE FINDINGS Alignment: No acute subluxation. Skull base and vertebrae: No acute fracture. No primary bone lesion or focal pathologic process. Soft tissues and spinal canal: No prevertebral fluid or swelling. No visible canal hematoma. Disc levels: There is degenerative changes with endplate irregularity and disc space narrowing and C6-C7. There is associated osteophyte at this level. Multilevel facet hypertrophy noted. Upper chest: Stop partially visualized bilateral pleural effusions. Left pectoral pacemaker device partially visualized. Probable calcified thyroid nodules. Ultrasound may provide better evaluation of the thyroid gland. Other: None IMPRESSION: 1. No acute intracranial hemorrhage. Mild chronic microvascular ischemic changes. 2. No acute/traumatic cervical spine pathology. 3. Rounded calcified mass in the right  maxillary sinus, new compared to the prior CT. This may represent an osteoma, an ossifying fibroma or an odontogenic lesion. Correlation with clinical exam recommended. CT of  the maxillofacial bones on a non emergent basis may provide better evaluation. 4. Partially visualized bilateral pleural effusions. Electronically Signed   By: Anner Crete M.D.   On: 11/27/2016 03:00     ++++++++++++++++++++++++++++++++++++++++++++++  Procedures .Critical Care Performed by: Jola Schmidt Authorized by: Jola Schmidt    Total critical care time: 31 minutes Critical care time was exclusive of separately billable procedures and treating other patients. Critical care was necessary to treat or prevent imminent or life-threatening deterioration. Critical care was time spent personally by me on the following activities: development of treatment plan with patient and/or surrogate as well as nursing, discussions with consultants, evaluation of patient's response to treatment, examination of patient, obtaining history from patient or surrogate, ordering and performing treatments and interventions, ordering and review of laboratory studies, ordering and review of radiographic studies, pulse oximetry and re-evaluation of patient's condition.   +++++++++++++++++++++++++++++++++++++++++++++++++++++++++  Medications Ordered in ED Medications - No data to display   Initial Impression / Assessment and Plan / ED Course  I have reviewed the triage vital signs and the nursing notes.  Pertinent labs & imaging results that were available during my care of the patient were reviewed by me and considered in my medical decision making (see chart for details).     Patient with 3 episodes of V. fib.  2 tonight.  This is likely the cause of her syncope.  She was defibrillated a total of 6 times in the past 3 days.  Patient has known three-vessel coronary disease and is still reluctant to undergo CABG.  She's otherwise  been compliant with her medications.  She does have symptoms consistent with CHF exacerbation of the past several days including generalized weakness and increasing shortness of breath.  Patient will be started on heparin at this time for presumed non-STEMI.  Consult cardiology.  Final Clinical Impressions(s) / ED Diagnoses   Final diagnoses:  Head injury  Ventricular fibrillation [facility])    New Prescriptions New Prescriptions   No medications on file   I personally performed the services described in this documentation, which was scribed in my presence. The recorded information has been reviewed and is accurate.        Jola Schmidt, MD 11/27/16 [phone]

## 2016-11-27 NOTE — Plan of Care (Signed)
Problem: Education: Goal: Understanding of CV disease, CV risk reduction, and recovery process will improve Outcome: Progressing Discussed rationale for Cardiac Cath

## 2016-11-27 NOTE — ED Notes (Signed)
Spoke with Keenan Bachelor from [facility] who states that pt pacer interrogator shows serveral episodes of VF and that she has had 6 six shocks in the last 24hrs. EDP made aware. Awaiting fax.

## 2016-11-27 NOTE — ED Triage Notes (Signed)
Per EMS pt was at home on the toilet having a bowel movement when she felt dizzy and fell against the counter hitting her head. Laceration noted. Is on Xarelto. No neuro deficits. Weak and nauseated.  No appetite lately. Has atrial pacemaker.  Pt is vomiting on triage assessment.

## 2016-11-27 NOTE — H&P (Signed)
History & Physical    Patient ID: [name] MRN: [id number], DOB/AGE: [birth year]   Admit date: 11/27/2016   Primary Physician: Arnoldo Morale, MD Primary Cardiologist: Dr. Haroldine Laws Primary Electrophysiologist: Dr. Lovena Le   History of Present Illness    [name] is a 71 y.o. female with past medical history of CAD (3-vessel CAD by cath in 04/2015 --> CABG consideration in the future), PAF (on Xarelto), Type 2 DM, VT (s/p placement of St. Jude ICD in 04/2015), chronic diastolic CHF, orthostatic hypotension and moderate pulmonary HTN who presents to [facility] ED on 11/27/2016 following a syncopal episode.     She was last examined by Dr. Haroldine Laws in 03/2016 and volume status was stable at that time. A direct consult with TCTS was offered but she stated she did not want surgery. Her last device interrogation was in 01/2016 and showed < 1% AT/AF burden with stable thoracic impedance.   She presents to the ED this morning as when she was using the restroom overnight, she developed sudden onset dizziness and had a syncopal episode and hit her head. Unsure how long she lost consciousness for. She denies any recent chest pain or palpitations. Reports worsening abdominal distension and dyspnea at rest and with exertion for the past week. Notes associated orthopnea and lower extremity edema.   Her ICD has been interrogated and shows 6 episodes of ventricular fibrillation occurring overnight with the initial 3 episodes requiring ATP and 30J (at approximately 2353 - 2354 on 4/25) with recurrent episodes at 0013 and 0014 at which her rate was in the 350's and 30J was delivered with termination of the rhythm.   EKG from EMS arrival shows significant ST depression along the inferior leads.    Initial labs show WBC of 13.3, Hgb 9.7, platelets 347. Na+ 137, K+ 3.9, creatinine 1.17. BNP 920. Troponin 1.56. CXR shows cardiomegaly with small left pleural effusion. CT Head with no acute  intracranial abnormalities. EKG shows NSR, HR 103, with incomplete LBBB.   Past Medical History    Past Medical History:  Diagnosis Date  . Abnormal PFT   . CAD (coronary artery disease), native coronary artery - 3 vessel 04/21/2015   a. NSTEMI 8-04/2015 felt 2/2 demand ischemia. b. 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  . Chronic diastolic CHF (congestive heart failure) ([place])   . Diabetes mellitus ([place])   . Diabetes mellitus without complication ([place])    dx on wed  . Hypertrophic cardiomyopathy ([place])   . Left atrial enlargement   . Mitral regurgitation    a. Echo 04/2015: moderate mitral regurgitation. b. F/u echo 05/2015: mild MR.  . Paroxysmal atrial fibrillation ([place])   . Shortness of breath dyspnea   . Tricuspid regurgitation    a. Echo 04/2015: Mod-severe TR. b. Not mentioned on echo 05/2015  . Ventricular tachycardia ([place])    a. s/p St. Jude ICD implanted 04/2015.    Past Surgical History:  Procedure Laterality Date  . APPLICATION OF WOUND VAC Left 01/06/2013   Procedure: APPLICATION OF WOUND VAC X 2;  Surgeon: [name], MD;  Location: [facility];  Service: Orthopedics;  Laterality: Left;  left forearm  . APPLICATION OF WOUND VAC Left 01/09/2013   Procedure: APPLICATION OF WOUND VAC;  Surgeon: [name], MD;  Location: [facility];  Service: Orthopedics;  Laterality: Left;  . CARDIAC CATHETERIZATION N/A 04/19/2015   Procedure: Left Heart Cath and  Coronary Angiography;  Surgeon: [name], MD;  Location: [place] [facility];  Service: Cardiovascular;  Laterality: N/A;  . CARDIOVERSION N/A 09/20/2015   Procedure: CARDIOVERSION;  Surgeon: Pixie Casino, MD;  Location: [facility] ENDOSCOPY;  Service: Cardiovascular;  Laterality: N/A;  . DENTAL SURGERY  Oct. 2011   several extractions, bone graft  . EP IMPLANTABLE DEVICE N/A 04/16/2015   Procedure: ICD Implant;  Surgeon: [name], MD;  Location: [place] [facility] LAB;   Service: Cardiovascular;  Laterality: N/A;  . I&D EXTREMITY Left 01/06/2013   Procedure: IRRIGATION AND DEBRIDEMENT LEFT ELBOW AND LEFT FOREARM ;  Surgeon: [name], MD;  Location: [facility];  Service: Orthopedics;  Laterality: Left;  . INCISION AND DRAINAGE Left 01/09/2013   Procedure: REDO INCISION AND DRAINAGE LEFT ELBOW;  Surgeon: [name], MD;  Location: [facility];  Service: Orthopedics;  Laterality: Left;  SUPINE, UPPER EXTERMITY DRAPE  . SECONDARY CLOSURE OF WOUND Left 01/09/2013   Procedure: SECONDARY CLOSURE OF WOUND  LEFT ELBOW;  Surgeon: [name], MD;  Location: [facility];  Service: Orthopedics;  Laterality: Left;  . TEE WITHOUT CARDIOVERSION N/A 09/20/2015   Procedure: TRANSESOPHAGEAL ECHOCARDIOGRAM (TEE);  Surgeon: Pixie Casino, MD;  Location: [facility] ENDOSCOPY;  Service: Cardiovascular;  Laterality: N/A;     Allergies  No Known Allergies   Home Medications    Prior to Admission medications   Medication Sig Start Date End Date Taking? Authorizing Provider  acetaminophen (TYLENOL) 325 MG tablet Take 2 tablets (650 mg total) by mouth every 4 (four) hours as needed for headache or mild pain. 09/21/15  Yes Luke K Kilroy, PA-C  Blood Glucose Monitoring Suppl (TRUE METRIX METER) DEVI 1 each by Does not apply route 2 (two) times daily. 04/27/15  Yes Arnoldo Morale, MD  furosemide (LASIX) 40 MG tablet Take 1 tablet (40 mg total) by mouth every other day. May take extra tablet once daily as needed for weight gain/edema. 01/02/16  Yes Shaune Pascal Bensimhon, MD  glipiZIDE (GLUCOTROL) 5 MG tablet Take 1 tablet (5 mg total) by mouth daily. 06/16/16  Yes Arnoldo Morale, MD  glucose blood (TRUE METRIX BLOOD GLUCOSE TEST) test strip Use as instructed twice daily 04/27/15  Yes Arnoldo Morale, MD  rivaroxaban (XARELTO) 20 MG TABS tablet Take 1 tablet (20 mg total) by mouth daily. 06/17/16  Yes Arnoldo Morale, MD  TRUEPLUS LANCETS 28G MISC Use as instructed twice daily. 04/27/15  Yes Arnoldo Morale, MD     Family History    Family History  Problem Relation Age of Onset  . Breast cancer Mother   . Diabetes Father   . Stroke Father   . Heart disease Sister   . Healthy Sister     Social History    Social History   Social History  . Marital status: Single    Spouse name: N/A  . Number of children: N/A  . Years of education: N/A   Occupational History  . Not on file.   Social History Main Topics  . Smoking status: Former Smoker    Years: 4.00    Quit date: 12/31/1980  . Smokeless tobacco: Never Used  . Alcohol use No  . Drug use: No  . Sexual activity: Not Currently   Other Topics Concern  . Not on file   Social History Narrative   Divorced.  Lives with daughter and grandson. 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     Review  of Systems    General:  No chills, fever, night sweats or weight changes.  Cardiovascular:  No chest pain, palpitations, paroxysmal nocturnal dyspnea. Positive for orthopnea, dyspnea on exertion, and edema.  Dermatological: No rash, lesions/masses Respiratory: No cough, dyspnea Urologic: No hematuria, dysuria Abdominal:   No nausea, vomiting, diarrhea, bright red blood per rectum, melena, or hematemesis Neurologic:  No visual changes, wkns, changes in mental status. All other systems reviewed and are otherwise negative except as noted above.  Physical Exam    Blood pressure 109/86, pulse 68, temperature (!) 96.5 F (35.8 C), temperature source Axillary, resp. rate (!) 23, height 5\' 4"  (1.626 m), weight 173 lb 8 oz (78.7 kg), SpO2 95 %.  General: Well developed, Caucasian female in no acute distress. Head: Normocephalic, sclera non-icteric, no xanthomas, nares are without discharge. Non-bleeding laceration noted.  Neck: No carotid bruits. JVD at 9cm.  Lungs: Respirations regular and unlabored, decreased breath sounds along bases bilaterally.  Heart: Regular rate and rhythm. No S3 or S4.  No murmur, no rubs, or gallops  appreciated. Abdomen: Soft, non-tender, with normoactive bowel sounds. No hepatomegaly. No rebound/guarding. No obvious abdominal masses. Appears distended. Msk:  Strength and tone appear normal for age. No joint deformities or effusions. Extremities: No clubbing or cyanosis. 1+ pitting edema along lower extremities bilaterally.  Distal pedal pulses are 2+ bilaterally. Neuro: Alert and oriented X 3. Moves all extremities spontaneously. No focal deficits noted. Psych:  Responds to questions appropriately with a normal affect. Skin: No rashes or lesions noted  Labs    Troponin (Point of Care Test) No results for input(s): TROPIPOC in the last 72 hours.  Recent Labs  11/27/16 0159  TROPONINI 1.56*   Lab Results  Component Value Date   WBC 13.3 (H) 11/27/2016   HGB 9.7 (L) 11/27/2016   HCT 31.1 (L) 11/27/2016   MCV 87.1 11/27/2016   PLT 347 11/27/2016     Recent Labs Lab 11/27/16 0159  NA 137  K 3.9  CL 106  CO2 21*  BUN 37*  CREATININE 1.17*  CALCIUM 8.9  PROT 6.9  BILITOT 0.9  ALKPHOS 96  ALT 14  AST 16  GLUCOSE 169*   Lab Results  Component Value Date   CHOL 190 06/16/2016   HDL 45 (L) 06/16/2016   LDLCALC 118 (H) 06/16/2016   TRIG 134 06/16/2016   No results found for: DDIMER   Brain Natriuretic Peptide  Date/Time Value Ref Range Status  09/13/2015 11:58 AM 398.6 (H) <100 pg/mL Final    Comment:      BNP levels increase with age in the general population with the highest values seen in individuals greater than 29 years of age. Reference: Joellyn Rued Cardiol 2002; 69:678-93.   ** Please note change in reference range(s). **      B Natriuretic Peptide  Date/Time Value Ref Range Status  11/27/2016 01:59 AM 920.0 (H) 0.0 - 100.0 pg/mL Final  09/17/2015 10:10 PM 784.3 (H) 0.0 - 100.0 pg/mL Final  09/17/2015 05:28 PM 654.9 (H) 0.0 - 100.0 pg/mL Final   Pro B Natriuretic peptide (BNP)  Date/Time Value Ref Range Status  01/02/2013 03:54 AM 6,103.0 (H) 0  - 125 pg/mL Final   No results for input(s): INR in the last 72 hours.    Radiology Studies    Dg Chest 2 View  Result Date: 11/27/2016 CLINICAL DATA:  71 year old female with shortness of breath. History of CHF an AP. EXAM: CHEST  2  VIEW COMPARISON:  Chest radiograph dated 05/19/2016 FINDINGS: There is stable cardiomegaly. Streaky perihilar and central densities as well as diffuse interstitial prominence consistent with vascular congestion and edema. Pneumonia is not excluded. Small left pleural effusion with subsegmental atelectatic changes of the left lung base. Superimposed pneumonia is not excluded. Clinical correlation is recommended. Left pectoral dual lead AICD device. No acute osseous pathology. IMPRESSION: Cardiomegaly with findings of CHF and small left pleural effusion. Left lung base subsegmental atelectasis versus infiltrate. Electronically Signed   By: Anner Crete M.D.   On: 11/27/2016 02:43   Ct Cervical Spine Wo Contrast  Result Date: 11/27/2016 CLINICAL DATA:  71 year old female with possible head trauma. EXAM: CT HEAD WITHOUT CONTRAST CT CERVICAL SPINE WITHOUT CONTRAST TECHNIQUE: Multidetector CT imaging of the head and cervical spine was performed following the standard protocol without intravenous contrast. Multiplanar CT image reconstructions of the cervical spine were also generated. COMPARISON:  Head CT dated 05/11/2015 FINDINGS: CT HEAD FINDINGS Brain: The ventricles and sulci appropriate size for patient's age. Mild periventricular and deep white matter chronic microvascular ischemic changes noted. There is no acute intracranial hemorrhage. No mass effect or midline shift noted. No extra-axial fluid collections. Vascular: No hyperdense vessel or unexpected calcification. Skull: Normal. Negative for fracture or focal lesion. Sinuses/Orbits: There is a 2.1 x 1.8 cm calcified mass in the right maxillary sinus which appears to arise from the right maxillary bone. This lesion  is incompletely visualized but appears new compared to the CT of 05/11/2015. This may represent an osteoma, ossifying fibroma or an odontogenic lesion. If the patient has symptoms of sinusitis or right-sided dental pain a nonemergent facial CT could be considered. No definite aggressive features noted, however disease lesion is partially visualized. The visualized paranasal sinuses and mastoid air cells are clear. No air-fluid levels. Other: Small left parietal scalp contusion. CT CERVICAL SPINE FINDINGS Alignment: No acute subluxation. Skull base and vertebrae: No acute fracture. No primary bone lesion or focal pathologic process. Soft tissues and spinal canal: No prevertebral fluid or swelling. No visible canal hematoma. Disc levels: There is degenerative changes with endplate irregularity and disc space narrowing and C6-C7. There is associated osteophyte at this level. Multilevel facet hypertrophy noted. Upper chest: Stop partially visualized bilateral pleural effusions. Left pectoral pacemaker device partially visualized. Probable calcified thyroid nodules. Ultrasound may provide better evaluation of the thyroid gland. Other: None IMPRESSION: 1. No acute intracranial hemorrhage. Mild chronic microvascular ischemic changes. 2. No acute/traumatic cervical spine pathology. 3. Rounded calcified mass in the right maxillary sinus, new compared to the prior CT. This may represent an osteoma, an ossifying fibroma or an odontogenic lesion. Correlation with clinical exam recommended. CT of the maxillofacial bones on a non emergent basis may provide better evaluation. 4. Partially visualized bilateral pleural effusions. Electronically Signed   By: Anner Crete M.D.   On: 11/27/2016 03:00    EKG & Cardiac Imaging    EKG:  NSR, HR 103, with incomplete LBBB - Personally Reviewed  ECHOCARDIOGRAM: 09/18/2015 Study Conclusions  - Left ventricle: The cavity size was normal. There was moderate   focal basal  hypertrophy. Systolic function was mildly reduced.   The estimated ejection fraction was in the range of 45% to 50%.   There is akinesis of the basalinferior myocardium. Cannot exclude   akinesis of the entireinferolateral myocardium. The study is not   technically sufficient to allow evaluation of LV diastolic   function. Doppler parameters are consistent with high ventricular  filling pressure. - Mitral valve: Calcified annulus. Mild diffuse thickening of the   anterior leaflet and posterior leaflet. There was mild   regurgitation. - Left atrium: The atrium was severely dilated. - Pulmonary arteries: PA peak pressure: 46 mm Hg (S). - Pericardium, extracardiac: There was a left pleural effusion.  Impressions:  - The right ventricular systolic pressure was increased consistent   with moderate pulmonary hypertension.  Cardiac Catheterization: 04/2015  There is mild left ventricular systolic dysfunction.  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  Severe three-vessel coronary artery disease including mid LAD, diagonal; ramus branch and sequential OM's; chronically totaled RCA with left to right collaterals. The OM 3 chronic total occlusion fed by left to left collaterals.   Surgical consultation to be obtained. We'll hold off on restarting Xarelto until surgical plans have been clarified. Gentle hydration for several hours postprocedure. Long-term, she'll need aggressive secondary prevention.  Assessment & Plan    1. Ventricular Fibrillation - s/p placement of St. Jude ICD in 04/2015. - experienced dizziness followed by a syncopal episode this AM. Denies any chest pain, palpitations, or acute dyspnea.  - device interrogation shows 6 episodes of ventricular fibrillation occurring overnight with the initial 3 episodes requiring ATP and 30J (at approximately 2353 - 2354 on 4/25) with recurrent episodes at 0013 and 0014 at which her rate was  in the 350's and 30J was delivered with termination of the rhythm.  - continue to monitor on telemetry. Check Mg. Further ischemic evaluation as below. Will ask EP to see in consult later today.  2. NSTEMI/CAD - the patient has known 3-vessel CAD by cath in 04/2015. CABG was recommended but she has continued to decline surgery.  - denies any recent chest pain but has experienced worsening dyspnea.  - initial troponin elevated to 1.56. Cycle troponin enzymes. - plan for repeat cardiac catheterization later this admission once respiratory status improves and Xarelto has been held for > 36 hours. Will add for schedule tomorrow. - not on ASA secondary to need for Xarelto. Not on BB therapy secondary to baseline bradycardia and hypotension. Check FLP.   3.  PAF - This patients CHA2DS2-VASc Score and unadjusted Ischemic Stroke Rate (% per year) is equal to 7.2 % stroke rate/year from a score of 5 (CHF, DM, Vascular, Age, Female). Xarelto held. Started on Heparin for NSTEMI and in anticipation of cardiac catheterization.   4. Acute on Chronic Diastolic CHF - EF 24-26% by echo in 09/2015. BNP is elevated to 920 and CXR consistent with CHF. - will start IV Lasix 40mg  BID. Follow I&O's along with daily weights. Daily BMET while receiving IV diuresis.  - repeat echocardiogram pending to assess LV function and wall motion.    5. Prolonged QT - ATc at 557 ms on initial EKG. Not on any QT prolonging medications as an outpatient. - Will check Mg. Repeat EKG last today.   6. Type 2 DM - SSI while admitted.  7. Chronic Anemia - Hgb at 9.7 on admission. She denies any evidence of active bleeding.  - continue to follow.   8. Constipation  - reports her last BM was 4+ days ago and her abdomen appears distended.  - start Senokot-S. If no improvement, consider enema.    Signed, Kayla Heritage, PA-C 11/27/2016, 7:37 AM Pager: [phone]

## 2016-11-28 ENCOUNTER — Encounter (HOSPITAL_COMMUNITY): Payer: Self-pay | Admitting: Interventional Cardiology

## 2016-11-28 ENCOUNTER — Inpatient Hospital Stay (HOSPITAL_COMMUNITY): Payer: PPO

## 2016-11-28 ENCOUNTER — Encounter (HOSPITAL_COMMUNITY)
Admission: EM | Disposition: A | Discharge status: Home / Self Care | Payer: Self-pay | Source: Home / Self Care | Attending: Thoracic Surgery (Cardiothoracic Vascular Surgery)

## 2016-11-28 DIAGNOSIS — I25110 Atherosclerotic heart disease of native coronary artery with unstable angina pectoris: Secondary | ICD-10-CM

## 2016-11-28 DIAGNOSIS — I2510 Atherosclerotic heart disease of native coronary artery without angina pectoris: Secondary | ICD-10-CM

## 2016-11-28 HISTORY — PX: LEFT HEART CATH AND CORONARY ANGIOGRAPHY: CATH118249

## 2016-11-28 LAB — BASIC METABOLIC PANEL
ANION GAP: 9 (ref 5–15)
BUN: 36 mg/dL — AB (ref 6–20)
CALCIUM: 8.6 mg/dL — AB (ref 8.9–10.3)
CO2: 22 mmol/L (ref 22–32)
Chloride: 107 mmol/L (ref 101–111)
Creatinine, Ser: 1.19 mg/dL — ABNORMAL HIGH (ref 0.44–1.00)
GFR calc Af Amer: 53 mL/min — ABNORMAL LOW (ref >60)
GFR calc non Af Amer: 46 mL/min — ABNORMAL LOW (ref >60)
GLUCOSE: 159 mg/dL — AB (ref 65–99)
Potassium: 4 mmol/L (ref 3.5–5.1)
Sodium: 138 mmol/L (ref 135–145)

## 2016-11-28 LAB — CBC
HEMATOCRIT: 29.4 % — AB (ref 36.0–46.0)
Hemoglobin: 9.2 g/dL — ABNORMAL LOW (ref 12.0–15.0)
MCH: 27.3 pg (ref 26.0–34.0)
MCHC: 31.3 g/dL (ref 30.0–36.0)
MCV: 87.2 fL (ref 78.0–100.0)
PLATELETS: 319 10*3/uL (ref 150–400)
RBC: 3.37 MIL/uL — ABNORMAL LOW (ref 3.87–5.11)
RDW: 15.3 % (ref 11.5–15.5)
WBC: 7.9 10*3/uL (ref 4.0–10.5)

## 2016-11-28 LAB — LIPID PANEL
CHOL/HDL RATIO: 4.2 ratio
Cholesterol: 139 mg/dL (ref 0–200)
HDL: 33 mg/dL — AB (ref >40)
LDL CALC: 88 mg/dL (ref 0–99)
Triglycerides: 88 mg/dL (ref <150)
VLDL: 18 mg/dL (ref 0–40)

## 2016-11-28 LAB — GLUCOSE, CAPILLARY
GLUCOSE-CAPILLARY: 115 mg/dL — AB (ref 65–99)
GLUCOSE-CAPILLARY: 140 mg/dL — AB (ref 65–99)
GLUCOSE-CAPILLARY: 136 mg/dL — AB (ref 65–99)
Glucose-Capillary: 143 mg/dL — ABNORMAL HIGH (ref 65–99)

## 2016-11-28 LAB — APTT: APTT: 47 s — AB (ref 24–36)

## 2016-11-28 LAB — TROPONIN I: TROPONIN I: 1.29 ng/mL — AB (ref <0.03)

## 2016-11-28 SURGERY — LEFT HEART CATH AND CORONARY ANGIOGRAPHY
Anesthesia: LOCAL

## 2016-11-28 MED ORDER — METOPROLOL TARTRATE 5 MG/5ML IV SOLN
INTRAVENOUS | Status: DC | PRN
  Administered 2016-11-28: 2.5 mg via INTRAVENOUS

## 2016-11-28 MED ORDER — MAGNESIUM HYDROXIDE 400 MG/5ML PO SUSP
30.0000 mL | Freq: Every day | ORAL | Status: DC | PRN
  Filled 2016-11-28: qty 30

## 2016-11-28 MED ORDER — IOPAMIDOL (ISOVUE-370) INJECTION 76%
INTRAVENOUS | Status: DC | PRN
Start: 2016-11-28 — End: 2016-11-28
  Administered 2016-11-28: 100 mL via INTRA_ARTERIAL

## 2016-11-28 MED ORDER — MIDAZOLAM HCL 2 MG/2ML IJ SOLN
INTRAMUSCULAR | Status: AC
  Filled 2016-11-28: qty 2

## 2016-11-28 MED ORDER — HEPARIN SODIUM (PORCINE) 1000 UNIT/ML IJ SOLN
INTRAMUSCULAR | Status: AC
  Filled 2016-11-28: qty 1

## 2016-11-28 MED ORDER — FENTANYL CITRATE (PF) 100 MCG/2ML IJ SOLN
INTRAMUSCULAR | Status: AC
  Filled 2016-11-28: qty 2

## 2016-11-28 MED ORDER — HEPARIN (PORCINE) IN NACL 2-0.9 UNIT/ML-% IJ SOLN
INTRAMUSCULAR | Status: DC | PRN
  Administered 2016-11-28: 1000 mL

## 2016-11-28 MED ORDER — LIDOCAINE HCL (PF) 1 % IJ SOLN
INTRAMUSCULAR | Status: DC | PRN
  Administered 2016-11-28: 2 mL

## 2016-11-28 MED ORDER — SODIUM CHLORIDE 0.9% FLUSH: 3.0000 mL | INTRAVENOUS | Status: DC | PRN

## 2016-11-28 MED ORDER — METOPROLOL TARTRATE 5 MG/5ML IV SOLN
INTRAVENOUS | Status: AC
  Filled 2016-11-28: qty 5

## 2016-11-28 MED ORDER — SODIUM CHLORIDE 0.9 % IV SOLN
INTRAVENOUS | Status: AC
  Administered 2016-11-28: via INTRAVENOUS

## 2016-11-28 MED ORDER — HEPARIN SODIUM (PORCINE) 1000 UNIT/ML IJ SOLN
INTRAMUSCULAR | Status: DC | PRN
  Administered 2016-11-28: 4000 [IU] via INTRAVENOUS

## 2016-11-28 MED ORDER — SODIUM CHLORIDE 0.9 % IV SOLN
INTRAVENOUS | Status: DC | PRN
  Administered 2016-11-28: 10:00:00
  Administered 2016-11-28: 250 mL via INTRAVENOUS

## 2016-11-28 MED ORDER — NITROGLYCERIN 1 MG/10 ML FOR IR/CATH LAB
INTRA_ARTERIAL | Status: AC
  Filled 2016-11-28: qty 10

## 2016-11-28 MED ORDER — ACETAMINOPHEN 325 MG PO TABS: 650.0000 mg | ORAL_TABLET | ORAL | Status: DC | PRN

## 2016-11-28 MED ORDER — HEPARIN (PORCINE) IN NACL 100-0.45 UNIT/ML-% IJ SOLN
1250.0000 [IU]/h | INTRAMUSCULAR | Status: DC
  Administered 2016-11-28: 1150 [IU]/h via INTRAVENOUS
  Administered 2016-11-29 – 2016-12-04 (×7): 1250 [IU]/h via INTRAVENOUS
  Filled 2016-11-28 (×7): qty 250

## 2016-11-28 MED ORDER — SODIUM CHLORIDE 0.9 % IV SOLN: 250.0000 mL | INTRAVENOUS | Status: DC | PRN

## 2016-11-28 MED ORDER — FENTANYL CITRATE (PF) 100 MCG/2ML IJ SOLN
INTRAMUSCULAR | Status: DC | PRN
  Administered 2016-11-28: 50 ug via INTRAVENOUS

## 2016-11-28 MED ORDER — SODIUM CHLORIDE 0.9% FLUSH
3.0000 mL | Freq: Two times a day (BID) | INTRAVENOUS | Status: DC
  Administered 2016-11-28 – 2016-11-30 (×3): 3 mL via INTRAVENOUS

## 2016-11-28 MED ORDER — NITROGLYCERIN 1 MG/10 ML FOR IR/CATH LAB
INTRA_ARTERIAL | Status: DC | PRN
  Administered 2016-11-28: 200 ug via INTRA_ARTERIAL

## 2016-11-28 MED ORDER — HEPARIN SODIUM (PORCINE) 5000 UNIT/ML IJ SOLN: 5000.0000 [IU] | Freq: Three times a day (TID) | INTRAMUSCULAR | Status: DC

## 2016-11-28 MED ORDER — LIDOCAINE HCL 1 % IJ SOLN
INTRAMUSCULAR | Status: AC
  Filled 2016-11-28: qty 20

## 2016-11-28 MED ORDER — MIDAZOLAM HCL 2 MG/2ML IJ SOLN
INTRAMUSCULAR | Status: DC | PRN
  Administered 2016-11-28: 1 mg via INTRAVENOUS

## 2016-11-28 MED ORDER — ONDANSETRON HCL 4 MG/2ML IJ SOLN
4.0000 mg | Freq: Four times a day (QID) | INTRAMUSCULAR | Status: DC | PRN
  Administered 2016-11-28 – 2016-11-29 (×2): 4 mg via INTRAVENOUS
  Filled 2016-11-28 (×4): qty 2

## 2016-11-28 MED ORDER — VERAPAMIL HCL 2.5 MG/ML IV SOLN
INTRAVENOUS | Status: DC | PRN
  Administered 2016-11-28: 09:00:00 via INTRA_ARTERIAL

## 2016-11-28 MED ORDER — METOPROLOL TARTRATE 5 MG/5ML IV SOLN
2.5000 mg | INTRAVENOUS | Status: AC
  Administered 2016-11-28: 2.5 mg via INTRAVENOUS

## 2016-11-28 MED ORDER — OXYCODONE-ACETAMINOPHEN 5-325 MG PO TABS: 1.0000 | ORAL_TABLET | ORAL | Status: DC | PRN

## 2016-11-28 MED ORDER — HEPARIN (PORCINE) IN NACL 2-0.9 UNIT/ML-% IJ SOLN
INTRAMUSCULAR | Status: AC
  Filled 2016-11-28: qty 1000

## 2016-11-28 SURGICAL SUPPLY — 13 items
CATH INFINITI 5 FR JL3.5 (CATHETERS) ×1 IMPLANT
CATH INFINITI JR4 5F (CATHETERS) ×1 IMPLANT
COVER PRB 48X5XTLSCP FOLD TPE (BAG) IMPLANT
COVER PROBE 5X48 (BAG) ×2
DEVICE RAD COMP TR BAND LRG (VASCULAR PRODUCTS) ×1 IMPLANT
ELECT DEFIB PAD ADLT CADENCE (PAD) ×2 IMPLANT
GLIDESHEATH SLEND A-KIT 6F 22G (SHEATH) ×1 IMPLANT
GUIDEWIRE INQWIRE 1.5J.035X260 (WIRE) ×1 IMPLANT
INQWIRE 1.5J .035X260CM (WIRE) ×2
KIT HEART LEFT (KITS) ×2 IMPLANT
PACK CARDIAC CATHETERIZATION (CUSTOM PROCEDURE TRAY) ×2 IMPLANT
TRANSDUCER W/STOPCOCK (MISCELLANEOUS) ×2 IMPLANT
TUBING CIL FLEX 10 FLL-RA (TUBING) ×2 IMPLANT

## 2016-11-28 NOTE — Interval H&P Note (Signed)
Cath Lab Visit (complete for each Cath Lab visit)  Clinical Evaluation Leading to the Procedure:   ACS: Yes.    Non-ACS:    Anginal Classification: CCS IV  Anti-ischemic medical therapy: Maximal Therapy (2 or more classes of medications)  Non-Invasive Test Results: No non-invasive testing performed  Prior CABG: No previous CABG      History and Physical Interval Note:  11/28/2016 8:28 AM  [name]  has presented today for surgery, with the diagnosis of v fib  The various methods of treatment have been discussed with the patient and family. After consideration of risks, benefits and other options for treatment, the patient has consented to  Procedure(s): Left Heart Cath and Coronary Angiography (N/A) as a surgical intervention .  The patient's history has been reviewed, patient examined, no change in status, stable for surgery.  I have reviewed the patient's chart and labs.  Questions were answered to the patient's satisfaction.     [name]

## 2016-11-28 NOTE — Progress Notes (Signed)
Electrophysiology Rounding Note  Patient Name: [name] Date of Encounter: 11/28/2016  Primary Cardiologist: Athalia Electrophysiologist: Lovena Le   Subjective   The patient is doing well today.  At this time, the patient denies chest pain, shortness of breath, or any new concerns. She had sustained VT that was relatively asymptomatic overnight.   Inpatient Medications    Scheduled Meds: . aspirin  81 mg Oral Pre-Cath  . furosemide  40 mg Intravenous BID  . insulin aspart  0-15 Units Subcutaneous TID WC  . metoprolol      . mexiletine  300 mg Oral Q8H  . senna-docusate  1 tablet Oral BID  . sodium chloride flush  3 mL Intravenous Q12H  . sodium chloride flush  3 mL Intravenous Q12H   Continuous Infusions: . sodium chloride    . sodium chloride    . sodium chloride    . heparin 1,150 Units/hr (11/28/16 0500)   PRN Meds: sodium chloride, sodium chloride, acetaminophen, ALPRAZolam, ondansetron (ZOFRAN) IV, sodium chloride flush, sodium chloride flush   Vital Signs    Vitals:   11/27/16 1708 11/27/16 1954 11/28/16 0016 11/28/16 0600  BP:  (!) 121/59  124/73  Pulse: 71 76 80   Resp:  18 18 18   Temp: 97.9 F (36.6 C) 98 F (36.7 C) 98.2 F (36.8 C) 98.8 F (37.1 C)  TempSrc: Oral Oral Oral Oral  SpO2: 98% 90% 93%   Weight:    175 lb (79.4 kg)  Height:        Intake/Output Summary (Last 24 hours) at 11/28/16 0715 Last data filed at 11/28/16 0300  Gross per 24 hour  Intake            89.17 ml  Output              300 ml  Net          -210.83 ml   Filed Weights   11/27/16 0400 11/27/16 1335 11/28/16 0600  Weight: 173 lb 8 oz (78.7 kg) 176 lb 2.4 oz (79.9 kg) 175 lb (79.4 kg)    Physical Exam    GEN- The patient is obese and chronically ill appearing, alert and oriented x 3 today.   Head- normocephalic, atraumatic Eyes-  Sclera clear, conjunctiva pink Ears- hearing intact Oropharynx- clear Neck- supple Lungs- Clear to ausculation bilaterally,  normal work of breathing Heart- Regular rate and rhythm, no murmurs, rubs or gallops GI- soft, NT, ND, + BS Extremities- no clubbing, cyanosis, or edema Skin- no rash or lesion Psych- euthymic mood, full affect Neuro- strength and sensation are intact  Labs    CBC  Recent Labs  11/27/16 0159  WBC 13.3*  HGB 9.7*  HCT 31.1*  MCV 87.1  PLT 676   Basic Metabolic Panel  Recent Labs  11/27/16 0159 11/27/16 1404 11/28/16 0542  NA 137  --  138  K 3.9  --  4.0  CL 106  --  107  CO2 21*  --  22  GLUCOSE 169*  --  159*  BUN 37*  --  36*  CREATININE 1.17*  --  1.19*  CALCIUM 8.9  --  8.6*  MG  --  2.0  --    Liver Function Tests  Recent Labs  11/27/16 0159  AST 16  ALT 14  ALKPHOS 96  BILITOT 0.9  PROT 6.9  ALBUMIN 3.5   No results for input(s): LIPASE, AMYLASE in the last 72 hours. Cardiac Enzymes  Recent Labs  11/27/16 1404 11/27/16 1924 11/28/16 0108  TROPONINI 1.92* 1.42* 1.29*   Fasting Lipid Panel  Recent Labs  11/28/16 0542  CHOL 139  HDL 33*  LDLCALC 88  TRIG 88  CHOLHDL 4.2     Telemetry    Sinus rhythm with sustained ventricular tachycardia at 120bpm overnight  (personally reviewed)  Radiology    Dg Chest 2 View  Result Date: 11/27/2016 CLINICAL DATA:  71 year old female with shortness of breath. History of CHF an AP. EXAM: CHEST  2 VIEW COMPARISON:  Chest radiograph dated 05/19/2016 FINDINGS: There is stable cardiomegaly. Streaky perihilar and central densities as well as diffuse interstitial prominence consistent with vascular congestion and edema. Pneumonia is not excluded. Small left pleural effusion with subsegmental atelectatic changes of the left lung base. Superimposed pneumonia is not excluded. Clinical correlation is recommended. Left pectoral dual lead AICD device. No acute osseous pathology. IMPRESSION: Cardiomegaly with findings of CHF and small left pleural effusion. Left lung base subsegmental atelectasis versus  infiltrate. Electronically Signed   By: Anner Crete M.D.   On: 11/27/2016 02:43   Ct Head Wo Contrast  Result Date: 11/27/2016 CLINICAL DATA:  71 year old female with possible head trauma. EXAM: CT HEAD WITHOUT CONTRAST CT CERVICAL SPINE WITHOUT CONTRAST TECHNIQUE: Multidetector CT imaging of the head and cervical spine was performed following the standard protocol without intravenous contrast. Multiplanar CT image reconstructions of the cervical spine were also generated. COMPARISON:  Head CT dated 05/11/2015 FINDINGS: CT HEAD FINDINGS Brain: The ventricles and sulci appropriate size for patient's age. Mild periventricular and deep white matter chronic microvascular ischemic changes noted. There is no acute intracranial hemorrhage. No mass effect or midline shift noted. No extra-axial fluid collections. Vascular: No hyperdense vessel or unexpected calcification. Skull: Normal. Negative for fracture or focal lesion. Sinuses/Orbits: There is a 2.1 x 1.8 cm calcified mass in the right maxillary sinus which appears to arise from the right maxillary bone. This lesion is incompletely visualized but appears new compared to the CT of 05/11/2015. This may represent an osteoma, ossifying fibroma or an odontogenic lesion. If the patient has symptoms of sinusitis or right-sided dental pain a nonemergent facial CT could be considered. No definite aggressive features noted, however disease lesion is partially visualized. The visualized paranasal sinuses and mastoid air cells are clear. No air-fluid levels. Other: Small left parietal scalp contusion. CT CERVICAL SPINE FINDINGS Alignment: No acute subluxation. Skull base and vertebrae: No acute fracture. No primary bone lesion or focal pathologic process. Soft tissues and spinal canal: No prevertebral fluid or swelling. No visible canal hematoma. Disc levels: There is degenerative changes with endplate irregularity and disc space narrowing and C6-C7. There is associated  osteophyte at this level. Multilevel facet hypertrophy noted. Upper chest: Stop partially visualized bilateral pleural effusions. Left pectoral pacemaker device partially visualized. Probable calcified thyroid nodules. Ultrasound may provide better evaluation of the thyroid gland. Other: None IMPRESSION: 1. No acute intracranial hemorrhage. Mild chronic microvascular ischemic changes. 2. No acute/traumatic cervical spine pathology. 3. Rounded calcified mass in the right maxillary sinus, new compared to the prior CT. This may represent an osteoma, an ossifying fibroma or an odontogenic lesion. Correlation with clinical exam recommended. CT of the maxillofacial bones on a non emergent basis may provide better evaluation. 4. Partially visualized bilateral pleural effusions. Electronically Signed   By: Anner Crete M.D.   On: 11/27/2016 03:00   Ct Cervical Spine Wo Contrast  Result Date: 11/27/2016 CLINICAL DATA:  71 year old female  with possible head trauma. EXAM: CT HEAD WITHOUT CONTRAST CT CERVICAL SPINE WITHOUT CONTRAST TECHNIQUE: Multidetector CT imaging of the head and cervical spine was performed following the standard protocol without intravenous contrast. Multiplanar CT image reconstructions of the cervical spine were also generated. COMPARISON:  Head CT dated 05/11/2015 FINDINGS: CT HEAD FINDINGS Brain: The ventricles and sulci appropriate size for patient's age. Mild periventricular and deep white matter chronic microvascular ischemic changes noted. There is no acute intracranial hemorrhage. No mass effect or midline shift noted. No extra-axial fluid collections. Vascular: No hyperdense vessel or unexpected calcification. Skull: Normal. Negative for fracture or focal lesion. Sinuses/Orbits: There is a 2.1 x 1.8 cm calcified mass in the right maxillary sinus which appears to arise from the right maxillary bone. This lesion is incompletely visualized but appears new compared to the CT of 05/11/2015.  This may represent an osteoma, ossifying fibroma or an odontogenic lesion. If the patient has symptoms of sinusitis or right-sided dental pain a nonemergent facial CT could be considered. No definite aggressive features noted, however disease lesion is partially visualized. The visualized paranasal sinuses and mastoid air cells are clear. No air-fluid levels. Other: Small left parietal scalp contusion. CT CERVICAL SPINE FINDINGS Alignment: No acute subluxation. Skull base and vertebrae: No acute fracture. No primary bone lesion or focal pathologic process. Soft tissues and spinal canal: No prevertebral fluid or swelling. No visible canal hematoma. Disc levels: There is degenerative changes with endplate irregularity and disc space narrowing and C6-C7. There is associated osteophyte at this level. Multilevel facet hypertrophy noted. Upper chest: Stop partially visualized bilateral pleural effusions. Left pectoral pacemaker device partially visualized. Probable calcified thyroid nodules. Ultrasound may provide better evaluation of the thyroid gland. Other: None IMPRESSION: 1. No acute intracranial hemorrhage. Mild chronic microvascular ischemic changes. 2. No acute/traumatic cervical spine pathology. 3. Rounded calcified mass in the right maxillary sinus, new compared to the prior CT. This may represent an osteoma, an ossifying fibroma or an odontogenic lesion. Correlation with clinical exam recommended. CT of the maxillofacial bones on a non emergent basis may provide better evaluation. 4. Partially visualized bilateral pleural effusions. Electronically Signed   By: Anner Crete M.D.   On: 11/27/2016 03:00     Patient Profile     KEASHA MALKIEWICZ is a 71 y.o. female with a past medical history significant for CAD, paroxysmal atrial fibrillation, diabetes, and ventricular tachycardia.  She was admitted for ICD shocks and syncope.   Assessment & Plan    1.  Ventricular tachycardia S/p appropriate ICD  therapy Interestingly, her episodes are initiated by a PAC Continue Mexiletine. She had recurrent sustained RBBB VT last night at a rate of 120bpm Agree with LHC today Keep K >3.9, Mg >1.8 No driving x6 months  2.  CAD Plan for LHC today  3.  Paroxysmal atrial fibrillation Maintaining SR Continue Xarelto long term for CHADS2VASC of 5 Currently on Heparin with pending cath If needs CABG, consider MAZE +/- LAA clipping   4.  HCM If needs CABG, consider myomectomy   Dr Lovena Le to see later today  Signed, Chanetta Marshall, NP  11/28/2016, 7:15 AM   EP Attending  Patient seen and examined. She is well known to me. She had VF with ICD shock yesterday and sustained relatively asymptomatic monomorphic VT last night as documented above. She will proceed with left heart cath today. She may need CABG. Her exam reveals a stable appearing 71 yo woman, NAD, with clear lungs and a  RRR and no edema. Neuro is non-focal. Tele as above.   A/P 1. VT/VF - she cannot take amio. She has been started on Mexitil. Will reduce the dose.  2. CAD - she is pending left heart cath. I think she would be a CABG candidate if she has surgical disease. 3. PAF - she appears to be maintaining NSR.  Mikle Bosworth.D.

## 2016-11-28 NOTE — Consult Note (Signed)
Reason for Consult:3 vessel CAD Referring Physician: Dr. Clearence Cheek  [name] is an 71 y.o. female.  HPI: 71 yo woman with an extensive cardiac history presents with syncope due to repeated episodes of VT/ VF.  71 yo woman with a past history of CAD, multiple MI, hypertrophic cardiomyopathy, moderate MR, moderate to severe TR, VT, ICD, paroxysmal atrial fibrillation and chronic diastolic heart failure. She also has a history of diabetes and orthostatic hypotension. I saw her in 2016 after she presented in heart failure. I recommended initial medical therapy with interval CABG in 2-3 months but she decided she did not want to have surgery.  She was in her usual state of health until Tuesday of this week when she had several dizzy spells. On Wednesday night she had a syncopal spell while using the bathroom and fell hitting her head. She came tot he ED. Interrogation of her ICD showed 6 episodes of VF. Her ECG showed inferior ST depression. She ruled in for MI with a peak troponin of 1.92. Her BNP was 920.  She had cardiac cath yesterday which revealed severe 3 vessel CAD. Unable to do LV gram due to VT when pigtail advanced into LV.  She denies any CP prior to admission but was feeling tired and noticed increased abdominal fullness. Some peripheral edema- she manages with lasix.  Past Medical History:  Diagnosis Date  . Abnormal PFT   . CAD (coronary artery disease), native coronary artery - 3 vessel 04/21/2015   a. NSTEMI 8-04/2015 felt 2/2 demand ischemia. b. 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  . Chronic diastolic CHF (congestive heart failure) ([place])   . Diabetes mellitus ([place])   . Diabetes mellitus without complication ([place])    dx on wed  . Hypertrophic cardiomyopathy ([place])   . Left atrial enlargement   . Mitral regurgitation    a. Echo 04/2015: moderate mitral regurgitation. b. F/u echo 05/2015: mild MR.  . NSTEMI (non-ST  elevated myocardial infarction) ([place]) 11/27/2016  . Paroxysmal atrial fibrillation ([name])   . Shortness of breath dyspnea   . Tricuspid regurgitation    a. Echo 04/2015: Mod-severe TR. b. Not mentioned on echo 05/2015  . Ventricular tachycardia ([place])    a. s/p [employer] Jude ICD implanted 04/2015.    Past Surgical History:  Procedure Laterality Date  . APPLICATION OF WOUND VAC Left 01/06/2013   Procedure: APPLICATION OF WOUND VAC X 2;  Surgeon: [name], MD;  Location: [facility];  Service: Orthopedics;  Laterality: Left;  left forearm  . APPLICATION OF WOUND VAC Left 01/09/2013   Procedure: APPLICATION OF WOUND VAC;  Surgeon: [name], MD;  Location: [facility];  Service: Orthopedics;  Laterality: Left;  . CARDIAC CATHETERIZATION N/A 04/19/2015   Procedure: Left Heart Cath and Coronary Angiography;  Surgeon: [name], MD;  Location: [place] [facility];  Service: Cardiovascular;  Laterality: N/A;  . CARDIOVERSION N/A 09/20/2015   Procedure: CARDIOVERSION;  Surgeon: Pixie Casino, MD;  Location: [facility];  Service: Cardiovascular;  Laterality: N/A;  . DENTAL SURGERY  Oct. 2011   several extractions, bone graft  . EP IMPLANTABLE DEVICE N/A 04/16/2015   Procedure: ICD Implant;  Surgeon: [name], MD;  Location: [place] [facility];  Service: Cardiovascular;  Laterality: N/A;  . I&D EXTREMITY Left 01/06/2013   Procedure: IRRIGATION AND DEBRIDEMENT LEFT ELBOW AND LEFT FOREARM ;  Surgeon: [name],  MD;  Location: [facility];  Service: Orthopedics;  Laterality: Left;  . INCISION AND DRAINAGE Left 01/09/2013   Procedure: REDO INCISION AND DRAINAGE LEFT ELBOW;  Surgeon: [name], MD;  Location: [facility];  Service: Orthopedics;  Laterality: Left;  SUPINE, UPPER EXTERMITY DRAPE  . LEFT HEART CATH AND CORONARY ANGIOGRAPHY N/A 11/28/2016   Procedure: Left Heart Cath and Coronary Angiography;  Surgeon: Belva Crome, MD;  Location: [place] [facility];  Service: Cardiovascular;   Laterality: N/A;  . SECONDARY CLOSURE OF WOUND Left 01/09/2013   Procedure: SECONDARY CLOSURE OF WOUND  LEFT ELBOW;  Surgeon: [name], MD;  Location: [facility];  Service: Orthopedics;  Laterality: Left;  . TEE WITHOUT CARDIOVERSION N/A 09/20/2015   Procedure: TRANSESOPHAGEAL ECHOCARDIOGRAM (TEE);  Surgeon: Pixie Casino, MD;  Location: [facility] ENDOSCOPY;  Service: Cardiovascular;  Laterality: N/A;    Family History  Problem Relation Age of Onset  . Breast cancer Mother   . Diabetes Father   . Stroke Father   . Heart disease Sister   . Healthy Sister     Social History:  reports that she quit smoking about 35 years ago. She quit after 4.00 years of use. She has never used smokeless tobacco. She reports that she does not drink alcohol or use drugs.  Allergies: No Known Allergies  Medications:  Scheduled: . furosemide  40 mg Intravenous BID  . insulin aspart  0-15 Units Subcutaneous TID WC  . metoprolol      . mexiletine  300 mg Oral Q8H  . senna-docusate  1 tablet Oral BID  . sodium chloride flush  3 mL Intravenous Q12H    Results for orders placed or performed during the hospital encounter of 11/27/16 (from the past 48 hour(s))  CBC     Status: Abnormal   Collection Time: 11/27/16  1:59 AM  Result Value Ref Range   WBC 13.3 (H) 4.0 - 10.5 K/uL   RBC 3.57 (L) 3.87 - 5.11 MIL/uL   Hemoglobin 9.7 (L) 12.0 - 15.0 g/dL   HCT 31.1 (L) 36.0 - 46.0 %   MCV 87.1 78.0 - 100.0 fL   MCH 27.2 26.0 - 34.0 pg   MCHC 31.2 30.0 - 36.0 g/dL   RDW 14.9 11.5 - 15.5 %   Platelets 347 150 - 400 K/uL  Comprehensive metabolic panel     Status: Abnormal   Collection Time: 11/27/16  1:59 AM  Result Value Ref Range   Sodium 137 135 - 145 mmol/L   Potassium 3.9 3.5 - 5.1 mmol/L   Chloride 106 101 - 111 mmol/L   CO2 21 (L) 22 - 32 mmol/L   Glucose, Bld 169 (H) 65 - 99 mg/dL   BUN 37 (H) 6 - 20 mg/dL   Creatinine, Ser 1.17 (H) 0.44 - 1.00 mg/dL   Calcium 8.9 8.9 - 10.3 mg/dL   Total Protein 6.9  6.5 - 8.1 g/dL   Albumin 3.5 3.5 - 5.0 g/dL   AST 16 15 - 41 U/L   ALT 14 14 - 54 U/L   Alkaline Phosphatase 96 38 - 126 U/L   Total Bilirubin 0.9 0.3 - 1.2 mg/dL   GFR calc non Af Amer 47 (L) >60 mL/min   GFR calc Af Amer 54 (L) >60 mL/min    Comment: (NOTE) The eGFR has been calculated using the CKD EPI equation. This calculation has not been validated in all clinical situations. eGFR's persistently <60 mL/min signify possible Chronic  Kidney Disease.    Anion gap 10 5 - 15  Troponin I     Status: Abnormal   Collection Time: 11/27/16  1:59 AM  Result Value Ref Range   Troponin I 1.56 (HH) <0.03 ng/mL    Comment: CRITICAL RESULT CALLED TO, READ BACK BY AND VERIFIED WITH: PRUETT K,RN 11/27/16 0253 WAYK   Brain natriuretic peptide     Status: Abnormal   Collection Time: 11/27/16  1:59 AM  Result Value Ref Range   B Natriuretic Peptide 920.0 (H) 0.0 - 100.0 pg/mL  Heparin level (unfractionated)     Status: Abnormal   Collection Time: 11/27/16  5:38 AM  Result Value Ref Range   Heparin Unfractionated >2.20 (H) 0.30 - 0.70 IU/mL    Comment: RESULTS CONFIRMED BY MANUAL DILUTION REPEATED TO VERIFY        IF HEPARIN RESULTS ARE BELOW EXPECTED VALUES, AND PATIENT DOSAGE HAS BEEN CONFIRMED, SUGGEST FOLLOW UP TESTING OF ANTITHROMBIN III LEVELS.   APTT     Status: None   Collection Time: 11/27/16  5:38 AM  Result Value Ref Range   aPTT 34 24 - 36 seconds  Urinalysis, Routine w reflex microscopic     Status: Abnormal   Collection Time: 11/27/16  8:50 AM  Result Value Ref Range   Color, Urine YELLOW YELLOW   APPearance HAZY (A) CLEAR   Specific Gravity, Urine 1.015 1.005 - 1.030   pH 5.0 5.0 - 8.0   Glucose, UA NEGATIVE NEGATIVE mg/dL   Hgb urine dipstick SMALL (A) NEGATIVE   Bilirubin Urine NEGATIVE NEGATIVE   Ketones, ur NEGATIVE NEGATIVE mg/dL   Protein, ur 30 (A) NEGATIVE mg/dL   Nitrite NEGATIVE NEGATIVE   Leukocytes, UA NEGATIVE NEGATIVE   RBC / HPF 6-30 0 - 5 RBC/hpf    WBC, UA 0-5 0 - 5 WBC/hpf   Bacteria, UA MANY (A) NONE SEEN   Squamous Epithelial / LPF 0-5 (A) NONE SEEN   Hyaline Casts, UA PRESENT   Troponin I     Status: Abnormal   Collection Time: 11/27/16  2:04 PM  Result Value Ref Range   Troponin I 1.92 (HH) <0.03 ng/mL    Comment: CRITICAL VALUE NOTED.  VALUE IS CONSISTENT WITH PREVIOUSLY REPORTED AND CALLED VALUE.  Magnesium     Status: None   Collection Time: 11/27/16  2:04 PM  Result Value Ref Range   Magnesium 2.0 1.7 - 2.4 mg/dL  Glucose, capillary     Status: Abnormal   Collection Time: 11/27/16  4:36 PM  Result Value Ref Range   Glucose-Capillary 141 (H) 65 - 99 mg/dL  Troponin I     Status: Abnormal   Collection Time: 11/27/16  7:24 PM  Result Value Ref Range   Troponin I 1.42 (HH) <0.03 ng/mL    Comment: CRITICAL VALUE NOTED.  VALUE IS CONSISTENT WITH PREVIOUSLY REPORTED AND CALLED VALUE.  Glucose, capillary     Status: Abnormal   Collection Time: 11/27/16 11:13 PM  Result Value Ref Range   Glucose-Capillary 249 (H) 65 - 99 mg/dL  Troponin I     Status: Abnormal   Collection Time: 11/28/16  1:08 AM  Result Value Ref Range   Troponin I 1.29 (HH) <0.03 ng/mL    Comment: CRITICAL VALUE NOTED.  VALUE IS CONSISTENT WITH PREVIOUSLY REPORTED AND CALLED VALUE.  APTT     Status: Abnormal   Collection Time: 11/28/16  1:08 AM  Result Value Ref Range   aPTT 47 (H)  24 - 36 seconds    Comment:        IF BASELINE aPTT IS ELEVATED, SUGGEST PATIENT RISK ASSESSMENT BE USED TO DETERMINE APPROPRIATE ANTICOAGULANT THERAPY.   Basic metabolic panel     Status: Abnormal   Collection Time: 11/28/16  5:42 AM  Result Value Ref Range   Sodium 138 135 - 145 mmol/L   Potassium 4.0 3.5 - 5.1 mmol/L   Chloride 107 101 - 111 mmol/L   CO2 22 22 - 32 mmol/L   Glucose, Bld 159 (H) 65 - 99 mg/dL   BUN 36 (H) 6 - 20 mg/dL   Creatinine, Ser 1.19 (H) 0.44 - 1.00 mg/dL   Calcium 8.6 (L) 8.9 - 10.3 mg/dL   GFR calc non Af Amer 46 (L) >60 mL/min    GFR calc Af Amer 53 (L) >60 mL/min    Comment: (NOTE) The eGFR has been calculated using the CKD EPI equation. This calculation has not been validated in all clinical situations. eGFR's persistently <60 mL/min signify possible Chronic Kidney Disease.    Anion gap 9 5 - 15  Lipid panel     Status: Abnormal   Collection Time: 11/28/16  5:42 AM  Result Value Ref Range   Cholesterol 139 0 - 200 mg/dL   Triglycerides 88 <150 mg/dL   HDL 33 (L) >40 mg/dL   Total CHOL/HDL Ratio 4.2 RATIO   VLDL 18 0 - 40 mg/dL   LDL Cholesterol 88 0 - 99 mg/dL    Comment:        Total Cholesterol/HDL:CHD Risk Coronary Heart Disease Risk Table                     Men   Women  1/2 Average Risk   3.4   3.3  Average Risk       5.0   4.4  2 X Average Risk   9.6   7.1  3 X Average Risk  23.4   11.0        Use the calculated Patient Ratio above and the CHD Risk Table to determine the patient's CHD Risk.        ATP III CLASSIFICATION (LDL):  <100     mg/dL   Optimal  100-129  mg/dL   Near or Above                    Optimal  130-159  mg/dL   Borderline  160-189  mg/dL   High  >190     mg/dL   Very High   CBC     Status: Abnormal   Collection Time: 11/28/16  7:41 AM  Result Value Ref Range   WBC 7.9 4.0 - 10.5 K/uL   RBC 3.37 (L) 3.87 - 5.11 MIL/uL   Hemoglobin 9.2 (L) 12.0 - 15.0 g/dL   HCT 29.4 (L) 36.0 - 46.0 %   MCV 87.2 78.0 - 100.0 fL   MCH 27.3 26.0 - 34.0 pg   MCHC 31.3 30.0 - 36.0 g/dL   RDW 15.3 11.5 - 15.5 %   Platelets 319 150 - 400 K/uL  Glucose, capillary     Status: Abnormal   Collection Time: 11/28/16  8:10 AM  Result Value Ref Range   Glucose-Capillary 143 (H) 65 - 99 mg/dL  Glucose, capillary     Status: Abnormal   Collection Time: 11/28/16 11:24 AM  Result Value Ref Range   Glucose-Capillary 115 (H) 65 - 99  mg/dL    Dg Chest 2 View  Result Date: 11/27/2016 CLINICAL DATA:  71 year old female with shortness of breath. History of CHF an AP. EXAM: CHEST  2 VIEW COMPARISON:   Chest radiograph dated 05/19/2016 FINDINGS: There is stable cardiomegaly. Streaky perihilar and central densities as well as diffuse interstitial prominence consistent with vascular congestion and edema. Pneumonia is not excluded. Small left pleural effusion with subsegmental atelectatic changes of the left lung base. Superimposed pneumonia is not excluded. Clinical correlation is recommended. Left pectoral dual lead AICD device. No acute osseous pathology. IMPRESSION: Cardiomegaly with findings of CHF and small left pleural effusion. Left lung base subsegmental atelectasis versus infiltrate. Electronically Signed   By: Anner Crete M.D.   On: 11/27/2016 02:43   Ct Head Wo Contrast  Result Date: 11/27/2016 CLINICAL DATA:  71 year old female with possible head trauma. EXAM: CT HEAD WITHOUT CONTRAST CT CERVICAL SPINE WITHOUT CONTRAST TECHNIQUE: Multidetector CT imaging of the head and cervical spine was performed following the standard protocol without intravenous contrast. Multiplanar CT image reconstructions of the cervical spine were also generated. COMPARISON:  Head CT dated 05/11/2015 FINDINGS: CT HEAD FINDINGS Brain: The ventricles and sulci appropriate size for patient's age. Mild periventricular and deep white matter chronic microvascular ischemic changes noted. There is no acute intracranial hemorrhage. No mass effect or midline shift noted. No extra-axial fluid collections. Vascular: No hyperdense vessel or unexpected calcification. Skull: Normal. Negative for fracture or focal lesion. Sinuses/Orbits: There is a 2.1 x 1.8 cm calcified mass in the right maxillary sinus which appears to arise from the right maxillary bone. This lesion is incompletely visualized but appears new compared to the CT of 05/11/2015. This may represent an osteoma, ossifying fibroma or an odontogenic lesion. If the patient has symptoms of sinusitis or right-sided dental pain a nonemergent facial CT could be considered. No  definite aggressive features noted, however disease lesion is partially visualized. The visualized paranasal sinuses and mastoid air cells are clear. No air-fluid levels. Other: Small left parietal scalp contusion. CT CERVICAL SPINE FINDINGS Alignment: No acute subluxation. Skull base and vertebrae: No acute fracture. No primary bone lesion or focal pathologic process. Soft tissues and spinal canal: No prevertebral fluid or swelling. No visible canal hematoma. Disc levels: There is degenerative changes with endplate irregularity and disc space narrowing and C6-C7. There is associated osteophyte at this level. Multilevel facet hypertrophy noted. Upper chest: Stop partially visualized bilateral pleural effusions. Left pectoral pacemaker device partially visualized. Probable calcified thyroid nodules. Ultrasound may provide better evaluation of the thyroid gland. Other: None IMPRESSION: 1. No acute intracranial hemorrhage. Mild chronic microvascular ischemic changes. 2. No acute/traumatic cervical spine pathology. 3. Rounded calcified mass in the right maxillary sinus, new compared to the prior CT. This may represent an osteoma, an ossifying fibroma or an odontogenic lesion. Correlation with clinical exam recommended. CT of the maxillofacial bones on a non emergent basis may provide better evaluation. 4. Partially visualized bilateral pleural effusions. Electronically Signed   By: Anner Crete M.D.   On: 11/27/2016 03:00   Ct Cervical Spine Wo Contrast  Result Date: 11/27/2016 CLINICAL DATA:  71 year old female with possible head trauma. EXAM: CT HEAD WITHOUT CONTRAST CT CERVICAL SPINE WITHOUT CONTRAST TECHNIQUE: Multidetector CT imaging of the head and cervical spine was performed following the standard protocol without intravenous contrast. Multiplanar CT image reconstructions of the cervical spine were also generated. COMPARISON:  Head CT dated 05/11/2015 FINDINGS: CT HEAD FINDINGS Brain: The ventricles and  sulci appropriate  size for patient's age. Mild periventricular and deep white matter chronic microvascular ischemic changes noted. There is no acute intracranial hemorrhage. No mass effect or midline shift noted. No extra-axial fluid collections. Vascular: No hyperdense vessel or unexpected calcification. Skull: Normal. Negative for fracture or focal lesion. Sinuses/Orbits: There is a 2.1 x 1.8 cm calcified mass in the right maxillary sinus which appears to arise from the right maxillary bone. This lesion is incompletely visualized but appears new compared to the CT of 05/11/2015. This may represent an osteoma, ossifying fibroma or an odontogenic lesion. If the patient has symptoms of sinusitis or right-sided dental pain a nonemergent facial CT could be considered. No definite aggressive features noted, however disease lesion is partially visualized. The visualized paranasal sinuses and mastoid air cells are clear. No air-fluid levels. Other: Small left parietal scalp contusion. CT CERVICAL SPINE FINDINGS Alignment: No acute subluxation. Skull base and vertebrae: No acute fracture. No primary bone lesion or focal pathologic process. Soft tissues and spinal canal: No prevertebral fluid or swelling. No visible canal hematoma. Disc levels: There is degenerative changes with endplate irregularity and disc space narrowing and C6-C7. There is associated osteophyte at this level. Multilevel facet hypertrophy noted. Upper chest: Stop partially visualized bilateral pleural effusions. Left pectoral pacemaker device partially visualized. Probable calcified thyroid nodules. Ultrasound may provide better evaluation of the thyroid gland. Other: None IMPRESSION: 1. No acute intracranial hemorrhage. Mild chronic microvascular ischemic changes. 2. No acute/traumatic cervical spine pathology. 3. Rounded calcified mass in the right maxillary sinus, new compared to the prior CT. This may represent an osteoma, an ossifying fibroma or an  odontogenic lesion. Correlation with clinical exam recommended. CT of the maxillofacial bones on a non emergent basis may provide better evaluation. 4. Partially visualized bilateral pleural effusions. Electronically Signed   By: Anner Crete M.D.   On: 11/27/2016 03:00    Review of Systems  Constitutional: Positive for malaise/fatigue.  Respiratory: Positive for shortness of breath.   Cardiovascular: Positive for orthopnea and leg swelling. Negative for chest pain.  Gastrointestinal: Negative for blood in stool and heartburn.       Abdominal distention  Genitourinary: Negative for dysuria and urgency.  Musculoskeletal: Positive for joint pain (needs R TKR). Negative for myalgias.  Neurological: Positive for dizziness and loss of consciousness. Negative for speech change, focal weakness and seizures.  Endo/Heme/Allergies: Bruises/bleeds easily (on Xarelto).   Blood pressure (!) 122/93, pulse (!) 0, temperature 97.9 F (36.6 C), temperature source Oral, resp. rate (!) 0, height 5' 4" (1.626 m), weight 175 lb (79.4 kg), SpO2 92 %. Physical Exam  Vitals reviewed. Constitutional: She is oriented to person, place, and time. She appears well-developed and well-nourished. No distress.  HENT:  Head: Normocephalic and atraumatic.  Mouth/Throat: No oropharyngeal exudate.  Eyes: Conjunctivae and EOM are normal. No scleral icterus.  Neck: Neck supple. No thyromegaly present.  No carotid bruits  Cardiovascular: Normal rate, regular rhythm and normal heart sounds.   No murmur heard. Respiratory: Effort normal. No respiratory distress. She has no wheezes. She has no rales.  Mildly diminished at left base  GI: Soft. She exhibits distension (mild). There is no tenderness.  Musculoskeletal: She exhibits no edema or deformity.  Lymphadenopathy:    She has no cervical adenopathy.  Neurological: She is alert and oriented to person, place, and time. No cranial nerve deficit.  Reflex Scores:       Tricep reflexes are 2+ on the right side and 2+ on the left  side.      Bicep reflexes are 2+ on the right side and 2+ on the left side.      Brachioradialis reflexes are 2+ on the right side and 2+ on the left side.      Patellar reflexes are 2+ on the right side and 2+ on the left side.      Achilles reflexes are 2+ on the right side and 2+ on the left side. No focal motor deficit  Skin: Skin is warm and dry.   CARDIAC CATHETERIZATION Conclusion    Severe three-vessel native coronary disease with chronic total occlusion of the RCA supplied by left-to-right collaterals. Inferior wall branches are graftable.  Total occlusion of the mid circumflex with retrograde filling of 2 large obtuse marginal branches. Branches appear graftable.  Severe LAD and diagonal disease with subtotal occlusion in the mid vessel of the LAD. 90% stenosis in the proximal portion of large diagonal.  Elevated left ventricular end-diastolic pressure during tachycardia.  Sustained, hemodynamically stable ventricular tachycardia during the procedure that required pace termination via the AICD.  RECOMMENDATIONS:   Assessment of LV function by CT and/or echo with specific attention to the presence absence of an LV aneurysm involving the inferior wall.  TCTS notified of need for CABG and ? Aneurysm resection if present.    Assessment/Plan: 71 yo woman with an extensive and complex past medical/ cardiac history who presents with syncope due to VF rescued with ICD. At catheterization she has severe 3 vessel CAD. She has not been having angina, but is diabetic.  I saw her in consultation a couple of years ago. At that time she had moderate MR and mod- severe TR, but she had an echo in 2017 which showed both to be mild. She needs another echo to reassess LV function and valvular status. Dr. Tamala Julian also raised concern for a possible LV aneurysm, which is improtant to know so that it can be addressed at the time of  surgery if present.  She had poor PFTs in 2016, but I suspect some of that was due her being in heart failure with sizable pleural effusions at that time. Will repeat PFTs.  She has a history of atrial fib and is on Xarelto chronically. She may be a candidate for a maze or at the least a left atrial clip at the time of CABG, depending on what other issues need to be addressed at the time.  She has poor dentition and has not seen a dentist in some time. Will check an orhtopantogram and get a dental consult.   She would be a relatively high risk patient for surgery, but I do not think it is prohibitive. I discussed the general nature of the procedure, the need for general anesthesia, the use of cardiopulmonary bypass, and the incisions to be used with [name] and her family. We discussed the expected hospital stay, overall recovery and short and long term outcomes. I informed them of the indications, risks, benefits and alternatives (medical therapy). They understand the risks include, but are not limited to death, stroke, MI, DVT/PE, bleeding, possible need for transfusion, infections, cardiac arrhythmias, as well as other organ system dysfunction including respiratory, renal, or GI complications.   Will tentatively plan for CABG, left atrial clip, +/- other procedures Thursday 5/3  Melrose Nakayama 11/28/2016, 2:10 PM

## 2016-11-28 NOTE — Progress Notes (Signed)
ANTICOAGULATION CONSULT NOTE - Follow Up Consult  Pharmacy Consult for Heparin (Xarelto on hold) Indication: chest pain/ACS and atrial fibrillation, pending CABG eval  No Known Allergies  Patient Measurements: Height: 5\' 4"  (162.6 cm) Weight: 175 lb (79.4 kg) IBW/kg (Calculated) : 54.7  Vital Signs: Temp: 97.9 F (36.6 C) (04/27 0813) Temp Source: Oral (04/27 0813) BP: 122/93 (04/27 1007) Pulse Rate: 0 (04/27 1007)  Labs:  Recent Labs  11/27/16 0159 11/27/16 0538 11/27/16 1404 11/27/16 1924 11/28/16 0108 11/28/16 0542 11/28/16 0741  HGB 9.7*  --   --   --   --   --  9.2*  HCT 31.1*  --   --   --   --   --  29.4*  PLT 347  --   --   --   --   --  319  APTT  --  34  --   --  47*  --   --   HEPARINUNFRC  --  >2.20*  --   --   --   --   --   CREATININE 1.17*  --   --   --   --  1.19*  --   TROPONINI 1.56*  --  1.92* 1.42* 1.29*  --   --     Estimated Creatinine Clearance: 46.1 mL/min (A) (by C-G formula based on SCr of 1.19 mg/dL (H)).   Assessment: 71yo female c/o sudden onset of dizziness followed by syncope/fall, believes she hit her head, no bleeding on head CT, troponin found to be elevated.  Anticoag: Xarelto for Afib, last dose 4/25 at 1800 Hep gtt now for CABG eval - aptt was originally low on 1000 (47) - increased to 1150 with no lvls  CV: s/p LHC - [place] out 1000 Pending TCTS consult for possible CABG Patient with VF/ICD shock 4/26  Renal: SCr 1.19   Heme/Onc: H&H 9.2/29.4, Plt 319  Goal of Therapy:  Heparin level 0.3-0.7 units/ml aPTT 66-102 seconds Monitor platelets by anticoagulation protocol: Yes   Plan:  Resume heparin no bolus 1150 units/hr at 1800 Next aptt/HL 0000 Daily HL, aptt, CBC F/U CABG eval  Levester Fresh, PharmD, BCPS, BCCCP Clinical Pharmacist Clinical phone for 11/28/2016 from 7a-3:30p: [phone] If after 3:30p, please call main pharmacy at: [phone] 11/28/2016 10:45 AM

## 2016-11-28 NOTE — Progress Notes (Addendum)
TR band removed,  Cath site level 0, 2x2 and opsite applied, no complications noted, pt denies pain or discomfort.  Edward Qualia RN

## 2016-11-28 NOTE — H&P (View-Only) (Signed)
ELECTROPHYSIOLOGY CONSULT NOTE    Patient ID: [name] MRN: [id number], DOB/AGE: 02-03-1946 71 y.o.  Admit date: 11/27/2016 Date of Consult: 11/27/2016   Primary Physician: Arnoldo Morale, MD Primary Cardiologist: Dr. Haroldine Laws Electrophysiologist: Dr. Lovena Le  Reason for Consultation: VF  HPI: [name] is a 70 y.o. female who is being seen today for the evaluation of VF at the request of Dr. Debara Pickett.  PMHx of CAD (3-vessel CAD by cath in 04/2015 --> CABG consideration in the future), record reports she has declined CABG,PAFib,  DM, VT w/ HCM and ICD in place, chronic CHF (diastolic), orthostatic hypotension and moderate pulmonary HTN who presents to [facility] ED on 11/27/2016 following a syncopal episode.     The patient reports she has been feeling quite well without any symptoms until about a week ago feeling unusually SOB with minimal activities.  She had no CP.  Was up to use the BR when she became very dizzy and fainted, upon arrival to ER her ICD was interrogated and noted she had VF and received multiple shocks, none of which the patient recalls.  LABS K+ 3.9 Mag  Ordered BUN/Creat 37/1.17 BNP 920 Trop I: 1.56 H/H 9.7/31.1 WBC 13.3 plts 347  Device information: SJM dual chamber ICD, implanted 04/16/15, Dr. Lovena Le Interrogation: Battery stable 9 episode, 3 NSVT, 6 VF The first 3 VF episodes received ATP during charge without success and 30J, the second 3 were straight 30J shocks Only last 2 of 6 episodes details are available to review Also noted AMS episodes that are AFib  Past Medical History:  Diagnosis Date  . Abnormal PFT   . CAD (coronary artery disease), native coronary artery - 3 vessel 04/21/2015   a. NSTEMI 8-04/2015 felt 2/2 demand ischemia. b. 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  . Chronic diastolic CHF (congestive heart failure) ([place])   . Diabetes mellitus ([place])   . Diabetes mellitus  without complication ([place])    dx on wed  . Hypertrophic cardiomyopathy ([place])   . Left atrial enlargement   . Mitral regurgitation    a. Echo 04/2015: moderate mitral regurgitation. b. F/u echo 05/2015: mild MR.  . Paroxysmal atrial fibrillation ([place])   . Shortness of breath dyspnea   . Tricuspid regurgitation    a. Echo 04/2015: Mod-severe TR. b. Not mentioned on echo 05/2015  . Ventricular tachycardia ([place])    a. s/p St. Jude ICD implanted 04/2015.     Surgical History:  Past Surgical History:  Procedure Laterality Date  . APPLICATION OF WOUND VAC Left 01/06/2013   Procedure: APPLICATION OF WOUND VAC X 2;  Surgeon: [name], MD;  Location: [facility] ORS;  Service: Orthopedics;  Laterality: Left;  left forearm  . APPLICATION OF WOUND VAC Left 01/09/2013   Procedure: APPLICATION OF WOUND VAC;  Surgeon: [name], MD;  Location: [facility];  Service: Orthopedics;  Laterality: Left;  . CARDIAC CATHETERIZATION N/A 04/19/2015   Procedure: Left Heart Cath and Coronary Angiography;  Surgeon: [name], MD;  Location: [facility];  Service: Cardiovascular;  Laterality: N/A;  . CARDIOVERSION N/A 09/20/2015   Procedure: CARDIOVERSION;  Surgeon: Pixie Casino, MD;  Location: [facility] ENDOSCOPY;  Service: Cardiovascular;  Laterality: N/A;  . DENTAL SURGERY  Oct. 2011   several extractions, bone graft  . EP IMPLANTABLE DEVICE N/A 04/16/2015   Procedure: ICD Implant;  Surgeon: [name], MD;  Location:  [facility] INVASIVE CV LAB;  Service: Cardiovascular;  Laterality: N/A;  . I&D EXTREMITY Left 01/06/2013   Procedure: IRRIGATION AND DEBRIDEMENT LEFT ELBOW AND LEFT FOREARM ;  Surgeon: [name], MD;  Location: [facility];  Service: Orthopedics;  Laterality: Left;  . INCISION AND DRAINAGE Left 01/09/2013   Procedure: REDO INCISION AND DRAINAGE LEFT ELBOW;  Surgeon: [name], MD;  Location: [facility];  Service: Orthopedics;  Laterality: Left;  SUPINE, UPPER EXTERMITY DRAPE  . SECONDARY CLOSURE  OF WOUND Left 01/09/2013   Procedure: SECONDARY CLOSURE OF WOUND  LEFT ELBOW;  Surgeon: [name], MD;  Location: [facility];  Service: Orthopedics;  Laterality: Left;  . TEE WITHOUT CARDIOVERSION N/A 09/20/2015   Procedure: TRANSESOPHAGEAL ECHOCARDIOGRAM (TEE);  Surgeon: Pixie Casino, MD;  Location: [facility] END[facility]COPY;  Service: Cardiovascular;  Laterality: N/A;      (Not in a hospital admission)  Inpatient Medications:  . furosemide  40 mg Intravenous BID    Allergies: No Known Allergies  Social History   Social History  . Marital status: Single    Spouse name: N/A  . Number of children: N/A  . Years of education: N/A   Occupational History  . Not on file.   Social History Main Topics  . Smoking status: Former Smoker    Years: 4.00    Quit date: 12/31/1980  . Smokeless tobacco: Never Used  . Alcohol use No  . Drug use: No  . Sexual activity: Not Currently   Other Topics Concern  . Not on file   Social History Narrative   Divorced.  Lives with daughter and grandson. 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     Family History  Problem Relation Age of Onset  . Breast cancer Mother   . Diabetes Father   . Stroke Father   . Heart disease Sister   . Healthy Sister      Review of Systems: All other systems reviewed and are otherwise negative except as noted above.  Physical Exam: Vitals:   11/27/16 1000 11/27/16 1015 11/27/16 1048 11/27/16 1100  BP: 139/78 (!) 121/92 (!) 146/75 138/75  Pulse: 77 80 77 76  Resp: (!) 29 (!) 26 (!) 28 (!) 26  Temp:      TempSrc:      SpO2: 93% 94% 94% 95%  Weight:      Height:        GEN- The patient is well appearing, in NAD, eating lunch visiting with visitors, alert and oriented x 3 today.   HEENT: normocephalic, atraumatic; sclera clear, conjunctiva pink; hearing intact; oropharynx clear; neck supple, no JVP Lymph- no cervical lymphadenopathy Lungs- slightly diminished at the bases, normal work of breathing.  No wheezes,  rales, rhonchi Heart- RRR, no murmurs, rubs or gallops, PMI not laterally displaced GI- soft, non-tender, non-distended Extremities- no clubbing, cyanosis, 1+ edema MS- no significant deformity or atrophy Skin- warm and dry, no rash or lesion Psych- euthymic mood, full affect Neuro- no gross deficits observed  Labs:   Lab Results  Component Value Date   WBC 13.3 (H) 11/27/2016   HGB 9.7 (L) 11/27/2016   HCT 31.1 (L) 11/27/2016   MCV 87.1 11/27/2016   PLT 347 11/27/2016    Recent Labs Lab 11/27/16 0159  NA 137  K 3.9  CL 106  CO2 21*  BUN 37*  CREATININE 1.17*  CALCIUM 8.9  PROT 6.9  BILITOT 0.9  ALKPHOS 96  ALT 14  AST 16  GLUCOSE 169*      Radiology/Studies:  Dg Chest 2 View Result Date: 11/27/2016 CLINICAL DATA:  71 year old female with shortness of breath. History of CHF an AP. EXAM: CHEST  2 VIEW COMPARISON:  Chest radiograph dated 05/19/2016 FINDINGS: There is stable cardiomegaly. Streaky perihilar and central densities as well as diffuse interstitial prominence consistent with vascular congestion and edema. Pneumonia is not excluded. Small left pleural effusion with subsegmental atelectatic changes of the left lung base. Superimposed pneumonia is not excluded. Clinical correlation is recommended. Left pectoral dual lead AICD device. No acute osseous pathology. IMPRESSION: Cardiomegaly with findings of CHF and small left pleural effusion. Left lung base subsegmental atelectasis versus infiltrate. Electronically Signed   By: Anner Crete M.D.   On: 11/27/2016 02:43   Ct Head Wo Contrast Result Date: 11/27/2016 CLINICAL DATA:  70 year old female with possible head trauma. EXAM: CT HEAD WITHOUT CONTRAST CT CERVICAL SPINE WITHOUT CONTRAST TECHNIQUE: Multidetector CT imaging of the head and cervical spine was performed following the standard protocol without intravenous contrast. Multiplanar CT image reconstructions of the cervical spine were also generated. COMPARISON:   Head CT dated 05/11/2015 FINDINGS: CT HEAD FINDINGS Brain: The ventricles and sulci appropriate size for patient's age. Mild periventricular and deep white matter chronic microvascular ischemic changes noted. There is no acute intracranial hemorrhage. No mass effect or midline shift noted. No extra-axial fluid collections. Vascular: No hyperdense vessel or unexpected calcification. Skull: Normal. Negative for fracture or focal lesion. Sinuses/Orbits: There is a 2.1 x 1.8 cm calcified mass in the right maxillary sinus which appears to arise from the right maxillary bone. This lesion is incompletely visualized but appears new compared to the CT of 05/11/2015. This may represent an osteoma, ossifying fibroma or an odontogenic lesion. If the patient has symptoms of sinusitis or right-sided dental pain a nonemergent facial CT could be considered. No definite aggressive features noted, however disease lesion is partially visualized. The visualized paranasal sinuses and mastoid air cells are clear. No air-fluid levels. Other: Small left parietal scalp contusion. CT CERVICAL SPINE FINDINGS Alignment: No acute subluxation. Skull base and vertebrae: No acute fracture. No primary bone lesion or focal pathologic process. Soft tissues and spinal canal: No prevertebral fluid or swelling. No visible canal hematoma. Disc levels: There is degenerative changes with endplate irregularity and disc space narrowing and C6-C7. There is associated osteophyte at this level. Multilevel facet hypertrophy noted. Upper chest: Stop partially visualized bilateral pleural effusions. Left pectoral pacemaker device partially visualized. Probable calcified thyroid nodules. Ultrasound may provide better evaluation of the thyroid gland. Other: None IMPRESSION: 1. No acute intracranial hemorrhage. Mild chronic microvascular ischemic changes. 2. No acute/traumatic cervical spine pathology. 3. Rounded calcified mass in the right maxillary sinus, new  compared to the prior CT. This may represent an osteoma, an ossifying fibroma or an odontogenic lesion. Correlation with clinical exam recommended. CT of the maxillofacial bones on a non emergent basis may provide better evaluation. 4. Partially visualized bilateral pleural effusions. Electronically Signed   By: Anner Crete M.D.   On: 11/27/2016 03:00      Reviewed by myself: EKG: ST, 103bpm, PR 245ms, IVCD QRS 11ms, QTc 536ms TELEMETRY: SR, PACs  09/18/15: TTE Study Conclusions - Left ventricle: The cavity size was normal. There was moderate   focal basal hypertrophy. Systolic function was mildly reduced.   The estimated ejection fraction was in the range of 45% to 50%.   There is akinesis of the basalinferior myocardium. Cannot  exclude   akinesis of the entireinferolateral myocardium. The study is not   technically sufficient to allow evaluation of LV diastolic   function. Doppler parameters are consistent with high ventricular   filling pressure. - Mitral valve: Calcified annulus. Mild diffuse thickening of the   anterior leaflet and posterior leaflet. There was mild   regurgitation. - Left atrium: The atrium was severely dilated. - Pulmonary arteries: PA peak pressure: 46 mm Hg (S). - Pericardium, extracardiac: There was a left pleural effusion. Impressions: - The right ventricular systolic pressure was increased consistent   with moderate pulmonary hypertension.    Assessment and Plan:   1. Syncope/VF     6 episodes all successfully terminated with shocks     Will start Mexiletine, highly suspect ischemic trigger given non-revascularized CAD  2. CAD     No CP but 1 week of unusual SOB     1st Trop abnormal     Plans for cath tomorrow     On heparin gtt     c/w cardiology team  3. PAFib     CHA2DS2Vasc is at least 5, on Xarelto out pt, held here for lanned procedure tomorrow     On heparin gtt  4. Hx of HCM w/ICD     Battery status is stable, device function  is intact    Signed, Tommye Standard, PA-C 11/27/2016 11:26 AM   I have seen and examined this patient with Tommye Standard.  Agree with above, note added to reflect my findings.  On exam, RRR, no murmurs, lungs clear. Presented to the hospital after shocks by her ICD. Interrogation shows ventricular fibrillation. Patient has been feeling poorly for the last few weeks. Plan for [facility] tomorrow to further define her conroary anatomy. Has declined bypass in the past. She does not wish to be on amiodarone as she had side effects in the past. Will start her on mexiletine today.    Will M. Camnitz MD 11/27/2016 12:34 PM

## 2016-11-28 NOTE — Care Management Note (Signed)
Case Management Note  Patient Details  Name: ELWYN LOWDEN MRN: [id number] Date of Birth: [birth year]  Subjective/Objective:                 Patient admitted form home for NSTEMI, cath 4/27 multiple occluded/ semi occluded vessels, patient under consideration for CABG.    Action/Plan:   Expected Discharge Date:                  Expected Discharge Plan:  [place]  In-House Referral:     Discharge planning Services  CM Consult  Post Acute Care Choice:    Choice offered to:     DME Arranged:    DME Agency:     HH Arranged:    [place] Agency:     Status of Service:  In process, will continue to follow  If discussed at Long Length of Stay Meetings, dates discussed:    Additional Comments:  Carles Collet, RN 11/28/2016, 12:23 PM

## 2016-11-28 NOTE — Progress Notes (Signed)
Pt rhythm appeared as Vtach at a rate of 120. Pt was asymptomatic. EKG obtained, which showed wide QRS.

## 2016-11-28 NOTE — Progress Notes (Signed)
Multiple unsuccessful attempts made by IV team member to inset second PIV per cath order set.

## 2016-11-28 NOTE — Progress Notes (Signed)
ANTICOAGULATION CONSULT NOTE - Follow Up Consult  Pharmacy Consult for Heparin (Xarelto on hold) Indication: chest pain/ACS and atrial fibrillation  No Known Allergies  Patient Measurements: Height: 5\' 4"  (162.6 cm) Weight: 176 lb 2.4 oz (79.9 kg) IBW/kg (Calculated) : 54.7  Vital Signs: Temp: 98.2 F (36.8 C) (04/27 0016) Temp Source: Oral (04/27 0016) BP: 121/59 (04/26 1954) Pulse Rate: 80 (04/27 0016)  Labs:  Recent Labs  11/27/16 0159 11/27/16 0538 11/27/16 1404 11/27/16 1924 11/28/16 0108  HGB 9.7*  --   --   --   --   HCT 31.1*  --   --   --   --   PLT 347  --   --   --   --   APTT  --  34  --   --  47*  HEPARINUNFRC  --  >2.20*  --   --   --   CREATININE 1.17*  --   --   --   --   TROPONINI 1.56*  --  1.92* 1.42* 1.29*    Estimated Creatinine Clearance: 47.1 mL/min (A) (by C-G formula based on SCr of 1.17 mg/dL (H)).   Assessment: Heparin for elevated troponin, possible cath today, aPTT is low this AM, using aPTT to dose heparin for now given Xarelto influence on anti-Xa levels.  Goal of Therapy:  Heparin level 0.3-0.7 units/ml aPTT 66-102 seconds Monitor platelets by anticoagulation protocol: Yes   Plan:  -Inc heparin to 1150 units/hr -1200 aPTT  Narda Bonds 11/28/2016,3:42 AM

## 2016-11-29 ENCOUNTER — Inpatient Hospital Stay (HOSPITAL_COMMUNITY): Payer: PPO

## 2016-11-29 DIAGNOSIS — I501 Left ventricular failure: Secondary | ICD-10-CM

## 2016-11-29 LAB — BASIC METABOLIC PANEL
ANION GAP: 12 (ref 5–15)
BUN: 34 mg/dL — ABNORMAL HIGH (ref 6–20)
CO2: 22 mmol/L (ref 22–32)
Calcium: 8.7 mg/dL — ABNORMAL LOW (ref 8.9–10.3)
Chloride: 105 mmol/L (ref 101–111)
Creatinine, Ser: 1.16 mg/dL — ABNORMAL HIGH (ref 0.44–1.00)
GFR calc Af Amer: 55 mL/min — ABNORMAL LOW (ref >60)
GFR, EST NON AFRICAN AMERICAN: 47 mL/min — AB (ref >60)
GLUCOSE: 151 mg/dL — AB (ref 65–99)
POTASSIUM: 4.1 mmol/L (ref 3.5–5.1)
Sodium: 139 mmol/L (ref 135–145)

## 2016-11-29 LAB — ECHOCARDIOGRAM COMPLETE
CHL CUP MV DEC (S): 176
CHL CUP RV SYS PRESS: 87 mmHg
CHL CUP TV REG PEAK VELOCITY: 452 cm/s
E decel time: 176 msec
EERAT: 21.03
FS: 15 % — AB (ref 28–44)
Height: 64 in
IV/PV OW: 0.91
LA diam end sys: 52 mm
LA vol A4C: 104 ml
LADIAMINDEX: 2.7 cm/m2
LASIZE: 52 mm
LAVOL: 119 mL
LAVOLIN: 61.8 mL/m2
LV PW d: 10.6 mm — AB (ref 0.6–1.1)
LV SIMPSON'S DISK: 42
LV dias vol: 129 mL — AB (ref 46–106)
LV sys vol: 75 mL — AB (ref 14–42)
LVDIAVOLIN: 67 mL/m2
LVEEAVG: 21.03
LVEEMED: 21.03
LVELAT: 5.8 cm/s
LVOT VTI: 20.2 cm
LVOT area: 2.27 cm2
LVOTD: 17 mm
LVOTPV: 76.9 cm/s
LVOTSV: 46 mL
LVSYSVOLIN: 39 mL/m2
Lateral S' vel: 8.44 cm/s
MV pk A vel: 66.5 m/s
MVPG: 6 mmHg
MVPKEVEL: 122 m/s
RV TAPSE: 22.4 mm
Stroke v: 54 ml
TDI e' lateral: 5.8
TDI e' medial: 3.26
TR max vel: 452 cm/s
Weight: 2828.8 oz

## 2016-11-29 LAB — CBC
HEMATOCRIT: 29.6 % — AB (ref 36.0–46.0)
HEMOGLOBIN: 9.4 g/dL — AB (ref 12.0–15.0)
MCH: 28 pg (ref 26.0–34.0)
MCHC: 31.8 g/dL (ref 30.0–36.0)
MCV: 88.1 fL (ref 78.0–100.0)
Platelets: 314 10*3/uL (ref 150–400)
RBC: 3.36 MIL/uL — ABNORMAL LOW (ref 3.87–5.11)
RDW: 15.9 % — ABNORMAL HIGH (ref 11.5–15.5)
WBC: 7.1 10*3/uL (ref 4.0–10.5)

## 2016-11-29 LAB — GLUCOSE, CAPILLARY
Glucose-Capillary: 150 mg/dL — ABNORMAL HIGH (ref 65–99)
Glucose-Capillary: 156 mg/dL — ABNORMAL HIGH (ref 65–99)
Glucose-Capillary: 116 mg/dL — ABNORMAL HIGH (ref 65–99)

## 2016-11-29 LAB — APTT
APTT: 77 s — AB (ref 24–36)
APTT: 61 s — AB (ref 24–36)
aPTT: 69 seconds — ABNORMAL HIGH (ref 24–36)

## 2016-11-29 LAB — HEPARIN LEVEL (UNFRACTIONATED): HEPARIN UNFRACTIONATED: 1.12 [IU]/mL — AB (ref 0.30–0.70)

## 2016-11-29 LAB — SURGICAL PCR SCREEN
MRSA, PCR: NEGATIVE
STAPHYLOCOCCUS AUREUS: NEGATIVE

## 2016-11-29 MED ORDER — SODIUM CHLORIDE 0.9 % IV SOLN
8.0000 mg | Freq: Once | INTRAVENOUS | Status: AC
  Administered 2016-11-29: 8 mg via INTRAVENOUS
  Filled 2016-11-29: qty 4

## 2016-11-29 MED ORDER — ATORVASTATIN CALCIUM 80 MG PO TABS
80.0000 mg | ORAL_TABLET | Freq: Every day | ORAL | Status: DC
  Administered 2016-11-29 – 2016-12-08 (×8): 80 mg via ORAL
  Filled 2016-11-29 (×9): qty 1

## 2016-11-29 MED ORDER — ALUM & MAG HYDROXIDE-SIMETH 200-200-20 MG/5ML PO SUSP
30.0000 mL | ORAL | Status: DC | PRN
  Administered 2016-11-29 – 2016-12-02 (×3): 30 mL via ORAL
  Filled 2016-11-29 (×3): qty 30

## 2016-11-29 MED ORDER — METOPROLOL TARTRATE 5 MG/5ML IV SOLN
5.0000 mg | Freq: Once | INTRAVENOUS | Status: AC
  Administered 2016-11-29: 5 mg via INTRAVENOUS
  Filled 2016-11-29: qty 5

## 2016-11-29 MED ORDER — PROMETHAZINE HCL 25 MG/ML IJ SOLN
12.5000 mg | Freq: Four times a day (QID) | INTRAMUSCULAR | Status: DC | PRN
  Filled 2016-11-29: qty 1

## 2016-11-29 NOTE — Progress Notes (Signed)
ANTICOAGULATION CONSULT NOTE - Follow Up Consult  Pharmacy Consult for Heparin (Xarelto on hold) Indication: chest pain/ACS and atrial fibrillation, plan for CABG 5/3  No Known Allergies  Patient Measurements: Height: 5\' 4"  (162.6 cm) Weight: 175 lb (79.4 kg) IBW/kg (Calculated) : 54.7  Vital Signs: Temp: 98.3 F (36.8 C) (04/27 2355) Temp Source: Oral (04/27 2355) BP: 113/86 (04/27 2355) Pulse Rate: 114 (04/27 2355)  Labs:  Recent Labs  11/27/16 0159 11/27/16 0538 11/27/16 1404 11/27/16 1924 11/28/16 0108 11/28/16 0542 11/28/16 0741 11/29/16 0018  HGB 9.7*  --   --   --   --   --  9.2* 9.4*  HCT 31.1*  --   --   --   --   --  29.4* 29.6*  PLT 347  --   --   --   --   --  319 314  APTT  --  34  --   --  47*  --   --  61*  HEPARINUNFRC  --  >2.20*  --   --   --   --   --  1.12*  CREATININE 1.17*  --   --   --   --  1.19*  --  1.16*  TROPONINI 1.56*  --  1.92* 1.42* 1.29*  --   --   --     Estimated Creatinine Clearance: 47.3 mL/min (A) (by C-G formula based on SCr of 1.16 mg/dL (H)).  Assessment: Heparin while awaiting CABG on 5/3, aPTT is low this AM, using aPTT to dose heparin for now given Xarelto influence on anti-Xa levels. No issues per RN.   Goal of Therapy:  Heparin level 0.3-0.7 units/ml aPTT 66-102 seconds Monitor platelets by anticoagulation protocol: Yes   Plan:  -Inc heparin to 1250 units/hr -1000 aPTT  Narda Bonds 11/29/2016,1:14 AM

## 2016-11-29 NOTE — Progress Notes (Signed)
  Echocardiogram 2D Echocardiogram has been performed.  [name] 11/29/2016, 4:16 PM

## 2016-11-29 NOTE — Progress Notes (Signed)
ANTICOAGULATION CONSULT NOTE - Follow Up Consult  Pharmacy Consult for Heparin (Xarelto on hold) Indication: chest pain/ACS and atrial fibrillation, plan for CABG 5/3  No Known Allergies  Patient Measurements: Height: 5\' 4"  (162.6 cm) Weight: 176 lb 12.8 oz (80.2 kg) IBW/kg (Calculated) : 54.7  Vital Signs: Temp: 98.2 F (36.8 C) (04/28 0753) Temp Source: Oral (04/28 0753) BP: 112/61 (04/28 0753) Pulse Rate: 60 (04/28 0753)  Labs:  Recent Labs  11/27/16 0159  11/27/16 0538 11/27/16 1404 11/27/16 1924 11/28/16 0108 11/28/16 0542 11/28/16 0741 11/29/16 0018 11/29/16 0943  HGB 9.7*  --   --   --   --   --   --  9.2* 9.4*  --   HCT 31.1*  --   --   --   --   --   --  29.4* 29.6*  --   PLT 347  --   --   --   --   --   --  319 314  --   APTT  --   < > 34  --   --  47*  --   --  61* 69*  HEPARINUNFRC  --   --  >2.20*  --   --   --   --   --  1.12*  --   CREATININE 1.17*  --   --   --   --   --  1.19*  --  1.16*  --   TROPONINI 1.56*  --   --  1.92* 1.42* 1.29*  --   --   --   --   < > = values in this interval not displayed.  Estimated Creatinine Clearance: 47.6 mL/min (A) (by C-G formula based on SCr of 1.16 mg/dL (H)).  Assessment: Heparin for afib + ACS while awaiting CABG on 5/3.   aPTT now therapeutic at 69, using aPTT to dose heparin for now given Xarelto influence on anti-Xa levels (last dose 4/25). Hgb remains low but stable, plt wnl and stable. No reported significant s/s bleeding.   Goal of Therapy:  Heparin level 0.3-0.7 units/ml aPTT 66-102 seconds Monitor platelets by anticoagulation protocol: Yes   Plan:  - Continue heparin gtt at 1250 units/hr - aPTT in 6 hrs to confirm - Daily aptt/HL until correlating, CBC   Saranya Harlin K. Velva Harman, PharmD, BCPS, CPP Clinical Pharmacist Pager: [phone] Phone: [phone] 11/29/2016 11:24 AM

## 2016-11-29 NOTE — Progress Notes (Signed)
Progress Note  Patient Name: [name] Date of Encounter: 11/29/2016  Primary Cardiologist: Dr. Haroldine Laws Primary Electrophysiologist: Dr. Lovena Le  Subjective   Denies any chest discomfort or SOB. Poor sleep since arrive, says the IV machine was beeping all night.   Inpatient Medications    Scheduled Meds: . furosemide  40 mg Intravenous BID  . insulin aspart  0-15 Units Subcutaneous TID WC  . mexiletine  300 mg Oral Q8H  . senna-docusate  1 tablet Oral BID  . sodium chloride flush  3 mL Intravenous Q12H  . sodium chloride flush  3 mL Intravenous Q12H   Continuous Infusions: . sodium chloride    . sodium chloride    . heparin 1,250 Units/hr (11/29/16 0120)   PRN Meds: sodium chloride, sodium chloride, acetaminophen, acetaminophen, ALPRAZolam, alum & mag hydroxide-simeth, magnesium hydroxide, ondansetron (ZOFRAN) IV, oxyCODONE-acetaminophen, sodium chloride flush, sodium chloride flush   Vital Signs    Vitals:   11/28/16 2034 11/28/16 2355 11/29/16 0414 11/29/16 0423  BP: 132/69 113/86 117/61   Pulse: 66 (!) 114    Resp: (!) 21 (!) 21 (!) 22   Temp: 98.3 F (36.8 C) 98.3 F (36.8 C) 97.7 F (36.5 C)   TempSrc:  Oral Oral   SpO2: 92% 93% 92%   Weight:    176 lb 12.8 oz (80.2 kg)  Height:        Intake/Output Summary (Last 24 hours) at 11/29/16 0733 Last data filed at 11/29/16 0700  Gross per 24 hour  Intake          1081.33 ml  Output             1700 ml  Net          -618.67 ml   Filed Weights   11/27/16 1335 11/28/16 0600 11/29/16 0423  Weight: 176 lb 2.4 oz (79.9 kg) 175 lb (79.4 kg) 176 lb 12.8 oz (80.2 kg)    Telemetry    2 episode of wide complex tachycardia with HR 120 around 11:30pm and 11:58pm last night. - Personally Reviewed  ECG    Wide complex tachycardia, VT vs afib abberancy - Personally Reviewed  Physical Exam   Affect appropriate Healthy:  appears stated age 71: normal Neck supple with no adenopathy JVP normal no  bruits no thyromegaly Lungs clear with no wheezing and good diaphragmatic motion Heart:  S1/S2 no murmur, no rub, gallop or click PMI normal Abdomen: benighn, BS positve, no tenderness, no AAA no bruit.  No HSM or HJR Distal pulses intact with no bruits No edema Neuro non-focal Skin warm and dry No muscular weakness   Labs    Chemistry Recent Labs Lab 11/27/16 0159 11/28/16 0542 11/29/16 0018  NA 137 138 139  K 3.9 4.0 4.1  CL 106 107 105  CO2 21* 22 22  GLUCOSE 169* 159* 151*  BUN 37* 36* 34*  CREATININE 1.17* 1.19* 1.16*  CALCIUM 8.9 8.6* 8.7*  PROT 6.9  --   --   ALBUMIN 3.5  --   --   AST 16  --   --   ALT 14  --   --   ALKPHOS 96  --   --   BILITOT 0.9  --   --   GFRNONAA 47* 46* 47*  GFRAA 54* 53* 55*  ANIONGAP 10 9 12      Hematology Recent Labs Lab 11/27/16 0159 11/28/16 0741 11/29/16 0018  WBC 13.3* 7.9 7.1  RBC 3.57* 3.37*  3.36*  HGB 9.7* 9.2* 9.4*  HCT 31.1* 29.4* 29.6*  MCV 87.1 87.2 88.1  MCH 27.2 27.3 28.0  MCHC 31.2 31.3 31.8  RDW 14.9 15.3 15.9*  PLT 347 319 314    Cardiac Enzymes Recent Labs Lab 11/27/16 0159 11/27/16 1404 11/27/16 1924 11/28/16 0108  TROPONINI 1.56* 1.92* 1.42* 1.29*   No results for input(s): TROPIPOC in the last 168 hours.   BNP Recent Labs Lab 11/27/16 0159  BNP 920.0*     DDimer No results for input(s): DDIMER in the last 168 hours.   Radiology    No results found.  Cardiac Studies   Cath 11/28/2016 Conclusion    Severe three-vessel native coronary disease with chronic total occlusion of the RCA supplied by left-to-right collaterals. Inferior wall branches are graftable.  Total occlusion of the mid circumflex with retrograde filling of 2 large obtuse marginal branches. Branches appear graftable.  Severe LAD and diagonal disease with subtotal occlusion in the mid vessel of the LAD. 90% stenosis in the proximal portion of large diagonal.  Elevated left ventricular end-diastolic pressure  during tachycardia.  Sustained, hemodynamically stable ventricular tachycardia during the procedure that required pace termination via the AICD.  RECOMMENDATIONS:   Assessment of LV function by CT and/or echo with specific attention to the presence absence of an LV aneurysm involving the inferior wall.  TCTS notified of need for CABG and ? Aneurysm resection if present.     Patient Profile     71 y.o. female  with past medical history of CAD (3-vessel CAD by cath in 04/2015 --> CABG consideration in the future), PAF (on Xarelto), Type 2 DM, VT (s/p placement of St. Jude ICD in 04/2015), chronic diastolic CHF, orthostatic hypotension and moderate pulmonary HTN who presents to [facility] ED on 11/27/2016 following a syncopal episode secondary to VFib requiring ICD firing. Cath showed 3v CAD, undergoing TCTS evaluation.  Assessment & Plan    1. NSTEMI with 3V CAD  - known 3v disease since 04/2015 on cath, CABG recommended, but she declined.   - cath 11/28/2016 showed 90% prox diag, occluded RCA and LCx, severe LAD disease with occlusion of mid LAD.   - CTCS consulted, tentatively plan for CABG on 12/04/2016 for CABG with MAZE and atrial appendage clip +/- LV aneurysm repair.   - pending echo to evaluate vale and LV aneurysm prior to surgery   2. Syncope 2/2 ventricular fibrillation  - s/p placement of St. Jude ICD in 04/2015. Interrogation after syncope showed 6 episodes of vfin  - seen by EP 11/27/2016, started on mexiletine. Suspect triggered by ischemia  - 2 more episode of wide complex tachycardia around 11:30 pm and 11:58 pm last night. See EKG in chart. Continue Mexiletine.   3. PAF  This patients CHA2DS2-VASc Score and unadjusted Ischemic Stroke Rate (% per year) is equal to 7.2 % stroke rate/year from a score of 5  Above score calculated as 1 point each if present [CHF, HTN, DM, Vascular=MI/PAD/Aortic Plaque, Age if 65-74, or Female] Above score calculated as 2 points each if  present [Age > 75, or Stroke/TIA/TE]  - Xarelto held. On IV heparin  4. Acute on chronic diastolic heart failure  - she appears to be close to euvolemic level, still has mild crackle in the right base. Transition to 40mg  daily of lasix tomorrow.   5. Prolonged QT  6. DM II: SSI  7. Chronic anemia     Signed, [name], [place]  11/29/2016,  7:33 AM    Patient examined chart reviewed. Echo is pending hopefully will be done over weekend No chest pain or CHF rhythm is stable on heparin for CAD and PAF. Dr Roxan Hockey to  Operate on next week exam with no murmur and clear lungs   Jenkins Rouge

## 2016-11-29 NOTE — Plan of Care (Signed)
Problem: Education: Goal: Knowledge of [facility] Education information/materials will improve Outcome: Progressing Patient aware of plan of care.  RN provided medication education on all medications administered thus far this shift prior to administration.  Patient stated understanding.     

## 2016-11-29 NOTE — Progress Notes (Signed)
Patient in normal sinus rhythm beginning of this shift.  Heart rhythm changed, EKG completed showing wide QRS tachycardia.  Heart rate sustained 114.  Patient denies shortness of breath and/or chest pain but does feel like her heart is beating faster than it was.  Appears this happened 11/28/2016 in the AM as well.  Cardiology paged.  02 2L nasal cannula applied at this time for comfort.  Oxygen saturation 93% on room air.

## 2016-11-29 NOTE — Progress Notes (Signed)
Patient given 5mg  IVP metoprolol per MD order at 0046, about 0101 patient back in normal sinus rhythm.  Heart rate in the 60's.

## 2016-11-29 NOTE — Progress Notes (Signed)
ANTICOAGULATION CONSULT NOTE - Follow Up Consult  Pharmacy Consult for Heparin (Xarelto on hold) Indication: chest pain/ACS and atrial fibrillation, plan for CABG 5/3  No Known Allergies  Patient Measurements: Height: 5\' 4"  (162.6 cm) Weight: 176 lb 12.8 oz (80.2 kg) IBW/kg (Calculated) : 54.7  Vital Signs: Temp: 98.1 F (36.7 C) (04/28 1646) Temp Source: Oral (04/28 1646) BP: 144/71 (04/28 1646) Pulse Rate: 71 (04/28 1646)  Labs:  Recent Labs  11/27/16 0159  11/27/16 0538 11/27/16 1404 11/27/16 1924 11/28/16 0108 11/28/16 0542 11/28/16 0741 11/29/16 0018 11/29/16 0943 11/29/16 1612  HGB 9.7*  --   --   --   --   --   --  9.2* 9.4*  --   --   HCT 31.1*  --   --   --   --   --   --  29.4* 29.6*  --   --   PLT 347  --   --   --   --   --   --  319 314  --   --   APTT  --   < > 34  --   --  47*  --   --  61* 69* 77*  HEPARINUNFRC  --   --  >2.20*  --   --   --   --   --  1.12*  --   --   CREATININE 1.17*  --   --   --   --   --  1.19*  --  1.16*  --   --   TROPONINI 1.56*  --   --  1.92* 1.42* 1.29*  --   --   --   --   --   < > = values in this interval not displayed.  Estimated Creatinine Clearance: 47.6 mL/min (A) (by C-G formula based on SCr of 1.16 mg/dL (H)).  Assessment: Heparin for afib + ACS while awaiting CABG on 5/3.   PTT therapeutic x 2  Goal of Therapy:  Heparin level 0.3-0.7 units/ml aPTT 66-102 seconds Monitor platelets by anticoagulation protocol: Yes   Plan:  - Continue heparin gtt at 1250 units/hr - Daily aptt/HL until correlating, CBC   Thank you Anette Guarneri, PharmD [phone] 11/29/2016 5:01 PM

## 2016-11-30 LAB — CBC
HCT: 29.3 % — ABNORMAL LOW (ref 36.0–46.0)
HEMOGLOBIN: 8.9 g/dL — AB (ref 12.0–15.0)
MCH: 27.1 pg (ref 26.0–34.0)
MCHC: 30.4 g/dL (ref 30.0–36.0)
MCV: 89.1 fL (ref 78.0–100.0)
Platelets: 321 10*3/uL (ref 150–400)
RBC: 3.29 MIL/uL — AB (ref 3.87–5.11)
RDW: 15.6 % — ABNORMAL HIGH (ref 11.5–15.5)
WBC: 9.8 10*3/uL (ref 4.0–10.5)

## 2016-11-30 LAB — GLUCOSE, CAPILLARY
GLUCOSE-CAPILLARY: 163 mg/dL — AB (ref 65–99)
Glucose-Capillary: 112 mg/dL — ABNORMAL HIGH (ref 65–99)
Glucose-Capillary: 127 mg/dL — ABNORMAL HIGH (ref 65–99)
Glucose-Capillary: 146 mg/dL — ABNORMAL HIGH (ref 65–99)

## 2016-11-30 LAB — BASIC METABOLIC PANEL
Anion gap: 8 (ref 5–15)
BUN: 31 mg/dL — ABNORMAL HIGH (ref 6–20)
CO2: 25 mmol/L (ref 22–32)
CREATININE: 1.18 mg/dL — AB (ref 0.44–1.00)
Calcium: 8.7 mg/dL — ABNORMAL LOW (ref 8.9–10.3)
Chloride: 106 mmol/L (ref 101–111)
GFR calc non Af Amer: 46 mL/min — ABNORMAL LOW (ref >60)
GFR, EST AFRICAN AMERICAN: 54 mL/min — AB (ref >60)
Glucose, Bld: 90 mg/dL (ref 65–99)
Potassium: 3.9 mmol/L (ref 3.5–5.1)
Sodium: 139 mmol/L (ref 135–145)

## 2016-11-30 LAB — HEPARIN LEVEL (UNFRACTIONATED): Heparin Unfractionated: 0.92 IU/mL — ABNORMAL HIGH (ref 0.30–0.70)

## 2016-11-30 LAB — APTT: aPTT: 86 seconds — ABNORMAL HIGH (ref 24–36)

## 2016-11-30 NOTE — Progress Notes (Signed)
ANTICOAGULATION CONSULT NOTE - Follow Up Consult  Pharmacy Consult for Heparin (Xarelto on hold) Indication: chest pain/ACS and atrial fibrillation, plan for CABG 5/3  No Known Allergies  Patient Measurements: Height: 5\' 4"  (162.6 cm) Weight: 174 lb 8 oz (79.2 kg) IBW/kg (Calculated) : 54.7  Vital Signs: Temp: 98.7 F (37.1 C) (04/29 0736) Temp Source: Oral (04/29 0736) BP: 117/54 (04/29 0736) Pulse Rate: 75 (04/29 0736)  Labs:  Recent Labs  11/27/16 1404 11/27/16 1924  11/28/16 0108 11/28/16 0542  11/28/16 0741 11/29/16 0018 11/29/16 0943 11/29/16 1612 11/30/16 0445  HGB  --   --   --   --   --   < > 9.2* 9.4*  --   --  8.9*  HCT  --   --   --   --   --   --  29.4* 29.6*  --   --  29.3*  PLT  --   --   --   --   --   --  319 314  --   --  321  APTT  --   --   < > 47*  --   --   --  61* 69* 77* 86*  HEPARINUNFRC  --   --   --   --   --   --   --  1.12*  --   --  0.92*  CREATININE  --   --   --   --  1.19*  --   --  1.16*  --   --  1.18*  TROPONINI 1.92* 1.42*  --  1.29*  --   --   --   --   --   --   --   < > = values in this interval not displayed.  Estimated Creatinine Clearance: 46.5 mL/min (A) (by C-G formula based on SCr of 1.18 mg/dL (H)).  Assessment: Heparin for afib + ACS while awaiting CABG on 5/3. Using aPTT to dose heparin for now given Xarelto influence on anti-Xa levels (last dose 4/25).  APTT remains at goal at 86, HL elevated at 0.92 (not yet correlating).  Hgb remains low but stable, plt wnl and stable. No reported significant s/s bleeding.   Goal of Therapy:  Heparin level 0.3-0.7 units/ml aPTT 66-102 seconds Monitor platelets by anticoagulation protocol: Yes   Plan:  - Continue heparin gtt at 1250 units/hr - Daily aptt/HL until correlating, CBC  - F/u CABG on 5/3  Naketa Daddario K. Velva Harman, PharmD, BCPS, CPP Clinical Pharmacist Pager: [phone] Phone: [phone] 11/30/2016 10:24 AM

## 2016-11-30 NOTE — Progress Notes (Signed)
Progress Note  Patient Name: [name] Date of Encounter: 11/30/2016  Primary Cardiologist: Dr. Haroldine Laws Primary Electrophysiologist: Dr. Lovena Le  Subjective   No dyspnea or chest pain  Reviewed echo from yesterday EF 30-35% no aneurysm noted mild to  Moderate MR and severe TR with AICD wire across TV   Inpatient Medications    Scheduled Meds: . atorvastatin  80 mg Oral q1800  . furosemide  40 mg Intravenous BID  . insulin aspart  0-15 Units Subcutaneous TID WC  . mexiletine  300 mg Oral Q8H  . senna-docusate  1 tablet Oral BID  . sodium chloride flush  3 mL Intravenous Q12H  . sodium chloride flush  3 mL Intravenous Q12H   Continuous Infusions: . sodium chloride    . sodium chloride    . heparin 1,250 Units/hr (11/29/16 1635)   PRN Meds: sodium chloride, sodium chloride, acetaminophen, acetaminophen, ALPRAZolam, alum & mag hydroxide-simeth, magnesium hydroxide, ondansetron (ZOFRAN) IV, oxyCODONE-acetaminophen, promethazine, sodium chloride flush, sodium chloride flush   Vital Signs    Vitals:   11/29/16 2053 11/29/16 2117 11/30/16 0456 11/30/16 0736  BP:   118/67 (!) 117/54  Pulse: 71 67 66 75  Resp: (!) 24 (!) 21 20 18   Temp:   99.1 F (37.3 C) 98.7 F (37.1 C)  TempSrc:    Oral  SpO2: 99% 97% 97% 97%  Weight:   174 lb 8 oz (79.2 kg)   Height:        Intake/Output Summary (Last 24 hours) at 11/30/16 0831 Last data filed at 11/30/16 3299  Gross per 24 hour  Intake           1027.5 ml  Output             1150 ml  Net           -122.5 ml   Filed Weights   11/28/16 0600 11/29/16 0423 11/30/16 0456  Weight: 175 lb (79.4 kg) 176 lb 12.8 oz (80.2 kg) 174 lb 8 oz (79.2 kg)    Telemetry    2 episode of wide complex tachycardia with HR 120 around 11:30pm and 11:58pm last night. - Personally Reviewed  ECG    Wide complex tachycardia, VT vs afib abberancy - Personally Reviewed  Physical Exam   Affect appropriate Healthy:  appears stated  age 76: normal Neck supple with no adenopathy JVP normal no bruits no thyromegaly Lungs clear with no wheezing and good diaphragmatic motion Heart:  S1/S2 no murmur, no rub, gallop or click PMI normal Abdomen: benighn, BS positve, no tenderness, no AAA no bruit.  No HSM or HJR Distal pulses intact with no bruits No edema Neuro non-focal Skin warm and dry No muscular weakness   Labs    Chemistry  Recent Labs Lab 11/27/16 0159 11/28/16 0542 11/29/16 0018 11/30/16 0445  NA 137 138 139 139  K 3.9 4.0 4.1 3.9  CL 106 107 105 106  CO2 21* 22 22 25   GLUCOSE 169* 159* 151* 90  BUN 37* 36* 34* 31*  CREATININE 1.17* 1.19* 1.16* 1.18*  CALCIUM 8.9 8.6* 8.7* 8.7*  PROT 6.9  --   --   --   ALBUMIN 3.5  --   --   --   AST 16  --   --   --   ALT 14  --   --   --   ALKPHOS 96  --   --   --   BILITOT 0.9  --   --   --  GFRNONAA 47* 46* 47* 46*  GFRAA 54* 53* 55* 54*  ANIONGAP 10 9 12 8      Hematology  Recent Labs Lab 11/28/16 0741 11/29/16 0018 11/30/16 0445  WBC 7.9 7.1 9.8  RBC 3.37* 3.36* 3.29*  HGB 9.2* 9.4* 8.9*  HCT 29.4* 29.6* 29.3*  MCV 87.2 88.1 89.1  MCH 27.3 28.0 27.1  MCHC 31.3 31.8 30.4  RDW 15.3 15.9* 15.6*  PLT 319 314 321    Cardiac Enzymes  Recent Labs Lab 11/27/16 0159 11/27/16 1404 11/27/16 1924 11/28/16 0108  TROPONINI 1.56* 1.92* 1.42* 1.29*   No results for input(s): TROPIPOC in the last 168 hours.   BNP  Recent Labs Lab 11/27/16 0159  BNP 920.0*     DDimer No results for input(s): DDIMER in the last 168 hours.   Radiology    Dg Orthopantogram  Result Date: 11/29/2016 CLINICAL DATA:  Dental carries; CHF; pt denies any dental pain a EXAM: ORTHOPANTOGRAM/PANORAMIC COMPARISON:  CT, 11/27/2016 FINDINGS: No fracture.  No bone lesion. There multiple missing teeth. There are significant curies resorbing the crown of the single remaining left maxillary molar, #15. There is resorption of the crown also consistent with a carie of  the left mandibular lateral incisor, #23, and the right mandibular Cuspid, #27. No significant periapical lucency. IMPRESSION: 1. No fracture or bone lesion. 2. Multiple missing teeth. Dental caries involving 3 of the remaining teeth as detailed above. Electronically Signed   By: Lajean Manes M.D.   On: 11/29/2016 17:12   Dg Chest Port 1 View  Result Date: 11/29/2016 CLINICAL DATA:  71 year old female with history of nausea, congestive heart failure EXAM: PORTABLE CHEST 1 VIEW COMPARISON:  11/27/2016, 05/19/2016 FINDINGS: Cardiomediastinal silhouette unchanged with cardiomegaly. Unchanged cardiac pacing device on the left chest wall. Diffuse interlobular septal thickening.  No pneumothorax. Fullness in the central vasculature. No confluent airspace disease. No displaced fracture. IMPRESSION: Plain film evidence of congestive heart failure. Unchanged cardiac pacing device. Electronically Signed   By: Corrie Mckusick D.O.   On: 11/29/2016 13:17    Cardiac Studies   Cath 11/28/2016 Conclusion    Severe three-vessel native coronary disease with chronic total occlusion of the RCA supplied by left-to-right collaterals. Inferior wall branches are graftable.  Total occlusion of the mid circumflex with retrograde filling of 2 large obtuse marginal branches. Branches appear graftable.  Severe LAD and diagonal disease with subtotal occlusion in the mid vessel of the LAD. 90% stenosis in the proximal portion of large diagonal.  Elevated left ventricular end-diastolic pressure during tachycardia.  Sustained, hemodynamically stable ventricular tachycardia during the procedure that required pace termination via the AICD.  RECOMMENDATIONS:   Assessment of LV function by CT and/or echo with specific attention to the presence absence of an LV aneurysm involving the inferior wall.  TCTS notified of need for CABG and ? Aneurysm resection if present.     Patient Profile     71 y.o. female  with past  medical history of CAD (3-vessel CAD by cath in 04/2015 --> CABG consideration in the future), PAF (on Xarelto), Type 2 DM, VT (s/p placement of St. Jude ICD in 04/2015), chronic diastolic CHF, orthostatic hypotension and moderate pulmonary HTN who presents to [facility] ED on 11/27/2016 following a syncopal episode secondary to VFib requiring ICD firing. Cath showed 3v CAD, undergoing TCTS evaluation.  Assessment & Plan    1. NSTEMI with 3V CAD  - known 3v disease since 04/2015 on cath, CABG recommended, but  she declined.   - cath 11/28/2016 showed 90% prox diag, occluded RCA and LCx, severe LAD disease with occlusion of mid LAD.   - CTCS consulted, tentatively plan for CABG on 12/04/2016 for CABG with MAZE and atrial appendage clip No aneurysm on TTE however she has severe pulmonary HTN with AICD lead across TV not clear that this can be revised May need nitro / revatio post op   2. Syncope 2/2 ventricular fibrillation  - s/p placement of St. Jude ICD in 04/2015. Interrogation after syncope showed 6 episodes of vfin  - seen by EP 11/27/2016, started on mexiletine. Suspect triggered by ischemia  - 2 more episode of wide complex tachycardia around 11:30 pm and 11:58 pm last night. See EKG in chart. Continue Mexiletine.   3. PAF  This patients CHA2DS2-VASc Score and unadjusted Ischemic Stroke Rate (% per year) is equal to 7.2 % stroke rate/year from a score of 5  Above score calculated as 1 point each if present [CHF, HTN, DM, Vascular=MI/PAD/Aortic Plaque, Age if 65-74, or Female] Above score calculated as 2 points each if present [Age > 75, or Stroke/TIA/TE]  - Xarelto held. On IV heparin   Jenkins Rouge

## 2016-12-01 ENCOUNTER — Other Ambulatory Visit (HOSPITAL_COMMUNITY): Payer: PPO

## 2016-12-01 ENCOUNTER — Encounter (HOSPITAL_COMMUNITY): Payer: Self-pay | Admitting: Dentistry

## 2016-12-01 ENCOUNTER — Inpatient Hospital Stay (HOSPITAL_COMMUNITY): Payer: PPO

## 2016-12-01 DIAGNOSIS — Z01810 Encounter for preprocedural cardiovascular examination: Secondary | ICD-10-CM

## 2016-12-01 DIAGNOSIS — I4891 Unspecified atrial fibrillation: Secondary | ICD-10-CM

## 2016-12-01 DIAGNOSIS — I059 Rheumatic mitral valve disease, unspecified: Secondary | ICD-10-CM

## 2016-12-01 LAB — BASIC METABOLIC PANEL
Anion gap: 8 (ref 5–15)
BUN: 31 mg/dL — AB (ref 6–20)
CO2: 27 mmol/L (ref 22–32)
CREATININE: 1.25 mg/dL — AB (ref 0.44–1.00)
Calcium: 9.3 mg/dL (ref 8.9–10.3)
Chloride: 105 mmol/L (ref 101–111)
GFR calc Af Amer: 50 mL/min — ABNORMAL LOW (ref >60)
GFR, EST NON AFRICAN AMERICAN: 43 mL/min — AB (ref >60)
GLUCOSE: 128 mg/dL — AB (ref 65–99)
Potassium: 4.6 mmol/L (ref 3.5–5.1)
Sodium: 140 mmol/L (ref 135–145)

## 2016-12-01 LAB — PULMONARY FUNCTION TEST
FEF 25-75 PRE: 0.92 L/s
FEF 25-75 Post: 1.88 L/sec
FEF2575-%Change-Post: 103 %
FEF2575-%PRED-PRE: 47 %
FEF2575-%Pred-Post: 96 %
FEV1-%Change-Post: 17 %
FEV1-%PRED-POST: 61 %
FEV1-%PRED-PRE: 52 %
FEV1-PRE: 1.21 L
FEV1-Post: 1.42 L
FEV1FVC-%Change-Post: 9 %
FEV1FVC-%Pred-Pre: 100 %
FEV6-%CHANGE-POST: 7 %
FEV6-%PRED-POST: 58 %
FEV6-%PRED-PRE: 54 %
FEV6-PRE: 1.58 L
FEV6-Post: 1.69 L
FEV6FVC-%PRED-POST: 104 %
FEV6FVC-%PRED-PRE: 104 %
FVC-%Change-Post: 7 %
FVC-%Pred-Post: 55 %
FVC-%Pred-Pre: 52 %
FVC-Post: 1.69 L
FVC-Pre: 1.58 L
POST FEV6/FVC RATIO: 100 %
PRE FEV1/FVC RATIO: 76 %
Post FEV1/FVC ratio: 84 %
Pre FEV6/FVC Ratio: 100 %

## 2016-12-01 LAB — CBC
HCT: 28.2 % — ABNORMAL LOW (ref 36.0–46.0)
HEMOGLOBIN: 8.9 g/dL — AB (ref 12.0–15.0)
MCH: 28.1 pg (ref 26.0–34.0)
MCHC: 31.6 g/dL (ref 30.0–36.0)
MCV: 89 fL (ref 78.0–100.0)
Platelets: 302 10*3/uL (ref 150–400)
RBC: 3.17 MIL/uL — ABNORMAL LOW (ref 3.87–5.11)
RDW: 15.8 % — AB (ref 11.5–15.5)
WBC: 8.1 10*3/uL (ref 4.0–10.5)

## 2016-12-01 LAB — GLUCOSE, CAPILLARY
GLUCOSE-CAPILLARY: 192 mg/dL — AB (ref 65–99)
Glucose-Capillary: 109 mg/dL — ABNORMAL HIGH (ref 65–99)
Glucose-Capillary: 182 mg/dL — ABNORMAL HIGH (ref 65–99)
Glucose-Capillary: 155 mg/dL — ABNORMAL HIGH (ref 65–99)

## 2016-12-01 LAB — HEPARIN LEVEL (UNFRACTIONATED): HEPARIN UNFRACTIONATED: 0.59 [IU]/mL (ref 0.30–0.70)

## 2016-12-01 LAB — APTT: aPTT: 74 seconds — ABNORMAL HIGH (ref 24–36)

## 2016-12-01 MED ORDER — ALBUTEROL SULFATE (2.5 MG/3ML) 0.083% IN NEBU
2.5000 mg | INHALATION_SOLUTION | Freq: Once | RESPIRATORY_TRACT | Status: AC
  Administered 2016-12-01: 2.5 mg via RESPIRATORY_TRACT

## 2016-12-01 NOTE — Progress Notes (Signed)
ANTICOAGULATION CONSULT NOTE - Follow Up Consult  Pharmacy Consult for Heparin (Xarelto on hold) Indication: chest pain/ACS and atrial fibrillation, plan for CABG 5/3  No Known Allergies  Patient Measurements: Height: 5\' 4"  (162.6 cm) Weight: 171 lb 1.6 oz (77.6 kg) IBW/kg (Calculated) : 54.7  Vital Signs: Temp: 97.6 F (36.4 C) (04/30 0809) Temp Source: Axillary (04/30 0809) BP: 132/61 (04/30 0809) Pulse Rate: 67 (04/30 0809)  Labs:  Recent Labs  11/29/16 0018  11/29/16 1612 11/30/16 0445 12/01/16 0304  HGB 9.4*  --   --  8.9* 8.9*  HCT 29.6*  --   --  29.3* 28.2*  PLT 314  --   --  321 302  APTT 61*  < > 77* 86* 74*  HEPARINUNFRC 1.12*  --   --  0.92* 0.59  CREATININE 1.16*  --   --  1.18* 1.25*  < > = values in this interval not displayed.  Estimated Creatinine Clearance: 43.5 mL/min (A) (by C-G formula based on SCr of 1.25 mg/dL (H)).  Assessment: Heparin for afib + ACS while awaiting CABG on 5/3. Using aPTT to dose heparin for now given Xarelto influence on anti-Xa levels (last dose 4/25).  APTT remains at goal at 74, HL now starting to correlate at 0.59. Will use heparin level moving forward. Hgb remains low but stable, plt wnl. No issues with infusion or bleeding noted  Goal of Therapy:  Heparin level 0.3-0.7 units/ml aPTT 66-102 seconds Monitor platelets by anticoagulation protocol: Yes   Plan:  Continue heparin 1250 units/hr Daily HL/CBC Monitor s/sx of bleeding  F/u CABG on 5/3  Breigh Annett M. Diona Foley, PharmD, BCPS Clinical Pharmacist [phone] 12/01/2016 8:58 AM

## 2016-12-01 NOTE — Progress Notes (Addendum)
Pt had frequent runs of VTach. Denies chest pain, but is having fluttering feeling, HR 103-105. EKG done. On-call cards fellow notified.  HR changed back to 75-76 during EKG being performed.

## 2016-12-01 NOTE — Consult Note (Signed)
DENTAL CONSULTATION  Date of Consultation:  12/01/2016 Patient Name:   [name] Date of Birth:   [birth year] Medical Record Number: [id number]  VITALS: BP 132/61 (BP Location: Right Arm)   Pulse 67   Temp 97.6 F (36.4 C) (Axillary)   Resp 17   Ht 5\' 4"  (1.626 m)   Wt 171 lb 1.6 oz (77.6 kg)   SpO2 98%   BMI 29.37 kg/m   CHIEF COMPLAINT: Patient referred by Dr. Roxan Hockey for dental consultation.   HPI: [name] is a 71 year old female recently diagnosed with coronary artery disease and heart valve disease with anticipated coronary artery bypass graft procedure and possible heart valve repair. Patient was referred for evaluation of poor dentition. Patient is now seen as part of a medically necessary pre-heart valve surgery dental protocol examination.  The patient currently denies acute toothaches, swellings, or abscesses. Patient was last seen 7 years ago for multiple extractions and bone grafting with an oral surgeon, Dr. Frederik Schmidt. Patient had been planned for follow-up implant therapy with Dr. Charolotte Capuchin but the patient did not do so. Patient denies having any partial dentures. 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  PROBLEM LIST: Patient Active Problem List   Diagnosis Date Noted  . NSTEMI (non-ST elevated myocardial infarction) ([place]) 11/27/2016  . Acute on chronic diastolic (congestive) heart failure ([place]) 11/27/2016  . Ventricular fibrillation ([place]) 11/27/2016  . Head injury   . ICD (implantable cardioverter-defibrillator) discharge   . Diabetic neuropathy ([place]) 06/16/2016  . Chronic kidney disease 06/16/2016  . Fatigue 10/30/2015  . Afib ([place]) 10/02/2015  . Chronic combined systolic and diastolic heart failure ([place]) 10/02/2015  . Atrial flutter ([place]) 09/18/2015  . Chronic anticoagulation-Xarelto 09/18/2015  . Acute CHF (congestive heart failure) ([place]) 09/18/2015  . Acute diastolic heart failure ([place]) 09/17/2015  . Orthostatic  hypotension   . ICD in place- St Jude   . HLD (hyperlipidemia) 05/11/2015  . Syncope 05/11/2015  . Abdominal pain 05/11/2015  . AKI (acute kidney injury) ([place])   . Nausea & vomiting   . Hypothyroidism 04/27/2015  . Chronic diastolic CHF (congestive heart failure) ([place])   . Abnormal PFT   . CAD (coronary artery disease), native coronary artery - 3 vessel 04/21/2015  . Acute respiratory failure ([place])   . Ventricular tachycardia- Sept 2016- ICD 04/13/2015  . Acute on chronic diastolic ACC/AHA stage C congestive heart failure ([place])   . UTI (lower urinary tract infection) 03/31/2015  . Sepsis ([place]) 03/31/2015  . Atrial fibrillation with rapid ventricular response ([place]) 03/31/2015  . Congestive heart disease ([place])   . Hypertrophic cardiomyopathy ([place])   . Septic bursitis of elbow 02/03/2013  . Acute blood loss anemia 01/08/2013    Class: Acute  . Diabetes mellitus type 2, noninsulin dependent ([place])   . Cellulitis 12/31/2012  . Hyponatremia 12/31/2012  . Hypokalemia 12/31/2012  . Fasting hyperglycemia 12/31/2012  . Leukocytosis 12/31/2012  . Hypotension 12/31/2012    PMH: Past Medical History:  Diagnosis Date  . Abnormal PFT   . CAD (coronary artery disease), native coronary artery - 3 vessel 04/21/2015   a. NSTEMI 8-04/2015 felt 2/2 demand ischemia. b. 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  . Chronic diastolic CHF (congestive heart failure) ([place])   . Diabetes mellitus ([place])   . Diabetes mellitus without complication ([place])    dx on wed  . Hypertrophic cardiomyopathy ([place])   . Left atrial  enlargement   . Mitral regurgitation    a. Echo 04/2015: moderate mitral regurgitation. b. F/u echo 05/2015: mild MR.  . NSTEMI (non-ST elevated myocardial infarction) ([place]) 11/27/2016  . Paroxysmal atrial fibrillation ([name])   . Shortness of breath dyspnea   . Tricuspid regurgitation    a. Echo 04/2015: Mod-severe TR. b. Not mentioned on echo  05/2015  . Ventricular tachycardia ([place])    a. s/p St. Jude ICD implanted 04/2015.    PSH: Past Surgical History:  Procedure Laterality Date  . APPLICATION OF WOUND VAC Left 01/06/2013   Procedure: APPLICATION OF WOUND VAC X 2;  Surgeon: [name], MD;  Location: [facility];  Service: Orthopedics;  Laterality: Left;  left forearm  . APPLICATION OF WOUND VAC Left 01/09/2013   Procedure: APPLICATION OF WOUND VAC;  Surgeon: [name], MD;  Location: [facility];  Service: Orthopedics;  Laterality: Left;  . CARDIAC CATHETERIZATION N/A 04/19/2015   Procedure: Left Heart Cath and Coronary Angiography;  Surgeon: [name], MD;  Location: [place] [facility];  Service: Cardiovascular;  Laterality: N/A;  . CARDIOVERSION N/A 09/20/2015   Procedure: CARDIOVERSION;  Surgeon: Pixie Casino, MD;  Location: [facility] ENDOSCOPY;  Service: Cardiovascular;  Laterality: N/A;  . DENTAL SURGERY  Oct. 2011   several extractions, bone graft  . EP IMPLANTABLE DEVICE N/A 04/16/2015   Procedure: ICD Implant;  Surgeon: [name], MD;  Location: [place] CV LAB;  Service: Cardiovascular;  Laterality: N/A;  . I&D EXTREMITY Left 01/06/2013   Procedure: IRRIGATION AND DEBRIDEMENT LEFT ELBOW AND LEFT FOREARM ;  Surgeon: [name], MD;  Location: [facility];  Service: Orthopedics;  Laterality: Left;  . INCISION AND DRAINAGE Left 01/09/2013   Procedure: REDO INCISION AND DRAINAGE LEFT ELBOW;  Surgeon: [name], MD;  Location: [facility];  Service: Orthopedics;  Laterality: Left;  SUPINE, UPPER EXTERMITY DRAPE  . LEFT HEART CATH AND CORONARY ANGIOGRAPHY N/A 11/28/2016   Procedure: Left Heart Cath and Coronary Angiography;  Surgeon: Belva Crome, MD;  Location: [place] [facility];  Service: Cardiovascular;  Laterality: N/A;  . SECONDARY CLOSURE OF WOUND Left 01/09/2013   Procedure: SECONDARY CLOSURE OF WOUND  LEFT ELBOW;  Surgeon: [name], MD;  Location: [facility];  Service: Orthopedics;  Laterality: Left;  . TEE  WITHOUT CARDIOVERSION N/A 09/20/2015   Procedure: TRANSESOPHAGEAL ECHOCARDIOGRAM (TEE);  Surgeon: Pixie Casino, MD;  Location[facility];  Service: Cardiovascular;  Laterality: N/A;    ALLERGIES: No Known Allergies  MEDICATIONS: Current Facility-Administered Medications  Medication Dose Route Frequency Provider Last Rate Last Dose  . 0.9 %  sodium chloride infusion  250 mL Intravenous PRN [place] M Strader, PA-C      . 0.9 %  sodium chloride infusion  250 mL Intravenous PRN Belva Crome, MD      . acetaminophen (TYLENOL) tablet 650 mg  650 mg Oral Q4H PRN Erma Heritage, PA-C   650 mg at 11/28/16 0617  . acetaminophen (TYLENOL) tablet 650 mg  650 mg Oral Q4H PRN Belva Crome, MD      . ALPRAZolam Duanne Moron) tablet 0.25 mg  0.25 mg Oral TID PRN Sueanne Margarita, MD      . alum & mag hydroxide-simeth (MAALOX/MYLANTA) 200-200-20 MG/5ML suspension 30 mL  30 mL Oral Q2H PRN Belva Crome, MD   30 mL at 11/30/16 0844  . atorvastatin (LIPITOR) tablet 80 mg  80 mg Oral q1800  Almyra Deforest, [place]   80 mg at 11/30/16 1749  . furosemide (LASIX) injection 40 mg  40 mg Intravenous BID Pixie Casino, MD   40 mg at 11/30/16 1749  . heparin ADULT infusion 100 units/mL (25000 units/267mL sodium chloride 0.45%)  1,250 Units/hr Intravenous Continuous Erenest Blank, RPH 12.5 mL/hr at 11/30/16 1252 1,250 Units/hr at 11/30/16 1252  . insulin aspart (novoLOG) injection 0-15 Units  0-15 Units Subcutaneous TID WC Erma Heritage, PA-C   2 Units at 11/30/16 1749  . magnesium hydroxide (MILK OF MAGNESIA) suspension 30 mL  30 mL Oral Daily PRN Belva Crome, MD      . mexiletine (MEXITIL) capsule 300 mg  300 mg Oral Q8H Renee Dyane Dustman, PA-C   300 mg at 12/01/16 0636  . ondansetron (ZOFRAN) injection 4 mg  4 mg Intravenous Q6H PRN Belva Crome, MD   4 mg at 11/29/16 0855  . oxyCODONE-acetaminophen (PERCOCET/ROXICET) 5-325 MG per tablet 1-2 tablet  1-2 tablet Oral Q4H PRN Belva Crome, MD      . promethazine  [facility]) injection 12.5 mg  12.5 mg Intravenous Q6H PRN Almyra Deforest, PA      . senna-docusate (Senokot-S) tablet 1 tablet  1 tablet Oral BID Erma Heritage, PA-C   1 tablet at 11/30/16 0841  . sodium chloride flush (NS) 0.9 % injection 3 mL  3 mL Intravenous Q12H Erma Heritage, PA-C   3 mL at 11/30/16 0845  . sodium chloride flush (NS) 0.9 % injection 3 mL  3 mL Intravenous PRN [place] M Strader, PA-C      . sodium chloride flush (NS) 0.9 % injection 3 mL  3 mL Intravenous Q12H Belva Crome, MD   3 mL at 11/30/16 0845  . sodium chloride flush (NS) 0.9 % injection 3 mL  3 mL Intravenous PRN Belva Crome, MD        LABS: Lab Results  Component Value Date   WBC 8.1 12/01/2016   HGB 8.9 (L) 12/01/2016   HCT 28.2 (L) 12/01/2016   MCV 89.0 12/01/2016   PLT 302 12/01/2016      Component Value Date/Time   NA 140 12/01/2016 0304   K 4.6 12/01/2016 0304   CL 105 12/01/2016 0304   CO2 27 12/01/2016 0304   GLUCOSE 128 (H) 12/01/2016 0304   BUN 31 (H) 12/01/2016 0304   CREATININE 1.25 (H) 12/01/2016 0304   CREATININE 1.29 (H) 06/16/2016 1228   CALCIUM 9.3 12/01/2016 0304   GFRNONAA 43 (L) 12/01/2016 0304   GFRNONAA 43 (L) 06/16/2016 1228   GFRAA 50 (L) 12/01/2016 0304   GFRAA 49 (L) 06/16/2016 1228   Lab Results  Component Value Date   INR 2.59 (H) 09/17/2015   INR 1.28 05/12/2015   INR 1.24 03/31/2015   No results found for: PTT  SOCIAL HISTORY: Social History   Social History  . Marital status: Divorced    Spouse name: N/A  . Number of children: N/A  . Years of education: N/A   Occupational History  . Not on file.   Social History Main Topics  . Smoking status: Former Smoker    Years: 4.00    Quit date: 12/31/1980  . Smokeless tobacco: Never Used  . Alcohol use No  . Drug use: No  . Sexual activity: Not Currently   Other Topics Concern  . Not on file   Social History Narrative   Divorced.  Lives with daughter  and grandson. 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    FAMILY HISTORY: Family History  Problem Relation Age of Onset  . Breast cancer Mother   . Diabetes Father   . Stroke Father   . Heart disease Sister   . Healthy Sister     REVIEW OF SYSTEMS: Reviewed With the patient as per history of present illness.  Psych: Patient does have some dental phobia.   DENTAL HISTORY: CHIEF COMPLAINT: Patient referred by Dr. Roxan Hockey for dental consultation.   HPI: [name] is a 71 year old female recently diagnosed with coronary artery disease and heart valve disease with anticipated coronary artery bypass graft procedure and possible heart valve repair. Patient was referred for evaluation of poor dentition. Patient is now seen as part of a medically necessary pre-heart valve surgery dental protocol examination.  The patient currently denies acute toothaches, swellings, or abscesses. Patient was last seen 7 years ago for multiple extractions and bone grafting with an oral surgeon, Dr. Frederik Schmidt. Patient had been planned for follow-up implant therapy with Dr. Charolotte Capuchin but the patient did not do so. Patient denies having any partial dentures. 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  DENTAL EXAMINATION: GENERAL: The patient is a well-developed, well-nourished female in no acute distress. HEAD AND NECK: There is no palpable neck lymphadenopathy. The patient denies acute TMJ symptoms. INTRAORAL EXAM: Patient has normal saliva. There is no evidence of oral abscess formation. Patient has atrophy of the edentulous alveolar ridges. DENTITION: The patient has multiple missing teeth and retained root segments in the area of tooth numbers 15 and 28. PERIODONTAL: The patient has chronic periodontitis with plaque and calculus accumulations, gingival recession, and incipient tooth mobility. There is incipient to moderate bone loss. DENTAL CARIES/SUBOPTIMAL RESTORATIONS: Patient has multiple dental caries affecting  multiple teeth. ENDODONTIC: Patient currently denies acute pulpitis symptoms. There is no evidence of oral abscess formation. The patient does have periapical pathology associated with the retained root segments in the area of #15. Patient has had previous root canal therapy associated with tooth #31. CROWN AND BRIDGE: Patient has porcelain crown restorations involving tooth numbers 7, 8, and 9 and has a Full gold crown on molar #31. PROSTHODONTIC: Patient denies having partial dentures.  OCCLUSION: Patient has a poor occlusal scheme secondary to multiple missing teeth, retained root segments, and lack of replacement of all missing teeth with clinically acceptable dental restorations.  RADIOGRAPHIC INTERPRETATION: An orthopantogram taken on 11/29/2016 by department of radiology. There are multiple missing teeth. There are retained root segments in the area tooth numbers 15 and 28. There are dental caries noted. There is incipient to moderate bone loss noted. There is a previous root canal therapy associated with tooth #31. She has a crown on tooth #31. There is periapical pathology associated with retained roots in the area of #15.  ASSESSMENTS: 1. Coronary artery disease with anticipated coronary artery bypass graft procedure 2. Heart valve disease with possible heart valve repair at time of CABG 3. Heparin therapy with risk for bleeding with invasive dental procedures 4. Chronic apical periodontitis 5. Multiple dental caries 6. Retained root segments 7. Chronic periodontitis with bone loss 8. Gingival recession 9. Accretions 10. Incipient tooth mobility 11. Multiple missing teeth 12. No history of partial dentures 13. Atrophy of edentulous alveolar ridges 14. Malocclusion 15. Risk for complications with anticipated dental procedures in the operating room with general anesthesia up to and including death due to overall cardiovascular compromise.  PLAN/RECOMMENDATIONS: 1. I discussed  the risks, benefits, and complications of various treatment options with the patient in relationship to her  medical and dental conditions, anticipated Coronary artery bypass graft surgery and possible heart valve surgery, and risk for endocarditis. We discussed various treatment options to include no treatment, total and subtotal extractions with alveoloplasty, pre-prosthetic surgery as indicated, periodontal therapy, dental restorations, root canal therapy, crown and bridge therapy, implant therapy, and replacement of missing teeth as indicated. We also discussed the risks for complications to include bleeding, swelling, infection, nerve damage, soft tissue damage, damage to adjacent teeth, root tip fracture, mandible fracture, sinus involvement, and complications up to and including death. We also discussed the timing of dental extraction procedures to be ideally scheduled before the heart surgery or possibly after the heart surgery once she is medically stable from that procedure if she is willing to accept the potential risk for infection from the retained root segments. The patient currently wishes to proceed with her heart surgery this coming Thursday and will follow-up with dental treatment once she is medically stable from the anticipated heart surgery and possible heart valve surgery.  I did contact Dr. Roxan Hockey with my dental findings and he will proceed with heart surgery this coming Thursday.   2. Discussion of findings with medical team and coordination of future medical and dental care as needed.    Lenn Cal, DDS

## 2016-12-01 NOTE — Care Management Important Message (Signed)
Important Message  Patient Details  Name: [name] MRN: [id number] Date of Birth: 09-Mar-1946   Medicare Important Message Given:  Yes    Nathen May 12/01/2016, 11:35 PM

## 2016-12-01 NOTE — Progress Notes (Signed)
CARDIAC REHAB PHASE I   MD to advise if pt appropriate for ambulation prior to surgery. Cardiac surgery pre-op education completed with pt at bedside. Reviewed IS, activity progression, sternal precautions, cardiac surgery booklet and cardiac surgery guidelines. Pt verbalized understanding, anxious, tearful at times. Emotional support given to pt. Pt declined cardiac surgery videos. Pt sitting on edge of bed, call bell within reach. Will follow.  [id number] Lenna Sciara, RN, BSN 12/01/2016 10:48 AM

## 2016-12-01 NOTE — Progress Notes (Signed)
Progress Note  Patient Name: [name] Date of Encounter: 12/01/2016  Primary Cardiologist: Dr. Haroldine Laws Primary Electrophysiologist: Dr. Lovena Le  Subjective   Feeling well. No chest pain, sob or palpitations. Surgery 12/04/16.   Inpatient Medications    Scheduled Meds: . atorvastatin  80 mg Oral q1800  . furosemide  40 mg Intravenous BID  . insulin aspart  0-15 Units Subcutaneous TID WC  . mexiletine  300 mg Oral Q8H  . senna-docusate  1 tablet Oral BID  . sodium chloride flush  3 mL Intravenous Q12H  . sodium chloride flush  3 mL Intravenous Q12H   Continuous Infusions: . sodium chloride    . sodium chloride    . heparin 1,250 Units/hr (11/30/16 1252)   PRN Meds: sodium chloride, sodium chloride, acetaminophen, acetaminophen, ALPRAZolam, alum & mag hydroxide-simeth, magnesium hydroxide, ondansetron (ZOFRAN) IV, oxyCODONE-acetaminophen, promethazine, sodium chloride flush, sodium chloride flush   Vital Signs    Vitals:   11/30/16 1700 11/30/16 1937 12/01/16 0005 12/01/16 0327  BP: (!) 123/53 (!) 127/49 130/60 132/61  Pulse: 79  70 65  Resp: 16 16 17 17   Temp: 97.9 F (36.6 C) 98.5 F (36.9 C) 98.9 F (37.2 C) 97.8 F (36.6 C)  TempSrc: Oral Oral    SpO2: 95% 98% 92% 91%  Weight:    171 lb 1.6 oz (77.6 kg)  Height:        Intake/Output Summary (Last 24 hours) at 12/01/16 0806 Last data filed at 12/01/16 0600  Gross per 24 hour  Intake           442.29 ml  Output             2000 ml  Net         -1557.71 ml   Filed Weights   11/29/16 0423 11/30/16 0456 12/01/16 0327  Weight: 176 lb 12.8 oz (80.2 kg) 174 lb 8 oz (79.2 kg) 171 lb 1.6 oz (77.6 kg)    Telemetry   NSR with PVcs- Personally Reviewed  ECG    None today   Physical Exam   GEN: No acute distress.   Neck: No JVD Cardiac: RRR, +  murmurs, rubs, or gallops.  Respiratory: Clear to auscultation bilaterally. GI: Soft, nontender, non-distended  MS: No edema; No deformity. Neuro:   Nonfocal  Psych: Normal affect   Labs    Chemistry Recent Labs Lab 11/27/16 0159  11/29/16 0018 11/30/16 0445 12/01/16 0304  NA 137  < > 139 139 140  K 3.9  < > 4.1 3.9 4.6  CL 106  < > 105 106 105  CO2 21*  < > 22 25 27   GLUCOSE 169*  < > 151* 90 128*  BUN 37*  < > 34* 31* 31*  CREATININE 1.17*  < > 1.16* 1.18* 1.25*  CALCIUM 8.9  < > 8.7* 8.7* 9.3  PROT 6.9  --   --   --   --   ALBUMIN 3.5  --   --   --   --   AST 16  --   --   --   --   ALT 14  --   --   --   --   ALKPHOS 96  --   --   --   --   BILITOT 0.9  --   --   --   --   GFRNONAA 47*  < > 47* 46* 43*  GFRAA 54*  < > 55* 54*  50*  ANIONGAP 10  < > 12 8 8   < > = values in this interval not displayed.   Hematology Recent Labs Lab 11/29/16 0018 11/30/16 0445 12/01/16 0304  WBC 7.1 9.8 8.1  RBC 3.36* 3.29* 3.17*  HGB 9.4* 8.9* 8.9*  HCT 29.6* 29.3* 28.2*  MCV 88.1 89.1 89.0  MCH 28.0 27.1 28.1  MCHC 31.8 30.4 31.6  RDW 15.9* 15.6* 15.8*  PLT 314 321 302    Cardiac Enzymes Recent Labs Lab 11/27/16 0159 11/27/16 1404 11/27/16 1924 11/28/16 0108  TROPONINI 1.56* 1.92* 1.42* 1.29*   No results for input(s): TROPIPOC in the last 168 hours.   BNP Recent Labs Lab 11/27/16 0159  BNP 920.0*     DDimer No results for input(s): DDIMER in the last 168 hours.   Radiology    Dg Orthopantogram  Result Date: 11/29/2016 CLINICAL DATA:  Dental carries; CHF; pt denies any dental pain a EXAM: ORTHOPANTOGRAM/PANORAMIC COMPARISON:  CT, 11/27/2016 FINDINGS: No fracture.  No bone lesion. There multiple missing teeth. There are significant curies resorbing the crown of the single remaining left maxillary molar, #15. There is resorption of the crown also consistent with a carie of the left mandibular lateral incisor, #23, and the right mandibular Cuspid, #27. No significant periapical lucency. IMPRESSION: 1. No fracture or bone lesion. 2. Multiple missing teeth. Dental caries involving 3 of the remaining teeth as  detailed above. Electronically Signed   By: Lajean Manes M.D.   On: 11/29/2016 17:12   Dg Chest Port 1 View  Result Date: 11/29/2016 CLINICAL DATA:  71 year old female with history of nausea, congestive heart failure EXAM: PORTABLE CHEST 1 VIEW COMPARISON:  11/27/2016, 05/19/2016 FINDINGS: Cardiomediastinal silhouette unchanged with cardiomegaly. Unchanged cardiac pacing device on the left chest wall. Diffuse interlobular septal thickening.  No pneumothorax. Fullness in the central vasculature. No confluent airspace disease. No displaced fracture. IMPRESSION: Plain film evidence of congestive heart failure. Unchanged cardiac pacing device. Electronically Signed   By: Corrie Mckusick D.O.   On: 11/29/2016 13:17    Cardiac Studies   Cath 11/28/2016 Conclusion    Severe three-vessel native coronary disease with chronic total occlusion of the RCA supplied by left-to-right collaterals. Inferior wall branches are graftable.  Total occlusion of the mid circumflex with retrograde filling of 2 large obtuse marginal branches. Branches appear graftable.  Severe LAD and diagonal disease with subtotal occlusion in the mid vessel of the LAD. 90% stenosis in the proximal portion of large diagonal.  Elevated left ventricular end-diastolic pressure during tachycardia.  Sustained, hemodynamically stable ventricular tachycardia during the procedure that required pace termination via the AICD.  RECOMMENDATIONS:   Assessment of LV function by CT and/or echo with specific attention to the presence absence of an LV aneurysm involving the inferior wall.  TCTS notified of need for CABG and ? Aneurysm resection if present.   Echo 11/29/16 Study Conclusions  - Left ventricle: The cavity size was mildly dilated. Systolic   function was moderately reduced. The estimated ejection fraction   was in the range of 35% to 40%. There is akinesis of the basal   and mid inferior, inferolateral walls and apical  inferior wall.   Features are consistent with a pseudonormal left ventricular   filling pattern, with concomitant abnormal relaxation and   increased filling pressure (grade 2 diastolic dysfunction).   Doppler parameters are consistent with elevated ventricular   end-diastolic filling pressure. - Aortic valve: Trileaflet; normal thickness leaflets. There was no  regurgitation. - Aortic root: The aortic root was normal in size. - Mitral valve: There was moderate regurgitation. - Left atrium: The atrium was severely dilated. - Right atrium: The atrium was moderately dilated. Pacer wire or   catheter noted in right atrium. - Tricuspid valve: There was severe regurgitation. - Pulmonary arteries: Systolic pressure was severely increased. PA   peak pressure: 87 mm Hg (S). - Inferior vena cava: The vessel was dilated. The respirophasic   diameter changes were blunted (< 50%), consistent with elevated   central venous pressure. - Pericardium, extracardiac: There was no pericardial effusion.  Impressions:  - Since the prior study on 09/20/2015 LVEF still has moderate   systolic dysfunction at 67-54%.   There is new severe tricuspid regurgitation and severe pulmonary   hypertension with RVSP 87 mmHg.  Patient Profile   71 y.o. female with past medical history of CAD (3-vessel CAD by cath in 04/2015 -->CABG consideration in the future), PAF (on Xarelto), Type 2 DM, VT (s/p placement of St. Jude ICD in 04/2015), chronic diastolic CHF, orthostatic hypotension and moderate pulmonary HTN who presents to [facility] ED on 11/27/2016 following a syncopal episode secondary to VFib requiring ICD firing. Cath showed 3v CAD, undergoing TCTS evaluation. CABG 5/3 with other procedure.    Assessment & Plan    1. NSTEMI with 3V CAD  - known 3v disease since 04/2015 on cath, CABG recommended, but she declined.   - Peak of troponin 1.92.  cath 11/28/2016 showed 90% prox diag, occluded RCA and LCx, severe  LAD disease with occlusion of mid LAD.   - Seen by  CTCS  --> entatively plan for CABG on 12/04/2016 for CABG with MAZE and atrial appendage clip No aneurysm on TTE however she has severe pulmonary HTN and severe TR with AICD lead across - chest pain free   2. Syncope 2/2 ventricular fibrillation - s/p placement of St. Jude ICD in 04/2015. Interrogation after syncope showed 6 episodes of vfibrillation  - Seen by EP 11/27/2016, started on mexiletine. Suspect triggered by ischemia - 2 more episode of wide complex tachycardia. No event overnight. Electrolyte normal.  Continue Mexiletine.   3. PAF - Maintaining sinus rhythm. CHADSVASc score of 5. Continue heparin for anticoagulation.   4. Acute on chronic combined CHF - BNP 920 on admission. Net I & O negative 3.02L. She is euvolemic. Continue lasix 40mg  IV BID?  Will review with MD considering changing to PO. Scr gradually worsen to 1.25mg  today.   Signed, Leanor Kail, PA  12/01/2016, 8:06 AM

## 2016-12-02 ENCOUNTER — Inpatient Hospital Stay (HOSPITAL_COMMUNITY): Payer: PPO

## 2016-12-02 DIAGNOSIS — Z01810 Encounter for preprocedural cardiovascular examination: Secondary | ICD-10-CM

## 2016-12-02 LAB — CBC
HCT: 30.9 % — ABNORMAL LOW (ref 36.0–46.0)
Hemoglobin: 9.4 g/dL — ABNORMAL LOW (ref 12.0–15.0)
MCH: 27.1 pg (ref 26.0–34.0)
MCHC: 30.4 g/dL (ref 30.0–36.0)
MCV: 89 fL (ref 78.0–100.0)
PLATELETS: 322 10*3/uL (ref 150–400)
RBC: 3.47 MIL/uL — AB (ref 3.87–5.11)
RDW: 15.8 % — ABNORMAL HIGH (ref 11.5–15.5)
WBC: 8.9 10*3/uL (ref 4.0–10.5)

## 2016-12-02 LAB — VAS US DOPPLER PRE CABG
LCCADSYS: 82 cm/s
LCCAPDIAS: 17 cm/s
LEFT ECA DIAS: -9 cm/s
LICADSYS: -78 cm/s
LICAPSYS: -49 cm/s
Left CCA dist dias: 18 cm/s
Left CCA prox sys: 102 cm/s
Left ICA dist dias: -34 cm/s
Left ICA prox dias: -15 cm/s
RCCAPDIAS: 18 cm/s
RIGHT ECA DIAS: -7 cm/s
RIGHT VERTEBRAL DIAS: 17 cm/s
Right CCA prox sys: 78 cm/s
Right cca dist sys: -68 cm/s

## 2016-12-02 LAB — GLUCOSE, CAPILLARY
GLUCOSE-CAPILLARY: 205 mg/dL — AB (ref 65–99)
GLUCOSE-CAPILLARY: 121 mg/dL — AB (ref 65–99)
Glucose-Capillary: 122 mg/dL — ABNORMAL HIGH (ref 65–99)
Glucose-Capillary: 152 mg/dL — ABNORMAL HIGH (ref 65–99)

## 2016-12-02 LAB — BASIC METABOLIC PANEL
Anion gap: 8 (ref 5–15)
BUN: 23 mg/dL — ABNORMAL HIGH (ref 6–20)
CALCIUM: 8.9 mg/dL (ref 8.9–10.3)
CO2: 25 mmol/L (ref 22–32)
CREATININE: 1.08 mg/dL — AB (ref 0.44–1.00)
Chloride: 105 mmol/L (ref 101–111)
GFR, EST AFRICAN AMERICAN: 60 mL/min — AB (ref >60)
GFR, EST NON AFRICAN AMERICAN: 52 mL/min — AB (ref >60)
Glucose, Bld: 132 mg/dL — ABNORMAL HIGH (ref 65–99)
Potassium: 3.8 mmol/L (ref 3.5–5.1)
SODIUM: 138 mmol/L (ref 135–145)

## 2016-12-02 LAB — HEPARIN LEVEL (UNFRACTIONATED): HEPARIN UNFRACTIONATED: 0.53 [IU]/mL (ref 0.30–0.70)

## 2016-12-02 MED ORDER — METOPROLOL TARTRATE 25 MG PO TABS
25.0000 mg | ORAL_TABLET | Freq: Two times a day (BID) | ORAL | Status: DC
  Administered 2016-12-02 (×2): 25 mg via ORAL
  Filled 2016-12-02 (×2): qty 1

## 2016-12-02 NOTE — Progress Notes (Signed)
ANTICOAGULATION CONSULT NOTE - Follow Up Consult  Pharmacy Consult for Heparin (Xarelto on hold) Indication: chest pain/ACS and atrial fibrillation, plan for CABG 5/3  No Known Allergies  Patient Measurements: Height: 5\' 4"  (162.6 cm) Weight: 172 lb (78 kg) IBW/kg (Calculated) : 54.7  Vital Signs: Temp: 97.8 F (36.6 C) (05/01 0810) Temp Source: Oral (05/01 0810) BP: 156/79 (05/01 0810) Pulse Rate: 70 (05/01 0810)  Labs:  Recent Labs  11/29/16 1612  11/30/16 0445 12/01/16 0304 12/02/16 0315  HGB  --   < > 8.9* 8.9* 9.4*  HCT  --   --  29.3* 28.2* 30.9*  PLT  --   --  321 302 322  APTT 77*  --  86* 74*  --   HEPARINUNFRC  --   --  0.92* 0.59 0.53  CREATININE  --   --  1.18* 1.25* 1.08*  < > = values in this interval not displayed.  Estimated Creatinine Clearance: 50.4 mL/min (A) (by C-G formula based on SCr of 1.08 mg/dL (H)).  Assessment: Heparin for afib + ACS while awaiting CABG on 5/3. Using aPTT to dose heparin for now given Xarelto influence on anti-Xa levels (last dose 4/25).  HL remains therapeutic at 0.53 on heparin 1250 units/hr. Hgb remains low but stable, plt wnl. No issues with infusion or bleeding noted  Goal of Therapy:  Heparin level 0.3-0.7 units/ml Monitor platelets by anticoagulation protocol: Yes   Plan:  Continue heparin 1250 units/hr Daily HL/CBC Monitor s/sx of bleeding  F/u CABG on 5/3  Earlena Werst M. Diona Foley, PharmD, BCPS Clinical Pharmacist [phone] 12/02/2016 8:31 AM

## 2016-12-02 NOTE — Progress Notes (Signed)
Progress Note  Patient Name: [name] Date of Encounter: 12/02/2016  Primary Cardiologist: Dr. Haroldine Laws Primary Electrophysiologist: Dr. Lovena Le  Subjective   Feeling well. No chest pain, sob or palpitations. No complains. Walking in room without any issue.   Inpatient Medications    Scheduled Meds: . atorvastatin  80 mg Oral q1800  . furosemide  40 mg Intravenous BID  . insulin aspart  0-15 Units Subcutaneous TID WC  . mexiletine  300 mg Oral Q8H  . senna-docusate  1 tablet Oral BID  . sodium chloride flush  3 mL Intravenous Q12H  . sodium chloride flush  3 mL Intravenous Q12H   Continuous Infusions: . sodium chloride    . sodium chloride    . heparin 1,250 Units/hr (12/02/16 0348)   PRN Meds: sodium chloride, sodium chloride, acetaminophen, acetaminophen, ALPRAZolam, alum & mag hydroxide-simeth, magnesium hydroxide, ondansetron (ZOFRAN) IV, oxyCODONE-acetaminophen, promethazine, sodium chloride flush, sodium chloride flush   Vital Signs    Vitals:   12/01/16 2145 12/01/16 2200 12/02/16 0029 12/02/16 0521  BP:  (!) 144/86 (!) 122/96 133/66  Pulse:   78 65  Resp: 17 17  13   Temp:  97.8 F (36.6 C) 98.2 F (36.8 C) 97.7 F (36.5 C)  TempSrc:  Oral Oral Oral  SpO2:  98% 93% 94%  Weight:    172 lb (78 kg)  Height:        Intake/Output Summary (Last 24 hours) at 12/02/16 0725 Last data filed at 12/02/16 0631  Gross per 24 hour  Intake           656.25 ml  Output             1925 ml  Net         -1268.75 ml   Filed Weights   11/30/16 0456 12/01/16 0327 12/02/16 0521  Weight: 174 lb 8 oz (79.2 kg) 171 lb 1.6 oz (77.6 kg) 172 lb (78 kg)    Telemetry   NSR with PVcs- Personally Reviewed  ECG    None today   Physical Exam   GEN: No acute distress.   Neck: No JVD Cardiac: RRR, +  murmurs, rubs, or gallops.  Respiratory: Clear to auscultation bilaterally. GI: Soft, nontender, non-distended  MS: No edema; No deformity. Neuro:  Nonfocal  Psych:  Normal affect   Labs    Chemistry Recent Labs Lab 11/27/16 0159  11/30/16 0445 12/01/16 0304 12/02/16 0315  NA 137  < > 139 140 138  K 3.9  < > 3.9 4.6 3.8  CL 106  < > 106 105 105  CO2 21*  < > 25 27 25   GLUCOSE 169*  < > 90 128* 132*  BUN 37*  < > 31* 31* 23*  CREATININE 1.17*  < > 1.18* 1.25* 1.08*  CALCIUM 8.9  < > 8.7* 9.3 8.9  PROT 6.9  --   --   --   --   ALBUMIN 3.5  --   --   --   --   AST 16  --   --   --   --   ALT 14  --   --   --   --   ALKPHOS 96  --   --   --   --   BILITOT 0.9  --   --   --   --   GFRNONAA 47*  < > 46* 43* 52*  GFRAA 54*  < > 54* 50* 60*  ANIONGAP 10  < > 8 8 8   < > = values in this interval not displayed.   Hematology  Recent Labs Lab 11/30/16 0445 12/01/16 0304 12/02/16 0315  WBC 9.8 8.1 8.9  RBC 3.29* 3.17* 3.47*  HGB 8.9* 8.9* 9.4*  HCT 29.3* 28.2* 30.9*  MCV 89.1 89.0 89.0  MCH 27.1 28.1 27.1  MCHC 30.4 31.6 30.4  RDW 15.6* 15.8* 15.8*  PLT 321 302 322    Cardiac Enzymes  Recent Labs Lab 11/27/16 0159 11/27/16 1404 11/27/16 1924 11/28/16 0108  TROPONINI 1.56* 1.92* 1.42* 1.29*   No results for input(s): TROPIPOC in the last 168 hours.   BNP  Recent Labs Lab 11/27/16 0159  BNP 920.0*     DDimer No results for input(s): DDIMER in the last 168 hours.   Radiology    No results found.  Cardiac Studies   Cath 11/28/2016 Conclusion    Severe three-vessel native coronary disease with chronic total occlusion of the RCA supplied by left-to-right collaterals. Inferior wall branches are graftable.  Total occlusion of the mid circumflex with retrograde filling of 2 large obtuse marginal branches. Branches appear graftable.  Severe LAD and diagonal disease with subtotal occlusion in the mid vessel of the LAD. 90% stenosis in the proximal portion of large diagonal.  Elevated left ventricular end-diastolic pressure during tachycardia.  Sustained, hemodynamically stable ventricular tachycardia during the  procedure that required pace termination via the AICD.  RECOMMENDATIONS:   Assessment of LV function by CT and/or echo with specific attention to the presence absence of an LV aneurysm involving the inferior wall.  TCTS notified of need for CABG and ? Aneurysm resection if present.   Echo 11/29/16 Study Conclusions  - Left ventricle: The cavity size was mildly dilated. Systolic   function was moderately reduced. The estimated ejection fraction   was in the range of 35% to 40%. There is akinesis of the basal   and mid inferior, inferolateral walls and apical inferior wall.   Features are consistent with a pseudonormal left ventricular   filling pattern, with concomitant abnormal relaxation and   increased filling pressure (grade 2 diastolic dysfunction).   Doppler parameters are consistent with elevated ventricular   end-diastolic filling pressure. - Aortic valve: Trileaflet; normal thickness leaflets. There was no   regurgitation. - Aortic root: The aortic root was normal in size. - Mitral valve: There was moderate regurgitation. - Left atrium: The atrium was severely dilated. - Right atrium: The atrium was moderately dilated. Pacer wire or   catheter noted in right atrium. - Tricuspid valve: There was severe regurgitation. - Pulmonary arteries: Systolic pressure was severely increased. PA   peak pressure: 87 mm Hg (S). - Inferior vena cava: The vessel was dilated. The respirophasic   diameter changes were blunted (< 50%), consistent with elevated   central venous pressure. - Pericardium, extracardiac: There was no pericardial effusion.  Impressions:  - Since the prior study on 09/20/2015 LVEF still has moderate   systolic dysfunction at 87-68%.   There is new severe tricuspid regurgitation and severe pulmonary   hypertension with RVSP 87 mmHg.  Patient Profile   71 y.o. female with past medical history of CAD (3-vessel CAD by cath in 04/2015 -->CABG consideration in  the future), PAF (on Xarelto), Type 2 DM, VT (s/p placement of St. Jude ICD in 04/2015), chronic diastolic CHF, orthostatic hypotension and moderate pulmonary HTN who presents to [facility] ED on 11/27/2016 following a syncopal episode secondary to  VFib requiring ICD firing. Cath showed 3v CAD, undergoing TCTS evaluation. CABG 5/3 with other procedure.    Assessment & Plan    1. NSTEMI with 3V CAD  - known 3v disease since 04/2015 on cath, CABG recommended, but she declined.   - Peak of troponin 1.92 then trended down.   Cath 11/28/2016 showed 90% prox diag, occluded RCA and LCx, severe LAD disease with occlusion of mid LAD.  - Plan for CABG on 12/04/2016 for CABG with MAZE and atrial appendage clip. No aneurysm on TTE however she has severe pulmonary HTN and severe TR with AICD lead across  2. Syncope 2/2 ventricular fibrillation - s/p placement of St. Jude ICD in 04/2015. Interrogation after syncope showed 6 episodes of vfibrillation  - Seen by EP 11/27/2016, started on mexiletine. Suspect triggered by ischemia  - few episode of wide complex tachycardia.  Improved with addition of  Mexiletine.   3. PAF - Maintaining sinus rhythm. CHADSVASc score of 5. Continue heparin for anticoagulation.   4. Acute on chronic combined CHF - BNP 920 on admission. Net I & O negative 4.2L/-1.2L. She is euvolemic. Continue lasix 40mg  IV BID. Scr stable this morning.   Signed, Leanor Kail, PA  12/02/2016, 7:25 AM

## 2016-12-02 NOTE — Care Management Note (Signed)
Case Management Note  Patient Details  Name: [name] MRN: [id number] Date of Birth: September 01, 1945  Subjective/Objective: Pt presented for chest pain - post cardiac cath 11-28-16 revealled 3 vessel CAD- Plan for CABG on Thursday. Pt is from home with daughter and grandson.             Action/Plan: CM will continue to monitor for disposition needs post d/c.   Expected Discharge Date:                  Expected Discharge Plan:  [place]  In-House Referral:     Discharge planning Services  CM Consult  Post Acute Care Choice:    Choice offered to:     DME Arranged:    DME Agency:     HH Arranged:    [place] Agency:     Status of Service:  In process, will continue to follow  If discussed at Long Length of Stay Meetings, dates discussed:    Additional Comments:  Bethena Roys, RN 12/02/2016, 2:50 PM

## 2016-12-02 NOTE — Progress Notes (Signed)
Pre-op Cardiac Surgery  Carotid Findings:  Findings suggest 1-39% internal carotid artery stenosis bilaterally. The right vertebral artery is patent with antegrade flow. Unable to visualize the left vertebral artery.  Upper Extremity Right Left  Brachial Pressures 150-Triphasic 142-Triphasic  Radial Waveforms Triphasic Triphasic  Ulnar Waveforms Triphasic Triphasic  Palmar Arch (Allen's Test) Signal obliterates with radial compression, is unaffected with ulnar compression. Signal decreases 50% with radial compression, decreases <50% with ulnar compression.    Lower  Extremity Right Left  Dorsalis Pedis 118-Dampened monophasic 108-Monophasic  Anterior Tibial    Posterior Tibial 93-Dampened monophasic Absent  Ankle/Brachial Indices 0.79 0.72    Findings:   The right ABI is suggestive of moderate, borderline mild, arterial insufficiency at rest. The left ABI is suggestive of moderate arterial insufficiency at rest.  12/02/2016 11:24 AM Maudry Mayhew, BS, RVT, RDCS, RDMS

## 2016-12-03 DIAGNOSIS — I25110 Atherosclerotic heart disease of native coronary artery with unstable angina pectoris: Secondary | ICD-10-CM

## 2016-12-03 DIAGNOSIS — I481 Persistent atrial fibrillation: Secondary | ICD-10-CM

## 2016-12-03 LAB — BASIC METABOLIC PANEL
Anion gap: 10 (ref 5–15)
BUN: 21 mg/dL — AB (ref 6–20)
CHLORIDE: 101 mmol/L (ref 101–111)
CO2: 26 mmol/L (ref 22–32)
Calcium: 9.4 mg/dL (ref 8.9–10.3)
Creatinine, Ser: 1.17 mg/dL — ABNORMAL HIGH (ref 0.44–1.00)
GFR calc non Af Amer: 47 mL/min — ABNORMAL LOW (ref >60)
GFR, EST AFRICAN AMERICAN: 54 mL/min — AB (ref >60)
Glucose, Bld: 128 mg/dL — ABNORMAL HIGH (ref 65–99)
POTASSIUM: 4.1 mmol/L (ref 3.5–5.1)
SODIUM: 137 mmol/L (ref 135–145)

## 2016-12-03 LAB — CBC
HEMATOCRIT: 30.6 % — AB (ref 36.0–46.0)
Hemoglobin: 9.4 g/dL — ABNORMAL LOW (ref 12.0–15.0)
MCH: 27.4 pg (ref 26.0–34.0)
MCHC: 30.7 g/dL (ref 30.0–36.0)
MCV: 89.2 fL (ref 78.0–100.0)
PLATELETS: 345 10*3/uL (ref 150–400)
RBC: 3.43 MIL/uL — ABNORMAL LOW (ref 3.87–5.11)
RDW: 16.1 % — AB (ref 11.5–15.5)
WBC: 9.3 10*3/uL (ref 4.0–10.5)

## 2016-12-03 LAB — GLUCOSE, CAPILLARY
GLUCOSE-CAPILLARY: 154 mg/dL — AB (ref 65–99)
Glucose-Capillary: 115 mg/dL — ABNORMAL HIGH (ref 65–99)
Glucose-Capillary: 160 mg/dL — ABNORMAL HIGH (ref 65–99)
Glucose-Capillary: 153 mg/dL — ABNORMAL HIGH (ref 65–99)

## 2016-12-03 LAB — SURGICAL PCR SCREEN
MRSA, PCR: NEGATIVE
STAPHYLOCOCCUS AUREUS: NEGATIVE

## 2016-12-03 LAB — ABO/RH: ABO/RH(D): A NEG

## 2016-12-03 LAB — HEPARIN LEVEL (UNFRACTIONATED): Heparin Unfractionated: 0.55 IU/mL (ref 0.30–0.70)

## 2016-12-03 MED ORDER — CEFUROXIME SODIUM 1.5 G IJ SOLR
1.5000 g | INTRAMUSCULAR | Status: AC
  Administered 2016-12-04: 1.5 g via INTRAVENOUS
  Administered 2016-12-04: .75 g via INTRAVENOUS
  Filled 2016-12-03: qty 1.5

## 2016-12-03 MED ORDER — POTASSIUM CHLORIDE 2 MEQ/ML IV SOLN
80.0000 meq | INTRAVENOUS | Status: DC
  Filled 2016-12-03: qty 40

## 2016-12-03 MED ORDER — NITROGLYCERIN IN D5W 200-5 MCG/ML-% IV SOLN
2.0000 ug/min | INTRAVENOUS | Status: AC
  Administered 2016-12-04: 5 ug/min via INTRAVENOUS
  Filled 2016-12-03: qty 250

## 2016-12-03 MED ORDER — DEXTROSE 5 % IV SOLN
0.0000 ug/min | INTRAVENOUS | Status: DC
  Filled 2016-12-03: qty 4

## 2016-12-03 MED ORDER — METOPROLOL TARTRATE 12.5 MG HALF TABLET
12.5000 mg | ORAL_TABLET | ORAL | Status: AC
  Administered 2016-12-04: 12.5 mg via ORAL
  Filled 2016-12-03: qty 1

## 2016-12-03 MED ORDER — TRANEXAMIC ACID (OHS) PUMP PRIME SOLUTION
2.0000 mg/kg | INTRAVENOUS | Status: DC
  Filled 2016-12-03: qty 1.57

## 2016-12-03 MED ORDER — VANCOMYCIN HCL 10 G IV SOLR
1250.0000 mg | INTRAVENOUS | Status: AC
  Administered 2016-12-04: 1250 mg via INTRAVENOUS
  Filled 2016-12-03: qty 1250

## 2016-12-03 MED ORDER — DEXTROSE 5 % IV SOLN
750.0000 mg | INTRAVENOUS | Status: DC
  Filled 2016-12-03: qty 750

## 2016-12-03 MED ORDER — PLASMA-LYTE 148 IV SOLN
INTRAVENOUS | Status: AC
  Administered 2016-12-04: 500 mL
  Filled 2016-12-03: qty 2.5

## 2016-12-03 MED ORDER — CHLORHEXIDINE GLUCONATE 0.12 % MT SOLN
15.0000 mL | OROMUCOSAL | Status: AC
  Administered 2016-12-04: 15 mL via OROMUCOSAL
  Filled 2016-12-03: qty 15

## 2016-12-03 MED ORDER — SODIUM CHLORIDE 0.9 % IV SOLN
30.0000 ug/min | INTRAVENOUS | Status: DC
  Filled 2016-12-03: qty 2

## 2016-12-03 MED ORDER — METOPROLOL TARTRATE 12.5 MG HALF TABLET
12.5000 mg | ORAL_TABLET | Freq: Two times a day (BID) | ORAL | Status: DC
  Filled 2016-12-03: qty 1

## 2016-12-03 MED ORDER — TRANEXAMIC ACID 1000 MG/10ML IV SOLN
1.5000 mg/kg/h | INTRAVENOUS | Status: AC
  Administered 2016-12-04: 1.5 mg/kg/h via INTRAVENOUS
  Filled 2016-12-03: qty 25

## 2016-12-03 MED ORDER — CHLORHEXIDINE GLUCONATE 4 % EX LIQD
CUTANEOUS | Status: AC
  Filled 2016-12-03: qty 15

## 2016-12-03 MED ORDER — DOPAMINE-DEXTROSE 3.2-5 MG/ML-% IV SOLN
0.0000 ug/kg/min | INTRAVENOUS | Status: DC
  Filled 2016-12-03: qty 250

## 2016-12-03 MED ORDER — CHLORHEXIDINE GLUCONATE CLOTH 2 % EX PADS: 6.0000 | MEDICATED_PAD | Freq: Once | CUTANEOUS | Status: DC

## 2016-12-03 MED ORDER — SODIUM CHLORIDE 0.9 % IV SOLN
INTRAVENOUS | Status: AC
  Administered 2016-12-04: 1 [IU]/h via INTRAVENOUS
  Filled 2016-12-03: qty 2.5

## 2016-12-03 MED ORDER — TRANEXAMIC ACID (OHS) BOLUS VIA INFUSION
15.0000 mg/kg | INTRAVENOUS | Status: AC
  Administered 2016-12-04: 1176 mg via INTRAVENOUS
  Filled 2016-12-03: qty 1176

## 2016-12-03 MED ORDER — METOPROLOL TARTRATE 12.5 MG HALF TABLET: 12.5000 mg | ORAL_TABLET | Freq: Two times a day (BID) | ORAL | Status: DC | PRN

## 2016-12-03 MED ORDER — SODIUM CHLORIDE 0.9 % IV SOLN
INTRAVENOUS | Status: DC
  Filled 2016-12-03: qty 30

## 2016-12-03 MED ORDER — MAGNESIUM SULFATE 50 % IJ SOLN
40.0000 meq | INTRAMUSCULAR | Status: DC
  Filled 2016-12-03: qty 10

## 2016-12-03 MED ORDER — DEXMEDETOMIDINE HCL IN NACL 400 MCG/100ML IV SOLN
0.1000 ug/kg/h | INTRAVENOUS | Status: AC
  Administered 2016-12-04: .3 ug/kg/h via INTRAVENOUS
  Filled 2016-12-03: qty 100

## 2016-12-03 MED ORDER — CHLORHEXIDINE GLUCONATE CLOTH 2 % EX PADS
6.0000 | MEDICATED_PAD | Freq: Once | CUTANEOUS | Status: AC
  Administered 2016-12-03: 6 via TOPICAL

## 2016-12-03 MED ORDER — TEMAZEPAM 15 MG PO CAPS
15.0000 mg | ORAL_CAPSULE | Freq: Once | ORAL | Status: AC
  Administered 2016-12-03: 15 mg via ORAL
  Filled 2016-12-03: qty 1

## 2016-12-03 NOTE — Progress Notes (Signed)
5 Days Post-Op Procedure(s) (LRB): Left Heart Cath and Coronary Angiography (N/A) Subjective: No complaints this morning  Objective: Vital signs in last 24 hours: Temp:  [97.8 F (36.6 C)-98.5 F (36.9 C)] 98.1 F (36.7 C) (05/02 0749) Pulse Rate:  [60-91] 60 (05/02 0749) Cardiac Rhythm: Atrial paced (05/01 2100) Resp:  [18-20] 18 (05/02 0749) BP: (98-156)/(44-79) 106/47 (05/02 0749) SpO2:  [91 %-98 %] 96 % (05/02 0749) Weight:  [172 lb 14.4 oz (78.4 kg)] 172 lb 14.4 oz (78.4 kg) (05/02 0408)  Hemodynamic parameters for last 24 hours:    Intake/Output from previous day: 05/01 0701 - 05/02 0700 In: 1050 [P.O.:900; I.V.:150] Out: 2000 [Urine:2000] Intake/Output this shift: No intake/output data recorded.  General appearance: alert, cooperative and no distress Neurologic: intact Heart: regular rate and rhythm Lungs: diminished breath sounds bibasilar  Lab Results:  Recent Labs  12/02/16 0315 12/03/16 0249  WBC 8.9 9.3  HGB 9.4* 9.4*  HCT 30.9* 30.6*  PLT 322 345   BMET:  Recent Labs  12/01/16 0304 12/02/16 0315  NA 140 138  K 4.6 3.8  CL 105 105  CO2 27 25  GLUCOSE 128* 132*  BUN 31* 23*  CREATININE 1.25* 1.08*  CALCIUM 9.3 8.9    PT/INR: No results for input(s): LABPROT, INR in the last 72 hours. ABG    Component Value Date/Time   PHART 7.388 04/14/2015 0337   HCO3 24.7 (H) 04/14/2015 0337   TCO2 26.0 04/14/2015 0337   O2SAT 95.5 04/14/2015 0337   CBG (last 3)   Recent Labs  12/02/16 1714 12/02/16 2135 12/03/16 0746  GLUCAP 121* 152* 115*    Assessment/Plan: S/P Procedure(s) (LRB): Left Heart Cath and Coronary Angiography (N/A) -For CABG and left atrial clip in AM. I discussed possibly doing Maze procedure as well depending on intraoperative findings and she is agreeable to that. I will only do Maze if I feel it can be done without unduly increasing risk of procedure. Will also assess mitral and tricuspid valves intraoperatively. I do not  think her MR is significant enough to warrant repair based on the preop echo, but will make final decision based on TEE. She has severe TR but with ICD lead across TV there is not much to be done about it.  She is aware of the indications, risks, benefits and alternatives. She accepts the risks and agrees to proceed.  For OR tomorrow   LOS: 6 days    Melrose Nakayama 12/03/2016

## 2016-12-03 NOTE — Progress Notes (Signed)
ANTICOAGULATION CONSULT NOTE - Follow Up Consult  Pharmacy Consult for Heparin (Xarelto on hold) Indication: chest pain/ACS and atrial fibrillation, plan for CABG 5/3  Allergies  Allergen Reactions  . No Known Allergies     Patient Measurements: Height: 5\' 4"  (162.6 cm) Weight: 172 lb 14.4 oz (78.4 kg) IBW/kg (Calculated) : 54.7  Vital Signs: Temp: 98.1 F (36.7 C) (05/02 0749) Temp Source: Oral (05/02 0749) BP: 106/47 (05/02 0749) Pulse Rate: 60 (05/02 0749)  Labs:  Recent Labs  12/01/16 0304 12/02/16 0315 12/03/16 0249  HGB 8.9* 9.4* 9.4*  HCT 28.2* 30.9* 30.6*  PLT 302 322 345  APTT 74*  --   --   HEPARINUNFRC 0.59 0.53 0.55  CREATININE 1.25* 1.08*  --     Estimated Creatinine Clearance: 50.5 mL/min (A) (by C-G formula based on SCr of 1.08 mg/dL (H)).  Assessment: Heparin for afib + ACS while awaiting CABG on 5/3. Using aPTT to dose heparin for now given Xarelto influence on anti-Xa levels (last dose 4/25).  HL remains therapeutic at 0.55 on heparin 1250 units/hr. Hgb remains low but stable, plt wnl. No issues with infusion or bleeding noted  Goal of Therapy:  Heparin level 0.3-0.7 units/ml Monitor platelets by anticoagulation protocol: Yes   Plan:  Continue heparin 1250 units/hr Daily HL/CBC Monitor s/sx of bleeding  F/u CABG on 5/3  Taliesin Hartlage M. Diona Foley, PharmD, BCPS Clinical Pharmacist [phone] 12/03/2016 8:08 AM

## 2016-12-03 NOTE — Progress Notes (Signed)
Progress Note  Patient Name: [name] Date of Encounter: 12/03/2016  Primary Cardiologist: Dr. Haroldine Laws Primary Electrophysiologist: Dr. Lovena Le  Subjective   Prior hx of orthostatic hypotension on metoprolol. Last night her BP drop down to 90s and felt dizzy and lightheaded. Resolved this morning.   Inpatient Medications    Scheduled Meds: . atorvastatin  80 mg Oral q1800  . furosemide  40 mg Intravenous BID  . insulin aspart  0-15 Units Subcutaneous TID WC  . metoprolol tartrate  25 mg Oral BID  . mexiletine  300 mg Oral Q8H  . senna-docusate  1 tablet Oral BID  . sodium chloride flush  3 mL Intravenous Q12H  . sodium chloride flush  3 mL Intravenous Q12H   Continuous Infusions: . sodium chloride    . sodium chloride    . heparin 1,250 Units/hr (12/03/16 0053)   PRN Meds: sodium chloride, sodium chloride, acetaminophen, acetaminophen, ALPRAZolam, alum & mag hydroxide-simeth, magnesium hydroxide, ondansetron (ZOFRAN) IV, oxyCODONE-acetaminophen, promethazine, sodium chloride flush, sodium chloride flush   Vital Signs    Vitals:   12/02/16 2045 12/03/16 0031 12/03/16 0408 12/03/16 0749  BP: 126/69 (!) 114/53 (!) 98/44 (!) 106/47  Pulse: 60 91 62 60  Resp: 20 18 18 18   Temp: 97.8 F (36.6 C) 98 F (36.7 C) 98.5 F (36.9 C) 98.1 F (36.7 C)  TempSrc:  Oral Oral Oral  SpO2: 98% 95% 93% 96%  Weight:   172 lb 14.4 oz (78.4 kg)   Height:        Intake/Output Summary (Last 24 hours) at 12/03/16 0823 Last data filed at 12/02/16 2300  Gross per 24 hour  Intake              810 ml  Output             2000 ml  Net            -1190 ml   Filed Weights   12/01/16 0327 12/02/16 0521 12/03/16 0408  Weight: 171 lb 1.6 oz (77.6 kg) 172 lb (78 kg) 172 lb 14.4 oz (78.4 kg)    Telemetry    NSR with PVcs- Personally Reviewed  ECG    None today   Physical Exam   GEN: No acute distress.   Neck: No JVD Cardiac: RRR, +  murmurs, rubs, or gallops.    Respiratory: Clear to auscultation bilaterally. GI: Soft, nontender, non-distended  MS: No edema; No deformity. Neuro:  Nonfocal  Psych: Normal affect   Labs    Chemistry Recent Labs Lab 11/27/16 0159  11/30/16 0445 12/01/16 0304 12/02/16 0315  NA 137  < > 139 140 138  K 3.9  < > 3.9 4.6 3.8  CL 106  < > 106 105 105  CO2 21*  < > 25 27 25   GLUCOSE 169*  < > 90 128* 132*  BUN 37*  < > 31* 31* 23*  CREATININE 1.17*  < > 1.18* 1.25* 1.08*  CALCIUM 8.9  < > 8.7* 9.3 8.9  PROT 6.9  --   --   --   --   ALBUMIN 3.5  --   --   --   --   AST 16  --   --   --   --   ALT 14  --   --   --   --   ALKPHOS 96  --   --   --   --   BILITOT 0.9  --   --   --   --  GFRNONAA 47*  < > 46* 43* 52*  GFRAA 54*  < > 54* 50* 60*  ANIONGAP 10  < > 8 8 8   < > = values in this interval not displayed.   Hematology  Recent Labs Lab 12/01/16 0304 12/02/16 0315 12/03/16 0249  WBC 8.1 8.9 9.3  RBC 3.17* 3.47* 3.43*  HGB 8.9* 9.4* 9.4*  HCT 28.2* 30.9* 30.6*  MCV 89.0 89.0 89.2  MCH 28.1 27.1 27.4  MCHC 31.6 30.4 30.7  RDW 15.8* 15.8* 16.1*  PLT 302 322 345    Cardiac Enzymes  Recent Labs Lab 11/27/16 0159 11/27/16 1404 11/27/16 1924 11/28/16 0108  TROPONINI 1.56* 1.92* 1.42* 1.29*   No results for input(s): TROPIPOC in the last 168 hours.   BNP  Recent Labs Lab 11/27/16 0159  BNP 920.0*     DDimer No results for input(s): DDIMER in the last 168 hours.   Radiology    No results found.  Cardiac Studies   Cath 11/28/2016 Conclusion    Severe three-vessel native coronary disease with chronic total occlusion of the RCA supplied by left-to-right collaterals. Inferior wall branches are graftable.  Total occlusion of the mid circumflex with retrograde filling of 2 large obtuse marginal branches. Branches appear graftable.  Severe LAD and diagonal disease with subtotal occlusion in the mid vessel of the LAD. 90% stenosis in the proximal portion of large  diagonal.  Elevated left ventricular end-diastolic pressure during tachycardia.  Sustained, hemodynamically stable ventricular tachycardia during the procedure that required pace termination via the AICD.  RECOMMENDATIONS:   Assessment of LV function by CT and/or echo with specific attention to the presence absence of an LV aneurysm involving the inferior wall.  TCTS notified of need for CABG and ? Aneurysm resection if present.   Echo 11/29/16 Study Conclusions  - Left ventricle: The cavity size was mildly dilated. Systolic   function was moderately reduced. The estimated ejection fraction   was in the range of 35% to 40%. There is akinesis of the basal   and mid inferior, inferolateral walls and apical inferior wall.   Features are consistent with a pseudonormal left ventricular   filling pattern, with concomitant abnormal relaxation and   increased filling pressure (grade 2 diastolic dysfunction).   Doppler parameters are consistent with elevated ventricular   end-diastolic filling pressure. - Aortic valve: Trileaflet; normal thickness leaflets. There was no   regurgitation. - Aortic root: The aortic root was normal in size. - Mitral valve: There was moderate regurgitation. - Left atrium: The atrium was severely dilated. - Right atrium: The atrium was moderately dilated. Pacer wire or   catheter noted in right atrium. - Tricuspid valve: There was severe regurgitation. - Pulmonary arteries: Systolic pressure was severely increased. PA   peak pressure: 87 mm Hg (S). - Inferior vena cava: The vessel was dilated. The respirophasic   diameter changes were blunted (< 50%), consistent with elevated   central venous pressure. - Pericardium, extracardiac: There was no pericardial effusion.  Impressions:  - Since the prior study on 09/20/2015 LVEF still has moderate   systolic dysfunction at 54-65%.   There is new severe tricuspid regurgitation and severe pulmonary    hypertension with RVSP 87 mmHg.  Patient Profile   71 y.o. female with past medical history of CAD (3-vessel CAD by cath in 04/2015 -->CABG consideration in the future), PAF (on Xarelto), Type 2 DM, VT (s/p placement of [employer] Jude ICD in 04/2015), chronic diastolic CHF, orthostatic hypotension  and moderate pulmonary HTN who presents to [facility] ED on 11/27/2016 following a syncopal episode secondary to VFib requiring ICD firing. Cath showed 3v CAD, undergoing TCTS evaluation. CABG 5/3 with other procedure.    Assessment & Plan    1. NSTEMI with 3V CAD  - known 3v disease since 04/2015 on cath, CABG recommended, but she declined.   - Peak of troponin 1.92 then trended down.   Cath 11/28/2016 showed 90% prox diag, occluded RCA and LCx, severe LAD disease with occlusion of mid LAD.  - Plan for CABG tomorrow  for CABG with MAZE and atrial appendage clip. No aneurysm on TTE however she has severe pulmonary HTN and severe TR with AICD lead across -  Unable to tolerate addition of metoprolol 25mg  BID. Prior hx of orthostatic on this. Will reduced dose to 12.5mg  BID. Follow closely.   2. Syncope 2/2 ventricular fibrillation - s/p placement of St. Jude ICD in 04/2015. Interrogation after syncope showed 6 episodes of vfibrillation  - Seen by EP 11/27/2016, started on mexiletine. Suspect triggered by ischemia  - few episode of wide complex tachycardia.  Improved with addition of  Mexiletine.   3. PAF - Maintaining sinus rhythm. CHADSVASc score of 5. Continue heparin for anticoagulation.   4. Acute on chronic combined CHF - BNP 920 on admission. Net I & O negative 4.2L/-1.2L. She is euvolemic. Continue lasix 40mg  IV BID. Scr stable this morning.   Signed, Leanor Kail, PA  12/03/2016, 8:23 AM

## 2016-12-04 ENCOUNTER — Encounter (HOSPITAL_COMMUNITY): Payer: Self-pay | Admitting: Certified Registered Nurse Anesthetist

## 2016-12-04 ENCOUNTER — Inpatient Hospital Stay (HOSPITAL_COMMUNITY): Payer: PPO

## 2016-12-04 ENCOUNTER — Inpatient Hospital Stay (HOSPITAL_COMMUNITY)
Admission: EM | Disposition: A | Discharge status: Home / Self Care | Payer: Self-pay | Source: Home / Self Care | Attending: Thoracic Surgery (Cardiothoracic Vascular Surgery)

## 2016-12-04 ENCOUNTER — Inpatient Hospital Stay (HOSPITAL_COMMUNITY): Payer: PPO | Admitting: Certified Registered Nurse Anesthetist

## 2016-12-04 DIAGNOSIS — Z951 Presence of aortocoronary bypass graft: Secondary | ICD-10-CM

## 2016-12-04 DIAGNOSIS — I25110 Atherosclerotic heart disease of native coronary artery with unstable angina pectoris: Secondary | ICD-10-CM

## 2016-12-04 DIAGNOSIS — I480 Paroxysmal atrial fibrillation: Secondary | ICD-10-CM

## 2016-12-04 HISTORY — PX: CLIPPING OF ATRIAL APPENDAGE: SHX5773

## 2016-12-04 HISTORY — PX: CORONARY ARTERY BYPASS GRAFT: SHX141

## 2016-12-04 HISTORY — PX: MAZE: SHX5063

## 2016-12-04 HISTORY — PX: INTRAOPERATIVE TRANSESOPHAGEAL ECHOCARDIOGRAM: SHX5062

## 2016-12-04 LAB — POCT I-STAT, CHEM 8
BUN: 22 mg/dL — AB (ref 6–20)
BUN: 17 mg/dL (ref 6–20)
BUN: 19 mg/dL (ref 6–20)
BUN: 20 mg/dL (ref 6–20)
BUN: 19 mg/dL (ref 6–20)
CALCIUM ION: 1.21 mmol/L (ref 1.15–1.40)
CALCIUM ION: 0.93 mmol/L — AB (ref 1.15–1.40)
CALCIUM ION: 1.04 mmol/L — AB (ref 1.15–1.40)
CHLORIDE: 99 mmol/L — AB (ref 101–111)
CHLORIDE: 97 mmol/L — AB (ref 101–111)
CREATININE: 0.7 mg/dL (ref 0.44–1.00)
CREATININE: 1 mg/dL (ref 0.44–1.00)
Calcium, Ion: 1.07 mmol/L — ABNORMAL LOW (ref 1.15–1.40)
Calcium, Ion: 1.08 mmol/L — ABNORMAL LOW (ref 1.15–1.40)
Chloride: 95 mmol/L — ABNORMAL LOW (ref 101–111)
Chloride: 100 mmol/L — ABNORMAL LOW (ref 101–111)
Chloride: 98 mmol/L — ABNORMAL LOW (ref 101–111)
Creatinine, Ser: 1.2 mg/dL — ABNORMAL HIGH (ref 0.44–1.00)
Creatinine, Ser: 0.9 mg/dL (ref 0.44–1.00)
Creatinine, Ser: 1.1 mg/dL — ABNORMAL HIGH (ref 0.44–1.00)
GLUCOSE: 121 mg/dL — AB (ref 65–99)
GLUCOSE: 159 mg/dL — AB (ref 65–99)
Glucose, Bld: 104 mg/dL — ABNORMAL HIGH (ref 65–99)
Glucose, Bld: 125 mg/dL — ABNORMAL HIGH (ref 65–99)
Glucose, Bld: 131 mg/dL — ABNORMAL HIGH (ref 65–99)
HCT: 28 % — ABNORMAL LOW (ref 36.0–46.0)
HEMATOCRIT: 22 % — AB (ref 36.0–46.0)
HEMATOCRIT: 20 % — AB (ref 36.0–46.0)
HEMATOCRIT: 22 % — AB (ref 36.0–46.0)
HEMATOCRIT: 22 % — AB (ref 36.0–46.0)
HEMOGLOBIN: 6.8 g/dL — AB (ref 12.0–15.0)
HEMOGLOBIN: 7.5 g/dL — AB (ref 12.0–15.0)
HEMOGLOBIN: 7.5 g/dL — AB (ref 12.0–15.0)
Hemoglobin: 9.5 g/dL — ABNORMAL LOW (ref 12.0–15.0)
Hemoglobin: 7.5 g/dL — ABNORMAL LOW (ref 12.0–15.0)
POTASSIUM: 4.9 mmol/L (ref 3.5–5.1)
POTASSIUM: 4.7 mmol/L (ref 3.5–5.1)
Potassium: 3.7 mmol/L (ref 3.5–5.1)
Potassium: 4 mmol/L (ref 3.5–5.1)
Potassium: 5.5 mmol/L — ABNORMAL HIGH (ref 3.5–5.1)
SODIUM: 138 mmol/L (ref 135–145)
SODIUM: 139 mmol/L (ref 135–145)
SODIUM: 133 mmol/L — AB (ref 135–145)
SODIUM: 134 mmol/L — AB (ref 135–145)
SODIUM: 137 mmol/L (ref 135–145)
TCO2: 31 mmol/L (ref 0–100)
TCO2: 32 mmol/L (ref 0–100)
TCO2: 31 mmol/L (ref 0–100)
TCO2: 31 mmol/L (ref 0–100)
TCO2: 29 mmol/L (ref 0–100)

## 2016-12-04 LAB — PROTIME-INR
INR: 1.34
Prothrombin Time: 16.7 seconds — ABNORMAL HIGH (ref 11.4–15.2)

## 2016-12-04 LAB — CBC
HEMATOCRIT: 30.2 % — AB (ref 36.0–46.0)
HEMATOCRIT: 31.8 % — AB (ref 36.0–46.0)
HEMOGLOBIN: 9.4 g/dL — AB (ref 12.0–15.0)
HEMOGLOBIN: 10 g/dL — AB (ref 12.0–15.0)
MCH: 27.8 pg (ref 26.0–34.0)
MCH: 27.7 pg (ref 26.0–34.0)
MCHC: 31.1 g/dL (ref 30.0–36.0)
MCHC: 31.4 g/dL (ref 30.0–36.0)
MCV: 89.3 fL (ref 78.0–100.0)
MCV: 88.1 fL (ref 78.0–100.0)
Platelets: 314 10*3/uL (ref 150–400)
Platelets: 281 10*3/uL (ref 150–400)
RBC: 3.38 MIL/uL — ABNORMAL LOW (ref 3.87–5.11)
RBC: 3.61 MIL/uL — AB (ref 3.87–5.11)
RDW: 16.4 % — AB (ref 11.5–15.5)
RDW: 16.2 % — ABNORMAL HIGH (ref 11.5–15.5)
WBC: 9.1 10*3/uL (ref 4.0–10.5)
WBC: 17.9 10*3/uL — ABNORMAL HIGH (ref 4.0–10.5)

## 2016-12-04 LAB — HEPARIN LEVEL (UNFRACTIONATED): Heparin Unfractionated: 0.65 IU/mL (ref 0.30–0.70)

## 2016-12-04 LAB — POCT I-STAT 3, ART BLOOD GAS (G3+)
ACID-BASE EXCESS: 7 mmol/L — AB (ref 0.0–2.0)
Bicarbonate: 30.3 mmol/L — ABNORMAL HIGH (ref 20.0–28.0)
O2 SAT: 100 %
PO2 ART: 453 mmHg — AB (ref 83.0–108.0)
TCO2: 31 mmol/L (ref 0–100)
pCO2 arterial: 38.2 mmHg (ref 32.0–48.0)
pH, Arterial: 7.507 — ABNORMAL HIGH (ref 7.350–7.450)

## 2016-12-04 LAB — BASIC METABOLIC PANEL
Anion gap: 8 (ref 5–15)
BUN: 21 mg/dL — AB (ref 6–20)
CHLORIDE: 99 mmol/L — AB (ref 101–111)
CO2: 27 mmol/L (ref 22–32)
CREATININE: 1.17 mg/dL — AB (ref 0.44–1.00)
Calcium: 8.9 mg/dL (ref 8.9–10.3)
GFR calc Af Amer: 54 mL/min — ABNORMAL LOW (ref >60)
GFR calc non Af Amer: 47 mL/min — ABNORMAL LOW (ref >60)
GLUCOSE: 156 mg/dL — AB (ref 65–99)
POTASSIUM: 3.7 mmol/L (ref 3.5–5.1)
SODIUM: 134 mmol/L — AB (ref 135–145)

## 2016-12-04 LAB — HEMOGLOBIN AND HEMATOCRIT, BLOOD
HEMATOCRIT: 21.7 % — AB (ref 36.0–46.0)
HEMOGLOBIN: 6.8 g/dL — AB (ref 12.0–15.0)

## 2016-12-04 LAB — PREPARE RBC (CROSSMATCH)

## 2016-12-04 LAB — APTT: aPTT: 26 seconds (ref 24–36)

## 2016-12-04 LAB — PLATELET COUNT: Platelets: 218 10*3/uL (ref 150–400)

## 2016-12-04 LAB — GLUCOSE, CAPILLARY
GLUCOSE-CAPILLARY: 136 mg/dL — AB (ref 65–99)
GLUCOSE-CAPILLARY: 106 mg/dL — AB (ref 65–99)
GLUCOSE-CAPILLARY: 165 mg/dL — AB (ref 65–99)
Glucose-Capillary: 124 mg/dL — ABNORMAL HIGH (ref 65–99)
Glucose-Capillary: 99 mg/dL (ref 65–99)
Glucose-Capillary: 91 mg/dL (ref 65–99)
Glucose-Capillary: 91 mg/dL (ref 65–99)
Glucose-Capillary: 131 mg/dL — ABNORMAL HIGH (ref 65–99)

## 2016-12-04 SURGERY — CORONARY ARTERY BYPASS GRAFTING (CABG)
Anesthesia: General | Site: Chest

## 2016-12-04 MED ORDER — METOPROLOL TARTRATE 12.5 MG HALF TABLET
12.5000 mg | ORAL_TABLET | Freq: Two times a day (BID) | ORAL | Status: DC
  Administered 2016-12-08: 12.5 mg via ORAL
  Filled 2016-12-04 (×3): qty 1

## 2016-12-04 MED ORDER — HEPARIN SODIUM (PORCINE) 1000 UNIT/ML IJ SOLN
INTRAMUSCULAR | Status: DC | PRN
  Administered 2016-12-04: 31000 [IU] via INTRAVENOUS
  Administered 2016-12-04: 2000 [IU] via INTRAVENOUS

## 2016-12-04 MED ORDER — MORPHINE SULFATE (PF) 4 MG/ML IV SOLN: 1.0000 mg | INTRAVENOUS | Status: AC | PRN

## 2016-12-04 MED ORDER — ORAL CARE MOUTH RINSE: 15.0000 mL | Freq: Four times a day (QID) | OROMUCOSAL | Status: DC

## 2016-12-04 MED ORDER — DOCUSATE SODIUM 100 MG PO CAPS
200.0000 mg | ORAL_CAPSULE | Freq: Every day | ORAL | Status: DC
  Administered 2016-12-05 – 2016-12-15 (×11): 200 mg via ORAL
  Filled 2016-12-04 (×11): qty 2

## 2016-12-04 MED ORDER — CHLORHEXIDINE GLUCONATE 0.12 % MT SOLN
15.0000 mL | OROMUCOSAL | Status: AC
  Administered 2016-12-04: 15 mL via OROMUCOSAL

## 2016-12-04 MED ORDER — FENTANYL CITRATE (PF) 250 MCG/5ML IJ SOLN
INTRAMUSCULAR | Status: DC | PRN
  Administered 2016-12-04: 100 ug via INTRAVENOUS
  Administered 2016-12-04 (×2): 50 ug via INTRAVENOUS
  Administered 2016-12-04: 200 ug via INTRAVENOUS
  Administered 2016-12-04 (×6): 100 ug via INTRAVENOUS
  Administered 2016-12-04: 150 ug via INTRAVENOUS
  Administered 2016-12-04: 100 ug via INTRAVENOUS

## 2016-12-04 MED ORDER — PROPOFOL 10 MG/ML IV BOLUS
INTRAVENOUS | Status: AC
  Filled 2016-12-04: qty 20

## 2016-12-04 MED ORDER — MIDAZOLAM HCL 2 MG/2ML IJ SOLN: 2.0000 mg | INTRAMUSCULAR | Status: DC | PRN

## 2016-12-04 MED ORDER — ROCURONIUM BROMIDE 10 MG/ML (PF) SYRINGE
PREFILLED_SYRINGE | INTRAVENOUS | Status: DC | PRN
  Administered 2016-12-04: 50 mg via INTRAVENOUS

## 2016-12-04 MED ORDER — ROCURONIUM BROMIDE 100 MG/10ML IV SOLN
INTRAVENOUS | Status: DC | PRN
  Administered 2016-12-04 (×2): 25 mg via INTRAVENOUS

## 2016-12-04 MED ORDER — SODIUM CHLORIDE 0.9 % IV SOLN
INTRAVENOUS | Status: DC
  Filled 2016-12-04: qty 2.5

## 2016-12-04 MED ORDER — METOPROLOL TARTRATE 5 MG/5ML IV SOLN: 2.5000 mg | INTRAVENOUS | Status: DC | PRN

## 2016-12-04 MED ORDER — SODIUM CHLORIDE 0.9 % IV SOLN: 250.0000 mL | INTRAVENOUS | Status: DC

## 2016-12-04 MED ORDER — ACETAMINOPHEN 160 MG/5ML PO SOLN: 650.0000 mg | Freq: Once | ORAL | Status: AC

## 2016-12-04 MED ORDER — LACTATED RINGERS IV SOLN
INTRAVENOUS | Status: DC | PRN
  Administered 2016-12-04: 11:00:00 via INTRAVENOUS

## 2016-12-04 MED ORDER — 0.9 % SODIUM CHLORIDE (POUR BTL) OPTIME
TOPICAL | Status: DC | PRN
  Administered 2016-12-04: 6000 mL

## 2016-12-04 MED ORDER — LIDOCAINE 2% (20 MG/ML) 5 ML SYRINGE
INTRAMUSCULAR | Status: AC
  Filled 2016-12-04: qty 5

## 2016-12-04 MED ORDER — PROTAMINE SULFATE 10 MG/ML IV SOLN
INTRAVENOUS | Status: DC | PRN
  Administered 2016-12-04 (×2): 35 mg via INTRAVENOUS
  Administered 2016-12-04: 10 mg via INTRAVENOUS
  Administered 2016-12-04 (×5): 35 mg via INTRAVENOUS
  Administered 2016-12-04: 25 mg via INTRAVENOUS
  Administered 2016-12-04 (×2): 35 mg via INTRAVENOUS

## 2016-12-04 MED ORDER — DEXTROSE 5 % IV SOLN
1.5000 g | Freq: Two times a day (BID) | INTRAVENOUS | Status: AC
  Administered 2016-12-04 – 2016-12-06 (×4): 1.5 g via INTRAVENOUS
  Filled 2016-12-04 (×4): qty 1.5

## 2016-12-04 MED ORDER — HEPARIN SODIUM (PORCINE) 1000 UNIT/ML IJ SOLN
INTRAMUSCULAR | Status: AC
  Filled 2016-12-04: qty 1

## 2016-12-04 MED ORDER — POTASSIUM CHLORIDE 10 MEQ/50ML IV SOLN: 10.0000 meq | INTRAVENOUS | Status: AC

## 2016-12-04 MED ORDER — MAGNESIUM SULFATE 4 GM/100ML IV SOLN
4.0000 g | Freq: Once | INTRAVENOUS | Status: AC
  Administered 2016-12-04: 4 g via INTRAVENOUS
  Filled 2016-12-04: qty 100

## 2016-12-04 MED ORDER — CHLORHEXIDINE GLUCONATE 0.12% ORAL RINSE (MEDLINE KIT)
15.0000 mL | Freq: Two times a day (BID) | OROMUCOSAL | Status: DC
  Administered 2016-12-04: 15 mL via OROMUCOSAL

## 2016-12-04 MED ORDER — ASPIRIN 81 MG PO CHEW: 324.0000 mg | CHEWABLE_TABLET | Freq: Every day | ORAL | Status: DC

## 2016-12-04 MED ORDER — NITROGLYCERIN IN D5W 200-5 MCG/ML-% IV SOLN: 0.0000 ug/min | INTRAVENOUS | Status: DC

## 2016-12-04 MED ORDER — LIDOCAINE 2% (20 MG/ML) 5 ML SYRINGE
INTRAMUSCULAR | Status: DC | PRN
  Administered 2016-12-04: 100 mg via INTRAVENOUS

## 2016-12-04 MED ORDER — ONDANSETRON HCL 4 MG/2ML IJ SOLN
4.0000 mg | Freq: Four times a day (QID) | INTRAMUSCULAR | Status: DC | PRN
  Administered 2016-12-05 – 2017-01-02 (×23): 4 mg via INTRAVENOUS
  Filled 2016-12-04 (×23): qty 2

## 2016-12-04 MED ORDER — SODIUM CHLORIDE 0.45 % IV SOLN
INTRAVENOUS | Status: DC | PRN
Start: 2016-12-04 — End: 2016-12-10

## 2016-12-04 MED ORDER — METOPROLOL TARTRATE 25 MG/10 ML ORAL SUSPENSION: 12.5000 mg | Freq: Two times a day (BID) | ORAL | Status: DC

## 2016-12-04 MED ORDER — ASPIRIN EC 325 MG PO TBEC
325.0000 mg | DELAYED_RELEASE_TABLET | Freq: Every day | ORAL | Status: DC
  Administered 2016-12-05 – 2016-12-08 (×4): 325 mg via ORAL
  Filled 2016-12-04 (×4): qty 1

## 2016-12-04 MED ORDER — ROCURONIUM BROMIDE 10 MG/ML (PF) SYRINGE
PREFILLED_SYRINGE | INTRAVENOUS | Status: AC
  Filled 2016-12-04: qty 5

## 2016-12-04 MED ORDER — FAMOTIDINE IN NACL 20-0.9 MG/50ML-% IV SOLN
20.0000 mg | Freq: Two times a day (BID) | INTRAVENOUS | Status: AC
  Administered 2016-12-04: 20 mg via INTRAVENOUS

## 2016-12-04 MED ORDER — ACETAMINOPHEN 500 MG PO TABS
1000.0000 mg | ORAL_TABLET | Freq: Four times a day (QID) | ORAL | Status: DC
  Administered 2016-12-05 – 2016-12-07 (×9): 1000 mg via ORAL
  Filled 2016-12-04 (×13): qty 2

## 2016-12-04 MED ORDER — ALBUMIN HUMAN 5 % IV SOLN
250.0000 mL | INTRAVENOUS | Status: AC | PRN
  Administered 2016-12-04 – 2016-12-05 (×3): 250 mL via INTRAVENOUS
  Filled 2016-12-04 (×2): qty 250

## 2016-12-04 MED ORDER — SODIUM CHLORIDE 0.9% FLUSH
3.0000 mL | Freq: Two times a day (BID) | INTRAVENOUS | Status: DC
  Administered 2016-12-05 – 2016-12-10 (×9): 3 mL via INTRAVENOUS

## 2016-12-04 MED ORDER — DOPAMINE-DEXTROSE 1.6-5 MG/ML-% IV SOLN
INTRAVENOUS | Status: DC | PRN
  Administered 2016-12-04: 3 ug/kg/min via INTRAVENOUS

## 2016-12-04 MED ORDER — ACETAMINOPHEN 650 MG RE SUPP
650.0000 mg | Freq: Once | RECTAL | Status: AC
Start: 2016-12-04 — End: 2016-12-04
  Administered 2016-12-04: 650 mg via RECTAL

## 2016-12-04 MED ORDER — SODIUM CHLORIDE 0.9 % IV SOLN
INTRAVENOUS | Status: DC
  Administered 2016-12-07: 15:00:00 via INTRAVENOUS

## 2016-12-04 MED ORDER — OXYCODONE HCL 5 MG PO TABS
5.0000 mg | ORAL_TABLET | ORAL | Status: DC | PRN
  Administered 2016-12-05 – 2016-12-06 (×3): 10 mg via ORAL
  Administered 2016-12-06 – 2016-12-15 (×3): 5 mg via ORAL
  Administered 2016-12-21 – 2017-01-01 (×2): 10 mg via ORAL
  Filled 2016-12-04 (×3): qty 2
  Filled 2016-12-04 (×3): qty 1
  Filled 2016-12-04 (×4): qty 2

## 2016-12-04 MED ORDER — HEMOSTATIC AGENTS (NO CHARGE) OPTIME
TOPICAL | Status: DC | PRN
  Administered 2016-12-04: 1 via TOPICAL

## 2016-12-04 MED ORDER — SODIUM CHLORIDE 0.9% FLUSH
3.0000 mL | INTRAVENOUS | Status: DC | PRN
Start: 2016-12-05 — End: 2016-12-10

## 2016-12-04 MED ORDER — SODIUM CHLORIDE 0.9 % IV SOLN
0.0000 ug/min | INTRAVENOUS | Status: DC
  Administered 2016-12-05 – 2016-12-07 (×2): 15 ug/min via INTRAVENOUS
  Filled 2016-12-04 (×3): qty 2

## 2016-12-04 MED ORDER — GELATIN ABSORBABLE MT POWD
OROMUCOSAL | Status: DC | PRN
  Administered 2016-12-04 (×3): 4 mL via TOPICAL

## 2016-12-04 MED ORDER — LACTATED RINGERS IV SOLN
INTRAVENOUS | Status: DC
Start: 2016-12-04 — End: 2016-12-10

## 2016-12-04 MED ORDER — ACETAMINOPHEN 160 MG/5ML PO SOLN: 1000.0000 mg | Freq: Four times a day (QID) | ORAL | Status: DC

## 2016-12-04 MED ORDER — PHENYLEPHRINE HCL 10 MG/ML IJ SOLN
INTRAVENOUS | Status: DC | PRN
  Administered 2016-12-04: 15 ug/min via INTRAVENOUS

## 2016-12-04 MED ORDER — PANTOPRAZOLE SODIUM 40 MG PO TBEC
40.0000 mg | DELAYED_RELEASE_TABLET | Freq: Every day | ORAL | Status: DC
  Administered 2016-12-06 – 2016-12-12 (×7): 40 mg via ORAL
  Filled 2016-12-04 (×7): qty 1

## 2016-12-04 MED ORDER — LACTATED RINGERS IV SOLN: INTRAVENOUS | Status: DC

## 2016-12-04 MED ORDER — MORPHINE SULFATE (PF) 4 MG/ML IV SOLN
2.0000 mg | INTRAVENOUS | Status: DC | PRN
  Administered 2016-12-04: 4 mg via INTRAVENOUS
  Administered 2016-12-05: 2 mg via INTRAVENOUS
  Administered 2016-12-05 (×2): 4 mg via INTRAVENOUS
  Filled 2016-12-04 (×4): qty 1

## 2016-12-04 MED ORDER — EPHEDRINE SULFATE 50 MG/ML IJ SOLN
INTRAMUSCULAR | Status: DC | PRN
  Administered 2016-12-04: 10 mg via INTRAVENOUS
  Administered 2016-12-04: 5 mg via INTRAVENOUS

## 2016-12-04 MED ORDER — SODIUM CHLORIDE 0.9 % IV SOLN
0.0000 ug/kg/h | INTRAVENOUS | Status: DC
  Filled 2016-12-04: qty 2

## 2016-12-04 MED ORDER — LACTATED RINGERS IV SOLN: 500.0000 mL | Freq: Once | INTRAVENOUS | Status: DC | PRN

## 2016-12-04 MED ORDER — DOPAMINE-DEXTROSE 3.2-5 MG/ML-% IV SOLN
3.0000 ug/kg/min | INTRAVENOUS | Status: DC
  Administered 2016-12-04: 3 ug/kg/min via INTRAVENOUS
  Filled 2016-12-04: qty 250

## 2016-12-04 MED ORDER — MIDAZOLAM HCL 10 MG/2ML IJ SOLN
INTRAMUSCULAR | Status: AC
  Filled 2016-12-04: qty 2

## 2016-12-04 MED ORDER — PROPOFOL 10 MG/ML IV BOLUS
INTRAVENOUS | Status: DC | PRN
  Administered 2016-12-04 (×3): 30 mg via INTRAVENOUS

## 2016-12-04 MED ORDER — MIDAZOLAM HCL 5 MG/5ML IJ SOLN
INTRAMUSCULAR | Status: DC | PRN
  Administered 2016-12-04 (×2): 2 mg via INTRAVENOUS
  Administered 2016-12-04 (×2): 1 mg via INTRAVENOUS
  Administered 2016-12-04: 2 mg via INTRAVENOUS
  Administered 2016-12-04 (×2): 1 mg via INTRAVENOUS

## 2016-12-04 MED ORDER — BISACODYL 5 MG PO TBEC
10.0000 mg | DELAYED_RELEASE_TABLET | Freq: Every day | ORAL | Status: DC
  Administered 2016-12-05 – 2016-12-15 (×10): 10 mg via ORAL
  Filled 2016-12-04 (×11): qty 2

## 2016-12-04 MED ORDER — TRAMADOL HCL 50 MG PO TABS
50.0000 mg | ORAL_TABLET | ORAL | Status: DC | PRN
  Administered 2016-12-09 – 2016-12-17 (×5): 100 mg via ORAL
  Administered 2016-12-19 – 2016-12-22 (×2): 50 mg via ORAL
  Filled 2016-12-04 (×6): qty 2
  Filled 2016-12-04: qty 1
  Filled 2016-12-04: qty 2
  Filled 2016-12-04 (×2): qty 1

## 2016-12-04 MED ORDER — INSULIN REGULAR BOLUS VIA INFUSION
0.0000 [IU] | Freq: Three times a day (TID) | INTRAVENOUS | Status: DC
  Filled 2016-12-04: qty 10

## 2016-12-04 MED ORDER — ORAL CARE MOUTH RINSE
15.0000 mL | Freq: Two times a day (BID) | OROMUCOSAL | Status: DC
  Administered 2016-12-04: 15 mL via OROMUCOSAL

## 2016-12-04 MED ORDER — VANCOMYCIN HCL IN DEXTROSE 1-5 GM/200ML-% IV SOLN
1000.0000 mg | Freq: Once | INTRAVENOUS | Status: AC
  Administered 2016-12-04: 1000 mg via INTRAVENOUS
  Filled 2016-12-04: qty 200

## 2016-12-04 MED ORDER — ETOMIDATE 2 MG/ML IV SOLN
INTRAVENOUS | Status: AC
  Filled 2016-12-04: qty 10

## 2016-12-04 MED ORDER — FENTANYL CITRATE (PF) 250 MCG/5ML IJ SOLN
INTRAMUSCULAR | Status: AC
  Filled 2016-12-04: qty 25

## 2016-12-04 MED ORDER — BISACODYL 10 MG RE SUPP: 10.0000 mg | Freq: Every day | RECTAL | Status: DC

## 2016-12-04 MED FILL — Heparin Sodium (Porcine) Inj 1000 Unit/ML: INTRAMUSCULAR | Qty: 30 | Status: AC

## 2016-12-04 MED FILL — Magnesium Sulfate Inj 50%: INTRAMUSCULAR | Qty: 2 | Status: AC

## 2016-12-04 MED FILL — Potassium Chloride Inj 2 mEq/ML: INTRAVENOUS | Qty: 5 | Status: AC

## 2016-12-04 SURGICAL SUPPLY — 103 items
ADH SKN CLS APL DERMABOND .7 (GAUZE/BANDAGES/DRESSINGS) ×3
ATRICLIP EXCLUSION 45 FLEX HDL (Clip) ×3 IMPLANT
BAG DECANTER FOR FLEXI CONT (MISCELLANEOUS) ×5 IMPLANT
BANDAGE ACE 4X5 VEL STRL LF (GAUZE/BANDAGES/DRESSINGS) ×2 IMPLANT
BANDAGE ACE 6X5 VEL STRL LF (GAUZE/BANDAGES/DRESSINGS) ×3 IMPLANT
BANDAGE ELASTIC 4 VELCRO ST LF (GAUZE/BANDAGES/DRESSINGS) ×3 IMPLANT
BANDAGE ELASTIC 6 VELCRO ST LF (GAUZE/BANDAGES/DRESSINGS) ×3 IMPLANT
BASKET HEART  (ORDER IN 25'S) (MISCELLANEOUS) ×1
BASKET HEART (ORDER IN 25'S) (MISCELLANEOUS) ×1
BASKET HEART (ORDER IN 25S) (MISCELLANEOUS) ×3 IMPLANT
BLADE STERNUM SYSTEM 6 (BLADE) ×5 IMPLANT
BLADE SURG 15 STRL LF DISP TIS (BLADE) IMPLANT
BLADE SURG 15 STRL SS (BLADE) ×5
BNDG GAUZE ELAST 4 BULKY (GAUZE/BANDAGES/DRESSINGS) ×5 IMPLANT
CANISTER SUCT 3000ML PPV (MISCELLANEOUS) ×5 IMPLANT
CANNULA EZ GLIDE AORTIC 21FR (CANNULA) ×5 IMPLANT
CANNULA GUNDRY RCSP 15FR (MISCELLANEOUS) ×2 IMPLANT
CANNULA SUMP PERICARDIAL (CANNULA) ×2 IMPLANT
CARDIOBLATE CARDIAC ABLATION (MISCELLANEOUS)
CATH CPB KIT HENDRICKSON (MISCELLANEOUS) ×5 IMPLANT
CATH ROBINSON RED A/P 18FR (CATHETERS) ×5 IMPLANT
CATH THORACIC 36FR (CATHETERS) ×5 IMPLANT
CATH THORACIC 36FR RT ANG (CATHETERS) ×5 IMPLANT
CLAMP ISOLATOR SYNERGY LG (MISCELLANEOUS) ×2 IMPLANT
CLIP FOGARTY SPRING 6M (CLIP) ×4 IMPLANT
CLIP TI MEDIUM 24 (CLIP) IMPLANT
CLIP TI WIDE RED SMALL 24 (CLIP) ×6 IMPLANT
CONN 3/8X1/2 ST GISH (MISCELLANEOUS) ×2 IMPLANT
CRADLE DONUT ADULT HEAD (MISCELLANEOUS) ×5 IMPLANT
DERMABOND ADVANCED (GAUZE/BANDAGES/DRESSINGS) ×2
DERMABOND ADVANCED .7 DNX12 (GAUZE/BANDAGES/DRESSINGS) ×2 IMPLANT
DEVICE CARDIOBLATE CARDIAC ABL (MISCELLANEOUS) IMPLANT
DRAPE CARDIOVASCULAR INCISE (DRAPES) ×5
DRAPE SLUSH/WARMER DISC (DRAPES) ×5 IMPLANT
DRAPE SRG 135X102X78XABS (DRAPES) ×3 IMPLANT
DRSG COVADERM 4X14 (GAUZE/BANDAGES/DRESSINGS) ×5 IMPLANT
ELECT REM PT RETURN 9FT ADLT (ELECTROSURGICAL) ×10
ELECTRODE REM PT RTRN 9FT ADLT (ELECTROSURGICAL) ×6 IMPLANT
FELT TEFLON 1X6 (MISCELLANEOUS) ×8 IMPLANT
GAUZE SPONGE 4X4 12PLY STRL (GAUZE/BANDAGES/DRESSINGS) ×6 IMPLANT
GAUZE SPONGE 4X4 12PLY STRL LF (GAUZE/BANDAGES/DRESSINGS) ×8 IMPLANT
GLOVE SURG SIGNA 7.5 PF LTX (GLOVE) ×15 IMPLANT
GOWN STRL REUS W/ TWL LRG LVL3 (GOWN DISPOSABLE) ×12 IMPLANT
GOWN STRL REUS W/ TWL XL LVL3 (GOWN DISPOSABLE) ×6 IMPLANT
GOWN STRL REUS W/TWL LRG LVL3 (GOWN DISPOSABLE) ×20
GOWN STRL REUS W/TWL XL LVL3 (GOWN DISPOSABLE) ×10
HEMOSTAT POWDER SURGIFOAM 1G (HEMOSTASIS) ×15 IMPLANT
HEMOSTAT SURGICEL 2X14 (HEMOSTASIS) ×5 IMPLANT
INSERT FOGARTY XLG (MISCELLANEOUS) IMPLANT
KIT BASIN OR (CUSTOM PROCEDURE TRAY) ×5 IMPLANT
KIT ROOM TURNOVER OR (KITS) ×5 IMPLANT
KIT SUCTION CATH 14FR (SUCTIONS) ×10 IMPLANT
KIT VASOVIEW HEMOPRO VH 3000 (KITS) ×5 IMPLANT
LINE VENT (MISCELLANEOUS) ×3 IMPLANT
LOOP VESSEL SUPERMAXI WHITE (MISCELLANEOUS) ×3 IMPLANT
MARKER GRAFT CORONARY BYPASS (MISCELLANEOUS) ×15 IMPLANT
NS IRRIG 1000ML POUR BTL (IV SOLUTION) ×27 IMPLANT
PACK OPEN HEART (CUSTOM PROCEDURE TRAY) ×5 IMPLANT
PAD ARMBOARD 7.5X6 YLW CONV (MISCELLANEOUS) ×10 IMPLANT
PAD ELECT DEFIB RADIOL ZOLL (MISCELLANEOUS) ×5 IMPLANT
PENCIL BUTTON HOLSTER BLD 10FT (ELECTRODE) ×5 IMPLANT
PROBE CRYO2-ABLATION MALLABLE (MISCELLANEOUS) IMPLANT
PUNCH AORTIC ROTATE 4.0MM (MISCELLANEOUS) IMPLANT
PUNCH AORTIC ROTATE 4.5MM 8IN (MISCELLANEOUS) IMPLANT
PUNCH AORTIC ROTATE 5MM 8IN (MISCELLANEOUS) IMPLANT
SET CARDIOPLEGIA MPS 5001102 (MISCELLANEOUS) ×3 IMPLANT
SUT BONE WAX W31G (SUTURE) ×5 IMPLANT
SUT ETHIBOND 2 0 SH (SUTURE) ×10
SUT ETHIBOND 2 0 SH 36X2 (SUTURE) IMPLANT
SUT MNCRL AB 4-0 PS2 18 (SUTURE) IMPLANT
SUT PROLENE 3 0 SH DA (SUTURE) ×7 IMPLANT
SUT PROLENE 4 0 RB 1 (SUTURE) ×5
SUT PROLENE 4 0 SH DA (SUTURE) ×3 IMPLANT
SUT PROLENE 4-0 RB1 .5 CRCL 36 (SUTURE) IMPLANT
SUT PROLENE 5 0 C 1 36 (SUTURE) ×2 IMPLANT
SUT PROLENE 6 0 C 1 30 (SUTURE) ×10 IMPLANT
SUT PROLENE 7 0 BV1 MDA (SUTURE) ×7 IMPLANT
SUT PROLENE 8 0 BV175 6 (SUTURE) ×3 IMPLANT
SUT STEEL 6MS V (SUTURE) ×5 IMPLANT
SUT STEEL STERNAL CCS#1 18IN (SUTURE) IMPLANT
SUT STEEL SZ 6 DBL 3X14 BALL (SUTURE) ×5 IMPLANT
SUT VIC AB 1 CTX 36 (SUTURE) ×10
SUT VIC AB 1 CTX36XBRD ANBCTR (SUTURE) ×6 IMPLANT
SUT VIC AB 2-0 CT1 27 (SUTURE) ×5
SUT VIC AB 2-0 CT1 TAPERPNT 27 (SUTURE) IMPLANT
SUT VIC AB 2-0 CTX 27 (SUTURE) IMPLANT
SUT VIC AB 3-0 SH 27 (SUTURE)
SUT VIC AB 3-0 SH 27X BRD (SUTURE) IMPLANT
SUT VIC AB 3-0 X1 27 (SUTURE) ×3 IMPLANT
SUT VICRYL 4-0 PS2 18IN ABS (SUTURE) IMPLANT
SUTURE E-PAK OPEN HEART (SUTURE) ×5 IMPLANT
SYSTEM SAHARA CHEST DRAIN ATS (WOUND CARE) ×5 IMPLANT
TAPE CLOTH SURG 4X10 WHT LF (GAUZE/BANDAGES/DRESSINGS) ×4 IMPLANT
TAPE PAPER 3X10 WHT MICROPORE (GAUZE/BANDAGES/DRESSINGS) ×3 IMPLANT
TOWEL GREEN STERILE (TOWEL DISPOSABLE) ×11 IMPLANT
TOWEL GREEN STERILE FF (TOWEL DISPOSABLE) ×7 IMPLANT
TOWEL OR 17X24 6PK STRL BLUE (TOWEL DISPOSABLE) ×6 IMPLANT
TOWEL OR 17X26 10 PK STRL BLUE (TOWEL DISPOSABLE) ×4 IMPLANT
TRAY FOLEY SILVER 16FR TEMP (SET/KITS/TRAYS/PACK) ×5 IMPLANT
TUBE FEEDING 8FR 16IN STR KANG (MISCELLANEOUS) ×5 IMPLANT
TUBING INSUFFLATION (TUBING) ×5 IMPLANT
UNDERPAD 30X30 (UNDERPADS AND DIAPERS) ×5 IMPLANT
WATER STERILE IRR 1000ML POUR (IV SOLUTION) ×10 IMPLANT

## 2016-12-04 NOTE — Progress Notes (Signed)
Patient flipped to CPAP mode. Tolerating well at this time. RT will continue to monitor.

## 2016-12-04 NOTE — H&P (View-Only) (Signed)
5 Days Post-Op Procedure(s) (LRB): Left Heart Cath and Coronary Angiography (N/A) Subjective: No complaints this morning  Objective: Vital signs in last 24 hours: Temp:  [97.8 F (36.6 C)-98.5 F (36.9 C)] 98.1 F (36.7 C) (05/02 0749) Pulse Rate:  [60-91] 60 (05/02 0749) Cardiac Rhythm: Atrial paced (05/01 2100) Resp:  [18-20] 18 (05/02 0749) BP: (98-156)/(44-79) 106/47 (05/02 0749) SpO2:  [91 %-98 %] 96 % (05/02 0749) Weight:  [172 lb 14.4 oz (78.4 kg)] 172 lb 14.4 oz (78.4 kg) (05/02 0408)  Hemodynamic parameters for last 24 hours:    Intake/Output from previous day: 05/01 0701 - 05/02 0700 In: 1050 [P.O.:900; I.V.:150] Out: 2000 [Urine:2000] Intake/Output this shift: No intake/output data recorded.  General appearance: alert, cooperative and no distress Neurologic: intact Heart: regular rate and rhythm Lungs: diminished breath sounds bibasilar  Lab Results:  Recent Labs  12/02/16 0315 12/03/16 0249  WBC 8.9 9.3  HGB 9.4* 9.4*  HCT 30.9* 30.6*  PLT 322 345   BMET:  Recent Labs  12/01/16 0304 12/02/16 0315  NA 140 138  K 4.6 3.8  CL 105 105  CO2 27 25  GLUCOSE 128* 132*  BUN 31* 23*  CREATININE 1.25* 1.08*  CALCIUM 9.3 8.9    PT/INR: No results for input(s): LABPROT, INR in the last 72 hours. ABG    Component Value Date/Time   PHART 7.388 04/14/2015 0337   HCO3 24.7 (H) 04/14/2015 0337   TCO2 26.0 04/14/2015 0337   O2SAT 95.5 04/14/2015 0337   CBG (last 3)   Recent Labs  12/02/16 1714 12/02/16 2135 12/03/16 0746  GLUCAP 121* 152* 115*    Assessment/Plan: S/P Procedure(s) (LRB): Left Heart Cath and Coronary Angiography (N/A) -For CABG and left atrial clip in AM. I discussed possibly doing Maze procedure as well depending on intraoperative findings and she is agreeable to that. I will only do Maze if I feel it can be done without unduly increasing risk of procedure. Will also assess mitral and tricuspid valves intraoperatively. I do not  think her MR is significant enough to warrant repair based on the preop echo, but will make final decision based on TEE. She has severe TR but with ICD lead across TV there is not much to be done about it.  She is aware of the indications, risks, benefits and alternatives. She accepts the risks and agrees to proceed.  For OR tomorrow   LOS: 6 days    Melrose Nakayama 12/03/2016

## 2016-12-04 NOTE — Interval H&P Note (Signed)
History and Physical Interval Note:  12/04/2016 10:54 AM  [name]  has presented today for surgery, with the diagnosis of CAD  The various methods of treatment have been discussed with the patient and family. After consideration of risks, benefits and other options for treatment, the patient has consented to  Procedure(s): CORONARY ARTERY BYPASS GRAFTING (CABG) (N/A) CLIPPING OF ATRIAL APPENDAGE (Left) INTRAOPERATIVE TRANSESOPHAGEAL ECHOCARDIOGRAM (N/A) POSSIBLE MAZE (N/A) as a surgical intervention .  The patient's history has been reviewed, patient examined, no change in status, stable for surgery.  I have reviewed the patient's chart and labs.  Questions were answered to the patient's satisfaction.     Melrose Nakayama

## 2016-12-04 NOTE — Care Management Important Message (Signed)
Important Message  Patient Details  Name: [name] MRN: [id number] Date of Birth: 19-Oct-1945   Medicare Important Message Given:  Yes    Nathen May 12/04/2016, 10:39 AM

## 2016-12-04 NOTE — Progress Notes (Signed)
ANTICOAGULATION CONSULT NOTE - Follow Up Consult  Pharmacy Consult for Heparin (Xarelto on hold) Indication: chest pain/ACS and atrial fibrillation, plan for CABG 5/3  Allergies  Allergen Reactions  . No Known Allergies     Patient Measurements: Height: 5\' 4"  (162.6 cm) Weight: 171 lb 1.6 oz (77.6 kg) IBW/kg (Calculated) : 54.7  Vital Signs: Temp: 98.2 F (36.8 C) (05/03 0802) Temp Source: Oral (05/03 0802) BP: 136/55 (05/03 0802) Pulse Rate: 65 (05/03 0802)  Labs:  Recent Labs  12/02/16 0315 12/03/16 0249 12/03/16 0805 12/04/16 0245  HGB 9.4* 9.4*  --  9.4*  HCT 30.9* 30.6*  --  30.2*  PLT 322 345  --  314  HEPARINUNFRC 0.53 0.55  --  0.65  CREATININE 1.08*  --  1.17* 1.17*    Estimated Creatinine Clearance: 46.4 mL/min (A) (by C-G formula based on SCr of 1.17 mg/dL (H)).  Assessment: Heparin for afib + ACS while awaiting CABG on 5/3. Using aPTT to dose heparin for now given Xarelto influence on anti-Xa levels (last dose 4/25).  HL remains therapeutic at 0.65 on heparin 1250 units/hr. Hgb remains low but stable, plt wnl. No issues with infusion or bleeding noted  Goal of Therapy:  Heparin level 0.3-0.7 units/ml Monitor platelets by anticoagulation protocol: Yes   Plan:  Continue heparin 1250 units/hr Daily HL/CBC Monitor s/sx of bleeding  CABG today  Andrey Cota. Diona Foley, PharmD, BCPS Clinical Pharmacist [phone] 12/04/2016 8:22 AM

## 2016-12-04 NOTE — OR Nursing (Signed)
[place] with [employer] in to turn off pt's ICD. PCM set at 60 bpm.

## 2016-12-04 NOTE — Progress Notes (Signed)
Rapid Wean Protocol begun. RT and RN will continue to monitor.

## 2016-12-04 NOTE — Progress Notes (Signed)
Recruitment maneuver preformed for 2 minutes due to desat of 88%.  Sat recovered to 99%, vent returned to previous settings, MD and RNs at bedside.  PC 35 RR 10 Peep 5 iT 3.00 100%

## 2016-12-04 NOTE — Progress Notes (Signed)
      [address]       [place] [address]             [phone]      s/p CABG x 5, pulmonary vein isolation, maze  Intubated, sedated  BP 127/64   Pulse 99   Temp 98.2 F (36.8 C) (Oral)   Resp 12   Ht 5\' 4"  (1.626 m)   Wt 171 lb 1.6 oz (77.6 kg)   SpO2 100%   BMI 29.37 kg/m   CI= 2.1 on dopamine at 3   Intake/Output Summary (Last 24 hours) at 12/04/16 1815 Last data filed at 12/04/16 1704  Gross per 24 hour  Intake             2160 ml  Output             3005 ml  Net             -845 ml   Hgb= 10, creatinine 1.1  Doing well early postop  [name], MD [facility] [phone]

## 2016-12-04 NOTE — Anesthesia Preprocedure Evaluation (Addendum)
Anesthesia Evaluation  Patient identified by MRN, date of birth, ID band Patient awake    Reviewed: Allergy & Precautions, NPO status , Patient's Chart, lab work & pertinent test results  History of Anesthesia Complications Negative for: history of anesthetic complications  Airway Mallampati: II  TM Distance: >3 FB Neck ROM: Full    Dental no notable dental hx. (+) Dental Advisory Given   Pulmonary former smoker,    Pulmonary exam normal        Cardiovascular + CAD, + Past MI and +CHF  Normal cardiovascular exam+ dysrhythmias Atrial Fibrillation and Ventricular Tachycardia + Cardiac Defibrillator + Valvular Problems/Murmurs MR   Impressions:  - Since the prior study on 09/20/2015 LVEF still has moderate   systolic dysfunction at 46-50%.   There is new severe tricuspid regurgitation and severe pulmonary   hypertension with RVSP 87 mmHg.   Neuro/Psych negative neurological ROS  negative psych ROS   GI/Hepatic Neg liver ROS,   Endo/Other  diabetesHypothyroidism   Renal/GU Renal InsufficiencyRenal disease     Musculoskeletal   Abdominal   Peds  Hematology   Anesthesia Other Findings   Reproductive/Obstetrics                            Anesthesia Physical Anesthesia Plan  ASA: IV  Anesthesia Plan: General   Post-op Pain Management:    Induction: Intravenous  Airway Management Planned: Oral ETT  Additional Equipment: Arterial line, 3D TEE, PA Cath and Ultrasound Guidance Line Placement  Intra-op Plan:   Post-operative Plan: Post-operative intubation/ventilation  Informed Consent: I have reviewed the patients History and Physical, chart, labs and discussed the procedure including the risks, benefits and alternatives for the proposed anesthesia with the patient or authorized representative who has indicated his/her understanding and acceptance.   Dental advisory given  Plan  Discussed with: CRNA, Anesthesiologist and Surgeon  Anesthesia Plan Comments:         Anesthesia Quick Evaluation

## 2016-12-04 NOTE — Procedures (Signed)
Extubation Procedure Note  Patient Details:   Name: [name] DOB: 05/22/1946 MRN: [id number]   Airway Documentation:  Airway 7.5 mm (Active)  Secured at (cm) 22 cm 12/04/2016 10:05 PM  Measured From Lips 12/04/2016 10:05 PM  Secured Location Right 12/04/2016 10:05 PM  Secured By Pink Tape 12/04/2016 10:05 PM  Tube Holder Repositioned Yes 12/04/2016 10:05 PM  Site Condition Dry 12/04/2016 10:05 PM    Evaluation  O2 sats: stable throughout Complications: No apparent complications Patient did tolerate procedure well. Bilateral Breath Sounds: Clear, Diminished   Yes  [name] 12/04/2016, 10:39 PM   Patient performed a NIF of -25, and FVC of 631ml/cc and cuff leak was present prior to extubation. RT extubated patient to 4L nasal cannula without complication. RT will continue to monitor.

## 2016-12-04 NOTE — Brief Op Note (Addendum)
11/27/2016 - 12/04/2016  3:16 PM  PATIENT:  [name]  71 y.o. female  PRE-OPERATIVE DIAGNOSIS:  3 VESSEL CAD, paroxysmal atrial fibrillation  POST-OPERATIVE DIAGNOSIS: 3 VESSEL CAD, paroxysmal atrial fibrillation  PROCEDURE:  Procedure(s): CORONARY ARTERY BYPASS GRAFTING (CABG)x 4 WITH ENDOSCOPIC HARVESTING OF RIGHT SAPHENOUS VEIN (N/A) CLIPPING OF ATRIAL APPENDAGE USING ATRICURE 48 ATRICLIP (Left) INTRAOPERATIVE TRANSESOPHAGEAL ECHOCARDIOGRAM (N/A) MAZE PROCEDURE (N/A) Pulmonary vein isolation with RF  SURGEON:  Surgeon(s) and Role:    * Melrose Nakayama, MD - Primary  PHYSICIAN ASSISTANT: WAYNE GOLD PA-C  ANESTHESIA:   general  EBL:  Total I/O In: 900 [I.V.:900] Out: 855 [Urine:855]  BLOOD ADMINISTERED:none  DRAINS: ROUTINE   LOCAL MEDICATIONS USED:  NONE  SPECIMEN:  No Specimen  DISPOSITION OF SPECIMEN:  N/A  COUNTS:  YES  PLAN OF CARE: Admit to inpatient   PATIENT DISPOSITION:  ICU - intubated and hemodynamically stable.   Delay start of Pharmacological VTE agent (>24hrs) due to surgical blood loss or risk of bleeding: yes  COMPLICATIONS: NO KNOWN  XC= 80 min CPB= 115 min, off with 3 mcg/kg/min of dopamine  TEE- EF 40-45%, mild MR, mod TR

## 2016-12-04 NOTE — Transfer of Care (Signed)
Immediate Anesthesia Transfer of Care Note  Patient: [name]  Procedure(s) Performed: Procedure(s): CORONARY ARTERY BYPASS GRAFTING (CABG)x 4 WITH ENDOSCOPIC HARVESTING OF RIGHT SAPHENOUS VEIN (N/A) CLIPPING OF ATRIAL APPENDAGE USING ATRICURE 71 ATRICLIP (Left) INTRAOPERATIVE TRANSESOPHAGEAL ECHOCARDIOGRAM (N/A) MAZE PROCEDURE (N/A)  Patient Location: SICU  Anesthesia Type:General  Level of Consciousness: sedated and Patient remains intubated per anesthesia plan  Airway & Oxygen Therapy: Patient remains intubated per anesthesia plan  Post-op Assessment: Report given to RN and Post -op Vital signs reviewed and stable  Post vital signs: Reviewed and stable  Last Vitals:  Vitals:   12/04/16 0447 12/04/16 0802  BP: 125/71 (!) 136/55  Pulse: 65 65  Resp: 12 14  Temp: 36.8 C 36.8 C    Last Pain:  Vitals:   12/04/16 0930  TempSrc:   PainSc: 0-No pain      Patients Stated Pain Goal: 0 (01/60/10 9323)  Complications: No apparent anesthesia complications

## 2016-12-04 NOTE — Anesthesia Procedure Notes (Signed)
Procedure Name: Intubation Date/Time: 12/04/2016 11:45 AM Performed by: Everlean Cherry A Pre-anesthesia Checklist: Patient identified, Emergency Drugs available, Suction available and Patient being monitored Patient Re-evaluated:Patient Re-evaluated prior to inductionOxygen Delivery Method: Circle system utilized Preoxygenation: Pre-oxygenation with 100% oxygen Intubation Type: IV induction Ventilation: Mask ventilation without difficulty Laryngoscope Size: Miller and 2 Grade View: Grade II Tube type: Oral Tube size: 7.5 mm Number of attempts: 1 Airway Equipment and Method: Stylet Placement Confirmation: ETT inserted through vocal cords under direct vision,  positive ETCO2 and breath sounds checked- equal and bilateral Secured at: 22 cm Tube secured with: Tape Dental Injury: Teeth and Oropharynx as per pre-operative assessment

## 2016-12-04 NOTE — Progress Notes (Signed)
Patient anxious and unable to fall asleep, MD notified and sleep aid ordered.

## 2016-12-05 ENCOUNTER — Encounter (HOSPITAL_COMMUNITY): Payer: Self-pay | Admitting: Thoracic Surgery (Cardiothoracic Vascular Surgery)

## 2016-12-05 ENCOUNTER — Inpatient Hospital Stay (HOSPITAL_COMMUNITY): Payer: PPO

## 2016-12-05 DIAGNOSIS — L8990 Pressure ulcer of unspecified site, unspecified stage: Secondary | ICD-10-CM | POA: Insufficient documentation

## 2016-12-05 LAB — GLUCOSE, CAPILLARY
GLUCOSE-CAPILLARY: 120 mg/dL — AB (ref 65–99)
GLUCOSE-CAPILLARY: 105 mg/dL — AB (ref 65–99)
GLUCOSE-CAPILLARY: 93 mg/dL (ref 65–99)
GLUCOSE-CAPILLARY: 86 mg/dL (ref 65–99)
GLUCOSE-CAPILLARY: 79 mg/dL (ref 65–99)
GLUCOSE-CAPILLARY: 100 mg/dL — AB (ref 65–99)
GLUCOSE-CAPILLARY: 112 mg/dL — AB (ref 65–99)
Glucose-Capillary: 104 mg/dL — ABNORMAL HIGH (ref 65–99)
Glucose-Capillary: 106 mg/dL — ABNORMAL HIGH (ref 65–99)
Glucose-Capillary: 107 mg/dL — ABNORMAL HIGH (ref 65–99)
Glucose-Capillary: 107 mg/dL — ABNORMAL HIGH (ref 65–99)
Glucose-Capillary: 129 mg/dL — ABNORMAL HIGH (ref 65–99)
Glucose-Capillary: 142 mg/dL — ABNORMAL HIGH (ref 65–99)
Glucose-Capillary: 95 mg/dL (ref 65–99)
Glucose-Capillary: 96 mg/dL (ref 65–99)
Glucose-Capillary: 96 mg/dL (ref 65–99)
Glucose-Capillary: 97 mg/dL (ref 65–99)

## 2016-12-05 LAB — BASIC METABOLIC PANEL
Anion gap: 10 (ref 5–15)
BUN: 20 mg/dL (ref 6–20)
CALCIUM: 8.3 mg/dL — AB (ref 8.9–10.3)
CO2: 26 mmol/L (ref 22–32)
Chloride: 102 mmol/L (ref 101–111)
Creatinine, Ser: 1.17 mg/dL — ABNORMAL HIGH (ref 0.44–1.00)
GFR, EST AFRICAN AMERICAN: 54 mL/min — AB (ref >60)
GFR, EST NON AFRICAN AMERICAN: 47 mL/min — AB (ref >60)
Glucose, Bld: 110 mg/dL — ABNORMAL HIGH (ref 65–99)
Potassium: 4.2 mmol/L (ref 3.5–5.1)
SODIUM: 138 mmol/L (ref 135–145)

## 2016-12-05 LAB — POCT I-STAT 4, (NA,K, GLUC, HGB,HCT)
Glucose, Bld: 122 mg/dL — ABNORMAL HIGH (ref 65–99)
HCT: 30 % — ABNORMAL LOW (ref 36.0–46.0)
Hemoglobin: 10.2 g/dL — ABNORMAL LOW (ref 12.0–15.0)
POTASSIUM: 4.2 mmol/L (ref 3.5–5.1)
SODIUM: 138 mmol/L (ref 135–145)

## 2016-12-05 LAB — CREATININE, SERUM
CREATININE: 1.17 mg/dL — AB (ref 0.44–1.00)
GFR, EST AFRICAN AMERICAN: 54 mL/min — AB (ref >60)
GFR, EST NON AFRICAN AMERICAN: 47 mL/min — AB (ref >60)

## 2016-12-05 LAB — POCT I-STAT 3, ART BLOOD GAS (G3+)
ACID-BASE DEFICIT: 1 mmol/L (ref 0.0–2.0)
ACID-BASE DEFICIT: 5 mmol/L — AB (ref 0.0–2.0)
ACID-BASE EXCESS: 1 mmol/L (ref 0.0–2.0)
BICARBONATE: 24 mmol/L (ref 20.0–28.0)
BICARBONATE: 27.5 mmol/L (ref 20.0–28.0)
Bicarbonate: 20.8 mmol/L (ref 20.0–28.0)
O2 SAT: 95 %
O2 SAT: 96 %
O2 Saturation: 94 %
PCO2 ART: 38.4 mmHg (ref 32.0–48.0)
PO2 ART: 82 mmHg — AB (ref 83.0–108.0)
TCO2: 22 mmol/L (ref 0–100)
TCO2: 25 mmol/L (ref 0–100)
TCO2: 29 mmol/L (ref 0–100)
pCO2 arterial: 41.5 mmHg (ref 32.0–48.0)
pCO2 arterial: 55.1 mmHg — ABNORMAL HIGH (ref 32.0–48.0)
pH, Arterial: 7.306 — ABNORMAL LOW (ref 7.350–7.450)
pH, Arterial: 7.341 — ABNORMAL LOW (ref 7.350–7.450)
pH, Arterial: 7.369 (ref 7.350–7.450)
pO2, Arterial: 71 mmHg — ABNORMAL LOW (ref 83.0–108.0)
pO2, Arterial: 87 mmHg (ref 83.0–108.0)

## 2016-12-05 LAB — CBC
HCT: 29.3 % — ABNORMAL LOW (ref 36.0–46.0)
HCT: 25.6 % — ABNORMAL LOW (ref 36.0–46.0)
HEMOGLOBIN: 7.7 g/dL — AB (ref 12.0–15.0)
Hemoglobin: 8.8 g/dL — ABNORMAL LOW (ref 12.0–15.0)
MCH: 26.8 pg (ref 26.0–34.0)
MCH: 27.4 pg (ref 26.0–34.0)
MCHC: 30 g/dL (ref 30.0–36.0)
MCHC: 30.1 g/dL (ref 30.0–36.0)
MCV: 89.3 fL (ref 78.0–100.0)
MCV: 91.1 fL (ref 78.0–100.0)
PLATELETS: 330 10*3/uL (ref 150–400)
PLATELETS: 263 10*3/uL (ref 150–400)
RBC: 3.28 MIL/uL — ABNORMAL LOW (ref 3.87–5.11)
RBC: 2.81 MIL/uL — AB (ref 3.87–5.11)
RDW: 16.6 % — AB (ref 11.5–15.5)
RDW: 16.8 % — ABNORMAL HIGH (ref 11.5–15.5)
WBC: 18.9 10*3/uL — ABNORMAL HIGH (ref 4.0–10.5)
WBC: 13.9 10*3/uL — AB (ref 4.0–10.5)

## 2016-12-05 LAB — POCT I-STAT, CHEM 8
BUN: 22 mg/dL — AB (ref 6–20)
CREATININE: 1.2 mg/dL — AB (ref 0.44–1.00)
Calcium, Ion: 1.17 mmol/L (ref 1.15–1.40)
Chloride: 101 mmol/L (ref 101–111)
Glucose, Bld: 122 mg/dL — ABNORMAL HIGH (ref 65–99)
HEMATOCRIT: 24 % — AB (ref 36.0–46.0)
HEMOGLOBIN: 8.2 g/dL — AB (ref 12.0–15.0)
POTASSIUM: 4.7 mmol/L (ref 3.5–5.1)
SODIUM: 138 mmol/L (ref 135–145)
TCO2: 25 mmol/L (ref 0–100)

## 2016-12-05 LAB — MAGNESIUM
MAGNESIUM: 3.1 mg/dL — AB (ref 1.7–2.4)
Magnesium: 2.8 mg/dL — ABNORMAL HIGH (ref 1.7–2.4)

## 2016-12-05 MED ORDER — INSULIN DETEMIR 100 UNIT/ML ~~LOC~~ SOLN
10.0000 [IU] | Freq: Two times a day (BID) | SUBCUTANEOUS | Status: DC
  Filled 2016-12-05 (×3): qty 0.1

## 2016-12-05 MED ORDER — METOCLOPRAMIDE HCL 5 MG/ML IJ SOLN
10.0000 mg | Freq: Four times a day (QID) | INTRAMUSCULAR | Status: DC | PRN
  Administered 2016-12-05 – 2016-12-07 (×6): 10 mg via INTRAVENOUS
  Filled 2016-12-05 (×6): qty 2

## 2016-12-05 MED ORDER — INSULIN ASPART 100 UNIT/ML ~~LOC~~ SOLN
0.0000 [IU] | SUBCUTANEOUS | Status: DC
  Administered 2016-12-06: 2 [IU] via SUBCUTANEOUS
  Administered 2016-12-06: 4 [IU] via SUBCUTANEOUS
  Administered 2016-12-06 (×2): 2 [IU] via SUBCUTANEOUS

## 2016-12-05 MED ORDER — POTASSIUM CHLORIDE 10 MEQ/50ML IV SOLN
10.0000 meq | INTRAVENOUS | Status: AC
  Administered 2016-12-05 (×2): 10 meq via INTRAVENOUS
  Filled 2016-12-05: qty 50

## 2016-12-05 MED ORDER — FUROSEMIDE 10 MG/ML IJ SOLN
20.0000 mg | Freq: Two times a day (BID) | INTRAMUSCULAR | Status: DC
  Administered 2016-12-05 – 2016-12-09 (×9): 20 mg via INTRAVENOUS
  Filled 2016-12-05 (×9): qty 2

## 2016-12-05 MED ORDER — ENOXAPARIN SODIUM 40 MG/0.4ML ~~LOC~~ SOLN
40.0000 mg | Freq: Every day | SUBCUTANEOUS | Status: DC
  Administered 2016-12-05 – 2016-12-11 (×7): 40 mg via SUBCUTANEOUS
  Filled 2016-12-05 (×7): qty 0.4

## 2016-12-05 MED ORDER — INSULIN DETEMIR 100 UNIT/ML ~~LOC~~ SOLN: 10.0000 [IU] | Freq: Two times a day (BID) | SUBCUTANEOUS | Status: DC

## 2016-12-05 NOTE — Hospital Discharge Follow-Up (Signed)
Transitional Care Clinic Care Coordination Note:  Admit date:  11/27/2016 Discharge date: TBD Discharge Disposition: home Patient contact: # [phone] Emergency contact(s): no other # provided.   This Case Manager reviewed patient's EMR and determined patient would benefit from post-discharge medical management and chronic care management services through the [facility]. Patient has a history of CAD, PAF, DM2, chronic diastolic CFH, VT,s/p ICD placement,  orthostatic hypotension,moderate pulmonary hypertension. She was admitted following a syncopal episode secondary to VFib reuiring ICD firing. She has been treated for a NSTEMI and is s/p CABG x 4 on 12/04/16.  She has 1 hospitalization and 1 ED visit in the past year.  This Case Manager met with patient to discuss the services and medical management that can be provided at the [facility]. Patient verbalized understanding and agreed to receive post-discharge care at the [facility].   Patient scheduled for Transitional Care appointment on 12/15/16 @ 0945. Clinic information and appointment time provided to patient. Appointment information also placed on AVS.  Assessment:       Home Environment: lives in 2 story home with her daughter and her ( patient's) grandson.  She said that her bedroom is on the first floor.        Support System: daughter and grandson. She also noted that she has 2 sisters who live about an hour away. She said that she also had another sister who died shortly after having a cardiac cath.        Level of functioning:independent. The patient is an Therapist, sports and said that she hopes now that she had the surgery, she would be able to work part time.        Home DME: walker, cane       Home care services: (services arranged prior to discharge or new services after discharge) TBD, none in the past.       Transportation: She said that her daughter can drive but currently they do not have a car.      Food/Nutrition: (ability to afford, access, use of any community resources)  She noted that she has adequate nutrition. She said that she has the information about local food pantries and stated that [employer] pant[facility] has been very helpful. This CM provided information about free meals in [place]. She said that she does not have food stamps but her daughter had them but they were stopped when her grandson started working.         Medications: (ability to afford, access, compliance, Pharmacy used) : She currently uses [facility]. She said that she has occasional co-pays that she is not able to afford  --ie xarelto has a $45 co-pay. She noted that now that she has insurance, she is not eligible for the prescription assistance program.         Identified Barriers: co-pays for some medications, transportation. Lack of income in her home ( she receives social security but her daughter does not work and her grandson is only 81 years old.)         PCP (Name, office location, phone number): Dr Jarold Song - [place] services: Voice mail message left for Whitman Hero, RN CM with the appointment date/time.

## 2016-12-05 NOTE — Progress Notes (Signed)
BP now 110's/50's; will leave gtts at current rate; pt remains in chair, resting with legs elevated; will cont. To monitor.  [name]

## 2016-12-05 NOTE — Progress Notes (Addendum)
Pt assisted OOB to chair with RN X3; pt very dizzy, nauseated, and pale upon standing and transitioning to chair; pt assisted to sitting down in chair, pt encouraged to keep eyes open and take slow, deep breaths; pt slow to respond; pacer set AAI 90; sBP down to 70's; drips adjusted accordingly; pt legs elevated and cool rag placed on forehead; will cont. To monitor.  Ruben Reason

## 2016-12-05 NOTE — Discharge Summary (Signed)
Physician Discharge Summary       [address]       [place] [address]             [phone]    Patient ID: [name] MRN: [id number] DOB/AGE: 69-Jul-1947 69 y.o.  Admit date: 11/27/2016 Discharge date: 01/08/2017  Admission Diagnoses: 1. Ventricular fibrillation ([place]) 2. NSTEMI (non-ST elevated myocardial infarction) ([place]) 3. CAD (coronary artery disease)  Active Diagnoses:  1. Acute on chronic diastolic (congestive) heart failure ([place]) 2. Atrial fibrillation with rapid ventricular response ([place]) 3. Diabetes mellitus ([place]) 4. Hypertrophic cardiomyopathy ([place]) 5. Tricuspid regurgitation 6. Ventricular tachycardia ([place]) 7. Tobacco abuse 8. ABL anemia 9. Urinary retention 10. UTI 11. Cystitis 12. Cardiogenic shock 13. Left pleural effusion  Consultants: Dental (Dr. Enrique Sack) and heart failure   Procedure (s):   1. Left Heart Cath and Coronary Angiography by Dr. Tamala Julian on 11/28/2016:  Conclusion    Severe three-vessel native coronary disease with chronic total occlusion of the RCA supplied by left-to-right collaterals. Inferior wall branches are graftable.  Total occlusion of the mid circumflex with retrograde filling of 2 large obtuse marginal branches. Branches appear graftable.  Severe LAD and diagonal disease with subtotal occlusion in the mid vessel of the LAD. 90% stenosis in the proximal portion of large diagonal.  Elevated left ventricular end-diastolic pressure during tachycardia.  Sustained, hemodynamically stable ventricular tachycardia during the procedure that required pace termination via the AICD.  RECOMMENDATIONS:   Assessment of LV function by CT and/or echo with specific attention to the presence absence of an LV aneurysm involving the inferior wall.  TCTS notified of need for CABG and ? Aneurysm resection if present.   2.CORONARY ARTERY BYPASS GRAFTING (CABG)x 4 INTRAOPERATIVE TRANSESOPHAGEAL ECHOCARDIOGRAMWITH  ENDOSCOPIC HARVESTING OF RIGHT SAPHENOUS VEIN, CLIPPING OF ATRIAL APPENDAGE USING ATRICURE 86 ATRICLIP (Left),  MAZE PROCEDURE, Pulmonary vein isolation with RF by Dr. Roxan Hockey on 12/04/2016.  Left thoracentesis by IR on 12/22/2016  History of Presenting Illness: This is a 71 yo woman with a past history of CAD, multiple MI, hypertrophic cardiomyopathy, moderate MR, moderate to severe TR, VT, ICD, paroxysmal atrial fibrillation and chronic diastolic heart failure. She also has a history of diabetes and orthostatic hypotension. I saw her in 2016 after she presented in heart failure. I recommended initial medical therapy with interval CABG in 2-3 months but she decided she did not want to have surgery.  She was in her usual state of health until Tuesday of this week when she had several dizzy spells. On Wednesday night she had a syncopal spell while using the bathroom and fell hitting her head. She came tot he ED. Interrogation of her ICD showed 6 episodes of VF. Her ECG showed inferior ST depression. She ruled in for MI with a peak troponin of 1.92. Her BNP was 920.  She had cardiac cath yesterday which revealed severe 3 vessel CAD. Unable to do LV gram due to VT when pigtail advanced into LV.  She denies any CP prior to admission but was feeling tired and noticed increased abdominal fullness. Some peripheral edema- she manages with lasix.   She would be a relatively high risk patient for surgery, but Dr. Roxan Hockey did not think it is prohibitive. Dr. Roxan Hockey discussed the general nature of the procedure, the need for general anesthesia, the use of cardiopulmonary bypass, and the incisions to be used with Ms. Bottari and her family. Dr. Roxan Hockey discussed the expected hospital stay, overall recovery and  short and long term outcomes. Dr. Roxan Hockey informed them of the indications, risks, benefits and alternatives (medical therapy). They agreed to proceed with surgery. Pre operative  carotid duplex US showed no significant interna carotid artery stenosis bilaterally. She underwent a CABG x 4, MAZE, LA clip, and pulmonary vein isolation with RF on 12/04/2016.   Brief Hospital Course:  The patient was extubated late the evening of surgery without difficulty. She remained afebrile and hemodynamically stable. She was weaned off of Dopamine and Neo Syne;phrine drisp. She was initially AAI paced. Gordy Councilman, a line, chest tubes, and foley were removed early in the post operative course. She was not initially put on a beta blocker secondary to labile BP.  She was put on Midodrine for symptomatic labile BP. Her BP gradually improved and this was stopped on 12/10/2016. She was volume over loaded and diuresed.  She did have persistent post op nausea, which took several days to improve. She developed urinary retention and had to have the foley put back in on 05/05.  She later was able to void on her own. She was then put on Lopressor which was titrated accordingly. She went into a fib and was put on Cardizem and patient did want to be put on Amiodarone;however, because she did not convert and rate was not well controlled, she  was later put on Amiodarone. She did convert to sinus rhythm.  She had ABL anemia. She did later require a post op transfusion. She was weaned off the insulin drip.  Once she was tolerating a diet, Glipizide was restarted.  The patient's glucose remained well controlled. Her pre op HGA1C was 7. The patient was felt surgically stable for transfer from the ICU to PCTU for further convalescence on 12/10/2016.   Epicardial pacing wires were removed on 12/11/2016. She continues to progress with cardiac rehab. She was ambulating on room air. She was found to have a UTI on 05/13 and was put on Cipro. UC showed E.Coli. Cipro was stopped and she was put on Septra. She also had hematuria. Xarelto was held. She was found to have cystitis and was put on Pyridium. Antibiotic was changed to  Ceftriaxone based on urine culture sensitivities. Spironolactone was started. Her BUN and Cr started to rise on 05/16.  Also, her BP was becoming more labile. Heart failure was consulted. The concern was she had low cardiac output. Lopressor was stopped and co ox was checked.  Co ox was 41%. She was started on a Milrinone drip. Her co ox worsened the afternoon of 05/16 to 34%. She was in a fib with RVR. She was started on an Amiodarone drip. Patient was transferred back to the ICU and also started on Nor epinephrine and Lasix drips. It was felt she was in cardiogenic shock. Co ox's were monitored daily. Milrinone and Nor epinephrine drips were titrated by heart failure. Nor epinephrine was stopped on 05/17. CT of abdomen and pelvis showed moderate bilateral pleural, no acute process within abdomen, and no bladder stone or tumor. Echo showed LVEF 40-45%, no significant valvular disease, no pericardial effusion. She converted back to sinus rhythm so IV Amiodarone was stopped. She was put on oral Amiodarone. She was then found to have a moderate to large left pleural effusion. She underwent a left thoracentesis on 05/21 (1.2 liters of fluid was removed). She then got a yeast infection from the antibiotic and was treated with Clotrimazole. Lasix drip was stopped and foley removed on 05/22. She was able  to void in small amounts but still had incomplete evacuation. Bladder scans were done and output was monitored closely. She was not symptomatic and trying to avoid in and out cath with recent hematuria. Rocephin was stopped on 05/23.  She was felt stable for transfer from the ICU again to 2000 on 12/25/2016. Because of increased LE edema, she was restarted on Lasix. She was also restarted on Milrinone drip and her co ox was monitored. Heart failure team continued to follow her closely. H and H on 05/29 is 8.8 and 29.3. Last creatinine was 1.3. Chest tube sutures have already been removed. The patient is felt surgically  stable for discharge today.   Latest Vital Signs: Blood pressure (!) 100/59, pulse 70, temperature 97.5 F (36.4 C), temperature source Oral, resp. rate 18, height 5\' 4"  (1.626 m), weight 180 lb 11.2 oz (82 kg), SpO2 (!) 87 %.  Physical Exam: General appearance: alert, cooperative and no distress Heart: regular rate and rhythm and soft systolic murmur Lungs: clear to auscultation bilaterally Abdomen: benign Extremities: edema is improved Wound: incis healing well   Discharge Condition: Stable and discharged to home on home milrinone.  Recent laboratory studies:  Lab Results  Component Value Date   WBC 7.5 01/07/2017   HGB 9.1 (L) 01/07/2017   HCT 30.4 (L) 01/07/2017   MCV 87.4 01/07/2017   PLT 300 01/07/2017   Lab Results  Component Value Date   NA 135 01/08/2017   K 3.9 01/08/2017   CL 98 (L) 01/08/2017   CO2 26 01/08/2017   CREATININE 1.31 (H) 01/08/2017   GLUCOSE 135 (H) 01/08/2017   Diagnostic Studies: Dg Orthopantogram  Result Date: 11/29/2016 CLINICAL DATA:  Dental carries; CHF; pt denies any dental pain a EXAM: ORTHOPANTOGRAM/PANORAMIC COMPARISON:  CT, 11/27/2016 FINDINGS: No fracture.  No bone lesion. There multiple missing teeth. There are significant curies resorbing the crown of the single remaining left maxillary molar, #15. There is resorption of the crown also consistent with a carie of the left mandibular lateral incisor, #23, and the right mandibular Cuspid, #27. No significant periapical lucency. IMPRESSION: 1. No fracture or bone lesion. 2. Multiple missing teeth. Dental caries involving 3 of the remaining teeth as detailed above. Electronically Signed   By: Lajean Manes M.D.   On: 11/29/2016 17:12   Ct Head Wo Contrast  Result Date: 11/27/2016 CLINICAL DATA:  71 year old female with possible head trauma. EXAM: CT HEAD WITHOUT CONTRAST CT CERVICAL SPINE WITHOUT CONTRAST TECHNIQUE: Multidetector CT imaging of the head and cervical spine was performed  following the standard protocol without intravenous contrast. Multiplanar CT image reconstructions of the cervical spine were also generated. COMPARISON:  Head CT dated 05/11/2015 FINDINGS: CT HEAD FINDINGS Brain: The ventricles and sulci appropriate size for patient's age. Mild periventricular and deep white matter chronic microvascular ischemic changes noted. There is no acute intracranial hemorrhage. No mass effect or midline shift noted. No extra-axial fluid collections. Vascular: No hyperdense vessel or unexpected calcification. Skull: Normal. Negative for fracture or focal lesion. Sinuses/Orbits: There is a 2.1 x 1.8 cm calcified mass in the right maxillary sinus which appears to arise from the right maxillary bone. This lesion is incompletely visualized but appears new compared to the CT of 05/11/2015. This may represent an osteoma, ossifying fibroma or an odontogenic lesion. If the patient has symptoms of sinusitis or right-sided dental pain a nonemergent facial CT could be considered. No definite aggressive features noted, however disease lesion is partially visualized. The visualized paranasal  sinuses and mastoid air cells are clear. No air-fluid levels. Other: Small left parietal scalp contusion. CT CERVICAL SPINE FINDINGS Alignment: No acute subluxation. Skull base and vertebrae: No acute fracture. No primary bone lesion or focal pathologic process. Soft tissues and spinal canal: No prevertebral fluid or swelling. No visible canal hematoma. Disc levels: There is degenerative changes with endplate irregularity and disc space narrowing and C6-C7. There is associated osteophyte at this level. Multilevel facet hypertrophy noted. Upper chest: Stop partially visualized bilateral pleural effusions. Left pectoral pacemaker device partially visualized. Probable calcified thyroid nodules. Ultrasound may provide better evaluation of the thyroid gland. Other: None IMPRESSION: 1. No acute intracranial hemorrhage.  Mild chronic microvascular ischemic changes. 2. No acute/traumatic cervical spine pathology. 3. Rounded calcified mass in the right maxillary sinus, new compared to the prior CT. This may represent an osteoma, an ossifying fibroma or an odontogenic lesion. Correlation with clinical exam recommended. CT of the maxillofacial bones on a non emergent basis may provide better evaluation. 4. Partially visualized bilateral pleural effusions. Electronically Signed   By: Anner Crete M.D.   On: 11/27/2016 03:00   Ct Cervical Spine Wo Contrast  Result Date: 11/27/2016 CLINICAL DATA:  71 year old female with possible head trauma. EXAM: CT HEAD WITHOUT CONTRAST CT CERVICAL SPINE WITHOUT CONTRAST TECHNIQUE: Multidetector CT imaging of the head and cervical spine was performed following the standard protocol without intravenous contrast. Multiplanar CT image reconstructions of the cervical spine were also generated. COMPARISON:  Head CT dated 05/11/2015 FINDINGS: CT HEAD FINDINGS Brain: The ventricles and sulci appropriate size for patient's age. Mild periventricular and deep white matter chronic microvascular ischemic changes noted. There is no acute intracranial hemorrhage. No mass effect or midline shift noted. No extra-axial fluid collections. Vascular: No hyperdense vessel or unexpected calcification. Skull: Normal. Negative for fracture or focal lesion. Sinuses/Orbits: There is a 2.1 x 1.8 cm calcified mass in the right maxillary sinus which appears to arise from the right maxillary bone. This lesion is incompletely visualized but appears new compared to the CT of 05/11/2015. This may represent an osteoma, ossifying fibroma or an odontogenic lesion. If the patient has symptoms of sinusitis or right-sided dental pain a nonemergent facial CT could be considered. No definite aggressive features noted, however disease lesion is partially visualized. The visualized paranasal sinuses and mastoid air cells are clear. No  air-fluid levels. Other: Small left parietal scalp contusion. CT CERVICAL SPINE FINDINGS Alignment: No acute subluxation. Skull base and vertebrae: No acute fracture. No primary bone lesion or focal pathologic process. Soft tissues and spinal canal: No prevertebral fluid or swelling. No visible canal hematoma. Disc levels: There is degenerative changes with endplate irregularity and disc space narrowing and C6-C7. There is associated osteophyte at this level. Multilevel facet hypertrophy noted. Upper chest: Stop partially visualized bilateral pleural effusions. Left pectoral pacemaker device partially visualized. Probable calcified thyroid nodules. Ultrasound may provide better evaluation of the thyroid gland. Other: None IMPRESSION: 1. No acute intracranial hemorrhage. Mild chronic microvascular ischemic changes. 2. No acute/traumatic cervical spine pathology. 3. Rounded calcified mass in the right maxillary sinus, new compared to the prior CT. This may represent an osteoma, an ossifying fibroma or an odontogenic lesion. Correlation with clinical exam recommended. CT of the maxillofacial bones on a non emergent basis may provide better evaluation. 4. Partially visualized bilateral pleural effusions. Electronically Signed   By: Anner Crete M.D.   On: 11/27/2016 03:00   Study Result   CLINICAL DATA:  Shortness of breath.  EXAM: PORTABLE CHEST 1 VIEW  COMPARISON:  12/22/2016 .  FINDINGS: Right PICC line in stable position. Cardiac pacer with lead tips in right atrium and right ventricle. Prior CABG. Left atrial appendage clip again noted. Stable cardiomegaly. Persistent basilar atelectasis. Interim improvement of left pleural effusion. No pneumothorax  IMPRESSION: 1.  A pacer stable position.  Prior CABG.  Stable cardiomegaly.  2. Persistent basilar atelectasis. Interim improvement of left-sided pleural effusion.   Electronically Signed   By: Marcello Moores  Register   On: 12/24/2016  08:16    Nm Myocar Single W/spect W/wall Motion And Ef  Result Date: 01/06/2017 CLINICAL DATA:  Evaluate for cardiac amyloid. EXAM: NM MYOCARDIAL SINGLE W/SPECT TECHNIQUE: Following the IV administration of the radiopharmaceutical multi planar SPECT imaging of the chest was performed. RADIOPHARMACEUTICALS:  16.1 millicuries technetium 28m pyrophosphate, IV. COMPARISON:  None. FINDINGS: There is no significant radiotracer uptake within the left ventricular myocardium. Normal physiologic distribution of the radiopharmaceutical noted including within postsurgical changes involving the sternum and manubrium. IMPRESSION: 1. Examination is negative for significant radiotracer uptake within the myocardium to suggest cardiac amyloid. Electronically Signed   By: Kerby Moors M.D.   On: 01/06/2017 14:20   Dg Chest Port 1 View  Result Date: 01/07/2017 CLINICAL DATA:  Acute respiratory distress EXAM: PORTABLE CHEST 1 VIEW COMPARISON:  01/06/2017, 12/24/2016 FINDINGS: Moderate left pleural effusion. Cardiomegaly with central vascular congestion and mild interstitial edema. Right mid lung and left basilar opacity may reflect atelectasis or infiltrate. Aortic atherosclerosis. No pneumothorax. Post sternotomy changes. Left-sided pacing device as before. Right upper extremity catheter tip overlies the distal SVC. IMPRESSION: 1. Cardiomegaly with moderate left pleural effusion and stable left lower lobe atelectasis or pneumonia. There is vascular congestion and mild pulmonary edema, slightly worsened in the interim. Electronically Signed   By: Donavan Foil M.D.   On: 01/07/2017 21:38   Dg Chest Port 1 View  Result Date: 01/06/2017 CLINICAL DATA:  CABG. EXAM: PORTABLE CHEST 1 VIEW COMPARISON:  12/24/2016 . FINDINGS: Right PICC line in stable position. Cardiac pacer stable position. Prior CABG. Left atrial appendage clip in stable position. Cardiomegaly with diffuse bilateral increased interstitial prominence and  left-sided pleural effusion consistent with CHF. Left lower lobe atelectasis . IMPRESSION: 1. Right PICC line stable position. 2. Cardiac pacer stable position. Prior CABG. Cardiomegaly with diffuse bilateral pulmonary interstitial prominence and left-sided pleural effusion consistent with CHF. These findings are new from prior exam. 3. Left lower lobe atelectasis and dense. Electronically Signed   By: Marcello Moores  Register   On: 01/06/2017 07:45     Discharge Instructions    AMB Referral to [place] Management    Complete by:  As directed    Please assign to [facility] for transition of care and for CHF disease and symptom management. Has multiple comorbidites. Has had extended length of hospital stay. Currently at [facility]. Written consent obtained. Please call with questions. Thanks. [name], [place], [facility] [phone]   Reason for consult:  Please assign to [facility] RNCM   Diagnoses of:   Diabetes Heart Failure     Expected date of contact:  1-3 days (reserved for hospital discharges)   Amb Referral to Cardiac Rehabilitation    Complete by:  As directed    Diagnosis:   CABG NSTEMI     CABG X ___:  5     Discharge Medications: Allergies as of 01/08/2017      Reactions   No Known Allergies  Medication List    STOP taking these medications   acetaminophen 325 MG tablet Commonly known as:  TYLENOL   rivaroxaban 20 MG Tabs tablet Commonly known as:  XARELTO     TAKE these medications   amiodarone 200 MG tablet Commonly known as:  PACERONE Take 1 tablet (200 mg total) by mouth 2 (two) times daily.   apixaban 5 MG Tabs tablet Commonly known as:  ELIQUIS Take 1 tablet (5 mg total) by mouth 2 (two) times daily.   atorvastatin 40 MG tablet Commonly known as:  LIPITOR Take 1 tablet (40 mg total) by mouth daily at 6 PM.   folic acid 1 MG tablet Commonly known as:  FOLVITE Take 1 tablet (1 mg total) by mouth  daily. Start taking on:  01/09/2017   furosemide 40 MG tablet Commonly known as:  LASIX 80 mg po q am and 40 mg po  q pm What changed:  how much to take  how to take this  when to take this  additional instructions   glipiZIDE 5 MG tablet Commonly known as:  GLUCOTROL Take 1 tablet (5 mg total) by mouth daily.   glucose blood test strip Commonly known as:  TRUE METRIX BLOOD GLUCOSE TEST Use as instructed twice daily   methylPREDNISolone 4 MG Tbpk tablet Commonly known as:  MEDROL DOSEPAK As directed for six day taper   milrinone 20 MG/100 ML Soln infusion Commonly known as:  PRIMACOR Inject 30 mcg/min into the vein continuous.   oxyCODONE 5 MG immediate release tablet Commonly known as:  Oxy IR/ROXICODONE Take 1-2 tablets (5-10 mg total) by mouth every 6 (six) hours as needed for severe pain.   TRUE METRIX METER Devi 1 each by Does not apply route 2 (two) times daily.   TRUEPLUS LANCETS 28G Misc Use as instructed twice daily.            Durable Medical Equipment        Start     Ordered   01/07/17 [id number]  For home use only DME 3 n 1  Once     01/07/17 0935   01/07/17 0807  Heart failure home health orders  (Heart failure home health orders / Face to face)  Once    Comments:  Heart Failure Follow-up Care:  Verify follow-up appointments per Patient Discharge Instructions. Confirm transportation arranged. Reconcile home medications with discharge medication list. Remove discontinued medications from use. Assist patient/caregiver to manage medications using pill box. Reinforce low sodium food selection Assessments: Vital signs and oxygen saturation at each visit. Assess home environment for safety concerns, caregiver support and availability of low-sodium foods. Consult Education officer, museum, PT/OT, Dietitian, and CNA based on assessments. Perform comprehensive cardiopulmonary assessment. Notify MD for any change in condition or weight gain of 3 pounds in one day or 5  pounds in one week with symptoms. Daily Weights and Symptom Monitoring:  Ensure patient has access to scales. Teach patient/caregiver to weigh daily before breakfast and after voiding using same scale and record.    Teach patient/caregiver to track weight and symptoms and when to notify Provider. Activity: Develop individualized activity plan with patient/caregiver.  Milrinone 0.375 mcg/kg infusion x 12 months.  Question Answer Comment  Heart Failure Follow-up Care [facility] ([facility] at [place] Visits Set up telemonitoring equipment to monitor daily vital signs, weights and oxygen saturation   Obtain the following labs Basic Metabolic Panel   Lab frequency Weekly  Fax lab results to AHF Clinic at [phone]   Diet Low Sodium Heart Healthy   Fluid restrictions: 2000 mL Fluid   Skilled Nurse to notify MD of weight trends weekly for first 2 weeks. May fax or call: AHF Clinic at [phone] (fax) or [facility] Diuretic Protocol to be used by [place] only ( to be ordered by Heart Failure Team Providers Only) Yes   Initiate Heart Failure Clinic Diuretic Protocol to be used by [place] only ( to be ordered by Heart Failure Team Providers Only) No      01/07/17 0808     The patient has been discharged on:   1.Beta Blocker:  Yes [  ]                              No   [   ]                              If No, reason:unable to tolerate with CHF  2.Ace Inhibitor/ARB: Yes [   ]                                     No  [  n  ]                                     If No, reason:renal insuff   3.Statin:   Yes [  x ]                  No  [   ]                  If No, reason:  4.Ecasa:  Yes  [ x  ]                  No   [   ]                  If No, reason:  Follow Up Appointments:  Contact information for follow-up providers    Melrose Nakayama, MD Follow up on 02/03/2017.   Specialty:   Cardiothoracic Surgery Why:  PA/LAT CXR to be taken (at [facility] which is in the same building as Dr. Leonarda Salon office) on 02/03/2017 at 12:30 pm;Appointment time is at 1:00 pm Contact information: [address] [place] [address] [place]. Go on 01/12/2017.   Why:  at 9:45am for an appointment with Dr Jarold Song in the [facility]. Please bring all of your medications with you to the appointment.  Contact information: [place] [id number] [place], [facility] Follow up.   Why:  Home IV milrinone-  HHRN- HF  Contact information: [address] [place] [address] [phone]        Shirley Friar, PA-C Follow up on 01/16/2017.   Specialty:  Physician Assistant Why:  at 10:00 am for follow up. Garage code [phone].  Contact information: [place] [address]  [place] Follow up.   Why:  3n1 arranged- to be delivered to room prior to discharge Contact information: [address] [place] [address] [phone]            Contact information for after-discharge care    Destination    [facility] .   Specialty:  [place] information: [address] [place] [phone]                  SignedJadene Pierini EPA-C 01/08/2017, 1:23 PM

## 2016-12-05 NOTE — Progress Notes (Signed)
1 Day Post-Op Procedure(s) (LRB): CORONARY ARTERY BYPASS GRAFTING (CABG)x 4 WITH ENDOSCOPIC HARVESTING OF RIGHT SAPHENOUS VEIN (N/A) CLIPPING OF ATRIAL APPENDAGE USING ATRICURE 45 ATRICLIP (Left) INTRAOPERATIVE TRANSESOPHAGEAL ECHOCARDIOGRAM (N/A) MAZE PROCEDURE (N/A) Subjective: Some incisional pain, + nausea  Objective: Vital signs in last 24 hours: Temp:  [97.9 F (36.6 C)-98.6 F (37 C)] 97.9 F (36.6 C) (05/04 0645) Pulse Rate:  [65-106] 90 (05/04 0645) Cardiac Rhythm: Atrial paced (05/04 0400) Resp:  [11-37] 16 (05/04 0645) BP: (91-136)/(45-86) 95/57 (05/04 0600) SpO2:  [88 %-100 %] 99 % (05/04 0645) Arterial Line BP: (81-125)/(46-61) 108/51 (05/04 0645) FiO2 (%):  [40 %-50 %] 40 % (05/03 2145) Weight:  [178 lb 9.2 oz (81 kg)] 178 lb 9.2 oz (81 kg) (05/04 0500)  Hemodynamic parameters for last 24 hours: PAP: (33-45)/(16-28) 36/19 CO:  [2.2 L/min-4.1 L/min] 3.8 L/min CI:  [1.2 L/min/m2-2.3 L/min/m2] 2.1 L/min/m2  Intake/Output from previous day: 05/03 0701 - 05/04 0700 In: 4160.5 [I.V.:3010.5; IV Piggyback:1150] Out: 4174 [Urine:2940; Blood:800; Chest Tube:490] Intake/Output this shift: No intake/output data recorded.  General appearance: alert, cooperative and no distress Neurologic: intact Heart: regular rate and rhythm Lungs: diminished breath sounds bibasilar Abdomen: normal findings: soft, non-tender  Lab Results:  Recent Labs  12/04/16 1707 12/05/16 0452  WBC 17.9* 18.9*  HGB 10.2*  10.0* 8.8*  HCT 30.0*  31.8* 29.3*  PLT 281 330   BMET:  Recent Labs  12/04/16 0245  12/04/16 1601 12/04/16 1707 12/05/16 0452  NA 134*  < > 137 138 138  K 3.7  < > 4.7 4.2 4.2  CL 99*  < > 97*  --  102  CO2 27  --   --   --  26  GLUCOSE 156*  < > 159* 122* 110*  BUN 21*  < > 19  --  20  CREATININE 1.17*  < > 1.10*  --  1.17*  CALCIUM 8.9  --   --   --  8.3*  < > = values in this interval not displayed.  PT/INR:  Recent Labs  12/04/16 1707  LABPROT 16.7*   INR 1.34   ABG    Component Value Date/Time   PHART 7.369 12/04/2016 2343   HCO3 24.0 12/04/2016 2343   TCO2 25 12/04/2016 2343   ACIDBASEDEF 1.0 12/04/2016 2343   O2SAT 94.0 12/04/2016 2343   CBG (last 3)   Recent Labs  12/05/16 0512 12/05/16 0557 12/05/16 0659  GLUCAP 105* 95 93    Assessment/Plan: S/P Procedure(s) (LRB): CORONARY ARTERY BYPASS GRAFTING (CABG)x 4 WITH ENDOSCOPIC HARVESTING OF RIGHT SAPHENOUS VEIN (N/A) CLIPPING OF ATRIAL APPENDAGE USING ATRICURE 45 ATRICLIP (Left) INTRAOPERATIVE TRANSESOPHAGEAL ECHOCARDIOGRAM (N/A) MAZE PROCEDURE (N/A) -CV- stable hemodynamics. In SR in 60s, continue atrial pacing for now. ICD is on  Good index- dc swan  Dc A line  Restart Xarelto prior to dc  RESP- IS for atelectasis  RENAL- creatinine and lytes OK, gentle diuresis  ENDO_ CBG well controlled with drip  Change to CBG + SSI with low dose levemir  Given nausea will wait a day before restarting PO meds  DC Chest tubes  SCD + enoxaparin for DVT prophylaxis  Mobilize  LOS: 8 days    Melrose Nakayama 12/05/2016

## 2016-12-05 NOTE — Op Note (Signed)
[name], [name] NO.:  [id number]  MEDICAL RECORD NO.:  [id number]  LOCATION:  2H26C                        FACILITY:  [facility]  PHYSICIAN:  Revonda Standard. Roxan Hockey, M.D.DATE OF BIRTH:  07/12/1946  DATE OF PROCEDURE:  12/04/2016 DATE OF DISCHARGE:                              OPERATIVE REPORT   PREOPERATIVE DIAGNOSIS:  Severe 3-vessel coronary artery disease with ischemic cardiomyopathy and paroxysmal atrial fibrillation.  POSTOPERATIVE DIAGNOSIS:  Severe 3-vessel coronary artery disease with ischemic cardiomyopathy and paroxysmal atrial fibrillation.  PROCEDURE:  Median sternotomy; extracorporeal circulation; Coronary artery bypass grafting x 5  Left internal mammary artery to LAD,  Saphenous vein graft to diagonal,  Sequential saphenous vein graft to obtuse marginals 3 and 4,  Saphenous vein graft to posterior descending; Endoscopic vein harvest, right leg; Radiofrequency isolation of pulmonary veins using AtriCure bipolar device; Placement of 45 mm left atrial clip.  CLINICAL NOTE:  [name] is a 71 year old woman with longstanding history of coronary artery disease and a history of ventricular tachycardia and ventricular fibrillation arrest.  She was admitted after feeling ill and having a syncopal episode and turned out to have had multiple VFib arrests, from which she was rescued with her implanted ICD.  She had previously been offered CABG, but decided she did not want surgery. She was advised to have coronary bypass grafting and this time accepted the risks and agreed to proceed.  We did discuss the possibility of a maze procedure due to her history of paroxysmal atrial fibrillation.  We also discussed placing a left atrial clip.  We plan to evaluate the mitral valve and tricuspid valve intraoperatively with transesophageal echocardiography to determine if any intervention was needed on those areas.  The indications, risks, benefits, and  alternatives were discussed in detail with the patient.  She understood and accepted the risks and agreed to proceed.  OPERATIVE NOTE:  [name] was brought to the preoperative holding area on Dec 04, 2016.  Anesthesia placed a Swan-Ganz catheter and arterial blood pressure monitoring line.  Of note, her pulmonary artery pressures were elevated, but not as much as indicated by the preoperative echo.  Her pulmonary arterial systolic pressures were in the mid 50s.  She was taken to the operating room, anesthetized, and intubated.  Intravenous antibiotics were administered.  A Foley catheter was placed.  Transesophageal echocardiography was performed by Dr. Tedra Senegal of the anesthesia service.  It revealed only mild MR, moderate TR, ejection fraction was approximately 40-45%.  There was no aortic valve pathology.  The chest, abdomen, and legs were prepped and draped in usual sterile fashion.  A median sternotomy was performed.  There was sternal osteoporosis. Retractor was placed and the left internal mammary artery was harvested using standard technique under direct vision.  Simultaneously, an incision was made in the medial aspect of the right leg at the level of the knee. The greater saphenous vein was harvested from mid calf to groin endoscopically.  The mammary artery and vein were both good quality vessels. 2000 units of heparin  was administered during the vessel harvest. The remainder of the full heparin dose was given after harvesting the conduits.  A sternal retractor  was placed.  The pericardium was opened.  The ascending aorta was inspected.  There was no significant atherosclerotic disease.  After confirming adequate anticoagulation with ACT measurement, the aorta was cannulated via concentric 2-0 Ethibond pledgeted pursestring sutures.  A dual stage venous cannula was placed via pursestring suture in right atrial appendage.  Cardiopulmonary bypass was initiated.   Flows were maintained per protocol.  The patient was cooled to 32 degrees Celsius.  The coronary arteries were inspected and anastomotic sites were chosen.  The conduits were inspected and cut to length.  A foam pad was placed in the pericardium to insulate the heart.  A temperature probe was placed in the myocardial septum and a cardioplegia cannula was placed in the ascending aorta.  Pulmonary vein isolation was performed with the AtriCure bipolar radiofrequency ablation device prior to arresting the heart.  The device was placed on the left atrium just proximal to the confluence of the veins doing the left-sided veins first followed by the right-sided veins.  Care was taken to keep the ablation lines off the pulmonary veins themselves.  Transmurality was achieved with parallel ablation lines at both sites.  The aorta was crossclamped.  The left ventricle was emptied via the aortic root vent.  Cardiac arrest then was achieved with a combination of cold antegrade blood cardioplegia and topical iced saline. 1.5 L of cardioplegia was administered. There was a rapid diastolic arrest and septal cooling to 10 degrees Celsius.  A reversed saphenous vein graft was placed end-to-side to the posterior descending branch of the right coronary.  This was a 1.5 mm fair quality target vessel.  The vein was good quality.  It was anastomosed end-to- side with a running 7-0 Prolene suture.  All anastomoses were probed proximally and distally at their completion to ensure patency. Cardioplegia was administered down the graft to assess flow and hemostasis, both were good.  Next, a reversed saphenous vein graft was placed end-to-side to large anterolateral diagonal branch of the LAD.  This vessel accepted a 1.5 mm probe.  It was of fair quality.  The vein was good quality.  An end-to- side anastomosis was performed with a running 7-0 Prolene suture.  A probe passed easily proximally and distally.  Both  hemostasis and flow were good with cardioplegia administration.  Next, a reversed saphenous vein graft was placed sequentially to obtuse marginals 3 and 4.  These were both posterolateral branches in close proximity.  OM 3 was fair quality.  OM 4 was good quality vessel.  Both did accept a 1.5 mm probes.  Side-to-side anastomosis was performed to OM 3 and an end-to-side to OM 4, both were done with running 7-0 Prolene sutures.  Both anastomoses probed easily.  Cardioplegia was administered through the graft.  There were good flow and also good hemostasis.  Additional cardioplegia was administered both via the vein grafts and down the aortic root.  The heart was elevated.  The left atrium was exposed.  A 45 mm AtriCure left atrial clip was placed at the base of the left atrium occluding the left atrial appendage.  The left internal mammary artery was brought through a window in the pericardium.  The distal end was beveled, it was anastomosed end-to-side to the distal LAD.  The LAD was 1.5 mm good quality target.  The mammary was of good quality.  The end-to-side anastomosis was performed with a running 8-0 Prolene suture.  At the completion of anastomosis, the bulldog clamp was removed.  Rapid septal rewarming was noted.  The bulldog clamp was replaced.  The mammary pedicle was tacked to the epicardial surface of the heart with 6-0 Prolene sutures.  Additional cardioplegia was administered.  The vein grafts were cut to length.  The cardioplegia cannula was removed from the ascending aorta. The proximal vein graft anastomoses were performed to 4.5 mm punch aortotomies with running 6-0 Prolene sutures at the completion of the final proximal anastomosis.  The patient was placed in Trendelenburg position.  Lidocaine was administered.  The bulldog clamp was again removed from the left mammary artery.  The aortic root was de-aired and the aortic crossclamp was removed.  The total crossclamp  time was 80 minutes.  The patient spontaneously resumed a paced rhythm and did not require defibrillation.  While rewarming was completed, all proximal and distal anastomoses were inspected for hemostasis.  Temporary epicardial pacing wires were placed on the right ventricle and right atrium in case manipulation of rate or pacing sequence was needed in the postoperative period.  The pacer was set to DDD at 80 beats per minute.  A dopamine infusion was started at 3 mcg/kg/min.  When the patient had rewarmed to a core temperature of 37 degrees Celsius, she was weaned from cardiopulmonary bypass on the first attempt.  The total bypass time was 115 minutes.  The initial cardiac index was greater than 2 L/min/sq m, and the patient remained hemodynamically stable throughout the post bypass period.  Post bypass transesophageal echocardiography was unchanged from the prebypass study.  A test dose of protamine was administered and was well tolerated.  The atrial and aortic cannulae were removed.  The remainder of the protamine was administered without incident.  The chest was irrigated with warm saline.  Hemostasis was achieved.  Left pleural and mediastinal chest tubes were placed through separate subcostal incisions.  The pericardium was reapproximated over the aorta and base of the heart with interrupted 3-0 silk sutures.  The sternum was closed with a combination of single and double heavy gauge stainless steel wires.  Pectoralis fascia, subcutaneous tissue, and skin were closed in standard fashion.  All sponge and instrument counts were correct at the end of the procedure. There was a missing needle from a 7-0 Prolene suture.  An x-ray will be performed in the surgical ICU.     Revonda Standard Roxan Hockey, M.D.     SCH/MEDQ  D:  12/04/2016  T:  12/05/2016  Job:  [id number]

## 2016-12-05 NOTE — Progress Notes (Addendum)
Patient ID: [name], female   DOB: [birth year], 71 y.o.   MRN: [id number] EVENING ROUNDS NOTE :     [address]       [place] [address]             [phone]                 1 Day Post-Op Procedure(s) (LRB): CORONARY ARTERY BYPASS GRAFTING (CABG)x 4 WITH ENDOSCOPIC HARVESTING OF RIGHT SAPHENOUS VEIN (N/A) CLIPPING OF ATRIAL APPENDAGE USING ATRICURE 45 ATRICLIP (Left) INTRAOPERATIVE TRANSESOPHAGEAL ECHOCARDIOGRAM (N/A) MAZE PROCEDURE (N/A)  Total Length of Stay:  LOS: 8 days  BP (!) 112/56   Pulse 89   Temp 98.1 F (36.7 C) (Oral)   Resp 12   Ht 5\' 4"  (1.626 m)   Wt 178 lb 9.2 oz (81 kg)   SpO2 95%   BMI 30.65 kg/m   .Intake/Output      05/03 0701 - 05/04 0700 05/04 0701 - 05/05 0700   P.O.     I.V. (mL/kg) 3010.5 (37.2) 396.5 (4.9)   IV Piggyback 1150 200   Total Intake(mL/kg) 4160.5 (51.4) 596.5 (7.4)   Urine (mL/kg/hr) 2940 (1.5) 770 (0.9)   Blood 800 (0.4)    Chest Tube 490 (0.3) 170 (0.2)   Total Output 4230 940   Net -69.5 -343.5          . sodium chloride Stopped (12/05/16 0949)  . sodium chloride    . sodium chloride Stopped (12/04/16 2300)  . cefUROXime (ZINACEF)  IV Stopped (12/05/16 1137)  . dexmedetomidine (PRECEDEX) IV infusion Stopped (12/04/16 2041)  . DOPamine 1.993 mcg/kg/min (12/05/16 1700)  . famotidine (PEPCID) IV Stopped (12/04/16 1902)  . lactated ringers    . lactated ringers Stopped (12/05/16 0200)  . lactated ringers 20 mL/hr at 12/05/16 1700  . nitroGLYCERIN Stopped (12/04/16 1840)  . phenylephrine (NEO-SYNEPHRINE) Adult infusion 15 mcg/min (12/05/16 1704)     Lab Results  Component Value Date   WBC 13.9 (H) 12/05/2016   HGB 8.2 (L) 12/05/2016   HCT 24.0 (L) 12/05/2016   PLT 263 12/05/2016   GLUCOSE 122 (H) 12/05/2016   CHOL 139 11/28/2016   TRIG 88 11/28/2016   HDL 33 (L) 11/28/2016   LDLCALC 88 11/28/2016   ALT 14 11/27/2016   AST 16 11/27/2016   NA 138 12/05/2016   K 4.7 12/05/2016   CL 101  12/05/2016   CREATININE 1.20 (H) 12/05/2016   BUN 22 (H) 12/05/2016   CO2 26 12/05/2016   TSH 4.14 06/16/2016   INR 1.34 12/04/2016   HGBA1C 6.6 06/16/2016   Stable day hgb 7.7 - 8.2 Drops bp when stands or walks  Unable to wean off drips  Grace Isaac MD  [facility] [phone] Office [phone] 12/05/2016 5:39 PM

## 2016-12-06 ENCOUNTER — Inpatient Hospital Stay (HOSPITAL_COMMUNITY): Payer: PPO

## 2016-12-06 LAB — BASIC METABOLIC PANEL
ANION GAP: 10 (ref 5–15)
BUN: 23 mg/dL — AB (ref 6–20)
CO2: 25 mmol/L (ref 22–32)
CREATININE: 1.13 mg/dL — AB (ref 0.44–1.00)
Calcium: 8.3 mg/dL — ABNORMAL LOW (ref 8.9–10.3)
Chloride: 102 mmol/L (ref 101–111)
GFR calc non Af Amer: 49 mL/min — ABNORMAL LOW (ref >60)
GFR, EST AFRICAN AMERICAN: 57 mL/min — AB (ref >60)
GLUCOSE: 107 mg/dL — AB (ref 65–99)
POTASSIUM: 4.3 mmol/L (ref 3.5–5.1)
Sodium: 137 mmol/L (ref 135–145)

## 2016-12-06 LAB — GLUCOSE, CAPILLARY
GLUCOSE-CAPILLARY: 145 mg/dL — AB (ref 65–99)
GLUCOSE-CAPILLARY: 162 mg/dL — AB (ref 65–99)
GLUCOSE-CAPILLARY: 98 mg/dL (ref 65–99)
GLUCOSE-CAPILLARY: 98 mg/dL (ref 65–99)
Glucose-Capillary: 144 mg/dL — ABNORMAL HIGH (ref 65–99)

## 2016-12-06 LAB — CBC
HEMATOCRIT: 24.4 % — AB (ref 36.0–46.0)
Hemoglobin: 7.4 g/dL — ABNORMAL LOW (ref 12.0–15.0)
MCH: 27.5 pg (ref 26.0–34.0)
MCHC: 30.3 g/dL (ref 30.0–36.0)
MCV: 90.7 fL (ref 78.0–100.0)
PLATELETS: 224 10*3/uL (ref 150–400)
RBC: 2.69 MIL/uL — ABNORMAL LOW (ref 3.87–5.11)
RDW: 17 % — AB (ref 11.5–15.5)
WBC: 13 10*3/uL — AB (ref 4.0–10.5)

## 2016-12-06 LAB — PREPARE RBC (CROSSMATCH)

## 2016-12-06 MED ORDER — MIDODRINE HCL 5 MG PO TABS
2.5000 mg | ORAL_TABLET | Freq: Three times a day (TID) | ORAL | Status: DC
  Administered 2016-12-06 – 2016-12-10 (×12): 2.5 mg via ORAL
  Filled 2016-12-06 (×12): qty 1

## 2016-12-06 MED ORDER — DILTIAZEM LOAD VIA INFUSION
5.0000 mg | Freq: Once | INTRAVENOUS | Status: AC
  Administered 2016-12-06: 5 mg via INTRAVENOUS
  Filled 2016-12-06: qty 5

## 2016-12-06 MED ORDER — INSULIN ASPART 100 UNIT/ML ~~LOC~~ SOLN
0.0000 [IU] | Freq: Three times a day (TID) | SUBCUTANEOUS | Status: DC
  Administered 2016-12-07: 2 [IU] via SUBCUTANEOUS
  Administered 2016-12-07 – 2016-12-08 (×4): 3 [IU] via SUBCUTANEOUS
  Administered 2016-12-09 (×2): 2 [IU] via SUBCUTANEOUS
  Administered 2016-12-10: 3 [IU] via SUBCUTANEOUS
  Administered 2016-12-12: 2 [IU] via SUBCUTANEOUS
  Administered 2016-12-13: 3 [IU] via SUBCUTANEOUS
  Administered 2016-12-13: 2 [IU] via SUBCUTANEOUS
  Administered 2016-12-14: 5 [IU] via SUBCUTANEOUS
  Administered 2016-12-14: 2 [IU] via SUBCUTANEOUS
  Administered 2016-12-14: 3 [IU] via SUBCUTANEOUS
  Administered 2016-12-15 (×3): 2 [IU] via SUBCUTANEOUS
  Administered 2016-12-16 (×2): 3 [IU] via SUBCUTANEOUS
  Administered 2016-12-16 – 2016-12-17 (×2): 2 [IU] via SUBCUTANEOUS
  Administered 2016-12-17 – 2016-12-18 (×2): 3 [IU] via SUBCUTANEOUS
  Administered 2016-12-18: 2 [IU] via SUBCUTANEOUS
  Administered 2016-12-18: 5 [IU] via SUBCUTANEOUS
  Administered 2016-12-19 (×2): 3 [IU] via SUBCUTANEOUS
  Administered 2016-12-19: 2 [IU] via SUBCUTANEOUS
  Administered 2016-12-20 (×2): 3 [IU] via SUBCUTANEOUS
  Administered 2016-12-20 – 2016-12-21 (×2): 2 [IU] via SUBCUTANEOUS
  Administered 2016-12-22 – 2016-12-24 (×2): 3 [IU] via SUBCUTANEOUS
  Administered 2016-12-25: 2 [IU] via SUBCUTANEOUS
  Administered 2016-12-26: 3 [IU] via SUBCUTANEOUS
  Administered 2016-12-26 – 2017-01-01 (×5): 2 [IU] via SUBCUTANEOUS
  Administered 2017-01-03: 3 [IU] via SUBCUTANEOUS
  Administered 2017-01-04 (×2): 2 [IU] via SUBCUTANEOUS
  Administered 2017-01-04 – 2017-01-07 (×3): 3 [IU] via SUBCUTANEOUS
  Administered 2017-01-07 – 2017-01-08 (×3): 2 [IU] via SUBCUTANEOUS

## 2016-12-06 MED ORDER — AMIODARONE HCL IN DEXTROSE 360-4.14 MG/200ML-% IV SOLN
60.0000 mg/h | INTRAVENOUS | Status: DC
  Filled 2016-12-06: qty 200

## 2016-12-06 MED ORDER — DILTIAZEM HCL-DEXTROSE 100-5 MG/100ML-% IV SOLN (PREMIX)
5.0000 mg/h | INTRAVENOUS | Status: DC
  Administered 2016-12-06 – 2016-12-07 (×2): 5 mg/h via INTRAVENOUS
  Filled 2016-12-06 (×2): qty 100

## 2016-12-06 MED ORDER — FERROUS SULFATE 325 (65 FE) MG PO TABS
325.0000 mg | ORAL_TABLET | Freq: Two times a day (BID) | ORAL | Status: DC
  Administered 2016-12-06 – 2016-12-09 (×6): 325 mg via ORAL
  Filled 2016-12-06 (×7): qty 1

## 2016-12-06 MED ORDER — AMIODARONE HCL IN DEXTROSE 360-4.14 MG/200ML-% IV SOLN: 30.0000 mg/h | INTRAVENOUS | Status: DC

## 2016-12-06 MED ORDER — FOLIC ACID 1 MG PO TABS
1.0000 mg | ORAL_TABLET | Freq: Every day | ORAL | Status: DC
  Administered 2016-12-06 – 2017-01-08 (×31): 1 mg via ORAL
  Filled 2016-12-06 (×35): qty 1

## 2016-12-06 MED ORDER — AMIODARONE LOAD VIA INFUSION
150.0000 mg | Freq: Once | INTRAVENOUS | Status: DC
  Filled 2016-12-06: qty 83.34

## 2016-12-06 NOTE — Progress Notes (Signed)
Patient does not want amiodarone gtt, states it makes her "nauseated" and to "dry heavy all the time." Dr. Servando Snare paged and informed of pts wishes. New orders given.

## 2016-12-06 NOTE — Progress Notes (Signed)
Patient ambulated in hall using wheelchair and 2 person assist. C/o feeling dizzy and ambulation stopped. Total feet walked = 95.  Upon returning to room, HR became elevated 120-150 showing atrial fibrillation. A-fib with RVR confirmed with EKG. Changes made to pacemaker settings. Dr. Servando Snare notified and new orders received. Will implement and continue to monitor.

## 2016-12-06 NOTE — Progress Notes (Signed)
Patient ID: [name], female   DOB: 02-Nov-1945, 71 y.o.   MRN: [id number] TCTS DAILY ICU PROGRESS NOTE                   [address]            [place] [address]          [phone]   2 Days Post-Op Procedure(s) (LRB): CORONARY ARTERY BYPASS GRAFTING (CABG)x 4 WITH ENDOSCOPIC HARVESTING OF RIGHT SAPHENOUS VEIN (N/A) CLIPPING OF ATRIAL APPENDAGE USING ATRICURE 45 ATRICLIP (Left) INTRAOPERATIVE TRANSESOPHAGEAL ECHOCARDIOGRAM (N/A) MAZE PROCEDURE (N/A)  Total Length of Stay:  LOS: 9 days   Subjective: Awake and alert neuro inatct  Objective: Vital signs in last 24 hours: Temp:  [98.1 F (36.7 C)-99.4 F (37.4 C)] 98.4 F (36.9 C) (05/05 0858) Pulse Rate:  [79-98] 88 (05/05 0915) Cardiac Rhythm: Atrial paced (05/05 0800) Resp:  [0-35] 19 (05/05 0915) BP: (78-131)/(36-70) 127/65 (05/05 0915) SpO2:  [87 %-100 %] 99 % (05/05 0915) Weight:  [177 lb 14.6 oz (80.7 kg)] 177 lb 14.6 oz (80.7 kg) (05/05 0500)  Filed Weights   12/04/16 0447 12/05/16 0500 12/06/16 0500  Weight: 171 lb 1.6 oz (77.6 kg) 178 lb 9.2 oz (81 kg) 177 lb 14.6 oz (80.7 kg)    Weight change: -10.6 oz (-0.3 kg)   Hemodynamic parameters for last 24 hours:    Intake/Output from previous day: 05/04 0701 - 05/05 0700 In: 1151.2 [P.O.:100; I.V.:801.2; IV Piggyback:250] Out: 6606 [Urine:1365; Chest Tube:170]  Intake/Output this shift: Total I/O In: 379.4 [P.O.:240; I.V.:89.4; IV Piggyback:50] Out: 105 [Urine:105]  Current Meds: Scheduled Meds: . acetaminophen  1,000 mg Oral Q6H   Or  . acetaminophen (TYLENOL) oral liquid 160 mg/5 mL  1,000 mg Per Tube Q6H  . aspirin EC  325 mg Oral Daily   Or  . aspirin  324 mg Per Tube Daily  . atorvastatin  80 mg Oral q1800  . bisacodyl  10 mg Oral Daily   Or  . bisacodyl  10 mg Rectal Daily  . docusate sodium  200 mg Oral Daily  . enoxaparin (LOVENOX) injection  40 mg Subcutaneous QHS  . furosemide  20 mg Intravenous BID  . insulin  aspart  0-24 Units Subcutaneous Q4H  . insulin detemir  10 Units Subcutaneous BID  . metoprolol tartrate  12.5 mg Oral BID   Or  . metoprolol tartrate  12.5 mg Per Tube BID  . pantoprazole  40 mg Oral Daily  . sodium chloride flush  3 mL Intravenous Q12H   Continuous Infusions: . sodium chloride Stopped (12/05/16 0949)  . sodium chloride    . sodium chloride Stopped (12/04/16 2300)  . cefUROXime (ZINACEF)  IV 1.5 g (12/06/16 0934)  . dexmedetomidine (PRECEDEX) IV infusion Stopped (12/04/16 2041)  . DOPamine 2 mcg/kg/min (12/06/16 0900)  . lactated ringers    . lactated ringers Stopped (12/05/16 0200)  . lactated ringers 20 mL/hr at 12/06/16 0900  . nitroGLYCERIN Stopped (12/04/16 1840)  . phenylephrine (NEO-SYNEPHRINE) Adult infusion 6 mcg/min (12/06/16 0933)   PRN Meds:.sodium chloride, lactated ringers, metoCLOPramide (REGLAN) injection, metoprolol, midazolam, morphine injection, ondansetron (ZOFRAN) IV, oxyCODONE, sodium chloride flush, traMADol  General appearance: alert and cooperative Neurologic: intact Heart: regular rate and rhythm, S1, S2 normal, no murmur, click, rub or gallop Lungs: diminished breath sounds bibasilar Abdomen: normal findings: aorta normal Extremities: extremities normal, atraumatic, no cyanosis or edema and Homans sign is negative, no sign of  DVT Wound: sternbum ok  Lab Results: CBC: Recent Labs  12/05/16 1602 12/05/16 1612 12/06/16 0401  WBC 13.9*  --  13.0*  HGB 7.7* 8.2* 7.4*  HCT 25.6* 24.0* 24.4*  PLT 263  --  224   BMET:  Recent Labs  12/05/16 0452  12/05/16 1612 12/06/16 0401  NA 138  --  138 137  K 4.2  --  4.7 4.3  CL 102  --  101 102  CO2 26  --   --  25  GLUCOSE 110*  --  122* 107*  BUN 20  --  22* 23*  CREATININE 1.17*  < > 1.20* 1.13*  CALCIUM 8.3*  --   --  8.3*  < > = values in this interval not displayed.  CMET: Lab Results  Component Value Date   WBC 13.0 (H) 12/06/2016   HGB 7.4 (L) 12/06/2016   HCT 24.4 (L)  12/06/2016   PLT 224 12/06/2016   GLUCOSE 107 (H) 12/06/2016   CHOL 139 11/28/2016   TRIG 88 11/28/2016   HDL 33 (L) 11/28/2016   LDLCALC 88 11/28/2016   ALT 14 11/27/2016   AST 16 11/27/2016   NA 137 12/06/2016   K 4.3 12/06/2016   CL 102 12/06/2016   CREATININE 1.13 (H) 12/06/2016   BUN 23 (H) 12/06/2016   CO2 25 12/06/2016   TSH 4.14 06/16/2016   INR 1.34 12/04/2016   HGBA1C 6.6 06/16/2016      PT/INR:  Recent Labs  12/04/16 1707  LABPROT 16.7*  INR 1.34   Radiology: Dg Chest Port 1 View  Result Date: 12/06/2016 CLINICAL DATA:  71 year old female postoperative day 2 status post CABG on 12/04/2016 EXAM: PORTABLE CHEST 1 VIEW COMPARISON:  12/05/2016 and earlier. FINDINGS: Portable AP semi upright view at 0503 hours. Left chest tube and mediastinal drain have been removed. Right IJ approach Swan-Ganz catheter has been removed, introducer sheath remains. No pneumothorax. Mildly increased veiling opacity at the left lung base. Mildly larger lung volumes. Stable pulmonary vascularity. Stable cardiac size and mediastinal contours. Stable left chest cardiac AICD. IMPRESSION: 1. Lines and tubes except for the right IJ introducer sheath removed. No pneumothorax. 2. Mildly increased left lung base atelectasis and/or small effusion. Electronically Signed   By: Genevie Ann M.D.   On: 12/06/2016 07:24   Chronic Kidney Disease   Stage I     GFR >90  Stage II    GFR 60-89  Stage IIIA GFR 45-59  Stage IIIB GFR 30-44  Stage IV   GFR 15-29  Stage V    GFR  <15  Lab Results  Component Value Date   CREATININE 1.13 (H) 12/06/2016   Estimated Creatinine Clearance: 49 mL/min (A) (by C-G formula based on SCr of 1.13 mg/dL (H)).   Assessment/Plan: S/P Procedure(s) (LRB): CORONARY ARTERY BYPASS GRAFTING (CABG)x 4 WITH ENDOSCOPIC HARVESTING OF RIGHT SAPHENOUS VEIN (N/A) CLIPPING OF ATRIAL APPENDAGE USING ATRICURE 45 ATRICLIP (Left) INTRAOPERATIVE TRANSESOPHAGEAL ECHOCARDIOGRAM (N/A) MAZE  PROCEDURE (N/A) Mobilize Diuresis Diabetes control bp better , was on Midrin in past will add back  Mild diuresis  A paced  D/c foley   Grace Isaac 12/06/2016 9:55 AM

## 2016-12-06 NOTE — Progress Notes (Signed)
Patient ID: [name], female   DOB: 1945/11/09, 71 y.o.   MRN: [id number] EVENING ROUNDS NOTE :     [address]       [place] [address]             [phone]                 2 Days Post-Op Procedure(s) (LRB): CORONARY ARTERY BYPASS GRAFTING (CABG)x 4 WITH ENDOSCOPIC HARVESTING OF RIGHT SAPHENOUS VEIN (N/A) CLIPPING OF ATRIAL APPENDAGE USING ATRICURE 45 ATRICLIP (Left) INTRAOPERATIVE TRANSESOPHAGEAL ECHOCARDIOGRAM (N/A) MAZE PROCEDURE (N/A)  Total Length of Stay:  LOS: 9 days  BP (!) 100/50   Pulse 89   Temp 98.2 F (36.8 C) (Oral)   Resp 19   Ht 5\' 4"  (1.626 m)   Wt 177 lb 14.6 oz (80.7 kg)   SpO2 99%   BMI 30.54 kg/m   .Intake/Output      05/05 0701 - 05/06 0700   P.O. 360   I.V. (mL/kg) 335.1 (4.2)   IV Piggyback 50   Total Intake(mL/kg) 745.1 (9.2)   Urine (mL/kg/hr) 215 (0.2)   Chest Tube    Total Output 215   Net +530.1         . sodium chloride Stopped (12/05/16 0949)  . sodium chloride    . sodium chloride Stopped (12/04/16 2300)  . dexmedetomidine (PRECEDEX) IV infusion Stopped (12/04/16 2041)  . diltiazem (CARDIZEM) infusion 5 mg/hr (12/06/16 1745)  . DOPamine 2 mcg/kg/min (12/06/16 1900)  . lactated ringers    . lactated ringers Stopped (12/05/16 0200)  . lactated ringers 20 mL/hr at 12/06/16 1900  . nitroGLYCERIN Stopped (12/04/16 1840)  . phenylephrine (NEO-SYNEPHRINE) Adult infusion 8 mcg/min (12/06/16 1900)     Lab Results  Component Value Date   WBC 13.0 (H) 12/06/2016   HGB 7.4 (L) 12/06/2016   HCT 24.4 (L) 12/06/2016   PLT 224 12/06/2016   GLUCOSE 107 (H) 12/06/2016   CHOL 139 11/28/2016   TRIG 88 11/28/2016   HDL 33 (L) 11/28/2016   LDLCALC 88 11/28/2016   ALT 14 11/27/2016   AST 16 11/27/2016   NA 137 12/06/2016   K 4.3 12/06/2016   CL 102 12/06/2016   CREATININE 1.13 (H) 12/06/2016   BUN 23 (H) 12/06/2016   CO2 25 12/06/2016   TSH 4.14 06/16/2016   INR 1.34 12/04/2016   HGBA1C 6.6 06/16/2016    Not voiding with foley out, small amt in bladder on scan, will replace foley to monitor uop  When tried to walk developed rapid afib, refused coradrone , on cardizem drip to slow rate but have had to resume neo With low hct , poor lv function, will transfuse prbsc  Grace Isaac MD  [place] 12/06/2016 7:37 PM

## 2016-12-07 ENCOUNTER — Inpatient Hospital Stay (HOSPITAL_COMMUNITY): Payer: PPO

## 2016-12-07 DIAGNOSIS — Z951 Presence of aortocoronary bypass graft: Secondary | ICD-10-CM

## 2016-12-07 LAB — GLUCOSE, CAPILLARY
GLUCOSE-CAPILLARY: 146 mg/dL — AB (ref 65–99)
GLUCOSE-CAPILLARY: 174 mg/dL — AB (ref 65–99)
GLUCOSE-CAPILLARY: 148 mg/dL — AB (ref 65–99)
Glucose-Capillary: 141 mg/dL — ABNORMAL HIGH (ref 65–99)
Glucose-Capillary: 122 mg/dL — ABNORMAL HIGH (ref 65–99)

## 2016-12-07 LAB — BASIC METABOLIC PANEL
Anion gap: 10 (ref 5–15)
BUN: 33 mg/dL — ABNORMAL HIGH (ref 6–20)
CO2: 23 mmol/L (ref 22–32)
Calcium: 8.2 mg/dL — ABNORMAL LOW (ref 8.9–10.3)
Chloride: 101 mmol/L (ref 101–111)
Creatinine, Ser: 1.31 mg/dL — ABNORMAL HIGH (ref 0.44–1.00)
GFR calc Af Amer: 47 mL/min — ABNORMAL LOW (ref >60)
GFR calc non Af Amer: 41 mL/min — ABNORMAL LOW (ref >60)
Glucose, Bld: 152 mg/dL — ABNORMAL HIGH (ref 65–99)
Potassium: 4.5 mmol/L (ref 3.5–5.1)
Sodium: 134 mmol/L — ABNORMAL LOW (ref 135–145)

## 2016-12-07 LAB — TYPE AND SCREEN
ABO/RH(D): A NEG
ANTIBODY SCREEN: NEGATIVE
UNIT DIVISION: 0
UNIT DIVISION: 0
Unit division: 0
Unit division: 0
Unit division: 0
Unit division: 0

## 2016-12-07 LAB — BPAM RBC
BLOOD PRODUCT EXPIRATION DATE: 201805242359
BLOOD PRODUCT EXPIRATION DATE: 201805252359
BLOOD PRODUCT EXPIRATION DATE: 201805252359
Blood Product Expiration Date: 201805142359
Blood Product Expiration Date: 201805162359
Blood Product Expiration Date: 201805242359
ISSUE DATE / TIME: 201805031209
ISSUE DATE / TIME: 201805031209
ISSUE DATE / TIME: 201805052054
ISSUE DATE / TIME: 201805031209
UNIT TYPE AND RH: 600
UNIT TYPE AND RH: 600
Unit Type and Rh: 600
Unit Type and Rh: 600
Unit Type and Rh: 600
Unit Type and Rh: 600

## 2016-12-07 LAB — COOXEMETRY PANEL
CARBOXYHEMOGLOBIN: 1.5 % (ref 0.5–1.5)
Carboxyhemoglobin: 1.9 % — ABNORMAL HIGH (ref 0.5–1.5)
Methemoglobin: 1 % (ref 0.0–1.5)
Methemoglobin: 1.4 % (ref 0.0–1.5)
O2 SAT: 52 %
O2 Saturation: 77 %
Total hemoglobin: 5.3 g/dL — CL (ref 12.0–16.0)
Total hemoglobin: 8.9 g/dL — ABNORMAL LOW (ref 12.0–16.0)

## 2016-12-07 LAB — CBC
HCT: 38.5 % (ref 36.0–46.0)
Hemoglobin: 11.9 g/dL — ABNORMAL LOW (ref 12.0–15.0)
MCH: 27.7 pg (ref 26.0–34.0)
MCHC: 30.9 g/dL (ref 30.0–36.0)
MCV: 89.5 fL (ref 78.0–100.0)
Platelets: 196 10*3/uL (ref 150–400)
RBC: 4.3 MIL/uL (ref 3.87–5.11)
RDW: 17.1 % — ABNORMAL HIGH (ref 11.5–15.5)
WBC: 9.6 10*3/uL (ref 4.0–10.5)

## 2016-12-07 LAB — HEMOGLOBIN A1C
Hgb A1c MFr Bld: 7 % — ABNORMAL HIGH (ref 4.8–5.6)
MEAN PLASMA GLUCOSE: 154 mg/dL

## 2016-12-07 MED ORDER — AMIODARONE LOAD VIA INFUSION
150.0000 mg | Freq: Once | INTRAVENOUS | Status: AC
  Administered 2016-12-07: 150 mg via INTRAVENOUS
  Filled 2016-12-07: qty 83.34

## 2016-12-07 MED ORDER — CHLORHEXIDINE GLUCONATE CLOTH 2 % EX PADS
6.0000 | MEDICATED_PAD | Freq: Every day | CUTANEOUS | Status: DC
  Administered 2016-12-08 – 2016-12-10 (×3): 6 via TOPICAL

## 2016-12-07 MED ORDER — ORAL CARE MOUTH RINSE
15.0000 mL | Freq: Two times a day (BID) | OROMUCOSAL | Status: DC
  Administered 2016-12-07 – 2016-12-23 (×10): 15 mL via OROMUCOSAL

## 2016-12-07 MED ORDER — SODIUM CHLORIDE 0.9% FLUSH
10.0000 mL | Freq: Two times a day (BID) | INTRAVENOUS | Status: DC
  Administered 2016-12-07: 20 mL
  Administered 2016-12-07 – 2016-12-10 (×4): 10 mL

## 2016-12-07 MED ORDER — AMIODARONE HCL IN DEXTROSE 360-4.14 MG/200ML-% IV SOLN
30.0000 mg/h | INTRAVENOUS | Status: DC
  Administered 2016-12-08 (×2): 30 mg/h via INTRAVENOUS
  Filled 2016-12-07 (×3): qty 200

## 2016-12-07 MED ORDER — AMIODARONE HCL IN DEXTROSE 360-4.14 MG/200ML-% IV SOLN
60.0000 mg/h | INTRAVENOUS | Status: AC
  Administered 2016-12-07 (×2): 60 mg/h via INTRAVENOUS
  Filled 2016-12-07 (×2): qty 200

## 2016-12-07 MED ORDER — SODIUM CHLORIDE 0.9% FLUSH: 10.0000 mL | INTRAVENOUS | Status: DC | PRN

## 2016-12-07 NOTE — Progress Notes (Signed)
CRITICAL VALUE ALERT  Critical value received:  Cooxemetry Hgb 5.3  Date of notification:  12/07/2016  Time of notification:  817  Critical value read back: YES  Nurse who received alert:  Flossie Buffy, RN  *Value inconsistent with CBC results (11.9).  Will redraw.

## 2016-12-07 NOTE — Progress Notes (Signed)
DAILY PROGRESS NOTE   Patient Name: [name] Date of Encounter: 12/07/2016  Hospital Problem List   Principal Problem:   Ventricular fibrillation [facility]) Active Problems:   Atrial fibrillation with rapid ventricular response ([name])   CAD (coronary artery disease), native coronary artery - 3 vessel   NSTEMI (non-ST elevated myocardial infarction) ([place])   Acute on chronic diastolic (congestive) heart failure ([name])   S/P CABG x 5   Pressure injury of skin    Chief Complaint   Dizziness  Subjective   In rapid a-fib today - on diltiazem gtts, but BP soft. On midodrine and neosynephrine and dopamine. Has refused amiodarone in the past - was on mexiletine for VF, but that was discontinued after revascularization.   Objective   Vitals:   12/07/16 0845 12/07/16 0900 12/07/16 0915 12/07/16 0930  BP: 110/63 (!) 86/42 116/64 110/66  Pulse: 71  69 97  Resp: 16 18 (!) 23 (!) 23  Temp: 98.5 F (36.9 C)     TempSrc: Oral     SpO2: 97% 97% 96% 97%  Weight:      Height:        Intake/Output Summary (Last 24 hours) at 12/07/16 1000 Last data filed at 12/07/16 0900  Gross per 24 hour  Intake          1215.35 ml  Output              560 ml  Net           655.35 ml   Filed Weights   12/05/16 0500 12/06/16 0500 12/07/16 0600  Weight: 178 lb 9.2 oz (81 kg) 177 lb 14.6 oz (80.7 kg) 178 lb 11.2 oz (81.1 kg)    Physical Exam   General appearance: alert, mild distress and feels nauseated Neck: no carotid bruit and no JVD Lungs: diminished breath sounds bilaterally Heart: irregularly irregular rhythm Abdomen: soft, non-tender; bowel sounds normal; no masses,  no organomegaly Extremities: extremities normal, atraumatic, no cyanosis or edema Pulses: 2+ and symmetric Skin: pale, warm, dry Neurologic: Mental status: Alert, oriented, thought content appropriate Psych: Appears uncomfortable, anxious, nauseated  Inpatient Medications    Scheduled Meds: . acetaminophen  1,000  mg Oral Q6H   Or  . acetaminophen (TYLENOL) oral liquid 160 mg/5 mL  1,000 mg Per Tube Q6H  . aspirin EC  325 mg Oral Daily   Or  . aspirin  324 mg Per Tube Daily  . atorvastatin  80 mg Oral q1800  . bisacodyl  10 mg Oral Daily   Or  . bisacodyl  10 mg Rectal Daily  . docusate sodium  200 mg Oral Daily  . enoxaparin (LOVENOX) injection  40 mg Subcutaneous QHS  . ferrous sulfate  325 mg Oral BID WC  . folic acid  1 mg Oral Daily  . furosemide  20 mg Intravenous BID  . insulin aspart  0-15 Units Subcutaneous TID WC  . mouth rinse  15 mL Mouth Rinse BID  . metoprolol tartrate  12.5 mg Oral BID   Or  . metoprolol tartrate  12.5 mg Per Tube BID  . midodrine  2.5 mg Oral TID WC  . pantoprazole  40 mg Oral Daily  . sodium chloride flush  3 mL Intravenous Q12H    Continuous Infusions: . sodium chloride Stopped (12/05/16 0949)  . sodium chloride    . sodium chloride Stopped (12/04/16 2300)  . dexmedetomidine (PRECEDEX) IV infusion Stopped (12/04/16 2041)  . diltiazem (  CARDIZEM) infusion 5 mg/hr (12/07/16 0900)  . DOPamine 2 mcg/kg/min (12/07/16 0900)  . lactated ringers    . lactated ringers Stopped (12/05/16 0200)  . lactated ringers 20 mL/hr at 12/07/16 0900  . nitroGLYCERIN Stopped (12/04/16 1840)  . phenylephrine (NEO-SYNEPHRINE) Adult infusion 15 mcg/min (12/07/16 0909)    PRN Meds: sodium chloride, lactated ringers, metoCLOPramide (REGLAN) injection, metoprolol, midazolam, morphine injection, ondansetron (ZOFRAN) IV, oxyCODONE, sodium chloride flush, traMADol   Labs   Results for orders placed or performed during the hospital encounter of 11/27/16 (from the past 48 hour(s))  Glucose, capillary     Status: None   Collection Time: 12/05/16 10:06 AM  Result Value Ref Range   Glucose-Capillary 79 65 - 99 mg/dL   Comment 1 Capillary Specimen   Glucose, capillary     Status: None   Collection Time: 12/05/16 11:09 AM  Result Value Ref Range   Glucose-Capillary 96 65 - 99  mg/dL   Comment 1 Capillary Specimen   Glucose, capillary     Status: Abnormal   Collection Time: 12/05/16 11:51 AM  Result Value Ref Range   Glucose-Capillary 100 (H) 65 - 99 mg/dL   Comment 1 Capillary Specimen   Glucose, capillary     Status: None   Collection Time: 12/05/16 12:54 PM  Result Value Ref Range   Glucose-Capillary 96 65 - 99 mg/dL   Comment 1 Capillary Specimen   Glucose, capillary     Status: Abnormal   Collection Time: 12/05/16  1:42 PM  Result Value Ref Range   Glucose-Capillary 107 (H) 65 - 99 mg/dL   Comment 1 Capillary Specimen   Glucose, capillary     Status: Abnormal   Collection Time: 12/05/16  3:17 PM  Result Value Ref Range   Glucose-Capillary 104 (H) 65 - 99 mg/dL   Comment 1 Notify RN   Magnesium     Status: Abnormal   Collection Time: 12/05/16  4:02 PM  Result Value Ref Range   Magnesium 2.8 (H) 1.7 - 2.4 mg/dL  CBC     Status: Abnormal   Collection Time: 12/05/16  4:02 PM  Result Value Ref Range   WBC 13.9 (H) 4.0 - 10.5 K/uL   RBC 2.81 (L) 3.87 - 5.11 MIL/uL   Hemoglobin 7.7 (L) 12.0 - 15.0 g/dL   HCT 25.6 (L) 36.0 - 46.0 %   MCV 91.1 78.0 - 100.0 fL   MCH 27.4 26.0 - 34.0 pg   MCHC 30.1 30.0 - 36.0 g/dL   RDW 16.8 (H) 11.5 - 15.5 %   Platelets 263 150 - 400 K/uL  Creatinine, serum     Status: Abnormal   Collection Time: 12/05/16  4:02 PM  Result Value Ref Range   Creatinine, Ser 1.17 (H) 0.44 - 1.00 mg/dL   GFR calc non Af Amer 47 (L) >60 mL/min   GFR calc Af Amer 54 (L) >60 mL/min    Comment: (NOTE) The eGFR has been calculated using the CKD EPI equation. This calculation has not been validated in all clinical situations. eGFR's persistently <60 mL/min signify possible Chronic Kidney Disease.   I-STAT, chem 8     Status: Abnormal   Collection Time: 12/05/16  4:12 PM  Result Value Ref Range   Sodium 138 135 - 145 mmol/L   Potassium 4.7 3.5 - 5.1 mmol/L   Chloride 101 101 - 111 mmol/L   BUN 22 (H) 6 - 20 mg/dL   Creatinine, Ser  1.20 (H) 0.44 -  1.00 mg/dL   Glucose, Bld 122 (H) 65 - 99 mg/dL   Calcium, Ion 1.17 1.15 - 1.40 mmol/L   TCO2 25 0 - 100 mmol/L   Hemoglobin 8.2 (L) 12.0 - 15.0 g/dL   HCT 24.0 (L) 36.0 - 46.0 %  Glucose, capillary     Status: Abnormal   Collection Time: 12/05/16  7:29 PM  Result Value Ref Range   Glucose-Capillary 112 (H) 65 - 99 mg/dL   Comment 1 Capillary Specimen    Comment 2 Notify RN   Glucose, capillary     Status: Abnormal   Collection Time: 12/06/16 12:07 AM  Result Value Ref Range   Glucose-Capillary 162 (H) 65 - 99 mg/dL  Basic metabolic panel     Status: Abnormal   Collection Time: 12/06/16  4:01 AM  Result Value Ref Range   Sodium 137 135 - 145 mmol/L   Potassium 4.3 3.5 - 5.1 mmol/L   Chloride 102 101 - 111 mmol/L   CO2 25 22 - 32 mmol/L   Glucose, Bld 107 (H) 65 - 99 mg/dL   BUN 23 (H) 6 - 20 mg/dL   Creatinine, Ser 1.13 (H) 0.44 - 1.00 mg/dL   Calcium 8.3 (L) 8.9 - 10.3 mg/dL   GFR calc non Af Amer 49 (L) >60 mL/min   GFR calc Af Amer 57 (L) >60 mL/min    Comment: (NOTE) The eGFR has been calculated using the CKD EPI equation. This calculation has not been validated in all clinical situations. eGFR's persistently <60 mL/min signify possible Chronic Kidney Disease.    Anion gap 10 5 - 15  CBC     Status: Abnormal   Collection Time: 12/06/16  4:01 AM  Result Value Ref Range   WBC 13.0 (H) 4.0 - 10.5 K/uL   RBC 2.69 (L) 3.87 - 5.11 MIL/uL   Hemoglobin 7.4 (L) 12.0 - 15.0 g/dL   HCT 24.4 (L) 36.0 - 46.0 %   MCV 90.7 78.0 - 100.0 fL   MCH 27.5 26.0 - 34.0 pg   MCHC 30.3 30.0 - 36.0 g/dL   RDW 17.0 (H) 11.5 - 15.5 %   Platelets 224 150 - 400 K/uL  Glucose, capillary     Status: None   Collection Time: 12/06/16  4:37 AM  Result Value Ref Range   Glucose-Capillary 98 65 - 99 mg/dL   Comment 1 Capillary Specimen    Comment 2 Notify RN   Glucose, capillary     Status: None   Collection Time: 12/06/16  8:55 AM  Result Value Ref Range   Glucose-Capillary  98 65 - 99 mg/dL  Glucose, capillary     Status: Abnormal   Collection Time: 12/06/16 12:04 PM  Result Value Ref Range   Glucose-Capillary 144 (H) 65 - 99 mg/dL   Comment 1 Capillary Specimen    Comment 2 Notify RN    Comment 3 Document in Chart   Glucose, capillary     Status: Abnormal   Collection Time: 12/06/16  3:57 PM  Result Value Ref Range   Glucose-Capillary 145 (H) 65 - 99 mg/dL   Comment 1 Capillary Specimen    Comment 2 Notify RN    Comment 3 Document in Chart   Glucose, capillary     Status: Abnormal   Collection Time: 12/06/16  7:48 PM  Result Value Ref Range   Glucose-Capillary 146 (H) 65 - 99 mg/dL   Comment 1 Capillary Specimen    Comment 2 Notify  RN   Prepare RBC     Status: None   Collection Time: 12/06/16  8:00 PM  Result Value Ref Range   Order Confirmation BB SAMPLE OR UNITS ALREADY AVAILABLE   Cooxemetry Panel (carboxy, met, total hgb, O2 sat)     Status: Abnormal   Collection Time: 12/07/16  3:35 AM  Result Value Ref Range   Total hemoglobin 5.3 (LL) 12.0 - 16.0 g/dL    Comment: CRITICAL RESULT CALLED TO, READ BACK BY AND VERIFIED WITH: Dyane Dustman RN AT [phone] BY Berneice Gandy RRT, RCP ON 5/ CRITICAL RESULT CALLED TO, READ BACK BY AND VERIFIED WITH:  Dyane Dustman RN AT [phone] BY Berneice Gandy RRT, RCP ON 12/07/2016    O2 Saturation 77.0 %   Carboxyhemoglobin 1.9 (H) 0.5 - 1.5 %   Methemoglobin 1.0 0.0 - 1.5 %  Basic metabolic panel     Status: Abnormal   Collection Time: 12/07/16  3:37 AM  Result Value Ref Range   Sodium 134 (L) 135 - 145 mmol/L   Potassium 4.5 3.5 - 5.1 mmol/L   Chloride 101 101 - 111 mmol/L   CO2 23 22 - 32 mmol/L   Glucose, Bld 152 (H) 65 - 99 mg/dL   BUN 33 (H) 6 - 20 mg/dL   Creatinine, Ser 1.31 (H) 0.44 - 1.00 mg/dL   Calcium 8.2 (L) 8.9 - 10.3 mg/dL   GFR calc non Af Amer 41 (L) >60 mL/min   GFR calc Af Amer 47 (L) >60 mL/min    Comment: (NOTE) The eGFR has been calculated using the CKD EPI equation. This calculation has  not been validated in all clinical situations. eGFR's persistently <60 mL/min signify possible Chronic Kidney Disease.    Anion gap 10 5 - 15  CBC     Status: Abnormal   Collection Time: 12/07/16  3:37 AM  Result Value Ref Range   WBC 9.6 4.0 - 10.5 K/uL   RBC 4.30 3.87 - 5.11 MIL/uL   Hemoglobin 11.9 (L) 12.0 - 15.0 g/dL    Comment: REPEATED TO VERIFY POST TRANSFUSION SPECIMEN    HCT 38.5 36.0 - 46.0 %   MCV 89.5 78.0 - 100.0 fL   MCH 27.7 26.0 - 34.0 pg   MCHC 30.9 30.0 - 36.0 g/dL   RDW 17.1 (H) 11.5 - 15.5 %   Platelets 196 150 - 400 K/uL  .Cooxemetry Panel (carboxy, met, total hgb, O2 sat)     Status: Abnormal   Collection Time: 12/07/16  5:17 AM  Result Value Ref Range   Total hemoglobin 8.9 (L) 12.0 - 16.0 g/dL   O2 Saturation 52.0 %   Carboxyhemoglobin 1.5 0.5 - 1.5 %   Methemoglobin 1.4 0.0 - 1.5 %  Glucose, capillary     Status: Abnormal   Collection Time: 12/07/16  8:46 AM  Result Value Ref Range   Glucose-Capillary 141 (H) 65 - 99 mg/dL   Comment 1 Notify RN     ECG   None today  Telemetry   A-fib with RVR - Personally Reviewed  Radiology    Dg Chest Port 1 View  Result Date: 12/07/2016 CLINICAL DATA:  Chest tube in place. EXAM: PORTABLE CHEST 1 VIEW COMPARISON:  Radiograph of Dec 06, 2016. FINDINGS: Stable cardiomegaly. Status post coronary bypass graft. Left-sided pacemaker is unchanged in position. No pneumothorax is noted. No chest tube is noted. Mild bibasilar opacities are noted concerning for edema or atelectasis with minimal associated pleural effusions. Bony thorax  is unremarkable. Right internal jugular venous sheath is unchanged in position. IMPRESSION: Mild bibasilar opacities are noted concerning for edema or atelectasis with minimal associated pleural effusions. Electronically Signed   By: Marijo Conception, M.D.   On: 12/07/2016 07:22   Dg Chest Port 1 View  Result Date: 12/06/2016 CLINICAL DATA:  71 year old female postoperative day 2 status  post CABG on 12/04/2016 EXAM: PORTABLE CHEST 1 VIEW COMPARISON:  12/05/2016 and earlier. FINDINGS: Portable AP semi upright view at 0503 hours. Left chest tube and mediastinal drain have been removed. Right IJ approach Swan-Ganz catheter has been removed, introducer sheath remains. No pneumothorax. Mildly increased veiling opacity at the left lung base. Mildly larger lung volumes. Stable pulmonary vascularity. Stable cardiac size and mediastinal contours. Stable left chest cardiac AICD. IMPRESSION: 1. Lines and tubes except for the right IJ introducer sheath removed. No pneumothorax. 2. Mildly increased left lung base atelectasis and/or small effusion. Electronically Signed   By: Genevie Ann M.D.   On: 12/06/2016 07:24    Cardiac Studies   Conclusion    Severe three-vessel native coronary disease with chronic total occlusion of the RCA supplied by left-to-right collaterals. Inferior wall branches are graftable.  Total occlusion of the mid circumflex with retrograde filling of 2 large obtuse marginal branches. Branches appear graftable.  Severe LAD and diagonal disease with subtotal occlusion in the mid vessel of the LAD. 90% stenosis in the proximal portion of large diagonal.  Elevated left ventricular end-diastolic pressure during tachycardia.  Sustained, hemodynamically stable ventricular tachycardia during the procedure that required pace termination via the AICD.  RECOMMENDATIONS:   Assessment of LV function by CT and/or echo with specific attention to the presence absence of an LV aneurysm involving the inferior wall.  TCTS notified of need for CABG and ? Aneurysm resection if present.    Assessment   1. Principal Problem: 2.   Ventricular fibrillation ([place]) 3. Active Problems: 4.   Atrial fibrillation with rapid ventricular response ([place]) 5.   CAD (coronary artery disease), native coronary artery - 3 vessel 6.   NSTEMI (non-ST elevated myocardial infarction) ([place]) 7.   Acute on  chronic diastolic (congestive) heart failure ([place]) 8.   S/P CABG x 5 9.   Pressure injury of skin 10.   Plan   1. On diltizem for rate control with a-fib - also with NSVT overnight. Recent AICD shocks on admission for VF. She is requiring neosynephrine, dopamine and midodrine for BP at this point. We discussed her intolerance to amiodarone in the past, which was nausea (on pills) - she understands in the short term this is the best medicine for her to help [place] wean dopamine (preferentially), which is contributing to her nausea. Will start IV amiodarone, load and infusion - ok to wean down diltiazem after loading with goal to wean off. As BP allows, try to preferentially wean off dopamine.  Time Spent Directly with Patient:  15 minutes  Length of Stay:  LOS: 10 days   Pixie Casino, MD, [place]  Attending Cardiologist  Direct Dial: [phone]  Fax: [phone]  Website:  [email or web address]  Nadean Corwin [facility] 12/07/2016, 10:00 AM

## 2016-12-07 NOTE — Progress Notes (Signed)
Spoke with Dr Nyoka Cowden , radiology.  States PICC is in the [place].  PICC is okay to use.

## 2016-12-07 NOTE — Progress Notes (Signed)
Peripherally Inserted Central Catheter/Midline Placement  The IV Nurse has discussed with the patient and/or persons authorized to consent for the patient, the purpose of this procedure and the potential benefits and risks involved with this procedure.  The benefits include less needle sticks, lab draws from the catheter, and the patient may be discharged home with the catheter. Risks include, but not limited to, infection, bleeding, blood clot (thrombus formation), and puncture of an artery; nerve damage and irregular heartbeat and possibility to perform a PICC exchange if needed/ordered by physician.  Alternatives to this procedure were also discussed.  Bard Power PICC patient education guide, fact sheet on infection prevention and patient information card has been provided to patient /or left at bedside.    PICC/Midline Placement Documentation  PICC Double Lumen 84/69/62 PICC Right Basilic 37 cm 0 cm (Active)  Indication for Insertion or Continuance of Line Vasoactive infusions;Prolonged intravenous therapies 12/07/2016  1:06 PM  Exposed Catheter (cm) 0 cm 12/07/2016  1:06 PM  Site Assessment Clean;Dry;Intact 12/07/2016  1:06 PM  Lumen #1 Status Flushed;Saline locked;Blood return noted 12/07/2016  1:06 PM  Lumen #2 Status Flushed;Saline locked;Blood return noted 12/07/2016  1:06 PM  Dressing Type Transparent 12/07/2016  1:06 PM  Dressing Status Clean;Dry;Intact;Antimicrobial disc in place 12/07/2016  1:06 PM  Line Care Connections checked and tightened 12/07/2016  1:06 PM  Line Adjustment (NICU/IV Team Only) No 12/07/2016  1:06 PM  Dressing Intervention New dressing 12/07/2016  1:06 PM  Dressing Change Due 12/14/16 12/07/2016  1:06 PM       Rolena Infante 12/07/2016, 1:07 PM

## 2016-12-07 NOTE — Progress Notes (Signed)
Patient ID: LIBORIA PUTNAM, female   DOB: 1946-01-11, 71 y.o.   MRN: [id number] EVENING ROUNDS NOTE :     [address]       [place] [address]             [phone]                 3 Days Post-Op Procedure(s) (LRB): CORONARY ARTERY BYPASS GRAFTING (CABG)x 4 WITH ENDOSCOPIC HARVESTING OF RIGHT SAPHENOUS VEIN (N/A) CLIPPING OF ATRIAL APPENDAGE USING ATRICURE 45 ATRICLIP (Left) INTRAOPERATIVE TRANSESOPHAGEAL ECHOCARDIOGRAM (N/A) MAZE PROCEDURE (N/A)  Total Length of Stay:  LOS: 10 days  BP 115/77   Pulse (!) 55   Temp 97.8 F (36.6 C) (Oral)   Resp 20   Ht 5\' 4"  (1.626 m)   Wt 178 lb 11.2 oz (81.1 kg)   SpO2 100%   BMI 30.67 kg/m   .Intake/Output      05/06 0701 - 05/07 0700   P.O. 210   I.V. (mL/kg) 720.7 (8.9)   Blood    IV Piggyback    Total Intake(mL/kg) 930.7 (11.5)   Urine (mL/kg/hr) 270 (0.3)   Total Output 270   Net +660.7         . sodium chloride Stopped (12/05/16 0949)  . sodium chloride    . sodium chloride 10 mL/hr at 12/07/16 1900  . amiodarone 30 mg/hr (12/07/16 1900)  . dexmedetomidine (PRECEDEX) IV infusion Stopped (12/04/16 2041)  . DOPamine Stopped (12/07/16 1417)  . lactated ringers    . lactated ringers Stopped (12/05/16 0200)  . lactated ringers Stopped (12/07/16 1511)  . nitroGLYCERIN Stopped (12/04/16 1840)  . phenylephrine (NEO-SYNEPHRINE) Adult infusion 10 mcg/min (12/07/16 1900)     Lab Results  Component Value Date   WBC 9.6 12/07/2016   HGB 11.9 (L) 12/07/2016   HCT 38.5 12/07/2016   PLT 196 12/07/2016   GLUCOSE 152 (H) 12/07/2016   CHOL 139 11/28/2016   TRIG 88 11/28/2016   HDL 33 (L) 11/28/2016   LDLCALC 88 11/28/2016   ALT 14 11/27/2016   AST 16 11/27/2016   NA 134 (L) 12/07/2016   K 4.5 12/07/2016   CL 101 12/07/2016   CREATININE 1.31 (H) 12/07/2016   BUN 33 (H) 12/07/2016   CO2 23 12/07/2016   TSH 4.14 06/16/2016   INR 1.34 12/04/2016   HGBA1C 7.0 (H) 12/06/2016  now a flutter, did not  respond to rapid a pacing, but rate better controlled  Still nausea with reglan and zofran No vomiting, abdomen not distended  Too weak  to walk  Dopamine ,   Grace Isaac MD  [place] Office [phone] 12/07/2016 7:31 PM

## 2016-12-07 NOTE — Progress Notes (Signed)
Patient ID: [name], female   DOB: Mar 31, 1946, 71 y.o.   MRN: [id number] TCTS DAILY ICU PROGRESS NOTE                   [address]            [place] [address]          [phone]   3 Days Post-Op Procedure(s) (LRB): CORONARY ARTERY BYPASS GRAFTING (CABG)x 4 WITH ENDOSCOPIC HARVESTING OF RIGHT SAPHENOUS VEIN (N/A) CLIPPING OF ATRIAL APPENDAGE USING ATRICURE 45 ATRICLIP (Left) INTRAOPERATIVE TRANSESOPHAGEAL ECHOCARDIOGRAM (N/A) MAZE PROCEDURE (N/A)  Total Length of Stay:  LOS: 10 days   Subjective: Up to chair and walked 150 feet , nausea today  Objective: Vital signs in last 24 hours: Temp:  [98 F (36.7 C)-99.1 F (37.3 C)] 98 F (36.7 C) (05/06 0405) Pulse Rate:  [55-135] 90 (05/06 0430) Cardiac Rhythm: Atrial fibrillation (05/06 0500) Resp:  [0-35] 18 (05/06 0600) BP: (72-127)/(40-96) 110/68 (05/06 0600) SpO2:  [87 %-100 %] 96 % (05/06 0600) Weight:  [178 lb 11.2 oz (81.1 kg)] 178 lb 11.2 oz (81.1 kg) (05/06 0600)  Filed Weights   12/05/16 0500 12/06/16 0500 12/07/16 0600  Weight: 178 lb 9.2 oz (81 kg) 177 lb 14.6 oz (80.7 kg) 178 lb 11.2 oz (81.1 kg)    Weight change: 12.6 oz (0.358 kg)   Hemodynamic parameters for last 24 hours:    Intake/Output from previous day: 05/05 0701 - 05/06 0700 In: 1549.2 [P.O.:360; I.V.:804.2; Blood:335; IV Piggyback:50] Out: 640 [Urine:640]  Intake/Output this shift: No intake/output data recorded.  Current Meds: Scheduled Meds: . acetaminophen  1,000 mg Oral Q6H   Or  . acetaminophen (TYLENOL) oral liquid 160 mg/5 mL  1,000 mg Per Tube Q6H  . aspirin EC  325 mg Oral Daily   Or  . aspirin  324 mg Per Tube Daily  . atorvastatin  80 mg Oral q1800  . bisacodyl  10 mg Oral Daily   Or  . bisacodyl  10 mg Rectal Daily  . docusate sodium  200 mg Oral Daily  . enoxaparin (LOVENOX) injection  40 mg Subcutaneous QHS  . ferrous sulfate  325 mg Oral BID WC  . folic acid  1 mg Oral Daily  . furosemide   20 mg Intravenous BID  . insulin aspart  0-15 Units Subcutaneous TID WC  . metoprolol tartrate  12.5 mg Oral BID   Or  . metoprolol tartrate  12.5 mg Per Tube BID  . midodrine  2.5 mg Oral TID WC  . pantoprazole  40 mg Oral Daily  . sodium chloride flush  3 mL Intravenous Q12H   Continuous Infusions: . sodium chloride Stopped (12/05/16 0949)  . sodium chloride    . sodium chloride Stopped (12/04/16 2300)  . dexmedetomidine (PRECEDEX) IV infusion Stopped (12/04/16 2041)  . diltiazem (CARDIZEM) infusion 5 mg/hr (12/07/16 0700)  . DOPamine 1.993 mcg/kg/min (12/07/16 0700)  . lactated ringers    . lactated ringers Stopped (12/05/16 0200)  . lactated ringers 20 mL/hr at 12/07/16 0700  . nitroGLYCERIN Stopped (12/04/16 1840)  . phenylephrine (NEO-SYNEPHRINE) Adult infusion 8 mcg/min (12/07/16 0700)   PRN Meds:.sodium chloride, lactated ringers, metoCLOPramide (REGLAN) injection, metoprolol, midazolam, morphine injection, ondansetron (ZOFRAN) IV, oxyCODONE, sodium chloride flush, traMADol  General appearance: alert, cooperative and appears older than stated age Neurologic: intact Heart: irregularly irregular rhythm Lungs: diminished breath sounds bibasilar Abdomen: soft, non-tender; bowel sounds normal; no masses,  no  organomegaly Extremities: extremities normal, atraumatic, no cyanosis or edema and Homans sign is negative, no sign of DVT Wound: sternum intact  Lab Results: CBC: Recent Labs  12/06/16 0401 12/07/16 0337  WBC 13.0* 9.6  HGB 7.4* 11.9*  HCT 24.4* 38.5  PLT 224 196   BMET:  Recent Labs  12/06/16 0401 12/07/16 0337  NA 137 134*  K 4.3 4.5  CL 102 101  CO2 25 23  GLUCOSE 107* 152*  BUN 23* 33*  CREATININE 1.13* 1.31*  CALCIUM 8.3* 8.2*    CMET: Lab Results  Component Value Date   WBC 9.6 12/07/2016   HGB 11.9 (L) 12/07/2016   HCT 38.5 12/07/2016   PLT 196 12/07/2016   GLUCOSE 152 (H) 12/07/2016   CHOL 139 11/28/2016   TRIG 88 11/28/2016   HDL 33  (L) 11/28/2016   LDLCALC 88 11/28/2016   ALT 14 11/27/2016   AST 16 11/27/2016   NA 134 (L) 12/07/2016   K 4.5 12/07/2016   CL 101 12/07/2016   CREATININE 1.31 (H) 12/07/2016   BUN 33 (H) 12/07/2016   CO2 23 12/07/2016   TSH 4.14 06/16/2016   INR 1.34 12/04/2016   HGBA1C 6.6 06/16/2016      PT/INR:  Recent Labs  12/04/16 1707  LABPROT 16.7*  INR 1.34   Radiology: Dg Chest Port 1 View  Result Date: 12/07/2016 CLINICAL DATA:  Chest tube in place. EXAM: PORTABLE CHEST 1 VIEW COMPARISON:  Radiograph of Dec 06, 2016. FINDINGS: Stable cardiomegaly. Status post coronary bypass graft. Left-sided pacemaker is unchanged in position. No pneumothorax is noted. No chest tube is noted. Mild bibasilar opacities are noted concerning for edema or atelectasis with minimal associated pleural effusions. Bony thorax is unremarkable. Right internal jugular venous sheath is unchanged in position. IMPRESSION: Mild bibasilar opacities are noted concerning for edema or atelectasis with minimal associated pleural effusions. Electronically Signed   By: Marijo Conception, M.D.   On: 12/07/2016 07:22   COX 52 ? Result   Assessment/Plan: S/P Procedure(s) (LRB): CORONARY ARTERY BYPASS GRAFTING (CABG)x 4 WITH ENDOSCOPIC HARVESTING OF RIGHT SAPHENOUS VEIN (N/A) CLIPPING OF ATRIAL APPENDAGE USING ATRICURE 45 ATRICLIP (Left) INTRAOPERATIVE TRANSESOPHAGEAL ECHOCARDIOGRAM (N/A) MAZE PROCEDURE (N/A) Mobilize Diuresis On Cardizem drip to control  afib rate , still fast, patient did not want to start amniodrone Place pic line Cardiology to see- poor lv function, rapid afib and some couplets  Keep in unit  Use ultram rather then oxy to see if decreases nausea   Grace Isaac 12/07/2016 7:26 AM

## 2016-12-08 ENCOUNTER — Inpatient Hospital Stay (HOSPITAL_COMMUNITY): Payer: PPO

## 2016-12-08 DIAGNOSIS — K5901 Slow transit constipation: Secondary | ICD-10-CM

## 2016-12-08 DIAGNOSIS — R110 Nausea: Secondary | ICD-10-CM

## 2016-12-08 LAB — CBC
HCT: 25.5 % — ABNORMAL LOW (ref 36.0–46.0)
Hemoglobin: 8.2 g/dL — ABNORMAL LOW (ref 12.0–15.0)
MCH: 28.5 pg (ref 26.0–34.0)
MCHC: 32.2 g/dL (ref 30.0–36.0)
MCV: 88.5 fL (ref 78.0–100.0)
Platelets: 265 10*3/uL (ref 150–400)
RBC: 2.88 MIL/uL — ABNORMAL LOW (ref 3.87–5.11)
RDW: 17.1 % — ABNORMAL HIGH (ref 11.5–15.5)
WBC: 10.4 10*3/uL (ref 4.0–10.5)

## 2016-12-08 LAB — COMPREHENSIVE METABOLIC PANEL
ALT: 39 U/L (ref 14–54)
AST: 35 U/L (ref 15–41)
Albumin: 2.6 g/dL — ABNORMAL LOW (ref 3.5–5.0)
Alkaline Phosphatase: 66 U/L (ref 38–126)
Anion gap: 9 (ref 5–15)
BUN: 40 mg/dL — ABNORMAL HIGH (ref 6–20)
CO2: 23 mmol/L (ref 22–32)
Calcium: 8 mg/dL — ABNORMAL LOW (ref 8.9–10.3)
Chloride: 101 mmol/L (ref 101–111)
Creatinine, Ser: 1.3 mg/dL — ABNORMAL HIGH (ref 0.44–1.00)
GFR calc Af Amer: 48 mL/min — ABNORMAL LOW (ref >60)
GFR calc non Af Amer: 41 mL/min — ABNORMAL LOW (ref >60)
Glucose, Bld: 173 mg/dL — ABNORMAL HIGH (ref 65–99)
Potassium: 4.3 mmol/L (ref 3.5–5.1)
Sodium: 133 mmol/L — ABNORMAL LOW (ref 135–145)
Total Bilirubin: 0.6 mg/dL (ref 0.3–1.2)
Total Protein: 5.2 g/dL — ABNORMAL LOW (ref 6.5–8.1)

## 2016-12-08 LAB — GLUCOSE, CAPILLARY
GLUCOSE-CAPILLARY: 107 mg/dL — AB (ref 65–99)
GLUCOSE-CAPILLARY: 179 mg/dL — AB (ref 65–99)
GLUCOSE-CAPILLARY: 193 mg/dL — AB (ref 65–99)
Glucose-Capillary: 190 mg/dL — ABNORMAL HIGH (ref 65–99)

## 2016-12-08 LAB — COOXEMETRY PANEL
Carboxyhemoglobin: 1 % (ref 0.5–1.5)
Methemoglobin: 2.5 % — ABNORMAL HIGH (ref 0.0–1.5)
O2 Saturation: 63.1 %
Total hemoglobin: 7.7 g/dL — ABNORMAL LOW (ref 12.0–16.0)

## 2016-12-08 MED ORDER — FLEET ENEMA 7-19 GM/118ML RE ENEM
1.0000 | ENEMA | Freq: Once | RECTAL | Status: AC
  Administered 2016-12-08: 1 via RECTAL
  Filled 2016-12-08: qty 1

## 2016-12-08 MED ORDER — METOCLOPRAMIDE HCL 5 MG/ML IJ SOLN
10.0000 mg | Freq: Four times a day (QID) | INTRAMUSCULAR | Status: AC
  Administered 2016-12-08 – 2016-12-09 (×4): 10 mg via INTRAVENOUS
  Filled 2016-12-08 (×4): qty 2

## 2016-12-08 MED ORDER — POLYETHYLENE GLYCOL 3350 17 G PO PACK
17.0000 g | PACK | Freq: Every day | ORAL | Status: DC
  Administered 2016-12-08: 17 g via ORAL
  Filled 2016-12-08 (×2): qty 1

## 2016-12-08 MED FILL — Heparin Sodium (Porcine) Inj 1000 Unit/ML: INTRAMUSCULAR | Qty: 40 | Status: AC

## 2016-12-08 MED FILL — Sodium Chloride IV Soln 0.9%: INTRAVENOUS | Qty: 2000 | Status: AC

## 2016-12-08 MED FILL — Sodium Bicarbonate IV Soln 8.4%: INTRAVENOUS | Qty: 50 | Status: AC

## 2016-12-08 MED FILL — Electrolyte-R (PH 7.4) Solution: INTRAVENOUS | Qty: 4000 | Status: AC

## 2016-12-08 MED FILL — Lidocaine HCl IV Inj 20 MG/ML: INTRAVENOUS | Qty: 5 | Status: AC

## 2016-12-08 MED FILL — Mannitol IV Soln 20%: INTRAVENOUS | Qty: 500 | Status: AC

## 2016-12-08 NOTE — Progress Notes (Signed)
TCTS BRIEF SICU PROGRESS NOTE  4 Days Post-Op  S/P Procedure(s) (LRB): CORONARY ARTERY BYPASS GRAFTING (CABG)x 4 WITH ENDOSCOPIC HARVESTING OF RIGHT SAPHENOUS VEIN (N/A) CLIPPING OF ATRIAL APPENDAGE USING ATRICURE 45 ATRICLIP (Left) INTRAOPERATIVE TRANSESOPHAGEAL ECHOCARDIOGRAM (N/A) MAZE PROCEDURE (N/A)   Stable day Rate controlled Afib, now off Neo drip O2 sats 89-95% on 2 L/min UOP adequate  Plan: Continue current plan  Rexene Alberts, MD 12/08/2016 8:38 PM

## 2016-12-08 NOTE — Progress Notes (Signed)
Per RN, RT not to change arterial line set up or PAP.

## 2016-12-08 NOTE — Progress Notes (Signed)
DAILY PROGRESS NOTE   Patient Name: [name] Date of Encounter: 12/08/2016  Hospital Problem List   Principal Problem:   Ventricular fibrillation [facility]) Active Problems:   Atrial fibrillation with rapid ventricular response ([name])   CAD (coronary artery disease), native coronary artery - 3 vessel   NSTEMI (non-ST elevated myocardial infarction) ([place])   Acute on chronic diastolic (congestive) heart failure ([name])   S/P CABG x 5   Pressure injury of skin    Chief Complaint   Nausea - improving  Subjective   Now appears to be in a-fib/flutter/ Rate-controlled off diltiazem and on IV amiodarone. I personally reviewed KUB at bedside as it was taken - appears that there is non-specific gas pattern in the colon. I agree she may benefit from reglan - perhaps miralax. BP improved - trying to wean off neosynephrine.    Objective   Vitals:   12/08/16 0400 12/08/16 0500 12/08/16 0600 12/08/16 0700  BP: (!) 99/56 112/62 (!) 141/119 108/68  Pulse: (!) 46 (!) 40 (!) 59 64  Resp: (!) 9 15 15  (!) 23  Temp:    97.1 F (36.2 C)  TempSrc:    Oral  SpO2: 100% 100% 99% 96%  Weight:      Height:        Intake/Output Summary (Last 24 hours) at 12/08/16 0816 Last data filed at 12/08/16 0600  Gross per 24 hour  Intake          1189.98 ml  Output              320 ml  Net           869.98 ml   Filed Weights   12/06/16 0500 12/07/16 0600 12/08/16 0300  Weight: 177 lb 14.6 oz (80.7 kg) 178 lb 11.2 oz (81.1 kg) 182 lb 5.1 oz (82.7 kg)    Physical Exam   General appearance: alert, mild distress and feels nauseated Neck: no carotid bruit and no JVD Lungs: diminished breath sounds bilaterally Heart: irregularly irregular rhythm Abdomen: protuberant, mild TTP, hypoactive BS Extremities: extremities normal, atraumatic, no cyanosis or edema Pulses: 2+ and symmetric Skin: Skin color, texture, turgor normal. No rashes or lesions Neurologic: Grossly normal Psych: More comfortable  today  Inpatient Medications    Scheduled Meds: . acetaminophen  1,000 mg Oral Q6H   Or  . acetaminophen (TYLENOL) oral liquid 160 mg/5 mL  1,000 mg Per Tube Q6H  . aspirin EC  325 mg Oral Daily   Or  . aspirin  324 mg Per Tube Daily  . atorvastatin  80 mg Oral q1800  . bisacodyl  10 mg Oral Daily   Or  . bisacodyl  10 mg Rectal Daily  . Chlorhexidine Gluconate Cloth  6 each Topical Daily  . docusate sodium  200 mg Oral Daily  . enoxaparin (LOVENOX) injection  40 mg Subcutaneous QHS  . ferrous sulfate  325 mg Oral BID WC  . folic acid  1 mg Oral Daily  . furosemide  20 mg Intravenous BID  . insulin aspart  0-15 Units Subcutaneous TID WC  . mouth rinse  15 mL Mouth Rinse BID  . metoCLOPramide (REGLAN) injection  10 mg Intravenous Q6H  . metoprolol tartrate  12.5 mg Oral BID   Or  . metoprolol tartrate  12.5 mg Per Tube BID  . midodrine  2.5 mg Oral TID WC  . pantoprazole  40 mg Oral Daily  . sodium chloride flush  10-40  mL Intracatheter Q12H  . sodium chloride flush  3 mL Intravenous Q12H    Continuous Infusions: . sodium chloride Stopped (12/05/16 0949)  . sodium chloride    . sodium chloride 10 mL/hr at 12/07/16 1900  . amiodarone 30 mg/hr (12/07/16 2000)  . DOPamine Stopped (12/07/16 1417)  . lactated ringers    . lactated ringers Stopped (12/05/16 0200)  . lactated ringers Stopped (12/07/16 1511)  . phenylephrine (NEO-SYNEPHRINE) Adult infusion 10 mcg/min (12/07/16 2000)    PRN Meds: sodium chloride, lactated ringers, metoprolol, midazolam, ondansetron (ZOFRAN) IV, oxyCODONE, sodium chloride flush, sodium chloride flush, traMADol   Labs   Results for orders placed or performed during the hospital encounter of 11/27/16 (from the past 48 hour(s))  Glucose, capillary     Status: None   Collection Time: 12/06/16  8:55 AM  Result Value Ref Range   Glucose-Capillary 98 65 - 99 mg/dL  Glucose, capillary     Status: Abnormal   Collection Time: 12/06/16 12:04 PM    Result Value Ref Range   Glucose-Capillary 144 (H) 65 - 99 mg/dL   Comment 1 Capillary Specimen    Comment 2 Notify RN    Comment 3 Document in Chart   Glucose, capillary     Status: Abnormal   Collection Time: 12/06/16  3:57 PM  Result Value Ref Range   Glucose-Capillary 145 (H) 65 - 99 mg/dL   Comment 1 Capillary Specimen    Comment 2 Notify RN    Comment 3 Document in Chart   Glucose, capillary     Status: Abnormal   Collection Time: 12/06/16  7:48 PM  Result Value Ref Range   Glucose-Capillary 146 (H) 65 - 99 mg/dL   Comment 1 Capillary Specimen    Comment 2 Notify RN   Prepare RBC     Status: None   Collection Time: 12/06/16  8:00 PM  Result Value Ref Range   Order Confirmation BB SAMPLE OR UNITS ALREADY AVAILABLE   Cooxemetry Panel (carboxy, met, total hgb, O2 sat)     Status: Abnormal   Collection Time: 12/07/16  3:35 AM  Result Value Ref Range   Total hemoglobin 5.3 (LL) 12.0 - 16.0 g/dL    Comment: CRITICAL RESULT CALLED TO, READ BACK BY AND VERIFIED WITH: Dyane Dustman RN AT [phone] BY Berneice Gandy RRT, RCP ON 5/ CRITICAL RESULT CALLED TO, READ BACK BY AND VERIFIED WITH:  Dyane Dustman RN AT [phone] BY Berneice Gandy RRT, RCP ON 12/07/2016    O2 Saturation 77.0 %   Carboxyhemoglobin 1.9 (H) 0.5 - 1.5 %   Methemoglobin 1.0 0.0 - 1.5 %  Basic metabolic panel     Status: Abnormal   Collection Time: 12/07/16  3:37 AM  Result Value Ref Range   Sodium 134 (L) 135 - 145 mmol/L   Potassium 4.5 3.5 - 5.1 mmol/L   Chloride 101 101 - 111 mmol/L   CO2 23 22 - 32 mmol/L   Glucose, Bld 152 (H) 65 - 99 mg/dL   BUN 33 (H) 6 - 20 mg/dL   Creatinine, Ser 1.31 (H) 0.44 - 1.00 mg/dL   Calcium 8.2 (L) 8.9 - 10.3 mg/dL   GFR calc non Af Amer 41 (L) >60 mL/min   GFR calc Af Amer 47 (L) >60 mL/min    Comment: (NOTE) The eGFR has been calculated using the CKD EPI equation. This calculation has not been validated in all clinical situations. eGFR's persistently <60 mL/min signify possible  Chronic  Kidney Disease.    Anion gap 10 5 - 15  CBC     Status: Abnormal   Collection Time: 12/07/16  3:37 AM  Result Value Ref Range   WBC 9.6 4.0 - 10.5 K/uL   RBC 4.30 3.87 - 5.11 MIL/uL   Hemoglobin 11.9 (L) 12.0 - 15.0 g/dL    Comment: REPEATED TO VERIFY POST TRANSFUSION SPECIMEN    HCT 38.5 36.0 - 46.0 %   MCV 89.5 78.0 - 100.0 fL   MCH 27.7 26.0 - 34.0 pg   MCHC 30.9 30.0 - 36.0 g/dL   RDW 17.1 (H) 11.5 - 15.5 %   Platelets 196 150 - 400 K/uL  .Cooxemetry Panel (carboxy, met, total hgb, O2 sat)     Status: Abnormal   Collection Time: 12/07/16  5:17 AM  Result Value Ref Range   Total hemoglobin 8.9 (L) 12.0 - 16.0 g/dL   O2 Saturation 52.0 %   Carboxyhemoglobin 1.5 0.5 - 1.5 %   Methemoglobin 1.4 0.0 - 1.5 %  Glucose, capillary     Status: Abnormal   Collection Time: 12/07/16  8:46 AM  Result Value Ref Range   Glucose-Capillary 141 (H) 65 - 99 mg/dL   Comment 1 Notify RN   Glucose, capillary     Status: Abnormal   Collection Time: 12/07/16 12:10 PM  Result Value Ref Range   Glucose-Capillary 174 (H) 65 - 99 mg/dL   Comment 1 Notify RN   Glucose, capillary     Status: Abnormal   Collection Time: 12/07/16  5:06 PM  Result Value Ref Range   Glucose-Capillary 148 (H) 65 - 99 mg/dL   Comment 1 Notify RN   Glucose, capillary     Status: Abnormal   Collection Time: 12/07/16  9:42 PM  Result Value Ref Range   Glucose-Capillary 122 (H) 65 - 99 mg/dL   Comment 1 Capillary Specimen   Comprehensive metabolic panel     Status: Abnormal   Collection Time: 12/08/16  6:18 AM  Result Value Ref Range   Sodium 133 (L) 135 - 145 mmol/L   Potassium 4.3 3.5 - 5.1 mmol/L   Chloride 101 101 - 111 mmol/L   CO2 23 22 - 32 mmol/L   Glucose, Bld 173 (H) 65 - 99 mg/dL   BUN 40 (H) 6 - 20 mg/dL   Creatinine, Ser 1.30 (H) 0.44 - 1.00 mg/dL   Calcium 8.0 (L) 8.9 - 10.3 mg/dL   Total Protein 5.2 (L) 6.5 - 8.1 g/dL   Albumin 2.6 (L) 3.5 - 5.0 g/dL   AST 35 15 - 41 U/L   ALT 39 14 - 54  U/L   Alkaline Phosphatase 66 38 - 126 U/L   Total Bilirubin 0.6 0.3 - 1.2 mg/dL   GFR calc non Af Amer 41 (L) >60 mL/min   GFR calc Af Amer 48 (L) >60 mL/min    Comment: (NOTE) The eGFR has been calculated using the CKD EPI equation. This calculation has not been validated in all clinical situations. eGFR's persistently <60 mL/min signify possible Chronic Kidney Disease.    Anion gap 9 5 - 15  CBC     Status: Abnormal   Collection Time: 12/08/16  6:18 AM  Result Value Ref Range   WBC 10.4 4.0 - 10.5 K/uL   RBC 2.88 (L) 3.87 - 5.11 MIL/uL   Hemoglobin 8.2 (L) 12.0 - 15.0 g/dL    Comment: REPEATED TO VERIFY DELTA CHECK NOTED  HCT 25.5 (L) 36.0 - 46.0 %   MCV 88.5 78.0 - 100.0 fL   MCH 28.5 26.0 - 34.0 pg   MCHC 32.2 30.0 - 36.0 g/dL   RDW 17.1 (H) 11.5 - 15.5 %   Platelets 265 150 - 400 K/uL  Cooxemetry Panel (carboxy, met, total hgb, O2 sat)     Status: Abnormal   Collection Time: 12/08/16  6:20 AM  Result Value Ref Range   Total hemoglobin 7.7 (L) 12.0 - 16.0 g/dL   O2 Saturation 63.1 %   Carboxyhemoglobin 1.0 0.5 - 1.5 %   Methemoglobin 2.5 (H) 0.0 - 1.5 %    ECG   None today  Telemetry   A-fib with RVR - Personally Reviewed  Radiology    Dg Chest Port 1 View  Result Date: 12/08/2016 CLINICAL DATA:  Recent cardiac surgery. EXAM: PORTABLE CHEST 1 VIEW COMPARISON:  12/07/2016 FINDINGS: Sequelae of recent CABG and left atrial appendage clipping are again identified. Right jugular sheath has been removed. A right PICC remains in place and terminates over the lower SVC. The cardiac silhouette remains mildly enlarged. Dual lead pacemaker remains in place. Pulmonary vascular congestion has decreased. Patchy left basilar opacity has not significantly changed, and a small left pleural effusion is again noted. There is mild focal opacity in the right lung base which has increased, and a trace right pleural effusion is not excluded. No pneumothorax is seen. IMPRESSION: 1.  Decreased pulmonary vascular congestion. 2. Left greater than right basilar atelectasis or infiltrate with small pleural effusions. Electronically Signed   By: Logan Bores M.D.   On: 12/08/2016 07:48   Dg Chest Port 1 View  Addendum Date: 12/07/2016   ADDENDUM REPORT: 12/07/2016 14:42 ADDENDUM: Right-sided PICC line is noted with distal tip in expected position of cavoatrial junction. Electronically Signed   By: Marijo Conception, M.D.   On: 12/07/2016 14:42   Result Date: 12/07/2016 CLINICAL DATA:  PICC line placement. EXAM: PORTABLE CHEST 1 VIEW COMPARISON:  Radiograph of Dec 07, 2016. FINDINGS: Stable cardiomegaly. Status post coronary artery bypass graft. Left-sided pacemaker is unchanged in position. Right internal jugular venous sheath is unchanged in position. Mild central pulmonary vascular congestion and perihilar edema is noted. No pneumothorax is noted. Mild left basilar atelectasis or infiltrate is noted with associated pleural effusion. Bony thorax is unremarkable. IMPRESSION: Mild central pulmonary vascular congestion is noted with possible mild bilateral perihilar edema. Mild left basilar atelectasis or infiltrate is noted with associated pleural effusion. Electronically Signed: By: Marijo Conception, M.D. On: 12/07/2016 14:13   Dg Chest Port 1 View  Result Date: 12/07/2016 CLINICAL DATA:  Chest tube in place. EXAM: PORTABLE CHEST 1 VIEW COMPARISON:  Radiograph of Dec 06, 2016. FINDINGS: Stable cardiomegaly. Status post coronary bypass graft. Left-sided pacemaker is unchanged in position. No pneumothorax is noted. No chest tube is noted. Mild bibasilar opacities are noted concerning for edema or atelectasis with minimal associated pleural effusions. Bony thorax is unremarkable. Right internal jugular venous sheath is unchanged in position. IMPRESSION: Mild bibasilar opacities are noted concerning for edema or atelectasis with minimal associated pleural effusions. Electronically Signed   By: Marijo Conception, M.D.   On: 12/07/2016 07:22    Cardiac Studies   Conclusion    Severe three-vessel native coronary disease with chronic total occlusion of the RCA supplied by left-to-right collaterals. Inferior wall branches are graftable.  Total occlusion of the mid circumflex with retrograde filling of 2 large obtuse  marginal branches. Branches appear graftable.  Severe LAD and diagonal disease with subtotal occlusion in the mid vessel of the LAD. 90% stenosis in the proximal portion of large diagonal.  Elevated left ventricular end-diastolic pressure during tachycardia.  Sustained, hemodynamically stable ventricular tachycardia during the procedure that required pace termination via the AICD.  RECOMMENDATIONS:   Assessment of LV function by CT and/or echo with specific attention to the presence absence of an LV aneurysm involving the inferior wall.  TCTS notified of need for CABG and ? Aneurysm resection if present.    Assessment   Principal Problem:   Ventricular fibrillation ([name]) Active Problems:   Atrial fibrillation with rapid ventricular response ([name])   CAD (coronary artery disease), native coronary artery - 3 vessel   NSTEMI (non-ST elevated myocardial infarction) ([name])   Acute on chronic diastolic (congestive) heart failure ([name])   S/P CABG x 5   Pressure injury of skin   Plan   1. A-fib / flutter with rate control on IV amiodarone. Off dopamine. BP improved, likely can wean off neosynephrine today. KUB suggests bowel/stool pattern - likely contributing to nausea. No BM in 1 week, but has not eaten much. Prokinetic ordered - will give Miralax as well. Continue IV Amiodarone until she is reliably tolerating diet and moving bowels - then would consider transition to oral amiodarone or up-titrating b-blocker.  Time Spent Directly with Patient:  15 minutes  Length of Stay:  LOS: 11 days   Pixie Casino, MD, [place]  Attending  Cardiologist  Direct Dial: [phone]  Fax: [phone]  Website:  [email or web address]  Nadean Corwin Hawley Michel 12/08/2016, 8:16 AM

## 2016-12-08 NOTE — Progress Notes (Addendum)
4 Days Post-Op Procedure(s) (LRB): CORONARY ARTERY BYPASS GRAFTING (CABG)x 4 WITH ENDOSCOPIC HARVESTING OF RIGHT SAPHENOUS VEIN (N/A) CLIPPING OF ATRIAL APPENDAGE USING ATRICURE 45 ATRICLIP (Left) INTRAOPERATIVE TRANSESOPHAGEAL ECHOCARDIOGRAM (N/A) MAZE PROCEDURE (N/A) Subjective: Nausea better but not resolved  Objective: Vital signs in last 24 hours: Temp:  [97.5 F (36.4 C)-98.5 F (36.9 C)] 97.6 F (36.4 C) (05/07 0300) Pulse Rate:  [35-111] 64 (05/07 0700) Cardiac Rhythm: Atrial fibrillation;Atrial flutter (05/07 0400) Resp:  [9-24] 23 (05/07 0700) BP: (74-161)/(34-134) 108/68 (05/07 0700) SpO2:  [91 %-100 %] 96 % (05/07 0700) Weight:  [182 lb 5.1 oz (82.7 kg)] 182 lb 5.1 oz (82.7 kg) (05/07 0300)  Hemodynamic parameters for last 24 hours:    Intake/Output from previous day: 05/06 0701 - 05/07 0700 In: 1226.9 [P.O.:210; I.V.:1016.9] Out: 345 [Urine:345] Intake/Output this shift: No intake/output data recorded.  General appearance: alert, cooperative and no distress Neurologic: intact Heart: irregularly irregular rhythm Lungs: diminished breath sounds bibasilar Abdomen: normal findings: soft, non-tender Wound: clean and dry  Lab Results:  Recent Labs  12/07/16 0337 12/08/16 0618  WBC 9.6 10.4  HGB 11.9* 8.2*  HCT 38.5 25.5*  PLT 196 265   BMET:  Recent Labs  12/07/16 0337 12/08/16 0618  NA 134* 133*  K 4.5 4.3  CL 101 101  CO2 23 23  GLUCOSE 152* 173*  BUN 33* 40*  CREATININE 1.31* 1.30*  CALCIUM 8.2* 8.0*    PT/INR: No results for input(s): LABPROT, INR in the last 72 hours. ABG    Component Value Date/Time   PHART 7.369 12/04/2016 2343   HCO3 24.0 12/04/2016 2343   TCO2 25 12/05/2016 1612   ACIDBASEDEF 1.0 12/04/2016 2343   O2SAT 63.1 12/08/2016 0620   CBG (last 3)   Recent Labs  12/07/16 1210 12/07/16 1706 12/07/16 2142  GLUCAP 174* 148* 122*    Assessment/Plan: S/P Procedure(s) (LRB): CORONARY ARTERY BYPASS GRAFTING (CABG)x  4 WITH ENDOSCOPIC HARVESTING OF RIGHT SAPHENOUS VEIN (N/A) CLIPPING OF ATRIAL APPENDAGE USING ATRICURE 45 ATRICLIP (Left) INTRAOPERATIVE TRANSESOPHAGEAL ECHOCARDIOGRAM (N/A) MAZE PROCEDURE (N/A) - CV- in atrial fib/ flutter with rate in 90s on amiodarone  Still on neo drip- wean off if tolerated  Restart Xarelto prior to dc  Has LAA clip in place  Co-ox is 62  RESP- LLL atelectasis- IS + flutter  RENAL- creatinine mildly elevated, stable  ENDO- CBG reasonable control  GI- nausea persists, but improving  Will check a KUB although I doubt obstruction  Diabetic so could be gastroparesis  Will give reglan Q6 x 4 doses  Ambulated this AM- continue  Anemia- Hgb stable from 48 hours ago. Suspect yesterday's Hgb was a lab error   LOS: 11 days    [name] 12/08/2016

## 2016-12-09 ENCOUNTER — Inpatient Hospital Stay (HOSPITAL_COMMUNITY): Payer: PPO

## 2016-12-09 LAB — COMPREHENSIVE METABOLIC PANEL
ALBUMIN: 2.8 g/dL — AB (ref 3.5–5.0)
ALK PHOS: 98 U/L (ref 38–126)
ALT: 58 U/L — ABNORMAL HIGH (ref 14–54)
AST: 53 U/L — AB (ref 15–41)
Anion gap: 8 (ref 5–15)
BILIRUBIN TOTAL: 0.9 mg/dL (ref 0.3–1.2)
BUN: 47 mg/dL — AB (ref 6–20)
CALCIUM: 8.1 mg/dL — AB (ref 8.9–10.3)
CO2: 24 mmol/L (ref 22–32)
Chloride: 101 mmol/L (ref 101–111)
Creatinine, Ser: 1.66 mg/dL — ABNORMAL HIGH (ref 0.44–1.00)
GFR calc Af Amer: 36 mL/min — ABNORMAL LOW (ref >60)
GFR, EST NON AFRICAN AMERICAN: 31 mL/min — AB (ref >60)
GLUCOSE: 186 mg/dL — AB (ref 65–99)
Potassium: 4.3 mmol/L (ref 3.5–5.1)
Sodium: 133 mmol/L — ABNORMAL LOW (ref 135–145)
TOTAL PROTEIN: 5.5 g/dL — AB (ref 6.5–8.1)

## 2016-12-09 LAB — GLUCOSE, CAPILLARY
GLUCOSE-CAPILLARY: 157 mg/dL — AB (ref 65–99)
GLUCOSE-CAPILLARY: 85 mg/dL (ref 65–99)
GLUCOSE-CAPILLARY: 143 mg/dL — AB (ref 65–99)
Glucose-Capillary: 146 mg/dL — ABNORMAL HIGH (ref 65–99)

## 2016-12-09 LAB — CBC
HEMATOCRIT: 27.3 % — AB (ref 36.0–46.0)
HEMOGLOBIN: 8.4 g/dL — AB (ref 12.0–15.0)
MCH: 27 pg (ref 26.0–34.0)
MCHC: 30.8 g/dL (ref 30.0–36.0)
MCV: 87.8 fL (ref 78.0–100.0)
Platelets: 306 10*3/uL (ref 150–400)
RBC: 3.11 MIL/uL — ABNORMAL LOW (ref 3.87–5.11)
RDW: 16.7 % — ABNORMAL HIGH (ref 11.5–15.5)
WBC: 9.7 10*3/uL (ref 4.0–10.5)

## 2016-12-09 MED ORDER — METOCLOPRAMIDE HCL 5 MG/ML IJ SOLN
10.0000 mg | Freq: Four times a day (QID) | INTRAMUSCULAR | Status: AC
  Administered 2016-12-09 (×4): 10 mg via INTRAVENOUS
  Filled 2016-12-09 (×4): qty 2

## 2016-12-09 MED ORDER — ALPRAZOLAM 0.25 MG PO TABS
0.2500 mg | ORAL_TABLET | Freq: Three times a day (TID) | ORAL | Status: DC | PRN
  Administered 2016-12-09 – 2016-12-11 (×2): 0.25 mg via ORAL
  Filled 2016-12-09 (×3): qty 1

## 2016-12-09 MED ORDER — GLIPIZIDE 5 MG PO TABS
5.0000 mg | ORAL_TABLET | Freq: Every day | ORAL | Status: DC
  Administered 2016-12-09 – 2017-01-08 (×26): 5 mg via ORAL
  Filled 2016-12-09 (×30): qty 1

## 2016-12-09 MED ORDER — ATORVASTATIN CALCIUM 40 MG PO TABS
40.0000 mg | ORAL_TABLET | Freq: Every day | ORAL | Status: DC
  Administered 2016-12-09 – 2017-01-07 (×26): 40 mg via ORAL
  Filled 2016-12-09 (×28): qty 1

## 2016-12-09 MED ORDER — SCOPOLAMINE 1 MG/3DAYS TD PT72
1.0000 | MEDICATED_PATCH | TRANSDERMAL | Status: AC
  Administered 2016-12-09: 1.5 mg via TRANSDERMAL
  Filled 2016-12-09: qty 1

## 2016-12-09 MED ORDER — ACETAMINOPHEN 500 MG PO TABS
1000.0000 mg | ORAL_TABLET | Freq: Four times a day (QID) | ORAL | Status: DC | PRN
  Administered 2016-12-20 – 2016-12-31 (×7): 1000 mg via ORAL
  Filled 2016-12-09 (×7): qty 2

## 2016-12-09 MED ORDER — ENSURE ENLIVE PO LIQD
237.0000 mL | Freq: Two times a day (BID) | ORAL | Status: DC
  Administered 2016-12-09 – 2017-01-06 (×31): 237 mL via ORAL

## 2016-12-09 MED ORDER — ASPIRIN EC 81 MG PO TBEC
81.0000 mg | DELAYED_RELEASE_TABLET | Freq: Every day | ORAL | Status: DC
  Administered 2016-12-09 – 2016-12-18 (×9): 81 mg via ORAL
  Filled 2016-12-09 (×11): qty 1

## 2016-12-09 NOTE — Progress Notes (Signed)
CT Surgery  NSR 68/min off iv amio Walked in hall Resting comfortably

## 2016-12-09 NOTE — Progress Notes (Addendum)
DAILY PROGRESS NOTE   Patient Name: [name] Date of Encounter: 12/09/2016  Hospital Problem List   Principal Problem:   Ventricular fibrillation [facility]) Active Problems:   Atrial fibrillation with rapid ventricular response ([name])   CAD (coronary artery disease), native coronary artery - 3 vessel   NSTEMI (non-ST elevated myocardial infarction) ([place])   Acute on chronic diastolic (congestive) heart failure ([name])   S/P CABG x 5   Pressure injury of skin    Chief Complaint   Nausea is worse today.  Subjective   Rhythm is AV paced at 60 today- some intermittent a-fib overnight. No NSVT. Had BM yesterday - KUB not remarkable. Started on reglan.   Objective   Vitals:   12/09/16 0500 12/09/16 0600 12/09/16 0700 12/09/16 0800  BP: 92/70 121/80 125/80 123/75  Pulse: 65 62 60 (!) 59  Resp: (!) 27 (!) 21 16 15   Temp:      TempSrc:      SpO2: 100% 100% 100% 100%  Weight:  180 lb 6.4 oz (81.8 kg)    Height:        Intake/Output Summary (Last 24 hours) at 12/09/16 0851 Last data filed at 12/09/16 0700  Gross per 24 hour  Intake            777.5 ml  Output              665 ml  Net            112.5 ml   Filed Weights   12/07/16 0600 12/08/16 0300 12/09/16 0600  Weight: 178 lb 11.2 oz (81.1 kg) 182 lb 5.1 oz (82.7 kg) 180 lb 6.4 oz (81.8 kg)    Physical Exam   General appearance: alert and mild distress Neck: no carotid bruit and no JVD Lungs: rales bibasilar Heart: regular rate and rhythm Abdomen: soft, mild TTP diffusely Extremities: extremities normal, atraumatic, no cyanosis or edema Pulses: 2+ and symmetric Skin: Pale, dry Neurologic: Grossly normal Psych: Nauseated today  Inpatient Medications    Scheduled Meds: . aspirin EC  81 mg Oral Daily  . atorvastatin  40 mg Oral q1800  . bisacodyl  10 mg Oral Daily   Or  . bisacodyl  10 mg Rectal Daily  . Chlorhexidine Gluconate Cloth  6 each Topical Daily  . docusate sodium  200 mg Oral Daily  .  enoxaparin (LOVENOX) injection  40 mg Subcutaneous QHS  . folic acid  1 mg Oral Daily  . glipiZIDE  5 mg Oral Q breakfast  . insulin aspart  0-15 Units Subcutaneous TID WC  . mouth rinse  15 mL Mouth Rinse BID  . metoCLOPramide (REGLAN) injection  10 mg Intravenous Q6H  . metoprolol tartrate  12.5 mg Oral BID   Or  . metoprolol tartrate  12.5 mg Per Tube BID  . midodrine  2.5 mg Oral TID WC  . pantoprazole  40 mg Oral Daily  . polyethylene glycol  17 g Oral Daily  . scopolamine  1 patch Transdermal Q72H  . sodium chloride flush  10-40 mL Intracatheter Q12H  . sodium chloride flush  3 mL Intravenous Q12H    Continuous Infusions: . sodium chloride Stopped (12/05/16 0949)  . sodium chloride    . sodium chloride 10 mL/hr at 12/09/16 0700  . lactated ringers    . lactated ringers Stopped (12/05/16 0200)  . lactated ringers Stopped (12/07/16 1511)  . phenylephrine (NEO-SYNEPHRINE) Adult infusion 0 mcg/min (12/08/16 1000)  PRN Meds: sodium chloride, acetaminophen, lactated ringers, metoprolol, midazolam, ondansetron (ZOFRAN) IV, oxyCODONE, sodium chloride flush, sodium chloride flush, traMADol   Labs   Results for orders placed or performed during the hospital encounter of 11/27/16 (from the past 48 hour(s))  Glucose, capillary     Status: Abnormal   Collection Time: 12/07/16 12:10 PM  Result Value Ref Range   Glucose-Capillary 174 (H) 65 - 99 mg/dL   Comment 1 Notify RN   Glucose, capillary     Status: Abnormal   Collection Time: 12/07/16  5:06 PM  Result Value Ref Range   Glucose-Capillary 148 (H) 65 - 99 mg/dL   Comment 1 Notify RN   Glucose, capillary     Status: Abnormal   Collection Time: 12/07/16  9:42 PM  Result Value Ref Range   Glucose-Capillary 122 (H) 65 - 99 mg/dL   Comment 1 Capillary Specimen   Comprehensive metabolic panel     Status: Abnormal   Collection Time: 12/08/16  6:18 AM  Result Value Ref Range   Sodium 133 (L) 135 - 145 mmol/L   Potassium 4.3  3.5 - 5.1 mmol/L   Chloride 101 101 - 111 mmol/L   CO2 23 22 - 32 mmol/L   Glucose, Bld 173 (H) 65 - 99 mg/dL   BUN 40 (H) 6 - 20 mg/dL   Creatinine, Ser 1.30 (H) 0.44 - 1.00 mg/dL   Calcium 8.0 (L) 8.9 - 10.3 mg/dL   Total Protein 5.2 (L) 6.5 - 8.1 g/dL   Albumin 2.6 (L) 3.5 - 5.0 g/dL   AST 35 15 - 41 U/L   ALT 39 14 - 54 U/L   Alkaline Phosphatase 66 38 - 126 U/L   Total Bilirubin 0.6 0.3 - 1.2 mg/dL   GFR calc non Af Amer 41 (L) >60 mL/min   GFR calc Af Amer 48 (L) >60 mL/min    Comment: (NOTE) The eGFR has been calculated using the CKD EPI equation. This calculation has not been validated in all clinical situations. eGFR's persistently <60 mL/min signify possible Chronic Kidney Disease.    Anion gap 9 5 - 15  CBC     Status: Abnormal   Collection Time: 12/08/16  6:18 AM  Result Value Ref Range   WBC 10.4 4.0 - 10.5 K/uL   RBC 2.88 (L) 3.87 - 5.11 MIL/uL   Hemoglobin 8.2 (L) 12.0 - 15.0 g/dL    Comment: REPEATED TO VERIFY DELTA CHECK NOTED    HCT 25.5 (L) 36.0 - 46.0 %   MCV 88.5 78.0 - 100.0 fL   MCH 28.5 26.0 - 34.0 pg   MCHC 32.2 30.0 - 36.0 g/dL   RDW 17.1 (H) 11.5 - 15.5 %   Platelets 265 150 - 400 K/uL  Cooxemetry Panel (carboxy, met, total hgb, O2 sat)     Status: Abnormal   Collection Time: 12/08/16  6:20 AM  Result Value Ref Range   Total hemoglobin 7.7 (L) 12.0 - 16.0 g/dL   O2 Saturation 63.1 %   Carboxyhemoglobin 1.0 0.5 - 1.5 %   Methemoglobin 2.5 (H) 0.0 - 1.5 %  Glucose, capillary     Status: Abnormal   Collection Time: 12/08/16  8:02 AM  Result Value Ref Range   Glucose-Capillary 107 (H) 65 - 99 mg/dL   Comment 1 Notify RN   Glucose, capillary     Status: Abnormal   Collection Time: 12/08/16 12:32 PM  Result Value Ref Range   Glucose-Capillary 179 (H)  65 - 99 mg/dL   Comment 1 Notify RN   Glucose, capillary     Status: Abnormal   Collection Time: 12/08/16  3:36 PM  Result Value Ref Range   Glucose-Capillary 190 (H) 65 - 99 mg/dL   Comment  1 Notify RN   Glucose, capillary     Status: Abnormal   Collection Time: 12/08/16  9:52 PM  Result Value Ref Range   Glucose-Capillary 193 (H) 65 - 99 mg/dL  CBC     Status: Abnormal   Collection Time: 12/09/16  4:21 AM  Result Value Ref Range   WBC 9.7 4.0 - 10.5 K/uL   RBC 3.11 (L) 3.87 - 5.11 MIL/uL   Hemoglobin 8.4 (L) 12.0 - 15.0 g/dL   HCT 27.3 (L) 36.0 - 46.0 %   MCV 87.8 78.0 - 100.0 fL   MCH 27.0 26.0 - 34.0 pg   MCHC 30.8 30.0 - 36.0 g/dL   RDW 16.7 (H) 11.5 - 15.5 %   Platelets 306 150 - 400 K/uL  Comprehensive metabolic panel     Status: Abnormal   Collection Time: 12/09/16  4:21 AM  Result Value Ref Range   Sodium 133 (L) 135 - 145 mmol/L   Potassium 4.3 3.5 - 5.1 mmol/L   Chloride 101 101 - 111 mmol/L   CO2 24 22 - 32 mmol/L   Glucose, Bld 186 (H) 65 - 99 mg/dL   BUN 47 (H) 6 - 20 mg/dL   Creatinine, Ser 1.66 (H) 0.44 - 1.00 mg/dL   Calcium 8.1 (L) 8.9 - 10.3 mg/dL   Total Protein 5.5 (L) 6.5 - 8.1 g/dL   Albumin 2.8 (L) 3.5 - 5.0 g/dL   AST 53 (H) 15 - 41 U/L   ALT 58 (H) 14 - 54 U/L   Alkaline Phosphatase 98 38 - 126 U/L   Total Bilirubin 0.9 0.3 - 1.2 mg/dL   GFR calc non Af Amer 31 (L) >60 mL/min   GFR calc Af Amer 36 (L) >60 mL/min    Comment: (NOTE) The eGFR has been calculated using the CKD EPI equation. This calculation has not been validated in all clinical situations. eGFR's persistently <60 mL/min signify possible Chronic Kidney Disease.    Anion gap 8 5 - 15  Glucose, capillary     Status: Abnormal   Collection Time: 12/09/16  8:04 AM  Result Value Ref Range   Glucose-Capillary 146 (H) 65 - 99 mg/dL   Comment 1 Capillary Specimen     ECG   None today  Telemetry   AV paced at 60 - Personally Reviewed  Radiology    Dg Chest Port 1 View  Result Date: 12/09/2016 CLINICAL DATA:  Atelectasis. EXAM: PORTABLE CHEST 1 VIEW COMPARISON:  12/08/2016 FINDINGS: Increased densities throughout both lungs suggestive for increased pulmonary edema.  Cardiac silhouette remains enlarged with postsurgical changes. Right arm PICC line tip near the superior cavoatrial junction. There is a left cardiac ICD. Negative for a pneumothorax. Blunting at the costophrenic angles probably represents pleural effusions. IMPRESSION: Increased densities in both lungs suggestive for increased pulmonary edema. Evidence for small pleural effusions. Electronically Signed   By: Markus Daft M.D.   On: 12/09/2016 07:28   Dg Chest Port 1 View  Result Date: 12/08/2016 CLINICAL DATA:  Recent cardiac surgery. EXAM: PORTABLE CHEST 1 VIEW COMPARISON:  12/07/2016 FINDINGS: Sequelae of recent CABG and left atrial appendage clipping are again identified. Right jugular sheath has been removed. A right PICC remains  in place and terminates over the lower SVC. The cardiac silhouette remains mildly enlarged. Dual lead pacemaker remains in place. Pulmonary vascular congestion has decreased. Patchy left basilar opacity has not significantly changed, and a small left pleural effusion is again noted. There is mild focal opacity in the right lung base which has increased, and a trace right pleural effusion is not excluded. No pneumothorax is seen. IMPRESSION: 1. Decreased pulmonary vascular congestion. 2. Left greater than right basilar atelectasis or infiltrate with small pleural effusions. Electronically Signed   By: Logan Bores M.D.   On: 12/08/2016 07:48   Dg Chest Port 1 View  Addendum Date: 12/07/2016   ADDENDUM REPORT: 12/07/2016 14:42 ADDENDUM: Right-sided PICC line is noted with distal tip in expected position of cavoatrial junction. Electronically Signed   By: Marijo Conception, M.D.   On: 12/07/2016 14:42   Result Date: 12/07/2016 CLINICAL DATA:  PICC line placement. EXAM: PORTABLE CHEST 1 VIEW COMPARISON:  Radiograph of Dec 07, 2016. FINDINGS: Stable cardiomegaly. Status post coronary artery bypass graft. Left-sided pacemaker is unchanged in position. Right internal jugular venous sheath  is unchanged in position. Mild central pulmonary vascular congestion and perihilar edema is noted. No pneumothorax is noted. Mild left basilar atelectasis or infiltrate is noted with associated pleural effusion. Bony thorax is unremarkable. IMPRESSION: Mild central pulmonary vascular congestion is noted with possible mild bilateral perihilar edema. Mild left basilar atelectasis or infiltrate is noted with associated pleural effusion. Electronically Signed: By: Marijo Conception, M.D. On: 12/07/2016 14:13   Dg Abd Portable 1v  Result Date: 12/08/2016 CLINICAL DATA:  Nausea and constipation for the past week EXAM: PORTABLE ABDOMEN - 1 VIEW COMPARISON:  No recent studies in PACs. There is a CT scan of the abdomen and pelvis dated May 11, 2019 is 101 which was reviewed. FINDINGS: The colonic stool and gas pattern is within the limits of normal. No free extraluminal gas collections are observed. There are no abnormal soft tissue calcifications. The observed bony structures exhibit no acute intra-abdominal abnormality. IMPRESSION: The colonic stool burden is not excessive. No acute intra-abdominal abnormality is observed. Electronically Signed   By: David  Martinique M.D.   On: 12/08/2016 08:19    Cardiac Studies   Conclusion    Severe three-vessel native coronary disease with chronic total occlusion of the RCA supplied by left-to-right collaterals. Inferior wall branches are graftable.  Total occlusion of the mid circumflex with retrograde filling of 2 large obtuse marginal branches. Branches appear graftable.  Severe LAD and diagonal disease with subtotal occlusion in the mid vessel of the LAD. 90% stenosis in the proximal portion of large diagonal.  Elevated left ventricular end-diastolic pressure during tachycardia.  Sustained, hemodynamically stable ventricular tachycardia during the procedure that required pace termination via the AICD.  RECOMMENDATIONS:   Assessment of LV function by CT and/or  echo with specific attention to the presence absence of an LV aneurysm involving the inferior wall.  TCTS notified of need for CABG and ? Aneurysm resection if present.    Assessment   Principal Problem:   Ventricular fibrillation ([name]) Active Problems:   Atrial fibrillation with rapid ventricular response ([name])   CAD (coronary artery disease), native coronary artery - 3 vessel   NSTEMI (non-ST elevated myocardial infarction) ([name])   Acute on chronic diastolic (congestive) heart failure ([name])   S/P CABG x 5   Pressure injury of skin   Plan   1. AV paced rhythm today - very nauseated. +BM  yesterday. Written for scopolamine patch. Agree with holding amiodarone. She is 4L negative - some bibasilar infiltrates noted, not sure this is pulmonary edema - ?related to amiodarone. Hold diuretics in the face of rising creatinine.  Time Spent Directly with Patient:  25 minutes  Length of Stay:  LOS: 12 days   Pixie Casino, MD, [place]  Attending Cardiologist  Direct Dial: [phone]  Fax: [phone]  Website:  [email or web address] Audris Speaker 12/09/2016, 8:51 AM

## 2016-12-09 NOTE — Progress Notes (Signed)
5 Days Post-Op Procedure(s) (LRB): CORONARY ARTERY BYPASS GRAFTING (CABG)x 4 WITH ENDOSCOPIC HARVESTING OF RIGHT SAPHENOUS VEIN (N/A) CLIPPING OF ATRIAL APPENDAGE USING ATRICURE 45 ATRICLIP (Left) INTRAOPERATIVE TRANSESOPHAGEAL ECHOCARDIOGRAM (N/A) MAZE PROCEDURE (N/A) Subjective: "I feel terrible" Continues to have nausea  Objective: Vital signs in last 24 hours: Temp:  [96.4 F (35.8 C)-97.9 F (36.6 C)] 97.9 F (36.6 C) (05/07 2311) Pulse Rate:  [48-139] 60 (05/08 0700) Cardiac Rhythm: Atrial fibrillation;Atrial flutter (05/07 2000) Resp:  [12-27] 16 (05/08 0700) BP: (92-144)/(57-99) 125/80 (05/08 0700) SpO2:  [82 %-100 %] 100 % (05/08 0700) Weight:  [180 lb 6.4 oz (81.8 kg)] 180 lb 6.4 oz (81.8 kg) (05/08 0600)  Hemodynamic parameters for last 24 hours:    Intake/Output from previous day: 05/07 0701 - 05/08 0700 In: 777.5 [I.V.:777.5] Out: 665 [Urine:665] Intake/Output this shift: No intake/output data recorded.  General appearance: alert and mild distress Neurologic: intact Heart: regular rate and rhythm Lungs: diminished breath sounds bibasilar Abdomen: normal findings: soft, non-tender Wound: clean and dry  Lab Results:  Recent Labs  12/08/16 0618 12/09/16 0421  WBC 10.4 9.7  HGB 8.2* 8.4*  HCT 25.5* 27.3*  PLT 265 306   BMET:  Recent Labs  12/08/16 0618 12/09/16 0421  NA 133* 133*  K 4.3 4.3  CL 101 101  CO2 23 24  GLUCOSE 173* 186*  BUN 40* 47*  CREATININE 1.30* 1.66*  CALCIUM 8.0* 8.1*    PT/INR: No results for input(s): LABPROT, INR in the last 72 hours. ABG    Component Value Date/Time   PHART 7.369 12/04/2016 2343   HCO3 24.0 12/04/2016 2343   TCO2 25 12/05/2016 1612   ACIDBASEDEF 1.0 12/04/2016 2343   O2SAT 63.1 12/08/2016 0620   CBG (last 3)   Recent Labs  12/08/16 1536 12/08/16 2152 12/09/16 0804  GLUCAP 190* 193* 146*    Assessment/Plan: S/P Procedure(s) (LRB): CORONARY ARTERY BYPASS GRAFTING (CABG)x 4 WITH  ENDOSCOPIC HARVESTING OF RIGHT SAPHENOUS VEIN (N/A) CLIPPING OF ATRIAL APPENDAGE USING ATRICURE 45 ATRICLIP (Left) INTRAOPERATIVE TRANSESOPHAGEAL ECHOCARDIOGRAM (N/A) MAZE PROCEDURE (N/A) -CV- no longer in atrial flutter, paced at 60  Will dc amiodarone as that has caused nausea in the past  Will increase beta blocker if atrial fib/ flutter recurs   -RESP- CXR shows increased interstitial edema although not an issue clinically- will hold off on increasing diuretic for now with creatinine rising  -RENAL- creatinine up to 1.6 potassium OK  Will hold lasix today  -GI- had small BM after enema yesterday- will continue reglan another 24 hours  Will try a scopolamine patch as she complains movement increases nausea  DC amiodarone and iron  -ENDO- CBG elevated, will restart glipizide  -Anemia- stable  -Deconditioning- continue to mobilize as tolerated  LOS: 12 days    [name] 12/09/2016

## 2016-12-09 NOTE — Progress Notes (Signed)
Initial Nutrition Assessment  DOCUMENTATION CODES:   Obesity unspecified  INTERVENTION:   Encouraged protein intake at each meal.   Ensure Enlive po BID, each supplement provides 350 kcal and 20 grams of protein  NUTRITION DIAGNOSIS:   Inadequate oral intake related to altered GI function as evidenced by per patient/family report, meal completion < 50%.  GOAL:   Patient will meet greater than or equal to 90% of their needs  MONITOR:   PO intake, Supplement acceptance, Skin  REASON FOR ASSESSMENT:    (Poor PO)    ASSESSMENT:    Pt with hx of CAD, CHF, PAF, DM, pulmonary HTN admitted 4/26 with chest pain, s/p CABG x 5 on 5/4. Pt with nausea, finally had BM 5/7 after enema.     Admission weight 173 lb (78.7 kg); pt is negative 4 L  Per pt she has not been eating meat x 1 1/2 years. Her grandson is a Microbiologist and he lives with her and she does the cooking.  Breakfast: yogurt with cereal or egg and grits Lunch: salad no protein Dinner: pasta with tomato sauce or beans She will eat a spoonful of peanut butter if she feels weak  Per pt she does not like the food, it has no taste. She has no appetite and has been dealing with nausea. Feels that she has had her foot wound for a few weeks.   Medications reviewed and include: dulcolax, colace, folvite, reglan, miralax CBG"s: [phone] Lab Results  Component Value Date   HGBA1C 7.0 (H) 12/06/2016    Diet Order:  Diet heart healthy/carb modified Room service appropriate? 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  Estimated Nutritional Needs:   Kcal:  1600-1800  Protein:  95-115 grams  Fluid:  >1.7 L/day  EDUCATION  NEEDS:   Education needs addressed  Maylon Peppers RD, [place], [place] Pager [phone] After Hours Pager

## 2016-12-09 NOTE — Care Management Note (Signed)
Case Management Note Previous CM note initiated by Bethena Roys, RN 12/02/2016, 2:50 PM   Patient Details  Name: [name] MRN: [id number] Date of Birth: 23-Apr-1946  Subjective/Objective: Pt presented for chest pain - post cardiac cath 11-28-16 revealled 3 vessel CAD- Plan for CABG on Thursday. Pt is from home with daughter and grandson.             Action/Plan: CM will continue to monitor for disposition needs post d/c.   Expected Discharge Date:                  Expected Discharge Plan:  [place]  In-House Referral:     Discharge planning Services  CM Consult  Post Acute Care Choice:    Choice offered to:     DME Arranged:    DME Agency:     HH Arranged:    [place] Agency:     Status of Service:  In process, will continue to follow  If discussed at Long Length of Stay Meetings, dates discussed:    Additional Comments:  12/09/16- 1125-  [name], CM-  Pt s/p CABGx4 on 5/3-  Remains on 2H post op  Dawayne Patricia, RN 12/09/2016, 11:25 AM [phone]

## 2016-12-10 ENCOUNTER — Inpatient Hospital Stay (HOSPITAL_COMMUNITY): Payer: PPO

## 2016-12-10 LAB — GLUCOSE, CAPILLARY
GLUCOSE-CAPILLARY: 182 mg/dL — AB (ref 65–99)
Glucose-Capillary: 88 mg/dL (ref 65–99)
Glucose-Capillary: 181 mg/dL — ABNORMAL HIGH (ref 65–99)
Glucose-Capillary: 171 mg/dL — ABNORMAL HIGH (ref 65–99)

## 2016-12-10 LAB — BASIC METABOLIC PANEL
Anion gap: 9 (ref 5–15)
BUN: 52 mg/dL — ABNORMAL HIGH (ref 6–20)
CALCIUM: 8.1 mg/dL — AB (ref 8.9–10.3)
CO2: 23 mmol/L (ref 22–32)
Chloride: 102 mmol/L (ref 101–111)
Creatinine, Ser: 1.83 mg/dL — ABNORMAL HIGH (ref 0.44–1.00)
GFR calc non Af Amer: 27 mL/min — ABNORMAL LOW (ref >60)
GFR, EST AFRICAN AMERICAN: 32 mL/min — AB (ref >60)
GLUCOSE: 86 mg/dL (ref 65–99)
Potassium: 4.1 mmol/L (ref 3.5–5.1)
Sodium: 134 mmol/L — ABNORMAL LOW (ref 135–145)

## 2016-12-10 LAB — CBC
HEMATOCRIT: 26.3 % — AB (ref 36.0–46.0)
Hemoglobin: 8.1 g/dL — ABNORMAL LOW (ref 12.0–15.0)
MCH: 27.2 pg (ref 26.0–34.0)
MCHC: 30.8 g/dL (ref 30.0–36.0)
MCV: 88.3 fL (ref 78.0–100.0)
Platelets: 316 10*3/uL (ref 150–400)
RBC: 2.98 MIL/uL — ABNORMAL LOW (ref 3.87–5.11)
RDW: 16.7 % — AB (ref 11.5–15.5)
WBC: 9.3 10*3/uL (ref 4.0–10.5)

## 2016-12-10 MED ORDER — SODIUM CHLORIDE 0.9% FLUSH
3.0000 mL | Freq: Two times a day (BID) | INTRAVENOUS | Status: DC
  Administered 2016-12-14 – 2016-12-16 (×3): 3 mL via INTRAVENOUS

## 2016-12-10 MED ORDER — SODIUM CHLORIDE 0.9% FLUSH: 3.0000 mL | INTRAVENOUS | Status: DC | PRN

## 2016-12-10 MED ORDER — METOPROLOL TARTRATE 25 MG PO TABS
25.0000 mg | ORAL_TABLET | Freq: Two times a day (BID) | ORAL | Status: DC
  Administered 2016-12-11: 25 mg via ORAL
  Filled 2016-12-10 (×5): qty 1

## 2016-12-10 MED ORDER — MOVING RIGHT ALONG BOOK
Freq: Once | Status: AC
  Administered 2016-12-10: 11:00:00
  Filled 2016-12-10 (×2): qty 1

## 2016-12-10 MED ORDER — MAGNESIUM HYDROXIDE 400 MG/5ML PO SUSP
30.0000 mL | Freq: Every day | ORAL | Status: DC | PRN
Start: 2016-12-10 — End: 2017-01-08
  Administered 2016-12-26 – 2017-01-01 (×3): 30 mL via ORAL
  Filled 2016-12-10 (×3): qty 30

## 2016-12-10 MED ORDER — SODIUM CHLORIDE 0.9 % IV SOLN: 250.0000 mL | INTRAVENOUS | Status: DC | PRN

## 2016-12-10 MED ORDER — POLYETHYLENE GLYCOL 3350 17 G PO PACK
17.0000 g | PACK | Freq: Every day | ORAL | Status: DC | PRN
Start: 2016-12-10 — End: 2016-12-16

## 2016-12-10 MED ORDER — ALUM & MAG HYDROXIDE-SIMETH 200-200-20 MG/5ML PO SUSP
15.0000 mL | ORAL | Status: DC | PRN
Start: 2016-12-10 — End: 2017-01-08
  Administered 2016-12-25 – 2017-01-06 (×3): 15 mL via ORAL
  Filled 2016-12-10 (×3): qty 30

## 2016-12-10 NOTE — Progress Notes (Signed)
6 Days Post-Op Procedure(s) (LRB): CORONARY ARTERY BYPASS GRAFTING (CABG)x 4 WITH ENDOSCOPIC HARVESTING OF RIGHT SAPHENOUS VEIN (N/A) CLIPPING OF ATRIAL APPENDAGE USING ATRICURE 45 ATRICLIP (Left) INTRAOPERATIVE TRANSESOPHAGEAL ECHOCARDIOGRAM (N/A) MAZE PROCEDURE (N/A) Subjective: Feels much better today. Able to tolerate ensure Was able to void this AM  Objective: Vital signs in last 24 hours: Temp:  [96.6 F (35.9 C)-97.7 F (36.5 C)] 96.6 F (35.9 C) (05/09 0800) Pulse Rate:  [41-72] 68 (05/09 0800) Cardiac Rhythm: Normal sinus rhythm (05/09 0800) Resp:  [11-22] 19 (05/09 0800) BP: (108-161)/(53-114) 134/73 (05/09 0800) SpO2:  [91 %-100 %] 91 % (05/09 0800) Weight:  [182 lb 15.7 oz (83 kg)] 182 lb 15.7 oz (83 kg) (05/09 0500)  Hemodynamic parameters for last 24 hours:    Intake/Output from previous day: 05/08 0701 - 05/09 0700 In: 500.6 [P.O.:240; I.V.:260.6] Out: 35 [Urine:35] Intake/Output this shift: Total I/O In: 260 [P.O.:240; I.V.:20] Out: 200 [Urine:200]  General appearance: alert, cooperative and no distress Neurologic: intact Heart: regular rate and rhythm Lungs: diminished breath sounds bibasilar Abdomen: soft, nontender, hypoactive BS Extremities: edema trace Wound: clean and dry  Lab Results:  Recent Labs  12/09/16 0421 12/10/16 0552  WBC 9.7 9.3  HGB 8.4* 8.1*  HCT 27.3* 26.3*  PLT 306 316   BMET:  Recent Labs  12/09/16 0421 12/10/16 0552  NA 133* 134*  K 4.3 4.1  CL 101 102  CO2 24 23  GLUCOSE 186* 86  BUN 47* 52*  CREATININE 1.66* 1.83*  CALCIUM 8.1* 8.1*    PT/INR: No results for input(s): LABPROT, INR in the last 72 hours. ABG    Component Value Date/Time   PHART 7.369 12/04/2016 2343   HCO3 24.0 12/04/2016 2343   TCO2 25 12/05/2016 1612   ACIDBASEDEF 1.0 12/04/2016 2343   O2SAT 63.1 12/08/2016 0620   CBG (last 3)   Recent Labs  12/09/16 1714 12/09/16 2124 12/10/16 0801  GLUCAP 85 143* 88     Assessment/Plan: S/P Procedure(s) (LRB): CORONARY ARTERY BYPASS GRAFTING (CABG)x 4 WITH ENDOSCOPIC HARVESTING OF RIGHT SAPHENOUS VEIN (N/A) CLIPPING OF ATRIAL APPENDAGE USING ATRICURE 45 ATRICLIP (Left) INTRAOPERATIVE TRANSESOPHAGEAL ECHOCARDIOGRAM (N/A) MAZE PROCEDURE (N/A) -CV- in SR off amiodarone,   BP better- dc midodrine  Increase lopressor to 25 mg BID  RESP- still has some LLL atelectasis- IS  RENAL- creatinine up a little but starting to level off  AKI- over diuresed, continue to hold lasix today- will need to resume in a day or two  ENDO- CBG better  Anemia secondary to ABL- stable follow  Anxiety- better with xanax  Continue cardiac rehab  Transfer to step down   LOS: 13 days    Melrose Nakayama 12/10/2016

## 2016-12-10 NOTE — Progress Notes (Signed)
Telemetry reveals sinus rhythm with intermittent pacing. Off amiodarone. Slow recovery. Follow-up with [facility] or Lovena Le after discharge. Cardiology will sign-off. Call with questions.  Pixie Casino, MD, [place]  Attending Cardiologist  Direct Dial: [phone]  Fax: [phone]  Website:  [email or web address]

## 2016-12-10 NOTE — Progress Notes (Signed)
CARDIAC REHAB PHASE I   PRE:  Rate/Rhythm: 75 SR  BP:  Sitting: 129/64        SaO2: 97 RA  MODE:  Ambulation: 150 ft   POST:  Rate/Rhythm: 79 SR  BP:  Sitting: 118/58         SaO2: 93 RA  Pt stood with minimal assistance, c/o intermittent nausea, states it is much better today than yesterday. Pt ambulated 150 ft on RA, rolling walker, slow, mostly steady gait, tolerated fairly well, standing rest x4. Pt c/o of R knee/leg pain, states she fell last time she was in the hospital, states no imaging studies were completed, states it has been painful and more difficult to walk on it ever since. Pt may benefit from PT consult. Pt is from home, states her daughter and grandson live with her but are unable to provide 24 hour care at discharge. Pt may benefit from SNF at d/c, states she went to [facility] in the past and would be happy to return there if possible. RN notified. Pt to edge of bed per pt request after walk, call bell within reach. Will follow.  [place], RN, BSN 12/10/2016 2:50 PM

## 2016-12-10 NOTE — Progress Notes (Signed)
Patient arrived to 2W room 24, transferred from [facility].  Telemetry monitor applied and CCMD notified.  Patient oriented to unit and room to include call light and phone.  Patient expresses no discomfort currently.  Will continue to monitor.

## 2016-12-11 ENCOUNTER — Inpatient Hospital Stay (HOSPITAL_COMMUNITY): Payer: PPO

## 2016-12-11 LAB — BASIC METABOLIC PANEL
ANION GAP: 10 (ref 5–15)
BUN: 59 mg/dL — ABNORMAL HIGH (ref 6–20)
CALCIUM: 8.4 mg/dL — AB (ref 8.9–10.3)
CO2: 24 mmol/L (ref 22–32)
Chloride: 100 mmol/L — ABNORMAL LOW (ref 101–111)
Creatinine, Ser: 1.81 mg/dL — ABNORMAL HIGH (ref 0.44–1.00)
GFR, EST AFRICAN AMERICAN: 32 mL/min — AB (ref >60)
GFR, EST NON AFRICAN AMERICAN: 28 mL/min — AB (ref >60)
GLUCOSE: 126 mg/dL — AB (ref 65–99)
Potassium: 4.2 mmol/L (ref 3.5–5.1)
Sodium: 134 mmol/L — ABNORMAL LOW (ref 135–145)

## 2016-12-11 LAB — GLUCOSE, CAPILLARY
Glucose-Capillary: 119 mg/dL — ABNORMAL HIGH (ref 65–99)
Glucose-Capillary: 182 mg/dL — ABNORMAL HIGH (ref 65–99)
Glucose-Capillary: 120 mg/dL — ABNORMAL HIGH (ref 65–99)
Glucose-Capillary: 234 mg/dL — ABNORMAL HIGH (ref 65–99)

## 2016-12-11 LAB — CBC
HCT: 26.6 % — ABNORMAL LOW (ref 36.0–46.0)
Hemoglobin: 8.1 g/dL — ABNORMAL LOW (ref 12.0–15.0)
MCH: 26.9 pg (ref 26.0–34.0)
MCHC: 30.5 g/dL (ref 30.0–36.0)
MCV: 88.4 fL (ref 78.0–100.0)
PLATELETS: 371 10*3/uL (ref 150–400)
RBC: 3.01 MIL/uL — ABNORMAL LOW (ref 3.87–5.11)
RDW: 16.8 % — AB (ref 11.5–15.5)
WBC: 11.5 10*3/uL — AB (ref 4.0–10.5)

## 2016-12-11 MED ORDER — FUROSEMIDE 40 MG PO TABS
40.0000 mg | ORAL_TABLET | Freq: Every day | ORAL | Status: DC
  Administered 2016-12-12: 40 mg via ORAL
  Filled 2016-12-11: qty 1

## 2016-12-11 MED ORDER — SODIUM CHLORIDE 0.9% FLUSH
10.0000 mL | INTRAVENOUS | Status: DC | PRN
  Administered 2016-12-12 – 2016-12-13 (×2): 20 mL
  Administered 2016-12-16 – 2016-12-17 (×2): 10 mL
  Administered 2016-12-20: 3 mL
  Administered 2016-12-31 – 2017-01-07 (×9): 10 mL
  Filled 2016-12-11 (×14): qty 40

## 2016-12-11 NOTE — Consult Note (Signed)
   [facility] Inpatient Consult   12/11/2016  NAJWA SPILLANE 1946-01-26 [id number]    Spoke with Ms. Fuerst at bedside to discuss [place] Management as it appears she was followed by [facility] in 2017. Discussed potential [facility] Care Management needs. She states "I cannot go home. I need to go rehab". Ms. Polich did not feel up to discussing [facility] services at this due. [facility] Care Management brochure and contact information provided.   Chart reviewed and noted PT has not been ordered. Discussed bedside conversation with Ms. Hessie Dibble nurse. PT/OT orders were placed.    Will make inpatient RNCM aware of above and that [place] Management services were declined at this time.   [name], MSN-Ed, RN,BSN [facility] Liaison [phone]

## 2016-12-11 NOTE — Progress Notes (Signed)
CARDIAC REHAB PHASE I   Second attempt to ambulate with pt today, pt declines, states she is tired and anxious after "a long day" and "needs to rest." Encouraged ambulation later with staff. Pt verbalized understanding. Will follow-up tomorrow.   Lenna Sciara, RN, BSN 12/11/2016 2:38 PM

## 2016-12-11 NOTE — Anesthesia Postprocedure Evaluation (Addendum)
Anesthesia Post Note  Patient: [name]  Procedure(s) Performed: Procedure(s) (LRB): CORONARY ARTERY BYPASS GRAFTING (CABG)x 4 WITH ENDOSCOPIC HARVESTING OF RIGHT SAPHENOUS VEIN (N/A) CLIPPING OF ATRIAL APPENDAGE USING ATRICURE 74 ATRICLIP (Left) INTRAOPERATIVE TRANSESOPHAGEAL ECHOCARDIOGRAM (N/A) MAZE PROCEDURE (N/A)  Patient location during evaluation: Nursing Unit Anesthesia Type: General Level of consciousness: awake and alert, patient cooperative and oriented Pain management: pain level controlled Vital Signs Assessment: post-procedure vital signs reviewed and stable Respiratory status: spontaneous breathing, nonlabored ventilation, respiratory function stable and patient connected to nasal cannula oxygen Cardiovascular status: blood pressure returned to baseline and stable Postop Assessment: adequate PO intake and no signs of nausea or vomiting Anesthetic complications: no       Last Vitals:  Vitals:   12/11/16 0500 12/11/16 0800  BP: (!) 144/69 (!) 149/65  Pulse: 81 85  Resp: 18 20  Temp: 36.4 C 36.4 C    Last Pain:  Vitals:   12/11/16 0956  TempSrc:   PainSc: 1                  JACKSON,E. CARSWELL

## 2016-12-11 NOTE — Progress Notes (Signed)
Patient pressed call light button at 0825 and stated she had slipped to the floor.  Patient had weak right knee before admission to hospital. Patient stated she got up from bed to go to the bathroom and her "bad knee gave out".  Patient was sitting on floor when staff arrived.  Patient repeatedly stated she was not injured or hurting in any way.  Patient was helped to bedside commode and then placed in chair with chair alarm in place and thoroughly educated again to press call light or call nurse/NT before getting up in the future.  Will continue to monitor.

## 2016-12-11 NOTE — Hospital Discharge Follow-Up (Signed)
The patient's appointment at the [facility] has been rescheduled to 12/19/16 @ 0945 and the AVS has been updated with this information.

## 2016-12-11 NOTE — Progress Notes (Addendum)
      [address]       [place] [address]             [phone]        7 Days Post-Op Procedure(s) (LRB): CORONARY ARTERY BYPASS GRAFTING (CABG)x 4 WITH ENDOSCOPIC HARVESTING OF RIGHT SAPHENOUS VEIN (N/A) CLIPPING OF ATRIAL APPENDAGE USING ATRICURE 45 ATRICLIP (Left) INTRAOPERATIVE TRANSESOPHAGEAL ECHOCARDIOGRAM (N/A) MAZE PROCEDURE (N/A)  Subjective: Patient states has not had bowel movement in about 3 days. She tends to be constipated while in the hospital. She does not want an enema at this time,  Objective: Vital signs in last 24 hours: Temp:  [96.6 F (35.9 C)-98.6 F (37 C)] 97.5 F (36.4 C) (05/10 0500) Pulse Rate:  [68-84] 81 (05/10 0500) Cardiac Rhythm: Normal sinus rhythm;Heart block;Bundle branch block (05/10 0700) Resp:  [18-26] 18 (05/10 0500) BP: (93-144)/(69-86) 144/69 (05/10 0500) SpO2:  [90 %-99 %] 95 % (05/10 0500) Weight:  [83.2 kg (183 lb 8 oz)] 83.2 kg (183 lb 8 oz) (05/10 0500)  Pre op weight 77.6 kg Current Weight  12/11/16 83.2 kg (183 lb 8 oz)       Intake/Output from previous day: 05/09 0701 - 05/10 0700 In: 260 [P.O.:240; I.V.:20] Out: 200 [Urine:200]   Physical Exam:  Cardiovascular: RRR Pulmonary: Clear to auscultation on right and diminished left base Abdomen: Soft, non tender, bowel sounds present. Extremities: Bilateral lower extremity edema. Wounds: Clean and dry.  No erythema or signs of infection.  Lab Results: CBC: Recent Labs  12/10/16 0552 12/11/16 0243  WBC 9.3 11.5*  HGB 8.1* 8.1*  HCT 26.3* 26.6*  PLT 316 371   BMET:  Recent Labs  12/10/16 0552 12/11/16 0243  NA 134* 134*  K 4.1 4.2  CL 102 100*  CO2 23 24  GLUCOSE 86 126*  BUN 52* 59*  CREATININE 1.83* 1.81*  CALCIUM 8.1* 8.4*    PT/INR:  Lab Results  Component Value Date   INR 1.34 12/04/2016   INR 2.59 (H) 09/17/2015   INR 1.28 05/12/2015   ABG:  INR: Will add last result for INR, ABG once components are  confirmed Will add last 4 CBG results once components are confirmed  Assessment/Plan:  1. CV - Previous a flutter. Remains in SR, first degree heart block. On Lopressor 25 mg bid. No ACE as creatinine still elevated. Will resume Xarelto at discharge. 2.  Pulmonary - On room air. CXR this am appears to show LLL atelectasis, no pneumothorax, and pulmonary vascular congestion. Encourage incentive spirometer and flutter valve. 3. Volume Overload - Will resume Lasix in am as creatinine appears to have plateaued. 4.  Acute blood loss anemia - H and H stable at 8.1 and 26.6 5. AKI-creatinine slightly decreased to 1.81 6. DM-CBGs 182/171/119. On Glipizide 5 mg daily as taken pre op. Pre op HGA1C 7.0 7. Constipation-patient will request enema or laxative if she is not able to have bowel movement today.    ZIMMERMAN,DONIELLE MPA-C 12/11/2016,7:51 AM Patient seen and examined, agree with above Requesting change to carb modified diet Need to increase ambulation  Anvita Hirata C. Roxan Hockey, MD [facility] [phone]

## 2016-12-11 NOTE — Progress Notes (Signed)
Pacing wires removed without difficulty.  Will continue to monitor.

## 2016-12-11 NOTE — Care Management Important Message (Signed)
Important Message  Patient Details  Name: [name] MRN: [id number] Date of Birth: Feb 06, 1946   Medicare Important Message Given:  Yes    [name] Abena 12/11/2016, 11:00 AM

## 2016-12-12 LAB — BASIC METABOLIC PANEL
Anion gap: 11 (ref 5–15)
BUN: 62 mg/dL — AB (ref 6–20)
CALCIUM: 8.4 mg/dL — AB (ref 8.9–10.3)
CHLORIDE: 97 mmol/L — AB (ref 101–111)
CO2: 25 mmol/L (ref 22–32)
CREATININE: 1.65 mg/dL — AB (ref 0.44–1.00)
GFR calc Af Amer: 36 mL/min — ABNORMAL LOW (ref >60)
GFR calc non Af Amer: 31 mL/min — ABNORMAL LOW (ref >60)
Glucose, Bld: 107 mg/dL — ABNORMAL HIGH (ref 65–99)
Potassium: 4.4 mmol/L (ref 3.5–5.1)
SODIUM: 133 mmol/L — AB (ref 135–145)

## 2016-12-12 LAB — GLUCOSE, CAPILLARY
GLUCOSE-CAPILLARY: 99 mg/dL (ref 65–99)
GLUCOSE-CAPILLARY: 133 mg/dL — AB (ref 65–99)
Glucose-Capillary: 135 mg/dL — ABNORMAL HIGH (ref 65–99)
Glucose-Capillary: 121 mg/dL — ABNORMAL HIGH (ref 65–99)

## 2016-12-12 MED ORDER — RIVAROXABAN 15 MG PO TABS
15.0000 mg | ORAL_TABLET | Freq: Every day | ORAL | Status: DC
  Administered 2016-12-12 – 2016-12-13 (×2): 15 mg via ORAL
  Filled 2016-12-12 (×2): qty 1

## 2016-12-12 MED ORDER — LACTULOSE 10 GM/15ML PO SOLN
20.0000 g | Freq: Once | ORAL | Status: AC
  Administered 2016-12-12: 20 g via ORAL
  Filled 2016-12-12: qty 30

## 2016-12-12 MED ORDER — METOPROLOL TARTRATE 5 MG/5ML IV SOLN: 2.5000 mg | Freq: Two times a day (BID) | INTRAVENOUS | Status: DC | PRN

## 2016-12-12 MED ORDER — METOPROLOL TARTRATE 12.5 MG HALF TABLET
12.5000 mg | ORAL_TABLET | Freq: Two times a day (BID) | ORAL | Status: DC
  Administered 2016-12-12 – 2016-12-14 (×3): 12.5 mg via ORAL
  Filled 2016-12-12 (×4): qty 1

## 2016-12-12 NOTE — Progress Notes (Signed)
Patient refused Lopressor earlier this am. I spoke with her on the importance of taking it. She says she knows her body and it makes her tired and orthostatic. She states she will talk to Dr. Roxan Hockey about it.

## 2016-12-12 NOTE — Evaluation (Signed)
Occupational Therapy Evaluation Patient Details Name: [name] MRN: [id number] DOB: 03-27-1946 Today's Date: 12/12/2016    History of Present Illness 71 yo s/p NSTEMI with CABG 5/3. PMhx: CAD, CHF, VT, orthostatic hypotension, ICD   Clinical Impression   Pt admitted with the above diagnoses and presents with below problem list. Pt will benefit from continued acute OT to address the below listed deficits and maximize independence with basic ADLs prior to d/c to venue below. PTA pt was independent with ADLs. Pt is currently setup to min A with ADLs. Of note, pt with 1 LOB requiring min A to steady while pivoting from toilet after completing toileting tasks. Discussed fall prevention strategies. Reporting feeling SOB, lightheaded while ambulating from bathroom to EOB; improved after seating EOB. Will follow.     Follow Up Recommendations  SNF    Equipment Recommendations  Other (comment) (defer to next venue, likely will need 3n1)    Recommendations for Other Services       Precautions / Restrictions Precautions Precautions: Fall;Sternal Restrictions Weight Bearing Restrictions: Yes      Mobility Bed Mobility Overal bed mobility: Needs Assistance Bed Mobility: Rolling;Sidelying to Sit;Sit to Sidelying Rolling: Supervision Sidelying to sit: Supervision     Sit to sidelying: Supervision General bed mobility comments: sitting EOB at start and end of session  Transfers Overall transfer level: Needs assistance Equipment used: Rolling walker (2 wheeled) Transfers: Sit to/from Stand Sit to Stand: Min guard;Min assist         General transfer comment: Pt following sternal precautions for hand placement to stand. Min A to power up from bed at lowest height. Min guard from elevated bed height.     Balance Overall balance assessment: Needs assistance   Sitting balance-Leahy Scale: Good     Standing balance support: Bilateral upper extremity supported;No upper  extremity supported Standing balance-Leahy Scale: Fair Standing balance comment: LOB after toileting tasks while pivoting out of bathroom, min A needed.                            ADL either performed or assessed with clinical judgement   ADL Overall ADL's : Needs assistance/impaired Eating/Feeding: Set up;Sitting   Grooming: Set up;Sitting;Min guard;Standing   Upper Body Bathing: Sitting;Minimal assistance Upper Body Bathing Details (indicate cue type and reason): due to sternal precautions Lower Body Bathing: Sit to/from stand;Minimal assistance   Upper Body Dressing : Sitting;Minimal assistance   Lower Body Dressing: Sit to/from stand;Minimal assistance   Toilet Transfer: Minimal assistance   Toileting- Clothing Manipulation and Hygiene: Minimal assistance;Sit to/from stand   Tub/ Shower Transfer: Walk-in shower;Min guard;Minimal assistance;Ambulation;Rolling walker Tub/Shower Transfer Details (indicate cue type and reason): clinical judgement Functional mobility during ADLs: Min guard;Minimal assistance;Rolling walker General ADL Comments: Pt ambulated to/from the bathroom to complete toilet transfer and toileting tasks. Instructed to stand in front of seat surfaces to do any task that challenge balance (ex. LB dressing). 1 LOB after pericare and toileting tasks during pivot to left, min A to correct. Pt reporting feeling SOB, lightheaded. Vital signs assessed once EOB and were WNL. Pt reported feeling better at end of session after seating EOB. Discussed strategies for bed mobility and LB ADLs. Discussed elevating seat heights as able to im     Vision         Perception     Praxis      Pertinent Vitals/Pain Pain Assessment: Faces Faces Pain Scale: Hurts  little more Pain Location: incision area Pain Descriptors / Indicators: Guarding;Sore Pain Intervention(s): Limited activity within patient's tolerance;Monitored during session;Repositioned     Hand  Dominance     Extremity/Trunk Assessment Upper Extremity Assessment Upper Extremity Assessment: Overall WFL for tasks assessed   Lower Extremity Assessment Lower Extremity Assessment: Defer to PT evaluation   Cervical / Trunk Assessment Cervical / Trunk Assessment: Normal   Communication Communication Communication: No difficulties   Cognition Arousal/Alertness: Awake/alert Behavior During Therapy: WFL for tasks assessed/performed Overall Cognitive Status: Within Functional Limits for tasks assessed                                     General Comments       Exercises     Shoulder Instructions      Home Living Family/patient expects to be discharged to:: Skilled nursing facility Living Arrangements: Children;Other relatives Available Help at Discharge: Family;Available PRN/intermittently Type of Home: House Home Access: Stairs to enter Entrance Stairs-Number of Steps: 2   Home Layout: Two level;Able to live on main level with bedroom/bathroom     Bathroom Shower/Tub: Teacher, early years/pre: Standard     Home Equipment: Environmental consultant - 2 wheels;Cane - single point   Additional Comments: uses cane in community but no AD in the home      Prior Functioning/Environment Level of Independence: Needs assistance;Independent  Gait / Transfers Assistance Needed: occasional use of AD inside home but mostly no AD for household ambulation. cane/walker for community mobility     Comments: independent with ADLs, prepared her own meals        OT Problem List: Decreased activity tolerance;Impaired balance (sitting and/or standing);Decreased knowledge of use of DME or AE;Decreased knowledge of precautions;Cardiopulmonary status limiting activity;Pain      OT Treatment/Interventions: Self-care/ADL training;Energy conservation;DME and/or AE instruction;Therapeutic activities;Patient/family education;Balance training    OT Goals(Current goals can be found in  the care plan section) Acute Rehab OT Goals Patient Stated Goal: return home after SNF for rehab OT Goal Formulation: With patient Time For Goal Achievement: 04/20/17 ADL Goals Pt Will Perform Grooming: standing;sitting;with supervision;with modified independence Pt Will Perform Upper Body Bathing: with min guard assist;sitting Pt Will Perform Lower Body Bathing: with min guard assist;sit to/from stand Pt Will Perform Upper Body Dressing: with supervision;sitting Pt Will Perform Lower Body Dressing: with min guard assist;sit to/from stand Pt Will Transfer to Toilet: ambulating;with supervision Pt Will Perform Toileting - Clothing Manipulation and hygiene: with supervision;sit to/from stand Additional ADL Goal #1: Pt will independently verbalize 2 energy conservation strategies to incorporate into ADLs.  Additional ADL Goal #2: Pt will complete bed mobility at supervision level to prepare for OOB ADLs.   OT Frequency: Min 2X/week   Barriers to D/C:            Co-evaluation              AM-PAC PT "6 Clicks" Daily Activity     Outcome Measure                 End of Session Equipment Utilized During Treatment: Gait belt;Rolling walker Nurse Communication: Other (comment) (discussed mobility status, 1x LOB with NT)  Activity Tolerance: Other (comment) (SOB, lightheaded after toileting, recovered in sitting) Patient left: in bed;with call bell/phone within reach;with bed alarm set (sitting EOB)  OT Visit Diagnosis: Unsteadiness on feet (R26.81);History of falling (Z91.81)  Time: 7673-4193 OT Time Calculation (min): 30 min Charges:  OT General Charges $OT Visit: 1 Procedure OT Evaluation $OT Eval Low Complexity: 1 Procedure OT Treatments $Self Care/Home Management : 8-22 mins G-Codes:       Hortencia Pilar 12/12/2016, 11:42 AM

## 2016-12-12 NOTE — Progress Notes (Addendum)
      [address]       [place] [address]             [phone]        8 Days Post-Op Procedure(s) (LRB): CORONARY ARTERY BYPASS GRAFTING (CABG)x 4 WITH ENDOSCOPIC HARVESTING OF RIGHT SAPHENOUS VEIN (N/A) CLIPPING OF ATRIAL APPENDAGE USING ATRICURE 45 ATRICLIP (Left) INTRAOPERATIVE TRANSESOPHAGEAL ECHOCARDIOGRAM (N/A) MAZE PROCEDURE (N/A)  Subjective: Patient requesting oral laxative this am. She states she feels better today than yesterday.  Objective: Vital signs in last 24 hours: Temp:  [97.1 F (36.2 C)-99.3 F (37.4 C)] 97.1 F (36.2 C) (05/11 0659) Pulse Rate:  [71-85] 73 (05/11 0659) Cardiac Rhythm: Bundle branch block;Heart block (05/11 0700) Resp:  [18-20] 18 (05/11 0659) BP: (101-149)/(64-72) 131/72 (05/11 0659) SpO2:  [94 %-98 %] 94 % (05/11 0659) Weight:  [83 kg (183 lb)] 83 kg (183 lb) (05/11 0659)  Pre op weight 77.6 kg Current Weight  12/12/16 83 kg (183 lb)       Intake/Output from previous day: 05/10 0701 - 05/11 0700 In: 360 [P.O.:360] Out: -    Physical Exam:  Cardiovascular: RRR Pulmonary: Clear to auscultation on right and diminished left base Abdomen: Soft, non tender, bowel sounds present. Extremities: Bilateral lower extremity edema. Wounds: Clean and dry.  No erythema or signs of infection.  Lab Results: CBC:  Recent Labs  12/10/16 0552 12/11/16 0243  WBC 9.3 11.5*  HGB 8.1* 8.1*  HCT 26.3* 26.6*  PLT 316 371   BMET:   Recent Labs  12/11/16 0243 12/12/16 0347  NA 134* 133*  K 4.2 4.4  CL 100* 97*  CO2 24 25  GLUCOSE 126* 107*  BUN 59* 62*  CREATININE 1.81* 1.65*  CALCIUM 8.4* 8.4*    PT/INR:  Lab Results  Component Value Date   INR 1.34 12/04/2016   INR 2.59 (H) 09/17/2015   INR 1.28 05/12/2015   ABG:  INR: Will add last result for INR, ABG once components are confirmed Will add last 4 CBG results once components are confirmed  Assessment/Plan:  1. CV - Previous a flutter.  Remains in SR, first degree heart block. On Lopressor 25 mg bid. No ACE as creatinine still elevated. Will resume Xarelto at discharge. 2.  Pulmonary - On room air.  Encourage incentive spirometer and flutter valve. 3. Volume Overload - Will resume Lasix this am as creatinine has plateaued and now decreased. 4.  Acute blood loss anemia - H and H stable at 8.1 and 26.6 5. AKI-creatinine decreased from 1.81 to 1.65 6. DM-CBGs 120/234/99. On Glipizide 5 mg daily as taken pre op. Pre op HGA1C 7.0 7. LOC for constipation 8. Will need SNF when ready for discharge-likely Monday  ZIMMERMAN,DONIELLE MPA-C 12/12/2016,7:36 AM Patient seen and examined, agree with above Likely to SNF Monday I will decrease the lopressor dose then hopefully she will take it  [name], MD [facility] [phone]

## 2016-12-12 NOTE — Progress Notes (Signed)
      [address]       [place] [address]             [phone]      Went back into atrial fib with controlled VR this afternoon No surprising since she was refusing lopressor She is currently asymptomatic with HR in 90s and good BP She is agreeing to take lopressor now Will avoid amiodarone and diltiazem due to nausea issues when she was on them She has a left atrial appendage clip in place so embolic risk is lower Will go ahead and resume [place] C. Roxan Hockey, MD [facility] [phone]

## 2016-12-12 NOTE — Evaluation (Signed)
Physical Therapy Evaluation Patient Details Name: [name] MRN: [id number] DOB: [birth year] Today's Date: 12/12/2016   History of Present Illness  71 yo s/p NSTEMI with CABG 5/3. PMhx: CAD, CHF, VT, orthostatic hypotension, ICD  Clinical Impression  Pt EOB on arrival, very pleasant and eager to D/C prior to her birthday. Pt with decreased activity tolerance, transfers and gait who will benefit from acute therapy to maximize mobility, function and independence to decrease burden of care. Pt able to state 3/5 precautions on arrival and 5/5 end of session. Pt educated for all precautions, transfers and gait. Pt without 24hr assist and would benefit from ST-SNF to achieve mod I level.   HR 75-84 BP 119/62 pre, 128/62 post    Follow Up Recommendations SNF;Supervision/Assistance - 24 hour    Equipment Recommendations  None recommended by PT    Recommendations for Other Services       Precautions / Restrictions Precautions Precautions: Fall;Sternal Restrictions Weight Bearing Restrictions: Yes      Mobility  Bed Mobility Overal bed mobility: Needs Assistance Bed Mobility: Rolling;Sidelying to Sit;Sit to Sidelying Rolling: Supervision Sidelying to sit: Supervision     Sit to sidelying: Supervision General bed mobility comments: cues for sequence without physical assist from flat bed  Transfers Overall transfer level: Needs assistance   Transfers: Sit to/from Stand Sit to Stand: Min guard         General transfer comment: cues for hand placement and safety from bed and BSC  Ambulation/Gait Ambulation/Gait assistance: Supervision Ambulation Distance (Feet): 200 Feet Assistive device: Rolling walker (2 wheeled) Gait Pattern/deviations: Step-through pattern;Decreased stride length Gait velocity: 74ft/18 sec=1.11 Gait velocity interpretation: Below normal speed for age/gender General Gait Details: cues for position in RW, limited by fatigue  Stairs             Wheelchair Mobility    Modified Rankin (Stroke Patients Only)       Balance Overall balance assessment: Needs assistance   Sitting balance-Leahy Scale: Good       Standing balance-Leahy Scale: Good                               Pertinent Vitals/Pain Pain Assessment: No/denies pain    Home Living Family/patient expects to be discharged to:: Skilled nursing facility Living Arrangements: Children;Other relatives Available Help at Discharge: Family;Available PRN/intermittently Type of Home: House Home Access: Stairs to enter   Entrance Stairs-Number of Steps: 2 Home Layout: Two level;Able to live on main level with bedroom/bathroom Home Equipment: Gilford Rile - 2 wheels;Cane - single point Additional Comments: uses cane in community but no AD in the home    Prior Function Level of Independence: Needs assistance;Independent   Gait / Transfers Assistance Needed: occasional use of AD inside home but mostly no AD for household ambulation. cane/walker for community mobility     Comments: independent with ADLs, prepared her own meals     Hand Dominance        Extremity/Trunk Assessment   Upper Extremity Assessment Upper Extremity Assessment: Overall WFL for tasks assessed    Lower Extremity Assessment Lower Extremity Assessment: Overall WFL for tasks assessed    Cervical / Trunk Assessment Cervical / Trunk Assessment: Normal  Communication   Communication: No difficulties  Cognition Arousal/Alertness: Awake/alert Behavior During Therapy: WFL for tasks assessed/performed Overall Cognitive Status: Within Functional Limits for tasks assessed  General Comments      Exercises     Assessment/Plan    PT Assessment Patient needs continued PT services  PT Problem List Decreased strength;Decreased mobility;Decreased activity tolerance;Decreased knowledge of use of DME;Decreased knowledge of  precautions       PT Treatment Interventions Gait training;Therapeutic exercise;Patient/family education;Stair training;Functional mobility training;DME instruction;Therapeutic activities    PT Goals (Current goals can be found in the Care Plan section)  Acute Rehab PT Goals Patient Stated Goal: return home PT Goal Formulation: With patient Time For Goal Achievement: 12/26/16 Potential to Achieve Goals: Fair    Frequency Min 3X/week   Barriers to discharge Decreased caregiver support pt reports grandson is gone from 5p-11p and daughter is not reliable    Co-evaluation               AM-PAC PT "6 Clicks" Daily Activity  Outcome Measure Difficulty turning over in bed (including adjusting bedclothes, sheets and blankets)?: None Difficulty moving from lying on back to sitting on the side of the bed? : None Difficulty sitting down on and standing up from a chair with arms (e.g., wheelchair, bedside commode, etc,.)?: None Help needed moving to and from a bed to chair (including a wheelchair)?: A Little Help needed walking in hospital room?: A Little Help needed climbing 3-5 steps with a railing? : A Little 6 Click Score: 21    End of Session Equipment Utilized During Treatment: Gait belt Activity Tolerance: Patient tolerated treatment well Patient left: in bed;with call bell/phone within reach (pt EOB as she denied sitting in chair) Nurse Communication: Mobility status;Precautions PT Visit Diagnosis: Difficulty in walking, not elsewhere classified (R26.2)    Time: 0932-3557 PT Time Calculation (min) (ACUTE ONLY): 25 min   Charges:   PT Evaluation $PT Eval Moderate Complexity: 1 Procedure PT Treatments $Therapeutic Activity: 8-22 mins   PT G Codes:       [name], PT [phone]   [place] 12/12/2016, 10:00 AM

## 2016-12-12 NOTE — Progress Notes (Signed)
CARDIAC REHAB PHASE I   PRE:  Rate/Rhythm: 72 SR    BP: sitting 142/95     SaO2: 95 RA  MODE:  Ambulation: 150 ft   POST:  Rate/Rhythm: 75 SR    BP: sitting 124/65     SaO2: 93 RA  Pt feeling much better, sitting on EOB. Able to walk with RW, steady, several short rest stops. No major c/o but had to hurry back to room due to need to urinate. She could have walked farther. Pt prefers EOB over recliner. Encouraged IS and more walking.  [id number]   [place], ACSM 12/12/2016 11:35 AM

## 2016-12-13 LAB — URINALYSIS, ROUTINE W REFLEX MICROSCOPIC
BILIRUBIN URINE: NEGATIVE
GLUCOSE, UA: NEGATIVE mg/dL
KETONES UR: NEGATIVE mg/dL
Nitrite: NEGATIVE
PH: 5 (ref 5.0–8.0)
Protein, ur: 100 mg/dL — AB
Specific Gravity, Urine: 1.016 (ref 1.005–1.030)

## 2016-12-13 LAB — GLUCOSE, CAPILLARY
GLUCOSE-CAPILLARY: 172 mg/dL — AB (ref 65–99)
Glucose-Capillary: 83 mg/dL (ref 65–99)
Glucose-Capillary: 141 mg/dL — ABNORMAL HIGH (ref 65–99)
Glucose-Capillary: 137 mg/dL — ABNORMAL HIGH (ref 65–99)

## 2016-12-13 LAB — BASIC METABOLIC PANEL
Anion gap: 10 (ref 5–15)
BUN: 56 mg/dL — AB (ref 6–20)
CHLORIDE: 99 mmol/L — AB (ref 101–111)
CO2: 24 mmol/L (ref 22–32)
CREATININE: 1.33 mg/dL — AB (ref 0.44–1.00)
Calcium: 8.4 mg/dL — ABNORMAL LOW (ref 8.9–10.3)
GFR, EST AFRICAN AMERICAN: 46 mL/min — AB (ref >60)
GFR, EST NON AFRICAN AMERICAN: 40 mL/min — AB (ref >60)
Glucose, Bld: 92 mg/dL (ref 65–99)
POTASSIUM: 4.5 mmol/L (ref 3.5–5.1)
SODIUM: 133 mmol/L — AB (ref 135–145)

## 2016-12-13 MED ORDER — ALPRAZOLAM 0.25 MG PO TABS
0.2500 mg | ORAL_TABLET | Freq: Two times a day (BID) | ORAL | Status: DC | PRN
  Administered 2016-12-13 – 2016-12-18 (×3): 0.25 mg via ORAL
  Filled 2016-12-13 (×2): qty 1

## 2016-12-13 MED ORDER — POTASSIUM CHLORIDE CRYS ER 20 MEQ PO TBCR: 20.0000 meq | EXTENDED_RELEASE_TABLET | Freq: Once | ORAL | Status: AC

## 2016-12-13 MED ORDER — MINERAL OIL RE ENEM
1.0000 | ENEMA | Freq: Once | RECTAL | Status: AC
  Administered 2016-12-15: 1 via RECTAL
  Filled 2016-12-13: qty 1

## 2016-12-13 MED ORDER — FUROSEMIDE 40 MG PO TABS
40.0000 mg | ORAL_TABLET | Freq: Two times a day (BID) | ORAL | Status: AC
  Administered 2016-12-13 – 2016-12-14 (×2): 40 mg via ORAL
  Filled 2016-12-13 (×2): qty 1

## 2016-12-13 MED ORDER — ALPRAZOLAM 0.25 MG PO TABS
0.2500 mg | ORAL_TABLET | Freq: Every evening | ORAL | Status: DC | PRN
  Administered 2016-12-13 – 2017-01-07 (×7): 0.25 mg via ORAL
  Filled 2016-12-13 (×10): qty 1

## 2016-12-13 NOTE — Progress Notes (Signed)
CARDIAC REHAB PHASE I   PRE:  Rate/Rhythm: 94 afib  BP:  Supine:   Sitting: 109/58  Standing:    SaO2: 96% ra   MODE:  Ambulation: 112 ft In room  POST:  Rate/Rhythem: 105 afib  BP:  Supine:   Sitting: 101/72  Standing:    SaO2: 96% ra   Pt c/o anxiety and fatigue from insomnia last few nights.  Pt initially declined ambulation, however after given antianxiety medication by RN and getting OOB to commode, pt agreed to ambulate in her room.  Pt walked 112 feet using rolling walker without difficulty. Pt sternal precautions reinforced, IS use reinforced.  Pt verbalized understanding.  Pt returned to chair, call light in reach.    [employer]

## 2016-12-13 NOTE — Discharge Instructions (Addendum)
Atrial Fibrillation Atrial fibrillation is a type of irregular or rapid heartbeat (arrhythmia). In atrial fibrillation, the heart quivers continuously in a chaotic pattern. This occurs when parts of the heart receive disorganized signals that make the heart unable to pump blood normally. This can increase the risk for stroke, heart failure, and other heart-related conditions. There are different types of atrial fibrillation, including: Paroxysmal atrial fibrillation. This type starts suddenly, and it usually stops on its own shortly after it starts. Persistent atrial fibrillation. This type often lasts longer than a week. It may stop on its own or with treatment. Long-lasting persistent atrial fibrillation. This type lasts longer than 12 months. Permanent atrial fibrillation. This type does not go away.  Talk with your health care provider to learn about the type of atrial fibrillation that you have. What are the causes? This condition is caused by some heart-related conditions or procedures, including: A heart attack. Coronary artery disease. Heart failure. Heart valve conditions. High blood pressure. Inflammation of the sac that surrounds the heart (pericarditis). Heart surgery. Certain heart rhythm disorders, such as Wolf-Parkinson-White syndrome.  Other causes include: Pneumonia. Obstructive sleep apnea. Blockage of an artery in the lungs (pulmonary embolism, or PE). Lung cancer. Chronic lung disease. Thyroid problems, especially if the thyroid is overactive (hyperthyroidism). Caffeine. Excessive alcohol use or illegal drug use. Use of some medicines, including certain decongestants and diet pills.  Sometimes, the cause cannot be found. What increases the risk? This condition is more likely to develop in: People who are older in age. People who smoke. People who have diabetes mellitus. People who are overweight (obese). Athletes who exercise vigorously.  What are the signs  or symptoms? Symptoms of this condition include: A feeling that your heart is beating rapidly or irregularly. A feeling of discomfort or pain in your chest. Shortness of breath. Sudden light-headedness or weakness. Getting tired easily during exercise.  In some cases, there are no symptoms. How is this diagnosed? Your health care provider may be able to detect atrial fibrillation when taking your pulse. If detected, this condition may be diagnosed with: An electrocardiogram (ECG). A Holter monitor test that records your heartbeat patterns over a 24-hour period. Transthoracic echocardiogram (TTE) to evaluate how blood flows through your heart. Transesophageal echocardiogram (TEE) to view more detailed images of your heart. A stress test. Imaging tests, such as a CT scan or chest X-ray. Blood tests.  How is this treated? The main goals of treatment are to prevent blood clots from forming and to keep your heart beating at a normal rate and rhythm. The type of treatment that you receive depends on many factors, such as your underlying medical conditions and how you feel when you are experiencing atrial fibrillation. This condition may be treated with: Medicine to slow down the heart rate, bring the hearts rhythm back to normal, or prevent clots from forming. Electrical cardioversion. This is a procedure that resets your hearts rhythm by delivering a controlled, low-energy shock to the heart through your skin. Different types of ablation, such as catheter ablation, catheter ablation with pacemaker, or surgical ablation. These procedures destroy the heart tissues that send abnormal signals. When the pacemaker is used, it is placed under your skin to help your heart beat in a regular rhythm.  Follow these instructions at home: Take over-the counter and prescription medicines only as told by your health care provider. If your health care provider prescribed a blood-thinning medicine  (anticoagulant), take it exactly as told. Taking too  much blood-thinning medicine can cause bleeding. If you do not take enough blood-thinning medicine, you will not have the protection that you need against stroke and other problems. Do not use tobacco products, including cigarettes, chewing tobacco, and e-cigarettes. If you need help quitting, ask your health care provider. If you have obstructive sleep apnea, manage your condition as told by your health care provider. Do not drink alcohol. Do not drink beverages that contain caffeine, such as coffee, soda, and tea. Maintain a healthy weight. Do not use diet pills unless your health care provider approves. Diet pills may make heart problems worse. Follow diet instructions as told by your health care provider. Exercise regularly as told by your health care provider. Keep all follow-up visits as told by your health care provider. This is important. How is this prevented? Avoid drinking beverages that contain caffeine or alcohol. Avoid certain medicines, especially medicines that are used for breathing problems. Avoid certain herbs and herbal medicines, such as those that contain ephedra or ginseng. Do not use illegal drugs, such as cocaine and amphetamines. Do not smoke. Manage your high blood pressure. Contact a health care provider if: You notice a change in the rate, rhythm, or strength of your heartbeat. You are taking an anticoagulant and you notice increased bruising. You tire more easily when you exercise or exert yourself. Get help right away if: You have chest pain, abdominal pain, sweating, or weakness. You feel nauseous. You notice blood in your vomit, bowel movement, or urine. You have shortness of breath. You suddenly have swollen feet and ankles. You feel dizzy. You have sudden weakness or numbness of the face, arm, or leg, especially on one side of the body. You have trouble speaking, trouble understanding, or both  (aphasia). Your face or your eyelid droops on one side. These symptoms may represent a serious problem that is an emergency. Do not wait to see if the symptoms will go away. Get medical help right away. Call your local emergency services (911 in the [place] Do not drive yourself to the hospital. This information is not intended to replace advice given to you by your health care provider. Make sure you discuss any questions you have with your health care provider. Document Released: 07/21/2005 Document Revised: 11/28/2015 Document Reviewed: 11/15/2014 Elsevier Interactive Patient Education  2017 [place]. Milrinone injection What is this medicine? MILRINONE (MILL rih none) is an inotrope and vasodilator. It increases the strength of the heart muscle and widens blood vessels. It is used to treat congestive heart failure. This medicine may be used for other purposes; ask your health care provider or pharmacist if you have questions. COMMON BRAND NAME(S): Primacor What should I tell my health care provider before I take this medicine? They need to know if you have any of these conditions: -heart disease -history of irregular heartbeat -kidney disease -recent heart attack -unusual or allergic reaction to milrinone, other medicines, foods, dyes, or preservatives -pregnant or trying to get pregnant -breast-feeding How should I use this medicine? This medicine is for infusion into a vein. It is given by a health care professional in a hospital or clinic setting. Talk to your pediatrician regarding the use of this medicine in children. While this drug may be prescribed for selected conditions, precautions do apply. Overdosage: If you think you have taken too much of this medicine contact a poison control center or emergency room at once. NOTE: This medicine is only for you. Do not share this medicine with others.  What if I miss a dose? Keep appointments for follow-up doses as directed. It is  important not to miss your dose. Call your doctor or health care professional if you are unable to keep an appointment. What may interact with this medicine? This medication may interact with the following medications: -anagrelide -diuretics -medicines for blood pressure This list may not describe all possible interactions. Give your health care provider a list of all the medicines, herbs, non-prescription drugs, or dietary supplements you use. Also tell them if you smoke, drink alcohol, or use illegal drugs. Some items may interact with your medicine. What should I watch for while using this medicine? Your condition will be monitored carefully while you are receiving this medicine. What side effects may I notice from receiving this medicine? Side effects that you should report to your doctor or health care professional as soon as possible: -allergic reactions like skin rash, itching or hives, swelling of the face, lips, or tongue -pain, redness, or irritation at site where injected -signs and symptoms of a dangerous change in heartbeat or heart rhythm like chest pain; dizziness; fast or irregular heartbeat; palpitations; feeling faint or lightheaded, falls; breathing problems -signs and symptoms of low blood pressure like dizziness; feeling faint or lightheaded, falls; unusually weak or tired Side effects that usually do not require medical attention (report to your doctor or health care professional if they continue or are bothersome): -headache -nausea, vomiting This list may not describe all possible side effects. Call your doctor for medical advice about side effects. You may report side effects to FDA at 1-800-FDA-1088. Where should I keep my medicine? This drug is given in a hospital or clinic and will not be stored at home. NOTE: This sheet is a summary. It may not cover all possible information. If you have questions about this medicine, talk to your doctor, pharmacist, or health care  provider.  2018 Elsevier/Salinda Snedeker Standard (2015-08-23 11:15:02) Heart Failure Heart failure means your heart has trouble pumping blood. This makes it hard for your body to work well. Heart failure is usually a long-term (chronic) condition. You must take good care of yourself and follow your doctor's treatment plan. Follow these instructions at home: Take your heart medicine as told by your doctor. Do not stop taking medicine unless your doctor tells you to. Do not skip any dose of medicine. Refill your medicines before they run out. Take other medicines only as told by your doctor or pharmacist. Stay active if told by your doctor. The elderly and people with severe heart failure should talk with a doctor about physical activity. Eat heart-healthy foods. Choose foods that are without trans fat and are low in saturated fat, cholesterol, and salt (sodium). This includes fresh or frozen fruits and vegetables, fish, lean meats, fat-free or low-fat dairy foods, whole grains, and high-fiber foods. Lentils and dried peas and beans (legumes) are also good choices. Limit salt if told by your doctor. Cook in a healthy way. Roast, grill, broil, bake, poach, steam, or stir-fry foods. Limit fluids as told by your doctor. Weigh yourself every morning. Do this after you pee (urinate) and before you eat breakfast. Write down your weight to give to your doctor. Take your blood pressure and write it down if your doctor tells you to. Ask your doctor how to check your pulse. Check your pulse as told. Lose weight if told by your doctor. Stop smoking or chewing tobacco. Do not use gum or patches that help you quit without  your doctor's approval. Schedule and go to doctor visits as told. Nonpregnant women should have no more than 1 drink a day. Men should have no more than 2 drinks a day. Talk to your doctor about drinking alcohol. Stop illegal drug use. Stay current with shots (immunizations). Manage your health  conditions as told by your doctor. Learn to manage your stress. Rest when you are tired. If it is really hot outside: Avoid intense activities. Use air conditioning or fans, or get in a cooler place. Avoid caffeine and alcohol. Wear loose-fitting, lightweight, and light-colored clothing. If it is really cold outside: Avoid intense activities. Layer your clothing. Wear mittens or gloves, a hat, and a scarf when going outside. Avoid alcohol. Learn about heart failure and get support as needed. Get help to maintain or improve your quality of life and your ability to care for yourself as needed. Contact a doctor if: You gain weight quickly. You are more short of breath than usual. You cannot do your normal activities. You tire easily. You cough more than normal, especially with activity. You have any or more puffiness (swelling) in areas such as your hands, feet, ankles, or belly (abdomen). You cannot sleep because it is hard to breathe. You feel like your heart is beating fast (palpitations). You get dizzy or light-headed when you stand up. Get help right away if: You have trouble breathing. There is a change in mental status, such as becoming less alert or not being able to focus. You have chest pain or discomfort. You faint. This information is not intended to replace advice given to you by your health care provider. Make sure you discuss any questions you have with your health care provider. Document Released: 04/29/2008 Document Revised: 12/27/2015 Document Reviewed: 09/06/2012 Elsevier Interactive Patient Education  2017 Elsevier Inc.  Coronary Artery Bypass Grafting, Care After These instructions give you information on caring for yourself after your procedure. Your doctor may also give you more specific instructions. Call your doctor if you have any problems or questions after your procedure. Follow these instructions at home:  Only take medicine as told by your doctor.  Take medicines exactly as told. Do not stop taking medicines or start any new medicines without talking to your doctor first.  Take your pulse as told by your doctor.  Do deep breathing as told by your doctor. Use your breathing device (incentive spirometer), if given, to practice deep breathing several times a day. Support your chest with a pillow or your arms when you take deep breaths or cough.  Keep the area clean, dry, and protected where the surgery cuts (incisions) were made. Remove bandages (dressings) only as told by your doctor. If strips were applied to surgical area, do not take them off. They fall off on their own.  Check the surgery area daily for puffiness (swelling), redness, or leaking fluid.  If surgery cuts were made in your legs:  Avoid crossing your legs.  Avoid sitting for long periods of time. Change positions every 30 minutes.  Raise your legs when you are sitting. Place them on pillows.  Wear stockings that help keep blood clots from forming in your legs (compression stockings).  Only take sponge baths until your doctor says it is okay to take showers. Pat the surgery area dry. Do not rub the surgery area with a washcloth or towel. Do not bathe, swim, or use a hot tub until your doctor says it is okay.  Eat foods that are high  in fiber. These include raw fruits and vegetables, whole grains, beans, and nuts. Choose lean meats. Avoid canned, processed, and fried foods.  Drink enough fluids to keep your pee (urine) clear or pale yellow.  Weigh yourself every day.  Rest and limit activity as told by your doctor. You may be told to:  Stop any activity if you have chest pain, shortness of breath, changes in heartbeat, or dizziness. Get help right away if this happens.  Move around often for short amounts of time or take short walks as told by your doctor. Gradually become more active. You may need help to strengthen your muscles and build endurance.  Avoid  lifting, pushing, or pulling anything heavier than 10 pounds (4.5 kg) for at least 6 weeks after surgery.  Do not drive until your doctor says it is okay.  Ask your doctor when you can go back to work.  Ask your doctor when you can begin sexual activity again.  Follow up with your doctor as told. Contact a doctor if:  You have puffiness, redness, more pain, or fluid draining from the incision site.  You have a fever.  You have puffiness in your ankles or legs.  You have pain in your legs.  You gain 2 or more pounds (0.9 kg) a day.  You feel sick to your stomach (nauseous) or throw up (vomit).  You have watery poop (diarrhea). Get help right away if:  You have chest pain that goes to your jaw or arms.  You have shortness of breath.  You have a fast or irregular heartbeat.  You notice a "clicking" in your breastbone when you move.  You have numbness or weakness in your arms or legs.  You feel dizzy or light-headed. This information is not intended to replace advice given to you by your health care provider. Make sure you discuss any questions you have with your health care provider. Document Released: 07/26/2013 Document Revised: 12/27/2015 Document Reviewed: 12/28/2012 Elsevier Interactive Patient Education  2017 [place] on my medicine - ELIQUIS (apixaban)  This medication education was reviewed with me or my healthcare representative as part of my discharge preparation.  The pharmacist that spoke with me during my hospital stay was:  Deboraha Sprang, [facility]  Why was Eliquis prescribed for you? Eliquis was prescribed for you to reduce the risk of a blood clot forming that can cause a stroke if you have a medical condition called atrial fibrillation (a type of irregular heartbeat).  What do You need to know about Eliquis ? Take your Eliquis TWICE DAILY - one tablet in the morning and one tablet in the evening with or without food. If you  have difficulty swallowing the tablet whole please discuss with your pharmacist how to take the medication safely.  Take Eliquis exactly as prescribed by your doctor and DO NOT stop taking Eliquis without talking to the doctor who prescribed the medication.  Stopping may increase your risk of developing a stroke.  Refill your prescription before you run out.  After discharge, you should have regular check-up appointments with your healthcare provider that is prescribing your Eliquis.  In the future your dose may need to be changed if your kidney function or weight changes by a significant amount or as you get older.  What do you do if you miss a dose? If you miss a dose, take it as soon as you remember on the same day and resume taking twice  daily.  Do not take more than one dose of ELIQUIS at the same time to make up a missed dose.  Important Safety Information A possible side effect of Eliquis is bleeding. You should call your healthcare provider right away if you experience any of the following: ? Bleeding from an injury or your nose that does not stop. ? Unusual colored urine (red or dark brown) or unusual colored stools (red or black). ? Unusual bruising for unknown reasons. ? A serious fall or if you hit your head (even if there is no bleeding).  Some medicines may interact with Eliquis and might increase your risk of bleeding or clotting while on Eliquis. To help avoid this, consult your healthcare provider or pharmacist prior to using any new prescription or non-prescription medications, including herbals, vitamins, non-steroidal anti-inflammatory drugs (NSAIDs) and supplements.  This website has more information on Eliquis (apixaban): http://www.eliquis.com/eliquis/home

## 2016-12-13 NOTE — Progress Notes (Addendum)
      [address]       [place] [address]             [phone]        9 Days Post-Op Procedure(s) (LRB): CORONARY ARTERY BYPASS GRAFTING (CABG)x 4 WITH ENDOSCOPIC HARVESTING OF RIGHT SAPHENOUS VEIN (N/A) CLIPPING OF ATRIAL APPENDAGE USING ATRICURE 45 ATRICLIP (Left) INTRAOPERATIVE TRANSESOPHAGEAL ECHOCARDIOGRAM (N/A) MAZE PROCEDURE (N/A)  Subjective: Patient not able to sleep well last few nights. No bowel movement with Lactulose so requesting enema.  Objective: Vital signs in last 24 hours: Temp:  [97.5 F (36.4 C)-97.8 F (36.6 C)] 97.5 F (36.4 C) (05/12 0520) Pulse Rate:  [84-98] 98 (05/12 0520) Cardiac Rhythm: Atrial fibrillation (05/12 0700) Resp:  [18-20] 18 (05/12 0520) BP: (97-129)/(54-81) 97/54 (05/12 0520) SpO2:  [94 %-96 %] 94 % (05/12 0520) Weight:  [84.3 kg (185 lb 12.8 oz)] 84.3 kg (185 lb 12.8 oz) (05/12 0520)  Pre op weight 77.6 kg Current Weight  12/13/16 84.3 kg (185 lb 12.8 oz)       Intake/Output from previous day: 05/11 0701 - 05/12 0700 In: 720 [P.O.:720] Out: 450 [Urine:450]   Physical Exam:  Cardiovascular: IRRR Pulmonary: Clear to auscultation on right and slightly diminished left base Abdomen: Soft, non tender, bowel sounds present. Extremities: Bilateral lower extremity edema. Wounds: Clean and dry.  No erythema or signs of infection.  Lab Results: CBC:  Recent Labs  12/11/16 0243  WBC 11.5*  HGB 8.1*  HCT 26.6*  PLT 371   BMET:   Recent Labs  12/12/16 0347 12/13/16 0325  NA 133* 133*  K 4.4 4.5  CL 97* 99*  CO2 25 24  GLUCOSE 107* 92  BUN 62* 56*  CREATININE 1.65* 1.33*  CALCIUM 8.4* 8.4*    PT/INR:  Lab Results  Component Value Date   INR 1.34 12/04/2016   INR 2.59 (H) 09/17/2015   INR 1.28 05/12/2015   ABG:  INR: Will add last result for INR, ABG once components are confirmed Will add last 4 CBG results once components are confirmed  Assessment/Plan:  1. CV - Previous a  flutter. She went into a fib with CVR yesterday afternoon (she had refused to take oral Lopressor). PAF with CVR. On Lopressor 25 mg bid and Xarelto. No ACE as creatinine still elevated.  2.  Pulmonary - On room air.  Encourage incentive spirometer and flutter valve. 3. Volume Overload - Continue with Lasix 40 mg daily but will give twice today to help with diuresis 4.  Acute blood loss anemia - H and H stable at 8.1 and 26.6 5. AKI-creatinine decreased from 1.65 to 1.33 6. DM-CBGs 133/121/83. On Glipizide 5 mg daily as taken pre op. Pre op HGA1C 7.0 7. Enema for constipation 8. Will need SNF when ready for discharge-likely Monday  ZIMMERMAN,DONIELLE MPA-C 12/13/2016,8:38 AM  I have seen and examined the patient and agree with the assessment and plan as outlined.  Rexene Alberts, MD 12/13/2016 12:02 PM

## 2016-12-14 LAB — BASIC METABOLIC PANEL
ANION GAP: 9 (ref 5–15)
BUN: 53 mg/dL — AB (ref 6–20)
CHLORIDE: 98 mmol/L — AB (ref 101–111)
CO2: 26 mmol/L (ref 22–32)
Calcium: 8.4 mg/dL — ABNORMAL LOW (ref 8.9–10.3)
Creatinine, Ser: 1.24 mg/dL — ABNORMAL HIGH (ref 0.44–1.00)
GFR calc non Af Amer: 44 mL/min — ABNORMAL LOW (ref >60)
GFR, EST AFRICAN AMERICAN: 51 mL/min — AB (ref >60)
Glucose, Bld: 180 mg/dL — ABNORMAL HIGH (ref 65–99)
POTASSIUM: 4.6 mmol/L (ref 3.5–5.1)
Sodium: 133 mmol/L — ABNORMAL LOW (ref 135–145)

## 2016-12-14 LAB — GLUCOSE, CAPILLARY
GLUCOSE-CAPILLARY: 160 mg/dL — AB (ref 65–99)
GLUCOSE-CAPILLARY: 200 mg/dL — AB (ref 65–99)
GLUCOSE-CAPILLARY: 84 mg/dL (ref 65–99)
Glucose-Capillary: 128 mg/dL — ABNORMAL HIGH (ref 65–99)

## 2016-12-14 MED ORDER — RIVAROXABAN 20 MG PO TABS
20.0000 mg | ORAL_TABLET | Freq: Every day | ORAL | Status: DC
  Administered 2016-12-14 – 2016-12-16 (×3): 20 mg via ORAL
  Filled 2016-12-14 (×3): qty 1

## 2016-12-14 MED ORDER — ALTEPLASE 2 MG IJ SOLR
2.0000 mg | Freq: Once | INTRAMUSCULAR | Status: AC
  Administered 2016-12-14: 2 mg

## 2016-12-14 MED ORDER — FUROSEMIDE 40 MG PO TABS
40.0000 mg | ORAL_TABLET | Freq: Two times a day (BID) | ORAL | Status: DC
  Administered 2016-12-14: 40 mg via ORAL
  Filled 2016-12-14: qty 1

## 2016-12-14 MED ORDER — SULFAMETHOXAZOLE-TRIMETHOPRIM 800-160 MG PO TABS
1.0000 | ORAL_TABLET | Freq: Two times a day (BID) | ORAL | Status: DC
  Administered 2016-12-14 – 2016-12-16 (×5): 1 via ORAL
  Filled 2016-12-14 (×6): qty 1

## 2016-12-14 MED ORDER — CIPROFLOXACIN HCL 500 MG PO TABS
500.0000 mg | ORAL_TABLET | Freq: Two times a day (BID) | ORAL | Status: DC
  Administered 2016-12-14: 500 mg via ORAL
  Filled 2016-12-14: qty 1

## 2016-12-14 NOTE — Plan of Care (Signed)
Problem: Bowel/Gastric: Goal: Gastrointestinal status for postoperative course will improve Outcome: Not Progressing Patient has not had BM since 05/08 despite medication assistance. Fleet enema ordered yesterday 05/12 but patient has refused x3. Will continue to encourage and educate.

## 2016-12-14 NOTE — Progress Notes (Addendum)
      [address]       [place] [address]             [phone]        10 Days Post-Op Procedure(s) (LRB): CORONARY ARTERY BYPASS GRAFTING (CABG)x 4 WITH ENDOSCOPIC HARVESTING OF RIGHT SAPHENOUS VEIN (N/A) CLIPPING OF ATRIAL APPENDAGE USING ATRICURE 45 ATRICLIP (Left) INTRAOPERATIVE TRANSESOPHAGEAL ECHOCARDIOGRAM (N/A) MAZE PROCEDURE (N/A)  Subjective: Patient did not sleep well despite taking Xanax. She had some incisional pain earlier that was relieved with Ultram.   Objective: Vital signs in last 24 hours: Temp:  [97.4 F (36.3 C)-97.9 F (36.6 C)] 97.4 F (36.3 C) (05/13 0359) Pulse Rate:  [84-103] 84 (05/13 0359) Cardiac Rhythm: Atrial fibrillation;Bundle branch block (05/13 0700) Resp:  [17-20] 17 (05/13 0359) BP: (96-123)/(57-101) 117/66 (05/13 0359) SpO2:  [95 %-98 %] 95 % (05/13 0359) Weight:  [84.9 kg (187 lb 1.6 oz)] 84.9 kg (187 lb 1.6 oz) (05/13 0359)  Pre op weight 77.6 kg Current Weight  12/14/16 84.9 kg (187 lb 1.6 oz)       Intake/Output from previous day: 05/12 0701 - 05/13 0700 In: 480 [P.O.:480] Out: 1550 [Urine:1550]   Physical Exam:  Cardiovascular: IRRR IRRR Pulmonary: Clear to auscultation on right and slightly diminished left base Abdomen: Soft, non tender, bowel sounds present. Extremities: Bilateral lower extremity edema. Wounds: Clean and dry.  No erythema or signs of infection.  Lab Results: CBC: No results for input(s): WBC, HGB, HCT, PLT in the last 72 hours. BMET:   Recent Labs  12/13/16 0325 12/14/16 0408  NA 133* 133*  K 4.5 4.6  CL 99* 98*  CO2 24 26  GLUCOSE 92 180*  BUN 56* 53*  CREATININE 1.33* 1.24*  CALCIUM 8.4* 8.4*    PT/INR:  Lab Results  Component Value Date   INR 1.34 12/04/2016   INR 2.59 (H) 09/17/2015   INR 1.28 05/12/2015   ABG:  INR: Will add last result for INR, ABG once components are confirmed Will add last 4 CBG results once components are  confirmed  Assessment/Plan:  1. CV - Previous a flutter and a fib. PAF with CVR. On Lopressor 25 mg bid and Xarelto.  2.  Pulmonary - On room air.  Encourage incentive spirometer and flutter valve. 3. Volume Overload - Continue with Lasix 40 mg daily  Bid  4.  Acute blood loss anemia - H and H stable at 8.1 and 26.6 5. AKI-creatinine decreased from 1.33 to 1.24 6. DM-CBGs 141/137/160. On Glipizide 5 mg daily as taken pre op. Pre op HGA1C 7.0 7. Urinalysis done yesterday showed large amount of leukocytes and many bacteria. UC pending. Will treat with Cipro. 8. Will need SNF when ready for discharge-likely in am  ZIMMERMAN,DONIELLE MPA-C 12/14/2016,8:39 AM  I have seen and examined the patient and agree with the assessment and plan as outlined.  Start empiric Cipro for possible UTI  Rexene Alberts, MD 12/14/2016 11:36 AM

## 2016-12-15 ENCOUNTER — Inpatient Hospital Stay: Payer: PPO | Admitting: Family Medicine

## 2016-12-15 LAB — GLUCOSE, CAPILLARY
GLUCOSE-CAPILLARY: 134 mg/dL — AB (ref 65–99)
GLUCOSE-CAPILLARY: 149 mg/dL — AB (ref 65–99)
GLUCOSE-CAPILLARY: 151 mg/dL — AB (ref 65–99)
Glucose-Capillary: 144 mg/dL — ABNORMAL HIGH (ref 65–99)

## 2016-12-15 LAB — CBC
HCT: 26.7 % — ABNORMAL LOW (ref 36.0–46.0)
HEMOGLOBIN: 8.3 g/dL — AB (ref 12.0–15.0)
MCH: 27.9 pg (ref 26.0–34.0)
MCHC: 31.1 g/dL (ref 30.0–36.0)
MCV: 89.9 fL (ref 78.0–100.0)
Platelets: 432 10*3/uL — ABNORMAL HIGH (ref 150–400)
RBC: 2.97 MIL/uL — AB (ref 3.87–5.11)
RDW: 17.4 % — ABNORMAL HIGH (ref 11.5–15.5)
WBC: 11.1 10*3/uL — AB (ref 4.0–10.5)

## 2016-12-15 MED ORDER — FUROSEMIDE 40 MG PO TABS
40.0000 mg | ORAL_TABLET | Freq: Every day | ORAL | Status: DC
  Administered 2016-12-15 – 2016-12-16 (×2): 40 mg via ORAL
  Filled 2016-12-15 (×2): qty 1

## 2016-12-15 MED ORDER — METOPROLOL TARTRATE 25 MG PO TABS
25.0000 mg | ORAL_TABLET | Freq: Two times a day (BID) | ORAL | Status: DC
  Administered 2016-12-15 – 2016-12-16 (×3): 25 mg via ORAL
  Filled 2016-12-15 (×5): qty 1

## 2016-12-15 MED ORDER — SPIRONOLACTONE 25 MG PO TABS
12.5000 mg | ORAL_TABLET | Freq: Every day | ORAL | Status: DC
  Administered 2016-12-15 – 2016-12-16 (×2): 12.5 mg via ORAL
  Filled 2016-12-15 (×2): qty 1

## 2016-12-15 MED ORDER — MAGNESIUM CITRATE PO SOLN
1.0000 | Freq: Once | ORAL | Status: AC
  Administered 2016-12-15: 1 via ORAL
  Filled 2016-12-15: qty 296

## 2016-12-15 NOTE — Progress Notes (Signed)
CARDIAC REHAB PHASE I  Fourth attempt to ambulate with pt today. Pt declines, states she is "too weak to walk", states "I am losing too much blood." Pt also c/o that she has not had a bowel movement yet. Encouraged ambulation as tolerated today. Will follow-up tomorrow.   Lenna Sciara, RN, BSN 12/15/2016 3:02 PM

## 2016-12-15 NOTE — Progress Notes (Signed)
Physical Therapy Treatment Patient Details Name: [name] MRN: [id number] DOB: May 10, 1946 Today's Date: 12/15/2016    History of Present Illness 71 yo s/p NSTEMI with CABG 5/3. PMhx: CAD, CHF, VT, orthostatic hypotension, ICD    PT Comments    Pt with flat affect today and appears down regarding lack of sleep, lack of BM and still being in the hospital on her birthday. Pt educated for all sternal precautions with mobility, benefit of mobility on bowels and HEP. Pt agreeable to limited ambulation and HEP only today. Will continue to follow.   HR 88-110   Follow Up Recommendations  SNF;Supervision/Assistance - 24 hour     Equipment Recommendations       Recommendations for Other Services       Precautions / Restrictions Precautions Precautions: Fall;Sternal    Mobility  Bed Mobility               General bed mobility comments: sitting EOB at start and end of session  Transfers Overall transfer level: Needs assistance   Transfers: Sit to/from Stand Sit to Stand: Supervision         General transfer comment: cues for hand placement at bed as not to push for adjusting hips. Pt able to stand from bed and [facility]  Ambulation/Gait Ambulation/Gait assistance: Supervision Ambulation Distance (Feet): 15 Feet Assistive device: Rolling walker (2 wheeled) Gait Pattern/deviations: Step-through pattern;Decreased stride length;Trunk flexed   Gait velocity interpretation: Below normal speed for age/gender General Gait Details: 15'x 2 as pt denied increased ambulation today due to fatigue and abdominal tightness. Cues for position in RW   Stairs            Wheelchair Mobility    Modified Rankin (Stroke Patients Only)       Balance Overall balance assessment: Needs assistance   Sitting balance-Leahy Scale: Good       Standing balance-Leahy Scale: Fair                              Cognition Arousal/Alertness: Awake/alert Behavior During  Therapy: Flat affect Overall Cognitive Status: Within Functional Limits for tasks assessed                                        Exercises General Exercises - Lower Extremity Long Arc Quad: AROM;20 reps;Both;Seated Hip Flexion/Marching: AROM;20 reps;Both;Seated Toe Raises: AROM;20 reps;Both;Seated Heel Raises: AROM;20 reps;Both;Seated    General Comments        Pertinent Vitals/Pain Faces Pain Scale: Hurts little more Pain Location: abdomen Pain Descriptors / Indicators: Tightness Pain Intervention(s): Limited activity within patient's tolerance    Home Living                      Prior Function            PT Goals (current goals can now be found in the care plan section) Progress towards PT goals: Progressing toward goals    Frequency           PT Plan Current plan remains appropriate    Co-evaluation              AM-PAC PT "6 Clicks" Daily Activity  Outcome Measure  Difficulty turning over in bed (including adjusting bedclothes, sheets and blankets)?: None Difficulty moving from lying on back to sitting on the  side of the bed? : None Difficulty sitting down on and standing up from a chair with arms (e.g., wheelchair, bedside commode, etc,.)?: None Help needed moving to and from a bed to chair (including a wheelchair)?: A Little Help needed walking in hospital room?: A Little Help needed climbing 3-5 steps with a railing? : A Little 6 Click Score: 21    End of Session   Activity Tolerance: Patient tolerated treatment well Patient left: in bed;with call bell/phone within reach (EOB) Nurse Communication: Mobility status;Precautions       Time: 0277-4128 PT Time Calculation (min) (ACUTE ONLY): 30 min  Charges:  $Therapeutic Exercise: 8-22 mins $Therapeutic Activity: 8-22 mins                    G Codes:       [name], PT [phone]   [place] 12/15/2016, 9:42 AM

## 2016-12-15 NOTE — NC FL2 (Signed)
[place] LEVEL OF CARE SCREENING TOOL     IDENTIFICATION  Patient Name: [name] Birthdate: 23-Sep-1945 Sex: female Admission Date (Current Location): 11/27/2016  [facility] and [place] Number:  Herbalist and Address:  [facility], [place] [address], [place], [place] [address]      Provider Number: [id number]  Attending Physician Name and Address:  Melrose Nakayama, MD  Relative Name and Phone Number:       Current Level of Care: Hospital Recommended Level of Care: [place] Prior Approval Number:    Date Approved/Denied:   PASRR Number: [id number]  Discharge Plan: SNF    Current Diagnoses: Patient Active Problem List   Diagnosis Date Noted  . Pressure injury of skin 12/05/2016  . 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 NSTEMI (non-ST elevated myocardial infarction) ([place]) 11/27/2016  . Acute on chronic diastolic (congestive) heart failure ([place]) 11/27/2016  . Ventricular fibrillation ([place]) 11/27/2016  . Head injury   . ICD (implantable cardioverter-defibrillator) discharge   . Diabetic neuropathy ([place]) 06/16/2016  . Chronic kidney disease 06/16/2016  . Fatigue 10/30/2015  . Afib ([place]) 10/02/2015  . Chronic combined systolic and diastolic heart failure ([place]) 10/02/2015  . Atrial flutter ([place]) 09/18/2015  . Chronic anticoagulation-Xarelto 09/18/2015  . Acute CHF (congestive heart failure) ([place]) 09/18/2015  . Acute diastolic heart failure ([place]) 09/17/2015  . Orthostatic hypotension   . ICD in place- [employer] Jude   . HLD (hyperlipidemia) 05/11/2015  . Syncope 05/11/2015  . Abdominal pain 05/11/2015  . AKI (acute kidney injury) ([place])   . Nausea & vomiting   . Hypothyroidism 04/27/2015  . Chronic diastolic CHF (congestive heart failure) ([place])   . Abnormal PFT   . CAD (coronary artery disease), native coronary artery - 3 vessel 04/21/2015  . Acute respiratory failure ([place])   . Ventricular tachycardia- Sept 2016-  ICD 04/13/2015  . Acute on chronic diastolic ACC/AHA stage C congestive heart failure ([place])   . UTI (lower urinary tract infection) 03/31/2015  . Sepsis ([place]) 03/31/2015  . Atrial fibrillation with rapid ventricular response ([place]) 03/31/2015  . Congestive heart disease ([place])   . Hypertrophic cardiomyopathy ([place])   . Septic bursitis of elbow 02/03/2013  . Acute blood loss anemia 01/08/2013    Class: Acute  . Diabetes mellitus type 2, noninsulin dependent ([place])   . Cellulitis 12/31/2012  . Hyponatremia 12/31/2012  . Hypokalemia 12/31/2012  . Fasting hyperglycemia 12/31/2012  . Leukocytosis 12/31/2012  . Hypotension 12/31/2012    Orientation RESPIRATION BLADDER Height & Weight     Self, Time, Situation, Place  Normal Continent Weight: 187 lb 4.8 oz (85 kg) Height:  5\' 4"  (162.6 cm)  BEHAVIORAL SYMPTOMS/MOOD NEUROLOGICAL BOWEL NUTRITION STATUS      Continent Diet (carb modified )  AMBULATORY STATUS COMMUNICATION OF NEEDS Skin   Limited Assist Verbally Normal                       Personal Care Assistance Level of Assistance  Bathing, Feeding, Dressing Bathing Assistance: Limited assistance   Dressing Assistance: Limited assistance     Functional Limitations Info  Sight, Hearing, Speech Sight Info: Adequate Hearing Info: Adequate Speech Info: Adequate    SPECIAL CARE FACTORS FREQUENCY  PT (By licensed PT), OT (By licensed OT)     PT Frequency: 5x wk OT Frequency: 5x wk            Contractures Contractures  Info: Not present    Additional Factors Info  Code Status, Allergies Code Status Info: full code Allergies Info: no know allergies            Current Medications (12/15/2016):  This is the current hospital active medication list Current Facility-Administered Medications  Medication Dose Route Frequency Provider Last Rate Last Dose  . 0.9 %  sodium chloride infusion  250 mL Intravenous PRN Melrose Nakayama, MD      . acetaminophen (TYLENOL)  tablet 1,000 mg  1,000 mg Oral Q6H PRN Melrose Nakayama, MD      . ALPRAZolam Duanne Moron) tablet 0.25 mg  0.25 mg Oral QHS PRN Lars Pinks M, PA-C   0.25 mg at 12/14/16 2222  . ALPRAZolam Duanne Moron) tablet 0.25 mg  0.25 mg Oral BID PRN Nani Skillern, PA-C   0.25 mg at 12/13/16 1125  . alum & mag hydroxide-simeth (MAALOX/MYLANTA) 200-200-20 MG/5ML suspension 15 mL  15 mL Oral Q4H PRN Melrose Nakayama, MD      . aspirin EC tablet 81 mg  81 mg Oral Daily Melrose Nakayama, MD   81 mg at 12/15/16 1003  . atorvastatin (LIPITOR) tablet 40 mg  40 mg Oral q1800 Melrose Nakayama, MD   40 mg at 12/14/16 1637  . bisacodyl (DULCOLAX) EC tablet 10 mg  10 mg Oral Daily Gold, Wayne E, PA-C   10 mg at 12/15/16 1002   Or  . bisacodyl (DULCOLAX) suppository 10 mg  10 mg Rectal Daily Gold, Wayne E, PA-C      . docusate sodium (COLACE) capsule 200 mg  200 mg Oral Daily Gold, Wayne E, PA-C   200 mg at 12/15/16 1003  . feeding supplement (ENSURE ENLIVE) (ENSURE ENLIVE) liquid 237 mL  237 mL Oral BID BM Melrose Nakayama, MD   237 mL at 12/14/16 1400  . folic acid (FOLVITE) tablet 1 mg  1 mg Oral Daily Grace Isaac, MD   1 mg at 12/15/16 1002  . furosemide (LASIX) tablet 40 mg  40 mg Oral Daily Melrose Nakayama, MD   40 mg at 12/15/16 1003  . glipiZIDE (GLUCOTROL) tablet 5 mg  5 mg Oral Q breakfast Melrose Nakayama, MD   5 mg at 12/15/16 1001  . insulin aspart (novoLOG) injection 0-15 Units  0-15 Units Subcutaneous TID WC Grace Isaac, MD   2 Units at 12/15/16 0654  . magnesium hydroxide (MILK OF MAGNESIA) suspension 30 mL  30 mL Oral Daily PRN Melrose Nakayama, MD      . MEDLINE mouth rinse  15 mL Mouth Rinse BID Grace Isaac, MD   15 mL at 12/13/16 0928  . metoprolol (LOPRESSOR) injection 2.5 mg  2.5 mg Intravenous BID PRN Melrose Nakayama, MD      . metoprolol tartrate (LOPRESSOR) tablet 25 mg  25 mg Oral BID Gold, Wayne E, PA-C   25 mg at 12/15/16  1004  . ondansetron (ZOFRAN) injection 4 mg  4 mg Intravenous Q6H PRN Gold, Wayne E, PA-C   4 mg at 12/11/16 0525  . oxyCODONE (Oxy IR/ROXICODONE) immediate release tablet 5-10 mg  5-10 mg Oral Q3H PRN Jadene Pierini E, PA-C   5 mg at 12/15/16 0248  . polyethylene glycol (MIRALAX / GLYCOLAX) packet 17 g  17 g Oral Daily PRN Melrose Nakayama, MD      . rivaroxaban Alveda Reasons) tablet 20 mg  20 mg Oral Q supper Karren Cobble,  RPH   20 mg at 12/14/16 1637  . sodium chloride flush (NS) 0.9 % injection 10-40 mL  10-40 mL Intracatheter PRN Melrose Nakayama, MD   20 mL at 12/13/16 0336  . sodium chloride flush (NS) 0.9 % injection 3 mL  3 mL Intravenous Q12H Melrose Nakayama, MD   3 mL at 12/14/16 2222  . sodium chloride flush (NS) 0.9 % injection 3 mL  3 mL Intravenous PRN Melrose Nakayama, MD      . spironolactone (ALDACTONE) tablet 12.5 mg  12.5 mg Oral Daily Melrose Nakayama, MD   12.5 mg at 12/15/16 1001  . sulfamethoxazole-trimethoprim (BACTRIM DS,SEPTRA DS) 800-160 MG per tablet 1 tablet  1 tablet Oral Q12H Nani Skillern, PA-C   1 tablet at 12/15/16 1003  . traMADol (ULTRAM) tablet 50-100 mg  50-100 mg Oral Q4H PRN Jadene Pierini E, PA-C   100 mg at 12/14/16 0136     Discharge Medications: Please see discharge summary for a list of discharge medications.  Relevant Imaging Results:  Relevant Lab Results:   Additional Information [id number]  Wende Neighbors, LCSW

## 2016-12-15 NOTE — Clinical Social Work Note (Signed)
Clinical Social Work Assessment  Patient Details  Name: [name] MRN: [id number] Date of Birth: Jul 09, 1946  Date of referral:  12/15/16               Reason for consult:  Discharge Planning                Permission sought to share information with:  Family Supports Permission granted to share information::  Yes, Verbal Permission Granted  Name::     Celedonio Savage   Agency::     Relationship::  grandson  Contact Information:  [phone]  Housing/Transportation Living arrangements for the past 2 months:  [place] of Information:  Patient Patient Interpreter Needed:  None Criminal Activity/Legal Involvement Pertinent to Current Situation/Hospitalization:  No - Comment as needed Significant Relationships:  None Lives with:    Do you feel safe going back to the place where you live?  Yes Need for family participation in patient care:  No (Coment)  Care giving concerns:  Patient lives at home with grandson and adult daughter. Patient stated that grandson works 6/days a week and would be unable to assist patient with care. Patient also stated that her adult daughter has a mental illness and she up and down constantly so she would not be able to help patient as well.  Social Worker assessment / plan:  Holiday representative met patient at bedside to offer support and discuss discharge options. Patient stated she is agreeable to discharge to SNF since no one in her home would be able to assist her with recovery. patient would like [facility] because she has been their in the past. CSW to complete necessary  Paperwork and initiate SNF search on patient behalf. CSW to follow up with pateint once bed are available. CSW remains available for support and to facilitate patient discharge needs.    Employment status:  Retired Forensic scientist:  Other (Comment Required) PT Recommendations:  [place] / Referral to community resources:  [employer]  Patient/Family's Response to care:  Patient verbalized appreciation and understanding for CSW role and involvement in care. Patient agreeable with current discharge plan to SNF. Patient would like [facility] to be called to transport patient to SNF   Patient/Family's Understanding of and Emotional Response to Diagnosis, Current Treatment, and Prognosis: Patient with good understanding of current medical state and limitations around most recent hospitalization  Emotional Assessment Appearance:  Appears stated age Attitude/Demeanor/Rapport:  Other Affect (typically observed):  Pleasant Orientation:  Oriented to Situation, Oriented to  Time, Oriented to Self, Oriented to Place Alcohol / Substance use:  Not Applicable Psych involvement (Current and /or in the community):  No (Comment)  Discharge Needs  Concerns to be addressed:  No discharge needs identified Readmission within the last 30 days:  No Current discharge risk:  None Barriers to Discharge:  No Barriers Identified   Wende Neighbors, LCSW 12/15/2016, 9:52 AM

## 2016-12-15 NOTE — Progress Notes (Addendum)
[address]       [place] [address]             [phone]      11 Days Post-Op Procedure(s) (LRB): CORONARY ARTERY BYPASS GRAFTING (CABG)x 4 WITH ENDOSCOPIC HARVESTING OF RIGHT SAPHENOUS VEIN (N/A) CLIPPING OF ATRIAL APPENDAGE USING ATRICURE 45 ATRICLIP (Left) INTRAOPERATIVE TRANSESOPHAGEAL ECHOCARDIOGRAM (N/A) MAZE PROCEDURE (N/A) Subjective: Feels poorly, c/o abdominal discomfort . Refused enema  Objective: Vital signs in last 24 hours: Temp:  [97.6 F (36.4 C)-98.4 F (36.9 C)] 97.6 F (36.4 C) (05/14 0423) Pulse Rate:  [57-88] 88 (05/14 0423) Cardiac Rhythm: Atrial fibrillation (05/13 1900) Resp:  [18] 18 (05/14 0423) BP: (92-109)/(55-87) 109/57 (05/14 0423) SpO2:  [94 %-97 %] 94 % (05/14 0423) Weight:  [187 lb 4.8 oz (85 kg)] 187 lb 4.8 oz (85 kg) (05/14 0423)  Hemodynamic parameters for last 24 hours:    Intake/Output from previous day: 05/13 0701 - 05/14 0700 In: 480 [P.O.:480] Out: 625 [Urine:625] Intake/Output this shift: No intake/output data recorded.  General appearance: alert, cooperative and no distress Heart: irregularly irregular rhythm Lungs: dim left base Abdomen: soft, non-tender, min dist Extremities: + LE edema Wound: incis healing well  Lab Results:  Recent Labs  12/15/16 0337  WBC 11.1*  HGB 8.3*  HCT 26.7*  PLT 432*   BMET:  Recent Labs  12/13/16 0325 12/14/16 0408  NA 133* 133*  K 4.5 4.6  CL 99* 98*  CO2 24 26  GLUCOSE 92 180*  BUN 56* 53*  CREATININE 1.33* 1.24*  CALCIUM 8.4* 8.4*    PT/INR: No results for input(s): LABPROT, INR in the last 72 hours. ABG    Component Value Date/Time   PHART 7.369 12/04/2016 2343   HCO3 24.0 12/04/2016 2343   TCO2 25 12/05/2016 1612   ACIDBASEDEF 1.0 12/04/2016 2343   O2SAT 63.1 12/08/2016 0620   CBG (last 3)   Recent Labs  12/14/16 1540 12/14/16 2106 12/15/16 0643  GLUCAP 128* 84 134*    Meds Scheduled Meds: . aspirin EC  81 mg Oral Daily    . atorvastatin  40 mg Oral q1800  . bisacodyl  10 mg Oral Daily   Or  . bisacodyl  10 mg Rectal Daily  . docusate sodium  200 mg Oral Daily  . feeding supplement (ENSURE ENLIVE)  237 mL Oral BID BM  . folic acid  1 mg Oral Daily  . furosemide  40 mg Oral BID  . glipiZIDE  5 mg Oral Q breakfast  . insulin aspart  0-15 Units Subcutaneous TID WC  . mouth rinse  15 mL Mouth Rinse BID  . metoprolol tartrate  12.5 mg Oral BID  . mineral oil  1 enema Rectal Once  . rivaroxaban  20 mg Oral Q supper  . sodium chloride flush  3 mL Intravenous Q12H  . sulfamethoxazole-trimethoprim  1 tablet Oral Q12H   Continuous Infusions: . sodium chloride     PRN Meds:.sodium chloride, acetaminophen, ALPRAZolam, ALPRAZolam, alum & mag hydroxide-simeth, magnesium hydroxide, metoprolol, ondansetron (ZOFRAN) IV, oxyCODONE, polyethylene glycol, sodium chloride flush, sodium chloride flush, traMADol  Xrays No results found.  Assessment/Plan: S/P Procedure(s) (LRB): CORONARY ARTERY BYPASS GRAFTING (CABG)x 4 WITH ENDOSCOPIC HARVESTING OF RIGHT SAPHENOUS VEIN (N/A) CLIPPING OF ATRIAL APPENDAGE USING ATRICURE 45 ATRICLIP (Left) INTRAOPERATIVE TRANSESOPHAGEAL ECHOCARDIOGRAM (N/A) MAZE PROCEDURE (N/A)  1 doing well , says she will take enema 2 afib, rate increased at times- will increase beta-blocker  3 H/H stable 4 on bactrim for UTI- cx pending 5 will get SW consult for SNF placement 6 cont BID lasix- consider spironolactone which has worked for her in past 7 pulm toilet and rehab  LOS: 18 days    GOLD,WAYNE E 12/15/2016 Patient seen and examined, agree with above Will add spironolactone, decrease lasix to once a day Ready for SNF when bed available  [name], MD [facility] [phone]

## 2016-12-15 NOTE — Care Management Important Message (Signed)
Important Message  Patient Details  Name: [name] MRN: [id number] Date of Birth: 03-Jul-1946   Medicare Important Message Given:  Yes    Nathen May 12/15/2016, 1:52 PM

## 2016-12-16 LAB — URINE CULTURE: Special Requests: NORMAL

## 2016-12-16 LAB — GLUCOSE, CAPILLARY
Glucose-Capillary: 180 mg/dL — ABNORMAL HIGH (ref 65–99)
Glucose-Capillary: 172 mg/dL — ABNORMAL HIGH (ref 65–99)
Glucose-Capillary: 130 mg/dL — ABNORMAL HIGH (ref 65–99)
Glucose-Capillary: 112 mg/dL — ABNORMAL HIGH (ref 65–99)

## 2016-12-16 MED ORDER — SODIUM CHLORIDE 0.9 % IV BOLUS (SEPSIS)
500.0000 mL | Freq: Once | INTRAVENOUS | Status: AC
  Administered 2016-12-16: 500 mL via INTRAVENOUS

## 2016-12-16 MED ORDER — POLYETHYLENE GLYCOL 3350 17 G PO PACK
17.0000 g | PACK | Freq: Every day | ORAL | Status: DC
  Administered 2016-12-16 – 2016-12-26 (×8): 17 g via ORAL
  Filled 2016-12-16 (×18): qty 1

## 2016-12-16 MED ORDER — BISACODYL 10 MG RE SUPP
10.0000 mg | Freq: Every day | RECTAL | Status: DC | PRN
  Filled 2016-12-16: qty 1

## 2016-12-16 MED ORDER — BISACODYL 5 MG PO TBEC: 10.0000 mg | DELAYED_RELEASE_TABLET | Freq: Every day | ORAL | Status: DC | PRN

## 2016-12-16 MED ORDER — PHENAZOPYRIDINE HCL 100 MG PO TABS
100.0000 mg | ORAL_TABLET | Freq: Three times a day (TID) | ORAL | Status: DC
  Administered 2016-12-16 (×2): 100 mg via ORAL
  Filled 2016-12-16 (×4): qty 1

## 2016-12-16 NOTE — Progress Notes (Signed)
Physical Therapy Treatment Patient Details Name: [name] MRN: [id number] DOB: [birth year] Today's Date: 12/16/2016    History of Present Illness 71 yo s/p NSTEMI with CABG 5/3. PMhx: CAD, CHF, VT, orthostatic hypotension, ICD    PT Comments    Pt smiling today and reports she was able to sleep and is feeling better. Pt initially willing to walk but after toileting pt firmly declined despite education. Pt performed HEP and encouraged gait later today. Will continue to follow.  HR 83-113   Follow Up Recommendations  SNF;Supervision/Assistance - 24 hour     Equipment Recommendations       Recommendations for Other Services       Precautions / Restrictions Precautions Precautions: Fall;Sternal    Mobility  Bed Mobility               General bed mobility comments: sitting EOB at start and end of session  Transfers Overall transfer level: Needs assistance   Transfers: Sit to/from Stand Sit to Stand: Supervision         General transfer comment: cues for hand placement at bed as not to push for adjusting hips, cues for hands with transfer. Pt able to stand from bed and [facility]  Ambulation/Gait Ambulation/Gait assistance: Supervision Ambulation Distance (Feet): 9 Feet Assistive device: Rolling walker (2 wheeled) Gait Pattern/deviations: Step-through pattern;Decreased stride length;Trunk flexed   Gait velocity interpretation: Below normal speed for age/gender General Gait Details: pt walked 5' to [facility] then 4' to sink and back to bed. pt declined attempting any further gait after toileting due to pain with urination and states pain makes her unable to walk. Pt educated for importance of gait but declined   Financial trader [employer] (Stroke Patients Only)       Balance                                            Cognition Arousal/Alertness: Awake/alert Behavior During Therapy: WFL for tasks  assessed/performed Overall Cognitive Status: Within Functional Limits for tasks assessed                                        Exercises General Exercises - Lower Extremity Long Arc Quad: AROM;20 reps;Both;Seated Hip Flexion/Marching: AROM;20 reps;Both;Seated    General Comments        Pertinent Vitals/Pain Pain Assessment: No/denies pain    Home Living                      Prior Function            PT Goals (current goals can now be found in the care plan section) Progress towards PT goals: Progressing toward goals    Frequency           PT Plan Current plan remains appropriate    Co-evaluation              AM-PAC PT "6 Clicks" Daily Activity  Outcome Measure  Difficulty turning over in bed (including adjusting bedclothes, sheets and blankets)?: None Difficulty moving from lying on back to sitting on the side of the bed? : None Difficulty sitting down on and standing up from a chair  with arms (e.g., wheelchair, bedside commode, etc,.)?: None Help needed moving to and from a bed to chair (including a wheelchair)?: A Little Help needed walking in hospital room?: A Little Help needed climbing 3-5 steps with a railing? : A Little 6 Click Score: 21    End of Session   Activity Tolerance: Patient tolerated treatment well Patient left: in bed;with call bell/phone within reach Nurse Communication: Mobility status;Precautions PT Visit Diagnosis: Difficulty in walking, not elsewhere classified (R26.2)     Time: 9458-5929 PT Time Calculation (min) (ACUTE ONLY): 25 min  Charges:  $Therapeutic Exercise: 8-22 mins $Therapeutic Activity: 8-22 mins                    G Codes:       [name], PT [phone]    [place] 12/16/2016, 9:56 AM

## 2016-12-16 NOTE — Progress Notes (Signed)
Nutrition Follow Up  DOCUMENTATION CODES:   Obesity unspecified  INTERVENTION:    Continue Ensure Enlive po BID, each supplement provides 350 kcal and 20 grams of protein  NUTRITION DIAGNOSIS:   Inadequate oral intake related to altered GI function (decreased appetite) as evidenced by per patient/family report, meal completion < 50%, ongoing  GOAL:   Patient will meet greater than or equal to 90% of their needs, progressing  MONITOR:   PO intake, Supplement acceptance, Labs, Weight trends, Skin, I & O's  ASSESSMENT:   Pt with hx of CAD, CHF, PAF, DM, pulmonary HTN admitted 4/26 with chest pain, s/p CABG x 5 on 5/4. Pt with nausea, finally had BM 5/7 after enema.    Pt continues on a Carbohydrate Modified diet. PO intake variable at 25-50% per flowsheet records. Receiving Ensure Enlive nutrition supplements. Labs and medications reviewed. CBG's 151-180-172.  Diet Order:  Diet Carb Modified Fluid consistency: Thin; Room service appropriate? 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  Estimated Nutritional Needs:   Kcal:  1600-1800  Protein:  95-115 grams  Fluid:  >1.7 L/day  EDUCATION NEEDS:   No education needs identified at this time  Arthur Holms, RD, LDN Pager #: [phone] After-Hours Pager #: [phone]

## 2016-12-16 NOTE — Progress Notes (Signed)
Pt. Became orthostatic and began to vomit. I page Gold PA, about her condition and her continuous wide QRS complex and tachycardia new orders placed and charted.

## 2016-12-16 NOTE — Progress Notes (Signed)
CARDIAC REHAB PHASE I   PRE:  Rate/Rhythm: 114 WCT    BP: sitting 98/72    SaO2: 97 RA  MODE:  Ambulation: stood   POST:  Rate/Rhythm: 113 WCT    BP: standing 78/56, back in bed, 99/59, 135/51    SaO2:    Pt in WCT and feeling weak but willing to try. BP low to begin with. Stood  and took BP immediately, which was 78/52m pt became lightheaded and nauseated. Had to sit, got her laying down, pt began vomiting for 5 min. Eventually BP up and pt with relief of nausea/vomiting. WCT turned to afib with pacing for a while in bed. Notified RN, left pt in bed. Used x2 assist for BP issues. [place], ACSM 12/16/2016 3:03 PM

## 2016-12-16 NOTE — Progress Notes (Addendum)
[address]       [place] [address]             [phone]      12 Days Post-Op Procedure(s) (LRB): CORONARY ARTERY BYPASS GRAFTING (CABG)x 4 WITH ENDOSCOPIC HARVESTING OF RIGHT SAPHENOUS VEIN (N/A) CLIPPING OF ATRIAL APPENDAGE USING ATRICURE 45 ATRICLIP (Left) INTRAOPERATIVE TRANSESOPHAGEAL ECHOCARDIOGRAM (N/A) MAZE PROCEDURE (N/A) Subjective: Feeling better today, + BM, + hematuria  Objective: Vital signs in last 24 hours: Temp:  [97.6 F (36.4 C)-98.7 F (37.1 C)] 97.7 F (36.5 C) (05/15 0415) Pulse Rate:  [93-101] 94 (05/15 0415) Cardiac Rhythm: Atrial fibrillation (05/14 1900) Resp:  [18-20] 18 (05/15 0415) BP: (101-171)/(73-138) 171/138 (05/15 0415) SpO2:  [92 %-97 %] 92 % (05/15 0415) Weight:  [187 lb 14.4 oz (85.2 kg)] 187 lb 14.4 oz (85.2 kg) (05/15 0415)  Hemodynamic parameters for last 24 hours:    Intake/Output from previous day: 05/14 0701 - 05/15 0700 In: 20 [I.V.:20] Out: 351 [Urine:350; Stool:1] Intake/Output this shift: No intake/output data recorded.  General appearance: alert, cooperative and no distress Heart: regular rate and rhythm Lungs: mildly dim in left base Abdomen: benign Extremities: edema a little better Wound: incis healing well  Lab Results:  Recent Labs  12/15/16 0337  WBC 11.1*  HGB 8.3*  HCT 26.7*  PLT 432*   BMET:  Recent Labs  12/14/16 0408  NA 133*  K 4.6  CL 98*  CO2 26  GLUCOSE 180*  BUN 53*  CREATININE 1.24*  CALCIUM 8.4*    PT/INR: No results for input(s): LABPROT, INR in the last 72 hours. ABG    Component Value Date/Time   PHART 7.369 12/04/2016 2343   HCO3 24.0 12/04/2016 2343   TCO2 25 12/05/2016 1612   ACIDBASEDEF 1.0 12/04/2016 2343   O2SAT 63.1 12/08/2016 0620   CBG (last 3)   Recent Labs  12/15/16 1649 12/15/16 2053 12/16/16 0615  GLUCAP 144* 151* 180*    Meds Scheduled Meds: . aspirin EC  81 mg Oral Daily  . atorvastatin  40 mg Oral q1800  .  bisacodyl  10 mg Oral Daily   Or  . bisacodyl  10 mg Rectal Daily  . docusate sodium  200 mg Oral Daily  . feeding supplement (ENSURE ENLIVE)  237 mL Oral BID BM  . folic acid  1 mg Oral Daily  . furosemide  40 mg Oral Daily  . glipiZIDE  5 mg Oral Q breakfast  . insulin aspart  0-15 Units Subcutaneous TID WC  . mouth rinse  15 mL Mouth Rinse BID  . metoprolol tartrate  25 mg Oral BID  . rivaroxaban  20 mg Oral Q supper  . sodium chloride flush  3 mL Intravenous Q12H  . spironolactone  12.5 mg Oral Daily  . sulfamethoxazole-trimethoprim  1 tablet Oral Q12H   Continuous Infusions: . sodium chloride     PRN Meds:.sodium chloride, acetaminophen, ALPRAZolam, ALPRAZolam, alum & mag hydroxide-simeth, magnesium hydroxide, metoprolol tartrate, ondansetron (ZOFRAN) IV, oxyCODONE, polyethylene glycol, sodium chloride flush, sodium chloride flush, traMADol  Xrays No results found.  Assessment/Plan: S/P Procedure(s) (LRB): CORONARY ARTERY BYPASS GRAFTING (CABG)x 4 WITH ENDOSCOPIC HARVESTING OF RIGHT SAPHENOUS VEIN (N/A) CLIPPING OF ATRIAL APPENDAGE USING ATRICURE 45 ATRICLIP (Left) INTRAOPERATIVE TRANSESOPHAGEAL ECHOCARDIOGRAM (N/A) MAZE PROCEDURE (N/A)  1 steady progress, but slow 2 + UTI, + E Coli>100,000, sens to septra, probable cystitis- may need to consider holding xarelto short term. Add pyridium 3 had  a BM 4 afib, rate pretty well controlled overall 5 edema improved, cont diuresis 6 cont PT/rehab 7 SNF placement at D/C      LOS: 19 days    GOLD,WAYNE E 12/16/2016 Patient seen and examined, agree with above Primary complaint is dysuria- E coli UTI- on Bactrim BP  up this AM- if continues to be elevated will add low dose ARB Needs to ambulate more  [name], MD [facility] [phone]

## 2016-12-17 ENCOUNTER — Inpatient Hospital Stay (HOSPITAL_COMMUNITY): Payer: PPO

## 2016-12-17 DIAGNOSIS — I481 Persistent atrial fibrillation: Secondary | ICD-10-CM

## 2016-12-17 DIAGNOSIS — I5023 Acute on chronic systolic (congestive) heart failure: Secondary | ICD-10-CM

## 2016-12-17 DIAGNOSIS — N183 Chronic kidney disease, stage 3 (moderate): Secondary | ICD-10-CM

## 2016-12-17 DIAGNOSIS — R570 Cardiogenic shock: Secondary | ICD-10-CM

## 2016-12-17 LAB — BASIC METABOLIC PANEL
Anion gap: 6 (ref 5–15)
Anion gap: 9 (ref 5–15)
BUN: 56 mg/dL — AB (ref 6–20)
BUN: 56 mg/dL — AB (ref 6–20)
CALCIUM: 8.4 mg/dL — AB (ref 8.9–10.3)
CALCIUM: 8.7 mg/dL — AB (ref 8.9–10.3)
CHLORIDE: 99 mmol/L — AB (ref 101–111)
CO2: 27 mmol/L (ref 22–32)
CO2: 27 mmol/L (ref 22–32)
CREATININE: 1.57 mg/dL — AB (ref 0.44–1.00)
CREATININE: 1.59 mg/dL — AB (ref 0.44–1.00)
Chloride: 99 mmol/L — ABNORMAL LOW (ref 101–111)
GFR calc Af Amer: 38 mL/min — ABNORMAL LOW (ref >60)
GFR calc Af Amer: 37 mL/min — ABNORMAL LOW (ref >60)
GFR calc non Af Amer: 33 mL/min — ABNORMAL LOW (ref >60)
GFR calc non Af Amer: 32 mL/min — ABNORMAL LOW (ref >60)
GLUCOSE: 186 mg/dL — AB (ref 65–99)
Glucose, Bld: 166 mg/dL — ABNORMAL HIGH (ref 65–99)
Potassium: 6.1 mmol/L — ABNORMAL HIGH (ref 3.5–5.1)
Potassium: 5.7 mmol/L — ABNORMAL HIGH (ref 3.5–5.1)
Sodium: 132 mmol/L — ABNORMAL LOW (ref 135–145)
Sodium: 135 mmol/L (ref 135–145)

## 2016-12-17 LAB — COMPREHENSIVE METABOLIC PANEL
ALBUMIN: 3.1 g/dL — AB (ref 3.5–5.0)
ALK PHOS: 120 U/L (ref 38–126)
ALT: 29 U/L (ref 14–54)
ANION GAP: 8 (ref 5–15)
AST: 23 U/L (ref 15–41)
BUN: 56 mg/dL — ABNORMAL HIGH (ref 6–20)
CALCIUM: 8.7 mg/dL — AB (ref 8.9–10.3)
CO2: 27 mmol/L (ref 22–32)
Chloride: 100 mmol/L — ABNORMAL LOW (ref 101–111)
Creatinine, Ser: 1.61 mg/dL — ABNORMAL HIGH (ref 0.44–1.00)
GFR calc Af Amer: 37 mL/min — ABNORMAL LOW (ref >60)
GFR calc non Af Amer: 32 mL/min — ABNORMAL LOW (ref >60)
Glucose, Bld: 159 mg/dL — ABNORMAL HIGH (ref 65–99)
Potassium: 5.7 mmol/L — ABNORMAL HIGH (ref 3.5–5.1)
SODIUM: 135 mmol/L (ref 135–145)
TOTAL PROTEIN: 6.6 g/dL (ref 6.5–8.1)
Total Bilirubin: 0.6 mg/dL (ref 0.3–1.2)

## 2016-12-17 LAB — GLUCOSE, CAPILLARY
GLUCOSE-CAPILLARY: 135 mg/dL — AB (ref 65–99)
GLUCOSE-CAPILLARY: 166 mg/dL — AB (ref 65–99)
Glucose-Capillary: 166 mg/dL — ABNORMAL HIGH (ref 65–99)
Glucose-Capillary: 132 mg/dL — ABNORMAL HIGH (ref 65–99)

## 2016-12-17 LAB — COOXEMETRY PANEL
Carboxyhemoglobin: 1.3 % (ref 0.5–1.5)
Carboxyhemoglobin: 1.4 % (ref 0.5–1.5)
Carboxyhemoglobin: 1.2 % (ref 0.5–1.5)
METHEMOGLOBIN: 1.1 % (ref 0.0–1.5)
METHEMOGLOBIN: 1.1 % (ref 0.0–1.5)
Methemoglobin: 1.3 % (ref 0.0–1.5)
O2 Saturation: 36.3 %
O2 Saturation: 41.1 %
O2 Saturation: 34.3 %
TOTAL HEMOGLOBIN: 7.8 g/dL — AB (ref 12.0–16.0)
Total hemoglobin: 7.8 g/dL — ABNORMAL LOW (ref 12.0–16.0)
Total hemoglobin: 7.6 g/dL — ABNORMAL LOW (ref 12.0–16.0)

## 2016-12-17 LAB — CARBOXYHEMOGLOBIN - COOX: Carboxyhemoglobin: 1.5 % (ref 0.5–1.5)

## 2016-12-17 LAB — BRAIN NATRIURETIC PEPTIDE: B NATRIURETIC PEPTIDE 5: 1062.6 pg/mL — AB (ref 0.0–100.0)

## 2016-12-17 MED ORDER — FUROSEMIDE 10 MG/ML IJ SOLN
80.0000 mg | Freq: Two times a day (BID) | INTRAMUSCULAR | Status: DC
  Administered 2016-12-17 – 2016-12-18 (×3): 80 mg via INTRAVENOUS
  Filled 2016-12-17 (×3): qty 8

## 2016-12-17 MED ORDER — MEXILETINE HCL 200 MG PO CAPS: 200.0000 mg | ORAL_CAPSULE | Freq: Two times a day (BID) | ORAL | Status: DC

## 2016-12-17 MED ORDER — MEXILETINE HCL 200 MG PO CAPS
200.0000 mg | ORAL_CAPSULE | Freq: Two times a day (BID) | ORAL | Status: DC
  Administered 2016-12-17: 200 mg via ORAL
  Filled 2016-12-17: qty 1

## 2016-12-17 MED ORDER — NOREPINEPHRINE BITARTRATE 1 MG/ML IV SOLN
0.0000 ug/min | INTRAVENOUS | Status: DC
  Filled 2016-12-17: qty 16

## 2016-12-17 MED ORDER — AMIODARONE LOAD VIA INFUSION
150.0000 mg | Freq: Once | INTRAVENOUS | Status: AC
  Administered 2016-12-17: 150 mg via INTRAVENOUS
  Filled 2016-12-17: qty 83.34

## 2016-12-17 MED ORDER — SODIUM POLYSTYRENE SULFONATE 15 GM/60ML PO SUSP
30.0000 g | Freq: Once | ORAL | Status: AC
  Administered 2016-12-17: 30 g via ORAL
  Filled 2016-12-17: qty 120

## 2016-12-17 MED ORDER — DEXTROSE 5 % IV SOLN
1.0000 g | INTRAVENOUS | Status: DC
  Administered 2016-12-17 – 2016-12-19 (×3): 1 g via INTRAVENOUS
  Filled 2016-12-17 (×4): qty 10

## 2016-12-17 MED ORDER — NOREPINEPHRINE BITARTRATE 1 MG/ML IV SOLN
0.0000 ug/min | INTRAVENOUS | Status: DC
  Filled 2016-12-17: qty 4

## 2016-12-17 MED ORDER — NOREPINEPHRINE BITARTRATE 1 MG/ML IV SOLN
5.0000 ug/min | INTRAVENOUS | Status: DC
  Administered 2016-12-17: 5 ug/min via INTRAVENOUS
  Filled 2016-12-17: qty 16

## 2016-12-17 MED ORDER — SODIUM CHLORIDE 0.9 % IV SOLN: 250.0000 mL | INTRAVENOUS | Status: DC | PRN

## 2016-12-17 MED ORDER — FUROSEMIDE 10 MG/ML IJ SOLN
80.0000 mg | INTRAMUSCULAR | Status: AC
  Administered 2016-12-17: 80 mg via INTRAVENOUS
  Filled 2016-12-17: qty 8

## 2016-12-17 MED ORDER — MILRINONE LACTATE IN DEXTROSE 20-5 MG/100ML-% IV SOLN
0.1250 ug/kg/min | INTRAVENOUS | Status: DC
  Administered 2016-12-17: 0.375 ug/kg/min via INTRAVENOUS
  Administered 2016-12-17: 0.25 ug/kg/min via INTRAVENOUS
  Administered 2016-12-18 – 2016-12-20 (×5): 0.375 ug/kg/min via INTRAVENOUS
  Administered 2016-12-20: 0.25 ug/kg/min via INTRAVENOUS
  Administered 2016-12-21 (×2): 0.375 ug/kg/min via INTRAVENOUS
  Administered 2016-12-21 – 2016-12-22 (×2): 0.25 ug/kg/min via INTRAVENOUS
  Administered 2016-12-22: 0.375 ug/kg/min via INTRAVENOUS
  Administered 2016-12-23: 0.25 ug/kg/min via INTRAVENOUS
  Administered 2016-12-23: 0.125 ug/kg/min via INTRAVENOUS
  Filled 2016-12-17 (×14): qty 100

## 2016-12-17 MED ORDER — AMIODARONE HCL IN DEXTROSE 360-4.14 MG/200ML-% IV SOLN
30.0000 mg/h | INTRAVENOUS | Status: DC
  Administered 2016-12-18 – 2016-12-20 (×5): 30 mg/h via INTRAVENOUS
  Filled 2016-12-17 (×5): qty 200

## 2016-12-17 MED ORDER — AMIODARONE HCL IN DEXTROSE 360-4.14 MG/200ML-% IV SOLN
60.0000 mg/h | INTRAVENOUS | Status: AC
  Administered 2016-12-17 (×2): 60 mg/h via INTRAVENOUS
  Filled 2016-12-17 (×3): qty 200

## 2016-12-17 MED ORDER — NOREPINEPHRINE BITARTRATE 1 MG/ML IV SOLN: 0.0000 ug/min | INTRAVENOUS | Status: DC

## 2016-12-17 MED ORDER — FUROSEMIDE 10 MG/ML IJ SOLN: 80.0000 mg | Freq: Two times a day (BID) | INTRAMUSCULAR | Status: DC

## 2016-12-17 NOTE — Progress Notes (Signed)
Clinical Social Worker still following patients for support and discharge needs. Patient has a bed offer at her first choice SNF [facility]).  Rhea Pink, MSW,  [place]

## 2016-12-17 NOTE — Consult Note (Signed)
   [facility] Inpatient Consult   12/17/2016  [place] 11-Sep-1945 [phone]    [facility] Care Management follow up. Spoke with inpatient RNCM to confirm discharge plan is for SNF. No identifiable [facility] Care Management needs at this time. If discharge plans should change, please contact:  [name], [place], RN,BSN [facility] Liaison [phone]

## 2016-12-17 NOTE — Progress Notes (Signed)
TCTS BRIEF SICU PROGRESS NOTE  13 Days Post-Op  S/P Procedure(s) (LRB): CORONARY ARTERY BYPASS GRAFTING (CABG)x 4 WITH ENDOSCOPIC HARVESTING OF RIGHT SAPHENOUS VEIN (N/A) CLIPPING OF ATRIAL APPENDAGE USING ATRICURE 45 ATRICLIP (Left) INTRAOPERATIVE TRANSESOPHAGEAL ECHOCARDIOGRAM (N/A) MAZE PROCEDURE (N/A)   Patient reports feeling better on milrinone and levophed Afib w/ stable HR and BP O2 sats 100% on NRB facemase Making some urine  Plan: Appreciate assistance from advanced heart failure team  Rexene Alberts, MD 12/17/2016 10:06 PM

## 2016-12-17 NOTE — Progress Notes (Signed)
   ADVANCED HF TEAM  PROGRESS NOTE  Patient seen tonight on evening rounds.   She has developed progressive shock.  On exam  Pale and cool c/o severe nausea JVP to jaw Cor IRR tachy Ab: soft mild diffuse tenderness. Hypoactive BS Ext: Cool + edema  Co-ox 34% despite milrinone. Remains on amio gtt.   We personally transported patient to ICU for progressive shock.   I performed bedside echo. Limited windows. LVEF suprisingly good 40-45%. RV not seen well but appears to have adequate function. There is a small to moderate posterior pericardial effusion but no convincing evidence of tamponade on limited images.   We consider taking patient to cath lab for swan and possible IABP but co-ox improved to 59%. Actually became quite hypertensive on norepi 5 so turned off but SBP quickly dropped to 90s so back on NE at 1-2.  Will plan full echo and probable Swan in am to guide further therapy. Continue amio for AF. Also has UTI which is being treated.   CRITICAL CARE Performed by: Glori Bickers  Total critical care time: 60 minutes  Critical care time was exclusive of separately billable procedures and treating other patients.  Critical care was necessary to treat or prevent imminent or life-threatening deterioration.  Critical care was time spent personally by me (independent of midlevel providers or residents) on the following activities: development of treatment plan with patient and/or surrogate as well as nursing, discussions with consultants, evaluation of patient's response to treatment, examination of patient, obtaining history from patient or surrogate, ordering and performing treatments and interventions, ordering and review of laboratory studies, ordering and review of radiographic studies, pulse oximetry and re-evaluation of patient's condition.  Glori Bickers, MD  9:58 PM

## 2016-12-17 NOTE — Progress Notes (Signed)
CO ox worsening, now 34%. Will transfer to ICU, increase milrinone and start Levo.

## 2016-12-17 NOTE — Consult Note (Signed)
Advanced Heart Failure Team Consult Note   Primary Physician: Arnoldo Morale, MD Primary Cardiologist:  Dr. Haroldine Laws   Reason for Consultation: CHF   HPI:    [name] is seen today for evaluation of chronic systolic CHF, diuretic management at the request of Dr. Roxan Hockey.   [name] is a 71 year old female with a past medical history of CAD (3 vessel CAD by cath in 04/2015), PAF (on Xarelto), Type 2 DM, VT (s/p placement of St. Jude ICD in 04/2015). She presented to the ED on 11/27/16 with syncope.   She was last examined by Dr. Haroldine Laws in 03/2016 and volume status was stable at that time. A direct consult with TCTS was offered but she stated she did not want surgery. Her last device interrogation was in 01/2016 and showed < 1% AT/AF burden with stable thoracic impedance.   She presented to the ED on 11/27/16 with syncope, ICD showed 6 episodes of VF with the initial 3 episodes requiring ATP, then recurrent episodes at which her rate was in the 350's and 30J was delivered with termination of the rhythm. EKG with significant ST depression in the inferior leads.   Left heart cath showed severe 3 vessel CAD, CTO of RCA, total occlusion of mid circumflex, 90% stenosis in proximal LAD. She underwent CABG on 12/04/16 by Dr. Roxan Hockey, also had left atrial appendage clipping and MAZE procedure. She developed rapid afib/flutter post operatively, she was started on IV amiodarone. She converted to NSR briefly, but no po Amio was started as she had been on it in the past and caused severe nausea. She was volume overloaded so lasix was started, but since has been stopped as it was felt that she is dry, creatinine rising.  She is feeling terrible, nauseous, vomiting. Denies chest pain. Endorses orthopnea. Weight up 14 pounds since admission.    Review of Systems: [y] = yes, [ ]  = no   General: Weight gain [ y]; Weight loss [ ] ; Anorexia [ ] ; Fatigue [ ] ; Fever [ ] ; Chills [ ] ; Weakness  [ ]   Cardiac: Chest pain/pressure [ ] ; Resting SOB Blue.Reese ]; Exertional SOB [ ] ; Orthopnea Blue.Reese ]; Pedal Edema [ ] ; Palpitations [ ] ; Syncope [ ] ; Presyncope [ ] ; Paroxysmal nocturnal dyspnea[ ]   Pulmonary: Cough [ ] ; Wheezing[ ] ; Hemoptysis[ ] ; Sputum [ ] ; Snoring [ ]   GI: Vomiting[ y]; Dysphagia[ ] ; Melena[ ] ; Hematochezia [ ] ; Heartburn[ ] ; Abdominal pain [ ] ; Constipation [ ] ; Diarrhea [ ] ; BRBPR [ ]   GU: Hematuria[y ]; Dysuria [ ] ; Nocturia[ ]   Vascular: Pain in legs with walking [ ] ; Pain in feet with lying flat [ ] ; Non-healing sores [ ] ; Stroke [ ] ; TIA [ ] ; Slurred speech [ ] ;  Neuro: Headaches[ ] ; Vertigo[ ] ; Seizures[ ] ; Paresthesias[ ] ;Blurred vision [ ] ; Diplopia [ ] ; Vision changes [ ]   Ortho/Skin: Arthritis [ ] ; Joint pain [ ] ; Muscle pain [ ] ; Joint swelling [ ] ; Back Pain [ ] ; Rash [ ]   Psych: Depression[ ] ; Anxiety[ ]   Heme: Bleeding problems [ ] ; Clotting disorders [ ] ; Anemia [ ]   Endocrine: Diabetes [ ] ; Thyroid dysfunction[ ]   Home Medications Prior to Admission medications   Medication Sig Start Date End Date Taking? Authorizing Provider  acetaminophen (TYLENOL) 325 MG tablet Take 2 tablets (650 mg total) by mouth every 4 (four) hours as needed for headache or mild pain. 09/21/15  Yes Kilroy, Luke K, PA-C  Blood Glucose  Monitoring Suppl (TRUE METRIX METER) DEVI 1 each by Does not apply route 2 (two) times daily. 04/27/15  Yes Arnoldo Morale, MD  furosemide (LASIX) 40 MG tablet Take 1 tablet (40 mg total) by mouth every other day. May take extra tablet once daily as needed for weight gain/edema. 01/02/16  Yes Bensimhon, Shaune Pascal, MD  glipiZIDE (GLUCOTROL) 5 MG tablet Take 1 tablet (5 mg total) by mouth daily. 06/16/16  Yes Arnoldo Morale, MD  glucose blood (TRUE METRIX BLOOD GLUCOSE TEST) test strip Use as instructed twice daily 04/27/15  Yes Amao, Charlane Ferretti, MD  rivaroxaban (XARELTO) 20 MG TABS tablet Take 1 tablet (20 mg total) by mouth daily. 06/17/16  Yes Arnoldo Morale, MD  TRUEPLUS  LANCETS 28G MISC Use as instructed twice daily. 04/27/15  Yes Arnoldo Morale, MD    Past Medical History: Past Medical History:  Diagnosis Date  . Abnormal PFT   . CAD (coronary artery disease), native coronary artery - 3 vessel 04/21/2015   a. NSTEMI 8-04/2015 felt 2/2 demand ischemia. b. 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  . Chronic diastolic CHF (congestive heart failure) ([place])   . Diabetes mellitus ([place])   . Diabetes mellitus without complication ([place])    dx on wed  . Hypertrophic cardiomyopathy ([place])   . Left atrial enlargement   . Mitral regurgitation    a. Echo 04/2015: moderate mitral regurgitation. b. F/u echo 05/2015: mild MR.  . NSTEMI (non-ST elevated myocardial infarction) ([place]) 11/27/2016  . Paroxysmal atrial fibrillation ([name])   . Shortness of breath dyspnea   . Tricuspid regurgitation    a. Echo 04/2015: Mod-severe TR. b. Not mentioned on echo 05/2015  . Ventricular tachycardia ([place])    a. s/p St. Jude ICD implanted 04/2015.    Past Surgical History: Past Surgical History:  Procedure Laterality Date  . APPLICATION OF WOUND VAC Left 01/06/2013   Procedure: APPLICATION OF WOUND VAC X 2;  Surgeon: [name], MD;  Location: [facility];  Service: Orthopedics;  Laterality: Left;  left forearm  . APPLICATION OF WOUND VAC Left 01/09/2013   Procedure: APPLICATION OF WOUND VAC;  Surgeon: [name], MD;  Location: [facility];  Service: Orthopedics;  Laterality: Left;  . CARDIAC CATHETERIZATION N/A 04/19/2015   Procedure: Left Heart Cath and Coronary Angiography;  Surgeon: [name], MD;  Location: [place] [facility];  Service: Cardiovascular;  Laterality: N/A;  . CARDIOVERSION N/A 09/20/2015   Procedure: CARDIOVERSION;  Surgeon: Pixie Casino, MD;  Location: [facility] ENDOSCOPY;  Service: Cardiovascular;  Laterality: N/A;  . CLIPPING OF ATRIAL APPENDAGE Left 12/04/2016   Procedure: CLIPPING OF ATRIAL APPENDAGE USING  ATRICURE 25 ATRICLIP;  Surgeon: Melrose Nakayama, MD;  Location: [place];  Service: Open Heart Surgery;  Laterality: Left;  . CORONARY ARTERY BYPASS GRAFT N/A 12/04/2016   Procedure: CORONARY ARTERY BYPASS GRAFTING (CABG)x 4 WITH ENDOSCOPIC HARVESTING OF RIGHT SAPHENOUS VEIN;  Surgeon: Melrose Nakayama, MD;  Location: [place];  Service: Open Heart Surgery;  Laterality: N/A;  . DENTAL SURGERY  Oct. 2011   several extractions, bone graft  . EP IMPLANTABLE DEVICE N/A 04/16/2015   Procedure: ICD Implant;  Surgeon: [name], MD;  Location: [place] [facility];  Service: Cardiovascular;  Laterality: N/A;  . I&D EXTREMITY Left 01/06/2013   Procedure: IRRIGATION AND DEBRIDEMENT LEFT ELBOW AND LEFT FOREARM ;  Surgeon: [name], MD;  [facility];  Service: Orthopedics;  Laterality: Left;  . INCISION AND DRAINAGE Left 01/09/2013   Procedure: REDO INCISION AND DRAINAGE LEFT ELBOW;  Surgeon: [name], MD;  [facility]: [facility];  Service: Orthopedics;  Laterality: Left;  SUPINE, UPPER EXTERMITY DRAPE  . INTRAOPERATIVE TRANSESOPHAGEAL ECHOCARDIOGRAM N/A 12/04/2016   Procedure: INTRAOPERATIVE TRANSESOPHAGEAL ECHOCARDIOGRAM;  Surgeon: Melrose Nakayama, MD;  Location: [place];  Service: Open Heart Surgery;  Laterality: N/A;  . LEFT HEART CATH AND CORONARY ANGIOGRAPHY N/A 11/28/2016   Procedure: Left Heart Cath and Coronary Angiography;  Surgeon: Belva Crome, MD;  Location: [place] [facility];  Service: Cardiovascular;  Laterality: N/A;  . MAZE N/A 12/04/2016   Procedure: MAZE PROCEDURE;  Surgeon: Melrose Nakayama, MD;  Location: [place];  Service: Open Heart Surgery;  Laterality: N/A;  . SECONDARY CLOSURE OF WOUND Left 01/09/2013   Procedure: SECONDARY CLOSURE OF WOUND  LEFT ELBOW;  Surgeon: [name], MD;  [facility];  Service: Orthopedics;  Laterality: Left;  . TEE WITHOUT CARDIOVERSION N/A 09/20/2015   Procedure: TRANSESOPHAGEAL ECHOCARDIOGRAM (TEE);  Surgeon: Pixie Casino, MD;   Location: [facility] ENDOSCOPY;  Service: Cardiovascular;  Laterality: N/A;    Family History: Family History  Problem Relation Age of Onset  . Breast cancer Mother   . Diabetes Father   . Stroke Father   . Heart disease Sister   . Healthy Sister     Social History: Social History   Social History  . Marital status: Divorced    Spouse name: N/A  . Number of children: N/A  . Years of education: N/A   Social History Main Topics  . Smoking status: Former Smoker    Years: 4.00    Quit date: 12/31/1980  . Smokeless tobacco: Never Used  . Alcohol use No  . Drug use: No  . Sexual activity: Not Currently   Other Topics Concern  . None   Social History Narrative   Divorced.  Lives with daughter and grandson. 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    Allergies:  Allergies  Allergen Reactions  . No Known Allergies     Objective:    Vital Signs:   Temp:  [97.5 F (36.4 C)-97.6 F (36.4 C)] 97.5 F (36.4 C) (05/16 0510) Pulse Rate:  [78-98] 78 (05/16 0510) Resp:  [18] 18 (05/16 0510) BP: (85-122)/(45-65) 94/45 (05/16 0510) SpO2:  [90 %-99 %] 90 % (05/16 0510) Weight:  [189 lb 2.5 oz (85.8 kg)] 189 lb 2.5 oz (85.8 kg) (05/16 0510) Last BM Date: 12/16/16  Weight change: Filed Weights   12/15/16 0423 12/16/16 0415 12/17/16 0510  Weight: 187 lb 4.8 oz (85 kg) 187 lb 14.4 oz (85.2 kg) 189 lb 2.5 oz (85.8 kg)    Intake/Output:   Intake/Output Summary (Last 24 hours) at 12/17/16 1036 Last data filed at 12/17/16 0840  Gross per 24 hour  Intake              130 ml  Output                0 ml  Net              130 ml     Physical Exam: General: Ill appearing female, appears fatigued and stressed.  HEENT: normal Neck: supple. JVP to jaw with brisk v waves . Carotids 2+ bilat; no bruits. No lymphadenopathy or thyromegaly appreciated. Cor: PMI nondisplaced. irregular rate & rhythm. No rubs, gallops or murmurs.  Lungs: Diminished throughout  Abdomen: soft, tender,  nondistended. No hepatosplenomegaly. No bruits or masses. Good bowel sounds. Extremities: no cyanosis, clubbing, rash. 1+ pedal edema.  Neuro: alert & orientedx3, cranial nerves grossly intact. moves all 4 extremities w/o difficulty. Affect pleasant  Telemetry: V paced, A fib.   Labs: Basic Metabolic Panel:  Recent Labs Lab 12/11/16 0243 12/12/16 0347 12/13/16 0325 12/14/16 0408 12/17/16 0429  NA 134* 133* 133* 133* 132*  K 4.2 4.4 4.5 4.6 6.1*  CL 100* 97* 99* 98* 99*  CO2 24 25 24 26 27   GLUCOSE 126* 107* 92 180* 186*  BUN 59* 62* 56* 53* 56*  CREATININE 1.81* 1.65* 1.33* 1.24* 1.57*  CALCIUM 8.4* 8.4* 8.4* 8.4* 8.4*    Liver Function Tests: No results for input(s): AST, ALT, ALKPHOS, BILITOT, PROT, ALBUMIN in the last 168 hours. No results for input(s): LIPASE, AMYLASE in the last 168 hours. No results for input(s): AMMONIA in the last 168 hours.  CBC:  Recent Labs Lab 12/11/16 0243 12/15/16 0337  WBC 11.5* 11.1*  HGB 8.1* 8.3*  HCT 26.6* 26.7*  MCV 88.4 89.9  PLT 371 432*    Cardiac Enzymes: No results for input(s): CKTOTAL, CKMB, CKMBINDEX, TROPONINI in the last 168 hours.  BNP: BNP (last 3 results)  Recent Labs  11/27/16 0159  BNP 920.0*    ProBNP (last 3 results) No results for input(s): PROBNP in the last 8760 hours.   CBG:  Recent Labs Lab 12/16/16 0615 12/16/16 1122 12/16/16 1630 12/16/16 2053 12/17/16 0627  GLUCAP 180* 172* 130* 112* 166*    Coagulation Studies: No results for input(s): LABPROT, INR in the last 72 hours.  Other results: EKG: Afib with RVR - 12/06/16.   Transthoracic Echocardiography 11/29/16 Study Conclusions  - Left ventricle: The cavity size was mildly dilated. Systolic   function was moderately reduced. The estimated ejection fraction   was in the range of 35% to 40%. There is akinesis of the basal   and mid inferior, inferolateral walls and apical inferior wall.   Features are consistent with a  pseudonormal left ventricular   filling pattern, with concomitant abnormal relaxation and   increased filling pressure (grade 2 diastolic dysfunction).   Doppler parameters are consistent with elevated ventricular   end-diastolic filling pressure. - Aortic valve: Trileaflet; normal thickness leaflets. There was no   regurgitation. - Aortic root: The aortic root was normal in size. - Mitral valve: There was moderate regurgitation. - Left atrium: The atrium was severely dilated. - Right atrium: The atrium was moderately dilated. Pacer wire or   catheter noted in right atrium. - Tricuspid valve: There was severe regurgitation. - Pulmonary arteries: Systolic pressure was severely increased. PA   peak pressure: 87 mm Hg (S). - Inferior vena cava: The vessel was dilated. The respirophasic   diameter changes were blunted (< 50%), consistent with elevated   central venous pressure. - Pericardium, extracardiac: There was no pericardial effusion.  Impressions:  - Since the prior study on 09/20/2015 LVEF still has moderate   systolic dysfunction at 92-33%.   There is new severe tricuspid regurgitation and severe pulmonary   hypertension with RVSP 87 mmHg  Medications:     Current Medications: . aspirin EC  81 mg Oral Daily  . atorvastatin  40 mg Oral q1800  . feeding supplement (ENSURE ENLIVE)  237 mL Oral BID BM  . folic acid  1 mg Oral Daily  . glipiZIDE  5 mg Oral Q breakfast  .  insulin aspart  0-15 Units Subcutaneous TID WC  . mouth rinse  15 mL Mouth Rinse BID  . metoprolol tartrate  25 mg Oral BID  . phenazopyridine  100 mg Oral TID WC  . polyethylene glycol  17 g Oral Daily  . sodium chloride flush  3 mL Intravenous Q12H  . sodium polystyrene  30 g Oral Once  . sulfamethoxazole-trimethoprim  1 tablet Oral Q12H     Infusions: . sodium chloride        Assessment/Plan   1. Acute on chronic combined systolic and diastolic CHF: EF 78-46% prior to CABG, severe TR,  pulmonary HTN.  - Volume overloaded on exam with JVD and lower extremity edema.  - With soft BP's and worsening renal function, concerned for low output.  - Patient has PICC, will check Co ox now.   - 80mg  IV Lasix now.  - Stop metoprolol.  - Will check CMET  - Has ICD 2. CAD s/p CABG, left atrial appendage clipping, MAZE on 12/04/16.  - Continue ASA - Denies chest pain.  - Needs anticoagulation with Xarelto but this is on hold for hematuria.  3. PAF: Developed post op Afib, but with history of Afib and prior DCCV in 2017.   - Would consider restarting Amiodarone. She was on it when she saw Dr. Lovena Le in march 2017, but stopped it on her own because she felt like it was making her nauseous.  - Rate controlled - Xarelto on hold for hematuria.  4. Hyperkalemia - got 30gm of Kayexalate today.  - continue to monitor.  5. Acute renal insufficieny - Creatinine 1.33->1.24->1.57 - BUN 56 - Follow with BMET 6. UTI - On Bactrim  Length of Stay: [place], NP  12/17/2016, 10:36 AM Arbutus Leas, NP-C Advanced Heart Failure Team  Pager [phone] M-F 7am-4pm.  Please contact [facility] for night-coverage after hours (4p -7a ) and weekends on [email or web address]  Patient seen with NP, agree with the above note.    To me, she looks volume overloaded on exam.  She does have severe TR also.  She is in atrial fibrillation with mild RVR around 100 bpm.  She remains very nauseated.  1. Acute on chronic systolic CHF: Pre-op echo with EF 35-40%, ischemic cardiomyopathy.  I think that she is volume overloaded on exam.  BUN/creatinine are really fairly stable.  - Will give Lasix 80 mg IV bid starting now, follow creatinine closely.  - She has a PICC, will send co-ox.  - Send BNP and will get a CXR.  - I am going to get a post-op echo.  Pre-op she had severe TR, not repaired.  2. CAD: s/p CABG.  Stable.  3. Atrial fibrillation: Persistent.  She had Maze and LAA ligation with CABG.  She gets  nauseated with amiodarone and has not been able to take.  - Will start mexiletine 200 mg bid.  - Restart Xarelto when hematuria slows.   - Eventual TEE-guided DCCV.  4. CKD: stage 3.  Creatinine has been fairly stable, mildly up today but comparable to prior values.  Hyperkalemia noted today, she got Kayexalate.  Repeat BMET this afternoon.  5. UTI: With elevated K and cr, would use ceftriaxone rather than Bactrim to treat E coli UTI.  Hematuria may be related to UTI.   Loralie Champagne 12/17/2016 12:52 PM

## 2016-12-17 NOTE — Hospital Discharge Follow-Up (Signed)
TCC appointment at [facility] for 12/19/16 cancelled as plan is for discharge to SNF at this time

## 2016-12-17 NOTE — Progress Notes (Signed)
[address]       [place] [address]             [phone]      13 Days Post-Op Procedure(s) (LRB): CORONARY ARTERY BYPASS GRAFTING (CABG)x 4 WITH ENDOSCOPIC HARVESTING OF RIGHT SAPHENOUS VEIN (N/A) CLIPPING OF ATRIAL APPENDAGE USING ATRICURE 45 ATRICLIP (Left) INTRAOPERATIVE TRANSESOPHAGEAL ECHOCARDIOGRAM (N/A) MAZE PROCEDURE (N/A) Subjective: Feels poorly, nauseated  Objective: Vital signs in last 24 hours: Temp:  [97.5 F (36.4 C)-97.6 F (36.4 C)] 97.5 F (36.4 C) (05/16 0510) Pulse Rate:  [78-107] 78 (05/16 0510) Cardiac Rhythm: Atrial fibrillation;Ventricular paced (05/16 0700) Resp:  [18] 18 (05/16 0510) BP: (85-170)/(45-89) 94/45 (05/16 0510) SpO2:  [90 %-99 %] 90 % (05/16 0510) Weight:  [189 lb 2.5 oz (85.8 kg)] 189 lb 2.5 oz (85.8 kg) (05/16 0510)  Hemodynamic parameters for last 24 hours:    Intake/Output from previous day: 05/15 0701 - 05/16 0700 In: 10 [I.V.:10] Out: -  Intake/Output this shift: No intake/output data recorded.  General appearance: alert, cooperative and no distress Heart: irregularly irregular rhythm Lungs: clear to auscultation bilaterally Abdomen: soft, non-tender Extremities: minimal edema Wound: incis healing well  Lab Results:  Recent Labs  12/15/16 0337  WBC 11.1*  HGB 8.3*  HCT 26.7*  PLT 432*   BMET:  Recent Labs  12/17/16 0429  NA 132*  K 6.1*  CL 99*  CO2 27  GLUCOSE 186*  BUN 56*  CREATININE 1.57*  CALCIUM 8.4*    PT/INR: No results for input(s): LABPROT, INR in the last 72 hours. ABG    Component Value Date/Time   PHART 7.369 12/04/2016 2343   HCO3 24.0 12/04/2016 2343   TCO2 25 12/05/2016 1612   ACIDBASEDEF 1.0 12/04/2016 2343   O2SAT 63.1 12/08/2016 0620   CBG (last 3)   Recent Labs  12/16/16 1630 12/16/16 2053 12/17/16 0627  GLUCAP 130* 112* 166*    Meds Scheduled Meds: . aspirin EC  81 mg Oral Daily  . atorvastatin  40 mg Oral q1800  . feeding supplement  (ENSURE ENLIVE)  237 mL Oral BID BM  . folic acid  1 mg Oral Daily  . furosemide  40 mg Oral Daily  . glipiZIDE  5 mg Oral Q breakfast  . insulin aspart  0-15 Units Subcutaneous TID WC  . mouth rinse  15 mL Mouth Rinse BID  . metoprolol tartrate  25 mg Oral BID  . phenazopyridine  100 mg Oral TID WC  . polyethylene glycol  17 g Oral Daily  . rivaroxaban  20 mg Oral Q supper  . sodium chloride flush  3 mL Intravenous Q12H  . spironolactone  12.5 mg Oral Daily  . sulfamethoxazole-trimethoprim  1 tablet Oral Q12H   Continuous Infusions: . sodium chloride     PRN Meds:.sodium chloride, acetaminophen, ALPRAZolam, ALPRAZolam, alum & mag hydroxide-simeth, bisacodyl **OR** bisacodyl, magnesium hydroxide, metoprolol tartrate, ondansetron (ZOFRAN) IV, oxyCODONE, sodium chloride flush, sodium chloride flush, traMADol  Xrays No results found.  Assessment/Plan: S/P Procedure(s) (LRB): CORONARY ARTERY BYPASS GRAFTING (CABG)x 4 WITH ENDOSCOPIC HARVESTING OF RIGHT SAPHENOUS VEIN (N/A) CLIPPING OF ATRIAL APPENDAGE USING ATRICURE 45 ATRICLIP (Left) INTRAOPERATIVE TRANSESOPHAGEAL ECHOCARDIOGRAM (N/A) MAZE PROCEDURE (N/A)  1 clinical dehydration with laboratory confirmation- intravasc dry- rising creat and BUN. Stop diuretics, give IVF- AHF consult to assist with management 2 hematuria secondary to ecoli cystitis/UTI- hold xaralto for now 3 Kayexalate for K+ -6.1 x I dose 4 CBG's- adeq control  5 afib rate mostly controlled 6 push rehab/pulm toilet - routine   LOS: 20 days    Meagan Spease E 12/17/2016

## 2016-12-17 NOTE — Progress Notes (Addendum)
Co ox 41%, repeat 37%. Will start milrinone 0.51mcg/kg/hr. Will repeat Co ox in one hour.  [name], NP  Agree with the above.  Nausea may be due to low output.  Will start milrinone, watch HR closely given atrial fibrillation.  If HR speeds up, can use amiodarone gtt for rate control.  She seems to have tolerated IV amiodarone earlier in hospitalization though she has not tolerated po amiodarone.   Loralie Champagne 12/17/2016 4:29 PM

## 2016-12-18 ENCOUNTER — Inpatient Hospital Stay (HOSPITAL_COMMUNITY): Payer: PPO

## 2016-12-18 DIAGNOSIS — I340 Nonrheumatic mitral (valve) insufficiency: Secondary | ICD-10-CM

## 2016-12-18 LAB — CBC
HCT: 26.2 % — ABNORMAL LOW (ref 36.0–46.0)
HEMATOCRIT: 23.4 % — AB (ref 36.0–46.0)
Hemoglobin: 7 g/dL — ABNORMAL LOW (ref 12.0–15.0)
Hemoglobin: 8.2 g/dL — ABNORMAL LOW (ref 12.0–15.0)
MCH: 26.6 pg (ref 26.0–34.0)
MCH: 27.2 pg (ref 26.0–34.0)
MCHC: 29.9 g/dL — AB (ref 30.0–36.0)
MCHC: 31.3 g/dL (ref 30.0–36.0)
MCV: 89 fL (ref 78.0–100.0)
MCV: 87 fL (ref 78.0–100.0)
PLATELETS: 517 10*3/uL — AB (ref 150–400)
PLATELETS: 421 10*3/uL — AB (ref 150–400)
RBC: 2.63 MIL/uL — ABNORMAL LOW (ref 3.87–5.11)
RBC: 3.01 MIL/uL — AB (ref 3.87–5.11)
RDW: 16.9 % — AB (ref 11.5–15.5)
RDW: 15.9 % — ABNORMAL HIGH (ref 11.5–15.5)
WBC: 13.6 10*3/uL — AB (ref 4.0–10.5)
WBC: 13.4 10*3/uL — ABNORMAL HIGH (ref 4.0–10.5)

## 2016-12-18 LAB — COOXEMETRY PANEL
Carboxyhemoglobin: 1.6 % — ABNORMAL HIGH (ref 0.5–1.5)
Carboxyhemoglobin: 1.5 % (ref 0.5–1.5)
Methemoglobin: 0.9 % (ref 0.0–1.5)
Methemoglobin: 1.3 % (ref 0.0–1.5)
O2 SAT: 66.7 %
O2 SAT: 77.4 %
TOTAL HEMOGLOBIN: 7.1 g/dL — AB (ref 12.0–16.0)
Total hemoglobin: 7.3 g/dL — ABNORMAL LOW (ref 12.0–16.0)

## 2016-12-18 LAB — BPAM RBC
BLOOD PRODUCT EXPIRATION DATE: 201805222359
Blood Product Expiration Date: 201805252359
ISSUE DATE / TIME: 201805100748
ISSUE DATE / TIME: 201805140733
UNIT TYPE AND RH: 600
Unit Type and Rh: 600

## 2016-12-18 LAB — TYPE AND SCREEN
ABO/RH(D): A NEG
Antibody Screen: NEGATIVE
UNIT DIVISION: 0
Unit division: 0

## 2016-12-18 LAB — BASIC METABOLIC PANEL
Anion gap: 11 (ref 5–15)
BUN: 55 mg/dL — AB (ref 6–20)
CHLORIDE: 95 mmol/L — AB (ref 101–111)
CO2: 28 mmol/L (ref 22–32)
CREATININE: 1.77 mg/dL — AB (ref 0.44–1.00)
Calcium: 8.4 mg/dL — ABNORMAL LOW (ref 8.9–10.3)
GFR calc non Af Amer: 28 mL/min — ABNORMAL LOW (ref >60)
GFR, EST AFRICAN AMERICAN: 33 mL/min — AB (ref >60)
Glucose, Bld: 205 mg/dL — ABNORMAL HIGH (ref 65–99)
Potassium: 4.9 mmol/L (ref 3.5–5.1)
SODIUM: 134 mmol/L — AB (ref 135–145)

## 2016-12-18 LAB — ECHOCARDIOGRAM COMPLETE
AVLVOTPG: 7 mmHg
E/e' ratio: 32.8
EWDT: 158 ms
FS: 8 % — AB (ref 28–44)
HEIGHTINCHES: 64 in
IVS/LV PW RATIO, ED: 1.52
LA ID, A-P, ES: 47 mm
LA diam index: 2.34 cm/m2
LA vol A4C: 90 ml
LA vol index: 48.2 mL/m2
LAVOL: 96.8 mL
LEFT ATRIUM END SYS DIAM: 47 mm
LV E/e' medial: 32.8
LV E/e'average: 32.8
LV PW d: 12.3 mm — AB (ref 0.6–1.1)
LV e' LATERAL: 5 cm/s
LVOT MV VTI INDEX: 0.77 cm2/m2
LVOT MV VTI: 1.54
LVOT SV: 50 mL
LVOT VTI: 19.5 cm
LVOT area: 2.54 cm2
LVOT diameter: 18 mm
LVOT peak vel: 130 cm/s
MV Annulus VTI: 32.2 cm
MV Dec: 158
MV M vel: 84.2
MV Peak grad: 11 mmHg
MVG: 4 mmHg
MVPKEVEL: 164 m/s
RV LATERAL S' VELOCITY: 10.7 cm/s
RV TAPSE: 9.14 mm
Reg peak vel: 231 cm/s
TDI e' lateral: 5
TDI e' medial: 6.86
TRMAXVEL: 231 cm/s
WEIGHTICAEL: 3047.64 [oz_av]

## 2016-12-18 LAB — LACTIC ACID, PLASMA: Lactic Acid, Venous: 0.7 mmol/L (ref 0.5–1.9)

## 2016-12-18 LAB — GLUCOSE, CAPILLARY
GLUCOSE-CAPILLARY: 196 mg/dL — AB (ref 65–99)
GLUCOSE-CAPILLARY: 128 mg/dL — AB (ref 65–99)
GLUCOSE-CAPILLARY: 120 mg/dL — AB (ref 65–99)
Glucose-Capillary: 233 mg/dL — ABNORMAL HIGH (ref 65–99)

## 2016-12-18 LAB — POCT I-STAT 3, VENOUS BLOOD GAS (G3P V)
ACID-BASE EXCESS: 3 mmol/L — AB (ref 0.0–2.0)
Bicarbonate: 27.6 mmol/L (ref 20.0–28.0)
O2 SAT: 59 %
TCO2: 29 mmol/L (ref 0–100)
pCO2, Ven: 42.3 mmHg — ABNORMAL LOW (ref 44.0–60.0)
pH, Ven: 7.423 (ref 7.250–7.430)
pO2, Ven: 30 mmHg — CL (ref 32.0–45.0)

## 2016-12-18 LAB — PREPARE RBC (CROSSMATCH)

## 2016-12-18 MED ORDER — SODIUM CHLORIDE 0.9 % IV SOLN
Freq: Once | INTRAVENOUS | Status: AC
  Administered 2016-12-18: 09:00:00 via INTRAVENOUS

## 2016-12-18 MED ORDER — HEPARIN (PORCINE) IN NACL 100-0.45 UNIT/ML-% IJ SOLN
750.0000 [IU]/h | INTRAMUSCULAR | Status: DC
  Administered 2016-12-18: 1000 [IU]/h via INTRAVENOUS
  Filled 2016-12-18: qty 250

## 2016-12-18 MED ORDER — FUROSEMIDE 10 MG/ML IJ SOLN
80.0000 mg | Freq: Once | INTRAMUSCULAR | Status: AC
  Administered 2016-12-18: 80 mg via INTRAVENOUS
  Filled 2016-12-18: qty 8

## 2016-12-18 MED ORDER — ALPRAZOLAM 0.25 MG PO TABS
0.2500 mg | ORAL_TABLET | Freq: Three times a day (TID) | ORAL | Status: DC | PRN
  Administered 2016-12-18 – 2017-01-07 (×5): 0.25 mg via ORAL
  Filled 2016-12-18 (×4): qty 1

## 2016-12-18 MED ORDER — CHLORHEXIDINE GLUCONATE CLOTH 2 % EX PADS
6.0000 | MEDICATED_PAD | Freq: Every day | CUTANEOUS | Status: DC
  Administered 2016-12-18 – 2017-01-03 (×13): 6 via TOPICAL

## 2016-12-18 NOTE — Progress Notes (Signed)
Patient ID: [name], female   DOB: [birth year], 71 y.o.   MRN: [id number]  SICU Evening Rounds  She is hemodynamically stable today on Milrinone 0.375 with Co-ox 77.4 this evening. She was weaned off Levophed earlier today but BP now 89 so will need to restart. CVP 12 sats 99%  Remain in rate controlled atrial fib 90's on IV amio.  Urinating but does not have foley.  Just finished 2nd unit of blood today.  2D echo today shows EF 40-45%, normal RV, no pericardial effusion.

## 2016-12-18 NOTE — Care Management Note (Signed)
Case Management Note Previous CM note initiated by Bethena Roys, RN 12/02/2016, 2:50 PM   Patient Details  Name: [name] MRN: [id number] Date of Birth: [birth year]  Subjective/Objective: Pt presented for chest pain - post cardiac cath 11-28-16 revealled 3 vessel CAD- Plan for CABG on Thursday. Pt is from home with daughter and grandson.             Action/Plan: CM will continue to monitor for disposition needs post d/c.   Expected Discharge Date:                  Expected Discharge Plan:  [place]  In-House Referral:     Discharge planning Services  CM Consult  Post Acute Care Choice:    Choice offered to:     DME Arranged:    DME Agency:     HH Arranged:    [place] Agency:     Status of Service:  In process, will continue to follow  If discussed at Long Length of Stay Meetings, dates discussed:  5/15, 5/17  Additional Comments:  12/18/16- 1020- Kayla Westhoff RN, CM- pt with decline yesterday tx back to ICU-2H for further care-with IV norepi and milrinone started. Also diuresed with IV lasix and IV amio for Afib. CM and CSW to continue to follow for d/c needs.   12/12/16- 1000- Kayla Cogswell RN, CM- pt tx from [facility] to 2W on 5/9- CSW following for SNF placement- pt with post op afib-   12/09/16- 1125-  [name], CM-  Pt s/p CABGx4 on 5/3-  Remains on 2H post op  Dawayne Patricia, RN 12/18/2016, 10:25 AM [phone]

## 2016-12-18 NOTE — Progress Notes (Signed)
Advanced Heart Failure Rounding Note   Subjective:    Yesterday was transferred to ICU due to cardiogenic shock --> placed on norepi and milrinone. Initial CO-OX 34%. Norepi currently at 3 mcg and milrinone 0.375 mcg with CO-OX 67%.. Also diuresed with IV lasix. Started IV amio for AF. On po amio she was nauseated. Suspect nausea related low output.   CO-OX 67% Creatinine 1.59>1.77 Hgb 8.3>7   Feeling better. Complaining of fatigue.   .  Objective:   Weight Range:  Vital Signs:   Temp:  [97.6 F (36.4 C)-98 F (36.7 C)] 97.7 F (36.5 C) (05/17 0400) Pulse Rate:  [96-145] 115 (05/17 0715) Resp:  [12-29] 21 (05/17 0715) BP: (78-208)/(44-192) 136/76 (05/17 0715) SpO2:  [91 %-100 %] 97 % (05/17 0715) Weight:  [190 lb 7.6 oz (86.4 kg)] 190 lb 7.6 oz (86.4 kg) (05/17 0355) Last BM Date: 12/16/16  Weight change: Filed Weights   12/16/16 0415 12/17/16 0510 12/18/16 0355  Weight: 187 lb 14.4 oz (85.2 kg) 189 lb 2.5 oz (85.8 kg) 190 lb 7.6 oz (86.4 kg)    Intake/Output:   Intake/Output Summary (Last 24 hours) at 12/18/16 0734 Last data filed at 12/18/16 0700  Gross per 24 hour  Intake           942.96 ml  Output              220 ml  Net           722.96 ml     Physical Exam: CVP 10  General:  Appears acutely ill. Pale. No resp difficulty HEENT: normal Neck: supple. JVP 10-11 Carotids 2+ bilat; no bruits. No lymphadenopathy or thryomegaly appreciated. Cor: PMI nondisplaced. Irregular rate & rhythm. No rubs, gallops or murmurs. Lungs: decreased in the bases on 80% NRB Abdomen: soft, nontender, nondistended. No hepatosplenomegaly. No bruits or masses. Good bowel sounds. Extremities: no cyanosis, clubbing, rash, trace R and LLE edema. RUE PICC Neuro: alert & orientedx3, cranial nerves grossly intact. moves all 4 extremities w/o difficulty. Affect flat  Telemetry: a fib 100s   Labs: Basic Metabolic Panel:  Recent Labs Lab 12/14/16 0408 12/17/16 0429  12/17/16 1211 12/17/16 1255 12/18/16 0328  NA 133* 132* 135 135 134*  K 4.6 6.1* 5.7* 5.7* 4.9  CL 98* 99* 100* 99* 95*  CO2 26 27 27 27 28   GLUCOSE 180* 186* 159* 166* 205*  BUN 53* 56* 56* 56* 55*  CREATININE 1.24* 1.57* 1.61* 1.59* 1.77*  CALCIUM 8.4* 8.4* 8.7* 8.7* 8.4*    Liver Function Tests:  Recent Labs Lab 12/17/16 1211  AST 23  ALT 29  ALKPHOS 120  BILITOT 0.6  PROT 6.6  ALBUMIN 3.1*   No results for input(s): LIPASE, AMYLASE in the last 168 hours. No results for input(s): AMMONIA in the last 168 hours.  CBC:  Recent Labs Lab 12/15/16 0337 12/18/16 0328  WBC 11.1* 13.6*  HGB 8.3* 7.0*  HCT 26.7* 23.4*  MCV 89.9 89.0  PLT 432* 517*    Cardiac Enzymes: No results for input(s): CKTOTAL, CKMB, CKMBINDEX, TROPONINI in the last 168 hours.  BNP: BNP (last 3 results)  Recent Labs  11/27/16 0159 12/17/16 1255  BNP 920.0* 1,062.6*    ProBNP (last 3 results) No results for input(s): PROBNP in the last 8760 hours.    Other results:  Imaging: Dg Chest Port 1 View  Result Date: 12/17/2016 CLINICAL DATA:  Shortness of breath today. No chest pain. History of valvular  heart disease, coronary artery disease, atrial fibrillation. The patient underwent Maze procedure and CABG on Dec 04, 2016 EXAM: PORTABLE CHEST 1 VIEW COMPARISON:  PA and lateral chest x-ray of Dec 11, 2016 FINDINGS: The lungs are adequately inflated. The interstitial markings are increased diffusely. The cardiac silhouette is enlarged. The left hemidiaphragm is obscured in the retrocardiac region is dense. The pulmonary vascularity is engorged and indistinct. The ICD is in stable position as is the left atrial appendage clip. The right-sided PICC line tip projects over the midportion of the SVC. IMPRESSION: CHF with pulmonary interstitial edema and left pleural effusion. Left lower lobe atelectasis or pneumonia. These findings have developed since the previous study. Electronically Signed   By:  David  Martinique M.D.   On: 12/17/2016 14:01      Medications:     Scheduled Medications: . aspirin EC  81 mg Oral Daily  . atorvastatin  40 mg Oral q1800  . feeding supplement (ENSURE ENLIVE)  237 mL Oral BID BM  . folic acid  1 mg Oral Daily  . furosemide  80 mg Intravenous BID  . glipiZIDE  5 mg Oral Q breakfast  . insulin aspart  0-15 Units Subcutaneous TID WC  . mouth rinse  15 mL Mouth Rinse BID  . polyethylene glycol  17 g Oral Daily     Infusions: . amiodarone 30 mg/hr (12/18/16 0700)  . cefTRIAXone (ROCEPHIN)  IV Stopped (12/17/16 1456)  . milrinone 0.375 mcg/kg/min (12/18/16 0700)  . norepinephrine (LEVOPHED) Adult infusion 3 mcg/min (12/18/16 0700)     PRN Medications:  acetaminophen, ALPRAZolam, ALPRAZolam, alum & mag hydroxide-simeth, bisacodyl **OR** bisacodyl, magnesium hydroxide, ondansetron (ZOFRAN) IV, oxyCODONE, sodium chloride flush, traMADol   Assessment/Plan/Discussion     1. Cardiogenic Shock Now milrinone 0.375 mcg + norepi 3 mcg. Off norepi SBP down to 80s.  Todays CO-OX 67%. CVP 10. Continue IV lasix.  ECHO today.  2. CAD S/P CABG L Atrial Appendage Clipp Maze 12/04/2016 On statin and aspirin 3.AF- on IV amio. Off anticoagulants to due to hematuria. Place SCDs.  4. Hematuria- Hgb 8.3>7.0 . Will need 1UPRBC 5. AKI- creatinine trending up 1.5>1.7 6. UTI- On bactrim 5/14 through 5/16. Switched to rocephin 5/16 . Urine CX + EColi  7. ID: WBC trending up. As above urine cx + . Also check blood cultures.    Length of Stay: [employer] NP-C  12/18/2016, 7:34 AM  Advanced Heart Failure Team Pager [phone] (M-F; 7a - 4p)  Please contact [facility] for night-coverage after hours (4p -7a ) and weekends on [email or web address]  Agree.   Developed shock last night. Mostly cardiogenic with very low co-ox but may also have had a component of septic shock.   BP and co-ox now stabilized on dual pressors. Will continue current regiment.   Remains in AF  with RVR. On amio. Will continue. Off anticoagulation due to hematuria. Need to start heparin soon.   Hgb down. Will give 2u PRBCs.   Had e.coli UTI. Continue ceftriaxone. Check Bcx.   On exam Currently wearing FM but sats improved JVP 10 Cor IRR IRR tachy Ab soft NT/ND good BS Ext pale warmer mild edema  Remains tenuous but starting to improve. Echo ordered. Continue pressors for now.   CRITICAL CARE Performed by: Glori Bickers  Total critical care time: 35 minutes  Critical care time was exclusive of separately billable procedures and treating other patients.  Critical care was necessary to treat or prevent imminent or  life-threatening deterioration.  Critical care was time spent personally by me (independent of midlevel providers or residents) on the following activities: development of treatment plan with patient and/or surrogate as well as nursing, discussions with consultants, evaluation of patient's response to treatment, examination of patient, obtaining history from patient or surrogate, ordering and performing treatments and interventions, ordering and review of laboratory studies, ordering and review of radiographic studies, pulse oximetry and re-evaluation of patient's condition.  Glori Bickers, MD  8:20 AM

## 2016-12-18 NOTE — Progress Notes (Signed)
  Echocardiogram 2D Echocardiogram has been performed.  Darlina Sicilian M 12/18/2016, 1:48 PM

## 2016-12-18 NOTE — Progress Notes (Signed)
ANTICOAGULATION CONSULT NOTE - Initial Consult  Pharmacy Consult for heparin Indication: atrial fibrillation  Allergies  Allergen Reactions  . No Known Allergies     Patient Measurements: Height: 5\' 4"  (162.6 cm) Weight: 190 lb 7.6 oz (86.4 kg) IBW/kg (Calculated) : 54.7 Heparin Dosing Weight: 73.5 kg  Assessment: 71 yo F presents on 4/26 following syncopal episode. Has PMH of CAD, PAF, DM, and CHF. Now s/p CABG on 5/4. Anticoag was on hold for hematuria. Now heart failure to restart heparin gtt today. Will shoot for lower goal with ongoing hematuria. Hgb down to 7.0 today, plts elevated at 517. Transfused 2 units of PRBCs today.  Goal of Therapy:  Heparin level 0.3-0.5 units/mL  Plan:  Start heparin gtt at 1,000 units/hr Check 6 hr heparin level Monitor daily heparin level, CBC, hematuria  Elenor Quinones, PharmD, [facility] Clinical Pharmacist Pager [phone] 12/18/2016 4:43 PM

## 2016-12-18 NOTE — Progress Notes (Signed)
OT Cancellation Note  Patient Details Name: [name] MRN: [id number] DOB: Jan 25, 1946   Cancelled Treatment:    Reason Eval/Treat Not Completed: Medical issues which prohibited therapy. Pt transferred to ICU with cardiogenic shock 5/16. Currently with hgb on 7 and on NRB. Will follow.  Malka So 12/18/2016, 11:32 AM  [phone]

## 2016-12-18 NOTE — Progress Notes (Signed)
14 Days Post-Op Procedure(s) (LRB): CORONARY ARTERY BYPASS GRAFTING (CABG)x 4 WITH ENDOSCOPIC HARVESTING OF RIGHT SAPHENOUS VEIN (N/A) CLIPPING OF ATRIAL APPENDAGE USING ATRICURE 45 ATRICLIP (Left) INTRAOPERATIVE TRANSESOPHAGEAL ECHOCARDIOGRAM (N/A) MAZE PROCEDURE (N/A) Subjective: Nausea better now although she did throw up earlier today  Objective: Vital signs in last 24 hours: Temp:  [97.3 F (36.3 C)-98 F (36.7 C)] 97.6 F (36.4 C) (05/17 1200) Pulse Rate:  [96-145] 114 (05/17 1200) Cardiac Rhythm: Atrial fibrillation (05/17 1200) Resp:  [12-29] 16 (05/17 1200) BP: (78-208)/(44-192) 108/65 (05/17 1200) SpO2:  [91 %-100 %] 100 % (05/17 1200) Weight:  [190 lb 7.6 oz (86.4 kg)] 190 lb 7.6 oz (86.4 kg) (05/17 0355)  Hemodynamic parameters for last 24 hours: CVP:  [13 mmHg-18 mmHg] 14 mmHg  Intake/Output from previous day: 05/16 0701 - 05/17 0700 In: 943 [P.O.:420; I.V.:523] Out: 220 [Emesis/NG output:120; Blood:100] Intake/Output this shift: Total I/O In: 172.8 [I.V.:142.8; Blood:30] Out: -   General appearance: alert, cooperative and no distress Neurologic: intact Heart: irregularly irregular rhythm Lungs: clear anteriorly, decreased at left base Abdomen: normal findings: soft, non-tender Wound: clean and dry  Lab Results:  Recent Labs  12/18/16 0328  WBC 13.6*  HGB 7.0*  HCT 23.4*  PLT 517*   BMET:  Recent Labs  12/17/16 1255 12/18/16 0328  NA 135 134*  K 5.7* 4.9  CL 99* 95*  CO2 27 28  GLUCOSE 166* 205*  BUN 56* 55*  CREATININE 1.59* 1.77*  CALCIUM 8.7* 8.4*    PT/INR: No results for input(s): LABPROT, INR in the last 72 hours. ABG    Component Value Date/Time   PHART 7.369 12/04/2016 2343   HCO3 27.6 12/17/2016 1752   TCO2 29 12/17/2016 1752   ACIDBASEDEF 1.0 12/04/2016 2343   O2SAT 66.7 12/18/2016 0353   CBG (last 3)   Recent Labs  12/17/16 1711 12/17/16 2149 12/18/16 0736  GLUCAP 135* 166* 233*    Assessment/Plan: S/P  Procedure(s) (LRB): CORONARY ARTERY BYPASS GRAFTING (CABG)x 4 WITH ENDOSCOPIC HARVESTING OF RIGHT SAPHENOUS VEIN (N/A) CLIPPING OF ATRIAL APPENDAGE USING ATRICURE 45 ATRICLIP (Left) INTRAOPERATIVE TRANSESOPHAGEAL ECHOCARDIOGRAM (N/A) MAZE PROCEDURE (N/A) -Appreciate Avanced Heart failure team's assistance Transferred to 2 heart last night. On milrinone and levophed. Co-ox greatly improved Nausea has not completely resolved Now on ceftriaxone for UTI, still has hematuria Anticoagulation on hold for hematuria- she has a left atrial clip in place so embolic risk relatively low- would not resume Xarelto until hematuria completely resolved Agree with plan to transfuse 2 units PRBC for symptomatic anemia   LOS: 21 days    Melrose Nakayama 12/18/2016

## 2016-12-19 ENCOUNTER — Inpatient Hospital Stay (HOSPITAL_COMMUNITY): Payer: PPO

## 2016-12-19 ENCOUNTER — Other Ambulatory Visit (HOSPITAL_COMMUNITY): Payer: PPO

## 2016-12-19 ENCOUNTER — Inpatient Hospital Stay: Payer: PPO | Admitting: Family Medicine

## 2016-12-19 DIAGNOSIS — I5021 Acute systolic (congestive) heart failure: Secondary | ICD-10-CM

## 2016-12-19 DIAGNOSIS — R310 Gross hematuria: Secondary | ICD-10-CM

## 2016-12-19 LAB — BASIC METABOLIC PANEL
ANION GAP: 11 (ref 5–15)
BUN: 47 mg/dL — ABNORMAL HIGH (ref 6–20)
CALCIUM: 8.2 mg/dL — AB (ref 8.9–10.3)
CO2: 29 mmol/L (ref 22–32)
Chloride: 93 mmol/L — ABNORMAL LOW (ref 101–111)
Creatinine, Ser: 1.69 mg/dL — ABNORMAL HIGH (ref 0.44–1.00)
GFR, EST AFRICAN AMERICAN: 35 mL/min — AB (ref >60)
GFR, EST NON AFRICAN AMERICAN: 30 mL/min — AB (ref >60)
GLUCOSE: 185 mg/dL — AB (ref 65–99)
Potassium: 4.1 mmol/L (ref 3.5–5.1)
Sodium: 133 mmol/L — ABNORMAL LOW (ref 135–145)

## 2016-12-19 LAB — CBC
HCT: 25.5 % — ABNORMAL LOW (ref 36.0–46.0)
Hemoglobin: 7.9 g/dL — ABNORMAL LOW (ref 12.0–15.0)
MCH: 27 pg (ref 26.0–34.0)
MCHC: 31 g/dL (ref 30.0–36.0)
MCV: 87 fL (ref 78.0–100.0)
PLATELETS: 406 10*3/uL — AB (ref 150–400)
RBC: 2.93 MIL/uL — AB (ref 3.87–5.11)
RDW: 16.3 % — ABNORMAL HIGH (ref 11.5–15.5)
WBC: 13.8 10*3/uL — ABNORMAL HIGH (ref 4.0–10.5)

## 2016-12-19 LAB — COOXEMETRY PANEL
CARBOXYHEMOGLOBIN: 1.6 % — AB (ref 0.5–1.5)
Methemoglobin: 1.2 % (ref 0.0–1.5)
O2 SAT: 58.8 %
TOTAL HEMOGLOBIN: 11.4 g/dL — AB (ref 12.0–16.0)

## 2016-12-19 LAB — HEPARIN LEVEL (UNFRACTIONATED)

## 2016-12-19 LAB — GLUCOSE, CAPILLARY
GLUCOSE-CAPILLARY: 147 mg/dL — AB (ref 65–99)
GLUCOSE-CAPILLARY: 169 mg/dL — AB (ref 65–99)
Glucose-Capillary: 184 mg/dL — ABNORMAL HIGH (ref 65–99)
Glucose-Capillary: 168 mg/dL — ABNORMAL HIGH (ref 65–99)

## 2016-12-19 LAB — MAGNESIUM: Magnesium: 2.7 mg/dL — ABNORMAL HIGH (ref 1.7–2.4)

## 2016-12-19 LAB — APTT: aPTT: 122 seconds — ABNORMAL HIGH (ref 24–36)

## 2016-12-19 MED ORDER — FUROSEMIDE 10 MG/ML IJ SOLN
80.0000 mg | Freq: Two times a day (BID) | INTRAMUSCULAR | Status: DC
  Administered 2016-12-19 – 2016-12-20 (×2): 80 mg via INTRAVENOUS
  Filled 2016-12-19 (×2): qty 8

## 2016-12-19 MED ORDER — FUROSEMIDE 10 MG/ML IJ SOLN
80.0000 mg | Freq: Two times a day (BID) | INTRAMUSCULAR | Status: AC
  Administered 2016-12-19: 80 mg via INTRAVENOUS
  Filled 2016-12-19: qty 8

## 2016-12-19 NOTE — Progress Notes (Signed)
12/19/2016 1500 RN noted pt. Back in NSR 70-75. VS stable. Will continue to closely monitor patient.  Suliman Termini, Arville Lime

## 2016-12-19 NOTE — Progress Notes (Signed)
Physical Therapy Treatment Patient Details Name: [name] MRN: [id number] DOB: 1945-11-15 Today's Date: 12/19/2016    History of Present Illness 71 yo s/p NSTEMI with CABG 5/3. PMhx: CAD, CHF, VT, orthostatic hypotension, ICD.  Transferred to ICU on due to cardiogenic shock on 5/16 with lower HGBs.  Pt also noticed to have clots in her urine, so heparin stopped and CT of abdomen ordered which did not show any acute processes in the abdomen, but did show bil pleural effusions.      PT Comments    Limited mobility today in the ICU as this is the first time she has been up since her transfer here two days ago.  She tolerated the transition well with some lightheadedness that remained, but did not get severe and stable vitals.  She continues to remain appropriate for SNF level rehab at discharge. PT will continue to follow acutely to progress mobility as able.   Follow Up Recommendations  SNF;Supervision/Assistance - 24 hour     Equipment Recommendations  None recommended by PT    Recommendations for Other Services   NA     Precautions / Restrictions Precautions Precautions: Fall;Sternal    Mobility  Bed Mobility   Bed Mobility: Rolling;Sidelying to Sit Rolling: Min assist Sidelying to sit: Min assist;HOB elevated       General bed mobility comments: Min assist to help pt roll with minimal use of arms to pull due to sternal precautions, same for transitioning from side to sitting.  Min assist overall to support trunk during transitions.   Transfers Overall transfer level: Needs assistance Equipment used: 1 person hand held assist Transfers: Sit to/from [facility] Sit to Stand: Min assist;+2 safety/equipment Stand pivot transfers: Min assist;+2 safety/equipment       General transfer comment: Min assist to stand and pivot to the recliner chair.  RN and PT both were in agreeance that as long as her vitals were stable and her BP was stable that this  would be a good first step to her mobility since her transfer here.  Second person was helpful in managing lines.   Ambulation/Gait             General Gait Details: NT yet          Balance Overall balance assessment: Needs assistance Sitting-balance support: Feet supported;Bilateral upper extremity supported Sitting balance-Leahy Scale: Fair Sitting balance - Comments: Pt reported lightheadedness EOB that did not worsen with continued time EOB.  Sat EOB ~6 mins before transferring to chair to make sure BP was stable.  BP soft, but stable in 110s/60s and stayed close to that once reclined in the chair without significant drop.     Standing balance support: Single extremity supported Standing balance-Leahy Scale: Poor Standing balance comment: needs assist in standing.                             Cognition Arousal/Alertness: Lethargic;Suspect due to medications Behavior During Therapy: [facility] for tasks assessed/performed Overall Cognitive Status: Within Functional Limits for tasks assessed                                               Pertinent Vitals/Pain Pain Assessment: Faces Faces Pain Scale: Hurts little more Pain Location: generalized Pain Intervention(s): Limited activity within patient's tolerance;Monitored during  session;Repositioned           PT Goals (current goals can now be found in the care plan section) Acute Rehab PT Goals Patient Stated Goal: return home after SNF for rehab Progress towards PT goals: Progressing toward goals    Frequency    Min 2X/week      PT Plan Current plan remains appropriate;Frequency needs to be updated       AM-PAC PT "6 Clicks" Daily Activity  Outcome Measure  Difficulty turning over in bed (including adjusting bedclothes, sheets and blankets)?: Total Difficulty moving from lying on back to sitting on the side of the bed? : Total Difficulty sitting down on and standing up from a chair  with arms (e.g., wheelchair, bedside commode, etc,.)?: Total Help needed moving to and from a bed to chair (including a wheelchair)?: A Little Help needed walking in hospital room?: A Little Help needed climbing 3-5 steps with a railing? : A Lot 6 Click Score: 11    End of Session Equipment Utilized During Treatment: Oxygen Activity Tolerance: Patient limited by fatigue Patient left: in chair;with call bell/phone within reach;with family/visitor present;with nursing/sitter in room Nurse Communication: Mobility status PT Visit Diagnosis: Muscle weakness (generalized) (M62.81);Difficulty in walking, not elsewhere classified (R26.2);Pain Pain - part of body:  (sternal/incisional)     Time: 9373-4287 PT Time Calculation (min) (ACUTE ONLY): 28 min  Charges:  $Therapeutic Activity: 23-37 mins          Marquie Aderhold B. [place], [place], DPT [phone]            12/19/2016, 3:54 PM

## 2016-12-19 NOTE — Progress Notes (Signed)
12/19/2016 Clarified with Dr. Roxan Hockey, ok to place 3 Way Foley catheter at this time due to large amount of blood clots and bloody urine. Verbal order received to flush with 1 L NS first and then flush prn as needed to keep urine clear. Foley placed and orders enacted. Will continue to closely monitor patient.  [name], Arville Lime

## 2016-12-19 NOTE — Progress Notes (Signed)
CT surgery p.m. Rounds  Patient comfortable sitting up in chair no complaint of bladder spasms Still with gross hematuria receiving bladder irrigation Currently sinus rhythm-anticoagulation has been stopped CT scan of abdomen shows no evidence of a bladder stone or tumor or renal tumor. Bladder is decompressed Continue antibiotics and bladder irrigation

## 2016-12-19 NOTE — Progress Notes (Signed)
Hochrein, MD called and notified regarding patient voiding blood. This recent void at 0000 had blood clots and patient on heparin as of 2000 12/18/2016. No new orders given at this time. Will continue to monitor patient.

## 2016-12-19 NOTE — Progress Notes (Signed)
ANTICOAGULATION CONSULT NOTE - Follow-up Consult  Pharmacy Consult for heparin Indication: atrial fibrillation  Allergies  Allergen Reactions  . No Known Allergies     Patient Measurements: Height: 5\' 4"  (162.6 cm) Weight: 190 lb 7.6 oz (86.4 kg) IBW/kg (Calculated) : 54.7 Heparin Dosing Weight: 73.5 kg  Assessment: 71 yo F presents on 4/26 following syncopal episode. Has PMH of CAD, PAF, DM, and CHF. Now s/p CABG on 5/4. Anticoag was on hold for hematuria. Last dose of Xarelto given 5/15 ~1750.  Heparin gtt restarted 5/17. Will shoot for lower goal with ongoing hematuria. Noted that pt continues with blood clots in urine.  Heparin level >2.2 (likely being affected by Xarelto) - will utilize PTT to monitor heparin until heparin level and PTT correlating. PTT elevated to 122 sec.  Goal of Therapy:  Heparin level 0.3-0.5 units/mL PTT 66 -82 sec Monitor platelets per protocol  Plan:  Decrease heparin to 750 units/hr Will f/u 8 hr PTT and heparin level Daily heparin level and PTT  Sherlon Handing, PharmD, BCPS Clinical pharmacist, pager [phone] 12/19/2016 1:40 AM

## 2016-12-19 NOTE — Progress Notes (Signed)
Advanced Heart Failure Rounding Note   Subjective:    Transferred to ICU due to cardiogenic shock --> placed on norepi and milrinone. Initial CO-OX 34%.   Co ox 59%, on Levo 92mcg + milrinone 0.338mcg.  Amio gtt started for afib. Creatinine 1.59->1.77->1.69. Weight up 3 pounds overnight?  Hematuria overnight. Echo with EF 40-45%, RV ok.   Says she feels more perky this morning, no nausea or vomiting. Denies SOB.   Objective:     Vital Signs:   Temp:  [96.5 F (35.8 C)-97.8 F (36.6 C)] 97.8 F (36.6 C) (05/18 0400) Pulse Rate:  [91-123] 104 (05/18 0700) Resp:  [12-29] 15 (05/18 0700) BP: (83-145)/(45-87) 98/52 (05/18 0700) SpO2:  [79 %-100 %] 96 % (05/18 0700) Weight:  [83 lb 3.2 oz (37.7 kg)] 83 lb 3.2 oz (37.7 kg) (05/18 0530) Last BM Date: 12/17/16  Weight change: Filed Weights   12/17/16 0510 12/18/16 0355 12/19/16 0530  Weight: 189 lb 2.5 oz (85.8 kg) 190 lb 7.6 oz (86.4 kg) 83 lb 3.2 oz (37.7 kg)    Intake/Output:   Intake/Output Summary (Last 24 hours) at 12/19/16 0741 Last data filed at 12/19/16 0700  Gross per 24 hour  Intake          2039.04 ml  Output                0 ml  Net          2039.04 ml     Physical Exam: CVP 10.  General:  Fatigued appearing female, NAD.  HEENT: normal Neck: supple. JVP to jaw. Carotids 2+ bilat; no bruits. No lymphadenopathy or thryomegaly appreciated. Cor: PMI nondisplaced. Irregular rate and rhythm. No rubs, gallops or murmurs. Lungs: Lungs clear in the upper lobes, clear bilaterally in bases.  Abdomen: soft, nontender, nondistended. No hepatosplenomegaly. No bruits or masses. + Bowel sounds.  Extremities: no cyanosis, clubbing, rash, Trace pedal edema.  Neuro: alert & orientedx3, cranial nerves grossly intact. moves all 4 extremities w/o difficulty. Affect pleasant.   Telemetry: a fib 100s   Labs: Basic Metabolic Panel:  Recent Labs Lab 12/17/16 0429 12/17/16 1211 12/17/16 1255 12/18/16 0328 12/19/16 0400   NA 132* 135 135 134* 133*  K 6.1* 5.7* 5.7* 4.9 4.1  CL 99* 100* 99* 95* 93*  CO2 27 27 27 28 29   GLUCOSE 186* 159* 166* 205* 185*  BUN 56* 56* 56* 55* 47*  CREATININE 1.57* 1.61* 1.59* 1.77* 1.69*  CALCIUM 8.4* 8.7* 8.7* 8.4* 8.2*  MG  --   --   --   --  2.7*    Liver Function Tests:  Recent Labs Lab 12/17/16 1211  AST 23  ALT 29  ALKPHOS 120  BILITOT 0.6  PROT 6.6  ALBUMIN 3.1*   No results for input(s): LIPASE, AMYLASE in the last 168 hours. No results for input(s): AMMONIA in the last 168 hours.  CBC:  Recent Labs Lab 12/15/16 0337 12/18/16 0328 12/18/16 2212 12/19/16 0400  WBC 11.1* 13.6* 13.4* 13.8*  HGB 8.3* 7.0* 8.2* 7.9*  HCT 26.7* 23.4* 26.2* 25.5*  MCV 89.9 89.0 87.0 87.0  PLT 432* 517* 421* 406*    Cardiac Enzymes: No results for input(s): CKTOTAL, CKMB, CKMBINDEX, TROPONINI in the last 168 hours.  BNP: BNP (last 3 results)  Recent Labs  11/27/16 0159 12/17/16 1255  BNP 920.0* 1,062.6*    ProBNP (last 3 results) No results for input(s): PROBNP in the last 8760 hours.  Other results:  Imaging: Dg Chest Port 1 View  Result Date: 12/17/2016 CLINICAL DATA:  Shortness of breath today. No chest pain. History of valvular heart disease, coronary artery disease, atrial fibrillation. The patient underwent Maze procedure and CABG on Dec 04, 2016 EXAM: PORTABLE CHEST 1 VIEW COMPARISON:  PA and lateral chest x-ray of Dec 11, 2016 FINDINGS: The lungs are adequately inflated. The interstitial markings are increased diffusely. The cardiac silhouette is enlarged. The left hemidiaphragm is obscured in the retrocardiac region is dense. The pulmonary vascularity is engorged and indistinct. The ICD is in stable position as is the left atrial appendage clip. The right-sided PICC line tip projects over the midportion of the SVC. IMPRESSION: CHF with pulmonary interstitial edema and left pleural effusion. Left lower lobe atelectasis or pneumonia. These findings  have developed since the previous study. Electronically Signed   By: David  Martinique M.D.   On: 12/17/2016 14:01     Medications:     Scheduled Medications: . aspirin EC  81 mg Oral Daily  . atorvastatin  40 mg Oral q1800  . Chlorhexidine Gluconate Cloth  6 each Topical Daily  . feeding supplement (ENSURE ENLIVE)  237 mL Oral BID BM  . folic acid  1 mg Oral Daily  . furosemide  80 mg Intravenous BID  . glipiZIDE  5 mg Oral Q breakfast  . insulin aspart  0-15 Units Subcutaneous TID WC  . mouth rinse  15 mL Mouth Rinse BID  . polyethylene glycol  17 g Oral Daily    Infusions: . amiodarone 30 mg/hr (12/19/16 0506)  . cefTRIAXone (ROCEPHIN)  IV Stopped (12/18/16 1542)  . heparin 750 Units/hr (12/19/16 0301)  . milrinone 0.375 mcg/kg/min (12/19/16 0506)  . norepinephrine (LEVOPHED) Adult infusion 1 mcg/min (12/19/16 0142)    PRN Medications: acetaminophen, ALPRAZolam, ALPRAZolam, alum & mag hydroxide-simeth, bisacodyl **OR** bisacodyl, magnesium hydroxide, ondansetron (ZOFRAN) IV, oxyCODONE, sodium chloride flush, traMADol   Assessment/Plan/Discussion     1. Cardiogenic Shock - Co ox 59 today, CVP 10.  - Continue milrinone 0.375+Levo 1 mcg. BP stable. Can wean off Levo.  - Continue Lasix 80mg  BID.  2. 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  - Surgery following.  3. AF  - on IV amio.  - Heparin started yesterday, had hematuria with clots overnight. Would stop IV heparin. 4. Hematuria - As above, would stop heparin with low embolic risk.  5. AKI: - creatinine elevated at 1.69, stable.  6. UTI: -  On bactrim 5/14 through 5/16. Switched to rocephin 5/16 . Urine CX + EColi  - Blood cultures pending.  - Afebrile.   Length of Stay: [place], NP-C Advanced Heart Failure Team  Pager [phone] M-F 7am-4pm.  Please contact [facility] for night-coverage after hours (4p -7a ) and weekends on  [email or web address]   Patient seen and examined with [name], NP. We discussed all aspects of the encounter. I agree with the assessment and plan as stated above.   HF improving. Can stop norepi. Will continue milrinone. Begin wean tomorrow if co-ox suitable.   Remains in AF but rate controlled on amio. Unable to DC-CV with lack of AC  Has worsening hematuria on heparin. Will stop heparin for now. Get CT ab to further evaluate.   D/w Jesusita Oka, MD  12:55 PM

## 2016-12-19 NOTE — Progress Notes (Signed)
      [address]       ,[place] [address]             [phone]      C/o pain with passing blood clots from bladder this AM Nausea better  BP 133/81   Pulse (!) 124   Temp 97.7 F (36.5 C) (Oral)   Resp (!) 23   Ht 5\' 4"  (1.626 m)   Wt 193 lb 9 oz (87.8 kg)   SpO2 100%   BMI 33.23 kg/m    Intake/Output Summary (Last 24 hours) at 12/19/16 1026 Last data filed at 12/19/16 0900  Gross per 24 hour  Intake          2022.49 ml  Output                0 ml  Net          2022.49 ml   Co-ox= 58 Creatinine down slightly to 1.69 Hct 25 WBC still elevated at 13.8  On exam anxious, no distress Cardiac irregular Lungs decreased at bases Wounds clean and dry Abdomen benign Ext well perfused  Looks better overall Renal function stabilized  Hematuria persists and now passing clots- will place Foley and irrigate to clear clots To have CT abdomen today On ceftriaxone for E coli UTI  [name], MD [facility] [phone]

## 2016-12-20 LAB — BASIC METABOLIC PANEL
Anion gap: 9 (ref 5–15)
BUN: 45 mg/dL — AB (ref 6–20)
CO2: 30 mmol/L (ref 22–32)
Calcium: 8.4 mg/dL — ABNORMAL LOW (ref 8.9–10.3)
Chloride: 94 mmol/L — ABNORMAL LOW (ref 101–111)
Creatinine, Ser: 1.71 mg/dL — ABNORMAL HIGH (ref 0.44–1.00)
GFR calc Af Amer: 34 mL/min — ABNORMAL LOW (ref >60)
GFR calc non Af Amer: 29 mL/min — ABNORMAL LOW (ref >60)
Glucose, Bld: 131 mg/dL — ABNORMAL HIGH (ref 65–99)
POTASSIUM: 4 mmol/L (ref 3.5–5.1)
SODIUM: 133 mmol/L — AB (ref 135–145)

## 2016-12-20 LAB — GLUCOSE, CAPILLARY
GLUCOSE-CAPILLARY: 176 mg/dL — AB (ref 65–99)
GLUCOSE-CAPILLARY: 139 mg/dL — AB (ref 65–99)
Glucose-Capillary: 133 mg/dL — ABNORMAL HIGH (ref 65–99)
Glucose-Capillary: 124 mg/dL — ABNORMAL HIGH (ref 65–99)

## 2016-12-20 LAB — CBC
HEMATOCRIT: 25.7 % — AB (ref 36.0–46.0)
Hemoglobin: 8 g/dL — ABNORMAL LOW (ref 12.0–15.0)
MCH: 27.2 pg (ref 26.0–34.0)
MCHC: 31.1 g/dL (ref 30.0–36.0)
MCV: 87.4 fL (ref 78.0–100.0)
Platelets: 363 10*3/uL (ref 150–400)
RBC: 2.94 MIL/uL — ABNORMAL LOW (ref 3.87–5.11)
RDW: 16.4 % — AB (ref 11.5–15.5)
WBC: 10 10*3/uL (ref 4.0–10.5)

## 2016-12-20 LAB — COOXEMETRY PANEL
Carboxyhemoglobin: 1.4 % (ref 0.5–1.5)
METHEMOGLOBIN: 0.9 % (ref 0.0–1.5)
O2 Saturation: 51.8 %
TOTAL HEMOGLOBIN: 8.2 g/dL — AB (ref 12.0–16.0)

## 2016-12-20 MED ORDER — HYDRALAZINE HCL 25 MG PO TABS
12.5000 mg | ORAL_TABLET | Freq: Three times a day (TID) | ORAL | Status: DC
  Administered 2016-12-20 – 2016-12-23 (×8): 12.5 mg via ORAL
  Filled 2016-12-20 (×4): qty 1
  Filled 2016-12-20: qty 0.5
  Filled 2016-12-20 (×3): qty 1

## 2016-12-20 MED ORDER — FUROSEMIDE 10 MG/ML IJ SOLN
80.0000 mg | Freq: Every day | INTRAMUSCULAR | Status: DC
  Administered 2016-12-21: 80 mg via INTRAVENOUS
  Filled 2016-12-20: qty 8

## 2016-12-20 MED ORDER — SORBITOL 70 % SOLN
30.0000 mL | Freq: Every morning | Status: AC
  Administered 2016-12-20 – 2016-12-22 (×2): 30 mL via ORAL
  Filled 2016-12-20 (×4): qty 30

## 2016-12-20 MED ORDER — AMIODARONE HCL 200 MG PO TABS
200.0000 mg | ORAL_TABLET | Freq: Two times a day (BID) | ORAL | Status: DC
  Administered 2016-12-20 – 2017-01-03 (×28): 200 mg via ORAL
  Filled 2016-12-20 (×29): qty 1

## 2016-12-20 MED ORDER — DEXTROSE 5 % IV SOLN
1.0000 g | INTRAVENOUS | Status: DC
  Administered 2016-12-20 – 2016-12-25 (×6): 1 g via INTRAVENOUS
  Filled 2016-12-20 (×6): qty 10

## 2016-12-20 NOTE — Progress Notes (Signed)
Patient is very anxious.  Offers multiple complaints / but most are non-descript.  Assistance x 2 to get up to chair.  States feels constipated and assisted to commode.  Disimpacted for medium amt. soft stool.  Given Sorbitol at this time, previously refused, and dulcolax supp per request.   Stated felt better in chair, but continues to state feels depressed and is weepy to relative on phone.  Amiodarone was discontinued as ordered earlier in shift and switched to po as patient is now in sinus rhythm.  Milrinone decreased as per MD order.

## 2016-12-20 NOTE — Progress Notes (Signed)
16 Days Post-Op Procedure(s) (LRB): CORONARY ARTERY BYPASS GRAFTING (CABG)x 4 WITH ENDOSCOPIC HARVESTING OF RIGHT SAPHENOUS VEIN (N/A) CLIPPING OF ATRIAL APPENDAGE USING ATRICURE 45 ATRICLIP (Left) INTRAOPERATIVE TRANSESOPHAGEAL ECHOCARDIOGRAM (N/A) MAZE PROCEDURE (N/A) Subjective: nsr Hematuria resolved Hold anticoagulation EF .45 by echo 5-17  Objective: Vital signs in last 24 hours: Temp:  [97.5 F (36.4 C)-97.9 F (36.6 C)] 97.8 F (36.6 C) (05/19 0924) Pulse Rate:  [69-107] 69 (05/19 0700) Cardiac Rhythm: Normal sinus rhythm (05/19 0400) Resp:  [12-22] 17 (05/19 0700) BP: (78-135)/(41-93) 126/56 (05/19 0700) SpO2:  [94 %-100 %] 96 % (05/19 0700) Weight:  [186 lb 11.7 oz (84.7 kg)] 186 lb 11.7 oz (84.7 kg) (05/19 0500)  Hemodynamic parameters for last 24 hours: CVP:  [11 mmHg-16 mmHg] 16 mmHg  Intake/Output from previous day: 05/18 0701 - 05/19 0700 In: 5041.4 [P.O.:1140; I.V.:651.4; IV Piggyback:50] Out: 7225 [Urine:5740] Intake/Output this shift: Total I/O In: 26.4 [I.V.:26.4] Out: 275 [Urine:275]  Incisions clean Lungs clear  Lab Results:  Recent Labs  12/19/16 0400 12/20/16 0506  WBC 13.8* 10.0  HGB 7.9* 8.0*  HCT 25.5* 25.7*  PLT 406* 363   BMET:  Recent Labs  12/19/16 0400 12/20/16 0506  NA 133* 133*  K 4.1 4.0  CL 93* 94*  CO2 29 30  GLUCOSE 185* 131*  BUN 47* 45*  CREATININE 1.69* 1.71*  CALCIUM 8.2* 8.4*    PT/INR: No results for input(s): LABPROT, INR in the last 72 hours. ABG    Component Value Date/Time   PHART 7.369 12/04/2016 2343   HCO3 27.6 12/17/2016 1752   TCO2 29 12/17/2016 1752   ACIDBASEDEF 1.0 12/04/2016 2343   O2SAT 51.8 12/20/2016 0519   CBG (last 3)   Recent Labs  12/19/16 1604 12/19/16 2210 12/20/16 0920  GLUCAP 169* 168* 133*    Assessment/Plan: S/P Procedure(s) (LRB): CORONARY ARTERY BYPASS GRAFTING (CABG)x 4 WITH ENDOSCOPIC HARVESTING OF RIGHT SAPHENOUS VEIN (N/A) CLIPPING OF ATRIAL APPENDAGE USING  ATRICURE 45 ATRICLIP (Left) INTRAOPERATIVE TRANSESOPHAGEAL ECHOCARDIOGRAM (N/A) MAZE PROCEDURE (N/A) Mobilize Diuresis DC foley in am   LOS: 23 days    Tharon Aquas Trigt III 12/20/2016

## 2016-12-20 NOTE — Progress Notes (Signed)
Advanced Heart Failure Rounding Note   Subjective:    Transferred to ICU due to cardiogenic shock --> placed on norepi and milrinone. Initial CO-OX 34%.  EF 40-45% by echo   Resting comfortably. Off heparin due to hematuria. CT ab reviewed personally and no stones or masses. Hematuria resolved.   Breathing better. Back in NSR. IV amio stopped by Dr. Lucianne Lei Tirgt. Milrinone cut back to 0.25  Co-ox 52% CVP 12-15 range. Denies orthopnea or PND    Objective:     Vital Signs:   Temp:  [97.5 F (36.4 C)-97.9 F (36.6 C)] 97.8 F (36.6 C) (05/19 0924) Pulse Rate:  [69-105] 69 (05/19 0700) Resp:  [12-20] 17 (05/19 0700) BP: (81-135)/(41-93) 126/56 (05/19 0700) SpO2:  [94 %-100 %] 96 % (05/19 0700) Weight:  [84.7 kg (186 lb 11.7 oz)] 84.7 kg (186 lb 11.7 oz) (05/19 0500) Last BM Date: 12/17/16  Weight change: Filed Weights   12/19/16 0530 12/19/16 0814 12/20/16 0500  Weight: 37.7 kg (83 lb 3.2 oz) 87.8 kg (193 lb 9 oz) 84.7 kg (186 lb 11.7 oz)    Intake/Output:   Intake/Output Summary (Last 24 hours) at 12/20/16 1145 Last data filed at 12/20/16 1045  Gross per 24 hour  Intake          5676.71 ml  Output             6940 ml  Net         -1263.29 ml     Physical Exam: CVP 12 General:  Lying flat in bed NAD HEENT: normal Neck: supple. JVP to jaw Carotids 2+ bilat; no bruits. No lymphadenopathy or thryomegaly appreciated. Cor: PMI nondisplaced.RRR no m/r/g Lungs: clear decreased in bases Abdomen: obese  Soft, NT/ND good BS Extremities: no cyanosis, clubbing, rash, trace edema Neuro: alert & orientedx3, cranial nerves grossly intact. moves all 4 extremities w/o difficulty. Affect pleasant.   Telemetry: NSR 80s Personally reviewed   Labs: Basic Metabolic Panel:  Recent Labs Lab 12/17/16 1211 12/17/16 1255 12/18/16 0328 12/19/16 0400 12/20/16 0506  NA 135 135 134* 133* 133*  K 5.7* 5.7* 4.9 4.1 4.0  CL 100* 99* 95* 93* 94*  CO2 27 27 28 29 30   GLUCOSE 159*  166* 205* 185* 131*  BUN 56* 56* 55* 47* 45*  CREATININE 1.61* 1.59* 1.77* 1.69* 1.71*  CALCIUM 8.7* 8.7* 8.4* 8.2* 8.4*  MG  --   --   --  2.7*  --     Liver Function Tests:  Recent Labs Lab 12/17/16 1211  AST 23  ALT 29  ALKPHOS 120  BILITOT 0.6  PROT 6.6  ALBUMIN 3.1*   No results for input(s): LIPASE, AMYLASE in the last 168 hours. No results for input(s): AMMONIA in the last 168 hours.  CBC:  Recent Labs Lab 12/15/16 0337 12/18/16 0328 12/18/16 2212 12/19/16 0400 12/20/16 0506  WBC 11.1* 13.6* 13.4* 13.8* 10.0  HGB 8.3* 7.0* 8.2* 7.9* 8.0*  HCT 26.7* 23.4* 26.2* 25.5* 25.7*  MCV 89.9 89.0 87.0 87.0 87.4  PLT 432* 517* 421* 406* 363    Cardiac Enzymes: No results for input(s): CKTOTAL, CKMB, CKMBINDEX, TROPONINI in the last 168 hours.  BNP: BNP (last 3 results)  Recent Labs  11/27/16 0159 12/17/16 1255  BNP 920.0* 1,062.6*    ProBNP (last 3 results) No results for input(s): PROBNP in the last 8760 hours.    Other results:  Imaging: Ct Abdomen Pelvis Wo Contrast  Addendum Date: 12/19/2016  ADDENDUM REPORT: 12/19/2016 14:58 ADDENDUM: Addendum for addition of IMPRESSION: High attenuation material within the urinary bladder raising the possibility of blood products. Recommend correlation with urinalysis. Electronically Signed   By: Lovey Newcomer M.D.   On: 12/19/2016 14:58   Result Date: 12/19/2016 CLINICAL DATA:  Patient status post heart surgery 15 days prior. Lower abdominal pain. Hematuria. EXAM: CT ABDOMEN AND PELVIS WITHOUT CONTRAST TECHNIQUE: Multidetector CT imaging of the abdomen and pelvis was performed following the standard protocol without IV contrast. COMPARISON:  CT abdomen pelvis 05/11/2015 FINDINGS: Lower chest: The heart is enlarged. Moderate bilateral pleural effusions. Consolidation of the left lower lobe in subpleural consolidation right lower lobe. Hepatobiliary: Liver is normal in size and contour. Gallbladder is unremarkable.  Pancreas: Unremarkable Spleen: Unremarkable Adrenals/Urinary Tract: The adrenal glands are normal. Kidneys are symmetric in size. No hydronephrosis. Urinary bladder is decompressed. Foley catheter is present within the urinary bladder lumen. Small amount of gas within the urinary bladder. Dependent high attenuation material within the urinary bladder. Stomach/Bowel: No abnormal bowel wall thickening or evidence for bowel obstruction. Normal appendix. No free fluid or free intraperitoneal air. Normal morphology of the stomach. Vascular/Lymphatic: Normal caliber abdominal aorta. No retroperitoneal lymphadenopathy. Reproductive: Status post hysterectomy. Adnexal structures are unremarkable. Other: None. Musculoskeletal: Lumbar spine degenerative changes. No aggressive or acute appearing osseous lesions. IMPRESSION: Moderate bilateral pleural effusions with underlying consolidation within the left greater than right lower lobes which may be secondary to atelectasis or infection. No acute process within the abdomen. Electronically Signed: By: Lovey Newcomer M.D. On: 12/19/2016 14:09     Medications:     Scheduled Medications: . atorvastatin  40 mg Oral q1800  . Chlorhexidine Gluconate Cloth  6 each Topical Daily  . feeding supplement (ENSURE ENLIVE)  237 mL Oral BID BM  . folic acid  1 mg Oral Daily  . [START ON 12/21/2016] furosemide  80 mg Intravenous Daily  . glipiZIDE  5 mg Oral Q breakfast  . insulin aspart  0-15 Units Subcutaneous TID WC  . mouth rinse  15 mL Mouth Rinse BID  . polyethylene glycol  17 g Oral Daily    Infusions: . cefTRIAXone (ROCEPHIN)  IV 1 g (12/20/16 1045)  . milrinone 0.25 mcg/kg/min (12/20/16 1110)    PRN Medications: acetaminophen, ALPRAZolam, ALPRAZolam, alum & mag hydroxide-simeth, bisacodyl **OR** bisacodyl, magnesium hydroxide, ondansetron (ZOFRAN) IV, oxyCODONE, sodium chloride flush, traMADol   Assessment/Plan/Discussion     1. Cardiogenic Shock - Now on  milrinone alone. Co-ox dropped to 52%. Would continue milrinone. May need to increase dose slightly  -Will add hydralazine for afterload reduction. No ACE/ARB with CKD. No b-blocker with low output. - Continue Lasix 80mg  BID. May need metoalzone  2. 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  - Surgery following.  3. PAF  - Now back in NSR. IV amio stopped by TCTS. Will start po amio 200 bid  - Off heparin due to hematuria 4. Hematuria - CT ab negative. Has Foley for irrigation. Is resolving  5. AKI: - creatinine elevated at 1.7. Remains stable  6. UTI: -  On bactrim 5/14 through 5/16. Switched to rocephin 5/16 . Urine CX + EColi  - Blood cultures NGTD 7. Deconditioning - Mobilize   Length of Stay: 23  Glori Bickers MD Advanced Heart Failure Team  Pager [phone] M-F 7am-4pm.  Please contact [facility] for night-coverage after hours (4p -7a ) and weekends on  [email or web address]

## 2016-12-21 ENCOUNTER — Inpatient Hospital Stay (HOSPITAL_COMMUNITY): Payer: PPO

## 2016-12-21 DIAGNOSIS — J90 Pleural effusion, not elsewhere classified: Secondary | ICD-10-CM

## 2016-12-21 LAB — CBC
HCT: 21.6 % — ABNORMAL LOW (ref 36.0–46.0)
HCT: 28.3 % — ABNORMAL LOW (ref 36.0–46.0)
Hemoglobin: 6.6 g/dL — CL (ref 12.0–15.0)
Hemoglobin: 8.6 g/dL — ABNORMAL LOW (ref 12.0–15.0)
MCH: 26.6 pg (ref 26.0–34.0)
MCH: 26.6 pg (ref 26.0–34.0)
MCHC: 30.6 g/dL (ref 30.0–36.0)
MCHC: 30.4 g/dL (ref 30.0–36.0)
MCV: 87.1 fL (ref 78.0–100.0)
MCV: 87.6 fL (ref 78.0–100.0)
PLATELETS: 279 10*3/uL (ref 150–400)
Platelets: 390 10*3/uL (ref 150–400)
RBC: 2.48 MIL/uL — AB (ref 3.87–5.11)
RBC: 3.23 MIL/uL — ABNORMAL LOW (ref 3.87–5.11)
RDW: 16 % — ABNORMAL HIGH (ref 11.5–15.5)
RDW: 15.9 % — ABNORMAL HIGH (ref 11.5–15.5)
WBC: 7.7 10*3/uL (ref 4.0–10.5)
WBC: 10.2 10*3/uL (ref 4.0–10.5)

## 2016-12-21 LAB — COOXEMETRY PANEL
Carboxyhemoglobin: 1.4 % (ref 0.5–1.5)
Carboxyhemoglobin: 1.8 % — ABNORMAL HIGH (ref 0.5–1.5)
METHEMOGLOBIN: 0.7 % (ref 0.0–1.5)
Methemoglobin: 0.8 % (ref 0.0–1.5)
O2 SAT: 47.1 %
O2 SAT: 63.4 %
TOTAL HEMOGLOBIN: 8 g/dL — AB (ref 12.0–16.0)
TOTAL HEMOGLOBIN: 8.2 g/dL — AB (ref 12.0–16.0)

## 2016-12-21 LAB — BASIC METABOLIC PANEL
ANION GAP: 7 (ref 5–15)
BUN: 34 mg/dL — ABNORMAL HIGH (ref 6–20)
CHLORIDE: 104 mmol/L (ref 101–111)
CO2: 25 mmol/L (ref 22–32)
Calcium: 6.6 mg/dL — ABNORMAL LOW (ref 8.9–10.3)
Creatinine, Ser: 1.12 mg/dL — ABNORMAL HIGH (ref 0.44–1.00)
GFR calc Af Amer: 57 mL/min — ABNORMAL LOW (ref >60)
GFR calc non Af Amer: 49 mL/min — ABNORMAL LOW (ref >60)
GLUCOSE: 94 mg/dL (ref 65–99)
Potassium: 3.1 mmol/L — ABNORMAL LOW (ref 3.5–5.1)
Sodium: 136 mmol/L (ref 135–145)

## 2016-12-21 LAB — GLUCOSE, CAPILLARY
GLUCOSE-CAPILLARY: 94 mg/dL (ref 65–99)
GLUCOSE-CAPILLARY: 132 mg/dL — AB (ref 65–99)
GLUCOSE-CAPILLARY: 86 mg/dL (ref 65–99)
Glucose-Capillary: 89 mg/dL (ref 65–99)

## 2016-12-21 LAB — PREPARE RBC (CROSSMATCH)

## 2016-12-21 LAB — HEMOGLOBIN AND HEMATOCRIT, BLOOD
HCT: 31.8 % — ABNORMAL LOW (ref 36.0–46.0)
Hemoglobin: 9.9 g/dL — ABNORMAL LOW (ref 12.0–15.0)

## 2016-12-21 MED ORDER — METOLAZONE 2.5 MG PO TABS
2.5000 mg | ORAL_TABLET | Freq: Two times a day (BID) | ORAL | Status: DC
  Administered 2016-12-21 – 2016-12-22 (×2): 2.5 mg via ORAL
  Filled 2016-12-21 (×2): qty 1

## 2016-12-21 MED ORDER — FUROSEMIDE 10 MG/ML IJ SOLN
80.0000 mg | Freq: Three times a day (TID) | INTRAMUSCULAR | Status: DC
  Administered 2016-12-21 – 2016-12-23 (×6): 80 mg via INTRAVENOUS
  Filled 2016-12-21 (×6): qty 8

## 2016-12-21 MED ORDER — POTASSIUM CHLORIDE CRYS ER 10 MEQ PO TBCR
40.0000 meq | EXTENDED_RELEASE_TABLET | Freq: Two times a day (BID) | ORAL | Status: DC
  Administered 2016-12-21 – 2016-12-22 (×4): 40 meq via ORAL
  Filled 2016-12-21 (×7): qty 4

## 2016-12-21 NOTE — Progress Notes (Signed)
Advanced Heart Failure Rounding Note   Subjective:    Transferred to ICU due to cardiogenic shock --> placed on norepi and milrinone. Initial CO-OX 34%.  EF 40-45% by echo   Off heparin due to hematuria. CT ab reviewed personally and no stones or masses.   Last night BP dropped with hydralazine. Milrinone left at 0.375. Passing clots from bladder into Foley. Dysuria. Feels weak. No CP or SOB.   Back in NSR. Now on po amio 200 bid.   Co-ox 63% CVP 12-15 range. CXR with worsening edema. Moderate left effusion (reviewed personally).  Hgb 6.6. TCTS has written for blood.    Objective:     Vital Signs:   Temp:  [93.6 F (34.2 C)-97.5 F (36.4 C)] 97.5 F (36.4 C) (05/20 0700) Pulse Rate:  [59-78] 78 (05/20 0700) Resp:  [13-25] 18 (05/20 0700) BP: (93-140)/(48-73) 93/71 (05/20 0700) SpO2:  [92 %-98 %] 92 % (05/20 0700) Last BM Date: 12/20/16  Weight change: Filed Weights   12/19/16 0530 12/19/16 0814 12/20/16 0500  Weight: 37.7 kg (83 lb 3.2 oz) 87.8 kg (193 lb 9 oz) 84.7 kg (186 lb 11.7 oz)    Intake/Output:   Intake/Output Summary (Last 24 hours) at 12/21/16 1050 Last data filed at 12/21/16 0700  Gross per 24 hour  Intake          7691.73 ml  Output             8200 ml  Net          -508.27 ml     Physical Exam: CVP 15-16 General:  Sitting in chair weak. pale HEENT: normal anicrteric Neck: supple. JVP to jaw Carotids 2+ bilat; no bruits. No lymphadenopathy or thryomegaly appreciated. Cor: PMI nondisplaced RRR no m/r/g  Lungs: crackles right bases dull halfway up on left  Abdomen: obese  Soft, NT/ND good BS Extremities: no cyanosis, clubbing, rash, 1+  Edema pale Neuro: alert & orientedx3, cranial nerves grossly intact. moves all 4 extremities w/o difficulty. Affect flat   Telemetry: NSR 70-80s. Personally reviewed   Labs: Basic Metabolic Panel:  Recent Labs Lab 12/17/16 1255 12/18/16 0328 12/19/16 0400 12/20/16 0506 12/21/16 0513  NA 135 134*  133* 133* 136  K 5.7* 4.9 4.1 4.0 3.1*  CL 99* 95* 93* 94* 104  CO2 27 28 29 30 25   GLUCOSE 166* 205* 185* 131* 94  BUN 56* 55* 47* 45* 34*  CREATININE 1.59* 1.77* 1.69* 1.71* 1.12*  CALCIUM 8.7* 8.4* 8.2* 8.4* 6.6*  MG  --   --  2.7*  --   --     Liver Function Tests:  Recent Labs Lab 12/17/16 1211  AST 23  ALT 29  ALKPHOS 120  BILITOT 0.6  PROT 6.6  ALBUMIN 3.1*   No results for input(s): LIPASE, AMYLASE in the last 168 hours. No results for input(s): AMMONIA in the last 168 hours.  CBC:  Recent Labs Lab 12/18/16 0328 12/18/16 2212 12/19/16 0400 12/20/16 0506 12/21/16 0513  WBC 13.6* 13.4* 13.8* 10.0 7.7  HGB 7.0* 8.2* 7.9* 8.0* 6.6*  HCT 23.4* 26.2* 25.5* 25.7* 21.6*  MCV 89.0 87.0 87.0 87.4 87.1  PLT 517* 421* 406* 363 279    Cardiac Enzymes: No results for input(s): CKTOTAL, CKMB, CKMBINDEX, TROPONINI in the last 168 hours.  BNP: BNP (last 3 results)  Recent Labs  11/27/16 0159 12/17/16 1255  BNP 920.0* 1,062.6*    ProBNP (last 3 results) No results for input(s):  PROBNP in the last 8760 hours.    Other results:  Imaging: Ct Abdomen Pelvis Wo Contrast  Addendum Date: 12/19/2016   ADDENDUM REPORT: 12/19/2016 14:58 ADDENDUM: Addendum for addition of IMPRESSION: High attenuation material within the urinary bladder raising the possibility of blood products. Recommend correlation with urinalysis. Electronically Signed   By: Lovey Newcomer M.D.   On: 12/19/2016 14:58   Result Date: 12/19/2016 CLINICAL DATA:  Patient status post heart surgery 15 days prior. Lower abdominal pain. Hematuria. EXAM: CT ABDOMEN AND PELVIS WITHOUT CONTRAST TECHNIQUE: Multidetector CT imaging of the abdomen and pelvis was performed following the standard protocol without IV contrast. COMPARISON:  CT abdomen pelvis 05/11/2015 FINDINGS: Lower chest: The heart is enlarged. Moderate bilateral pleural effusions. Consolidation of the left lower lobe in subpleural consolidation right  lower lobe. Hepatobiliary: Liver is normal in size and contour. Gallbladder is unremarkable. Pancreas: Unremarkable Spleen: Unremarkable Adrenals/Urinary Tract: The adrenal glands are normal. Kidneys are symmetric in size. No hydronephrosis. Urinary bladder is decompressed. Foley catheter is present within the urinary bladder lumen. Small amount of gas within the urinary bladder. Dependent high attenuation material within the urinary bladder. Stomach/Bowel: No abnormal bowel wall thickening or evidence for bowel obstruction. Normal appendix. No free fluid or free intraperitoneal air. Normal morphology of the stomach. Vascular/Lymphatic: Normal caliber abdominal aorta. No retroperitoneal lymphadenopathy. Reproductive: Status post hysterectomy. Adnexal structures are unremarkable. Other: None. Musculoskeletal: Lumbar spine degenerative changes. No aggressive or acute appearing osseous lesions. IMPRESSION: Moderate bilateral pleural effusions with underlying consolidation within the left greater than right lower lobes which may be secondary to atelectasis or infection. No acute process within the abdomen. Electronically Signed: By: Lovey Newcomer M.D. On: 12/19/2016 14:09   Dg Chest Port 1 View  Result Date: 12/21/2016 CLINICAL DATA:  CABG EXAM: PORTABLE CHEST 1 VIEW COMPARISON:  12/17/2016 FINDINGS: Prior CABG. Left pacer remains in place, unchanged. Cardiomegaly with large left pleural effusion, increasing since prior study. Bilateral airspace opacities likely reflect edema. IMPRESSION: Enlarging left effusion. Diffuse bilateral airspace disease, likely edema/CHF. Electronically Signed   By: Rolm Baptise M.D.   On: 12/21/2016 07:17     Medications:     Scheduled Medications: . amiodarone  200 mg Oral BID  . atorvastatin  40 mg Oral q1800  . Chlorhexidine Gluconate Cloth  6 each Topical Daily  . feeding supplement (ENSURE ENLIVE)  237 mL Oral BID BM  . folic acid  1 mg Oral Daily  . furosemide  80 mg  Intravenous Daily  . glipiZIDE  5 mg Oral Q breakfast  . hydrALAZINE  12.5 mg Oral Q8H  . insulin aspart  0-15 Units Subcutaneous TID WC  . mouth rinse  15 mL Mouth Rinse BID  . polyethylene glycol  17 g Oral Daily  . sorbitol  30 mL Oral q morning - 10a    Infusions: . cefTRIAXone (ROCEPHIN)  IV Stopped (12/21/16 0920)  . milrinone 0.377 mcg/kg/min (12/21/16 0700)    PRN Medications: acetaminophen, ALPRAZolam, ALPRAZolam, alum & mag hydroxide-simeth, bisacodyl **OR** bisacodyl, magnesium hydroxide, ondansetron (ZOFRAN) IV, oxyCODONE, sodium chloride flush, traMADol   Assessment/Plan/Discussion     1. Acute on chronic systolic HF -> Cardiogenic Shock - Co-ox 63% on milrinone 0.375 will continue - Volume status remains markedly elevated. CXR with worsening infiltrates and left effusion (viewed personally) - Will increase IV lasix and added metoalzone - Hydralazine added for afterload reduction but BP dropped - will stop  . No ACE/ARB with CKD. No b-blocker with  low output. 2.  Left pleural effusion - Moderate to large. Hopefully improve with diuresis. Suspect she may need thora with IR in am. Discussed with Dr. Prescott Gum 3. 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  - Surgery following.  3. PAF  - Now back in NSR. Onpo amio 200 bid  - Off heparin due to hematuria 4. Hematuria - CT ab negative. Has Foley for irrigation. Getting blood today 5. AKI: - Improved with hemodynamic support. Creatinine 1.1 today.  6. UTI: -  On bactrim 5/14 through 5/16. Switched to rocephin 5/16 . Urine CX + EColi  - Blood cultures NGTD - Wil add back pyridium  7. Deconditioning - Mobilize 8. Symptomatic anemia - Transfuse today   Length of Stay: [place] MD Advanced Heart Failure Team  Pager [phone] M-F 7am-4pm.  Please contact [facility] for night-coverage after hours (4p -7a ) and weekends on [email or web address]

## 2016-12-21 NOTE — Progress Notes (Signed)
17 Days Post-Op Procedure(s) (LRB): CORONARY ARTERY BYPASS GRAFTING (CABG)x 4 WITH ENDOSCOPIC HARVESTING OF RIGHT SAPHENOUS VEIN (N/A) CLIPPING OF ATRIAL APPENDAGE USING ATRICURE 45 ATRICLIP (Left) INTRAOPERATIVE TRANSESOPHAGEAL ECHOCARDIOGRAM (N/A) MAZE PROCEDURE (N/A) Subjective: Clots from bladder Hb 6.6 nsr Did not walk yet cxr with mod L effusion Objective: Vital signs in last 24 hours: Temp:  [93.6 F (34.2 C)-97.5 F (36.4 C)] 97.5 F (36.4 C) (05/20 0700) Pulse Rate:  [59-78] 78 (05/20 0700) Cardiac Rhythm: Normal sinus rhythm (05/20 0400) Resp:  [13-25] 18 (05/20 0700) BP: (93-140)/(48-73) 93/71 (05/20 0700) SpO2:  [92 %-98 %] 92 % (05/20 0700)  Hemodynamic parameters for last 24 hours:  afeb  Intake/Output from previous day: 05/19 0701 - 05/20 0700 In: 9130.4 [P.O.:860; I.V.:230.4; IV Piggyback:50] Out: 9400 [Urine:9400] Intake/Output this shift: No intake/output data recorded.       Exam    General- alert and comfortable, sternotomy clean   Lungs- clear without rales, wheezes   Cor- regular rate and rhythm, no murmur , gallop   Abdomen- soft, non-tender   Extremities - warm, non-tender, minimal edema   Neuro- oriented, appropriate, no focal weakness   Lab Results:  Recent Labs  12/20/16 0506 12/21/16 0513  WBC 10.0 7.7  HGB 8.0* 6.6*  HCT 25.7* 21.6*  PLT 363 279   BMET:  Recent Labs  12/20/16 0506 12/21/16 0513  NA 133* 136  K 4.0 3.1*  CL 94* 104  CO2 30 25  GLUCOSE 131* 94  BUN 45* 34*  CREATININE 1.71* 1.12*  CALCIUM 8.4* 6.6*    PT/INR: No results for input(s): LABPROT, INR in the last 72 hours. ABG    Component Value Date/Time   PHART 7.369 12/04/2016 2343   HCO3 27.6 12/17/2016 1752   TCO2 29 12/17/2016 1752   ACIDBASEDEF 1.0 12/04/2016 2343   O2SAT 63.4 12/21/2016 0516   CBG (last 3)   Recent Labs  12/20/16 1627 12/20/16 2143 12/21/16 0827  GLUCAP 176* 139* 94    Assessment/Plan: S/P Procedure(s)  (LRB): CORONARY ARTERY BYPASS GRAFTING (CABG)x 4 WITH ENDOSCOPIC HARVESTING OF RIGHT SAPHENOUS VEIN (N/A) CLIPPING OF ATRIAL APPENDAGE USING ATRICURE 45 ATRICLIP (Left) INTRAOPERATIVE TRANSESOPHAGEAL ECHOCARDIOGRAM (N/A) MAZE PROCEDURE (N/A) Transfuse, diuresis Cont milrinone check PAL in am for possible thotacentesis   LOS: 24 days    [name] 12/21/2016

## 2016-12-21 NOTE — Progress Notes (Signed)
Unable to obtain oral/axillary temp on patient.  Rectal temp refused.  Patient pale and cool to the touch, VS stable.  MD notified, CO-OX 47.1%.  Bearhugger (warming blanket) applied.    Later, patient requested bear hugger be removed.  Agreed to rectal temp, 93.6 F.  Pt educated and bear hugger remains on.  Will continue to monitor closely.  Mosie Lukes, RN

## 2016-12-21 NOTE — Plan of Care (Signed)
Called by the bedside nurse to discuss [name] who became slightly hypotensive after receiving her scheduled dose of hydralazine, felt cool to the touch, and c/o generalized fatigue. Her milrinone was documented to be running at 0.41mcg/kg/min but [name]'s RN noted that the drip was actually still running at 0.364mcg/kg/min and so had not actually been decreased earlier in the day as reported.  Her VS are stable but co-ox now 47.1%.  I will continue the milrinone at 0.353mcg/kg/min now and continue to monitor closely overnight, with use of vasopressor support if needed.  Clayborne Dana MD

## 2016-12-21 NOTE — Progress Notes (Signed)
CT surgery p.m. Rounds  Patient had good day Ambulated in the hallway Maintaining sinus rhythm Excellent diuresis Received 1 unit of packed cells-blood clots from bladder have improved Continue avoidance of anticoagulation

## 2016-12-21 NOTE — Progress Notes (Signed)
2300: Patient irrigation Foley leaking. Refilled balloon. Catheter continuous to leak. Per Corotto, MD remove Foley.   2330: Removed Foley. Patient receiving 80 Lasix q8hours. Per Corotto, MD, place indwelling foley catheter. Will initiate orders and continue to monitor patient.

## 2016-12-21 NOTE — Progress Notes (Deleted)
Cardiology Office Note   Date:  12/21/2016   ID:  [name], DOB [birth year], MRN [id number]  PCP:  Arnoldo Morale, MD  Cardiologist:  ***    No chief complaint on file.     History of Present Illness: [name] is a 71 y.o. female who presents for ***    Past Medical History:  Diagnosis Date  . Abnormal PFT   . CAD (coronary artery disease), native coronary artery - 3 vessel 04/21/2015   a. NSTEMI 8-04/2015 felt 2/2 demand ischemia. b. 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  . Chronic diastolic CHF (congestive heart failure) ([place])   . Diabetes mellitus ([place])   . Diabetes mellitus without complication ([place])    dx on wed  . Hypertrophic cardiomyopathy ([place])   . Left atrial enlargement   . Mitral regurgitation    a. Echo 04/2015: moderate mitral regurgitation. b. F/u echo 05/2015: mild MR.  . NSTEMI (non-ST elevated myocardial infarction) ([place]) 11/27/2016  . Paroxysmal atrial fibrillation ([name])   . Shortness of breath dyspnea   . Tricuspid regurgitation    a. Echo 04/2015: Mod-severe TR. b. Not mentioned on echo 05/2015  . Ventricular tachycardia ([place])    a. s/p [employer]. Jude ICD implanted 04/2015.    Past Surgical History:  Procedure Laterality Date  . APPLICATION OF WOUND VAC Left 01/06/2013   Procedure: APPLICATION OF WOUND VAC X 2;  Surgeon: [name], MD;  Location: [facility];  Service: Orthopedics;  Laterality: Left;  left forearm  . APPLICATION OF WOUND VAC Left 01/09/2013   Procedure: APPLICATION OF WOUND VAC;  Surgeon: [name], MD;  Location: [facility];  Service: Orthopedics;  Laterality: Left;  . CARDIAC CATHETERIZATION N/A 04/19/2015   Procedure: Left Heart Cath and Coronary Angiography;  Surgeon: [name], MD;  Location: [place];  Service: Cardiovascular;  Laterality: N/A;  . CARDIOVERSION N/A 09/20/2015   Procedure: CARDIOVERSION;  Surgeon: Pixie Casino, MD;  Location: [facility]  ENDOSCOPY;  Service: Cardiovascular;  Laterality: N/A;  . CLIPPING OF ATRIAL APPENDAGE Left 12/04/2016   Procedure: CLIPPING OF ATRIAL APPENDAGE USING ATRICURE 63 ATRICLIP;  Surgeon: Melrose Nakayama, MD;  Location: [facility];  Service: Open Heart Surgery;  Laterality: Left;  . CORONARY ARTERY BYPASS GRAFT N/A 12/04/2016   Procedure: CORONARY ARTERY BYPASS GRAFTING (CABG)x 4 WITH ENDOSCOPIC HARVESTING OF RIGHT SAPHENOUS VEIN;  Surgeon: Melrose Nakayama, MD;  Location: [facility];  Service: Open Heart Surgery;  Laterality: N/A;  . DENTAL SURGERY  Oct. 2011   several extractions, bone graft  . EP IMPLANTABLE DEVICE N/A 04/16/2015   Procedure: ICD Implant;  Surgeon: [name], MD;  Location: [facility] CV LAB;  Service: Cardiovascular;  Laterality: N/A;  . I&D EXTREMITY Left 01/06/2013   Procedure: IRRIGATION AND DEBRIDEMENT LEFT ELBOW AND LEFT FOREARM ;  Surgeon: [name], MD;  Location: [facility];  Service: Orthopedics;  Laterality: Left;  . INCISION AND DRAINAGE Left 01/09/2013   Procedure: REDO INCISION AND DRAINAGE LEFT ELBOW;  Surgeon: [name], MD;  Location: [facility];  Service: Orthopedics;  Laterality: Left;  SUPINE, UPPER EXTERMITY DRAPE  . INTRAOPERATIVE TRANSESOPHAGEAL ECHOCARDIOGRAM N/A 12/04/2016   Procedure: INTRAOPERATIVE TRANSESOPHAGEAL ECHOCARDIOGRAM;  Surgeon: Melrose Nakayama, MD;  Location: [facility];  Service: Open Heart Surgery;  Laterality: N/A;  . LEFT HEART CATH AND CORONARY ANGIOGRAPHY N/A 11/28/2016   Procedure: Left  Heart Cath and Coronary Angiography;  Surgeon: Belva Crome, MD;  Location: [facility];  Service: Cardiovascular;  Laterality: N/A;  . MAZE N/A 12/04/2016   Procedure: MAZE PROCEDURE;  Surgeon: Melrose Nakayama, MD;  Location: [facility];  Service: Open Heart Surgery;  Laterality: N/A;  . SECONDARY CLOSURE OF WOUND Left 01/09/2013   Procedure: SECONDARY CLOSURE OF WOUND  LEFT ELBOW;  Surgeon: [name], MD;  Location: [facility];  Service: Orthopedics;   Laterality: Left;  . TEE WITHOUT CARDIOVERSION N/A 09/20/2015   Procedure: TRANSESOPHAGEAL ECHOCARDIOGRAM (TEE);  Surgeon: Pixie Casino, MD;  Location: [facility] ENDOSCOPY;  Service: Cardiovascular;  Laterality: N/A;     No current facility-administered medications for this visit.    No current outpatient prescriptions on file.   Facility-Administered Medications Ordered in Other Visits  Medication Dose Route Frequency Provider Last Rate Last Dose  . acetaminophen (TYLENOL) tablet 1,000 mg  1,000 mg Oral Q6H PRN Melrose Nakayama, MD   1,000 mg at 12/20/16 0814  . ALPRAZolam Duanne Moron) tablet 0.25 mg  0.25 mg Oral QHS PRN Nani Skillern, PA-C   0.25 mg at 12/21/16 2236  . ALPRAZolam Duanne Moron) tablet 0.25 mg  0.25 mg Oral TID PRN Bensimhon, Shaune Pascal, MD   0.25 mg at 12/20/16 1312  . alum & mag hydroxide-simeth (MAALOX/MYLANTA) 200-200-20 MG/5ML suspension 15 mL  15 mL Oral Q4H PRN Melrose Nakayama, MD      . amiodarone (PACERONE) tablet 200 mg  200 mg Oral BID Bensimhon, Shaune Pascal, MD   200 mg at 12/21/16 2203  . atorvastatin (LIPITOR) tablet 40 mg  40 mg Oral q1800 Melrose Nakayama, MD   40 mg at 12/21/16 1736  . bisacodyl (DULCOLAX) suppository 10 mg  10 mg Rectal Daily PRN Melrose Nakayama, MD       Or  . bisacodyl (DULCOLAX) EC tablet 10 mg  10 mg Oral Daily PRN Melrose Nakayama, MD      . cefTRIAXone (ROCEPHIN) 1 g in dextrose 5 % 50 mL IVPB  1 g Intravenous Q24H Ivin Poot, MD   Stopped at 12/21/16 0920  . Chlorhexidine Gluconate Cloth 2 % PADS 6 each  6 each Topical Daily Melrose Nakayama, MD   6 each at 12/21/16 1000  . feeding supplement (ENSURE ENLIVE) (ENSURE ENLIVE) liquid 237 mL  237 mL Oral BID BM Melrose Nakayama, MD   237 mL at 12/21/16 1333  . folic acid (FOLVITE) tablet 1 mg  1 mg Oral Daily Grace Isaac, MD   1 mg at 12/21/16 0851  . furosemide (LASIX) injection 80 mg  80 mg Intravenous Q8H Bensimhon, Shaune Pascal, MD   80 mg at  12/21/16 2203  . glipiZIDE (GLUCOTROL) tablet 5 mg  5 mg Oral Q breakfast Melrose Nakayama, MD   5 mg at 12/21/16 0756  . hydrALAZINE (APRESOLINE) tablet 12.5 mg  12.5 mg Oral Q8H Bensimhon, Shaune Pascal, MD   12.5 mg at 12/21/16 2203  . insulin aspart (novoLOG) injection 0-15 Units  0-15 Units Subcutaneous TID WC Grace Isaac, MD   2 Units at 12/21/16 1155  . magnesium hydroxide (MILK OF MAGNESIA) suspension 30 mL  30 mL Oral Daily PRN Melrose Nakayama, MD      . MEDLINE mouth rinse  15 mL Mouth Rinse BID Grace Isaac, MD   15 mL at 12/19/16 1000  . metolazone (ZAROXOLYN) tablet 2.5 mg  2.5  mg Oral BID Bensimhon, Shaune Pascal, MD   2.5 mg at 12/21/16 1736  . milrinone (PRIMACOR) 20 MG/100 ML (0.2 mg/mL) infusion  0.375 mcg/kg/min Intravenous Continuous Bensimhon, Shaune Pascal, MD 9.7 mL/hr at 12/21/16 2202 0.375 mcg/kg/min at 12/21/16 2202  . ondansetron (ZOFRAN) injection 4 mg  4 mg Intravenous Q6H PRN Jadene Pierini E, PA-C   4 mg at 12/19/16 2232  . oxyCODONE (Oxy IR/ROXICODONE) immediate release tablet 5-10 mg  5-10 mg Oral Q3H PRN Jadene Pierini E, PA-C   10 mg at 12/21/16 0805  . polyethylene glycol (MIRALAX / GLYCOLAX) packet 17 g  17 g Oral Daily Melrose Nakayama, MD   Stopped at 12/21/16 1000  . potassium chloride (K-DUR) CR tablet 40 mEq  40 mEq Oral BID Bensimhon, Shaune Pascal, MD   40 mEq at 12/21/16 2203  . sodium chloride flush (NS) 0.9 % injection 10-40 mL  10-40 mL Intracatheter PRN Melrose Nakayama, MD   3 mL at 12/20/16 0802  . sorbitol 70 % solution 30 mL  30 mL Oral q morning - 10a Ivin Poot, MD   Stopped at 12/21/16 1000  . traMADol (ULTRAM) tablet 50-100 mg  50-100 mg Oral Q4H PRN Gold, Wayne E, PA-C   50 mg at 12/19/16 1142    Allergies:   No known allergies    Social History:  The patient  reports that she quit smoking about 35 years ago. She quit after 4.00 years of use. She has never used smokeless tobacco. She reports that she does not drink alcohol or  use drugs.   Family History:  The patient's ***family history includes Breast cancer in her mother; Diabetes in her father; Healthy in her sister; Heart disease in her sister; Stroke in her father.    ROS:  General:no colds or fevers, no weight changes Skin:no rashes or ulcers HEENT:no blurred vision, no congestion CV:see HPI PUL:see HPI GI:no diarrhea constipation or melena, no indigestion GU:no hematuria, no dysuria MS:no joint pain, no claudication Neuro:no syncope, no lightheadedness Endo:no diabetes, no thyroid disease Wt Readings from Last 3 Encounters:  12/20/16 186 lb 11.7 oz (84.7 kg)  06/16/16 173 lb 9.6 oz (78.7 kg)  03/21/16 172 lb 6.4 oz (78.2 kg)     PHYSICAL EXAM: VS:  There were no vitals taken for this visit. , BMI There is no height or weight on file to calculate BMI. General:Pleasant affect, NAD Skin:Warm and dry, brisk capillary refill HEENT:normocephalic, sclera clear, mucus membranes moist Neck:supple, no JVD, no bruits  Heart:S1S2 RRR without murmur, gallup, rub or click Lungs:clear without rales, rhonchi, or wheezes XIH:WTUU, non tender, + BS, do not palpate liver spleen or masses Ext:no lower ext edema, 2+ pedal pulses, 2+ radial pulses Neuro:alert and oriented, MAE, follows commands, + facial symmetry    EKG:  EKG is ordered today. The ekg ordered today demonstrates ***   Recent Labs: 06/16/2016: TSH 4.14 12/17/2016: ALT 29; B Natriuretic Peptide 1,062.6 12/19/2016: Magnesium 2.7 12/21/2016: BUN 34; Creatinine, Ser 1.12; Hemoglobin 9.9; Platelets 390; Potassium 3.1; Sodium 136    Lipid Panel    Component Value Date/Time   CHOL 139 11/28/2016 0542   TRIG 88 11/28/2016 0542   HDL 33 (L) 11/28/2016 0542   CHOLHDL 4.2 11/28/2016 0542   VLDL 18 11/28/2016 0542   LDLCALC 88 11/28/2016 0542       Other studies Reviewed: Additional studies/ records that were reviewed today include: ***.   ASSESSMENT AND PLAN:  1.  ***  Current  medicines are reviewed with the patient today.  The patient Has no concerns regarding medicines.  The following changes have been made:  See above Labs/ tests ordered today include:see above  Disposition:   FU:  see above  Signed, Cecilie Kicks, NP  12/21/2016 10:40 PM    [facility] [place], [place], [place], [place] Phone: [phone]; Fax: [phone]

## 2016-12-22 ENCOUNTER — Inpatient Hospital Stay (HOSPITAL_COMMUNITY): Payer: PPO

## 2016-12-22 ENCOUNTER — Ambulatory Visit: Payer: PPO | Admitting: Cardiology

## 2016-12-22 ENCOUNTER — Encounter (HOSPITAL_COMMUNITY): Payer: Self-pay | Admitting: Student

## 2016-12-22 DIAGNOSIS — I480 Paroxysmal atrial fibrillation: Secondary | ICD-10-CM

## 2016-12-22 HISTORY — PX: IR THORACENTESIS ASP PLEURAL SPACE W/IMG GUIDE: IMG5380

## 2016-12-22 LAB — CBC
HEMATOCRIT: 28.9 % — AB (ref 36.0–46.0)
Hemoglobin: 9.1 g/dL — ABNORMAL LOW (ref 12.0–15.0)
MCH: 27.6 pg (ref 26.0–34.0)
MCHC: 31.5 g/dL (ref 30.0–36.0)
MCV: 87.6 fL (ref 78.0–100.0)
Platelets: 349 10*3/uL (ref 150–400)
RBC: 3.3 MIL/uL — ABNORMAL LOW (ref 3.87–5.11)
RDW: 16.1 % — ABNORMAL HIGH (ref 11.5–15.5)
WBC: 7.4 10*3/uL (ref 4.0–10.5)

## 2016-12-22 LAB — COOXEMETRY PANEL
Carboxyhemoglobin: 1.9 % — ABNORMAL HIGH (ref 0.5–1.5)
Methemoglobin: 0.8 % (ref 0.0–1.5)
O2 SAT: 65.8 %
Total hemoglobin: 9.3 g/dL — ABNORMAL LOW (ref 12.0–16.0)

## 2016-12-22 LAB — BPAM RBC
Blood Product Expiration Date: 201805222359
Blood Product Expiration Date: 201805252359
Blood Product Expiration Date: 201806072359
ISSUE DATE / TIME: 201805171142
ISSUE DATE / TIME: 201805171647
ISSUE DATE / TIME: 201805201227
Unit Type and Rh: 600
Unit Type and Rh: 600
Unit Type and Rh: 600

## 2016-12-22 LAB — BASIC METABOLIC PANEL
Anion gap: 8 (ref 5–15)
BUN: 37 mg/dL — ABNORMAL HIGH (ref 6–20)
CHLORIDE: 94 mmol/L — AB (ref 101–111)
CO2: 30 mmol/L (ref 22–32)
Calcium: 8.2 mg/dL — ABNORMAL LOW (ref 8.9–10.3)
Creatinine, Ser: 1.44 mg/dL — ABNORMAL HIGH (ref 0.44–1.00)
GFR calc Af Amer: 42 mL/min — ABNORMAL LOW (ref >60)
GFR, EST NON AFRICAN AMERICAN: 36 mL/min — AB (ref >60)
GLUCOSE: 83 mg/dL (ref 65–99)
POTASSIUM: 4.3 mmol/L (ref 3.5–5.1)
Sodium: 132 mmol/L — ABNORMAL LOW (ref 135–145)

## 2016-12-22 LAB — GLUCOSE, CAPILLARY
Glucose-Capillary: 88 mg/dL (ref 65–99)
Glucose-Capillary: 108 mg/dL — ABNORMAL HIGH (ref 65–99)
Glucose-Capillary: 180 mg/dL — ABNORMAL HIGH (ref 65–99)
Glucose-Capillary: 129 mg/dL — ABNORMAL HIGH (ref 65–99)

## 2016-12-22 LAB — TYPE AND SCREEN
ABO/RH(D): A NEG
ANTIBODY SCREEN: NEGATIVE
UNIT DIVISION: 0
UNIT DIVISION: 0
Unit division: 0

## 2016-12-22 MED ORDER — LIDOCAINE HCL 1 % IJ SOLN
INTRAMUSCULAR | Status: AC
  Filled 2016-12-22: qty 20

## 2016-12-22 NOTE — Progress Notes (Addendum)
TCTS DAILY ICU PROGRESS NOTE                   [address]            [employer] [id number]          [phone]   18 Days Post-Op Procedure(s) (LRB): CORONARY ARTERY BYPASS GRAFTING (CABG)x 4 WITH ENDOSCOPIC HARVESTING OF RIGHT SAPHENOUS VEIN (N/A) CLIPPING OF ATRIAL APPENDAGE USING ATRICURE 45 ATRICLIP (Left) INTRAOPERATIVE TRANSESOPHAGEAL ECHOCARDIOGRAM (N/A) MAZE PROCEDURE (N/A)  Total Length of Stay:  LOS: 25 days   Subjective: Patient states she is very tired this am.  Objective: Vital signs in last 24 hours: Temp:  [95.8 F (35.4 C)-97.2 F (36.2 C)] 95.8 F (35.4 C) (05/21 0800) Pulse Rate:  [66-82] 72 (05/21 0800) Cardiac Rhythm: Normal sinus rhythm (05/21 0800) Resp:  [12-26] 21 (05/21 0700) BP: (96-134)/(49-107) 106/52 (05/21 0800) SpO2:  [91 %-100 %] 92 % (05/21 0800) Weight:  [83.5 kg (184 lb 1.6 oz)] 83.5 kg (184 lb 1.6 oz) (05/21 0500)  Filed Weights   12/19/16 0814 12/20/16 0500 12/22/16 0500  Weight: 87.8 kg (193 lb 9 oz) 84.7 kg (186 lb 11.7 oz) 83.5 kg (184 lb 1.6 oz)    Weight change:       Intake/Output from previous day: 05/20 0701 - 05/21 0700 In: 9836.4 [P.O.:1200; I.V.:221.4; Blood:365; IV Piggyback:50] Out: 38182 [Urine:13125]  Intake/Output this shift: Total I/O In: 9.7 [I.V.:9.7] Out: 40 [Urine:40]  Current Meds: Scheduled Meds: . amiodarone  200 mg Oral BID  . atorvastatin  40 mg Oral q1800  . Chlorhexidine Gluconate Cloth  6 each Topical Daily  . feeding supplement (ENSURE ENLIVE)  237 mL Oral BID BM  . folic acid  1 mg Oral Daily  . furosemide  80 mg Intravenous Q8H  . glipiZIDE  5 mg Oral Q breakfast  . hydrALAZINE  12.5 mg Oral Q8H  . insulin aspart  0-15 Units Subcutaneous TID WC  . mouth rinse  15 mL Mouth Rinse BID  . metolazone  2.5 mg Oral BID  . polyethylene glycol  17 g Oral Daily  . potassium chloride  40 mEq Oral BID  . sorbitol  30 mL Oral q morning - 10a   Continuous Infusions: .  cefTRIAXone (ROCEPHIN)  IV Stopped (12/21/16 0920)  . milrinone 0.375 mcg/kg/min (12/22/16 0800)   PRN Meds:.acetaminophen, ALPRAZolam, ALPRAZolam, alum & mag hydroxide-simeth, bisacodyl **OR** bisacodyl, magnesium hydroxide, ondansetron (ZOFRAN) IV, oxyCODONE, sodium chloride flush, traMADol  General appearance: cooperative and no distress Neurologic: intact Heart: RRR Lungs: Very diminished on left and crackles Abdomen: Soft, non tender, obese Extremities: Bilateral +1 LE edema Wounds: Clean and dry.  Lab Results: CBC: Recent Labs  12/21/16 1048 12/21/16 2100 12/22/16 0423  WBC 10.2  --  7.4  HGB 8.6* 9.9* 9.1*  HCT 28.3* 31.8* 28.9*  PLT 390  --  349   BMET:  Recent Labs  12/21/16 0513 12/22/16 0423  NA 136 132*  K 3.1* 4.3  CL 104 94*  CO2 25 30  GLUCOSE 94 83  BUN 34* 37*  CREATININE 1.12* 1.44*  CALCIUM 6.6* 8.2*    CMET: Lab Results  Component Value Date   WBC 7.4 12/22/2016   HGB 9.1 (L) 12/22/2016   HCT 28.9 (L) 12/22/2016   PLT 349 12/22/2016   GLUCOSE 83 12/22/2016   CHOL 139 11/28/2016   TRIG 88 11/28/2016   HDL 33 (L) 11/28/2016   LDLCALC 88 11/28/2016  ALT 29 12/17/2016   AST 23 12/17/2016   NA 132 (L) 12/22/2016   K 4.3 12/22/2016   CL 94 (L) 12/22/2016   CREATININE 1.44 (H) 12/22/2016   BUN 37 (H) 12/22/2016   CO2 30 12/22/2016   TSH 4.14 06/16/2016   INR 1.34 12/04/2016   HGBA1C 7.0 (H) 12/06/2016    PT/INR: No results for input(s): LABPROT, INR in the last 72 hours. Radiology: Dg Chest 2 View  Result Date: 12/22/2016 CLINICAL DATA:  Coronary artery bypass graft, shortness of breath. EXAM: CHEST  2 VIEW COMPARISON:  Radiographs of Dec 21, 2016. FINDINGS: Stable cardiomegaly. Status post coronary artery bypass graft. Left-sided pacemaker is unchanged in position. Right-sided PICC line is unchanged in position. Stable large left pleural effusion is noted with probable underlying atelectasis or infiltrate. No pneumothorax is noted.  Mild central pulmonary vascular congestion is noted with probable bilateral pulmonary edema. Bony thorax is unremarkable. IMPRESSION: Stable large left pleural effusion with probable underlying atelectasis or edema. Mild central pulmonary vascular congestion is noted with probable bilateral pulmonary edema. Electronically Signed   By: Marijo Conception, M.D.   On: 12/22/2016 07:24     Assessment/Plan: S/P Procedure(s) (LRB): CORONARY ARTERY BYPASS GRAFTING (CABG)x 4 WITH ENDOSCOPIC HARVESTING OF RIGHT SAPHENOUS VEIN (N/A) CLIPPING OF ATRIAL APPENDAGE USING ATRICURE 45 ATRICLIP (Left) INTRAOPERATIVE TRANSESOPHAGEAL ECHOCARDIOGRAM (N/A) MAZE PROCEDURE (N/A)  1. CV-Previous PAF. Remains in SR. Co ox increased to 65.8%-on Milrinone drip, titration per heart failure. Amiodarone 200 mg bid, Hydralazine 12.5 mg tid. 2. Pulmonary-On room air. CXR this am shows stable large eft pleural effusion/atelectasis, central pulmonary congestion. She likely would benefit from a left thoracentesis so will order. Encourage incentive spirometer. 3. Volume overload-On Metolazone 2.5 mg bid and Lasix 80 mg IV tid. Per heart failure. 4. ABL anemia- H and H slightly decreased to 9.1 and 28.9 (s/p transfusion yesterday). 5. AKI-creatinine slightly increased from 1.12 to 1.44 6. DM-CBGs 132/86/89. On Insulin and Glipizide 5 mg every am. 7. UTI-on Rocephin for E. Coli. And pyridium  Arnoldo Lenis 12/22/2016 8:53 AM    Chart reviewed, patient examined, agree with above. She had thoracentesis of 1.2 L from left pleural space today and feels much better. She was able to ambulate and sats 99% on RA. Milrinone decreased today to 0.25 since Co-ox 66%. Maintaining sinus rhythm.  Urine clearing. Discussed status and plans with patient and family.

## 2016-12-22 NOTE — Procedures (Signed)
PROCEDURE SUMMARY:  Successful US guided left therapeutic thoracentesis. Yielded 1.2 liters of amber fluid. Pt tolerated procedure well. No immediate complications.  Specimen was not sent for labs. CXR ordered.  Docia Barrier PA-C 12/22/2016 12:00 PM

## 2016-12-22 NOTE — Progress Notes (Signed)
Patient ID: [name], female   DOB: 1946/04/18, 71 y.o.   MRN: [id number] EVENING ROUNDS NOTE :     [address]       [employer] [address]             [phone]                 18 Days Post-Op Procedure(s) (LRB): CORONARY ARTERY BYPASS GRAFTING (CABG)x 4 WITH ENDOSCOPIC HARVESTING OF RIGHT SAPHENOUS VEIN (N/A) CLIPPING OF ATRIAL APPENDAGE USING ATRICURE 45 ATRICLIP (Left) INTRAOPERATIVE TRANSESOPHAGEAL ECHOCARDIOGRAM (N/A) MAZE PROCEDURE (N/A)  Total Length of Stay:  LOS: 25 days  BP (!) 120/57   Pulse 76   Temp 97.4 F (36.3 C) (Oral)   Resp 17   Ht 5\' 4"  (1.626 m)   Wt 184 lb 1.6 oz (83.5 kg)   SpO2 100%   BMI 31.60 kg/m   .Intake/Output      05/21 0701 - 05/22 0700   P.O. 480   I.V. (mL/kg) 77 (0.9)   Blood    Other 30   IV Piggyback 50   Total Intake(mL/kg) 637 (7.6)   Urine (mL/kg/hr) 1800 (1.8)   Total Output 1800   Net -1163         . cefTRIAXone (ROCEPHIN)  IV Stopped (12/22/16 1016)  . milrinone 0.25 mcg/kg/min (12/22/16 1900)     Lab Results  Component Value Date   WBC 7.4 12/22/2016   HGB 9.1 (L) 12/22/2016   HCT 28.9 (L) 12/22/2016   PLT 349 12/22/2016   GLUCOSE 83 12/22/2016   CHOL 139 11/28/2016   TRIG 88 11/28/2016   HDL 33 (L) 11/28/2016   LDLCALC 88 11/28/2016   ALT 29 12/17/2016   AST 23 12/17/2016   NA 132 (L) 12/22/2016   K 4.3 12/22/2016   CL 94 (L) 12/22/2016   CREATININE 1.44 (H) 12/22/2016   BUN 37 (H) 12/22/2016   CO2 30 12/22/2016   TSH 4.14 06/16/2016   INR 1.34 12/04/2016   HGBA1C 7.0 (H) 12/06/2016   Feels better since thoracentesis today 1.2 liters drained  Dg Chest 1 View  Result Date: 12/22/2016 CLINICAL DATA:  S/P thoracentesis 675449, left side EXAM: CHEST 1 VIEW COMPARISON:  Chest radiograph 5-21 -18 FINDINGS: Interval reduction in pleural fluid following thoracentesis. No pneumothorax appreciated. Moderate LEFT effusion remains. RIGHT lung is clear. PICC line noted. IMPRESSION: 1. No  appreciable pneumothorax following LEFT thoracentesis. 2. Interval reduction in pleural fluid with persistent moderate LEFT effusion. Electronically Signed   By: Suzy Bouchard M.D.   On: 12/22/2016 12:19    Grace Isaac MD  Beeper [phone] Office [phone] 12/22/2016 7:08 PM

## 2016-12-22 NOTE — Progress Notes (Signed)
Advanced Heart Failure Rounding Note   Subjective:    Transferred to ICU due to cardiogenic shock --> placed on norepi and milrinone. Initial CO-OX 34%.  EF 40-45% by echo   Off heparin due to hematuria. CT abd 5/18 reviewed personally. Shows no stones or masses.   Feeling OK this am. Tired from yesterday with transfusion and foley had to be changed with leaking balloon. Denies SOB. Still sore along her chest.   I/O skewed by CBI. (8000 cc of her input/output), though stil out ~ 5000 cc of urine. Negative 3.2 L.   Remains in NSR on amio po 200 mg BID.   Co-ox 65.8% this am on milrinone 0.375 mcg/kg/min. CVP 9-10 range. CXR this am with stable large left pleural effusion with underlying atelectasis/edema. (Images reviewed personally)  Hgb 9.1 with 1 UPRBCs 12/21/16 (3 so far this admit)   Objective:     Vital Signs:   Temp:  [96.3 F (35.7 C)-97.2 F (36.2 C)] 97.2 F (36.2 C) (05/20 1500) Pulse Rate:  [66-82] 71 (05/21 0700) Resp:  [12-26] 21 (05/21 0700) BP: (96-134)/(49-107) 123/59 (05/21 0700) SpO2:  [91 %-100 %] 95 % (05/21 0700) Weight:  [184 lb 1.6 oz (83.5 kg)] 184 lb 1.6 oz (83.5 kg) (05/21 0500) Last BM Date: 12/20/16  Weight change: Filed Weights   12/19/16 0814 12/20/16 0500 12/22/16 0500  Weight: 193 lb 9 oz (87.8 kg) 186 lb 11.7 oz (84.7 kg) 184 lb 1.6 oz (83.5 kg)    Intake/Output:   Intake/Output Summary (Last 24 hours) at 12/22/16 0743 Last data filed at 12/22/16 0700  Gross per 24 hour  Intake          9836.43 ml  Output            13125 ml  Net         -3288.57 ml     Physical Exam: CVP 9-10 General: Well appearing. No resp difficulty. HEENT: normal Neck: supple. JVP 9 arotids 2+ bilat; no bruits. No thyromegaly or nodule noted. Cor: PMI nondisplaced. RRR, No M/G/R noted Lungs: Crackles right bases. Dull to 1/2 up L side.  Abdomen: soft, non-tender, distended, no HSM. No bruits or masses. +BS  Extremities: no cyanosis, clubbing, rash, R  and LLE. Trace to 1+ edema.   Neuro: alert & orientedx3, cranial nerves grossly intact. moves all 4 extremities w/o difficulty. Affect flat.   Telemetry: Personally reviewed, NSR 70-80s   Labs: Basic Metabolic Panel:  Recent Labs Lab 12/18/16 0328 12/19/16 0400 12/20/16 0506 12/21/16 0513 12/22/16 0423  NA 134* 133* 133* 136 132*  K 4.9 4.1 4.0 3.1* 4.3  CL 95* 93* 94* 104 94*  CO2 28 29 30 25 30   GLUCOSE 205* 185* 131* 94 83  BUN 55* 47* 45* 34* 37*  CREATININE 1.77* 1.69* 1.71* 1.12* 1.44*  CALCIUM 8.4* 8.2* 8.4* 6.6* 8.2*  MG  --  2.7*  --   --   --     Liver Function Tests:  Recent Labs Lab 12/17/16 1211  AST 23  ALT 29  ALKPHOS 120  BILITOT 0.6  PROT 6.6  ALBUMIN 3.1*   No results for input(s): LIPASE, AMYLASE in the last 168 hours. No results for input(s): AMMONIA in the last 168 hours.  CBC:  Recent Labs Lab 12/19/16 0400 12/20/16 0506 12/21/16 0513 12/21/16 1048 12/21/16 2100 12/22/16 0423  WBC 13.8* 10.0 7.7 10.2  --  7.4  HGB 7.9* 8.0* 6.6* 8.6* 9.9* 9.1*  HCT 25.5* 25.7* 21.6* 28.3* 31.8* 28.9*  MCV 87.0 87.4 87.1 87.6  --  87.6  PLT 406* 363 279 390  --  349    Cardiac Enzymes: No results for input(s): CKTOTAL, CKMB, CKMBINDEX, TROPONINI in the last 168 hours.  BNP: BNP (last 3 results)  Recent Labs  11/27/16 0159 12/17/16 1255  BNP 920.0* 1,062.6*    ProBNP (last 3 results) No results for input(s): PROBNP in the last 8760 hours.    Other results:  Imaging: Dg Chest 2 View  Result Date: 12/22/2016 CLINICAL DATA:  Coronary artery bypass graft, shortness of breath. EXAM: CHEST  2 VIEW COMPARISON:  Radiographs of Dec 21, 2016. FINDINGS: Stable cardiomegaly. Status post coronary artery bypass graft. Left-sided pacemaker is unchanged in position. Right-sided PICC line is unchanged in position. Stable large left pleural effusion is noted with probable underlying atelectasis or infiltrate. No pneumothorax is noted. Mild central  pulmonary vascular congestion is noted with probable bilateral pulmonary edema. Bony thorax is unremarkable. IMPRESSION: Stable large left pleural effusion with probable underlying atelectasis or edema. Mild central pulmonary vascular congestion is noted with probable bilateral pulmonary edema. Electronically Signed   By: Marijo Conception, M.D.   On: 12/22/2016 07:24   Dg Chest Port 1 View  Result Date: 12/21/2016 CLINICAL DATA:  CABG EXAM: PORTABLE CHEST 1 VIEW COMPARISON:  12/17/2016 FINDINGS: Prior CABG. Left pacer remains in place, unchanged. Cardiomegaly with large left pleural effusion, increasing since prior study. Bilateral airspace opacities likely reflect edema. IMPRESSION: Enlarging left effusion. Diffuse bilateral airspace disease, likely edema/CHF. Electronically Signed   By: Rolm Baptise M.D.   On: 12/21/2016 07:17     Medications:     Scheduled Medications: . amiodarone  200 mg Oral BID  . atorvastatin  40 mg Oral q1800  . Chlorhexidine Gluconate Cloth  6 each Topical Daily  . feeding supplement (ENSURE ENLIVE)  237 mL Oral BID BM  . folic acid  1 mg Oral Daily  . furosemide  80 mg Intravenous Q8H  . glipiZIDE  5 mg Oral Q breakfast  . hydrALAZINE  12.5 mg Oral Q8H  . insulin aspart  0-15 Units Subcutaneous TID WC  . mouth rinse  15 mL Mouth Rinse BID  . metolazone  2.5 mg Oral BID  . polyethylene glycol  17 g Oral Daily  . potassium chloride  40 mEq Oral BID  . sorbitol  30 mL Oral q morning - 10a    Infusions: . cefTRIAXone (ROCEPHIN)  IV Stopped (12/21/16 0920)  . milrinone 0.375 mcg/kg/min (12/22/16 0631)    PRN Medications: acetaminophen, ALPRAZolam, ALPRAZolam, alum & mag hydroxide-simeth, bisacodyl **OR** bisacodyl, magnesium hydroxide, ondansetron (ZOFRAN) IV, oxyCODONE, sodium chloride flush, traMADol   Assessment/Plan/Discussion     1. Acute on chronic systolic HF -> Cardiogenic Shock - Co-ox 65.8% on milrinone 0.375 mcg/kg/min.  - Volume status  remains elevated and CXR with stable to worsening infiltrates and left effusion (viewed personally.)  - Continue IV lasix 80 mg q 8 hrs and metolazone 2.5 mg BID. May need to drop off evening dose of metolazone.  Hopefully to po in next 24-48 hours with improved CVP.  - BP dropped with addition of hydralazine. On hold for now.  - No ACE/ARB with CKD. No b-blocker with low output. 2.  Left pleural effusion - Large on CXR. Suspect she will need thoracentesis. Will discuss with MD.  3. CAD S/P CABG L Atrial Appendage Clipp Maze 12/04/2016 - On statin and  aspirin - Chest pain free.  - Surgery following.  3. PAF  - remains in NSR on po amio 200 mg BID.  - Off heparin due to hematuria 4. Hematuria - CT ab negative. Has Foley for irrigation. - Received 1 UPRBC yesterday with improvement of Hgb.  5. AKI: - Creatinine 1.1 -> 1.4. Continue to follow closely.  6. UTI: -  On bactrim 5/14 through 5/16. Switched to rocephin 5/16 . Urine CX + EColi  - Blood cultures NGTD - Continue pyridium.   7. Deconditioning - Mobilize. 8. Symptomatic anemia - Improved s/p transfusion 12/21/16    Continue to diurese with IV at least today. Suspect she will need Thoracentesis today.  Awaiting TCTS.   Length of Stay: [place], [place]  Advanced Heart Failure Team  Pager [phone] M-F 7am-4pm.  Please contact [facility] for night-coverage after hours (4p -7a ) and weekends on [email or web address]  Patient seen and examined with the above-signed Advanced Practice Provider and/or Housestaff. I personally reviewed laboratory data, imaging studies and relevant notes. I independently examined the patient and formulated the important aspects of the plan. I have edited the note to reflect any of my changes or salient points. I have personally discussed the plan with the patient and/or family.  She has diuresed well with IV lasix. CVP still up. Will continue IV lasix one more day. Maintaining NSR on po amio.     Co-ox improved with inotropes. Will wean milrinone to 0.25. Need to wean slowly.  Hgb continues to drop a bit. Need to follow closely. Hematuria has slowed.  For thoracentesis or L effusion today with IR.   Continue to mobilize.   Glori Bickers, MD  10:12 AM

## 2016-12-22 NOTE — Progress Notes (Signed)
Physical Therapy Treatment Patient Details Name: [name] MRN: [id number] DOB: 1945/11/08 Today's Date: 12/22/2016    History of Present Illness 71 yo s/p NSTEMI with CABG 5/3. PMhx: CAD, CHF, VT, orthostatic hypotension, ICD.  Transferred to ICU on due to cardiogenic shock on 5/16 with lower HGBs.  Pt also noticed to have clots in her urine, so heparin stopped and CT of abdomen ordered which did not show any acute processes in the abdomen, but did show bil pleural effusions.      PT Comments    Pt very anxious this date regarding thoracentesis. Pt with low temp of 95, RN aware. Pt amb 120' however freq rest break required. SpO2 >92% on RA.  Pt remains very deconditioned but has improved since initial evaluation. Pt currently remains appropriate for SNF however if pt with appropriate support and con't to improve to a supervision level pt may be able to go home with 24/7 assist.   Follow Up Recommendations  SNF;Supervision/Assistance - 24 hour     Equipment Recommendations  None recommended by PT    Recommendations for Other Services       Precautions / Restrictions Precautions Precautions: Fall;Sternal Precaution Comments: pt with poor recall of sternal precautions, pt reports "i keep forgeting" Restrictions Weight Bearing Restrictions: Yes Other Position/Activity Restrictions: limited UE push/pull due to sternal precautions    Mobility  Bed Mobility Overal bed mobility: Needs Assistance Bed Mobility: Rolling;Sidelying to Sit Rolling: Min assist Sidelying to sit: Min assist;HOB elevated       General bed mobility comments: v/c's to adhere to sternal precautions to not pull up with UEs  Transfers Overall transfer level: Needs assistance Equipment used: 1 person hand held assist Transfers: Sit to/from Stand Sit to Stand: Min assist         General transfer comment: v/c's to not pull up with UEs, minA to rock and help hold heart pillow over chest and push up with  LEs  Ambulation/Gait Ambulation/Gait assistance: Min guard Ambulation Distance (Feet): 120 Feet Assistive device: Rolling walker (2 wheeled) Gait Pattern/deviations: Step-through pattern;Decreased stride length Gait velocity: slow Gait velocity interpretation: Below normal speed for age/gender General Gait Details: v/c's to minimize UE WBing. freq standing rest breaks due to DOE, pt anxious. SpO2 >92% on RA t/o amb. pt repeated stated "I don't know how much I can walk today"   Stairs            Wheelchair Mobility    Modified Rankin (Stroke Patients Only)       Balance Overall balance assessment: Needs assistance Sitting-balance support: Feet supported Sitting balance-Leahy Scale: Fair     Standing balance support: Single extremity supported Standing balance-Leahy Scale: Poor                              Cognition Arousal/Alertness: Awake/alert Behavior During Therapy: Anxious Overall Cognitive Status: Within Functional Limits for tasks assessed                                 General Comments: pt very anxious regarding thoracentesis limiting OOB mobility      Exercises      General Comments        Pertinent Vitals/Pain Pain Assessment: No/denies pain    Home Living  Prior Function            PT Goals (current goals can now be found in the care plan section) Acute Rehab PT Goals Patient Stated Goal: get better Progress towards PT goals: Progressing toward goals    Frequency    Min 3X/week      PT Plan Current plan remains appropriate    Co-evaluation              AM-PAC PT "6 Clicks" Daily Activity  Outcome Measure  Difficulty turning over in bed (including adjusting bedclothes, sheets and blankets)?: A Little Difficulty moving from lying on back to sitting on the side of the bed? : A Little Difficulty sitting down on and standing up from a chair with arms (e.g., wheelchair,  bedside commode, etc,.)?: A Little Help needed moving to and from a bed to chair (including a wheelchair)?: A Little Help needed walking in hospital room?: A Little Help needed climbing 3-5 steps with a railing? : A Little 6 Click Score: 18    End of Session Equipment Utilized During Treatment: Gait belt Activity Tolerance:  (limited by anxety) Patient left: in chair;with call bell/phone within reach;with nursing/sitter in room (in warming blankets due to temp of 95) Nurse Communication: Mobility status PT Visit Diagnosis: Muscle weakness (generalized) (M62.81);Difficulty in walking, not elsewhere classified (R26.2);Pain     Time: 2111-7356 PT Time Calculation (min) (ACUTE ONLY): 28 min  Charges:  $Gait Training: 8-22 mins $Therapeutic Activity: 8-22 mins                    G Codes:       Kittie Plater, PT, DPT Pager #: [phone] Office #: [phone]    [place] 12/22/2016, 8:37 AM

## 2016-12-23 LAB — BASIC METABOLIC PANEL
ANION GAP: 11 (ref 5–15)
BUN: 38 mg/dL — AB (ref 6–20)
CO2: 28 mmol/L (ref 22–32)
Calcium: 8.6 mg/dL — ABNORMAL LOW (ref 8.9–10.3)
Chloride: 93 mmol/L — ABNORMAL LOW (ref 101–111)
Creatinine, Ser: 1.65 mg/dL — ABNORMAL HIGH (ref 0.44–1.00)
GFR calc Af Amer: 36 mL/min — ABNORMAL LOW (ref >60)
GFR, EST NON AFRICAN AMERICAN: 31 mL/min — AB (ref >60)
Glucose, Bld: 117 mg/dL — ABNORMAL HIGH (ref 65–99)
POTASSIUM: 5.1 mmol/L (ref 3.5–5.1)
SODIUM: 132 mmol/L — AB (ref 135–145)

## 2016-12-23 LAB — CULTURE, BLOOD (ROUTINE X 2)
CULTURE: NO GROWTH
Culture: NO GROWTH
Special Requests: ADEQUATE
Special Requests: ADEQUATE

## 2016-12-23 LAB — GLUCOSE, CAPILLARY
GLUCOSE-CAPILLARY: 91 mg/dL (ref 65–99)
GLUCOSE-CAPILLARY: 122 mg/dL — AB (ref 65–99)
GLUCOSE-CAPILLARY: 114 mg/dL — AB (ref 65–99)
GLUCOSE-CAPILLARY: 128 mg/dL — AB (ref 65–99)

## 2016-12-23 LAB — CBC
HEMATOCRIT: 31 % — AB (ref 36.0–46.0)
HEMOGLOBIN: 9.6 g/dL — AB (ref 12.0–15.0)
MCH: 27.2 pg (ref 26.0–34.0)
MCHC: 31 g/dL (ref 30.0–36.0)
MCV: 87.8 fL (ref 78.0–100.0)
Platelets: 405 10*3/uL — ABNORMAL HIGH (ref 150–400)
RBC: 3.53 MIL/uL — AB (ref 3.87–5.11)
RDW: 16.4 % — ABNORMAL HIGH (ref 11.5–15.5)
WBC: 8.4 10*3/uL (ref 4.0–10.5)

## 2016-12-23 LAB — COOXEMETRY PANEL
CARBOXYHEMOGLOBIN: 1.7 % — AB (ref 0.5–1.5)
METHEMOGLOBIN: 1.2 % (ref 0.0–1.5)
O2 SAT: 70.9 %
Total hemoglobin: 9.6 g/dL — ABNORMAL LOW (ref 12.0–16.0)

## 2016-12-23 MED ORDER — MICONAZOLE NITRATE 2 % VA CREA
1.0000 | TOPICAL_CREAM | Freq: Every evening | VAGINAL | Status: DC | PRN
  Filled 2016-12-23: qty 45

## 2016-12-23 MED ORDER — LOSARTAN POTASSIUM 25 MG PO TABS
25.0000 mg | ORAL_TABLET | Freq: Every day | ORAL | Status: DC
  Administered 2016-12-23: 25 mg via ORAL
  Filled 2016-12-23: qty 1

## 2016-12-23 MED ORDER — CLOTRIMAZOLE 1 % VA CREA
1.0000 | TOPICAL_CREAM | Freq: Every day | VAGINAL | Status: AC
  Administered 2016-12-23 – 2016-12-28 (×4): 1 via VAGINAL
  Filled 2016-12-23: qty 45

## 2016-12-23 MED ORDER — MICONAZOLE NITRATE 100 MG VA SUPP
100.0000 mg | Freq: Every day | VAGINAL | Status: DC
  Filled 2016-12-23: qty 7

## 2016-12-23 MED ORDER — FUROSEMIDE 40 MG PO TABS
40.0000 mg | ORAL_TABLET | Freq: Every day | ORAL | Status: DC
  Administered 2016-12-24: 40 mg via ORAL
  Filled 2016-12-23: qty 1

## 2016-12-23 NOTE — Progress Notes (Signed)
Nutrition Follow-up  DOCUMENTATION CODES:   Obesity unspecified  INTERVENTION:   Continue Ensure Enlive po BID, each supplement provides 350 kcal and 20 grams of protein Encourage PO intake at meals  NUTRITION DIAGNOSIS:   Inadequate oral intake related to altered GI function (decreased appetite) as evidenced by per patient/family report, meal completion < 50%. Ongoing.   GOAL:   Patient will meet greater than or equal to 90% of their needs Progressing.   MONITOR:   PO intake, Supplement acceptance, Labs, Weight trends, Skin, I & O's  ASSESSMENT:   Pt with hx of CAD, CHF, PAF, DM, pulmonary HTN admitted 4/26 with chest pain, s/p CABG x 5 on 5/4. Pt with nausea, finally had BM 5/7 after enema.   Meal Completion: mostly 30% of her meals, on 5/20 pt did eat 75-100% of two meals that day 5/21 L thoracentesis with 1.2 L fluid removed Pt is negative 4 L, transitioning to oral lasix Pt feeling much better today, walking with cardiac rehab.   Diet Order:  Diet Carb Modified Fluid consistency: Thin; Room service appropriate? 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  Estimated Nutritional Needs:   Kcal:  1600-1800  Protein:  95-115 grams  Fluid:  >1.7 L/day  EDUCATION NEEDS:   No education needs identified at this time  [place], [place], [place] Pager [phone] After Hours Pager

## 2016-12-23 NOTE — Progress Notes (Signed)
12/23/2016 6:29 PM Pt. Able to void 50cc of clear yellow urine spontaneously. Bladder scan still revealing 495 ml residual urine in bladder. Pt. Denies discomfort at this time. Does not want foley replaced if possible. Dr. Cyndia Bent on floor and made aware. Verbal order received continue to monitor patient and if unable to spontaneously empty bladder sufficiently within 10-12 hours post original removal time or if pt. Becomes uncomfortable then replace foley. Pt. Updated on plan of care. Will continue to closely monitor patient.  [name], Arville Lime

## 2016-12-23 NOTE — Progress Notes (Signed)
CARDIAC REHAB PHASE I   PRE:  Rate/Rhythm: 77 SR    BP: sitting 118/58    SaO2: 100 RA  MODE:  Ambulation: 290 ft   POST:  Rate/Rhythm: 84 SR    BP: sitting 131/76     SaO2: 99 RA  Pt feels great. Able to walk with RW, no assist needed except for IV pole. No c/o walking, smiling entire walk. Farthest distance yet. Return to recliner. Encouraged getting third walk with PT later. Will f/u tomorrow. [id number]   [place], ACSM 12/23/2016 11:35 AM

## 2016-12-23 NOTE — Progress Notes (Signed)
Advanced Heart Failure Rounding Note   Subjective:    Transferred to ICU due to cardiogenic shock --> placed on norepi and milrinone. Initial CO-OX 34%.  EF 40-45% by echo   Off heparin due to hematuria. CT abd 5/18 reviewed personally. Shows no stones or masses.   S/p IR thoracentesis 12/22/16 with 1.2 L out.   Feels great today. Walked halls this morning with a little bit of fatigue but no SOB. Denies lightheadedness or dizziness.   Negative 3.4 L yesterday. (4.1 L UO). Weight down 8 lbs. ( includes 1.2L out from thoracentesis)  Remains in NSR on amio po 200 mg BID.   Co-ox 70.9% this am on milrinone 0.375 mcg/kg/min. Hgb 9.6 with 3 UPRBCs so far this admit.   Objective:     Vital Signs:   Temp:  [93.6 F (34.2 C)-97.6 F (36.4 C)] 97.6 F (36.4 C) (05/22 0700) Pulse Rate:  [71-87] 85 (05/22 0700) Resp:  [12-24] 13 (05/22 0700) BP: (99-143)/(49-87) 123/63 (05/22 0700) SpO2:  [91 %-100 %] 98 % (05/22 0700) Weight:  [176 lb 12.9 oz (80.2 kg)] 176 lb 12.9 oz (80.2 kg) (05/22 0500) Last BM Date: 12/20/16  Weight change: Filed Weights   12/20/16 0500 12/22/16 0500 12/23/16 0500  Weight: 186 lb 11.7 oz (84.7 kg) 184 lb 1.6 oz (83.5 kg) 176 lb 12.9 oz (80.2 kg)    Intake/Output:   Intake/Output Summary (Last 24 hours) at 12/23/16 0735 Last data filed at 12/23/16 0700  Gross per 24 hour  Intake            717.4 ml  Output             4125 ml  Net          -3407.6 ml     Physical Exam: General: Well appearing. No resp difficulty. HEENT: normal Neck: supple. JVD 6-7. Carotids 2+ bilat; no bruits. No thyromegaly or nodule noted. Cor: PMI nondisplaced. RRR, No M/G/R noted Lungs: Slight crackles right base. Dull L base.  Abdomen: soft, non-tender, distended, no HSM. No bruits or masses. +BS  Extremities: no cyanosis, clubbing, rash, R and LLE with trace to 1+ edema. Neuro: alert & orientedx3, cranial nerves grossly intact. moves all 4 extremities w/o difficulty.  Affect pleasant   Telemetry: Personally reviewed, NSR 70-80s   Labs: Basic Metabolic Panel:  Recent Labs Lab 12/19/16 0400 12/20/16 0506 12/21/16 0513 12/22/16 0423 12/23/16 0253  NA 133* 133* 136 132* 132*  K 4.1 4.0 3.1* 4.3 5.1  CL 93* 94* 104 94* 93*  CO2 29 30 25 30 28   GLUCOSE 185* 131* 94 83 117*  BUN 47* 45* 34* 37* 38*  CREATININE 1.69* 1.71* 1.12* 1.44* 1.65*  CALCIUM 8.2* 8.4* 6.6* 8.2* 8.6*  MG 2.7*  --   --   --   --     Liver Function Tests:  Recent Labs Lab 12/17/16 1211  AST 23  ALT 29  ALKPHOS 120  BILITOT 0.6  PROT 6.6  ALBUMIN 3.1*   No results for input(s): LIPASE, AMYLASE in the last 168 hours. No results for input(s): AMMONIA in the last 168 hours.  CBC:  Recent Labs Lab 12/20/16 0506 12/21/16 0513 12/21/16 1048 12/21/16 2100 12/22/16 0423 12/23/16 0253  WBC 10.0 7.7 10.2  --  7.4 8.4  HGB 8.0* 6.6* 8.6* 9.9* 9.1* 9.6*  HCT 25.7* 21.6* 28.3* 31.8* 28.9* 31.0*  MCV 87.4 87.1 87.6  --  87.6 87.8  PLT 363  279 390  --  349 405*    Cardiac Enzymes: No results for input(s): CKTOTAL, CKMB, CKMBINDEX, TROPONINI in the last 168 hours.  BNP: BNP (last 3 results)  Recent Labs  11/27/16 0159 12/17/16 1255  BNP 920.0* 1,062.6*    ProBNP (last 3 results) No results for input(s): PROBNP in the last 8760 hours.    Other results:  Imaging: Dg Chest 1 View  Result Date: 12/22/2016 CLINICAL DATA:  S/P thoracentesis [id number], left side EXAM: CHEST 1 VIEW COMPARISON:  Chest radiograph 5-21 -18 FINDINGS: Interval reduction in pleural fluid following thoracentesis. No pneumothorax appreciated. Moderate LEFT effusion remains. RIGHT lung is clear. PICC line noted. IMPRESSION: 1. No appreciable pneumothorax following LEFT thoracentesis. 2. Interval reduction in pleural fluid with persistent moderate LEFT effusion. Electronically Signed   By: Suzy Bouchard M.D.   On: 12/22/2016 12:19   Dg Chest 2 View  Result Date: 12/22/2016 CLINICAL  DATA:  Coronary artery bypass graft, shortness of breath. EXAM: CHEST  2 VIEW COMPARISON:  Radiographs of Dec 21, 2016. FINDINGS: Stable cardiomegaly. Status post coronary artery bypass graft. Left-sided pacemaker is unchanged in position. Right-sided PICC line is unchanged in position. Stable large left pleural effusion is noted with probable underlying atelectasis or infiltrate. No pneumothorax is noted. Mild central pulmonary vascular congestion is noted with probable bilateral pulmonary edema. Bony thorax is unremarkable. IMPRESSION: Stable large left pleural effusion with probable underlying atelectasis or edema. Mild central pulmonary vascular congestion is noted with probable bilateral pulmonary edema. Electronically Signed   By: Marijo Conception, M.D.   On: 12/22/2016 07:24   Ir Thoracentesis Asp Pleural Space W/img Guide  Result Date: 12/22/2016 INDICATION: Patient status post CABG. Now with left-sided pleural effusion. Request is made for therapeutic left thoracentesis EXAM: ULTRASOUND GUIDED THERAPEUTIC LEFT THORACENTESIS MEDICATIONS: 10 mL 1% lidocaine COMPLICATIONS: None immediate. PROCEDURE: An ultrasound guided thoracentesis was thoroughly discussed with the patient and questions answered. The benefits, risks, alternatives and complications were also discussed. The patient understands and wishes to proceed with the procedure. Written consent was obtained. Ultrasound was performed to localize and mark an adequate pocket of fluid in the left chest. The area was then prepped and draped in the normal sterile fashion. 1% Lidocaine was used for local anesthesia. Under ultrasound guidance a Safe-T-centesis catheter was introduced. Thoracentesis was performed. The catheter was removed and a dressing applied. FINDINGS: A total of approximately 1.2 liters of amber fluid was removed. IMPRESSION: Successful ultrasound guided left therapeutic thoracentesis yielding 1.2 liters of pleural fluid. Read by:  Brynda Greathouse PA-C Electronically Signed   By: Markus Daft M.D.   On: 12/22/2016 15:24     Medications:     Scheduled Medications: . amiodarone  200 mg Oral BID  . atorvastatin  40 mg Oral q1800  . Chlorhexidine Gluconate Cloth  6 each Topical Daily  . feeding supplement (ENSURE ENLIVE)  237 mL Oral BID BM  . folic acid  1 mg Oral Daily  . furosemide  80 mg Intravenous Q8H  . glipiZIDE  5 mg Oral Q breakfast  . hydrALAZINE  12.5 mg Oral Q8H  . insulin aspart  0-15 Units Subcutaneous TID WC  . mouth rinse  15 mL Mouth Rinse BID  . polyethylene glycol  17 g Oral Daily  . potassium chloride  40 mEq Oral BID  . sorbitol  30 mL Oral q morning - 10a    Infusions: . cefTRIAXone (ROCEPHIN)  IV Stopped (12/22/16 1016)  .  milrinone 0.25 mcg/kg/min (12/23/16 0700)    PRN Medications: acetaminophen, ALPRAZolam, ALPRAZolam, alum & mag hydroxide-simeth, bisacodyl **OR** bisacodyl, magnesium hydroxide, ondansetron (ZOFRAN) IV, oxyCODONE, sodium chloride flush, traMADol   Assessment/Plan/Discussion     1. Acute on chronic systolic HF -> Cardiogenic Shock - Co-ox 70.9% on milrinone 0.25 mcg/kg/min.  Will cut back to 0.125 mcg/kg/min.  - Volume status much improved. CVP 6-7  - Got IV lasix this am.  Will transition to po. She was on lasix 40 mg every other day at home. Will likely need daily, at least 40-60 mg.  - BP dropped with addition of hydralazine. On hold for now.  - No ACE/ARB with CKD. No b-blocker with low output. 2.  Left pleural effusion - s/p Thoracentesis 12/22/16 with 1.2 L out.  - CXR afterwards with mild improvement. Images reviewed personally.  3. 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  - Surgery following.  Stable.  3. PAF  - remains in NSR on po amio 200 mg BID.  - Off heparin due to hematuria. Change. 4. Hematuria - CT ab negative. Has Foley for irrigation. - Received 1 UPRBC 12/21/16 with improvement of Hgb.  (Total of 3 UPRBCs thus far) 5. AKI: - Creatinine 1.1 -> 1.4 -> 1.6.   6. UTI: -  On bactrim 5/14 through 5/16. Switched to rocephin 5/16 . Urine CX + EColi  - Blood cultures NGTD - Continue pyridium.  No change.  7. Deconditioning - Continue to mobilize.  8. Symptomatic anemia - Improved s/p transfusion 12/21/16   - Stable today. No overt bleeding.   Continue to mobilize.  CVP 0-1 sitting upright in chair. Will recheck once lying in bed. Suspect closer to 6-7 range (9-10 yesterday and good diuresis)  Length of Stay: [place], [place]  Advanced Heart Failure Team  Pager [phone] M-F 7am-4pm.  Please contact [facility] for night-coverage after hours (4p -7a ) and weekends on [email or web address]  Patient seen and examined with the above-signed Advanced Practice Provider and/or Housestaff. I personally reviewed laboratory data, imaging studies and relevant notes. I independently examined the patient and formulated the important aspects of the plan. I have edited the note to reflect any of my changes or salient points. I have personally discussed the plan with the patient and/or family.  She is s/p thoracentesis with 1.2 L out. Feeling better. Co-ox and CVP ok. Will cut milrinone back to 0.125. Switch to po lasix.   Remains in NSR. No anticoag with hematuria.   Creatinine up slightly. Will stop IV lasix.   Continue to mobilize. Hopefully can move to 2W tomorrow.   Glori Bickers, MD  3:44 PM

## 2016-12-23 NOTE — Progress Notes (Signed)
12/23/2016 4:39 PM Pt. Without spontaneous void since foley removal at 1030. Pt. Attempted to void in bathroom without success. Pt. Denies pain, distention or urgency. Bladder scan revealed 446 ml of residual urine in bladder. Pt. States she feels fine at this time, no sensation to void. Will reassess in one hour. Will continue to closely monitor patient.  Layton Tappan, Arville Lime

## 2016-12-23 NOTE — Consult Note (Signed)
   [facility] Inpatient Consult   12/23/2016  [name] 1946-06-05 [id number]    [facility] Care Management referral received from [employer]ization Management. Will continue to follow and engage for potential [facility] Care Management follow up. Currently patient in ICU.  [name], MSN-Ed, RN,BSN [facility] Liaison [phone]

## 2016-12-23 NOTE — Progress Notes (Signed)
Occupational Therapy Treatment Patient Details Name: [name] MRN: [id number] DOB: [birth year] Today's Date: 12/23/2016    History of present illness 71 yo s/p NSTEMI with CABG 5/3. PMhx: CAD, CHF, VT, orthostatic hypotension, ICD.  Transferred to ICU on due to cardiogenic shock on 5/16 with lower HGBs.  Pt also noticed to have clots in her urine, so heparin stopped and CT of abdomen ordered which did not show any acute processes in the abdomen, but did show bil pleural effusions.     OT comments  Pt demonstrates excellent progress.  She is able to perform ADLs with supervision to min guard assist.  She has 24 hour assist at home if needed.  Recommend HHOT.   Follow Up Recommendations  Home health OT;Supervision/Assistance - 24 hour    Equipment Recommendations  3 in 1 bedside commode    Recommendations for Other Services      Precautions / Restrictions Precautions Precautions: Fall;Sternal Precaution Comments: Pt able to state all sternal precautions.  Recquires min cues to adhere to them during ADLs  Restrictions Weight Bearing Restrictions: Yes Other Position/Activity Restrictions: limited UE push/pull due to sternal precautions       Mobility Bed Mobility Overal bed mobility: Needs Assistance Bed Mobility: Rolling;Sit to Sidelying Rolling: Supervision       Sit to sidelying: Supervision General bed mobility comments: cues for sequencing and technique to maintain sternal precautions  Transfers Overall transfer level: Needs assistance Equipment used: Rolling walker (2 wheeled) Transfers: Sit to/from [employer] Transfers Sit to Stand: Supervision Stand pivot transfers: Supervision       General transfer comment: no assist needed, no LOB and supervision for safety     Balance Overall balance assessment: Needs assistance Sitting-balance support: Feet supported Sitting balance-Leahy Scale: Good     Standing balance support: No upper extremity  supported;During functional activity Standing balance-Leahy Scale: Good Standing balance comment: Able to perform clothing manipulation and grooming in standing without LOB                            ADL either performed or assessed with clinical judgement   ADL Overall ADL's : Needs assistance/impaired     Grooming: Wash/dry hands;Wash/dry face;Brushing hair;Supervision/safety;Standing               Lower Body Dressing: Supervision/safety;Sit to/from stand Lower Body Dressing Details (indicate cue type and reason): Pt able to cross ankles over knees  Toilet Transfer: Supervision/safety;Ambulation;Comfort height toilet;RW   Toileting- Clothing Manipulation and Hygiene: Supervision/safety;Sit to/from stand       Functional mobility during ADLs: Supervision/safety;Rolling walker General ADL Comments: Pt requires supervision for safety      Vision       Perception     Praxis      Cognition Arousal/Alertness: Awake/alert Behavior During Therapy: WFL for tasks assessed/performed Overall Cognitive Status: Within Functional Limits for tasks assessed                                          Exercises     Shoulder Instructions       General Comments pt is now hopeful for discharge home     Pertinent Vitals/ Pain       Pain Assessment: No/denies pain  Home Living  Prior Functioning/Environment              Frequency  Min 2X/week        Progress Toward Goals  OT Goals(current goals can now be found in the care plan section)  Progress towards OT goals: Progressing toward goals  Acute Rehab OT Goals Patient Stated Goal: to go home  OT Goal Formulation: With patient Time For Goal Achievement: 01/06/17 ADL Goals Pt Will Perform Grooming: with modified independence;standing Pt Will Perform Upper Body Bathing: with set-up;sitting Pt Will Perform Lower Body Bathing:  with set-up;sit to/from stand Pt Will Perform Upper Body Dressing: with set-up;sitting Pt Will Perform Lower Body Dressing: with set-up;sit to/from stand Pt Will Transfer to Toilet: with modified independence;ambulating;regular height toilet;bedside commode Pt Will Perform Toileting - Clothing Manipulation and hygiene: with modified independence;sit to/from stand Additional ADL Goal #1: Pt will independently verbalize 2 energy conservation strategies to incorporate into ADLs.  Additional ADL Goal #2: Pt will complete bed mobility at supervision level to prepare for OOB ADLs.   Plan Discharge plan needs to be updated    Co-evaluation                 AM-PAC PT "6 Clicks" Daily Activity     Outcome Measure   Help from another person eating meals?: None Help from another person taking care of personal grooming?: A Little Help from another person toileting, which includes using toliet, bedpan, or urinal?: A Little Help from another person bathing (including washing, rinsing, drying)?: A Little Help from another person to put on and taking off regular upper body clothing?: A Little Help from another person to put on and taking off regular lower body clothing?: A Little 6 Click Score: 19    End of Session Equipment Utilized During Treatment: Rolling walker  OT Visit Diagnosis: Unsteadiness on feet (R26.81);History of falling (Z91.81)   Activity Tolerance Patient tolerated treatment well   Patient Left in chair;with call bell/phone within reach   Nurse Communication Mobility status        Time: 5183-4373 OT Time Calculation (min): 35 min  Charges: OT General Charges $OT Visit: 1 Procedure OT Treatments $Self Care/Home Management : 23-37 mins  [facility], OTR/L [phone]    Lucille Passy M 12/23/2016, 3:29 PM

## 2016-12-23 NOTE — Progress Notes (Signed)
Patient ID: [name], female   DOB: [birth year], 71 y.o.   MRN: [id number]  SICU Evening Rounds:  Hemodynamically stable on milrinone 0.125.  Foley removed today at 10:30 am and only voided 50 cc of clear urine this evening. Scan showed 495 cc. Will monitor for 12 hrs and if no further void or feels uncomfortable replace foley. Clot retention less likely since voided urine clear.

## 2016-12-23 NOTE — Progress Notes (Addendum)
[facility] DAILY ICU PROGRESS NOTE                   [place]            ,[place] [address]          [phone]   19 Days Post-Op Procedure(s) (LRB): CORONARY ARTERY BYPASS GRAFTING (CABG)x 4 WITH ENDOSCOPIC HARVESTING OF RIGHT SAPHENOUS VEIN (N/A) CLIPPING OF ATRIAL APPENDAGE USING ATRICURE 45 ATRICLIP (Left) INTRAOPERATIVE TRANSESOPHAGEAL ECHOCARDIOGRAM (N/A) MAZE PROCEDURE (N/A)  Total Length of Stay:  LOS: 26 days   Subjective: Patient states she feels so much better this am. She has already walked. Only complaint is yeast infection from antibiotic.  Objective: Vital signs in last 24 hours: Temp:  [93.6 F (34.2 C)-97.6 F (36.4 C)] 97.6 F (36.4 C) (05/22 0700) Pulse Rate:  [71-87] 85 (05/22 0700) Cardiac Rhythm: Normal sinus rhythm (05/22 0400) Resp:  [12-24] 13 (05/22 0700) BP: (99-143)/(49-87) 123/63 (05/22 0700) SpO2:  [91 %-100 %] 98 % (05/22 0700) Weight:  [80.2 kg (176 lb 12.9 oz)] 80.2 kg (176 lb 12.9 oz) (05/22 0500)  Filed Weights   12/20/16 0500 12/22/16 0500 12/23/16 0500  Weight: 84.7 kg (186 lb 11.7 oz) 83.5 kg (184 lb 1.6 oz) 80.2 kg (176 lb 12.9 oz)    Weight change: -3.307 kg (-7 lb 4.7 oz)      Intake/Output from previous day: 05/21 0701 - 05/22 0700 In: 717.4 [P.O.:480; I.V.:147.4; IV Piggyback:50] Out: 7510 [Urine:4125]  Intake/Output this shift: No intake/output data recorded.  Current Meds: Scheduled Meds: . amiodarone  200 mg Oral BID  . atorvastatin  40 mg Oral q1800  . Chlorhexidine Gluconate Cloth  6 each Topical Daily  . feeding supplement (ENSURE ENLIVE)  237 mL Oral BID BM  . folic acid  1 mg Oral Daily  . glipiZIDE  5 mg Oral Q breakfast  . hydrALAZINE  12.5 mg Oral Q8H  . insulin aspart  0-15 Units Subcutaneous TID WC  . mouth rinse  15 mL Mouth Rinse BID  . polyethylene glycol  17 g Oral Daily  . sorbitol  30 mL Oral q morning - 10a   Continuous Infusions: . cefTRIAXone (ROCEPHIN)  IV Stopped  (12/22/16 1016)  . milrinone 0.25 mcg/kg/min (12/23/16 0700)   PRN Meds:.acetaminophen, ALPRAZolam, ALPRAZolam, alum & mag hydroxide-simeth, bisacodyl **OR** bisacodyl, magnesium hydroxide, ondansetron (ZOFRAN) IV, oxyCODONE, sodium chloride flush, traMADol  General appearance: cooperative and no distress Neurologic: intact Heart: RRR Lungs: Slightly diminished on left and fine crackles on right Abdomen: Soft, non tender, bowel sounds present Extremities: Bilateral +1 LE edema Wounds: Clean and dry.  Lab Results: CBC:  Recent Labs  12/22/16 0423 12/23/16 0253  WBC 7.4 8.4  HGB 9.1* 9.6*  HCT 28.9* 31.0*  PLT 349 405*   BMET:   Recent Labs  12/22/16 0423 12/23/16 0253  NA 132* 132*  K 4.3 5.1  CL 94* 93*  CO2 30 28  GLUCOSE 83 117*  BUN 37* 38*  CREATININE 1.44* 1.65*  CALCIUM 8.2* 8.6*    CMET: Lab Results  Component Value Date   WBC 8.4 12/23/2016   HGB 9.6 (L) 12/23/2016   HCT 31.0 (L) 12/23/2016   PLT 405 (H) 12/23/2016   GLUCOSE 117 (H) 12/23/2016   CHOL 139 11/28/2016   TRIG 88 11/28/2016   HDL 33 (L) 11/28/2016   LDLCALC 88 11/28/2016   ALT 29 12/17/2016   AST 23 12/17/2016   NA 132 (  L) 12/23/2016   K 5.1 12/23/2016   CL 93 (L) 12/23/2016   CREATININE 1.65 (H) 12/23/2016   BUN 38 (H) 12/23/2016   CO2 28 12/23/2016   TSH 4.14 06/16/2016   INR 1.34 12/04/2016   HGBA1C 7.0 (H) 12/06/2016    PT/INR: No results for input(s): LABPROT, INR in the last 72 hours. Radiology: Dg Chest 1 View  Result Date: 12/22/2016 CLINICAL DATA:  S/P thoracentesis [id number], left side EXAM: CHEST 1 VIEW COMPARISON:  Chest radiograph 5-21 -18 FINDINGS: Interval reduction in pleural fluid following thoracentesis. No pneumothorax appreciated. Moderate LEFT effusion remains. RIGHT lung is clear. PICC line noted. IMPRESSION: 1. No appreciable pneumothorax following LEFT thoracentesis. 2. Interval reduction in pleural fluid with persistent moderate LEFT effusion. Electronically  Signed   By: Suzy Bouchard M.D.   On: 12/22/2016 12:19   Ir Thoracentesis Asp Pleural Space W/img Guide  Result Date: 12/22/2016 INDICATION: Patient status post CABG. Now with left-sided pleural effusion. Request is made for therapeutic left thoracentesis EXAM: ULTRASOUND GUIDED THERAPEUTIC LEFT THORACENTESIS MEDICATIONS: 10 mL 1% lidocaine COMPLICATIONS: None immediate. PROCEDURE: An ultrasound guided thoracentesis was thoroughly discussed with the patient and questions answered. The benefits, risks, alternatives and complications were also discussed. The patient understands and wishes to proceed with the procedure. Written consent was obtained. Ultrasound was performed to localize and mark an adequate pocket of fluid in the left chest. The area was then prepped and draped in the normal sterile fashion. 1% Lidocaine was used for local anesthesia. Under ultrasound guidance a Safe-T-centesis catheter was introduced. Thoracentesis was performed. The catheter was removed and a dressing applied. FINDINGS: A total of approximately 1.2 liters of amber fluid was removed. IMPRESSION: Successful ultrasound guided left therapeutic thoracentesis yielding 1.2 liters of pleural fluid. Read by:  Brynda Greathouse PA-C Electronically Signed   By: Markus Daft M.D.   On: 12/22/2016 15:24     Assessment/Plan: S/P Procedure(s) (LRB): CORONARY ARTERY BYPASS GRAFTING (CABG)x 4 WITH ENDOSCOPIC HARVESTING OF RIGHT SAPHENOUS VEIN (N/A) CLIPPING OF ATRIAL APPENDAGE USING ATRICURE 45 ATRICLIP (Left) INTRAOPERATIVE TRANSESOPHAGEAL ECHOCARDIOGRAM (N/A) MAZE PROCEDURE (N/A)  1. CV-Previous PAF. Remains in SR. Co ox increased this am to 70.9 %-on Milrinone drip-titration per heart failure. Amiodarone 200 mg bid and Hydralazine 12.5 mg tid (with parameters). NO BB with low output and no ACE/ARB as creatinine increased. 2. Pulmonary-On 2 liters of oxygen via [place]. S/p left thoracentesis yesterday with 1.2 L of fluid removed. Will  order CXR for am. Encourage incentive spirometer. 3. Volume overload-heart failure transitioning to oral Lasix 4. ABL anemia- H and H slightly increased to 9.6 and 31 5. AKI-creatinine slightly increased from 1.44 to 1.65 6. DM-CBGs 180/129/91. On Insulin and Glipizide 5 mg every am. 7. UTI-on Rocephin (day # 7) for E. Coli. and pyridium. Will discuss with surgeon if can stop Rochephin. 8. Monistat for yeast infection  ZIMMERMAN,DONIELLE M PA-C 12/23/2016 8:02 AM   I have seen and examined the patient and agree with the assessment and plan as outlined.  Would continue Rocephin until Foley catheter has been removed, which potentially could be today since lasix drip has been stopped.  Possibly ready for transfer to step down soon - will discuss w/ Heart Failure team.  Rexene Alberts, MD 12/23/2016 9:45 AM

## 2016-12-23 NOTE — Progress Notes (Signed)
[name] MD paged regarding patient's urine retention. Patient able to void 200 mL clear yellow urine. Follow up bladder scan revealed 500 mL still in bladder. MD okay with this as long as patient is voiding and isn't in discomfort. If patient begins to become unable to void or too uncomfortable, MD okay with indwelling catheter insertion. Will continue to monitor patient closely.   [name]

## 2016-12-23 NOTE — Progress Notes (Signed)
Physical Therapy Treatment Patient Details Name: [name] MRN: [id number] DOB: 09/26/1945 Today's Date: 12/23/2016    History of Present Illness 71 yo s/p NSTEMI with CABG 5/3. PMhx: CAD, CHF, VT, orthostatic hypotension, ICD.  Transferred to ICU on due to cardiogenic shock on 5/16 with lower HGBs.  Pt also noticed to have clots in her urine, so heparin stopped and CT of abdomen ordered which did not show any acute processes in the abdomen, but did show bil pleural effusions.      PT Comments    Patient continues to progress toward mobility goals. Pt able to recall precautions and maintain throughout session. Vitals WNL. Patient needs to practice stairs next session.      Follow Up Recommendations  SNF;Supervision/Assistance - 24 hour (HHPT if con't to progress and has 24/7 supervision )     Equipment Recommendations  None recommended by PT    Recommendations for Other Services       Precautions / Restrictions Precautions Precautions: Fall;Sternal Precaution Comments: Pt able to state all sternal precautions.  Recquires min cues to adhere to them during ADLs  Restrictions Weight Bearing Restrictions: Yes Other Position/Activity Restrictions: limited UE push/pull due to sternal precautions    Mobility  Bed Mobility Overal bed mobility: Needs Assistance Bed Mobility: Rolling;Sit to Sidelying Rolling: Supervision       Sit to sidelying: Supervision General bed mobility comments: cues for sequencing and technique to maintain sternal precautions  Transfers Overall transfer level: Needs assistance Equipment used: Rolling walker (2 wheeled) Transfers: Sit to/from [employer] Transfers Sit to Stand: Supervision Stand pivot transfers: Supervision       General transfer comment: no assist needed, no LOB and supervision for safety   Ambulation/Gait Ambulation/Gait assistance: Min guard Ambulation Distance (Feet): 250 Feet Assistive device: Rolling walker (2  wheeled) Gait Pattern/deviations: Step-through pattern;Decreased stride length Gait velocity: decreased   General Gait Details: cues for posture and to maintain sternal precautions   Stairs            Wheelchair Mobility    Modified Rankin (Stroke Patients Only)       Balance Overall balance assessment: Needs assistance Sitting-balance support: Feet supported Sitting balance-Leahy Scale: Good     Standing balance support: No upper extremity supported;During functional activity Standing balance-Leahy Scale: Good Standing balance comment: Able to perform clothing manipulation and grooming in standing without LOB                             Cognition Arousal/Alertness: Awake/alert Behavior During Therapy: WFL for tasks assessed/performed Overall Cognitive Status: Within Functional Limits for tasks assessed                                        Exercises      General Comments General comments (skin integrity, edema, etc.): pt is now hopeful for discharge home       Pertinent Vitals/Pain Pain Assessment: No/denies pain    Home Living                      Prior Function            PT Goals (current goals can now be found in the care plan section) Acute Rehab PT Goals Patient Stated Goal: to go home  Progress towards PT goals: Progressing toward goals  Frequency    Min 3X/week      PT Plan Current plan remains appropriate    Co-evaluation              AM-PAC PT "6 Clicks" Daily Activity  Outcome Measure  Difficulty turning over in bed (including adjusting bedclothes, sheets and blankets)?: A Little Difficulty moving from lying on back to sitting on the side of the bed? : A Little Difficulty sitting down on and standing up from a chair with arms (e.g., wheelchair, bedside commode, etc,.)?: A Little Help needed moving to and from a bed to chair (including a wheelchair)?: A Little Help needed walking in  hospital room?: A Little Help needed climbing 3-5 steps with a railing? : A Little 6 Click Score: 18    End of Session Equipment Utilized During Treatment: Gait belt Activity Tolerance: Patient tolerated treatment well Patient left: with call bell/phone within reach;in bed Nurse Communication: Mobility status PT Visit Diagnosis: Muscle weakness (generalized) (M62.81);Difficulty in walking, not elsewhere classified (R26.2);Pain     Time: 3754-3606 PT Time Calculation (min) (ACUTE ONLY): 26 min  Charges:  $Gait Training: 8-22 mins $Therapeutic Activity: 8-22 mins                    G Codes:       Earney Navy, PTA Pager: [phone]     Darliss Cheney 12/23/2016, 4:05 PM

## 2016-12-24 ENCOUNTER — Encounter (HOSPITAL_COMMUNITY): Payer: Self-pay | Admitting: General Practice

## 2016-12-24 ENCOUNTER — Inpatient Hospital Stay (HOSPITAL_COMMUNITY): Payer: PPO

## 2016-12-24 LAB — CBC
HEMATOCRIT: 30.3 % — AB (ref 36.0–46.0)
Hemoglobin: 9.6 g/dL — ABNORMAL LOW (ref 12.0–15.0)
MCH: 27.6 pg (ref 26.0–34.0)
MCHC: 31.7 g/dL (ref 30.0–36.0)
MCV: 87.1 fL (ref 78.0–100.0)
PLATELETS: 344 10*3/uL (ref 150–400)
RBC: 3.48 MIL/uL — ABNORMAL LOW (ref 3.87–5.11)
RDW: 16.3 % — AB (ref 11.5–15.5)
WBC: 7.5 10*3/uL (ref 4.0–10.5)

## 2016-12-24 LAB — GLUCOSE, CAPILLARY
GLUCOSE-CAPILLARY: 162 mg/dL — AB (ref 65–99)
Glucose-Capillary: 86 mg/dL (ref 65–99)
Glucose-Capillary: 190 mg/dL — ABNORMAL HIGH (ref 65–99)
Glucose-Capillary: 71 mg/dL (ref 65–99)

## 2016-12-24 LAB — BASIC METABOLIC PANEL
Anion gap: 8 (ref 5–15)
BUN: 33 mg/dL — AB (ref 6–20)
CO2: 27 mmol/L (ref 22–32)
Calcium: 8.7 mg/dL — ABNORMAL LOW (ref 8.9–10.3)
Chloride: 95 mmol/L — ABNORMAL LOW (ref 101–111)
Creatinine, Ser: 1.55 mg/dL — ABNORMAL HIGH (ref 0.44–1.00)
GFR calc Af Amer: 38 mL/min — ABNORMAL LOW (ref >60)
GFR, EST NON AFRICAN AMERICAN: 33 mL/min — AB (ref >60)
GLUCOSE: 120 mg/dL — AB (ref 65–99)
POTASSIUM: 4.4 mmol/L (ref 3.5–5.1)
Sodium: 130 mmol/L — ABNORMAL LOW (ref 135–145)

## 2016-12-24 LAB — COOXEMETRY PANEL
CARBOXYHEMOGLOBIN: 1.4 % (ref 0.5–1.5)
METHEMOGLOBIN: 1.1 % (ref 0.0–1.5)
O2 Saturation: 73.2 %
Total hemoglobin: 11.6 g/dL — ABNORMAL LOW (ref 12.0–16.0)

## 2016-12-24 MED ORDER — SODIUM CHLORIDE 0.9 % IV BOLUS (SEPSIS)
500.0000 mL | Freq: Once | INTRAVENOUS | Status: AC
  Administered 2016-12-24: 500 mL via INTRAVENOUS

## 2016-12-24 MED ORDER — SODIUM CHLORIDE 0.9 % IV BOLUS (SEPSIS)
250.0000 mL | Freq: Once | INTRAVENOUS | Status: AC
  Administered 2016-12-24: 250 mL via INTRAVENOUS

## 2016-12-24 NOTE — Progress Notes (Signed)
CTS PM rounds  Emotional nsr O2 99% RA Ready for stepdown care

## 2016-12-24 NOTE — Progress Notes (Signed)
Pt hypotensive and feeling light headed. Pt seated in chair. PA notified about change in pts blood pressure and symptoms. 500 ml NS bolus ordered. Will continue to monitor. Pt remain seated in chair.

## 2016-12-24 NOTE — Progress Notes (Signed)
CARDIAC REHAB PHASE I   Pt in recliner, getting ready to eat lunch, RN at bedside. Assisted RN to reposition pt in chair, pt states she is afraid to stand after this morning's episode. Pt stood with moderate assistance, returned to recliner. C/o mild dizziness, VSS. Pt tearful, emotional support given to pt. Pt declines ambulation at this time, states she walked once this morning. Encouraged additional ambulation today as tolerated. Pt in recliner, call bell within reach. Will follow-up tomorrow.   Lenna Sciara, RN, BSN 12/24/2016 2:05 PM

## 2016-12-24 NOTE — Progress Notes (Signed)
OT Cancellation Note  Patient Details Name: [name] MRN: [id number] DOB: November 01, 1945   Cancelled Treatment:    Reason Eval/Treat Not Completed: Medical issues which prohibited therapy.  Pt currently hypotensive.  Will check back.  Ala Capri [place], OTR/L [phone]   Lucille Passy M 12/24/2016, 11:18 AM

## 2016-12-24 NOTE — Progress Notes (Addendum)
  Paged with pressures in 80-90s with fatigue and mild lightheadedness.  CVP 3-4 this am and already got 40 mg po lasix.  Give 500 cc NS bolus.  Liberalize fluid intake.  OK to move to stepdown/2w circle still.     Legrand Como [address]" [place], PA-C 12/24/2016 11:20 AM   Agree.  Glori Bickers, MD  1:59 PM

## 2016-12-24 NOTE — Progress Notes (Signed)
CSW spoke with pt concerning PT recommendation for SNF.  Patient is not interested in rehab placement at this time and would like to return home with her dtr and grandson- states her dtr is home all day and can assist as needed.  CSW updated RNCM on pt preference to return home  CSW signing off- please reconsult if needed  Jorge Ny, [place] Social Worker [phone]

## 2016-12-24 NOTE — Progress Notes (Signed)
Unable to obtain pt temperature after multiple oral and axillary attempts. Pt refused suggestion of obtaining temperature rectally. Pt alert and oriented times 4. States that only her hands feel cold. Pt is warm to touch. Will continue to monitor.

## 2016-12-24 NOTE — Progress Notes (Signed)
Advanced Heart Failure Rounding Note   Subjective:    Transferred to ICU due to cardiogenic shock --> placed on norepi and milrinone. Initial CO-OX 34%.  EF 40-45% by echo   Off heparin due to hematuria. CT abd 5/18 reviewed personally. Shows no stones or masses.   S/p IR thoracentesis 12/22/16 with 1.2 L out.   Feels great today. Walked halls this morning with a little bit of fatigue but no SOB. Denies lightheadedness or dizziness.   Negative 900 cc yesterday. Weight stable.  IV lasix stopped.  Remains in NSR on amio po 200 mg BID.   Coox 73.2% this am on milrinone 0.125 mcg/kg/min. Hgb stable at 9.6  Objective:     Vital Signs:   Temp:  [95.9 F (35.5 C)-97.6 F (36.4 C)] 97.5 F (36.4 C) (05/23 0756) Pulse Rate:  [70-84] 71 (05/23 0700) Resp:  [12-25] 20 (05/23 0700) BP: (87-146)/(55-86) 87/55 (05/23 0700) SpO2:  [94 %-100 %] 100 % (05/23 0700) Weight:  [176 lb 5.9 oz (80 kg)] 176 lb 5.9 oz (80 kg) (05/23 0500) Last BM Date: 12/20/16  Weight change: Filed Weights   12/22/16 0500 12/23/16 0500 12/24/16 0500  Weight: 184 lb 1.6 oz (83.5 kg) 176 lb 12.9 oz (80.2 kg) 176 lb 5.9 oz (80 kg)    Intake/Output:   Intake/Output Summary (Last 24 hours) at 12/24/16 0836 Last data filed at 12/24/16 0700  Gross per 24 hour  Intake            843.6 ml  Output             1425 ml  Net           -581.4 ml    Physical Exam: General: Well appearing. No resp difficulty. HEENT: Normal Neck: supple. JVD 5-6. Carotids 2+ bilat; no bruits. No thyromegaly or nodule noted. Cor: PMI nondisplaced. RRR, No M/G/R noted Lungs: Slight crackles.  Slightly dull L base.  Abdomen: Soft, non-tender, distended, no HSM. No bruits or masses. +BS  Extremities: No cyanosis, clubbing, rash, Trace ankle edema with ted hose in place.   Neuro: Alert & orientedx3, cranial nerves grossly intact. moves all 4 extremities w/o difficulty. Affect pleasant   Telemetry: Personally reviewed, NSR 70-80s      Labs: Basic Metabolic Panel:  Recent Labs Lab 12/19/16 0400 12/20/16 0506 12/21/16 0513 12/22/16 0423 12/23/16 0253 12/24/16 0317  NA 133* 133* 136 132* 132* 130*  K 4.1 4.0 3.1* 4.3 5.1 4.4  CL 93* 94* 104 94* 93* 95*  CO2 29 30 25 30 28 27   GLUCOSE 185* 131* 94 83 117* 120*  BUN 47* 45* 34* 37* 38* 33*  CREATININE 1.69* 1.71* 1.12* 1.44* 1.65* 1.55*  CALCIUM 8.2* 8.4* 6.6* 8.2* 8.6* 8.7*  MG 2.7*  --   --   --   --   --     Liver Function Tests:  Recent Labs Lab 12/17/16 1211  AST 23  ALT 29  ALKPHOS 120  BILITOT 0.6  PROT 6.6  ALBUMIN 3.1*   No results for input(s): LIPASE, AMYLASE in the last 168 hours. No results for input(s): AMMONIA in the last 168 hours.  CBC:  Recent Labs Lab 12/21/16 0513 12/21/16 1048 12/21/16 2100 12/22/16 0423 12/23/16 0253 12/24/16 0317  WBC 7.7 10.2  --  7.4 8.4 7.5  HGB 6.6* 8.6* 9.9* 9.1* 9.6* 9.6*  HCT 21.6* 28.3* 31.8* 28.9* 31.0* 30.3*  MCV 87.1 87.6  --  87.6 87.8  87.1  PLT 279 390  --  349 405* 344   Cardiac Enzymes: No results for input(s): CKTOTAL, CKMB, CKMBINDEX, TROPONINI in the last 168 hours.  BNP: BNP (last 3 results)  Recent Labs  11/27/16 0159 12/17/16 1255  BNP 920.0* 1,062.6*   ProBNP (last 3 results) No results for input(s): PROBNP in the last 8760 hours.  Other results:  Imaging: Dg Chest 1 View  Result Date: 12/22/2016 CLINICAL DATA:  S/P thoracentesis [id number], left side EXAM: CHEST 1 VIEW COMPARISON:  Chest radiograph 5-21 -18 FINDINGS: Interval reduction in pleural fluid following thoracentesis. No pneumothorax appreciated. Moderate LEFT effusion remains. RIGHT lung is clear. PICC line noted. IMPRESSION: 1. No appreciable pneumothorax following LEFT thoracentesis. 2. Interval reduction in pleural fluid with persistent moderate LEFT effusion. Electronically Signed   By: Suzy Bouchard M.D.   On: 12/22/2016 12:19   Dg Chest Port 1 View  Result Date: 12/24/2016 CLINICAL DATA:   Shortness of breath. EXAM: PORTABLE CHEST 1 VIEW COMPARISON:  12/22/2016 . FINDINGS: Right PICC line in stable position. Cardiac pacer with lead tips in right atrium and right ventricle. Prior CABG. Left atrial appendage clip again noted. Stable cardiomegaly. Persistent basilar atelectasis. Interim improvement of left pleural effusion. No pneumothorax IMPRESSION: 1.  A pacer stable position.  Prior CABG.  Stable cardiomegaly. 2. Persistent basilar atelectasis. Interim improvement of left-sided pleural effusion. Electronically Signed   By: Marcello Moores  Register   On: 12/24/2016 08:16   Ir Thoracentesis Asp Pleural Space W/img Guide  Result Date: 12/22/2016 INDICATION: Patient status post CABG. Now with left-sided pleural effusion. Request is made for therapeutic left thoracentesis EXAM: ULTRASOUND GUIDED THERAPEUTIC LEFT THORACENTESIS MEDICATIONS: 10 mL 1% lidocaine COMPLICATIONS: None immediate. PROCEDURE: An ultrasound guided thoracentesis was thoroughly discussed with the patient and questions answered. The benefits, risks, alternatives and complications were also discussed. The patient understands and wishes to proceed with the procedure. Written consent was obtained. Ultrasound was performed to localize and mark an adequate pocket of fluid in the left chest. The area was then prepped and draped in the normal sterile fashion. 1% Lidocaine was used for local anesthesia. Under ultrasound guidance a Safe-T-centesis catheter was introduced. Thoracentesis was performed. The catheter was removed and a dressing applied. FINDINGS: A total of approximately 1.2 liters of amber fluid was removed. IMPRESSION: Successful ultrasound guided left therapeutic thoracentesis yielding 1.2 liters of pleural fluid. Read by:  Brynda Greathouse PA-C Electronically Signed   By: Markus Daft M.D.   On: 12/22/2016 15:24     Medications:     Scheduled Medications: . amiodarone  200 mg Oral BID  . atorvastatin  40 mg Oral q1800  .  Chlorhexidine Gluconate Cloth  6 each Topical Daily  . clotrimazole  1 Applicatorful Vaginal QHS  . feeding supplement (ENSURE ENLIVE)  237 mL Oral BID BM  . folic acid  1 mg Oral Daily  . furosemide  40 mg Oral Daily  . glipiZIDE  5 mg Oral Q breakfast  . insulin aspart  0-15 Units Subcutaneous TID WC  . losartan  25 mg Oral QHS  . polyethylene glycol  17 g Oral Daily    Infusions: . cefTRIAXone (ROCEPHIN)  IV Stopped (12/23/16 0931)    PRN Medications: acetaminophen, ALPRAZolam, ALPRAZolam, alum & mag hydroxide-simeth, bisacodyl **OR** bisacodyl, magnesium hydroxide, miconazole, ondansetron (ZOFRAN) IV, oxyCODONE, sodium chloride flush, traMADol   Assessment/Plan/Discussion     1. Acute on chronic systolic HF -> Cardiogenic Shock - Co-ox 73.2% on milrinone 0.125  mcg/kg/min. Will stop and follow coox in am.  - Volume status stable.  Transition to lasix 40 mg daily. CVP 5-6  - BP dropped with addition of hydralazine. On hold for now.  - No ACE/ARB with CKD. No b-blocker with low output. 2.  Left pleural effusion - s/p Thoracentesis 12/22/16 with 1.2 L out.  - CXR afterwards with mild improvement. Images reviewed personally.  3. 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  - Surgery following. Stable.   3. PAF  - remains in NSR on po amio 200 mg BID.  - Off heparin due to hematuria. No change.  4. Hematuria - CT ab negative. Has Foley for irrigation. - Received 1 UPRBC 12/21/16 with improvement of Hgb. (Total of 3 UPRBCs thus far) - No further.  5. AKI: - Creatinine 1.1 -> 1.4 -> 1.6. -> 155 6. UTI: -  On bactrim 5/14 through 5/16. Switched to rocephin 5/16 . Urine CX + EColi  - Blood cultures NGTD - Continue pyridium.  No change. Having some mild urinary retention.  7. Deconditioning - Continue to mobilize. Needs CR.  8. Symptomatic anemia - Improved s/p transfusion 12/21/16   - Stable today. No further bleeding.     Transfer to 2W.   Length of Stay: [place], [place]  Advanced Heart Failure Team Pager [phone] (M-F; 7a - 4p)  Please contact [facility] for night-coverage after hours (4p -7a ) and weekends on [email or web address]  Patient seen and examined with the above-signed Advanced Practice Provider and/or Housestaff. I personally reviewed laboratory data, imaging studies and relevant notes. I independently examined the patient and formulated the important aspects of the plan. I have edited the note to reflect any of my changes or salient points. I have personally discussed the plan with the patient and/or family.  Continues to improve. Breathing better after thoracentesis. CXR improved.  Co-ox looks good can stop milrinone. CVP low. Will hold diuretics.   Maintaining NSR on amio. No AC due to severe hematuria.  Can go to 2W  Glori Bickers, MD  2:01 PM

## 2016-12-24 NOTE — Progress Notes (Addendum)
TCTS DAILY ICU PROGRESS NOTE                   [address]            [place] [id number]          [phone]   20 Days Post-Op Procedure(s) (LRB): CORONARY ARTERY BYPASS GRAFTING (CABG)x 4 WITH ENDOSCOPIC HARVESTING OF RIGHT SAPHENOUS VEIN (N/A) CLIPPING OF ATRIAL APPENDAGE USING ATRICURE 45 ATRICLIP (Left) INTRAOPERATIVE TRANSESOPHAGEAL ECHOCARDIOGRAM (N/A) MAZE PROCEDURE (N/A)  Total Length of Stay:  LOS: 27 days   Subjective: Patient states able to void a little at a time. She is not uncomfortable despite incomplete evacuation.  Objective: Vital signs in last 24 hours: Temp:  [95.9 F (35.5 C)-97.6 F (36.4 C)] 97.5 F (36.4 C) (05/23 0756) Pulse Rate:  [70-84] 71 (05/23 0700) Cardiac Rhythm: Normal sinus rhythm (05/23 0400) Resp:  [12-25] 20 (05/23 0700) BP: (87-146)/(55-86) 87/55 (05/23 0700) SpO2:  [94 %-100 %] 100 % (05/23 0700) Weight:  [80 kg (176 lb 5.9 oz)] 80 kg (176 lb 5.9 oz) (05/23 0500)  Filed Weights   12/22/16 0500 12/23/16 0500 12/24/16 0500  Weight: 83.5 kg (184 lb 1.6 oz) 80.2 kg (176 lb 12.9 oz) 80 kg (176 lb 5.9 oz)    Weight change: -0.2 kg (-7.1 oz)      Intake/Output from previous day: 05/22 0701 - 05/23 0700 In: 850 [P.O.:720; I.V.:80; IV Piggyback:50] Out: 2426 [Urine:1775]  Intake/Output this shift: No intake/output data recorded.  Current Meds: Scheduled Meds: . amiodarone  200 mg Oral BID  . atorvastatin  40 mg Oral q1800  . Chlorhexidine Gluconate Cloth  6 each Topical Daily  . clotrimazole  1 Applicatorful Vaginal QHS  . feeding supplement (ENSURE ENLIVE)  237 mL Oral BID BM  . folic acid  1 mg Oral Daily  . furosemide  40 mg Oral Daily  . glipiZIDE  5 mg Oral Q breakfast  . insulin aspart  0-15 Units Subcutaneous TID WC  . losartan  25 mg Oral QHS  . polyethylene glycol  17 g Oral Daily   Continuous Infusions: . cefTRIAXone (ROCEPHIN)  IV Stopped (12/23/16 0931)  . milrinone 0.125 mcg/kg/min  (12/24/16 0700)   PRN Meds:.acetaminophen, ALPRAZolam, ALPRAZolam, alum & mag hydroxide-simeth, bisacodyl **OR** bisacodyl, magnesium hydroxide, miconazole, ondansetron (ZOFRAN) IV, oxyCODONE, sodium chloride flush, traMADol  General appearance: cooperative and no distress Neurologic: intact Heart: RRR Lungs: Slightly diminished on left and fine crackles on right Abdomen: Soft, non tender, bowel sounds present Extremities: Bilateral +1 LE edema Wounds: Clean and dry.  Lab Results: CBC:  Recent Labs  12/23/16 0253 12/24/16 0317  WBC 8.4 7.5  HGB 9.6* 9.6*  HCT 31.0* 30.3*  PLT 405* 344   BMET:   Recent Labs  12/23/16 0253 12/24/16 0317  NA 132* 130*  K 5.1 4.4  CL 93* 95*  CO2 28 27  GLUCOSE 117* 120*  BUN 38* 33*  CREATININE 1.65* 1.55*  CALCIUM 8.6* 8.7*    CMET: Lab Results  Component Value Date   WBC 7.5 12/24/2016   HGB 9.6 (L) 12/24/2016   HCT 30.3 (L) 12/24/2016   PLT 344 12/24/2016   GLUCOSE 120 (H) 12/24/2016   CHOL 139 11/28/2016   TRIG 88 11/28/2016   HDL 33 (L) 11/28/2016   LDLCALC 88 11/28/2016   ALT 29 12/17/2016   AST 23 12/17/2016   NA 130 (L) 12/24/2016   K 4.4 12/24/2016   CL  95 (L) 12/24/2016   CREATININE 1.55 (H) 12/24/2016   BUN 33 (H) 12/24/2016   CO2 27 12/24/2016   TSH 4.14 06/16/2016   INR 1.34 12/04/2016   HGBA1C 7.0 (H) 12/06/2016    PT/INR: No results for input(s): LABPROT, INR in the last 72 hours. Radiology: Dg Chest Port 1 View  Result Date: 12/24/2016 CLINICAL DATA:  Shortness of breath. EXAM: PORTABLE CHEST 1 VIEW COMPARISON:  12/22/2016 . FINDINGS: Right PICC line in stable position. Cardiac pacer with lead tips in right atrium and right ventricle. Prior CABG. Left atrial appendage clip again noted. Stable cardiomegaly. Persistent basilar atelectasis. Interim improvement of left pleural effusion. No pneumothorax IMPRESSION: 1.  A pacer stable position.  Prior CABG.  Stable cardiomegaly. 2. Persistent basilar  atelectasis. Interim improvement of left-sided pleural effusion. Electronically Signed   By: Marcello Moores  Register   On: 12/24/2016 08:16     Assessment/Plan: S/P Procedure(s) (LRB): CORONARY ARTERY BYPASS GRAFTING (CABG)x 4 WITH ENDOSCOPIC HARVESTING OF RIGHT SAPHENOUS VEIN (N/A) CLIPPING OF ATRIAL APPENDAGE USING ATRICURE 45 ATRICLIP (Left) INTRAOPERATIVE TRANSESOPHAGEAL ECHOCARDIOGRAM (N/A) MAZE PROCEDURE (N/A)  1. CV-Previous PAF. Remains in SR. Co ox increased this am to 73.2 %. Milrinone drip stopped this am. Check co ox in am. Amiodarone 200 mg bid and Losartan 25 mg at hs. 2. Pulmonary-On 2 liters of oxygen via [place]. S/p left thoracentesis yesterday with 1.2 L of fluid removed. Will order CXR for am. Encourage incentive spirometer. 3. Volume overload-on Lasix 40 mg daily 4. ABL anemia- H and H stable 9.6 and 30.3 5. AKI-creatinine slightly decreased from 1.65 to 1.55 6. DM-CBGs 180/129/91. On Insulin and Glipizide 5 mg every am. 7. UTI-on Rocephin (day # 8) for E. Coli. and pyridium. Will likely stop Rochephin after today's dose . 8. Urinary retention-foley removed yesterday. She is voiding, but incomplete. Continue to monitor and hopefully can avoid foley reinsertion. 9. Clotrimazole cream for yeast infection (day # 2/7) 10. Transfer to floor  Arnoldo Lenis 12/24/2016 8:29 AM   Patient seen and chest x-ray reviewed Improved left pleural effusion after left thoracentesis Agree with plan to transfer to stepdown for further rehabilitation and titration of medications Hold anticoagulation because of urinary tract hemorrhage Stop Rocephin tomorrow now that Foley catheter is out [employer] trigt M.D.

## 2016-12-25 LAB — CBC
HEMATOCRIT: 31.2 % — AB (ref 36.0–46.0)
HEMOGLOBIN: 9.5 g/dL — AB (ref 12.0–15.0)
MCH: 26.8 pg (ref 26.0–34.0)
MCHC: 30.4 g/dL (ref 30.0–36.0)
MCV: 88.1 fL (ref 78.0–100.0)
PLATELETS: 359 10*3/uL (ref 150–400)
RBC: 3.54 MIL/uL — AB (ref 3.87–5.11)
RDW: 16.3 % — ABNORMAL HIGH (ref 11.5–15.5)
WBC: 7.4 10*3/uL (ref 4.0–10.5)

## 2016-12-25 LAB — BASIC METABOLIC PANEL
ANION GAP: 7 (ref 5–15)
BUN: 40 mg/dL — AB (ref 6–20)
CO2: 25 mmol/L (ref 22–32)
Calcium: 8.3 mg/dL — ABNORMAL LOW (ref 8.9–10.3)
Chloride: 101 mmol/L (ref 101–111)
Creatinine, Ser: 1.73 mg/dL — ABNORMAL HIGH (ref 0.44–1.00)
GFR calc Af Amer: 34 mL/min — ABNORMAL LOW (ref >60)
GFR, EST NON AFRICAN AMERICAN: 29 mL/min — AB (ref >60)
GLUCOSE: 71 mg/dL (ref 65–99)
Potassium: 4.7 mmol/L (ref 3.5–5.1)
SODIUM: 133 mmol/L — AB (ref 135–145)

## 2016-12-25 LAB — COOXEMETRY PANEL
CARBOXYHEMOGLOBIN: 1.8 % — AB (ref 0.5–1.5)
METHEMOGLOBIN: 0.9 % (ref 0.0–1.5)
O2 Saturation: 59.8 %
Total hemoglobin: 10.3 g/dL — ABNORMAL LOW (ref 12.0–16.0)

## 2016-12-25 LAB — GLUCOSE, CAPILLARY
GLUCOSE-CAPILLARY: 135 mg/dL — AB (ref 65–99)
GLUCOSE-CAPILLARY: 120 mg/dL — AB (ref 65–99)
Glucose-Capillary: 80 mg/dL (ref 65–99)
Glucose-Capillary: 70 mg/dL (ref 65–99)

## 2016-12-25 NOTE — Progress Notes (Signed)
CARDIAC REHAB PHASE I   PRE:  Rate/Rhythm: 21 SR  BP:  Supine:   Sitting: 125/68  Standing:    SaO2: 100%RA  MODE:  Ambulation: 270 ft   POST:  Rate/Rhythm: 74 SR  BP:  Supine:   Sitting: 165/88  Standing:    SaO2: 100%Ra 1358-1435 Pt assisted to bathroom and then walked 270 ft on RA with rolling walker and asst x 1. Feeling so much better today. Tolerated well. To recliner with call bell.   Graylon Good, RN BSN  12/25/2016 2:31 PM

## 2016-12-25 NOTE — Progress Notes (Signed)
Occupational Therapy Treatment Patient Details Name: [name] MRN: [id number] DOB: 12/02/1945 Today's Date: 12/25/2016    History of present illness 71 yo s/p NSTEMI with CABG 5/3. PMhx: CAD, CHF, VT, orthostatic hypotension, ICD.  Transferred to ICU on due to cardiogenic shock on 5/16 with lower HGBs.  Pt also noticed to have clots in her urine, so heparin stopped and CT of abdomen ordered which did not show any acute processes in the abdomen, but did show bil pleural effusions.     OT comments  Pt reports feeling great. Worked on sit to stand from lower surface of chair, toileting, standing grooming and LB dressing. Pt educated in energy conservation and sternal precautions related to ADL and IADL. Pt continues to be appropriate for d/c home with [facility]. She reports her daughter does not work and can provide 24 hour care.  Follow Up Recommendations  Home health OT;Supervision/Assistance - 24 hour    Equipment Recommendations  3 in 1 bedside commode    Recommendations for Other Services      Precautions / Restrictions Precautions Precautions: Fall;Sternal Precaution Comments: Pt able to state all sternal precautions, assured pt it was ok to reach to top of her head Restrictions Weight Bearing Restrictions: No       Mobility Bed Mobility               General bed mobility comments: pt in chair  Transfers Overall transfer level: Needs assistance Equipment used: Rolling walker (2 wheeled) Transfers: Sit to/from Stand Sit to Stand: Min assist         General transfer comment: needed moderate assist from recliner initially, instructed pt to place her hands on her knees and use momentum and then required min guard assist    Balance Overall balance assessment: Needs assistance   Sitting balance-Leahy Scale: Good       Standing balance-Leahy Scale: Fair                             ADL either performed or assessed with clinical judgement   ADL  Overall ADL's : Needs assistance/impaired     Grooming: Wash/dry hands;Standing;Supervision/safety           Upper Body Dressing : Set up;Sitting   Lower Body Dressing: Supervision/safety;Sit to/from stand Lower Body Dressing Details (indicate cue type and reason): Pt able to cross ankles over knees  Toilet Transfer: Supervision/safety;Ambulation;Comfort height toilet;RW   Toileting- Clothing Manipulation and Hygiene: Supervision/safety;Sit to/from stand       Functional mobility during ADLs: Supervision/safety;Rolling walker General ADL Comments: supervision for safety and multiple lines, educated pt in pacing, planning and energy conservation     Vision       Perception     Praxis      Cognition Arousal/Alertness: Awake/alert Behavior During Therapy: WFL for tasks assessed/performed Overall Cognitive Status: Within Functional Limits for tasks assessed                                          Exercises     Shoulder Instructions       General Comments      Pertinent Vitals/ Pain       Faces Pain Scale: No hurt  Home Living  Prior Functioning/Environment              Frequency  Min 2X/week        Progress Toward Goals  OT Goals(current goals can now be found in the care plan section)  Progress towards OT goals: Progressing toward goals  Acute Rehab OT Goals Patient Stated Goal: to go home  OT Goal Formulation: With patient Time For Goal Achievement: 01/06/17  Plan Discharge plan remains appropriate    Co-evaluation                 AM-PAC PT "6 Clicks" Daily Activity     Outcome Measure   Help from another person eating meals?: None Help from another person taking care of personal grooming?: A Little Help from another person toileting, which includes using toliet, bedpan, or urinal?: A Lot Help from another person bathing (including washing, rinsing,  drying)?: A Little Help from another person to put on and taking off regular upper body clothing?: None Help from another person to put on and taking off regular lower body clothing?: A Little 6 Click Score: 19    End of Session Equipment Utilized During Treatment: Gait belt;Rolling walker  OT Visit Diagnosis: Unsteadiness on feet (R26.81);History of falling (Z91.81)   Activity Tolerance Patient tolerated treatment well   Patient Left in chair;with call bell/phone within reach   Nurse Communication          Time: 0929-5747 OT Time Calculation (min): 38 min  Charges: OT General Charges $OT Visit: 1 Procedure OT Treatments $Self Care/Home Management : 38-52 mins     Malka So 12/25/2016, 3:20 PM  [phone]

## 2016-12-25 NOTE — Progress Notes (Signed)
PT Cancellation Note  Patient Details Name: [name] MRN: [id number] DOB: Dec 17, 1945   Cancelled Treatment:    Reason Eval/Treat Not Completed: Patient declined, no reason specified Patient declined mobility (despite max encouragement) and reported just working with OT and cardiac rehab. PT will continue to follow acutely.    Salina April, PTA Pager: [phone]   12/25/2016, 3:33 PM

## 2016-12-25 NOTE — Progress Notes (Signed)
[facility] DAILY ICU PROGRESS NOTE                   [address]            [place] [address]          [phone]   21 Days Post-Op Procedure(s) (LRB): CORONARY ARTERY BYPASS GRAFTING (CABG)x 4 WITH ENDOSCOPIC HARVESTING OF RIGHT SAPHENOUS VEIN (N/A) CLIPPING OF ATRIAL APPENDAGE USING ATRICURE 45 ATRICLIP (Left) INTRAOPERATIVE TRANSESOPHAGEAL ECHOCARDIOGRAM (N/A) MAZE PROCEDURE (N/A)  Total Length of Stay:  LOS: 28 days   Subjective: Patient states able to void a little at a time. She is not uncomfortable despite incomplete evacuation.  Objective: Vital signs in last 24 hours: Temp:  [94.5 F (34.7 C)-98.2 F (36.8 C)] 98.2 F (36.8 C) (05/24 0700) Pulse Rate:  [62-72] 70 (05/24 0900) Cardiac Rhythm: Normal sinus rhythm (05/24 0900) BP: (60-135)/(35-81) 120/53 (05/24 0900) SpO2:  [98 %-100 %] 100 % (05/24 0900)  Filed Weights   12/22/16 0500 12/23/16 0500 12/24/16 0500  Weight: 83.5 kg (184 lb 1.6 oz) 80.2 kg (176 lb 12.9 oz) 80 kg (176 lb 5.9 oz)    Weight change:    CVP:  [10 mmHg] 10 mmHg  Intake/Output from previous day: 05/23 0701 - 05/24 0700 In: 309.3 [I.V.:9.3; IV Piggyback:300] Out: -   Intake/Output this shift: Total I/O In: 290 [P.O.:240; IV Piggyback:50] Out: -   Current Meds: Scheduled Meds: . amiodarone  200 mg Oral BID  . atorvastatin  40 mg Oral q1800  . Chlorhexidine Gluconate Cloth  6 each Topical Daily  . clotrimazole  1 Applicatorful Vaginal QHS  . feeding supplement (ENSURE ENLIVE)  237 mL Oral BID BM  . folic acid  1 mg Oral Daily  . glipiZIDE  5 mg Oral Q breakfast  . insulin aspart  0-15 Units Subcutaneous TID WC  . polyethylene glycol  17 g Oral Daily   Continuous Infusions: . cefTRIAXone (ROCEPHIN)  IV 1 g (12/25/16 0935)   PRN Meds:.acetaminophen, ALPRAZolam, ALPRAZolam, alum & mag hydroxide-simeth, bisacodyl **OR** bisacodyl, magnesium hydroxide, miconazole, ondansetron (ZOFRAN) IV, oxyCODONE, sodium chloride  flush, traMADol  General appearance: cooperative and no distress Neurologic: intact Heart: RRR Lungs: Slightly diminished on left and fine crackles on right Abdomen: Soft, non tender, bowel sounds present Extremities: Bilateral +1 LE edema Wounds: Clean and dry.  Lab Results: CBC:  Recent Labs  12/24/16 0317 12/25/16 0554  WBC 7.5 7.4  HGB 9.6* 9.5*  HCT 30.3* 31.2*  PLT 344 359   BMET:   Recent Labs  12/24/16 0317 12/25/16 0554  NA 130* 133*  K 4.4 4.7  CL 95* 101  CO2 27 25  GLUCOSE 120* 71  BUN 33* 40*  CREATININE 1.55* 1.73*  CALCIUM 8.7* 8.3*    CMET: Lab Results  Component Value Date   WBC 7.4 12/25/2016   HGB 9.5 (L) 12/25/2016   HCT 31.2 (L) 12/25/2016   PLT 359 12/25/2016   GLUCOSE 71 12/25/2016   CHOL 139 11/28/2016   TRIG 88 11/28/2016   HDL 33 (L) 11/28/2016   LDLCALC 88 11/28/2016   ALT 29 12/17/2016   AST 23 12/17/2016   NA 133 (L) 12/25/2016   K 4.7 12/25/2016   CL 101 12/25/2016   CREATININE 1.73 (H) 12/25/2016   BUN 40 (H) 12/25/2016   CO2 25 12/25/2016   TSH 4.14 06/16/2016   INR 1.34 12/04/2016   HGBA1C 7.0 (H) 12/06/2016    PT/INR:  No results for input(s): LABPROT, INR in the last 72 hours. Radiology: No results found.   Assessment/Plan: S/P Procedure(s) (LRB): CORONARY ARTERY BYPASS GRAFTING (CABG)x 4 WITH ENDOSCOPIC HARVESTING OF RIGHT SAPHENOUS VEIN (N/A) CLIPPING OF ATRIAL APPENDAGE USING ATRICURE 45 ATRICLIP (Left) INTRAOPERATIVE TRANSESOPHAGEAL ECHOCARDIOGRAM (N/A) MAZE PROCEDURE (N/A)  1. CV-Previous PAF. Remains in SR. Co ox  this am decreased to 59.8 %. Has been off Milrinone drip since yesterday. On Amiodarone 200 mg bid. Patient requesting Amiodarone be stopped-I will discuss with heart failure if able to at least decrease to daily. 2. Pulmonary-On room air. S/p left thoracentesis 05/21 with 1.2 L of fluid removed.  Encourage incentive spirometer. 3. Volume status appears stable. Per heart failure, no Lasix  today. 4. ABL anemia- H and H stable 9.5 and 31.2 5. AKI-creatinine slightly decreased from 1.55 to 1.73. Not on Lasix or Losartan anymore. Monitor.  6. DM-CBGs 71/162/80. On Insulin and Glipizide 5 mg every am. 7. UTI-on Rocephin (day # 9) for E. Coli.  Will stop Rochephin today. 8. Urinary retention-foley removed yesterday. She is voiding, but incomplete. Continue to monitor and hopefully can avoid foley reinsertion. 9. Clotrimazole cream for yeast infection (day # 3/7) 10. Previous hematuria-Xarelto and Heparin stopped days ago. 11. Transfer to floor  Bartosz Luginbill M PA-C 12/25/2016 9:43 AM

## 2016-12-25 NOTE — Progress Notes (Signed)
Report called to [place] transferred to 2w21 VSS.  Lucius Conn, RN

## 2016-12-25 NOTE — Progress Notes (Signed)
Advanced Heart Failure Rounding Note   Subjective:    Transferred to ICU due to cardiogenic shock --> placed on norepi and milrinone. Initial CO-OX 34%.  EF 40-45% by echo   Off heparin due to hematuria. CT abd 5/18 reviewed personally. Shows no stones or masses.   S/p IR thoracentesis 12/22/16 with 1.2 L out.   Feeling better today with IVF resuscitation and holding Losartan.   I/O positive. No weight this am.  Remains in NSR on amio po 200 mg BID.   Coox 59.8% off milrinone. Hgb stable at 9.5  Objective:     Vital Signs:   Temp:  [94.5 F (34.7 C)-98.2 F (36.8 C)] 98.2 F (36.8 C) (05/24 0700) Pulse Rate:  [62-72] 70 (05/24 0900) BP: (60-135)/(35-81) 120/53 (05/24 0900) SpO2:  [98 %-100 %] 100 % (05/24 0900) Last BM Date: 12/20/16  Weight change: Filed Weights   12/22/16 0500 12/23/16 0500 12/24/16 0500  Weight: 184 lb 1.6 oz (83.5 kg) 176 lb 12.9 oz (80.2 kg) 176 lb 5.9 oz (80 kg)    Intake/Output:   Intake/Output Summary (Last 24 hours) at 12/25/16 0938 Last data filed at 12/24/16 1650  Gross per 24 hour  Intake              300 ml  Output                0 ml  Net              300 ml    Physical Exam: General: Well appearing. No resp difficulty. HEENT: normal Neck: supple. JVD 6-7. Carotids 2+ bilat; no bruits. No thyromegaly or nodule noted. Cor: PMI nondisplaced. RRR, No M/G/R noted Lungs: CTAB, normal effort. Abdomen: soft, non-tender, distended, no HSM. No bruits or masses. +BS  Extremities: no cyanosis, clubbing, rash, R and LLE no edema.  Neuro: alert & orientedx3, cranial nerves grossly intact. moves all 4 extremities w/o difficulty. Affect pleasant   Telemetry: Personally reviewed, NSR 70-80s    Labs: Basic Metabolic Panel:  Recent Labs Lab 12/19/16 0400  12/21/16 0513 12/22/16 0423 12/23/16 0253 12/24/16 0317 12/25/16 0554  NA 133*  < > 136 132* 132* 130* 133*  K 4.1  < > 3.1* 4.3 5.1 4.4 4.7  CL 93*  < > 104 94* 93* 95* 101    CO2 29  < > 25 30 28 27 25   GLUCOSE 185*  < > 94 83 117* 120* 71  BUN 47*  < > 34* 37* 38* 33* 40*  CREATININE 1.69*  < > 1.12* 1.44* 1.65* 1.55* 1.73*  CALCIUM 8.2*  < > 6.6* 8.2* 8.6* 8.7* 8.3*  MG 2.7*  --   --   --   --   --   --   < > = values in this interval not displayed.  Liver Function Tests: No results for input(s): AST, ALT, ALKPHOS, BILITOT, PROT, ALBUMIN in the last 168 hours. No results for input(s): LIPASE, AMYLASE in the last 168 hours. No results for input(s): AMMONIA in the last 168 hours.  CBC:  Recent Labs Lab 12/21/16 1048 12/21/16 2100 12/22/16 0423 12/23/16 0253 12/24/16 0317 12/25/16 0554  WBC 10.2  --  7.4 8.4 7.5 7.4  HGB 8.6* 9.9* 9.1* 9.6* 9.6* 9.5*  HCT 28.3* 31.8* 28.9* 31.0* 30.3* 31.2*  MCV 87.6  --  87.6 87.8 87.1 88.1  PLT 390  --  349 405* 344 359   Cardiac Enzymes: No results for  input(s): CKTOTAL, CKMB, CKMBINDEX, TROPONINI in the last 168 hours.  BNP: BNP (last 3 results)  Recent Labs  11/27/16 0159 12/17/16 1255  BNP 920.0* 1,062.6*   ProBNP (last 3 results) No results for input(s): PROBNP in the last 8760 hours.  Other results:  Imaging: Dg Chest Port 1 View  Result Date: 12/24/2016 CLINICAL DATA:  Shortness of breath. EXAM: PORTABLE CHEST 1 VIEW COMPARISON:  12/22/2016 . FINDINGS: Right PICC line in stable position. Cardiac pacer with lead tips in right atrium and right ventricle. Prior CABG. Left atrial appendage clip again noted. Stable cardiomegaly. Persistent basilar atelectasis. Interim improvement of left pleural effusion. No pneumothorax IMPRESSION: 1.  A pacer stable position.  Prior CABG.  Stable cardiomegaly. 2. Persistent basilar atelectasis. Interim improvement of left-sided pleural effusion. Electronically Signed   By: [place]   On: 12/24/2016 08:16     Medications:     Scheduled Medications: . amiodarone  200 mg Oral BID  . atorvastatin  40 mg Oral q1800  . Chlorhexidine Gluconate Cloth  6  each Topical Daily  . clotrimazole  1 Applicatorful Vaginal QHS  . feeding supplement (ENSURE ENLIVE)  237 mL Oral BID BM  . folic acid  1 mg Oral Daily  . glipiZIDE  5 mg Oral Q breakfast  . insulin aspart  0-15 Units Subcutaneous TID WC  . polyethylene glycol  17 g Oral Daily    Infusions: . cefTRIAXone (ROCEPHIN)  IV 1 g (12/25/16 0935)    PRN Medications: acetaminophen, ALPRAZolam, ALPRAZolam, alum & mag hydroxide-simeth, bisacodyl **OR** bisacodyl, magnesium hydroxide, miconazole, ondansetron (ZOFRAN) IV, oxyCODONE, sodium chloride flush, traMADol   Assessment/Plan/Discussion     1. Acute on chronic systolic HF -> Cardiogenic Shock - Co-ox 59.8% OFF milrinone. Keep off.   - Volume status stable to dry. Hold lasix today.  - Pressures remain soft but improved. Could try and gently add 12.5 mg losartan at bedtime, but creatinine up. May need to wait until tomorrow.  - No BB with soft mixed venous sat.  2.  Left pleural effusion - s/p Thoracentesis 12/22/16 with 1.2 L out.  - CXR afterwards with mild improvement. Images reviewed personally.  Stable. No change.  3. 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  - Surgery following. Stable. No change.  3. PAF  - remains in NSR on po amio 200 mg BID.  - Off heparin due to hematuria. No change. Tolerating amio.  4. Hematuria - CT ab negative. Has Foley for irrigation. - Received 1 UPRBC 12/21/16 with improvement of Hgb. (Total of 3 UPRBCs thus far) - No further.  5. AKI: - Creatinine 1.1 -> 1.4 -> 1.6. -> 1.55 -> 1.73. Holding diuretics.  6. UTI: -  On bactrim 5/14 through 5/16. Switched to rocephin 5/16 . Urine CX + EColi  - Blood cultures NGTD - Continue pyridium. No change. Has had some mild urinary retention.  7. Deconditioning - Continue to mobilize. Cardiac rehab following.   8. Symptomatic anemia - Improved s/p transfusion 12/21/16   - Stable Hgb. No bleeding.    Transfer to 2W.   Length of Stay: [place], [place]  Advanced Heart Failure Team Pager [phone] (M-F; 7a - 4p)  Please contact [facility] for night-coverage after hours (4p -7a ) and weekends on [email or web address]  Patient seen and examined with the above-signed Advanced Practice Provider and/or Housestaff. I personally reviewed laboratory data, imaging  studies and relevant notes. I independently examined the patient and formulated the important aspects of the plan. I have edited the note to reflect any of my changes or salient points. I have personally discussed the plan with the patient and/or family.  Doing well. Feels better this am. Volume status looks ok. Co-ox marginal off milrinone. Renal function slightly worse. Would hold diuretics. Watch co-ox closely. Can go to 2W. Continue amio for AF. No AC due to hematuria.   Glori Bickers, MD  1:40 PM

## 2016-12-26 LAB — CBC
HCT: 31.2 % — ABNORMAL LOW (ref 36.0–46.0)
HEMOGLOBIN: 9.7 g/dL — AB (ref 12.0–15.0)
MCH: 27.5 pg (ref 26.0–34.0)
MCHC: 31.1 g/dL (ref 30.0–36.0)
MCV: 88.4 fL (ref 78.0–100.0)
PLATELETS: 357 10*3/uL (ref 150–400)
RBC: 3.53 MIL/uL — ABNORMAL LOW (ref 3.87–5.11)
RDW: 16.5 % — AB (ref 11.5–15.5)
WBC: 7.8 10*3/uL (ref 4.0–10.5)

## 2016-12-26 LAB — GLUCOSE, CAPILLARY
GLUCOSE-CAPILLARY: 126 mg/dL — AB (ref 65–99)
GLUCOSE-CAPILLARY: 179 mg/dL — AB (ref 65–99)
Glucose-Capillary: 94 mg/dL (ref 65–99)
Glucose-Capillary: 175 mg/dL — ABNORMAL HIGH (ref 65–99)

## 2016-12-26 LAB — BASIC METABOLIC PANEL
Anion gap: 7 (ref 5–15)
BUN: 36 mg/dL — AB (ref 6–20)
CHLORIDE: 104 mmol/L (ref 101–111)
CO2: 25 mmol/L (ref 22–32)
CREATININE: 1.48 mg/dL — AB (ref 0.44–1.00)
Calcium: 8.5 mg/dL — ABNORMAL LOW (ref 8.9–10.3)
GFR calc Af Amer: 41 mL/min — ABNORMAL LOW (ref >60)
GFR calc non Af Amer: 35 mL/min — ABNORMAL LOW (ref >60)
GLUCOSE: 105 mg/dL — AB (ref 65–99)
Potassium: 3.9 mmol/L (ref 3.5–5.1)
Sodium: 136 mmol/L (ref 135–145)

## 2016-12-26 LAB — COOXEMETRY PANEL
Carboxyhemoglobin: 1.3 % (ref 0.5–1.5)
Methemoglobin: 1.3 % (ref 0.0–1.5)
O2 Saturation: 60.3 %
TOTAL HEMOGLOBIN: 9.7 g/dL — AB (ref 12.0–16.0)

## 2016-12-26 MED ORDER — FUROSEMIDE 40 MG PO TABS
40.0000 mg | ORAL_TABLET | Freq: Every day | ORAL | Status: DC
  Administered 2016-12-26 – 2016-12-27 (×2): 40 mg via ORAL
  Filled 2016-12-26 (×2): qty 1

## 2016-12-26 NOTE — Progress Notes (Addendum)
[address]       ,[place] [address]             [phone]      22 Days Post-Op Procedure(s) (LRB): CORONARY ARTERY BYPASS GRAFTING (CABG)x 4 WITH ENDOSCOPIC HARVESTING OF RIGHT SAPHENOUS VEIN (N/A) CLIPPING OF ATRIAL APPENDAGE USING ATRICURE 45 ATRICLIP (Left) INTRAOPERATIVE TRANSESOPHAGEAL ECHOCARDIOGRAM (N/A) MAZE PROCEDURE (N/A) Subjective: Feels well overall  Objective: Vital signs in last 24 hours: Temp:  [97.5 F (36.4 C)-97.8 F (36.6 C)] 97.8 F (36.6 C) (05/25 0641) Pulse Rate:  [66-74] 70 (05/25 0641) Cardiac Rhythm: Heart block;Normal sinus rhythm (05/25 0700) Resp:  [18] 18 (05/25 0641) BP: (93-137)/(45-124) 93/45 (05/25 0641) SpO2:  [97 %-100 %] 97 % (05/25 0641) Weight:  [179 lb 1.6 oz (81.2 kg)] 179 lb 1.6 oz (81.2 kg) (05/25 0641)  Hemodynamic parameters for last 24 hours: CVP:  [10 mmHg] 10 mmHg  Intake/Output from previous day: 05/24 0701 - 05/25 0700 In: 770 [P.O.:720; IV Piggyback:50] Out: 2000 [Urine:2000] Intake/Output this shift: No intake/output data recorded.  General appearance: alert, cooperative and no distress Heart: regular rate and rhythm Lungs: mildly dim in left base Abdomen: benign Extremities: no edema Wound: incis healing well  Lab Results:  Recent Labs  12/25/16 0554 12/26/16 0429  WBC 7.4 7.8  HGB 9.5* 9.7*  HCT 31.2* 31.2*  PLT 359 357   BMET:  Recent Labs  12/25/16 0554 12/26/16 0429  NA 133* 136  K 4.7 3.9  CL 101 104  CO2 25 25  GLUCOSE 71 105*  BUN 40* 36*  CREATININE 1.73* 1.48*  CALCIUM 8.3* 8.5*    PT/INR: No results for input(s): LABPROT, INR in the last 72 hours. ABG    Component Value Date/Time   PHART 7.369 12/04/2016 2343   HCO3 27.6 12/17/2016 1752   TCO2 29 12/17/2016 1752   ACIDBASEDEF 1.0 12/04/2016 2343   O2SAT 60.3 12/26/2016 0435   CBG (last 3)   Recent Labs  12/25/16 1617 12/25/16 2056 12/26/16 0636  GLUCAP 70 120* 94    Meds Scheduled  Meds: . amiodarone  200 mg Oral BID  . atorvastatin  40 mg Oral q1800  . Chlorhexidine Gluconate Cloth  6 each Topical Daily  . clotrimazole  1 Applicatorful Vaginal QHS  . feeding supplement (ENSURE ENLIVE)  237 mL Oral BID BM  . folic acid  1 mg Oral Daily  . glipiZIDE  5 mg Oral Q breakfast  . insulin aspart  0-15 Units Subcutaneous TID WC  . polyethylene glycol  17 g Oral Daily   Continuous Infusions: PRN Meds:.acetaminophen, ALPRAZolam, ALPRAZolam, alum & mag hydroxide-simeth, bisacodyl **OR** bisacodyl, magnesium hydroxide, miconazole, ondansetron (ZOFRAN) IV, oxyCODONE, sodium chloride flush, traMADol  Xrays No results found.  Assessment/Plan: S/P Procedure(s) (LRB): CORONARY ARTERY BYPASS GRAFTING (CABG)x 4 WITH ENDOSCOPIC HARVESTING OF RIGHT SAPHENOUS VEIN (N/A) CLIPPING OF ATRIAL APPENDAGE USING ATRICURE 45 ATRICLIP (Left) INTRAOPERATIVE TRANSESOPHAGEAL ECHOCARDIOGRAM (N/A) MAZE PROCEDURE (N/A)  1 doing well overall, coox- 60.3 2 BUN/CR improved , appears to not require diuretics currently 3 sinus rhythm on amio 4 BP , somewhat variable- no ace-I or arb currently with elev creat 5 H/H stable 6 sugars adeq controlled 7 no fevers or UTI sx off rocephin 8 cont  pulm toilet and rehab  LOS: 29 days    GOLD,WAYNE E 12/26/2016   Chart reviewed, patient examined, agree with above. She says that she feels so much better. No shortness of breath.  Lungs sound good. Slight decrease at left base compared to right.  Diuretics held due to low BP and rising creatinine. Creatinine is coming back down now. She still has moderate bilateral leg edema and wearing TED stockings. Will likely need a diuretic at some point to allow gentle diuresis. Continue ambulation, IS, encouraging nutrition. No BM since Sunday. Taking a laxative.

## 2016-12-26 NOTE — Progress Notes (Signed)
CARDIAC REHAB PHASE I   PRE:  Rate/Rhythm: 73 SR  BP:  Supine:   Sitting: 130/76  Standing:    SaO2: 96%RA  MODE:  Ambulation: 300 ft   POST:  Rate/Rhythm: 78 SR  BP:  Supine:   Sitting: 133/73  Standing:    SaO2: 96%RA 4255-2589 Pt feeling much better. Walked 300 ft on RA with rolling walker and minimal asst,  Stopped a couple of times to rest. Knee bothering her some with walking. Tired by end of walk but tolerated well. To sitting on side of bed . Bed alarm on.   Graylon Good, RN BSN  12/26/2016 9:14 AM

## 2016-12-26 NOTE — Care Management Important Message (Signed)
Important Message  Patient Details  Name: [name] MRN: [id number] Date of Birth: August 25, 1945   Medicare Important Message Given:  Yes    [name] 12/26/2016, 12:21 PM

## 2016-12-26 NOTE — Progress Notes (Signed)
Advanced Heart Failure Rounding Note   Subjective:    Transferred to ICU due to cardiogenic shock --> placed on norepi and milrinone. Initial CO-OX 34%.  EF 40-45% by echo   Off heparin due to hematuria. CT abd 5/18 reviewed personally. Shows no stones or masses.   S/p IR thoracentesis 12/22/16 with 1.2 L out.   Feeling much better. No complaints this am. Denies lightheadedness or dizziness.   Remains in NSR on amio po 200 mg BID.   Negative 1.2 L. Weight shows up 3 lbs, but down overall.   Coox 60.3% this am. Hgb 9.7 this am.   Objective:     Vital Signs:   Temp:  [97.5 F (36.4 C)-97.8 F (36.6 C)] 97.8 F (36.6 C) (05/25 0641) Pulse Rate:  [66-74] 70 (05/25 0641) Resp:  [18] 18 (05/25 0641) BP: (93-137)/(45-124) 93/45 (05/25 0641) SpO2:  [97 %-100 %] 97 % (05/25 0641) Weight:  [179 lb 1.6 oz (81.2 kg)] 179 lb 1.6 oz (81.2 kg) (05/25 0641) Last BM Date: 12/20/16  Weight change: Filed Weights   12/23/16 0500 12/24/16 0500 12/26/16 0641  Weight: 176 lb 12.9 oz (80.2 kg) 176 lb 5.9 oz (80 kg) 179 lb 1.6 oz (81.2 kg)    Intake/Output:   Intake/Output Summary (Last 24 hours) at 12/26/16 0929 Last data filed at 12/26/16 5465  Gross per 24 hour  Intake              770 ml  Output             2000 ml  Net            -1230 ml    Physical Exam: General: Well appearing. No resp difficulty. HEENT: normal Neck: supple. JVP 6-7. Carotids 2+ bilat; no bruits. No thyromegaly or nodule noted. Cor: PMI nondisplaced. RRR, No M/G/R noted Lungs: CTAB, normal effort. Abdomen: soft, non-tender, distended, no HSM. No bruits or masses. +BS  Extremities: no cyanosis, clubbing, or rash. Ted hose in place with 1-2+ edema. Neuro: alert & orientedx3, cranial nerves grossly intact. moves all 4 extremities w/o difficulty. Affect pleasant   Telemetry: Personally reviewed, NSR 70-80s     Labs: Basic Metabolic Panel:  Recent Labs Lab 12/22/16 0423 12/23/16 0253 12/24/16 0317  12/25/16 0554 12/26/16 0429  NA 132* 132* 130* 133* 136  K 4.3 5.1 4.4 4.7 3.9  CL 94* 93* 95* 101 104  CO2 30 28 27 25 25   GLUCOSE 83 117* 120* 71 105*  BUN 37* 38* 33* 40* 36*  CREATININE 1.44* 1.65* 1.55* 1.73* 1.48*  CALCIUM 8.2* 8.6* 8.7* 8.3* 8.5*    Liver Function Tests: No results for input(s): AST, ALT, ALKPHOS, BILITOT, PROT, ALBUMIN in the last 168 hours. No results for input(s): LIPASE, AMYLASE in the last 168 hours. No results for input(s): AMMONIA in the last 168 hours.  CBC:  Recent Labs Lab 12/22/16 0423 12/23/16 0253 12/24/16 0317 12/25/16 0554 12/26/16 0429  WBC 7.4 8.4 7.5 7.4 7.8  HGB 9.1* 9.6* 9.6* 9.5* 9.7*  HCT 28.9* 31.0* 30.3* 31.2* 31.2*  MCV 87.6 87.8 87.1 88.1 88.4  PLT 349 405* 344 359 357   Cardiac Enzymes: No results for input(s): CKTOTAL, CKMB, CKMBINDEX, TROPONINI in the last 168 hours.  BNP: BNP (last 3 results)  Recent Labs  11/27/16 0159 12/17/16 1255  BNP 920.0* 1,062.6*   ProBNP (last 3 results) No results for input(s): PROBNP in the last 8760 hours.  Other results:  Imaging:  No results found.   Medications:     Scheduled Medications: . amiodarone  200 mg Oral BID  . atorvastatin  40 mg Oral q1800  . Chlorhexidine Gluconate Cloth  6 each Topical Daily  . clotrimazole  1 Applicatorful Vaginal QHS  . feeding supplement (ENSURE ENLIVE)  237 mL Oral BID BM  . folic acid  1 mg Oral Daily  . glipiZIDE  5 mg Oral Q breakfast  . insulin aspart  0-15 Units Subcutaneous TID WC  . polyethylene glycol  17 g Oral Daily    Infusions:   PRN Medications: acetaminophen, ALPRAZolam, ALPRAZolam, alum & mag hydroxide-simeth, bisacodyl **OR** bisacodyl, magnesium hydroxide, miconazole, ondansetron (ZOFRAN) IV, oxyCODONE, sodium chloride flush, traMADol   Assessment/Plan/Discussion    1. Acute on chronic systolic HF -> Cardiogenic Shock - Co-ox 60% off milrinone. Keep off.    - Volume status mildly elevated. Resume po  lasix 40 mg daily.  - Pressures somewhat soft.  Creatinine improved.  - No BB with soft mixed venous sat.  2.  Left pleural effusion - s/p Thoracentesis 12/22/16 with 1.2 L out.  - CXR afterwards with mild improvement. Images reviewed personally.  Stable. No change.  3. 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  - Surgery following. Stable. No change.  3. PAF - Remains in NSR on po amio 200 mg BID for now.  - Off heparin due to hematuria. No change. Tolerating amio.  4. Hematuria - CT ab negative. Has Foley for irrigation. - Received 1 UPRBC 12/21/16 with improvement of Hgb. (Total of 3 UPRBCs thus far) - Resolved. 5. AKI: - Creatinine 1.1 -> 1.4 -> 1.6. -> 1.55 -> 1.73 -> 1.48. Holding diuretics.   6. UTI: -  On bactrim 5/14 through 5/16. Switched to rocephin 5/16 . Urine CX + EColi  - Blood cultures NGTD - Continue pyridium. No change. Has had some mild urinary retention.  7. Deconditioning - Continue to mobilize.    8. Symptomatic anemia - Improved s/p transfusion 12/21/16   - Stable Hgb. No bleeding.   Length of Stay: [place], [place]  Advanced Heart Failure Team Pager [phone] (M-F; 7a - 4p)  Please contact [facility] for night-coverage after hours (4p -7a ) and weekends on [email or web address]    Patient seen and examined with the above-signed Advanced Practice Provider and/or Housestaff. I personally reviewed laboratory data, imaging studies and relevant notes. I independently examined the patient and formulated the important aspects of the plan. I have edited the note to reflect any of my changes or salient points. I have personally discussed the plan with the patient and/or family.  Continues to do well. Co-ox remains stable off milrinone. Volume status creeping up. Will resume po lasix. BP did not tolerate losartan earlier in the week. May try again tomorrow. Remains in NSR on po amio. Will continue. Not on  AC due to persistent hematuria. Will defer for now. Creatinine improved.   Will follow co-ox over weekend. If stable consider possible D/C on Sunday. Discussed with Dr. Prescott Gum. Continue to mobilize.

## 2016-12-26 NOTE — Progress Notes (Signed)
PT Cancellation Note  Patient Details Name: [name] MRN: [id number] DOB: Dec 18, 1945   Cancelled Treatment:    Reason Eval/Treat Not Completed: Patient declined, no reason specified (pt states she was given a laxative and refuses to mobilize currently for fear of BM )   [name] B [name] 12/26/2016, 1:07 PM  [name], [place]

## 2016-12-26 NOTE — Progress Notes (Signed)
Occupational Therapy Treatment Patient Details Name: [name] MRN: [id number] DOB: [birth year] Today's Date: 12/26/2016    History of present illness 71 yo s/p NSTEMI with CABG 5/3. PMhx: CAD, CHF, VT, orthostatic hypotension, ICD.  Transferred to ICU on due to cardiogenic shock on 5/16 with lower HGBs.  Pt also noticed to have clots in her urine, so heparin stopped and CT of abdomen ordered which did not show any acute processes in the abdomen, but did show bil pleural effusions.     OT comments  Pt overall performing at a min guard to supervision level in standing grooming and toileting. Educated at length in energy conservation this visit. Pt is hopeful to go home this weekend. Will continue to follow.  Follow Up Recommendations  Home health OT;Supervision/Assistance - 24 hour    Equipment Recommendations  3 in 1 bedside commode    Recommendations for Other Services      Precautions / Restrictions Precautions Precautions: Fall;Sternal Precaution Comments: Pt able to state all sternal precautions       Mobility Bed Mobility               General bed mobility comments: pt at EOB  Transfers Overall transfer level: Needs assistance Equipment used: Rolling walker (2 wheeled) Transfers: Sit to/from Stand Sit to Stand: Min guard         General transfer comment: from bed and 3 in 1, reminders for technique and to use momentum    Balance Overall balance assessment: Needs assistance   Sitting balance-Leahy Scale: Good       Standing balance-Leahy Scale: Fair                             ADL either performed or assessed with clinical judgement   ADL Overall ADL's : Needs assistance/impaired     Grooming: Wash/dry hands;Sitting;Supervision/safety                   Toilet Transfer: Supervision/safety;Ambulation;RW;BSC   Toileting- Water quality scientist and Hygiene: Supervision/safety;Sit to/from Nurse, children's  Details (indicate cue type and reason): instucted in use of 3 in 1 or plastic lawn chair as shower seat to conserve energy Functional mobility during ADLs: Supervision/safety;Rolling walker General ADL Comments: Educate in energy conservation and reinforced with handout     Vision       Perception     Praxis      Cognition Arousal/Alertness: Awake/alert Behavior During Therapy: WFL for tasks assessed/performed Overall Cognitive Status: Within Functional Limits for tasks assessed                                          Exercises     Shoulder Instructions       General Comments      Pertinent Vitals/ Pain       Pain Assessment: No/denies pain  Home Living                                          Prior Functioning/Environment              Frequency  Min 2X/week        Progress Toward Goals  OT Goals(current goals can now  be found in the care plan section)  Progress towards OT goals: Progressing toward goals  Acute Rehab OT Goals Patient Stated Goal: to go home  OT Goal Formulation: With patient Time For Goal Achievement: 01/06/17  Plan Discharge plan remains appropriate    Co-evaluation                 AM-PAC PT "6 Clicks" Daily Activity     Outcome Measure   Help from another person eating meals?: None Help from another person taking care of personal grooming?: A Little Help from another person toileting, which includes using toliet, bedpan, or urinal?: A Little Help from another person bathing (including washing, rinsing, drying)?: A Little Help from another person to put on and taking off regular upper body clothing?: None Help from another person to put on and taking off regular lower body clothing?: A Little 6 Click Score: 20    End of Session Equipment Utilized During Treatment: Gait belt;Rolling walker  OT Visit Diagnosis: Unsteadiness on feet (R26.81);History of falling (Z91.81)   Activity  Tolerance Patient tolerated treatment well   Patient Left in bed;with call bell/phone within reach   Nurse Communication          Time: 1610-9604 OT Time Calculation (min): 24 min  Charges: OT General Charges $OT Visit: 1 Procedure OT Treatments $Self Care/Home Management : 23-37 mins    Malka So 12/26/2016, 2:10 PM  [phone]

## 2016-12-26 NOTE — Care Management Note (Signed)
Case Management Note Previous CM note initiated by Bethena Roys, RN 12/02/2016, 2:50 PM   Patient Details  Name: [name] MRN: [id number] Date of Birth: [birth year]  Subjective/Objective: Pt presented for chest pain - post cardiac cath 11-28-16 revealled 3 vessel CAD- Plan for CABG on Thursday. Pt is from home with daughter and grandson.             Action/Plan: CM will continue to monitor for disposition needs post d/c.   Expected Discharge Date:                  Expected Discharge Plan:  [place]  In-House Referral:  Clinical Social Work  Discharge planning Services  CM Consult  Post Acute Care Choice:    Choice offered to:     DME Arranged:    DME Agency:     HH Arranged:    [place] Agency:     Status of Service:  In process, will continue to follow  If discussed at Long Length of Stay Meetings, dates discussed:  5/15, 5/17  Additional Comments:  12/26/16- 1600- Valentina Gu, CM- pt has returned to 2W- progressing well- may be able to go home with [facility] vs SNF- CM to continue to follow  12/18/16- 1020- Ula Couvillon RN, CM- pt with decline yesterday tx back to ICU-2H for further care-with IV norepi and milrinone started. Also diuresed with IV lasix and IV amio for Afib. CM and CSW to continue to follow for d/c needs.   12/12/16- 1000- Elester Apodaca RN, CM- pt tx from [facility] to 2W on 5/9- CSW following for SNF placement- pt with post op afib-   12/09/16- 1125-  Valentina Gu, CM-  Pt s/p CABGx4 on 5/3-  Remains on 2H post op  Dawayne Patricia, RN 12/26/2016, 4:23 PM [phone]

## 2016-12-27 LAB — BASIC METABOLIC PANEL
Anion gap: 8 (ref 5–15)
BUN: 34 mg/dL — AB (ref 6–20)
CALCIUM: 8.7 mg/dL — AB (ref 8.9–10.3)
CO2: 25 mmol/L (ref 22–32)
CREATININE: 1.21 mg/dL — AB (ref 0.44–1.00)
Chloride: 105 mmol/L (ref 101–111)
GFR calc Af Amer: 52 mL/min — ABNORMAL LOW (ref >60)
GFR, EST NON AFRICAN AMERICAN: 45 mL/min — AB (ref >60)
Glucose, Bld: 89 mg/dL (ref 65–99)
Potassium: 4 mmol/L (ref 3.5–5.1)
SODIUM: 138 mmol/L (ref 135–145)

## 2016-12-27 LAB — CBC
HCT: 31.2 % — ABNORMAL LOW (ref 36.0–46.0)
HEMOGLOBIN: 9.5 g/dL — AB (ref 12.0–15.0)
MCH: 27.3 pg (ref 26.0–34.0)
MCHC: 30.4 g/dL (ref 30.0–36.0)
MCV: 89.7 fL (ref 78.0–100.0)
Platelets: 329 10*3/uL (ref 150–400)
RBC: 3.48 MIL/uL — ABNORMAL LOW (ref 3.87–5.11)
RDW: 16.6 % — ABNORMAL HIGH (ref 11.5–15.5)
WBC: 7.8 10*3/uL (ref 4.0–10.5)

## 2016-12-27 LAB — GLUCOSE, CAPILLARY
GLUCOSE-CAPILLARY: 129 mg/dL — AB (ref 65–99)
Glucose-Capillary: 93 mg/dL (ref 65–99)
Glucose-Capillary: 100 mg/dL — ABNORMAL HIGH (ref 65–99)
Glucose-Capillary: 150 mg/dL — ABNORMAL HIGH (ref 65–99)

## 2016-12-27 LAB — COOXEMETRY PANEL
Carboxyhemoglobin: 1.7 % — ABNORMAL HIGH (ref 0.5–1.5)
METHEMOGLOBIN: 0.9 % (ref 0.0–1.5)
O2 SAT: 56.3 %
TOTAL HEMOGLOBIN: 10 g/dL — AB (ref 12.0–16.0)

## 2016-12-27 MED ORDER — FUROSEMIDE 40 MG PO TABS
40.0000 mg | ORAL_TABLET | Freq: Two times a day (BID) | ORAL | Status: DC
  Administered 2016-12-27 – 2016-12-29 (×4): 40 mg via ORAL
  Filled 2016-12-27 (×4): qty 1

## 2016-12-27 MED ORDER — FLEET ENEMA 7-19 GM/118ML RE ENEM
1.0000 | ENEMA | Freq: Every day | RECTAL | Status: DC | PRN
  Administered 2016-12-27: 1 via RECTAL
  Filled 2016-12-27: qty 1

## 2016-12-27 NOTE — Progress Notes (Signed)
Pt awaiting BM today, declined walking x2. Now eating lunch as well. Discussed ed with pt including sternal precautions, IS, mobility, diet, HF management (gave book), and CRPII. She voiced understanding and will refer her to [place]. Encouraged walking later. [place], ACSM 1:39 PM 12/27/2016

## 2016-12-27 NOTE — Progress Notes (Addendum)
[address]       [place] [address]             [phone]      23 Days Post-Op Procedure(s) (LRB): CORONARY ARTERY BYPASS GRAFTING (CABG)x 4 WITH ENDOSCOPIC HARVESTING OF RIGHT SAPHENOUS VEIN (N/A) CLIPPING OF ATRIAL APPENDAGE USING ATRICURE 45 ATRICLIP (Left) INTRAOPERATIVE TRANSESOPHAGEAL ECHOCARDIOGRAM (N/A) MAZE PROCEDURE (N/A) Subjective: "My legs are more swollen today", also more constipated  Objective: Vital signs in last 24 hours: Temp:  [97.4 F (36.3 C)-97.8 F (36.6 C)] 97.8 F (36.6 C) (05/26 0437) Pulse Rate:  [64-70] 64 (05/26 0437) Cardiac Rhythm: Heart block;Normal sinus rhythm (05/26 0700) Resp:  [18] 18 (05/26 0437) BP: (100-130)/(55-66) 100/55 (05/26 0437) SpO2:  [96 %-98 %] 97 % (05/26 0437) Weight:  [178 lb 11.2 oz (81.1 kg)] 178 lb 11.2 oz (81.1 kg) (05/26 0437)  Hemodynamic parameters for last 24 hours:    Intake/Output from previous day: No intake/output data recorded. Intake/Output this shift: No intake/output data recorded.  General appearance: alert, cooperative and no distress Heart: regular rate and rhythm Lungs: min dim in left base Abdomen: benign Extremities: extremities normal, atraumatic, no cyanosis or edema and + increase edema in feet Wound: incis healing well  Lab Results:  Recent Labs  12/26/16 0429 12/27/16 0400  WBC 7.8 7.8  HGB 9.7* 9.5*  HCT 31.2* 31.2*  PLT 357 329   BMET:  Recent Labs  12/26/16 0429 12/27/16 0400  NA 136 138  K 3.9 4.0  CL 104 105  CO2 25 25  GLUCOSE 105* 89  BUN 36* 34*  CREATININE 1.48* 1.21*  CALCIUM 8.5* 8.7*    PT/INR: No results for input(s): LABPROT, INR in the last 72 hours. ABG    Component Value Date/Time   PHART 7.369 12/04/2016 2343   HCO3 27.6 12/17/2016 1752   TCO2 29 12/17/2016 1752   ACIDBASEDEF 1.0 12/04/2016 2343   O2SAT 56.3 12/27/2016 0412   CBG (last 3)   Recent Labs  12/26/16 1640 12/26/16 2100 12/27/16 0610  GLUCAP 126*  179* 93    Meds Scheduled Meds: . amiodarone  200 mg Oral BID  . atorvastatin  40 mg Oral q1800  . Chlorhexidine Gluconate Cloth  6 each Topical Daily  . clotrimazole  1 Applicatorful Vaginal QHS  . feeding supplement (ENSURE ENLIVE)  237 mL Oral BID BM  . folic acid  1 mg Oral Daily  . furosemide  40 mg Oral Daily  . glipiZIDE  5 mg Oral Q breakfast  . insulin aspart  0-15 Units Subcutaneous TID WC  . polyethylene glycol  17 g Oral Daily   Continuous Infusions: PRN Meds:.acetaminophen, ALPRAZolam, ALPRAZolam, alum & mag hydroxide-simeth, bisacodyl **OR** bisacodyl, magnesium hydroxide, miconazole, ondansetron (ZOFRAN) IV, oxyCODONE, sodium chloride flush, traMADol  Xrays No results found.  Assessment/Plan: S/P Procedure(s) (LRB): CORONARY ARTERY BYPASS GRAFTING (CABG)x 4 WITH ENDOSCOPIC HARVESTING OF RIGHT SAPHENOUS VEIN (N/A) CLIPPING OF ATRIAL APPENDAGE USING ATRICURE 45 ATRICLIP (Left) INTRAOPERATIVE TRANSESOPHAGEAL ECHOCARDIOGRAM (N/A) MAZE PROCEDURE (N/A)  1 more edematous today, BUN/CR improved, oral lasix restarted yesterday. Encouraged to keep legs elevated as able. Nutrition status is contributing to edema as well. Monitor closely for dehydration signs. CO-OX 56 2 sinus with PVC's, BP control is good 3 AHF team conts to assist with management 4 will order fleets enema for constipation 5 sugars controlled 6 no fevers or leukocytosis  LOS: 30 days    [name],[name] 12/27/2016 I have  seen and examined [name] and agree with the above assessment  and plan.  Grace Isaac MD [facility] [phone] Office [phone] 12/27/2016 11:08 AM

## 2016-12-27 NOTE — Progress Notes (Addendum)
Advanced Heart Failure Rounding Note   Subjective:    Transferred to ICU due to cardiogenic shock --> placed on norepi and milrinone. Initial CO-OX 34%.  EF 40-45% by echo   Off heparin due to hematuria. CT abd 5/18 reviewed personally. Shows no stones or masses.   S/p IR thoracentesis 12/22/16 with 1.2 L out.   Feeling much better. No complaints this am. Denies lightheadedness or dizziness.   Remains in NSR on amio po 200 mg BID.   No I&O in th elast 24 hours. Weight shows up 2 lbs, worsening LE edema.   Coox 56.3% this am. Hgb 9.5 this am. Crea 1.2.  Objective:     Vital Signs:   Temp:  [97.4 F (36.3 C)-97.8 F (36.6 C)] 97.8 F (36.6 C) (05/26 0437) Pulse Rate:  [64-70] 64 (05/26 0437) Resp:  [18] 18 (05/26 0437) BP: (100-130)/(55-66) 100/55 (05/26 0437) SpO2:  [96 %-98 %] 97 % (05/26 0437) Weight:  [178 lb 11.2 oz (81.1 kg)] 178 lb 11.2 oz (81.1 kg) (05/26 0437) Last BM Date: 12/20/16  Weight change: Filed Weights   12/24/16 0500 12/26/16 0641 12/27/16 0437  Weight: 176 lb 5.9 oz (80 kg) 179 lb 1.6 oz (81.2 kg) 178 lb 11.2 oz (81.1 kg)    Intake/Output:  No intake or output data in the 24 hours ending 12/27/16 1045  Physical Exam: General: Well appearing. No resp difficulty. HEENT: normal Neck: supple. JVP 6-7. Carotids 2+ bilat; no bruits. No thyromegaly or nodule noted. Cor: PMI nondisplaced. RRR, No M/G/R noted Lungs: CTAB, normal effort. Abdomen: soft, non-tender, distended, no HSM. No bruits or masses. +BS  Extremities: no cyanosis, clubbing, or rash. Ted hose in place with 2+ edema. Neuro: alert & orientedx3, cranial nerves grossly intact. moves all 4 extremities w/o difficulty. Affect pleasant   Telemetry: Personally reviewed, NSR 70-80s     Labs: Basic Metabolic Panel:  Recent Labs Lab 12/23/16 0253 12/24/16 0317 12/25/16 0554 12/26/16 0429 12/27/16 0400  NA 132* 130* 133* 136 138  K 5.1 4.4 4.7 3.9 4.0  CL 93* 95* 101 104 105  CO2  28 27 25 25 25   GLUCOSE 117* 120* 71 105* 89  BUN 38* 33* 40* 36* 34*  CREATININE 1.65* 1.55* 1.73* 1.48* 1.21*  CALCIUM 8.6* 8.7* 8.3* 8.5* 8.7*    Liver Function Tests: No results for input(s): AST, ALT, ALKPHOS, BILITOT, PROT, ALBUMIN in the last 168 hours. No results for input(s): LIPASE, AMYLASE in the last 168 hours. No results for input(s): AMMONIA in the last 168 hours.  CBC:  Recent Labs Lab 12/23/16 0253 12/24/16 0317 12/25/16 0554 12/26/16 0429 12/27/16 0400  WBC 8.4 7.5 7.4 7.8 7.8  HGB 9.6* 9.6* 9.5* 9.7* 9.5*  HCT 31.0* 30.3* 31.2* 31.2* 31.2*  MCV 87.8 87.1 88.1 88.4 89.7  PLT 405* 344 359 357 329   Cardiac Enzymes: No results for input(s): CKTOTAL, CKMB, CKMBINDEX, TROPONINI in the last 168 hours.  BNP: BNP (last 3 results)  Recent Labs  11/27/16 0159 12/17/16 1255  BNP 920.0* 1,062.6*   ProBNP (last 3 results) No results for input(s): PROBNP in the last 8760 hours.  Other results:  Imaging: No results found.   Medications:     Scheduled Medications: . amiodarone  200 mg Oral BID  . atorvastatin  40 mg Oral q1800  . Chlorhexidine Gluconate Cloth  6 each Topical Daily  . clotrimazole  1 Applicatorful Vaginal QHS  . feeding supplement (ENSURE ENLIVE)  237  mL Oral BID BM  . folic acid  1 mg Oral Daily  . furosemide  40 mg Oral Daily  . glipiZIDE  5 mg Oral Q breakfast  . insulin aspart  0-15 Units Subcutaneous TID WC  . polyethylene glycol  17 g Oral Daily    Infusions:   PRN Medications: acetaminophen, ALPRAZolam, ALPRAZolam, alum & mag hydroxide-simeth, bisacodyl **OR** bisacodyl, magnesium hydroxide, miconazole, ondansetron (ZOFRAN) IV, oxyCODONE, sodium chloride flush, sodium phosphate, traMADol   Assessment/Plan/Discussion    1. Acute on chronic systolic HF -> Cardiogenic Shock - Co-ox 60% off milrinone. Keep off.    - Volume status mildly elevated. Increase po lasix to 40 mg BID.  - Pressures somewhat soft.  Creatinine  improved.  - No BB with soft mixed venous sat.  2.  Left pleural effusion - s/p Thoracentesis 12/22/16 with 1.2 L out.  - CXR afterwards with mild improvement. Images reviewed personally.  Stable. No change.  3. 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  - Surgery following. Stable. No change.  3. PAF - Remains in NSR on po amio 200 mg BID for now.  - Off heparin due to hematuria. No change. Tolerating amio.  4. Hematuria - CT ab negative. Has Foley for irrigation. - Received 1 UPRBC 12/21/16 with improvement of Hgb. (Total of 3 UPRBCs thus far) - Resolved. 5. AKI: - Creatinine 1.1 -> 1.4 -> 1.6. -> 1.55 -> 1.73 -> 1.48-> 1.2. Resumed lasix.   6. UTI: -  On bactrim 5/14 through 5/16. Switched to rocephin 5/16 . Urine CX + EColi  - Blood cultures NGTD - Continue pyridium. No change. Has had some mild urinary retention.  7. Deconditioning - Continue to mobilize.    8. Symptomatic anemia - Improved s/p transfusion 12/21/16   - Stable Hgb. No bleeding.   Continues to do well. Co-ox remains stable off milrinone. Volume status creeping up. Will resume po lasix. BP did not tolerate losartan earlier in the week. May try again tomorrow. Remains in NSR on po amio. Will continue. Not on AC due to persistent hematuria. Will defer for now. Creatinine improved.   Will follow co-ox over weekend. If stable consider possible D/C on Sunday. Discussed with Dr. Prescott Gum. Continue to mobilize.

## 2016-12-28 LAB — CBC
HCT: 31 % — ABNORMAL LOW (ref 36.0–46.0)
HEMOGLOBIN: 9.3 g/dL — AB (ref 12.0–15.0)
MCH: 27 pg (ref 26.0–34.0)
MCHC: 30 g/dL (ref 30.0–36.0)
MCV: 90.1 fL (ref 78.0–100.0)
PLATELETS: 300 10*3/uL (ref 150–400)
RBC: 3.44 MIL/uL — AB (ref 3.87–5.11)
RDW: 16.5 % — ABNORMAL HIGH (ref 11.5–15.5)
WBC: 7.9 10*3/uL (ref 4.0–10.5)

## 2016-12-28 LAB — BASIC METABOLIC PANEL
ANION GAP: 5 (ref 5–15)
BUN: 38 mg/dL — AB (ref 6–20)
CO2: 28 mmol/L (ref 22–32)
Calcium: 8.7 mg/dL — ABNORMAL LOW (ref 8.9–10.3)
Chloride: 105 mmol/L (ref 101–111)
Creatinine, Ser: 1.22 mg/dL — ABNORMAL HIGH (ref 0.44–1.00)
GFR calc Af Amer: 51 mL/min — ABNORMAL LOW (ref >60)
GFR, EST NON AFRICAN AMERICAN: 44 mL/min — AB (ref >60)
Glucose, Bld: 88 mg/dL (ref 65–99)
POTASSIUM: 4.1 mmol/L (ref 3.5–5.1)
SODIUM: 138 mmol/L (ref 135–145)

## 2016-12-28 LAB — COOXEMETRY PANEL
Carboxyhemoglobin: 1.3 % (ref 0.5–1.5)
METHEMOGLOBIN: 1.3 % (ref 0.0–1.5)
O2 Saturation: 53.8 %
TOTAL HEMOGLOBIN: 9.5 g/dL — AB (ref 12.0–16.0)

## 2016-12-28 LAB — GLUCOSE, CAPILLARY
GLUCOSE-CAPILLARY: 90 mg/dL (ref 65–99)
Glucose-Capillary: 106 mg/dL — ABNORMAL HIGH (ref 65–99)
Glucose-Capillary: 83 mg/dL (ref 65–99)
Glucose-Capillary: 116 mg/dL — ABNORMAL HIGH (ref 65–99)

## 2016-12-28 NOTE — Progress Notes (Addendum)
Advanced Heart Failure Rounding Note   Subjective:    Transferred to ICU due to cardiogenic shock --> placed on norepi and milrinone. Initial CO-OX 34%.  EF 40-45% by echo   Off heparin due to hematuria. CT abd 5/18 reviewed personally. Shows no stones or masses.   S/p IR thoracentesis 12/22/16 with 1.2 L out.   Feeling much better. No complaints this am. Denies lightheadedness or dizziness. She would like to go home tomorrow.   Remains in NSR on amio po 200 mg BID.   No I&O in th elast 24 hours. Weight shows up 2 lbs, worsening LE edema.   Coox 53.8% this am. Hgb 9.3 this am. Crea 1.22.  Objective:     Vital Signs:   Temp:  [97.6 F (36.4 C)-98.4 F (36.9 C)] 98.4 F (36.9 C) (05/27 0523) Pulse Rate:  [68-70] 70 (05/27 0523) Resp:  [18] 18 (05/27 0523) BP: (104-121)/(48-56) 104/48 (05/27 0523) SpO2:  [94 %-98 %] 94 % (05/27 0523) Weight:  [180 lb 6.4 oz (81.8 kg)] 180 lb 6.4 oz (81.8 kg) (05/27 0523) Last BM Date: 12/20/16  Weight change: Filed Weights   12/26/16 0641 12/27/16 0437 12/28/16 0523  Weight: 179 lb 1.6 oz (81.2 kg) 178 lb 11.2 oz (81.1 kg) 180 lb 6.4 oz (81.8 kg)    Intake/Output:   Intake/Output Summary (Last 24 hours) at 12/28/16 1149 Last data filed at 12/28/16 0532  Gross per 24 hour  Intake              240 ml  Output             1550 ml  Net            -1310 ml    Physical Exam: General: Well appearing. No resp difficulty. HEENT: normal Neck: supple. JVP 6-7. Carotids 2+ bilat; no bruits. No thyromegaly or nodule noted. Cor: PMI nondisplaced. RRR, No M/G/R noted Lungs: crackles at both bases, normal effort. Abdomen: soft, non-tender, distended, no HSM. No bruits or masses. +BS  Extremities: no cyanosis, clubbing, or rash. Ted hose in place with 2+ edema. Neuro: alert & orientedx3, cranial nerves grossly intact. moves all 4 extremities w/o difficulty. Affect pleasant   Telemetry: Personally reviewed, NSR 70-80s     Labs: Basic  Metabolic Panel:  Recent Labs Lab 12/24/16 0317 12/25/16 0554 12/26/16 0429 12/27/16 0400 12/28/16 0500  NA 130* 133* 136 138 138  K 4.4 4.7 3.9 4.0 4.1  CL 95* 101 104 105 105  CO2 27 25 25 25 28   GLUCOSE 120* 71 105* 89 88  BUN 33* 40* 36* 34* 38*  CREATININE 1.55* 1.73* 1.48* 1.21* 1.22*  CALCIUM 8.7* 8.3* 8.5* 8.7* 8.7*    Liver Function Tests: No results for input(s): AST, ALT, ALKPHOS, BILITOT, PROT, ALBUMIN in the last 168 hours. No results for input(s): LIPASE, AMYLASE in the last 168 hours. No results for input(s): AMMONIA in the last 168 hours.  CBC:  Recent Labs Lab 12/24/16 0317 12/25/16 0554 12/26/16 0429 12/27/16 0400 12/28/16 0500  WBC 7.5 7.4 7.8 7.8 7.9  HGB 9.6* 9.5* 9.7* 9.5* 9.3*  HCT 30.3* 31.2* 31.2* 31.2* 31.0*  MCV 87.1 88.1 88.4 89.7 90.1  PLT 344 359 357 329 300   Cardiac Enzymes: No results for input(s): CKTOTAL, CKMB, CKMBINDEX, TROPONINI in the last 168 hours.  BNP: BNP (last 3 results)  Recent Labs  11/27/16 0159 12/17/16 1255  BNP 920.0* 1,062.6*   ProBNP (last 3 results)  No results for input(s): PROBNP in the last 8760 hours.  Other results:  Imaging: No results found.   Medications:     Scheduled Medications: . amiodarone  200 mg Oral BID  . atorvastatin  40 mg Oral q1800  . Chlorhexidine Gluconate Cloth  6 each Topical Daily  . clotrimazole  1 Applicatorful Vaginal QHS  . feeding supplement (ENSURE ENLIVE)  237 mL Oral BID BM  . folic acid  1 mg Oral Daily  . furosemide  40 mg Oral BID  . glipiZIDE  5 mg Oral Q breakfast  . insulin aspart  0-15 Units Subcutaneous TID WC  . polyethylene glycol  17 g Oral Daily    Infusions:   PRN Medications: acetaminophen, ALPRAZolam, ALPRAZolam, alum & mag hydroxide-simeth, bisacodyl **OR** bisacodyl, magnesium hydroxide, miconazole, ondansetron (ZOFRAN) IV, oxyCODONE, sodium chloride flush, sodium phosphate, traMADol   Assessment/Plan/Discussion    1. Acute on  chronic systolic HF -> Cardiogenic Shock - Co-ox 60% off milrinone. Keep off.    - Volume status mildly elevated. Increase po lasix to 40 mg BID.  - Pressures somewhat soft.  Creatinine improved.  - No BB with soft mixed venous sat.  2.  Left pleural effusion - s/p Thoracentesis 12/22/16 with 1.2 L out.  - CXR afterwards with mild improvement. Images reviewed personally.  Stable. No change.  3. 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  - Surgery following. Stable. No change.  3. PAF - Remains in NSR on po amio 200 mg BID for now.  - Off heparin due to hematuria. No change. Tolerating amio.  4. Hematuria - CT ab negative. Has Foley for irrigation. - Received 1 UPRBC 12/21/16 with improvement of Hgb. (Total of 3 UPRBCs thus far) - Resolved. 5. AKI: - Creatinine 1.1 -> 1.4 -> 1.6. -> 1.55 -> 1.73 -> 1.48-> 1.2. Resumed lasix.   6. UTI: -  On bactrim 5/14 through 5/16. Switched to rocephin 5/16 . Urine CX + EColi  - Blood cultures NGTD - Continue pyridium. No change. Has had some mild urinary retention.  7. Deconditioning - Continue to mobilize.    8. Symptomatic anemia - Improved s/p transfusion 12/21/16   - Stable Hgb. No bleeding.   Continues to do well. Co-ox slightly decreasing off milrinone, however negative fluid balance and improving crea, today stable at 1.22. Continue lasix 40 mg po BID. BP did not tolerate losartan earlier in the week, I will try lisinopril 2.5 mg po daily today, if tolerated continue on discharge. Remains in NSR on po amio. Will continue. Not on AC due to persistent hematuria. Will defer for now. Creatinine improved.   Will follow co-ox over weekend. If stable consider possible D/C on Sunday. Continue to mobilize.   Ena Dawley, MD 12/28/2016

## 2016-12-28 NOTE — Progress Notes (Addendum)
[address]       [place] [address]             [phone]      24 Days Post-Op Procedure(s) (LRB): CORONARY ARTERY BYPASS GRAFTING (CABG)x 4 WITH ENDOSCOPIC HARVESTING OF RIGHT SAPHENOUS VEIN (N/A) CLIPPING OF ATRIAL APPENDAGE USING ATRICURE 45 ATRICLIP (Left) INTRAOPERATIVE TRANSESOPHAGEAL ECHOCARDIOGRAM (N/A) MAZE PROCEDURE (N/A) Subjective: Feels fairly well, c/w yesterday- better  Objective: Vital signs in last 24 hours: Temp:  [97.6 F (36.4 C)-98.4 F (36.9 C)] 98.4 F (36.9 C) (05/27 0523) Pulse Rate:  [68-70] 70 (05/27 0523) Cardiac Rhythm: Normal sinus rhythm (05/26 2028) Resp:  [18] 18 (05/27 0523) BP: (104-121)/(48-56) 104/48 (05/27 0523) SpO2:  [94 %-98 %] 94 % (05/27 0523) Weight:  [180 lb 6.4 oz (81.8 kg)] 180 lb 6.4 oz (81.8 kg) (05/27 0523)  Hemodynamic parameters for last 24 hours:    Intake/Output from previous day: 05/26 0701 - 05/27 0700 In: 240 [P.O.:240] Out: 1550 [Stool:1550] Intake/Output this shift: No intake/output data recorded.  General appearance: alert, cooperative and no distress Heart: regular rate and rhythm Lungs: mildly dim in left base Abdomen: benign- had + BM Extremities: edema looks about the same Wound: incis healing well  Lab Results:  Recent Labs  12/27/16 0400 12/28/16 0500  WBC 7.8 7.9  HGB 9.5* 9.3*  HCT 31.2* 31.0*  PLT 329 300   BMET:  Recent Labs  12/27/16 0400 12/28/16 0500  NA 138 138  K 4.0 4.1  CL 105 105  CO2 25 28  GLUCOSE 89 88  BUN 34* 38*  CREATININE 1.21* 1.22*  CALCIUM 8.7* 8.7*    PT/INR: No results for input(s): LABPROT, INR in the last 72 hours. ABG    Component Value Date/Time   PHART 7.369 12/04/2016 2343   HCO3 27.6 12/17/2016 1752   TCO2 29 12/17/2016 1752   ACIDBASEDEF 1.0 12/04/2016 2343   O2SAT 53.8 12/28/2016 0512   CBG (last 3)   Recent Labs  12/27/16 1723 12/27/16 2052 12/28/16 0634  GLUCAP 100* 150* 90    Meds Scheduled  Meds: . amiodarone  200 mg Oral BID  . atorvastatin  40 mg Oral q1800  . Chlorhexidine Gluconate Cloth  6 each Topical Daily  . clotrimazole  1 Applicatorful Vaginal QHS  . feeding supplement (ENSURE ENLIVE)  237 mL Oral BID BM  . folic acid  1 mg Oral Daily  . furosemide  40 mg Oral BID  . glipiZIDE  5 mg Oral Q breakfast  . insulin aspart  0-15 Units Subcutaneous TID WC  . polyethylene glycol  17 g Oral Daily   Continuous Infusions: PRN Meds:.acetaminophen, ALPRAZolam, ALPRAZolam, alum & mag hydroxide-simeth, bisacodyl **OR** bisacodyl, magnesium hydroxide, miconazole, ondansetron (ZOFRAN) IV, oxyCODONE, sodium chloride flush, sodium phosphate, traMADol  Xrays No results found.  Assessment/Plan: S/P Procedure(s) (LRB): CORONARY ARTERY BYPASS GRAFTING (CABG)x 4 WITH ENDOSCOPIC HARVESTING OF RIGHT SAPHENOUS VEIN (N/A) CLIPPING OF ATRIAL APPENDAGE USING ATRICURE 45 ATRICLIP (Left) INTRAOPERATIVE TRANSESOPHAGEAL ECHOCARDIOGRAM (N/A) MAZE PROCEDURE (N/A)  1 steady progress, lasix increased to BID yesterday- creat is stable, BUN is increasing. Some of edema is related to poor protein status.  C0-OX decreased to 53.  2 H/H is stable 3 sugars reasonably well controlled without hypoglycemic episodes 4 no fevers or leukocytosis or UTI sx 5 + BM after fleets 6 Home when medically optimized   LOS: 31 days    [name],[name] 12/28/2016  Bowels now moving, ankle  edema present but stablized Poss home in am I have seen and examined [name] and agree with the above assessment  and plan.  Grace Isaac MD Beeper [phone] Office [phone] 12/28/2016 10:33 AM

## 2016-12-29 DIAGNOSIS — N179 Acute kidney failure, unspecified: Secondary | ICD-10-CM

## 2016-12-29 LAB — BASIC METABOLIC PANEL
Anion gap: 8 (ref 5–15)
BUN: 44 mg/dL — AB (ref 6–20)
CO2: 26 mmol/L (ref 22–32)
Calcium: 9 mg/dL (ref 8.9–10.3)
Chloride: 105 mmol/L (ref 101–111)
Creatinine, Ser: 1.39 mg/dL — ABNORMAL HIGH (ref 0.44–1.00)
GFR calc Af Amer: 44 mL/min — ABNORMAL LOW (ref >60)
GFR, EST NON AFRICAN AMERICAN: 38 mL/min — AB (ref >60)
GLUCOSE: 100 mg/dL — AB (ref 65–99)
Potassium: 4.1 mmol/L (ref 3.5–5.1)
Sodium: 139 mmol/L (ref 135–145)

## 2016-12-29 LAB — CBC
HCT: 32.3 % — ABNORMAL LOW (ref 36.0–46.0)
Hemoglobin: 9.5 g/dL — ABNORMAL LOW (ref 12.0–15.0)
MCH: 26.6 pg (ref 26.0–34.0)
MCHC: 29.4 g/dL — ABNORMAL LOW (ref 30.0–36.0)
MCV: 90.5 fL (ref 78.0–100.0)
PLATELETS: 293 10*3/uL (ref 150–400)
RBC: 3.57 MIL/uL — AB (ref 3.87–5.11)
RDW: 16.5 % — ABNORMAL HIGH (ref 11.5–15.5)
WBC: 7.2 10*3/uL (ref 4.0–10.5)

## 2016-12-29 LAB — GLUCOSE, CAPILLARY
GLUCOSE-CAPILLARY: 105 mg/dL — AB (ref 65–99)
GLUCOSE-CAPILLARY: 85 mg/dL (ref 65–99)
Glucose-Capillary: 111 mg/dL — ABNORMAL HIGH (ref 65–99)
Glucose-Capillary: 152 mg/dL — ABNORMAL HIGH (ref 65–99)

## 2016-12-29 LAB — COOXEMETRY PANEL
Carboxyhemoglobin: 1.2 % (ref 0.5–1.5)
METHEMOGLOBIN: 1.2 % (ref 0.0–1.5)
O2 Saturation: 50.7 %
TOTAL HEMOGLOBIN: 10.2 g/dL — AB (ref 12.0–16.0)

## 2016-12-29 MED ORDER — MILRINONE LACTATE IN DEXTROSE 20-5 MG/100ML-% IV SOLN
0.2500 ug/kg/min | INTRAVENOUS | Status: DC
  Administered 2016-12-29 – 2017-01-01 (×5): 0.25 ug/kg/min via INTRAVENOUS
  Filled 2016-12-29 (×5): qty 100

## 2016-12-29 MED ORDER — POTASSIUM CHLORIDE CRYS ER 10 MEQ PO TBCR
20.0000 meq | EXTENDED_RELEASE_TABLET | Freq: Two times a day (BID) | ORAL | Status: DC
  Administered 2016-12-29 – 2017-01-02 (×10): 20 meq via ORAL
  Filled 2016-12-29 (×12): qty 2

## 2016-12-29 MED ORDER — SPIRONOLACTONE 25 MG PO TABS
12.5000 mg | ORAL_TABLET | Freq: Every day | ORAL | Status: DC
  Administered 2016-12-29 – 2017-01-03 (×6): 12.5 mg via ORAL
  Filled 2016-12-29 (×6): qty 1

## 2016-12-29 MED ORDER — FUROSEMIDE 10 MG/ML IJ SOLN
80.0000 mg | Freq: Two times a day (BID) | INTRAMUSCULAR | Status: DC
  Administered 2016-12-29: 80 mg via INTRAVENOUS
  Filled 2016-12-29: qty 8

## 2016-12-29 NOTE — Progress Notes (Addendum)
[address]       [place] [address]             [phone]      25 Days Post-Op Procedure(s) (LRB): CORONARY ARTERY BYPASS GRAFTING (CABG)x 4 WITH ENDOSCOPIC HARVESTING OF RIGHT SAPHENOUS VEIN (N/A) CLIPPING OF ATRIAL APPENDAGE USING ATRICURE 45 ATRICLIP (Left) INTRAOPERATIVE TRANSESOPHAGEAL ECHOCARDIOGRAM (N/A) MAZE PROCEDURE (N/A) Subjective: Feels ok, notes that her edema is worse today, CO-ox is now 50 and creat has bumped to 1.39 from 1.22   Objective: Vital signs in last 24 hours: Temp:  [97.5 F (36.4 C)-98.6 F (37 C)] 98.6 F (37 C) (05/28 0418) Pulse Rate:  [63-68] 66 (05/28 0800) Cardiac Rhythm: Bundle branch block;Heart block (05/28 0721) Resp:  [18-20] 18 (05/28 0418) BP: (102-122)/(48-61) 122/61 (05/28 0800) SpO2:  [92 %-98 %] 92 % (05/28 0418) Weight:  [181 lb 14.4 oz (82.5 kg)] 181 lb 14.4 oz (82.5 kg) (05/28 0418)  Hemodynamic parameters for last 24 hours:    Intake/Output from previous day: 05/27 0701 - 05/28 0700 In: 240 [P.O.:240] Out: 600 [Urine:600] Intake/Output this shift: No intake/output data recorded.  General appearance: alert, cooperative and no distress Heart: regular rate and rhythm Lungs: mildly dim in the left base Abdomen: benign Extremities: edema is worse today Wound: incis healing well  Lab Results:  Recent Labs  12/28/16 0500 12/29/16 0443  WBC 7.9 7.2  HGB 9.3* 9.5*  HCT 31.0* 32.3*  PLT 300 293   BMET:  Recent Labs  12/28/16 0500 12/29/16 0443  NA 138 139  K 4.1 4.1  CL 105 105  CO2 28 26  GLUCOSE 88 100*  BUN 38* 44*  CREATININE 1.22* 1.39*  CALCIUM 8.7* 9.0    PT/INR: No results for input(s): LABPROT, INR in the last 72 hours. ABG    Component Value Date/Time   PHART 7.369 12/04/2016 2343   HCO3 27.6 12/17/2016 1752   TCO2 29 12/17/2016 1752   ACIDBASEDEF 1.0 12/04/2016 2343   O2SAT 50.7 12/29/2016 0455   CBG (last 3)   Recent Labs  12/28/16 1700 12/28/16 2134  12/29/16 0616  GLUCAP 83 116* 105*    Meds Scheduled Meds: . amiodarone  200 mg Oral BID  . atorvastatin  40 mg Oral q1800  . Chlorhexidine Gluconate Cloth  6 each Topical Daily  . clotrimazole  1 Applicatorful Vaginal QHS  . feeding supplement (ENSURE ENLIVE)  237 mL Oral BID BM  . folic acid  1 mg Oral Daily  . furosemide  40 mg Oral BID  . glipiZIDE  5 mg Oral Q breakfast  . insulin aspart  0-15 Units Subcutaneous TID WC  . polyethylene glycol  17 g Oral Daily   Continuous Infusions: PRN Meds:.acetaminophen, ALPRAZolam, ALPRAZolam, alum & mag hydroxide-simeth, bisacodyl **OR** bisacodyl, magnesium hydroxide, miconazole, ondansetron (ZOFRAN) IV, oxyCODONE, sodium chloride flush, sodium phosphate, traMADol  Xrays No results found.  Assessment/Plan: S/P Procedure(s) (LRB): CORONARY ARTERY BYPASS GRAFTING (CABG)x 4 WITH ENDOSCOPIC HARVESTING OF RIGHT SAPHENOUS VEIN (N/A) CLIPPING OF ATRIAL APPENDAGE USING ATRICURE 45 ATRICLIP (Left) INTRAOPERATIVE TRANSESOPHAGEAL ECHOCARDIOGRAM (N/A) MAZE PROCEDURE (N/A)  1 edema is worse today and creat bumped a little on BID lasix currently. Will see what AHF team says prior to decision on discharge.  2 CBC is stble without leukocytosis 3 sugars adeq controlled 4 cont rehab/pulm toilet   LOS: 32 days    GOLD,WAYNE E 12/29/2016  Cox 50, increased edema today Home when chf controlled  I have seen and examined [name] and agree with the above assessment  and plan.  Grace Isaac MD Beeper [phone] Office [phone] 12/29/2016 9:43 AM

## 2016-12-29 NOTE — Progress Notes (Signed)
Occupational Therapy Treatment Patient Details Name: [name] MRN: [id number] DOB: 08/07/1945 Today's Date: 12/29/2016    History of present illness 71 yo s/p NSTEMI with CABG 5/3. PMhx: CAD, CHF, VT, orthostatic hypotension, ICD.  Transferred to ICU on due to cardiogenic shock on 5/16 with lower HGBs.  Pt also noticed to have clots in her urine, so heparin stopped and CT of abdomen ordered which did not show any acute processes in the abdomen, but did show bil pleural effusions.     OT comments  This 71 yo female admitted and underwent above with increased length of stay (1 month 2 days) presents to acute OT at continued level of min guard to S level for basic ADLs. She will continue to benefit from acute OT with follow up [place] and A of her family prn.   Follow Up Recommendations  Home health OT;Supervision/Assistance - 24 hour    Equipment Recommendations  3 in 1 bedside commode       Precautions / Restrictions Precautions Precautions: Fall;Sternal Restrictions Weight Bearing Restrictions:  (sternal precautions) Other Position/Activity Restrictions: pt needed one cue to not break her sternal precautions and this was when she was scooting back in the recliner       Mobility Bed Mobility               General bed mobility comments: Pt sitting EOB upon my arrival  Transfers Overall transfer level: Needs assistance Equipment used: Rolling walker (2 wheeled) Transfers: Sit to/from Stand Sit to Stand: Min guard              Balance Overall balance assessment: Needs assistance Sitting-balance support: No upper extremity supported;Feet supported Sitting balance-Leahy Scale: Good     Standing balance support: No upper extremity supported;During functional activity Standing balance-Leahy Scale: Fair Standing balance comment: standing at sink to wash hands                           ADL either performed or assessed with clinical judgement   ADL  Overall ADL's : Needs assistance/impaired     Grooming: Wash/dry hands;Min guard;Standing               Lower Body Dressing: Min guard;Sit to/from stand   Toilet Transfer: Min guard;Ambulation;BSC Toilet Transfer Details (indicate cue type and reason): over toilet, pt had one "bobble" that she corrected while ambulating back from the bathroom ("it's because I have a bad knee") Toileting- Clothing Manipulation and Hygiene: Min guard;Sit to/from stand         General ADL Comments: Pt able to state energy conservation technique of pacing herself. I continued to educate her own other concepts (breaking down tasks, sitting to do as much as she can v. standing, using a hand held shower head while seated to shower)     Vision Patient Visual Report: No change from baseline            Cognition Arousal/Alertness: Awake/alert Behavior During Therapy: WFL for tasks assessed/performed Overall Cognitive Status: Within Functional Limits for tasks assessed                                                     Pertinent Vitals/ Pain       Pain Assessment: No/denies pain  Frequency  Min 2X/week        Progress Toward Goals  OT Goals(current goals can now be found in the care plan section)  Progress towards OT goals:  (Pt continues at [place] guard to S level for basic ADLs)     Plan Discharge plan remains appropriate       AM-PAC PT "6 Clicks" Daily Activity     Outcome Measure   Help from another person eating meals?: None Help from another person taking care of personal grooming?: A Little Help from another person toileting, which includes using toliet, bedpan, or urinal?: A Little Help from another person bathing (including washing, rinsing, drying)?: A Little Help from another person to put on and taking off regular upper body clothing?: None Help from another person to put on and taking off regular lower body clothing?: A Little 6 Click  Score: 20    End of Session Equipment Utilized During Treatment: Rolling walker  OT Visit Diagnosis: Unsteadiness on feet (R26.81);History of falling (Z91.81)   Activity Tolerance Patient tolerated treatment well   Patient Left in chair;with call bell/phone within reach;with chair alarm set   Nurse Communication          Time: 2811-8867 OT Time Calculation (min): 29 min  Charges: OT General Charges $OT Visit: 1 Procedure OT Treatments $Self Care/Home Management : 23-37 mins  [place], OTR/L [phone] 12/29/2016

## 2016-12-29 NOTE — Progress Notes (Signed)
Advanced Heart Failure Rounding Note   Subjective:    Feels worse today. More fatigued. + DOE. Co-ox continues to drop. Now down to 50%. Weight climbing again. Creatinine worse.   Maintaining NSR.   Objective:     Vital Signs:   Temp:  [97.5 F (36.4 C)-98.6 F (37 C)] 98.6 F (37 C) (05/28 0418) Pulse Rate:  [63-68] 66 (05/28 0800) Resp:  [18-20] 18 (05/28 0418) BP: (102-122)/(48-61) 122/61 (05/28 0800) SpO2:  [92 %-98 %] 92 % (05/28 0418) Weight:  [82.5 kg (181 lb 14.4 oz)] 82.5 kg (181 lb 14.4 oz) (05/28 0418) Last BM Date: 12/27/16  Weight change: Filed Weights   12/27/16 0437 12/28/16 0523 12/29/16 0418  Weight: 81.1 kg (178 lb 11.2 oz) 81.8 kg (180 lb 6.4 oz) 82.5 kg (181 lb 14.4 oz)    Intake/Output:   Intake/Output Summary (Last 24 hours) at 12/29/16 0947 Last data filed at 12/29/16 0424  Gross per 24 hour  Intake              240 ml  Output              600 ml  Net             -360 ml    Physical Exam: General:  Sitting in chair  Fatigued appearing. No resp difficulty HEENT: normal Neck: supple. JVP jaw. Carotids 2+ bilat; no bruits. No lymphadenopathy or thryomegaly appreciated. Cor: PMI nondisplaced. Regular rate & rhythm. No rubs, gallops or murmurs. Sternotomy scar ok. Lungs: clear Abdomen: obese soft, nontender, nondistended. No hepatosplenomegaly. No bruits or masses. Good bowel sounds. Extremities: no cyanosis, clubbing, rash, 2+ edema Neuro: alert & orientedx3, cranial nerves grossly intact. moves all 4 extremities w/o difficulty. Affect pleasant  Telemetry: NSR 70-80s Personally reviewed   Labs: Basic Metabolic Panel:  Recent Labs Lab 12/25/16 0554 12/26/16 0429 12/27/16 0400 12/28/16 0500 12/29/16 0443  NA 133* 136 138 138 139  K 4.7 3.9 4.0 4.1 4.1  CL 101 104 105 105 105  CO2 25 25 25 28 26   GLUCOSE 71 105* 89 88 100*  BUN 40* 36* 34* 38* 44*  CREATININE 1.73* 1.48* 1.21* 1.22* 1.39*  CALCIUM 8.3* 8.5* 8.7* 8.7* 9.0     Liver Function Tests: No results for input(s): AST, ALT, ALKPHOS, BILITOT, PROT, ALBUMIN in the last 168 hours. No results for input(s): LIPASE, AMYLASE in the last 168 hours. No results for input(s): AMMONIA in the last 168 hours.  CBC:  Recent Labs Lab 12/25/16 0554 12/26/16 0429 12/27/16 0400 12/28/16 0500 12/29/16 0443  WBC 7.4 7.8 7.8 7.9 7.2  HGB 9.5* 9.7* 9.5* 9.3* 9.5*  HCT 31.2* 31.2* 31.2* 31.0* 32.3*  MCV 88.1 88.4 89.7 90.1 90.5  PLT 359 357 329 300 293   Cardiac Enzymes: No results for input(s): CKTOTAL, CKMB, CKMBINDEX, TROPONINI in the last 168 hours.  BNP: BNP (last 3 results)  Recent Labs  11/27/16 0159 12/17/16 1255  BNP 920.0* 1,062.6*   ProBNP (last 3 results) No results for input(s): PROBNP in the last 8760 hours.  Other results:  Imaging: No results found.   Medications:     Scheduled Medications: . amiodarone  200 mg Oral BID  . atorvastatin  40 mg Oral q1800  . Chlorhexidine Gluconate Cloth  6 each Topical Daily  . clotrimazole  1 Applicatorful Vaginal QHS  . feeding supplement (ENSURE ENLIVE)  237 mL Oral BID BM  . folic acid  1 mg Oral  Daily  . furosemide  40 mg Oral BID  . glipiZIDE  5 mg Oral Q breakfast  . insulin aspart  0-15 Units Subcutaneous TID WC  . polyethylene glycol  17 g Oral Daily    Infusions:   PRN Medications: acetaminophen, ALPRAZolam, ALPRAZolam, alum & mag hydroxide-simeth, bisacodyl **OR** bisacodyl, magnesium hydroxide, miconazole, ondansetron (ZOFRAN) IV, oxyCODONE, sodium chloride flush, sodium phosphate, traMADol   Assessment/Plan/Discussion    1. Acute on chronic systolic HF -> Cardiogenic Shock - She feels worse today. Co-ox down to 50%. Volume status up. Creatinine worse - Will restart milrinone and IV lasix - She is acting much worse than echo suggests with EF 40-45% and relatively normal RV function. I am worried she may have amyloid. Unfortunately can't get cMRI due to ICD. Will get  limited echo - Losartan stopped due to low BP. Will not add dig given concern over possible amyloid - can reconsider as needed. No b-blocker with low output 2.  Left pleural effusion - s/p Thoracentesis 12/22/16 with 1.2 L out.  - CXR 5/23 with mild improvement. Stable 3. 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  - Surgery following. Stable. No change.  3. PAF - Remains in NSR on po amio 200 mg BID for now.  - Off heparin due to hematuria. - Will continue current regimen 4. Hematuria - CT ab negative. Has Foley for irrigation. - Received 1 UPRBC 12/21/16 with improvement of Hgb. (Total of 3 UPRBCs thus far) - Resolved. 5. AKI: - Creatinine 1.1 -> 1.4 -> 1.6. -> 1.55 -> 1.73 -> 1.48-> 1.2 -> 1.39. - Likely cardiorenal/low output. Restarting milrinone   6. UTI: -  On bactrim 5/14 through 5/16. Switched to rocephin 5/16 . Now off. Urine CX + EColi  - Blood cultures NGTD 7. Deconditioning - Continue to mobilize.    8. Symptomatic anemia - Improved s/p transfusion 12/21/16   - Stable Hgb. No bleeding.    Glori Bickers, MD 12/29/2016

## 2016-12-29 NOTE — Progress Notes (Signed)
Patient refused to fabulate in the hall, patient educated on the need to ambulate, she verbalized understanding, will continue to monitor.

## 2016-12-30 ENCOUNTER — Inpatient Hospital Stay (HOSPITAL_COMMUNITY): Payer: PPO

## 2016-12-30 DIAGNOSIS — I509 Heart failure, unspecified: Secondary | ICD-10-CM

## 2016-12-30 LAB — BASIC METABOLIC PANEL
ANION GAP: 9 (ref 5–15)
BUN: 46 mg/dL — ABNORMAL HIGH (ref 6–20)
CALCIUM: 9 mg/dL (ref 8.9–10.3)
CO2: 25 mmol/L (ref 22–32)
CREATININE: 1.32 mg/dL — AB (ref 0.44–1.00)
Chloride: 105 mmol/L (ref 101–111)
GFR, EST AFRICAN AMERICAN: 47 mL/min — AB (ref >60)
GFR, EST NON AFRICAN AMERICAN: 40 mL/min — AB (ref >60)
GLUCOSE: 131 mg/dL — AB (ref 65–99)
Potassium: 4.3 mmol/L (ref 3.5–5.1)
Sodium: 139 mmol/L (ref 135–145)

## 2016-12-30 LAB — CBC
HEMATOCRIT: 29.3 % — AB (ref 36.0–46.0)
HEMOGLOBIN: 8.8 g/dL — AB (ref 12.0–15.0)
MCH: 26.8 pg (ref 26.0–34.0)
MCHC: 30 g/dL (ref 30.0–36.0)
MCV: 89.3 fL (ref 78.0–100.0)
Platelets: 263 10*3/uL (ref 150–400)
RBC: 3.28 MIL/uL — ABNORMAL LOW (ref 3.87–5.11)
RDW: 16.1 % — ABNORMAL HIGH (ref 11.5–15.5)
WBC: 6.9 10*3/uL (ref 4.0–10.5)

## 2016-12-30 LAB — GLUCOSE, CAPILLARY
GLUCOSE-CAPILLARY: 95 mg/dL (ref 65–99)
Glucose-Capillary: 122 mg/dL — ABNORMAL HIGH (ref 65–99)
Glucose-Capillary: 102 mg/dL — ABNORMAL HIGH (ref 65–99)
Glucose-Capillary: 182 mg/dL — ABNORMAL HIGH (ref 65–99)

## 2016-12-30 LAB — ECHOCARDIOGRAM LIMITED
HEIGHTINCHES: 64 in
Weight: 2902.4 oz

## 2016-12-30 LAB — COOXEMETRY PANEL
Carboxyhemoglobin: 1.4 % (ref 0.5–1.5)
Methemoglobin: 1.4 % (ref 0.0–1.5)
O2 Saturation: 58 %
Total hemoglobin: 9 g/dL — ABNORMAL LOW (ref 12.0–16.0)

## 2016-12-30 MED ORDER — METOLAZONE 5 MG PO TABS
5.0000 mg | ORAL_TABLET | Freq: Once | ORAL | Status: AC
  Administered 2016-12-30: 5 mg via ORAL
  Filled 2016-12-30: qty 1

## 2016-12-30 MED ORDER — LOSARTAN POTASSIUM 25 MG PO TABS
12.5000 mg | ORAL_TABLET | Freq: Every day | ORAL | Status: DC
  Administered 2016-12-30 – 2017-01-01 (×3): 12.5 mg via ORAL
  Filled 2016-12-30 (×3): qty 1

## 2016-12-30 MED ORDER — FUROSEMIDE 10 MG/ML IJ SOLN
80.0000 mg | Freq: Three times a day (TID) | INTRAMUSCULAR | Status: DC
  Administered 2016-12-30 – 2017-01-01 (×7): 80 mg via INTRAVENOUS
  Filled 2016-12-30 (×7): qty 8

## 2016-12-30 MED ORDER — DOCUSATE SODIUM 100 MG PO CAPS
200.0000 mg | ORAL_CAPSULE | Freq: Every day | ORAL | Status: DC
  Administered 2016-12-30 – 2017-01-08 (×9): 200 mg via ORAL
  Filled 2016-12-30 (×10): qty 2

## 2016-12-30 NOTE — Progress Notes (Addendum)
[name]       [name],[place] [address]             [phone]      26 Days Post-Op Procedure(s) (LRB): CORONARY ARTERY BYPASS GRAFTING (CABG)x 4 WITH ENDOSCOPIC HARVESTING OF RIGHT SAPHENOUS VEIN (N/A) CLIPPING OF ATRIAL APPENDAGE USING ATRICURE 45 ATRICLIP (Left) INTRAOPERATIVE TRANSESOPHAGEAL ECHOCARDIOGRAM (N/A) MAZE PROCEDURE (N/A) Subjective: Feels better  Objective: Vital signs in last 24 hours: Temp:  [97.5 F (36.4 C)-98.8 F (37.1 C)] 98.8 F (37.1 C) (05/29 0555) Pulse Rate:  [66-76] 76 (05/29 0555) Cardiac Rhythm: Normal sinus rhythm;Bundle branch block;Heart block (05/28 1900) Resp:  [18-20] 18 (05/29 0555) BP: (113-130)/(48-62) 130/57 (05/29 0555) SpO2:  [95 %-99 %] 95 % (05/29 0555) Weight:  [181 lb 6.4 oz (82.3 kg)] 181 lb 6.4 oz (82.3 kg) (05/29 0555)  Hemodynamic parameters for last 24 hours:    Intake/Output from previous day: 05/28 0701 - 05/29 0700 In: 980 [P.O.:980] Out: 2850 [Urine:2850] Intake/Output this shift: No intake/output data recorded.  General appearance: alert, cooperative and no distress Heart: regular rate and rhythm and soft systolic murmur Lungs: clear to auscultation bilaterally Abdomen: benign Extremities: edema is improved Wound: incis healing well  Lab Results:  Recent Labs  12/29/16 0443 12/30/16 0423  WBC 7.2 6.9  HGB 9.5* 8.8*  HCT 32.3* 29.3*  PLT 293 263   BMET:  Recent Labs  12/28/16 0500 12/29/16 0443  NA 138 139  K 4.1 4.1  CL 105 105  CO2 28 26  GLUCOSE 88 100*  BUN 38* 44*  CREATININE 1.22* 1.39*  CALCIUM 8.7* 9.0    PT/INR: No results for input(s): LABPROT, INR in the last 72 hours. ABG    Component Value Date/Time   PHART 7.369 12/04/2016 2343   HCO3 27.6 12/17/2016 1752   TCO2 29 12/17/2016 1752   ACIDBASEDEF 1.0 12/04/2016 2343   O2SAT 58.0 12/30/2016 0430   CBG (last 3)   Recent Labs  12/29/16 1614 12/29/16 2143 12/30/16 0651  GLUCAP 85 152* 95     Meds Scheduled Meds: . amiodarone  200 mg Oral BID  . atorvastatin  40 mg Oral q1800  . Chlorhexidine Gluconate Cloth  6 each Topical Daily  . clotrimazole  1 Applicatorful Vaginal QHS  . feeding supplement (ENSURE ENLIVE)  237 mL Oral BID BM  . folic acid  1 mg Oral Daily  . furosemide  80 mg Intravenous TID  . glipiZIDE  5 mg Oral Q breakfast  . insulin aspart  0-15 Units Subcutaneous TID WC  . polyethylene glycol  17 g Oral Daily  . potassium chloride  20 mEq Oral BID  . spironolactone  12.5 mg Oral Daily   Continuous Infusions: . milrinone 0.25 mcg/kg/min (12/30/16 0017)   PRN Meds:.acetaminophen, ALPRAZolam, ALPRAZolam, alum & mag hydroxide-simeth, bisacodyl **OR** bisacodyl, magnesium hydroxide, miconazole, ondansetron (ZOFRAN) IV, oxyCODONE, sodium chloride flush, sodium phosphate, traMADol  Xrays No results found.  Assessment/Plan: S/P Procedure(s) (LRB): CORONARY ARTERY BYPASS GRAFTING (CABG)x 4 WITH ENDOSCOPIC HARVESTING OF RIGHT SAPHENOUS VEIN (N/A) CLIPPING OF ATRIAL APPENDAGE USING ATRICURE 45 ATRICLIP (Left) INTRAOPERATIVE TRANSESOPHAGEAL ECHOCARDIOGRAM (N/A) MAZE PROCEDURE (N/A)   1 doing a little better, edema is improved,back on milrinone, lasix is at 80 tid, also on spironolactone. - recheck bmet in am- AHF team managing CHF issues 2 sugars adeq controlled 3 cont pulm toilet/rehab as able   LOS: 33 days    GOLD,WAYNE E 12/30/2016 BMET pending Placed  back on Milrinone yesterday Plan per AHF team  [name], MD [facility] [phone]

## 2016-12-30 NOTE — Progress Notes (Signed)
Pt refuses walk at this time.  States she would like to try again after shift change, before bed.

## 2016-12-30 NOTE — Progress Notes (Signed)
  Echocardiogram 2D Echocardiogram has been performed.  [name] 12/30/2016, 11:02 AM

## 2016-12-30 NOTE — Progress Notes (Signed)
Nutrition Follow Up  DOCUMENTATION CODES:   Obesity unspecified  INTERVENTION:    Continue Ensure Enlive po BID, each supplement provides 350 kcal and 20 grams of protein  NUTRITION DIAGNOSIS:   Inadequate oral intake related to altered GI function (decreased appetite) as evidenced by per patient/family report, meal completion < 50%, resolved  GOAL:   Patient will meet greater than or equal to 90% of their needs, met  MONITOR:   PO intake, Supplement acceptance, Labs, Weight trends, Skin, I & O's  ASSESSMENT:   [name] with hx of CAD, CHF, PAF, DM, pulmonary HTN admitted 4/26 with chest pain, s/p CABG x 5 on 5/4. [name] with nausea, finally had BM 5/7 after enema.   [name] continues on a Carbohydrate Modified diet. Appetite much improved.  PO intake 75-100% per flowsheets. Receiving Ensure Enlive supplements BID. Labs and medications reviewed. CBG's [id number].  Diet Order:  Diet Carb Modified Fluid consistency: Thin; Room service appropriate? 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  Estimated Nutritional Needs:   Kcal:  1600-1800  Protein:  95-115 grams  Fluid:  >1.7 L/day  EDUCATION NEEDS:   No education needs identified at this time  Arthur Holms, RD, LDN Pager #: [phone] After-Hours Pager #: [phone]

## 2016-12-30 NOTE — Progress Notes (Signed)
CARDIAC REHAB PHASE I   PRE:  Rate/Rhythm: 77 first deg    BP: sitting 119/57    SaO2: 95 RA  MODE:  Ambulation: 420 ft   POST:  Rate/Rhythm: 88 SR    BP: sitting 123/83     SaO2: 95 RA  Pt able to walk with RW. Many short standing rests. Overall doing well. Return to side of bed and discussed elevating feet as much as possible.  Lupus, ACSM 12/30/2016 1:22 PM

## 2016-12-30 NOTE — Care Management Important Message (Signed)
Important Message  Patient Details  Name: [name] MRN: [id number] Date of Birth: 1945-10-07   Medicare Important Message Given:  Yes    Nathen May 12/30/2016, 12:19 PM

## 2016-12-30 NOTE — Progress Notes (Signed)
Advanced Heart Failure Rounding Note   Subjective:    Milrinone restarted yesterday, co ox now improved to 58% on Milrinone 0.25 mcg. Weight unchanged, but -1.8L in the last 24 hours.   Says she feels better today, no SOB with walking. Denies orthopnea and chest pain.   Objective:     Vital Signs:   Temp:  [97.5 F (36.4 C)-98.8 F (37.1 C)] 98.8 F (37.1 C) (05/29 0555) Pulse Rate:  [66-76] 76 (05/29 0555) Resp:  [18-20] 18 (05/29 0555) BP: (113-130)/(48-62) 130/57 (05/29 0555) SpO2:  [95 %-99 %] 95 % (05/29 0555) Weight:  [181 lb 6.4 oz (82.3 kg)] 181 lb 6.4 oz (82.3 kg) (05/29 0555) Last BM Date: 12/29/16  Weight change: Filed Weights   12/28/16 0523 12/29/16 0418 12/30/16 0555  Weight: 180 lb 6.4 oz (81.8 kg) 181 lb 14.4 oz (82.5 kg) 181 lb 6.4 oz (82.3 kg)    Intake/Output:   Intake/Output Summary (Last 24 hours) at 12/30/16 0704 Last data filed at 12/30/16 0025  Gross per 24 hour  Intake              980 ml  Output             2850 ml  Net            -1870 ml    Physical Exam: General: Female, sitting up in bed. NAD.  HEENT: Normal.  Neck: supple.8-9 cm JVP. Carotids 2+ bilat; no bruits. No lymphadenopathy or thryomegaly appreciated. Cor: PMI nondisplaced. Regular rate and rhythm. No rubs, gallops or murmurs. Sternotomy scar ok. Lungs: Clear in upper lobes bilaterally, diminished crackles in bilateral bases. Normal effort.  Abdomen: obese soft, nontender, nondistended. No hepatosplenomegaly. No bruits or masses. Positive bowel sounds.  Extremities: no cyanosis, clubbing, rash. Warm. 1+ edema to knees.  Neuro: alert & orientedx3, cranial nerves grossly intact. moves all 4 extremities w/o difficulty. Affect pleasant  Telemetry: NSR, rates in the 80's.    Labs: Basic Metabolic Panel:  Recent Labs Lab 12/25/16 0554 12/26/16 0429 12/27/16 0400 12/28/16 0500 12/29/16 0443  NA 133* 136 138 138 139  K 4.7 3.9 4.0 4.1 4.1  CL 101 104 105 105 105  CO2  25 25 25 28 26   GLUCOSE 71 105* 89 88 100*  BUN 40* 36* 34* 38* 44*  CREATININE 1.73* 1.48* 1.21* 1.22* 1.39*  CALCIUM 8.3* 8.5* 8.7* 8.7* 9.0    Liver Function Tests: No results for input(s): AST, ALT, ALKPHOS, BILITOT, PROT, ALBUMIN in the last 168 hours. No results for input(s): LIPASE, AMYLASE in the last 168 hours. No results for input(s): AMMONIA in the last 168 hours.  CBC:  Recent Labs Lab 12/26/16 0429 12/27/16 0400 12/28/16 0500 12/29/16 0443 12/30/16 0423  WBC 7.8 7.8 7.9 7.2 6.9  HGB 9.7* 9.5* 9.3* 9.5* 8.8*  HCT 31.2* 31.2* 31.0* 32.3* 29.3*  MCV 88.4 89.7 90.1 90.5 89.3  PLT 357 329 300 293 263   Cardiac Enzymes: No results for input(s): CKTOTAL, CKMB, CKMBINDEX, TROPONINI in the last 168 hours.  BNP: BNP (last 3 results)  Recent Labs  11/27/16 0159 12/17/16 1255  BNP 920.0* 1,062.6*   ProBNP (last 3 results) No results for input(s): PROBNP in the last 8760 hours.  Other results:  Imaging: No results found.   Medications:     Scheduled Medications: . amiodarone  200 mg Oral BID  . atorvastatin  40 mg Oral q1800  . Chlorhexidine Gluconate Cloth  6 each Topical  Daily  . clotrimazole  1 Applicatorful Vaginal QHS  . feeding supplement (ENSURE ENLIVE)  237 mL Oral BID BM  . folic acid  1 mg Oral Daily  . furosemide  80 mg Intravenous BID  . glipiZIDE  5 mg Oral Q breakfast  . insulin aspart  0-15 Units Subcutaneous TID WC  . polyethylene glycol  17 g Oral Daily  . potassium chloride  20 mEq Oral BID  . spironolactone  12.5 mg Oral Daily    Infusions: . milrinone 0.25 mcg/kg/min (12/30/16 0017)    PRN Medications: acetaminophen, ALPRAZolam, ALPRAZolam, alum & mag hydroxide-simeth, bisacodyl **OR** bisacodyl, magnesium hydroxide, miconazole, ondansetron (ZOFRAN) IV, oxyCODONE, sodium chloride flush, sodium phosphate, traMADol   Assessment/Plan/Discussion    1. Acute on chronic systolic HF -> Cardiogenic Shock - Co ox slightly  improved on 0.3mcg Milrinone. Continue today.  - Increase lasix to 80mg  TID.  - For limited Echo today, concern for amyloid. No cMRI with ICD.  - Will not add dig given concern over possible amyloid - can reconsider as needed. No b-blocker with low output - BP improved off losartan.  - Continue spiro 12.5mg  daily.  2.  Left pleural effusion - s/p Thoracentesis 12/22/16 with 1.2 L out.  - CXR 5/23 with mild improvement.  - Stable, no changes.  3. 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  - Surgery following, no change to current plan.  3. PAF - Remains in NSR on po amio 200 mg BID for now.  - Off heparin due to hematuria. - No change to current plan.  4. Hematuria - CT ab negative.  - Received 1 UPRBC 12/21/16 with improvement of Hgb. (Total of 3 UPRBCs thus far) - Foley removed. No hematuria per patient.  5. AKI: - Creatinine 1.1 -> 1.4 -> 1.6. -> 1.55 -> 1.73 -> 1.48-> 1.2 -> 1.39->1.32 - Improving.  6. UTI: -  On bactrim 5/14 through 5/16. Switched to rocephin 5/16 . Now off. Urine CX + EColi  - Blood cultures NGTD - Continue current medical management.  7. Deconditioning - Continue to mobilize.    - has worked with PT/OT, ok to return home with home health. 8. Symptomatic anemia - Improved s/p transfusion 12/21/16   - Hgb down to 8.8 today.    Arbutus Leas, NP 12/30/2016  Patient seen and examined with [name], NP. We discussed all aspects of the encounter. I agree with the assessment and plan as stated above.   Restarted on milrinone yesterday for low output HF. Feels better but still volume overloaded. Renal function improved.  Will increase IV lasix and add metolazone.   Echo viewed personally. EF 50% RV normal. Degree of HF seems out of proportion to ventricular function. I am concerned about possible amyloidosis. SPEP sent. May have TTR with asymmetric septal thickening. Unable to get cMRI with ICD. Will try  to get technetium pyrophosphate scan if possible. D/w Radiology.   Glori Bickers, MD  2:14 PM

## 2016-12-30 NOTE — Progress Notes (Signed)
Physical Therapy Treatment Patient Details Name: [name] MRN: [id number] DOB: 1945/09/11 Today's Date: 12/30/2016    History of Present Illness 71 yo s/p NSTEMI with CABG 5/3. PMhx: CAD, CHF, VT, orthostatic hypotension, ICD.  Transferred to ICU on due to cardiogenic shock on 5/16 with lower HGBs.  Pt also noticed to have clots in her urine, so heparin stopped and CT of abdomen ordered which did not show any acute processes in the abdomen, but did show bil pleural effusions.      PT Comments    Pt pleasant, willing to mobilize and able to tolerate gait, stairs and HEP today. Pt with cueing for bed mobility but able to recall all precautions. She continues to refuse sitting in chair but educated for need to elevate legs at rest and encouraged increased gait and HEP when not working with therapy. Will continue to follow. Pt now states she feels safe returning home with daughter to assist.   HR 76-84    Follow Up Recommendations  Home health PT;Supervision/Assistance - 24 hour     Equipment Recommendations  Rolling walker with 5" wheels;3in1 (PT)    Recommendations for Other Services       Precautions / Restrictions Precautions Precautions: Fall;Sternal Precaution Comments: Pt able to state all sternal precautions    Mobility  Bed Mobility Overal bed mobility: Needs Assistance Bed Mobility: Rolling;Sidelying to Sit Rolling: Supervision Sidelying to sit: Min guard       General bed mobility comments: cues for seqeunce with tactile cueing for transition side to sit. Pt initially trying to rotate and engage abdomen to sit but unable  Transfers Overall transfer level: Modified independent                  Ambulation/Gait Ambulation/Gait assistance: Supervision Ambulation Distance (Feet): 500 Feet Assistive device: Rolling walker (2 wheeled) Gait Pattern/deviations: Step-through pattern;Decreased stride length Gait velocity: decreased   General Gait  Details: cues for posture and position in RW   Stairs Stairs: Yes   Stair Management: Backwards;Step to pattern;With walker Number of Stairs: 1 General stair comments: x 2 trials with cues for safety and to bring RW to the step  Wheelchair Mobility    Modified Rankin (Stroke Patients Only)       Balance Overall balance assessment: Needs assistance   Sitting balance-Leahy Scale: Good       Standing balance-Leahy Scale: Fair                              Cognition Arousal/Alertness: Awake/alert Behavior During Therapy: WFL for tasks assessed/performed Overall Cognitive Status: Within Functional Limits for tasks assessed                                        Exercises General Exercises - Lower Extremity Long Arc Quad: AROM;20 reps;Both;Seated Hip Flexion/Marching: AROM;20 reps;Both;Seated    General Comments        Pertinent Vitals/Pain Pain Assessment: No/denies pain    Home Living                      Prior Function            PT Goals (current goals can now be found in the care plan section) Acute Rehab PT Goals Patient Stated Goal: to go home  Time For Goal Achievement:  01/13/17 Potential to Achieve Goals: Fair Progress towards PT goals: Progressing toward goals;Goals met and updated - see care plan    Frequency           PT Plan Discharge plan needs to be updated    Co-evaluation              AM-PAC PT "6 Clicks" Daily Activity  Outcome Measure  Difficulty turning over in bed (including adjusting bedclothes, sheets and blankets)?: A Little Difficulty moving from lying on back to sitting on the side of the bed? : A Lot Difficulty sitting down on and standing up from a chair with arms (e.g., wheelchair, bedside commode, etc,.)?: A Little Help needed moving to and from a bed to chair (including a wheelchair)?: None Help needed walking in hospital room?: A Little Help needed climbing 3-5 steps with  a railing? : A Little 6 Click Score: 18    End of Session Equipment Utilized During Treatment: Gait belt Activity Tolerance: Patient tolerated treatment well Patient left: in bed;with call bell/phone within [name] Nurse Communication: Mobility status PT Visit Diagnosis: Muscle weakness (generalized) (M62.81);Difficulty in walking, not elsewhere classified (R26.2);Pain     Time: 9935-7017 PT Time Calculation (min) (ACUTE ONLY): 23 min  Charges:  $Gait Training: 8-22 mins $Therapeutic Exercise: 8-22 mins                    G Codes:       [name], PT [phone]   [name] 12/30/2016, 9:30 AM

## 2016-12-31 LAB — BASIC METABOLIC PANEL
Anion gap: 7 (ref 5–15)
BUN: 47 mg/dL — AB (ref 6–20)
CHLORIDE: 104 mmol/L (ref 101–111)
CO2: 28 mmol/L (ref 22–32)
CREATININE: 1.25 mg/dL — AB (ref 0.44–1.00)
Calcium: 8.8 mg/dL — ABNORMAL LOW (ref 8.9–10.3)
GFR calc Af Amer: 50 mL/min — ABNORMAL LOW (ref >60)
GFR calc non Af Amer: 43 mL/min — ABNORMAL LOW (ref >60)
GLUCOSE: 104 mg/dL — AB (ref 65–99)
Potassium: 3.8 mmol/L (ref 3.5–5.1)
SODIUM: 139 mmol/L (ref 135–145)

## 2016-12-31 LAB — PROTEIN ELECTROPHORESIS, SERUM
A/G Ratio: 0.9 (ref 0.7–1.7)
ALBUMIN ELP: 2.7 g/dL — AB (ref 2.9–4.4)
Alpha-1-Globulin: 0.3 g/dL (ref 0.0–0.4)
Alpha-2-Globulin: 0.7 g/dL (ref 0.4–1.0)
Beta Globulin: 0.9 g/dL (ref 0.7–1.3)
GAMMA GLOBULIN: 0.9 g/dL (ref 0.4–1.8)
GLOBULIN, TOTAL: 2.9 g/dL (ref 2.2–3.9)
Total Protein ELP: 5.6 g/dL — ABNORMAL LOW (ref 6.0–8.5)

## 2016-12-31 LAB — GLUCOSE, CAPILLARY
GLUCOSE-CAPILLARY: 92 mg/dL (ref 65–99)
GLUCOSE-CAPILLARY: 125 mg/dL — AB (ref 65–99)
GLUCOSE-CAPILLARY: 123 mg/dL — AB (ref 65–99)
Glucose-Capillary: 115 mg/dL — ABNORMAL HIGH (ref 65–99)

## 2016-12-31 LAB — COOXEMETRY PANEL
Carboxyhemoglobin: 1.4 % (ref 0.5–1.5)
METHEMOGLOBIN: 1.3 % (ref 0.0–1.5)
O2 Saturation: 61.5 %
TOTAL HEMOGLOBIN: 9.1 g/dL — AB (ref 12.0–16.0)

## 2016-12-31 LAB — CBC
HCT: 29.6 % — ABNORMAL LOW (ref 36.0–46.0)
HEMOGLOBIN: 9 g/dL — AB (ref 12.0–15.0)
MCH: 27.1 pg (ref 26.0–34.0)
MCHC: 30.4 g/dL (ref 30.0–36.0)
MCV: 89.2 fL (ref 78.0–100.0)
Platelets: 268 10*3/uL (ref 150–400)
RBC: 3.32 MIL/uL — ABNORMAL LOW (ref 3.87–5.11)
RDW: 16.2 % — ABNORMAL HIGH (ref 11.5–15.5)
WBC: 5.9 10*3/uL (ref 4.0–10.5)

## 2016-12-31 LAB — IMMUNOFIXATION ELECTROPHORESIS
IGM, SERUM: 145 mg/dL (ref 26–217)
IgA: 302 mg/dL (ref 87–352)
IgG (Immunoglobin G), Serum: 800 mg/dL (ref 700–1600)
TOTAL PROTEIN ELP: 5.5 g/dL — AB (ref 6.0–8.5)

## 2016-12-31 MED ORDER — METOLAZONE 5 MG PO TABS
5.0000 mg | ORAL_TABLET | Freq: Once | ORAL | Status: AC
  Administered 2016-12-31: 5 mg via ORAL
  Filled 2016-12-31: qty 1

## 2016-12-31 NOTE — Progress Notes (Addendum)
Pt didn't recall being weighed this morning. Weighed again s/p breakfast and some po liquids and current weight recorded in Epic  Pt c/o pain in mid chest around L breast.  Breast binder applied for comfort.

## 2016-12-31 NOTE — Consult Note (Addendum)
   [facility] Inpatient Consult   12/31/2016  Earth 04/03/1946 [id number]    [facility] Care Management follow up.   Spoke with Ms. Willits extensively at bedside to discuss and offer [place] Management program services as a benefit of her [employer] insurance. She is agreeable. Written [facility] Care Management consent obtained and packet provided.   Ms. Rainone reports she will be returning home with her daughter and grandson. States she no longer needs SNF and is anxious to return home. States she will have 24/7 assistance at home. 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 Discussed that there are co-pays associated with home health services. Discussed that she will receive post hospital discharge calls and will be evaluated for monthly home visits from [facility]. Explained that [facility] services will not interfere or replace home health should she have home health arranged.   Confirms Primary Care MD is Dr. Jarold Song. She also gets her medications thru the [place]. She states she is not sure what medications she will be going home on. Made her aware that if further pharmacy follow up or pharmacy assistance is needed, she could be referred to [facility] Pharmacist. Also discussed [facility] LCSW services if needed for transportation or community resources. Currently denies having transportation, community resources, or medication needs at this time. Therefore no additional [facility] referrals made.  Confirmed best contact number as [phone].  Will make inpatient RNCM aware [place] Management will follow.  Will refer to [facility] for CHF disease and symptom management. History of CHF, CAD, VT, ICD, HTN, DM, cardiogenic shock, NSTEMI, s/p CABG.     [name], MSN-Ed, RN,BSN [facility] Liaison [phone]

## 2016-12-31 NOTE — Progress Notes (Signed)
      [address]       [place] [address]             [phone]      27 Days Post-Op Procedure(s) (LRB): CORONARY ARTERY BYPASS GRAFTING (CABG)x 4 WITH ENDOSCOPIC HARVESTING OF RIGHT SAPHENOUS VEIN (N/A) CLIPPING OF ATRIAL APPENDAGE USING ATRICURE 45 ATRICLIP (Left) INTRAOPERATIVE TRANSESOPHAGEAL ECHOCARDIOGRAM (N/A) MAZE PROCEDURE (N/A) Subjective: Feels ok, no new c/o  Objective: Vital signs in last 24 hours: Temp:  [97.5 F (36.4 C)-98 F (36.7 C)] 98 F (36.7 C) (05/30 0458) Pulse Rate:  [73-84] 73 (05/30 0458) Cardiac Rhythm: Heart block (05/29 1921) Resp:  [18-20] 18 (05/30 0458) BP: (101-126)/(49-75) 101/49 (05/30 0458) SpO2:  [94 %-96 %] 96 % (05/30 0458) Weight:  [181 lb 6.4 oz (82.3 kg)] 181 lb 6.4 oz (82.3 kg) (05/30 0458)  Hemodynamic parameters for last 24 hours:    Intake/Output from previous day: 05/29 0701 - 05/30 0700 In: 730 [P.O.:720; I.V.:10] Out: 2100 [Urine:2100] Intake/Output this shift: No intake/output data recorded.  General appearance: alert, cooperative and no distress Heart: regular rate and rhythm Lungs: clear to auscultation bilaterally Abdomen: benign Extremities: edema is improved Wound: incis healing well  Lab Results:  Recent Labs  12/30/16 0423 12/31/16 0514  WBC 6.9 5.9  HGB 8.8* 9.0*  HCT 29.3* 29.6*  PLT 263 268   BMET:  Recent Labs  12/30/16 0831 12/31/16 0514  NA 139 139  K 4.3 3.8  CL 105 104  CO2 25 28  GLUCOSE 131* 104*  BUN 46* 47*  CREATININE 1.32* 1.25*  CALCIUM 9.0 8.8*    PT/INR: No results for input(s): LABPROT, INR in the last 72 hours. ABG    Component Value Date/Time   PHART 7.369 12/04/2016 2343   HCO3 27.6 12/17/2016 1752   TCO2 29 12/17/2016 1752   ACIDBASEDEF 1.0 12/04/2016 2343   O2SAT 61.5 12/31/2016 0525   CBG (last 3)   Recent Labs  12/30/16 1612 12/30/16 2053 12/31/16 0645  GLUCAP 102* 182* 92    Meds Scheduled Meds: . amiodarone  200 mg Oral  BID  . atorvastatin  40 mg Oral q1800  . Chlorhexidine Gluconate Cloth  6 each Topical Daily  . docusate sodium  200 mg Oral Daily  . feeding supplement (ENSURE ENLIVE)  237 mL Oral BID BM  . folic acid  1 mg Oral Daily  . furosemide  80 mg Intravenous TID  . glipiZIDE  5 mg Oral Q breakfast  . insulin aspart  0-15 Units Subcutaneous TID WC  . losartan  12.5 mg Oral QHS  . polyethylene glycol  17 g Oral Daily  . potassium chloride  20 mEq Oral BID  . spironolactone  12.5 mg Oral Daily   Continuous Infusions: . milrinone 0.25 mcg/kg/min (12/31/16 0700)   PRN Meds:.acetaminophen, ALPRAZolam, ALPRAZolam, alum & mag hydroxide-simeth, bisacodyl **OR** bisacodyl, magnesium hydroxide, miconazole, ondansetron (ZOFRAN) IV, oxyCODONE, sodium chloride flush, sodium phosphate, traMADol  Xrays No results found.  Assessment/Plan: S/P Procedure(s) (LRB): CORONARY ARTERY BYPASS GRAFTING (CABG)x 4 WITH ENDOSCOPIC HARVESTING OF RIGHT SAPHENOUS VEIN (N/A) CLIPPING OF ATRIAL APPENDAGE USING ATRICURE 45 ATRICLIP (Left) INTRAOPERATIVE TRANSESOPHAGEAL ECHOCARDIOGRAM (N/A) MAZE PROCEDURE (N/A)  1 conts to improve 2 co-ox 61 on current management- plans per AHF team 3 sugars well controlled 4 creat improved, BUN rising 5 echo results pending 6 Sinus rhythm with pvc's 7 On K+ supplement  LOS: 34 days    [name],[name] 12/31/2016

## 2016-12-31 NOTE — Progress Notes (Signed)
Advanced Heart Failure Rounding Note   Subjective:    Co ox 62% on Milrinone 0.25 mcg. Weight unchanged.   Creatinine 1.22->1.39->1.32->1.25.   Feels well today, denies chest pain and orthopnea. She is up and bathing herself without SOB.   Objective:     Vital Signs:   Temp:  [97.5 F (36.4 C)-98 F (36.7 C)] 98 F (36.7 C) (05/30 0458) Pulse Rate:  [73-84] 73 (05/30 0458) Resp:  [18-20] 18 (05/30 0458) BP: (101-126)/(49-75) 101/49 (05/30 0458) SpO2:  [94 %-96 %] 96 % (05/30 0458) Weight:  [181 lb 6.4 oz (82.3 kg)] 181 lb 6.4 oz (82.3 kg) (05/30 0458) Last BM Date: 12/29/16  Weight change: Filed Weights   12/29/16 0418 12/30/16 0555 12/31/16 0458  Weight: 181 lb 14.4 oz (82.5 kg) 181 lb 6.4 oz (82.3 kg) 181 lb 6.4 oz (82.3 kg)    Intake/Output:   Intake/Output Summary (Last 24 hours) at 12/31/16 0903 Last data filed at 12/31/16 0518  Gross per 24 hour  Intake              490 ml  Output             2100 ml  Net            -1610 ml    Physical Exam: General: NAD. Sitting on edge of bed.   HEENT: Normal.  Neck: supple. No JVD. Carotids 2+ bilat; no bruits. No lymphadenopathy or thryomegaly appreciated. Cor: PMI nondisplaced. Regular rate and rhythm. No rubs, gallops or murmurs. Lungs: clear to ausculation bilaterally. Normal effort.  Abdomen: obese soft, nontender, nondistended. No hepatosplenomegaly. No bruits or masses. Positive bowel sounds.  Extremities: no cyanosis, clubbing, rash.Warm. Edema improved, still with some trace pretibial edema.   Neuro: alert & orientedx3, cranial nerves grossly intact. moves all 4 extremities w/o difficulty. Affect pleasant  Telemetry: NSR, rates 80's.    Labs: Basic Metabolic Panel:  Recent Labs Lab 12/27/16 0400 12/28/16 0500 12/29/16 0443 12/30/16 0831 12/31/16 0514  NA 138 138 139 139 139  K 4.0 4.1 4.1 4.3 3.8  CL 105 105 105 105 104  CO2 25 28 26 25 28   GLUCOSE 89 88 100* 131* 104*  BUN 34* 38* 44* 46*  47*  CREATININE 1.21* 1.22* 1.39* 1.32* 1.25*  CALCIUM 8.7* 8.7* 9.0 9.0 8.8*    Liver Function Tests: No results for input(s): AST, ALT, ALKPHOS, BILITOT, PROT, ALBUMIN in the last 168 hours. No results for input(s): LIPASE, AMYLASE in the last 168 hours. No results for input(s): AMMONIA in the last 168 hours.  CBC:  Recent Labs Lab 12/27/16 0400 12/28/16 0500 12/29/16 0443 12/30/16 0423 12/31/16 0514  WBC 7.8 7.9 7.2 6.9 5.9  HGB 9.5* 9.3* 9.5* 8.8* 9.0*  HCT 31.2* 31.0* 32.3* 29.3* 29.6*  MCV 89.7 90.1 90.5 89.3 89.2  PLT 329 300 293 263 268   Cardiac Enzymes: No results for input(s): CKTOTAL, CKMB, CKMBINDEX, TROPONINI in the last 168 hours.  BNP: BNP (last 3 results)  Recent Labs  11/27/16 0159 12/17/16 1255  BNP 920.0* 1,062.6*   ProBNP (last 3 results) No results for input(s): PROBNP in the last 8760 hours.  Other results:  Imaging: No results found.   Medications:     Scheduled Medications: . amiodarone  200 mg Oral BID  . atorvastatin  40 mg Oral q1800  . Chlorhexidine Gluconate Cloth  6 each Topical Daily  . docusate sodium  200 mg Oral Daily  . feeding  supplement (ENSURE ENLIVE)  237 mL Oral BID BM  . folic acid  1 mg Oral Daily  . furosemide  80 mg Intravenous TID  . glipiZIDE  5 mg Oral Q breakfast  . insulin aspart  0-15 Units Subcutaneous TID WC  . losartan  12.5 mg Oral QHS  . polyethylene glycol  17 g Oral Daily  . potassium chloride  20 mEq Oral BID  . spironolactone  12.5 mg Oral Daily    Infusions: . milrinone 0.25 mcg/kg/min (12/31/16 0700)    PRN Medications: acetaminophen, ALPRAZolam, ALPRAZolam, alum & mag hydroxide-simeth, bisacodyl **OR** bisacodyl, magnesium hydroxide, miconazole, ondansetron (ZOFRAN) IV, oxyCODONE, sodium chloride flush, sodium phosphate, traMADol   Assessment/Plan/Discussion    1. Acute on chronic systolic HF -> Cardiogenic Shock - Co ox stable on 0.25 milrinone.   - Weight unchanged, but -1.3L  in the last 24 hours. Got one dose of metolazone yesterday.  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 No MRI with ICD. Will talk to MD about technetium pyrophosphate scan.  - Will not add dig given concern over possible amyloid - can reconsider as needed.  - Started losartan 12.5 yesterday, BP soft this am. Will not increase.  - Continue spiro 12.5mg  daily.  - Continue lasix 80mg  TID today  2.  Left pleural effusion - s/p Thoracentesis 12/22/16 with 1.2 L out.  - CXR 5/23 with mild improvement.  - Stable, no change to current plan.  3. 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  - No change to current plan.  3. PAF - Remains in NSR on po amio 200 mg BID for now.  - Off heparin due to hematuria. - NO change to current plan.  4. Hematuria - CT ab negative.  - Received 1 UPRBC 12/21/16 with improvement of Hgb. (Total of 3 UPRBCs thus far) - Foley removed. No hematuria per patient. - Stable, no change.   5. AKI: - Creatinine 1.1 -> 1.4 -> 1.6. -> 1.55 -> 1.73 -> 1.48-> 1.2 -> 1.39->1.32->1.25 - Improving, will follow.  6. UTI: -  On bactrim 5/14 through 5/16. Switched to rocephin 5/16 . Now off. Urine CX + EColi  - Blood cultures NGTD - Denies dysuria.  7. Deconditioning - Continue to mobilize.    - has worked with PT/OT, ok to return home with home health. 8. Symptomatic anemia - Improved s/p transfusion 12/21/16   - Hgb 9 today, stable.    Arbutus Leas, NP 12/31/2016  Patient seen and examined with [name], NP. We discussed all aspects of the encounter. I agree with the assessment and plan as stated above.   She is back on milrinone due to low cardia output despite relatively normal LV and RV function on echo. Remains volume overloaded.   Will continue milrinone today and also continue IV lasix. Given one dose metolazone. BP too soft to titrate spiro or losartan.   Once diuresed will attempt to wean milrinone one  more time. If fails wean may need home milrinone.   I have discussed technetium pyrophosphate scan with Dr. Clovis Riley in Radiology.   Remains in NSR. Continue amio.   Glori Bickers, MD  3:11 PM

## 2016-12-31 NOTE — Progress Notes (Signed)
CARDIAC REHAB PHASE I   PRE:  Rate/Rhythm: 71 first degree    BP: sitting 110/54    SaO2: 96 RA  MODE:  Ambulation: 350 ft   POST:  Rate/Rhythm: 76 first degree    BP: sitting 102/50     SaO2: 96 RA  Pt eager to walk. Used RW with no assist needed but numerous rest stops, very brief. Sts she feels fatigued toward end. Pt had LOB when right leg buckled (bad knee) getting to EOB. Pt caught herself on bed. VSS, BP slightly low. Left pt on EOB. [phone]   [place], ACSM 12/31/2016 11:57 AM

## 2016-12-31 NOTE — Progress Notes (Signed)
Occupational Therapy Treatment Patient Details Name: [name] MRN: [id number] DOB: [birth year] Today's Date: 12/31/2016    History of present illness 71 yo s/p NSTEMI with CABG 5/3. PMhx: CAD, CHF, VT, orthostatic hypotension, ICD.  Transferred to ICU on due to cardiogenic shock on 5/16 with lower HGBs.  Pt also noticed to have clots in her urine, so heparin stopped and CT of abdomen ordered which did not show any acute processes in the abdomen, but did show bil pleural effusions.     OT comments  Pt performed bathing, dressing and grooming at a supervision level seated at sink. Good tolerance of activity.   Follow Up Recommendations  Home health OT;Supervision/Assistance - 24 hour    Equipment Recommendations  3 in 1 bedside commode    Recommendations for Other Services      Precautions / Restrictions Precautions Precautions: Fall;Sternal Precaution Comments: generalized sternal precautions without cues       Mobility Bed Mobility Overal bed mobility: Modified Independent                Transfers Overall transfer level: Modified independent Equipment used: Rolling walker (2 wheeled)             General transfer comment: placed hands on knees without cues     Balance     Sitting balance-Leahy Scale: Good       Standing balance-Leahy Scale: Fair Standing balance comment: no LOB with standing ADL, stabilizes on sink                           ADL either performed or assessed with clinical judgement   ADL Overall ADL's : Needs assistance/impaired     Grooming: Oral care;Wash/dry hands;Wash/dry face;Sitting;Standing;Supervision/safety   Upper Body Bathing: Set up;Sitting   Lower Body Bathing: Supervison/ safety;Sit to/from stand   Upper Body Dressing : Set up;Sitting   Lower Body Dressing: Supervision/safety;Sit to/from stand   Toilet Transfer: Supervision/safety;Ambulation;RW;BSC   Toileting- Water quality scientist and Hygiene:  Supervision/safety;Sit to/from stand       Functional mobility during ADLs: Supervision/safety;Rolling walker General ADL Comments: Pt completed entire ADL sit to stand from 3 in 1 at sink with supervision.     Vision       Perception     Praxis      Cognition Arousal/Alertness: Awake/alert Behavior During Therapy: WFL for tasks assessed/performed Overall Cognitive Status: Within Functional Limits for tasks assessed                                          Exercises     Shoulder Instructions       General Comments      Pertinent Vitals/ Pain       Pain Assessment: No/denies pain  Home Living                                          Prior Functioning/Environment              Frequency  Min 2X/week        Progress Toward Goals  OT Goals(current goals can now be found in the care plan section)  Progress towards OT goals: Progressing toward goals  Acute Rehab OT Goals Patient Stated Goal: to  go home  OT Goal Formulation: With patient Time For Goal Achievement: 01/06/17  Plan Discharge plan remains appropriate    Co-evaluation                 AM-PAC PT "6 Clicks" Daily Activity     Outcome Measure   Help from another person eating meals?: None Help from another person taking care of personal grooming?: A Little Help from another person toileting, which includes using toliet, bedpan, or urinal?: A Little Help from another person bathing (including washing, rinsing, drying)?: A Little Help from another person to put on and taking off regular upper body clothing?: None Help from another person to put on and taking off regular lower body clothing?: A Little 6 Click Score: 20    End of Session Equipment Utilized During Treatment: Rolling walker  OT Visit Diagnosis: Unsteadiness on feet (R26.81);History of falling (Z91.81)   Activity Tolerance Patient tolerated treatment well   Patient Left in bed;with  call bell/phone within reach   Nurse Communication          Time: [phone] OT Time Calculation (min): 49 min  Charges: OT General Charges $OT Visit: 1 Procedure OT Treatments $Self Care/Home Management : 38-52 mins   Malka So 12/31/2016, 9:45 AM  [phone]

## 2017-01-01 LAB — CBC
HCT: 31.8 % — ABNORMAL LOW (ref 36.0–46.0)
Hemoglobin: 9.5 g/dL — ABNORMAL LOW (ref 12.0–15.0)
MCH: 26.4 pg (ref 26.0–34.0)
MCHC: 29.9 g/dL — AB (ref 30.0–36.0)
MCV: 88.3 fL (ref 78.0–100.0)
Platelets: 303 10*3/uL (ref 150–400)
RBC: 3.6 MIL/uL — AB (ref 3.87–5.11)
RDW: 15.9 % — ABNORMAL HIGH (ref 11.5–15.5)
WBC: 6.7 10*3/uL (ref 4.0–10.5)

## 2017-01-01 LAB — GLUCOSE, CAPILLARY
GLUCOSE-CAPILLARY: 125 mg/dL — AB (ref 65–99)
GLUCOSE-CAPILLARY: 134 mg/dL — AB (ref 65–99)
Glucose-Capillary: 102 mg/dL — ABNORMAL HIGH (ref 65–99)
Glucose-Capillary: 81 mg/dL (ref 65–99)

## 2017-01-01 LAB — BASIC METABOLIC PANEL
Anion gap: 9 (ref 5–15)
BUN: 53 mg/dL — AB (ref 6–20)
CO2: 28 mmol/L (ref 22–32)
CREATININE: 1.39 mg/dL — AB (ref 0.44–1.00)
Calcium: 8.9 mg/dL (ref 8.9–10.3)
Chloride: 103 mmol/L (ref 101–111)
GFR calc Af Amer: 44 mL/min — ABNORMAL LOW (ref >60)
GFR calc non Af Amer: 38 mL/min — ABNORMAL LOW (ref >60)
Glucose, Bld: 102 mg/dL — ABNORMAL HIGH (ref 65–99)
POTASSIUM: 4.5 mmol/L (ref 3.5–5.1)
SODIUM: 140 mmol/L (ref 135–145)

## 2017-01-01 LAB — COOXEMETRY PANEL
CARBOXYHEMOGLOBIN: 1.4 % (ref 0.5–1.5)
Carboxyhemoglobin: 1.6 % — ABNORMAL HIGH (ref 0.5–1.5)
METHEMOGLOBIN: 1 % (ref 0.0–1.5)
Methemoglobin: 0.8 % (ref 0.0–1.5)
O2 Saturation: 50 %
O2 Saturation: 56.9 %
TOTAL HEMOGLOBIN: 9.9 g/dL — AB (ref 12.0–16.0)
Total hemoglobin: 12.2 g/dL (ref 12.0–16.0)

## 2017-01-01 MED ORDER — SODIUM CHLORIDE 0.9 % IV SOLN: 250.0000 mL | INTRAVENOUS | Status: DC | PRN

## 2017-01-01 MED ORDER — SODIUM CHLORIDE 0.9% FLUSH: 3.0000 mL | INTRAVENOUS | Status: DC | PRN

## 2017-01-01 MED ORDER — SODIUM CHLORIDE 0.9% FLUSH
3.0000 mL | Freq: Two times a day (BID) | INTRAVENOUS | Status: DC
  Administered 2017-01-01: 3 mL via INTRAVENOUS

## 2017-01-01 MED ORDER — MILRINONE LACTATE IN DEXTROSE 20-5 MG/100ML-% IV SOLN
0.1250 ug/kg/min | INTRAVENOUS | Status: DC
  Administered 2017-01-01: 0.125 ug/kg/min via INTRAVENOUS
  Filled 2017-01-01: qty 100

## 2017-01-01 MED ORDER — SODIUM CHLORIDE 0.9 % IV SOLN: INTRAVENOUS | Status: DC

## 2017-01-01 MED ORDER — FUROSEMIDE 10 MG/ML IJ SOLN
80.0000 mg | Freq: Two times a day (BID) | INTRAMUSCULAR | Status: DC
Start: 2017-01-01 — End: 2017-01-02
  Administered 2017-01-01: 80 mg via INTRAVENOUS
  Filled 2017-01-01 (×2): qty 8

## 2017-01-01 NOTE — Progress Notes (Signed)
Advanced Heart Failure Rounding Note   Subjective:    Co ox 50% today on 0.25 mcg milrinone.   Creatinine 1.22->1.39->1.32->1.25->1.39  Feels weak today, fatigued. No SOB or orthopnea. Tearful today.   Objective:     Vital Signs:   Temp:  [97.6 F (36.4 C)-97.8 F (36.6 C)] 97.8 F (36.6 C) (05/31 0541) Pulse Rate:  [70-76] 70 (05/31 0541) Resp:  [18-20] 19 (05/31 0541) BP: (112-137)/(50-65) 112/50 (05/31 0541) SpO2:  [95 %-97 %] 95 % (05/31 0541) Weight:  [178 lb 4.8 oz (80.9 kg)-178 lb 12.7 oz (81.1 kg)] 178 lb 4.8 oz (80.9 kg) (05/31 0541) Last BM Date: 12/31/16  Weight change: Filed Weights   12/31/16 0458 12/31/16 1041 01/01/17 0541  Weight: 181 lb 6.4 oz (82.3 kg) 178 lb 12.7 oz (81.1 kg) 178 lb 4.8 oz (80.9 kg)    Intake/Output:   Intake/Output Summary (Last 24 hours) at 01/01/17 0739 Last data filed at 01/01/17 0451  Gross per 24 hour  Intake              730 ml  Output             2450 ml  Net            -1720 ml    Physical Exam: General: Fatigued appearing female, lying in bed. NAD.   HEENT: Normal.  Neck: supple. JVP 8 Carotids 2+ bilat; no bruits. No lymphadenopathy or thryomegaly appreciated. Cor: PMI nondisplaced. Regular rate and rhythm. No rubs, gallops or murmurs. Lungs: Clear bilaterally. Normal effort.  Abdomen: obese soft, nontender, nondistended. No hepatosplenomegaly. No bruits or masses. Bowel sounds present in all quadrants.  Extremities: no cyanosis, clubbing, rash.Warm. Trace pedal edema. Neuro: alert & orientedx3, cranial nerves grossly intact. moves all 4 extremities w/o difficulty. Affect anxious  Telemetry: NSR 70s. Personally reviewed    Labs: Basic Metabolic Panel:  Recent Labs Lab 12/28/16 0500 12/29/16 0443 12/30/16 0831 12/31/16 0514 01/01/17 0447  NA 138 139 139 139 140  K 4.1 4.1 4.3 3.8 4.5  CL 105 105 105 104 103  CO2 28 26 25 28 28   GLUCOSE 88 100* 131* 104* 102*  BUN 38* 44* 46* 47* 53*  CREATININE  1.22* 1.39* 1.32* 1.25* 1.39*  CALCIUM 8.7* 9.0 9.0 8.8* 8.9    CBC:  Recent Labs Lab 12/28/16 0500 12/29/16 0443 12/30/16 0423 12/31/16 0514 01/01/17 0447  WBC 7.9 7.2 6.9 5.9 6.7  HGB 9.3* 9.5* 8.8* 9.0* 9.5*  HCT 31.0* 32.3* 29.3* 29.6* 31.8*  MCV 90.1 90.5 89.3 89.2 88.3  PLT 300 293 263 268 303    BNP: BNP (last 3 results)  Recent Labs  11/27/16 0159 12/17/16 1255  BNP 920.0* 1,062.6*   ProBNP (last 3 results) No results for input(s): PROBNP in the last 8760 hours.  Other results:  Imaging: No results found.   Medications:     Scheduled Medications: . amiodarone  200 mg Oral BID  . atorvastatin  40 mg Oral q1800  . Chlorhexidine Gluconate Cloth  6 each Topical Daily  . docusate sodium  200 mg Oral Daily  . feeding supplement (ENSURE ENLIVE)  237 mL Oral BID BM  . folic acid  1 mg Oral Daily  . furosemide  80 mg Intravenous TID  . glipiZIDE  5 mg Oral Q breakfast  . insulin aspart  0-15 Units Subcutaneous TID WC  . losartan  12.5 mg Oral QHS  . polyethylene glycol  17 g Oral Daily  .  potassium chloride  20 mEq Oral BID  . spironolactone  12.5 mg Oral Daily    Infusions: . milrinone 0.25 mcg/kg/min (01/01/17 0118)    PRN Medications: acetaminophen, ALPRAZolam, ALPRAZolam, alum & mag hydroxide-simeth, bisacodyl **OR** bisacodyl, magnesium hydroxide, miconazole, ondansetron (ZOFRAN) IV, oxyCODONE, sodium chloride flush, sodium phosphate, traMADol   Assessment/Plan/Discussion    1. Acute on chronic systolic HF -> Cardiogenic Shock - Co ox down to 50% today, will re draw to verify.   - Weight unchanged. Got a dose of metolazone yesterday.  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 No MRI with ICD.  - Will not add dig given concern over possible amyloid - can reconsider as needed.  - Continue losartan, increase to 25mg  hs.  - Continue spiro 12.5mg  daily.  - Creatinine bumped today, appears euvolemic. Will start po lasix today.   2.   Left pleural effusion - s/p Thoracentesis 12/22/16 with 1.2 L out.  - CXR 5/23 with mild improvement.  - Stable, no change to current plan.  3. 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  - No change to current plan.  3. PAF - Remains in NSR on po amio 200 mg BID for now.  - Off heparin due to hematuria. - No change to current plan.   4. Hematuria - CT ab negative.  - Received 1 UPRBC 12/21/16 with improvement of Hgb. (Total of 3 UPRBCs thus far) - Foley removed. No hematuria per patient. - Stable, no change to current plan.   5. AKI: - Creatinine 1.1 -> 1.4 -> 1.6. -> 1.55 -> 1.73 -> 1.48-> 1.2 -> 1.39->1.32->1.25->1.39 - Follow with daily BMET.  6. UTI: -  On bactrim 5/14 through 5/16. Switched to rocephin 5/16 . Now off. Urine CX + EColi  - Blood cultures NGTD - Denies dysuria 7. Deconditioning - Continue to mobilize.    - has worked with PT/OT, ok to return home with home health. 8. Symptomatic anemia - Improved s/p transfusion 12/21/16   - hgb continues to improve, 9.5 today.   Arbutus Leas, NP 01/01/2017   Patient seen and examined with [name], NP. We discussed all aspects of the encounter. I agree with the assessment and plan as stated above.   Continues to struggle with low output despite near normal EF and milrinone support. I think volume still up a bit.   I remained puzzled by her physiology. We are awaiting approval from Radiology for possible Technetium Pyrophosphate scan to evaluate for amyloid. SPEP negative.   I am going to turn milrinone down today to 0.135mcg/kg/min and plan RHC in the am to further sort out her hemodynamics.   Remains in NSR on amio. Refuses anticoagulation due to severe hematuria previously this admit.   Glori Bickers, MD  8:45 AM

## 2017-01-01 NOTE — Progress Notes (Signed)
[phone] Second offer from [place] to walk. Once at 0932 and now. Pt stated she just had laxative and she is still upset about possible cath tomorrow. Wants to see Dr Haroldine Laws. Gave her fresh cup of coffee and emotional support. Encouraged her to walk with staff later. Dr Haroldine Laws in to see pt. Graylon Good RN BSN 01/01/2017 11:33 AM

## 2017-01-01 NOTE — Progress Notes (Addendum)
      [address]       [employer] [id number]             [phone]      28 Days Post-Op Procedure(s) (LRB): CORONARY ARTERY BYPASS GRAFTING (CABG)x 4 WITH ENDOSCOPIC HARVESTING OF RIGHT SAPHENOUS VEIN (N/A) CLIPPING OF ATRIAL APPENDAGE USING ATRICURE 45 ATRICLIP (Left) INTRAOPERATIVE TRANSESOPHAGEAL ECHOCARDIOGRAM (N/A) MAZE PROCEDURE (N/A)   Subjective:  Patient is crying and upset this morning.  She is understandably frustrated with her current situation  Objective: Vital signs in last 24 hours: Temp:  [97.6 F (36.4 C)-97.8 F (36.6 C)] 97.8 F (36.6 C) (05/31 0541) Pulse Rate:  [70-76] 70 (05/31 0541) Cardiac Rhythm: Heart block (05/30 1900) Resp:  [18-20] 19 (05/31 0541) BP: (112-137)/(50-65) 112/50 (05/31 0541) SpO2:  [95 %-97 %] 95 % (05/31 0541) Weight:  [178 lb 4.8 oz (80.9 kg)-178 lb 12.7 oz (81.1 kg)] 178 lb 4.8 oz (80.9 kg) (05/31 0541)  Intake/Output from previous day: 05/30 0701 - 05/31 0700 In: 730 [P.O.:720; I.V.:10] Out: 2450 [Urine:2450] Intake/Output this shift: Total I/O In: 10 [I.V.:10] Out: -   General appearance: alert, cooperative and visibly upset Heart: regular rate and rhythm Lungs: clear to auscultation bilaterally Abdomen: soft, non-tender; bowel sounds normal; no masses,  no organomegaly Ext: mild edema Wound: clean and dry  Lab Results:  Recent Labs  12/31/16 0514 01/01/17 0447  WBC 5.9 6.7  HGB 9.0* 9.5*  HCT 29.6* 31.8*  PLT 268 303   BMET:  Recent Labs  12/31/16 0514 01/01/17 0447  NA 139 140  K 3.8 4.5  CL 104 103  CO2 28 28  GLUCOSE 104* 102*  BUN 47* 53*  CREATININE 1.25* 1.39*  CALCIUM 8.8* 8.9    PT/INR: No results for input(s): LABPROT, INR in the last 72 hours. ABG    Component Value Date/Time   PHART 7.369 12/04/2016 2343   HCO3 27.6 12/17/2016 1752   TCO2 29 12/17/2016 1752   ACIDBASEDEF 1.0 12/04/2016 2343   O2SAT 56.9 01/01/2017 0840   CBG (last 3)   Recent Labs  12/31/16 1640 12/31/16 2051 01/01/17 0607  GLUCAP 125* 123* 102*    Assessment/Plan: S/P Procedure(s) (LRB): CORONARY ARTERY BYPASS GRAFTING (CABG)x 4 WITH ENDOSCOPIC HARVESTING OF RIGHT SAPHENOUS VEIN (N/A) CLIPPING OF ATRIAL APPENDAGE USING ATRICURE 45 ATRICLIP (Left) INTRAOPERATIVE TRANSESOPHAGEAL ECHOCARDIOGRAM (N/A) MAZE PROCEDURE (N/A)  1. CV- Acute on Chronic Systolic--- Cardiogenic shock- advanced heart failure following, Co-ox has again decreased to 56.9%, per HF to decrease milrinone to 0.125... On Amiodarone, Cozaar 2. Pulm- no acute issues, denies shortness of breath 3. Renal- creatinine at 1.39 on Spironolactone, Lasix.. Weight is stable 4. Dispo- continue current care, AHF following  LOS: 35 days    BARRETT, ERIN 01/01/2017 Patient seen and examined, agree with above For right heart cath tomorrow  [name], MD [facility] [phone]

## 2017-01-01 NOTE — Progress Notes (Signed)
PT Cancellation Note  Patient Details Name: [name] MRN: [id number] DOB: 03/09/1946   Cancelled Treatment:    Reason Eval/Treat Not Completed: Patient declined, no reason specified (pt tearful and declining any mobility at this time. States she may attempt later. Pt aware of benefit and encouraged to mobilize)   [name] [name] 01/01/2017, 8:27 AM  [name], [place]

## 2017-01-02 ENCOUNTER — Encounter (HOSPITAL_COMMUNITY): Payer: Self-pay | Admitting: Internal Medicine

## 2017-01-02 ENCOUNTER — Encounter (HOSPITAL_COMMUNITY)
Admission: EM | Disposition: A | Discharge status: Home / Self Care | Payer: Self-pay | Source: Home / Self Care | Attending: Thoracic Surgery (Cardiothoracic Vascular Surgery)

## 2017-01-02 HISTORY — PX: RIGHT HEART CATH: CATH118263

## 2017-01-02 LAB — CBC
HCT: 30.6 % — ABNORMAL LOW (ref 36.0–46.0)
HEMATOCRIT: 32.8 % — AB (ref 36.0–46.0)
HEMOGLOBIN: 9.9 g/dL — AB (ref 12.0–15.0)
Hemoglobin: 9.3 g/dL — ABNORMAL LOW (ref 12.0–15.0)
MCH: 26.7 pg (ref 26.0–34.0)
MCH: 26.6 pg (ref 26.0–34.0)
MCHC: 30.4 g/dL (ref 30.0–36.0)
MCHC: 30.2 g/dL (ref 30.0–36.0)
MCV: 87.9 fL (ref 78.0–100.0)
MCV: 88.2 fL (ref 78.0–100.0)
Platelets: 291 10*3/uL (ref 150–400)
Platelets: 295 10*3/uL (ref 150–400)
RBC: 3.48 MIL/uL — ABNORMAL LOW (ref 3.87–5.11)
RBC: 3.72 MIL/uL — ABNORMAL LOW (ref 3.87–5.11)
RDW: 16.1 % — ABNORMAL HIGH (ref 11.5–15.5)
RDW: 16.1 % — ABNORMAL HIGH (ref 11.5–15.5)
WBC: 6.6 10*3/uL (ref 4.0–10.5)
WBC: 7.5 10*3/uL (ref 4.0–10.5)

## 2017-01-02 LAB — POCT I-STAT 3, VENOUS BLOOD GAS (G3P V)
ACID-BASE EXCESS: 3 mmol/L — AB (ref 0.0–2.0)
Acid-Base Excess: 4 mmol/L — ABNORMAL HIGH (ref 0.0–2.0)
BICARBONATE: 27.7 mmol/L (ref 20.0–28.0)
Bicarbonate: 28.9 mmol/L — ABNORMAL HIGH (ref 20.0–28.0)
O2 SAT: 63 %
O2 Saturation: 64 %
PCO2 VEN: 43.4 mmHg — AB (ref 44.0–60.0)
PH VEN: 7.43 (ref 7.250–7.430)
PH VEN: 7.431 — AB (ref 7.250–7.430)
PO2 VEN: 32 mmHg (ref 32.0–45.0)
PO2 VEN: 32 mmHg (ref 32.0–45.0)
TCO2: 29 mmol/L (ref 0–100)
TCO2: 30 mmol/L (ref 0–100)
pCO2, Ven: 41.7 mmHg — ABNORMAL LOW (ref 44.0–60.0)

## 2017-01-02 LAB — POCT I-STAT 3, ART BLOOD GAS (G3+)
Acid-Base Excess: 7 mmol/L — ABNORMAL HIGH (ref 0.0–2.0)
Bicarbonate: 30.5 mmol/L — ABNORMAL HIGH (ref 20.0–28.0)
O2 Saturation: 96 %
PCO2 ART: 38.1 mmHg (ref 32.0–48.0)
TCO2: 32 mmol/L (ref 0–100)
pH, Arterial: 7.511 — ABNORMAL HIGH (ref 7.350–7.450)
pO2, Arterial: 74 mmHg — ABNORMAL LOW (ref 83.0–108.0)

## 2017-01-02 LAB — COOXEMETRY PANEL
Carboxyhemoglobin: 1.3 % (ref 0.5–1.5)
Methemoglobin: 1.1 % (ref 0.0–1.5)
O2 SAT: 67.6 %
TOTAL HEMOGLOBIN: 9.5 g/dL — AB (ref 12.0–16.0)

## 2017-01-02 LAB — GLUCOSE, CAPILLARY
GLUCOSE-CAPILLARY: 81 mg/dL (ref 65–99)
GLUCOSE-CAPILLARY: 83 mg/dL (ref 65–99)
GLUCOSE-CAPILLARY: 102 mg/dL — AB (ref 65–99)
GLUCOSE-CAPILLARY: 105 mg/dL — AB (ref 65–99)
Glucose-Capillary: 92 mg/dL (ref 65–99)

## 2017-01-02 LAB — BASIC METABOLIC PANEL
Anion gap: 7 (ref 5–15)
BUN: 53 mg/dL — ABNORMAL HIGH (ref 6–20)
CALCIUM: 9 mg/dL (ref 8.9–10.3)
CHLORIDE: 104 mmol/L (ref 101–111)
CO2: 29 mmol/L (ref 22–32)
CREATININE: 1.41 mg/dL — AB (ref 0.44–1.00)
GFR, EST AFRICAN AMERICAN: 43 mL/min — AB (ref >60)
GFR, EST NON AFRICAN AMERICAN: 37 mL/min — AB (ref >60)
Glucose, Bld: 86 mg/dL (ref 65–99)
Potassium: 4.3 mmol/L (ref 3.5–5.1)
SODIUM: 140 mmol/L (ref 135–145)

## 2017-01-02 LAB — CREATININE, SERUM
CREATININE: 1.51 mg/dL — AB (ref 0.44–1.00)
GFR calc Af Amer: 40 mL/min — ABNORMAL LOW (ref >60)
GFR calc non Af Amer: 34 mL/min — ABNORMAL LOW (ref >60)

## 2017-01-02 LAB — PROTIME-INR
INR: 1.17
PROTHROMBIN TIME: 14.9 s (ref 11.4–15.2)

## 2017-01-02 SURGERY — RIGHT HEART CATH
Anesthesia: LOCAL

## 2017-01-02 MED ORDER — MIDAZOLAM HCL 2 MG/2ML IJ SOLN
INTRAMUSCULAR | Status: DC | PRN
  Administered 2017-01-02: 1 mg via INTRAVENOUS

## 2017-01-02 MED ORDER — LOSARTAN POTASSIUM 25 MG PO TABS
25.0000 mg | ORAL_TABLET | Freq: Every day | ORAL | Status: DC
  Administered 2017-01-02: 25 mg via ORAL
  Filled 2017-01-02: qty 1

## 2017-01-02 MED ORDER — ONDANSETRON HCL 4 MG/2ML IJ SOLN
4.0000 mg | Freq: Four times a day (QID) | INTRAMUSCULAR | Status: DC | PRN
  Administered 2017-01-03 – 2017-01-05 (×4): 4 mg via INTRAVENOUS
  Filled 2017-01-02 (×4): qty 2

## 2017-01-02 MED ORDER — SODIUM CHLORIDE 0.9% FLUSH: 3.0000 mL | Freq: Two times a day (BID) | INTRAVENOUS | Status: DC

## 2017-01-02 MED ORDER — HEPARIN (PORCINE) IN NACL 2-0.9 UNIT/ML-% IJ SOLN
INTRAMUSCULAR | Status: AC
  Filled 2017-01-02: qty 500

## 2017-01-02 MED ORDER — LIDOCAINE HCL (PF) 1 % IJ SOLN
INTRAMUSCULAR | Status: DC | PRN
  Administered 2017-01-02: 15 mL

## 2017-01-02 MED ORDER — FENTANYL CITRATE (PF) 100 MCG/2ML IJ SOLN
INTRAMUSCULAR | Status: DC | PRN
  Administered 2017-01-02: 25 ug via INTRAVENOUS

## 2017-01-02 MED ORDER — ACETAMINOPHEN 325 MG PO TABS
650.0000 mg | ORAL_TABLET | ORAL | Status: DC | PRN
  Administered 2017-01-07 – 2017-01-08 (×3): 650 mg via ORAL
  Filled 2017-01-02 (×3): qty 2

## 2017-01-02 MED ORDER — FENTANYL CITRATE (PF) 100 MCG/2ML IJ SOLN
INTRAMUSCULAR | Status: AC
  Filled 2017-01-02: qty 2

## 2017-01-02 MED ORDER — LIDOCAINE HCL 1 % IJ SOLN
INTRAMUSCULAR | Status: AC
  Filled 2017-01-02: qty 20

## 2017-01-02 MED ORDER — HEPARIN (PORCINE) IN NACL 2-0.9 UNIT/ML-% IJ SOLN
INTRAMUSCULAR | Status: AC | PRN
  Administered 2017-01-02: 500 mL

## 2017-01-02 MED ORDER — ENOXAPARIN SODIUM 40 MG/0.4ML ~~LOC~~ SOLN
40.0000 mg | SUBCUTANEOUS | Status: DC
  Administered 2017-01-03: 40 mg via SUBCUTANEOUS
  Filled 2017-01-02 (×2): qty 0.4

## 2017-01-02 MED ORDER — SODIUM CHLORIDE 0.9% FLUSH: 3.0000 mL | INTRAVENOUS | Status: DC | PRN

## 2017-01-02 MED ORDER — MIDAZOLAM HCL 2 MG/2ML IJ SOLN
INTRAMUSCULAR | Status: AC
  Filled 2017-01-02: qty 2

## 2017-01-02 MED ORDER — ONDANSETRON HCL 4 MG/2ML IJ SOLN
INTRAMUSCULAR | Status: AC
  Filled 2017-01-02: qty 2

## 2017-01-02 MED ORDER — SODIUM CHLORIDE 0.9 % IV SOLN
250.0000 mL | INTRAVENOUS | Status: DC | PRN
  Administered 2017-01-02 – 2017-01-04 (×3): 250 mL via INTRAVENOUS

## 2017-01-02 MED ORDER — FUROSEMIDE 80 MG PO TABS
80.0000 mg | ORAL_TABLET | Freq: Every day | ORAL | Status: DC
  Administered 2017-01-02 – 2017-01-03 (×2): 80 mg via ORAL
  Filled 2017-01-02 (×2): qty 1

## 2017-01-02 MED ORDER — SILDENAFIL CITRATE 20 MG PO TABS
20.0000 mg | ORAL_TABLET | Freq: Three times a day (TID) | ORAL | Status: DC
  Administered 2017-01-02 (×2): 20 mg via ORAL
  Filled 2017-01-02 (×7): qty 1

## 2017-01-02 SURGICAL SUPPLY — 5 items
CATH SWAN GANZ 7F STRAIGHT (CATHETERS) ×1 IMPLANT
KIT HEART LEFT (KITS) ×2 IMPLANT
PACK CARDIAC CATHETERIZATION (CUSTOM PROCEDURE TRAY) ×2 IMPLANT
SHEATH PINNACLE 7F 10CM (SHEATH) ×1 IMPLANT
TRANSDUCER W/STOPCOCK (MISCELLANEOUS) ×2 IMPLANT

## 2017-01-02 NOTE — Progress Notes (Signed)
[address]       [employer] [id number]             [phone]      29 Days Post-Op Procedure(s) (LRB): CORONARY ARTERY BYPASS GRAFTING (CABG)x 4 WITH ENDOSCOPIC HARVESTING OF RIGHT SAPHENOUS VEIN (N/A) CLIPPING OF ATRIAL APPENDAGE USING ATRICURE 45 ATRICLIP (Left) INTRAOPERATIVE TRANSESOPHAGEAL ECHOCARDIOGRAM (N/A) MAZE PROCEDURE (N/A) Subjective: Feels ok, slept last night, heading to cath lab for right heart cath  Objective: Vital signs in last 24 hours: Temp:  [97.6 F (36.4 C)-98.1 F (36.7 C)] 98.1 F (36.7 C) (06/01 0415) Pulse Rate:  [68-73] 68 (06/01 0415) Cardiac Rhythm: Normal sinus rhythm;Heart block;Bundle branch block (06/01 0700) Resp:  [18-19] 18 (06/01 0415) BP: (114-139)/(58-65) 114/65 (06/01 0415) SpO2:  [96 %-99 %] 96 % (06/01 0415) Weight:  [174 lb 1.6 oz (79 kg)] 174 lb 1.6 oz (79 kg) (06/01 0415)  Hemodynamic parameters for last 24 hours:    Intake/Output from previous day: 05/31 0701 - 06/01 0700 In: 270 [P.O.:240; I.V.:30] Out: 3200 [Urine:3200] Intake/Output this shift: No intake/output data recorded.  General appearance: alert, cooperative and no distress Heart: regular rate and rhythm Lungs: clear to auscultation bilaterally Abdomen: benign Extremities: trace edema Wound: incis healing well  Lab Results:  Recent Labs  01/01/17 0447 01/02/17 0412  WBC 6.7 6.6  HGB 9.5* 9.3*  HCT 31.8* 30.6*  PLT 303 291   BMET:  Recent Labs  01/01/17 0447 01/02/17 0412  NA 140 140  K 4.5 4.3  CL 103 104  CO2 28 29  GLUCOSE 102* 86  BUN 53* 53*  CREATININE 1.39* 1.41*  CALCIUM 8.9 9.0    PT/INR:  Recent Labs  01/02/17 0412  LABPROT 14.9  INR 1.17   ABG    Component Value Date/Time   PHART 7.369 12/04/2016 2343   HCO3 27.6 12/17/2016 1752   TCO2 29 12/17/2016 1752   ACIDBASEDEF 1.0 12/04/2016 2343   O2SAT 67.6 01/02/2017 0425   CBG (last 3)   Recent Labs  01/01/17 1600 01/01/17 2055  01/02/17 0609  GLUCAP 81 134* 81    Meds Scheduled Meds: . amiodarone  200 mg Oral BID  . atorvastatin  40 mg Oral q1800  . Chlorhexidine Gluconate Cloth  6 each Topical Daily  . docusate sodium  200 mg Oral Daily  . feeding supplement (ENSURE ENLIVE)  237 mL Oral BID BM  . folic acid  1 mg Oral Daily  . furosemide  80 mg Intravenous BID  . glipiZIDE  5 mg Oral Q breakfast  . insulin aspart  0-15 Units Subcutaneous TID WC  . losartan  12.5 mg Oral QHS  . polyethylene glycol  17 g Oral Daily  . potassium chloride  20 mEq Oral BID  . sodium chloride flush  3 mL Intravenous Q12H  . spironolactone  12.5 mg Oral Daily   Continuous Infusions: . sodium chloride    . sodium chloride 10 mL/hr at 01/02/17 0550  . milrinone 0.125 mcg/kg/min (01/01/17 2149)   PRN Meds:.sodium chloride, acetaminophen, ALPRAZolam, ALPRAZolam, alum & mag hydroxide-simeth, bisacodyl **OR** bisacodyl, magnesium hydroxide, miconazole, ondansetron (ZOFRAN) IV, oxyCODONE, sodium chloride flush, sodium chloride flush, sodium phosphate, traMADol  Xrays No results found.  Assessment/Plan: S/P Procedure(s) (LRB): CORONARY ARTERY BYPASS GRAFTING (CABG)x 4 WITH ENDOSCOPIC HARVESTING OF RIGHT SAPHENOUS VEIN (N/A) CLIPPING OF ATRIAL APPENDAGE USING ATRICURE 45 ATRICLIP (Left) INTRAOPERATIVE TRANSESOPHAGEAL ECHOCARDIOGRAM (N/A) MAZE PROCEDURE (N/A)  1 doing well 2  right heart cath today 3 Co-OX 67, creat 1.41- edema is much improved 4 sugars well controlled 5 H/H stable   LOS: 36 days    [name] 01/02/2017

## 2017-01-02 NOTE — H&P (View-Only) (Signed)
Advanced Heart Failure Rounding Note   Subjective:    Co ox 50% today on 0.25 mcg milrinone.   Creatinine 1.22->1.39->1.32->1.25->1.39  Feels weak today, fatigued. No SOB or orthopnea. Tearful today.   Objective:     Vital Signs:   Temp:  [97.6 F (36.4 C)-97.8 F (36.6 C)] 97.8 F (36.6 C) (05/31 0541) Pulse Rate:  [70-76] 70 (05/31 0541) Resp:  [18-20] 19 (05/31 0541) BP: (112-137)/(50-65) 112/50 (05/31 0541) SpO2:  [95 %-97 %] 95 % (05/31 0541) Weight:  [178 lb 4.8 oz (80.9 kg)-178 lb 12.7 oz (81.1 kg)] 178 lb 4.8 oz (80.9 kg) (05/31 0541) Last BM Date: 12/31/16  Weight change: Filed Weights   12/31/16 0458 12/31/16 1041 01/01/17 0541  Weight: 181 lb 6.4 oz (82.3 kg) 178 lb 12.7 oz (81.1 kg) 178 lb 4.8 oz (80.9 kg)    Intake/Output:   Intake/Output Summary (Last 24 hours) at 01/01/17 0739 Last data filed at 01/01/17 0451  Gross per 24 hour  Intake              730 ml  Output             2450 ml  Net            -1720 ml    Physical Exam: General: Fatigued appearing female, lying in bed. NAD.   HEENT: Normal.  Neck: supple. JVP 8 Carotids 2+ bilat; no bruits. No lymphadenopathy or thryomegaly appreciated. Cor: PMI nondisplaced. Regular rate and rhythm. No rubs, gallops or murmurs. Lungs: Clear bilaterally. Normal effort.  Abdomen: obese soft, nontender, nondistended. No hepatosplenomegaly. No bruits or masses. Bowel sounds present in all quadrants.  Extremities: no cyanosis, clubbing, rash.Warm. Trace pedal edema. Neuro: alert & orientedx3, cranial nerves grossly intact. moves all 4 extremities w/o difficulty. Affect anxious  Telemetry: NSR 70s. Personally reviewed    Labs: Basic Metabolic Panel:  Recent Labs Lab 12/28/16 0500 12/29/16 0443 12/30/16 0831 12/31/16 0514 01/01/17 0447  NA 138 139 139 139 140  K 4.1 4.1 4.3 3.8 4.5  CL 105 105 105 104 103  CO2 28 26 25 28 28   GLUCOSE 88 100* 131* 104* 102*  BUN 38* 44* 46* 47* 53*  CREATININE  1.22* 1.39* 1.32* 1.25* 1.39*  CALCIUM 8.7* 9.0 9.0 8.8* 8.9    CBC:  Recent Labs Lab 12/28/16 0500 12/29/16 0443 12/30/16 0423 12/31/16 0514 01/01/17 0447  WBC 7.9 7.2 6.9 5.9 6.7  HGB 9.3* 9.5* 8.8* 9.0* 9.5*  HCT 31.0* 32.3* 29.3* 29.6* 31.8*  MCV 90.1 90.5 89.3 89.2 88.3  PLT 300 293 263 268 303    BNP: BNP (last 3 results)  Recent Labs  11/27/16 0159 12/17/16 1255  BNP 920.0* 1,062.6*   ProBNP (last 3 results) No results for input(s): PROBNP in the last 8760 hours.  Other results:  Imaging: No results found.   Medications:     Scheduled Medications: . amiodarone  200 mg Oral BID  . atorvastatin  40 mg Oral q1800  . Chlorhexidine Gluconate Cloth  6 each Topical Daily  . docusate sodium  200 mg Oral Daily  . feeding supplement (ENSURE ENLIVE)  237 mL Oral BID BM  . folic acid  1 mg Oral Daily  . furosemide  80 mg Intravenous TID  . glipiZIDE  5 mg Oral Q breakfast  . insulin aspart  0-15 Units Subcutaneous TID WC  . losartan  12.5 mg Oral QHS  . polyethylene glycol  17 g Oral Daily  .  potassium chloride  20 mEq Oral BID  . spironolactone  12.5 mg Oral Daily    Infusions: . milrinone 0.25 mcg/kg/min (01/01/17 0118)    PRN Medications: acetaminophen, ALPRAZolam, ALPRAZolam, alum & mag hydroxide-simeth, bisacodyl **OR** bisacodyl, magnesium hydroxide, miconazole, ondansetron (ZOFRAN) IV, oxyCODONE, sodium chloride flush, sodium phosphate, traMADol   Assessment/Plan/Discussion    1. Acute on chronic systolic HF -> Cardiogenic Shock - Co ox down to 50% today, will re draw to verify.   - Weight unchanged. Got a dose of metolazone yesterday.  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 No MRI with ICD.  - Will not add dig given concern over possible amyloid - can reconsider as needed.  - Continue losartan, increase to 25mg  hs.  - Continue spiro 12.5mg  daily.  - Creatinine bumped today, appears euvolemic. Will start po lasix today.   2.   Left pleural effusion - s/p Thoracentesis 12/22/16 with 1.2 L out.  - CXR 5/23 with mild improvement.  - Stable, no change to current plan.  3. 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  - No change to current plan.  3. PAF - Remains in NSR on po amio 200 mg BID for now.  - Off heparin due to hematuria. - No change to current plan.   4. Hematuria - CT ab negative.  - Received 1 UPRBC 12/21/16 with improvement of Hgb. (Total of 3 UPRBCs thus far) - Foley removed. No hematuria per patient. - Stable, no change to current plan.   5. AKI: - Creatinine 1.1 -> 1.4 -> 1.6. -> 1.55 -> 1.73 -> 1.48-> 1.2 -> 1.39->1.32->1.25->1.39 - Follow with daily BMET.  6. UTI: -  On bactrim 5/14 through 5/16. Switched to rocephin 5/16 . Now off. Urine CX + EColi  - Blood cultures NGTD - Denies dysuria 7. Deconditioning - Continue to mobilize.    - has worked with PT/OT, ok to return home with home health. 8. Symptomatic anemia - Improved s/p transfusion 12/21/16   - hgb continues to improve, 9.5 today.   Arbutus Leas, NP 01/01/2017   Patient seen and examined with [name], NP. We discussed all aspects of the encounter. I agree with the assessment and plan as stated above.   Continues to struggle with low output despite near normal EF and milrinone support. I think volume still up a bit.   I remained puzzled by her physiology. We are awaiting approval from Radiology for possible Technetium Pyrophosphate scan to evaluate for amyloid. SPEP negative.   I am going to turn milrinone down today to 0.182mcg/kg/min and plan RHC in the am to further sort out her hemodynamics.   Remains in NSR on amio. Refuses anticoagulation due to severe hematuria previously this admit.   Glori Bickers, MD  8:45 AM

## 2017-01-02 NOTE — Hospital Discharge Follow-Up (Signed)
Met with the patient this afternoon. She is very anxious to go home. An appointment with the [facility] has been scheduled for 01/12/17 @ 0945 and the information has been placed on the AVS. She stated that her grandson will be able to drive her to the clinic.   Cephus Shelling, RN CM notified that the patient will be followed by the [facility].

## 2017-01-02 NOTE — Progress Notes (Signed)
Patient's blood sugar is 81. Is NPO for cath procedure at 0900. Gave orange juice and will continue to monitor.

## 2017-01-02 NOTE — Progress Notes (Signed)
PT Cancellation Note  Patient Details Name: [name] MRN: [id number] DOB: [birth year]   Cancelled Treatment:    Reason Eval/Treat Not Completed: Patient at procedure or test/unavailable (pt OOR for cath)   [name] [name] 01/02/2017, 8:08 AM  [name], [place]

## 2017-01-02 NOTE — Progress Notes (Signed)
Site area: Right groin a 7 french venous sheath was removed  Site Prior to Removal:  Level 0  Pressure Applied For 10 MINUTES    Bedrest Beginning at 0930am  Manual:   Yes.    Patient Status During Pull:  stable  Post Pull Groin Site:  Level 0  Post Pull Instructions Given:  Yes.    Post Pull Pulses Present:  Yes.    Dressing Applied:  Yes.    Comments:  VS remain stable during sheath pull

## 2017-01-02 NOTE — Interval H&P Note (Signed)
History and Physical Interval Note:  01/02/2017 8:26 AM  [name]  has presented today for surgery, with the diagnosis of chf  The various methods of treatment have been discussed with the patient and family. After consideration of risks, benefits and other options for treatment, the patient has consented to  Procedure(s): Right Heart Cath (N/A) as a surgical intervention .  The patient's history has been reviewed, patient examined, no change in status, stable for surgery.  I have reviewed the patient's chart and labs.  Questions were answered to the patient's satisfaction.     [name], Quillian Quince

## 2017-01-02 NOTE — Progress Notes (Signed)
Advanced Heart Failure Rounding Note   Subjective:    Co ox 68% on 0.125 mcg milrinone. Weight down 4 pounds from yesterday.   Creatinine 1.22->1.39->1.32->1.25->1.39->1.41  Feels ok this morning, she is happy that she got some rest last night. Denies SOB and orthopnea.   Objective:     Vital Signs:   Temp:  [97.6 F (36.4 C)-98.1 F (36.7 C)] 98.1 F (36.7 C) (06/01 0415) Pulse Rate:  [68-73] 68 (06/01 0415) Resp:  [18-19] 18 (06/01 0415) BP: (114-139)/(58-65) 114/65 (06/01 0415) SpO2:  [96 %-99 %] 96 % (06/01 0415) Weight:  [174 lb 1.6 oz (79 kg)] 174 lb 1.6 oz (79 kg) (06/01 0415) Last BM Date: 01/01/17  Weight change: Filed Weights   12/31/16 1041 01/01/17 0541 01/02/17 0415  Weight: 178 lb 12.7 oz (81.1 kg) 178 lb 4.8 oz (80.9 kg) 174 lb 1.6 oz (79 kg)    Intake/Output:   Intake/Output Summary (Last 24 hours) at 01/02/17 0749 Last data filed at 01/02/17 0425  Gross per 24 hour  Intake              270 ml  Output             3200 ml  Net            -2930 ml    Physical Exam: General: Fatigued appearing female. NAD. Lying in bed.  HEENT: Normal.  Neck: supple. 6-7 cm JVP.  Carotids 2+ bilat; no bruits. No lymphadenopathy or thryomegaly appreciated. Cor: PMI nondisplaced. Regular rate and rhythm. No rubs, gallops or murmurs. Lungs: Clear bilaterally. Normal effort.  Abdomen: obese soft, nontender, nondistended. No hepatosplenomegaly. No bruits or masses. + Bowel sounds  Extremities: no cyanosis, clubbing, rash. Warm. Trace pretibial edema.  Neuro: alert & orientedx3, cranial nerves grossly intact. moves all 4 extremities w/o difficulty. Affect pleasant.   Telemetry: NSR 70-80's.     Labs: Basic Metabolic Panel:  Recent Labs Lab 12/29/16 0443 12/30/16 0831 12/31/16 0514 01/01/17 0447 01/02/17 0412  NA 139 139 139 140 140  K 4.1 4.3 3.8 4.5 4.3  CL 105 105 104 103 104  CO2 26 25 28 28 29   GLUCOSE 100* 131* 104* 102* 86  BUN 44* 46* 47* 53* 53*   CREATININE 1.39* 1.32* 1.25* 1.39* 1.41*  CALCIUM 9.0 9.0 8.8* 8.9 9.0    CBC:  Recent Labs Lab 12/29/16 0443 12/30/16 0423 12/31/16 0514 01/01/17 0447 01/02/17 0412  WBC 7.2 6.9 5.9 6.7 6.6  HGB 9.5* 8.8* 9.0* 9.5* 9.3*  HCT 32.3* 29.3* 29.6* 31.8* 30.6*  MCV 90.5 89.3 89.2 88.3 87.9  PLT 293 263 268 303 291    BNP: BNP (last 3 results)  Recent Labs  11/27/16 0159 12/17/16 1255  BNP 920.0* 1,062.6*     Medications:     Scheduled Medications: . amiodarone  200 mg Oral BID  . atorvastatin  40 mg Oral q1800  . Chlorhexidine Gluconate Cloth  6 each Topical Daily  . docusate sodium  200 mg Oral Daily  . feeding supplement (ENSURE ENLIVE)  237 mL Oral BID BM  . folic acid  1 mg Oral Daily  . furosemide  80 mg Intravenous BID  . glipiZIDE  5 mg Oral Q breakfast  . insulin aspart  0-15 Units Subcutaneous TID WC  . losartan  12.5 mg Oral QHS  . polyethylene glycol  17 g Oral Daily  . potassium chloride  20 mEq Oral BID  . sodium chloride flush  3 mL Intravenous Q12H  . spironolactone  12.5 mg Oral Daily    Infusions: . sodium chloride    . sodium chloride 10 mL/hr at 01/02/17 0550  . milrinone 0.125 mcg/kg/min (01/01/17 2149)    PRN Medications: sodium chloride, acetaminophen, ALPRAZolam, ALPRAZolam, alum & mag hydroxide-simeth, bisacodyl **OR** bisacodyl, magnesium hydroxide, miconazole, ondansetron (ZOFRAN) IV, oxyCODONE, sodium chloride flush, sodium chloride flush, sodium phosphate, traMADol   Assessment/Plan/Discussion    1. Acute on chronic systolic HF -> Cardiogenic Shock - Co ox improved today - 68%.  - Weight down today, volume status stable on exam  - Will decide about diuretics once RHC is completed.   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 No MRI with ICD.  - Will not add dig given concern over possible amyloid - can reconsider as needed.  - Increase to losartan 25 mg hs.  - Continue spiro 12.5mg  daily.  2.  Left pleural  effusion - s/p Thoracentesis 12/22/16 with 1.2 L out.  - CXR 5/23 with mild improvement.  - No change to current plan.   3. CAD S/P CABG L Atrial Appendage Clipp Maze 12/04/2016 - Continue statin. Last ASA dose was 5/18, it was discontinued due to hematuria.  - Chest pain free.  3. PAF - Remains in NSR on po amio 200 mg BID for now.  - Off heparin due to hematuria. - No change to current plan.  4. Hematuria - CT ab negative.  - Received 1 UPRBC 12/21/16 with improvement of Hgb. (Total of 3 UPRBCs thus far) - Foley removed. No hematuria per patient. - Stable, no re occurance.  5. AKI: - Creatinine slightly increased today. Likely reaching euvolemia, will see with RHC.  6. UTI: -  On bactrim 5/14 through 5/16. Switched to rocephin 5/16 . Now off. Urine CX + EColi  - Blood cultures NGTD - Denies dysuria  7. Deconditioning - Continue to mobilize.    - has worked with PT/OT, ok to return home with home health. 8. Symptomatic anemia - Improved s/p transfusion 12/21/16   - Hgb stable.   Arbutus Leas, NP 01/02/2017   Patient seen and examined with [name], NP. We discussed all aspects of the encounter. I agree with the assessment and plan as stated above.   She is improving slowly. Remains on milrinone 0.125. Volume status improved. Co-ox stable. Remains in NSR. Continue amio. For [place] today.   Glori Bickers, MD  8:59 AM

## 2017-01-02 NOTE — Progress Notes (Signed)
Day of Surgery Procedure(s) (LRB): Right Heart Cath (N/A) Subjective: Feels better today, much more optimistic after cath  Objective: Vital signs in last 24 hours: Temp:  [97.6 F (36.4 C)-98.1 F (36.7 C)] 98.1 F (36.7 C) (06/01 0415) Pulse Rate:  [0-96] 66 (06/01 0925) Cardiac Rhythm: Normal sinus rhythm;Heart block;Bundle branch block (06/01 0700) Resp:  [0-43] 22 (06/01 0925) BP: (104-139)/(50-68) 119/57 (06/01 0920) SpO2:  [0 %-99 %] 93 % (06/01 0925) Weight:  [174 lb 1.6 oz (79 kg)] 174 lb 1.6 oz (79 kg) (06/01 0415)  Hemodynamic parameters for last 24 hours:    Intake/Output from previous day: 05/31 0701 - 06/01 0700 In: 270 [P.O.:240; I.V.:30] Out: 3200 [Urine:3200] Intake/Output this shift: No intake/output data recorded.  General appearance: alert, cooperative and no distress Heart: regular rate and rhythm Extremities: edema 2+ Wound: clean and dry  Lab Results:  Recent Labs  01/01/17 0447 01/02/17 0412  WBC 6.7 6.6  HGB 9.5* 9.3*  HCT 31.8* 30.6*  PLT 303 291   BMET:  Recent Labs  01/01/17 0447 01/02/17 0412  NA 140 140  K 4.5 4.3  CL 103 104  CO2 28 29  GLUCOSE 102* 86  BUN 53* 53*  CREATININE 1.39* 1.41*  CALCIUM 8.9 9.0    PT/INR:  Recent Labs  01/02/17 0412  LABPROT 14.9  INR 1.17   ABG    Component Value Date/Time   PHART 7.369 12/04/2016 2343   HCO3 27.6 12/17/2016 1752   TCO2 29 12/17/2016 1752   ACIDBASEDEF 1.0 12/04/2016 2343   O2SAT 67.6 01/02/2017 0425   CBG (last 3)   Recent Labs  01/02/17 0609 01/02/17 0915 01/02/17 1132  GLUCAP 81 92 83    Assessment/Plan: S/P Procedure(s) (LRB): Right Heart Cath (N/A) -results of right heart cath noted- good index but PA, right heart pressures elevated Agree with plan to try sildenafil and wean milrinone Change lasix to PO Hopefully home in 2-3 days   LOS: 36 days    Melrose Nakayama 01/02/2017

## 2017-01-02 NOTE — Progress Notes (Signed)
CARDIAC REHAB PHASE I   PRE:  Rate/Rhythm: 70 SR  BP:  Sitting: 111/59        SaO2: 92 RA  MODE:  Ambulation: 350 ft   POST:  Rate/Rhythm: 112 ST  BP:  Sitting: 113/56         SaO2: 95 RA  Pt sitting on edge of bed, smiling today, states she feels much better today than yesterday. Pt ambulated 350 ft on RA, IV, rolling walker, assist x1, mostly steady gait, tolerated well. Pt denies any other complaints other than her R knee"giving out." Pt did lose balance twice during walk due to her knee. Pt very high risk for falling at home, working with PT. Pt to edge of bed per pt request after walk, call bell within reach. Will follow.   [place], RN, BSN 01/02/2017 3:00 PM

## 2017-01-03 LAB — CBC
HCT: 30 % — ABNORMAL LOW (ref 36.0–46.0)
Hemoglobin: 8.8 g/dL — ABNORMAL LOW (ref 12.0–15.0)
MCH: 26.1 pg (ref 26.0–34.0)
MCHC: 29.3 g/dL — AB (ref 30.0–36.0)
MCV: 89 fL (ref 78.0–100.0)
PLATELETS: 270 10*3/uL (ref 150–400)
RBC: 3.37 MIL/uL — ABNORMAL LOW (ref 3.87–5.11)
RDW: 16.4 % — ABNORMAL HIGH (ref 11.5–15.5)
WBC: 7.4 10*3/uL (ref 4.0–10.5)

## 2017-01-03 LAB — BASIC METABOLIC PANEL
Anion gap: 10 (ref 5–15)
BUN: 54 mg/dL — ABNORMAL HIGH (ref 6–20)
CO2: 24 mmol/L (ref 22–32)
CREATININE: 1.79 mg/dL — AB (ref 0.44–1.00)
Calcium: 8.3 mg/dL — ABNORMAL LOW (ref 8.9–10.3)
Chloride: 100 mmol/L — ABNORMAL LOW (ref 101–111)
GFR, EST AFRICAN AMERICAN: 32 mL/min — AB (ref >60)
GFR, EST NON AFRICAN AMERICAN: 28 mL/min — AB (ref >60)
Glucose, Bld: 95 mg/dL (ref 65–99)
Potassium: 5.2 mmol/L — ABNORMAL HIGH (ref 3.5–5.1)
SODIUM: 134 mmol/L — AB (ref 135–145)

## 2017-01-03 LAB — GLUCOSE, CAPILLARY
GLUCOSE-CAPILLARY: 149 mg/dL — AB (ref 65–99)
Glucose-Capillary: 114 mg/dL — ABNORMAL HIGH (ref 65–99)
Glucose-Capillary: 149 mg/dL — ABNORMAL HIGH (ref 65–99)
Glucose-Capillary: 197 mg/dL — ABNORMAL HIGH (ref 65–99)

## 2017-01-03 LAB — COOXEMETRY PANEL
CARBOXYHEMOGLOBIN: 1.7 % — AB (ref 0.5–1.5)
Carboxyhemoglobin: 1.7 % — ABNORMAL HIGH (ref 0.5–1.5)
METHEMOGLOBIN: 0.9 % (ref 0.0–1.5)
Methemoglobin: 0.9 % (ref 0.0–1.5)
O2 SAT: 51.1 %
O2 Saturation: 46.3 %
TOTAL HEMOGLOBIN: 12 g/dL (ref 12.0–16.0)
TOTAL HEMOGLOBIN: 10 g/dL — AB (ref 12.0–16.0)

## 2017-01-03 LAB — POTASSIUM: Potassium: 5.2 mmol/L — ABNORMAL HIGH (ref 3.5–5.1)

## 2017-01-03 MED ORDER — MILRINONE LACTATE IN DEXTROSE 20-5 MG/100ML-% IV SOLN
0.3750 ug/kg/min | INTRAVENOUS | Status: DC
  Administered 2017-01-03: 0.25 ug/kg/min via INTRAVENOUS
  Administered 2017-01-04 – 2017-01-08 (×9): 0.375 ug/kg/min via INTRAVENOUS
  Filled 2017-01-03 (×11): qty 100

## 2017-01-03 MED ORDER — ENOXAPARIN SODIUM 30 MG/0.3ML ~~LOC~~ SOLN
30.0000 mg | SUBCUTANEOUS | Status: DC
  Administered 2017-01-04 – 2017-01-05 (×2): 30 mg via SUBCUTANEOUS
  Filled 2017-01-03 (×2): qty 0.3

## 2017-01-03 MED ORDER — ENOXAPARIN SODIUM 30 MG/0.3ML ~~LOC~~ SOLN: 30.0000 mg | SUBCUTANEOUS | Status: DC

## 2017-01-03 MED ORDER — AMIODARONE IV BOLUS ONLY 150 MG/100ML
150.0000 mg | Freq: Once | INTRAVENOUS | Status: AC
  Administered 2017-01-03: 150 mg via INTRAVENOUS
  Filled 2017-01-03: qty 100

## 2017-01-03 MED ORDER — SODIUM CHLORIDE 0.9 % IV BOLUS (SEPSIS)
250.0000 mL | Freq: Once | INTRAVENOUS | Status: AC
  Administered 2017-01-03: 250 mL via INTRAVENOUS

## 2017-01-03 MED ORDER — AMIODARONE HCL IN DEXTROSE 360-4.14 MG/200ML-% IV SOLN
60.0000 mg/h | INTRAVENOUS | Status: AC
  Administered 2017-01-03: 60 mg/h via INTRAVENOUS
  Filled 2017-01-03: qty 200

## 2017-01-03 MED ORDER — AMIODARONE HCL IN DEXTROSE 360-4.14 MG/200ML-% IV SOLN
30.0000 mg/h | INTRAVENOUS | Status: DC
  Administered 2017-01-03 – 2017-01-06 (×7): 30 mg/h via INTRAVENOUS
  Filled 2017-01-03 (×7): qty 200

## 2017-01-03 NOTE — Progress Notes (Signed)
Advanced Heart Failure Rounding Note   Subjective:    RHC yesterday. As below. Milrinone stopped an sildenafil started.   Hypotensive overnight. Sildenafil stopped. This am nauseated and weak. Back in AF 60-70s. Creatinine up to 1.8  Done on milrinone 0.169mcg/kg/min  RA = 10  RV = 40/15 PA = 42/15 (28) PCW = 16 Fick cardiac output/index = 6.1/3.3 Thermo CO/CI = 5.0/2.7 PVR = 2.4 FA sat = 96% PA sat = 63%. 64%   Objective:     Vital Signs:   Temp:  [97.8 F (36.6 C)-98.7 F (37.1 C)] 98.7 F (37.1 C) (06/02 0415) Pulse Rate:  [65-73] 65 (06/02 0415) Resp:  [18-19] 18 (06/02 0415) BP: (82-113)/(37-62) 82/37 (06/02 0415) SpO2:  [90 %-92 %] 90 % (06/02 0415) Weight:  [80 kg (176 lb 4.8 oz)] 80 kg (176 lb 4.8 oz) (06/02 0415) Last BM Date: 01/01/17  Weight change: Filed Weights   01/01/17 0541 01/02/17 0415 01/03/17 0415  Weight: 80.9 kg (178 lb 4.8 oz) 79 kg (174 lb 1.6 oz) 80 kg (176 lb 4.8 oz)    Intake/Output:   Intake/Output Summary (Last 24 hours) at 01/03/17 1218 Last data filed at 01/03/17 1214  Gross per 24 hour  Intake              250 ml  Output              900 ml  Net             -650 ml    Physical Exam: General:  Lying in bed weak. Pale nauseated HEENT: normal anicteric Neck: supple. JVP 7-8. Carotids 2+ bilat; no bruits. No lymphadenopathy or thryomegaly appreciated. Cor: PMI nondisplaced. IRR. IRR. No rubs, gallops or murmurs. Lungs: clear Abdomen: soft, nontender, mildly distended. No hepatosplenomegaly. No bruits or masses. Good bowel sounds. Extremities: no cyanosis, clubbing, rash, edema  cool Neuro: alert & orientedx3, cranial nerves grossly intact. moves all 4 extremities w/o difficulty. Affect tearful    Telemetry: AF 60-70s. Personally reviewed     Labs: Basic Metabolic Panel:  Recent Labs Lab 12/30/16 0831 12/31/16 0514 01/01/17 0447 01/02/17 0412 01/02/17 1407 01/03/17 0501  NA 139 139 140 140  --  134*  K  4.3 3.8 4.5 4.3  --  5.2*  CL 105 104 103 104  --  100*  CO2 25 28 28 29   --  24  GLUCOSE 131* 104* 102* 86  --  95  BUN 46* 47* 53* 53*  --  54*  CREATININE 1.32* 1.25* 1.39* 1.41* 1.51* 1.79*  CALCIUM 9.0 8.8* 8.9 9.0  --  8.3*    CBC:  Recent Labs Lab 12/31/16 0514 01/01/17 0447 01/02/17 0412 01/02/17 1407 01/03/17 0501  WBC 5.9 6.7 6.6 7.5 7.4  HGB 9.0* 9.5* 9.3* 9.9* 8.8*  HCT 29.6* 31.8* 30.6* 32.8* 30.0*  MCV 89.2 88.3 87.9 88.2 89.0  PLT 268 303 291 295 270    BNP: BNP (last 3 results)  Recent Labs  11/27/16 0159 12/17/16 1255  BNP 920.0* 1,062.6*     Medications:     Scheduled Medications: . atorvastatin  40 mg Oral q1800  . Chlorhexidine Gluconate Cloth  6 each Topical Daily  . docusate sodium  200 mg Oral Daily  . [START ON 01/04/2017] enoxaparin (LOVENOX) injection  30 mg Subcutaneous Q24H  . feeding supplement (ENSURE ENLIVE)  237 mL Oral BID BM  . folic acid  1 mg Oral Daily  . furosemide  80 mg Oral Daily  . glipiZIDE  5 mg Oral Q breakfast  . insulin aspart  0-15 Units Subcutaneous TID WC  . losartan  25 mg Oral QHS  . polyethylene glycol  17 g Oral Daily  . potassium chloride  20 mEq Oral BID  . sildenafil  20 mg Oral TID  . sodium chloride flush  3 mL Intravenous Q12H  . spironolactone  12.5 mg Oral Daily    Infusions: . sodium chloride 250 mL (01/02/17 2111)  . amiodarone    . amiodarone    . amiodarone    . milrinone      PRN Medications: sodium chloride, acetaminophen, ALPRAZolam, ALPRAZolam, alum & mag hydroxide-simeth, bisacodyl **OR** bisacodyl, magnesium hydroxide, miconazole, ondansetron (ZOFRAN) IV, oxyCODONE, sodium chloride flush, sodium chloride flush, sodium phosphate, traMADol   Assessment/Plan/Discussion    1. Acute on chronic systolic HF -> Cardiogenic Shock - RHC 6/1 with mild RV failure otherwise well compensated. - Milrinone stopped overnight and now back in shock - Will restart milrinone. Will need home  milrinone - AF likely complicating picture. 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 No MRI with ICD. Will f/u with Radiology regarding TPY study.  - Will not add dig given concern over possible amyloid - can reconsider as needed.  - Stop losartan and spiro.  2.  Left pleural effusion - s/p Thoracentesis 12/22/16 with 1.2 L out.  - CXR 5/23 with mild improvement. Can repeat as needed   3. 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  Continue statin. Last ASA dose was 5/18, it was discontinued due to hematuria.  - Chest pain free.  3. PAF - Back in AF today. Likely worsening hemodynamics  - Off heparin due to hematuria. - Restart IV amio  4. Hematuria - CT ab negative.  - Received 1 UPRBC 12/21/16 with improvement of Hgb. (Total of 3 UPRBCs thus far) - Foley removed. No hematuria per patient. - Stable, no re occurance.  - tolerating enox for DVT prophylaxis 5. AKI: - Creatinine up likely due to low output. Resume milrinone 6. UTI: -  On bactrim 5/14 through 5/16. Switched to rocephin 5/16 . Now off. Urine CX + EColi  - Blood cultures NGTD - Denies dysuria  7. Symptomatic anemia - acute blood loss due to hematuria - Improved s/p transfusion 12/21/16   - Hgb stable.   Glori Bickers, MD 01/03/2017

## 2017-01-03 NOTE — Progress Notes (Signed)
Patient restarted on milrinone during dayshift. Order for CVP was also ordered. Notified on call cardiology to clarify CVP order [name]). No clarification could be given at this time on CVP monitoring. Will continue to monitor and clarify CVP order in AM during rounding.

## 2017-01-03 NOTE — Progress Notes (Signed)
Heart Failure MD called and said to monitor pressures and for day shift nurse to hold Sildenalfil until Dr. Haroldine Laws makes morning rounds. Will follow orders and pass to day shift RN

## 2017-01-03 NOTE — Progress Notes (Addendum)
      [address]       [place] [id number]             [phone]      1 Day Post-Op Procedure(s) (LRB): Right Heart Cath (N/A) Subjective: Shares that she had a bad night was dizzy and lightheaded. She threw up but feels better after some zofran and an ensure. She is overall feeling a little better  Objective: Vital signs in last 24 hours: Temp:  [97.8 F (36.6 C)-98.7 F (37.1 C)] 98.7 F (37.1 C) (06/02 0415) Pulse Rate:  [64-96] 65 (06/02 0415) Cardiac Rhythm: A-V Sequential paced (06/02 0700) Resp:  [10-22] 18 (06/02 0415) BP: (82-131)/(37-68) 82/37 (06/02 0415) SpO2:  [90 %-98 %] 90 % (06/02 0415) Weight:  [80 kg (176 lb 4.8 oz)] 80 kg (176 lb 4.8 oz) (06/02 0415)     Intake/Output from previous day: 06/01 0701 - 06/02 0700 In: 240 [P.O.:240] Out: 900 [Urine:900] Intake/Output this shift: No intake/output data recorded.  General appearance: alert, cooperative and no distress Heart: regular rate and rhythm, S1, S2 normal, no murmur, click, rub or gallop Lungs: clear to auscultation bilaterally Abdomen: soft, non-tender; bowel sounds normal; no masses,  no organomegaly Extremities: extremities normal, atraumatic, no cyanosis or edema Wound: clean and dry  Lab Results:  Recent Labs  01/02/17 1407 01/03/17 0501  WBC 7.5 7.4  HGB 9.9* 8.8*  HCT 32.8* 30.0*  PLT 295 270   BMET:  Recent Labs  01/02/17 0412 01/02/17 1407 01/03/17 0501  NA 140  --  134*  K 4.3  --  5.2*  CL 104  --  100*  CO2 29  --  24  GLUCOSE 86  --  95  BUN 53*  --  54*  CREATININE 1.41* 1.51* 1.79*  CALCIUM 9.0  --  8.3*    PT/INR:  Recent Labs  01/02/17 0412  LABPROT 14.9  INR 1.17   ABG    Component Value Date/Time   PHART 7.511 (H) 01/02/2017 0836   HCO3 28.9 (H) 01/02/2017 0843   HCO3 27.7 01/02/2017 0843   TCO2 30 01/02/2017 0843   TCO2 29 01/02/2017 0843   ACIDBASEDEF 1.0 12/04/2016 2343   O2SAT 51.1 01/03/2017 0515   CBG (last 3)    Recent Labs  01/02/17 1643 01/02/17 2059 01/03/17 0605  GLUCAP 102* 105* 114*    Assessment/Plan: S/P Procedure(s) (LRB): Right Heart Cath (N/A)  1. Overall doing okay 2. Right heart cath reviewed, milrinone discontinued. Sidenafil on hold until heart failure evaluates. BP dropped to 80 systolic overnight, now 544 systolic. HR in the 60s NSR 3. Continue oral lasix, holding potassium replacement due to hyperkalemia. Creatinine is 1.79 this morning.  4. Nausea/vomiting-resolved with zofran. The patient still doesn't feel like eating breakfast, however tolerated an ensure. Encouraged to hydrate as she can.   Plan: Will continue to monitor closely today. Repeat potassium lab this afternoon.    LOS: 37 days    Elgie Collard 01/03/2017   I have seen and examined the patient and agree with the assessment and plan as outlined.  Feels a little better now back on milrinone.  Depressed.  Denies SOB.  Nauseated w/ activity.  Rate-controlled Afib w/ stable BP.  Agree w/ plans per Dr Haroldine Laws  Rexene Alberts, MD 01/03/2017 2:08 PM

## 2017-01-03 NOTE — Progress Notes (Signed)
Called TCTS MD on call to notify him of pt's  BP of 82/37 and 91/47. Pt c/o of dizziness and being lightheaded. MD on call doesn't want to make any medication changes at this time. MD on call stated that heart failure team should be made aware as well. Heart Failure team has  Been paged, will update them and follow any orders given. Will await call back of heart failure team.

## 2017-01-03 NOTE — Progress Notes (Signed)
1103 Pt too nauseated to sit on side of bed. Will hold walk at this time. Gave cool cloth. Will follow up later as time permits. Graylon Good RN BSN 01/03/2017 11:05 AM

## 2017-01-03 NOTE — Progress Notes (Signed)
1300 Pt sleeping with cloth over eyes. Will let her rest and staff can walk later if pt feels up to it. Will continue to follow. Graylon Good RN BSN 01/03/2017 12:59 PM

## 2017-01-03 NOTE — Progress Notes (Signed)
Heart Failure on call paged again,will await call back.

## 2017-01-04 LAB — BASIC METABOLIC PANEL
ANION GAP: 8 (ref 5–15)
BUN: 62 mg/dL — ABNORMAL HIGH (ref 6–20)
CHLORIDE: 102 mmol/L (ref 101–111)
CO2: 25 mmol/L (ref 22–32)
Calcium: 8.5 mg/dL — ABNORMAL LOW (ref 8.9–10.3)
Creatinine, Ser: 1.72 mg/dL — ABNORMAL HIGH (ref 0.44–1.00)
GFR calc Af Amer: 34 mL/min — ABNORMAL LOW (ref >60)
GFR calc non Af Amer: 29 mL/min — ABNORMAL LOW (ref >60)
Glucose, Bld: 128 mg/dL — ABNORMAL HIGH (ref 65–99)
POTASSIUM: 4.3 mmol/L (ref 3.5–5.1)
SODIUM: 135 mmol/L (ref 135–145)

## 2017-01-04 LAB — GLUCOSE, CAPILLARY
GLUCOSE-CAPILLARY: 148 mg/dL — AB (ref 65–99)
GLUCOSE-CAPILLARY: 163 mg/dL — AB (ref 65–99)
Glucose-Capillary: 147 mg/dL — ABNORMAL HIGH (ref 65–99)
Glucose-Capillary: 128 mg/dL — ABNORMAL HIGH (ref 65–99)

## 2017-01-04 LAB — CBC
HCT: 28.8 % — ABNORMAL LOW (ref 36.0–46.0)
HEMOGLOBIN: 8.7 g/dL — AB (ref 12.0–15.0)
MCH: 26.6 pg (ref 26.0–34.0)
MCHC: 30.2 g/dL (ref 30.0–36.0)
MCV: 88.1 fL (ref 78.0–100.0)
Platelets: 251 10*3/uL (ref 150–400)
RBC: 3.27 MIL/uL — AB (ref 3.87–5.11)
RDW: 16.1 % — ABNORMAL HIGH (ref 11.5–15.5)
WBC: 8.5 10*3/uL (ref 4.0–10.5)

## 2017-01-04 LAB — COOXEMETRY PANEL
Carboxyhemoglobin: 1.5 % (ref 0.5–1.5)
METHEMOGLOBIN: 1.3 % (ref 0.0–1.5)
O2 Saturation: 57.3 %
Total hemoglobin: 9.1 g/dL — ABNORMAL LOW (ref 12.0–16.0)

## 2017-01-04 NOTE — Progress Notes (Signed)
Advanced Heart Failure Rounding Note   Subjective:    RHC on 6/1 as below showed relatively well compensated hemodynamics with mild RV failure on milrinone 0.125. Milrinone stopped and sildenafil started.   Yesterday became hpotensive and nauseated. Co-ox in 48s. Sildenafil stopped and started back on milrinone. Now at 0.375. Co-ox 57%  Still weak. But feeling better. Remains in AF. Rates in 80s. Nausea improving but not resolved. No CP/SOB    RHC (6/1) Done on milrinone 0.138mcg/kg/min  RA = 10  RV = 40/15 PA = 42/15 (28) PCW = 16 Fick cardiac output/index = 6.1/3.3 Thermo CO/CI = 5.0/2.7 PVR = 2.4 FA sat = 96% PA sat = 63%. 64%   Objective:     Vital Signs:   Temp:  [97.7 F (36.5 C)-98 F (36.7 C)] 97.7 F (36.5 C) (06/03 0517) Pulse Rate:  [76-83] 83 (06/03 0517) Resp:  [17] 17 (06/03 0517) BP: (94-106)/(52-64) 102/52 (06/03 0530) SpO2:  [95 %-97 %] 95 % (06/03 0517) Weight:  [178 lb 3.2 oz (80.8 kg)] 178 lb 3.2 oz (80.8 kg) (06/03 0300) Last BM Date: 01/03/17  Weight change: Filed Weights   01/02/17 0415 01/03/17 0415 01/04/17 0300  Weight: 174 lb 1.6 oz (79 kg) 176 lb 4.8 oz (80 kg) 178 lb 3.2 oz (80.8 kg)    Intake/Output:   Intake/Output Summary (Last 24 hours) at 01/04/17 1057 Last data filed at 01/04/17 0449  Gross per 24 hour  Intake           703.67 ml  Output              550 ml  Net           153.67 ml    Physical Exam: General:  Sitting on bedside commode. Pale and weak  HEENT: normal Neck: supple. JVP 7. Carotids 2+ bilat; no bruits. No lymphadenopathy or thryomegaly appreciated. Cor: PMI nondisplaced. IRR No rubs, gallops or murmurs. Lungs: clear Abdomen: soft, nontender, nondistended. No hepatosplenomegaly. No bruits or masses. Good bowel sounds. Extremities: no cyanosis, clubbing, rash, pale trace edema Neuro: alert & orientedx3, cranial nerves grossly intact. moves all 4 extremities w/o difficulty. Affect  pleasant   Telemetry: AF 70-80s. Personally reviewed      Labs: Basic Metabolic Panel:  Recent Labs Lab 12/31/16 0514 01/01/17 0447 01/02/17 0412 01/02/17 1407 01/03/17 0501 01/03/17 1203 01/04/17 0425  NA 139 140 140  --  134*  --  135  K 3.8 4.5 4.3  --  5.2* 5.2* 4.3  CL 104 103 104  --  100*  --  102  CO2 28 28 29   --  24  --  25  GLUCOSE 104* 102* 86  --  95  --  128*  BUN 47* 53* 53*  --  54*  --  62*  CREATININE 1.25* 1.39* 1.41* 1.51* 1.79*  --  1.72*  CALCIUM 8.8* 8.9 9.0  --  8.3*  --  8.5*    CBC:  Recent Labs Lab 01/01/17 0447 01/02/17 0412 01/02/17 1407 01/03/17 0501 01/04/17 0425  WBC 6.7 6.6 7.5 7.4 8.5  HGB 9.5* 9.3* 9.9* 8.8* 8.7*  HCT 31.8* 30.6* 32.8* 30.0* 28.8*  MCV 88.3 87.9 88.2 89.0 88.1  PLT 303 291 295 270 251    BNP: BNP (last 3 results)  Recent Labs  11/27/16 0159 12/17/16 1255  BNP 920.0* 1,062.6*     Medications:     Scheduled Medications: . atorvastatin  40 mg Oral  q1800  . Chlorhexidine Gluconate Cloth  6 each Topical Daily  . docusate sodium  200 mg Oral Daily  . enoxaparin (LOVENOX) injection  30 mg Subcutaneous Q24H  . feeding supplement (ENSURE ENLIVE)  237 mL Oral BID BM  . folic acid  1 mg Oral Daily  . glipiZIDE  5 mg Oral Q breakfast  . insulin aspart  0-15 Units Subcutaneous TID WC  . polyethylene glycol  17 g Oral Daily  . sodium chloride flush  3 mL Intravenous Q12H    Infusions: . sodium chloride 250 mL (01/03/17 2018)  . amiodarone 30 mg/hr (01/04/17 0411)  . milrinone 0.375 mcg/kg/min (01/04/17 0032)    PRN Medications: sodium chloride, acetaminophen, ALPRAZolam, ALPRAZolam, alum & mag hydroxide-simeth, bisacodyl **OR** bisacodyl, magnesium hydroxide, miconazole, ondansetron (ZOFRAN) IV, oxyCODONE, sodium chloride flush, sodium chloride flush, sodium phosphate, traMADol   Assessment/Plan/Discussion    1. Acute on chronic systolic HF -> Cardiogenic Shock - RHC 6/1 with mild RV failure  otherwise well compensated. - Milrinone stopped on 6/1 and developed recurrent shock  - Now back on milrinone and co-ox improving but still marginal. Will need home milrinone - AF likely complicating picture. Now back on amio. 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 No MRI with ICD. Will f/u with Radiology regarding TPY study.  - Will not add dig given concern over possible amyloid - can reconsider as needed.  - Off losartan and spiro.  2.  Left pleural effusion - s/p Thoracentesis 12/22/16 with 1.2 L out.  - CXR 5/23 with mild improvement. Will repeat in am  3. 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  Continue statin. Last ASA dose was 5/18, it was discontinued due to hematuria.  - Chest pain free.  4. PAF - Back in AF as of 6/2. Likely worsening hemodynamics. Amio restarted. Rate controlled - Off heparin due to hematuria. 4. Hematuria - CT ab negative.  - Received 1 UPRBC 12/21/16 with improvement of Hgb. (Total of 3 UPRBCs thus far) - Foley removed. No hematuria per patient. - Stable, no re occurance.  - tolerating enox for DVT prophylaxis 5. AKI: - Creatinine up likely due to low output. Resume milrinone 6. UTI: -  On bactrim 5/14 through 5/16. Switched to rocephin 5/16 . Now off. Urine CX + EColi  - Blood cultures NGTD - Denies dysuria  7. Symptomatic anemia - acute blood loss due to hematuria - Improved s/p transfusion 12/21/16   - Hgb stable at 8.7 today  Glori Bickers, MD 01/04/2017

## 2017-01-04 NOTE — Progress Notes (Addendum)
      [address]       [place] [address]             [phone]      2 Days Post-Op Procedure(s) (LRB): Right Heart Cath (N/A) Subjective: Remains nauseated. Feels weak.   Objective: Vital signs in last 24 hours: Temp:  [97.7 F (36.5 C)-98 F (36.7 C)] 97.7 F (36.5 C) (06/03 0517) Pulse Rate:  [76-83] 83 (06/03 0517) Cardiac Rhythm: Atrial fibrillation;Other (Comment) (06/03 0700) Resp:  [17] 17 (06/03 0517) BP: (94-106)/(52-64) 102/52 (06/03 0530) SpO2:  [95 %-97 %] 95 % (06/03 0517) Weight:  [80.8 kg (178 lb 3.2 oz)] 80.8 kg (178 lb 3.2 oz) (06/03 0300)     Intake/Output from previous day: 06/02 0701 - 06/03 0700 In: 703.7 [P.O.:120; I.V.:583.7] Out: 550 [Urine:550] Intake/Output this shift: No intake/output data recorded.  General appearance: alert, cooperative and no distress Heart: irregularly irregular Lungs: clear to auscultation bilaterally Abdomen: soft, non-tender; bowel sounds normal; no masses,  no organomegaly Extremities: extremities normal, atraumatic, no cyanosis or edema Wound: clean and dry  Lab Results:  Recent Labs  01/03/17 0501 01/04/17 0425  WBC 7.4 8.5  HGB 8.8* 8.7*  HCT 30.0* 28.8*  PLT 270 251   BMET:  Recent Labs  01/03/17 0501 01/03/17 1203 01/04/17 0425  NA 134*  --  135  K 5.2* 5.2* 4.3  CL 100*  --  102  CO2 24  --  25  GLUCOSE 95  --  128*  BUN 54*  --  62*  CREATININE 1.79*  --  1.72*  CALCIUM 8.3*  --  8.5*    PT/INR:  Recent Labs  01/02/17 0412  LABPROT 14.9  INR 1.17   ABG    Component Value Date/Time   PHART 7.511 (H) 01/02/2017 0836   HCO3 28.9 (H) 01/02/2017 0843   HCO3 27.7 01/02/2017 0843   TCO2 30 01/02/2017 0843   TCO2 29 01/02/2017 0843   ACIDBASEDEF 1.0 12/04/2016 2343   O2SAT 57.3 01/04/2017 0440   CBG (last 3)   Recent Labs  01/03/17 1633 01/03/17 2021 01/04/17 0515  GLUCAP 197* 149* 148*    Assessment/Plan: S/P Procedure(s) (LRB): Right Heart Cath  (N/A)  1. CV- rate controlled atrial fibrillation, rate 80s. BP with slight improvement. Patient on IV Amio and IV Milrinone. Coox 57.3% 2. Pulm-tolerating room air. No recent CXR 3.  Renal-creatinine trending down.  Hyperkalemia improved. Lasix and spironolactone discontinued 4.H and H stable 5. Nausea/vomiting-remains on zofran PRN. Able to drink ensure but not much else. Might consider adding phenergan PRN.   Plan: Continue ambulation as able. Use incentive spirometer as able. Medication plan per heart failure.    LOS: 38 days    Elgie Collard 01/04/2017  I have seen and examined the patient and agree with the assessment and plan as outlined.  Rexene Alberts, MD 01/04/2017 9:55 AM

## 2017-01-05 ENCOUNTER — Ambulatory Visit: Payer: Self-pay

## 2017-01-05 LAB — GLUCOSE, CAPILLARY
Glucose-Capillary: 116 mg/dL — ABNORMAL HIGH (ref 65–99)
Glucose-Capillary: 142 mg/dL — ABNORMAL HIGH (ref 65–99)
Glucose-Capillary: 125 mg/dL — ABNORMAL HIGH (ref 65–99)
Glucose-Capillary: 206 mg/dL — ABNORMAL HIGH (ref 65–99)

## 2017-01-05 LAB — CBC
HEMATOCRIT: 28.2 % — AB (ref 36.0–46.0)
Hemoglobin: 8.5 g/dL — ABNORMAL LOW (ref 12.0–15.0)
MCH: 26.4 pg (ref 26.0–34.0)
MCHC: 30.1 g/dL (ref 30.0–36.0)
MCV: 87.6 fL (ref 78.0–100.0)
Platelets: 243 10*3/uL (ref 150–400)
RBC: 3.22 MIL/uL — ABNORMAL LOW (ref 3.87–5.11)
RDW: 15.8 % — AB (ref 11.5–15.5)
WBC: 6.7 10*3/uL (ref 4.0–10.5)

## 2017-01-05 LAB — COOXEMETRY PANEL
Carboxyhemoglobin: 1.1 % (ref 0.5–1.5)
Methemoglobin: 1 % (ref 0.0–1.5)
O2 SAT: 56.6 %
Total hemoglobin: 13.1 g/dL (ref 12.0–16.0)

## 2017-01-05 LAB — BASIC METABOLIC PANEL
Anion gap: 6 (ref 5–15)
BUN: 52 mg/dL — AB (ref 6–20)
CALCIUM: 8.7 mg/dL — AB (ref 8.9–10.3)
CO2: 27 mmol/L (ref 22–32)
Chloride: 102 mmol/L (ref 101–111)
Creatinine, Ser: 1.28 mg/dL — ABNORMAL HIGH (ref 0.44–1.00)
GFR calc non Af Amer: 42 mL/min — ABNORMAL LOW (ref >60)
GFR, EST AFRICAN AMERICAN: 48 mL/min — AB (ref >60)
GLUCOSE: 130 mg/dL — AB (ref 65–99)
Potassium: 3.9 mmol/L (ref 3.5–5.1)
Sodium: 135 mmol/L (ref 135–145)

## 2017-01-05 LAB — MAGNESIUM: Magnesium: 2.5 mg/dL — ABNORMAL HIGH (ref 1.7–2.4)

## 2017-01-05 MED ORDER — APIXABAN 5 MG PO TABS
5.0000 mg | ORAL_TABLET | Freq: Two times a day (BID) | ORAL | Status: DC
  Administered 2017-01-05 – 2017-01-08 (×7): 5 mg via ORAL
  Filled 2017-01-05 (×7): qty 1

## 2017-01-05 MED ORDER — POTASSIUM CHLORIDE CRYS ER 20 MEQ PO TBCR
20.0000 meq | EXTENDED_RELEASE_TABLET | Freq: Once | ORAL | Status: AC
  Administered 2017-01-05: 20 meq via ORAL
  Filled 2017-01-05: qty 1

## 2017-01-05 MED ORDER — FUROSEMIDE 10 MG/ML IJ SOLN
40.0000 mg | Freq: Once | INTRAMUSCULAR | Status: AC
  Administered 2017-01-05: 40 mg via INTRAVENOUS
  Filled 2017-01-05: qty 4

## 2017-01-05 NOTE — Progress Notes (Signed)
1500 Offered to walk with pt. Declined at this time. Tearful and not feeling well. Trying to eat and nauseated. Encouraged her to walk with staff later. Graylon Good RN BSN 01/05/2017 3:01 PM

## 2017-01-05 NOTE — Progress Notes (Addendum)
      [address]       [place] [address]             [phone]      3 Days Post-Op Procedure(s) (LRB): Right Heart Cath (N/A) Subjective: Feels upset over continued need for hospitalization. Felt SOB lying down last evening. In afib with controlled HR   Objective: Vital signs in last 24 hours: Temp:  [97.7 F (36.5 C)-98.1 F (36.7 C)] 98.1 F (36.7 C) (06/04 0409) Pulse Rate:  [83-86] 86 (06/04 0409) Cardiac Rhythm: Atrial fibrillation (06/04 0700) Resp:  [18] 18 (06/04 0409) BP: (110-133)/(60-64) 110/60 (06/04 0409) SpO2:  [87 %-94 %] 87 % (06/04 0409) Weight:  [177 lb 11.2 oz (80.6 kg)] 177 lb 11.2 oz (80.6 kg) (06/04 0409)  Hemodynamic parameters for last 24 hours:    Intake/Output from previous day: 06/03 0701 - 06/04 0700 In: 936.2 [P.O.:600; I.V.:336.2] Out: 1300 [Urine:1300] Intake/Output this shift: No intake/output data recorded.  General appearance: alert, cooperative and no distress Heart: irregularly irregular rhythm Lungs: dim in left base Abdomen: benign Extremities: minimal edema Wound: incis healing well  Lab Results:  Recent Labs  01/04/17 0425 01/05/17 0455  WBC 8.5 6.7  HGB 8.7* 8.5*  HCT 28.8* 28.2*  PLT 251 243   BMET:  Recent Labs  01/04/17 0425 01/05/17 0455  NA 135 135  K 4.3 3.9  CL 102 102  CO2 25 27  GLUCOSE 128* 130*  BUN 62* 52*  CREATININE 1.72* 1.28*  CALCIUM 8.5* 8.7*    PT/INR: No results for input(s): LABPROT, INR in the last 72 hours. ABG    Component Value Date/Time   PHART 7.511 (H) 01/02/2017 0836   HCO3 28.9 (H) 01/02/2017 0843   HCO3 27.7 01/02/2017 0843   TCO2 30 01/02/2017 0843   TCO2 29 01/02/2017 0843   ACIDBASEDEF 1.0 12/04/2016 2343   O2SAT 56.6 01/05/2017 0500   CBG (last 3)   Recent Labs  01/04/17 1625 01/04/17 2110 01/05/17 0541  GLUCAP 147* 128* 116*    Meds Scheduled Meds: . atorvastatin  40 mg Oral q1800  . Chlorhexidine Gluconate Cloth  6 each  Topical Daily  . docusate sodium  200 mg Oral Daily  . enoxaparin (LOVENOX) injection  30 mg Subcutaneous Q24H  . feeding supplement (ENSURE ENLIVE)  237 mL Oral BID BM  . folic acid  1 mg Oral Daily  . glipiZIDE  5 mg Oral Q breakfast  . insulin aspart  0-15 Units Subcutaneous TID WC  . polyethylene glycol  17 g Oral Daily  . sodium chloride flush  3 mL Intravenous Q12H   Continuous Infusions: . sodium chloride 250 mL (01/04/17 2030)  . amiodarone 30 mg/hr (01/05/17 0540)  . milrinone 0.375 mcg/kg/min (01/04/17 2318)   PRN Meds:.sodium chloride, acetaminophen, ALPRAZolam, ALPRAZolam, alum & mag hydroxide-simeth, bisacodyl **OR** bisacodyl, magnesium hydroxide, miconazole, ondansetron (ZOFRAN) IV, oxyCODONE, sodium chloride flush, sodium chloride flush, sodium phosphate, traMADol  Xrays No results found.  Assessment/Plan: S/P Procedure(s) (LRB): Right Heart Cath (N/A)   1 conts to struggle with heart failure, RV, acute on chronicsystolic.  AHF team managing .  2 Creat is improved 3 H/H is stable 4 push rehab, pulm toilet/ nutrition as able  LOS: 39 days    [name] 01/05/2017 Patient seen and examined, agree with above No surgical issues Plan per AHF team  Kayla Lipps C. Roxan Hockey, MD [facility] [phone]

## 2017-01-05 NOTE — Progress Notes (Signed)
Per [employer] check for [employer] # 7. S/W MARI @ [facility] # [phone] OPT-2   1. ELIQUIS 2.5 MG BID  COVER- YES  CO-PAY- $ 45.00  TIER- 3 DRUG  PRIOR APPROVAL- NO   2. ELIQUIS 5 MG BID  COVER- YES  CO-PAY- $ 45.00  TIER- 3 DRUG  PRIOR APPROVAL- NO   PHARMACY : [facility] [place]

## 2017-01-05 NOTE — Progress Notes (Signed)
Physical Therapy Treatment Patient Details Name: [name] MRN: [id number] DOB: 1946-01-20 Today's Date: 01/05/2017    History of Present Illness 71 yo s/p NSTEMI with CABG 5/3. PMhx: CAD, CHF, VT, orthostatic hypotension, ICD.  Transferred to ICU on due to cardiogenic shock on 5/16 with lower HGBs.  Pt also noticed to have clots in her urine, so heparin stopped and CT of abdomen ordered which did not show any acute processes in the abdomen, but did show bil pleural effusions.      PT Comments    Pt with flat affect and reports terrible weekend due to nausea and that she did not walk in the hall at all. Pt reports she ate limited breakfast and agreeable to gait. She continues to rely on RW and not willing to attempt gait without yet. Pt educated for gait, HEP and progression. Will continue to follow. Pt continues to state she plans return home with daughter.  HR 94-104 SpO2 96% on RA BP 98/69 pre gait (74) Post 116/59 (77)   Follow Up Recommendations  Home health PT;Supervision/Assistance - 24 hour     Equipment Recommendations  Rolling walker with 5" wheels;3in1 (PT)    Recommendations for Other Services       Precautions / Restrictions Precautions Precautions: Fall;Sternal    Mobility  Bed Mobility               General bed mobility comments: EOB on arrival   Transfers Overall transfer level: Modified independent               General transfer comment: placed hands on knees without cues   Ambulation/Gait Ambulation/Gait assistance: Min guard Ambulation Distance (Feet): 300 Feet Assistive device: Rolling walker (2 wheeled) Gait Pattern/deviations: Step-through pattern;Decreased stride length   Gait velocity interpretation: Below normal speed for age/gender General Gait Details: cues for posture and position in RW, Pt reported dizziness with turning, standing rest grossly 20 sec with cues for gaze fixation with resolution of symptoms, no  nystagmus   Stairs            Wheelchair Mobility    Modified Rankin (Stroke Patients Only)       Balance Overall balance assessment: Needs assistance   Sitting balance-Leahy Scale: Good       Standing balance-Leahy Scale: Fair                              Cognition Arousal/Alertness: Awake/alert Behavior During Therapy: Flat affect Overall Cognitive Status: Within Functional Limits for tasks assessed                                        Exercises General Exercises - Lower Extremity Long Arc Quad: AROM;20 reps;Both;Seated Hip Flexion/Marching: AROM;20 reps;Both;Seated Toe Raises: AROM;20 reps;Both;Seated Heel Raises: AROM;20 reps;Both;Seated    General Comments        Pertinent Vitals/Pain Pain Assessment: No/denies pain    Home Living                      Prior Function            PT Goals (current goals can now be found in the care plan section) Progress towards PT goals: Progressing toward goals    Frequency           PT Plan Current  plan remains appropriate    Co-evaluation              AM-PAC PT "6 Clicks" Daily Activity  Outcome Measure  Difficulty turning over in bed (including adjusting bedclothes, sheets and blankets)?: A Little Difficulty moving from lying on back to sitting on the side of the bed? : A Little Difficulty sitting down on and standing up from a chair with arms (e.g., wheelchair, bedside commode, etc,.)?: None Help needed moving to and from a bed to chair (including a wheelchair)?: None Help needed walking in hospital room?: A Little Help needed climbing 3-5 steps with a railing? : A Little 6 Click Score: 20    End of Session Equipment Utilized During Treatment: Gait belt Activity Tolerance: Patient tolerated treatment well Patient left: in bed;with call bell/phone within reach Nurse Communication: Mobility status       Time: 3846-6599 PT Time Calculation (min)  (ACUTE ONLY): 24 min  Charges:  $Gait Training: 8-22 mins $Therapeutic Exercise: 8-22 mins                    G Codes:       [name], [place]   [place] 01/05/2017, 1:20 PM

## 2017-01-05 NOTE — Progress Notes (Addendum)
Advanced Heart Failure Rounding Note   Subjective:    RHC on 6/1 as below showed relatively well compensated hemodynamics with mild RV failure on milrinone 0.125. Milrinone stopped and sildenafil started.   01/03/17 became hypotensive and nauseated. Co-ox in 32s. Sildenafil stopped and started back on milrinone.   Coox 56% on milrinone 0.375 mcg/kg/min.   Feeling a little better this am. Remains in AF but rates stable in 80s.  No nausea, CP, or COB. Walked halls this am without difficulty.   RHC (6/1) Done on milrinone 0.11mcg/kg/min  RA = 10  RV = 40/15 PA = 42/15 (28) PCW = 16 Fick cardiac output/index = 6.1/3.3 Thermo CO/CI = 5.0/2.7 PVR = 2.4 FA sat = 96% PA sat = 63%. 64%  Objective:    Vital Signs:   Temp:  [97.7 F (36.5 C)-98.1 F (36.7 C)] 98.1 F (36.7 C) (06/04 0409) Pulse Rate:  [83-86] 86 (06/04 0409) Resp:  [18] 18 (06/04 0409) BP: (110-133)/(60-64) 110/60 (06/04 0409) SpO2:  [87 %-94 %] 87 % (06/04 0409) Weight:  [177 lb 11.2 oz (80.6 kg)] 177 lb 11.2 oz (80.6 kg) (06/04 0409) Last BM Date: 01/04/17  Weight change: Filed Weights   01/03/17 0415 01/04/17 0300 01/05/17 0409  Weight: 176 lb 4.8 oz (80 kg) 178 lb 3.2 oz (80.8 kg) 177 lb 11.2 oz (80.6 kg)   Intake/Output:   Intake/Output Summary (Last 24 hours) at 01/05/17 0937 Last data filed at 01/05/17 0425  Gross per 24 hour  Intake           816.18 ml  Output             1300 ml  Net          -483.82 ml    Physical Exam: General:Lying in bed. NAD.  HEENT: normal Neck: Supple. JVP 8-9 cm. Carotids 2+ bilat; no bruits. No thyromegaly or nodule noted. Cor: PMI nondisplaced. Irregular, No M/G/R noted Lungs: CTAB, normal effort Abdomen: soft, non-tender, distended, no HSM. No bruits or masses. +BS  Extremities: no cyanosis, clubbing, or rash. R and LLE no edema.  Neuro: alert & orientedx3, cranial nerves grossly intact. moves all 4 extremities w/o difficulty. Affect pleasant   Telemetry:  Personally reviewed, AF 70-80s   Labs: Basic Metabolic Panel:  Recent Labs Lab 01/01/17 0447 01/02/17 0412 01/02/17 1407 01/03/17 0501 01/03/17 1203 01/04/17 0425 01/05/17 0455  NA 140 140  --  134*  --  135 135  K 4.5 4.3  --  5.2* 5.2* 4.3 3.9  CL 103 104  --  100*  --  102 102  CO2 28 29  --  24  --  25 27  GLUCOSE 102* 86  --  95  --  128* 130*  BUN 53* 53*  --  54*  --  62* 52*  CREATININE 1.39* 1.41* 1.51* 1.79*  --  1.72* 1.28*  CALCIUM 8.9 9.0  --  8.3*  --  8.5* 8.7*  MG  --   --   --   --   --   --  2.5*   CBC:  Recent Labs Lab 01/02/17 0412 01/02/17 1407 01/03/17 0501 01/04/17 0425 01/05/17 0455  WBC 6.6 7.5 7.4 8.5 6.7  HGB 9.3* 9.9* 8.8* 8.7* 8.5*  HCT 30.6* 32.8* 30.0* 28.8* 28.2*  MCV 87.9 88.2 89.0 88.1 87.6  PLT 291 295 270 251 243   BNP: BNP (last 3 results)  Recent Labs  11/27/16 0159 12/17/16 1255  BNP 920.0* 1,062.6*    Medications:     Scheduled Medications: . atorvastatin  40 mg Oral q1800  . Chlorhexidine Gluconate Cloth  6 each Topical Daily  . docusate sodium  200 mg Oral Daily  . enoxaparin (LOVENOX) injection  30 mg Subcutaneous Q24H  . feeding supplement (ENSURE ENLIVE)  237 mL Oral BID BM  . folic acid  1 mg Oral Daily  . glipiZIDE  5 mg Oral Q breakfast  . insulin aspart  0-15 Units Subcutaneous TID WC  . polyethylene glycol  17 g Oral Daily  . sodium chloride flush  3 mL Intravenous Q12H    Infusions: . sodium chloride 250 mL (01/04/17 2030)  . amiodarone 30 mg/hr (01/05/17 0540)  . milrinone 0.375 mcg/kg/min (01/04/17 2318)    PRN Medications: sodium chloride, acetaminophen, ALPRAZolam, ALPRAZolam, alum & mag hydroxide-simeth, bisacodyl **OR** bisacodyl, magnesium hydroxide, miconazole, ondansetron (ZOFRAN) IV, oxyCODONE, sodium chloride flush, sodium chloride flush, sodium phosphate, traMADol   Assessment/Plan/Discussion    1. Acute on chronic systolic HF -> Cardiogenic Shock - RHC 6/1 with mild RV failure  otherwise well compensated. - Milrinone stopped on 6/1 and developed recurrent shock  - Coox 56% on milrinone 0.375 mcg/kg/min daily.  Will need for home. Likely home later this week. Have discussed with [facility].  - AF likely complicating picture. Now back on amio. 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 No MRI with ICD. Will f/u with Radiology regarding TPY study. To discuss further today.  - Will not add dig given concern over possible amyloid - can reconsider as needed.  - Off losartan and spiro. Creatinine improved today.  2.  Left pleural effusion - s/p Thoracentesis 12/22/16 with 1.2 L out.  - CXR 5/23 with mild improvement. Have ordered repeat for tomorrow am.  3. 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  Continue statin. Last ASA dose was 5/18, it was discontinued due to hematuria.  - No CP.  4. PAF - Back in AF as of 6/2. Likely worsening hemodynamics. Amio restarted. Rate controlled - Off heparin due to hematuria. - Will resume Eliquis (less bleeding). Dosing per pharm. (5 mg BID likely appropriate) 4. Hematuria - CT ab negative.  - Received 1 UPRBC 12/21/16 with improvement of Hgb. (Total of 3 UPRBCs thus far) - Foley removed. No hematuria per patient. - Stable. No re-occurrence. Resuming anticoagulation as above.  - tolerating enox for DVT prophylaxis 5. AKI: - Creatinine much improved with resumption of milrinone.  6. UTI: -  On bactrim 5/14 through 5/16. Switched to rocephin 5/16 . Now off. Urine CX + EColi  - Blood cultures negative - Denies dysuria  7. Symptomatic anemia - acute blood loss due to hematuria - Improved s/p transfusion 12/21/16   - Hgb relatively stable at 8.5 today.   Shirley Friar, PA-C 01/05/2017   Advanced Heart Failure Team Pager [phone] (M-F; 7a - 4p)  Please contact [facility] for night-coverage after hours (4p -7a ) and  weekends on [email or web address]   Patient seen and examined with the above-signed Advanced Practice Provider and/or Housestaff. I personally reviewed laboratory data, imaging studies and relevant notes. I independently examined the patient and formulated the important aspects of the plan. I have edited the note to reflect any of my changes or salient points. I have personally discussed the plan with the patient and/or family.  Co-ox remains marginal but symptomatically  improved with milrinone. Will continue inotropic support for her RV. I remain concerned about possible TTR amyloid (SPEP negative). I have made several calls to Radiology attempting to follow up on availability of technetium pyrophosphate scan but we have not heard back.   Remains in AF. Rate controlled on IV amio. Will resume anticoagulation. Start Eliquis. Watch closely for recurrent hematuria. May need to consider TEE/DC-CV.   Will begin arrangements for home inotropes.   Glori Bickers, MD  1:36 PM

## 2017-01-05 NOTE — Progress Notes (Signed)
Occupational Therapy Treatment Patient Details Name: [name] MRN: [id number] DOB: 01/11/1946 Today's Date: 01/05/2017    History of present illness 71 yo s/p NSTEMI with CABG 5/3. PMhx: CAD, CHF, VT, orthostatic hypotension, ICD.  Transferred to ICU on due to cardiogenic shock on 5/16 with lower HGBs.  Pt also noticed to have clots in her urine, so heparin stopped and CT of abdomen ordered which did not show any acute processes in the abdomen, but did show bil pleural effusions.     OT comments  This 71 yo female admitted and underwent above presents to acute OT with continued deficits in balance and endurance for activity. She will continue to benefit from acute OT with follow up OT at home with 24 hour S/prn A recommended post D/C.  HR 104 O2 sats on RA post going to bathroom 90%    Follow Up Recommendations  Home health OT;Supervision/Assistance - 24 hour    Equipment Recommendations  3 in 1 bedside commode       Precautions / Restrictions Precautions Precautions: Fall;Sternal Precaution Comments: generalized sternal precautions without cues--she would start to do something that she should not she would self correct       Mobility Bed Mobility               General bed mobility comments: EOB on arrival   Transfers Overall transfer level: Needs assistance Equipment used: Rolling walker (2 wheeled) Transfers: Sit to/from Stand Sit to Stand: Min guard         General transfer comment: placed hands on knees without cues     Balance Overall balance assessment: Needs assistance Sitting-balance support: Feet supported;No upper extremity supported Sitting balance-Leahy Scale: Good     Standing balance support: No upper extremity supported;During functional activity Standing balance-Leahy Scale: Fair Standing balance comment: no LOB while standing to fix pad in underwear but stating she feels SOB and has so since she has been in A-fib                           ADL either performed or assessed with clinical judgement   ADL Overall ADL's : Needs assistance/impaired     Grooming: Wash/dry face;Standing;Min guard Grooming Details (indicate cue type and reason): use of electric razor for chin hair                 Toilet Transfer: Min guard;Ambulation;RW;Regular Museum/gallery exhibitions officer and Hygiene: Min guard;Sit to/from stand               Vision Patient Visual Report: No change from baseline            Cognition Arousal/Alertness: Awake/alert Behavior During Therapy: Flat affect Overall Cognitive Status: Within Functional Limits for tasks assessed                                 General Comments: pt a bit tearful due to not feeling good                   Pertinent Vitals/ Pain       Pain Assessment: No/denies pain         Frequency  Min 2X/week        Progress Toward Goals  OT Goals(current goals can now be found in the care plan section)  Progress towards OT goals: Not progressing toward  goals - comment (pt not doing as well as she was last OT session (today at a  min A level and last session setup/S)--pt reports her leg is botheirng her and that she feels SOB when she moves)     Plan Discharge plan remains appropriate       AM-PAC PT "6 Clicks" Daily Activity     Outcome Measure   Help from another person eating meals?: None Help from another person taking care of personal grooming?: A Little Help from another person toileting, which includes using toliet, bedpan, or urinal?: A Little Help from another person bathing (including washing, rinsing, drying)?: A Little Help from another person to put on and taking off regular upper body clothing?: None Help from another person to put on and taking off regular lower body clothing?: A Little 6 Click Score: 20    End of Session Equipment Utilized During Treatment: Rolling walker  OT Visit Diagnosis:  Unsteadiness on feet (R26.81);History of falling (Z91.81)   Activity Tolerance  (limited by SOB)   Patient Left  (sitting EOB maybe going to try and eat some food that her family brought in for her)   Nurse Communication          Time: [phone] OT Time Calculation (min): 20 min  Charges: OT General Charges $OT Visit: 1 Procedure OT Treatments $Self Care/Home Management : 8-22 mins  [place] [address], OTR/L [phone] 01/05/2017

## 2017-01-05 NOTE — Consult Note (Addendum)
   [facility] Inpatient Consult   01/05/2017  [place] [birth year] [id number]    [facility] Care Management follow up.  Went to bedside to speak with [name]. However, she was on the phone upon writer's arrival and then Surgeon came to visit.   Will come back at later time.   Kayla Rolling, MSN-Ed, RN,BSN [facility] Liaison [phone]

## 2017-01-05 NOTE — Care Management Important Message (Signed)
Important Message  Patient Details  Name: [name] MRN: [id number] Date of Birth: 1946-02-26   Medicare Important Message Given:  Yes    Nathen May 01/05/2017, 12:16 PM

## 2017-01-05 NOTE — Consult Note (Signed)
   [facility] Inpatient Consult   01/05/2017  [name] 05-13-1946 [id number]    Came back to bedside to speak with [name]. However, she was on the phone. Asked that writer come back again later. Made inpatient RNCM aware.   [name], MSN-Ed, RN,BSN [facility] Liaison [phone]

## 2017-01-05 NOTE — Progress Notes (Signed)
[facility]  Ms. Patman is a new pt for [facility] this hospital admission. [facility] will provide HHRN and Home Inotrope team to support home Milrinone.  [facility] hospital infusion coordinator will provide in hospital teaching regarding home Milrinone/HF program to support transition  Home.  [facility] team will follow pt until DC to ensure [facility] needs are me for home.  If patient discharges after hours, please call [phone].   Larry Sierras 01/05/2017, 10:14 AM

## 2017-01-06 ENCOUNTER — Ambulatory Visit: Payer: PPO | Admitting: Thoracic Surgery (Cardiothoracic Vascular Surgery)

## 2017-01-06 ENCOUNTER — Inpatient Hospital Stay (HOSPITAL_COMMUNITY): Payer: PPO

## 2017-01-06 LAB — GLUCOSE, CAPILLARY
GLUCOSE-CAPILLARY: 110 mg/dL — AB (ref 65–99)
Glucose-Capillary: 106 mg/dL — ABNORMAL HIGH (ref 65–99)
Glucose-Capillary: 190 mg/dL — ABNORMAL HIGH (ref 65–99)
Glucose-Capillary: 138 mg/dL — ABNORMAL HIGH (ref 65–99)

## 2017-01-06 LAB — CBC
HCT: 27.8 % — ABNORMAL LOW (ref 36.0–46.0)
Hemoglobin: 8.4 g/dL — ABNORMAL LOW (ref 12.0–15.0)
MCH: 26.4 pg (ref 26.0–34.0)
MCHC: 30.2 g/dL (ref 30.0–36.0)
MCV: 87.4 fL (ref 78.0–100.0)
Platelets: 257 10*3/uL (ref 150–400)
RBC: 3.18 MIL/uL — AB (ref 3.87–5.11)
RDW: 15.8 % — ABNORMAL HIGH (ref 11.5–15.5)
WBC: 6.1 10*3/uL (ref 4.0–10.5)

## 2017-01-06 LAB — BASIC METABOLIC PANEL
ANION GAP: 10 (ref 5–15)
BUN: 46 mg/dL — ABNORMAL HIGH (ref 6–20)
CALCIUM: 8.7 mg/dL — AB (ref 8.9–10.3)
CO2: 26 mmol/L (ref 22–32)
CREATININE: 1.32 mg/dL — AB (ref 0.44–1.00)
Chloride: 100 mmol/L — ABNORMAL LOW (ref 101–111)
GFR calc Af Amer: 47 mL/min — ABNORMAL LOW (ref >60)
GFR, EST NON AFRICAN AMERICAN: 40 mL/min — AB (ref >60)
GLUCOSE: 101 mg/dL — AB (ref 65–99)
Potassium: 4 mmol/L (ref 3.5–5.1)
Sodium: 136 mmol/L (ref 135–145)

## 2017-01-06 LAB — COOXEMETRY PANEL
CARBOXYHEMOGLOBIN: 1.2 % (ref 0.5–1.5)
METHEMOGLOBIN: 1.3 % (ref 0.0–1.5)
O2 SAT: 95.1 %
Total hemoglobin: 8.3 g/dL — ABNORMAL LOW (ref 12.0–16.0)

## 2017-01-06 MED ORDER — TECHNETIUM TC 99M-LABELED RED BLOOD CELLS IV KIT
20.5000 | PACK | Freq: Once | INTRAVENOUS | Status: AC | PRN
  Administered 2017-01-06: 20.5 via INTRAVENOUS

## 2017-01-06 MED ORDER — FUROSEMIDE 80 MG PO TABS
80.0000 mg | ORAL_TABLET | Freq: Every day | ORAL | Status: DC
  Administered 2017-01-06 – 2017-01-07 (×2): 80 mg via ORAL
  Filled 2017-01-06 (×2): qty 1

## 2017-01-06 NOTE — Consult Note (Signed)
   [facility] Inpatient Consult   01/06/2017  [place] [birth year] [id number]     [facility] Care Management follow up. Spoke with Ms. Cordell at bedside. Discussed her discharge plans. She states she plans on returning home but is open to go to SNF. She prefers going to [facility] if she does indeed go to SNF. However, not sure if [facility] would accept IV  Milrinone. Ms. Filkins states she will think about her options. She states she is still awaiting test results. She became tearful during bedside visit. Support and encouragement given.  Discussed above with inpatient RNCM. Will continue to closely follow.   [name], MSN-Ed, RN,BSN [facility] Liaison [phone]

## 2017-01-06 NOTE — Care Management Note (Signed)
Case Management Note Previous CM note initiated by Bethena Roys, RN 12/02/2016, 2:50 PM   Patient Details  Name: [name] MRN: [id number] Date of Birth: [birth year]  Subjective/Objective: Pt presented for chest pain - post cardiac cath 11-28-16 revealled 3 vessel CAD- Plan for CABG on Thursday. Pt is from home with daughter and grandson.             Action/Plan: CM will continue to monitor for disposition needs post d/c.   Expected Discharge Date:                  Expected Discharge Plan:  [name]  In-House Referral:  Clinical Social Work  Discharge planning Services  CM Consult  Post Acute Care Choice:  Durable Medical Equipment, Home Health Choice offered to:  Patient  DME Arranged:  3-N-1 DME Agency:  [place]:  RN [facility] Agency:  [place]  Status of Service:  In process, will continue to follow  If discussed at Long Length of Stay Meetings, dates discussed:  5/15, 5/17, 5/22, 5/24, 6/5  Additional Comments:  01/06/17- 1000- Nami Strawder RN, CM- pt continues with afib and HF- IV amio and IV milrinone- plan for TEE cardioversion on 6/6- plan will be to go home on IV milrinone- [facility] has been contacted for home IV milrinone needs- spoke with pt at bedside choice offered for [facility]- ok with using [facility] for [facility] needs- per conversation with pt she does not feel like she wants HHPT/OT- and will plan to do outpt cardiac rehab- will need orders for [facility] for IV needs. Pt also reports that she has RW at home already but does want a 3n1 for home- have requested DME order for d/c. - referral received for Eliquis- per insurance check- copay cost $45- pt given 30 day free card- to use on d/c-  Have spoken with both Pam and Santiago Glad with [facility] who are following for [facility] needs referral has been given to [facility]- CM to continue to follow  12/26/16- 1600- Zyniah Ferraiolo rN, CM- pt has returned to 2W- progressing well- may be able to go  home with [facility] vs SNF- CM to continue to follow  12/18/16- 1020- Saleena Tamas RN, CM- pt with decline yesterday tx back to ICU-2H for further care-with IV norepi and milrinone started. Also diuresed with IV lasix and IV amio for Afib. CM and CSW to continue to follow for d/c needs.   12/12/16- 1000- Braxton Weisbecker RN, CM- pt tx from [facility] to 2W on 5/9- CSW following for SNF placement- pt with post op afib-   12/09/16- 1125-  Valentina Gu, CM-  Pt s/p CABGx4 on 5/3-  Remains on 2H post op  Dawayne Patricia, RN 01/06/2017, 10:00 AM [phone]

## 2017-01-06 NOTE — Progress Notes (Signed)
CARDIAC REHAB PHASE I   PRE:  Rate/Rhythm: 84 a fib  BP:  Sitting: 98/67        SaO2: 95 RA  MODE:  Ambulation: 40 ft   POST:  Rate/Rhythm: 98 a fib  BP:  Sitting: 93/66         SaO2: 94 RA  Pt agreeable to walk, states she is very tired today. Pt ambulated 40 ft on RA, IV, rolling walker, assist x1, slow, mostly steady gait, tolerated fair. Pt c/o sudden onset dizziness, requested to return to room, VSS. BP somewhat lower today. Pt not in very good spirits after walk. Emotional support given to pt. Pt to edge of bed per pt request, call bell within reach. Will follow-up tomorrow.   [place], RN, BSN 01/06/2017 2:48 PM

## 2017-01-06 NOTE — Progress Notes (Addendum)
      [address]       [place] [address]             [phone]      4 Days Post-Op Procedure(s) (LRB): Right Heart Cath (N/A) Subjective: Depressed this morning. Talking on the phone off and on.   Objective: Vital signs in last 24 hours: Temp:  [97.8 F (36.6 C)-98.7 F (37.1 C)] 97.9 F (36.6 C) (06/05 0431) Pulse Rate:  [84-95] 87 (06/05 0431) Cardiac Rhythm: Atrial fibrillation;Bundle branch block (06/05 0719) Resp:  [18-19] 19 (06/05 0431) BP: (106-129)/(63-71) 106/68 (06/05 0431) SpO2:  [93 %] 93 % (06/05 0431) Weight:  [81.2 kg (179 lb 1.6 oz)] 81.2 kg (179 lb 1.6 oz) (06/05 0431)     Intake/Output from previous day: 06/04 0701 - 06/05 0700 In: -  Out: 1245 [Urine:1350] Intake/Output this shift: No intake/output data recorded.  General appearance: alert, cooperative and no distress Heart: irregularly irregular Lungs: clear to auscultation bilaterally Abdomen: soft, non-tender; bowel sounds normal; no masses,  no organomegaly Extremities: extremities normal, atraumatic, no cyanosis or edema Wound: clean and dry  Lab Results:  Recent Labs  01/05/17 0455 01/06/17 0507  WBC 6.7 6.1  HGB 8.5* 8.4*  HCT 28.2* 27.8*  PLT 243 257   BMET:  Recent Labs  01/05/17 0455 01/06/17 0507  NA 135 136  K 3.9 4.0  CL 102 100*  CO2 27 26  GLUCOSE 130* 101*  BUN 52* 46*  CREATININE 1.28* 1.32*  CALCIUM 8.7* 8.7*    PT/INR: No results for input(s): LABPROT, INR in the last 72 hours. ABG    Component Value Date/Time   PHART 7.511 (H) 01/02/2017 0836   HCO3 28.9 (H) 01/02/2017 0843   HCO3 27.7 01/02/2017 0843   TCO2 30 01/02/2017 0843   TCO2 29 01/02/2017 0843   ACIDBASEDEF 1.0 12/04/2016 2343   O2SAT 95.1 01/06/2017 0500   CBG (last 3)   Recent Labs  01/05/17 1657 01/05/17 2037 01/06/17 0613  GLUCAP 125* 206* 110*    Assessment/Plan: S/P Procedure(s) (LRB): Right Heart Cath (N/A)  1. CV- atrial fibrillation, rate  controlled 70s-90s. BP well controlled.  IV Amio and IV Milrinone. AHF team managing. coox 95.1 2. Pulm-tolerating room air. CXR today, await read however appears stable 3. Renal-creatinine down to 1.32. Continuing intermittent Lasix per heart failure.  4. H and H stable 5. Endo-blood glucose well controlled.  6. N/V- resolved however states that food doesn't have taste therefore she isn't eating. She is drinking ensure.   Plan: Continue ambulation and cardiac rehab. Pulm toilet, increase oral intake as able. Medication plan per heart failure.     LOS: 40 days    Elgie Collard 01/06/2017 Patient seen and examined, agree with above D/w Dr. Haroldine Laws    technectium scan today  TEE cardioversion tomorrow  Home Thursday if all goes well  [name], MD [facility] [phone]

## 2017-01-06 NOTE — Consult Note (Signed)
   [facility] CM Inpatient Consult   01/06/2017  [name] [birth year] [id number]   Came by to speak with the patient and she was not in the room currently.  Inpatient RNCM was not available. Notes reviewed for disposition and needs.

## 2017-01-06 NOTE — Progress Notes (Signed)
Physical Therapy Treatment Patient Details Name: [name] MRN: [id number] DOB: 08-Dec-1945 Today's Date: 01/06/2017    History of Present Illness 71 yo s/p NSTEMI with CABG 5/3. PMhx: CAD, CHF, VT, orthostatic hypotension, ICD.  Transferred to ICU on due to cardiogenic shock on 5/16 with lower HGBs.  Pt also noticed to have clots in her urine, so heparin stopped and CT of abdomen ordered which did not show any acute processes in the abdomen, but did show bil pleural effusions.      PT Comments    Pt making slow, steady progress.   Follow Up Recommendations  Home health PT;Supervision/Assistance - 24 hour     Equipment Recommendations  Rolling walker with 5" wheels;3in1 (PT)    Recommendations for Other Services       Precautions / Restrictions Precautions Precautions: Fall;Sternal Restrictions Weight Bearing Restrictions: No (sternal precaution)    Mobility  Bed Mobility               General bed mobility comments: Pt sitting EOB  Transfers Overall transfer level: Needs assistance Equipment used: Rolling walker (2 wheeled) Transfers: Sit to/from Stand Sit to Stand: Min assist         General transfer comment: Assist for balance. Pt with initial stagger on standing  Ambulation/Gait Ambulation/Gait assistance: Min guard;Min assist Ambulation Distance (Feet): 170 Feet Assistive device: Rolling walker (2 wheeled) Gait Pattern/deviations: Step-through pattern;Decreased stride length;Staggering left Gait velocity: decreased Gait velocity interpretation: Below normal speed for age/gender General Gait Details: Assist initially for a few staggering steps requiring min A for balance and then min guard after that. Two standing rest breaks   Stairs            Wheelchair Mobility    Modified Rankin (Stroke Patients Only)       Balance Overall balance assessment: Needs assistance Sitting-balance support: Feet supported;No upper extremity  supported Sitting balance-Leahy Scale: Good     Standing balance support: Single extremity supported Standing balance-Leahy Scale: Poor Standing balance comment: Needed support with static standing                            Cognition Arousal/Alertness: Awake/alert Behavior During Therapy: WFL for tasks assessed/performed Overall Cognitive Status: Within Functional Limits for tasks assessed                                        Exercises      General Comments        Pertinent Vitals/Pain Pain Assessment: No/denies pain    Home Living                      Prior Function            PT Goals (current goals can now be found in the care plan section) Progress towards PT goals: Progressing toward goals    Frequency    Min 3X/week      PT Plan Current plan remains appropriate    Co-evaluation              AM-PAC PT "6 Clicks" Daily Activity  Outcome Measure  Difficulty turning over in bed (including adjusting bedclothes, sheets and blankets)?: A Little Difficulty moving from lying on back to sitting on the side of the bed? : A Little Difficulty sitting down on and standing  up from a chair with arms (e.g., wheelchair, bedside commode, etc,.)?: Total Help needed moving to and from a bed to chair (including a wheelchair)?: A Little Help needed walking in hospital room?: A Little Help needed climbing 3-5 steps with a railing? : A Little 6 Click Score: 16    End of Session Equipment Utilized During Treatment: Gait belt Activity Tolerance: Patient tolerated treatment well Patient left: in bed;with call bell/phone within reach   PT Visit Diagnosis: Muscle weakness (generalized) (M62.81);Difficulty in walking, not elsewhere classified (R26.2)     Time: 7414-2395 PT Time Calculation (min) (ACUTE ONLY): 10 min  Charges:  $Gait Training: 8-22 mins                    G Codes:       [facility] PT [place] 01/06/2017, 9:35 AM

## 2017-01-06 NOTE — Progress Notes (Signed)
Advanced Heart Failure Rounding Note   Subjective:    RHC on 6/1 as below showed relatively well compensated hemodynamics with mild RV failure on milrinone 0.125. Milrinone stopped and sildenafil started.   01/03/17 became hypotensive and nauseated. Co-ox in 80s. Sildenafil stopped and started back on milrinone.   Remains on 0.335mcg of milrinone. Says she feels better today than yesterday. Will repeat Co ox. Denies SOB, chest pain.   RHC (6/1) Done on milrinone 0.185mcg/kg/min  RA = 10  RV = 40/15 PA = 42/15 (28) PCW = 16 Fick cardiac output/index = 6.1/3.3 Thermo CO/CI = 5.0/2.7 PVR = 2.4 FA sat = 96% PA sat = 63%. 64%  Objective:    Vital Signs:   Temp:  [97.8 F (36.6 C)-98.7 F (37.1 C)] 97.9 F (36.6 C) (06/05 0431) Pulse Rate:  [84-95] 87 (06/05 0431) Resp:  [18-19] 19 (06/05 0431) BP: (106-129)/(63-71) 106/68 (06/05 0431) SpO2:  [93 %] 93 % (06/05 0431) Weight:  [179 lb 1.6 oz (81.2 kg)] 179 lb 1.6 oz (81.2 kg) (06/05 0431) Last BM Date: 01/04/17  Weight change: Filed Weights   01/04/17 0300 01/05/17 0409 01/06/17 0431  Weight: 178 lb 3.2 oz (80.8 kg) 177 lb 11.2 oz (80.6 kg) 179 lb 1.6 oz (81.2 kg)   Intake/Output:   Intake/Output Summary (Last 24 hours) at 01/06/17 0757 Last data filed at 01/06/17 0700  Gross per 24 hour  Intake                0 ml  Output             1350 ml  Net            -1350 ml    Physical Exam: General:Female, sitting on edge of bed.   HEENT: normal Neck: Supple. JVP 9 cm. Carotids 2+ bilat; no bruits. No thyromegaly or nodule noted. Cor: PMI nondisplaced.Irregularly irregular. No M/G/R noted Lungs: Clear bilaterally.  Abdomen: soft, Non-tender, non- distended, no HSM. No bruits or masses. +BS  Extremities: no cyanosis, clubbing, or rash.Trace right LLE pretibial edema.  Neuro: alert & orientedx3, cranial nerves grossly intact. moves all 4 extremities w/o difficulty. Affect flat.   Telemetry: Afib, rates in the  90-100's.   Labs: Basic Metabolic Panel:  Recent Labs Lab 01/02/17 0412 01/02/17 1407 01/03/17 0501 01/03/17 1203 01/04/17 0425 01/05/17 0455 01/06/17 0507  NA 140  --  134*  --  135 135 136  K 4.3  --  5.2* 5.2* 4.3 3.9 4.0  CL 104  --  100*  --  102 102 100*  CO2 29  --  24  --  25 27 26   GLUCOSE 86  --  95  --  128* 130* 101*  BUN 53*  --  54*  --  62* 52* 46*  CREATININE 1.41* 1.51* 1.79*  --  1.72* 1.28* 1.32*  CALCIUM 9.0  --  8.3*  --  8.5* 8.7* 8.7*  MG  --   --   --   --   --  2.5*  --    CBC:  Recent Labs Lab 01/02/17 1407 01/03/17 0501 01/04/17 0425 01/05/17 0455 01/06/17 0507  WBC 7.5 7.4 8.5 6.7 6.1  HGB 9.9* 8.8* 8.7* 8.5* 8.4*  HCT 32.8* 30.0* 28.8* 28.2* 27.8*  MCV 88.2 89.0 88.1 87.6 87.4  PLT 295 270 251 243 257   BNP: BNP (last 3 results)  Recent Labs  11/27/16 0159 12/17/16 1255  BNP 920.0* 1,062.6*  Medications:     Scheduled Medications: . apixaban  5 mg Oral BID  . atorvastatin  40 mg Oral q1800  . Chlorhexidine Gluconate Cloth  6 each Topical Daily  . docusate sodium  200 mg Oral Daily  . feeding supplement (ENSURE ENLIVE)  237 mL Oral BID BM  . folic acid  1 mg Oral Daily  . glipiZIDE  5 mg Oral Q breakfast  . insulin aspart  0-15 Units Subcutaneous TID WC  . polyethylene glycol  17 g Oral Daily  . sodium chloride flush  3 mL Intravenous Q12H    Infusions: . sodium chloride 250 mL (01/04/17 2030)  . amiodarone 30 mg/hr (01/05/17 2008)  . milrinone 0.375 mcg/kg/min (01/05/17 2327)    PRN Medications: sodium chloride, acetaminophen, ALPRAZolam, ALPRAZolam, alum & mag hydroxide-simeth, bisacodyl **OR** bisacodyl, magnesium hydroxide, miconazole, ondansetron (ZOFRAN) IV, oxyCODONE, sodium chloride flush, sodium chloride flush, sodium phosphate, traMADol   Assessment/Plan/Discussion    1. Acute on chronic systolic HF -> Cardiogenic Shock - RHC 6/1 with mild RV failure otherwise well compensated. - Milrinone stopped  on 6/1 and developed recurrent shock  - Will need home milrinone. [facility] aware.  - Remains in Afib, will plan for DCCV tomorrow.  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 No MRI with ICD. TPY study today.  - Will not add dig given concern over possible amyloid - can reconsider as needed.  - No losartan/spiro with CKD.  - Will start po Lasix 80mg  today.  2.  Left pleural effusion - s/p Thoracentesis 12/22/16 with 1.2 L out.  - CXR 5/23 with mild improvement.  - Repeat CXR with persistent L pleural effusion consistent with CHF.  3. 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  Continue statin. Last ASA dose was 5/18, it was discontinued due to hematuria.  - Denies chest pain.  4. PAF - Back in AF as of 6/2. Likely worsening hemodynamics. Amio restarted. Rate controlled - Off heparin due to hematuria. - Eliquis started yesterday. Wil plan for DCCV tomorrow.  - Continue amio gtt today. Transition to po tomorrow.  4. Hematuria - CT ab negative.  - Received 1 UPRBC 12/21/16 with improvement of Hgb. (Total of 3 UPRBCs thus far) - Foley removed. No hematuria per patient. - Stable. Continue anticoagulation as above.  - tolerating enox for DVT prophylaxis 5. AKI: - Creatinine much improved with resumption of milrinone.  - Follow daily.  6. UTI: -  On bactrim 5/14 through 5/16. Switched to rocephin 5/16 . Now off. Urine CX + EColi  - Blood cultures negative - Denies dysuria.  7. Symptomatic anemia - acute blood loss due to hematuria - Improved s/p transfusion 12/21/16   - HGB stable at 8.4 today.    Arbutus Leas, NP 01/06/2017   Advanced Heart Failure Team Pager [phone] (M-F; [place])  Please contact [facility] for night-coverage after hours (4p -7a ) and weekends on [email or web address]   Patient seen and examined with [name], NP. We discussed all aspects of the encounter. I agree with the assessment  and plan as stated above.   Improved with milrinone but still fatigued. Volume status mildly elevated. Will continue milrinone. We have worked with Radiology (thank you) to get Technetium Pyrophosphate scan set up today to look for TTR amyloid.   Remains in AF. Plan TEE and DC-CV tomorrow. Remains on amio. Eliquis stedted earlier this week.  She has undergone LAA Clip but will proceed with TEE to make sure no clot in LAA remnant or LA.   Hopefully we can get issues straightened out and get her home Thursday am.   .sig

## 2017-01-07 ENCOUNTER — Encounter (HOSPITAL_COMMUNITY)
Admission: EM | Disposition: A | Discharge status: Home / Self Care | Payer: Self-pay | Source: Home / Self Care | Attending: Thoracic Surgery (Cardiothoracic Vascular Surgery)

## 2017-01-07 ENCOUNTER — Inpatient Hospital Stay (HOSPITAL_COMMUNITY): Admit: 2017-01-07 | Payer: PPO

## 2017-01-07 ENCOUNTER — Inpatient Hospital Stay (HOSPITAL_COMMUNITY): Payer: PPO

## 2017-01-07 DIAGNOSIS — Z48812 Encounter for surgical aftercare following surgery on the circulatory system: Secondary | ICD-10-CM | POA: Diagnosis not present

## 2017-01-07 DIAGNOSIS — I509 Heart failure, unspecified: Secondary | ICD-10-CM | POA: Diagnosis not present

## 2017-01-07 DIAGNOSIS — I2510 Atherosclerotic heart disease of native coronary artery without angina pectoris: Secondary | ICD-10-CM | POA: Diagnosis not present

## 2017-01-07 DIAGNOSIS — L8990 Pressure ulcer of unspecified site, unspecified stage: Secondary | ICD-10-CM | POA: Diagnosis not present

## 2017-01-07 DIAGNOSIS — Z7984 Long term (current) use of oral hypoglycemic drugs: Secondary | ICD-10-CM | POA: Diagnosis not present

## 2017-01-07 DIAGNOSIS — Z951 Presence of aortocoronary bypass graft: Secondary | ICD-10-CM | POA: Diagnosis not present

## 2017-01-07 DIAGNOSIS — Z9981 Dependence on supplemental oxygen: Secondary | ICD-10-CM | POA: Diagnosis not present

## 2017-01-07 DIAGNOSIS — I5033 Acute on chronic diastolic (congestive) heart failure: Secondary | ICD-10-CM | POA: Diagnosis not present

## 2017-01-07 LAB — CBC
HCT: 30.4 % — ABNORMAL LOW (ref 36.0–46.0)
HEMOGLOBIN: 9.1 g/dL — AB (ref 12.0–15.0)
MCH: 26.1 pg (ref 26.0–34.0)
MCHC: 29.9 g/dL — ABNORMAL LOW (ref 30.0–36.0)
MCV: 87.4 fL (ref 78.0–100.0)
PLATELETS: 300 10*3/uL (ref 150–400)
RBC: 3.48 MIL/uL — ABNORMAL LOW (ref 3.87–5.11)
RDW: 16.1 % — ABNORMAL HIGH (ref 11.5–15.5)
WBC: 7.5 10*3/uL (ref 4.0–10.5)

## 2017-01-07 LAB — GLUCOSE, CAPILLARY
Glucose-Capillary: 122 mg/dL — ABNORMAL HIGH (ref 65–99)
Glucose-Capillary: 140 mg/dL — ABNORMAL HIGH (ref 65–99)
Glucose-Capillary: 201 mg/dL — ABNORMAL HIGH (ref 65–99)
Glucose-Capillary: 149 mg/dL — ABNORMAL HIGH (ref 65–99)

## 2017-01-07 LAB — COOXEMETRY PANEL
Carboxyhemoglobin: 1.7 % — ABNORMAL HIGH (ref 0.5–1.5)
Methemoglobin: 0.9 % (ref 0.0–1.5)
O2 SAT: 57.7 %
TOTAL HEMOGLOBIN: 9.5 g/dL — AB (ref 12.0–16.0)

## 2017-01-07 LAB — BASIC METABOLIC PANEL
Anion gap: 10 (ref 5–15)
BUN: 43 mg/dL — ABNORMAL HIGH (ref 6–20)
CHLORIDE: 101 mmol/L (ref 101–111)
CO2: 26 mmol/L (ref 22–32)
CREATININE: 1.33 mg/dL — AB (ref 0.44–1.00)
Calcium: 9 mg/dL (ref 8.9–10.3)
GFR calc Af Amer: 46 mL/min — ABNORMAL LOW (ref >60)
GFR calc non Af Amer: 40 mL/min — ABNORMAL LOW (ref >60)
GLUCOSE: 129 mg/dL — AB (ref 65–99)
POTASSIUM: 4.3 mmol/L (ref 3.5–5.1)
Sodium: 137 mmol/L (ref 135–145)

## 2017-01-07 SURGERY — ECHOCARDIOGRAM, TRANSESOPHAGEAL
Anesthesia: Moderate Sedation

## 2017-01-07 MED ORDER — FUROSEMIDE 10 MG/ML IJ SOLN
40.0000 mg | Freq: Once | INTRAMUSCULAR | Status: AC
  Administered 2017-01-07: 40 mg via INTRAVENOUS
  Filled 2017-01-07: qty 4

## 2017-01-07 MED ORDER — AMIODARONE HCL 200 MG PO TABS
400.0000 mg | ORAL_TABLET | Freq: Two times a day (BID) | ORAL | Status: DC
  Administered 2017-01-07 – 2017-01-08 (×3): 400 mg via ORAL
  Filled 2017-01-07 (×3): qty 2

## 2017-01-07 NOTE — Progress Notes (Signed)
At shift change, pt noted to be SOB, feeling "tighness" in her stomach as main complaint. Unable to lie flat or recline in bed. Lungs clear/diminished, sat 93% on RA. Gave PRN Xanax as pt originally felt to be anxious/excited about discharge. Pt still feeling SOB, no relief from PRN med. Rapid called and stat CXR was obtained. Lucianne Lei Trigt notified and verbal order of IV Lasix obtained. Order carried out. Will continue to closely monitor patient.

## 2017-01-07 NOTE — Care Management Note (Signed)
Case Management Note Previous CM note initiated by Bethena Roys, RN 12/02/2016, 2:50 PM   Patient Details  Name: [name] MRN: [id number] Date of Birth: 1945/10/31  Subjective/Objective: Pt presented for chest pain - post cardiac cath 11-28-16 revealled 3 vessel CAD- Plan for CABG on Thursday. Pt is from home with daughter and grandson.             Action/Plan: CM will continue to monitor for disposition needs post d/c.   Expected Discharge Date:                  Expected Discharge Plan:  [place]  In-House Referral:  Clinical Social Work  Discharge planning Services  CM Consult  Post Acute Care Choice:  Durable Medical Equipment, Home Health Choice offered to:  Patient  DME Arranged:  3-N-1 DME Agency:  [place] Arranged:  RN [employer] Agency:  [place]  Status of Service:  Completed, signed off  If discussed at [place] of Stay Meetings, dates discussed:  5/15, 5/17, 5/22, 5/24, 6/5  Additional Comments:  01/07/17- 0900- Marvetta Gibbons RN, CM- received msg from [place] with HF team- pt may be ready for d/c today. Spoke with pt at bedside to confirm what she wanted to do SNF vs home with [facility]- pt states she has considered both options but has decided she wants to go home with [facility] services- reviewed with pt co-pay cost for [facility] services- pt does not feel like she will need HH therapy and just wants to do the [facility] for IV milrinone needs- with plan to do outpt cardiac rehab once cleared by MD. Returned called to Junie Panning - NP - who will place [facility] orders and DME for 3n1- have been in touch with Pam at [facility] who will see pt this afternoon for home IV milrinone needs prior to discharge- notified Santiago Glad with [facility] for DME needs 3n1 to be delivered to room prior to discharge.    01/06/17- 1000- Ajanee Buren RN, CM- pt continues with afib and HF- IV amio and IV milrinone- plan for TEE cardioversion on 6/6- plan will be to go home on IV  milrinone- [facility] has been contacted for home IV milrinone needs- spoke with pt at bedside choice offered for [facility]- ok with using [facility] for [facility] needs- per conversation with pt she does not feel like she wants HHPT/OT- and will plan to do outpt cardiac rehab- will need orders for [facility] for IV needs. Pt also reports that she has RW at home already but does want a 3n1 for home- have requested DME order for d/c. - referral received for Eliquis- per insurance check- copay cost $45- pt given 30 day free card- to use on d/c-  Have spoken with both Pam and Santiago Glad with [facility] who are following for [facility] needs referral has been given to [facility]- CM to continue to follow  12/26/16- 1600- Tazaria Dlugosz rN, CM- pt has returned to 2W- progressing well- may be able to go home with [facility] vs SNF- CM to continue to follow  12/18/16- 1020- Takesha Steger RN, CM- pt with decline yesterday tx back to ICU-2H for further care-with IV norepi and milrinone started. Also diuresed with IV lasix and IV amio for Afib. CM and CSW to continue to follow for d/c needs.   12/12/16- 1000- Latanza Pfefferkorn RN, CM- pt tx from [facility] to 2W on 5/9- CSW following for SNF placement- pt with  post op afib-   12/09/16- 1125-  Armel Rabbani Laurena Bering, CM-  Pt s/p CABGx4 on 5/3-  Remains on 2H post op  Dawayne Patricia, RN 01/07/2017, 10:14 AM [phone]

## 2017-01-07 NOTE — Progress Notes (Signed)
Notified that pt has transportation issue for today's discharge- have spoken with pt - daughter has had EGD/Colonoscopy today and grandson works evening shift- no else to come provide transportation this afternoon- pt going home on home IV milrinone- can not go via taxi- will plan for d/c tomorrow- have spoken with Pam at [facility] who will plan to come in AM to hook pt up to home IV milrinone pump.

## 2017-01-07 NOTE — Progress Notes (Addendum)
      [address]       [place], [address]             [phone]      5 Days Post-Op Procedure(s) (LRB): Right Heart Cath (N/A) Subjective: She is tearful this morning. She states that it is a great day so far since she is in normal sinus rhythm.   Objective: Vital signs in last 24 hours: Temp:  [97.6 F (36.4 C)-97.7 F (36.5 C)] 97.6 F (36.4 C) (06/06 0449) Pulse Rate:  [89-95] 95 (06/06 0449) Cardiac Rhythm: Heart block (06/06 0700) Resp:  [21-22] 22 (06/06 0449) BP: (98-122)/(63-67) 102/63 (06/06 0449) SpO2:  [92 %-94 %] 92 % (06/05 2000) Weight:  [81.3 kg (179 lb 4.8 oz)] 81.3 kg (179 lb 4.8 oz) (06/06 0449)    Intake/Output from previous day: 06/05 0701 - 06/06 0700 In: -  Out: 1100 [Urine:1100] Intake/Output this shift: No intake/output data recorded.  General appearance: alert, cooperative and no distress Heart: regular rate and rhythm, S1, S2 normal, no murmur, click, rub or gallop Lungs: clear to auscultation bilaterally Abdomen: soft, non-tender; bowel sounds normal; no masses,  no organomegaly Extremities: 1+ pedal edema Wound: clean and dry  Lab Results:  Recent Labs  01/06/17 0507 01/07/17 0428  WBC 6.1 7.5  HGB 8.4* 9.1*  HCT 27.8* 30.4*  PLT 257 300   BMET:  Recent Labs  01/06/17 0507 01/07/17 0428  NA 136 137  K 4.0 4.3  CL 100* 101  CO2 26 26  GLUCOSE 101* 129*  BUN 46* 43*  CREATININE 1.32* 1.33*  CALCIUM 8.7* 9.0    PT/INR: No results for input(s): LABPROT, INR in the last 72 hours. ABG    Component Value Date/Time   PHART 7.511 (H) 01/02/2017 0836   HCO3 28.9 (H) 01/02/2017 0843   HCO3 27.7 01/02/2017 0843   TCO2 30 01/02/2017 0843   TCO2 29 01/02/2017 0843   ACIDBASEDEF 1.0 12/04/2016 2343   O2SAT 57.7 01/07/2017 0500   CBG (last 3)   Recent Labs  01/06/17 1619 01/06/17 2122 01/07/17 0610  GLUCAP 190* 138* 122*    Assessment/Plan: S/P Procedure(s) (LRB): Right Heart Cath (N/A)  1.  CV-NSR in the 73s. BP stable. IV Amio switched to Amio PO. HF arranging home milrinone.  2. Pulm-tolerating room air with good oxygen saturation. CXR yesterday showed  Bilateral pulmonary interstitial prominence and left-sided pleural effusion consistent with CHF.  3. Renal-creatinine 1.33, electrolytes okay. On PO lasix 4. H and H stable 5. Endo-blood glucose level well controlled  Plan: Continue to monitor rhythm closely. HF arranging home milrinone and follow-up. Possibly home later today or tomorrow.      LOS: 41 days    Elgie Collard 01/07/2017 Patient seen and examined, agree with above Plan per AHF team  [name], MD [facility] [phone]

## 2017-01-07 NOTE — Progress Notes (Signed)
CARDIAC REHAB PHASE I   PRE:  Rate/Rhythm: 70 SR  BP:  Supine: 121/66  Sitting:   Standing:    SaO2: 89%RA  MODE:  Ambulation: 200 ft   POST:  Rate/Rhythm: 75 SR  BP:  Supine:   Sitting: 116/62  Standing:    SaO2: 91%RA 1000-1030 Pt feeling better today since she is in NSR. Pt walked 200 ft on RA with rolling walker and minimal asst. Stopped frequently as she stated her right hip and knee bothering her more today. Remained in NSR. To sitting on side of bed after walk.   Graylon Good, RN BSN  01/07/2017 10:27 AM

## 2017-01-07 NOTE — Progress Notes (Signed)
Advanced Heart Failure Rounding Note   Subjective:    RHC on 6/1 as below showed relatively well compensated hemodynamics with mild RV failure on milrinone 0.125. Milrinone stopped and sildenafil started.   01/03/17 became hypotensive and nauseated. Co-ox in 69s. Sildenafil stopped and started back on milrinone.   Remains on 0.380mcg of milrinone. Converted to NSR this morning, rates in the 70's. She is tearful this morning, very depressed. Breathing ok. No orthopnea or PND.   RHC (6/1) Done on milrinone 0.124mcg/kg/min  RA = 10  RV = 40/15 PA = 42/15 (28) PCW = 16 Fick cardiac output/index = 6.1/3.3 Thermo CO/CI = 5.0/2.7 PVR = 2.4 FA sat = 96% PA sat = 63%. 64%  Objective:    Vital Signs:   Temp:  [97.6 F (36.4 C)-97.7 F (36.5 C)] 97.6 F (36.4 C) (06/06 0449) Pulse Rate:  [89-95] 95 (06/06 0449) Resp:  [21-22] 22 (06/06 0449) BP: (98-122)/(63-67) 102/63 (06/06 0449) SpO2:  [92 %-94 %] 92 % (06/05 2000) Weight:  [179 lb 4.8 oz (81.3 kg)] 179 lb 4.8 oz (81.3 kg) (06/06 0449) Last BM Date: 01/05/17  Weight change: Filed Weights   01/05/17 0409 01/06/17 0431 01/07/17 0449  Weight: 177 lb 11.2 oz (80.6 kg) 179 lb 1.6 oz (81.2 kg) 179 lb 4.8 oz (81.3 kg)   Intake/Output:   Intake/Output Summary (Last 24 hours) at 01/07/17 0753 Last data filed at 01/07/17 0400  Gross per 24 hour  Intake                0 ml  Output             1100 ml  Net            -1100 ml    Physical Exam: General:Female, lying in bed. NAD.  HEENT: normal Neck: Supple. 6-7 cm JVP. Carotids 2+ bilat; no bruits. No thyromegaly or nodule noted. Cor: PMI nondisplaced. Regular rate and rhythm. No M/G/R noted Lungs: Clear bilaterally.  Abdomen: soft, NT, ND no HSM. No bruits or masses.  Extremities: no cyanosis, clubbing, or rash. No peripheral edema.   Neuro: alert & orientedx3, cranial nerves grossly intact. moves all 4 extremities w/o difficulty. Affect flat.   Telemetry:NSR rates in the  70's. Personally reviewed   Labs: Basic Metabolic Panel:  Recent Labs Lab 01/03/17 0501 01/03/17 1203 01/04/17 0425 01/05/17 0455 01/06/17 0507 01/07/17 0428  NA 134*  --  135 135 136 137  K 5.2* 5.2* 4.3 3.9 4.0 4.3  CL 100*  --  102 102 100* 101  CO2 24  --  25 27 26 26   GLUCOSE 95  --  128* 130* 101* 129*  BUN 54*  --  62* 52* 46* 43*  CREATININE 1.79*  --  1.72* 1.28* 1.32* 1.33*  CALCIUM 8.3*  --  8.5* 8.7* 8.7* 9.0  MG  --   --   --  2.5*  --   --    CBC:  Recent Labs Lab 01/03/17 0501 01/04/17 0425 01/05/17 0455 01/06/17 0507 01/07/17 0428  WBC 7.4 8.5 6.7 6.1 7.5  HGB 8.8* 8.7* 8.5* 8.4* 9.1*  HCT 30.0* 28.8* 28.2* 27.8* 30.4*  MCV 89.0 88.1 87.6 87.4 87.4  PLT 270 251 243 257 300   BNP: BNP (last 3 results)  Recent Labs  11/27/16 0159 12/17/16 1255  BNP 920.0* 1,062.6*    Medications:     Scheduled Medications: . apixaban  5 mg Oral BID  .  atorvastatin  40 mg Oral q1800  . Chlorhexidine Gluconate Cloth  6 each Topical Daily  . docusate sodium  200 mg Oral Daily  . feeding supplement (ENSURE ENLIVE)  237 mL Oral BID BM  . folic acid  1 mg Oral Daily  . furosemide  80 mg Oral Daily  . glipiZIDE  5 mg Oral Q breakfast  . insulin aspart  0-15 Units Subcutaneous TID WC  . polyethylene glycol  17 g Oral Daily  . sodium chloride flush  3 mL Intravenous Q12H    Infusions: . sodium chloride 250 mL (01/04/17 2030)  . amiodarone 30 mg/hr (01/06/17 1849)  . milrinone 0.375 mcg/kg/min (01/07/17 0051)    PRN Medications: sodium chloride, acetaminophen, ALPRAZolam, ALPRAZolam, alum & mag hydroxide-simeth, bisacodyl **OR** bisacodyl, magnesium hydroxide, miconazole, ondansetron (ZOFRAN) IV, oxyCODONE, sodium chloride flush, sodium chloride flush, sodium phosphate, traMADol   Assessment/Plan/Discussion    1. Acute on chronic systolic HF -> Cardiogenic Shock - RHC 6/1 with mild RV failure otherwise well compensated. - Milrinone stopped on 6/1 and  developed recurrent shock  - Will need home milrinone. [facility] aware.  - TPY study without amyloid - No losartan/spiro with CK, BP soft and she was dizzy yesterday with walk with cardiac rehab.  - Continue 80mg  Lasix daily.  2.  Left pleural effusion - s/p Thoracentesis 12/22/16 with 1.2 L out.  - CXR 5/23 with mild improvement.  - Repeat CXR with persistent L pleural effusion consistent with CHF.  - No change to current plan.  3. 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  Continue statin. Last ASA dose was 5/18, it was discontinued due to hematuria.  - Denies chest pain.  4. PAF - Converted to NSR today. Wil change to po Amio 400mg  BID.  - Off heparin due to hematuria. - Continue Eliquis.  4. Hematuria - CT ab negative.  - Received 1 UPRBC 12/21/16 with improvement of Hgb. (Total of 3 UPRBCs thus far) - Foley removed. No hematuria per patient. - Stable. Continue anticoagulation as above.  - tolerating enox for DVT prophylaxis - No change to current plan.  5. AKI: - Stable. Will have [facility] draw weekly labs.  6. UTI: -  On bactrim 5/14 through 5/16. Switched to rocephin 5/16 . Now off. Urine CX + EColi  - Blood cultures negative - Denies dysuria.  7. Symptomatic anemia - acute blood loss due to hematuria - Improved s/p transfusion 12/21/16   - Hgb stable at 9.1 today.    Dispo: Can dc home today, communicated with [facility] to set up home milrinone. Will see her next week in the CHF clinic.   HF meds for Home: Milrinone 0.375 mcg, lasix 80mg  daily, Eliquis 5mg  BID, Amio 400mg  BID,   Arbutus Leas, NP 01/07/2017   Advanced Heart Failure Team Pager [phone] (M-F; 7a - 4p)  Please contact [facility] for night-coverage after hours (4p -7a ) and weekends on [email or web address]   Patient seen and examined with [name], NP. We discussed all aspects of the encounter. I agree with the assessment and plan as stated above.   Co-ox remains marginal on milrinone but  feeling better. Converted to NSR early this am. No bleeding on eliquis. Volume status ok. Renal function stable. Likely home in am.   Glori Bickers, MD  4:31 PM

## 2017-01-07 NOTE — Progress Notes (Signed)
Occupational Therapy Treatment Patient Details Name: [name] MRN: [id number] DOB: 1946/05/25 Today's Date: 01/07/2017    History of present illness 71 yo s/p NSTEMI with CABG 5/3. PMhx: CAD, CHF, VT, orthostatic hypotension, ICD.  Transferred to ICU on due to cardiogenic shock on 5/16 with lower HGBs.  Pt also noticed to have clots in her urine, so heparin stopped and CT of abdomen ordered which did not show any acute processes in the abdomen, but did show bil pleural effusions.     OT comments  This 71 yo female admitted and underwent above presents to acute OT making progress with basic ADLs of bathing dressing (setup/S) however still needs someone min guard A if ambulating any distance and for transfers. She will continue to benefit from acute OT with follow up OT at home.  Follow Up Recommendations  Home health OT;Supervision/Assistance - 24 hour    Equipment Recommendations  3 in 1 bedside commode       Precautions / Restrictions Precautions Precautions: Fall;Sternal Precaution Comments: generalized sternal precautions without cues--she would start to do something that she should not then she would self correct Restrictions Weight Bearing Restrictions:  (sternal)       Mobility Bed Mobility               General bed mobility comments: Pt sitting EOB upon my arrival  Transfers Overall transfer level: Needs assistance Equipment used: Rolling walker (2 wheeled) Transfers: Sit to/from Stand Sit to Stand: Supervision              Balance Overall balance assessment: Needs assistance Sitting-balance support: Feet supported;No upper extremity supported Sitting balance-Leahy Scale: Good     Standing balance support: Single extremity supported;During functional activity Standing balance-Leahy Scale: Poor Standing balance comment: Needed support with dynamic standing at sink for bathing                           ADL either performed or assessed  with clinical judgement   ADL Overall ADL's : Needs assistance/impaired     Grooming: Supervision/safety;Set up;Standing;Wash/dry face   Upper Body Bathing: Supervision/ safety;Set up;Sitting;Standing (at sink)   Lower Body Bathing: Supervison/ safety;Set up;Sit to/from stand (at sink)   Upper Body Dressing : Supervision/safety;Set up;Sitting   Lower Body Dressing: Supervision/safety;Set up;Sit to/from stand   Toilet Transfer: Min guard;Ambulation;BSC;RW (at sink for bathing)   Toileting- Clothing Manipulation and Hygiene: Supervision/safety;Set up;Sit to/from stand               Vision Patient Visual Report: No change from baseline            Cognition Arousal/Alertness: Awake/alert Behavior During Therapy: WFL for tasks assessed/performed Overall Cognitive Status: Within Functional Limits for tasks assessed                                 General Comments: tearful today due to good news that she had converted to sinus rhythm and home today or tomorrow                   Pertinent Vitals/ Pain       Pain Assessment: Faces Faces Pain Scale: Hurts little more Pain Location: knee to ankle on RLE (joints) with WB'ing Pain Descriptors / Indicators: Sore Pain Intervention(s): Limited activity within patient's tolerance;Monitored during session (made RN aware of pt complaint)  Frequency  Min 2X/week        Progress Toward Goals  OT Goals(current goals can now be found in the care plan section)  Progress towards OT goals: Progressing toward goals     Plan Discharge plan remains appropriate       AM-PAC PT "6 Clicks" Daily Activity     Outcome Measure   Help from another person eating meals?: None Help from another person taking care of personal grooming?: None Help from another person toileting, which includes using toliet, bedpan, or urinal?: A Little Help from another person bathing (including washing, rinsing, drying)?:  None Help from another person to put on and taking off regular upper body clothing?: None Help from another person to put on and taking off regular lower body clothing?: A Little 6 Click Score: 22    End of Session Equipment Utilized During Treatment: Rolling walker  OT Visit Diagnosis: Unsteadiness on feet (R26.81);History of falling (Z91.81)   Activity Tolerance Patient tolerated treatment well   Patient Left  (sitting EOB)   Nurse Communication  (pt asking about stitches in abdomen/lower chest; asking for another tube top, c/o right knee/ankle pain (that has gotten worse since her fall here right after CABG))        Time: 8921-1941 OT Time Calculation (min): 33 min  Charges: OT General Charges $OT Visit: 1 Procedure OT Treatments $Self Care/Home Management : 23-37 mins  [place], OTR/L [phone] 01/07/2017

## 2017-01-07 NOTE — Consult Note (Signed)
   [facility] Inpatient Consult   01/07/2017  Ace[place]unas 28-Nov-1945 [id number]     [facility] Care Management follow up. Chart reviewed. [name] has decided to go home with home health RN services. She plans to go to outpatient cardiac rehab when cleared by MD. Confirmed discharge plans with inpatient RNCM.  Updated [facility] RNCM.   [name], MSN-Ed, RN,BSN [facility] Liaison [phone]

## 2017-01-07 NOTE — Progress Notes (Signed)
Pt supplies for home milrinone delivered. Meds placed in pt refrigerator. Supplies placed in pts room. CM and [facility] RN aware.

## 2017-01-07 NOTE — Progress Notes (Signed)
Nutrition Follow Up  DOCUMENTATION CODES:   Obesity unspecified  INTERVENTION:    Continue Ensure Enlive po BID, each supplement provides 350 kcal and 20 grams of protein  NUTRITION DIAGNOSIS:   Inadequate oral intake related to altered GI function (decreased appetite) as evidenced by per patient/family report, meal completion < 50%, ongoing  GOAL:   Patient will meet greater than or equal to 90% of their needs, progressing  MONITOR:   PO intake, Supplement acceptance, Labs, Weight trends, Skin, I & O's  ASSESSMENT:   Pt with hx of CAD, CHF, PAF, DM, pulmonary HTN admitted 4/26 with chest pain, s/p CABG x 5 on 5/4. Pt with nausea, finally had BM 5/7 after enema.   Pt continues on a Carbohydrate Modified diet. Appetite ok.  PO intake variable at 10-100% per flowsheets. Receiving Ensure Enlive nutrition supplements BID.  Likes. Labs and medications reviewed.  CBG's [id number]. Plan is for discharge in AM.  Diet Order:  Diet Carb Modified Fluid consistency: Thin; Room service appropriate? 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  Estimated Nutritional Needs:   Kcal:  1600-1800  Protein:  95-115 grams  Fluid:  >1.7 L/day  EDUCATION NEEDS:   No education needs identified at this time  Arthur Holms, RD, LDN Pager #: [phone] After-Hours Pager #: [phone]

## 2017-01-08 LAB — HEPATIC FUNCTION PANEL
ALBUMIN: 3 g/dL — AB (ref 3.5–5.0)
ALT: 14 U/L (ref 14–54)
AST: 15 U/L (ref 15–41)
Alkaline Phosphatase: 92 U/L (ref 38–126)
BILIRUBIN INDIRECT: 1.1 mg/dL — AB (ref 0.3–0.9)
Bilirubin, Direct: 0.2 mg/dL (ref 0.1–0.5)
TOTAL PROTEIN: 6.4 g/dL — AB (ref 6.5–8.1)
Total Bilirubin: 1.3 mg/dL — ABNORMAL HIGH (ref 0.3–1.2)

## 2017-01-08 LAB — BASIC METABOLIC PANEL
ANION GAP: 11 (ref 5–15)
BUN: 43 mg/dL — AB (ref 6–20)
CALCIUM: 8.9 mg/dL (ref 8.9–10.3)
CO2: 26 mmol/L (ref 22–32)
CREATININE: 1.31 mg/dL — AB (ref 0.44–1.00)
Chloride: 98 mmol/L — ABNORMAL LOW (ref 101–111)
GFR calc Af Amer: 47 mL/min — ABNORMAL LOW (ref >60)
GFR calc non Af Amer: 41 mL/min — ABNORMAL LOW (ref >60)
GLUCOSE: 135 mg/dL — AB (ref 65–99)
Potassium: 3.9 mmol/L (ref 3.5–5.1)
Sodium: 135 mmol/L (ref 135–145)

## 2017-01-08 LAB — COOXEMETRY PANEL
CARBOXYHEMOGLOBIN: 1.3 % (ref 0.5–1.5)
Carboxyhemoglobin: 1.8 % — ABNORMAL HIGH (ref 0.5–1.5)
METHEMOGLOBIN: 1 % (ref 0.0–1.5)
Methemoglobin: 1.1 % (ref 0.0–1.5)
O2 SAT: 46.1 %
O2 Saturation: 74 %
Total hemoglobin: 14.5 g/dL (ref 12.0–16.0)
Total hemoglobin: 8.4 g/dL — ABNORMAL LOW (ref 12.0–16.0)

## 2017-01-08 LAB — GLUCOSE, CAPILLARY
Glucose-Capillary: 140 mg/dL — ABNORMAL HIGH (ref 65–99)
Glucose-Capillary: 122 mg/dL — ABNORMAL HIGH (ref 65–99)

## 2017-01-08 MED ORDER — FUROSEMIDE 80 MG PO TABS: 80.0000 mg | ORAL_TABLET | Freq: Two times a day (BID) | ORAL | Status: DC

## 2017-01-08 MED ORDER — FUROSEMIDE 10 MG/ML IJ SOLN
40.0000 mg | Freq: Once | INTRAMUSCULAR | Status: AC
  Administered 2017-01-08: 40 mg via INTRAVENOUS
  Filled 2017-01-08: qty 4

## 2017-01-08 MED ORDER — OXYCODONE HCL 5 MG PO TABS: 5.0000 mg | ORAL_TABLET | Freq: Four times a day (QID) | ORAL | 0 refills | Status: DC | PRN

## 2017-01-08 MED ORDER — ATORVASTATIN CALCIUM 40 MG PO TABS: 40.0000 mg | ORAL_TABLET | Freq: Every day | ORAL | 1 refills | Status: DC

## 2017-01-08 MED ORDER — METHYLPREDNISOLONE 4 MG PO TBPK: 8.0000 mg | ORAL_TABLET | Freq: Every evening | ORAL | Status: DC

## 2017-01-08 MED ORDER — METHYLPREDNISOLONE 4 MG PO TBPK: 4.0000 mg | ORAL_TABLET | ORAL | Status: DC

## 2017-01-08 MED ORDER — METHYLPREDNISOLONE 4 MG PO TBPK: ORAL_TABLET | ORAL | 0 refills | Status: DC

## 2017-01-08 MED ORDER — APIXABAN 5 MG PO TABS: 5.0000 mg | ORAL_TABLET | Freq: Two times a day (BID) | ORAL | 1 refills | Status: DC

## 2017-01-08 MED ORDER — FOLIC ACID 1 MG PO TABS: 1.0000 mg | ORAL_TABLET | Freq: Every day | ORAL | 1 refills | Status: DC

## 2017-01-08 MED ORDER — AMIODARONE HCL 200 MG PO TABS: 200.0000 mg | ORAL_TABLET | Freq: Two times a day (BID) | ORAL | 1 refills | Status: DC

## 2017-01-08 MED ORDER — METHYLPREDNISOLONE 4 MG PO TBPK
8.0000 mg | ORAL_TABLET | Freq: Every morning | ORAL | Status: DC
  Filled 2017-01-08: qty 21

## 2017-01-08 MED ORDER — METHYLPREDNISOLONE 4 MG PO TBPK: 4.0000 mg | ORAL_TABLET | Freq: Three times a day (TID) | ORAL | Status: DC

## 2017-01-08 MED ORDER — MILRINONE LACTATE IN DEXTROSE 20-5 MG/100ML-% IV SOLN: 0.3750 ug/kg/min | INTRAVENOUS | 10 refills | Status: DC

## 2017-01-08 MED ORDER — METHYLPREDNISOLONE 4 MG PO TBPK: 4.0000 mg | ORAL_TABLET | Freq: Four times a day (QID) | ORAL | Status: DC

## 2017-01-08 MED ORDER — FUROSEMIDE 40 MG PO TABS: ORAL_TABLET | ORAL | 1 refills | Status: DC

## 2017-01-08 MED FILL — ELIQUIS 5 MG TABLET: 5 | 30 days supply | Qty: 60 | Fill #0

## 2017-01-08 MED FILL — ATORVASTATIN 40 MG TABLET: 40 | 30 days supply | Qty: 30 | Fill #0

## 2017-01-08 MED FILL — AMIODARONE HCL 200 MG TAB: 200 | 30 days supply | Qty: 60 | Fill #0

## 2017-01-08 MED FILL — METHYLPREDNISOLONE 4 MG TAB: 4 | 6 days supply | Qty: 21 | Fill #0

## 2017-01-08 MED FILL — FOLIC ACID 1 MG TABLET: 1 | 30 days supply | Qty: 30 | Fill #0

## 2017-01-08 MED FILL — FUROSEMIDE 40 MG TABLET: 40 | 30 days supply | Qty: 90 | Fill #0

## 2017-01-08 NOTE — Progress Notes (Signed)
Pt reporting some relief from IV lasix, but still feels more SOB than normal. Concerned that more fluid is building back in her lungs. Still feels "tightness" in her abdomen unrelated to bowel fxn. However, pt is now able to lie down more comfortably. Sats 91% on RA this AM. Pt currently resting in bed. Will continue to monitor pt.

## 2017-01-08 NOTE — Progress Notes (Signed)
SATURATION QUALIFICATIONS: (This note is used to comply with regulatory documentation for home oxygen)  Patient Saturations on Room Air at Rest = 95%  Patient Saturations on Room Air while Ambulating = 89%  Patient Saturations on 2 Liters of oxygen while Ambulating = 96%  Please briefly explain why patient needs home oxygen: yes pt needs oxygen; pta sats decreased while ambulating.

## 2017-01-08 NOTE — Progress Notes (Addendum)
[address]       [place] [address]             [phone]      6 Days Post-Op Procedure(s) (LRB): Right Heart Cath (N/A) Subjective: Had a rough night, decreased sats, abdominal discomfort  Objective: Vital signs in last 24 hours: Temp:  [97.5 F (36.4 C)-98.1 F (36.7 C)] 97.5 F (36.4 C) (06/07 0508) Pulse Rate:  [76] 76 (06/07 0508) Cardiac Rhythm: Heart block (06/06 1900) Resp:  [18-20] 18 (06/07 0508) BP: (119-133)/(54-76) 119/54 (06/07 0508) SpO2:  [91 %-93 %] 91 % (06/07 0508) Weight:  [180 lb 11.2 oz (82 kg)] 180 lb 11.2 oz (82 kg) (06/07 0508)  Hemodynamic parameters for last 24 hours:    Intake/Output from previous day: 06/06 0701 - 06/07 0700 In: 360 [P.O.:360] Out: 850 [Urine:850] Intake/Output this shift: No intake/output data recorded.  General appearance: alert, cooperative and no distress Heart: regular rate and rhythm Lungs: dim in left lower fields Abdomen: soft, nontender Extremities: + edema Wound: incis healing well  Lab Results:  Recent Labs  01/06/17 0507 01/07/17 0428  WBC 6.1 7.5  HGB 8.4* 9.1*  HCT 27.8* 30.4*  PLT 257 300   BMET:  Recent Labs  01/07/17 0428 01/08/17 0418  NA 137 135  K 4.3 3.9  CL 101 98*  CO2 26 26  GLUCOSE 129* 135*  BUN 43* 43*  CREATININE 1.33* 1.31*  CALCIUM 9.0 8.9    PT/INR: No results for input(s): LABPROT, INR in the last 72 hours. ABG    Component Value Date/Time   PHART 7.511 (H) 01/02/2017 0836   HCO3 28.9 (H) 01/02/2017 0843   HCO3 27.7 01/02/2017 0843   TCO2 30 01/02/2017 0843   TCO2 29 01/02/2017 0843   ACIDBASEDEF 1.0 12/04/2016 2343   O2SAT 46.1 01/08/2017 0440   CBG (last 3)   Recent Labs  01/07/17 1648 01/07/17 2103 01/08/17 0615  GLUCAP 201* 149* 140*    Meds Scheduled Meds: . amiodarone  400 mg Oral BID  . apixaban  5 mg Oral BID  . atorvastatin  40 mg Oral q1800  . Chlorhexidine Gluconate Cloth  6 each Topical Daily  . docusate  sodium  200 mg Oral Daily  . feeding supplement (ENSURE ENLIVE)  237 mL Oral BID BM  . folic acid  1 mg Oral Daily  . furosemide  80 mg Oral Daily  . glipiZIDE  5 mg Oral Q breakfast  . insulin aspart  0-15 Units Subcutaneous TID WC  . polyethylene glycol  17 g Oral Daily  . sodium chloride flush  3 mL Intravenous Q12H   Continuous Infusions: . sodium chloride 250 mL (01/04/17 2030)  . milrinone 0.375 mcg/kg/min (01/08/17 0529)   PRN Meds:.sodium chloride, acetaminophen, ALPRAZolam, ALPRAZolam, alum & mag hydroxide-simeth, bisacodyl **OR** bisacodyl, magnesium hydroxide, miconazole, ondansetron (ZOFRAN) IV, oxyCODONE, sodium chloride flush, sodium chloride flush, sodium phosphate, traMADol  Xrays Nm Myocar Single W/spect W/wall Motion And Ef  Result Date: 01/06/2017 CLINICAL DATA:  Evaluate for cardiac amyloid. EXAM: NM MYOCARDIAL SINGLE W/SPECT TECHNIQUE: Following the IV administration of the radiopharmaceutical multi planar SPECT imaging of the chest was performed. RADIOPHARMACEUTICALS:  08.6 millicuries technetium 51m pyrophosphate, IV. COMPARISON:  None. FINDINGS: There is no significant radiotracer uptake within the left ventricular myocardium. Normal physiologic distribution of the radiopharmaceutical noted including within postsurgical changes involving the sternum and manubrium. IMPRESSION: 1. Examination is negative for significant radiotracer uptake within  the myocardium to suggest cardiac amyloid. Electronically Signed   By: Kerby Moors M.D.   On: 01/06/2017 14:20   Dg Chest Port 1 View  Result Date: 01/07/2017 CLINICAL DATA:  Acute respiratory distress EXAM: PORTABLE CHEST 1 VIEW COMPARISON:  01/06/2017, 12/24/2016 FINDINGS: Moderate left pleural effusion. Cardiomegaly with central vascular congestion and mild interstitial edema. Right mid lung and left basilar opacity may reflect atelectasis or infiltrate. Aortic atherosclerosis. No pneumothorax. Post sternotomy changes.  Left-sided pacing device as before. Right upper extremity catheter tip overlies the distal SVC. IMPRESSION: 1. Cardiomegaly with moderate left pleural effusion and stable left lower lobe atelectasis or pneumonia. There is vascular congestion and mild pulmonary edema, slightly worsened in the interim. Electronically Signed   By: Donavan Foil M.D.   On: 01/07/2017 21:38    Assessment/Plan: S/P Procedure(s) (LRB): Right Heart Cath (N/A)  1 decreased sats, especially with activity, left pleural effuson may benefit from repeat thoracentesis or consider pleurx. Received IV lasix last night- feels a little better- AHF has increased dose. A little hypochloremic but not alkalotic. Creat/BUN stable- mildly elevated 2 I wonder if there could be a pulmonary toxicity component from amiodarone as CXR has a degree of infiltrative pattern, probably worth while to recheck LFT's as well  3 sugars- fair control  LOS: 42 days    GOLD,WAYNE E 01/08/2017 Patient seen and examined, agree with above For now I think benefit of amiodarone outweighs the risk, but will need to watch closely Home when [place] with AHF team  [name], MD [facility] [phone]

## 2017-01-08 NOTE — Progress Notes (Signed)
RN called for a second set of eyes, pt reports feeling SOB and tightness in her abd, pt unable to lay flat or recline, states "feels like its pushing against my diaphragm." CXR ordered resulting with "vascular congestion and some pulmonary edema."  MD paged PTA, Lasix 40 mg IVP ordered and completed by Colletta Maryland RN

## 2017-01-08 NOTE — Progress Notes (Signed)
Physical Therapy Treatment Patient Details Name: [name] MRN: [id number] DOB: 27-Mar-1946 Today's Date: 01/08/2017    History of Present Illness 71 yo s/p NSTEMI with CABG 5/3. PMhx: CAD, CHF, VT, orthostatic hypotension, ICD.  Transferred to ICU on due to cardiogenic shock on 5/16 with lower HGBs.  Pt with bil pleural effusions.      PT Comments    Pt with flat affect but not tearful today. Pt reports improved SOB and was actually lying supine on arrival. Pt noted to have decreased SpO2 of 87-90% with gait on RA with PA aware and pt placed on 2L after gait with sats 96%. Pt encouraged to perform HEP later today as she was conversing with PA end of session. Pt with slow and inconsistent progression due to continued medical complexities. Will continue to follow.   HR 67-73 BP pre 112/58 (75) Post 94/47 (55)   Follow Up Recommendations  Home health PT;Supervision/Assistance - 24 hour     Equipment Recommendations  Rolling walker with 5" wheels;3in1 (PT)    Recommendations for Other Services       Precautions / Restrictions Precautions Precautions: Fall;Sternal Precaution Comments: watch sats Restrictions Weight Bearing Restrictions: Yes    Mobility  Bed Mobility Overal bed mobility: Needs Assistance Bed Mobility: Rolling;Sidelying to Sit Rolling: Supervision Sidelying to sit: Min assist       General bed mobility comments: cues for sequence with assist to rise today  Transfers Overall transfer level: Needs assistance     Sit to Stand: Supervision         General transfer comment: pt maintained correct hand placement for stand from bed and BSC and with stand pivot, supervision for lines and safety  Ambulation/Gait Ambulation/Gait assistance: Min assist Ambulation Distance (Feet): 250 Feet Assistive device: Rolling walker (2 wheeled) Gait Pattern/deviations: Step-through pattern;Decreased stride length;Trunk flexed   Gait velocity interpretation: Below  normal speed for age/gender General Gait Details: pt with slow steady gait with 3 standing rests during gait. SpO2 87-90% on RA with gait with cues for pursed lip breathing, posture and position in RW for lung expansion. pt reports no issues with Rt knee today   Stairs            Wheelchair Mobility    Modified Rankin (Stroke Patients Only)       Balance Overall balance assessment: Needs assistance   Sitting balance-Leahy Scale: Good       Standing balance-Leahy Scale: Fair                              Cognition Arousal/Alertness: Awake/alert Behavior During Therapy: Flat affect Overall Cognitive Status: Within Functional Limits for tasks assessed                                        Exercises      General Comments        Pertinent Vitals/Pain Pain Assessment: No/denies pain    Home Living                      Prior Function            PT Goals (current goals can now be found in the care plan section) Progress towards PT goals: Progressing toward goals    Frequency  PT Plan Current plan remains appropriate    Co-evaluation              AM-PAC PT "6 Clicks" Daily Activity  Outcome Measure  Difficulty turning over in bed (including adjusting bedclothes, sheets and blankets)?: A Little Difficulty moving from lying on back to sitting on the side of the bed? : Total Difficulty sitting down on and standing up from a chair with arms (e.g., wheelchair, bedside commode, etc,.)?: A Little Help needed moving to and from a bed to chair (including a wheelchair)?: A Little Help needed walking in hospital room?: A Little Help needed climbing 3-5 steps with a railing? : A Little 6 Click Score: 16    End of Session Equipment Utilized During Treatment: Gait belt Activity Tolerance: Patient tolerated treatment well Patient left: in bed;with call bell/phone within reach Nurse Communication: Mobility  status PT Visit Diagnosis: Muscle weakness (generalized) (M62.81);Difficulty in walking, not elsewhere classified (R26.2)     Time: 2411-4643 PT Time Calculation (min) (ACUTE ONLY): 31 min  Charges:  $Gait Training: 8-22 mins $Therapeutic Activity: 8-22 mins                    G Codes:       [name], PT [phone]   [place] 01/08/2017, 11:59 AM

## 2017-01-08 NOTE — Progress Notes (Signed)
Advanced Heart Failure Rounding Note   Subjective:    RHC on 6/1 as below showed relatively well compensated hemodynamics with mild RV failure on milrinone 0.125. Milrinone stopped and sildenafil started.   01/03/17 became hypotensive and nauseated. Co-ox in 60s. Sildenafil stopped and started back on milrinone.   Plan for going home today, however last night she felt SOB, abdominal tightening. Given 40mg  IV lasix. Co ox down to 46% today. Up and walking in the halls this am with PT, says she is breathing fine. She denies orthopnea and chest pain. She remained in NSR overnight.   RHC (6/1) Done on milrinone 0.11mcg/kg/min  RA = 10  RV = 40/15 PA = 42/15 (28) PCW = 16 Fick cardiac output/index = 6.1/3.3 Thermo CO/CI = 5.0/2.7 PVR = 2.4 FA sat = 96% PA sat = 63%. 64%  Objective:    Vital Signs:   Temp:  [97.5 F (36.4 C)-98.1 F (36.7 C)] 97.5 F (36.4 C) (06/07 0508) Pulse Rate:  [76] 76 (06/07 0508) Resp:  [18-20] 18 (06/07 0508) BP: (119-133)/(54-76) 119/54 (06/07 0508) SpO2:  [91 %-93 %] 91 % (06/07 0508) Weight:  [180 lb 11.2 oz (82 kg)] 180 lb 11.2 oz (82 kg) (06/07 0508) Last BM Date: 01/06/17  Weight change: Filed Weights   01/06/17 0431 01/07/17 0449 01/08/17 0508  Weight: 179 lb 1.6 oz (81.2 kg) 179 lb 4.8 oz (81.3 kg) 180 lb 11.2 oz (82 kg)   Intake/Output:   Intake/Output Summary (Last 24 hours) at 01/08/17 0758 Last data filed at 01/08/17 0500  Gross per 24 hour  Intake              360 ml  Output              850 ml  Net             -490 ml    Physical Exam: General:Female, walking in the hall with PT. No resp difficulty.  HEENT: normal Neck: Supple. 5-6 cm JVP. Carotids 2+ bilat; no bruits. No thyromegaly or nodule noted. Cor: PMI nondisplaced.Regular rate and rhythm. No M/G/R noted Lungs: Clear bilaterally. Normal effort.  Abdomen: soft, NT, ND no HSM. No bruits or masses.  Extremities: no cyanosis, clubbing, or rash. No peripheral edema.     Neuro: alert & orientedx3, cranial nerves grossly intact. moves all 4 extremities w/o difficulty. Affect flat.   Telemetry:NSR rates in the 70's. Personally reviewed   Labs: Basic Metabolic Panel:  Recent Labs Lab 01/04/17 0425 01/05/17 0455 01/06/17 0507 01/07/17 0428 01/08/17 0418  NA 135 135 136 137 135  K 4.3 3.9 4.0 4.3 3.9  CL 102 102 100* 101 98*  CO2 25 27 26 26 26   GLUCOSE 128* 130* 101* 129* 135*  BUN 62* 52* 46* 43* 43*  CREATININE 1.72* 1.28* 1.32* 1.33* 1.31*  CALCIUM 8.5* 8.7* 8.7* 9.0 8.9  MG  --  2.5*  --   --   --    CBC:  Recent Labs Lab 01/03/17 0501 01/04/17 0425 01/05/17 0455 01/06/17 0507 01/07/17 0428  WBC 7.4 8.5 6.7 6.1 7.5  HGB 8.8* 8.7* 8.5* 8.4* 9.1*  HCT 30.0* 28.8* 28.2* 27.8* 30.4*  MCV 89.0 88.1 87.6 87.4 87.4  PLT 270 251 243 257 300   BNP: BNP (last 3 results)  Recent Labs  11/27/16 0159 12/17/16 1255  BNP 920.0* 1,062.6*    Medications:     Scheduled Medications: . amiodarone  400 mg Oral BID  .  apixaban  5 mg Oral BID  . atorvastatin  40 mg Oral q1800  . Chlorhexidine Gluconate Cloth  6 each Topical Daily  . docusate sodium  200 mg Oral Daily  . feeding supplement (ENSURE ENLIVE)  237 mL Oral BID BM  . folic acid  1 mg Oral Daily  . furosemide  80 mg Oral Daily  . glipiZIDE  5 mg Oral Q breakfast  . insulin aspart  0-15 Units Subcutaneous TID WC  . polyethylene glycol  17 g Oral Daily  . sodium chloride flush  3 mL Intravenous Q12H    Infusions: . sodium chloride 250 mL (01/04/17 2030)  . milrinone 0.375 mcg/kg/min (01/08/17 0529)    PRN Medications: sodium chloride, acetaminophen, ALPRAZolam, ALPRAZolam, alum & mag hydroxide-simeth, bisacodyl **OR** bisacodyl, magnesium hydroxide, miconazole, ondansetron (ZOFRAN) IV, oxyCODONE, sodium chloride flush, sodium chloride flush, sodium phosphate, traMADol   Assessment/Plan/Discussion    1. Acute on chronic systolic HF -> Cardiogenic Shock - RHC 6/1 with  mild RV failure otherwise well compensated. - Milrinone stopped on 6/1 and developed recurrent shock  - Will need home milrinone. [facility] aware.  - TPY study without amyloid - No losartan/spiro with CK, BP soft and she was dizzy yesterday with walk with cardiac rehab.  - Felt SOB last night, improved with IV lasix. Will increase her lasix to 80mg  BID. 02 sats were 87% this morning with walking with PT.  2.  Left pleural effusion - s/p Thoracentesis 12/22/16 with 1.2 L out.  - CXR 5/23 with mild improvement.  - Repeat CXR with persistent L pleural effusion consistent with CHF.  - No change to current plan.  3. 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  Continue statin. Last ASA dose was 5/18, it was discontinued due to hematuria.  - Denies chest pain.  - No change to current plan. 4. PAF - Continue Amio 400mg  BID.  - Off heparin due to hematuria. - Continue Eliquis  4. Hematuria - CT ab negative.  - Received 1 UPRBC 12/21/16 with improvement of Hgb. (Total of 3 UPRBCs thus far) - Foley removed. No hematuria per patient. - Stable. Continue anticoagulation as above.  - tolerating enox for DVT prophylaxis - No change to current plan.  5. AKI: - Stable.  6. UTI: -  On bactrim 5/14 through 5/16. Switched to rocephin 5/16 . Now off. Urine CX + EColi  - Blood cultures negative - Denies dysuria.  - No change to current plan.  7. Symptomatic anemia - acute blood loss due to hematuria - Improved s/p transfusion 12/21/16   - Stable.   Arbutus Leas, NP 01/08/2017   Advanced Heart Failure Team Pager [phone] (M-F; [place])  Please contact [facility] for night-coverage after hours (4p -7a ) and weekends on [email or web address]   Patient seen and examined with [name], NP. We discussed all aspects of the encounter. I agree with the assessment and plan as stated above.   Symptomatically much improved. Walking halls. Maintaining NSR on po amio. No bleeding on Eliquis.  Weight climbing. Co-ox down but may be inaccurate. Will repeat co-ox. If 55% or greater will plan home today on milrinone 0.375. Increase lasix to 80/40  If goes home today. Cardiac meds:  Amiodarone 200 bid Eliquis 5 bid Milrinone 0.375 mcg/kg/min Atorva 40 daily  Lasix 80 am 40 pm Spiro 12.5 daily  No b-blocker with low output.   Glori Bickers, MD  8:25 AM

## 2017-01-08 NOTE — Progress Notes (Signed)
SATURATION QUALIFICATIONS: (This note is used to comply with regulatory documentation for home oxygen)  Patient Saturations on Room Air at Rest = 95%  Patient Saturations on Room Air while Ambulating = 87%  Patient Saturations on 2 Liters of oxygen while Ambulating = 96%  Please briefly explain why patient needs home oxygen: yes pt needs oxygen, pt desats while ambulating

## 2017-01-08 NOTE — Care Management Note (Signed)
Case Management Note Previous CM note initiated by Bethena Roys, RN 12/02/2016, 2:50 PM   Patient Details  Name: [name] MRN: [id number] Date of Birth: 02-05-1946  Subjective/Objective: Pt presented for chest pain - post cardiac cath 11-28-16 revealled 3 vessel CAD- Plan for CABG on Thursday. Pt is from home with daughter and grandson.             Action/Plan: CM will continue to monitor for disposition needs post d/c.   Expected Discharge Date:  01/08/17               Expected Discharge Plan:  [place]  In-House Referral:  Clinical Social Work  Discharge planning Services  CM Consult  Post Acute Care Choice:  Durable Medical Equipment, Home Health Choice offered to:  Patient  DME Arranged:  3-N-1, Oxygen DME Agency:  [place]:  RN [facility] Agency:  [place]  Status of Service:  Completed, signed off  If discussed at [place] of Stay Meetings, dates discussed:  5/15, 5/17, 5/22, 5/24, 6/5  Discharge Disposition: home with home health   Additional Comments:  01/08/17- 1420- Randol Zumstein RN, CM- pt ready for d/c home today- will need home 02- order has been placed and qualifying note in epic- notified Santiago Glad with [facility] for DME need- portable tank to be delivered to room prior to discharge- 3n1 has already been delivered- Pam with [facility] to come hook up home Milrinone-   01/07/17- 0900- Marvetta Gibbons RN, CM- received msg from [place] with HF team- pt may be ready for d/c today. Spoke with pt at bedside to confirm what she wanted to do SNF vs home with [facility]- pt states she has considered both options but has decided she wants to go home with [facility] services- reviewed with pt co-pay cost for [facility] services- pt does not feel like she will need HH therapy and just wants to do the [facility] for IV milrinone needs- with plan to do outpt cardiac rehab once cleared by MD. Returned called to Junie Panning - NP - who will place [facility] orders and DME  for 3n1- have been in touch with Pam at [facility] who will see pt this afternoon for home IV milrinone needs prior to discharge- notified Santiago Glad with [facility] for DME needs 3n1 to be delivered to room prior to discharge.   Update- Notified that pt has transportation issue for today's discharge- have spoken with pt - daughter has had EGD/Colonoscopy today and grandson works evening shift- no else to come provide transportation this afternoon- pt going home on home IV milrinone- can not go via taxi- will plan for d/c tomorrow- have spoken with Pam at [facility] who will plan to come in AM to hook pt up to home IV milrinone pump.    01/06/17- 1000- Jasdeep Dejarnett RN, CM- pt continues with afib and HF- IV amio and IV milrinone- plan for TEE cardioversion on 6/6- plan will be to go home on IV milrinone- [facility] has been contacted for home IV milrinone needs- spoke with pt at bedside choice offered for [facility]- ok with using [facility] for [facility] needs- per conversation with pt she does not feel like she wants HHPT/OT- and will plan to do outpt cardiac rehab- will need orders for [facility] for IV needs. Pt also reports that she has RW at home already but does want a 3n1 for home- have requested DME order for d/c. - referral received  for Eliquis- per insurance check- copay cost $45- pt given 30 day free card- to use on d/c-  Have spoken with both Pam and Santiago Glad with [facility] who are following for [facility] needs referral has been given to [facility]- CM to continue to follow  12/26/16- 1600- Keiyana Stehr rN, CM- pt has returned to 2W- progressing well- may be able to go home with [facility] vs SNF- CM to continue to follow  12/18/16- 1020- Cj Edgell RN, CM- pt with decline yesterday tx back to ICU-2H for further care-with IV norepi and milrinone started. Also diuresed with IV lasix and IV amio for Afib. CM and CSW to continue to follow for d/c needs.   12/12/16- 1000- Cozy Veale RN, CM- pt tx from [facility] to 2W on 5/9- CSW following for SNF placement- pt with post op afib-    12/09/16- 1125-  Valentina Gu, CM-  Pt s/p CABGx4 on 5/3-  Remains on 2H post op  Dawayne Patricia, RN 01/08/2017, 2:22 PM [phone]

## 2017-01-08 NOTE — Progress Notes (Signed)
Pt sitting on EOB, smiling, excited to go home. Reviewed ed, pt able to still perform teach back. CRPII referral will be sent to [place], ACSM 2:07 PM 01/08/2017

## 2017-01-09 ENCOUNTER — Telehealth: Payer: Self-pay

## 2017-01-09 ENCOUNTER — Other Ambulatory Visit: Payer: Self-pay

## 2017-01-09 DIAGNOSIS — Z48812 Encounter for surgical aftercare following surgery on the circulatory system: Secondary | ICD-10-CM

## 2017-01-09 DIAGNOSIS — Z9981 Dependence on supplemental oxygen: Secondary | ICD-10-CM | POA: Diagnosis not present

## 2017-01-09 DIAGNOSIS — I2510 Atherosclerotic heart disease of native coronary artery without angina pectoris: Secondary | ICD-10-CM

## 2017-01-09 DIAGNOSIS — I509 Heart failure, unspecified: Secondary | ICD-10-CM

## 2017-01-09 DIAGNOSIS — Z7984 Long term (current) use of oral hypoglycemic drugs: Secondary | ICD-10-CM | POA: Diagnosis not present

## 2017-01-09 DIAGNOSIS — Z951 Presence of aortocoronary bypass graft: Secondary | ICD-10-CM

## 2017-01-09 NOTE — Telephone Encounter (Addendum)
[facility] Post-discharge Follow-Up Phone Call:  Date of Discharge: 01/08/2017 Principal Discharge Diagnosis(es): acute on chronic diastolic CHF, Atrial fibrillation with RVR, DM Post-discharge Communication: (Clearly document all attempts clearly and date contact made) call placed to the patient. Her daughter answered the phone and put the phone on speaker with the patient present. Patient confirmed her birth date and completed the call. Call Completed: Yes                    With Whom: Patient Interpreter Needed: No    Please check all that apply:  X  Patient is knowledgeable of his/her condition(s) and/or treatment. ? Patient is caring for self at home.  X  Patient is receiving assist at home from family and/or caregiver. Family and/or caregiver is knowledgeable of patient's condition(s) and/or treatment. - her daughter and grandson live with her and provide needed assistance.  She also noted how glad she is to be home from the hospital. Today she said she had company from her aunt and uncle.  She reported that she is very pleased to have received the information about free food pantries in [place] and has regularly obtained food from them.  X  Patient is receiving home health services. If so, name of agency. - [facility].  She was discharged home with milrinone and is expecting the home health nurse to contact her and she received a message from the nurse while on the phone with this CM.      Medication Reconciliation:  ? Medication list reviewed with patient. X  Patient obtained all discharge medications. If not, why? - She is a retired Therapist, sports and said that she has all of her medications and has  reviewed her medication list and did not report any need to review with this CM.  She did report that she knows she is starting amiodarone, eliquis, atorvastatin, folvite, methylprednisolone. She said that she did not have the prescription for oxycodone filled and does not want to have it  filled. She also noted that she is aware that her lasix has now increased to 80 mg in the am and 40 mg in the pm. She stated that she understands that she is to stop taking the xarelto and acetaminophen . She said that she will speak to the doctor about the acetaminophen because she was taking it in the hospital.   She said that she has enough milrinone for about a week and knows that nurse will be coming to her home to instruct her how to change the milrinone.  She has a glucometer but has not checked her blood sugar since coming home.  She also is waiting for her pulse oximeter and set for telemonitoring. She noted that she has a scale and her weigh today was 180 lbs,the same as yesterday.   She said that she understands that she is to adhere to a 203ml /day fluid restriction.   Activities of Daily Living:  ? Independent X  Needs assist (describe; ? home DME used) - O2 delivered from [facility]. She said that she is using the O2 @2L  most of the time, but occasionally feels that she does not need it - for instance when eating.  ? [facility] (describe, ? home DME used)   Community resources in place for patient:  ? None  X  Home Health/Home DME - [facility] - milrinone infusion, home health nurse.  ? Assisted Living ? Support Group  Questions/Concerns discussed: No other questions/concerns reported at this time. She confirmed her appointment with the TCC at [facility] for 01/12/17 @ 0945 and stated that her grandson will transport her to the clinic.

## 2017-01-09 NOTE — Patient Outreach (Signed)
[facility]) Care Management  01/09/17  [name] 11-Sep-1945 [id number]  Successful outreach completed with patient. Patient identification verified. RNCM introduced self and [facility] Care Management and patient remembered talking with hospital liaison in the hospital. However, she stated that right now she is overwhelmed with all the appointments and home health coming out that she currently declines [facility] services. RNCM explained role and how [facility] could assist her and she stated that she feels she "doesn't need any additional help at this time." She stated that she already has home health. RNCM explained differences between home health and [facility] and that [facility] would not replace the services of home health. Also educated that is a free program. She stated that she has all of her medicines, home health is coming out today to assist her with her IV milrinone and she has family support. She stated that she just as so much going on and would prefer not to have RNCM outreach to her at this time.  RNCM educated patient that if she should change her mind for any reason, she could call and request for outreach again and provided contact information.   [name], RN, BSN, [place] Management Coordinator [phone]

## 2017-01-12 ENCOUNTER — Ambulatory Visit (HOSPITAL_COMMUNITY)
Admit: 2017-01-12 | Discharge: 2017-01-12 | Disposition: A | Discharge status: Home / Self Care | Payer: PPO | Source: Ambulatory Visit | Attending: Internal Medicine | Admitting: Internal Medicine

## 2017-01-12 ENCOUNTER — Inpatient Hospital Stay: Payer: PPO | Admitting: Family Medicine

## 2017-01-12 ENCOUNTER — Encounter (HOSPITAL_COMMUNITY): Payer: Self-pay | Admitting: Internal Medicine

## 2017-01-12 ENCOUNTER — Telehealth: Payer: Self-pay

## 2017-01-12 ENCOUNTER — Other Ambulatory Visit: Payer: Self-pay | Admitting: Thoracic Surgery (Cardiothoracic Vascular Surgery)

## 2017-01-12 VITALS — BP 132/70 | HR 73 | Wt 180.5 lb

## 2017-01-12 DIAGNOSIS — I2510 Atherosclerotic heart disease of native coronary artery without angina pectoris: Secondary | ICD-10-CM | POA: Diagnosis not present

## 2017-01-12 DIAGNOSIS — I480 Paroxysmal atrial fibrillation: Secondary | ICD-10-CM

## 2017-01-12 DIAGNOSIS — Z95810 Presence of automatic (implantable) cardiac defibrillator: Secondary | ICD-10-CM | POA: Insufficient documentation

## 2017-01-12 DIAGNOSIS — Z951 Presence of aortocoronary bypass graft: Secondary | ICD-10-CM

## 2017-01-12 DIAGNOSIS — I4901 Ventricular fibrillation: Secondary | ICD-10-CM | POA: Diagnosis not present

## 2017-01-12 DIAGNOSIS — Z833 Family history of diabetes mellitus: Secondary | ICD-10-CM | POA: Diagnosis not present

## 2017-01-12 DIAGNOSIS — Z7984 Long term (current) use of oral hypoglycemic drugs: Secondary | ICD-10-CM | POA: Diagnosis not present

## 2017-01-12 DIAGNOSIS — Z803 Family history of malignant neoplasm of breast: Secondary | ICD-10-CM | POA: Insufficient documentation

## 2017-01-12 DIAGNOSIS — Z8249 Family history of ischemic heart disease and other diseases of the circulatory system: Secondary | ICD-10-CM | POA: Insufficient documentation

## 2017-01-12 DIAGNOSIS — E119 Type 2 diabetes mellitus without complications: Secondary | ICD-10-CM | POA: Insufficient documentation

## 2017-01-12 DIAGNOSIS — I252 Old myocardial infarction: Secondary | ICD-10-CM | POA: Diagnosis not present

## 2017-01-12 DIAGNOSIS — I5042 Chronic combined systolic (congestive) and diastolic (congestive) heart failure: Secondary | ICD-10-CM | POA: Insufficient documentation

## 2017-01-12 DIAGNOSIS — Z823 Family history of stroke: Secondary | ICD-10-CM | POA: Diagnosis not present

## 2017-01-12 DIAGNOSIS — R319 Hematuria, unspecified: Secondary | ICD-10-CM | POA: Diagnosis not present

## 2017-01-12 DIAGNOSIS — I5022 Chronic systolic (congestive) heart failure: Secondary | ICD-10-CM

## 2017-01-12 DIAGNOSIS — Z87891 Personal history of nicotine dependence: Secondary | ICD-10-CM | POA: Diagnosis not present

## 2017-01-12 DIAGNOSIS — Z7901 Long term (current) use of anticoagulants: Secondary | ICD-10-CM | POA: Diagnosis not present

## 2017-01-12 DIAGNOSIS — Z79899 Other long term (current) drug therapy: Secondary | ICD-10-CM | POA: Diagnosis not present

## 2017-01-12 DIAGNOSIS — I422 Other hypertrophic cardiomyopathy: Secondary | ICD-10-CM | POA: Insufficient documentation

## 2017-01-12 MED ORDER — DIGOXIN 125 MCG PO TABS: 0.1250 mg | ORAL_TABLET | Freq: Every day | ORAL | 3 refills | Status: DC

## 2017-01-12 MED ORDER — FUROSEMIDE 40 MG PO TABS: 80.0000 mg | ORAL_TABLET | Freq: Two times a day (BID) | ORAL | 3 refills | Status: DC

## 2017-01-12 MED ORDER — MILRINONE LACTATE IN DEXTROSE 20-5 MG/100ML-% IV SOLN: 0.2500 ug/kg/min | INTRAVENOUS | 10 refills | Status: DC

## 2017-01-12 MED FILL — DIGITEK 125 MCG TABLET: 125 | 30 days supply | Qty: 30 | Fill #0

## 2017-01-12 NOTE — Patient Instructions (Signed)
START Digoxin 0.125 mg (1 Tablet) Once Daily  INCREASE Lasix to 80 mg (2 Tablets) Two times Daily  Reduce Milrinone to 0.25 mcg ([facility] will do this for you.)  Labs next week (BMET)  Follow up with [place] on Thursday by calling [phone] and press Option 5 for the triage nurse.   Follow up in Clinic in 2 weeks.

## 2017-01-12 NOTE — Progress Notes (Signed)
Patient ID: [name], female   DOB: 18-Aug-1945, 71 y.o.   MRN: [id number]    Advanced Heart Failure Clinic Note    Primary HF Cardiologist: Dr Haroldine Laws EP: Dr Lovena Le.  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 Jude ICD 04/2015, & chronic diastolic CHF (EF 84-69% by echo, 45-50% by cath).   Admitted 09/17/15 with new rapid aflutter (previously PAF/VT) with plans for cardioversion. She was also noted to be volume overload in setting of non-compliance (thought to be dietary). Had TEE/DC-CV with return to NSR. She developed laryngospasm and required brief intubation but recovered in PACU post procedure. Enrolled in the ReDS vest trial,  Reading 53%. She continued to diurese with IV lasix and transitioned to lasix 40 mg twice a day. On the day of discharge Reds Vest reading was 39. Discharge weight was 160 pounds.   Admitted 11/27/16-01/08/17 with VF shocked appropriately by ICD. Underwent cath that showed severe 3 vessel disease She underwent CABG x 4, MAZE, and LAA clip. Post operatively developed AF with RVR and low output heart failure, initial co ox was 36%.Converted to NSR on amio. She was started on milrinone but unable to wean off successfully due to recurrent low output HF. Echo showed EF 45-50% with mild RV hypokinesis.  RHC performed on milrinone which showed primarily RV failure. Suspicion for amyloid raised. SPEP negative. Technetium pyrophosphate scan negative for TTR amyloid. Sent home on milrinone 0.375 mcg. Also had e. Coli UTI and treated with abx,   Returns today for post-hospital f/u. Here with her sister. Says she feels great. Able to do al ADLs without too much difficulty. Denies orthopnea or PND. No CP or palpitations. Weight stable. Mild LE edema. No problems with milrinone  infusion,.   [place] 01/02/17  Done on milrinone 0.163mcg/kg/min  RA = 10  RV = 40/15 PA = 42/15 (28) PCW = 16 Fick cardiac output/index = 6.1/3.3 Thermo CO/CI = 5.0/2.7 PVR = 2.4 FA sat = 96% PA sat = 63%. 64%   09/2015 TEE EF 35-40% No evidence of thrombus  09/18/2015 ECHO EF 45-50%   Labs 09/24/2015: K 4.1 Creatinine 1.33    ROS: All systems negative except as listed in HPI, PMH and Problem List.  SH:  Social History   Social History  . Marital status: Divorced    Spouse name: N/A  . Number of children: N/A  . Years of education: N/A   Occupational History  . Not on file.   Social History Main Topics  . Smoking status: Former Smoker    Years: 4.00    Quit date: 12/31/1980  . Smokeless tobacco: Never Used  . Alcohol use No  . Drug use: No  . Sexual activity: Not Currently   Other Topics Concern  . Not on file   Social History Narrative   Divorced.  Lives with daughter and grandson. 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    FH:  Family History  Problem Relation Age of Onset  . Breast cancer Mother   . Diabetes Father   . Stroke Father   . Heart disease Sister   . Healthy Sister     Past Medical History:  Diagnosis Date  . Abnormal PFT   . CAD (coronary artery disease), native coronary artery - 3  vessel 04/21/2015   a. NSTEMI 8-04/2015 felt 2/2 demand ischemia. b. 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  . Chronic diastolic CHF (congestive heart failure) ([place])   . Diabetes mellitus ([place])   . Diabetes mellitus without complication ([place])    dx on wed  . Hypertrophic cardiomyopathy ([place])   . Left atrial enlargement   . Mitral regurgitation    a. Echo 04/2015: moderate mitral regurgitation. b. F/u echo 05/2015: mild MR.  . NSTEMI (non-ST elevated myocardial infarction) ([place]) 11/27/2016  . Paroxysmal atrial fibrillation ([name])   . Shortness of breath dyspnea   . Tricuspid regurgitation    a. Echo  04/2015: Mod-severe TR. b. Not mentioned on echo 05/2015  . Ventricular tachycardia ([place])    a. s/p St. Jude ICD implanted 04/2015.    Current Outpatient Prescriptions  Medication Sig Dispense Refill  . amiodarone (PACERONE) 200 MG tablet Take 1 tablet (200 mg total) by mouth 2 (two) times daily. 60 tablet 1  . apixaban (ELIQUIS) 5 MG TABS tablet Take 1 tablet (5 mg total) by mouth 2 (two) times daily. 60 tablet 1  . atorvastatin (LIPITOR) 40 MG tablet Take 1 tablet (40 mg total) by mouth daily at 6 PM. 30 tablet 1  . Blood Glucose Monitoring Suppl (TRUE METRIX METER) DEVI 1 each by Does not apply route 2 (two) times daily. 1 Device 0  . folic acid (FOLVITE) 1 MG tablet Take 1 tablet (1 mg total) by mouth daily. 30 tablet 1  . furosemide (LASIX) 40 MG tablet 80 mg po q am and 40 mg po  q pm 90 tablet 1  . glipiZIDE (GLUCOTROL) 5 MG tablet Take 1 tablet (5 mg total) by mouth daily. 90 tablet 1  . glucose blood (TRUE METRIX BLOOD GLUCOSE TEST) test strip Use as instructed twice daily 100 each 12  . methylPREDNISolone (MEDROL DOSEPAK) 4 MG TBPK tablet As directed for six day taper 21 tablet 0  . milrinone (PRIMACOR) 20 MG/100 ML SOLN infusion Inject 30 mcg/min into the vein continuous. 100 mL 10  . oxyCODONE (OXY IR/ROXICODONE) 5 MG immediate release tablet Take 1-2 tablets (5-10 mg total) by mouth every 6 (six) hours as needed for severe pain. 30 tablet 0  . TRUEPLUS LANCETS 28G MISC Use as instructed twice daily. 60 each 5   No current facility-administered medications for this encounter.     Vitals:   01/12/17 1552  BP: 132/70  Pulse: 73  SpO2: 98%  Weight: 180 lb 8 oz (81.9 kg)   Wt Readings from Last 3 Encounters:  01/12/17 180 lb 8 oz (81.9 kg)  01/08/17 180 lb 11.2 oz (82 kg)  06/16/16 173 lb 9.6 oz (78.7 kg)    General:  Well appearing. No resp difficulty HEENT: normal Neck: supple. JVP 10. Carotids 2+ bilat; no bruits. No lymphadenopathy or thryomegaly appreciated. Cor: PMI  nondisplaced. Sternotomy scar ok. Regular rate & rhythm. 2/6 TR no s3. Lungs: clear Abdomen: soft, nontender, nondistended. No hepatosplenomegaly. No bruits or masses. Good bowel sounds. Extremities: no cyanosis, clubbing, rash, 2+ edema Neuro: alert & orientedx3, cranial nerves grossly intact. moves all 4 extremities w/o difficulty. Affect pleasant   ASSESSMENT & PLAN: 1. 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 EF 45-50% post CABG 5/18 - She is now s/p VF arrest and CABG in 4/18. Prolonged post-hospital course with low output HF despite relatively preserved EF. RHC suggestive of primarily RV failure. Now on  milrinone 0.375 - Technetium pyrophosphate scan negative for amyloid. - Currently doing well. Will wean milrinone to 0.25.  - Increase lasix to 80 po bid - Add digoxin 0.125 2. PAF - S/P DC-CV 09/20/15. - had recurrent AF post-CABG. Back in NSR on amio. Will continue - Continue Eliquis 3. CAD - 3V CAD s/p CABG. Maze and LAA clipping 4/18 - Continue statin. No ASA with Eliquis. 4. H/O VT with ICD placed 04/2015. - s/p VF arrest 4/18 with ICD shock  5. DM2  7. H/o orthostatic hypotension - previously on midodrine  Follow up in 1 month.    Dani Gobble Smith,MD 4:20 PM  Patient seen and examined with [name], NP. We discussed all aspects of the encounter. I agree with the assessment and plan as stated above.   She is much improved with milrinone support. That said still has significant right heart failure on exam despite nearly normal echo. Clinical concern for amyloid persists by SPEP negative and technetium pyrophosphate scan negative as well. Will wean milrinone to 0.25. Increase lasix to 80 bid. Add digoxin. Will follow closely.  Maintaining NSR. Continue amio and Eliquis. Hematuria has not resolved.   Glori Bickers, MD  11:21 PM

## 2017-01-12 NOTE — Telephone Encounter (Signed)
Call received from the patient. She said that she needs to cancel her appointment for this morning.  She noted that she has an appointment with cardiology today and tomorrow and has 2 deliveries from [facility] scheduled for this week and she is overwhelmed with the appointments. She reported that she is feeling " very good" and can't believe how much better she feels. Her appointment was rescheduled for 01/19/17 @ 0945.

## 2017-01-13 ENCOUNTER — Ambulatory Visit (INDEPENDENT_AMBULATORY_CARE_PROVIDER_SITE_OTHER): Payer: Self-pay | Admitting: Thoracic Surgery (Cardiothoracic Vascular Surgery)

## 2017-01-13 ENCOUNTER — Ambulatory Visit
Admission: RE | Admit: 2017-01-13 | Discharge: 2017-01-13 | Disposition: A | Discharge status: Home / Self Care | Payer: PPO | Source: Ambulatory Visit | Attending: Thoracic Surgery (Cardiothoracic Vascular Surgery) | Admitting: Thoracic Surgery (Cardiothoracic Vascular Surgery)

## 2017-01-13 ENCOUNTER — Encounter: Payer: Self-pay | Admitting: Thoracic Surgery (Cardiothoracic Vascular Surgery)

## 2017-01-13 VITALS — BP 140/80 | HR 67 | Resp 16 | Ht 64.0 in | Wt 174.0 lb

## 2017-01-13 DIAGNOSIS — I2510 Atherosclerotic heart disease of native coronary artery without angina pectoris: Secondary | ICD-10-CM | POA: Diagnosis not present

## 2017-01-13 DIAGNOSIS — Z7984 Long term (current) use of oral hypoglycemic drugs: Secondary | ICD-10-CM | POA: Diagnosis not present

## 2017-01-13 DIAGNOSIS — Z951 Presence of aortocoronary bypass graft: Secondary | ICD-10-CM

## 2017-01-13 DIAGNOSIS — I509 Heart failure, unspecified: Secondary | ICD-10-CM | POA: Diagnosis not present

## 2017-01-13 DIAGNOSIS — Z9981 Dependence on supplemental oxygen: Secondary | ICD-10-CM | POA: Diagnosis not present

## 2017-01-13 DIAGNOSIS — J90 Pleural effusion, not elsewhere classified: Secondary | ICD-10-CM | POA: Diagnosis not present

## 2017-01-13 DIAGNOSIS — Z48812 Encounter for surgical aftercare following surgery on the circulatory system: Secondary | ICD-10-CM | POA: Diagnosis not present

## 2017-01-13 NOTE — Progress Notes (Signed)
[address]       , [id number]             [phone]    HPI: [name] returns for a scheduled follow-up visit.  She is a 71 year old woman with known coronary disease and ICD in place who presented after having multiple VT/VF arrests. She had CABG 5, maze and a left atrial clip on 12/04/2016. Early postoperative course was unremarkable but she then developed atrial fibrillation with rapid ventricular response and worsening right heart failure. She was placed back on milrinone at 0.375 mcg/kg/min. She was started on anticoagulation and had problems with hematuria. She had an Escherichia coli urinary tract infection which was treated with antibiotics. Her hematuria multiple resolved and she was placed back on a NOAC prior to discharge  Since leaving the hospital she says that she has been doing better. She saw Dr. Haroldine Laws yesterday. He will refer her milrinone to be decreased to 0.25, but [facility] has not done that yet. He also increased her Lasix to 80 mg twice a day and she has lost 6 pounds in 1 day. She is not having incisional pain. Overall she feels much better than when she left the hospital about a week ago. Past Medical History:  Diagnosis Date  . Abnormal PFT   . CAD (coronary artery disease), native coronary artery - 3 vessel 04/21/2015   a. NSTEMI 8-04/2015 felt 2/2 demand ischemia. b. 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  . Chronic diastolic CHF (congestive heart failure) ([place])   . Diabetes mellitus ([place])   . Diabetes mellitus without complication ([place])    dx on wed  . Hypertrophic cardiomyopathy ([place])   . Left atrial enlargement   . Mitral regurgitation    a. Echo 04/2015: moderate mitral regurgitation. b. F/u echo 05/2015: mild MR.  . NSTEMI (non-ST elevated myocardial infarction) ([place]) 11/27/2016  . Paroxysmal atrial fibrillation ([name])   . Shortness of breath dyspnea   .  Tricuspid regurgitation    a. Echo 04/2015: Mod-severe TR. b. Not mentioned on echo 05/2015  . Ventricular tachycardia ([place])    a. s/p St. Jude ICD implanted 04/2015.    Current Outpatient Prescriptions  Medication Sig Dispense Refill  . amiodarone (PACERONE) 200 MG tablet Take 1 tablet (200 mg total) by mouth 2 (two) times daily. 60 tablet 1  . apixaban (ELIQUIS) 5 MG TABS tablet Take 1 tablet (5 mg total) by mouth 2 (two) times daily. 60 tablet 1  . atorvastatin (LIPITOR) 40 MG tablet Take 1 tablet (40 mg total) by mouth daily at 6 PM. 30 tablet 1  . Blood Glucose Monitoring Suppl (TRUE METRIX METER) DEVI 1 each by Does not apply route 2 (two) times daily. 1 Device 0  . digoxin (LANOXIN) 0.125 MG tablet Take 1 tablet (0.125 mg total) by mouth daily. 30 tablet 3  . folic acid (FOLVITE) 1 MG tablet Take 1 tablet (1 mg total) by mouth daily. 30 tablet 1  . furosemide (LASIX) 40 MG tablet Take 2 tablets (80 mg total) by mouth 2 (two) times daily. 120 tablet 3  . glipiZIDE (GLUCOTROL) 5 MG tablet Take 1 tablet (5 mg total) by mouth daily. 90 tablet 1  . glucose blood (TRUE METRIX BLOOD GLUCOSE TEST) test strip Use as instructed twice daily 100 each 12  . methylPREDNISolone (MEDROL DOSEPAK) 4 MG TBPK tablet As directed for six  day taper 21 tablet 0  . milrinone (PRIMACOR) 20 MG/100 ML SOLN infusion Inject 20 mcg/min into the vein continuous. 100 mL 10  . TRUEPLUS LANCETS 28G MISC Use as instructed twice daily. 60 each 5   No current facility-administered medications for this visit.     Physical Exam BP 140/80 (BP Location: Left Arm, Patient Position: Sitting, Cuff Size: Large)   Pulse 67   Resp 16   Ht 5\' 4"  (1.626 m)   Wt 174 lb (78.9 kg)   SpO2 94% Comment: RA  BMI 29.90 kg/m  71 year old woman in no acute distress Alert and oriented 3 with no focal deficits Cardiac regular rate and rhythm normal S1 and S2 Lungs diminished at left base, otherwise clear Sternum stable, incision  healing well Trace edema feet and ankles  Diagnostic Tests: CHEST  2 VIEW  COMPARISON:  01/07/2017 chest radiograph.  Findings:  Right PICC terminates over the cavoatrial junction. Intact sternotomy wires. Two lead left subclavian ICD is stable in configuration. CABG clips and atrial appendage clip overlie the left mediastinum. Stable cardiomediastinal silhouette with mild cardiomegaly and aortic atherosclerosis. No pneumothorax. No right pleural effusion. Small to moderate left pleural effusion appears stable. Stable mild pulmonary edema. Mild-to-moderate left lung base atelectasis is mildly decreased.  IMPRESSION: 1. Stable mild congestive heart failure . 2. Stable small to moderate left pleural effusion. 3. Mildly decreased left lung base atelectasis.   Electronically Signed   By: Ilona Sorrel M.D.   On: 01/13/2017 15:25 I personally reviewed the chest x-ray confirmed the findings above although I do think the left effusion is smaller.  Impression: [name] is a 71 year old woman who had coronary bypass grafting 5, Maze procedure, and the left atrial appendage clip about a month ago. She had a long and arduous postoperative course with refractory right heart failure and had to be discharged home on IV milrinone. She looks amazingly good today is improved dramatically even since her discharge about a week ago.  She is not having any incisional pain.  Dr. Haroldine Laws amount is managing her pressors and has decreased her milrinone dose, started digoxin, and increased her diuretics. She has had a marked response to the increase in Lasix.  She does have a small-to-moderate left pleural effusion. I think this actually looks better than it did on her last chest x-ray prior to discharge. She is on a blood thinner so I would prefer not to do thoracentesis if possible. She was responding to Lasix and hopefully that will resolve.  Hematuria- in setting of UTI and NOAC.  Resolved   Plan: Follow-up with Dr. Haroldine Laws as scheduled  Return in 3 weeks with PA and lateral chest x-ray  Melrose Nakayama, MD [facility] [phone]

## 2017-01-14 ENCOUNTER — Telehealth (HOSPITAL_COMMUNITY): Payer: Self-pay

## 2017-01-14 DIAGNOSIS — Z7984 Long term (current) use of oral hypoglycemic drugs: Secondary | ICD-10-CM | POA: Diagnosis not present

## 2017-01-14 DIAGNOSIS — L8990 Pressure ulcer of unspecified site, unspecified stage: Secondary | ICD-10-CM | POA: Diagnosis not present

## 2017-01-14 DIAGNOSIS — Z9981 Dependence on supplemental oxygen: Secondary | ICD-10-CM | POA: Diagnosis not present

## 2017-01-14 DIAGNOSIS — I2510 Atherosclerotic heart disease of native coronary artery without angina pectoris: Secondary | ICD-10-CM | POA: Diagnosis not present

## 2017-01-14 DIAGNOSIS — I5033 Acute on chronic diastolic (congestive) heart failure: Secondary | ICD-10-CM | POA: Diagnosis not present

## 2017-01-14 DIAGNOSIS — Z951 Presence of aortocoronary bypass graft: Secondary | ICD-10-CM | POA: Diagnosis not present

## 2017-01-14 DIAGNOSIS — Z48812 Encounter for surgical aftercare following surgery on the circulatory system: Secondary | ICD-10-CM | POA: Diagnosis not present

## 2017-01-14 NOTE — Telephone Encounter (Signed)
Patient insurance is active and benefits verified. Patient has [employer] - $15.00 co-pay, no deductible, out of pocket $3400/$0 has been, no co-insurance, no pre-authorization and no limit on visit. Spoke w/Laura @ [employer] - reference [id number].

## 2017-01-15 ENCOUNTER — Telehealth (HOSPITAL_COMMUNITY): Payer: Self-pay | Admitting: *Deleted

## 2017-01-15 NOTE — Telephone Encounter (Signed)
[facility] RN called saying that patient started digoxin yesterday morning and about 1 hour after taking it she started feeling very dizzy, lightheaded, nauseas, and numbness around her lips/mouth.  She held the medication this morning and has felt better today.   I spoke with [name], NP and she advises patient to stop medication. [facility] RN is aware and will advise patient to not take anymore.  Medication list has been updated.

## 2017-01-16 ENCOUNTER — Inpatient Hospital Stay (HOSPITAL_COMMUNITY): Payer: PPO

## 2017-01-19 ENCOUNTER — Inpatient Hospital Stay: Payer: PPO | Admitting: Family Medicine

## 2017-01-19 ENCOUNTER — Ambulatory Visit (HOSPITAL_COMMUNITY)
Admission: RE | Admit: 2017-01-19 | Discharge: 2017-01-19 | Disposition: A | Discharge status: Home / Self Care | Payer: PPO | Source: Ambulatory Visit | Attending: Cardiology | Admitting: Cardiology

## 2017-01-19 DIAGNOSIS — I5022 Chronic systolic (congestive) heart failure: Secondary | ICD-10-CM | POA: Diagnosis not present

## 2017-01-19 LAB — BASIC METABOLIC PANEL
ANION GAP: 8 (ref 5–15)
BUN: 37 mg/dL — ABNORMAL HIGH (ref 6–20)
CALCIUM: 9 mg/dL (ref 8.9–10.3)
CO2: 27 mmol/L (ref 22–32)
Chloride: 105 mmol/L (ref 101–111)
Creatinine, Ser: 1.5 mg/dL — ABNORMAL HIGH (ref 0.44–1.00)
GFR, EST AFRICAN AMERICAN: 40 mL/min — AB (ref >60)
GFR, EST NON AFRICAN AMERICAN: 34 mL/min — AB (ref >60)
Glucose, Bld: 148 mg/dL — ABNORMAL HIGH (ref 65–99)
POTASSIUM: 4.3 mmol/L (ref 3.5–5.1)
Sodium: 140 mmol/L (ref 135–145)

## 2017-01-20 ENCOUNTER — Telehealth: Payer: Self-pay | Admitting: Family Medicine

## 2017-01-20 DIAGNOSIS — Z9981 Dependence on supplemental oxygen: Secondary | ICD-10-CM | POA: Diagnosis not present

## 2017-01-20 DIAGNOSIS — Z7984 Long term (current) use of oral hypoglycemic drugs: Secondary | ICD-10-CM | POA: Diagnosis not present

## 2017-01-20 DIAGNOSIS — Z951 Presence of aortocoronary bypass graft: Secondary | ICD-10-CM | POA: Diagnosis not present

## 2017-01-20 DIAGNOSIS — Z48812 Encounter for surgical aftercare following surgery on the circulatory system: Secondary | ICD-10-CM | POA: Diagnosis not present

## 2017-01-20 DIAGNOSIS — I2510 Atherosclerotic heart disease of native coronary artery without angina pectoris: Secondary | ICD-10-CM | POA: Diagnosis not present

## 2017-01-20 NOTE — Telephone Encounter (Signed)
Call placed to [phone] and voice message was left for patient to return my call in order to reschedule her TCC appt (appt note: TCC - s/p CABG) with Dr. Jarold Song for next week, and to check on her status.   If patient calls back, please schedule her (TCC) appt.

## 2017-01-21 DIAGNOSIS — Z951 Presence of aortocoronary bypass graft: Secondary | ICD-10-CM | POA: Diagnosis not present

## 2017-01-21 DIAGNOSIS — Z7984 Long term (current) use of oral hypoglycemic drugs: Secondary | ICD-10-CM | POA: Diagnosis not present

## 2017-01-21 DIAGNOSIS — Z48812 Encounter for surgical aftercare following surgery on the circulatory system: Secondary | ICD-10-CM | POA: Diagnosis not present

## 2017-01-21 DIAGNOSIS — I5033 Acute on chronic diastolic (congestive) heart failure: Secondary | ICD-10-CM | POA: Diagnosis not present

## 2017-01-21 DIAGNOSIS — I2510 Atherosclerotic heart disease of native coronary artery without angina pectoris: Secondary | ICD-10-CM | POA: Diagnosis not present

## 2017-01-21 DIAGNOSIS — Z9981 Dependence on supplemental oxygen: Secondary | ICD-10-CM | POA: Diagnosis not present

## 2017-01-22 DIAGNOSIS — Z452 Encounter for adjustment and management of vascular access device: Secondary | ICD-10-CM | POA: Diagnosis not present

## 2017-01-22 DIAGNOSIS — Z48812 Encounter for surgical aftercare following surgery on the circulatory system: Secondary | ICD-10-CM | POA: Diagnosis not present

## 2017-01-22 DIAGNOSIS — Z9981 Dependence on supplemental oxygen: Secondary | ICD-10-CM | POA: Diagnosis not present

## 2017-01-22 DIAGNOSIS — I2510 Atherosclerotic heart disease of native coronary artery without angina pectoris: Secondary | ICD-10-CM | POA: Diagnosis not present

## 2017-01-22 DIAGNOSIS — I509 Heart failure, unspecified: Secondary | ICD-10-CM | POA: Diagnosis not present

## 2017-01-22 DIAGNOSIS — Z7984 Long term (current) use of oral hypoglycemic drugs: Secondary | ICD-10-CM | POA: Diagnosis not present

## 2017-01-22 DIAGNOSIS — Z951 Presence of aortocoronary bypass graft: Secondary | ICD-10-CM | POA: Diagnosis not present

## 2017-01-26 ENCOUNTER — Telehealth: Payer: Self-pay | Admitting: Family Medicine

## 2017-01-26 ENCOUNTER — Ambulatory Visit (HOSPITAL_COMMUNITY)
Admission: RE | Admit: 2017-01-26 | Discharge: 2017-01-26 | Disposition: A | Discharge status: Home / Self Care | Payer: PPO | Source: Ambulatory Visit | Attending: Cardiology | Admitting: Cardiology

## 2017-01-26 VITALS — BP 92/56 | HR 69 | Wt 163.0 lb

## 2017-01-26 DIAGNOSIS — I5042 Chronic combined systolic (congestive) and diastolic (congestive) heart failure: Secondary | ICD-10-CM | POA: Diagnosis not present

## 2017-01-26 DIAGNOSIS — Z951 Presence of aortocoronary bypass graft: Secondary | ICD-10-CM | POA: Insufficient documentation

## 2017-01-26 DIAGNOSIS — E119 Type 2 diabetes mellitus without complications: Secondary | ICD-10-CM | POA: Insufficient documentation

## 2017-01-26 DIAGNOSIS — I2510 Atherosclerotic heart disease of native coronary artery without angina pectoris: Secondary | ICD-10-CM | POA: Insufficient documentation

## 2017-01-26 DIAGNOSIS — I5022 Chronic systolic (congestive) heart failure: Secondary | ICD-10-CM | POA: Diagnosis not present

## 2017-01-26 DIAGNOSIS — I951 Orthostatic hypotension: Secondary | ICD-10-CM

## 2017-01-26 DIAGNOSIS — Z7901 Long term (current) use of anticoagulants: Secondary | ICD-10-CM | POA: Insufficient documentation

## 2017-01-26 DIAGNOSIS — I5032 Chronic diastolic (congestive) heart failure: Secondary | ICD-10-CM

## 2017-01-26 DIAGNOSIS — I480 Paroxysmal atrial fibrillation: Secondary | ICD-10-CM | POA: Diagnosis not present

## 2017-01-26 LAB — CBC
HCT: 33.4 % — ABNORMAL LOW (ref 36.0–46.0)
HEMOGLOBIN: 10.1 g/dL — AB (ref 12.0–15.0)
MCH: 26.4 pg (ref 26.0–34.0)
MCHC: 30.2 g/dL (ref 30.0–36.0)
MCV: 87.2 fL (ref 78.0–100.0)
PLATELETS: 352 10*3/uL (ref 150–400)
RBC: 3.83 MIL/uL — ABNORMAL LOW (ref 3.87–5.11)
RDW: 16.4 % — AB (ref 11.5–15.5)
WBC: 6.5 10*3/uL (ref 4.0–10.5)

## 2017-01-26 LAB — COOXEMETRY PANEL
Carboxyhemoglobin: 1 % (ref 0.5–1.5)
Methemoglobin: 1.1 % (ref 0.0–1.5)
O2 SAT: 52.1 %
Total hemoglobin: 10.6 g/dL — ABNORMAL LOW (ref 12.0–16.0)

## 2017-01-26 LAB — BASIC METABOLIC PANEL
ANION GAP: 11 (ref 5–15)
BUN: 33 mg/dL — ABNORMAL HIGH (ref 6–20)
CALCIUM: 9 mg/dL (ref 8.9–10.3)
CO2: 25 mmol/L (ref 22–32)
CREATININE: 1.55 mg/dL — AB (ref 0.44–1.00)
Chloride: 99 mmol/L — ABNORMAL LOW (ref 101–111)
GFR calc Af Amer: 38 mL/min — ABNORMAL LOW (ref >60)
GFR, EST NON AFRICAN AMERICAN: 33 mL/min — AB (ref >60)
GLUCOSE: 270 mg/dL — AB (ref 65–99)
Potassium: 3.7 mmol/L (ref 3.5–5.1)
Sodium: 135 mmol/L (ref 135–145)

## 2017-01-26 MED ORDER — AMIODARONE HCL 200 MG PO TABS: 200.0000 mg | ORAL_TABLET | Freq: Every day | ORAL | 6 refills | Status: DC

## 2017-01-26 MED ORDER — ONDANSETRON 4 MG PO TBDP: 4.0000 mg | ORAL_TABLET | Freq: Three times a day (TID) | ORAL | 0 refills | Status: DC | PRN

## 2017-01-26 MED ORDER — FUROSEMIDE 40 MG PO TABS: ORAL_TABLET | ORAL | 3 refills | Status: DC

## 2017-01-26 MED FILL — ?ONDANSETRON ODT 4 MG TABLE: 4 | 6 days supply | Qty: 20 | Fill #0

## 2017-01-26 MED FILL — FUROSEMIDE 40 MG TABLET: 40 | 30 days supply | Qty: 90 | Fill #0

## 2017-01-26 MED FILL — AMIODARONE HCL 200 MG TAB: 200 | 30 days supply | Qty: 30 | Fill #0

## 2017-01-26 NOTE — Progress Notes (Addendum)
Patient ID: [name], female   DOB: 05-16-1946, 71 y.o.   MRN: [id number]    Advanced Heart Failure Clinic Note    Primary HF Cardiologist: Dr Haroldine Laws EP: Dr Lovena Le.  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 Jude ICD 04/2015, & chronic diastolic CHF (EF 89-21% by echo, 45-50% by cath).   Admitted 09/17/15 with new rapid aflutter (previously PAF/VT) with plans for cardioversion. She was also noted to be volume overload in setting of non-compliance (thought to be dietary). Had TEE/DC-CV with return to NSR. She developed laryngospasm and required brief intubation but recovered in PACU post procedure. Enrolled in the ReDS vest trial,  Reading 53%. She continued to diurese with IV lasix and transitioned to lasix 40 mg twice a day. On the day of discharge Reds Vest reading was 39. Discharge weight was 160 pounds.   Admitted 11/27/16-01/08/17 with VF shocked appropriately by ICD. Underwent cath that showed severe 3 vessel disease She underwent CABG x 4, MAZE, and LAA clip. Post operatively developed AF with RVR and low output heart failure, initial co ox was 36%.Converted to NSR on amio. She was started on milrinone but unable to wean off successfully due to recurrent low output HF. Echo showed EF 45-50% with mild RV hypokinesis.  RHC performed on milrinone which showed primarily RV failure. Suspicion for amyloid raised. SPEP negative. Technetium pyrophosphate scan negative for TTR amyloid. Sent home on milrinone 0.375 mcg. Also had e. Coli UTI and treated with abx,   Pt returns today for follow up. At last visit milrinone weaned and lasix increased. She is down 17 lbs from her last visit by our scales. Weight at home down to 163 lbs. (was 174 when she arrived home from hospital) Feels tired and  worn out. Only took lasix 40 mg BID yesterday. Lightheaded this morning. Felt very weak yesterday. Denies any fevers or chills. Denies CP. Has had 1-2 episodes of palpitations. Denies SOB or orthopnea. No problems with PICC. No fevers or chills.  Labs 01/22/17 Creatinine 1.26   ICD interrogation : Thoracic impedence above threshold, Dry, AT/AF burden 24% since May 10/2016, which includes hospitalization. None since discharge.  [place] 01/02/17  Done on milrinone 0.129mcg/kg/min  RA = 10  RV = 40/15 PA = 42/15 (28) PCW = 16 Fick cardiac output/index = 6.1/3.3 Thermo CO/CI = 5.0/2.7 PVR = 2.4 FA sat = 96% PA sat = 63%. 64%   09/2015 TEE EF 35-40% No evidence of thrombus  09/18/2015 ECHO EF 45-50%   Labs 09/24/2015: K 4.1 Creatinine 1.33   Review of systems complete and found to be negative unless listed in HPI.    SH:  Social History   Social History  . Marital status: Divorced    Spouse name: N/A  . Number of children: N/A  . Years of education: N/A   Occupational History  . Not on file.   Social History Main Topics  . Smoking status: Former Smoker    Years: 4.00    Quit date: 12/31/1980  . Smokeless tobacco: Never Used  . Alcohol use No  . Drug use: No  . Sexual activity: Not Currently   Other Topics Concern  . Not on file   Social History Narrative   Divorced.  Lives with daughter and grandson. 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    FH:  Family History  Problem Relation Age of Onset  . Breast cancer Mother   . Diabetes Father   . Stroke Father   . Heart disease Sister   . Healthy Sister     Past Medical History:  Diagnosis Date  . Abnormal PFT   . CAD (coronary artery disease), native coronary artery - 3 vessel 04/21/2015   a. NSTEMI 8-04/2015 felt 2/2 demand ischemia. b. 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  . Chronic diastolic CHF (congestive heart failure) ([place])   . Diabetes mellitus ([place])    . Diabetes mellitus without complication ([place])    dx on wed  . Hypertrophic cardiomyopathy ([place])   . Left atrial enlargement   . Mitral regurgitation    a. Echo 04/2015: moderate mitral regurgitation. b. F/u echo 05/2015: mild MR.  . NSTEMI (non-ST elevated myocardial infarction) ([place]) 11/27/2016  . Paroxysmal atrial fibrillation ([name])   . Shortness of breath dyspnea   . Tricuspid regurgitation    a. Echo 04/2015: Mod-severe TR. b. Not mentioned on echo 05/2015  . Ventricular tachycardia ([place])    a. s/p St. Jude ICD implanted 04/2015.    Current Outpatient Prescriptions  Medication Sig Dispense Refill  . amiodarone (PACERONE) 200 MG tablet Take 1 tablet (200 mg total) by mouth 2 (two) times daily. 60 tablet 1  . apixaban (ELIQUIS) 5 MG TABS tablet Take 1 tablet (5 mg total) by mouth 2 (two) times daily. 60 tablet 1  . atorvastatin (LIPITOR) 40 MG tablet Take 1 tablet (40 mg total) by mouth daily at 6 PM. 30 tablet 1  . Blood Glucose Monitoring Suppl (TRUE METRIX METER) DEVI 1 each by Does not apply route 2 (two) times daily. 1 Device 0  . folic acid (FOLVITE) 1 MG tablet Take 1 tablet (1 mg total) by mouth daily. 30 tablet 1  . furosemide (LASIX) 40 MG tablet Take 2 tablets (80 mg total) by mouth 2 (two) times daily. 120 tablet 3  . glipiZIDE (GLUCOTROL) 5 MG tablet Take 1 tablet (5 mg total) by mouth daily. 90 tablet 1  . glucose blood (TRUE METRIX BLOOD GLUCOSE TEST) test strip Use as instructed twice daily 100 each 12  . methylPREDNISolone (MEDROL DOSEPAK) 4 MG TBPK tablet As directed for six day taper 21 tablet 0  . milrinone (PRIMACOR) 20 MG/100 ML SOLN infusion Inject 20 mcg/min into the vein continuous. 100 mL 10  . TRUEPLUS LANCETS 28G MISC Use as instructed twice daily. 60 each 5   No current facility-administered medications for this encounter.     Vitals:   01/26/17 1146  BP: (!) 92/56  Pulse: 69  SpO2: 95%  Weight: 163 lb (73.9 kg)   Orthostatics  Standing systolic  96 Standing diastolic 88   Wt Readings from Last 3 Encounters:  01/26/17 163 lb (73.9 kg)  01/13/17 174 lb (78.9 kg)  01/12/17 180 lb 8 oz (81.9 kg)    General: Fatigued and chronically ill appearing. NAD HEENT: Normal Neck: Supple. JVP 6-7 cm. Carotids 2+ bilat; no bruits. No thyromegaly or nodule noted. Cor: PMI nondisplaced. RRR, 2/6 TR no S3 Lungs: CTAB, normal effort. Abdomen: Soft, non-tender, non-distended, no HSM. No bruits or masses. +BS  Extremities: No cyanosis, clubbing, or rash. No peripheral edema. RUE PICC site stable without drainage or erythema. Neuro: Alert & orientedx3, cranial nerves grossly intact. moves all  4 extremities w/o difficulty. Affect flat   ASSESSMENT & PLAN: 1. 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 EF 45-50% post CABG 5/18 - She is now s/p VF arrest and CABG in 4/18. Prolonged post-hospital course with low output HF despite relatively preserved EF. RHC suggestive of primarily RV failure.  - Technetium pyrophosphate scan negative for amyloid. - Feeling worse. Continue milrinone to 0.25. Coox today.  - Mildly orthostatic. Dry on exam and corevue Hold lasix x 2 dose, then decrease back to 80 mg q am and 40 mg q pm.  - Developed significant nausea and numbness around her lips and mouth after starting digoxin. Now off.  2. PAF - S/P DC-CV 09/20/15. - had recurrent AF post-CABG.  - Remains in NSR on amio. Decrease back to 200 mg daily.  - Continue Eliquis. Denies bleeding.  3. CAD - 3V CAD s/p CABG. Maze and LAA clipping 4/18 - Continue statin. No ASA with Eliquis. No change.  4. H/O VT with ICD placed 04/2015. - s/p VF arrest 4/18 with ICD shock. No change.  5. DM2  - Per PCP.  6. H/o orthostatic hypotension - previously on midodrine. Down 17 lbs. Suspect she has been overdiuresed. - Mildly orthostatic on exam today.   Hold lasix x 2 doses. Labs today including coox. Decrease amio. Will give small supply of Zofran. Check EKG next visit. RTC 2  weeks.   Shirley Friar, PA-C  11:48 AM  Greater than 50% of the 25 minute visit was spent in counseling/coordination of care regarding disease state education, sliding scale diuretics, .salt/fluid restriction, and medication reconciliation.

## 2017-01-26 NOTE — Progress Notes (Signed)
Advanced Heart Failure Medication Review by a Pharmacist  Does the patient  feel that his/her medications are working for him/her?  yes  Has the patient been experiencing any side effects to the medications prescribed?  no  Does the patient measure his/her own blood pressure or blood glucose at home?  no   Does the patient have any problems obtaining medications due to transportation or finances?   no  Understanding of regimen: good Understanding of indications: good Potential of compliance: good Patient understands to avoid NSAIDs. Patient understands to avoid decongestants.  Issues to address at subsequent visits: None   Pharmacist comments: [name] is a pleasant 71 yo F presenting with her daughter and without a medication list but with good recall of her regimen. She reports good compliance with her regimen but does report only taking furosemide 40 mg BID yesterday instead of prescribed 80 mg BID since she has been feeling nauseous and dizzy recently. No other medication-related questions or concerns for me at this time.   [name], PharmD, BCPS, CPP Clinical Pharmacist Pager: [phone] Phone: [phone] 01/26/2017 11:53 AM      Time with patient: 10 minutes Preparation and documentation time: 2 minutes Total time: 12 minutes

## 2017-01-26 NOTE — Patient Instructions (Addendum)
Routine lab work today. Will notify you of abnormal results, otherwise no news is good news!  DECREASE Amiodarone to 200 mg (1 tab) once daily.  HOLD Lasix tonight and tomorrow morning. Restart 80 mg (2 tabs) in am and 40 mg (1 tab) in pm.  Take Zofran dissolvable tablets once every 8 hours as needed for nausea.  Follow up 2 weeks with Oda Kilts PA-C. Take all medication as prescribed the day of your appointment. Bring all medications with you to your appointment.  Do the following things EVERYDAY: 1) Weigh yourself in the morning before breakfast. Write it down and keep it in a log. 2) Take your medicines as prescribed 3) Eat low salt foods-Limit salt (sodium) to 2000 mg per day.  4) Stay as active as you can everyday 5) Limit all fluids for the day to less than 2 liters

## 2017-01-26 NOTE — Telephone Encounter (Signed)
Call placed to [phone] and voice message was left for patient asking her to call the office to update [place] on her status and reschedule her appointment.

## 2017-01-26 NOTE — Addendum Note (Signed)
Encounter addended by: Shirley Friar, PA-C on: 01/26/2017 12:23 PM<BR>    Actions taken: Sign clinical note

## 2017-01-27 DIAGNOSIS — Z9981 Dependence on supplemental oxygen: Secondary | ICD-10-CM | POA: Diagnosis not present

## 2017-01-27 DIAGNOSIS — I2510 Atherosclerotic heart disease of native coronary artery without angina pectoris: Secondary | ICD-10-CM | POA: Diagnosis not present

## 2017-01-27 DIAGNOSIS — Z951 Presence of aortocoronary bypass graft: Secondary | ICD-10-CM | POA: Diagnosis not present

## 2017-01-27 DIAGNOSIS — Z7984 Long term (current) use of oral hypoglycemic drugs: Secondary | ICD-10-CM | POA: Diagnosis not present

## 2017-01-27 DIAGNOSIS — Z48812 Encounter for surgical aftercare following surgery on the circulatory system: Secondary | ICD-10-CM | POA: Diagnosis not present

## 2017-01-29 DIAGNOSIS — Z48812 Encounter for surgical aftercare following surgery on the circulatory system: Secondary | ICD-10-CM | POA: Diagnosis not present

## 2017-01-29 DIAGNOSIS — Z7984 Long term (current) use of oral hypoglycemic drugs: Secondary | ICD-10-CM | POA: Diagnosis not present

## 2017-01-29 DIAGNOSIS — I5033 Acute on chronic diastolic (congestive) heart failure: Secondary | ICD-10-CM | POA: Diagnosis not present

## 2017-01-29 DIAGNOSIS — I2510 Atherosclerotic heart disease of native coronary artery without angina pectoris: Secondary | ICD-10-CM | POA: Diagnosis not present

## 2017-01-29 DIAGNOSIS — Z9981 Dependence on supplemental oxygen: Secondary | ICD-10-CM | POA: Diagnosis not present

## 2017-01-29 DIAGNOSIS — Z951 Presence of aortocoronary bypass graft: Secondary | ICD-10-CM | POA: Diagnosis not present

## 2017-01-30 ENCOUNTER — Other Ambulatory Visit (HOSPITAL_COMMUNITY): Payer: Self-pay | Admitting: Internal Medicine

## 2017-02-03 ENCOUNTER — Ambulatory Visit: Payer: PPO | Admitting: Thoracic Surgery (Cardiothoracic Vascular Surgery)

## 2017-02-03 ENCOUNTER — Telehealth (HOSPITAL_COMMUNITY): Payer: Self-pay | Admitting: *Deleted

## 2017-02-03 NOTE — Telephone Encounter (Signed)
Alexa with [facility] called to let us know that patient refused her visit today for her milrinone infusion.  She will go back and check on patient this Thursday.

## 2017-02-05 DIAGNOSIS — Z48812 Encounter for surgical aftercare following surgery on the circulatory system: Secondary | ICD-10-CM | POA: Diagnosis not present

## 2017-02-05 DIAGNOSIS — Z452 Encounter for adjustment and management of vascular access device: Secondary | ICD-10-CM | POA: Diagnosis not present

## 2017-02-05 DIAGNOSIS — Z951 Presence of aortocoronary bypass graft: Secondary | ICD-10-CM | POA: Diagnosis not present

## 2017-02-05 DIAGNOSIS — Z7984 Long term (current) use of oral hypoglycemic drugs: Secondary | ICD-10-CM | POA: Diagnosis not present

## 2017-02-05 DIAGNOSIS — I5033 Acute on chronic diastolic (congestive) heart failure: Secondary | ICD-10-CM | POA: Diagnosis not present

## 2017-02-05 DIAGNOSIS — I2510 Atherosclerotic heart disease of native coronary artery without angina pectoris: Secondary | ICD-10-CM | POA: Diagnosis not present

## 2017-02-05 DIAGNOSIS — I509 Heart failure, unspecified: Secondary | ICD-10-CM | POA: Diagnosis not present

## 2017-02-05 DIAGNOSIS — Z9981 Dependence on supplemental oxygen: Secondary | ICD-10-CM | POA: Diagnosis not present

## 2017-02-05 MED FILL — ELIQUIS 5 MG TABLET: 5 | 30 days supply | Qty: 60 | Fill #1

## 2017-02-09 ENCOUNTER — Other Ambulatory Visit: Payer: Self-pay | Admitting: Thoracic Surgery (Cardiothoracic Vascular Surgery)

## 2017-02-09 DIAGNOSIS — Z951 Presence of aortocoronary bypass graft: Secondary | ICD-10-CM

## 2017-02-10 ENCOUNTER — Encounter: Payer: Self-pay | Admitting: Thoracic Surgery (Cardiothoracic Vascular Surgery)

## 2017-02-10 ENCOUNTER — Ambulatory Visit
Admission: RE | Admit: 2017-02-10 | Discharge: 2017-02-10 | Disposition: A | Discharge status: Home / Self Care | Payer: PPO | Source: Ambulatory Visit | Attending: Thoracic Surgery (Cardiothoracic Vascular Surgery) | Admitting: Thoracic Surgery (Cardiothoracic Vascular Surgery)

## 2017-02-10 ENCOUNTER — Encounter (HOSPITAL_COMMUNITY): Payer: Self-pay

## 2017-02-10 ENCOUNTER — Ambulatory Visit (INDEPENDENT_AMBULATORY_CARE_PROVIDER_SITE_OTHER): Payer: Self-pay | Admitting: Thoracic Surgery (Cardiothoracic Vascular Surgery)

## 2017-02-10 ENCOUNTER — Ambulatory Visit (HOSPITAL_COMMUNITY)
Admission: RE | Admit: 2017-02-10 | Discharge: 2017-02-10 | Disposition: A | Discharge status: Home / Self Care | Payer: PPO | Source: Ambulatory Visit | Attending: Internal Medicine | Admitting: Internal Medicine

## 2017-02-10 ENCOUNTER — Telehealth (HOSPITAL_COMMUNITY): Payer: Self-pay

## 2017-02-10 VITALS — BP 102/58 | HR 80 | Wt 162.4 lb

## 2017-02-10 VITALS — BP 91/61 | HR 72 | Resp 20 | Ht 64.0 in | Wt 162.0 lb

## 2017-02-10 DIAGNOSIS — I340 Nonrheumatic mitral (valve) insufficiency: Secondary | ICD-10-CM | POA: Insufficient documentation

## 2017-02-10 DIAGNOSIS — I252 Old myocardial infarction: Secondary | ICD-10-CM | POA: Insufficient documentation

## 2017-02-10 DIAGNOSIS — Z95810 Presence of automatic (implantable) cardiac defibrillator: Secondary | ICD-10-CM | POA: Insufficient documentation

## 2017-02-10 DIAGNOSIS — Z951 Presence of aortocoronary bypass graft: Secondary | ICD-10-CM | POA: Diagnosis not present

## 2017-02-10 DIAGNOSIS — I2510 Atherosclerotic heart disease of native coronary artery without angina pectoris: Secondary | ICD-10-CM | POA: Insufficient documentation

## 2017-02-10 DIAGNOSIS — Z9889 Other specified postprocedural states: Secondary | ICD-10-CM | POA: Insufficient documentation

## 2017-02-10 DIAGNOSIS — I5042 Chronic combined systolic (congestive) and diastolic (congestive) heart failure: Secondary | ICD-10-CM | POA: Insufficient documentation

## 2017-02-10 DIAGNOSIS — I5022 Chronic systolic (congestive) heart failure: Secondary | ICD-10-CM

## 2017-02-10 DIAGNOSIS — Z9119 Patient's noncompliance with other medical treatment and regimen: Secondary | ICD-10-CM | POA: Diagnosis not present

## 2017-02-10 DIAGNOSIS — R200 Anesthesia of skin: Secondary | ICD-10-CM | POA: Insufficient documentation

## 2017-02-10 DIAGNOSIS — I472 Ventricular tachycardia: Secondary | ICD-10-CM | POA: Diagnosis not present

## 2017-02-10 DIAGNOSIS — Z7984 Long term (current) use of oral hypoglycemic drugs: Secondary | ICD-10-CM | POA: Diagnosis not present

## 2017-02-10 DIAGNOSIS — I480 Paroxysmal atrial fibrillation: Secondary | ICD-10-CM | POA: Diagnosis not present

## 2017-02-10 DIAGNOSIS — I5032 Chronic diastolic (congestive) heart failure: Secondary | ICD-10-CM

## 2017-02-10 DIAGNOSIS — Z87891 Personal history of nicotine dependence: Secondary | ICD-10-CM | POA: Diagnosis not present

## 2017-02-10 DIAGNOSIS — I4892 Unspecified atrial flutter: Secondary | ICD-10-CM | POA: Insufficient documentation

## 2017-02-10 DIAGNOSIS — T460X5A Adverse effect of cardiac-stimulant glycosides and drugs of similar action, initial encounter: Secondary | ICD-10-CM | POA: Insufficient documentation

## 2017-02-10 DIAGNOSIS — Z7901 Long term (current) use of anticoagulants: Secondary | ICD-10-CM | POA: Diagnosis not present

## 2017-02-10 DIAGNOSIS — E119 Type 2 diabetes mellitus without complications: Secondary | ICD-10-CM | POA: Diagnosis not present

## 2017-02-10 DIAGNOSIS — I422 Other hypertrophic cardiomyopathy: Secondary | ICD-10-CM | POA: Insufficient documentation

## 2017-02-10 DIAGNOSIS — I951 Orthostatic hypotension: Secondary | ICD-10-CM | POA: Insufficient documentation

## 2017-02-10 DIAGNOSIS — R531 Weakness: Secondary | ICD-10-CM | POA: Diagnosis not present

## 2017-02-10 LAB — COMPREHENSIVE METABOLIC PANEL
ALBUMIN: 3.6 g/dL (ref 3.5–5.0)
ALK PHOS: 93 U/L (ref 38–126)
ALT: 15 U/L (ref 14–54)
ANION GAP: 7 (ref 5–15)
AST: 17 U/L (ref 15–41)
BUN: 32 mg/dL — ABNORMAL HIGH (ref 6–20)
CALCIUM: 9.4 mg/dL (ref 8.9–10.3)
CHLORIDE: 103 mmol/L (ref 101–111)
CO2: 25 mmol/L (ref 22–32)
Creatinine, Ser: 1.65 mg/dL — ABNORMAL HIGH (ref 0.44–1.00)
GFR calc non Af Amer: 31 mL/min — ABNORMAL LOW (ref >60)
GFR, EST AFRICAN AMERICAN: 36 mL/min — AB (ref >60)
GLUCOSE: 197 mg/dL — AB (ref 65–99)
Potassium: 4.5 mmol/L (ref 3.5–5.1)
SODIUM: 135 mmol/L (ref 135–145)
Total Bilirubin: 0.6 mg/dL (ref 0.3–1.2)
Total Protein: 6.4 g/dL — ABNORMAL LOW (ref 6.5–8.1)

## 2017-02-10 LAB — COOXEMETRY PANEL
CARBOXYHEMOGLOBIN: 0.9 % (ref 0.5–1.5)
METHEMOGLOBIN: 1.2 % (ref 0.0–1.5)
O2 SAT: 61.7 %
TOTAL HEMOGLOBIN: 11.4 g/dL — AB (ref 12.0–16.0)

## 2017-02-10 MED ORDER — FOLIC ACID 1 MG PO TABS: 1.0000 mg | ORAL_TABLET | Freq: Every day | ORAL | 1 refills | Status: DC

## 2017-02-10 MED ORDER — ONDANSETRON 4 MG PO TBDP: 4.0000 mg | ORAL_TABLET | Freq: Three times a day (TID) | ORAL | 0 refills | Status: DC | PRN

## 2017-02-10 MED ORDER — MILRINONE LACTATE IN DEXTROSE 20-5 MG/100ML-% IV SOLN: 0.1250 ug/kg/min | INTRAVENOUS | 10 refills | Status: DC

## 2017-02-10 MED ORDER — RANITIDINE HCL 75 MG PO TABS: 75.0000 mg | ORAL_TABLET | Freq: Two times a day (BID) | ORAL | Status: DC | PRN

## 2017-02-10 NOTE — Progress Notes (Signed)
Patient ID: [name], female   DOB: [birth year], 71 y.o.   MRN: [id number]    Advanced Heart Failure Clinic Note    Primary HF Cardiologist: Dr Haroldine Laws EP: Dr Lovena Le.  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 Jude ICD 04/2015, & chronic diastolic CHF (EF 78-29% by echo, 45-50% by cath).   Admitted 09/17/15 with new rapid aflutter (previously PAF/VT) with plans for cardioversion. She was also noted to be volume overload in setting of non-compliance (thought to be dietary). Had TEE/DC-CV with return to NSR. She developed laryngospasm and required brief intubation but recovered in PACU post procedure. Enrolled in the ReDS vest trial,  Reading 53%. She continued to diurese with IV lasix and transitioned to lasix 40 mg twice a day. On the day of discharge Reds Vest reading was 39. Discharge weight was 160 pounds.   Admitted 11/27/16-01/08/17 with VF shocked appropriately by ICD. Underwent cath that showed severe 3 vessel disease She underwent CABG x 4, MAZE, and LAA clip. Post operatively developed AF with RVR and low output heart failure, initial co ox was 36%.Converted to NSR on amio. She was started on milrinone but unable to wean off successfully due to recurrent low output HF. Echo showed EF 45-50% with mild RV hypokinesis.  RHC performed on milrinone which showed primarily RV failure. Suspicion for amyloid raised. SPEP negative. Technetium pyrophosphate scan negative for TTR amyloid. Sent home on milrinone 0.375 mcg. Also had e. Coli UTI and treated with abx,   Pt presents today for follow up. Last visit was orthostatic Lasix cut back. Pt has been taking lasix 40 mg daily. Breathing has been good. Remains lightheaded at times, but not as bad. Not limiting. No bleeding, melena, or BRBPR on  eliquis. No orthopnea. Appetite much better this week. Feels like she doesn't have very much energy. Denies fevers or chills. Denies redness around PICC site. Rare palpitations.   Labs 01/22/17 Creatinine 1.26  Labs 02/05/17 Creatinine 1.5; K 3.5  ICD interrogation : Thoracic impedence trending down, AT/AF burden 19% ( last visit was 24%, so trending down)  RHC 01/02/17  Done on milrinone 0.126mcg/kg/min  RA = 10  RV = 40/15 PA = 42/15 (28) PCW = 16 Fick cardiac output/index = 6.1/3.3 Thermo CO/CI = 5.0/2.7 PVR = 2.4 FA sat = 96% PA sat = 63%. 64%   09/2015 TEE EF 35-40% No evidence of thrombus  09/18/2015 ECHO EF 45-50%   Labs 09/24/2015: K 4.1 Creatinine 1.33   Review of systems complete and found to be negative unless listed in HPI.    SH:  Social History   Social History  . Marital status: Divorced    Spouse name: N/A  . Number of children: N/A  . Years of education: N/A   Occupational History  . Not on file.   Social History Main Topics  . Smoking status: Former Smoker    Years: 4.00    Quit date: 12/31/1980  . Smokeless tobacco: Never Used  . Alcohol use No  . Drug use: No  . Sexual activity: Not Currently   Other Topics Concern  . Not on file   Social History Narrative   Divorced.  Lives with daughter and grandson. 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    FH:  Family History  Problem Relation Age of Onset  . Breast cancer Mother   . Diabetes Father   . Stroke Father   . Heart disease Sister   . Healthy Sister     Past Medical History:  Diagnosis Date  . Abnormal PFT   . CAD (coronary artery disease), native coronary artery - 3 vessel 04/21/2015   a. NSTEMI 8-04/2015 felt 2/2 demand ischemia. b. 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  . Chronic diastolic CHF (congestive heart failure) ([place])   . Diabetes mellitus ([place])   . Diabetes mellitus without complication ([place])    dx on wed  .  Hypertrophic cardiomyopathy ([place])   . Left atrial enlargement   . Mitral regurgitation    a. Echo 04/2015: moderate mitral regurgitation. b. F/u echo 05/2015: mild MR.  . NSTEMI (non-ST elevated myocardial infarction) ([place]) 11/27/2016  . Paroxysmal atrial fibrillation ([name])   . Shortness of breath dyspnea   . Tricuspid regurgitation    a. Echo 04/2015: Mod-severe TR. b. Not mentioned on echo 05/2015  . Ventricular tachycardia ([place])    a. s/p St. Jude ICD implanted 04/2015.    Current Outpatient Prescriptions  Medication Sig Dispense Refill  . amiodarone (PACERONE) 200 MG tablet Take 1 tablet (200 mg total) by mouth daily. 30 tablet 6  . apixaban (ELIQUIS) 5 MG TABS tablet Take 1 tablet (5 mg total) by mouth 2 (two) times daily. 60 tablet 1  . atorvastatin (LIPITOR) 40 MG tablet Take 1 tablet (40 mg total) by mouth daily at 6 PM. 30 tablet 1  . Blood Glucose Monitoring Suppl (TRUE METRIX METER) DEVI 1 each by Does not apply route 2 (two) times daily. 1 Device 0  . folic acid (FOLVITE) 1 MG tablet Take 1 tablet (1 mg total) by mouth daily. 30 tablet 1  . furosemide (LASIX) 40 MG tablet Take 40 mg by mouth daily.    [name] glipiZIDE (GLUCOTROL) 5 MG tablet Take 1 tablet (5 mg total) by mouth daily. 90 tablet 1  . glucose blood (TRUE METRIX BLOOD GLUCOSE TEST) test strip Use as instructed twice daily 100 each 12  . milrinone (PRIMACOR) 20 MG/100 ML SOLN infusion Inject 20 mcg/min into the vein continuous. 100 mL 10  . ondansetron (ZOFRAN-ODT) 4 MG disintegrating tablet Take 1 tablet (4 mg total) by mouth every 8 (eight) hours as needed for nausea or vomiting. 20 tablet 0  . TRUEPLUS LANCETS 28G MISC Use as instructed twice daily. 60 each 5   No current facility-administered medications for this encounter.     Vitals:   02/10/17 0956  BP: (!) 102/58  Pulse: 80  SpO2: 98%  Weight: 162 lb 6 oz (73.7 kg)   Standing 108/60  Wt Readings from Last 3 Encounters:  02/10/17 162 lb 6 oz (73.7 kg)    01/26/17 163 lb (73.9 kg)  01/13/17 174 lb (78.9 kg)    General:  No resp difficulty. HEENT: Normal Neck: Supple. JVP 6-7. Carotids 2+ bilat; no bruits. No thyromegaly or nodule noted. Cor: PMI nondisplaced. RRR, 2/6 TR no S3  Lungs: CTAB, normal effort. Abdomen: Soft, non-tender, non-distended, no HSM. No bruits or masses. +BS  Extremities: No cyanosis, clubbing, rash, R and LLE no edema. RUE PICC site stable without drainage or erythema. Neuro: Alert & orientedx3, cranial nerves grossly intact. moves all 4 extremities w/o difficulty. Affect pleasant   ASSESSMENT & PLAN: 1. Chronic systolic/diastolic HF due  to iCM. EF 45-50% post CABG 5/18 - She is now s/p VF arrest and CABG in 4/18. Prolonged post-hospital course with low output HF despite relatively preserved EF. RHC suggestive of primarily RV failure.  - Technetium pyrophosphate scan negative for amyloid. - Feeling better. Milrinone remains at 0.25 mcg/kg/min.  Coox today. - Volume status stable. Thoracic impedence trending down. Continue lasix 40 mg daily. Take extra 40 mg daily as needed.  - Developed significant nausea and numbness around her lips and mouth after starting digoxin. Now off. Would not re-challenge. - Labs today. May be able to add low dose spiro.  - Reinforced fluid restriction to < 2 L daily, sodium restriction to less than 2000 mg daily, and the importance of daily weights.  2. PAF - S/P DC-CV 09/20/15. - had recurrent AF post-CABG.  - Remains in NSR on amio. Continue amiodarone 200 mg daily.   - Continue Eliquis. No bleeding - AFIB burden 24% -> 19%. No AF/AT alert since last month.  3. CAD - 3V CAD s/p CABG. Maze and LAA clipping 4/18 - Continue statin. No ASA with Eliquis. No change. 4. H/O VT with ICD placed 04/2015. - s/p VF arrest 4/18 with ICD shock. No change. 5. DM2  - Per PCP 6. H/o orthostatic hypotension - Not orthostatic today. Weight stable. Continue lower dose of lasix as above.  - Limits  med titration.   Shirley Friar, PA-C  10:03 AM

## 2017-02-10 NOTE — Addendum Note (Signed)
Encounter addended by: Effie Berkshire, RN on: 02/10/2017 12:29 PM<BR>    Actions taken: Order list changed

## 2017-02-10 NOTE — Telephone Encounter (Signed)
Comprehensive Metabolic Panel (CMET)  Order: [id number]  Status:  Final result Visible to patient:  No (Not Released) Dx:  Chronic diastolic CHF (congestive hea...  Notes recorded by Effie Berkshire, RN on 02/10/2017 at 4:46 PM EDT Left detailed message to return call to discuss results and wean and to schedule 1 week lab for coox and bmet. Order faxed to change mil drip to 0.125 mcg/kg/min ------  Notes recorded by Shirley Friar, PA-C on 02/10/2017 at 4:27 PM EDT Labs stable.    Coox 61.7%. Pt wishes to attempt wean.  Decrease milrinone to 0.125 mcg/kg/min. Recheck Coox and BMET next week .  Thanks!   Legrand Como [address] [place], PA-C 02/10/2017 4:27 PM     Beryle Beams" [place], PA-C 02/10/2017 4:26 PM

## 2017-02-10 NOTE — Progress Notes (Signed)
[address]       [place] [address]             [phone]    HPI: [name] returns for a scheduled follow up visit  She is a 71 year old woman with known coronary disease and arty had an ICD in place. She presented with multiple VT/VF arrests from which the ICD rescued her. She underwent coronary bypass graft 5, Maze procedure, and left atrial clipping on 12/04/2016.  Her early postoperative course was unremarkable but she developed atrial fibrillation with a rapid response and worsening right heart failure. She also had an Escherichia coli urinary tract infection with hematuria and was treated with antibiotics. She was discharged home on milrinone and is being followed by the heart failure team.  I saw her in the office on 01/13/2017. She had improved significantly since discharge from the hospital. She had been started on digoxin. Her milrinone dose had been decreased and her diuretic dose had been increased. Her chest x-ray showed a moderate left pleural effusion.  She saw the heart failure team on 01/26/2017. She was having some orthostatic symptoms and her co-ox was low at 52. She also is having side effects from digoxin. Her digoxin was stopped. Her milrinone was continued 0.25 mcg/kg/m. Her diuretics were decreased.  She was having problems with nausea, but Zofran has helped dramatically. She is feeling better today then she was a couple of weeks ago. Her swelling has improved significantly. She is not having any incisional pain or shortness of breath.  Past Medical History:  Diagnosis Date  . Abnormal PFT   . CAD (coronary artery disease), native coronary artery - 3 vessel 04/21/2015   a. NSTEMI 8-04/2015 felt 2/2 demand ischemia. b. 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  . Chronic diastolic CHF (congestive heart failure) ([place])   . Diabetes mellitus ([place])   . Diabetes mellitus without complication  ([place])    dx on wed  . Hypertrophic cardiomyopathy ([place])   . Left atrial enlargement   . Mitral regurgitation    a. Echo 04/2015: moderate mitral regurgitation. b. F/u echo 05/2015: mild MR.  . NSTEMI (non-ST elevated myocardial infarction) ([facility]) 11/27/2016  . Paroxysmal atrial fibrillation ([name])   . Shortness of breath dyspnea   . Tricuspid regurgitation    a. Echo 04/2015: Mod-severe TR. b. Not mentioned on echo 05/2015  . Ventricular tachycardia ([place])    a. s/p St. Jude ICD implanted 04/2015.    Current Outpatient Prescriptions  Medication Sig Dispense Refill  . amiodarone (PACERONE) 200 MG tablet Take 1 tablet (200 mg total) by mouth daily. 30 tablet 6  . apixaban (ELIQUIS) 5 MG TABS tablet Take 1 tablet (5 mg total) by mouth 2 (two) times daily. 60 tablet 1  . atorvastatin (LIPITOR) 40 MG tablet Take 1 tablet (40 mg total) by mouth daily at 6 PM. 30 tablet 1  . Blood Glucose Monitoring Suppl (TRUE METRIX METER) DEVI 1 each by Does not apply route 2 (two) times daily. 1 Device 0  . folic acid (FOLVITE) 1 MG tablet Take 1 tablet (1 mg total) by mouth daily. 30 tablet 1  . furosemide (LASIX) 40 MG tablet Take 40 mg by mouth daily.    [name] glipiZIDE (GLUCOTROL) 5 MG tablet Take 1 tablet (5 mg total) by mouth daily. 90 tablet 1  . glucose blood (TRUE METRIX BLOOD GLUCOSE TEST) test strip  Use as instructed twice daily 100 each 12  . milrinone (PRIMACOR) 20 MG/100 ML SOLN infusion Inject 20 mcg/min into the vein continuous. 100 mL 10  . ondansetron (ZOFRAN-ODT) 4 MG disintegrating tablet Take 1 tablet (4 mg total) by mouth every 8 (eight) hours as needed for nausea or vomiting. 20 tablet 0  . ranitidine (ZANTAC 75) 75 MG tablet Take 1 tablet (75 mg total) by mouth 2 (two) times daily as needed for heartburn.    . TRUEPLUS LANCETS 28G MISC Use as instructed twice daily. 60 each 5   No current facility-administered medications for this visit.     Physical Exam BP 91/61   Pulse 72   Resp 20    Ht 5\' 4"  (1.626 m)   Wt 162 lb (73.5 kg)   SpO2 95% Comment: RA  BMI 27.30 kg/m  71 year old woman in no acute distress Alert and oriented 3 with no focal deficits Lungs clear with equal breath sounds bilaterally Cardiac regular rate and rhythm with a 2/6 systolic murmur left lower sternal border No edema  Diagnostic Tests:  Co-ox= 62 CHEST  2 VIEW  COMPARISON:  01/13/2017  FINDINGS: There is no focal parenchymal opacity. There is no pleural effusion or pneumothorax. There is mild stable cardiomegaly. There is evidence of prior CABG. There is a right-sided PICC line with the tip projecting over the SVC. There is a dual lead cardiac pacemaker.  The osseous structures are unremarkable.  IMPRESSION: No active cardiopulmonary disease.   Electronically Signed   By: Kathreen Devoid   On: 02/10/2017 11:47 I personally reviewed the chest x-ray from the findings noted above. The left pleural effusion has resolved.  Impression: [name] is a 71 year old woman who presented after multiple out of hospital VT VF arrest rescued by an ICD. She had severe three-vessel disease. She underwent coronary bypass grafting, Maze procedure, and left atrial appendage clip on 12/04/2016. She had a complicated postoperative course, primarily due to right heart failure.  I saw in the office about a month ago. She was doing fairly well at that time but did have a moderate left pleural effusion. That has now completely resolved.  She is not having significant incisional pain.  Overall she feels better on her current medical regimen although nausea is still a problem.  There are no restrictions on her from my standpoint.  Plan: Continue to follow-up with Dr. Haroldine Laws and the heart failure team.  I'll be happy to see her back if I can be of any further assistance with her care.  Melrose Nakayama, MD [facility] [phone]

## 2017-02-10 NOTE — Patient Instructions (Signed)
Routine lab work today. Will notify you of abnormal results, otherwise no news is good news!  START Zantac 75 mg twice daily as needed for heartburn/GERD.  CONTINUE Lasix 40 mg once daily.  Follow up 4 weeks with Oda Kilts PA-C. Take all medication as prescribed the day of your appointment. Bring all medications with you to your appointment.  Do the following things EVERYDAY: 1) Weigh yourself in the morning before breakfast. Write it down and keep it in a log. 2) Take your medicines as prescribed 3) Eat low salt foods-Limit salt (sodium) to 2000 mg per day.  4) Stay as active as you can everyday 5) Limit all fluids for the day to less than 2 liters

## 2017-02-11 DIAGNOSIS — Z48812 Encounter for surgical aftercare following surgery on the circulatory system: Secondary | ICD-10-CM | POA: Diagnosis not present

## 2017-02-11 DIAGNOSIS — I2510 Atherosclerotic heart disease of native coronary artery without angina pectoris: Secondary | ICD-10-CM | POA: Diagnosis not present

## 2017-02-11 DIAGNOSIS — Z9981 Dependence on supplemental oxygen: Secondary | ICD-10-CM | POA: Diagnosis not present

## 2017-02-11 DIAGNOSIS — Z7984 Long term (current) use of oral hypoglycemic drugs: Secondary | ICD-10-CM | POA: Diagnosis not present

## 2017-02-11 DIAGNOSIS — Z951 Presence of aortocoronary bypass graft: Secondary | ICD-10-CM | POA: Diagnosis not present

## 2017-02-12 DIAGNOSIS — Z7984 Long term (current) use of oral hypoglycemic drugs: Secondary | ICD-10-CM | POA: Diagnosis not present

## 2017-02-12 DIAGNOSIS — Z9981 Dependence on supplemental oxygen: Secondary | ICD-10-CM | POA: Diagnosis not present

## 2017-02-12 DIAGNOSIS — I2510 Atherosclerotic heart disease of native coronary artery without angina pectoris: Secondary | ICD-10-CM | POA: Diagnosis not present

## 2017-02-12 DIAGNOSIS — I5033 Acute on chronic diastolic (congestive) heart failure: Secondary | ICD-10-CM | POA: Diagnosis not present

## 2017-02-12 DIAGNOSIS — Z48812 Encounter for surgical aftercare following surgery on the circulatory system: Secondary | ICD-10-CM | POA: Diagnosis not present

## 2017-02-12 DIAGNOSIS — Z951 Presence of aortocoronary bypass graft: Secondary | ICD-10-CM | POA: Diagnosis not present

## 2017-02-12 DIAGNOSIS — I5041 Acute combined systolic (congestive) and diastolic (congestive) heart failure: Secondary | ICD-10-CM | POA: Diagnosis not present

## 2017-02-12 MED ORDER — MILRINONE LACTATE IN DEXTROSE 20-5 MG/100ML-% IV SOLN: 0.1250 ug/kg/min | INTRAVENOUS | 10 refills | Status: DC

## 2017-02-17 ENCOUNTER — Other Ambulatory Visit (HOSPITAL_COMMUNITY): Payer: Self-pay | Admitting: Internal Medicine

## 2017-02-17 DIAGNOSIS — I2510 Atherosclerotic heart disease of native coronary artery without angina pectoris: Secondary | ICD-10-CM | POA: Diagnosis not present

## 2017-02-17 DIAGNOSIS — Z7984 Long term (current) use of oral hypoglycemic drugs: Secondary | ICD-10-CM | POA: Diagnosis not present

## 2017-02-17 DIAGNOSIS — Z9981 Dependence on supplemental oxygen: Secondary | ICD-10-CM | POA: Diagnosis not present

## 2017-02-17 DIAGNOSIS — Z951 Presence of aortocoronary bypass graft: Secondary | ICD-10-CM | POA: Diagnosis not present

## 2017-02-17 DIAGNOSIS — Z48812 Encounter for surgical aftercare following surgery on the circulatory system: Secondary | ICD-10-CM | POA: Diagnosis not present

## 2017-02-19 DIAGNOSIS — L8990 Pressure ulcer of unspecified site, unspecified stage: Secondary | ICD-10-CM | POA: Diagnosis not present

## 2017-02-19 DIAGNOSIS — Z9981 Dependence on supplemental oxygen: Secondary | ICD-10-CM | POA: Diagnosis not present

## 2017-02-19 DIAGNOSIS — Z48812 Encounter for surgical aftercare following surgery on the circulatory system: Secondary | ICD-10-CM | POA: Diagnosis not present

## 2017-02-19 DIAGNOSIS — I5041 Acute combined systolic (congestive) and diastolic (congestive) heart failure: Secondary | ICD-10-CM | POA: Diagnosis not present

## 2017-02-19 DIAGNOSIS — R269 Unspecified abnormalities of gait and mobility: Secondary | ICD-10-CM | POA: Diagnosis not present

## 2017-02-19 DIAGNOSIS — I2510 Atherosclerotic heart disease of native coronary artery without angina pectoris: Secondary | ICD-10-CM | POA: Diagnosis not present

## 2017-02-19 DIAGNOSIS — Z951 Presence of aortocoronary bypass graft: Secondary | ICD-10-CM | POA: Diagnosis not present

## 2017-02-19 DIAGNOSIS — Z7984 Long term (current) use of oral hypoglycemic drugs: Secondary | ICD-10-CM | POA: Diagnosis not present

## 2017-02-19 DIAGNOSIS — I5033 Acute on chronic diastolic (congestive) heart failure: Secondary | ICD-10-CM | POA: Diagnosis not present

## 2017-02-19 NOTE — Addendum Note (Signed)
Addendum  created 02/19/17 1753 by Annye Asa, MD   Sign clinical note

## 2017-02-20 ENCOUNTER — Other Ambulatory Visit (HOSPITAL_COMMUNITY): Payer: Self-pay

## 2017-02-23 ENCOUNTER — Other Ambulatory Visit (HOSPITAL_COMMUNITY): Payer: Self-pay | Admitting: Internal Medicine

## 2017-02-23 ENCOUNTER — Other Ambulatory Visit (HOSPITAL_COMMUNITY): Payer: Self-pay

## 2017-02-24 ENCOUNTER — Telehealth (HOSPITAL_COMMUNITY): Payer: Self-pay | Admitting: *Deleted

## 2017-02-24 NOTE — Telephone Encounter (Signed)
Spoke with Patty, RN with [facility] she is aware and agreeable.

## 2017-02-24 NOTE — Telephone Encounter (Signed)
Would prefer to keep it twice weekly. Chronic IV support is inherently risky and policy dictates frequent follow up.

## 2017-02-24 NOTE — Telephone Encounter (Signed)
Ecko with [facility] has been seeing the patient twice a week since she is on milrinone.  Patient wants to decrease her visits to weekly unless she is feeling bad or has an issue. She is a [facility] patient but since he is on vacation and Jonni Sanger saw her last I will forward message to [place] for advice.

## 2017-02-25 ENCOUNTER — Other Ambulatory Visit (HOSPITAL_COMMUNITY): Payer: Self-pay

## 2017-02-26 DIAGNOSIS — Z7984 Long term (current) use of oral hypoglycemic drugs: Secondary | ICD-10-CM | POA: Diagnosis not present

## 2017-02-26 DIAGNOSIS — I5033 Acute on chronic diastolic (congestive) heart failure: Secondary | ICD-10-CM | POA: Diagnosis not present

## 2017-02-26 DIAGNOSIS — Z951 Presence of aortocoronary bypass graft: Secondary | ICD-10-CM | POA: Diagnosis not present

## 2017-02-26 DIAGNOSIS — R269 Unspecified abnormalities of gait and mobility: Secondary | ICD-10-CM | POA: Diagnosis not present

## 2017-02-26 DIAGNOSIS — L8990 Pressure ulcer of unspecified site, unspecified stage: Secondary | ICD-10-CM | POA: Diagnosis not present

## 2017-02-26 DIAGNOSIS — I2510 Atherosclerotic heart disease of native coronary artery without angina pectoris: Secondary | ICD-10-CM | POA: Diagnosis not present

## 2017-02-26 DIAGNOSIS — Z9981 Dependence on supplemental oxygen: Secondary | ICD-10-CM | POA: Diagnosis not present

## 2017-02-26 DIAGNOSIS — Z48812 Encounter for surgical aftercare following surgery on the circulatory system: Secondary | ICD-10-CM | POA: Diagnosis not present

## 2017-02-26 DIAGNOSIS — I5041 Acute combined systolic (congestive) and diastolic (congestive) heart failure: Secondary | ICD-10-CM | POA: Diagnosis not present

## 2017-03-03 ENCOUNTER — Other Ambulatory Visit: Payer: Self-pay | Admitting: Internal Medicine

## 2017-03-05 ENCOUNTER — Other Ambulatory Visit (HOSPITAL_COMMUNITY): Payer: Self-pay | Admitting: Internal Medicine

## 2017-03-05 DIAGNOSIS — Z7984 Long term (current) use of oral hypoglycemic drugs: Secondary | ICD-10-CM | POA: Diagnosis not present

## 2017-03-05 DIAGNOSIS — I2510 Atherosclerotic heart disease of native coronary artery without angina pectoris: Secondary | ICD-10-CM | POA: Diagnosis not present

## 2017-03-05 DIAGNOSIS — Z9981 Dependence on supplemental oxygen: Secondary | ICD-10-CM | POA: Diagnosis not present

## 2017-03-05 DIAGNOSIS — I5033 Acute on chronic diastolic (congestive) heart failure: Secondary | ICD-10-CM | POA: Diagnosis not present

## 2017-03-05 DIAGNOSIS — Z48812 Encounter for surgical aftercare following surgery on the circulatory system: Secondary | ICD-10-CM | POA: Diagnosis not present

## 2017-03-05 DIAGNOSIS — I5041 Acute combined systolic (congestive) and diastolic (congestive) heart failure: Secondary | ICD-10-CM | POA: Diagnosis not present

## 2017-03-05 DIAGNOSIS — R269 Unspecified abnormalities of gait and mobility: Secondary | ICD-10-CM | POA: Diagnosis not present

## 2017-03-05 DIAGNOSIS — Z951 Presence of aortocoronary bypass graft: Secondary | ICD-10-CM | POA: Diagnosis not present

## 2017-03-05 DIAGNOSIS — L8990 Pressure ulcer of unspecified site, unspecified stage: Secondary | ICD-10-CM | POA: Diagnosis not present

## 2017-03-06 ENCOUNTER — Telehealth (HOSPITAL_COMMUNITY): Payer: Self-pay

## 2017-03-06 ENCOUNTER — Telehealth (HOSPITAL_COMMUNITY): Payer: Self-pay | Admitting: Cardiology

## 2017-03-06 NOTE — Telephone Encounter (Signed)
PATTY,RN WITH [facility] CALLED TO REQUEST VERBAL ORDER TO RECERT PATIENT WITH HH SERVICES  VERBAL ORDER TO CONTINUE SERVICES GIVEN

## 2017-03-06 NOTE — Telephone Encounter (Signed)
Received call from [facility] asking if we were aware of mag 2.7 drawn and faxed on 7/26. Confirmed we did received this faxed lab report and was signed off by [name] NP with repeat bmet in 1 week.  Renee Pain, RN

## 2017-03-09 MED FILL — glipiZIDE 5 MG TABS: 5 | 30 days supply | Qty: 30 | Fill #3

## 2017-03-10 ENCOUNTER — Other Ambulatory Visit (HOSPITAL_COMMUNITY): Payer: Self-pay | Admitting: *Deleted

## 2017-03-10 ENCOUNTER — Ambulatory Visit (HOSPITAL_COMMUNITY)
Admission: RE | Admit: 2017-03-10 | Discharge: 2017-03-10 | Disposition: A | Discharge status: Home / Self Care | Payer: PPO | Source: Ambulatory Visit | Attending: Cardiology | Admitting: Cardiology

## 2017-03-10 ENCOUNTER — Ambulatory Visit (INDEPENDENT_AMBULATORY_CARE_PROVIDER_SITE_OTHER): Payer: PPO | Admitting: *Deleted

## 2017-03-10 ENCOUNTER — Encounter (HOSPITAL_COMMUNITY): Payer: Self-pay

## 2017-03-10 VITALS — BP 130/88 | HR 65 | Wt 167.2 lb

## 2017-03-10 DIAGNOSIS — I422 Other hypertrophic cardiomyopathy: Secondary | ICD-10-CM | POA: Diagnosis not present

## 2017-03-10 DIAGNOSIS — I951 Orthostatic hypotension: Secondary | ICD-10-CM | POA: Insufficient documentation

## 2017-03-10 DIAGNOSIS — Z951 Presence of aortocoronary bypass graft: Secondary | ICD-10-CM | POA: Diagnosis not present

## 2017-03-10 DIAGNOSIS — Z95810 Presence of automatic (implantable) cardiac defibrillator: Secondary | ICD-10-CM | POA: Diagnosis not present

## 2017-03-10 DIAGNOSIS — Z9119 Patient's noncompliance with other medical treatment and regimen: Secondary | ICD-10-CM | POA: Diagnosis not present

## 2017-03-10 DIAGNOSIS — E119 Type 2 diabetes mellitus without complications: Secondary | ICD-10-CM | POA: Insufficient documentation

## 2017-03-10 DIAGNOSIS — I2510 Atherosclerotic heart disease of native coronary artery without angina pectoris: Secondary | ICD-10-CM

## 2017-03-10 DIAGNOSIS — I5032 Chronic diastolic (congestive) heart failure: Secondary | ICD-10-CM

## 2017-03-10 DIAGNOSIS — I5022 Chronic systolic (congestive) heart failure: Secondary | ICD-10-CM | POA: Diagnosis not present

## 2017-03-10 DIAGNOSIS — I5042 Chronic combined systolic (congestive) and diastolic (congestive) heart failure: Secondary | ICD-10-CM | POA: Diagnosis not present

## 2017-03-10 DIAGNOSIS — Z87891 Personal history of nicotine dependence: Secondary | ICD-10-CM | POA: Insufficient documentation

## 2017-03-10 DIAGNOSIS — N182 Chronic kidney disease, stage 2 (mild): Secondary | ICD-10-CM | POA: Diagnosis not present

## 2017-03-10 DIAGNOSIS — I480 Paroxysmal atrial fibrillation: Secondary | ICD-10-CM | POA: Diagnosis not present

## 2017-03-10 DIAGNOSIS — I252 Old myocardial infarction: Secondary | ICD-10-CM | POA: Insufficient documentation

## 2017-03-10 DIAGNOSIS — Z9889 Other specified postprocedural states: Secondary | ICD-10-CM | POA: Diagnosis not present

## 2017-03-10 DIAGNOSIS — Z7984 Long term (current) use of oral hypoglycemic drugs: Secondary | ICD-10-CM | POA: Insufficient documentation

## 2017-03-10 DIAGNOSIS — Z7901 Long term (current) use of anticoagulants: Secondary | ICD-10-CM | POA: Diagnosis not present

## 2017-03-10 DIAGNOSIS — I509 Heart failure, unspecified: Secondary | ICD-10-CM | POA: Diagnosis not present

## 2017-03-10 LAB — CUP PACEART REMOTE DEVICE CHECK
Date Time Interrogation Session: 20180814092233
Implantable Lead Implant Date: 20160912
Implantable Lead Location: 753860
MDC IDC LEAD IMPLANT DT: 20160912
MDC IDC LEAD LOCATION: 753859
MDC IDC PG IMPLANT DT: 20160912
MDC IDC PG SERIAL: 7279969

## 2017-03-10 LAB — BASIC METABOLIC PANEL
ANION GAP: 10 (ref 5–15)
BUN: 32 mg/dL — AB (ref 6–20)
CALCIUM: 8.6 mg/dL — AB (ref 8.9–10.3)
CO2: 21 mmol/L — AB (ref 22–32)
CREATININE: 1.28 mg/dL — AB (ref 0.44–1.00)
Chloride: 98 mmol/L — ABNORMAL LOW (ref 101–111)
GFR calc non Af Amer: 42 mL/min — ABNORMAL LOW (ref >60)
GFR, EST AFRICAN AMERICAN: 48 mL/min — AB (ref >60)
Glucose, Bld: 407 mg/dL — ABNORMAL HIGH (ref 65–99)
Potassium: 3.9 mmol/L (ref 3.5–5.1)
SODIUM: 129 mmol/L — AB (ref 135–145)

## 2017-03-10 LAB — COOXEMETRY PANEL
Carboxyhemoglobin: 0.9 % (ref 0.5–1.5)
Methemoglobin: 1.2 % (ref 0.0–1.5)
O2 SAT: 61.2 %
Total hemoglobin: 10.2 g/dL — ABNORMAL LOW (ref 12.0–16.0)

## 2017-03-10 MED ORDER — LOSARTAN POTASSIUM 25 MG PO TABS: 12.5000 mg | ORAL_TABLET | Freq: Every day | ORAL | 6 refills | Status: DC

## 2017-03-10 MED ORDER — APIXABAN 5 MG PO TABS: 5.0000 mg | ORAL_TABLET | Freq: Two times a day (BID) | ORAL | 3 refills | Status: DC

## 2017-03-10 MED FILL — LOSARTAN POTASSIUM 25 MG TA: 25 | 30 days supply | Qty: 15 | Fill #0

## 2017-03-10 MED FILL — ELIQUIS 5 MG TABLET: 5 | 30 days supply | Qty: 60 | Fill #0

## 2017-03-10 NOTE — Progress Notes (Signed)
Advanced Heart Failure Medication Review by a Pharmacist  Does the patient  feel that his/her medications are working for him/her?  yes  Has the patient been experiencing any side effects to the medications prescribed?  no  Does the patient measure his/her own blood pressure or blood glucose at home?  no   Does the patient have any problems obtaining medications due to transportation or finances?   no  Understanding of regimen: good Understanding of indications: good Potential of compliance: good Patient understands to avoid NSAIDs. Patient understands to avoid decongestants.  Issues to address at subsequent visits: None   Pharmacist comments: [name] is a pleasant 71 yo F presenting with her daughter and without a medication list but with good recall of her regimen. She reports good compliance with her regimen and did not have any specific medication-related questions or concerns for me at this time.   [name], PharmD, BCPS, CPP Clinical Pharmacist Pager: [phone] Phone: [phone] 03/10/2017 11:17 AM      Time with patient: 10 minutes Preparation and documentation time: 2 minutes Total time: 12 minutes

## 2017-03-10 NOTE — Progress Notes (Signed)
Patient ID: [name], female   DOB: 1946/01/10, 71 y.o.   MRN: [id number]    Advanced Heart Failure Clinic Note    Primary HF Cardiologist: Dr Haroldine Laws EP: Dr Lovena Le.  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 Jude ICD 04/2015, & chronic diastolic CHF (EF 42-59% by echo, 45-50% by cath).   Admitted 09/17/15 with new rapid aflutter (previously PAF/VT) with plans for cardioversion. She was also noted to be volume overload in setting of non-compliance (thought to be dietary). Had TEE/DC-CV with return to NSR. She developed laryngospasm and required brief intubation but recovered in PACU post procedure. Enrolled in the ReDS vest trial,  Reading 53%. She continued to diurese with IV lasix and transitioned to lasix 40 mg twice a day. On the day of discharge Reds Vest reading was 39. Discharge weight was 160 pounds.   Admitted 11/27/16-01/08/17 with VF shocked appropriately by ICD. Underwent cath that showed severe 3 vessel disease She underwent CABG x 4, MAZE, and LAA clip. Post operatively developed AF with RVR and low output heart failure, initial co ox was 36%.Converted to NSR on amio. She was started on milrinone but unable to wean off successfully due to recurrent low output HF. Echo showed EF 45-50% with mild RV hypokinesis.  RHC performed on milrinone which showed primarily RV failure. Suspicion for amyloid raised. SPEP negative. Technetium pyrophosphate scan negative for TTR amyloid. Sent home on milrinone 0.375 mcg. Also had e. Coli UTI and treated with abx.  Pt presents today for follow up. She is feeling great overall. She has missed several follow ups due to car problems. Milrinone decreased at last visit, no coox since then. Labs have been stable at home check. Denies bleeding on  Eliquis. Still with mild lightheadedness in morning, prior to taking her medications. Appetite has much improved. Weight up 5 lbs since last visit. No fevers, chills, or redness around PICC site.   Labs 01/22/17 Creatinine 1.26  Labs 02/05/17 Creatinine 1.5; K 3.5  ICD interrogation: Thoracic impedence depressed. No AT/AF alert. AF burden 13%  RHC 01/02/17  Done on milrinone 0.168mcg/kg/min  RA = 10  RV = 40/15 PA = 42/15 (28) PCW = 16 Fick cardiac output/index = 6.1/3.3 Thermo CO/CI = 5.0/2.7 PVR = 2.4 FA sat = 96% PA sat = 63%. 64%   09/2015 TEE EF 35-40% No evidence of thrombus  09/18/2015 ECHO EF 45-50%   Labs 09/24/2015: K 4.1 Creatinine 1.33   Review of systems complete and found to be negative unless listed in HPI.    SH:  Social History   Social History  . Marital status: Divorced    Spouse name: N/A  . Number of children: N/A  . Years of education: N/A   Occupational History  . Not on file.   Social History Main Topics  . Smoking status: Former Smoker    Years: 4.00    Quit date: 12/31/1980  . Smokeless tobacco: Never Used  . Alcohol use No  . Drug use: No  . Sexual activity: Not Currently   Other Topics Concern  . Not on file   Social History Narrative   Divorced.  Lives with daughter and grandson. 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    FH:  Family History  Problem Relation  Age of Onset  . Breast cancer Mother   . Diabetes Father   . Stroke Father   . Heart disease Sister   . Healthy Sister     Past Medical History:  Diagnosis Date  . Abnormal PFT   . CAD (coronary artery disease), native coronary artery - 3 vessel 04/21/2015   a. NSTEMI 8-04/2015 felt 2/2 demand ischemia. b. 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  . Chronic diastolic CHF (congestive heart failure) ([place])   . Diabetes mellitus ([place])   . Diabetes mellitus without complication ([place])    dx on wed  . Hypertrophic  cardiomyopathy ([place])   . Left atrial enlargement   . Mitral regurgitation    a. Echo 04/2015: moderate mitral regurgitation. b. F/u echo 05/2015: mild MR.  . NSTEMI (non-ST elevated myocardial infarction) ([place]) 11/27/2016  . Paroxysmal atrial fibrillation ([name])   . Shortness of breath dyspnea   . Tricuspid regurgitation    a. Echo 04/2015: Mod-severe TR. b. Not mentioned on echo 05/2015  . Ventricular tachycardia ([place])    a. s/p St. Jude ICD implanted 04/2015.    Current Outpatient Prescriptions  Medication Sig Dispense Refill  . amiodarone (PACERONE) 200 MG tablet Take 1 tablet (200 mg total) by mouth daily. 30 tablet 6  . apixaban (ELIQUIS) 5 MG TABS tablet Take 1 tablet (5 mg total) by mouth 2 (two) times daily. 60 tablet 1  . atorvastatin (LIPITOR) 40 MG tablet Take 1 tablet (40 mg total) by mouth daily at 6 PM. 30 tablet 1  . Blood Glucose Monitoring Suppl (TRUE METRIX METER) DEVI 1 each by Does not apply route 2 (two) times daily. 1 Device 0  . folic acid (FOLVITE) 1 MG tablet Take 1 tablet (1 mg total) by mouth daily. 30 tablet 1  . furosemide (LASIX) 40 MG tablet Take 40 mg by mouth daily.    [name] glipiZIDE (GLUCOTROL) 5 MG tablet Take 1 tablet (5 mg total) by mouth daily. 90 tablet 1  . glucose blood (TRUE METRIX BLOOD GLUCOSE TEST) test strip Use as instructed twice daily 100 each 12  . milrinone (PRIMACOR) 20 MG/100 ML SOLN infusion Inject 9.1875 mcg/min into the vein continuous. 100 mL 10  . ondansetron (ZOFRAN-ODT) 4 MG disintegrating tablet Take 1 tablet (4 mg total) by mouth every 8 (eight) hours as needed for nausea or vomiting. 20 tablet 0  . TRUEPLUS LANCETS 28G MISC Use as instructed twice daily. 60 each 5  . ranitidine (ZANTAC 75) 75 MG tablet Take 1 tablet (75 mg total) by mouth 2 (two) times daily as needed for heartburn. (Patient not taking: Reported on 03/10/2017)     No current facility-administered medications for this encounter.    Vitals:   03/10/17 1110  BP:  130/88  Pulse: 65  SpO2: 98%  Weight: 167 lb 3.2 oz (75.8 kg)   Wt Readings from Last 3 Encounters:  03/10/17 167 lb 3.2 oz (75.8 kg)  02/10/17 162 lb (73.5 kg)  02/10/17 162 lb 6 oz (73.7 kg)   General: Well appearing. No resp difficulty. HEENT: Normal Neck: Supple. JVP 7-8. Carotids 2+ bilat; no bruits. No thyromegaly or nodule noted. Cor: PMI nondisplaced. RRR, 2/6 TR, no S3.  Lungs: CTAB, normal effort. Abdomen: Soft, non-tender, non-distended, no HSM. No bruits or masses. +BS  Extremities: No cyanosis, clubbing, rash, R and LLE no edema.  RUE PICC site stable without drainage or erythema.  Neuro:  Alert & orientedx3, cranial nerves grossly intact. moves all 4 extremities w/o difficulty. Affect pleasant    ASSESSMENT & PLAN: 1. 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 EF 45-50% post CABG 5/18 - She is now s/p VF arrest and CABG in 4/18. Prolonged post-hospital course with low output HF despite relatively preserved EF. RHC suggestive of primarily RV failure.  - Technetium pyrophosphate scan negative for amyloid. - Feeling great. Milrinone at 0.125 mcg/kg/min. Coox today. May be able to stop.  - Volume status looks OK on exam.  - Add losartan 12.5 mg qhs. BMET today. Recheck next week with [facility].  - Continue lasix 40 mg daily. Can take extra 40 mg lasix as needed.  - Developed significant nausea and numbness around her lips and mouth after starting digoxin. Now off. Would not re-challenge. - Labs today.  - Reinforced fluid restriction to < 2 L daily, sodium restriction to less than 2000 mg daily, and the importance of daily weights.   2. PAF - S/P DC-CV 09/20/15. - had recurrent AF post-CABG.  - Remains in NSR on amio. Continue amiodarone 200 mg daily.   - Continue Eliquis. No bleeding.  - AFIB burden 24% -> 19%-> 13%. No AF/AT alert since 01/2017 3. CAD - 3V CAD s/p CABG. Maze and LAA clipping 4/18 - Continue statin. No ASA with Eliquis. No change.   4. H/O VT with ICD placed  04/2015. - s/p VF arrest 4/18 with ICD shock. No change. 5. DM2  - Per PCP 6. H/o orthostatic hypotension - Resolved. Med titration cautiously.   Coox and BMET today. Will cautiously add low dose losartan and attempt to wean Milrinone if coox stable. Will need repeat coox next week if able to wean.   Shirley Friar, PA-C  11:31 AM  Greater than 50% of the 25 minute visit was spent in counseling/coordination of care regarding disease state education, sliding scale diuretics, salt/fluid restriction, and medication reconciliation.

## 2017-03-10 NOTE — Patient Instructions (Signed)
Routine lab work today. Will notify you of abnormal results, otherwise no news is good news!  Will call you to advise whether milrinone can be reduced.  START Losartan 12.5 mg (1/2 tablet) once daily.  Take extra lasix tablet once today OR tomorrow (your preference).  Follow up 4 weeks with Oda Kilts PA-C.  Follow up 2 months with Dr. Haroldine Laws.  Take all medication as prescribed the day of your appointment. Bring all medications with you to your appointment.  Do the following things EVERYDAY: 1) Weigh yourself in the morning before breakfast. Write it down and keep it in a log. 2) Take your medicines as prescribed 3) Eat low salt foods-Limit salt (sodium) to 2000 mg per day.  4) Stay as active as you can everyday 5) Limit all fluids for the day to less than 2 liters

## 2017-03-10 NOTE — Progress Notes (Signed)
Remote ICD transmission.   

## 2017-03-11 ENCOUNTER — Telehealth (HOSPITAL_COMMUNITY): Payer: Self-pay

## 2017-03-11 ENCOUNTER — Encounter: Payer: Self-pay | Admitting: Cardiology

## 2017-03-11 NOTE — Telephone Encounter (Signed)
Basic metabolic panel  Order: [id number]  Status:  Final result Visible to patient:  No (Not Released) Dx:  Chronic diastolic CHF (congestive hea...  Notes recorded by Effie Berkshire, RN on 03/11/2017 at 1:03 PM EDT Patient aware and agreeable. Order faxed to [facility] to stop mil, keep picc. Patient scheduled for 8/15 coox and bmet and educated on importance of compliance with this lab apt. ------  Notes recorded by Kerry Dory, CMA on 03/11/2017 at 11:53 AM EDT Left message for patient to call back. [phone] (M) ------  Notes recorded by Shirley Friar, PA-C on 03/11/2017 at 9:52 AM EDT See below ------  Notes recorded by Shirley Friar, PA-C on 03/10/2017 at 12:37 PM EDT OK to stop milrinone.   Leave PICC line in to repeat repeat COOX and BMET next week. If she cannot come next week, need to postpone stopping milrinone.   BMET stable.   Legrand Como [address] [place], [place] 03/10/2017 12:37 PM

## 2017-03-12 DIAGNOSIS — Z951 Presence of aortocoronary bypass graft: Secondary | ICD-10-CM | POA: Diagnosis not present

## 2017-03-12 DIAGNOSIS — I2510 Atherosclerotic heart disease of native coronary artery without angina pectoris: Secondary | ICD-10-CM | POA: Diagnosis not present

## 2017-03-12 DIAGNOSIS — Z7984 Long term (current) use of oral hypoglycemic drugs: Secondary | ICD-10-CM | POA: Diagnosis not present

## 2017-03-12 DIAGNOSIS — I5033 Acute on chronic diastolic (congestive) heart failure: Secondary | ICD-10-CM | POA: Diagnosis not present

## 2017-03-12 DIAGNOSIS — Z9981 Dependence on supplemental oxygen: Secondary | ICD-10-CM | POA: Diagnosis not present

## 2017-03-12 DIAGNOSIS — L8990 Pressure ulcer of unspecified site, unspecified stage: Secondary | ICD-10-CM | POA: Diagnosis not present

## 2017-03-12 DIAGNOSIS — R269 Unspecified abnormalities of gait and mobility: Secondary | ICD-10-CM | POA: Diagnosis not present

## 2017-03-12 DIAGNOSIS — Z48812 Encounter for surgical aftercare following surgery on the circulatory system: Secondary | ICD-10-CM | POA: Diagnosis not present

## 2017-03-13 ENCOUNTER — Other Ambulatory Visit (HOSPITAL_COMMUNITY): Payer: Self-pay | Admitting: Internal Medicine

## 2017-03-18 ENCOUNTER — Telehealth (HOSPITAL_COMMUNITY): Payer: Self-pay

## 2017-03-18 ENCOUNTER — Encounter: Payer: Self-pay | Admitting: Internal Medicine

## 2017-03-18 ENCOUNTER — Ambulatory Visit (HOSPITAL_COMMUNITY)
Admission: RE | Admit: 2017-03-18 | Discharge: 2017-03-18 | Disposition: A | Discharge status: Home / Self Care | Payer: PPO | Source: Ambulatory Visit | Attending: Internal Medicine | Admitting: Internal Medicine

## 2017-03-18 DIAGNOSIS — I5032 Chronic diastolic (congestive) heart failure: Secondary | ICD-10-CM | POA: Diagnosis not present

## 2017-03-18 LAB — BASIC METABOLIC PANEL
Anion gap: 8 (ref 5–15)
BUN: 45 mg/dL — AB (ref 6–20)
CALCIUM: 8.3 mg/dL — AB (ref 8.9–10.3)
CO2: 23 mmol/L (ref 22–32)
CREATININE: 1.44 mg/dL — AB (ref 0.44–1.00)
Chloride: 111 mmol/L (ref 101–111)
GFR, EST AFRICAN AMERICAN: 42 mL/min — AB (ref >60)
GFR, EST NON AFRICAN AMERICAN: 36 mL/min — AB (ref >60)
Glucose, Bld: 110 mg/dL — ABNORMAL HIGH (ref 65–99)
Potassium: 4.2 mmol/L (ref 3.5–5.1)
SODIUM: 142 mmol/L (ref 135–145)

## 2017-03-18 LAB — COOXEMETRY PANEL
Carboxyhemoglobin: 0.7 % (ref 0.5–1.5)
Methemoglobin: 1.3 % (ref 0.0–1.5)
O2 Saturation: 56.7 %
TOTAL HEMOGLOBIN: 10.2 g/dL — AB (ref 12.0–16.0)

## 2017-03-18 NOTE — Telephone Encounter (Signed)
Basic metabolic panel  Order: [id number]  Status:  Final result Visible to patient:  No (Not Released) Dx:  Chronic diastolic CHF (congestive hea...  Notes recorded by Effie Berkshire, RN on 03/18/2017 at 4:59 PM EDT LVMTCB. ------  Notes recorded by Shirley Friar, PA-C on 03/18/2017 at 3:58 PM EDT Discussed with Dr. Haroldine Laws. Pull PICC line.    Please recheck BMET next week to make sure she remains stable.    Legrand Como [address]" [place], [place] 03/18/2017 3:57 PM

## 2017-03-19 ENCOUNTER — Telehealth (HOSPITAL_COMMUNITY): Payer: Self-pay

## 2017-03-19 DIAGNOSIS — I2510 Atherosclerotic heart disease of native coronary artery without angina pectoris: Secondary | ICD-10-CM | POA: Diagnosis not present

## 2017-03-19 DIAGNOSIS — I5022 Chronic systolic (congestive) heart failure: Secondary | ICD-10-CM

## 2017-03-19 DIAGNOSIS — Z48812 Encounter for surgical aftercare following surgery on the circulatory system: Secondary | ICD-10-CM | POA: Diagnosis not present

## 2017-03-19 DIAGNOSIS — Z9981 Dependence on supplemental oxygen: Secondary | ICD-10-CM | POA: Diagnosis not present

## 2017-03-19 DIAGNOSIS — Z951 Presence of aortocoronary bypass graft: Secondary | ICD-10-CM | POA: Diagnosis not present

## 2017-03-19 DIAGNOSIS — Z7984 Long term (current) use of oral hypoglycemic drugs: Secondary | ICD-10-CM | POA: Diagnosis not present

## 2017-03-19 NOTE — Addendum Note (Signed)
Addended by: Effie Berkshire on: 03/19/2017 11:50 AM   Modules accepted: Orders

## 2017-03-19 NOTE — Telephone Encounter (Signed)
Basic metabolic panel  Order: [id number]  Status:  Final result Visible to patient:  No (Not Released) Dx:  Chronic diastolic CHF (congestive hea...  Notes recorded by Effie Berkshire, RN on 03/19/2017 at 10:57 AM EDT Patient aware, order faxed to [facility] ------  Notes recorded by Effie Berkshire, RN on 03/18/2017 at 4:59 PM EDT LVMTCB. ------  Notes recorded by Shirley Friar, PA-C on 03/18/2017 at 3:58 PM EDT Discussed with Dr. Haroldine Laws. Pull PICC line.    Please recheck BMET next week to make sure she remains stable.    Legrand Como [address] [place], [place] 03/18/2017 3:57 PM

## 2017-03-26 ENCOUNTER — Ambulatory Visit (HOSPITAL_COMMUNITY)
Admission: RE | Admit: 2017-03-26 | Discharge: 2017-03-26 | Disposition: A | Discharge status: Home / Self Care | Payer: PPO | Source: Ambulatory Visit | Attending: Internal Medicine | Admitting: Internal Medicine

## 2017-03-26 DIAGNOSIS — I5022 Chronic systolic (congestive) heart failure: Secondary | ICD-10-CM | POA: Insufficient documentation

## 2017-03-26 LAB — BASIC METABOLIC PANEL
Anion gap: 7 (ref 5–15)
BUN: 34 mg/dL — AB (ref 6–20)
CALCIUM: 9 mg/dL (ref 8.9–10.3)
CO2: 25 mmol/L (ref 22–32)
Chloride: 108 mmol/L (ref 101–111)
Creatinine, Ser: 1.21 mg/dL — ABNORMAL HIGH (ref 0.44–1.00)
GFR calc Af Amer: 52 mL/min — ABNORMAL LOW (ref >60)
GFR, EST NON AFRICAN AMERICAN: 45 mL/min — AB (ref >60)
GLUCOSE: 121 mg/dL — AB (ref 65–99)
Potassium: 4.3 mmol/L (ref 3.5–5.1)
Sodium: 140 mmol/L (ref 135–145)

## 2017-03-31 ENCOUNTER — Other Ambulatory Visit (HOSPITAL_COMMUNITY): Payer: Self-pay | Admitting: Internal Medicine

## 2017-04-03 MED FILL — glipiZIDE 5 MG TABS: 5 | 30 days supply | Qty: 30 | Fill #4

## 2017-04-03 MED FILL — LOSARTAN POTASSIUM 25 MG TA: 25 | 30 days supply | Qty: 15 | Fill #1

## 2017-04-03 MED FILL — FOLIC ACID 1 MG TABLET: 1 | 30 days supply | Qty: 30 | Fill #1

## 2017-04-03 MED FILL — ELIQUIS 5 MG TABLET: 5 | 30 days supply | Qty: 60 | Fill #1

## 2017-04-07 ENCOUNTER — Encounter (HOSPITAL_COMMUNITY): Payer: Self-pay

## 2017-04-07 ENCOUNTER — Ambulatory Visit (HOSPITAL_COMMUNITY)
Admission: RE | Admit: 2017-04-07 | Discharge: 2017-04-07 | Disposition: A | Discharge status: Home / Self Care | Payer: PPO | Source: Ambulatory Visit | Attending: Cardiology | Admitting: Cardiology

## 2017-04-07 VITALS — BP 132/62 | HR 61 | Wt 171.0 lb

## 2017-04-07 DIAGNOSIS — I4901 Ventricular fibrillation: Secondary | ICD-10-CM | POA: Diagnosis not present

## 2017-04-07 DIAGNOSIS — I2510 Atherosclerotic heart disease of native coronary artery without angina pectoris: Secondary | ICD-10-CM | POA: Diagnosis not present

## 2017-04-07 DIAGNOSIS — I480 Paroxysmal atrial fibrillation: Secondary | ICD-10-CM | POA: Diagnosis not present

## 2017-04-07 DIAGNOSIS — Z87891 Personal history of nicotine dependence: Secondary | ICD-10-CM | POA: Diagnosis not present

## 2017-04-07 DIAGNOSIS — Z95810 Presence of automatic (implantable) cardiac defibrillator: Secondary | ICD-10-CM | POA: Insufficient documentation

## 2017-04-07 DIAGNOSIS — Z79899 Other long term (current) drug therapy: Secondary | ICD-10-CM | POA: Diagnosis not present

## 2017-04-07 DIAGNOSIS — Z7984 Long term (current) use of oral hypoglycemic drugs: Secondary | ICD-10-CM | POA: Insufficient documentation

## 2017-04-07 DIAGNOSIS — I472 Ventricular tachycardia: Secondary | ICD-10-CM | POA: Diagnosis not present

## 2017-04-07 DIAGNOSIS — Z951 Presence of aortocoronary bypass graft: Secondary | ICD-10-CM | POA: Diagnosis not present

## 2017-04-07 DIAGNOSIS — Z7901 Long term (current) use of anticoagulants: Secondary | ICD-10-CM | POA: Insufficient documentation

## 2017-04-07 DIAGNOSIS — I422 Other hypertrophic cardiomyopathy: Secondary | ICD-10-CM | POA: Diagnosis not present

## 2017-04-07 DIAGNOSIS — I255 Ischemic cardiomyopathy: Secondary | ICD-10-CM | POA: Diagnosis not present

## 2017-04-07 DIAGNOSIS — E119 Type 2 diabetes mellitus without complications: Secondary | ICD-10-CM | POA: Diagnosis not present

## 2017-04-07 DIAGNOSIS — I5042 Chronic combined systolic (congestive) and diastolic (congestive) heart failure: Secondary | ICD-10-CM | POA: Insufficient documentation

## 2017-04-07 DIAGNOSIS — I252 Old myocardial infarction: Secondary | ICD-10-CM | POA: Diagnosis not present

## 2017-04-07 DIAGNOSIS — Z9889 Other specified postprocedural states: Secondary | ICD-10-CM | POA: Insufficient documentation

## 2017-04-07 DIAGNOSIS — I5022 Chronic systolic (congestive) heart failure: Secondary | ICD-10-CM | POA: Diagnosis not present

## 2017-04-07 MED ORDER — LOSARTAN POTASSIUM 25 MG PO TABS: 25.0000 mg | ORAL_TABLET | Freq: Every day | ORAL | 6 refills | Status: DC

## 2017-04-07 NOTE — Progress Notes (Signed)
Patient ID: [name], female   DOB: 11-10-1945, 72 y.o.   MRN: [id number]    Advanced Heart Failure Clinic Note    Primary HF Cardiologist: Dr Haroldine Laws EP: Dr Lovena Le.  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 Jude ICD 04/2015, & chronic diastolic CHF (EF 76-16% by echo, 45-50% by cath).   Admitted 09/17/15 with new rapid aflutter (previously PAF/VT) with plans for cardioversion. She was also noted to be volume overload in setting of non-compliance (thought to be dietary). Had TEE/DC-CV with return to NSR. She developed laryngospasm and required brief intubation but recovered in PACU post procedure. Enrolled in the ReDS vest trial,  Reading 53%. She continued to diurese with IV lasix and transitioned to lasix 40 mg twice a day. On the day of discharge Reds Vest reading was 39. Discharge weight was 160 pounds.   Admitted 11/27/16-01/08/17 with VF shocked appropriately by ICD. Underwent cath that showed severe 3 vessel disease She underwent CABG x 4, MAZE, and LAA clip. Post operatively developed AF with RVR and low output heart failure, initial co ox was 36%.Converted to NSR on amio. She was started on milrinone but unable to wean off successfully due to recurrent low output HF. Echo showed EF 45-50% with mild RV hypokinesis.  RHC performed on milrinone which showed primarily RV failure. Suspicion for amyloid raised. SPEP negative. Technetium pyrophosphate scan negative for TTR amyloid. Sent home on milrinone 0.375 mcg. Also had e. Coli UTI and treated with abx.  Home milrinone weaned off and PICC pulled in 03/2017.   She returns today for HF follow up. Feeling well overall, has more energy recently. Denies SOB, chest pain. Denies orthopnea. Weights at home are 168-171 pounds. She has not  increased her activity much due to right leg pain which she says is chronic from previous falls. She denies dizziness.   Weight 168-171 pounds. Not SOB. No dizziness Drinking less than 2L a day.   Labs 01/22/17 Creatinine 1.26  Labs 02/05/17 Creatinine 1.5; K 3.5  ICD interrogation: Thoracic impedance low, no VT.   [name] 01/02/17  Done on milrinone 0.191mcg/kg/min  RA = 10  RV = 40/15 PA = 42/15 (28) PCW = 16 Fick cardiac output/index = 6.1/3.3 Thermo CO/CI = 5.0/2.7 PVR = 2.4 FA sat = 96% PA sat = 63%. 64%   09/2015 TEE EF 35-40% No evidence of thrombus  09/18/2015 ECHO EF 45-50%   Labs 09/24/2015: K 4.1 Creatinine 1.33   Review of systems complete and found to be negative unless listed in HPI.    SH:  Social History   Social History  . Marital status: Divorced    Spouse name: N/A  . Number of children: N/A  . Years of education: N/A   Occupational History  . Not on file.   Social History Main Topics  . Smoking status: Former Smoker    Years: 4.00    Quit date: 12/31/1980  . Smokeless tobacco: Never Used  . Alcohol use No  . Drug use: No  . Sexual activity: Not Currently   Other Topics Concern  . Not on file   Social History Narrative   Divorced.  Lives with daughter and grandson. 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    FH:  Family History  Problem  Relation Age of Onset  . Breast cancer Mother   . Diabetes Father   . Stroke Father   . Heart disease Sister   . Healthy Sister     Past Medical History:  Diagnosis Date  . Abnormal PFT   . CAD (coronary artery disease), native coronary artery - 3 vessel 04/21/2015   a. NSTEMI 8-04/2015 felt 2/2 demand ischemia. b. 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  . Chronic diastolic CHF (congestive heart failure) ([place])   . Diabetes mellitus ([place])   . Diabetes mellitus without complication ([place])    dx on wed  . Hypertrophic cardiomyopathy ([place])   . Left  atrial enlargement   . Mitral regurgitation    a. Echo 04/2015: moderate mitral regurgitation. b. F/u echo 05/2015: mild MR.  . NSTEMI (non-ST elevated myocardial infarction) ([place]) 11/27/2016  . Paroxysmal atrial fibrillation ([name])   . Shortness of breath dyspnea   . Tricuspid regurgitation    a. Echo 04/2015: Mod-severe TR. b. Not mentioned on echo 05/2015  . Ventricular tachycardia ([place])    a. s/p St. Jude ICD implanted 04/2015.    Current Outpatient Prescriptions  Medication Sig Dispense Refill  . amiodarone (PACERONE) 200 MG tablet Take 1 tablet (200 mg total) by mouth daily. 30 tablet 6  . apixaban (ELIQUIS) 5 MG TABS tablet Take 1 tablet (5 mg total) by mouth 2 (two) times daily. 60 tablet 3  . atorvastatin (LIPITOR) 40 MG tablet Take 1 tablet (40 mg total) by mouth daily at 6 PM. 30 tablet 1  . Blood Glucose Monitoring Suppl (TRUE METRIX METER) DEVI 1 each by Does not apply route 2 (two) times daily. 1 Device 0  . folic acid (FOLVITE) 1 MG tablet Take 1 tablet (1 mg total) by mouth daily. 30 tablet 1  . furosemide (LASIX) 40 MG tablet Take 40 mg by mouth daily.    [name] glipiZIDE (GLUCOTROL) 5 MG tablet Take 1 tablet (5 mg total) by mouth daily. 90 tablet 1  . glucose blood (TRUE METRIX BLOOD GLUCOSE TEST) test strip Use as instructed twice daily 100 each 12  . losartan (COZAAR) 25 MG tablet Take 0.5 tablets (12.5 mg total) by mouth daily. 15 tablet 6  . milrinone (PRIMACOR) 20 MG/100 ML SOLN infusion Inject 9.1875 mcg/min into the vein continuous. 100 mL 10  . ondansetron (ZOFRAN-ODT) 4 MG disintegrating tablet Take 1 tablet (4 mg total) by mouth every 8 (eight) hours as needed for nausea or vomiting. 20 tablet 0  . ranitidine (ZANTAC 75) 75 MG tablet Take 1 tablet (75 mg total) by mouth 2 (two) times daily as needed for heartburn.    . TRUEPLUS LANCETS 28G MISC Use as instructed twice daily. 60 each 5   No current facility-administered medications for this encounter.    Vitals:    04/07/17 1146  BP: 132/62  Pulse: 61  SpO2: 95%  Weight: 171 lb (77.6 kg)   Wt Readings from Last 3 Encounters:  04/07/17 171 lb (77.6 kg)  03/10/17 167 lb 3.2 oz (75.8 kg)  02/10/17 162 lb (73.5 kg)   General: Well appearing. No resp difficulty. HEENT: Normal Neck: Supple. JVP 5-6. Carotids 2+ bilat; no bruits. No thyromegaly or nodule noted. Cor: PMI nondisplaced. RRR, No M/G/R noted Lungs: CTAB, normal effort. Abdomen: Soft, non-tender, non-distended, no HSM. No bruits or masses. +BS  Extremities: No cyanosis, clubbing, rash, R and LLE no edema.  Neuro: Alert &  orientedx3, cranial nerves grossly intact. moves all 4 extremities w/o difficulty. Affect pleasant   ASSESSMENT & PLAN: 1. 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 EF 45-50% post CABG 5/18 - She is now s/p VF arrest and CABG in 4/18. Prolonged post-hospital course with low output HF despite relatively preserved EF. RHC suggestive of primarily RV failure.  - Technetium pyrophosphate scan negative for amyloid. - Milrinone stopped.  - Volume stable on exam and confirmed by [facility].  - Increase losartan to 25 mg daily.  - No beta blocker yet, discussed with Dr. Haroldine Laws.  - Continue lasix 40 mg daily.  - Developed significant nausea and numbness around her lips and mouth after starting digoxin. Now off. Would not re-challenge. - BMET today.   2. PAF - S/P DC-CV 09/20/15. - No Afib by device interrogation.  - Continue Eliquis for anticoagulation.   3. CAD - 3V CAD s/p CABG. Maze and LAA clipping 4/18 - Denies chest pain. No ASA with need for Eliquis.  4. H/O VT with ICD placed 04/2015. - s/p VF arrest 4/18 with ICD shock.  - No further.   5. DM2  - Follows with PCP.   Follow up with Dr. Haroldine Laws as scheduled.    Arbutus Leas, NP  11:51 AM

## 2017-04-07 NOTE — Patient Instructions (Signed)
INCREASE losartan to 25 mg (1 whole tablet) once daily.  Follow up as scheduled with Dr. Haroldine Laws. Take all medication as prescribed the day of your appointment. Bring all medications with you to your appointment.  Do the following things EVERYDAY: 1) Weigh yourself in the morning before breakfast. Write it down and keep it in a log. 2) Take your medicines as prescribed 3) Eat low salt foods-Limit salt (sodium) to 2000 mg per day.  4) Stay as active as you can everyday 5) Limit all fluids for the day to less than 2 liters

## 2017-04-10 ENCOUNTER — Telehealth (HOSPITAL_COMMUNITY): Payer: Self-pay

## 2017-04-10 NOTE — Telephone Encounter (Signed)
I called and left message on voicemail to call office about scheduling for cardiac rehab. I left office contact information on patient voicemail to return call.  ° °

## 2017-04-21 MED FILL — AMIODARONE HCL 200 MG TAB: 200 | 30 days supply | Qty: 60 | Fill #1

## 2017-04-29 ENCOUNTER — Encounter (HOSPITAL_COMMUNITY): Payer: Self-pay

## 2017-04-29 NOTE — Addendum Note (Signed)
Addendum  created 04/29/17 0711 by Duane Boston, MD   Sign clinical note

## 2017-05-05 ENCOUNTER — Other Ambulatory Visit: Payer: Self-pay | Admitting: *Deleted

## 2017-05-05 NOTE — Patient Outreach (Signed)
[facility]) Care Management  05/05/2017  LETRICIA KRINSKY May 10, 1946 [id number]  Telephone Screen  Referral Date: 05/04/17 Referral Source: HTA Referral Reason: History of Heart Failure In June 2018, she declined our services in the community, when she was overwhelmed. Insurance: HTA  Outreach attempt #1 to patient. No answer. RN CM left HIPAA compliant message along with contact info.    Plan: RN CM will make outreach attempt to patient within a month.  Lake Bells, RN, BSN, MHA/MSL, [place] Telephonic Care Manager Coordinator [facility] Direct Phone: [phone] Toll Free: [phone] Fax: [phone]

## 2017-05-07 MED FILL — ELIQUIS 5 MG TABLET: 5 | 30 days supply | Qty: 60 | Fill #2

## 2017-05-07 MED FILL — glipiZIDE 5 MG TABS: 5 | 30 days supply | Qty: 30 | Fill #5

## 2017-05-07 MED FILL — LOSARTAN POTASSIUM 25 MG TA: 25 | 30 days supply | Qty: 15 | Fill #2

## 2017-05-11 ENCOUNTER — Encounter (HOSPITAL_COMMUNITY): Payer: Self-pay | Admitting: Internal Medicine

## 2017-05-12 ENCOUNTER — Other Ambulatory Visit: Payer: Self-pay | Admitting: *Deleted

## 2017-05-12 ENCOUNTER — Ambulatory Visit: Payer: Self-pay | Admitting: *Deleted

## 2017-05-12 NOTE — Patient Outreach (Signed)
[facility]) Care Management  05/12/2017  [name] [birth year] [id number]   Telephone Screen  Referral Date: 05/04/17 Referral Source: HTA Referral Reason: History of Heart Failure In June 2018, she declined our services in the community, when she was overwhelmed. Insurance: HTA  Outreach attempt # 2 to patient. No answer. RN CM left HIPAA compliant message along with contact info.   Plan: RN CM will make outreach attempt to patient within a month.  Lake Bells, RN, BSN, MHA/MSL, [place] Telephonic Care Manager Coordinator [facility] Direct Phone: [phone] Toll Free: [phone] Fax: [phone]

## 2017-05-15 ENCOUNTER — Encounter: Payer: Self-pay | Admitting: *Deleted

## 2017-05-15 ENCOUNTER — Ambulatory Visit: Payer: Self-pay | Admitting: *Deleted

## 2017-05-15 ENCOUNTER — Other Ambulatory Visit: Payer: Self-pay | Admitting: *Deleted

## 2017-05-15 NOTE — Patient Outreach (Signed)
[facility]) Care Management  05/15/2017  [name] [birth year] [id number]   Telephone Screen  Referral Date: 05/04/17 Referral Source: HTA Referral Reason: History of Heart Failure In June 2018, she declined our services in the community, when she was overwhelmed. Insurance: HTA  Outreach attempt #3 to patient. No answer. RN CM left HIPAA compliant message along with contact info.   Plan: RN CM will send unsuccessful outreach letter to patient and will close case if no response from patient within 10 business days.  Lake Bells, RN, BSN, MHA/MSL, [place] Telephonic Care Manager Coordinator [facility] Direct Phone: [phone] Toll Free: [phone] Fax: [phone]

## 2017-05-26 MED FILL — LOSARTAN POTASSIUM 25 MG TA: 25 | 30 days supply | Qty: 30 | Fill #0

## 2017-05-26 MED FILL — FOLIC ACID 1 MG TABLET: 1 | 30 days supply | Qty: 30 | Fill #0

## 2017-05-29 ENCOUNTER — Encounter: Payer: Self-pay | Admitting: *Deleted

## 2017-05-29 ENCOUNTER — Other Ambulatory Visit: Payer: Self-pay | Admitting: *Deleted

## 2017-05-29 ENCOUNTER — Ambulatory Visit: Payer: Self-pay | Admitting: *Deleted

## 2017-05-29 NOTE — Patient Outreach (Addendum)
[facility]) Care Management  05/29/2017  [name] 11-12-1945 [id number]   Telephone Screen  Referral Date: 05/04/17 Referral Source: HTA Referral Reason: History of Heart Failure In June 2018, she declined our services in the community, when she was overwhelmed. Insurance: HTA  RN CM attempted to reach patient 3 times by phone, unsuccessfully. RN CM left HIPAA compliant message along with contact info each telephone call. RN CM will sent unsuccessful outreach letter to patient on 05/15/17. RN CM will close case due to no response from patient within 10 business days.  Plan:  RN CM will notify [facility] Case Management Assistant regarding case closure.  RN CM will send MD case closure letter. RN CM will send patient case closure letter.    Lake Bells, RN, BSN, MHA/MSL, [place] Telephonic Care Manager Coordinator [facility] Direct Phone: [phone] Toll Free: [phone] Fax: [phone]

## 2017-06-03 ENCOUNTER — Other Ambulatory Visit: Payer: Self-pay | Admitting: Cardiology

## 2017-06-08 ENCOUNTER — Other Ambulatory Visit: Payer: Self-pay | Admitting: Family Medicine

## 2017-06-08 DIAGNOSIS — E119 Type 2 diabetes mellitus without complications: Secondary | ICD-10-CM

## 2017-06-08 MED FILL — ELIQUIS 5 MG TABLET: 5 | 30 days supply | Qty: 60 | Fill #3

## 2017-06-09 ENCOUNTER — Telehealth: Payer: Self-pay | Admitting: Cardiology

## 2017-06-09 ENCOUNTER — Encounter: Payer: PPO | Admitting: *Deleted

## 2017-06-09 NOTE — Telephone Encounter (Signed)
LMOVM reminding pt to send remote transmission.   

## 2017-06-12 ENCOUNTER — Encounter: Payer: Self-pay | Admitting: Cardiology

## 2017-06-15 ENCOUNTER — Other Ambulatory Visit: Payer: Self-pay | Admitting: Family Medicine

## 2017-06-15 DIAGNOSIS — E119 Type 2 diabetes mellitus without complications: Secondary | ICD-10-CM

## 2017-06-19 ENCOUNTER — Other Ambulatory Visit: Payer: Self-pay | Admitting: Family Medicine

## 2017-06-19 DIAGNOSIS — E119 Type 2 diabetes mellitus without complications: Secondary | ICD-10-CM

## 2017-06-24 ENCOUNTER — Other Ambulatory Visit (HOSPITAL_COMMUNITY): Payer: Self-pay | Admitting: *Deleted

## 2017-06-24 DIAGNOSIS — E785 Hyperlipidemia, unspecified: Secondary | ICD-10-CM | POA: Diagnosis not present

## 2017-06-24 DIAGNOSIS — E119 Type 2 diabetes mellitus without complications: Secondary | ICD-10-CM | POA: Diagnosis not present

## 2017-06-24 DIAGNOSIS — I2510 Atherosclerotic heart disease of native coronary artery without angina pectoris: Secondary | ICD-10-CM | POA: Diagnosis not present

## 2017-06-24 DIAGNOSIS — Z23 Encounter for immunization: Secondary | ICD-10-CM | POA: Diagnosis not present

## 2017-06-24 DIAGNOSIS — I10 Essential (primary) hypertension: Secondary | ICD-10-CM | POA: Diagnosis not present

## 2017-06-24 MED ORDER — AMIODARONE HCL 200 MG PO TABS: 200.0000 mg | ORAL_TABLET | Freq: Every day | ORAL | 3 refills | Status: DC

## 2017-06-24 MED ORDER — FOLIC ACID 1 MG PO TABS: 1.0000 mg | ORAL_TABLET | Freq: Every day | ORAL | 1 refills | Status: DC

## 2017-06-30 ENCOUNTER — Other Ambulatory Visit (HOSPITAL_COMMUNITY): Payer: Self-pay

## 2017-06-30 MED ORDER — LOSARTAN POTASSIUM 25 MG PO TABS: 25.0000 mg | ORAL_TABLET | Freq: Every day | ORAL | 0 refills | Status: DC

## 2017-07-03 ENCOUNTER — Other Ambulatory Visit (HOSPITAL_COMMUNITY): Payer: Self-pay | Admitting: Cardiology

## 2017-07-08 ENCOUNTER — Ambulatory Visit (INDEPENDENT_AMBULATORY_CARE_PROVIDER_SITE_OTHER): Payer: PPO | Admitting: *Deleted

## 2017-07-08 ENCOUNTER — Ambulatory Visit (HOSPITAL_COMMUNITY)
Admission: RE | Admit: 2017-07-08 | Discharge: 2017-07-08 | Disposition: A | Discharge status: Home / Self Care | Payer: PPO | Source: Ambulatory Visit | Attending: Internal Medicine | Admitting: Internal Medicine

## 2017-07-08 ENCOUNTER — Other Ambulatory Visit (HOSPITAL_COMMUNITY): Payer: Self-pay | Admitting: Internal Medicine

## 2017-07-08 ENCOUNTER — Other Ambulatory Visit: Payer: Self-pay

## 2017-07-08 VITALS — BP 150/84 | HR 65 | Wt 173.9 lb

## 2017-07-08 DIAGNOSIS — I480 Paroxysmal atrial fibrillation: Secondary | ICD-10-CM | POA: Insufficient documentation

## 2017-07-08 DIAGNOSIS — Z87891 Personal history of nicotine dependence: Secondary | ICD-10-CM | POA: Diagnosis not present

## 2017-07-08 DIAGNOSIS — Z803 Family history of malignant neoplasm of breast: Secondary | ICD-10-CM | POA: Diagnosis not present

## 2017-07-08 DIAGNOSIS — Z951 Presence of aortocoronary bypass graft: Secondary | ICD-10-CM | POA: Diagnosis not present

## 2017-07-08 DIAGNOSIS — I422 Other hypertrophic cardiomyopathy: Secondary | ICD-10-CM | POA: Diagnosis not present

## 2017-07-08 DIAGNOSIS — Z87440 Personal history of urinary (tract) infections: Secondary | ICD-10-CM | POA: Insufficient documentation

## 2017-07-08 DIAGNOSIS — I5042 Chronic combined systolic (congestive) and diastolic (congestive) heart failure: Secondary | ICD-10-CM | POA: Insufficient documentation

## 2017-07-08 DIAGNOSIS — Z833 Family history of diabetes mellitus: Secondary | ICD-10-CM | POA: Insufficient documentation

## 2017-07-08 DIAGNOSIS — I2510 Atherosclerotic heart disease of native coronary artery without angina pectoris: Secondary | ICD-10-CM | POA: Diagnosis not present

## 2017-07-08 DIAGNOSIS — Z95810 Presence of automatic (implantable) cardiac defibrillator: Secondary | ICD-10-CM | POA: Insufficient documentation

## 2017-07-08 DIAGNOSIS — Z8249 Family history of ischemic heart disease and other diseases of the circulatory system: Secondary | ICD-10-CM | POA: Insufficient documentation

## 2017-07-08 DIAGNOSIS — M25561 Pain in right knee: Secondary | ICD-10-CM | POA: Insufficient documentation

## 2017-07-08 DIAGNOSIS — Z79899 Other long term (current) drug therapy: Secondary | ICD-10-CM | POA: Diagnosis not present

## 2017-07-08 DIAGNOSIS — Z823 Family history of stroke: Secondary | ICD-10-CM | POA: Diagnosis not present

## 2017-07-08 DIAGNOSIS — E119 Type 2 diabetes mellitus without complications: Secondary | ICD-10-CM | POA: Diagnosis not present

## 2017-07-08 DIAGNOSIS — Z7984 Long term (current) use of oral hypoglycemic drugs: Secondary | ICD-10-CM | POA: Diagnosis not present

## 2017-07-08 DIAGNOSIS — I5032 Chronic diastolic (congestive) heart failure: Secondary | ICD-10-CM

## 2017-07-08 DIAGNOSIS — I255 Ischemic cardiomyopathy: Secondary | ICD-10-CM | POA: Diagnosis not present

## 2017-07-08 DIAGNOSIS — I252 Old myocardial infarction: Secondary | ICD-10-CM | POA: Insufficient documentation

## 2017-07-08 DIAGNOSIS — Z7901 Long term (current) use of anticoagulants: Secondary | ICD-10-CM | POA: Diagnosis not present

## 2017-07-08 LAB — BASIC METABOLIC PANEL
ANION GAP: 8 (ref 5–15)
BUN: 46 mg/dL — ABNORMAL HIGH (ref 6–20)
CALCIUM: 9.6 mg/dL (ref 8.9–10.3)
CO2: 25 mmol/L (ref 22–32)
Chloride: 108 mmol/L (ref 101–111)
Creatinine, Ser: 1.27 mg/dL — ABNORMAL HIGH (ref 0.44–1.00)
GFR calc non Af Amer: 42 mL/min — ABNORMAL LOW (ref >60)
GFR, EST AFRICAN AMERICAN: 49 mL/min — AB (ref >60)
Glucose, Bld: 113 mg/dL — ABNORMAL HIGH (ref 65–99)
POTASSIUM: 5.4 mmol/L — AB (ref 3.5–5.1)
Sodium: 141 mmol/L (ref 135–145)

## 2017-07-08 MED ORDER — APIXABAN 5 MG PO TABS: 5.0000 mg | ORAL_TABLET | Freq: Two times a day (BID) | ORAL | 3 refills | Status: DC

## 2017-07-08 MED FILL — ELIQUIS 5 MG TABLET: 5 | 30 days supply | Qty: 60 | Fill #0

## 2017-07-08 NOTE — Patient Instructions (Signed)
Stop Amiodarone  Labs drawn today (if we do not call you, then your lab work was stable)   You have been referred to Orthopedic Dr. Ninfa Linden for right knee pain (they will call you)   Your physician recommends that you schedule a follow-up appointment in: 6 months (we will call you)

## 2017-07-08 NOTE — Progress Notes (Signed)
Patient ID: KARRON GOENS, female   DOB: Oct 07, 1945, 71 y.o.   MRN: [id number]    Advanced Heart Failure Clinic Note    Primary HF Cardiologist: Dr Haroldine Laws EP: Dr Lovena Le.  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 Jude ICD 04/2015, & chronic diastolic CHF (EF 08-65% by echo, 45-50% by cath).   Admitted 09/17/15 with new rapid aflutter (previously PAF/VT) with plans for cardioversion. She was also noted to be volume overload in setting of non-compliance (thought to be dietary). Had TEE/DC-CV with return to NSR. She developed laryngospasm and required brief intubation but recovered in PACU post procedure. Enrolled in the ReDS vest trial,  Reading 53%. She continued to diurese with IV lasix and transitioned to lasix 40 mg twice a day. On the day of discharge Reds Vest reading was 39. Discharge weight was 160 pounds.   Admitted 11/27/16-01/08/17 with VF shocked appropriately by ICD. Underwent cath that showed severe 3 vessel disease She underwent CABG x 4, MAZE, and LAA clip. Post operatively developed AF with RVR and low output heart failure, initial co ox was 36%.Converted to NSR on amio. She was started on milrinone but unable to wean off successfully due to recurrent low output HF. Echo showed EF 45-50% with mild RV hypokinesis.  RHC performed on milrinone which showed primarily RV failure. Suspicion for amyloid raised. SPEP negative. Technetium pyrophosphate scan negative for TTR amyloid. Sent home on milrinone 0.375 mcg. Also had e. Coli UTI and treated with abx.  Home milrinone weaned off and PICC pulled in 03/2017.   She returns for f/u. Overall doing well. Has days when she feels like she gets bloated in her belly. Weight stable. No LE edema, orthopnea or PND. Able to do all ADLs. Has O2 but  doesn't use it. No CP.  SBP 120-130 at home. C/o of severe right knee pain and waiting to see orthopedist.  .   Labs 01/22/17 Creatinine 1.26  Labs 02/05/17 Creatinine 1.5; K 3.5  ICD interrogation: Thoracic impedance low, no VT.   [place] 01/02/17  Done on milrinone 0.166mcg/kg/min  RA = 10  RV = 40/15 PA = 42/15 (28) PCW = 16 Fick cardiac output/index = 6.1/3.3 Thermo CO/CI = 5.0/2.7 PVR = 2.4 FA sat = 96% PA sat = 63%. 64%   09/2015 TEE EF 35-40% No evidence of thrombus  09/18/2015 ECHO EF 45-50%   Labs 09/24/2015: K 4.1 Creatinine 1.33   Review of systems complete and found to be negative unless listed in HPI.    SH:  Social History   Socioeconomic History  . Marital status: Divorced    Spouse name: Not on file  . Number of children: Not on file  . Years of education: Not on file  . Highest education level: Not on file  Social Needs  . Financial resource strain: Not on file  . Food insecurity - worry: Not on file  . Food insecurity - inability: Not on file  . Transportation needs - medical: Not on file  . Transportation needs - non-medical: Not on file  Occupational History  . Not on file  Tobacco Use  . Smoking status: Former Smoker    Years: 4.00    Last attempt to quit: 12/31/1980    Years since quitting: 36.5  . Smokeless tobacco:  Never Used  Substance and Sexual Activity  . Alcohol use: No  . Drug use: No  . Sexual activity: Not Currently  Other Topics Concern  . Not on file  Social History Narrative   Divorced.  Lives with daughter and grandson. 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    FH:  Family History  Problem Relation Age of Onset  . Breast cancer Mother   . Diabetes Father   . Stroke Father   . Heart disease Sister   . Healthy Sister     Past Medical History:  Diagnosis Date  . Abnormal PFT   . CAD (coronary artery disease), native coronary artery - 3 vessel 04/21/2015   a. NSTEMI 8-04/2015 felt 2/2 demand ischemia. b. 3V CAD by  cath 04/2015, med rx initially recommended and considering CABG in several months.  . Chronic diastolic CHF (congestive heart failure) ([place])   . Diabetes mellitus ([place])   . Diabetes mellitus without complication ([place])    dx on wed  . Hypertrophic cardiomyopathy ([place])   . Left atrial enlargement   . Mitral regurgitation    a. Echo 04/2015: moderate mitral regurgitation. b. F/u echo 05/2015: mild MR.  . NSTEMI (non-ST elevated myocardial infarction) ([place]) 11/27/2016  . Paroxysmal atrial fibrillation ([name])   . Shortness of breath dyspnea   . Tricuspid regurgitation    a. Echo 04/2015: Mod-severe TR. b. Not mentioned on echo 05/2015  . Ventricular tachycardia ([place])    a. s/p St. Jude ICD implanted 04/2015.    Current Outpatient Medications  Medication Sig Dispense Refill  . amiodarone (PACERONE) 200 MG tablet Take 1 tablet (200 mg total) by mouth daily. 90 tablet 3  . apixaban (ELIQUIS) 5 MG TABS tablet Take 1 tablet (5 mg total) by mouth 2 (two) times daily. 60 tablet 3  . atorvastatin (LIPITOR) 40 MG tablet Take 1 tablet (40 mg total) by mouth daily at 6 PM. 30 tablet 1  . Blood Glucose Monitoring Suppl (TRUE METRIX METER) DEVI 1 each by Does not apply route 2 (two) times daily. 1 Device 0  . folic acid (FOLVITE) 1 MG tablet Take 1 tablet (1 mg total) by mouth daily. 90 tablet 1  . furosemide (LASIX) 40 MG tablet Take 40 mg by mouth daily.    [name] glipiZIDE (GLUCOTROL) 5 MG tablet Take 1 tablet (5 mg total) by mouth daily. 90 tablet 1  . glucose blood (TRUE METRIX BLOOD GLUCOSE TEST) test strip Use as instructed twice daily 100 each 12  . losartan (COZAAR) 25 MG tablet TAKE 1 TABLET BY MOUTH ONCE DAILY 90 tablet 0  . ondansetron (ZOFRAN-ODT) 4 MG disintegrating tablet Take 1 tablet (4 mg total) by mouth every 8 (eight) hours as needed for nausea or vomiting. 20 tablet 0  . TRUEPLUS LANCETS 28G MISC Use as instructed twice daily. 60 each 5   No current facility-administered medications for this  encounter.    Vitals:   07/08/17 1216  BP: (!) 150/84  Pulse: 65  SpO2: 94%  Weight: 173 lb 14.4 oz (78.9 kg)   Wt Readings from Last 3 Encounters:  07/08/17 173 lb 14.4 oz (78.9 kg)  04/07/17 171 lb (77.6 kg)  03/10/17 167 lb 3.2 oz (75.8 kg)   General:  Well appearing. No resp difficulty HEENT: normal Neck: supple. no JVD. Carotids 2+ bilat; no bruits. No lymphadenopathy or thryomegaly appreciated. Cor: PMI nondisplaced. Regular rate & rhythm. No rubs, gallops or murmurs. Lungs: clear  Abdomen: soft, nontender, nondistended. No hepatosplenomegaly. No bruits or masses. Good bowel sounds. Extremities: no cyanosis, clubbing, rash, edema Neuro: alert & orientedx3, cranial nerves grossly intact. moves all 4 extremities w/o difficulty. Affect pleasant    ASSESSMENT & PLAN: 1. 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 EF 45-50% post CABG 5/18 - She is s/p VF arrest and CABG in 4/18. Prolonged post-hospital course with low output HF despite relatively preserved EF. RHC suggestive of primarily RV failure.  - Technetium pyrophosphate scan negative for amyloid. - EF has recovered. She is doing well. NYHA II. Volume status looks good. - ICD interrogated personally. No VT. No recent AF  - Continue losartan 25 mg daily.  - Has not tolerated b-blocker well - Continue lasix 40 mg daily.  - Developed significant nausea and numbness around her lips and mouth after starting digoxin. Now off. Would not re-challenge. - BMET today.   2. PAF - S/P DC-CV 09/20/15. - No recent  Afib by device interrogation.  - Continue Eliquis for anticoagulation. No bleeding  3. CAD - 3V CAD s/p CABG. Maze and LAA clipping 4/18 - No s/s ischemia. No ASA with need for Eliquis. - Continue statin  4. H/O VT with ICD placed 04/2015. - s/p VF arrest 4/18 with ICD shock.  - No subsequent VT. Can top amio   5. DM2  - Follows with PCP.  - Consider addition of Jardiance   Glori Bickers, MD  12:29 PM

## 2017-07-09 ENCOUNTER — Encounter: Payer: Self-pay | Admitting: Cardiology

## 2017-07-09 ENCOUNTER — Other Ambulatory Visit (HOSPITAL_COMMUNITY): Payer: Self-pay | Admitting: *Deleted

## 2017-07-09 MED ORDER — APIXABAN 5 MG PO TABS: 5.0000 mg | ORAL_TABLET | Freq: Two times a day (BID) | ORAL | 3 refills | Status: DC

## 2017-07-09 NOTE — Progress Notes (Signed)
Remote ICD transmission.   

## 2017-07-10 MED FILL — FUROSEMIDE 40 MG TAB: 40 | 30 days supply | Qty: 90 | Fill #1

## 2017-07-15 ENCOUNTER — Telehealth (HOSPITAL_COMMUNITY): Payer: Self-pay | Admitting: *Deleted

## 2017-07-15 DIAGNOSIS — E875 Hyperkalemia: Secondary | ICD-10-CM

## 2017-07-15 NOTE — Telephone Encounter (Signed)
-----   Message from Jolaine Artist, MD sent at 07/08/2017  8:49 PM EST ----- Potassium is up but not taking Kcl or spiro. Please make sure she is not using Mrs. Dash or other potassium supplements. Repeat 1 week. Thanks

## 2017-07-15 NOTE — Telephone Encounter (Signed)
Notes recorded by Scarlette Calico, RN on 07/15/2017 at 12:13 PM EST Pt aware, she denies salt substitiutes and high K foods, she will watch what she eats and recheck labs 12/20, that is the earliest she can get transportation

## 2017-07-17 LAB — CUP PACEART REMOTE DEVICE CHECK
Brady Statistic AP VS Percent: 12 %
Brady Statistic AS VP Percent: 5.4 %
Brady Statistic AS VS Percent: 56 %
Brady Statistic RA Percent Paced: 32 %
Brady Statistic RV Percent Paced: 28 %
Date Time Interrogation Session: 20181205171441
HIGH POWER IMPEDANCE MEASURED VALUE: 53 Ohm
HighPow Impedance: 53 Ohm
Implantable Lead Implant Date: 20160912
Implantable Lead Location: 753859
Lead Channel Impedance Value: 350 Ohm
Lead Channel Pacing Threshold Amplitude: 1 V
Lead Channel Pacing Threshold Amplitude: 1 V
Lead Channel Pacing Threshold Pulse Width: 0.5 ms
Lead Channel Sensing Intrinsic Amplitude: 0.7 mV
Lead Channel Setting Pacing Pulse Width: 0.5 ms
Lead Channel Setting Sensing Sensitivity: 0.5 mV
MDC IDC LEAD IMPLANT DT: 20160912
MDC IDC LEAD LOCATION: 753860
MDC IDC MSMT BATTERY REMAINING LONGEVITY: 64 mo
MDC IDC MSMT BATTERY REMAINING PERCENTAGE: 74 %
MDC IDC MSMT BATTERY VOLTAGE: 2.96 V
MDC IDC MSMT LEADCHNL RA PACING THRESHOLD PULSEWIDTH: 0.5 ms
MDC IDC MSMT LEADCHNL RV IMPEDANCE VALUE: 410 Ohm
MDC IDC MSMT LEADCHNL RV SENSING INTR AMPL: 11.7 mV
MDC IDC PG IMPLANT DT: 20160912
MDC IDC PG SERIAL: 7279969
MDC IDC SET LEADCHNL RA PACING AMPLITUDE: 2.5 V
MDC IDC SET LEADCHNL RV PACING AMPLITUDE: 2.5 V
MDC IDC STAT BRADY AP VP PERCENT: 25 %

## 2017-07-23 ENCOUNTER — Other Ambulatory Visit (HOSPITAL_COMMUNITY): Payer: Self-pay

## 2017-07-30 ENCOUNTER — Other Ambulatory Visit: Payer: Self-pay | Admitting: Internal Medicine

## 2017-08-11 MED FILL — ELIQUIS 5 MG TABLET: 5 | 30 days supply | Qty: 60 | Fill #0

## 2017-09-11 MED FILL — ELIQUIS 5 MG TABLET: 5 | 30 days supply | Qty: 60 | Fill #1

## 2017-10-02 ENCOUNTER — Other Ambulatory Visit (HOSPITAL_COMMUNITY): Payer: Self-pay

## 2017-10-02 MED ORDER — FUROSEMIDE 40 MG PO TABS: 40.0000 mg | ORAL_TABLET | Freq: Every day | ORAL | 3 refills | Status: DC

## 2017-10-07 ENCOUNTER — Telehealth: Payer: Self-pay | Admitting: Cardiology

## 2017-10-07 ENCOUNTER — Encounter: Payer: PPO | Admitting: *Deleted

## 2017-10-07 NOTE — Telephone Encounter (Signed)
LMOVM reminding pt to send remote transmission.   

## 2017-10-08 ENCOUNTER — Encounter: Payer: Self-pay | Admitting: Cardiology

## 2017-10-13 MED FILL — ELIQUIS 5 MG TABLET: 5 | 30 days supply | Qty: 60 | Fill #2

## 2017-11-10 MED FILL — ELIQUIS 5 MG TABLET: 5 | 30 days supply | Qty: 60 | Fill #3

## 2017-12-14 MED FILL — ELIQUIS 5 MG TABLET: 5 | 30 days supply | Qty: 60 | Fill #1

## 2017-12-24 ENCOUNTER — Other Ambulatory Visit (HOSPITAL_COMMUNITY): Payer: Self-pay | Admitting: Student

## 2017-12-24 ENCOUNTER — Other Ambulatory Visit (HOSPITAL_COMMUNITY): Payer: Self-pay | Admitting: Internal Medicine

## 2018-01-08 ENCOUNTER — Encounter: Payer: Self-pay | Admitting: Cardiology

## 2018-01-12 ENCOUNTER — Other Ambulatory Visit (HOSPITAL_COMMUNITY): Payer: Self-pay | Admitting: Student

## 2018-01-12 MED FILL — ELIQUIS 5 MG TABLET: 5 | 30 days supply | Qty: 60 | Fill #2

## 2018-01-26 ENCOUNTER — Other Ambulatory Visit (HOSPITAL_COMMUNITY): Payer: Self-pay | Admitting: Student

## 2018-02-11 MED FILL — ELIQUIS 5 MG TABLET: 5 | 30 days supply | Qty: 60 | Fill #3

## 2018-03-12 ENCOUNTER — Other Ambulatory Visit (HOSPITAL_COMMUNITY): Payer: Self-pay | Admitting: Internal Medicine

## 2018-03-12 MED FILL — ELIQUIS 5 MG TABLET: 5 | 30 days supply | Qty: 60 | Fill #0

## 2018-03-21 ENCOUNTER — Other Ambulatory Visit (HOSPITAL_COMMUNITY): Payer: Self-pay | Admitting: Internal Medicine

## 2018-03-29 DIAGNOSIS — E113511 Type 2 diabetes mellitus with proliferative diabetic retinopathy with macular edema, right eye: Secondary | ICD-10-CM | POA: Diagnosis not present

## 2018-03-29 DIAGNOSIS — E113592 Type 2 diabetes mellitus with proliferative diabetic retinopathy without macular edema, left eye: Secondary | ICD-10-CM | POA: Diagnosis not present

## 2018-03-29 DIAGNOSIS — H4311 Vitreous hemorrhage, right eye: Secondary | ICD-10-CM | POA: Diagnosis not present

## 2018-03-29 DIAGNOSIS — H4312 Vitreous hemorrhage, left eye: Secondary | ICD-10-CM | POA: Diagnosis not present

## 2018-03-29 LAB — HM DIABETES EYE EXAM

## 2018-04-13 MED FILL — ELIQUIS 5 MG TABLET: 5 | 30 days supply | Qty: 60 | Fill #1

## 2018-05-19 MED FILL — ELIQUIS 5 MG TABLET: 5 | 30 days supply | Qty: 60 | Fill #2

## 2018-05-21 ENCOUNTER — Encounter: Payer: Self-pay | Admitting: Cardiology

## 2018-06-15 MED FILL — ELIQUIS 5 MG TABLET: 5 | 30 days supply | Qty: 60 | Fill #3

## 2018-06-20 IMAGING — CR DG CHEST 2V
2 series · 2 of 2 positions shown · non-contrast
Comparison: Chest radiograph dated 05/19/2016

CLINICAL DATA: 62-year-old female with shortness of breath. History
of CHF an AP.

EXAM:
CHEST  2 VIEW

[chest lat]
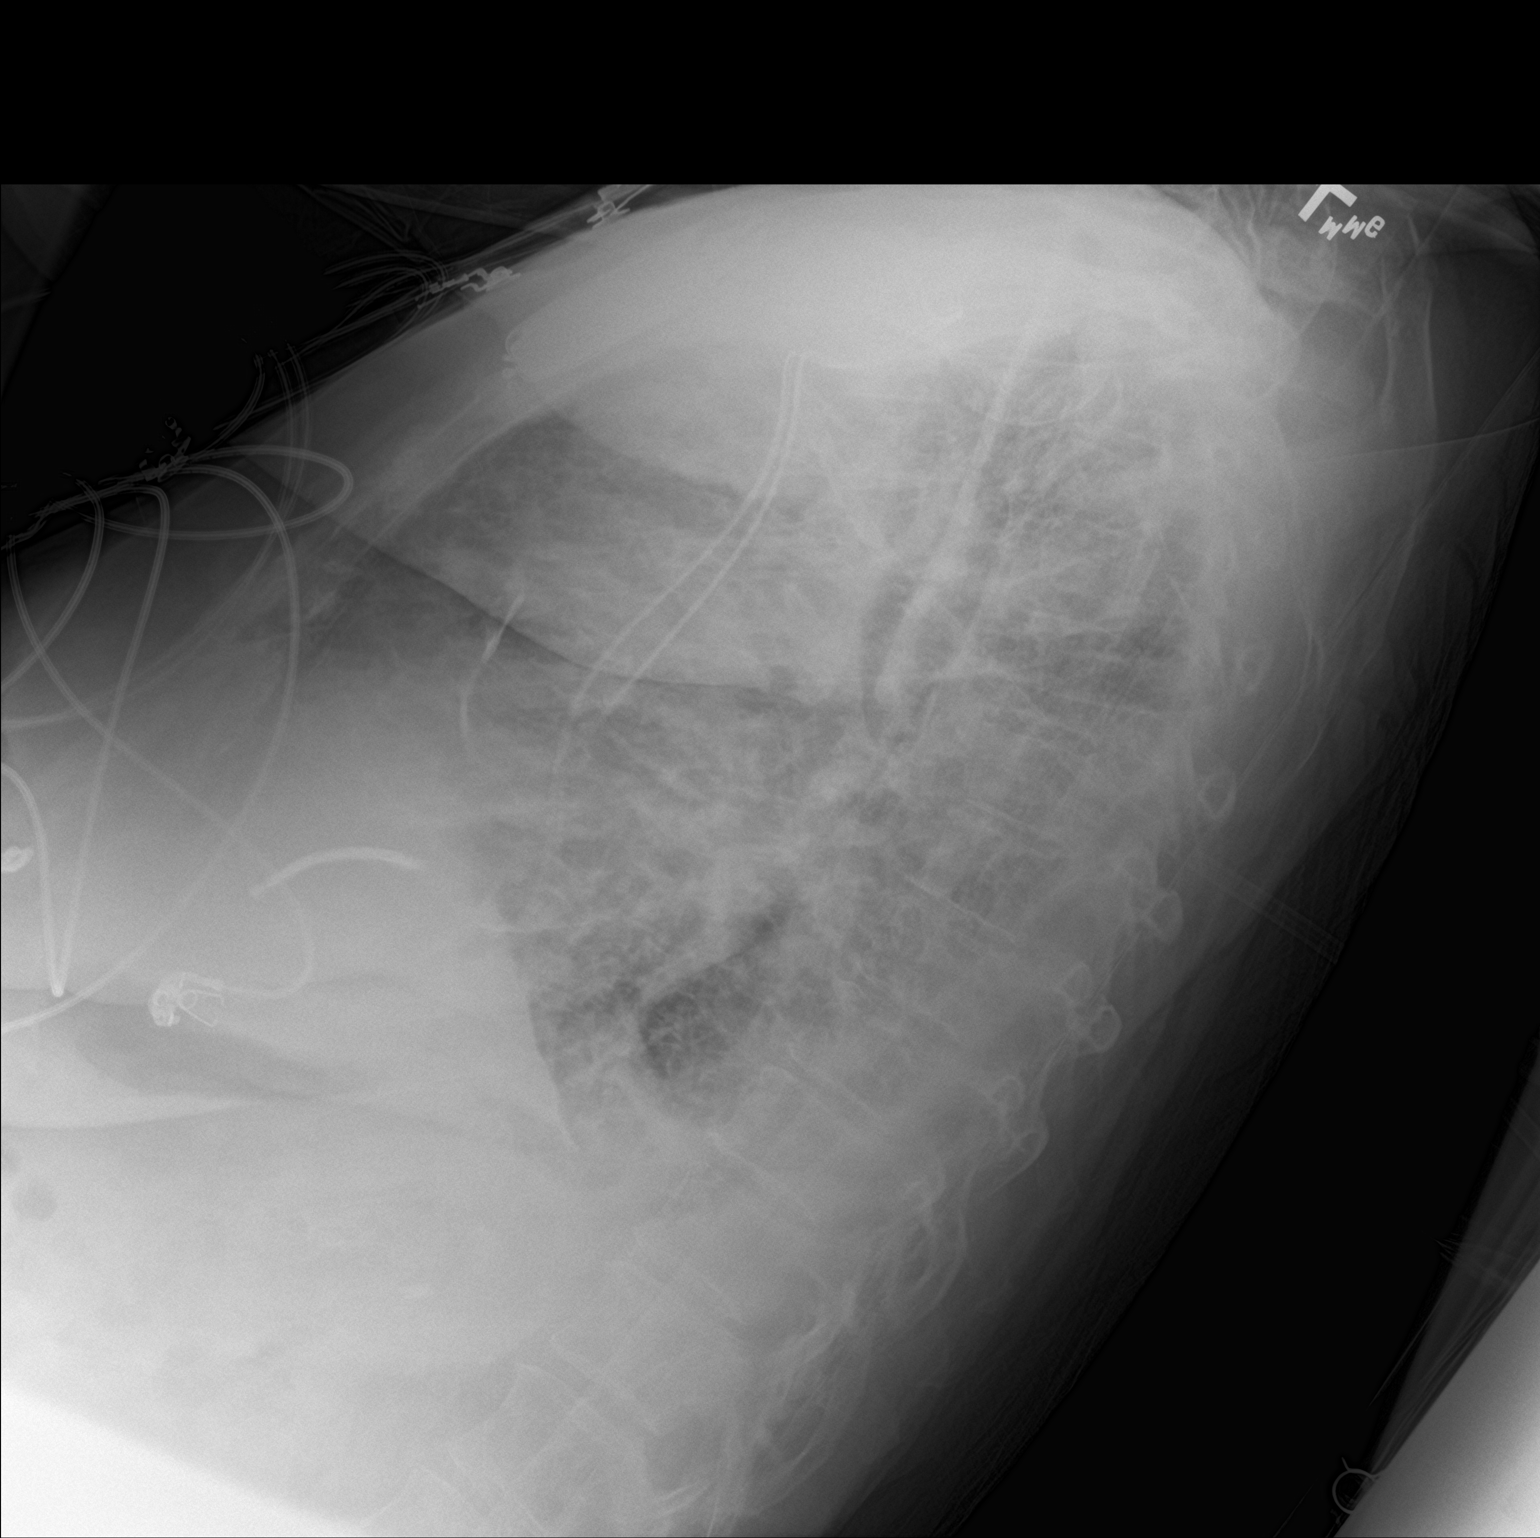

[chest ap]
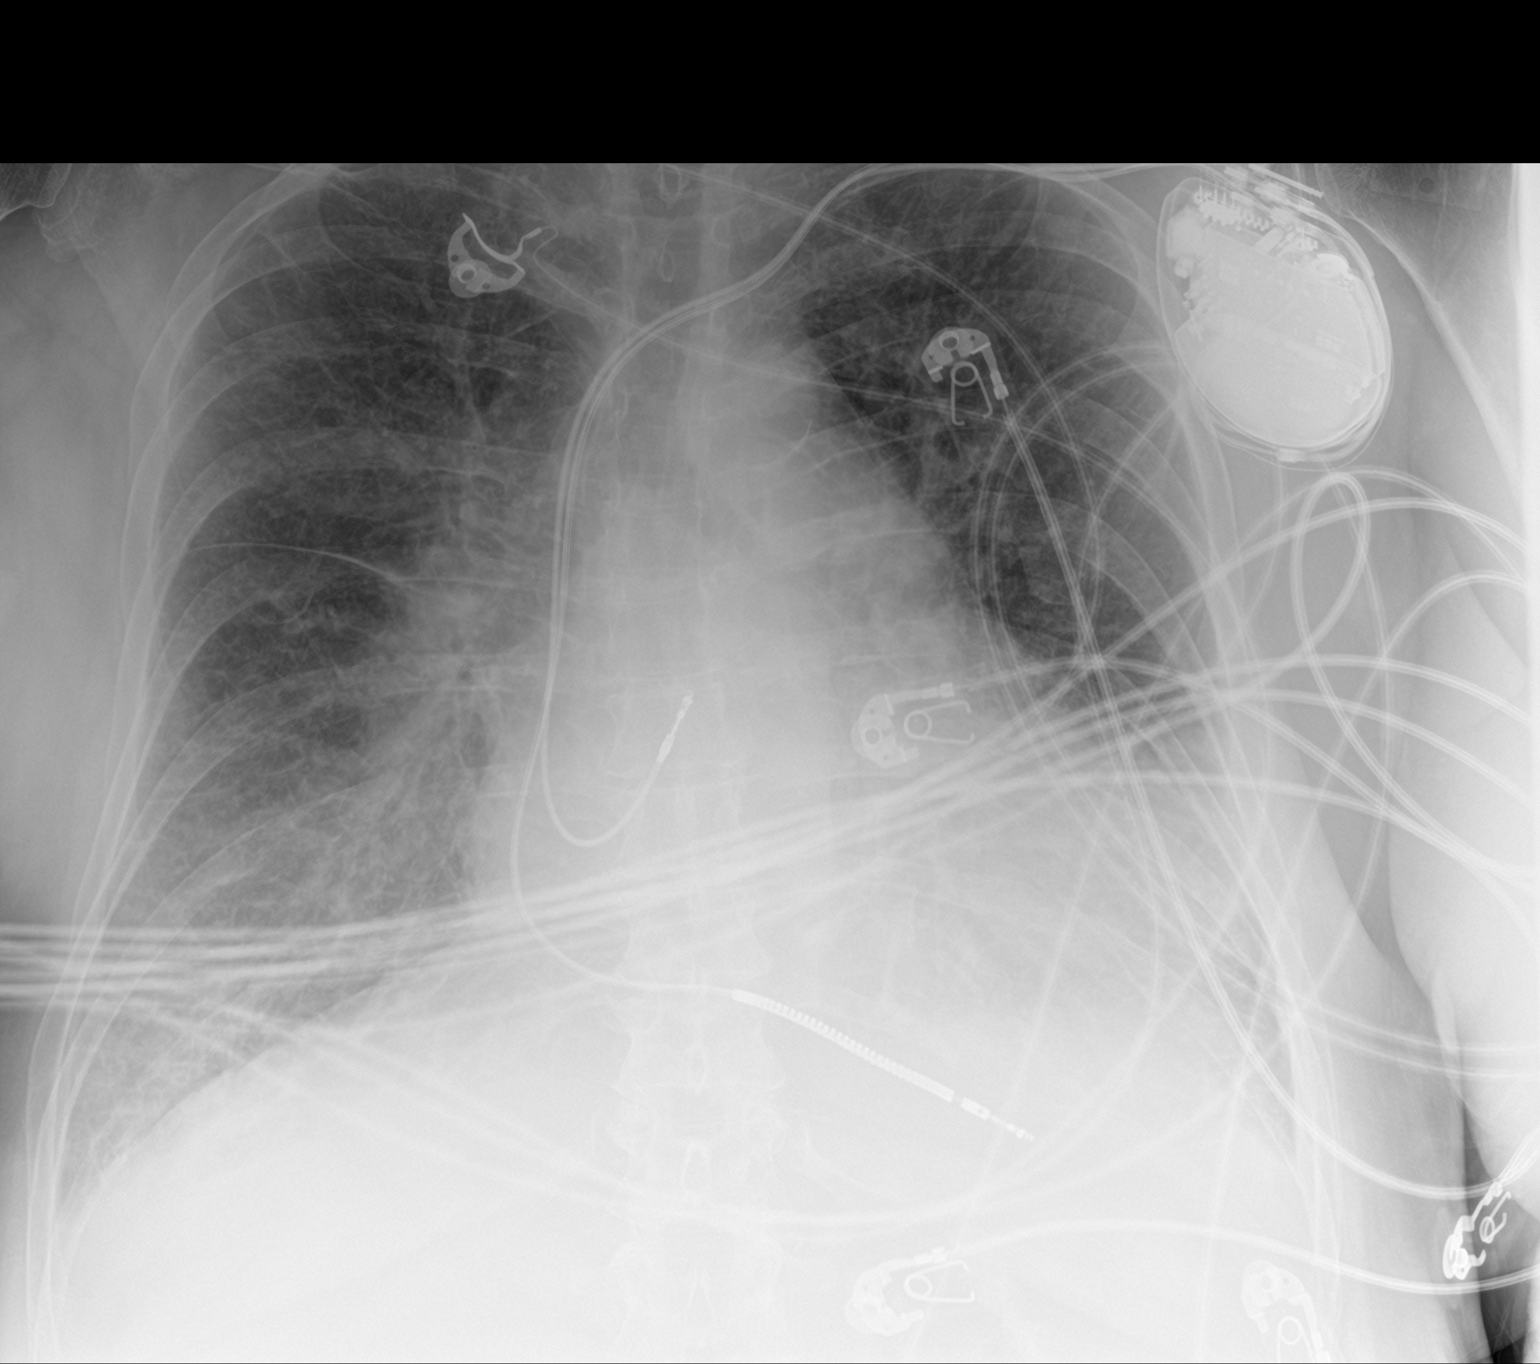

[2 of 2 positions shown; findings below may reference images not displayed]

FINDINGS: There is stable cardiomegaly. Streaky perihilar and central
densities as well as diffuse interstitial prominence consistent with
vascular congestion and edema. Pneumonia is not excluded. Small left
pleural effusion with subsegmental atelectatic changes of the left
lung base. Superimposed pneumonia is not excluded. Clinical
correlation is recommended. Left pectoral dual lead AICD device. No
acute osseous pathology.
IMPRESSION: Cardiomegaly with findings of CHF and small left pleural effusion.
Left lung base subsegmental atelectasis versus infiltrate.

## 2018-06-22 IMAGING — DX DG ORTHOPANTOGRAM /PANORAMIC
1 series · 1 of 1 positions shown · non-contrast
Comparison: CT, 11/27/2016

CLINICAL DATA: Dental carries; CHF; pt denies any dental pain a

EXAM:
ORTHOPANTOGRAM/PANORAMIC

[view not recorded]
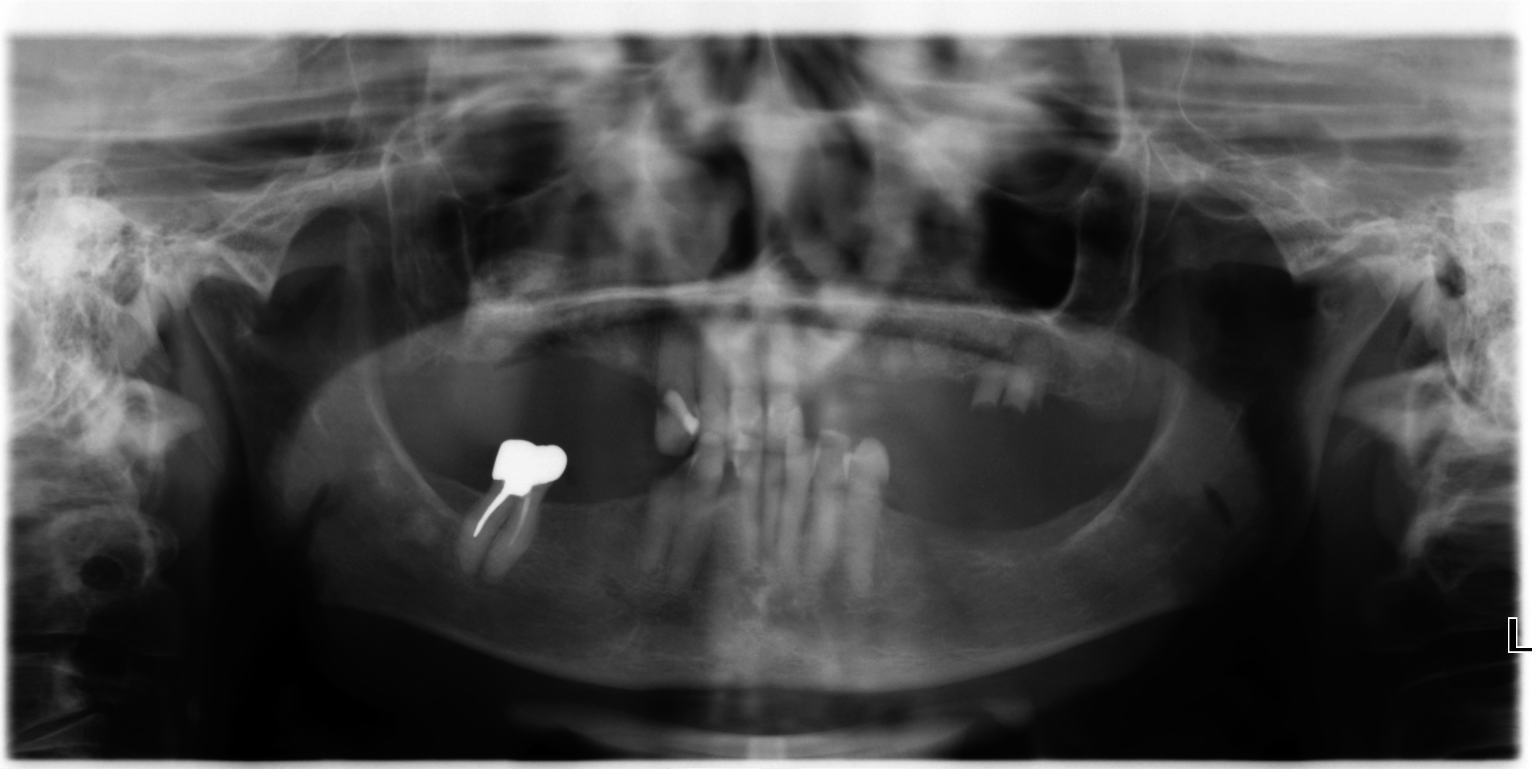

[1 of 1 positions shown; findings below may reference images not displayed]

FINDINGS: No fracture.  No bone lesion.

There multiple missing teeth. There are significant curies resorbing
the crown of the single remaining left maxillary molar, #15. There
is resorption of the crown also consistent with Shaukat Sanders of the left
mandibular lateral incisor, #23, and the right mandibular Cuspid,
#27.

No significant periapical lucency.
IMPRESSION: 1. No fracture or bone lesion.
2. Multiple missing teeth. Dental caries involving 3 of the
remaining teeth as detailed above.

## 2018-07-14 ENCOUNTER — Other Ambulatory Visit (HOSPITAL_COMMUNITY): Payer: Self-pay | Admitting: Internal Medicine

## 2018-07-14 MED FILL — ELIQUIS 5 MG TABLET: 5 | 30 days supply | Qty: 60 | Fill #0

## 2018-07-29 ENCOUNTER — Other Ambulatory Visit (HOSPITAL_COMMUNITY): Payer: Self-pay

## 2018-08-18 ENCOUNTER — Other Ambulatory Visit (HOSPITAL_COMMUNITY): Payer: Self-pay | Admitting: *Deleted

## 2018-08-19 ENCOUNTER — Other Ambulatory Visit (HOSPITAL_COMMUNITY): Payer: Self-pay

## 2018-08-19 MED ORDER — APIXABAN 5 MG PO TABS: 5.0000 mg | ORAL_TABLET | Freq: Two times a day (BID) | ORAL | 2 refills | Status: DC

## 2018-09-21 ENCOUNTER — Other Ambulatory Visit (HOSPITAL_COMMUNITY): Payer: Self-pay

## 2018-09-21 MED ORDER — FUROSEMIDE 40 MG PO TABS: 40.0000 mg | ORAL_TABLET | Freq: Every day | ORAL | 0 refills | Status: DC

## 2018-09-21 MED ORDER — LOSARTAN POTASSIUM 25 MG PO TABS: 25.0000 mg | ORAL_TABLET | Freq: Every day | ORAL | 0 refills | Status: DC

## 2018-10-23 ENCOUNTER — Other Ambulatory Visit (HOSPITAL_COMMUNITY): Payer: Self-pay | Admitting: Internal Medicine

## 2018-10-28 ENCOUNTER — Encounter (HOSPITAL_COMMUNITY): Payer: Self-pay | Admitting: Internal Medicine

## 2018-12-09 ENCOUNTER — Telehealth: Payer: Self-pay | Admitting: Cardiology

## 2018-12-09 NOTE — Telephone Encounter (Signed)
Called pt number and son number. LMOVM requesting that pt send manual transmission w/ home monitor.

## 2018-12-15 ENCOUNTER — Other Ambulatory Visit (HOSPITAL_COMMUNITY): Payer: Self-pay | Admitting: Internal Medicine

## 2018-12-16 NOTE — Telephone Encounter (Signed)
Patient has not been seen since 07/2017. Ok to refill medication? Please advise

## 2018-12-17 ENCOUNTER — Other Ambulatory Visit (HOSPITAL_COMMUNITY): Payer: Self-pay | Admitting: *Deleted

## 2018-12-17 MED ORDER — FUROSEMIDE 40 MG PO TABS: 40.0000 mg | ORAL_TABLET | Freq: Every day | ORAL | 0 refills | Status: DC

## 2018-12-17 MED ORDER — LOSARTAN POTASSIUM 25 MG PO TABS: 25.0000 mg | ORAL_TABLET | Freq: Every day | ORAL | 0 refills | Status: DC

## 2018-12-24 ENCOUNTER — Other Ambulatory Visit (HOSPITAL_COMMUNITY): Payer: Self-pay | Admitting: Internal Medicine

## 2018-12-24 NOTE — Telephone Encounter (Signed)
Patient has not been seen since 2018. Please advise on refill

## 2019-01-05 ENCOUNTER — Ambulatory Visit (HOSPITAL_COMMUNITY)
Admission: RE | Admit: 2019-01-05 | Discharge: 2019-01-05 | Disposition: A | Discharge status: Home / Self Care | Payer: PPO | Source: Ambulatory Visit | Attending: Internal Medicine | Admitting: Internal Medicine

## 2019-01-05 ENCOUNTER — Other Ambulatory Visit: Payer: Self-pay

## 2019-01-05 DIAGNOSIS — I480 Paroxysmal atrial fibrillation: Secondary | ICD-10-CM

## 2019-01-05 DIAGNOSIS — Z951 Presence of aortocoronary bypass graft: Secondary | ICD-10-CM

## 2019-01-05 DIAGNOSIS — I2510 Atherosclerotic heart disease of native coronary artery without angina pectoris: Secondary | ICD-10-CM

## 2019-01-05 DIAGNOSIS — I5022 Chronic systolic (congestive) heart failure: Secondary | ICD-10-CM

## 2019-01-05 NOTE — Patient Instructions (Addendum)
Congratulations for graduating from Heart failure clinic!!  You have been referred to general Cardiology.  Dr Haroldine Laws recommends that you follow up in 6 months with Dr Dani Gobble Croitoru.  They will call you to schedule an appointment.

## 2019-01-05 NOTE — Progress Notes (Signed)
Called patient, LM for patient to call office to review AVS. AVS mailed   AVS summary: Congratulations for graduating from Heart failure clinic!!!    You have been referred to general Cardiology.  Dr Haroldine Laws recommends that you follow up in 6 months with Dr Dani Gobble Croitoru.  They will call you to schedule an appointment.

## 2019-01-05 NOTE — Addendum Note (Signed)
Encounter addended by: Valeda Malm, RN on: 01/05/2019 3:37 PM  Actions taken: Order list changed, Diagnosis association updated, Clinical Note Signed

## 2019-01-05 NOTE — Progress Notes (Signed)
Heart Failure TeleHealth Note  Due to national recommendations of social distancing due to [place] 19, Audio/video telehealth visit is felt to be most appropriate for this patient at this time.  See MyChart message from today for patient consent regarding telehealth for [facility]  Date:  01/05/2019   ID:  ARMEDA PLUMB, DOB 07/20/1946, MRN [id number]  Location: Home  Provider location: [facility] Type of Visit: Established patient  PCP:  Charlott Rakes, MD  Cardiologist:  No primary care provider on file. Primary HF:   Chief Complaint: Heart Failure follow-up   History of Present Illness:  [name] is a 73 y.o. 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 Jude ICD 04/2015, & chronic diastolic CHF (EF 61-95% by echo, 45-50% by cath).   Admitted 09/17/15 with new rapid aflutter (previously PAF/VT) with plans for cardioversion. She was also noted to be volume overload in setting of non-compliance (thought to be dietary). Had TEE/DC-CV with return to NSR. She developed laryngospasm and required brief intubation but recovered in PACU post procedure. Enrolled in the ReDS vest trial,  Reading 53%. She continued to diurese with IV lasix and transitioned to lasix 40 mg twice a day. On the day of discharge Reds Vest reading was 39. Discharge weight was 160 pounds.   Admitted 11/27/16-01/08/17 with VF shocked appropriately by ICD. Underwent cath that showed severe 3 vessel disease followed  CABG x 4, MAZE, and LAA clip. Post operatively developed AF with RVR and low output heart failure, initial co ox was 36%.Converted to NSR on amio. She was started on milrinone but unable to wean off successfully due to recurrent low output HF. Echo showed EF 45-50% with mild RV hypokinesis.  RHC  performed on milrinone which showed primarily RV failure. Suspicion for amyloid raised. SPEP negative. Technetium pyrophosphate scan negative for TTR amyloid. Sent home on milrinone 0.375 mcg. Also had e. Coli UTI and treated with abx.  Home milrinone weaned off and PICC pulled in 03/2017.   She who presents via Engineer, civil (consulting) for a telehealth visit today due to COVID restrictions. We have not seen her since 12/18. Says she is doing very well. Remains active. No CP, SOB, edema, orthopnea or PND. Taking lasix 40 daily. On Eliquis. No bleeding.  Beverly Gust Varon denies symptoms worrisome for COVID 19.   Past Medical History:  Diagnosis Date  . Abnormal PFT   . CAD (coronary artery disease), native coronary artery - 3 vessel 04/21/2015   a. NSTEMI 8-04/2015 felt 2/2 demand ischemia. b. 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  . Chronic diastolic CHF (congestive heart failure) ([place])   . Diabetes mellitus ([place])   . Diabetes mellitus without complication ([place])    dx on wed  . Hypertrophic cardiomyopathy ([place])   . Left atrial enlargement   . Mitral regurgitation    a. Echo 04/2015: moderate mitral regurgitation. b. F/u echo 05/2015: mild MR.  . NSTEMI (non-ST elevated myocardial infarction) ([place]) 11/27/2016  . Paroxysmal atrial fibrillation ([name])   . Shortness of breath dyspnea   . Tricuspid regurgitation    a. Echo 04/2015: Mod-severe TR. b. Not mentioned on echo 05/2015  . Ventricular tachycardia ([place])    a. s/p St. Jude ICD  implanted 04/2015.   Past Surgical History:  Procedure Laterality Date  . APPLICATION OF WOUND VAC Left 01/06/2013   Procedure: APPLICATION OF WOUND VAC X 2;  Surgeon: [name], MD;  Location: [facility];  Service: Orthopedics;  Laterality: Left;  left forearm  . APPLICATION OF WOUND VAC Left 01/09/2013   Procedure: APPLICATION OF WOUND VAC;  Surgeon: [name], MD;  Location: [facility];  Service:  Orthopedics;  Laterality: Left;  . CARDIAC CATHETERIZATION N/A 04/19/2015   Procedure: Left Heart Cath and Coronary Angiography;  Surgeon: [name], MD;  Location: [place] [facility];  Service: Cardiovascular;  Laterality: N/A;  . CARDIOVERSION N/A 09/20/2015   Procedure: CARDIOVERSION;  Surgeon: Pixie Casino, MD;  Location: [facility];  Service: Cardiovascular;  Laterality: N/A;  . CLIPPING OF ATRIAL APPENDAGE Left 12/04/2016   Procedure: CLIPPING OF ATRIAL APPENDAGE USING ATRICURE 17 ATRICLIP;  Surgeon: Melrose Nakayama, MD;  Location: [place];  Service: Open Heart Surgery;  Laterality: Left;  . CORONARY ARTERY BYPASS GRAFT N/A 12/04/2016   Procedure: CORONARY ARTERY BYPASS GRAFTING (CABG)x 4 WITH ENDOSCOPIC HARVESTING OF RIGHT SAPHENOUS VEIN;  Surgeon: Melrose Nakayama, MD;  Location: [place];  Service: Open Heart Surgery;  Laterality: N/A;  . DENTAL SURGERY  Oct. 2011   several extractions, bone graft  . EP IMPLANTABLE DEVICE N/A 04/16/2015   Procedure: ICD Implant;  Surgeon: [name], MD;  Location: [place] [facility];  Service: Cardiovascular;  Laterality: N/A;  . I&D EXTREMITY Left 01/06/2013   Procedure: IRRIGATION AND DEBRIDEMENT LEFT ELBOW AND LEFT FOREARM ;  Surgeon: [name], MD;  Location: [facility];  Service: Orthopedics;  Laterality: Left;  . INCISION AND DRAINAGE Left 01/09/2013   Procedure: REDO INCISION AND DRAINAGE LEFT ELBOW;  Surgeon: [name], MD;  Location: [facility];  Service: Orthopedics;  Laterality: Left;  SUPINE, UPPER EXTERMITY DRAPE  . INTRAOPERATIVE TRANSESOPHAGEAL ECHOCARDIOGRAM N/A 12/04/2016   Procedure: INTRAOPERATIVE TRANSESOPHAGEAL ECHOCARDIOGRAM;  Surgeon: Melrose Nakayama, MD;  Location: [place];  Service: Open Heart Surgery;  Laterality: N/A;  . IR THORACENTESIS ASP PLEURAL SPACE W/IMG GUIDE  12/22/2016  . LEFT HEART CATH AND CORONARY ANGIOGRAPHY N/A 11/28/2016   Procedure: Left Heart Cath and Coronary Angiography;  Surgeon: Belva Crome, MD;  Location: [place] [facility];  Service: Cardiovascular;  Laterality: N/A;  . MAZE N/A 12/04/2016   Procedure: MAZE PROCEDURE;  Surgeon: Melrose Nakayama, MD;  Location: [place];  Service: Open Heart Surgery;  Laterality: N/A;  . RIGHT HEART CATH N/A 01/02/2017   Procedure: Right Heart Cath;  Surgeon: Jolaine Artist, MD;  Location: [place] [facility];  Service: Cardiovascular;  Laterality: N/A;  . SECONDARY CLOSURE OF WOUND Left 01/09/2013   Procedure: SECONDARY CLOSURE OF WOUND  LEFT ELBOW;  Surgeon: [name], MD;  Location: [facility];  Service: Orthopedics;  Laterality: Left;  . TEE WITHOUT CARDIOVERSION N/A 09/20/2015   Procedure: TRANSESOPHAGEAL ECHOCARDIOGRAM (TEE);  Surgeon: Pixie Casino, MD;  Location: [facility];  Service: Cardiovascular;  Laterality: N/A;     Current Outpatient Medications  Medication Sig Dispense Refill  . atorvastatin (LIPITOR) 40 MG tablet Take 1 tablet (40 mg total) by mouth daily at 6 PM. 30 tablet 1  . Blood Glucose Monitoring Suppl (TRUE METRIX METER) DEVI 1 each by Does not apply route 2 (two) times daily. 1 Device 0  . ELIQUIS 5 MG TABS tablet TAKE ONE TABLET  BY MOUTH TWICE A DAY 60 tablet 3  . folic acid (FOLVITE) 1 MG tablet Take 1 tablet (1 mg total) by mouth daily. 90 tablet 1  . furosemide (LASIX) 40 MG tablet Take 1 tablet (40 mg total) by mouth daily. 90 tablet 0  . glipiZIDE (GLUCOTROL) 5 MG tablet Take 1 tablet (5 mg total) by mouth daily. 90 tablet 1  . glucose blood (TRUE METRIX BLOOD GLUCOSE TEST) test strip Use as instructed twice daily 100 each 12  . losartan (COZAAR) 25 MG tablet Take 1 tablet (25 mg total) by mouth daily. 90 tablet 0  . ondansetron (ZOFRAN-ODT) 4 MG disintegrating tablet Take 1 tablet (4 mg total) by mouth every 8 (eight) hours as needed for nausea or vomiting. 20 tablet 0  . TRUEPLUS LANCETS 28G MISC Use as instructed twice daily. 60 each 5   No current facility-administered medications for this encounter.      Allergies:   Phenergan [promethazine hcl]   Social History:  The patient  reports that she quit smoking about 38 years ago. She quit after 4.00 years of use. She has never used smokeless tobacco. She reports that she does not drink alcohol or use drugs.   Family History:  The patient's family history includes Breast cancer in her mother; Diabetes in her father; Healthy in her sister; Heart disease in her sister; Stroke in her father.   ROS:  Please see the history of present illness.   All other systems are personally reviewed and negative.   Exam:  (Video/Tele Health Call; Exam is subjective and or/visual.) General:  Speaks in full sentences. No resp difficulty. Lungs: Normal respiratory effort with conversation.  Abdomen: Non-distended per patient report Extremities: Pt denies edema. Neuro: Alert & oriented x 3.   Recent Labs: No results found for requested labs within last 8760 hours.  Personally reviewed   Wt Readings from Last 3 Encounters:  07/08/17 78.9 kg (173 lb 14.4 oz)  04/07/17 77.6 kg (171 lb)  03/10/17 75.8 kg (167 lb 3.2 oz)      ASSESSMENT AND PLAN:  1. 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  - She is s/p VF arrest and CABG in 4/18. Prolonged post-hospital course with low output HF despite relatively preserved EF. RHC suggestive of primarily RV failure.  - EF 45-50% post CABG 5/18 - Technetium pyrophosphate scan negative for amyloid. - She is doing well. NYHA I Volume status sounds good. - Continue losartan 25 mg daily.  - Has not tolerated b-blocker well - Continue lasix 40 mg daily.  - Developed significant nausea and numbness around her lips and mouth after starting digoxin. Now off. Would not re-challenge. - Will need repeat echo in next few months.   2. PAF - S/P DC-CV 09/20/15. - No recent  Afib - Continue Eliquis for anticoagulation. No bleeding   3. CAD - 3V CAD s/p CABG. Maze and LAA clipping 4/18 - No s/s ischemia. No ASA with need  for Eliquis. - Continue statin  4. H/O VT with ICD placed 04/2015. - s/p VF arrest 4/18 with ICD shock.  - No subsequent VT.   5. DM2  - Follows with PCP.  - Consider addition of Jardiance. She will d/w with her PCP.    COVID screen The patient does not have any symptoms that suggest any further testing/ screening at this time.  Social distancing reinforced today.  Recommended follow-up: Schedule echocardiogram. Will have her f/u with Dr. Drue Novel in 6 months to  establish care.  Relevant cardiac medications were reviewed at length with the patient today.   The patient does not have concerns regarding their medications at this time.   The following changes were made today:  As above  Today, I have spent 14 minutes with the patient with telehealth technology discussing the above issues .    Signed, Glori Bickers, MD  01/05/2019 12:52 PM  [facility] [address] [facility] [place] [address] [phone] (office) [phone] (fax)

## 2019-03-17 ENCOUNTER — Other Ambulatory Visit (HOSPITAL_COMMUNITY): Payer: Self-pay | Admitting: *Deleted

## 2019-03-17 MED ORDER — FUROSEMIDE 40 MG PO TABS: 40.0000 mg | ORAL_TABLET | Freq: Every day | ORAL | 0 refills | Status: DC

## 2019-03-17 MED ORDER — LOSARTAN POTASSIUM 25 MG PO TABS: 25.0000 mg | ORAL_TABLET | Freq: Every day | ORAL | 0 refills | Status: DC

## 2019-05-02 ENCOUNTER — Other Ambulatory Visit (HOSPITAL_COMMUNITY): Payer: Self-pay | Admitting: Internal Medicine

## 2019-05-12 ENCOUNTER — Ambulatory Visit: Payer: PPO | Admitting: Cardiovascular Disease

## 2019-05-12 ENCOUNTER — Other Ambulatory Visit (HOSPITAL_COMMUNITY): Payer: PPO

## 2019-05-26 ENCOUNTER — Other Ambulatory Visit (HOSPITAL_COMMUNITY): Payer: PPO

## 2019-06-13 DIAGNOSIS — E1142 Type 2 diabetes mellitus with diabetic polyneuropathy: Secondary | ICD-10-CM | POA: Diagnosis not present

## 2019-06-13 DIAGNOSIS — I2510 Atherosclerotic heart disease of native coronary artery without angina pectoris: Secondary | ICD-10-CM | POA: Diagnosis not present

## 2019-06-13 DIAGNOSIS — E785 Hyperlipidemia, unspecified: Secondary | ICD-10-CM | POA: Diagnosis not present

## 2019-06-13 DIAGNOSIS — I10 Essential (primary) hypertension: Secondary | ICD-10-CM | POA: Diagnosis not present

## 2019-06-14 ENCOUNTER — Other Ambulatory Visit (HOSPITAL_COMMUNITY): Payer: Self-pay | Admitting: Internal Medicine

## 2019-06-22 ENCOUNTER — Ambulatory Visit (HOSPITAL_COMMUNITY): Payer: PPO | Attending: Cardiovascular Disease

## 2019-06-22 ENCOUNTER — Other Ambulatory Visit: Payer: Self-pay

## 2019-06-22 DIAGNOSIS — I2510 Atherosclerotic heart disease of native coronary artery without angina pectoris: Secondary | ICD-10-CM | POA: Diagnosis not present

## 2019-07-05 ENCOUNTER — Other Ambulatory Visit: Payer: Self-pay

## 2019-07-05 ENCOUNTER — Ambulatory Visit (INDEPENDENT_AMBULATORY_CARE_PROVIDER_SITE_OTHER): Payer: PPO | Admitting: Cardiovascular Disease

## 2019-07-05 ENCOUNTER — Encounter: Payer: Self-pay | Admitting: Cardiovascular Disease

## 2019-07-05 ENCOUNTER — Telehealth: Payer: Self-pay | Admitting: *Deleted

## 2019-07-05 VITALS — BP 110/64 | HR 71 | Ht 64.0 in | Wt 188.0 lb

## 2019-07-05 DIAGNOSIS — I472 Ventricular tachycardia, unspecified: Secondary | ICD-10-CM

## 2019-07-05 DIAGNOSIS — E7800 Pure hypercholesterolemia, unspecified: Secondary | ICD-10-CM

## 2019-07-05 DIAGNOSIS — Z95810 Presence of automatic (implantable) cardiac defibrillator: Secondary | ICD-10-CM

## 2019-07-05 DIAGNOSIS — E669 Obesity, unspecified: Secondary | ICD-10-CM

## 2019-07-05 DIAGNOSIS — I2510 Atherosclerotic heart disease of native coronary artery without angina pectoris: Secondary | ICD-10-CM | POA: Diagnosis not present

## 2019-07-05 DIAGNOSIS — E875 Hyperkalemia: Secondary | ICD-10-CM

## 2019-07-05 DIAGNOSIS — I480 Paroxysmal atrial fibrillation: Secondary | ICD-10-CM

## 2019-07-05 DIAGNOSIS — I5022 Chronic systolic (congestive) heart failure: Secondary | ICD-10-CM | POA: Diagnosis not present

## 2019-07-05 DIAGNOSIS — I422 Other hypertrophic cardiomyopathy: Secondary | ICD-10-CM | POA: Diagnosis not present

## 2019-07-05 DIAGNOSIS — E1169 Type 2 diabetes mellitus with other specified complication: Secondary | ICD-10-CM

## 2019-07-05 NOTE — Progress Notes (Signed)
Cardiology Office Note:    Date:  07/08/2019   ID:  [name], DOB [birth year], MRN [id number]  PCP:  Maurice Small, MD  Cardiologist:  Shaunna Rosetti Electrophysiologist:  None   Referring MD: Jolaine Artist, MD   Chief Complaint  Patient presents with  . Coronary Artery Disease  . Congestive Heart Failure  . Pacemaker Check    ICD    History of Present Illness:    [name] is a 73 y.o. 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  She has a long complicated cardiovascular history.  In 2016, she was hospitalized with urosepsis and atrial flutter with rapid ventricular rate and underwent TEE guided cardioversion.  LVEF was 45%.  She was in heart failure and had sustained ventricular tachycardia requiring antiarrhythmics, but improved with diuretic therapy (discharge weight 160 pounds).  She was evaluated for bypass surgery at that time, but because of her poor functional status surgery was deferred.  A defibrillator was implanted during that hospitalization.  Amiodarone was discontinued later that year and her ejection fraction had improved to 60-65%.  She stopped all of her cardiac medications and missed several follow-up appointments.  She presented again in 2017 with atrial flutter and rapid ventricular rates and amiodarone and furosemide therapy was restarted.  She required hospitalization and DC cardioversion again at that time.  She was hospitalized again in 2018 when she presented with ventricular fibrillation appropriately treated by her defibrillator.  Cardiac catheterization showed multivessel CAD and she underwent bypass surgery as  well as Maze procedure and clipping of left atrial appendage.  Postoperatively her recovery was slow due to right heart failure and low cardiac output.  She had a technetium pyrophosphate scan that was negative for amyloidosis.  She actually required several weeks of intravenous milrinone as an outpatient before for recovery.  She was once again lost to follow-up for over a year.  Her defibrillator has not been interrogated since 2018.  She had a telehealth visit with Dr. Haroldine Laws a few months ago.  This is her first in person evaluation since 2018 and her first device check since that time.  Her last lipid profile and most recent routine labs were checked in November 2018  She reports doing well and has no cardiovascular complaints.  She states that she is compliant with her anticoagulant (Eliquis) and has not had any bleeding problems.  She is not taking amiodarone or beta-blockers or statin but is receiving a low-dose of losartan and furosemide.  She is on a sulfonylurea for diabetes mellitus.  The patient specifically denies any chest pain at rest exertion, dyspnea at rest or with exertion, orthopnea, paroxysmal nocturnal dyspnea, syncope, palpitations, focal neurological deficits, intermittent claudication, lower extremity edema, unexplained weight gain, cough, hemoptysis or wheezing.  Comprehensive interrogation of her pacemaker today generally shows normal device function.  Her dual-chamber Saint Jude ellipse ICD was implanted in 2016.  There have been no episodes of atrial fibrillation recorded since May 2018 (shortly after her bypass surgery and maze procedure).  The device has reported occasional episodes of high ventricular rates up to a maximum of 10 seconds in duration.  Most of these probably represent paroxysmal atrial tachycardia, but some of the short events are probably true  nonsustained VT.  The device has not delivered therapy.  There is also evidence of pacemaker mediated tachycardia  and the PVARP was extended today.  Note is made of reduced sensing on the atrial channel with P waves that are now down to 0.6-1.0 mV, automatic sensitivity adjustment is turned on.  Corvue is at baseline. The cybersecurity upgrade was also performed today.  Past Medical History:  Diagnosis Date  . Abnormal PFT   . CAD (coronary artery disease), native coronary artery - 3 vessel 04/21/2015   a. NSTEMI 8-04/2015 felt 2/2 demand ischemia. b. 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  . Chronic diastolic CHF (congestive heart failure) ([place])   . Diabetes mellitus ([place])   . Diabetes mellitus without complication ([place])    dx on wed  . Hypertrophic cardiomyopathy ([place])   . Left atrial enlargement   . Mitral regurgitation    a. Echo 04/2015: moderate mitral regurgitation. b. F/u echo 05/2015: mild MR.  . NSTEMI (non-ST elevated myocardial infarction) ([place]) 11/27/2016  . Paroxysmal atrial fibrillation ([name])   . Shortness of breath dyspnea   . Tricuspid regurgitation    a. Echo 04/2015: Mod-severe TR. b. Not mentioned on echo 05/2015  . Ventricular tachycardia ([place])    a. s/p St. Jude ICD implanted 04/2015.    Past Surgical History:  Procedure Laterality Date  . APPLICATION OF WOUND VAC Left 01/06/2013   Procedure: APPLICATION OF WOUND VAC X 2;  Surgeon: [name], MD;  Location: [facility];  Service: Orthopedics;  Laterality: Left;  left forearm  . APPLICATION OF WOUND VAC Left 01/09/2013   Procedure: APPLICATION OF WOUND VAC;  Surgeon: [name], MD;  [facility]: [facility];  Service: Orthopedics;  Laterality: Left;  . CARDIAC CATHETERIZATION N/A 04/19/2015   Procedure: Left Heart Cath and Coronary Angiography;  Surgeon: [name], MD;  Location: [place] [facility];  Service: Cardiovascular;  Laterality: N/A;  . CARDIOVERSION N/A 09/20/2015   Procedure: CARDIOVERSION;  Surgeon: Pixie Casino, MD;  Location: [facility] END[facility]COPY;  Service:  Cardiovascular;  Laterality: N/A;  . CLIPPING OF ATRIAL APPENDAGE Left 12/04/2016   Procedure: CLIPPING OF ATRIAL APPENDAGE USING ATRICURE 56 ATRICLIP;  Surgeon: Melrose Nakayama, MD;  Location: [place];  Service: Open Heart Surgery;  Laterality: Left;  . CORONARY ARTERY BYPASS GRAFT N/A 12/04/2016   Procedure: CORONARY ARTERY BYPASS GRAFTING (CABG)x 4 WITH ENDOSCOPIC HARVESTING OF RIGHT SAPHENOUS VEIN;  Surgeon: Melrose Nakayama, MD;  Location: [place];  Service: Open Heart Surgery;  Laterality: N/A;  . DENTAL SURGERY  Oct. 2011   several extractions, bone graft  . EP IMPLANTABLE DEVICE N/A 04/16/2015   Procedure: ICD Implant;  Surgeon: [name], MD;  Location: [place] [facility];  Service: Cardiovascular;  Laterality: N/A;  . I&D EXTREMITY Left 01/06/2013   Procedure: IRRIGATION AND DEBRIDEMENT LEFT ELBOW AND LEFT FOREARM ;  Surgeon: [name], MD;  [facility];  Service: Orthopedics;  Laterality: Left;  . INCISION AND DRAINAGE Left 01/09/2013   Procedure: REDO INCISION AND DRAINAGE LEFT ELBOW;  Surgeon: [name], MD;  [facility];  Service: Orthopedics;  Laterality: Left;  SUPINE, UPPER EXTERMITY DRAPE  . INTRAOPERATIVE TRANSESOPHAGEAL ECHOCARDIOGRAM N/A 12/04/2016   Procedure: INTRAOPERATIVE TRANSESOPHAGEAL ECHOCARDIOGRAM;  Surgeon: Melrose Nakayama, MD;  Location: [place];  Service: Open Heart Surgery;  Laterality: N/A;  . IR THORACENTESIS ASP PLEURAL SPACE W/IMG GUIDE  12/22/2016  . LEFT HEART CATH AND CORONARY ANGIOGRAPHY N/A 11/28/2016   Procedure: Left Heart Cath and Coronary Angiography;  Surgeon: Belva Crome, MD;  Location: [place] [facility];  Service: Cardiovascular;  Laterality: N/A;  . MAZE N/A 12/04/2016   Procedure: MAZE PROCEDURE;  Surgeon: Melrose Nakayama, MD;  Location: [facility];  Service: Open Heart Surgery;  Laterality: N/A;  . RIGHT HEART CATH N/A 01/02/2017   Procedure: Right Heart Cath;  Surgeon: Jolaine Artist, MD;  Location: [place] [facility]  [facility];  Service: Cardiovascular;  Laterality: N/A;  . SECONDARY CLOSURE OF WOUND Left 01/09/2013   Procedure: SECONDARY CLOSURE OF WOUND  LEFT ELBOW;  Surgeon: [name], MD;  [facility];  Service: Orthopedics;  Laterality: Left;  . TEE WITHOUT CARDIOVERSION N/A 09/20/2015   Procedure: TRANSESOPHAGEAL ECHOCARDIOGRAM (TEE);  Surgeon: Pixie Casino, MD;  Location: [facility] END[facility]COPY;  Service: Cardiovascular;  Laterality: N/A;    Current Medications: Current Meds  Medication Sig  . Blood Glucose Monitoring Suppl (TRUE METRIX METER) DEVI 1 each by Does not apply route 2 (two) times daily.  [name] ELIQUIS 5 MG TABS tablet TAKE ONE TABLET BY MOUTH TWICE A DAY  . furosemide (LASIX) 40 MG tablet TAKE ONE TABLET BY MOUTH DAILY  . glipiZIDE (GLUCOTROL) 5 MG tablet Take 1 tablet (5 mg total) by mouth daily.  [name] glucose blood (TRUE METRIX BLOOD GLUCOSE TEST) test strip Use as instructed twice daily  . losartan (COZAAR) 25 MG tablet TAKE ONE TABLET BY MOUTH DAILY  . ondansetron (ZOFRAN-ODT) 4 MG disintegrating tablet Take 1 tablet (4 mg total) by mouth every 8 (eight) hours as needed for nausea or vomiting.  . TRUEPLUS LANCETS 28G MISC Use as instructed twice daily.     Allergies:   Phenergan [promethazine hcl]   Social History   Socioeconomic History  . Marital status: Divorced    Spouse name: Not on file  . Number of children: Not on file  . Years of education: Not on file  . Highest education level: Not on file  Occupational History  . Not on file  Social Needs  . Financial resource strain: Not on file  . Food insecurity    Worry: Not on file    Inability: Not on file  . Transportation needs    Medical: Not on file    Non-medical: Not on file  Tobacco Use  . Smoking status: Former Smoker    Years: 4.00    Quit date: 12/31/1980    Years since quitting: 38.5  . Smokeless tobacco: Never Used  Substance and Sexual Activity  . Alcohol use: No  . Drug use: No  . Sexual activity: Not  Currently  Lifestyle  . Physical activity    Days per week: Not on file    Minutes per session: Not on file  . Stress: Not on file  Relationships  . Social Herbalist on phone: Not on file    Gets together: Not on file    Attends religious service: Not on file    Active member of club or organization: Not on file    Attends meetings of clubs or organizations: Not on file    Relationship status: Not on file  Other Topics Concern  . Not on file  Social History Narrative   Divorced.  Lives with daughter and grandson. 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     Family History: The patient's  family history includes Breast cancer in her mother; Diabetes in her father; Healthy in her sister; Heart disease in her sister; Stroke in her father.  ROS:   Please see the history of present illness.     All other systems reviewed and are negative.  EKGs/Labs/Other Studies Reviewed:    The following studies were reviewed today: Comprehensive defibrillator check performed in the office today  EKG:  EKG is  ordered today.  The ekg ordered today demonstrates normal sinus rhythm, rightward axis, nonspecific T wave changes in leads I, 2, aVL, QTC normal at 441 ms  Recent Labs: 07/05/2019: ALT 13; BUN 51; Creatinine, Ser 1.74; Hemoglobin 13.0; Platelets 301; Potassium 5.9; Sodium 141  Recent Lipid Panel    Component Value Date/Time   CHOL 183 07/05/2019 1137   TRIG 159 (H) 07/05/2019 1137   HDL 45 07/05/2019 1137   CHOLHDL 4.1 07/05/2019 1137   CHOLHDL 4.2 11/28/2016 0542   VLDL 18 11/28/2016 0542   LDLCALC 110 (H) 07/05/2019 1137    Physical Exam:    VS:  BP 110/64   Pulse 71   Ht 5\' 4"  (1.626 m)   Wt 188 lb (85.3 kg)   SpO2 98%   BMI 32.27 kg/m     Wt Readings from Last 3 Encounters:  07/05/19 188 lb (85.3 kg)  07/08/17 173 lb 14.4 oz (78.9 kg)  04/07/17 171 lb (77.6 kg)     GEN: Mildly obese, well nourished, well developed in no acute distress HEENT: Normal  NECK: No JVD; No carotid bruits LYMPHATICS: No lymphadenopathy CARDIAC: Healthy left subclavian defibrillator site; RRR, no murmurs, rubs, gallops RESPIRATORY:  Clear to auscultation without rales, wheezing or rhonchi  ABDOMEN: Soft, non-tender, non-distended MUSCULOSKELETAL:  No edema; No deformity  SKIN: Warm and dry NEUROLOGIC:  Alert and oriented x 3 PSYCHIATRIC:  Normal affect   ASSESSMENT:    1. Chronic systolic heart failure ([place])   2. Coronary artery disease involving native coronary artery of native heart without angina pectoris   3. PAF (paroxysmal atrial fibrillation) ([place])   4. Hypertrophic cardiomyopathy ([place])   5. Ventricular tachycardia ([place])   6. ICD (implantable cardioverter-defibrillator) in place   7. Hypercholesterolemia   8. Diabetes mellitus type 2 in obese [facility])    PLAN:    In order of problems listed above:  1. CHF: Appears euvolemic both clinically and by thoracic impedance monitor.  NYHA functional class I.  On a low-dose of loop diuretic.  Most recent LVEF 60-65%. 2. CAD: Denies angina pectoris at rest or with activity.  Notes that her presentation has generally been with arrhythmia and heart failure, rather than angina pectoris.  I am concerned that she is not receiving a statin right now, but need to recheck her lipid profile.  Glucose control is reportedly good.  She is not on aspirin since she is on full anticoagulation. 3. Afib: None recorded since 2018.  I do not think it is unreasonable to consider discontinuing her anticoagulant altogether, since she had what appears to be a successful Maze procedure and her left atrial appendage was clipped and we can monitor for arrhythmia recurrence using her device.  If we stop the anticoagulant, will start aspirin 81 mg daily.  Would like to make sure she will become compliant with device follow-up before making that decision. 4. HCM: Her echocardiogram has been described as showing hypertrophic cardiomyopathy,  probably with an apical variant, but without LVOT obstruction.  Ideally she should be on beta-blockers,  which are also indicated for her history of atrial and ventricular arrhythmia and CAD.  Dr. Clayborne Dana notes reports that "has not tolerated beta-blocker well".  Again, would like to first establish good rapport and a pattern of compliance with follow-up before making medication changes. 5. NSVT: Very few and infrequent and brief episodes of VT have been recorded by her device since her bypass surgery. 6. ICD: Normal device function.  Rather low voltage of sensed P waves but otherwise no issues.  Need to reestablish routine follow-up in the device clinic for remote downloads every 3 months. 7. HLP: Need to recheck fasting lipid profile.  We will strongly recommend statin therapy if LDL is not less than 70. 8. DM: She reports good glycemic control.  The most recent hemoglobin A1c available for review was 5.9%.  Currently on sulfonylurea, Jardiance would provide better long-term cardiovascular outcomes   Medication Adjustments/Labs and Tests Ordered: Current medicines are reviewed at length with the patient today.  Concerns regarding medicines are outlined above.  Orders Placed This Encounter  Procedures  . CBC  . Comprehensive metabolic panel  . Lipid panel  . Hemoglobin A1c  . EKG 12-Lead   No orders of the defined types were placed in this encounter.   Patient Instructions  Medication Instructions:  No changes *If you need a refill on your cardiac medications before your next appointment, please call your pharmacy*  Lab Work: Your provider would like for you to have the following labs today: Lipid, A1C, CMET, and CBC  If you have labs (blood work) drawn today and your tests are completely normal, you will receive your results only by: [name] MyChart Message (if you have MyChart) OR . A paper copy in the mail If you have any lab test that is abnormal or we need to change your treatment,  we will call you to review the results.  Testing/Procedures: None ordered  Follow-Up: At [facility], you and your health needs are our priority.  As part of our continuing mission to provide you with exceptional heart care, we have created designated Provider Care Teams.  These Care Teams include your primary Cardiologist (physician) and Advanced Practice Providers (APPs -  Physician Assistants and Nurse Practitioners) who all work together to provide you with the care you need, when you need it.  Your next appointment:   6 month(s)  The format for your next appointment:   In Person  Provider:   Sanda Klein, MD       Signed, Sanda Klein, MD  07/08/2019 9:26 AM    [place]

## 2019-07-05 NOTE — Patient Instructions (Signed)
Medication Instructions:  No changes *If you need a refill on your cardiac medications before your next appointment, please call your pharmacy*  Lab Work: Your provider would like for you to have the following labs today: Lipid, A1C, CMET, and CBC  If you have labs (blood work) drawn today and your tests are completely normal, you will receive your results only by: [name] MyChart Message (if you have MyChart) OR . A paper copy in the mail If you have any lab test that is abnormal or we need to change your treatment, we will call you to review the results.  Testing/Procedures: None ordered  Follow-Up: At [facility], you and your health needs are our priority.  As part of our continuing mission to provide you with exceptional heart care, we have created designated Provider Care Teams.  These Care Teams include your primary Cardiologist (physician) and Advanced Practice Providers (APPs -  Physician Assistants and Nurse Practitioners) who all work together to provide you with the care you need, when you need it.  Your next appointment:   6 month(s)  The format for your next appointment:   In Person  Provider:   Sanda Klein, MD

## 2019-07-05 NOTE — Telephone Encounter (Signed)
The patient has been made aware. She will hold her Losartan until further notice and come back mid next week for a repeat BMET. Orders placed.

## 2019-07-05 NOTE — Telephone Encounter (Signed)
Left a message for the patient to call back. Lab results received from [facility] showed a potassium of 5.9.  Per Dr. Sallyanne Kuster, the patient should stop the Losartan and recheck a BMET in one week.

## 2019-07-06 ENCOUNTER — Telehealth: Payer: Self-pay

## 2019-07-06 LAB — CBC
Hematocrit: 39.2 % (ref 34.0–46.6)
Hemoglobin: 13 g/dL (ref 11.1–15.9)
MCH: 29 pg (ref 26.6–33.0)
MCHC: 33.2 g/dL (ref 31.5–35.7)
MCV: 88 fL (ref 79–97)
Platelets: 301 10*3/uL (ref 150–450)
RBC: 4.48 x10E6/uL (ref 3.77–5.28)
RDW: 13.6 % (ref 11.7–15.4)
WBC: 9 10*3/uL (ref 3.4–10.8)

## 2019-07-06 LAB — COMPREHENSIVE METABOLIC PANEL
ALT: 13 IU/L (ref 0–32)
AST: 11 IU/L (ref 0–40)
Albumin/Globulin Ratio: 1.7 (ref 1.2–2.2)
Albumin: 4.3 g/dL (ref 3.7–4.7)
Alkaline Phosphatase: 103 IU/L (ref 39–117)
BUN/Creatinine Ratio: 29 — ABNORMAL HIGH (ref 12–28)
BUN: 51 mg/dL — ABNORMAL HIGH (ref 8–27)
Bilirubin Total: 0.3 mg/dL (ref 0.0–1.2)
CO2: 23 mmol/L (ref 20–29)
Calcium: 9.8 mg/dL (ref 8.7–10.3)
Chloride: 105 mmol/L (ref 96–106)
Creatinine, Ser: 1.74 mg/dL — ABNORMAL HIGH (ref 0.57–1.00)
GFR calc Af Amer: 34 mL/min/{1.73_m2} — ABNORMAL LOW (ref >59)
GFR calc non Af Amer: 29 mL/min/{1.73_m2} — ABNORMAL LOW (ref >59)
Globulin, Total: 2.6 g/dL (ref 1.5–4.5)
Glucose: 176 mg/dL — ABNORMAL HIGH (ref 65–99)
Potassium: 5.9 mmol/L (ref 3.5–5.2)
Sodium: 141 mmol/L (ref 134–144)
Total Protein: 6.9 g/dL (ref 6.0–8.5)

## 2019-07-06 LAB — LIPID PANEL
Chol/HDL Ratio: 4.1 ratio (ref 0.0–4.4)
Cholesterol, Total: 183 mg/dL (ref 100–199)
HDL: 45 mg/dL (ref >39)
LDL Chol Calc (NIH): 110 mg/dL — ABNORMAL HIGH (ref 0–99)
Triglycerides: 159 mg/dL — ABNORMAL HIGH (ref 0–149)
VLDL Cholesterol Cal: 28 mg/dL (ref 5–40)

## 2019-07-06 LAB — HEMOGLOBIN A1C
Est. average glucose Bld gHb Est-mCnc: 212 mg/dL
Hgb A1c MFr Bld: 9 % — ABNORMAL HIGH (ref 4.8–5.6)

## 2019-07-06 MED ORDER — ROSUVASTATIN CALCIUM 10 MG PO TABS: 10.0000 mg | ORAL_TABLET | Freq: Every day | ORAL | 3 refills | Status: DC

## 2019-07-06 NOTE — Telephone Encounter (Signed)
Left a message for the patient to call back.  Notes recorded by Sanda Klein, MD on 07/05/2019 at 3:47 PM EST   LDL is high, I would recommend starting a cholesterol med. Atorvastatin 40 mg daily should do the job, unless she had problems with it in the past.  CBC and A1c pending.

## 2019-07-06 NOTE — Telephone Encounter (Signed)
LMOVM for pt to call me about her home remote monitor.

## 2019-07-06 NOTE — Telephone Encounter (Signed)
Patient made aware of results and verbalized understanding.  Per Dr. Sallyanne Kuster, Rosuvastatin 10 mg daily has been sent in for her.

## 2019-07-06 NOTE — Telephone Encounter (Signed)
I gave the pt the number to [place] support.

## 2019-07-06 NOTE — Telephone Encounter (Signed)
-----   Message from Ricci Barker, RN sent at 07/06/2019  8:40 AM EST ----- Regarding: Troubleshooting Good Morning,  This patient needs help with her transmitter or needs a new transmitter please. She was seen in the office yesterday.  Thank you, Lattie Haw

## 2019-07-06 NOTE — Telephone Encounter (Signed)
Incoming call from [place]  CRITICAL VALUE: Potassium 5.9  RECEIVER (on-site recipient of call): Renato Gails, RN  DATE & TIME NOTIFIED: 10:37 am  MESSENGER (representative from lab): Cipriano Bunker  MD NOTIFIED: Sanda Klein, MD  TIME OF NOTIFICATION: 10:36 am  RESPONSE:

## 2019-07-06 NOTE — Telephone Encounter (Signed)
Spoke with Kathryne Sharper, RN working with Dr.Croitoru. She informed triage nurse that she was contacted yesterday regarding pt critical K+ lab value of 5.9 and this has already been addressed.

## 2019-07-08 ENCOUNTER — Encounter: Payer: Self-pay | Admitting: Cardiovascular Disease

## 2019-07-12 ENCOUNTER — Other Ambulatory Visit (HOSPITAL_COMMUNITY): Payer: Self-pay | Admitting: Internal Medicine

## 2019-07-14 ENCOUNTER — Telehealth: Payer: Self-pay

## 2019-07-14 NOTE — Telephone Encounter (Signed)
The pt states she has not call [facility] about her monitor yet. I told her to call as soon as possible.

## 2019-07-26 NOTE — Telephone Encounter (Signed)
I asked the pt would it be okay for me to have the [place] representative give her a call about her monitor. She agreed that the rep can call her at anytime. The pt states she now have transportation to get labs done on January 5th. The pt still not taking the Lasartin. She can tell the difference because she has more SOB. She states the SOB is not all the time. [name] also states her pcp put her on Astatin 10 mg for a month. The pt will like for someone to let her know if she should fast for her blood work or not. She also wanted to know if there anything other than Lasartin she can take? I told her I will send this to Dr. Sallyanne Kuster and his nurse for them to advised. The pt thanked me for the call.

## 2019-07-27 NOTE — Telephone Encounter (Signed)
Hold off til we get the labs

## 2019-07-27 NOTE — Telephone Encounter (Signed)
Left a message for the patient to call back.  

## 2019-08-03 NOTE — Telephone Encounter (Signed)
I spoke with the pt and she states she did receive her new monitor and cell adapter. She is going to call [facility] today to make sure it is set up properly. Her grandson is coming tomorrow to help her send the transmission. I told her to have a great new years. The pt thanked me for the call.

## 2019-08-09 DIAGNOSIS — E875 Hyperkalemia: Secondary | ICD-10-CM | POA: Diagnosis not present

## 2019-08-09 LAB — BASIC METABOLIC PANEL
BUN/Creatinine Ratio: 30 — ABNORMAL HIGH (ref 12–28)
BUN: 33 mg/dL — ABNORMAL HIGH (ref 8–27)
CO2: 23 mmol/L (ref 20–29)
Calcium: 9.1 mg/dL (ref 8.7–10.3)
Chloride: 105 mmol/L (ref 96–106)
Creatinine, Ser: 1.1 mg/dL — ABNORMAL HIGH (ref 0.57–1.00)
GFR calc Af Amer: 58 mL/min/{1.73_m2} — ABNORMAL LOW (ref >59)
GFR calc non Af Amer: 51 mL/min/{1.73_m2} — ABNORMAL LOW (ref >59)
Glucose: 192 mg/dL — ABNORMAL HIGH (ref 65–99)
Potassium: 5 mmol/L (ref 3.5–5.2)
Sodium: 140 mmol/L (ref 134–144)

## 2019-08-09 NOTE — Telephone Encounter (Signed)
I called to try and conference call [name] to get the pt monitor set up but she states she rather wait for her grandson.

## 2019-08-10 ENCOUNTER — Telehealth: Payer: Self-pay | Admitting: Cardiovascular Disease

## 2019-08-10 ENCOUNTER — Other Ambulatory Visit: Payer: Self-pay | Admitting: *Deleted

## 2019-08-10 NOTE — Telephone Encounter (Signed)
Called the pt back regarding her recent lab work.  The patient has been notified of the result and verbalized understanding.  All questions (if any) were answered. Wilma Flavin, RN 08/10/2019 11:29 AM

## 2019-08-10 NOTE — Telephone Encounter (Signed)
Patient is returning call for lab results. Please call.

## 2019-08-26 ENCOUNTER — Encounter: Payer: Self-pay | Admitting: Internal Medicine

## 2019-09-01 NOTE — Telephone Encounter (Signed)
Attempted to reach patient again.  LMOM.  We have been trying to get her monitor hooked up for several weeks. She last saw Dr Loletha Grayer 07/05/19.  If she is unable to hook her monitor up at home, we need her to come in for device clinic visit in march.  [place], please keep trying to reach patient to schedule device clinic appointment.  If unable to reach, please send certified letter.  Please ask her to bring her monitor with her to appointment and we will help set up.  Chanetta Marshall, NP 09/01/2019 8:43 AM

## 2019-09-07 ENCOUNTER — Encounter: Payer: Self-pay | Admitting: Cardiovascular Disease

## 2019-09-07 ENCOUNTER — Telehealth: Payer: Self-pay | Admitting: Cardiovascular Disease

## 2019-09-07 NOTE — Telephone Encounter (Signed)
Encounter made in error. 

## 2019-10-06 ENCOUNTER — Other Ambulatory Visit (HOSPITAL_COMMUNITY): Payer: Self-pay | Admitting: Internal Medicine

## 2019-11-25 DIAGNOSIS — R05 Cough: Secondary | ICD-10-CM | POA: Diagnosis not present

## 2019-11-28 DIAGNOSIS — R05 Cough: Secondary | ICD-10-CM | POA: Diagnosis not present

## 2019-12-04 ENCOUNTER — Inpatient Hospital Stay (HOSPITAL_COMMUNITY): Payer: PPO

## 2019-12-04 ENCOUNTER — Emergency Department (HOSPITAL_COMMUNITY): Payer: PPO

## 2019-12-04 ENCOUNTER — Encounter (HOSPITAL_COMMUNITY): Payer: Self-pay | Admitting: Internal Medicine

## 2019-12-04 ENCOUNTER — Inpatient Hospital Stay (HOSPITAL_COMMUNITY)
Admission: EM | Admit: 2019-12-04 | Discharge: 2019-12-10 | DRG: 291 | Disposition: A | Discharge status: Home Health | Payer: PPO | Attending: Internal Medicine | Admitting: Internal Medicine

## 2019-12-04 ENCOUNTER — Other Ambulatory Visit: Payer: Self-pay

## 2019-12-04 DIAGNOSIS — D630 Anemia in neoplastic disease: Secondary | ICD-10-CM | POA: Diagnosis present

## 2019-12-04 DIAGNOSIS — C7951 Secondary malignant neoplasm of bone: Secondary | ICD-10-CM | POA: Diagnosis present

## 2019-12-04 DIAGNOSIS — Z833 Family history of diabetes mellitus: Secondary | ICD-10-CM

## 2019-12-04 DIAGNOSIS — I10 Essential (primary) hypertension: Secondary | ICD-10-CM | POA: Diagnosis present

## 2019-12-04 DIAGNOSIS — E559 Vitamin D deficiency, unspecified: Secondary | ICD-10-CM | POA: Diagnosis present

## 2019-12-04 DIAGNOSIS — Z8674 Personal history of sudden cardiac arrest: Secondary | ICD-10-CM

## 2019-12-04 DIAGNOSIS — Z7901 Long term (current) use of anticoagulants: Secondary | ICD-10-CM

## 2019-12-04 DIAGNOSIS — C50919 Malignant neoplasm of unspecified site of unspecified female breast: Secondary | ICD-10-CM | POA: Diagnosis not present

## 2019-12-04 DIAGNOSIS — F329 Major depressive disorder, single episode, unspecified: Secondary | ICD-10-CM | POA: Diagnosis present

## 2019-12-04 DIAGNOSIS — M8458XA Pathological fracture in neoplastic disease, other specified site, initial encounter for fracture: Secondary | ICD-10-CM | POA: Diagnosis present

## 2019-12-04 DIAGNOSIS — M546 Pain in thoracic spine: Secondary | ICD-10-CM | POA: Diagnosis not present

## 2019-12-04 DIAGNOSIS — S22000A Wedge compression fracture of unspecified thoracic vertebra, initial encounter for closed fracture: Secondary | ICD-10-CM | POA: Diagnosis not present

## 2019-12-04 DIAGNOSIS — I422 Other hypertrophic cardiomyopathy: Secondary | ICD-10-CM | POA: Diagnosis not present

## 2019-12-04 DIAGNOSIS — E785 Hyperlipidemia, unspecified: Secondary | ICD-10-CM | POA: Diagnosis not present

## 2019-12-04 DIAGNOSIS — Z7189 Other specified counseling: Secondary | ICD-10-CM | POA: Diagnosis not present

## 2019-12-04 DIAGNOSIS — N179 Acute kidney failure, unspecified: Secondary | ICD-10-CM | POA: Diagnosis not present

## 2019-12-04 DIAGNOSIS — Z800 Family history of malignant neoplasm of digestive organs: Secondary | ICD-10-CM

## 2019-12-04 DIAGNOSIS — F419 Anxiety disorder, unspecified: Secondary | ICD-10-CM | POA: Diagnosis present

## 2019-12-04 DIAGNOSIS — N141 Nephropathy induced by other drugs, medicaments and biological substances: Secondary | ICD-10-CM | POA: Diagnosis not present

## 2019-12-04 DIAGNOSIS — Z951 Presence of aortocoronary bypass graft: Secondary | ICD-10-CM

## 2019-12-04 DIAGNOSIS — Z79899 Other long term (current) drug therapy: Secondary | ICD-10-CM

## 2019-12-04 DIAGNOSIS — I499 Cardiac arrhythmia, unspecified: Secondary | ICD-10-CM | POA: Diagnosis not present

## 2019-12-04 DIAGNOSIS — I5043 Acute on chronic combined systolic (congestive) and diastolic (congestive) heart failure: Secondary | ICD-10-CM | POA: Diagnosis not present

## 2019-12-04 DIAGNOSIS — Z20822 Contact with and (suspected) exposure to covid-19: Secondary | ICD-10-CM | POA: Diagnosis not present

## 2019-12-04 DIAGNOSIS — E119 Type 2 diabetes mellitus without complications: Secondary | ICD-10-CM | POA: Diagnosis not present

## 2019-12-04 DIAGNOSIS — M8448XG Pathological fracture, other site, subsequent encounter for fracture with delayed healing: Secondary | ICD-10-CM | POA: Diagnosis not present

## 2019-12-04 DIAGNOSIS — Z8249 Family history of ischemic heart disease and other diseases of the circulatory system: Secondary | ICD-10-CM

## 2019-12-04 DIAGNOSIS — S22070A Wedge compression fracture of T9-T10 vertebra, initial encounter for closed fracture: Secondary | ICD-10-CM | POA: Diagnosis not present

## 2019-12-04 DIAGNOSIS — I480 Paroxysmal atrial fibrillation: Secondary | ICD-10-CM | POA: Diagnosis not present

## 2019-12-04 DIAGNOSIS — R0789 Other chest pain: Secondary | ICD-10-CM | POA: Diagnosis not present

## 2019-12-04 DIAGNOSIS — C50911 Malignant neoplasm of unspecified site of right female breast: Secondary | ICD-10-CM | POA: Diagnosis not present

## 2019-12-04 DIAGNOSIS — N183 Chronic kidney disease, stage 3 unspecified: Secondary | ICD-10-CM | POA: Diagnosis present

## 2019-12-04 DIAGNOSIS — Z95810 Presence of automatic (implantable) cardiac defibrillator: Secondary | ICD-10-CM

## 2019-12-04 DIAGNOSIS — I214 Non-ST elevation (NSTEMI) myocardial infarction: Secondary | ICD-10-CM | POA: Diagnosis not present

## 2019-12-04 DIAGNOSIS — Z780 Asymptomatic menopausal state: Secondary | ICD-10-CM

## 2019-12-04 DIAGNOSIS — Z7984 Long term (current) use of oral hypoglycemic drugs: Secondary | ICD-10-CM | POA: Diagnosis not present

## 2019-12-04 DIAGNOSIS — Z8679 Personal history of other diseases of the circulatory system: Secondary | ICD-10-CM

## 2019-12-04 DIAGNOSIS — I509 Heart failure, unspecified: Secondary | ICD-10-CM | POA: Diagnosis not present

## 2019-12-04 DIAGNOSIS — Z87891 Personal history of nicotine dependence: Secondary | ICD-10-CM | POA: Diagnosis not present

## 2019-12-04 DIAGNOSIS — R112 Nausea with vomiting, unspecified: Secondary | ICD-10-CM | POA: Diagnosis not present

## 2019-12-04 DIAGNOSIS — I5023 Acute on chronic systolic (congestive) heart failure: Secondary | ICD-10-CM | POA: Diagnosis not present

## 2019-12-04 DIAGNOSIS — I4891 Unspecified atrial fibrillation: Secondary | ICD-10-CM

## 2019-12-04 DIAGNOSIS — I088 Other rheumatic multiple valve diseases: Secondary | ICD-10-CM | POA: Diagnosis present

## 2019-12-04 DIAGNOSIS — N1831 Chronic kidney disease, stage 3a: Secondary | ICD-10-CM | POA: Diagnosis not present

## 2019-12-04 DIAGNOSIS — R079 Chest pain, unspecified: Secondary | ICD-10-CM | POA: Diagnosis present

## 2019-12-04 DIAGNOSIS — I5040 Unspecified combined systolic (congestive) and diastolic (congestive) heart failure: Secondary | ICD-10-CM | POA: Diagnosis not present

## 2019-12-04 DIAGNOSIS — Z803 Family history of malignant neoplasm of breast: Secondary | ICD-10-CM | POA: Diagnosis not present

## 2019-12-04 DIAGNOSIS — I2510 Atherosclerotic heart disease of native coronary artery without angina pectoris: Secondary | ICD-10-CM | POA: Diagnosis present

## 2019-12-04 DIAGNOSIS — I4819 Other persistent atrial fibrillation: Secondary | ICD-10-CM | POA: Diagnosis present

## 2019-12-04 DIAGNOSIS — T508X5A Adverse effect of diagnostic agents, initial encounter: Secondary | ICD-10-CM | POA: Diagnosis not present

## 2019-12-04 DIAGNOSIS — I130 Hypertensive heart and chronic kidney disease with heart failure and stage 1 through stage 4 chronic kidney disease, or unspecified chronic kidney disease: Principal | ICD-10-CM | POA: Diagnosis present

## 2019-12-04 DIAGNOSIS — I4510 Unspecified right bundle-branch block: Secondary | ICD-10-CM | POA: Diagnosis not present

## 2019-12-04 DIAGNOSIS — Z888 Allergy status to other drugs, medicaments and biological substances status: Secondary | ICD-10-CM

## 2019-12-04 DIAGNOSIS — C50912 Malignant neoplasm of unspecified site of left female breast: Secondary | ICD-10-CM | POA: Diagnosis present

## 2019-12-04 DIAGNOSIS — I5021 Acute systolic (congestive) heart failure: Secondary | ICD-10-CM | POA: Diagnosis not present

## 2019-12-04 DIAGNOSIS — N63 Unspecified lump in unspecified breast: Secondary | ICD-10-CM | POA: Diagnosis not present

## 2019-12-04 DIAGNOSIS — Z6831 Body mass index (BMI) 31.0-31.9, adult: Secondary | ICD-10-CM

## 2019-12-04 DIAGNOSIS — E1122 Type 2 diabetes mellitus with diabetic chronic kidney disease: Secondary | ICD-10-CM | POA: Diagnosis not present

## 2019-12-04 DIAGNOSIS — J811 Chronic pulmonary edema: Secondary | ICD-10-CM | POA: Diagnosis not present

## 2019-12-04 DIAGNOSIS — Z823 Family history of stroke: Secondary | ICD-10-CM | POA: Diagnosis not present

## 2019-12-04 DIAGNOSIS — R634 Abnormal weight loss: Secondary | ICD-10-CM | POA: Diagnosis present

## 2019-12-04 DIAGNOSIS — Y92239 Unspecified place in hospital as the place of occurrence of the external cause: Secondary | ICD-10-CM | POA: Diagnosis not present

## 2019-12-04 DIAGNOSIS — I252 Old myocardial infarction: Secondary | ICD-10-CM | POA: Diagnosis not present

## 2019-12-04 DIAGNOSIS — I081 Rheumatic disorders of both mitral and tricuspid valves: Secondary | ICD-10-CM | POA: Diagnosis not present

## 2019-12-04 DIAGNOSIS — I951 Orthostatic hypotension: Secondary | ICD-10-CM | POA: Diagnosis not present

## 2019-12-04 DIAGNOSIS — M549 Dorsalgia, unspecified: Secondary | ICD-10-CM

## 2019-12-04 DIAGNOSIS — N6320 Unspecified lump in the left breast, unspecified quadrant: Secondary | ICD-10-CM | POA: Diagnosis not present

## 2019-12-04 DIAGNOSIS — K5900 Constipation, unspecified: Secondary | ICD-10-CM | POA: Diagnosis not present

## 2019-12-04 DIAGNOSIS — I5022 Chronic systolic (congestive) heart failure: Secondary | ICD-10-CM | POA: Diagnosis not present

## 2019-12-04 DIAGNOSIS — Z170 Estrogen receptor positive status [ER+]: Secondary | ICD-10-CM | POA: Diagnosis not present

## 2019-12-04 DIAGNOSIS — J90 Pleural effusion, not elsewhere classified: Secondary | ICD-10-CM | POA: Diagnosis not present

## 2019-12-04 DIAGNOSIS — Q231 Congenital insufficiency of aortic valve: Secondary | ICD-10-CM

## 2019-12-04 DIAGNOSIS — S22009A Unspecified fracture of unspecified thoracic vertebra, initial encounter for closed fracture: Secondary | ICD-10-CM

## 2019-12-04 DIAGNOSIS — D649 Anemia, unspecified: Secondary | ICD-10-CM | POA: Diagnosis not present

## 2019-12-04 DIAGNOSIS — R000 Tachycardia, unspecified: Secondary | ICD-10-CM | POA: Diagnosis not present

## 2019-12-04 DIAGNOSIS — I5020 Unspecified systolic (congestive) heart failure: Secondary | ICD-10-CM | POA: Diagnosis present

## 2019-12-04 DIAGNOSIS — J9601 Acute respiratory failure with hypoxia: Secondary | ICD-10-CM | POA: Diagnosis not present

## 2019-12-04 DIAGNOSIS — I5033 Acute on chronic diastolic (congestive) heart failure: Secondary | ICD-10-CM | POA: Diagnosis not present

## 2019-12-04 DIAGNOSIS — R928 Other abnormal and inconclusive findings on diagnostic imaging of breast: Secondary | ICD-10-CM | POA: Diagnosis not present

## 2019-12-04 DIAGNOSIS — Z79891 Long term (current) use of opiate analgesic: Secondary | ICD-10-CM

## 2019-12-04 LAB — BASIC METABOLIC PANEL
Anion gap: 12 (ref 5–15)
BUN: 20 mg/dL (ref 8–23)
CO2: 20 mmol/L — ABNORMAL LOW (ref 22–32)
Calcium: 9 mg/dL (ref 8.9–10.3)
Chloride: 106 mmol/L (ref 98–111)
Creatinine, Ser: 1.06 mg/dL — ABNORMAL HIGH (ref 0.44–1.00)
GFR calc Af Amer: >60 mL/min (ref >60)
GFR calc non Af Amer: 53 mL/min — ABNORMAL LOW (ref >60)
Glucose, Bld: 187 mg/dL — ABNORMAL HIGH (ref 70–99)
Potassium: 3.5 mmol/L (ref 3.5–5.1)
Sodium: 138 mmol/L (ref 135–145)

## 2019-12-04 LAB — POC SARS CORONAVIRUS 2 AG -  ED: SARS Coronavirus 2 Ag: NEGATIVE

## 2019-12-04 LAB — CBC
HCT: 37.6 % (ref 36.0–46.0)
Hemoglobin: 11.2 g/dL — ABNORMAL LOW (ref 12.0–15.0)
MCH: 26.4 pg (ref 26.0–34.0)
MCHC: 29.8 g/dL — ABNORMAL LOW (ref 30.0–36.0)
MCV: 88.7 fL (ref 80.0–100.0)
Platelets: 310 10*3/uL (ref 150–400)
RBC: 4.24 MIL/uL (ref 3.87–5.11)
RDW: 17.2 % — ABNORMAL HIGH (ref 11.5–15.5)
WBC: 8.6 10*3/uL (ref 4.0–10.5)
nRBC: 0.3 % — ABNORMAL HIGH (ref 0.0–0.2)

## 2019-12-04 LAB — HEMOGLOBIN A1C
Hgb A1c MFr Bld: 7.7 % — ABNORMAL HIGH (ref 4.8–5.6)
Mean Plasma Glucose: 174.29 mg/dL

## 2019-12-04 LAB — URINALYSIS, ROUTINE W REFLEX MICROSCOPIC
Bacteria, UA: NONE SEEN
Bilirubin Urine: NEGATIVE
Glucose, UA: NEGATIVE mg/dL
Hgb urine dipstick: NEGATIVE
Ketones, ur: NEGATIVE mg/dL
Nitrite: NEGATIVE
Protein, ur: 30 mg/dL — AB
Specific Gravity, Urine: 1.01 (ref 1.005–1.030)
pH: 5 (ref 5.0–8.0)

## 2019-12-04 LAB — TSH: TSH: 3.815 u[IU]/mL (ref 0.350–4.500)

## 2019-12-04 LAB — LIPID PANEL
Cholesterol: 105 mg/dL (ref 0–200)
HDL: 27 mg/dL — ABNORMAL LOW (ref >40)
LDL Cholesterol: 59 mg/dL (ref 0–99)
Total CHOL/HDL Ratio: 3.9 RATIO
Triglycerides: 93 mg/dL (ref <150)
VLDL: 19 mg/dL (ref 0–40)

## 2019-12-04 LAB — MAGNESIUM: Magnesium: 1.7 mg/dL (ref 1.7–2.4)

## 2019-12-04 LAB — CBG MONITORING, ED
Glucose-Capillary: 178 mg/dL — ABNORMAL HIGH (ref 70–99)
Glucose-Capillary: 166 mg/dL — ABNORMAL HIGH (ref 70–99)

## 2019-12-04 LAB — TROPONIN I (HIGH SENSITIVITY)
Troponin I (High Sensitivity): 15 ng/L (ref <18)
Troponin I (High Sensitivity): 16 ng/L (ref <18)

## 2019-12-04 LAB — BRAIN NATRIURETIC PEPTIDE: B Natriuretic Peptide: 581.1 pg/mL — ABNORMAL HIGH (ref 0.0–100.0)

## 2019-12-04 LAB — RESPIRATORY PANEL BY RT PCR (FLU A&B, COVID)
Influenza A by PCR: NEGATIVE
Influenza B by PCR: NEGATIVE
SARS Coronavirus 2 by RT PCR: NEGATIVE

## 2019-12-04 LAB — PHOSPHORUS: Phosphorus: 4.4 mg/dL (ref 2.5–4.6)

## 2019-12-04 LAB — GLUCOSE, CAPILLARY: Glucose-Capillary: 172 mg/dL — ABNORMAL HIGH (ref 70–99)

## 2019-12-04 MED ORDER — ACETAMINOPHEN 325 MG PO TABS
650.0000 mg | ORAL_TABLET | Freq: Four times a day (QID) | ORAL | Status: DC | PRN
  Administered 2019-12-05 – 2019-12-10 (×6): 650 mg via ORAL
  Filled 2019-12-04 (×6): qty 2

## 2019-12-04 MED ORDER — IOHEXOL 350 MG/ML SOLN
100.0000 mL | Freq: Once | INTRAVENOUS | Status: AC | PRN
  Administered 2019-12-04: 100 mL via INTRAVENOUS

## 2019-12-04 MED ORDER — MORPHINE SULFATE (PF) 2 MG/ML IV SOLN
2.0000 mg | INTRAVENOUS | Status: DC | PRN
  Administered 2019-12-04 – 2019-12-08 (×3): 2 mg via INTRAVENOUS
  Filled 2019-12-04 (×4): qty 1

## 2019-12-04 MED ORDER — BENZONATATE 100 MG PO CAPS
100.0000 mg | ORAL_CAPSULE | Freq: Once | ORAL | Status: AC
  Administered 2019-12-04: 100 mg via ORAL
  Filled 2019-12-04: qty 1

## 2019-12-04 MED ORDER — ONDANSETRON HCL 4 MG/2ML IJ SOLN
4.0000 mg | Freq: Once | INTRAMUSCULAR | Status: AC
  Administered 2019-12-04: 4 mg via INTRAVENOUS
  Filled 2019-12-04: qty 2

## 2019-12-04 MED ORDER — ACETAMINOPHEN 650 MG RE SUPP: 650.0000 mg | Freq: Four times a day (QID) | RECTAL | Status: DC | PRN

## 2019-12-04 MED ORDER — ONDANSETRON HCL 4 MG PO TABS: 4.0000 mg | ORAL_TABLET | Freq: Four times a day (QID) | ORAL | Status: DC | PRN

## 2019-12-04 MED ORDER — ONDANSETRON HCL 4 MG/2ML IJ SOLN: 4.0000 mg | Freq: Four times a day (QID) | INTRAMUSCULAR | Status: DC | PRN

## 2019-12-04 MED ORDER — FUROSEMIDE 10 MG/ML IJ SOLN
40.0000 mg | Freq: Once | INTRAMUSCULAR | Status: AC
  Administered 2019-12-04: 40 mg via INTRAVENOUS
  Filled 2019-12-04: qty 4

## 2019-12-04 MED ORDER — DILTIAZEM HCL 25 MG/5ML IV SOLN
10.0000 mg | Freq: Once | INTRAVENOUS | Status: AC
  Administered 2019-12-04: 10 mg via INTRAVENOUS
  Filled 2019-12-04: qty 5

## 2019-12-04 MED ORDER — FUROSEMIDE 10 MG/ML IJ SOLN
40.0000 mg | Freq: Two times a day (BID) | INTRAMUSCULAR | Status: DC
  Administered 2019-12-05: 40 mg via INTRAVENOUS
  Filled 2019-12-04: qty 4

## 2019-12-04 MED ORDER — MORPHINE SULFATE (PF) 4 MG/ML IV SOLN
4.0000 mg | Freq: Once | INTRAVENOUS | Status: AC
  Administered 2019-12-04: 4 mg via INTRAVENOUS
  Filled 2019-12-04: qty 1

## 2019-12-04 MED ORDER — INSULIN ASPART 100 UNIT/ML ~~LOC~~ SOLN: 0.0000 [IU] | Freq: Every day | SUBCUTANEOUS | Status: DC

## 2019-12-04 MED ORDER — INSULIN ASPART 100 UNIT/ML ~~LOC~~ SOLN
0.0000 [IU] | Freq: Three times a day (TID) | SUBCUTANEOUS | Status: DC
  Administered 2019-12-04: 3 [IU] via SUBCUTANEOUS
  Administered 2019-12-05 (×2): 2 [IU] via SUBCUTANEOUS
  Administered 2019-12-05: 3 [IU] via SUBCUTANEOUS
  Administered 2019-12-06: 2 [IU] via SUBCUTANEOUS
  Administered 2019-12-06: 3 [IU] via SUBCUTANEOUS
  Administered 2019-12-07 (×2): 2 [IU] via SUBCUTANEOUS
  Administered 2019-12-07: 3 [IU] via SUBCUTANEOUS
  Administered 2019-12-08: 2 [IU] via SUBCUTANEOUS
  Administered 2019-12-08: 11 [IU] via SUBCUTANEOUS
  Administered 2019-12-08: 5 [IU] via SUBCUTANEOUS
  Administered 2019-12-09: 2 [IU] via SUBCUTANEOUS
  Administered 2019-12-09 – 2019-12-10 (×2): 3 [IU] via SUBCUTANEOUS
  Administered 2019-12-10: 2 [IU] via SUBCUTANEOUS
  Administered 2019-12-10: 3 [IU] via SUBCUTANEOUS

## 2019-12-04 MED ORDER — APIXABAN 5 MG PO TABS
5.0000 mg | ORAL_TABLET | Freq: Two times a day (BID) | ORAL | Status: DC
  Administered 2019-12-04 – 2019-12-05 (×2): 5 mg via ORAL
  Filled 2019-12-04 (×2): qty 1

## 2019-12-04 NOTE — ED Provider Notes (Signed)
[facility] EMERGENCY DEPARTMENT Provider Note   CSN: [id number] Arrival date & time: 12/04/19  1241     History Chief Complaint  Patient presents with  . Back Pain  . Atrial Fibrillation    [name] is a 74 y.o. female.  The history is provided by the patient and medical records. No language interpreter was used.  Back Pain Atrial Fibrillation     74 year old female with history of CAD, paroxysmal atrial fibrillation, CHF, diabetes, V. tach status post ICD implant presenting complaining of cough and chest pain.  Patient report for the past month she has had cough productive with green sputum, sinus congestion, nausea, vomiting, diarrhea, weight loss, generalized fatigue, and most importantly pain to her upper back.  Pain is described as sharp achy sensation moderate in severity worse with movement and with cough.  She does endorse shortness of breath with her pain.  For the past few days she also noticed her heart rate has been faster than usual.  She denies fever chills no loss of taste or smell no leg swelling or calf pain no recent sick contact.  She has not had her Covid vaccination.  She mention been evaluated by her PCP for her complaint for the past several weeks.  States she was initially given Z-Pak that she took for the full duration with minimal improvement.  She then was prescribed Levaquin but states she was unable to tolerate the medication and did not take it much.  Patient report "I think I have pneumonia" she is currently on Eliquis for atrial fibrillation and has not missed any dose.  Past Medical History:  Diagnosis Date  . Abnormal PFT   . CAD (coronary artery disease), native coronary artery - 3 vessel 04/21/2015   a. NSTEMI 8-04/2015 felt 2/2 demand ischemia. b. 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  . Chronic diastolic CHF (congestive heart failure) ([place])   . Diabetes mellitus ([place])     . Diabetes mellitus without complication ([place])    dx on wed  . Hypertrophic cardiomyopathy ([place])   . Left atrial enlargement   . Mitral regurgitation    a. Echo 04/2015: moderate mitral regurgitation. b. F/u echo 05/2015: mild MR.  . NSTEMI (non-ST elevated myocardial infarction) ([place]) 11/27/2016  . Paroxysmal atrial fibrillation ([name])   . Shortness of breath dyspnea   . Tricuspid regurgitation    a. Echo 04/2015: Mod-severe TR. b. Not mentioned on echo 05/2015  . Ventricular tachycardia ([place])    a. s/p St. Jude ICD implanted 04/2015.    Patient Active Problem List   Diagnosis Date Noted  . Pressure injury of skin 12/05/2016  . 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 NSTEMI (non-ST elevated myocardial infarction) ([place]) 11/27/2016  . Ventricular fibrillation ([place]) 11/27/2016  . Head injury   . ICD (implantable cardioverter-defibrillator) discharge   . Diabetic neuropathy ([place]) 06/16/2016  . Chronic kidney disease 06/16/2016  . Fatigue 10/30/2015  . Afib ([place]) 10/02/2015  . Atrial flutter ([place]) 09/18/2015  . Chronic anticoagulation-Xarelto 09/18/2015  . Orthostatic hypotension   . ICD in place- St Jude   . HLD (hyperlipidemia) 05/11/2015  . Syncope 05/11/2015  . Abdominal pain 05/11/2015  . AKI (acute kidney injury) ([place])   . Nausea & vomiting   . Hypothyroidism 04/27/2015  . Chronic diastolic CHF (congestive heart failure) ([place])   . Abnormal PFT   . CAD (coronary artery disease), native coronary  artery - 3 vessel 04/21/2015  . Acute respiratory failure ([place])   . Ventricular tachycardia- Sept 2016- ICD 04/13/2015  . UTI (lower urinary tract infection) 03/31/2015  . Sepsis ([place]) 03/31/2015  . Atrial fibrillation with rapid ventricular response ([place]) 03/31/2015  . Congestive heart disease ([place])   . Hypertrophic cardiomyopathy ([place])   . Septic bursitis of elbow 02/03/2013  . Acute blood loss anemia 01/08/2013    Class: Acute  . Diabetes mellitus type 2, noninsulin dependent ([place])   .  Cellulitis 12/31/2012  . Fasting hyperglycemia 12/31/2012  . Leukocytosis 12/31/2012  . Hypotension 12/31/2012    Past Surgical History:  Procedure Laterality Date  . APPLICATION OF WOUND VAC Left 01/06/2013   Procedure: APPLICATION OF WOUND VAC X 2;  Surgeon: [name], MD;  Location: [facility];  Service: Orthopedics;  Laterality: Left;  left forearm  . APPLICATION OF WOUND VAC Left 01/09/2013   Procedure: APPLICATION OF WOUND VAC;  Surgeon: [name], MD;  Location: [facility];  Service: Orthopedics;  Laterality: Left;  . CARDIAC CATHETERIZATION N/A 04/19/2015   Procedure: Left Heart Cath and Coronary Angiography;  Surgeon: [name], MD;  Location: [facility] CV LAB;  Service: Cardiovascular;  Laterality: N/A;  . CARDIOVERSION N/A 09/20/2015   Procedure: CARDIOVERSION;  Surgeon: Pixie Casino, MD;  Location[facility] ENDOSCOPY;  Service: Cardiovascular;  Laterality: N/A;  . CLIPPING OF ATRIAL APPENDAGE Left 12/04/2016   Procedure: CLIPPING OF ATRIAL APPENDAGE USING ATRICURE 69 ATRICLIP;  Surgeon: Melrose Nakayama, MD;  Location: [facility];  Service: Open Heart Surgery;  Laterality: Left;  . CORONARY ARTERY BYPASS GRAFT N/A 12/04/2016   Procedure: CORONARY ARTERY BYPASS GRAFTING (CABG)x 4 WITH ENDOSCOPIC HARVESTING OF RIGHT SAPHENOUS VEIN;  Surgeon: Melrose Nakayama, MD;  Location: [facility];  Service: Open Heart Surgery;  Laterality: N/A;  . DENTAL SURGERY  Oct. 2011   several extractions, bone graft  . EP IMPLANTABLE DEVICE N/A 04/16/2015   Procedure: ICD Implant;  Surgeon: [name], MD;  Location: [facility] CV LAB;  Service: Cardiovascular;  Laterality: N/A;  . I & D EXTREMITY Left 01/06/2013   Procedure: IRRIGATION AND DEBRIDEMENT LEFT ELBOW AND LEFT FOREARM ;  Surgeon: [name], MD;  Location: [facility];  Service: Orthopedics;  Laterality: Left;  . INCISION AND DRAINAGE Left 01/09/2013   Procedure: REDO INCISION AND DRAINAGE LEFT ELBOW;  Surgeon: [name], MD;   Location: [facility];  Service: Orthopedics;  Laterality: Left;  SUPINE, UPPER EXTERMITY DRAPE  . INTRAOPERATIVE TRANSESOPHAGEAL ECHOCARDIOGRAM N/A 12/04/2016   Procedure: INTRAOPERATIVE TRANSESOPHAGEAL ECHOCARDIOGRAM;  Surgeon: Melrose Nakayama, MD;  Location: [facility];  Service: Open Heart Surgery;  Laterality: N/A;  . IR THORACENTESIS ASP PLEURAL SPACE W/IMG GUIDE  12/22/2016  . LEFT HEART CATH AND CORONARY ANGIOGRAPHY N/A 11/28/2016   Procedure: Left Heart Cath and Coronary Angiography;  Surgeon: Belva Crome, MD;  Location: [facility] CV LAB;  Service: Cardiovascular;  Laterality: N/A;  . MAZE N/A 12/04/2016   Procedure: MAZE PROCEDURE;  Surgeon: Melrose Nakayama, MD;  Location: [facility];  Service: Open Heart Surgery;  Laterality: N/A;  . RIGHT HEART CATH N/A 01/02/2017   Procedure: Right Heart Cath;  Surgeon: Jolaine Artist, MD;  Location: [facility] CV LAB;  Service: Cardiovascular;  Laterality: N/A;  . SECONDARY CLOSURE OF WOUND Left 01/09/2013   Procedure: SECONDARY CLOSURE OF WOUND  LEFT ELBOW;  Surgeon: [name], MD;  [facility];  Service: Orthopedics;  Laterality: Left;  . TEE WITHOUT CARDIOVERSION N/A 09/20/2015   Procedure: TRANSESOPHAGEAL ECHOCARDIOGRAM (TEE);  Surgeon: Pixie Casino, MD;  Location: [facility]COPY;  Service: Cardiovascular;  Laterality: N/A;     OB History    Gravida      Para      Term      Preterm      AB      Living  1     SAB      TAB      Ectopic      Multiple      Live Births              Family History  Problem Relation Age of Onset  . Breast cancer Mother   . Diabetes Father   . Stroke Father   . Heart disease Sister   . Healthy Sister     Social History   Tobacco Use  . Smoking status: Former Smoker    Years: 4.00    Quit date: 12/31/1980    Years since quitting: 38.9  . Smokeless tobacco: Never Used  Substance Use Topics  . Alcohol use: No  . Drug use: No    Home Medications Prior to Admission  medications   Medication Sig Start Date End Date Taking? Authorizing Provider  Blood Glucose Monitoring Suppl (TRUE METRIX METER) DEVI 1 each by Does not apply route 2 (two) times daily. 04/27/15   Charlott Rakes, MD  ELIQUIS 5 MG TABS tablet TAKE ONE TABLET BY MOUTH TWICE A DAY 10/07/19   Croitoru, Mihai, MD  furosemide (LASIX) 40 MG tablet TAKE ONE TABLET BY MOUTH DAILY 06/14/19   Bensimhon, Shaune Pascal, MD  glipiZIDE (GLUCOTROL) 5 MG tablet Take 1 tablet (5 mg total) by mouth daily. 06/16/16   Charlott Rakes, MD  glucose blood (TRUE METRIX BLOOD GLUCOSE TEST) test strip Use as instructed twice daily 04/27/15   Charlott Rakes, MD  ondansetron (ZOFRAN-ODT) 4 MG disintegrating tablet Take 1 tablet (4 mg total) by mouth every 8 (eight) hours as needed for nausea or vomiting. 02/10/17   Shirley Friar, PA-C  rosuvastatin (CRESTOR) 10 MG tablet Take 1 tablet (10 mg total) by mouth daily. 07/06/19 10/04/19  Croitoru, Mihai, MD  TRUEPLUS LANCETS 28G MISC Use as instructed twice daily. 04/27/15   Charlott Rakes, MD    Allergies    Phenergan [promethazine hcl]  Review of Systems   Review of Systems  Musculoskeletal: Positive for back pain.  All other systems reviewed and are negative.   Physical Exam Updated Vital Signs BP (!) 142/90 (BP Location: Left Arm)   Pulse (!) 101   Temp 97.7 F (36.5 C) (Oral)   Resp 16   Ht 5\' 4"  (AB-123456789 m)   Wt 76.7 kg   SpO2 95%   BMI 29.01 kg/m   Physical Exam Vitals and nursing note reviewed.  Constitutional:      General: She is not in acute distress.    Appearance: She is well-developed. She is obese.  HENT:     Head: Atraumatic.  Eyes:     Conjunctiva/sclera: Conjunctivae normal.  Cardiovascular:     Rate and Rhythm: Rhythm irregular.  Pulmonary:     Effort: Pulmonary effort is normal.     Breath sounds: Normal breath sounds. No wheezing, rhonchi or rales.  Chest:     Chest wall: Tenderness present.  Abdominal:     Palpations: Abdomen is  soft.  Tenderness: There is abdominal tenderness.  Musculoskeletal:        General: Tenderness (Tenderness to palpation of upper back throughout without focal point tenderness.  No significant midline spine tenderness) present.     Cervical back: Neck supple.  Skin:    Findings: No rash.  Neurological:     Mental Status: She is alert.  Psychiatric:        Mood and Affect: Mood normal.     ED Results / Procedures / Treatments   Labs (all labs ordered are listed, but only abnormal results are displayed) Labs Reviewed  BASIC METABOLIC PANEL - Abnormal; Notable for the following components:      Result Value   CO2 20 (*)    Glucose, Bld 187 (*)    Creatinine, Ser 1.06 (*)    GFR calc non Af Amer 53 (*)    All other components within normal limits  CBC - Abnormal; Notable for the following components:   Hemoglobin 11.2 (*)    MCHC 29.8 (*)    RDW 17.2 (*)    nRBC 0.3 (*)    All other components within normal limits  BRAIN NATRIURETIC PEPTIDE - Abnormal; Notable for the following components:   B Natriuretic Peptide 581.1 (*)    All other components within normal limits  CBG MONITORING, ED - Abnormal; Notable for the following components:   Glucose-Capillary 178 (*)    All other components within normal limits  RESPIRATORY PANEL BY RT PCR (FLU A&B, COVID)  POC SARS CORONAVIRUS 2 AG -  ED  TROPONIN I (HIGH SENSITIVITY)  TROPONIN I (HIGH SENSITIVITY)    EKG EKG Interpretation  Date/Time:  Sunday Dec 04 2019 12:51:04 EDT Ventricular Rate:  115 PR Interval:    QRS Duration: 112 QT Interval:  310 QTC Calculation: 429 R Axis:   100 Text Interpretation: Atrial fibrillation Incomplete right bundle branch block Probable anterior infarct, age indeterminate Confirmed by Pattricia Boss [id number]) on 12/04/2019 2:52:56 PM   Radiology DG Chest Port 1 View  Result Date: 12/04/2019 CLINICAL DATA:  Right-sided chest pain. EXAM: PORTABLE CHEST 1 VIEW COMPARISON:  02/10/2017 FINDINGS:  Stable heart size and appearance of pacing/ICD device and left atrial appendage clip. Status post prior CABG. Lungs demonstrate evidence pulmonary interstitial edema. No focal airspace consolidation, pleural fluid or pneumothorax identified. IMPRESSION: Pulmonary interstitial edema. Electronically Signed   By: Aletta Edouard M.D.   On: 12/04/2019 14:17    Procedures .Critical Care Performed by: Domenic Moras, PA-C Authorized by: Domenic Moras, PA-C   Critical care provider statement:    Critical care time (minutes):  35   Critical care was time spent personally by me on the following activities:  Discussions with consultants, evaluation of patient's response to treatment, examination of patient, ordering and performing treatments and interventions, ordering and review of laboratory studies, ordering and review of radiographic studies, pulse oximetry, re-evaluation of patient's condition, obtaining history from patient or surrogate and review of old charts   (including critical care time)  Medications Ordered in ED Medications  furosemide (LASIX) injection 40 mg (has no administration in time range)  benzonatate (TESSALON) capsule 100 mg (has no administration in time range)  diltiazem (CARDIZEM) injection 10 mg (has no administration in time range)  morphine 4 MG/ML injection 4 mg (4 mg Intravenous Given 12/04/19 1316)  ondansetron (ZOFRAN) injection 4 mg (4 mg Intravenous Given 12/04/19 1316)    ED Course  I have reviewed the triage vital signs and the nursing  notes.  Pertinent labs & imaging results that were available during my care of the patient were reviewed by me and considered in my medical decision making (see chart for details).    MDM Rules/Calculators/A&P                      BP (!) 139/94   Pulse 90   Temp 97.7 F (36.5 C) (Oral)   Resp (!) 23   Ht 5\' 4"  (1.626 m)   Wt 76.7 kg   SpO2 90%   BMI 29.01 kg/m   Final Clinical Impression(s) / ED Diagnoses Final diagnoses:    Upper back pain  Atrial fibrillation with RVR ([name])  Acute on chronic congestive heart failure, unspecified heart failure type ([place])    Rx / DC Orders ED Discharge Orders    None      1:11 PM Patient report cold symptoms including cough, back pain and muscle aches, and concern for potential pneumonia.  States she has been taking Z-Pak without relief, unable to tolerate Levaquin.  She has not had Covid testing or Covid vaccination due her constellations of symptoms, will check COVID-19 test, work-up initiated.  2:54 PM Initial Covid test is negative.  Elevated BNP of 581, improved from her baseline, chest x-ray shows pulmonary interstitial edema without any focal infiltrate concerning for pneumonia.  EKG shows atrial fibrillation with RVR.  Lasix was given.  Care discussed with Dr. Jeanell Sparrow  3:20 PM In the setting of new paroxysmal atrial fibrillation with RVR and CHF exacerbation, it may be prudent to have patient admitted for observation as her A. fib with RVR may contribute to her CHF.  Will give Cardizem for rate control as patient is not on any rate control medication.  Patient states she has not been eating atrial fibrillation for "quite a while".  She still endorse upper back pain is muscle skeletal in origin.  Will provide additional pain medication.  3:33 PM Appreciate consultation from [facility] Dr. Garner Gavel who agrees to see and admit pt but request cardiology to be consulted.  Will consult per request.    3:43 PM I have consulted oncall cardiologist Dr. Percival Spanish who agrees to see pt and will be available for consultation.    Domenic Moras, PA-C 12/04/19 1544    Pattricia Boss, MD 12/04/19 [phone]

## 2019-12-04 NOTE — H&P (Addendum)
History and Physical    [name] [id number] DOB: July 13, 1946 DOA: 12/04/2019  PCP: Maurice Small, MD  Patient coming from: Home  I have personally briefly reviewed patient's old medical records in [place]  Chief Complaint: Chest pain and upper back pain  HPI: [name] is a 74 y.o. female with medical history significant of paroxysmal A. fib on Eliquis, combined systolic and diastolic CHF, ventricular tachycardia status post ICD placed in 2013, coronary artery disease status post CABG, hypertension, hyperlipidemia, hypertrophic cardiomyopathy presents to emergency department due to worsening midsternal chest pain and upper back pain since 1 month.  Patient tells me that her symptoms started 1 month ago and its been getting worse since few days.  Reports midsternal chest pain which radiates to her upper back.  Reports that her upper back and so severe that she cannot move her arms.  She is complaining of productive cough with greenish sputum-was prescribed Levaquin by PCP which she could not tolerated it, she was started on Z-Pak which she finished as prescribed.  She continues to have upper back pain therefore she came to ER for further evaluation and management.  She reports shortness of breath, leg swelling, orthopnea, PND and nausea.  She takes Eliquis for A. fib and she tells me that she has been compliant with her medications.  Reports 20 pound of weight loss and 4 weeks.  Has decreased appetite.  She reports anxiety and depression due to pain however she denies any suicidal or homicidal thoughts.  Reports joint stiffness in her hands early in the morning.  She is tearful during exam.  She tells me that her mom died of PE secondary to underlying metastatic breast cancer.  She thinks that she has cancer.  She denies stomach pain, vomiting, diarrhea, urinary symptoms such as hematuria, dysuria, change in urinary frequency.  Her daughter stays with her.  No history of  smoking, alcohol, illicit drug use.  ED Course: Upon arrival to ED: Patient tachycardic, tachypneic, afebrile with no leukocytosis, BMP shows stable CKD stage III, BNP: 581 (less than baseline), troponin x1 -.  EKG shows A. fib.  POC COVID-19 negative.  Chest x-ray shows pulmonary interstitial edema.  Patient received Cardizem 10 mg, Lasix 40 mg, morphine, Zofran and Tessalon in ED.  EDP consulted cardiology.  [facility] hospitalist consulted for admission for A. fib with RVR and CHF exacerbation.  Review of Systems: As per HPI otherwise negative.    Past Medical History:  Diagnosis Date  . Abnormal PFT   . CAD (coronary artery disease), native coronary artery - 3 vessel 04/21/2015   a. NSTEMI 8-04/2015 felt 2/2 demand ischemia. b. 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  . Chronic diastolic CHF (congestive heart failure) ([place])   . Diabetes mellitus ([place])   . Diabetes mellitus without complication ([place])    dx on wed  . Hypertrophic cardiomyopathy ([place])   . Left atrial enlargement   . Mitral regurgitation    a. Echo 04/2015: moderate mitral regurgitation. b. F/u echo 05/2015: mild MR.  . NSTEMI (non-ST elevated myocardial infarction) ([place]) 11/27/2016  . Paroxysmal atrial fibrillation ([name])   . Shortness of breath dyspnea   . Tricuspid regurgitation    a. Echo 04/2015: Mod-severe TR. b. Not mentioned on echo 05/2015  . Ventricular tachycardia ([place])    a. s/p St. Jude ICD implanted 04/2015.    Past Surgical History:  Procedure Laterality Date  .  APPLICATION OF WOUND VAC Left 01/06/2013   Procedure: APPLICATION OF WOUND VAC X 2;  Surgeon: [name], MD;  Location: [facility];  Service: Orthopedics;  Laterality: Left;  left forearm  . APPLICATION OF WOUND VAC Left 01/09/2013   Procedure: APPLICATION OF WOUND VAC;  Surgeon: [name], MD;  Location: [facility];  Service: Orthopedics;  Laterality: Left;  . CARDIAC CATHETERIZATION N/A 04/19/2015    Procedure: Left Heart Cath and Coronary Angiography;  Surgeon: [name], MD;  Location: [place];  Service: Cardiovascular;  Laterality: N/A;  . CARDIOVERSION N/A 09/20/2015   Procedure: CARDIOVERSION;  Surgeon: Pixie Casino, MD;  Location: [facility];  Service: Cardiovascular;  Laterality: N/A;  . CLIPPING OF ATRIAL APPENDAGE Left 12/04/2016   Procedure: CLIPPING OF ATRIAL APPENDAGE USING ATRICURE 19 ATRICLIP;  Surgeon: Melrose Nakayama, MD;  Location: [place];  Service: Open Heart Surgery;  Laterality: Left;  . CORONARY ARTERY BYPASS GRAFT N/A 12/04/2016   Procedure: CORONARY ARTERY BYPASS GRAFTING (CABG)x 4 WITH ENDOSCOPIC HARVESTING OF RIGHT SAPHENOUS VEIN;  Surgeon: Melrose Nakayama, MD;  Location: [place];  Service: Open Heart Surgery;  Laterality: N/A;  . DENTAL SURGERY  Oct. 2011   several extractions, bone graft  . EP IMPLANTABLE DEVICE N/A 04/16/2015   Procedure: ICD Implant;  Surgeon: [name], MD;  Location: [place];  Service: Cardiovascular;  Laterality: N/A;  . I & D EXTREMITY Left 01/06/2013   Procedure: IRRIGATION AND DEBRIDEMENT LEFT ELBOW AND LEFT FOREARM ;  Surgeon: [name], MD;  Location: [facility];  Service: Orthopedics;  Laterality: Left;  . INCISION AND DRAINAGE Left 01/09/2013   Procedure: REDO INCISION AND DRAINAGE LEFT ELBOW;  Surgeon: [name], MD;  Location: [facility];  Service: Orthopedics;  Laterality: Left;  SUPINE, UPPER EXTERMITY DRAPE  . INTRAOPERATIVE TRANSESOPHAGEAL ECHOCARDIOGRAM N/A 12/04/2016   Procedure: INTRAOPERATIVE TRANSESOPHAGEAL ECHOCARDIOGRAM;  Surgeon: Melrose Nakayama, MD;  Location: [facility];  Service: Open Heart Surgery;  Laterality: N/A;  . IR THORACENTESIS ASP PLEURAL SPACE W/IMG GUIDE  12/22/2016  . LEFT HEART CATH AND CORONARY ANGIOGRAPHY N/A 11/28/2016   Procedure: Left Heart Cath and Coronary Angiography;  Surgeon: Belva Crome, MD;  Location: [facility];  Service: Cardiovascular;   Laterality: N/A;  . MAZE N/A 12/04/2016   Procedure: MAZE PROCEDURE;  Surgeon: Melrose Nakayama, MD;  Location: [facility];  Service: Open Heart Surgery;  Laterality: N/A;  . RIGHT HEART CATH N/A 01/02/2017   Procedure: Right Heart Cath;  Surgeon: Jolaine Artist, MD;  Location: [facility];  Service: Cardiovascular;  Laterality: N/A;  . SECONDARY CLOSURE OF WOUND Left 01/09/2013   Procedure: SECONDARY CLOSURE OF WOUND  LEFT ELBOW;  Surgeon: [name], MD;  [facility];  Service: Orthopedics;  Laterality: Left;  . TEE WITHOUT CARDIOVERSION N/A 09/20/2015   Procedure: TRANSESOPHAGEAL ECHOCARDIOGRAM (TEE);  Surgeon: Pixie Casino, MD;  Location: [facility]COPY;  Service: Cardiovascular;  Laterality: N/A;     reports that she quit smoking about 38 years ago. She quit after 4.00 years of use. She has never used smokeless tobacco. She reports that she does not drink alcohol or use drugs.  Allergies  Allergen Reactions  . Phenergan [Promethazine Hcl] Other (See Comments)    Restless    Family History  Problem Relation Age of Onset  . Breast cancer Mother   . Diabetes Father   . Stroke  Father   . Heart disease Sister   . Healthy Sister     Prior to Admission medications   Medication Sig Start Date End Date Taking? Authorizing Provider  Blood Glucose Monitoring Suppl (TRUE METRIX METER) DEVI 1 each by Does not apply route 2 (two) times daily. 04/27/15   Charlott Rakes, MD  ELIQUIS 5 MG TABS tablet TAKE ONE TABLET BY MOUTH TWICE A DAY 10/07/19   Croitoru, Mihai, MD  furosemide (LASIX) 40 MG tablet TAKE ONE TABLET BY MOUTH DAILY 06/14/19   Bensimhon, Shaune Pascal, MD  glipiZIDE (GLUCOTROL) 5 MG tablet Take 1 tablet (5 mg total) by mouth daily. 06/16/16   Charlott Rakes, MD  glucose blood (TRUE METRIX BLOOD GLUCOSE TEST) test strip Use as instructed twice daily 04/27/15   Charlott Rakes, MD  ondansetron (ZOFRAN-ODT) 4 MG disintegrating tablet Take 1 tablet (4 mg total) by mouth every 8  (eight) hours as needed for nausea or vomiting. 02/10/17   Shirley Friar, PA-C  rosuvastatin (CRESTOR) 10 MG tablet Take 1 tablet (10 mg total) by mouth daily. 07/06/19 10/04/19  Croitoru, Mihai, MD  TRUEPLUS LANCETS 28G MISC Use as instructed twice daily. 04/27/15   Charlott Rakes, MD    Physical Exam: Vitals:   12/04/19 1400 12/04/19 1430 12/04/19 1445 12/04/19 1500  BP: (!) 131/93 135/82 132/87 (!) 139/94  Pulse: (!) 102 (!) 103 92 90  Resp: (!) 24 (!) 27 (!) 26 (!) 23  Temp:      TempSrc:      SpO2: 96% (!) 89% (!) 86% 90%  Weight:      Height:        Constitutional: NAD, calm, comfortable, tearful/anxious  eyes: PERRL, lids and conjunctivae normal ENMT: Mucous membranes are moist. Posterior pharynx clear of any exudate or lesions.Normal dentition.  Neck: normal, supple, no masses, no thyromegaly Respiratory: clear to auscultation bilaterally, no wheezing, no crackles. Normal respiratory effort. No accessory muscle use.  Cardiovascular: Regular rate and rhythm, no murmurs / rubs / gallops.  Bilateral 2+ pitting edema positive. 2+ pedal pulses. No carotid bruits.  Abdomen: no tenderness, no masses palpated. No hepatosplenomegaly. Bowel sounds positive.  Musculoskeletal: no clubbing / cyanosis. No joint deformity upper and lower extremities. Good ROM, no contractures. Normal muscle tone.  Skin: no rashes, lesions, ulcers. No induration Neurologic: CN 2-12 grossly intact. Sensation intact, DTR normal. Strength 5/5 in all 4.  Psychiatric: Normal judgment and insight. Alert and oriented x 3.  Appears anxious   Labs on Admission: I have personally reviewed following labs and imaging studies  CBC: Recent Labs  Lab 12/04/19 1324  WBC 8.6  HGB 11.2*  HCT 37.6  MCV 88.7  PLT 99991111   Basic Metabolic Panel: Recent Labs  Lab 12/04/19 1324  NA 138  K 3.5  CL 106  CO2 20*  GLUCOSE 187*  BUN 20  CREATININE 1.06*  CALCIUM 9.0   GFR: Estimated Creatinine Clearance: 48.8  mL/min (A) (by C-G formula based on SCr of 1.06 mg/dL (H)). Liver Function Tests: No results for input(s): AST, ALT, ALKPHOS, BILITOT, PROT, ALBUMIN in the last 168 hours. No results for input(s): LIPASE, AMYLASE in the last 168 hours. No results for input(s): AMMONIA in the last 168 hours. Coagulation Profile: No results for input(s): INR, PROTIME in the last 168 hours. Cardiac Enzymes: No results for input(s): CKTOTAL, CKMB, CKMBINDEX, TROPONINI in the last 168 hours. BNP (last 3 results) No results for input(s): PROBNP in the last 8760 hours.  HbA1C: No results for input(s): HGBA1C in the last 72 hours. CBG: Recent Labs  Lab 12/04/19 1246  GLUCAP 178*   Lipid Profile: No results for input(s): CHOL, HDL, LDLCALC, TRIG, CHOLHDL, LDLDIRECT in the last 72 hours. Thyroid Function Tests: No results for input(s): TSH, T4TOTAL, FREET4, T3FREE, THYROIDAB in the last 72 hours. Anemia Panel: No results for input(s): VITAMINB12, FOLATE, FERRITIN, TIBC, IRON, RETICCTPCT in the last 72 hours. Urine analysis:    Component Value Date/Time   COLORURINE AMBER (A) 12/13/2016 2042   APPEARANCEUR CLOUDY (A) 12/13/2016 2042   LABSPEC 1.016 12/13/2016 2042   PHURINE 5.0 12/13/2016 2042   GLUCOSEU NEGATIVE 12/13/2016 2042   HGBUR LARGE (A) 12/13/2016 2042   BILIRUBINUR NEGATIVE 12/13/2016 2042   KETONESUR NEGATIVE 12/13/2016 2042   PROTEINUR 100 (A) 12/13/2016 2042   UROBILINOGEN 1.0 05/11/2015 2209   NITRITE NEGATIVE 12/13/2016 2042   LEUKOCYTESUR LARGE (A) 12/13/2016 2042    Radiological Exams on Admission: DG Chest Port 1 View  Result Date: 12/04/2019 CLINICAL DATA:  Right-sided chest pain. EXAM: PORTABLE CHEST 1 VIEW COMPARISON:  02/10/2017 FINDINGS: Stable heart size and appearance of pacing/ICD device and left atrial appendage clip. Status post prior CABG. Lungs demonstrate evidence pulmonary interstitial edema. No focal airspace consolidation, pleural fluid or pneumothorax identified.  IMPRESSION: Pulmonary interstitial edema. Electronically Signed   By: Aletta Edouard M.D.   On: 12/04/2019 14:17    EKG: Independently reviewed.  A. fib with RVR, right axis deviation.  No ST elevation or depression noted.  Assessment/Plan Principal Problem:   Chest pain Active Problems:   Diabetes mellitus type 2, noninsulin dependent ([name])   Systolic CHF ([name])   CAD (coronary artery disease)   HLD (hyperlipidemia)   Paroxysmal atrial fibrillation ([name])   CKD (chronic kidney disease) stage 3, GFR 30-59 ml/min   S/P CABG x 5   HTN (hypertension)    Chest pain/upper back pain: -Patient has extensive cardiac history including coronary artery disease status post CABG, A. fib, atrial tachycardia status post ICD placement, CHF. -Could be musculoskeletal as patient has been coughing?[name]  Finished outpatient Z-Pak.  Troponin x1 -.  POC COVID-19 negative.  EKG shows A. fib, no ST-T wave changes noted.  Low chances of PE as patient is on Eliquis.   -She is afebrile with no leukocytosis.  Chest x-ray is negative for pneumonia-shows pulmonary interstitial edema. -She received Cardizem 10 mg, Lasix 40 mg, morphine, Zofran and Tessalon in the ED -At stepdown unit for close monitoring.  Trend troponin.  On telemetry. -Tylenol/nitro/morphine as needed for pain control. -EDP consulted cardiology-await recommendations. -We will get CT angio of chest to rule out aortic dissection. -Monitor vitals closely  Acute on chronic combined systolic and diastolic CHF: -Patient reports shortness of breath, orthopnea, PND, chest x-ray shows pulmonary interstitial edema.  BNP: 581 (below baseline). -Reviewed echo from 06/22/2019 which showed ejection fraction of 45 to 50%.  Left ventricular hypertrophy, severe mitral annular calcification and mild aortic valve sclerosis without stenosis. -We will start patient on Lasix 40 IV twice daily monitor electrolytes closely -Strict INO's and daily weight.  A. fib with  RVR: -Reviewed EKG.  Troponin x1 -. -Received diltiazem 10 mg once in ED.  Patient has not tolerated beta-blocker in the past. -On telemetry.  Continue Eliquis.  History of ventricular tachycardia status post ICD placement: -Needs device interrogation.  On telemetry. -Monitor closely.  Diabetes mellitus: Check A1c -Started patient on sliding scale insulin  Hyperlipidemia: Check lipid panel -Continue  statin  CKD stage IIIa: Stable -Continue to monitor  Situational anxiety/depression: -She tells me that she is anxious and depressed due to her upper back pain. -Patient denies suicidal or homicidal thoughts. -Outpatient follow-up  Unintentional weight loss: -Patient reports 20 pound of weight loss in 4 weeks -Unknown etiology?  Could be due to underlying generalized anxiety/depression? -We will check TSH, HIV  DVT prophylaxis: Eliquis/SCD/TED Code Status: Initially she refused any intervention & wishes to be DNR then she changed her mind & wants to be full code. Family Communication: None present at bedside.  Plan of care discussed with patient in length and she verbalized understanding and agreed with it. Disposition Plan: To be determined Consults called: Cardiology by EDP Admission status: Inpatient   Mckinley Jewel MD [facility]  If 7PM-7AM, please contact night-coverage [email or web address] Password [facility]  12/04/2019, 3:40 PM

## 2019-12-04 NOTE — ED Triage Notes (Signed)
Pt here from home, called for what started one week ago as right ribcage pain, worsening over the last two days and has moved to upper back. Denies injury. Pt in afib RVR, rate 80-150. Hx of same, although not since 2018.

## 2019-12-04 NOTE — Consult Note (Addendum)
CARDIOLOGY CONSULT NOTE  Patient ID: [name] MRN: [id number] DOB/AGE: 02/15/1946 74 y.o.  Admit date: 12/04/2019 Primary Physician Maurice Small, MD Primary Cardiologist  Glori Bickers, MD Chief Complaint   Atrial fib with RVR Requesting  Dr. 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 Jude's ICD placed in 2013, atrial fibrillation/flutter with rapid ventricular rate.  She has required TEE cardioversion in the past.  She has had CABG for three-vessel coronary artery disease with a Maze procedure and left atrial clipping.  She was at one point sent home on home milrinone but the PICC line came out.  She has been worked up and negative for ATTR amyloid.  She presents today to the emergency room with cough and back pain.  She does have some infiltrate suggestive of edema on chest x-ray.  She thought she been more short of breath for couple of weeks although she is not describing new PND or orthopnea.  She had some mild lower extremity swelling.  She does note that her fibrillation is going on now but she would not be able to tell how long its been going on.  She does not think she is in fibrillation usually.  She has been having some cough nonproductive.  Her biggest complaint has been back pain.  This is been going on for several days.  It is upper back.  It is worse sitting up.  She is never had pain like this before.  She is not having any substernal chest pressure, neck or arm discomfort.   She is noted to be in atrial fibrillation with a rapid rate.  She is found to have an elevated BNP of 581.    Of note her last echocardiogram in November demonstrated an EF of 45 to 50%.  She had significant left ventricular hypertrophy.  She has calcified mitral valve and annulus and aortic valve sclerosis.    Past Medical History:  Diagnosis Date  . Abnormal PFT   . CAD (coronary artery  disease), native coronary artery - 3 vessel 04/21/2015   a. NSTEMI 8-04/2015 felt 2/2 demand ischemia. b. 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  . Chronic diastolic CHF (congestive heart failure) ([place])   . Diabetes mellitus ([place])   . Diabetes mellitus without complication ([place])    dx on wed  . Hypertrophic cardiomyopathy ([place])   . Left atrial enlargement   . Mitral regurgitation    a. Echo 04/2015: moderate mitral regurgitation. b. F/u echo 05/2015: mild MR.  . NSTEMI (non-ST elevated myocardial infarction) ([place]) 11/27/2016  . Paroxysmal atrial fibrillation ([name])   . Shortness of breath dyspnea   . Tricuspid regurgitation    a. Echo 04/2015: Mod-severe TR. b. Not mentioned on echo 05/2015  . Ventricular tachycardia ([place])    a. s/p St. Jude ICD implanted 04/2015.    Past Surgical History:  Procedure Laterality Date  . APPLICATION OF WOUND VAC Left 01/06/2013   Procedure: APPLICATION OF WOUND VAC X 2;  Surgeon: [name], MD;  Location: [facility];  Service: Orthopedics;  Laterality: Left;  left forearm  . APPLICATION OF WOUND VAC Left 01/09/2013   Procedure: APPLICATION OF WOUND VAC;  Surgeon: [name], MD;  Location: [facility];  Service: Orthopedics;  Laterality: Left;  . CARDIAC CATHETERIZATION N/A 04/19/2015   Procedure: Left Heart Cath and Coronary Angiography;  Surgeon: [name], MD;  Location: [place] [facility];  Service: Cardiovascular;  Laterality: N/A;  . CARDIOVERSION N/A 09/20/2015   Procedure: CARDIOVERSION;  Surgeon: Pixie Casino, MD;  Location: [facility] END[facility];  Service: Cardiovascular;  Laterality: N/A;  . CLIPPING OF ATRIAL APPENDAGE Left 12/04/2016   Procedure: CLIPPING OF ATRIAL APPENDAGE USING ATRICURE 79 ATRICLIP;  Surgeon: Melrose Nakayama, MD;  Location: [place];  Service: Open Heart Surgery;  Laterality: Left;  . CORONARY ARTERY BYPASS GRAFT N/A 12/04/2016   Procedure: CORONARY ARTERY BYPASS GRAFTING  (CABG)x 4 WITH ENDOSCOPIC HARVESTING OF RIGHT SAPHENOUS VEIN;  Surgeon: Melrose Nakayama, MD;  Location: [place];  Service: Open Heart Surgery;  Laterality: N/A;  . DENTAL SURGERY  Oct. 2011   several extractions, bone graft  . EP IMPLANTABLE DEVICE N/A 04/16/2015   Procedure: ICD Implant;  Surgeon: [name], MD;  Location: [place] [facility];  Service: Cardiovascular;  Laterality: N/A;  . I & D EXTREMITY Left 01/06/2013   Procedure: IRRIGATION AND DEBRIDEMENT LEFT ELBOW AND LEFT FOREARM ;  Surgeon: [name], MD;  Location: [facility];  Service: Orthopedics;  Laterality: Left;  . INCISION AND DRAINAGE Left 01/09/2013   Procedure: REDO INCISION AND DRAINAGE LEFT ELBOW;  Surgeon: [name], MD;  Location: [facility];  Service: Orthopedics;  Laterality: Left;  SUPINE, UPPER EXTERMITY DRAPE  . INTRAOPERATIVE TRANSESOPHAGEAL ECHOCARDIOGRAM N/A 12/04/2016   Procedure: INTRAOPERATIVE TRANSESOPHAGEAL ECHOCARDIOGRAM;  Surgeon: Melrose Nakayama, MD;  Location: [place];  Service: Open Heart Surgery;  Laterality: N/A;  . IR THORACENTESIS ASP PLEURAL SPACE W/IMG GUIDE  12/22/2016  . LEFT HEART CATH AND CORONARY ANGIOGRAPHY N/A 11/28/2016   Procedure: Left Heart Cath and Coronary Angiography;  Surgeon: Belva Crome, MD;  Location: [place] [facility];  Service: Cardiovascular;  Laterality: N/A;  . MAZE N/A 12/04/2016   Procedure: MAZE PROCEDURE;  Surgeon: Melrose Nakayama, MD;  Location: [place];  Service: Open Heart Surgery;  Laterality: N/A;  . RIGHT HEART CATH N/A 01/02/2017   Procedure: Right Heart Cath;  Surgeon: Jolaine Artist, MD;  Location: [place] [facility];  Service: Cardiovascular;  Laterality: N/A;  . SECONDARY CLOSURE OF WOUND Left 01/09/2013   Procedure: SECONDARY CLOSURE OF WOUND  LEFT ELBOW;  Surgeon: [name], MD;  Location: [facility];  Service: Orthopedics;  Laterality: Left;  . TEE WITHOUT CARDIOVERSION N/A 09/20/2015   Procedure: TRANSESOPHAGEAL ECHOCARDIOGRAM (TEE);  Surgeon:  Pixie Casino, MD;  Location: [facility] ENDOSCOPY;  Service: Cardiovascular;  Laterality: N/A;    Allergies  Allergen Reactions  . Phenergan [Promethazine Hcl] Other (See Comments)    Restless   Prior to Admission medications   Medication Sig Start Date End Date Taking? Authorizing Provider  Blood Glucose Monitoring Suppl (TRUE METRIX METER) DEVI 1 each by Does not apply route 2 (two) times daily. 04/27/15   Charlott Rakes, MD  ELIQUIS 5 MG TABS tablet TAKE ONE TABLET BY MOUTH TWICE A DAY 10/07/19   Croitoru, Mihai, MD  furosemide (LASIX) 40 MG tablet TAKE ONE TABLET BY MOUTH DAILY 06/14/19   Bensimhon, Shaune Pascal, MD  glipiZIDE (GLUCOTROL) 5 MG tablet Take 1 tablet (5 mg total) by mouth daily. 06/16/16   Charlott Rakes, MD  glucose blood (TRUE METRIX BLOOD GLUCOSE TEST) test strip Use as instructed twice daily 04/27/15   Charlott Rakes, MD  ondansetron (ZOFRAN-ODT) 4 MG disintegrating tablet Take 1 tablet (4 mg total) by mouth every 8 (  eight) hours as needed for nausea or vomiting. 02/10/17   Shirley Friar, PA-C  rosuvastatin (CRESTOR) 10 MG tablet Take 1 tablet (10 mg total) by mouth daily. 07/06/19 10/04/19  Croitoru, Mihai, MD  TRUEPLUS LANCETS 28G MISC Use as instructed twice daily. 04/27/15   Charlott Rakes, MD    Family History  Problem Relation Age of Onset  . Breast cancer Mother   . Diabetes Father   . Stroke Father   . Heart disease Sister   . Healthy Sister     Social History   Socioeconomic History  . Marital status: Divorced    Spouse name: Not on file  . Number of children: Not on file  . Years of education: Not on file  . Highest education level: Not on file  Occupational History  . Not on file  Tobacco Use  . Smoking status: Former Smoker    Years: 4.00    Quit date: 12/31/1980    Years since quitting: 38.9  . Smokeless tobacco: Never Used  Substance and Sexual Activity  . Alcohol use: No  . Drug use: No  . Sexual activity: Not Currently  Other Topics  Concern  . Not on file  Social History Narrative   Divorced.  Lives with daughter and grandson. 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   Social Determinants of Health   Financial Resource Strain:   . Difficulty of Paying Living Expenses:   Food Insecurity:   . Worried About Charity fundraiser in the Last Year:   . Arboriculturist in the Last Year:   Transportation Needs:   . Film/video editor (Medical):   [name] Lack of Transportation (Non-Medical):   Physical Activity:   . Days of Exercise per Week:   . Minutes of Exercise per Session:   Stress:   . Feeling of Stress :   Social Connections:   . Frequency of Communication with Friends and Family:   . Frequency of Social Gatherings with Friends and Family:   . Attends Religious Services:   . Active Member of Clubs or Organizations:   . Attends Archivist Meetings:   [name] Marital Status:   Intimate Partner Violence:   . Fear of Current or Ex-Partner:   . Emotionally Abused:   [name] Physically Abused:   . Sexually Abused:      ROS:    As stated in the HPI and negative for all other systems.  Physical Exam: Blood pressure (!) 118/102, pulse 89, temperature 97.7 F (36.5 C), temperature source Oral, resp. rate (!) 21, height 5\' 4"  (1.626 m), weight 76.7 kg, SpO2 96 %.  GENERAL:  Well appearing HEENT:  Pupils equal round and reactive, fundi not visualized, oral mucosa unremarkable NECK:  No jugular venous distention, waveform within normal limits, carotid upstroke brisk and symmetric, no bruits, no thyromegaly LYMPHATICS:  No cervical, inguinal adenopathy LUNGS:  Clear to auscultation bilaterally BACK:  No CVA tenderness CHEST: Well-healed surgical scar ICD pocket HEART:  PMI not displaced or sustained,S1 and S2 within normal limits, no S3, no clicks, no rubs, no murmurs, irregular ABD:  Flat, positive bowel sounds normal in frequency in pitch, no bruits, no rebound, no guarding, no midline pulsatile mass, no  hepatomegaly, no splenomegaly EXT:  2 plus pulses throughout, trace edema, no cyanosis no clubbing SKIN:  No rashes no nodules NEURO:  Cranial nerves II through XII grossly intact, motor grossly intact throughout PSYCH:  Cognitively intact,  oriented to person place and time   Labs: Lab Results  Component Value Date   BUN 20 12/04/2019   Lab Results  Component Value Date   CREATININE 1.06 (H) 12/04/2019   Lab Results  Component Value Date   NA 138 12/04/2019   K 3.5 12/04/2019   CL 106 12/04/2019   CO2 20 (L) 12/04/2019   Lab Results  Component Value Date   TROPONINI 1.29 (HH) 11/28/2016   Lab Results  Component Value Date   WBC 8.6 12/04/2019   HGB 11.2 (L) 12/04/2019   HCT 37.6 12/04/2019   MCV 88.7 12/04/2019   PLT 310 12/04/2019   Lab Results  Component Value Date   CHOL 183 07/05/2019   HDL 45 07/05/2019   LDLCALC 110 (H) 07/05/2019   TRIG 159 (H) 07/05/2019   CHOLHDL 4.1 07/05/2019   Lab Results  Component Value Date   ALT 13 07/05/2019   AST 11 07/05/2019   ALKPHOS 103 07/05/2019   BILITOT 0.3 07/05/2019   Lab Results  Component Value Date   HGBA1C 9.0 (H) 07/05/2019   Lab Results  Component Value Date   CHOL 183 07/05/2019   TRIG 159 (H) 07/05/2019   HDL 45 07/05/2019   LDLCALC 110 (H) 07/05/2019     Radiology:   CXR: Interstitial edema  EKG:    Atrial fibrillation with rapid ventricular rate, right axis deviation, nonspecific T wave changes, poor anterior R wave progression  ASSESSMENT AND PLAN:   ICD: I did review the most recent follow-up with Dr. Sallyanne Kuster.  She has had evidence of paroxysmal atrial tachycardia.  There was also evidence of ventricular tachycardia.  Looks like they had trouble hooking her up and I do not see a more recent transtelephonic device transmission.  She needs the device to be interrogated and we can call tomorrow.  CHRONIC SYSTOLIC AND DIASTOLIC HF: I did review Dr. Clayborne Dana last note from June of last year.   He did note that the patient was doing well.  He continued very low-dose ARB and noted that she had not previously tolerated beta-blocker.  However, it appears that she had some renal insufficiency earlier this year or late last year and there is mention in some nursing notes that she was to discontinue the Cozaar.  However, the patient wants to talk to Dr. Haroldine Laws about this because she thought she was doing much better on that medication.  ATRIAL FIB:   She is on chronic Eliquis.  It appears from looking at the EP notes that she has paroxysmal atrial fibrillation.  However, she has not tolerated rate control medications apparently.  At this point I would manage conservatively diuresing and see how her heart rate does.  She needs device interrogation as above.    DM: Last A1c is elevated as above.  I would suggest SGLT2 inhibitor or GLP-1 receptor antagonist but will defer to primary team.  DYSLIPIDEMIA: LDL was not at target last time.  I would repeat this and suggest increasing the Lipitor if still not at target.  BACK PAIN:  Etiology is not clear although it is positional and there is some tenderness to palpation. She has had knee pain recently and has had to use a walker and this could be contributing.  Low suspicion for PE or dissection.     SignedMinus Breeding 12/04/2019, 3:47 PM

## 2019-12-05 ENCOUNTER — Encounter (HOSPITAL_COMMUNITY): Payer: Self-pay | Admitting: Internal Medicine

## 2019-12-05 ENCOUNTER — Encounter (HOSPITAL_COMMUNITY): Payer: Self-pay | Admitting: Certified Registered"

## 2019-12-05 DIAGNOSIS — I5033 Acute on chronic diastolic (congestive) heart failure: Secondary | ICD-10-CM

## 2019-12-05 DIAGNOSIS — Z7901 Long term (current) use of anticoagulants: Secondary | ICD-10-CM

## 2019-12-05 DIAGNOSIS — I4819 Other persistent atrial fibrillation: Secondary | ICD-10-CM | POA: Diagnosis not present

## 2019-12-05 DIAGNOSIS — I480 Paroxysmal atrial fibrillation: Secondary | ICD-10-CM

## 2019-12-05 DIAGNOSIS — I10 Essential (primary) hypertension: Secondary | ICD-10-CM

## 2019-12-05 DIAGNOSIS — D649 Anemia, unspecified: Secondary | ICD-10-CM

## 2019-12-05 DIAGNOSIS — I5040 Unspecified combined systolic (congestive) and diastolic (congestive) heart failure: Secondary | ICD-10-CM

## 2019-12-05 DIAGNOSIS — J90 Pleural effusion, not elsewhere classified: Secondary | ICD-10-CM

## 2019-12-05 DIAGNOSIS — R928 Other abnormal and inconclusive findings on diagnostic imaging of breast: Secondary | ICD-10-CM

## 2019-12-05 DIAGNOSIS — E119 Type 2 diabetes mellitus without complications: Secondary | ICD-10-CM

## 2019-12-05 DIAGNOSIS — S22070A Wedge compression fracture of T9-T10 vertebra, initial encounter for closed fracture: Secondary | ICD-10-CM

## 2019-12-05 LAB — GLUCOSE, CAPILLARY
Glucose-Capillary: 150 mg/dL — ABNORMAL HIGH (ref 70–99)
Glucose-Capillary: 132 mg/dL — ABNORMAL HIGH (ref 70–99)
Glucose-Capillary: 162 mg/dL — ABNORMAL HIGH (ref 70–99)
Glucose-Capillary: 163 mg/dL — ABNORMAL HIGH (ref 70–99)

## 2019-12-05 LAB — CBC
HCT: 37.1 % (ref 36.0–46.0)
Hemoglobin: 11.1 g/dL — ABNORMAL LOW (ref 12.0–15.0)
MCH: 26.5 pg (ref 26.0–34.0)
MCHC: 29.9 g/dL — ABNORMAL LOW (ref 30.0–36.0)
MCV: 88.5 fL (ref 80.0–100.0)
Platelets: 328 10*3/uL (ref 150–400)
RBC: 4.19 MIL/uL (ref 3.87–5.11)
RDW: 17.3 % — ABNORMAL HIGH (ref 11.5–15.5)
WBC: 8.3 10*3/uL (ref 4.0–10.5)
nRBC: 0.4 % — ABNORMAL HIGH (ref 0.0–0.2)

## 2019-12-05 LAB — COMPREHENSIVE METABOLIC PANEL
ALT: 28 U/L (ref 0–44)
AST: 33 U/L (ref 15–41)
Albumin: 3.3 g/dL — ABNORMAL LOW (ref 3.5–5.0)
Alkaline Phosphatase: 103 U/L (ref 38–126)
Anion gap: 14 (ref 5–15)
BUN: 18 mg/dL (ref 8–23)
CO2: 20 mmol/L — ABNORMAL LOW (ref 22–32)
Calcium: 9.1 mg/dL (ref 8.9–10.3)
Chloride: 108 mmol/L (ref 98–111)
Creatinine, Ser: 0.98 mg/dL (ref 0.44–1.00)
GFR calc Af Amer: >60 mL/min (ref >60)
GFR calc non Af Amer: 58 mL/min — ABNORMAL LOW (ref >60)
Glucose, Bld: 134 mg/dL — ABNORMAL HIGH (ref 70–99)
Potassium: 3.8 mmol/L (ref 3.5–5.1)
Sodium: 142 mmol/L (ref 135–145)
Total Bilirubin: 1.2 mg/dL (ref 0.3–1.2)
Total Protein: 6.7 g/dL (ref 6.5–8.1)

## 2019-12-05 LAB — LIPID PANEL
Cholesterol: 104 mg/dL (ref 0–200)
HDL: 25 mg/dL — ABNORMAL LOW (ref >40)
LDL Cholesterol: 61 mg/dL (ref 0–99)
Total CHOL/HDL Ratio: 4.2 RATIO
Triglycerides: 90 mg/dL (ref <150)
VLDL: 18 mg/dL (ref 0–40)

## 2019-12-05 LAB — VITAMIN D 25 HYDROXY (VIT D DEFICIENCY, FRACTURES): Vit D, 25-Hydroxy: 14.08 ng/mL — ABNORMAL LOW (ref 30–100)

## 2019-12-05 LAB — HIV ANTIBODY (ROUTINE TESTING W REFLEX): HIV Screen 4th Generation wRfx: NONREACTIVE

## 2019-12-05 MED ORDER — HEPARIN (PORCINE) 25000 UT/250ML-% IV SOLN
950.0000 [IU]/h | INTRAVENOUS | Status: DC
  Administered 2019-12-05: 1000 [IU]/h via INTRAVENOUS
  Administered 2019-12-07: 850 [IU]/h via INTRAVENOUS
  Administered 2019-12-08: 950 [IU]/h via INTRAVENOUS
  Filled 2019-12-05 (×3): qty 250

## 2019-12-05 MED ORDER — APIXABAN 5 MG PO TABS: 5.0000 mg | ORAL_TABLET | Freq: Two times a day (BID) | ORAL | Status: DC

## 2019-12-05 MED ORDER — AMIODARONE HCL 200 MG PO TABS: 400.0000 mg | ORAL_TABLET | Freq: Two times a day (BID) | ORAL | Status: DC

## 2019-12-05 MED ORDER — POLYETHYLENE GLYCOL 3350 17 G PO PACK
17.0000 g | PACK | Freq: Every day | ORAL | Status: DC | PRN
  Administered 2019-12-07: 17 g via ORAL
  Filled 2019-12-05: qty 1

## 2019-12-05 MED ORDER — VITAMIN D (ERGOCALCIFEROL) 1.25 MG (50000 UNIT) PO CAPS
50000.0000 [IU] | ORAL_CAPSULE | ORAL | Status: DC
  Administered 2019-12-05: 50000 [IU] via ORAL
  Filled 2019-12-05: qty 1

## 2019-12-05 MED ORDER — OXYCODONE HCL 5 MG PO TABS
5.0000 mg | ORAL_TABLET | ORAL | Status: DC | PRN
  Administered 2019-12-05 – 2019-12-10 (×11): 5 mg via ORAL
  Filled 2019-12-05 (×11): qty 1

## 2019-12-05 MED ORDER — FUROSEMIDE 10 MG/ML IJ SOLN
60.0000 mg | Freq: Two times a day (BID) | INTRAMUSCULAR | Status: DC
  Administered 2019-12-05: 60 mg via INTRAVENOUS
  Filled 2019-12-05: qty 6

## 2019-12-05 MED ORDER — METOPROLOL SUCCINATE ER 25 MG PO TB24
25.0000 mg | ORAL_TABLET | Freq: Every day | ORAL | Status: DC
  Filled 2019-12-05 (×4): qty 1

## 2019-12-05 MED ORDER — SENNOSIDES-DOCUSATE SODIUM 8.6-50 MG PO TABS
2.0000 | ORAL_TABLET | Freq: Every evening | ORAL | Status: DC | PRN
  Administered 2019-12-07: 2 via ORAL
  Filled 2019-12-05: qty 2

## 2019-12-05 NOTE — Consult Note (Addendum)
[place]  [phone] [phone]   MEDICAL ONCOLOGY - INITIAL CONSULTATION  Referral MD: Dr. Gerlean Ren  Reason for Referral: Left breast mass  HPI: [name] is a 74 year old female with a past medical history significant for paroxysmal atrial fibrillation on Eliquis, combined systolic and diastolic CHF, ventricular tachycardia status post ICD placement in 2013, CAD status post CABG, hypertension, hyperlipidemia, hypertrophic cardiomyopathy.  She presented to the emergency room with worsening midsternal chest pain and upper back pain x1 month.  Labs on admission showed a hemoglobin of 11.2, glucose 187, creatinine 1.06, BNP 581.1.  CTA of the chest showed no evidence for thoracic aortic dissection, acute appearing compression fractures of the T4 and T5 vertebral bodies, questional fracture of the T3 vertebral body, acute fracture of the C7 transverse process, 4.1 x 2.4 cm left-sided breast mass, cardiomegaly with small right sided pleural effusion.  IR has been consulted for possible kyphoplasty.  They have recommended a bone scan.  Cardiology has been consulted due to her underlying CHF and is planning for cardioversion tomorrow.  Of note, the patient will need to remain on Eliquis uninterrupted for at least 1 month following cardioversion.  The patient reports that her back pain is better with her current pain medications.  Pain is primarily in the upper back above her bra line.  She was also having some chest discomfort on admission that radiated around to her back.  States that her back and chest pain were present for a few weeks.  She was using tramadol and Tylenol at home.  She reports a poor appetite and a weight loss of approximately 20 pounds.  She had some recent nausea and vomiting as well.  Reports that she noticed her breast mass at least a few months ago.  She initially thought this was scar tissue due to her prior cardiac surgery.  She states this  area has been growing over the past few months.  Reports that she has not had mammograms for many years.  Denies headaches and dizziness.  She is not currently having any shortness of breath.  Denies abdominal pain, constipation, diarrhea.  Denies lower extremity edema.  Denies bleeding such as epistaxis, hemoptysis, hematemesis, hematuria, melena, hematochezia.  The patient is divorced and has 1 daughter who she lives with.  Denies history of alcohol use.  Smoked a minimal amount for about 4 years but quit in 1982.  Reports a family history of breast cancer in her mother who was diagnosed in her 31s.  Reports that her sister and niece have had benign breast lesions and have had lumpectomies because of this.  Reports a maternal uncle with gastric cancer.  Medical oncology was asked see the patient for recommendations regarding her left breast mass.   Past Medical History:  Diagnosis Date  . Abnormal PFT   . CAD (coronary artery disease), native coronary artery - 3 vessel 04/21/2015   a. NSTEMI 8-04/2015 felt 2/2 demand ischemia. b. 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  . Chronic diastolic CHF (congestive heart failure) ([place])   . Diabetes mellitus ([place])   . Hypertrophic cardiomyopathy ([place])   . Left atrial enlargement   . Mitral regurgitation    a. Echo 04/2015: moderate mitral regurgitation. b. F/u echo 05/2015: mild MR.  . NSTEMI (non-ST elevated myocardial infarction) ([place]) 11/27/2016  . Paroxysmal atrial fibrillation ([name])   . Tricuspid regurgitation    a. Echo 04/2015: Mod-severe  TR. b. Not mentioned on echo 05/2015  . Ventricular tachycardia ([place])    a. s/p St. Jude ICD implanted 04/2015.  :  Past Surgical History:  Procedure Laterality Date  . APPLICATION OF WOUND VAC Left 01/06/2013   Procedure: APPLICATION OF WOUND VAC X 2;  Surgeon: [name], MD;  Location: [facility];  Service: Orthopedics;  Laterality: Left;  left forearm  .  APPLICATION OF WOUND VAC Left 01/09/2013   Procedure: APPLICATION OF WOUND VAC;  Surgeon: [name], MD;  [facility]: [facility];  Service: Orthopedics;  Laterality: Left;  . CARDIAC CATHETERIZATION N/A 04/19/2015   Procedure: Left Heart Cath and Coronary Angiography;  Surgeon: [name], MD;  Location: [place] [facility];  Service: Cardiovascular;  Laterality: N/A;  . CARDIOVERSION N/A 09/20/2015   Procedure: CARDIOVERSION;  Surgeon: Pixie Casino, MD;  Location: [facility] ENDOSCOPY;  Service: Cardiovascular;  Laterality: N/A;  . CLIPPING OF ATRIAL APPENDAGE Left 12/04/2016   Procedure: CLIPPING OF ATRIAL APPENDAGE USING ATRICURE 45 ATRICLIP;  Surgeon: Melrose Nakayama, MD;  Location: [place];  Service: Open Heart Surgery;  Laterality: Left;  . CORONARY ARTERY BYPASS GRAFT N/A 12/04/2016   Procedure: CORONARY ARTERY BYPASS GRAFTING (CABG)x 4 WITH ENDOSCOPIC HARVESTING OF RIGHT SAPHENOUS VEIN;  Surgeon: Melrose Nakayama, MD;  Location: [place];  Service: Open Heart Surgery;  Laterality: N/A;  . DENTAL SURGERY  Oct. 2011   several extractions, bone graft  . EP IMPLANTABLE DEVICE N/A 04/16/2015   Procedure: ICD Implant;  Surgeon: [name], MD;  Location: [place] [facility];  Service: Cardiovascular;  Laterality: N/A;  . I & D EXTREMITY Left 01/06/2013   Procedure: IRRIGATION AND DEBRIDEMENT LEFT ELBOW AND LEFT FOREARM ;  Surgeon: [name], MD;  [facility];  Service: Orthopedics;  Laterality: Left;  . INCISION AND DRAINAGE Left 01/09/2013   Procedure: REDO INCISION AND DRAINAGE LEFT ELBOW;  Surgeon: [name], MD;  [facility];  Service: Orthopedics;  Laterality: Left;  SUPINE, UPPER EXTERMITY DRAPE  . INTRAOPERATIVE TRANSESOPHAGEAL ECHOCARDIOGRAM N/A 12/04/2016   Procedure: INTRAOPERATIVE TRANSESOPHAGEAL ECHOCARDIOGRAM;  Surgeon: Melrose Nakayama, MD;  Location: [place];  Service: Open Heart Surgery;  Laterality: N/A;  . IR THORACENTESIS ASP PLEURAL SPACE W/IMG GUIDE   12/22/2016  . LEFT HEART CATH AND CORONARY ANGIOGRAPHY N/A 11/28/2016   Procedure: Left Heart Cath and Coronary Angiography;  Surgeon: Belva Crome, MD;  Location: [place] [facility];  Service: Cardiovascular;  Laterality: N/A;  . MAZE N/A 12/04/2016   Procedure: MAZE PROCEDURE;  Surgeon: Melrose Nakayama, MD;  Location: [place];  Service: Open Heart Surgery;  Laterality: N/A;  . RIGHT HEART CATH N/A 01/02/2017   Procedure: Right Heart Cath;  Surgeon: Jolaine Artist, MD;  Location: [place] [facility];  Service: Cardiovascular;  Laterality: N/A;  . SECONDARY CLOSURE OF WOUND Left 01/09/2013   Procedure: SECONDARY CLOSURE OF WOUND  LEFT ELBOW;  Surgeon: [name], MD;  [facility];  Service: Orthopedics;  Laterality: Left;  . TEE WITHOUT CARDIOVERSION N/A 09/20/2015   Procedure: TRANSESOPHAGEAL ECHOCARDIOGRAM (TEE);  Surgeon: Pixie Casino, MD;  [facility]COPY;  Service: Cardiovascular;  Laterality: N/A;  :  Current Facility-Administered Medications  Medication Dose Route Frequency Provider Last Rate Last Admin  . acetaminophen (TYLENOL) tablet 650 mg  650 mg Oral Q6H PRN Pahwani, Rinka R, MD   650 mg at 12/05/19 0909   Or  .  acetaminophen (TYLENOL) suppository 650 mg  650 mg Rectal Q6H PRN Pahwani, Rinka R, MD      . apixaban (ELIQUIS) tablet 5 mg  5 mg Oral BID Pahwani, Rinka R, MD   5 mg at 12/05/19 0901  . furosemide (LASIX) injection 60 mg  60 mg Intravenous BID Simmons, Brittainy M, PA-C      . insulin aspart (novoLOG) injection 0-15 Units  0-15 Units Subcutaneous TID WC Pahwani, Rinka R, MD   2 Units at 12/05/19 1231  . insulin aspart (novoLOG) injection 0-5 Units  0-5 Units Subcutaneous QHS Pahwani, Rinka R, MD      . metoprolol succinate (TOPROL-XL) 24 hr tablet 25 mg  25 mg Oral Daily Simmons, Brittainy M, PA-C      . morphine 2 MG/ML injection 2 mg  2 mg Intravenous Q2H PRN Pahwani, Rinka R, MD   2 mg at 12/05/19 0540  . oxyCODONE (Oxy IR/ROXICODONE) immediate  release tablet 5 mg  5 mg Oral Q4H PRN Damita Lack, MD   5 mg at 12/05/19 0909  . polyethylene glycol (MIRALAX / GLYCOLAX) packet 17 g  17 g Oral Daily PRN Amin, Ankit Chirag, MD      . senna-docusate (Senokot-S) tablet 2 tablet  2 tablet Oral QHS PRN Amin, Jeanella Flattery, MD      . Vitamin D (Ergocalciferol) (DRISDOL) capsule 50,000 Units  50,000 Units Oral Q7 days Amin, Jeanella Flattery, MD         Allergies  Allergen Reactions  . Levaquin [Levofloxacin] Other (See Comments)    Suicidal thoughts, extreme anxiety, panic attack  . Phenergan [Promethazine Hcl] Other (See Comments)    Restless  :  Family History  Problem Relation Age of Onset  . Breast cancer Mother   . Diabetes Father   . Stroke Father   . Heart disease Sister   . Healthy Sister   :  Social History   Socioeconomic History  . Marital status: Divorced    Spouse name: Not on file  . Number of children: Not on file  . Years of education: Not on file  . Highest education level: Not on file  Occupational History  . Not on file  Tobacco Use  . Smoking status: Former Smoker    Years: 4.00    Quit date: 12/31/1980    Years since quitting: 38.9  . Smokeless tobacco: Never Used  Substance and Sexual Activity  . Alcohol use: No  . Drug use: No  . Sexual activity: Not Currently  Other Topics Concern  . Not on file  Social History Narrative   Divorced.  Lives with daughter and grandson. 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   Social Determinants of Health   Financial Resource Strain:   . Difficulty of Paying Living Expenses:   Food Insecurity:   . Worried About Charity fundraiser in the Last Year:   . Arboriculturist in the Last Year:   Transportation Needs:   . Film/video editor (Medical):   [name] Lack of Transportation (Non-Medical):   Physical Activity:   . Days of Exercise per Week:   . Minutes of Exercise per Session:   Stress:   . Feeling of Stress :   Social Connections:   . Frequency  of Communication with Friends and Family:   . Frequency of Social Gatherings with Friends and Family:   . Attends Religious Services:   . Active Member of Clubs or  Organizations:   . Attends Archivist Meetings:   [name] Marital Status:   Intimate Partner Violence:   . Fear of Current or Ex-Partner:   . Emotionally Abused:   [name] Physically Abused:   . Sexually Abused:   :  Review of Systems: A comprehensive 14 point review of systems was negative except as noted in the HPI.  Exam: Patient Vitals for the past 24 hrs:  BP Temp Temp src Pulse Resp SpO2 Weight  12/05/19 1100 134/84 97.7 F (36.5 C) Oral (!) 112 (!) 24 94 % --  12/05/19 0806 (!) 138/94 (!) 97.5 F (36.4 C) Oral 69 -- 93 % --  12/05/19 0447 (!) 147/79 97.6 F (36.4 C) Oral 96 20 94 % 79.6 kg  12/05/19 0040 133/89 98.1 F (36.7 C) Oral 97 20 97 % --  12/04/19 1953 133/89 97.8 F (36.6 C) Oral 93 20 96 % --  12/04/19 1800 129/89 -- -- 83 -- 98 % --  12/04/19 1715 (!) 130/105 -- -- 90 18 98 % --  12/04/19 1703 (!) 140/103 -- -- 84 (!) 25 98 % --  12/04/19 1600 132/90 -- -- 84 (!) 23 98 % --  12/04/19 1545 (!) 118/102 -- -- 89 (!) 21 96 % --  12/04/19 1500 (!) 139/94 -- -- 90 (!) 23 90 % --  12/04/19 1445 132/87 -- -- 92 (!) 26 (!) 86 % --  12/04/19 1430 135/82 -- -- (!) 103 (!) 27 (!) 89 % --  12/04/19 1400 (!) 131/93 -- -- (!) 102 (!) 24 96 % --  12/04/19 1345 (!) 130/97 -- -- (!) 101 (!) 24 95 % --  12/04/19 1315 131/87 -- -- (!) 102 (!) 32 95 % --    General:  well-nourished in no acute distress.   Eyes:  no scleral icterus.   ENT:  There were no oropharyngeal lesions.   Neck was without thyromegaly.   Lymphatics:  Negative cervical, supraclavicular or axillary adenopathy.   Breasts: No palpable masses in the right breast.  Left breast with visualized mass in the upper breast.  Mass is palpable and measures approximately 3 x 4 cm. Respiratory: Bibasilar rales Cardiovascular: Irregularly irregular, no  lower extremity edema GI:  abdomen was soft, flat, nontender, nondistended, without organomegaly.   Musculoskeletal: Tenderness over the thoracic spine with palpation.  Skin exam was without echymosis, petichae.   Neuro exam was nonfocal. Patient was alert and oriented.  Attention was good.   Language was appropriate.  Mood was normal without depression.  Speech was not pressured.  Thought content was not tangential.     Lab Results  Component Value Date   WBC 8.3 12/05/2019   HGB 11.1 (L) 12/05/2019   HCT 37.1 12/05/2019   PLT 328 12/05/2019   GLUCOSE 134 (H) 12/05/2019   CHOL 104 12/05/2019   TRIG 90 12/05/2019   HDL 25 (L) 12/05/2019   LDLCALC 61 12/05/2019   ALT 28 12/05/2019   AST 33 12/05/2019   NA 142 12/05/2019   K 3.8 12/05/2019   CL 108 12/05/2019   CREATININE 0.98 12/05/2019   BUN 18 12/05/2019   CO2 20 (L) 12/05/2019    DG Chest Port 1 View  Result Date: 12/04/2019 CLINICAL DATA:  Right-sided chest pain. EXAM: PORTABLE CHEST 1 VIEW COMPARISON:  02/10/2017 FINDINGS: Stable heart size and appearance of pacing/ICD device and left atrial appendage clip. Status post prior CABG. Lungs demonstrate evidence pulmonary interstitial edema. No focal airspace  consolidation, pleural fluid or pneumothorax identified. IMPRESSION: Pulmonary interstitial edema. Electronically Signed   By: Aletta Edouard M.D.   On: 12/04/2019 14:17   CT ANGIO CHEST AORTA W/CM &/OR WO/CM  Result Date: 12/04/2019 CLINICAL DATA:  Chest pain and back pain. Aortic dissection suspected. EXAM: CT ANGIOGRAPHY CHEST WITH CONTRAST TECHNIQUE: Multidetector CT imaging of the chest was performed using the standard protocol during bolus administration of intravenous contrast. Multiplanar CT image reconstructions and MIPs were obtained to evaluate the vascular anatomy. CONTRAST:  114mL OMNIPAQUE IOHEXOL 350 MG/ML SOLN COMPARISON:  None. FINDINGS: Cardiovascular: There is no evidence for a thoracic aortic dissection. There  is no evidence for thoracic aortic aneurysm. Atherosclerotic changes are noted of the thoracic aorta. The main pulmonary artery is slightly dilated. There is no large centrally located pulmonary embolism. Detection of smaller pulmonary emboli is limited by technique. The heart is enlarged. There is no significant pericardial effusion. The patient is status post prior CABG. Coronary artery calcifications are noted. 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 There is reflux of contrast in the IVC consistent with underlying cardiac dysfunction. Mediastinum/Nodes: --mildly prominent mediastinal and hilar lymph nodes are noted. For example there is a precarinal lymph node measuring approximately 1.4 cm. These are presumably reactive. --No axillary lymphadenopathy. --No supraclavicular lymphadenopathy. --Normal thyroid gland. --The esophagus is unremarkable Lungs/Pleura: There is a small right-sided pleural effusion. There is some mild bilateral interlobular septal thickening. There is a probable trace left-sided pleural effusion. There is no pneumothorax. Upper Abdomen: There may be cholelithiasis within the partially visualized gallbladder. Musculoskeletal: There is a questionable 4.1 x 2.4 cm left-sided breast mass (axial series 5, image 44). There are acute appearing compression fractures of the T4 and T5 vertebral bodies. There is a questionable fracture of the T3 vertebral body. There is an acute fracture of the C7 transverse process. There is a questionable fracture of the superior endplate of the C7 vertebral body. Review of the MIP images confirms the above findings. IMPRESSION: 1. There is no evidence for a thoracic aortic dissection. 2. There are acute appearing compression fractures of the T4 and T5 vertebral bodies. There is a questionable fracture of  the T3 vertebral body. There is an acute fracture of the C7 transverse process. There is a questionable fracture through the superior endplate of the C7 vertebral body. 3. There is a questionable 4.1 x 2.4 cm left-sided breast mass. Correlation with physical exam and mammography is recommended. 4. Cardiomegaly with small right-sided pleural effusion and mild bilateral interlobular septal thickening, suggestive of interstitial edema. 5. Reflux of contrast in the IVC consistent with underlying cardiac dysfunction. 6. 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 7. Possible cholelithiasis within the partially visualized gallbladder. Aortic Atherosclerosis (ICD10-I70.0). Electronically Signed   By: Constance Holster M.D.   On: 12/04/2019 21:31     DG Chest Port 1 View  Result Date: 12/04/2019 CLINICAL DATA:  Right-sided chest pain. EXAM: PORTABLE CHEST 1 VIEW COMPARISON:  02/10/2017 FINDINGS: Stable heart size and appearance of pacing/ICD device and left atrial appendage clip. Status post prior CABG. Lungs demonstrate evidence pulmonary interstitial edema. No focal airspace consolidation, pleural fluid or pneumothorax identified. IMPRESSION: Pulmonary interstitial edema. Electronically Signed   By: Eulas Post  Kathlene Cote M.D.   On: 12/04/2019 14:17   CT ANGIO CHEST AORTA W/CM &/OR WO/CM  Result Date: 12/04/2019 CLINICAL DATA:  Chest pain and back pain. Aortic dissection suspected. EXAM: CT ANGIOGRAPHY CHEST WITH CONTRAST TECHNIQUE: Multidetector CT imaging of the chest was performed using the standard protocol during bolus administration of intravenous contrast. Multiplanar CT image reconstructions and MIPs were obtained to evaluate the vascular anatomy. CONTRAST:  120mL OMNIPAQUE IOHEXOL 350 MG/ML SOLN COMPARISON:  None. FINDINGS: Cardiovascular: There is no  evidence for a thoracic aortic dissection. There is no evidence for thoracic aortic aneurysm. Atherosclerotic changes are noted of the thoracic aorta. The main pulmonary artery is slightly dilated. There is no large centrally located pulmonary embolism. Detection of smaller pulmonary emboli is limited by technique. The heart is enlarged. There is no significant pericardial effusion. The patient is status post prior CABG. Coronary artery calcifications are noted. 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 There is reflux of contrast in the IVC consistent with underlying cardiac dysfunction. Mediastinum/Nodes: --mildly prominent mediastinal and hilar lymph nodes are noted. For example there is a precarinal lymph node measuring approximately 1.4 cm. These are presumably reactive. --No axillary lymphadenopathy. --No supraclavicular lymphadenopathy. --Normal thyroid gland. --The esophagus is unremarkable Lungs/Pleura: There is a small right-sided pleural effusion. There is some mild bilateral interlobular septal thickening. There is a probable trace left-sided pleural effusion. There is no pneumothorax. Upper Abdomen: There may be cholelithiasis within the partially visualized gallbladder. Musculoskeletal: There is a questionable 4.1 x 2.4 cm left-sided breast mass (axial series 5, image 44). There are acute appearing compression fractures of the T4 and T5 vertebral bodies. There is a questionable fracture of the T3 vertebral body. There is an acute fracture of the C7 transverse process. There is a questionable fracture of the superior endplate of the C7 vertebral body. Review of the MIP images confirms the above findings. IMPRESSION: 1. There is no evidence for a thoracic aortic dissection. 2. There are acute appearing compression fractures of the T4 and T5  vertebral bodies. There is a questionable fracture of the T3 vertebral body. There is an acute fracture of the C7 transverse process. There is a questionable fracture through the superior endplate of the C7 vertebral body. 3. There is a questionable 4.1 x 2.4 cm left-sided breast mass. Correlation with physical exam and mammography is recommended. 4. Cardiomegaly with small right-sided pleural effusion and mild bilateral interlobular septal thickening, suggestive of interstitial edema. 5. Reflux of contrast in the IVC consistent with underlying cardiac dysfunction. 6. 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 7. Possible cholelithiasis within the partially visualized gallbladder. Aortic Atherosclerosis (ICD10-I70.0). Electronically Signed   By: Constance Holster M.D.   On: 12/04/2019 21:31   Assessment and Plan:  1.  Left breast mass 2.  Multiple compression fractures 3.  Anemia 4.  Acute congestive heart failure 5.  CAD status post CABG 6.  Hypertrophic cardiomyopathy 7.  Atrial fibrillation with RVR 8.  History of ventricular tachycardia as post ICD placement 9.  Diabetes mellitus 10.  History of anxiety and depression  -We discussed the left breast masses concerning for breast cancer.  Left breast mass needs additional work-up including a mammogram, breast ultrasound, and biopsy.  However, given her inpatient status, this work-up is challenging.  It is recommended for her to have this work-up as an outpatient per radiology.  This is going to be challenging for the patient given her need for uninterrupted anticoagulation after cardioversion for 1 month and lack of transportation. -We will obtain a CA 27.29.  Also recommend a CT of the abdomen pelvis to evaluate for other sites to biopsy.  A bone scan has been recommended by IR.  Hopefully  between these test, we can find an alternate site other than the breast to biopsy.  I also wonder if we could reach out to general surgery for consideration of biopsy while she is inpatient. -Cardiology planning for cardioversion in the morning.  She will need uninterrupted Eliquis for at least 1 month following this procedure.  Thank you for this referral.   Mikey Bussing, DNP, AGPCNP-BC, AOCNP    ADDENDUM: I saw and examined Ms.Delsanto.  She is very nice.  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  It is unfortunate that she has never had a mammogram.  She thought this lump in the left breast was related to her cardiac surgery and pacemaker/ICD that was in.  She has not noted any nipple discharge.  There is no swelling in the left breast.  She has had no pain with this lump.  She is not noted any left arm swelling.  Given the "politics" of breast cancer in the [facility] system, I realize that trying to get a biopsy as an inpatient is going to be tough.  However, it will be somewhat easier for her if we can get a biopsy.  She is on Eliquis.  I will get her off Eliquis and have her on heparin.  We can keep her on heparin until she gets a biopsy.  She has had a CAT scan done.  There is no obvious evidence of metastatic disease although she has these compression fractures in her spine.  I am not sure of these are pathologic.  A bone scan might help [place].  I will send off a CA 27.29 level.  She cannot have an MRI because of the pacemaker.  At least, we should try to get a biopsy of this mass.  At least if we get a biopsy of the mass as an inpatient, we can start getting to work on what this is if it is breast cancer, and no the markers.  I find it interesting that her mother died of breast cancer at age 12 years old.  I realize she has a cardiac issues.  She is going to have a cardioversion tomorrow.  I would have to believe that the pleural effusion that she has  is secondary to cardiac issues and not cancer.  She did smoke in the past.  It has been a long time since she has stopped smoking.  She says smoked only couple years in the 47s.  Again, we will try our best to get a biopsy while she is in the hospital.  I will have her on heparin.  This can be stopped for 1/2-hour or so to have the biopsy done.  We will see what the bone scan shows.  Again a CT scan that was done did not show anything that was obvious with respect to abdominal/hepatic disease.  She did have some mediastinal nodes that were somewhat prominent.  One that was looked at measuring 1.4 cm.  Outside  of that, no other adenopathy was noted in the chest.  Ms.Butson wanted me to pray with her.  I definitely did this.  She does have a strong faith.  She wants to try to have this breast lump dealt with as quickly as possible.   Kayla Haw, MD  Darlyn Chamber 17:14

## 2019-12-05 NOTE — Progress Notes (Signed)
Discussed w/ Dr. Haroldine Laws. We will cancel DCCV on 5/4 to allow for breast biopsy. RN/patient notified of plan. Will cancel NPO at midnight order. Resume heart healthy diet.   [name] [employer]

## 2019-12-05 NOTE — Plan of Care (Signed)
  Problem: Clinical Measurements: Goal: Ability to maintain clinical measurements within normal limits will improve Outcome: Progressing   Problem: Pain Managment: Goal: General experience of comfort will improve Outcome: Progressing   

## 2019-12-05 NOTE — Progress Notes (Signed)
12/05/2019 PM Addendum DCCV is canceled plan to proceed with biopsy > stop apixaban and begin heparin bridge tonight  Heparin drip 1000 uts/hr  Daily HL, CBC Bonnita Nasuti Pharm.D. CPP, BCPS Clinical Pharmacist [phone] 12/05/2019 5:52 PM       ADDENDUM: Consult was canceled and Eliquis resumed.  Heparin was not initiated at this time.  Horton Chin, PharmD.   ANTICOAGULATION CONSULT NOTE - Initial Consult  Pharmacy Consult for Heparin Indication: atrial fibrillation  Allergies  Allergen Reactions  . Levaquin [Levofloxacin] Other (See Comments)    Suicidal thoughts, extreme anxiety, panic attack  . Phenergan [Promethazine Hcl] Other (See Comments)    Restless    Patient Measurements: Height: 5\' 4"  (162.6 cm) Weight: 79.6 kg (175 lb 8 oz) IBW/kg (Calculated) : 54.7 Heparin Dosing Weight: 70.9 kg  Vital Signs: Temp: 98 F (36.7 C) (05/03 1703) Temp Source: Oral (05/03 1703) BP: 133/53 (05/03 1703) Pulse Rate: 92 (05/03 1703)  Labs: Recent Labs    12/04/19 1324 12/04/19 1637 12/05/19 0442  HGB 11.2*  --  11.1*  HCT 37.6  --  37.1  PLT 310  --  328  CREATININE 1.06*  --  0.98  TROPONINIHS 15 16  --     Estimated Creatinine Clearance: 53.8 mL/min (by C-G formula based on SCr of 0.98 mg/dL).   Medical History: Past Medical History:  Diagnosis Date  . Abnormal PFT   . CAD (coronary artery disease), native coronary artery - 3 vessel 04/21/2015   a. NSTEMI 8-04/2015 felt 2/2 demand ischemia. b. 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  . Chronic diastolic CHF (congestive heart failure) ([place])   . Diabetes mellitus ([place])   . Hypertrophic cardiomyopathy ([place])   . Left atrial enlargement   . Mitral regurgitation    a. Echo 04/2015: moderate mitral regurgitation. b. F/u echo 05/2015: mild MR.  . NSTEMI (non-ST elevated myocardial infarction) ([facility]) 11/27/2016  . Paroxysmal atrial fibrillation ([name])   .  Tricuspid regurgitation    a. Echo 04/2015: Mod-severe TR. b. Not mentioned on echo 05/2015  . Ventricular tachycardia ([place])    a. s/p [employer] ICD implanted 04/2015.    Medications:  Scheduled:  . furosemide  60 mg Intravenous BID  . insulin aspart  0-15 Units Subcutaneous TID WC  . insulin aspart  0-5 Units Subcutaneous QHS  . metoprolol succinate  25 mg Oral Daily  . Vitamin D (Ergocalciferol)  50,000 Units Oral Q7 days   Infusions:    Assessment: 74 yo F with new L breast mass.  Pt on Eliquis PTA for pAF.  Plans to hold Eliquis and bridge with heparin infusion given need for breast biopsy.  Last dose of Eliquis was 5/3 at 0900.  Will start heparin infusion at 9pm tonight.  Eliquis can interfere with heparin level monitoring.  Will need to utilize aPTTs for heparin monitoring until Eliquis is cleared from the system.  Will check baseline heparin level and aPTT.  Goal of Therapy:  Heparin level 0.3-0.7 units/ml aPTT 66-102 seconds Monitor platelets by anticoagulation protocol: Yes   Plan:  Baseline Heparin level and aPTT. Start heparin at 1000 units/hr at 9pm tonight. Check aPTT at 0500 5/4 (8 hours after infusion started). Heparin level, CBC, and aPTT daily while on heparin.  [employer], Pharm.D., BCPS Clinical Pharmacist Clinical phone for 12/05/2019 is [phone].  **Pharmacist phone directory can be found on [email or web address] listed under [place].  12/05/2019 5:50  PM   

## 2019-12-05 NOTE — Progress Notes (Signed)
PROGRESS NOTE    REATA WESTRICK  [id number] DOB: 13-Apr-1946 DOA: 12/04/2019 PCP: Maurice Small, MD   Brief Narrative:  74 year old with history of paroxysmal A. fib on Eliquis, systolic CHF status post CABG, ventricular tachycardia status post ICD placement 1113, HTN, hypertrophic cardiomyopathy, HLD who presented with chest pain and back pain ongoing for 1 month.  In ED noted to be in atrial fibrillation with RVR, CHF exacerbation.  Cardiology and hospitalist team consulted for admission and further care.   Assessment & Plan:   Principal Problem:   Chest pain Active Problems:   Diabetes mellitus type 2, noninsulin dependent ([name])   Systolic CHF ([name])   CAD (coronary artery disease)   HLD (hyperlipidemia)   Paroxysmal atrial fibrillation ([name])   CKD (chronic kidney disease) stage 3, GFR 30-59 ml/min   S/P CABG x 5   HTN (hypertension)  Atypical chest pain and upper back pain -Cardiac enzymes remain negative.  No dissection seen on the CT area of the chest -CT of the chest did show thoracic compression fractures, C7 acute fracture.  Questionable left breast mass, cardiomegaly with pleural effusion interstitial edema  Compression fracture of thoracic vertebrae and acute C7 transverse process -We will consult IR to see if patient is candidate for kyphoplasty. -Fractures pathologic given underlying breast mass with mets? -Bone scan ordered.  Acute congestive heart failure with reduced ejection fraction, 45%, class III Hypertrophic cardiomyopathy CAD status post CABG -Getting Lasix 40 mg IV twice daily -Strict input and output, daily weight -Cardiology team consulted  Left-sided breast mass, upper quadrant -Difficult to obtain inpatient work-up.  Positive family history and young age in her mother. -I am notified oncology team to help facilitate appropriate work-up.  Vitamin D deficiency -Vitamin D supplements 50,000 units every 7 days.  Atrial fibrillation with RVR, rate  controlled -Continue Eliquis.  Defer rest of management per cardiology  History of ventricular tachycardia status post ICD placement -Monitor closely  Diabetes mellitus type 2 -Sliding scale and Accu-Chek, hemoglobin A1c 7.7  Hyperlipidemia -Continue statin, LDL at goal-61  CKD stage II -Creatinine around baseline of 0.9.  Continue to monitor.  History of anxiety/depression -Resume home meds  Unintentional weight loss -TSH- Neg -HIV- pending  DVT prophylaxis: Elquis Code Status: Full code Family Communication: None  Status is: Inpatient  Remains inpatient appropriate because:Ongoing diagnostic testing needed not appropriate for outpatient work up   Dispo: The patient is from: Home              Anticipated d/c is to: Home              Anticipated d/c date is: 2 days              Patient currently undergoing work-up for breast mass and multiple compression fracture complicated by A. fib with RVR and CHF exacerbation.  Unsafe for discharge at the moment.  Multiple ongoing issues.  Subjective: Patient tells me that she has felt left upper quadrant breast mass for several months and has had 20 pound of unintentional weight loss.  She was afraid to get diagnosis of this therefore did not seek medical attention.  Mother passed away due to metastatic breast cancer in her 54s.  She is very tearful this morning given her medical issues.  I offered her help and she is appreciative of her care.  Review of Systems Otherwise negative except as per HPI, including: General: Denies fever, chills, night sweats or unintended weight loss. Resp: Denies cough,  wheezing, shortness of breath. Cardiac: Deniespalpitations, orthopnea, paroxysmal nocturnal dyspnea. GI: Denies abdominal pain, nausea, vomiting, diarrhea or constipation GU: Denies dysuria, frequency, hesitancy or incontinence MS: Denies muscle aches, joint pain or swelling Neuro: Denies headache, neurologic deficits (focal  weakness, numbness, tingling), abnormal gait Psych: Denies anxiety, depression, SI/HI/AVH Skin: Denies new rashes or lesions ID: Denies sick contacts, exotic exposures, travel  Examination:  Constitutional: Tearful Respiratory: Bibasilar crackles Cardiovascular: Irregularly irregular Abdomen: Nontender nondistended good bowel sounds Musculoskeletal: No edema noted, tender to palpation midline Skin: No rashes seen Neurologic: CN 2-12 grossly intact.  And nonfocal Psychiatric: Normal judgment and insight. Alert and oriented x 3. Normal mood.  Breast examination performed with chaperone, RN Saddie Benders in the room.  Patient was noted to have firm left upper quadrant mass, rest of the left and right breast seems to be okay..   Objective: Vitals:   12/04/19 1800 12/04/19 1953 12/05/19 0040 12/05/19 0447  BP: 129/89 133/89 133/89 (!) 147/79  Pulse: 83 93 97 96  Resp:  20 20 20   Temp:  97.8 F (36.6 C) 98.1 F (36.7 C) 97.6 F (36.4 C)  TempSrc:  Oral Oral Oral  SpO2: 98% 96% 97% 94%  Weight:    79.6 kg  Height:        Intake/Output Summary (Last 24 hours) at 12/05/2019 0804 Last data filed at 12/05/2019 0600 Gross per 24 hour  Intake 480 ml  Output 1700 ml  Net -1220 ml   Filed Weights   12/04/19 1245 12/05/19 0447  Weight: 76.7 kg 79.6 kg     Data Reviewed:   CBC: Recent Labs  Lab 12/04/19 1324 12/05/19 0442  WBC 8.6 8.3  HGB 11.2* 11.1*  HCT 37.6 37.1  MCV 88.7 88.5  PLT 310 [id number]   Basic Metabolic Panel: Recent Labs  Lab 12/04/19 1324 12/04/19 1637 12/05/19 0442  NA 138  --  142  K 3.5  --  3.8  CL 106  --  108  CO2 20*  --  20*  GLUCOSE 187*  --  134*  BUN 20  --  18  CREATININE 1.06*  --  0.98  CALCIUM 9.0  --  9.1  MG  --  1.7  --   PHOS  --  4.4  --    GFR: Estimated Creatinine Clearance: 53.8 mL/min (by C-G formula based on SCr of 0.98 mg/dL). Liver Function Tests: Recent Labs  Lab 12/05/19 0442  AST 33  ALT 28  ALKPHOS 103    BILITOT 1.2  PROT 6.7  ALBUMIN 3.3*   No results for input(s): LIPASE, AMYLASE in the last 168 hours. No results for input(s): AMMONIA in the last 168 hours. Coagulation Profile: No results for input(s): INR, PROTIME in the last 168 hours. Cardiac Enzymes: No results for input(s): CKTOTAL, CKMB, CKMBINDEX, TROPONINI in the last 168 hours. BNP (last 3 results) No results for input(s): PROBNP in the last 8760 hours. HbA1C: Recent Labs    12/04/19 1637  HGBA1C 7.7*   CBG: Recent Labs  Lab 12/04/19 1246 12/04/19 1636 12/04/19 2315 12/05/19 0802  GLUCAP 178* 166* 172* 150*   Lipid Profile: Recent Labs    12/04/19 1637 12/05/19 0442  CHOL 105 104  HDL 27* 25*  LDLCALC 59 61  TRIG 93 90  CHOLHDL 3.9 4.2   Thyroid Function Tests: Recent Labs    12/04/19 1637  TSH 3.815   Anemia Panel: No results for input(s): VITAMINB12, FOLATE, FERRITIN, TIBC, IRON, RETICCTPCT  in the last 72 hours. Sepsis Labs: No results for input(s): PROCALCITON, LATICACIDVEN in the last 168 hours.  Recent Results (from the past 240 hour(s))  Respiratory Panel by RT PCR (Flu A&B, Covid) - Nasopharyngeal Swab     Status: None   Collection Time: 12/04/19  2:06 PM   Specimen: Nasopharyngeal Swab  Result Value Ref Range Status   SARS Coronavirus 2 by RT PCR NEGATIVE NEGATIVE Final    Comment: (NOTE) SARS-CoV-2 target nucleic acids are NOT DETECTED. The SARS-CoV-2 RNA is generally detectable in upper respiratoy specimens during the acute phase of infection. The lowest concentration of SARS-CoV-2 viral copies this assay can detect is 131 copies/mL. A negative result does not preclude SARS-Cov-2 infection and should not be used as the sole basis for treatment or other patient management decisions. A negative result may occur with  improper specimen collection/handling, submission of specimen other than nasopharyngeal swab, presence of viral mutation(s) within the areas targeted by this assay,  and inadequate number of viral copies (<131 copies/mL). A negative result must be combined with clinical observations, patient history, and epidemiological information. The expected result is Negative. Fact Sheet for Patients:  PinkCheek.be Fact Sheet for Healthcare Providers:  GravelBags.it This test is not yet ap proved or cleared by the [place] FDA and  has been authorized for detection and/or diagnosis of SARS-CoV-2 by FDA under an Emergency Use Authorization (EUA). This EUA will remain  in effect (meaning this test can be used) for the duration of the COVID-19 declaration under Section 564(b)(1) of the Act, 21 U.S.C. section 360bbb-3(b)(1), unless the authorization is terminated or revoked sooner.    Influenza A by PCR NEGATIVE NEGATIVE Final   Influenza B by PCR NEGATIVE NEGATIVE Final    Comment: (NOTE) The Xpert Xpress SARS-CoV-2/FLU/RSV assay is intended as an aid in  the diagnosis of influenza from Nasopharyngeal swab specimens and  should not be used as a sole basis for treatment. Nasal washings and  aspirates are unacceptable for Xpert Xpress SARS-CoV-2/FLU/RSV  testing. Fact Sheet for Patients: PinkCheek.be Fact Sheet for Healthcare Providers: GravelBags.it This test is not yet approved or cleared by the [place] FDA and  has been authorized for detection and/or diagnosis of SARS-CoV-2 by  FDA under an Emergency Use Authorization (EUA). This EUA will remain  in effect (meaning this test can be used) for the duration of the  Covid-19 declaration under Section 564(b)(1) of the Act, 21  U.S.C. section 360bbb-3(b)(1), unless the authorization is  terminated or revoked. Performed at [facility], [place] [address] [place], [place] [address]          Radiology Studies: DG Chest Port 1 View  Result Date: 12/04/2019 CLINICAL DATA:   Right-sided chest pain. EXAM: PORTABLE CHEST 1 VIEW COMPARISON:  02/10/2017 FINDINGS: Stable heart size and appearance of pacing/ICD device and left atrial appendage clip. Status post prior CABG. Lungs demonstrate evidence pulmonary interstitial edema. No focal airspace consolidation, pleural fluid or pneumothorax identified. IMPRESSION: Pulmonary interstitial edema. Electronically Signed   By: Aletta Edouard M.D.   On: 12/04/2019 14:17   CT ANGIO CHEST AORTA W/CM &/OR WO/CM  Result Date: 12/04/2019 CLINICAL DATA:  Chest pain and back pain. Aortic dissection suspected. EXAM: CT ANGIOGRAPHY CHEST WITH CONTRAST TECHNIQUE: Multidetector CT imaging of the chest was performed using the standard protocol during bolus administration of intravenous contrast. Multiplanar CT image reconstructions and MIPs were obtained to evaluate the vascular anatomy. CONTRAST:  182mL OMNIPAQUE IOHEXOL 350  MG/ML SOLN COMPARISON:  None. FINDINGS: Cardiovascular: There is no evidence for a thoracic aortic dissection. There is no evidence for thoracic aortic aneurysm. Atherosclerotic changes are noted of the thoracic aorta. The main pulmonary artery is slightly dilated. There is no large centrally located pulmonary embolism. Detection of smaller pulmonary emboli is limited by technique. The heart is enlarged. There is no significant pericardial effusion. The patient is status post prior CABG. Coronary artery calcifications are noted. 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 There is reflux of contrast in the IVC consistent with underlying cardiac dysfunction. Mediastinum/Nodes: --mildly prominent mediastinal and hilar lymph nodes are noted. For example there is a precarinal lymph node measuring approximately 1.4 cm. These are presumably reactive. --No axillary lymphadenopathy.  --No supraclavicular lymphadenopathy. --Normal thyroid gland. --The esophagus is unremarkable Lungs/Pleura: There is a small right-sided pleural effusion. There is some mild bilateral interlobular septal thickening. There is a probable trace left-sided pleural effusion. There is no pneumothorax. Upper Abdomen: There may be cholelithiasis within the partially visualized gallbladder. Musculoskeletal: There is a questionable 4.1 x 2.4 cm left-sided breast mass (axial series 5, image 44). There are acute appearing compression fractures of the T4 and T5 vertebral bodies. There is a questionable fracture of the T3 vertebral body. There is an acute fracture of the C7 transverse process. There is a questionable fracture of the superior endplate of the C7 vertebral body. Review of the MIP images confirms the above findings. IMPRESSION: 1. There is no evidence for a thoracic aortic dissection. 2. There are acute appearing compression fractures of the T4 and T5 vertebral bodies. There is a questionable fracture of the T3 vertebral body. There is an acute fracture of the C7 transverse process. There is a questionable fracture through the superior endplate of the C7 vertebral body. 3. There is a questionable 4.1 x 2.4 cm left-sided breast mass. Correlation with physical exam and mammography is recommended. 4. Cardiomegaly with small right-sided pleural effusion and mild bilateral interlobular septal thickening, suggestive of interstitial edema. 5. Reflux of contrast in the IVC consistent with underlying cardiac dysfunction. 6. 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 7. Possible cholelithiasis within the partially visualized gallbladder. Aortic Atherosclerosis (ICD10-I70.0). Electronically Signed   By: Constance Holster M.D.   On: 12/04/2019 21:31         Scheduled Meds: . apixaban  5 mg Oral BID  . furosemide  40 mg Intravenous BID  . insulin aspart  0-15 Units Subcutaneous TID WC  . insulin aspart  0-5 Units Subcutaneous QHS   Continuous Infusions:   LOS: 1 day   Time spent= 35 mins    Danecia Underdown Arsenio Loader, MD [facility]  If 7PM-7AM, please contact night-coverage  12/05/2019, 8:04 AM

## 2019-12-05 NOTE — Evaluation (Signed)
Physical Therapy Evaluation Patient Details Name: [name] MRN: [id number] DOB: 1946/05/29 Today's Date: 12/05/2019   History of Present Illness  [name] is a 74 y.o. female with medical history significant of paroxysmal A. fib on Eliquis, combined systolic and diastolic CHF, ventricular tachycardia status post ICD placed in 2013, coronary artery disease status post CABG, hypertension, hyperlipidemia, hypertrophic cardiomyopathy presents to emergency department due to worsening midsternal chest pain and upper back pain since 1 month.  Clinical Impression  Patient received sitting up on side of bed eating lunch. Reluctant to participate in PT, but agrees. Reports significant pain in mid upper back rated 7/10 just sitting still. Requires min assist with sit to stand and min assist with ambulation of 20 feet using RW. Unsteady with ambulation and pain limited. She will continue to benefit from skilled PT while here to improve activity tolerance and ensure safety with mobility for return home at discharge.      Follow Up Recommendations Home health PT    Equipment Recommendations  None recommended by PT    Recommendations for Other Services       Precautions / Restrictions Precautions Precautions: Fall Restrictions Weight Bearing Restrictions: No      Mobility  Bed Mobility               General bed mobility comments: patient received sitting up on side of bed eating lunch  Transfers Overall transfer level: Needs assistance Equipment used: Rolling walker (2 wheeled) Transfers: Sit to/from Stand Sit to Stand: Min guard            Ambulation/Gait Ambulation/Gait assistance: Min guard Gait Distance (Feet): 20 Feet Assistive device: Rolling walker (2 wheeled) Gait Pattern/deviations: Step-through pattern;Decreased stride length Gait velocity: decr   General Gait Details: generally unsteady with mobility even using RW. Pain limited  Stairs             Wheelchair Mobility    Modified Rankin (Stroke Patients Only)       Balance Overall balance assessment: Needs assistance Sitting-balance support: Feet supported Sitting balance-Leahy Scale: Good     Standing balance support: Bilateral upper extremity supported;During functional activity Standing balance-Leahy Scale: Poor Standing balance comment: unsteady, requires RW and min assist                             Pertinent Vitals/Pain Pain Assessment: 0-10 Pain Score: 7  Pain Location: upper back Pain Descriptors / Indicators: Shooting;Discomfort Pain Intervention(s): Monitored during session    Home Living Family/patient expects to be discharged to:: Private residence Living Arrangements: Children Available Help at Discharge: Family;Available 24 hours/day Type of Home: House Home Access: Stairs to enter   [employer] of Steps: 1 Home Layout: Two level;Able to live on main level with bedroom/bathroom Home Equipment: Gilford Rile - 2 wheels;Cane - single point Additional Comments: patient reports she has been using walker since back pain started increasing    Prior Function Level of Independence: Independent with assistive device(s)         Comments: independent at home with ADLs     Hand Dominance        Extremity/Trunk Assessment   Upper Extremity Assessment Upper Extremity Assessment: Defer to OT evaluation    Lower Extremity Assessment Lower Extremity Assessment: Generalized weakness    Cervical / Trunk Assessment Cervical / Trunk Assessment: Normal  Communication   Communication: No difficulties  Cognition Arousal/Alertness: Awake/alert Behavior During Therapy: [facility]  for tasks assessed/performed Overall Cognitive Status: Within Functional Limits for tasks assessed                                        General Comments      Exercises     Assessment/Plan    PT Assessment Patient needs continued PT  services  PT Problem List Decreased strength;Decreased mobility;Decreased safety awareness;Decreased activity tolerance;Pain;Decreased balance       PT Treatment Interventions Therapeutic activities;Gait training;Therapeutic exercise;Functional mobility training;Stair training;Patient/family education    PT Goals (Current goals can be found in the Care Plan section)  Acute Rehab PT Goals Patient Stated Goal: to decrease pain, return home PT Goal Formulation: With patient Time For Goal Achievement: 12/19/19 Potential to Achieve Goals: Good    Frequency Min 3X/week   Barriers to discharge        Co-evaluation               AM-PAC PT "6 Clicks" Mobility  Outcome Measure Help needed turning from your back to your side while in a flat bed without using bedrails?: A Little Help needed moving from lying on your back to sitting on the side of a flat bed without using bedrails?: A Little Help needed moving to and from a bed to a chair (including a wheelchair)?: A Little Help needed standing up from a chair using your arms (e.g., wheelchair or bedside chair)?: A Little Help needed to walk in hospital room?: A Little Help needed climbing 3-5 steps with a railing? : A Lot 6 Click Score: 17    End of Session Equipment Utilized During Treatment: Gait belt Activity Tolerance: Patient tolerated treatment well;Patient limited by pain Patient left: in bed Nurse Communication: Mobility status PT Visit Diagnosis: Unsteadiness on feet (R26.81);Muscle weakness (generalized) (M62.81);Other abnormalities of gait and mobility (R26.89);Difficulty in walking, not elsewhere classified (R26.2);Pain Pain - part of body: (upper back)    Time: [id number] PT Time Calculation (min) (ACUTE ONLY): 24 min   Charges:   PT Evaluation $PT Eval Moderate Complexity: 1 Mod PT Treatments $Gait Training: 8-22 mins        Abbey Veith, PT, GCS 12/05/19,1:55 PM

## 2019-12-05 NOTE — Anesthesia Preprocedure Evaluation (Deleted)
Anesthesia Evaluation    Reviewed: Allergy & Precautions, Patient's Chart, lab work & pertinent test results  Airway        Dental   Pulmonary former smoker,           Cardiovascular hypertension, + CAD, + Past MI and + CABG  + dysrhythmias Atrial Fibrillation + Cardiac Defibrillator   IMPRESSIONS    1. Left ventricular ejection fraction, by visual estimation, is 45 to  50%. The left ventricle has low normal function. There is severely  increased left ventricular hypertrophy.  2. 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  3. Global right ventricle has normal systolic function.The right  ventricular size is normal. No increase in right ventricular wall  thickness.  4. AICD wires seen in RA/RV.  5. Left atrial size was moderately dilated.  6. Right atrial size was normal.  7. Moderate calcification of the mitral valve leaflet(s).  8. Moderate thickening of the mitral valve leaflet(s).  9. Severe mitral annular calcification.  10. The mitral valve is normal in structure. Trace mitral valve  regurgitation.  11. The tricuspid valve is normal in structure. Tricuspid valve  regurgitation is mild.  12. The aortic valve is tricuspid. Aortic valve regurgitation is not  visualized. Mild aortic valve sclerosis without stenosis.  13. The pulmonic valve was grossly normal. Pulmonic valve regurgitation is  mild.    Neuro/Psych negative neurological ROS  negative psych ROS   GI/Hepatic negative GI ROS, Neg liver ROS,   Endo/Other  diabetesHypothyroidism   Renal/GU negative Renal ROS     Musculoskeletal negative musculoskeletal ROS (+)   Abdominal   Peds  Hematology negative hematology ROS (+)    Anesthesia Other Findings Day of surgery medications reviewed with the patient.  Reproductive/Obstetrics                             Anesthesia Physical Anesthesia Plan  ASA: III  Anesthesia Plan: General   Post-op Pain Management:    Induction: Intravenous  PONV Risk Score and Plan: Propofol infusion and Treatment may vary due to age or medical condition  Airway Management Planned: Mask  Additional Equipment:   Intra-op Plan:   Post-operative Plan:   Informed Consent:   Plan Discussed with: Anesthesiologist  Anesthesia Plan Comments:         Anesthesia Quick Evaluation

## 2019-12-05 NOTE — Consult Note (Addendum)
Advanced Heart Failure Team Consult Note   Primary Physician: Maurice Small, MD PCP-Cardiologist:  Dr. Sallyanne Kuster Dr. Haroldine Laws   Reason for Consultation: acute on chronic combined systolic and diastolic CHF   HPI:    [name] is seen today for evaluation of acute on chronic combined systolic and diastolic CHF  at the request of Dr. Percival Spanish, general cardiology   [name] is a 74 y.o. 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 Jude ICD 04/2015, & chronic diastolic CHF (EF 0000000 by echo, 45-50% by cath).   Admitted 09/17/15 with new rapid aflutter (previously PAF/VT) with plans for cardioversion. She was also noted to be volume overload in setting of non-compliance (thought to be dietary). Had TEE/DC-CV with return to NSR. She developed laryngospasm and required brief intubation but recovered in PACU post procedure. Enrolled in the ReDS vest trial,  Reading 53%. She continued to diurese with IV lasix and transitioned to lasix 40 mg twice a day. On the day of discharge Reds Vest reading was 39. Discharge weight was 160 pounds.   Admitted 11/27/16-01/08/17 with VF shocked appropriately by ICD. Underwent cath that showed severe 3 vessel disease followed by CABG x 4, MAZE, and LAA clip. Post operatively developed AF with RVR and low output heart failure, initial co ox was 36%.Converted to NSR on amio. She was started on milrinone but unable to wean off successfully due to recurrent low output HF. Echo showed EF 45-50% with mild RV hypokinesis.  RHC performed on milrinone which showed primarily RV failure. Suspicion for amyloid raised. SPEP negative. Technetium pyrophosphate scan negative for TTR amyloid. Sent home on milrinone 0.375 mcg. Also had e. Coli UTI and treated with abx.  Home milrinone weaned off and PICC  pulled in 03/2017.   Last Echo 11/20 showed mildly reduced LVEF 45-50% w/ severe asymetric septal hypertrophy no SAM or LVOT gradient. RV systolic function normal.   In 12/20, she was taken off losartan given hyperkalemia (K 5.9). Since that time, she reports progressive exertional dyspnea.   Over the last 4 weeks, her dyspnea has worsened. Also w/ productive cough. Saw her PCP ~2 weeks ago and was felt to have an URI and was given a course of azithromycin w/o improvement. Also notes unintentional 20 lb wt loss in the last 4 weeks.  On 5/2, she presented to the ED for evaluation given worsening symptoms + upper back/ chest pain. In ED she was found to be in rapid afib, rates in the 110s.  CXR showed pulmonary interstitial edema. No focal airspace consolidation, pleural fluid or pneumothorax identified. BNP 581. COVID negative. HS troponin negative x 2, 15>>16.  TSH nl. SCr, K and WBC all WNL. She has chronic anemia. Hgb 11.1.   Chest CT negative for dissection but showed interstitial edema w/ small right sided pleural effusion. She was also found to have acute appearing, multilevel vertebral  compression fractures (C7 vertebral body/ transfer process, T4 and T5 vertebral bodies, ? T3 vertebral body). There is also  a questionable 4.1 x 2.4 cm left-sided breast mass. She has not had routine mammograms. Her mother had breast cancer, diagnosed in her 74s.   In the ED, she was given IV Cardizem for rate control and IV Lasix for diuresis. She remains in afib, rates in the 110s. Asymptomatic  at rest. -1.7L in UOP yesterday w/ IV Lasix. Exam notable for elevated JVD to ear. SCr stable at 0.98.    IR has been consulted for potential hypoplasty. IM to w/u left sided breast mass.      2D Echo 06/2019  1. Left ventricular ejection fraction, by visual estimation, is 45 to  50%. The left ventricle has low normal function. There is severely  increased left ventricular hypertrophy.  2. Severe asymetric  septal hypertrophy no SAM or LVOT gradient Findings  my be consistent with HOCM Abnormal GLS -10.7 with apical sparing  sometimes seen with amyloid   Distal septal and mid/basal inferior wall hypokinesis suggesting CAD  as well.  3. Global right ventricle has normal systolic function.The right  ventricular size is normal. No increase in right ventricular wall  thickness.  4. AICD wires seen in RA/RV.  5. Left atrial size was moderately dilated.  6. Right atrial size was normal.  7. Moderate calcification of the mitral valve leaflet(s).  8. Moderate thickening of the mitral valve leaflet(s).  9. Severe mitral annular calcification.  10. The mitral valve is normal in structure. Trace mitral valve  regurgitation.  11. The tricuspid valve is normal in structure. Tricuspid valve  regurgitation is mild.  12. The aortic valve is tricuspid. Aortic valve regurgitation is not  visualized. Mild aortic valve sclerosis without stenosis.  13. The pulmonic valve was grossly normal. Pulmonic valve regurgitation is  mild.    Review of Systems: [y] = yes, [ ]  = no   . General: Weight gain [ ] ; Weight loss [ ] ; Anorexia [ ] ; Fatigue [ ] ; Fever [ ] ; Chills [ ] ; Weakness [ ]   . Cardiac: Chest pain/pressure [ ] ; Resting SOB [ ] ; Exertional SOB [ ] ; Orthopnea [ ] ; Pedal Edema [ ] ; Palpitations [ ] ; Syncope [ ] ; Presyncope [ ] ; Paroxysmal nocturnal dyspnea[ ]   . Pulmonary: Cough [ ] ; Wheezing[ ] ; Hemoptysis[ ] ; Sputum [ ] ; Snoring [ ]   . GI: Vomiting[ ] ; Dysphagia[ ] ; Melena[ ] ; Hematochezia [ ] ; Heartburn[ ] ; Abdominal pain [ ] ; Constipation [ ] ; Diarrhea [ ] ; BRBPR [ ]   . GU: Hematuria[ ] ; Dysuria [ ] ; Nocturia[ ]   . Vascular: Pain in legs with walking [ ] ; Pain in feet with lying flat [ ] ; Non-healing sores [ ] ; Stroke [ ] ; TIA [ ] ; Slurred speech [ ] ;  . Neuro: Headaches[ ] ; Vertigo[ ] ; Seizures[ ] ; Paresthesias[ ] ;Blurred vision [ ] ; Diplopia [ ] ; Vision changes [ ]   . Ortho/Skin: Arthritis [  ]; Joint pain [ ] ; Muscle pain [ ] ; Joint swelling [ ] ; Back Pain [ ] ; Rash [ ]   . Psych: Depression[ ] ; Anxiety[ ]   . Heme: Bleeding problems [ ] ; Clotting disorders [ ] ; Anemia [ ]   . Endocrine: Diabetes [ ] ; Thyroid dysfunction[ ]   Home Medications Prior to Admission medications   Medication Sig Start Date End Date Taking? Authorizing Provider  acetaminophen (TYLENOL) 500 MG tablet Take 500-1,000 mg by mouth every 6 (six) hours as needed for headache (pain).   Yes [provider]  ELIQUIS 5 MG TABS tablet TAKE ONE TABLET BY MOUTH TWICE A DAY Patient taking differently: Take 5 mg by mouth 2 (two) times daily.  10/07/19  Yes Croitoru, Mihai, MD  furosemide (LASIX) 40 MG tablet TAKE ONE TABLET BY MOUTH DAILY Patient taking differently: Take 40 mg by mouth daily.  06/14/19  Yes Bensimhon, Shaune Pascal, MD  glipiZIDE (GLUCOTROL) 5 MG tablet Take  1 tablet (5 mg total) by mouth daily. 06/16/16  Yes Charlott Rakes, MD  traMADol (ULTRAM) 50 MG tablet Take 25-50 mg by mouth every 6 (six) hours as needed (pain).  11/25/19  Yes [provider]  azithromycin (ZITHROMAX) 250 MG tablet Take 250-500 mg by mouth See admin instructions. 11/25/2019: take 2 tablets (500 mg) by mouth 1st day, then take 1 tablet (250 mg) daily on days 2-5 11/25/19   [provider]  Blood Glucose Monitoring Suppl (TRUE METRIX METER) DEVI 1 each by Does not apply route 2 (two) times daily. 04/27/15   Charlott Rakes, MD  glucose blood (TRUE METRIX BLOOD GLUCOSE TEST) test strip Use as instructed twice daily 04/27/15   Charlott Rakes, MD  rosuvastatin (CRESTOR) 10 MG tablet Take 1 tablet (10 mg total) by mouth daily. Patient not taking: Reported on 12/04/2019 07/06/19 10/04/19  Croitoru, Dani Gobble, MD  TRUEPLUS LANCETS 28G MISC Use as instructed twice daily. 04/27/15   Charlott Rakes, MD    Past Medical History: Past Medical History:  Diagnosis Date  . Abnormal PFT   . CAD (coronary artery disease), native coronary  artery - 3 vessel 04/21/2015   a. NSTEMI 8-04/2015 felt 2/2 demand ischemia. b. 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  . Chronic diastolic CHF (congestive heart failure) ([place])   . Diabetes mellitus ([place])   . Hypertrophic cardiomyopathy ([place])   . Left atrial enlargement   . Mitral regurgitation    a. Echo 04/2015: moderate mitral regurgitation. b. F/u echo 05/2015: mild MR.  . NSTEMI (non-ST elevated myocardial infarction) ([place]) 11/27/2016  . Paroxysmal atrial fibrillation ([name])   . Tricuspid regurgitation    a. Echo 04/2015: Mod-severe TR. b. Not mentioned on echo 05/2015  . Ventricular tachycardia ([place])    a. s/p St. Jude ICD implanted 04/2015.    Past Surgical History: Past Surgical History:  Procedure Laterality Date  . APPLICATION OF WOUND VAC Left 01/06/2013   Procedure: APPLICATION OF WOUND VAC X 2;  Surgeon: [name], MD;  Location: [facility];  Service: Orthopedics;  Laterality: Left;  left forearm  . APPLICATION OF WOUND VAC Left 01/09/2013   Procedure: APPLICATION OF WOUND VAC;  Surgeon: [name], MD;  Location: [facility];  Service: Orthopedics;  Laterality: Left;  . CARDIAC CATHETERIZATION N/A 04/19/2015   Procedure: Left Heart Cath and Coronary Angiography;  Surgeon: [name], MD;  Location: [place] [facility];  Service: Cardiovascular;  Laterality: N/A;  . CARDIOVERSION N/A 09/20/2015   Procedure: CARDIOVERSION;  Surgeon: Pixie Casino, MD;  Location: [facility];  Service: Cardiovascular;  Laterality: N/A;  . CLIPPING OF ATRIAL APPENDAGE Left 12/04/2016   Procedure: CLIPPING OF ATRIAL APPENDAGE USING ATRICURE 67 ATRICLIP;  Surgeon: Melrose Nakayama, MD;  Location: [place];  Service: Open Heart Surgery;  Laterality: Left;  . CORONARY ARTERY BYPASS GRAFT N/A 12/04/2016   Procedure: CORONARY ARTERY BYPASS GRAFTING (CABG)x 4 WITH ENDOSCOPIC HARVESTING OF RIGHT SAPHENOUS VEIN;  Surgeon: Melrose Nakayama, MD;   Location: [place];  Service: Open Heart Surgery;  Laterality: N/A;  . DENTAL SURGERY  Oct. 2011   several extractions, bone graft  . EP IMPLANTABLE DEVICE N/A 04/16/2015   Procedure: ICD Implant;  Surgeon: [name], MD;  Location: [place] [facility];  Service: Cardiovascular;  Laterality: N/A;  . I & D EXTREMITY Left 01/06/2013   Procedure: IRRIGATION AND DEBRIDEMENT LEFT ELBOW AND LEFT  FOREARM ;  Surgeon: [name], MD;  [facility]: [facility];  Service: Orthopedics;  Laterality: Left;  . INCISION AND DRAINAGE Left 01/09/2013   Procedure: REDO INCISION AND DRAINAGE LEFT ELBOW;  Surgeon: [name], MD;  Location: [facility];  Service: Orthopedics;  Laterality: Left;  SUPINE, UPPER EXTERMITY DRAPE  . INTRAOPERATIVE TRANSESOPHAGEAL ECHOCARDIOGRAM N/A 12/04/2016   Procedure: INTRAOPERATIVE TRANSESOPHAGEAL ECHOCARDIOGRAM;  Surgeon: Melrose Nakayama, MD;  Location: [place];  Service: Open Heart Surgery;  Laterality: N/A;  . IR THORACENTESIS ASP PLEURAL SPACE W/IMG GUIDE  12/22/2016  . LEFT HEART CATH AND CORONARY ANGIOGRAPHY N/A 11/28/2016   Procedure: Left Heart Cath and Coronary Angiography;  Surgeon: Belva Crome, MD;  Location: [place] [facility];  Service: Cardiovascular;  Laterality: N/A;  . MAZE N/A 12/04/2016   Procedure: MAZE PROCEDURE;  Surgeon: Melrose Nakayama, MD;  Location: [place];  Service: Open Heart Surgery;  Laterality: N/A;  . RIGHT HEART CATH N/A 01/02/2017   Procedure: Right Heart Cath;  Surgeon: Jolaine Artist, MD;  Location: [place] [facility];  Service: Cardiovascular;  Laterality: N/A;  . SECONDARY CLOSURE OF WOUND Left 01/09/2013   Procedure: SECONDARY CLOSURE OF WOUND  LEFT ELBOW;  Surgeon: [name], MD;  [facility];  Service: Orthopedics;  Laterality: Left;  . TEE WITHOUT CARDIOVERSION N/A 09/20/2015   Procedure: TRANSESOPHAGEAL ECHOCARDIOGRAM (TEE);  Surgeon: Pixie Casino, MD;  Location[facility];  Service: Cardiovascular;  Laterality: N/A;     Family History: Family History  Problem Relation Age of Onset  . Breast cancer Mother   . Diabetes Father   . Stroke Father   . Heart disease Sister   . Healthy Sister     Social History: Social History   Socioeconomic History  . Marital status: Divorced    Spouse name: Not on file  . Number of children: Not on file  . Years of education: Not on file  . Highest education level: Not on file  Occupational History  . Not on file  Tobacco Use  . Smoking status: Former Smoker    Years: 4.00    Quit date: 12/31/1980    Years since quitting: 38.9  . Smokeless tobacco: Never Used  Substance and Sexual Activity  . Alcohol use: No  . Drug use: No  . Sexual activity: Not Currently  Other Topics Concern  . Not on file  Social History Narrative   Divorced.  Lives with daughter and grandson. 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   Social Determinants of Health   Financial Resource Strain:   . Difficulty of Paying Living Expenses:   Food Insecurity:   . Worried About Charity fundraiser in the Last Year:   . Arboriculturist in the Last Year:   Transportation Needs:   . Film/video editor (Medical):   [name] Lack of Transportation (Non-Medical):   Physical Activity:   . Days of Exercise per Week:   . Minutes of Exercise per Session:   Stress:   . Feeling of Stress :   Social Connections:   . Frequency of Communication with Friends and Family:   . Frequency of Social Gatherings with Friends and Family:   . Attends Religious Services:   . Active Member of Clubs or Organizations:   . Attends Archivist Meetings:   [name] Marital Status:     Allergies:  Allergies  Allergen Reactions  . Levaquin [Levofloxacin]  Other (See Comments)    Suicidal thoughts, extreme anxiety, panic attack  . Phenergan [Promethazine Hcl] Other (See Comments)    Restless    Objective:    Vital Signs:   Temp:  [97.5 F (36.4 C)-98.1 F (36.7 C)] 97.5 F (36.4 C) (05/03  0806) Pulse Rate:  [69-103] 69 (05/03 0806) Resp:  [16-32] 20 (05/03 0447) BP: (118-147)/(79-105) 138/94 (05/03 0806) SpO2:  [86 %-98 %] 93 % (05/03 0806) Weight:  [76.7 kg-79.6 kg] 79.6 kg (05/03 0447)    Weight change: Filed Weights   12/04/19 1245 12/05/19 0447  Weight: 76.7 kg 79.6 kg    Intake/Output:   Intake/Output Summary (Last 24 hours) at 12/05/2019 1046 Last data filed at 12/05/2019 0600 Gross per 24 hour  Intake 480 ml  Output 1700 ml  Net -1220 ml      Physical Exam    General:  fatigue appearing  WF, tearful. No resp difficulty HEENT: normal Neck: supple. JVP elevated to ear . Carotids 2+ bilat; no bruits. No lymphadenopathy or thyromegaly appreciated. Cor: PMI nondisplaced. Irreguarlly irregular rhythm. No rubs, gallops or murmurs. Lungs: clear. No wheezing  Abdomen: obese but soft, nontender, nondistended. No hepatosplenomegaly. No bruits or masses. Good bowel sounds. Extremities: no cyanosis, clubbing, rash, edema Neuro: alert & orientedx3, cranial nerves grossly intact. moves all 4 extremities w/o difficulty. Affect pleasant   Telemetry   Atrial fibrillation 110s  EKG    Atrial fibrillation w/ RVR 115 bpm   Labs   Basic Metabolic Panel: Recent Labs  Lab 12/04/19 1324 12/04/19 1637 12/05/19 0442  NA 138  --  142  K 3.5  --  3.8  CL 106  --  108  CO2 20*  --  20*  GLUCOSE 187*  --  134*  BUN 20  --  18  CREATININE 1.06*  --  0.98  CALCIUM 9.0  --  9.1  MG  --  1.7  --   PHOS  --  4.4  --     Liver Function Tests: Recent Labs  Lab 12/05/19 0442  AST 33  ALT 28  ALKPHOS 103  BILITOT 1.2  PROT 6.7  ALBUMIN 3.3*   No results for input(s): LIPASE, AMYLASE in the last 168 hours. No results for input(s): AMMONIA in the last 168 hours.  CBC: Recent Labs  Lab 12/04/19 1324 12/05/19 0442  WBC 8.6 8.3  HGB 11.2* 11.1*  HCT 37.6 37.1  MCV 88.7 88.5  PLT 310 328    Cardiac Enzymes: No results for input(s): CKTOTAL, CKMB,  CKMBINDEX, TROPONINI in the last 168 hours.  BNP: BNP (last 3 results) Recent Labs    12/04/19 1324  BNP 581.1*    ProBNP (last 3 results) No results for input(s): PROBNP in the last 8760 hours.   CBG: Recent Labs  Lab 12/04/19 1246 12/04/19 1636 12/04/19 2315 12/05/19 0802  GLUCAP 178* 166* 172* 150*    Coagulation Studies: No results for input(s): LABPROT, INR in the last 72 hours.   Imaging   DG Chest Port 1 View  Result Date: 12/04/2019 CLINICAL DATA:  Right-sided chest pain. EXAM: PORTABLE CHEST 1 VIEW COMPARISON:  02/10/2017 FINDINGS: Stable heart size and appearance of pacing/ICD device and left atrial appendage clip. Status post prior CABG. Lungs demonstrate evidence pulmonary interstitial edema. No focal airspace consolidation, pleural fluid or pneumothorax identified. IMPRESSION: Pulmonary interstitial edema. Electronically Signed   By: Aletta Edouard M.D.   On: 12/04/2019 14:17   CT ANGIO  CHEST AORTA W/CM &/OR WO/CM  Result Date: 12/04/2019 CLINICAL DATA:  Chest pain and back pain. Aortic dissection suspected. EXAM: CT ANGIOGRAPHY CHEST WITH CONTRAST TECHNIQUE: Multidetector CT imaging of the chest was performed using the standard protocol during bolus administration of intravenous contrast. Multiplanar CT image reconstructions and MIPs were obtained to evaluate the vascular anatomy. CONTRAST:  192mL OMNIPAQUE IOHEXOL 350 MG/ML SOLN COMPARISON:  None. FINDINGS: Cardiovascular: There is no evidence for a thoracic aortic dissection. There is no evidence for thoracic aortic aneurysm. Atherosclerotic changes are noted of the thoracic aorta. The main pulmonary artery is slightly dilated. There is no large centrally located pulmonary embolism. Detection of smaller pulmonary emboli is limited by technique. The heart is enlarged. There is no significant pericardial effusion. The patient is status post prior CABG. Coronary artery calcifications are noted. Extensive collateral  veins are noted throughout the chest wall which are likely secondary to occlusion of the left brachiocephalic and subclavian vein secondary to the presence of a left-sided pacemaker. There is reflux of contrast in the IVC consistent with underlying cardiac dysfunction. Mediastinum/Nodes: --mildly prominent mediastinal and hilar lymph nodes are noted. For example there is a precarinal lymph node measuring approximately 1.4 cm. These are presumably reactive. --No axillary lymphadenopathy. --No supraclavicular lymphadenopathy. --Normal thyroid gland. --The esophagus is unremarkable Lungs/Pleura: There is a small right-sided pleural effusion. There is some mild bilateral interlobular septal thickening. There is a probable trace left-sided pleural effusion. There is no pneumothorax. Upper Abdomen: There may be cholelithiasis within the partially visualized gallbladder. Musculoskeletal: There is a questionable 4.1 x 2.4 cm left-sided breast mass (axial series 5, image 44). There are acute appearing compression fractures of the T4 and T5 vertebral bodies. There is a questionable fracture of the T3 vertebral body. There is an acute fracture of the C7 transverse process. There is a questionable fracture of the superior endplate of the C7 vertebral body. Review of the MIP images confirms the above findings. IMPRESSION: 1. There is no evidence for a thoracic aortic dissection. 2. There are acute appearing compression fractures of the T4 and T5 vertebral bodies. There is a questionable fracture of the T3 vertebral body. There is an acute fracture of the C7 transverse process. There is a questionable fracture through the superior endplate of the C7 vertebral body. 3. There is a questionable 4.1 x 2.4 cm left-sided breast mass. Correlation with physical exam and mammography is recommended. 4. Cardiomegaly with small right-sided pleural effusion and mild bilateral interlobular septal thickening, suggestive of interstitial edema.  5. Reflux of contrast in the IVC consistent with underlying cardiac dysfunction. 6. 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 7. Possible cholelithiasis within the partially visualized gallbladder. Aortic Atherosclerosis (ICD10-I70.0). Electronically Signed   By: Constance Holster M.D.   On: 12/04/2019 21:31      Medications:     Current Medications: . apixaban  5 mg Oral BID  . furosemide  40 mg Intravenous BID  . insulin aspart  0-15 Units Subcutaneous TID WC  . insulin aspart  0-5 Units Subcutaneous QHS     Infusions:    Assessment/Plan    1. 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 Prolonged post-hospital course with low output HF despite relatively preserved EF. RHC suggestive of primarily RV failure.  - EF 45-50%  post CABG 5/18 - Technetium pyrophosphate scan negative for amyloid. - Most recent echo 06/2019 showed mildly reduced LVEF 45-50% w/ Severe asymetric septal hypertrophy no SAM or LVOT gradient. RV ok  - Now admitted w/ NYHA III symptoms + volume overload. CXR w/ interstitial edema + right sided pleural effusion, BNP 581. TSH normal - responding well to IV Lasix but remains volume overloaded.  - continue IV Lasix, can increase to 60 mg bid  - repeat 2D echo to reassesses LVEF.  - consider ARB/ARNi retrial + spiro. Can use Lokelma or Veltassa if limited by hyperkalemia  - can add low dose  blocker, once better diuresed, to help w/ rate control of afib  - will interrogate ICD    2. Atrial Fibrillation  - prior h/o Afib/ Flutter w/ previous cardioversions + MAZE and LAA clip in 2018 at time of CABG. On chronic a/c w/ Eliquis - now back in AFib. Duration unknown as she has not been highly symptomatic other than dyspnea for at least 4 weeks. Last EKG 12/20  showed NSR - TSH normal - recurrence also in the setting of a/c systolic + diastolic HF - would benefit from DCCV however may need interruption of a/c to allow w/u for new left breast mass - for now, will plan rate control. Avoid further use of Cardizem given LV dysfunction. Can use  blocker to control rate +/- digoxin - Will interrogate device to see how long she has been in afib   3. Apical hypertrophic cardiomyopathy  - Most recent echo 06/2019 showed mildly reduced LVEF 45-50% w/ Severe asymetric septal hypertrophy no SAM or LVOT gradient - denies syncope - has ICD  4. CAD - s/p CABG x 4 in 2018 - no anginal symptoms.  - hs troponin normal x 2, 15>>16 - not on ASA due to Eliquis  - consider addition of  blocker - resume home rosuvastatin   5. Left Breast Mass - incidental finding noted on Chest CT 5/2, measuring 4.1 x 2.4 cm  - She has not had routine mammograms. Her mother had breast cancer, diagnosed in her 12s.  - she will need tissue biopsy to r/o malignancy   6. Multilevel Vertebral Compression Fractures  -  found to have acute appearing, multilevel vertebral  compression fractures (C7 vertebral body/ transfer process, T4 and T5 vertebral bodies, ? T3 vertebral body).  - IR consulted for potential hypoplasty   Length of Stay: 1  Brittainy Simmons, PA-C  12/05/2019, 10:46 AM  Advanced Heart Failure Team Pager [phone] (M-F; 7a - 4p)  Please contact [facility] for night-coverage after hours (4p -7a ) and weekends on [email or web address]  Patient seen with PA, agree with the above note.   Patient reports increased exertional dyspnea since losartan was stopped in 12/20, increasingly worse dyspnea over the last month.  She has back severe mid to upper back pain over the last couple of weeks.  The back pain actually brought her to the ER, she was found to have to have multiple vertebral compression fractures.  She was in atrial fibrillation, initially in RVR now HR 90s not on any  meds for rate control currently.  CXR showed pulmonary edema.  She was also noted by CT to have a left breast mass (no routine mammograms).   She has been started on IV Lasix with some diuresis so far.  Still with back and neck pain.   General: NAD Neck: JVP 12-13 cm, no thyromegaly or thyroid nodule.  Lungs:  Clear to auscultation bilaterally with normal respiratory effort. CV: Nondisplaced PMI.  Heart irregular S1/S2, no S3/S4, no murmur.  No peripheral edema.  No carotid bruit.  Normal pedal pulses.  Abdomen: Soft, nontender, no hepatosplenomegaly, no distention.  Skin: Intact without lesions or rashes.  Neurologic: Alert and oriented x 3.  Psych: Normal affect. Extremities: No clubbing or cyanosis.  HEENT: Normal.   1. Acute on chronic diastolic CHF: Suspected hypertrophic cardiomyopathy.  Echo in 11/20 with EF 45-50%, severe asymmetric septal hypertrophy (no SAM and no significant LVOT gradient), normal RV (images reviewed).  St Jude ICD.  She appears to have developed acute CHF exacerbation in the setting of atrial fibrillation.  Unsure when atrial fibrillation began, but worsening dyspnea x 1 month. Pulmonary edema on CXR.  Still with some volume overload on exam.  - Lasix 60 mg IV bid, follow I/Os closely.  - I think she would benefit from DCCV (CHF may be driven by atrial fibrillation).  2. Atrial fibrillation: Patient has h/o atrial fibrillation, but this is the first recorded episode since Maze in 2018.  Also s/p LAA clipping. She is in rate-controlled atrial fibrillation currently. She reports no missed doses of apixaban.  - Would add low dose Toprol XL 25 mg daily. She apparently has had a hard time with beta blockers in the past, will try low dose Toprol. - I think that it would be best to get her back into NSR, think afib is driving CHF => will tentatively schedule DCCV tomorrow (has not missed apixaban doses per her report), but see below concerns.   - Continue apixaban 5 mg bid.   - Since this is her first documented atrial fibrillation recurrence since 2018 and she has had Maze, would hold off on antiarrhythmic at this time.  3. CAD: s/p CABG 2018.  No chest pain.  Troponin not elevated.  - Continue statin.  - No ASA with anticoagulation.  4. Vertebral compression fractures: Must consider possibility that these are related to metastatic disease.  - Bone scan ordered, ?kyphoplasty => if kyphoplasty planned, cardioversion will complicate as she will have to be on anticoagulation w/o interruption for 1 month.  5. Left breast mass: Has FH breast cancer.  Has not been getting mammograms.   - She will need oncology workup, suspect this will need to be as outpatient.  - DCCV will complicate as she will need to be on uninterrupted anticoagulation for 1 month post-DCCV.  Think she needs to come out of atrial fibrillation as it likely drives CHF. Can see if there is any way to do breast biopsy as inpatient prior to DCCV.  Loralie Champagne 12/05/2019 12:28 PM

## 2019-12-05 NOTE — Progress Notes (Addendum)
12/05/2019 4:20 PM ADDENDUM: Consult was canceled and Eliquis resumed.  Heparin was not initiated at this time.  Horton Chin, PharmD.   ANTICOAGULATION CONSULT NOTE - Initial Consult  Pharmacy Consult for Heparin Indication: atrial fibrillation  Allergies  Allergen Reactions  . Levaquin [Levofloxacin] Other (See Comments)    Suicidal thoughts, extreme anxiety, panic attack  . Phenergan [Promethazine Hcl] Other (See Comments)    Restless    Patient Measurements: Height: 5\' 4"  (162.6 cm) Weight: 79.6 kg (175 lb 8 oz) IBW/kg (Calculated) : 54.7 Heparin Dosing Weight: 70.9 kg  Vital Signs: Temp: 97.6 F (36.4 C) (05/03 1527) Temp Source: Oral (05/03 1527) BP: 129/88 (05/03 1527) Pulse Rate: 91 (05/03 1527)  Labs: Recent Labs    12/04/19 1324 12/04/19 1637 12/05/19 0442  HGB 11.2*  --  11.1*  HCT 37.6  --  37.1  PLT 310  --  328  CREATININE 1.06*  --  0.98  TROPONINIHS 15 16  --     Estimated Creatinine Clearance: 53.8 mL/min (by C-G formula based on SCr of 0.98 mg/dL).   Medical History: Past Medical History:  Diagnosis Date  . Abnormal PFT   . CAD (coronary artery disease), native coronary artery - 3 vessel 04/21/2015   a. NSTEMI 8-04/2015 felt 2/2 demand ischemia. b. 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  . Chronic diastolic CHF (congestive heart failure) ([place])   . Diabetes mellitus ([place])   . Hypertrophic cardiomyopathy ([place])   . Left atrial enlargement   . Mitral regurgitation    a. Echo 04/2015: moderate mitral regurgitation. b. F/u echo 05/2015: mild MR.  . NSTEMI (non-ST elevated myocardial infarction) ([place]) 11/27/2016  . Paroxysmal atrial fibrillation ([name])   . Tricuspid regurgitation    a. Echo 04/2015: Mod-severe TR. b. Not mentioned on echo 05/2015  . Ventricular tachycardia ([place])    a. s/p St. Jude ICD implanted 04/2015.    Medications:  Scheduled:  . apixaban  5 mg Oral BID  . furosemide   60 mg Intravenous BID  . insulin aspart  0-15 Units Subcutaneous TID WC  . insulin aspart  0-5 Units Subcutaneous QHS  . metoprolol succinate  25 mg Oral Daily  . Vitamin D (Ergocalciferol)  50,000 Units Oral Q7 days   Infusions:    Assessment: 74 yo F with new L breast mass.  Pt on Eliquis PTA for pAF.  Plans to hold Eliquis and bridge with heparin infusion given need for breast biopsy.  Last dose of Eliquis was 5/3 at 0900.  Will start heparin infusion at 9pm tonight.  Eliquis can interfere with heparin level monitoring.  Will need to utilize aPTTs for heparin monitoring until Eliquis is cleared from the system.  Will check baseline heparin level and aPTT.  Goal of Therapy:  Heparin level 0.3-0.7 units/ml aPTT 66-102 seconds Monitor platelets by anticoagulation protocol: Yes   Plan:  Baseline Heparin level and aPTT. Start heparin at 1000 units/hr at 9pm tonight. Check aPTT at 0500 5/4 (8 hours after infusion started). Heparin level, CBC, and aPTT daily while on heparin.  [employer], Pharm.D., BCPS Clinical Pharmacist Clinical phone for 12/05/2019 is [phone].  **Pharmacist phone directory can be found on [email or web address] listed under [place].  12/05/2019 4:13 PM

## 2019-12-05 NOTE — Progress Notes (Signed)
NIR requested by Dr. Reesa Chew for management of multiple compression fractures.  CTA chest 12/04/2019 reviewed by Dr. Estanislado Pandy who recommends a NM bone scan to evaluate acuity of fractures (unable to get MR due to pacemaker). Order placed. Will make Dr. Reesa Chew aware.  NIR to follow.   Bea Graff Kadi Hession, PA-C 12/05/2019, 9:35 AM

## 2019-12-06 ENCOUNTER — Encounter (HOSPITAL_COMMUNITY): Payer: Self-pay | Admitting: Internal Medicine

## 2019-12-06 ENCOUNTER — Inpatient Hospital Stay (HOSPITAL_COMMUNITY): Payer: PPO

## 2019-12-06 ENCOUNTER — Encounter (HOSPITAL_COMMUNITY)
Admission: EM | Disposition: A | Discharge status: Home Health | Payer: Self-pay | Source: Home / Self Care | Attending: Internal Medicine

## 2019-12-06 DIAGNOSIS — I509 Heart failure, unspecified: Secondary | ICD-10-CM

## 2019-12-06 DIAGNOSIS — I5021 Acute systolic (congestive) heart failure: Secondary | ICD-10-CM | POA: Diagnosis not present

## 2019-12-06 DIAGNOSIS — C50912 Malignant neoplasm of unspecified site of left female breast: Secondary | ICD-10-CM

## 2019-12-06 DIAGNOSIS — I4891 Unspecified atrial fibrillation: Secondary | ICD-10-CM | POA: Diagnosis not present

## 2019-12-06 DIAGNOSIS — C7951 Secondary malignant neoplasm of bone: Secondary | ICD-10-CM | POA: Diagnosis not present

## 2019-12-06 DIAGNOSIS — C50919 Malignant neoplasm of unspecified site of unspecified female breast: Secondary | ICD-10-CM

## 2019-12-06 DIAGNOSIS — Z7189 Other specified counseling: Secondary | ICD-10-CM

## 2019-12-06 HISTORY — DX: Malignant neoplasm of unspecified site of unspecified female breast: C50.919

## 2019-12-06 HISTORY — DX: Other specified counseling: Z71.89

## 2019-12-06 LAB — COMPREHENSIVE METABOLIC PANEL
ALT: 25 U/L (ref 0–44)
AST: 28 U/L (ref 15–41)
Albumin: 3.3 g/dL — ABNORMAL LOW (ref 3.5–5.0)
Alkaline Phosphatase: 101 U/L (ref 38–126)
Anion gap: 12 (ref 5–15)
BUN: 23 mg/dL (ref 8–23)
CO2: 23 mmol/L (ref 22–32)
Calcium: 9 mg/dL (ref 8.9–10.3)
Chloride: 103 mmol/L (ref 98–111)
Creatinine, Ser: 1.66 mg/dL — ABNORMAL HIGH (ref 0.44–1.00)
GFR calc Af Amer: 36 mL/min — ABNORMAL LOW (ref >60)
GFR calc non Af Amer: 31 mL/min — ABNORMAL LOW (ref >60)
Glucose, Bld: 141 mg/dL — ABNORMAL HIGH (ref 70–99)
Potassium: 3.6 mmol/L (ref 3.5–5.1)
Sodium: 138 mmol/L (ref 135–145)
Total Bilirubin: 1.1 mg/dL (ref 0.3–1.2)
Total Protein: 6.5 g/dL (ref 6.5–8.1)

## 2019-12-06 LAB — CBC
HCT: 36.8 % (ref 36.0–46.0)
Hemoglobin: 11 g/dL — ABNORMAL LOW (ref 12.0–15.0)
MCH: 26.5 pg (ref 26.0–34.0)
MCHC: 29.9 g/dL — ABNORMAL LOW (ref 30.0–36.0)
MCV: 88.7 fL (ref 80.0–100.0)
Platelets: 340 10*3/uL (ref 150–400)
RBC: 4.15 MIL/uL (ref 3.87–5.11)
RDW: 17.3 % — ABNORMAL HIGH (ref 11.5–15.5)
WBC: 7.8 10*3/uL (ref 4.0–10.5)
nRBC: 0 % (ref 0.0–0.2)

## 2019-12-06 LAB — GLUCOSE, CAPILLARY
Glucose-Capillary: 109 mg/dL — ABNORMAL HIGH (ref 70–99)
Glucose-Capillary: 126 mg/dL — ABNORMAL HIGH (ref 70–99)
Glucose-Capillary: 151 mg/dL — ABNORMAL HIGH (ref 70–99)
Glucose-Capillary: 142 mg/dL — ABNORMAL HIGH (ref 70–99)

## 2019-12-06 LAB — ECHOCARDIOGRAM COMPLETE
Height: 64 in
Weight: 2817.6 oz

## 2019-12-06 LAB — HEPARIN LEVEL (UNFRACTIONATED): Heparin Unfractionated: >2.2 IU/mL — ABNORMAL HIGH (ref 0.30–0.70)

## 2019-12-06 LAB — CANCER ANTIGEN 27.29: CA 27.29: 704 U/mL — ABNORMAL HIGH (ref 0.0–38.6)

## 2019-12-06 LAB — MAGNESIUM: Magnesium: 1.7 mg/dL (ref 1.7–2.4)

## 2019-12-06 LAB — APTT
aPTT: 91 seconds — ABNORMAL HIGH (ref 24–36)
aPTT: 100 seconds — ABNORMAL HIGH (ref 24–36)

## 2019-12-06 SURGERY — CARDIOVERSION
Anesthesia: General

## 2019-12-06 MED ORDER — AMIODARONE HCL IN DEXTROSE 360-4.14 MG/200ML-% IV SOLN
30.0000 mg/h | INTRAVENOUS | Status: DC
  Administered 2019-12-06 – 2019-12-10 (×6): 30 mg/h via INTRAVENOUS
  Filled 2019-12-06 (×7): qty 200

## 2019-12-06 MED ORDER — ZOLEDRONIC ACID 4 MG/5ML IV CONC
4.0000 mg | Freq: Once | INTRAVENOUS | Status: AC
  Administered 2019-12-06: 4 mg via INTRAVENOUS
  Filled 2019-12-06: qty 5

## 2019-12-06 MED ORDER — TECHNETIUM TC 99M MEDRONATE IV KIT
20.0000 | PACK | Freq: Once | INTRAVENOUS | Status: AC | PRN
  Administered 2019-12-06: 20 via INTRAVENOUS

## 2019-12-06 MED ORDER — MAGNESIUM OXIDE 400 (241.3 MG) MG PO TABS
800.0000 mg | ORAL_TABLET | ORAL | Status: AC
  Administered 2019-12-06 (×2): 800 mg via ORAL
  Filled 2019-12-06 (×2): qty 2

## 2019-12-06 MED ORDER — POTASSIUM CHLORIDE CRYS ER 20 MEQ PO TBCR
40.0000 meq | EXTENDED_RELEASE_TABLET | Freq: Once | ORAL | Status: AC
  Administered 2019-12-06: 40 meq via ORAL
  Filled 2019-12-06: qty 2

## 2019-12-06 MED ORDER — AMIODARONE LOAD VIA INFUSION
150.0000 mg | Freq: Once | INTRAVENOUS | Status: AC
  Administered 2019-12-06: 150 mg via INTRAVENOUS
  Filled 2019-12-06: qty 83.34

## 2019-12-06 MED ORDER — AMIODARONE HCL IN DEXTROSE 360-4.14 MG/200ML-% IV SOLN
60.0000 mg/h | INTRAVENOUS | Status: AC
  Administered 2019-12-06: 60 mg/h via INTRAVENOUS

## 2019-12-06 NOTE — Progress Notes (Signed)
Pt's MEWS color changed to yellow due to heart rate - MD already aware from morning rounds regarding HR - information was discussed between charge RN and primary RN - initiation of yellow MEWS protocol implemented.

## 2019-12-06 NOTE — Progress Notes (Addendum)
PROGRESS NOTE    [name]  [id number] DOB: Apr 15, 1946 DOA: 12/04/2019 PCP: Maurice Small, MD   Brief Narrative:  74 year old with history of paroxysmal A. fib on Eliquis, systolic CHF status post CABG, ventricular tachycardia status post ICD placement 1113, HTN, hypertrophic cardiomyopathy, HLD who presented with chest pain and back pain ongoing for 1 month.  In ED noted to be in atrial fibrillation with RVR, CHF exacerbation.  Cardiology and hospitalist team consulted for admission and further care.  Diagnosed with left breast mass with concerns for possible metastases.  Difficulty getting patient diagnosis.  Oncology working to see if something can be done inpatient.  Getting bone scan to see if we can find any biopsy sites   Assessment & Plan:   Principal Problem:   Chest pain Active Problems:   Diabetes mellitus type 2, noninsulin dependent ([name])   Systolic CHF ([name])   CAD (coronary artery disease)   HLD (hyperlipidemia)   Paroxysmal atrial fibrillation ([name])   CKD (chronic kidney disease) stage 3, GFR 30-59 ml/min   S/P CABG x 5   HTN (hypertension)   Breast cancer metastasized to bone, unspecified laterality ([name])   Goals of care, counseling/discussion  Atypical chest pain and upper back pain -Most likely not cardiac in nature.  This is referred pain from her thoracic spine fracture.  Pain management. -CT of the chest did show thoracic compression fractures, C7 acute fracture.  Questionable left breast mass, cardiomegaly with pleural effusion interstitial edema  Compression fracture of thoracic vertebrae and acute C7 transverse process -IR consulted, bone scan has been ordered.  In case positive this will help in determining kyphoplasty and possible biopsy site. -Zometa ordered by Onc -For C7 transverse spine fracture patient can just use soft collar 4-6 weeks and pain control.  Curbside neurosurgery.  Left-sided breast mass, upper quadrant -Family history.  This is  concerning for breast cancer with metastatic disease.  Oncology team consulted working on obtaining tissue diagnosis.  Due to logistics it is difficult to obtain inpatient breast tissue biopsy.  Acute congestive heart failure with reduced ejection fraction, 45%, class III Hypertrophic cardiomyopathy CAD status post CABG -Diuretics held due to acute kidney injury. -Strict input and output, daily weight -Cardiology team consulted  Acute kidney injury on CKD stage II -Baseline creatinine 0.9, increased to 1.66.  Suspect secondary to IV diuretics or possibly contrast related.  Will hold IV diuretics today and monitor her renal function.  Atrial fibrillation with RVR, rate controlled -Continue Eliquis.  Ideally cardiology would like cardioversion with 3 to 4 weeks of anticoagulation but currently on hold until we obtain tissue diagnosis.   Vitamin D deficiency -Vitamin D supplements 50,000 units every 7 days.  History of ventricular tachycardia status post ICD placement -Monitor closely  Diabetes mellitus type 2 -Sliding scale and Accu-Chek, hemoglobin A1c 7.7  Hyperlipidemia -Continue statin, LDL at goal-61  History of anxiety/depression -Resume home meds  Unintentional weight loss -TSH- Neg -HIV- pending  DVT prophylaxis: Elquis Code Status: Full code Family Communication: None  Status is: Inpatient  Remains inpatient appropriate because:Ongoing diagnostic testing needed not appropriate for outpatient work up   Dispo: The patient is from: Home              Anticipated d/c is to: Home              Anticipated d/c date is: 2 days              Patient currently undergoing  work-up for breast mass and multiple compression fracture complicated by A. fib with RVR and CHF exacerbation.   Subjective: Patient is sitting up in the bed quite emotional regarding her potential diagnosis.  No other specific complaints at the moment.  Review of Systems Otherwise negative except as  per HPI, including: General: Denies fever, chills, night sweats or unintended weight loss. Resp: Denies cough, wheezing, shortness of breath. Cardiac: Denies chest pain, palpitations, orthopnea, paroxysmal nocturnal dyspnea. GI: Denies abdominal pain, nausea, vomiting, diarrhea or constipation GU: Denies dysuria, frequency, hesitancy or incontinence MS: Denies muscle aches, joint pain or swelling Neuro: Denies headache, neurologic deficits (focal weakness, numbness, tingling), abnormal gait Psych: Denies anxiety, depression, SI/HI/AVH Skin: Denies new rashes or lesions ID: Denies sick contacts, exotic exposures, travel  Examination:  Constitutional: Not in acute distress, 2 L nasal cannula Respiratory: Bibasilar crackles Cardiovascular: Irregularly irregular Abdomen: Nontender nondistended good bowel sounds Musculoskeletal: No edema noted Skin: No rashes seen Neurologic: CN 2-12 grossly intact.  And nonfocal Psychiatric: Overall anxious otherwise alert awake oriented.  Breast exam performed by me on 5/3 with the help of nurse chaperone.  Documentation on progress note from 5/3.  Objective: Vitals:   12/05/19 2033 12/06/19 0100 12/06/19 0400 12/06/19 0817  BP: 117/74 (!) 128/58 126/77 100/68  Pulse: 83 100 98 65  Resp: 19 17 20 16   Temp: 97.8 F (36.6 C) 98.2 F (36.8 C) 98.1 F (36.7 C) (!) 97.5 F (36.4 C)  TempSrc: Oral Oral Oral Oral  SpO2: 96% 97% 99% 94%  Weight:   79.9 kg   Height:        Intake/Output Summary (Last 24 hours) at 12/06/2019 1028 Last data filed at 12/06/2019 0500 Gross per 24 hour  Intake 65.74 ml  Output 900 ml  Net -834.26 ml   Filed Weights   12/04/19 1245 12/05/19 0447 12/06/19 0400  Weight: 76.7 kg 79.6 kg 79.9 kg     Data Reviewed:   CBC: Recent Labs  Lab 12/04/19 1324 12/05/19 0442 12/06/19 0402  WBC 8.6 8.3 7.8  HGB 11.2* 11.1* 11.0*  HCT 37.6 37.1 36.8  MCV 88.7 88.5 88.7  PLT 310 328 [id number]   Basic Metabolic Panel: Recent  Labs  Lab 12/04/19 1324 12/04/19 1637 12/05/19 0442 12/06/19 0402  NA 138  --  142 138  K 3.5  --  3.8 3.6  CL 106  --  108 103  CO2 20*  --  20* 23  GLUCOSE 187*  --  134* 141*  BUN 20  --  18 23  CREATININE 1.06*  --  0.98 1.66*  CALCIUM 9.0  --  9.1 9.0  MG  --  1.7  --  1.7  PHOS  --  4.4  --   --    GFR: Estimated Creatinine Clearance: 31.8 mL/min (A) (by C-G formula based on SCr of 1.66 mg/dL (H)). Liver Function Tests: Recent Labs  Lab 12/05/19 0442 12/06/19 0402  AST 33 28  ALT 28 25  ALKPHOS 103 101  BILITOT 1.2 1.1  PROT 6.7 6.5  ALBUMIN 3.3* 3.3*   No results for input(s): LIPASE, AMYLASE in the last 168 hours. No results for input(s): AMMONIA in the last 168 hours. Coagulation Profile: No results for input(s): INR, PROTIME in the last 168 hours. Cardiac Enzymes: No results for input(s): CKTOTAL, CKMB, CKMBINDEX, TROPONINI in the last 168 hours. BNP (last 3 results) No results for input(s): PROBNP in the last 8760 hours. HbA1C: Recent Labs  12/04/19 1637  HGBA1C 7.7*   CBG: Recent Labs  Lab 12/05/19 0802 12/05/19 1102 12/05/19 1625 12/05/19 2158 12/06/19 0810  GLUCAP 150* 132* 162* 163* 109*   Lipid Profile: Recent Labs    12/04/19 1637 12/05/19 0442  CHOL 105 104  HDL 27* 25*  LDLCALC 59 61  TRIG 93 90  CHOLHDL 3.9 4.2   Thyroid Function Tests: Recent Labs    12/04/19 1637  TSH 3.815   Anemia Panel: No results for input(s): VITAMINB12, FOLATE, FERRITIN, TIBC, IRON, RETICCTPCT in the last 72 hours. Sepsis Labs: No results for input(s): PROCALCITON, LATICACIDVEN in the last 168 hours.  Recent Results (from the past 240 hour(s))  Respiratory Panel by RT PCR (Flu A&B, Covid) - Nasopharyngeal Swab     Status: None   Collection Time: 12/04/19  2:06 PM   Specimen: Nasopharyngeal Swab  Result Value Ref Range Status   SARS Coronavirus 2 by RT PCR NEGATIVE NEGATIVE Final    Comment: (NOTE) SARS-CoV-2 target nucleic acids are NOT  DETECTED. The SARS-CoV-2 RNA is generally detectable in upper respiratoy specimens during the acute phase of infection. The lowest concentration of SARS-CoV-2 viral copies this assay can detect is 131 copies/mL. A negative result does not preclude SARS-Cov-2 infection and should not be used as the sole basis for treatment or other patient management decisions. A negative result may occur with  improper specimen collection/handling, submission of specimen other than nasopharyngeal swab, presence of viral mutation(s) within the areas targeted by this assay, and inadequate number of viral copies (<131 copies/mL). A negative result must be combined with clinical observations, patient history, and epidemiological information. The expected result is Negative. Fact Sheet for Patients:  PinkCheek.be Fact Sheet for Healthcare Providers:  GravelBags.it This test is not yet ap proved or cleared by the [place] FDA and  has been authorized for detection and/or diagnosis of SARS-CoV-2 by FDA under an Emergency Use Authorization (EUA). This EUA will remain  in effect (meaning this test can be used) for the duration of the COVID-19 declaration under Section 564(b)(1) of the Act, 21 U.S.C. section 360bbb-3(b)(1), unless the authorization is terminated or revoked sooner.    Influenza A by PCR NEGATIVE NEGATIVE Final   Influenza B by PCR NEGATIVE NEGATIVE Final    Comment: (NOTE) The Xpert Xpress SARS-CoV-2/FLU/RSV assay is intended as an aid in  the diagnosis of influenza from Nasopharyngeal swab specimens and  should not be used as a sole basis for treatment. Nasal washings and  aspirates are unacceptable for Xpert Xpress SARS-CoV-2/FLU/RSV  testing. Fact Sheet for Patients: PinkCheek.be Fact Sheet for Healthcare Providers: GravelBags.it This test is not yet approved or cleared  by the [place] FDA and  has been authorized for detection and/or diagnosis of SARS-CoV-2 by  FDA under an Emergency Use Authorization (EUA). This EUA will remain  in effect (meaning this test can be used) for the duration of the  Covid-19 declaration under Section 564(b)(1) of the Act, 21  U.S.C. section 360bbb-3(b)(1), unless the authorization is  terminated or revoked. Performed at [facility], [place] [address], [place], [place] [address]          Radiology Studies: DG Chest Port 1 View  Result Date: 12/04/2019 CLINICAL DATA:  Right-sided chest pain. EXAM: PORTABLE CHEST 1 VIEW COMPARISON:  02/10/2017 FINDINGS: Stable heart size and appearance of pacing/ICD device and left atrial appendage clip. Status post prior CABG. Lungs demonstrate evidence pulmonary interstitial edema. No focal airspace consolidation,  pleural fluid or pneumothorax identified. IMPRESSION: Pulmonary interstitial edema. Electronically Signed   By: Aletta Edouard M.D.   On: 12/04/2019 14:17   CT ANGIO CHEST AORTA W/CM &/OR WO/CM  Result Date: 12/04/2019 CLINICAL DATA:  Chest pain and back pain. Aortic dissection suspected. EXAM: CT ANGIOGRAPHY CHEST WITH CONTRAST TECHNIQUE: Multidetector CT imaging of the chest was performed using the standard protocol during bolus administration of intravenous contrast. Multiplanar CT image reconstructions and MIPs were obtained to evaluate the vascular anatomy. CONTRAST:  142mL OMNIPAQUE IOHEXOL 350 MG/ML SOLN COMPARISON:  None. FINDINGS: Cardiovascular: There is no evidence for a thoracic aortic dissection. There is no evidence for thoracic aortic aneurysm. Atherosclerotic changes are noted of the thoracic aorta. The main pulmonary artery is slightly dilated. There is no large centrally located pulmonary embolism. Detection of smaller pulmonary emboli is limited by technique. The heart is enlarged. There is no significant pericardial effusion. The patient is status post prior  CABG. Coronary artery calcifications are noted. 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 There is reflux of contrast in the IVC consistent with underlying cardiac dysfunction. Mediastinum/Nodes: --mildly prominent mediastinal and hilar lymph nodes are noted. For example there is a precarinal lymph node measuring approximately 1.4 cm. These are presumably reactive. --No axillary lymphadenopathy. --No supraclavicular lymphadenopathy. --Normal thyroid gland. --The esophagus is unremarkable Lungs/Pleura: There is a small right-sided pleural effusion. There is some mild bilateral interlobular septal thickening. There is a probable trace left-sided pleural effusion. There is no pneumothorax. Upper Abdomen: There may be cholelithiasis within the partially visualized gallbladder. Musculoskeletal: There is a questionable 4.1 x 2.4 cm left-sided breast mass (axial series 5, image 44). There are acute appearing compression fractures of the T4 and T5 vertebral bodies. There is a questionable fracture of the T3 vertebral body. There is an acute fracture of the C7 transverse process. There is a questionable fracture of the superior endplate of the C7 vertebral body. Review of the MIP images confirms the above findings. IMPRESSION: 1. There is no evidence for a thoracic aortic dissection. 2. There are acute appearing compression fractures of the T4 and T5 vertebral bodies. There is a questionable fracture of the T3 vertebral body. There is an acute fracture of the C7 transverse process. There is a questionable fracture through the superior endplate of the C7 vertebral body. 3. There is a questionable 4.1 x 2.4 cm left-sided breast mass. Correlation with physical exam and mammography is recommended. 4. Cardiomegaly with small right-sided pleural effusion and mild  bilateral interlobular septal thickening, suggestive of interstitial edema. 5. Reflux of contrast in the IVC consistent with underlying cardiac dysfunction. 6. 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 7. Possible cholelithiasis within the partially visualized gallbladder. Aortic Atherosclerosis (ICD10-I70.0). Electronically Signed   By: Constance Holster M.D.   On: 12/04/2019 21:31        Scheduled Meds: . insulin aspart  0-15 Units Subcutaneous TID WC  . insulin aspart  0-5 Units Subcutaneous QHS  . magnesium oxide  800 mg Oral Q4H  . metoprolol succinate  25 mg Oral Daily  . Vitamin D (Ergocalciferol)  50,000 Units Oral Q7 days   Continuous Infusions: . heparin 1,000 Units/hr (12/05/19 2125)  . zoledronic acid (ZOMETA)  IV 4 mg (12/06/19 1016)     LOS: 2 days   Time spent= 35 mins    Keimani Laufer Arsenio Loader, MD [facility]  If 7PM-7AM, please contact night-coverage  12/06/2019, 10:28 AM

## 2019-12-06 NOTE — Progress Notes (Signed)
ANTICOAGULATION CONSULT NOTE - Follow Up Consult  Pharmacy Consult for heparin Indication: atrial fibrillation  Labs: Recent Labs    12/04/19 1324 12/04/19 1324 12/04/19 1637 12/05/19 0442 12/06/19 0402  HGB 11.2*   < >  --  11.1* 11.0*  HCT 37.6  --   --  37.1 36.8  PLT 310  --   --  328 340  APTT  --   --   --   --  91*  CREATININE 1.06*  --   --  0.98  --   TROPONINIHS 15  --  16  --   --    < > = values in this interval not displayed.    Assessment/Plan:  74yo female therapeutic on heparin with initial dosing while Eliquis on hold. Will continue gtt at current rate and confirm stable with additional PTT.   Wynona Neat, PharmD, BCPS  12/06/2019,5:13 AM

## 2019-12-06 NOTE — Progress Notes (Signed)
OT Cancellation Note  Patient Details Name: [name] MRN: [id number] DOB: November 26, 1945   Cancelled Treatment:    Reason Eval/Treat Not Completed: Patient at procedure or test/ unavailable, ECHO in room with planned bone scan after per RN.  Will follow and see as able.   Jolaine Artist, OT Acute Rehabilitation Services Pager [phone] Office [place] 12/06/2019, 11:06 AM

## 2019-12-06 NOTE — Plan of Care (Signed)
  Problem: Activity: Goal: Capacity to carry out activities will improve Outcome: Progressing   Problem: Nutrition: Goal: Adequate nutrition will be maintained Outcome: Progressing   Problem: Pain Managment: Goal: General experience of comfort will improve Outcome: Progressing   Problem: Cardiac: Goal: Ability to achieve and maintain adequate cardiopulmonary perfusion will improve Outcome: Completed/Met

## 2019-12-06 NOTE — Progress Notes (Signed)
[place] for Heparin Indication: atrial fibrillation  Allergies  Allergen Reactions  . Levaquin [Levofloxacin] Other (See Comments)    Suicidal thoughts, extreme anxiety, panic attack  . Phenergan [Promethazine Hcl] Other (See Comments)    Restless    Patient Measurements: Height: 5\' 4"  (162.6 cm) Weight: 79.9 kg (176 lb 1.6 oz) IBW/kg (Calculated) : 54.7 Heparin Dosing Weight: 70.9 kg  Vital Signs: Temp: 97.5 F (36.4 C) (05/04 0817) Temp Source: Oral (05/04 0817) BP: 100/68 (05/04 0817) Pulse Rate: 65 (05/04 0817)  Labs: Recent Labs    12/04/19 1324 12/04/19 1324 12/04/19 1637 12/05/19 0442 12/06/19 0402  HGB 11.2*   < >  --  11.1* 11.0*  HCT 37.6  --   --  37.1 36.8  PLT 310  --   --  328 340  APTT  --   --   --   --  91*  HEPARINUNFRC  --   --   --   --  >2.20*  CREATININE 1.06*  --   --  0.98 1.66*  TROPONINIHS 15  --  16  --   --    < > = values in this interval not displayed.    Estimated Creatinine Clearance: 31.8 mL/min (A) (by C-G formula based on SCr of 1.66 mg/dL (H)).   Medical History: Past Medical History:  Diagnosis Date  . Abnormal PFT   . Breast cancer metastasized to bone, unspecified laterality ([place]) 12/06/2019  . CAD (coronary artery disease), native coronary artery - 3 vessel 04/21/2015   a. NSTEMI 8-04/2015 felt 2/2 demand ischemia. b. 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  . Chronic diastolic CHF (congestive heart failure) ([place])   . Diabetes mellitus ([place])   . Goals of care, counseling/discussion 12/06/2019  . Hypertrophic cardiomyopathy ([place])   . Left atrial enlargement   . Mitral regurgitation    a. Echo 04/2015: moderate mitral regurgitation. b. F/u echo 05/2015: mild MR.  . NSTEMI (non-ST elevated myocardial infarction) ([place]) 11/27/2016  . Paroxysmal atrial fibrillation ([name])   . Tricuspid regurgitation    a. Echo 04/2015: Mod-severe TR. b.  Not mentioned on echo 05/2015  . Ventricular tachycardia ([place])    a. s/p St. Jude ICD implanted 04/2015.    Medications:  Scheduled:  . insulin aspart  0-15 Units Subcutaneous TID WC  . insulin aspart  0-5 Units Subcutaneous QHS  . magnesium oxide  800 mg Oral Q4H  . metoprolol succinate  25 mg Oral Daily  . Vitamin D (Ergocalciferol)  50,000 Units Oral Q7 days   Infusions:  . heparin 1,000 Units/hr (12/05/19 2125)    Assessment: 74 yo F with new L breast mass.  Pt on Eliquis PTA for pAF.  Plans to hold Eliquis and bridge with heparin infusion given need for breast biopsy.  Last dose of Eliquis was 5/3 at 0900.  Will start heparin infusion at 9pm tonight.  Eliquis can interfere with heparin level monitoring.  Will continue to utilize aPTTs for heparin monitoring until Eliquis is cleared.  Aptt this morning continues to be at goal 100s on confirmatory check.   Goal of Therapy:  Heparin level 0.3-0.7 units/ml aPTT 66-102 seconds Monitor platelets by anticoagulation protocol: Yes   Plan:  Continue heparin at 1000 units/hr Heparin level, CBC, and aPTT daily while on heparin.  Erin Hearing PharmD., BCPS Clinical Pharmacist 12/06/2019 11:04 AM

## 2019-12-06 NOTE — Progress Notes (Addendum)
Advanced Heart Failure Rounding Note  PCP-Cardiologist: No primary care provider on file.   Subjective:    Volume status improved w/ IV Lasix, however bump in SCr from 0.98>>1.66.   Remains in Afib, V-rates in the 90s-low 100s. Refuses metoprolol (had side effects in the past).   Appears to have metastatic breast cancer. CA 27.29 is over 700. Vertebral fractures likely pathologic. Getting bone scan today. Zometa started today.   Breast tissue biopsy to be done as outpatient.      Objective:   Weight Range: 79.9 kg Body mass index is 30.23 kg/m.   Vital Signs:   Temp:  [97.5 F (36.4 C)-98.3 F (36.8 C)] 97.5 F (36.4 C) (05/04 0817) Pulse Rate:  [65-116] 65 (05/04 0817) Resp:  [16-23] 16 (05/04 0817) BP: (100-133)/(53-88) 100/68 (05/04 0817) SpO2:  [89 %-99 %] 94 % (05/04 0817) Weight:  [79.9 kg] 79.9 kg (05/04 0400)    Weight change: Filed Weights   12/04/19 1245 12/05/19 0447 12/06/19 0400  Weight: 76.7 kg 79.6 kg 79.9 kg    Intake/Output:   Intake/Output Summary (Last 24 hours) at 12/06/2019 1131 Last data filed at 12/06/2019 0500 Gross per 24 hour  Intake 65.74 ml  Output 900 ml  Net -834.26 ml      Physical Exam    General:  Fatigue appearing, tearful. No resp difficulty HEENT: Normal Neck: Supple. JVP . Carotids 2+ bilat; no bruits. No lymphadenopathy or thyromegaly appreciated. Cor: PMI nondisplaced. Irregularly irregular rhythm, mildly tachy rate. No rubs, gallops or murmurs. Lungs: Clear Abdomen: Soft, nontender, nondistended. No hepatosplenomegaly. No bruits or masses. Good bowel sounds. Extremities: No cyanosis, clubbing, rash, edema Neuro: Alert & orientedx3, cranial nerves grossly intact. moves all 4 extremities w/o difficulty. Affect pleasant   Telemetry   Persistent atrial fibrillation, 90s-low 100s   EKG    No new EKG to review today   Labs    CBC Recent Labs    12/05/19 0442 12/06/19 0402  WBC 8.3 7.8  HGB 11.1* 11.0*    HCT 37.1 36.8  MCV 88.5 88.7  PLT 328 [id number]   Basic Metabolic Panel Recent Labs    12/04/19 1324 12/04/19 1637 12/05/19 0442 12/06/19 0402  NA   < >  --  142 138  K   < >  --  3.8 3.6  CL   < >  --  108 103  CO2   < >  --  20* 23  GLUCOSE   < >  --  134* 141*  BUN   < >  --  18 23  CREATININE   < >  --  0.98 1.66*  CALCIUM   < >  --  9.1 9.0  MG  --  1.7  --  1.7  PHOS  --  4.4  --   --    < > = values in this interval not displayed.   Liver Function Tests Recent Labs    12/05/19 0442 12/06/19 0402  AST 33 28  ALT 28 25  ALKPHOS 103 101  BILITOT 1.2 1.1  PROT 6.7 6.5  ALBUMIN 3.3* 3.3*   No results for input(s): LIPASE, AMYLASE in the last 72 hours. Cardiac Enzymes No results for input(s): CKTOTAL, CKMB, CKMBINDEX, TROPONINI in the last 72 hours.  BNP: BNP (last 3 results) Recent Labs    12/04/19 1324  BNP 581.1*    ProBNP (last 3 results) No results for input(s): PROBNP in the last 8760  hours.   D-Dimer No results for input(s): DDIMER in the last 72 hours. Hemoglobin A1C Recent Labs    12/04/19 1637  HGBA1C 7.7*   Fasting Lipid Panel Recent Labs    12/05/19 0442  CHOL 104  HDL 25*  LDLCALC 61  TRIG 90  CHOLHDL 4.2   Thyroid Function Tests Recent Labs    12/04/19 1637  TSH 3.815    Other results:   Imaging     No results found.   Medications:     Scheduled Medications: . amiodarone  150 mg Intravenous Once  . insulin aspart  0-15 Units Subcutaneous TID WC  . insulin aspart  0-5 Units Subcutaneous QHS  . metoprolol succinate  25 mg Oral Daily  . Vitamin D (Ergocalciferol)  50,000 Units Oral Q7 days     Infusions: . amiodarone     Followed by  . amiodarone    . heparin 1,000 Units/hr (12/05/19 2125)     PRN Medications:  acetaminophen **OR** acetaminophen, morphine injection, oxyCODONE, polyethylene glycol, senna-docusate    Assessment/Plan   1. Acute on Chronic Combines Systolic and Diastolic HF - She is  s/p VF arrest and CABG in 4/18. Prolonged post-hospital course with low output HF despite relatively preserved EF. RHC suggestive of primarily RV failure. -EF 45-50% post CABG 5/18 - Technetium pyrophosphate scan negative for amyloid. - Most recent echo 06/2019 showed mildly reduced LVEF 45-50% w/ Severe asymetric septal hypertrophy no SAM or LVOT gradient. RV ok  - Now admitted w/ NYHA III symptoms + mild volume overload. CXR w/ interstitial edema + right sided pleural effusion, BNP 581. TSH normal. Device interrogation 5/2 showed CorVue c/w mild volume overload, in the setting of persistent Afib (since 10/07/19) - responding well to IV Lasix. Volume status improved - Stop IV Lasix today given bump in SCr from 0.98>>1.66  - repeat 2D echo to reassesses LVEF. Also helpful to establish new baseline, as she will likely need to start chemo for metastatic breast CA. Depending on therapies selected, may need surveillance echos q 3-6 months  - consider ARB/ARNi retrial + spiro once SCr normalizes. Can use Lokelma or Veltassa if limited by hyperkalemia (was on losartan previously, discontinued 12/20 for hyperkalemia) - She is refusing  blocker due to side effects w/ metoprolol previously    2. Atrial Fibrillation  - prior h/o Afib/ Flutter w/ previous cardioversions + MAZE and LAA clip in 2018 at time of CABG. On chronic a/c w/ Eliquis PTA (no missed doses in the last 30 days) - now back in AFib. Device interrogation shows that she has been in persistent afib since 10/07/19 - TSH normal  - recurrence also in the setting of a/c systolic + diastolic HF - We will postpone DCCV for now, given likely need for invasive procedures/surgeries for new breast mass, as anticoagulation may need to be held  - She refuses  blocker due to previous side effects.  Avoid use of Cardizem given LV dysfunction.  - Will load w/ IV amiodarone, 150 mg bolus + drip - she is now off of Eliquis and on IV heparin for possible  procedures   3. Apical hypertrophic cardiomyopathy  - Most recent echo 06/2019 showed mildly reduced LVEF 45-50% w/ Severe asymetric septal hypertrophy no SAM or LVOT gradient - denies syncope - has ICD. No VT detection on device interrogation  - repeat echo pending   4. CAD - s/p CABG x 4 in 2018 - no anginal symptoms.  - hs  troponin normal x 2, 15>>16 - not on ASA due to Eliquis  - refuses ? blocker - resume home rosuvastatin   5. Left Breast Mass, likely Metastatic Breast Cancer  - incidental finding noted on Chest CT 5/2, measuring 4.1 x 2.4 cm  - She has not had routine mammograms. Her mother had breast cancer, diagnosed in her 63s.  - CA 29.27 over 700. Oncology following  - she will need tissue biopsy to confirm - getting bone scan today, suspected to have bone mets   6. Multilevel Vertebral Compression Fractures, Likely Cancer Metastases   -  found to have acute appearing, multilevel vertebral  compression fractures (C7 vertebral body/ transfer process, T4 and T5 vertebral bodies, ? T3 vertebral body). No recent falls/trauma  - suspect pathologic bone fractures/ bone mets from breast CA - bone scan pending  - Oncology added Zometa  - Oncology and IR to decide on potential hypoplasty   7. AKI - Bump in SCr from 0.98>>1.66, suspect over diuresis  - stop IV Lasix today  - repeat BMP in am   Length of Stay: 2  Lyda Jester, PA-C  12/06/2019, 11:31 AM  Advanced Heart Failure Team Pager [phone] (M-F; [place])  Please contact [facility] for night-coverage after hours (4p -7a ) and weekends on [email or web address]  Patient seen and examined with the above-signed Advanced Practice Provider and/or Housestaff. I personally reviewed laboratory data, imaging studies and relevant notes. I independently examined the patient and formulated the important aspects of the plan. I have edited the note to reflect any of my changes or salient points. I have personally discussed the  plan with the patient and/or family.  She remains in AF. Rate mildly elevated with resting rates 100-105 mainly on tele. Volume status looks much better with IV lasix but now overdiuresed with rising creatinine,. Echo today with EF 35-40% which is down from 45-50% so suspect likely component of tachy-cardiomyopathy,.She does not feel her AF much.   Have reviewed Oncology's note and unfortunately she seems to have metastatic breast CA and we discussed this. Will need bx to identify receptors. Apparently unable to get bx in-house but expect this will need to be done soon to help define treatment course.   General:  Well appearing. No resp difficulty HEENT: normal Neck: supple. no JVD. Carotids 2+ bilat; no bruits. No lymphadenopathy or thryomegaly appreciated. Cor: PMI nondisplaced. Irregular rate & rhythm. No rubs, gallops or murmurs. Lungs: clear Abdomen: soft, nontender, nondistended. No hepatosplenomegaly. No bruits or masses. Good bowel sounds. Extremities: no cyanosis, clubbing, rash, edema Neuro: alert & orientedx3, cranial nerves grossly intact. moves all 4 extremities w/o difficulty. Affect pleasant  Will hold off on DC-CV now with need for probably biopsy soon. Will switch heparin back to Eliquis. She refuses b-blockers due to previous intolerance but did tolerate amio in past. Will start amio for rate control. Load IV and can switch to po soon. Hold lasix. Recheck creatinine in am.   Glori Bickers, MD  8:03 PM

## 2019-12-06 NOTE — Plan of Care (Signed)
  Problem: Activity: Goal: Capacity to carry out activities will improve Outcome: Progressing   Problem: Nutrition: Goal: Adequate nutrition will be maintained Outcome: Progressing   Problem: Pain Managment: Goal: General experience of comfort will improve Outcome: Progressing

## 2019-12-06 NOTE — Progress Notes (Signed)
Unfortunately, Ms.Bayard has metastatic disease.  Her CA 27.29 is over 700.  I am sure that the bone scan will be positive.  She is on heparin right now.  Surgery will not do a breast biopsy.  They say this will have to be done as an outpatient.  If a biopsy is done as an inpatient, this will have to be done in the operating room and she has to be under general anesthesia.  Given that we have metastatic disease, I am sure that we can do the biopsy as an outpatient.  I would like to think that this tumor is going to be estrogen positive.  I think the question now is whether it is going to be HER-2 positive.  She goes for her cardioversion today.  I will give her a dose of Zometa for her bones.  I will know if she can have kyphoplasty or not.  A lot will depend on what the bone scan shows.  She cannot have an MRI because of the pacemaker.  This is quite unfortunate that she has never had a mammogram.  I am sure that this breast mass was probably there for a couple years.  I would still like to hope that since it looks like we only have bone only metastasis, that she will still have a decent outlook.  This will really depend on the prognostic panel that we run for breast cancer.  I will also send off next generation molecular markers once we get a biopsy.  Lattie Haw, MD  [facility] 6:2

## 2019-12-06 NOTE — Progress Notes (Signed)
  Echocardiogram 2D Echocardiogram has been performed.  Johny Chess 12/06/2019, 11:49 AM

## 2019-12-06 NOTE — TOC Initial Note (Signed)
Transition of Care [facility]) - Initial/Assessment Note    Patient Details  Name: [name] MRN: [id number] Date of Birth: Mar 28, 1946  [facility]) CM/SW Contact:    Bethena Roys, RN Phone Number: 12/06/2019, 2:37 PM  Clinical Narrative:  Patient presented for chest pain. Prior to arrival patient was from home with support of her daughter. Case Manager discussed with patient regarding home health registered nurse and physical therapy services. At this time patient is declining services. Case Manager did make patient aware that she can contact primary care provider if she needs services in the future. Case Manager will continue to follow for additional transitions of care needs.                   Expected Discharge Plan: Home/Self Care Barriers to Discharge: Continued Medical Work up   Patient Goals and CMS Choice Patient states their goals for this hospitalization and ongoing recovery are:: to return home   Choice offered to / list presented to : NA  Expected Discharge Plan and Services Expected Discharge Plan: Home/Self Care In-house Referral: NA Discharge Planning Services: CM Consult Post Acute Care Choice: NA Living arrangements for the past 2 months: Single Family Home                 DME Arranged: N/A           HH Agency: NA     Prior Living Arrangements/Services Living arrangements for the past 2 months: Single Family Home Lives with:: Adult Children Patient language and need for interpreter reviewed:: Yes        Need for Family Participation in Patient Care: Yes (Comment) Care giver support system in place?: Yes (comment)   Criminal Activity/Legal Involvement Pertinent to Current Situation/Hospitalization: No - Comment as needed  Activities of Daily Living   ADL Screening (condition at time of admission) Patient's cognitive ability adequate to safely complete daily activities?: Yes Is the patient deaf or have difficulty hearing?:  Yes Does the patient have difficulty seeing, even when wearing glasses/contacts?: Yes Does the patient have difficulty concentrating, remembering, or making decisions?: No Patient able to express need for assistance with ADLs?: Yes Does the patient have difficulty dressing or bathing?: No Independently performs ADLs?: No Communication: Independent Dressing (OT): Independent Grooming: Independent Feeding: Independent Bathing: Independent, Needs assistance Is this a change from baseline?: Pre-admission baseline Toileting: Independent In/Out Bed: Needs assistance Is this a change from baseline?: Pre-admission baseline Walks in Home: Independent Does the patient have difficulty walking or climbing stairs?: Yes Weakness of Legs: Right Weakness of Arms/Hands: None  Permission Sought/Granted Permission sought to share information with : Family Supports    Emotional Assessment Appearance:: Appears stated age Attitude/Demeanor/Rapport: Engaged Affect (typically observed): Appropriate Orientation: : Oriented to Situation, Oriented to  Time, Oriented to Place, Oriented to Self Alcohol / Substance Use: Not Applicable Psych Involvement: No (comment)  Admission diagnosis:  Upper back pain [M54.9] Atrial fibrillation with RVR ([place]) [I48.91] Chest pain [R07.9] Acute on chronic congestive heart failure, unspecified heart failure type [facility]) [I50.9] Patient Active Problem List   Diagnosis Date Noted  . Breast cancer metastasized to bone, unspecified laterality ([place]) 12/06/2019  . Goals of care, counseling/discussion 12/06/2019  . HTN (hypertension)   . Chest pain   . Pressure injury of skin 12/05/2016  . 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 NSTEMI (non-ST elevated myocardial infarction) ([place]) 11/27/2016  . Ventricular fibrillation ([place]) 11/27/2016  . Head injury   .  ICD (implantable cardioverter-defibrillator) discharge   . Diabetic neuropathy ([place]) 06/16/2016  . CKD (chronic kidney disease)  stage 3, GFR 30-59 ml/min 06/16/2016  . Fatigue 10/30/2015  . Paroxysmal atrial fibrillation ([place]) 10/02/2015  . Acute on chronic systolic CHF (congestive heart failure) ([place]) 10/02/2015  . Atrial flutter ([place]) 09/18/2015  . Chronic anticoagulation-Xarelto 09/18/2015  . Orthostatic hypotension   . ICD in place- St Jude   . HLD (hyperlipidemia) 05/11/2015  . Syncope 05/11/2015  . Abdominal pain 05/11/2015  . AKI (acute kidney injury) ([place])   . Nausea & vomiting   . Hypothyroidism 04/27/2015  . Chronic diastolic CHF (congestive heart failure) ([place])   . Abnormal PFT   . CAD (coronary artery disease) 04/21/2015  . Acute respiratory failure ([place])   . Ventricular tachycardia- Sept 2016- ICD 04/13/2015  . UTI (lower urinary tract infection) 03/31/2015  . Sepsis ([place]) 03/31/2015  . Atrial fibrillation with rapid ventricular response ([place]) 03/31/2015  . Systolic CHF ([place])   . Hypertrophic cardiomyopathy ([place])   . Septic bursitis of elbow 02/03/2013  . Acute blood loss anemia 01/08/2013    Class: Acute  . Diabetes mellitus type 2, noninsulin dependent ([place])   . Cellulitis 12/31/2012  . Fasting hyperglycemia 12/31/2012  . Leukocytosis 12/31/2012  . Hypotension 12/31/2012   PCP:  Maurice Small, MD Pharmacy:   [facility] [place] [address] Phone: [phone] Fax: [phone]  Readmission Risk Interventions No flowsheet data found.

## 2019-12-07 ENCOUNTER — Encounter (HOSPITAL_COMMUNITY): Payer: Self-pay | Admitting: Internal Medicine

## 2019-12-07 ENCOUNTER — Inpatient Hospital Stay (HOSPITAL_COMMUNITY): Payer: PPO

## 2019-12-07 ENCOUNTER — Other Ambulatory Visit: Payer: Self-pay | Admitting: Anatomic Pathology & Clinical Pathology

## 2019-12-07 DIAGNOSIS — I5023 Acute on chronic systolic (congestive) heart failure: Secondary | ICD-10-CM | POA: Diagnosis not present

## 2019-12-07 DIAGNOSIS — C50911 Malignant neoplasm of unspecified site of right female breast: Secondary | ICD-10-CM

## 2019-12-07 DIAGNOSIS — R0789 Other chest pain: Secondary | ICD-10-CM

## 2019-12-07 DIAGNOSIS — C7951 Secondary malignant neoplasm of bone: Secondary | ICD-10-CM | POA: Diagnosis not present

## 2019-12-07 DIAGNOSIS — I4891 Unspecified atrial fibrillation: Secondary | ICD-10-CM | POA: Diagnosis not present

## 2019-12-07 HISTORY — PX: BREAST BIOPSY: SHX20

## 2019-12-07 HISTORY — PX: IR US GUIDE BX ASP/DRAIN: IMG2392

## 2019-12-07 LAB — HEPARIN LEVEL (UNFRACTIONATED): Heparin Unfractionated: >2.2 IU/mL — ABNORMAL HIGH (ref 0.30–0.70)

## 2019-12-07 LAB — GLUCOSE, CAPILLARY
Glucose-Capillary: 172 mg/dL — ABNORMAL HIGH (ref 70–99)
Glucose-Capillary: 142 mg/dL — ABNORMAL HIGH (ref 70–99)
Glucose-Capillary: 136 mg/dL — ABNORMAL HIGH (ref 70–99)
Glucose-Capillary: 164 mg/dL — ABNORMAL HIGH (ref 70–99)

## 2019-12-07 LAB — MAGNESIUM: Magnesium: 2 mg/dL (ref 1.7–2.4)

## 2019-12-07 LAB — CBC
HCT: 36.6 % (ref 36.0–46.0)
Hemoglobin: 11.1 g/dL — ABNORMAL LOW (ref 12.0–15.0)
MCH: 27.1 pg (ref 26.0–34.0)
MCHC: 30.3 g/dL (ref 30.0–36.0)
MCV: 89.3 fL (ref 80.0–100.0)
Platelets: 337 10*3/uL (ref 150–400)
RBC: 4.1 MIL/uL (ref 3.87–5.11)
RDW: 17.4 % — ABNORMAL HIGH (ref 11.5–15.5)
WBC: 9.5 10*3/uL (ref 4.0–10.5)
nRBC: 0 % (ref 0.0–0.2)

## 2019-12-07 LAB — COMPREHENSIVE METABOLIC PANEL
ALT: 25 U/L (ref 0–44)
AST: 28 U/L (ref 15–41)
Albumin: 3.6 g/dL (ref 3.5–5.0)
Alkaline Phosphatase: 115 U/L (ref 38–126)
Anion gap: 12 (ref 5–15)
BUN: 33 mg/dL — ABNORMAL HIGH (ref 8–23)
CO2: 22 mmol/L (ref 22–32)
Calcium: 9 mg/dL (ref 8.9–10.3)
Chloride: 101 mmol/L (ref 98–111)
Creatinine, Ser: 1.95 mg/dL — ABNORMAL HIGH (ref 0.44–1.00)
GFR calc Af Amer: 29 mL/min — ABNORMAL LOW (ref >60)
GFR calc non Af Amer: 25 mL/min — ABNORMAL LOW (ref >60)
Glucose, Bld: 202 mg/dL — ABNORMAL HIGH (ref 70–99)
Potassium: 4.5 mmol/L (ref 3.5–5.1)
Sodium: 135 mmol/L (ref 135–145)
Total Bilirubin: 0.9 mg/dL (ref 0.3–1.2)
Total Protein: 6.7 g/dL (ref 6.5–8.1)

## 2019-12-07 LAB — APTT
aPTT: 114 seconds — ABNORMAL HIGH (ref 24–36)
aPTT: 34 seconds (ref 24–36)

## 2019-12-07 MED ORDER — ONDANSETRON HCL 4 MG/2ML IJ SOLN
4.0000 mg | Freq: Three times a day (TID) | INTRAMUSCULAR | Status: DC | PRN
  Administered 2019-12-07 – 2019-12-10 (×7): 4 mg via INTRAVENOUS
  Filled 2019-12-07 (×7): qty 2

## 2019-12-07 MED ORDER — ENSURE ENLIVE PO LIQD: 237.0000 mL | Freq: Two times a day (BID) | ORAL | Status: DC

## 2019-12-07 MED ORDER — LIDOCAINE HCL (PF) 1 % IJ SOLN
INTRAMUSCULAR | Status: DC | PRN
  Administered 2019-12-07: 8 mL

## 2019-12-07 MED ORDER — HYDROCODONE-ACETAMINOPHEN 5-325 MG PO TABS: 1.0000 | ORAL_TABLET | ORAL | Status: DC | PRN

## 2019-12-07 MED ORDER — FENTANYL 12 MCG/HR TD PT72
1.0000 | MEDICATED_PATCH | TRANSDERMAL | Status: DC
  Administered 2019-12-07: 1 via TRANSDERMAL
  Filled 2019-12-07: qty 1

## 2019-12-07 MED ORDER — FUROSEMIDE 10 MG/ML IJ SOLN
40.0000 mg | Freq: Once | INTRAMUSCULAR | Status: AC
  Administered 2019-12-07: 40 mg via INTRAVENOUS
  Filled 2019-12-07: qty 4

## 2019-12-07 MED ORDER — LIDOCAINE HCL 1 % IJ SOLN
INTRAMUSCULAR | Status: AC
  Filled 2019-12-07: qty 20

## 2019-12-07 NOTE — Plan of Care (Signed)
  Problem: Activity: Goal: Capacity to carry out activities will improve Outcome: Progressing   Problem: Nutrition: Goal: Adequate nutrition will be maintained Outcome: Progressing   Problem: Pain Managment: Goal: General experience of comfort will improve Outcome: Progressing

## 2019-12-07 NOTE — Progress Notes (Addendum)
I guess Ms. Malanga did not have a cardioversion yesterday.  It sounds like she was placed on amiodarone.  She still has an atrial fibrillation.  She is on heparin.  We MUST get a biopsy of this left breast mass while she is in the hospital.  She DOES NOT need to have a mammogram done.  She already has metastatic disease.  Her bone scan shows some areas of activity but really not all that active.  I suspect that a PET scan might be able to give us more detail but again this cannot be done as an inpatient.  We really need to get a biopsy so that we know how to treat her.  I would think that since she is postmenopausal that she has a tumor that is estrogen receptor positive.  Given her heart failure issues, even if she is HER-2 positive, I am not sure we can utilize Herceptin.  Her labs today show white cell count 9.5.  Hemoglobin 0.1.  Platelet count 337,000.  Her BUN is 33 creatinine 1.95.  I think this probably is going up because of contrast that she has received.  It may also be going up because of atrial fibrillation.  Her vital signs all look pretty stable.  Her temperature is 98.4.  Pulse 83.  Blood pressure 115/69.  There really is no change in her physical examination.  Again, I will hope that she will have a biopsy done in the hospital.  It will be somewhat easier to have the biopsy done while she is on heparin.  Again, she does not need to have a mammogram done.  I will go ahead and try her on a Duragesic patch for the back pain.  I am not sure this could be treated with kyphoplasty.  I very much appreciate all the great care that she is getting from the staff up on 6 [address].  Pete , MD  Romans 10:13  

## 2019-12-07 NOTE — Procedures (Signed)
  Procedure: US guided L breast mass core biopsy   EBL:   minimal Complications:  none immediate  See full dictation in [facility].  Dillard Cannon MD Main # [phone] Pager  [phone]

## 2019-12-07 NOTE — Progress Notes (Signed)
Occupational Therapy Evaluation Patient Details Name: [name] MRN: [id number] DOB: 09-Mar-1946 Today's Date: 12/07/2019    History of Present Illness [name] is a 74 y.o. female with medical history significant of paroxysmal A. fib on Eliquis, combined systolic and diastolic CHF, ventricular tachycardia status post ICD placed in 2013, coronary artery disease status post CABG, hypertension, hyperlipidemia, hypertrophic cardiomyopathy presents to emergency department due to worsening midsternal chest pain and upper back pain since 1 month. Pt found to have left sided breast mass as well as compression fracture of thoracic vertebrae and acute C7 transverse process. Per radiology "NM bone scan 12/06/2019 reviewed by Dr. Estanislado Pandy who states no fractures are acute in nature- all appear to be healed and chronic. Per Dr. Estanislado Pandy, due to lack of acuity of fractures, patient is not a candidate for vertebral augmentation at this time. No plans for IR interventions at this time- will delete order."   Clinical Impression   PTA pt lived with daughter, independent in all ADL, IADL, and mobility tasks. Pt ambulates with a RW and reports 0 falls in the last 6 months. Pt does not drive. Pt currently independent to min assist for self-care and functional transfer tasks. Pt tolerated sitting EOB for entire session, noting 0 instances of LOB. Educated pt on spinal precautions with fair understanding and follow through. Pt able to complete figure four position with BLEs. Pt transferred to/from [facility] with RW and min guard. 0 instances of LOB, however pt unsteady on feet. Pt engaged in simple grooming/hygiene tasks while seated EOB, unable to stand at the sink due to pain and nausea. RN aware. Pt demonstrates decreased strength, endurance, balance, standing tolerance, and activity tolerance impacting ability to complete self-care and functional transfer tasks. Recommend skilled OT services to address above deficits  in order to promote function and prevent further decline. Recommend [facility] for continued rehab following hospital discharge.     Follow Up Recommendations  Home health OT;Supervision - Intermittent    Equipment Recommendations  3 in 1 bedside commode    Recommendations for Other Services       Precautions / Restrictions Precautions Precautions: Fall;Back Precaution Booklet Issued: No Restrictions Weight Bearing Restrictions: No      Mobility Bed Mobility               General bed mobility comments: Pt sitting EOB upon OT arrival.   Transfers Overall transfer level: Needs assistance Equipment used: Rolling walker (2 wheeled) Transfers: Sit to/from [facility] Sit to Stand: Min guard Stand pivot transfers: Min guard       General transfer comment: To ensure balance and safety    Balance Overall balance assessment: Needs assistance Sitting-balance support: Feet supported Sitting balance-Leahy Scale: Good       Standing balance-Leahy Scale: Fair                             ADL either performed or assessed with clinical judgement   ADL Overall ADL's : Needs assistance/impaired Eating/Feeding: Independent;Sitting   Grooming: Wash/dry hands;Wash/dry face;Oral care;Set up;Supervision/safety;Sitting Grooming Details (indicate cue type and reason): While seated EOB Upper Body Bathing: Supervision/ safety;Set up;Sitting   Lower Body Bathing: Minimal assistance;Sit to/from stand;Sitting/lateral leans   Upper Body Dressing : Sitting;Minimal assistance   Lower Body Dressing: Minimal assistance;Sitting/lateral leans;Sit to/from stand   Toilet Transfer: Min guard;BSC   Toileting- Water quality scientist and Hygiene: Min guard;Sitting/lateral lean;Sit to/from stand  Functional mobility during ADLs: Min guard;Rolling walker General ADL Comments: Pt tolerated sitting EOB for entire session, noting 0 instances of LOB. Pt transferred  to[facility] with RW and min guard.      Vision Baseline Vision/History: Wears glasses Wears Glasses: Reading only       Perception     Praxis      Pertinent Vitals/Pain Pain Assessment: 0-10 Pain Score: 7  Pain Location: upper back Pain Descriptors / Indicators: Shooting;Discomfort Pain Intervention(s): Limited activity within patient's tolerance;Monitored during session;Repositioned;Premedicated before session     Hand Dominance Right   Extremity/Trunk Assessment Upper Extremity Assessment Upper Extremity Assessment: Generalized weakness(Pain impacting function)   Lower Extremity Assessment Lower Extremity Assessment: Defer to PT evaluation       Communication Communication Communication: No difficulties   Cognition Arousal/Alertness: Awake/alert Behavior During Therapy: WFL for tasks assessed/performed Overall Cognitive Status: Within Functional Limits for tasks assessed                                 General Comments: A&O x 4. Pt able to follow multi-step instructions without difficulty.    General Comments  Pain limiting performance this date. Pt nauseous throughout session.    Exercises     Shoulder Instructions      Home Living Family/patient expects to be discharged to:: Private residence Living Arrangements: Children(Daughter) Available Help at Discharge: Family;Available 24 hours/day Type of Home: House Home Access: Stairs to enter [employer] of Steps: 1   Home Layout: Two level;Able to live on main level with bedroom/bathroom Alternate Level Stairs-Number of Steps: flight   Bathroom Shower/Tub: Walk-in shower;Tub/shower unit(primarily uses walk-in shower)   Bathroom Toilet: Standard     Home Equipment: Environmental consultant - 2 wheels;Cane - single point;Shower seat          Prior Functioning/Environment Level of Independence: Independent with assistive device(s)        Comments: Pt ambulates with a RW majority of time  and reports 0 falls in the last 6 months. Pt independent in all ADL and IADL tasks. Pt does not drive.         OT Problem List: Decreased strength;Decreased activity tolerance;Impaired balance (sitting and/or standing);Pain      OT Treatment/Interventions: Self-care/ADL training;Therapeutic exercise;Neuromuscular education;Energy conservation;DME and/or AE instruction;Therapeutic activities;Patient/family education;Balance training    OT Goals(Current goals can be found in the care plan section) Acute Rehab OT Goals Patient Stated Goal: to decrease pain, return home Time For Goal Achievement: 12/21/19 Potential to Achieve Goals: Good ADL Goals Pt Will Perform Grooming: with modified independence;standing Pt Will Perform Lower Body Bathing: with modified independence;sit to/from stand Pt Will Perform Lower Body Dressing: with modified independence;sit to/from stand Pt Will Transfer to Toilet: with modified independence;ambulating;regular height toilet Pt Will Perform Toileting - Clothing Manipulation and hygiene: with modified independence;sit to/from stand Pt Will Perform Tub/Shower Transfer: Shower transfer;with supervision;shower seat Additional ADL Goal #1: Pt to recall and verbalize 3 energy conservation strategies with 0 verbal cues. Additional ADL Goal #2: Pt to tolerate standing up to 5 min with modified independence, in preparation for ADLs.  OT Frequency: Min 2X/week   Barriers to D/C:            Co-evaluation              AM-PAC OT "6 Clicks" Daily Activity     Outcome Measure Help from another person eating meals?: None Help from another  person taking care of personal grooming?: A Little Help from another person toileting, which includes using toliet, bedpan, or urinal?: A Little Help from another person bathing (including washing, rinsing, drying)?: A Little Help from another person to put on and taking off regular upper body clothing?: A Little Help from  another person to put on and taking off regular lower body clothing?: A Little 6 Click Score: 19   End of Session Equipment Utilized During Treatment: Rolling walker Nurse Communication: Mobility status  Activity Tolerance: Patient limited by pain Patient left: in bed;with call bell/phone within reach  OT Visit Diagnosis: Unsteadiness on feet (R26.81);Muscle weakness (generalized) (M62.81);Pain Pain - Right/Left: (posterior) Pain - part of body: (back)                Time: [id number] OT Time Calculation (min): 24 min Charges:  OT General Charges $OT Visit: 1 Visit OT Evaluation $OT Eval Moderate Complexity: 1 Mod OT Treatments $Self Care/Home Management : 8-22 mins  Mauri Brooklyn OTR/L [phone]  Mauri Brooklyn 12/07/2019, 10:23 AM

## 2019-12-07 NOTE — Progress Notes (Signed)
NIR consulted by Dr. Reesa Chew for management of multiple compression fractures (C7, T3, T4, T5).  NM bone scan 12/06/2019 reviewed by Dr. Estanislado Pandy who states no fractures are acute in nature- all appear to be healed and chronic. Per Dr. Estanislado Pandy, due to lack of acuity of fractures, patient is not a candidate for vertebral augmentation at this time. No plans for IR interventions at this time- will delete order. Will send message to Dr. Avon Gully to make aware.  NIR available in future if needed.   Bea Graff Briton Sellman, PA-C 12/07/2019, 8:34 AM

## 2019-12-07 NOTE — Progress Notes (Addendum)
PROGRESS NOTE    [name]  [id number] DOB: 14-May-1946 DOA: 12/04/2019 PCP: Maurice Small, MD   Brief Narrative:  74 year old with history of paroxysmal A. fib on Eliquis, systolic CHF status post CABG, ventricular tachycardia status post ICD placement 1113, HTN, hypertrophic cardiomyopathy, HLD who presented with chest pain and back pain ongoing for 1 month.  In ED noted to be in atrial fibrillation with RVR, CHF exacerbation.  Cardiology and hospitalist team consulted for admission and further care.  Diagnosed with left breast mass with concerns for possible metastases.  Difficulty getting patient formal diagnosis.  Oncology working to see if something can be done inpatient.  Bone scan completed 12/06/2019 and potential biopsy pending.   Assessment & Plan:   Principal Problem:   Chest pain Active Problems:   Diabetes mellitus type 2, noninsulin dependent ([name])   Atrial fibrillation with RVR ([name])   Systolic CHF ([name])   CAD (coronary artery disease)   HLD (hyperlipidemia)   Acute on chronic congestive heart failure ([name])   Paroxysmal atrial fibrillation ([name])   CKD (chronic kidney disease) stage 3, GFR 30-59 ml/min   S/P CABG x 5   HTN (hypertension)   Breast cancer metastasized to bone, left ([name])   Goals of care, counseling/discussion   Acute hypoxic respiratory failure in the setting of acutely decompensated congestive heart failure with reduced ejection fraction, 45%, class III Hypertrophic cardiomyopathy CAD status post CABG - Diuretics held due to acute kidney injury as below; cardiology following - Strict input and output, daily weight - minimal weight gain as per recordings: - Ambulatory oxygen screening daily  Intake/Output Summary (Last 24 hours) at 12/07/2019 1037 Last data filed at 12/06/2019 1500 Gross per 24 hour  Intake 184.34 ml  Output --  Net 184.34 ml   Atypical chest pain and upper back pain in the setting of acute C7 fracture - This is likely  referred pain from her thoracic spine fracture, not cardiogenic in nature. - Concurrent left breast mass, cardiomegaly with pleural effusion/interstitial edema  Compression fracture of thoracic vertebrae and acute C7 transverse process - IR consulted, bone scan has been completed - Fractures appear to be chronic in nature, not candidate for vertebral augmentation per IR - Zometa ordered by Onc - For C7 transverse spine fracture patient can just use soft collar 4-6 weeks and pain control- Curbside neurosurgery.  Left-sided breast mass, upper quadrant - Concerning for breast cancer with metastatic disease given family history/imaging.   - Oncology team working on getting biopsy of mass while inpatient; she does not require mammogram first per oncology given metastatic designation  Acute kidney injury on CKD stage II, worsening in the setting of above - Baseline creatinine 0.9 - likely secondary to diuresis and contrast - Continue to follow creatinine, if continues to worsen will attempt fluid trial Lab Results  Component Value Date   CREATININE 1.95 (H) 12/07/2019   CREATININE 1.66 (H) 12/06/2019   CREATININE 0.98 12/05/2019   Atrial fibrillation with RVR, rate controlled - Continue heparin - likely transition back to eliquis after biopsy - Cardiology would like cardioversion with 3 to 4 weeks of anticoagulation but currently on hold until we obtain tissue diagnosis. -Amiodarone drip continues, defer to cardiology to transition back to p.o. rate control   Vitamin D deficiency -Vitamin D supplements 50,000 units every 7 days.  History of ventricular tachycardia status post ICD placement -Continues on telemetry, monitor closely  Diabetes mellitus type 2 -Sliding scale and Accu-Chek, hemoglobin A1c  7.7  Hyperlipidemia - Continue statin, LDL at goal-61  History of anxiety/depression - Continue home meds  Unintentional weight loss -TSH- Neg -HIV- pending  DVT prophylaxis:  Heparin drip Code Status: Full code Family Communication: None  Status is: Inpatient  Remains inpatient appropriate because:Ongoing diagnostic testing needed not appropriate for outpatient work up   Dispo: The patient is from: Home              Anticipated d/c is to: Home              Anticipated d/c date is: 2 days              Patient currently undergoing work-up for breast mass and multiple compression fracture complicated by A. fib with RVR and CHF exacerbation.   Subjective: Patient is sitting up in the bed quite emotional regarding her potential diagnosis and disposition plan.  Admits to ongoing back pain but declines nausea, vomiting, diarrhea, constipation, headache, fevers, chills.  Examination: Constitutional: Not in acute distress, 2 L nasal cannula Respiratory: Bibasilar crackles Cardiovascular: Irregularly irregular Abdomen: Nontender nondistended good bowel sounds Musculoskeletal: No edema noted Skin: No rashes seen Neurologic: CN 2-12 grossly intact.  And nonfocal Psychiatric: Overall anxious otherwise alert awake oriented.  Objective: Vitals:   12/06/19 1639 12/06/19 2100 12/07/19 0030 12/07/19 0430  BP: (!) 129/97 (!) 127/92 114/64 115/69  Pulse: 74 73 80 83  Resp:  18 19 19   Temp: 97.8 F (36.6 C) 97.9 F (36.6 C) 97.6 F (36.4 C) 98.4 F (36.9 C)  TempSrc: Oral Oral Oral Oral  SpO2: 97% 94% 100% 93%  Weight:    81.9 kg  Height:        Intake/Output Summary (Last 24 hours) at 12/07/2019 [id number] Last data filed at 12/06/2019 1500 Gross per 24 hour  Intake 184.34 ml  Output --  Net 184.34 ml   Filed Weights   12/05/19 0447 12/06/19 0400 12/07/19 0430  Weight: 79.6 kg 79.9 kg 81.9 kg     Data Reviewed:   CBC: Recent Labs  Lab 12/04/19 1324 12/05/19 0442 12/06/19 0402 12/07/19 0512  WBC 8.6 8.3 7.8 9.5  HGB 11.2* 11.1* 11.0* 11.1*  HCT 37.6 37.1 36.8 36.6  MCV 88.7 88.5 88.7 89.3  PLT 310 328 340 [id number]   Basic Metabolic Panel: Recent Labs   Lab 12/04/19 1324 12/04/19 1637 12/05/19 0442 12/06/19 0402 12/07/19 0512  NA 138  --  142 138 135  K 3.5  --  3.8 3.6 4.5  CL 106  --  108 103 101  CO2 20*  --  20* 23 22  GLUCOSE 187*  --  134* 141* 202*  BUN 20  --  18 23 33*  CREATININE 1.06*  --  0.98 1.66* 1.95*  CALCIUM 9.0  --  9.1 9.0 9.0  MG  --  1.7  --  1.7 2.0  PHOS  --  4.4  --   --   --    GFR: Estimated Creatinine Clearance: 27.4 mL/min (A) (by C-G formula based on SCr of 1.95 mg/dL (H)). Liver Function Tests: Recent Labs  Lab 12/05/19 0442 12/06/19 0402 12/07/19 0512  AST 33 28 28  ALT 28 25 25   ALKPHOS 103 101 115  BILITOT 1.2 1.1 0.9  PROT 6.7 6.5 6.7  ALBUMIN 3.3* 3.3* 3.6   No results for input(s): LIPASE, AMYLASE in the last 168 hours. No results for input(s): AMMONIA in the last 168 hours. Coagulation Profile: No results  for input(s): INR, PROTIME in the last 168 hours. Cardiac Enzymes: No results for input(s): CKTOTAL, CKMB, CKMBINDEX, TROPONINI in the last 168 hours. BNP (last 3 results) No results for input(s): PROBNP in the last 8760 hours. HbA1C: Recent Labs    12/04/19 1637  HGBA1C 7.7*   CBG: Recent Labs  Lab 12/05/19 2158 12/06/19 0810 12/06/19 1108 12/06/19 1635 12/06/19 2138  GLUCAP 163* 109* 126* 151* 142*   Lipid Profile: Recent Labs    12/04/19 1637 12/05/19 0442  CHOL 105 104  HDL 27* 25*  LDLCALC 59 61  TRIG 93 90  CHOLHDL 3.9 4.2   Thyroid Function Tests: Recent Labs    12/04/19 1637  TSH 3.815   Anemia Panel: No results for input(s): VITAMINB12, FOLATE, FERRITIN, TIBC, IRON, RETICCTPCT in the last 72 hours. Sepsis Labs: No results for input(s): PROCALCITON, LATICACIDVEN in the last 168 hours.  Recent Results (from the past 240 hour(s))  Respiratory Panel by RT PCR (Flu A&B, Covid) - Nasopharyngeal Swab     Status: None   Collection Time: 12/04/19  2:06 PM   Specimen: Nasopharyngeal Swab  Result Value Ref Range Status   SARS Coronavirus 2 by RT  PCR NEGATIVE NEGATIVE Final    Comment: (NOTE) SARS-CoV-2 target nucleic acids are NOT DETECTED. The SARS-CoV-2 RNA is generally detectable in upper respiratoy specimens during the acute phase of infection. The lowest concentration of SARS-CoV-2 viral copies this assay can detect is 131 copies/mL. A negative result does not preclude SARS-Cov-2 infection and should not be used as the sole basis for treatment or other patient management decisions. A negative result may occur with  improper specimen collection/handling, submission of specimen other than nasopharyngeal swab, presence of viral mutation(s) within the areas targeted by this assay, and inadequate number of viral copies (<131 copies/mL). A negative result must be combined with clinical observations, patient history, and epidemiological information. The expected result is Negative. Fact Sheet for Patients:  PinkCheek.be Fact Sheet for Healthcare Providers:  GravelBags.it This test is not yet ap proved or cleared by the [place] FDA and  has been authorized for detection and/or diagnosis of SARS-CoV-2 by FDA under an Emergency Use Authorization (EUA). This EUA will remain  in effect (meaning this test can be used) for the duration of the COVID-19 declaration under Section 564(b)(1) of the Act, 21 U.S.C. section 360bbb-3(b)(1), unless the authorization is terminated or revoked sooner.    Influenza A by PCR NEGATIVE NEGATIVE Final   Influenza B by PCR NEGATIVE NEGATIVE Final    Comment: (NOTE) The Xpert Xpress SARS-CoV-2/FLU/RSV assay is intended as an aid in  the diagnosis of influenza from Nasopharyngeal swab specimens and  should not be used as a sole basis for treatment. Nasal washings and  aspirates are unacceptable for Xpert Xpress SARS-CoV-2/FLU/RSV  testing. Fact Sheet for Patients: PinkCheek.be Fact Sheet for Healthcare  Providers: GravelBags.it This test is not yet approved or cleared by the [place] FDA and  has been authorized for detection and/or diagnosis of SARS-CoV-2 by  FDA under an Emergency Use Authorization (EUA). This EUA will remain  in effect (meaning this test can be used) for the duration of the  Covid-19 declaration under Section 564(b)(1) of the Act, 21  U.S.C. section 360bbb-3(b)(1), unless the authorization is  terminated or revoked. Performed at [facility], [place] [address] [place], [place] [address]          Radiology Studies: NM Bone Scan Whole Body  Result  Date: 12/06/2019 CLINICAL DATA:  T-spine compression fracture. EXAM: NUCLEAR MEDICINE WHOLE BODY BONE SCAN TECHNIQUE: Whole body anterior and posterior images were obtained approximately 3 hours after intravenous injection of radiopharmaceutical. RADIOPHARMACEUTICALS:  21.4 mCi Technetium-26m MDP IV COMPARISON:  CT 12/04/2019. FINDINGS: Bilateral renal function excretion. Punctate focus of increased activity noted over the right frontal skull. Punctate area of increased activity noted over a mid right anterior mid rib and possibly mid left anterior rib. Subtle punctate areas of increased activity over the lower cervical/upper thoracic cannot be excluded. Punctate area of increased activity noted over the upper lumbar spine. The possibility of metastatic disease presenting in this fashion cannot be excluded. Increased activity noted over the medial right clavicle and manubrium. This may be degenerative. Mild increased activity noted about the medial right knee joint space most likely degenerative. IMPRESSION: 1. Punctate areas of increased activity noted over the right frontal skull, mid right anterior rib and possibly mid left anterior rib. Subtle punctate areas of increased activity over the lower cervical/upper thoracic spine cannot be excluded. Punctate area of increased activity noted over the  upper lumbar spine. The possibility metastatic disease presenting in this fashion cannot be excluded. 2. Increased activity noted over the medial right clavicle and manubrium. These changes may be degenerative. Punctate area of increased activity noted over the medial right knee joint space most likely degenerative. Plain film images of the above described areas may prove useful for further evaluation. Electronically Signed   By: Marcello Moores  Register   On: 12/06/2019 14:06   ECHOCARDIOGRAM COMPLETE  Result Date: 12/06/2019    ECHOCARDIOGRAM REPORT   Patient Name:   [name] Date of Exam: 12/06/2019 Medical Rec #:  [id number]        Height:       64.0 in Accession #:    [id number]       Weight:       176.1 lb Date of Birth:  January 31, 1946        BSA:          1.853 m Patient Age:    41 years         BP:           100/68 mmHg Patient Gender: F                HR:           90 bpm. Exam Location:  Inpatient Procedure: 2D Echo Indications:    acute systolic chf [id number]  History:        Patient has prior history of Echocardiogram examinations, most                 recent 06/22/2019. Prior CABG and Defibrillator, chronic kidney                 disease, Arrythmias:Atrial Fibrillation, Signs/Symptoms:Chest                 Pain; Risk Factors:Hypertension, Dyslipidemia and Diabetes.  Sonographer:    Johny Chess Referring Phys: [place]  1. Left ventricular ejection fraction, by estimation, is 35 to 40%. The left ventricle has moderately decreased function. The left ventricle demonstrates regional wall motion abnormalities (see scoring diagram/findings for description). There is severe asymmetric left ventricular hypertrophy. Left ventricular diastolic function could not be evaluated.  2. Right ventricular systolic function is low normal. The right ventricular size is normal. There is severely elevated pulmonary artery systolic pressure.  3. Left atrial  size was severely dilated.  4. Right  atrial size was mildly dilated.  5. The mitral valve is degenerative. Mild mitral valve regurgitation. No evidence of mitral stenosis.  6. Tricuspid valve regurgitation is moderate.  7. The aortic valve is bicuspid. Aortic valve regurgitation is not visualized. Mild aortic valve sclerosis is present, with no evidence of aortic valve stenosis.  8. The inferior vena cava is dilated in size with <50% respiratory variability, suggesting right atrial pressure of 15 mmHg. Conclusion(s)/Recommendation(s): EF has decreased since prior echo. FINDINGS  Left Ventricle: Left ventricular ejection fraction, by estimation, is 35 to 40%. The left ventricle has moderately decreased function. The left ventricle demonstrates regional wall motion abnormalities. The left ventricular internal cavity size was normal in size. There is severe asymmetric left ventricular hypertrophy. Left ventricular diastolic function could not be evaluated due to atrial fibrillation. Left ventricular diastolic function could not be evaluated. Right Ventricle: The right ventricular size is normal. No increase in right ventricular wall thickness. Right ventricular systolic function is low normal. There is severely elevated pulmonary artery systolic pressure. The tricuspid regurgitant velocity is 3.75 m/s, and with an assumed right atrial pressure of 15 mmHg, the estimated right ventricular systolic pressure is [id number] mmHg. Left Atrium: Left atrial size was severely dilated. Right Atrium: Right atrial size was mildly dilated. Pericardium: There is no evidence of pericardial effusion. Mitral Valve: The mitral valve is degenerative in appearance. There is moderate thickening of the mitral valve leaflet(s). There is moderate calcification of the mitral valve leaflet(s). Severe mitral annular calcification. Mild mitral valve regurgitation. No evidence of mitral valve stenosis. Tricuspid Valve: The tricuspid valve is normal in structure. Tricuspid valve  regurgitation is moderate . No evidence of tricuspid stenosis. Aortic Valve: The aortic valve is bicuspid. . There is mild thickening and moderate calcification of the aortic valve. Aortic valve regurgitation is not visualized. Mild aortic valve sclerosis is present, with no evidence of aortic valve stenosis. There is mild thickening of the aortic valve. There is moderate calcification of the aortic valve. Pulmonic Valve: The pulmonic valve was grossly normal. Pulmonic valve regurgitation is mild. No evidence of pulmonic stenosis. Aorta: The aortic root, ascending aorta and aortic arch are all structurally normal, with no evidence of dilitation or obstruction. Venous: The inferior vena cava is dilated in size with less than 50% respiratory variability, suggesting right atrial pressure of 15 mmHg. IAS/Shunts: No atrial level shunt detected by color flow Doppler. Additional Comments: A pacer wire is visualized in the right atrium and right ventricle.  LEFT VENTRICLE PLAX 2D LVIDd:         4.80 cm LVIDs:         3.90 cm LV PW:         1.10 cm LV IVS:        1.85 cm LVOT diam:     1.90 cm LV SV:         40 LV SV Index:   22 LVOT Area:     2.84 cm  LEFT ATRIUM              Index       RIGHT ATRIUM           Index LA diam:        4.50 cm  2.43 cm/m  RA Area:     19.10 cm LA Vol (A2C):   96.2 ml  51.91 ml/m RA Volume:   49.50 ml  26.71 ml/m LA Vol (A4C):  94.1 ml  50.77 ml/m LA Biplane Vol: 101.0 ml 54.50 ml/m  AORTIC VALVE LVOT Vmax:   83.50 cm/s LVOT Vmean:  51.200 cm/s LVOT VTI:    0.141 m TRICUSPID VALVE TR Peak grad:   56.2 mmHg TR Vmax:        375.00 cm/s  SHUNTS Systemic VTI:  0.14 m Systemic Diam: 1.90 cm Buford Dresser MD Electronically signed by Buford Dresser MD Signature Date/Time: 12/06/2019/4:48:28 PM    Final         Scheduled Meds: . fentaNYL  1 patch Transdermal Q72H  . insulin aspart  0-15 Units Subcutaneous TID WC  . insulin aspart  0-5 Units Subcutaneous QHS  . metoprolol  succinate  25 mg Oral Daily  . Vitamin D (Ergocalciferol)  50,000 Units Oral Q7 days   Continuous Infusions: . amiodarone 30 mg/hr (12/06/19 1658)  . heparin 1,000 Units/hr (12/05/19 2125)     LOS: 3 days   Time spent= 35 mins  Little Ishikawa, DO [facility]  If 7PM-7AM, please contact night-coverage  12/07/2019, 7:12 AM

## 2019-12-07 NOTE — Progress Notes (Signed)
PT Cancellation Note  Patient Details Name: JANIT LATTIN MRN: [id number] DOB: Apr 24, 1946   Cancelled Treatment:    Reason Eval/Treat Not Completed: (P) Patient at procedure or test/unavailable Pt is off floor for procedure. PT will follow back tomorrow.  Raffaele Derise B. Migdalia Dk PT, DPT Acute Rehabilitation Services Pager [phone] Office [phone]  [place] 12/07/2019, 5:05 PM

## 2019-12-07 NOTE — Progress Notes (Addendum)
Advanced Heart Failure Rounding Note  PCP-Cardiologist: No primary care provider on file.   Subjective:    Lasix held yesterday given AKI. Wt now up an additional 4 lb. Her device interrogation done 5/3 showed thoracic impedence had been down below reference curve since ~5/1.  SCr even higher today, 0.98>>1.66>>1.95 (susect possible contrast nephropathy, after chest CT w/ contrast on 5/2).    Remains in Afib, V-rates in the 90s. Refuses metoprolol (had side effects in the past). She is on IV amiodarone but reports new development of nausea since ~3am today. No vomiting. Thinks it's the amio.   Oncology following for new diagnosis of metastatic breast cancer. CA 27.29 is over 700. Vertebral fractures likely pathologic. Oncology trying to get tissue biopsy done while inpatient to help guide further therapy. She is off Eliquis and on IV heparin for possible procedures.   Echo repeated. EF is lower compared to prior exam. Now 35-40% (previously 45-50%). RV normal. RA pressure 15. RVSP severely elevated at 61mmHg.     2D Echo 12/06/19 1. Left ventricular ejection fraction, by estimation, is 35 to 40%. The left ventricle has moderately decreased function. The left ventricle demonstrates regional wall motion abnormalities (see scoring diagram/findings for description). There is severe asymmetric left ventricular hypertrophy. Left ventricular diastolic function could not be evaluated. 2. Right ventricular systolic function is low normal. The right ventricular size is normal. There is severely elevated pulmonary artery systolic pressure. 3. Left atrial size was severely dilated. 4. Right atrial size was mildly dilated. 5. The mitral valve is degenerative. Mild mitral valve regurgitation. No evidence of mitral stenosis. 6. Tricuspid valve regurgitation is moderate. 7. The aortic valve is bicuspid. Aortic valve regurgitation is not visualized. Mild aortic valve sclerosis is present, with no  evidence of aortic valve stenosis. 8. The inferior vena cava is dilated in size with <50% respiratory variability, suggesting right atrial pressure of 15 mmHg. Conclusion(s)/Recommendation(s): EF has decreased since prior echo.   Objective:   Weight Range: 81.9 kg Body mass index is 30.98 kg/m.   Vital Signs:   Temp:  [97.6 F (36.4 C)-98.4 F (36.9 C)] 98 F (36.7 C) (05/05 1100) Pulse Rate:  [73-91] 91 (05/05 1100) Resp:  [17-19] 17 (05/05 1100) BP: (114-129)/(51-97) 120/73 (05/05 1100) SpO2:  [93 %-100 %] 96 % (05/05 1100) Weight:  [81.9 kg] 81.9 kg (05/05 0430)    Weight change: Filed Weights   12/05/19 0447 12/06/19 0400 12/07/19 0430  Weight: 79.6 kg 79.9 kg 81.9 kg    Intake/Output:   Intake/Output Summary (Last 24 hours) at 12/07/2019 1210 Last data filed at 12/07/2019 0800 Gross per 24 hour  Intake 334.34 ml  Output --  Net 334.34 ml      Physical Exam    General:  Fatigue appearing and looks nauseate No resp difficulty HEENT: Normal Neck: Supple. JVP elevated to ear . Carotids 2+ bilat; no bruits. No lymphadenopathy or thyromegaly appreciated. Cor: PMI nondisplaced. Irregularly irregular rhythm, regular rate. No rubs, gallops or murmurs. Lungs: Clear, no wheezing  Abdomen: Soft, nontender, nondistended. No hepatosplenomegaly. No bruits or masses. Good bowel sounds. Extremities: No cyanosis, clubbing, rash, edema Neuro: Alert & orientedx3, cranial nerves grossly intact. moves all 4 extremities w/o difficulty. Affect pleasant   Telemetry   Persistent atrial fibrillation, 90s   EKG    No new EKG to review today. EKG from 5/2 reviewed QT/QTc 310/429 ms  Labs    CBC Recent Labs    12/06/19 0402 12/07/19  0512  WBC 7.8 9.5  HGB 11.0* 11.1*  HCT 36.8 36.6  MCV 88.7 89.3  PLT 340 [id number]   Basic Metabolic Panel Recent Labs    12/04/19 1637 12/05/19 0442 12/06/19 0402 12/07/19 0512  NA  --    < > 138 135  K  --    < > 3.6 4.5  CL  --    < >  103 101  CO2  --    < > 23 22  GLUCOSE  --    < > 141* 202*  BUN  --    < > 23 33*  CREATININE  --    < > 1.66* 1.95*  CALCIUM  --    < > 9.0 9.0  MG 1.7  --  1.7 2.0  PHOS 4.4  --   --   --    < > = values in this interval not displayed.   Liver Function Tests Recent Labs    12/06/19 0402 12/07/19 0512  AST 28 28  ALT 25 25  ALKPHOS 101 115  BILITOT 1.1 0.9  PROT 6.5 6.7  ALBUMIN 3.3* 3.6   No results for input(s): LIPASE, AMYLASE in the last 72 hours. Cardiac Enzymes No results for input(s): CKTOTAL, CKMB, CKMBINDEX, TROPONINI in the last 72 hours.  BNP: BNP (last 3 results) Recent Labs    12/04/19 1324  BNP 581.1*    ProBNP (last 3 results) No results for input(s): PROBNP in the last 8760 hours.   D-Dimer No results for input(s): DDIMER in the last 72 hours. Hemoglobin A1C Recent Labs    12/04/19 1637  HGBA1C 7.7*   Fasting Lipid Panel Recent Labs    12/05/19 0442  CHOL 104  HDL 25*  LDLCALC 61  TRIG 90  CHOLHDL 4.2   Thyroid Function Tests Recent Labs    12/04/19 1637  TSH 3.815    Other results:   Imaging    NM Bone Scan Whole Body  Result Date: 12/06/2019 CLINICAL DATA:  T-spine compression fracture. EXAM: NUCLEAR MEDICINE WHOLE BODY BONE SCAN TECHNIQUE: Whole body anterior and posterior images were obtained approximately 3 hours after intravenous injection of radiopharmaceutical. RADIOPHARMACEUTICALS:  21.4 mCi Technetium-36m MDP IV COMPARISON:  CT 12/04/2019. FINDINGS: Bilateral renal function excretion. Punctate focus of increased activity noted over the right frontal skull. Punctate area of increased activity noted over a mid right anterior mid rib and possibly mid left anterior rib. Subtle punctate areas of increased activity over the lower cervical/upper thoracic cannot be excluded. Punctate area of increased activity noted over the upper lumbar spine. The possibility of metastatic disease presenting in this fashion cannot be excluded.  Increased activity noted over the medial right clavicle and manubrium. This may be degenerative. Mild increased activity noted about the medial right knee joint space most likely degenerative. IMPRESSION: 1. Punctate areas of increased activity noted over the right frontal skull, mid right anterior rib and possibly mid left anterior rib. Subtle punctate areas of increased activity over the lower cervical/upper thoracic spine cannot be excluded. Punctate area of increased activity noted over the upper lumbar spine. The possibility metastatic disease presenting in this fashion cannot be excluded. 2. Increased activity noted over the medial right clavicle and manubrium. These changes may be degenerative. Punctate area of increased activity noted over the medial right knee joint space most likely degenerative. Plain film images of the above described areas may prove useful for further evaluation. Electronically Signed   By: Marcello Moores  Register   On: 12/06/2019 14:06     Medications:     Scheduled Medications: . fentaNYL  1 patch Transdermal Q72H  . insulin aspart  0-15 Units Subcutaneous TID WC  . insulin aspart  0-5 Units Subcutaneous QHS  . metoprolol succinate  25 mg Oral Daily  . Vitamin D (Ergocalciferol)  50,000 Units Oral Q7 days    Infusions: . amiodarone 30 mg/hr (12/07/19 1120)  . heparin 850 Units/hr (12/07/19 0809)    PRN Medications: acetaminophen **OR** acetaminophen, morphine injection, oxyCODONE, polyethylene glycol, senna-docusate    Assessment/Plan   1. 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 Prolonged post-hospital course with low output HF despite relatively preserved EF. RHC suggestive of primarily RV failure. -EF 45-50% post CABG 5/18 - Technetium pyrophosphate scan negative for amyloid. - Most recent echo 06/2019 showed mildly reduced LVEF 45-50% w/ Severe asymetric septal hypertrophy no SAM or LVOT gradient. RV ok    - Now admitted w/ NYHA III symptoms + mild volume overload. CXR w/ interstitial edema + right sided pleural effusion, BNP 581. TSH normal. Device interrogation 5/2 showed CorVue c/w mild volume overload, in the setting of persistent Afib (since 10/07/19). Started on IV lasix for diuresis  - IV Lasix held yesterday given AKI. Wt now up an additional 4lb. Looks volume up on my exam. JVP elevated - Despite bump in SCr to 1.9, will need to give IV Lasix. Suspect SCr bump is more 2/2 contrast nephropathy and not over diuresis as initially suspected   - repeat 2D echo shows moderately reduced LVEF now 35-40% (previoulsy 45-50). RV ok but elevated RVSP, 71 mmHg. RA pressure 15.  - She will likely need to start chemo for metastatic breast CA. Depending on therapies selected, may need surveillance echos q 3-6 months  - Hold off on  ARB/ARNi retrial + spiro given AKI. Can plan retrial once SCr improves/stabilizes. Can use Lokelma or Veltassa if limited by hyperkalemia (was on losartan previously, discontinued 12/20 for hyperkalemia) - She is refusing  blocker due to side effects w/ metoprolol previously  - unable to start digoxin currently due to AKI    2. Atrial Fibrillation  - prior h/o Afib/ Flutter w/ previous cardioversions + MAZE and LAA clip in 2018 at time of CABG. On chronic a/c w/ Eliquis PTA (no missed doses in the last 30 days) - now back in AFib. Device interrogation shows that she has been in persistent afib since 10/07/19 - TSH normal  - recurrence also in the setting of a/c systolic + diastolic HF - We will postpone DCCV for now, given likely need for invasive procedures/surgeries for new breast mass, as anticoagulation may need to be held  - She refuses  blocker due to previous side effects.  Avoid use of Cardizem given LV dysfunction.  - she is now on  IV amiodarone w/ controlled rates in the 90s but not tolerating due to nausea. Will treat w/ Zofran but will need to monitor QT closely w/  amio.  EKG from 5/2 reviewed QT/QTc 310/429 ms. Will get repeat EKG this afternoon.  - no dig w/ AKI  - she is now off of Eliquis and on IV heparin for possible procedures   3. Apical hypertrophic cardiomyopathy  - Echo 06/2019 showed mildly reduced LVEF 45-50% w/ Severe asymetric septal hypertrophy no SAM or LVOT gradient - Echo repeated 5/4 showed severe asymmetric left ventricular hypertrophy. LVOT diameter 1.9  cm  - denies syncope - has ICD. No VT detection on device interrogation    4. CAD - s/p CABG x 4 in 2018 - no anginal symptoms.  - hs troponin normal x 2, 15>>16 - not on ASA due to Eliquis  - refuses ? blocker - resume home rosuvastatin   5. Left Breast Mass, likely Metastatic Breast Cancer  - incidental finding noted on Chest CT 5/2, measuring 4.1 x 2.4 cm  - She has not had routine mammograms. Her mother had breast cancer, diagnosed in her 75s.  - CA 29.27 over 700. Oncology following  - she will need tissue biopsy to confirm. Oncology trying to arrange while inpatient  -  suspected to have bone mets   6. Multilevel Vertebral Compression Fractures, Likely Cancer Metastases   -  found to have acute appearing, multilevel vertebral  compression fractures (C7 vertebral body/ transfer process, T4 and T5 vertebral bodies, ? T3 vertebral body). No recent falls/trauma  - suspect pathologic bone fractures/ bone mets from breast CA - Oncology added Zometa  - Oncology and IR to decide on potential hypoplasty   7. AKI - Bump in SCr from 0.98>>1.66>>1.95, suspect contrast nephropathy from chest CT w/ contrast 5/2 - needs IV Lasix for volume overload but will avoid other nephrotoxic agents  - repeat BMP in am   Length of Stay: [place], PA-C  12/07/2019, 12:10 PM  Advanced Heart Failure Team Pager [phone] (M-F; 7a - 4p)  Please contact [facility] for night-coverage after hours (4p -7a ) and weekends on [email or web address]   Patient seen and examined with the  above-signed Advanced Practice Provider and/or Housestaff. I personally reviewed laboratory data, imaging studies and relevant notes. I independently examined the patient and formulated the important aspects of the plan. I have edited the note to reflect any of my changes or salient points. I have personally discussed the plan with the patient and/or family.  Remains in AF. Rate well controlled now on IV amio (80-90s) but she is having some nausea. Heparin on hold for breast biopsy today. Creatinine climbing and may be related to CIN. Agree that volume status looks elevated.  General: Sitting in bed . No resp difficulty HEENT: normal Neck: supple. + JVP to jaw Carotids 2+ bilat; no bruits. No lymphadenopathy or thryomegaly appreciated. Cor: PMI nondisplaced. Irregular rate & rhythm. No rubs, gallops or murmurs. Lungs: clear Abdomen: soft, nontender, nondistended. No hepatosplenomegaly. No bruits or masses. Good bowel sounds. Extremities: no cyanosis, clubbing, rash, edema Neuro: alert & orientedx3, cranial nerves grossly intact. moves all 4 extremities w/o difficulty. Affect pleasant  Undergoing breast biopsy today. Would continue IV amio if at all possible (nausea more common with po form than IV). Creatinine up. Suspect possible related to contrast nephropathy. Need to watch closely. Agree with 1 dose IV lasix. Resume anticoagulation as soon as possible after breast biopsy.   Glori Bickers, MD  3:28 PM

## 2019-12-07 NOTE — Plan of Care (Signed)
  Problem: Nutrition: Goal: Adequate nutrition will be maintained Outcome: Progressing   Problem: Pain Managment: Goal: General experience of comfort will improve Outcome: Progressing   

## 2019-12-07 NOTE — Progress Notes (Signed)
[place] for Heparin Indication: atrial fibrillation  Allergies  Allergen Reactions  . Levaquin [Levofloxacin] Other (See Comments)    Suicidal thoughts, extreme anxiety, panic attack  . Phenergan [Promethazine Hcl] Other (See Comments)    Restless    Patient Measurements: Height: 5\' 4"  (162.6 cm) Weight: 81.9 kg (180 lb 8 oz) IBW/kg (Calculated) : 54.7 Heparin Dosing Weight: 70.9 kg  Vital Signs: Temp: 98.4 F (36.9 C) (05/05 0430) Temp Source: Oral (05/05 0430) BP: 115/69 (05/05 0430) Pulse Rate: 83 (05/05 0430)  Labs: Recent Labs    12/04/19 1324 12/04/19 1324 12/04/19 1637 12/05/19 0442 12/05/19 0442 12/06/19 0402 12/06/19 1042 12/07/19 0512  HGB 11.2*   < >  --  11.1*   < > 11.0*  --  11.1*  HCT 37.6   < >  --  37.1  --  36.8  --  36.6  PLT 310   < >  --  328  --  340  --  337  APTT  --   --   --   --   --  91* 100* 114*  HEPARINUNFRC  --   --   --   --   --  >2.20*  --  >2.20*  CREATININE 1.06*   < >  --  0.98  --  1.66*  --  1.95*  TROPONINIHS 15  --  16  --   --   --   --   --    < > = values in this interval not displayed.    Estimated Creatinine Clearance: 27.4 mL/min (A) (by C-G formula based on SCr of 1.95 mg/dL (H)).   Medical History: Past Medical History:  Diagnosis Date  . Abnormal PFT   . Breast cancer metastasized to bone, unspecified laterality ([place]) 12/06/2019  . CAD (coronary artery disease), native coronary artery - 3 vessel 04/21/2015   a. NSTEMI 8-04/2015 felt 2/2 demand ischemia. b. 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  . Chronic diastolic CHF (congestive heart failure) ([place])   . Diabetes mellitus ([place])   . Goals of care, counseling/discussion 12/06/2019  . Hypertrophic cardiomyopathy ([place])   . Left atrial enlargement   . Mitral regurgitation    a. Echo 04/2015: moderate mitral regurgitation. b. F/u echo 05/2015: mild MR.  . NSTEMI (non-ST  elevated myocardial infarction) ([place]) 11/27/2016  . Paroxysmal atrial fibrillation ([name])   . Tricuspid regurgitation    a. Echo 04/2015: Mod-severe TR. b. Not mentioned on echo 05/2015  . Ventricular tachycardia ([place])    a. s/p [employer] Jude ICD implanted 04/2015.    Medications:  Scheduled:  . fentaNYL  1 patch Transdermal Q72H  . insulin aspart  0-15 Units Subcutaneous TID WC  . insulin aspart  0-5 Units Subcutaneous QHS  . metoprolol succinate  25 mg Oral Daily  . Vitamin D (Ergocalciferol)  50,000 Units Oral Q7 days   Infusions:  . amiodarone 30 mg/hr (12/06/19 1658)  . heparin 1,000 Units/hr (12/05/19 2125)    Assessment: 74 yo F with new L breast mass.  Pt on Eliquis PTA for pAF.  Plans to hold Eliquis and bridge with heparin infusion given need for breast biopsy.  Last dose of Eliquis was 5/3 at 0900.  Will start heparin infusion at 9pm tonight.  Eliquis can interfere with heparin level monitoring so will continue to utilize aPTTs for heparin monitoring until Eliquis is cleared.  Aptt this morning have trended up to above goal at 114s. No bleeding noted.   No cardioversion planned until after biopsy is done. Per Oncology she needs biopsy while admitted.   Goal of Therapy:  Heparin level 0.3-0.7 units/ml aPTT 66-102 seconds Monitor platelets by anticoagulation protocol: Yes   Plan:  Reduce heparin to 850 units/hr Heparin level, CBC, and aPTT daily while on heparin.  Erin Hearing PharmD., BCPS Clinical Pharmacist 12/07/2019 7:21 AM

## 2019-12-07 NOTE — H&P (Signed)
Chief Complaint: Left breast mass  Referring Physician(s): Burney Gauze  Supervising Physician: Arne Cleveland  Patient Status: [facility] - In-pt  History of Present Illness: [name] is a 74 y.o. female who presented to the emergency room with worsening midsternal chest pain and upper back pain x1 month.    CTA of the chest showed no evidence for thoracic aortic dissection, acute appearing compression fractures of the T4 and T5 vertebral bodies, questional fracture of the T3 vertebral body, acute fracture of the C7 transverse process, 4.1 x 2.4 cm left-sided breast mass.  Her breast tumor markers are over 700.  There is concern for metastatic breast cancer.  We are asked to perform a biopsy of the breast mass.  She has Afib and takes Eliquis which has been held and bridged with heparin drip.  Past Medical History:  Diagnosis Date  . Abnormal PFT   . Breast cancer metastasized to bone, unspecified laterality ([place]) 12/06/2019  . CAD (coronary artery disease), native coronary artery - 3 vessel 04/21/2015   a. NSTEMI 8-04/2015 felt 2/2 demand ischemia. b. 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  . Chronic diastolic CHF (congestive heart failure) ([place])   . Diabetes mellitus ([place])   . Goals of care, counseling/discussion 12/06/2019  . Hypertrophic cardiomyopathy ([place])   . Left atrial enlargement   . Mitral regurgitation    a. Echo 04/2015: moderate mitral regurgitation. b. F/u echo 05/2015: mild MR.  . NSTEMI (non-ST elevated myocardial infarction) ([place]) 11/27/2016  . Paroxysmal atrial fibrillation ([name])   . Tricuspid regurgitation    a. Echo 04/2015: Mod-severe TR. b. Not mentioned on echo 05/2015  . Ventricular tachycardia ([place])    a. s/p St. Jude ICD implanted 04/2015.    Past Surgical History:  Procedure Laterality Date  . APPLICATION OF WOUND VAC Left 01/06/2013   Procedure: APPLICATION OF WOUND VAC X 2;  Surgeon:  [name], MD;  Location: [facility];  Service: Orthopedics;  Laterality: Left;  left forearm  . APPLICATION OF WOUND VAC Left 01/09/2013   Procedure: APPLICATION OF WOUND VAC;  Surgeon: [name], MD;  Location: [facility];  Service: Orthopedics;  Laterality: Left;  . CARDIAC CATHETERIZATION N/A 04/19/2015   Procedure: Left Heart Cath and Coronary Angiography;  Surgeon: [name], MD;  Location: [employer] [facility];  Service: Cardiovascular;  Laterality: N/A;  . CARDIOVERSION N/A 09/20/2015   Procedure: CARDIOVERSION;  Surgeon: Pixie Casino, MD;  Location: [facility];  Service: Cardiovascular;  Laterality: N/A;  . CLIPPING OF ATRIAL APPENDAGE Left 12/04/2016   Procedure: CLIPPING OF ATRIAL APPENDAGE USING ATRICURE 37 ATRICLIP;  Surgeon: Melrose Nakayama, MD;  Location: [place];  Service: Open Heart Surgery;  Laterality: Left;  . CORONARY ARTERY BYPASS GRAFT N/A 12/04/2016   Procedure: CORONARY ARTERY BYPASS GRAFTING (CABG)x 4 WITH ENDOSCOPIC HARVESTING OF RIGHT SAPHENOUS VEIN;  Surgeon: Melrose Nakayama, MD;  Location: [facility];  Service: Open Heart Surgery;  Laterality: N/A;  . DENTAL SURGERY  Oct. 2011   several extractions, bone graft  . EP IMPLANTABLE DEVICE N/A 04/16/2015   Procedure: ICD Implant;  Surgeon: [name], MD;  Location: [place] CV LAB;  Service: Cardiovascular;  Laterality: N/A;  . I & D EXTREMITY Left 01/06/2013   Procedure: IRRIGATION AND DEBRIDEMENT LEFT ELBOW AND LEFT FOREARM ;  Surgeon: [name], MD;  Location: [facility];  Service: Orthopedics;  Laterality: Left;  . INCISION AND DRAINAGE Left 01/09/2013   Procedure: REDO INCISION AND DRAINAGE LEFT ELBOW;  Surgeon: [name], MD;  Location: [facility];  Service: Orthopedics;  Laterality: Left;  SUPINE, UPPER EXTERMITY DRAPE  . INTRAOPERATIVE TRANSESOPHAGEAL ECHOCARDIOGRAM N/A 12/04/2016   Procedure: INTRAOPERATIVE TRANSESOPHAGEAL ECHOCARDIOGRAM;  Surgeon: Melrose Nakayama, MD;  Location: [place];   Service: Open Heart Surgery;  Laterality: N/A;  . IR THORACENTESIS ASP PLEURAL SPACE W/IMG GUIDE  12/22/2016  . LEFT HEART CATH AND CORONARY ANGIOGRAPHY N/A 11/28/2016   Procedure: Left Heart Cath and Coronary Angiography;  Surgeon: Belva Crome, MD;  Location: [place] [facility];  Service: Cardiovascular;  Laterality: N/A;  . MAZE N/A 12/04/2016   Procedure: MAZE PROCEDURE;  Surgeon: Melrose Nakayama, MD;  Location: [place];  Service: Open Heart Surgery;  Laterality: N/A;  . RIGHT HEART CATH N/A 01/02/2017   Procedure: Right Heart Cath;  Surgeon: Jolaine Artist, MD;  Location: [facility];  Service: Cardiovascular;  Laterality: N/A;  . SECONDARY CLOSURE OF WOUND Left 01/09/2013   Procedure: SECONDARY CLOSURE OF WOUND  LEFT ELBOW;  Surgeon: [name], MD;  [facility];  Service: Orthopedics;  Laterality: Left;  . TEE WITHOUT CARDIOVERSION N/A 09/20/2015   Procedure: TRANSESOPHAGEAL ECHOCARDIOGRAM (TEE);  Surgeon: Pixie Casino, MD;  Location: [facility];  Service: Cardiovascular;  Laterality: N/A;    Allergies: Levaquin [levofloxacin] and Phenergan [promethazine hcl]  Medications: Prior to Admission medications   Medication Sig Start Date End Date Taking? Authorizing Provider  acetaminophen (TYLENOL) 500 MG tablet Take 500-1,000 mg by mouth every 6 (six) hours as needed for headache (pain).   Yes [provider]  ELIQUIS 5 MG TABS tablet TAKE ONE TABLET BY MOUTH TWICE A DAY Patient taking differently: Take 5 mg by mouth 2 (two) times daily.  10/07/19  Yes Croitoru, Mihai, MD  furosemide (LASIX) 40 MG tablet TAKE ONE TABLET BY MOUTH DAILY Patient taking differently: Take 40 mg by mouth daily.  06/14/19  Yes Bensimhon, Shaune Pascal, MD  glipiZIDE (GLUCOTROL) 5 MG tablet Take 1 tablet (5 mg total) by mouth daily. 06/16/16  Yes Charlott Rakes, MD  traMADol (ULTRAM) 50 MG tablet Take 25-50 mg by mouth every 6 (six) hours as needed (pain).  11/25/19  Yes [provider]  azithromycin (ZITHROMAX) 250 MG tablet Take 250-500 mg by mouth See admin instructions. 11/25/2019: take 2 tablets (500 mg) by mouth 1st day, then take 1 tablet (250 mg) daily on days 2-5 11/25/19   [provider]  Blood Glucose Monitoring Suppl (TRUE METRIX METER) DEVI 1 each by Does not apply route 2 (two) times daily. 04/27/15   Charlott Rakes, MD  glucose blood (TRUE METRIX BLOOD GLUCOSE TEST) test strip Use as instructed twice daily 04/27/15   Charlott Rakes, MD  rosuvastatin (CRESTOR) 10 MG tablet Take 1 tablet (10 mg total) by mouth daily. Patient not taking: Reported on 12/04/2019 07/06/19 10/04/19  Croitoru, Dani Gobble, MD  TRUEPLUS LANCETS 28G MISC Use as instructed twice daily. 04/27/15   Charlott Rakes, MD     Family History  Problem Relation Age of Onset  . Breast cancer Mother   . Diabetes Father   . Stroke Father   . Heart disease Sister   . Healthy Sister   . Gastric cancer Maternal Uncle     Social History   Socioeconomic History  . Marital status: Divorced    Spouse name: Not on  file  . Number of children: Not on file  . Years of education: Not on file  . Highest education level: Not on file  Occupational History  . Not on file  Tobacco Use  . Smoking status: Former Smoker    Years: 4.00    Quit date: 12/31/1980    Years since quitting: 38.9  . Smokeless tobacco: Never Used  Substance and Sexual Activity  . Alcohol use: No  . Drug use: No  . Sexual activity: Not Currently  Other Topics Concern  . Not on file  Social History Narrative   Divorced.  Lives with daughter and grandson. 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   Social Determinants of Health   Financial Resource Strain:   . Difficulty of Paying Living Expenses:   Food Insecurity:   . Worried About Charity fundraiser in the Last Year:   . Arboriculturist in the Last Year:   Transportation Needs:   . Film/video editor (Medical):   [name] Lack of Transportation  (Non-Medical):   Physical Activity:   . Days of Exercise per Week:   . Minutes of Exercise per Session:   Stress:   . Feeling of Stress :   Social Connections:   . Frequency of Communication with Friends and Family:   . Frequency of Social Gatherings with Friends and Family:   . Attends Religious Services:   . Active Member of Clubs or Organizations:   . Attends Archivist Meetings:   [name] Marital Status:      Review of Systems: A 12 point ROS discussed and pertinent positives are indicated in the HPI above.  All other systems are negative.  Review of Systems  Vital Signs: BP 120/73 (BP Location: Right Arm)   Pulse 91   Temp 98 F (36.7 C) (Oral)   Resp 17   Ht 5\' 4"  (1.626 m)   Wt 81.9 kg   SpO2 96%   BMI 30.98 kg/m   Physical Exam Vitals reviewed.  Constitutional:      Appearance: Normal appearance.  HENT:     Head: Normocephalic and atraumatic.  Eyes:     Extraocular Movements: Extraocular movements intact.  Cardiovascular:     Rate and Rhythm: Normal rate and regular rhythm.  Pulmonary:     Effort: Pulmonary effort is normal. No respiratory distress.     Breath sounds: Normal breath sounds.  Abdominal:     Palpations: Abdomen is soft.  Musculoskeletal:        General: Normal range of motion.  Skin:    General: Skin is warm and dry.  Neurological:     General: No focal deficit present.     Mental Status: She is alert and oriented to person, place, and time.  Psychiatric:        Mood and Affect: Mood normal.        Behavior: Behavior normal.        Thought Content: Thought content normal.        Judgment: Judgment normal.         Imaging: NM Bone Scan Whole Body  Result Date: 12/06/2019 CLINICAL DATA:  T-spine compression fracture. EXAM: NUCLEAR MEDICINE WHOLE BODY BONE SCAN TECHNIQUE: Whole body anterior and posterior images were obtained approximately 3 hours after intravenous injection of radiopharmaceutical. RADIOPHARMACEUTICALS:  21.4  mCi Technetium-42m MDP IV COMPARISON:  CT 12/04/2019. FINDINGS: Bilateral renal function excretion. Punctate focus of increased activity noted over the  right frontal skull. Punctate area of increased activity noted over a mid right anterior mid rib and possibly mid left anterior rib. Subtle punctate areas of increased activity over the lower cervical/upper thoracic cannot be excluded. Punctate area of increased activity noted over the upper lumbar spine. The possibility of metastatic disease presenting in this fashion cannot be excluded. Increased activity noted over the medial right clavicle and manubrium. This may be degenerative. Mild increased activity noted about the medial right knee joint space most likely degenerative. IMPRESSION: 1. Punctate areas of increased activity noted over the right frontal skull, mid right anterior rib and possibly mid left anterior rib. Subtle punctate areas of increased activity over the lower cervical/upper thoracic spine cannot be excluded. Punctate area of increased activity noted over the upper lumbar spine. The possibility metastatic disease presenting in this fashion cannot be excluded. 2. Increased activity noted over the medial right clavicle and manubrium. These changes may be degenerative. Punctate area of increased activity noted over the medial right knee joint space most likely degenerative. Plain film images of the above described areas may prove useful for further evaluation. Electronically Signed   By: Marcello Moores  Register   On: 12/06/2019 14:06   DG Chest Port 1 View  Result Date: 12/04/2019 CLINICAL DATA:  Right-sided chest pain. EXAM: PORTABLE CHEST 1 VIEW COMPARISON:  02/10/2017 FINDINGS: Stable heart size and appearance of pacing/ICD device and left atrial appendage clip. Status post prior CABG. Lungs demonstrate evidence pulmonary interstitial edema. No focal airspace consolidation, pleural fluid or pneumothorax identified. IMPRESSION: Pulmonary interstitial  edema. Electronically Signed   By: Aletta Edouard M.D.   On: 12/04/2019 14:17   CT ANGIO CHEST AORTA W/CM &/OR WO/CM  Result Date: 12/04/2019 CLINICAL DATA:  Chest pain and back pain. Aortic dissection suspected. EXAM: CT ANGIOGRAPHY CHEST WITH CONTRAST TECHNIQUE: Multidetector CT imaging of the chest was performed using the standard protocol during bolus administration of intravenous contrast. Multiplanar CT image reconstructions and MIPs were obtained to evaluate the vascular anatomy. CONTRAST:  187mL OMNIPAQUE IOHEXOL 350 MG/ML SOLN COMPARISON:  None. FINDINGS: Cardiovascular: There is no evidence for a thoracic aortic dissection. There is no evidence for thoracic aortic aneurysm. Atherosclerotic changes are noted of the thoracic aorta. The main pulmonary artery is slightly dilated. There is no large centrally located pulmonary embolism. Detection of smaller pulmonary emboli is limited by technique. The heart is enlarged. There is no significant pericardial effusion. The patient is status post prior CABG. Coronary artery calcifications are noted. 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 There is reflux of contrast in the IVC consistent with underlying cardiac dysfunction. Mediastinum/Nodes: --mildly prominent mediastinal and hilar lymph nodes are noted. For example there is a precarinal lymph node measuring approximately 1.4 cm. These are presumably reactive. --No axillary lymphadenopathy. --No supraclavicular lymphadenopathy. --Normal thyroid gland. --The esophagus is unremarkable Lungs/Pleura: There is a small right-sided pleural effusion. There is some mild bilateral interlobular septal thickening. There is a probable trace left-sided pleural effusion. There is no pneumothorax. Upper Abdomen: There may be cholelithiasis within the partially  visualized gallbladder. Musculoskeletal: There is a questionable 4.1 x 2.4 cm left-sided breast mass (axial series 5, image 44). There are acute appearing compression fractures of the T4 and T5 vertebral bodies. There is a questionable fracture of the T3 vertebral body. There is an acute fracture of the C7 transverse  process. There is a questionable fracture of the superior endplate of the C7 vertebral body. Review of the MIP images confirms the above findings. IMPRESSION: 1. There is no evidence for a thoracic aortic dissection. 2. There are acute appearing compression fractures of the T4 and T5 vertebral bodies. There is a questionable fracture of the T3 vertebral body. There is an acute fracture of the C7 transverse process. There is a questionable fracture through the superior endplate of the C7 vertebral body. 3. There is a questionable 4.1 x 2.4 cm left-sided breast mass. Correlation with physical exam and mammography is recommended. 4. Cardiomegaly with small right-sided pleural effusion and mild bilateral interlobular septal thickening, suggestive of interstitial edema. 5. Reflux of contrast in the IVC consistent with underlying cardiac dysfunction. 6. 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 7. Possible cholelithiasis within the partially visualized gallbladder. Aortic Atherosclerosis (ICD10-I70.0). Electronically Signed   By: Constance Holster M.D.   On: 12/04/2019 21:31   ECHOCARDIOGRAM COMPLETE  Result Date: 12/06/2019    ECHOCARDIOGRAM REPORT   Patient Name:   [name] Date of Exam: 12/06/2019 Medical Rec #:  [id number]        Height:       64.0 in Accession #:    [id number]       Weight:       176.1 lb Date of Birth:  [birth year]        BSA:          1.853 m Patient Age:    77 years         BP:           100/68 mmHg Patient Gender: F                 HR:           90 bpm. Exam Location:  Inpatient Procedure: 2D Echo Indications:    acute systolic chf [id number]  History:        Patient has prior history of Echocardiogram examinations, most                 recent 06/22/2019. Prior CABG and Defibrillator, chronic kidney                 disease, Arrythmias:Atrial Fibrillation, Signs/Symptoms:Chest                 Pain; Risk Factors:Hypertension, Dyslipidemia and Diabetes.  Sonographer:    Johny Chess Referring Phys: Elizabeth  1. Left ventricular ejection fraction, by estimation, is 35 to 40%. The left ventricle has moderately decreased function. The left ventricle demonstrates regional wall motion abnormalities (see scoring diagram/findings for description). There is severe asymmetric left ventricular hypertrophy. Left ventricular diastolic function could not be evaluated.  2. Right ventricular systolic function is low normal. The right ventricular size is normal. There is severely elevated pulmonary artery systolic pressure.  3. Left atrial size was severely dilated.  4. Right atrial size was mildly dilated.  5. The mitral valve is degenerative. Mild mitral valve regurgitation. No evidence of mitral stenosis.  6. Tricuspid valve regurgitation is moderate.  7. The aortic valve is bicuspid. Aortic valve regurgitation is not visualized. Mild aortic valve sclerosis is present, with no evidence of aortic valve stenosis.  8. The inferior vena cava is dilated in size with <50% respiratory  variability, suggesting right atrial pressure of 15 mmHg. Conclusion(s)/Recommendation(s): EF has decreased since prior echo. FINDINGS  Left Ventricle: Left ventricular ejection fraction, by estimation, is 35 to 40%. The left ventricle has moderately decreased function. The left ventricle demonstrates regional wall motion abnormalities. The left ventricular internal cavity size was normal in size. There is severe asymmetric left ventricular  hypertrophy. Left ventricular diastolic function could not be evaluated due to atrial fibrillation. Left ventricular diastolic function could not be evaluated. Right Ventricle: The right ventricular size is normal. No increase in right ventricular wall thickness. Right ventricular systolic function is low normal. There is severely elevated pulmonary artery systolic pressure. The tricuspid regurgitant velocity is 3.75 m/s, and with an assumed right atrial pressure of 15 mmHg, the estimated right ventricular systolic pressure is [id number] mmHg. Left Atrium: Left atrial size was severely dilated. Right Atrium: Right atrial size was mildly dilated. Pericardium: There is no evidence of pericardial effusion. Mitral Valve: The mitral valve is degenerative in appearance. There is moderate thickening of the mitral valve leaflet(s). There is moderate calcification of the mitral valve leaflet(s). Severe mitral annular calcification. Mild mitral valve regurgitation. No evidence of mitral valve stenosis. Tricuspid Valve: The tricuspid valve is normal in structure. Tricuspid valve regurgitation is moderate . No evidence of tricuspid stenosis. Aortic Valve: The aortic valve is bicuspid. . There is mild thickening and moderate calcification of the aortic valve. Aortic valve regurgitation is not visualized. Mild aortic valve sclerosis is present, with no evidence of aortic valve stenosis. There is mild thickening of the aortic valve. There is moderate calcification of the aortic valve. Pulmonic Valve: The pulmonic valve was grossly normal. Pulmonic valve regurgitation is mild. No evidence of pulmonic stenosis. Aorta: The aortic root, ascending aorta and aortic arch are all structurally normal, with no evidence of dilitation or obstruction. Venous: The inferior vena cava is dilated in size with less than 50% respiratory variability, suggesting right atrial pressure of 15 mmHg. IAS/Shunts: No atrial level shunt detected by color flow  Doppler. Additional Comments: A pacer wire is visualized in the right atrium and right ventricle.  LEFT VENTRICLE PLAX 2D LVIDd:         4.80 cm LVIDs:         3.90 cm LV PW:         1.10 cm LV IVS:        1.85 cm LVOT diam:     1.90 cm LV SV:         40 LV SV Index:   22 LVOT Area:     2.84 cm  LEFT ATRIUM              Index       RIGHT ATRIUM           Index LA diam:        4.50 cm  2.43 cm/m  RA Area:     19.10 cm LA Vol (A2C):   96.2 ml  51.91 ml/m RA Volume:   49.50 ml  26.71 ml/m LA Vol (A4C):   94.1 ml  50.77 ml/m LA Biplane Vol: 101.0 ml 54.50 ml/m  AORTIC VALVE LVOT Vmax:   83.50 cm/s LVOT Vmean:  51.200 cm/s LVOT VTI:    0.141 m TRICUSPID VALVE TR Peak grad:   56.2 mmHg TR Vmax:        375.00 cm/s  SHUNTS Systemic VTI:  0.14 m Systemic Diam: 1.90 cm Buford Dresser MD Electronically signed by Buford Dresser  MD Signature Date/Time: 12/06/2019/4:48:28 PM    Final     Labs:  CBC: Recent Labs    12/04/19 1324 12/05/19 0442 12/06/19 0402 12/07/19 0512  WBC 8.6 8.3 7.8 9.5  HGB 11.2* 11.1* 11.0* 11.1*  HCT 37.6 37.1 36.8 36.6  PLT 310 328 340 337    COAGS: Recent Labs    12/06/19 0402 12/06/19 1042 12/07/19 0512  APTT 91* 100* 114*    BMP: Recent Labs    12/04/19 1324 12/05/19 0442 12/06/19 0402 12/07/19 0512  NA 138 142 138 135  K 3.5 3.8 3.6 4.5  CL 106 108 103 101  CO2 20* 20* 23 22  GLUCOSE 187* 134* 141* 202*  BUN 20 18 23  33*  CALCIUM 9.0 9.1 9.0 9.0  CREATININE 1.06* 0.98 1.66* 1.95*  GFRNONAA 53* 58* 31* 25*  GFRAA >60 >60 36* 29*    LIVER FUNCTION TESTS: Recent Labs    07/05/19 1137 12/05/19 0442 12/06/19 0402 12/07/19 0512  BILITOT 0.3 1.2 1.1 0.9  AST 11 33 28 28  ALT 13 28 25 25   ALKPHOS 103 103 101 115  PROT 6.9 6.7 6.5 6.7  ALBUMIN 4.3 3.3* 3.3* 3.6    TUMOR MARKERS: No results for input(s): AFPTM, CEA, CA199, CHROMGRNA in the last 8760 hours.  Assessment and Plan:  Left breast mass  Will proceed with image guided  biopsy today by Dr. Vernard Gambles.  Risks and benefits of breast biopsy was discussed with the patient and/or patient's family including, but not limited to bleeding, infection, damage to adjacent structures or low yield requiring additional tests.  All of the questions were answered and there is agreement to proceed.  Consent signed and in chart.  Thank you for this interesting consult.  I greatly enjoyed meeting JERICAH STEINHILBER and look forward to participating in their care.  A copy of this report was sent to the requesting provider on this date.  Electronically Signed: Murrell Redden, PA-C   12/07/2019, 1:40 PM      I spent a total of 40 Minutes in face to face in clinical consultation, greater than 50% of which was counseling/coordinating care for breast biopsy.

## 2019-12-08 DIAGNOSIS — I2510 Atherosclerotic heart disease of native coronary artery without angina pectoris: Secondary | ICD-10-CM

## 2019-12-08 DIAGNOSIS — Z170 Estrogen receptor positive status [ER+]: Secondary | ICD-10-CM

## 2019-12-08 LAB — CBC
HCT: 35.3 % — ABNORMAL LOW (ref 36.0–46.0)
Hemoglobin: 10.7 g/dL — ABNORMAL LOW (ref 12.0–15.0)
MCH: 27 pg (ref 26.0–34.0)
MCHC: 30.3 g/dL (ref 30.0–36.0)
MCV: 89.1 fL (ref 80.0–100.0)
Platelets: 267 10*3/uL (ref 150–400)
RBC: 3.96 MIL/uL (ref 3.87–5.11)
RDW: 17.7 % — ABNORMAL HIGH (ref 11.5–15.5)
WBC: 9.3 10*3/uL (ref 4.0–10.5)
nRBC: 0 % (ref 0.0–0.2)

## 2019-12-08 LAB — GLUCOSE, CAPILLARY
Glucose-Capillary: 144 mg/dL — ABNORMAL HIGH (ref 70–99)
Glucose-Capillary: 215 mg/dL — ABNORMAL HIGH (ref 70–99)
Glucose-Capillary: 318 mg/dL — ABNORMAL HIGH (ref 70–99)
Glucose-Capillary: 94 mg/dL (ref 70–99)

## 2019-12-08 LAB — COMPREHENSIVE METABOLIC PANEL
ALT: 21 U/L (ref 0–44)
AST: 27 U/L (ref 15–41)
Albumin: 3.2 g/dL — ABNORMAL LOW (ref 3.5–5.0)
Alkaline Phosphatase: 98 U/L (ref 38–126)
Anion gap: 11 (ref 5–15)
BUN: 36 mg/dL — ABNORMAL HIGH (ref 8–23)
CO2: 19 mmol/L — ABNORMAL LOW (ref 22–32)
Calcium: 7.9 mg/dL — ABNORMAL LOW (ref 8.9–10.3)
Chloride: 104 mmol/L (ref 98–111)
Creatinine, Ser: 1.82 mg/dL — ABNORMAL HIGH (ref 0.44–1.00)
GFR calc Af Amer: 32 mL/min — ABNORMAL LOW (ref >60)
GFR calc non Af Amer: 27 mL/min — ABNORMAL LOW (ref >60)
Glucose, Bld: 130 mg/dL — ABNORMAL HIGH (ref 70–99)
Potassium: 4.6 mmol/L (ref 3.5–5.1)
Sodium: 134 mmol/L — ABNORMAL LOW (ref 135–145)
Total Bilirubin: 0.7 mg/dL (ref 0.3–1.2)
Total Protein: 6.1 g/dL — ABNORMAL LOW (ref 6.5–8.1)

## 2019-12-08 LAB — APTT
aPTT: 63 seconds — ABNORMAL HIGH (ref 24–36)
aPTT: 76 seconds — ABNORMAL HIGH (ref 24–36)

## 2019-12-08 LAB — HEPARIN LEVEL (UNFRACTIONATED): Heparin Unfractionated: 1.32 IU/mL — ABNORMAL HIGH (ref 0.30–0.70)

## 2019-12-08 LAB — MAGNESIUM: Magnesium: 1.9 mg/dL (ref 1.7–2.4)

## 2019-12-08 MED ORDER — APIXABAN 5 MG PO TABS
5.0000 mg | ORAL_TABLET | Freq: Two times a day (BID) | ORAL | Status: DC
  Administered 2019-12-08 – 2019-12-10 (×5): 5 mg via ORAL
  Filled 2019-12-08 (×6): qty 1

## 2019-12-08 MED ORDER — FUROSEMIDE 10 MG/ML IJ SOLN
40.0000 mg | Freq: Once | INTRAMUSCULAR | Status: AC
  Administered 2019-12-08: 40 mg via INTRAVENOUS
  Filled 2019-12-08: qty 4

## 2019-12-08 MED ORDER — ADULT MULTIVITAMIN W/MINERALS CH
1.0000 | ORAL_TABLET | Freq: Every day | ORAL | Status: DC
  Filled 2019-12-08: qty 1

## 2019-12-08 MED ORDER — FUROSEMIDE 10 MG/ML IJ SOLN
80.0000 mg | Freq: Once | INTRAMUSCULAR | Status: AC
  Administered 2019-12-08: 80 mg via INTRAVENOUS
  Filled 2019-12-08: qty 8

## 2019-12-08 NOTE — Progress Notes (Signed)
Initial Nutrition Assessment  DOCUMENTATION CODES:   Not applicable  INTERVENTION:    Ensure Enlive po BID, each supplement provides 350 kcal and 20 grams of protein.  Multivitamin with minerals daily.  NUTRITION DIAGNOSIS:   Inadequate oral intake related to nausea, decreased appetite as evidenced by meal completion < 50%.  GOAL:   Patient will meet greater than or equal to 90% of their needs  MONITOR:   PO intake, Supplement acceptance  REASON FOR ASSESSMENT:   Malnutrition Screening Tool    ASSESSMENT:   74 yo female admitted with worsening chest pain. Incidentally found to have left breast mass, suspected metastatic breast cancer. PMH includes PAF, CHF, V tachycardia S/P ICD, CAD, HTN, HLD, cardiomyopathy.   Patient reports 20 lb weight loss recently. Per review of weight encounters, she weighed 85.3 kg on 07/05/19, currently 82.1 kg. 4% weight loss in the past 6 months is not significant for the time frame. Current weight could be elevated due to edema. Fluid overload could be masking actual weight loss. She c/o nausea and poor appetite. Intake of meals has been very poor; consuming < 25% of meals.  Labs reviewed. Na 134 (L) CBG's: 164-144  Medications reviewed and include lasix, novolog, vitamin D.  Per RN documentation, patient has non-pitting edema to BLE.  NUTRITION - FOCUSED PHYSICAL EXAM:  unable to complete  Diet Order:   Diet Order            Diet Heart Room service appropriate? Yes; Fluid consistency: Thin  Diet effective now              EDUCATION NEEDS:   No education needs have been identified at this time  Skin:  Skin Assessment: Reviewed RN Assessment  Last BM:  5/2  Height:   Ht Readings from Last 1 Encounters:  12/04/19 5\' 4"  (1.626 m)    Weight:   Wt Readings from Last 1 Encounters:  12/08/19 82.1 kg    Ideal Body Weight:  54.5 kg  BMI:  Body mass index is 31.05 kg/m.  Estimated Nutritional Needs:   Kcal:   1800-2000  Protein:  100-115 gm  Fluid:  1.8 L    Molli Barrows, RD, LDN, CNSC Please refer to Amion for contact information.

## 2019-12-08 NOTE — Progress Notes (Signed)
Physical Therapy Treatment Patient Details Name: [name] MRN: [id number] DOB: [birth year] Today's Date: 12/08/2019    History of Present Illness [name] is a 74 y.o. female with medical history significant of paroxysmal A. fib on Eliquis, combined systolic and diastolic CHF, ventricular tachycardia status post ICD placed in 2013, coronary artery disease status post CABG, hypertension, hyperlipidemia, hypertrophic cardiomyopathy presents to emergency department due to worsening midsternal chest pain and upper back pain since 1 month.    PT Comments    Pt sitting EoB on entry, distraught with inability to control her body and her health. Pt has been nauseous since early this morning and reports fatigue. Pt also reports her pain is better, however it continues to hinder her safe mobility, in addition to her decreased strength and endurance. Physician requested walking SaO2, at rest on RA pt SaO2 94%O2 with ambulation pt experiences 3/4 DoE however her SaO2 only dropped to 92%O2 on RA. D/c plans continue to be appropriate at this time. PT will continue to follow acutely.      Follow Up Recommendations  Home health PT     Equipment Recommendations  None recommended by PT       Precautions / Restrictions Precautions Precautions: Fall Restrictions Weight Bearing Restrictions: No    Mobility  Bed Mobility               General bed mobility comments: sitting EoB on entry   Transfers Overall transfer level: Needs assistance Equipment used: Rolling walker (2 wheeled) Transfers: Sit to/from Stand Sit to Stand: Min guard         General transfer comment: min guard for safety   Ambulation/Gait Ambulation/Gait assistance: Min guard Gait Distance (Feet): 60 Feet Assistive device: Rolling walker (2 wheeled) Gait Pattern/deviations: Step-through pattern;Decreased stride length Gait velocity: decr Gait velocity interpretation: <1.8 ft/sec, indicate of risk for  recurrent falls General Gait Details: min guard for safety, initial gait strong and steady, increasing instability with distance, due to pain and fatigue          Balance Overall balance assessment: Needs assistance Sitting-balance support: Feet supported Sitting balance-Leahy Scale: Good     Standing balance support: Bilateral upper extremity supported;During functional activity Standing balance-Leahy Scale: Fair                              Cognition Arousal/Alertness: Awake/alert Behavior During Therapy: WFL for tasks assessed/performed Overall Cognitive Status: Within Functional Limits for tasks assessed                                           General Comments General comments (skin integrity, edema, etc.): Pt with nausea throughout session.       Pertinent Vitals/Pain Pain Assessment: 0-10 Pain Score: 7 (pt reports pain improved despite same numerical rating ) Pain Location: upper back Pain Descriptors / Indicators: Shooting;Discomfort Pain Intervention(s): Limited activity within patient's tolerance;Monitored during session;Repositioned           PT Goals (current goals can now be found in the care plan section) Acute Rehab PT Goals Patient Stated Goal: to decrease pain, return home PT Goal Formulation: With patient Time For Goal Achievement: 12/19/19 Potential to Achieve Goals: Good Progress towards PT goals: Progressing toward goals    Frequency    Min 3X/week  PT Plan Current plan remains appropriate       AM-PAC PT "6 Clicks" Mobility   Outcome Measure  Help needed turning from your back to your side while in a flat bed without using bedrails?: A Little Help needed moving from lying on your back to sitting on the side of a flat bed without using bedrails?: A Little Help needed moving to and from a bed to a chair (including a wheelchair)?: A Little Help needed standing up from a chair using your arms (e.g.,  wheelchair or bedside chair)?: A Little Help needed to walk in hospital room?: A Little Help needed climbing 3-5 steps with a railing? : A Lot 6 Click Score: 17    End of Session Equipment Utilized During Treatment: Gait belt Activity Tolerance: Patient tolerated treatment well;Patient limited by pain Patient left: in bed Nurse Communication: Mobility status PT Visit Diagnosis: Unsteadiness on feet (R26.81);Muscle weakness (generalized) (M62.81);Other abnormalities of gait and mobility (R26.89);Difficulty in walking, not elsewhere classified (R26.2);Pain Pain - part of body: (upper back)     Time: [id number] PT Time Calculation (min) (ACUTE ONLY): 15 min  Charges:  $Gait Training: 8-22 mins                     Jaydin Jalomo B. Migdalia Dk PT, DPT Acute Rehabilitation Services Pager [phone] Office [phone]    [place] 12/08/2019, 1:25 PM

## 2019-12-08 NOTE — Progress Notes (Addendum)
Advanced Heart Failure Rounding Note  PCP-Cardiologist: No primary care provider on file.   Subjective:    Oncology following for new diagnosis of metastatic breast cancer. CA 27.29 is over 700. Vertebral fractures likely pathologic.   12/07/19 S/P Left Breast biopsy   Echo repeated on 12/06/19 . EF is lower compared to prior exam. Now 35-40% (previously 45-50%). RV normal. RA pressure 15. RVSP severely elevated at 30mmHg.   Remains on amio drip 30 mg/hour + heparin drip.   Yesterday given 40 mg IV lasix. Weight unchanged. \  Complaining of nausea. Denies SOB.      Objective:   Weight Range: 82.1 kg Body mass index is 31.05 kg/m.   Vital Signs:   Temp:  [98 F (36.7 C)-98.2 F (36.8 C)] 98.1 F (36.7 C) (05/06 0500) Pulse Rate:  [54-104] 54 (05/06 0500) Resp:  [17-18] 18 (05/05 2054) BP: (102-138)/(70-92) 138/92 (05/06 0500) SpO2:  [92 %-99 %] 92 % (05/06 0500) Weight:  [82.1 kg] 82.1 kg (05/06 0500) Last BM Date: 12/04/19(senokot ,metamucil given)  Weight change: Filed Weights   12/06/19 0400 12/07/19 0430 12/08/19 0500  Weight: 79.9 kg 81.9 kg 82.1 kg    Intake/Output:   Intake/Output Summary (Last 24 hours) at 12/08/2019 0812 Last data filed at 12/07/2019 2000 Gross per 24 hour  Intake 214.67 ml  Output 400 ml  Net -185.33 ml      Physical Exam    General:  Tearful. Sitting on the side of the bed. No resp difficulty HEENT: normal Neck: supple. TR waves JVP 11-12. Carotids 2+ bilat; no bruits. No lymphadenopathy or thryomegaly appreciated. Cor: PMI nondisplaced. Irregular rate & rhythm. No rubs, gallops or murmurs. Lungs: clear Abdomen: soft, nontender, nondistended. No hepatosplenomegaly. No bruits or masses. Good bowel sounds. Extremities: no cyanosis, clubbing, rash, edema Neuro: alert & orientedx3, cranial nerves grossly intact. moves all 4 extremities w/o difficulty. Affect pleasant  Telemetry   A fib 100s personally reviewed.    EKG    No  new EKG to review today. EKG from 5/2 reviewed QT/QTc 310/429 ms  Labs    CBC Recent Labs    12/07/19 0512 12/08/19 0307  WBC 9.5 9.3  HGB 11.1* 10.7*  HCT 36.6 35.3*  MCV 89.3 89.1  PLT 337 [id number]   Basic Metabolic Panel Recent Labs    12/07/19 0512 12/08/19 0307  NA 135 134*  K 4.5 4.6  CL 101 104  CO2 22 19*  GLUCOSE 202* 130*  BUN 33* 36*  CREATININE 1.95* 1.82*  CALCIUM 9.0 7.9*  MG 2.0 1.9   Liver Function Tests Recent Labs    12/07/19 0512 12/08/19 0307  AST 28 27  ALT 25 21  ALKPHOS 115 98  BILITOT 0.9 0.7  PROT 6.7 6.1*  ALBUMIN 3.6 3.2*   No results for input(s): LIPASE, AMYLASE in the last 72 hours. Cardiac Enzymes No results for input(s): CKTOTAL, CKMB, CKMBINDEX, TROPONINI in the last 72 hours.  BNP: BNP (last 3 results) Recent Labs    12/04/19 1324  BNP 581.1*    ProBNP (last 3 results) No results for input(s): PROBNP in the last 8760 hours.   D-Dimer No results for input(s): DDIMER in the last 72 hours. Hemoglobin A1C No results for input(s): HGBA1C in the last 72 hours. Fasting Lipid Panel No results for input(s): CHOL, HDL, LDLCALC, TRIG, CHOLHDL, LDLDIRECT in the last 72 hours. Thyroid Function Tests No results for input(s): TSH, T4TOTAL, T3FREE, THYROIDAB in the last  72 hours.  Invalid input(s): FREET3  Other results:   Imaging    No results found.   Medications:     Scheduled Medications: . feeding supplement (ENSURE ENLIVE)  237 mL Oral BID BM  . fentaNYL  1 patch Transdermal Q72H  . insulin aspart  0-15 Units Subcutaneous TID WC  . insulin aspart  0-5 Units Subcutaneous QHS  . metoprolol succinate  25 mg Oral Daily  . Vitamin D (Ergocalciferol)  50,000 Units Oral Q7 days    Infusions: . amiodarone 30 mg/hr (12/08/19 0519)  . heparin 950 Units/hr (12/08/19 0518)    PRN Medications: acetaminophen **OR** acetaminophen, HYDROcodone-acetaminophen, lidocaine (PF), morphine injection, ondansetron (ZOFRAN) IV,  oxyCODONE, polyethylene glycol, senna-docusate    Assessment/Plan   1. 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 Prolonged post-hospital course with low output HF despite relatively preserved EF. RHC suggestive of primarily RV failure. -EF 45-50% post CABG 5/18 - Technetium pyrophosphate scan negative for amyloid. - echo 06/2019 showed mildly reduced LVEF 45-50% w/ Severe asymetric septal hypertrophy no SAM or LVOT gradient. RV ok  - Now admitted w/ NYHA III symptoms + mild volume overload. CXR w/ interstitial edema + right sided pleural effusion, BNP 581. TSH normal. Device interrogation 5/2 showed CorVue c/w mild volume overload, in the setting of persistent Afib (since 10/07/19). -  - Echo 12/06/19 EF 35-40% - She will likely need to start chemo for metastatic breast CA. Depending on therapies selected, may need surveillance echos q 3-6 months - Received IV lasix 12/07/19 but had poor results. Creatinine 1.9>1.8   - Mild volume overload. Give another 40 mg IV lasix x1.  - Hold off on  ARB/ARNi retrial + spiro given AKI.  -I am stopping metoprolol because she refuses to take due to previous side effects.   - unable to start digoxin currently due to AKI   2. Atrial Fibrillation  - prior h/o Afib/ Flutter w/ previous cardioversions + MAZE and LAA clip in 2018 at time of CABG. On chronic a/c w/ Eliquis PTA (no missed doses in the last 30 days) - now back in AFib. Device interrogation shows that she has been in persistent afib since 10/07/19 - TSH normal  - recurrence also in the setting of a/c systolic + diastolic HF - She refuses  blocker   Avoid use of Cardizem given LV dysfunction.  - Continue amio 30 mg per hour.  -Continue heparin drip. No eliquis for now.  - Needs cardioversion but will need hold off until we know about future procedures.   3. Apical hypertrophic cardiomyopathy  - Echo 06/2019 showed mildly reduced LVEF 45-50% w/  Severe asymetric septal hypertrophy no SAM or LVOT gradient - Echo repeated 5/4 showed severe asymmetric left ventricular hypertrophy. LVOT diameter 1.9 cm  - denies syncope - has ICD. No VT detection on device interrogation   4. CAD - s/p CABG x 4 in 2018 - no anginal symptoms.  - hs troponin normal x 2, 15>>16 - refuses ? blocker - resume home rosuvastatin   5. Left Breast Mass, likely Metastatic Breast Cancer  - incidental finding noted on Chest CT 5/2, measuring 4.1 x 2.4 cm  - She has not had routine mammograms. Her mother had breast cancer, diagnosed in her 39s.  - CA 29.27 over 700. Oncology following  - S/P L Breast Biopsty 12/07/19   -  suspected to have bone mets   6.  Multilevel Vertebral Compression Fractures, Likely Cancer Metastases   -  found to have acute appearing, multilevel vertebral  compression fractures (C7 vertebral body/ transfer process, T4 and T5 vertebral bodies, ? T3 vertebral body). No recent falls/trauma  - suspect pathologic bone fractures/ bone mets from breast CA - Oncology added Zometa  - Oncology and IR to decide on potential hypoplasty   7. AKI - Bump in SCr from 0.98>>1.66>>1.95>>1.8, suspect contrast nephropathy from chest CT w/ contrast 5/2 - BMET in am.    Length of Stay: [place], NP  12/08/2019, 8:12 AM  Advanced Heart Failure Team Pager [phone] (M-F; 7a - 4p)  Please contact [facility] for night-coverage after hours (4p -7a ) and weekends on [email or web address]  Patient seen and examined with the above-signed Advanced Practice Provider and/or Housestaff. I personally reviewed laboratory data, imaging studies and relevant notes. I independently examined the patient and formulated the important aspects of the plan. I have edited the note to reflect any of my changes or salient points. I have personally discussed the plan with the patient and/or family.  She remains in AF but now with improved rate control on IV amio. Breast pathology  back with ductal CA. (receptors pending)  Continues with nausea. Also remains volume overloaded with poor response to IV lasix.   General:  Weak appearing. No resp difficulty HEENT: normal Neck: supple. JVP to jaw . Carotids 2+ bilat; no bruits. No lymphadenopathy or thryomegaly appreciated. Cor: PMI nondisplaced.Irregular rate & rhythm. No rubs, gallops or murmurs. Lungs: clear Abdomen: soft, nontender, nondistended. No hepatosplenomegaly. No bruits or masses. Good bowel sounds. Extremities: no cyanosis, clubbing, rash, 1+ edema Neuro: alert & orientedx3, cranial nerves grossly intact. moves all 4 extremities w/o difficulty. Affect pleasant  Suspect nausea may be related to fentanyl patch as she has tolerated amio previously. Will remove and reassess. We discussed results of breast bx.   Will plan TEE/DC-CV tomorrow in an attempt to get her back in nSR and promote LV improvement. If we can get EF back up into at least 40-45% range I would not be opposed to treating with Herceptin.   Will give IV lasix again tonight,. Can switch back to NOAC.   Glori Bickers, MD  11:05 PM

## 2019-12-08 NOTE — Progress Notes (Signed)
[facility] for Heparin Indication: atrial fibrillation  Allergies  Allergen Reactions  . Levaquin [Levofloxacin] Other (See Comments)    Suicidal thoughts, extreme anxiety, panic attack  . Phenergan [Promethazine Hcl] Other (See Comments)    Restless    Patient Measurements: Height: 5\' 4"  (162.6 cm) Weight: 82.1 kg (180 lb 14.4 oz) IBW/kg (Calculated) : 54.7 Heparin Dosing Weight: 70.9 kg  Vital Signs: Temp: 98.1 F (36.7 C) (05/06 0500) Temp Source: Oral (05/06 0500) BP: 138/92 (05/06 0500) Pulse Rate: 54 (05/06 0500)  Labs: Recent Labs    12/06/19 0402 12/06/19 1042 12/07/19 0512 12/07/19 0512 12/07/19 1745 12/08/19 0307 12/08/19 1246  HGB 11.0*  --  11.1*  --   --  10.7*  --   HCT 36.8  --  36.6  --   --  35.3*  --   PLT 340  --  337  --   --  267  --   APTT 91*   < > 114*   < > 34 63* 76*  HEPARINUNFRC >2.20*  --  >2.20*  --   --  1.32*  --   CREATININE 1.66*  --  1.95*  --   --  1.82*  --    < > = values in this interval not displayed.    Estimated Creatinine Clearance: 29.4 mL/min (A) (by C-G formula based on SCr of 1.82 mg/dL (H)).  Assessment: 74 y.o. female with h/o Afib, Eliquis on hold, heparin bridging.  Aptt was low this morning, now in range after rate adjustment.  MD now switching back to Eliquis.  Goal of Therapy:  Heparin level 0.3-0.7 units/ml aPTT 66-102 seconds Monitor platelets by anticoagulation protocol: Yes   Plan:  Stop heparin. Resume Eliquis 5 mg BID.  Marguerite Olea, [facility] Clinical Pharmacist Phone [phone]  12/08/2019 1:39 PM   12/08/2019 1:38 PM

## 2019-12-08 NOTE — Progress Notes (Addendum)
HEMATOLOGY-ONCOLOGY PROGRESS NOTE  SUBJECTIVE: States pain is better today.  She is up walking with physical therapy at the time my visit.  She has no other complaints today.  REVIEW OF SYSTEMS:   Constitutional: Denies fevers, chills  Eyes: Denies blurriness of vision Ears, nose, mouth, throat, and face: Denies mucositis or sore throat Respiratory: Denies cough, dyspnea or wheezes Cardiovascular: Denies palpitation, chest discomfort Gastrointestinal:  Denies nausea, heartburn or change in bowel habits Skin: Denies abnormal skin rashes Lymphatics: Denies new lymphadenopathy or easy bruising Neurological:Denies numbness, tingling or new weaknesses MSK: Ongoing back pain which has improved Behavioral/Psych: Mood is stable, no new changes  Extremities: No lower extremity edema All other systems were reviewed with the patient and are negative.  I have reviewed the past medical history, past surgical history, social history and family history with the patient and they are unchanged from previous note.   PHYSICAL EXAMINATION: ECOG PERFORMANCE STATUS: 2 - Symptomatic, <50% confined to bed  Vitals:   12/07/19 2054 12/08/19 0500  BP: 102/70 (!) 138/92  Pulse: (!) 104 (!) 54  Resp: 18   Temp: 98.2 F (36.8 C) 98.1 F (36.7 C)  SpO2: 92% 92%   Filed Weights   12/06/19 0400 12/07/19 0430 12/08/19 0500  Weight: 79.9 kg 81.9 kg 82.1 kg    Intake/Output from previous day: 05/05 0701 - 05/06 0700 In: 364.7 [P.O.:150; I.V.:214.7] Out: 400 [Urine:400]  GENERAL:alert, no distress and comfortable SKIN: skin color, texture, turgor are normal, no rashes or significant lesions EYES: normal, Conjunctiva are pink and non-injected, sclera clear OROPHARYNX:no exudate, no erythema and lips, buccal mucosa, and tongue normal  NECK: supple, thyroid normal size, non-tender, without nodularity LYMPH:  no palpable lymphadenopathy in the cervical, axillary or inguinal LUNGS: Bibasilar rales HEART:  Irregularly irregular ABDOMEN:abdomen soft, non-tender and normal bowel sounds  NEURO: alert & oriented x 3 with fluent speech, no focal motor/sensory deficits  LABORATORY DATA:  I have reviewed the data as listed CMP Latest Ref Rng & Units 12/08/2019 12/07/2019 12/06/2019  Glucose 70 - 99 mg/dL 130(H) 202(H) 141(H)  BUN 8 - 23 mg/dL 36(H) 33(H) 23  Creatinine 0.44 - 1.00 mg/dL 1.82(H) 1.95(H) 1.66(H)  Sodium 135 - 145 mmol/L 134(L) 135 138  Potassium 3.5 - 5.1 mmol/L 4.6 4.5 3.6  Chloride 98 - 111 mmol/L 104 101 103  CO2 22 - 32 mmol/L 19(L) 22 23  Calcium 8.9 - 10.3 mg/dL 7.9(L) 9.0 9.0  Total Protein 6.5 - 8.1 g/dL 6.1(L) 6.7 6.5  Total Bilirubin 0.3 - 1.2 mg/dL 0.7 0.9 1.1  Alkaline Phos 38 - 126 U/L 98 115 101  AST 15 - 41 U/L _0 ALT 0 - 44 U/L _1 Lab Results  Component Value Date   WBC 9.3 12/08/2019   HGB 10.7 (L) 12/08/2019   HCT 35.3 (L) 12/08/2019   MCV 89.1 12/08/2019   PLT 267 12/08/2019   NEUTROABS 7.4 09/17/2015    NM Bone Scan Whole Body  Result Date: 12/06/2019 CLINICAL DATA:  T-spine compression fracture. EXAM: NUCLEAR MEDICINE WHOLE BODY BONE SCAN TECHNIQUE: Whole body anterior and posterior images were obtained approximately 3 hours after intravenous injection of radiopharmaceutical. RADIOPHARMACEUTICALS:  21.4 mCi Technetium-71mMDP IV COMPARISON:  CT 12/04/2019. FINDINGS: Bilateral renal function excretion. Punctate focus of increased activity noted over the right frontal skull. Punctate area of increased activity noted over a mid right anterior mid rib and possibly mid left anterior rib. Subtle punctate  areas of increased activity over the lower cervical/upper thoracic cannot be excluded. Punctate area of increased activity noted over the upper lumbar spine. The possibility of metastatic disease presenting in this fashion cannot be excluded. Increased activity noted over the medial right clavicle and manubrium. This may be degenerative. Mild increased  activity noted about the medial right knee joint space most likely degenerative. IMPRESSION: 1. Punctate areas of increased activity noted over the right frontal skull, mid right anterior rib and possibly mid left anterior rib. Subtle punctate areas of increased activity over the lower cervical/upper thoracic spine cannot be excluded. Punctate area of increased activity noted over the upper lumbar spine. The possibility metastatic disease presenting in this fashion cannot be excluded. 2. Increased activity noted over the medial right clavicle and manubrium. These changes may be degenerative. Punctate area of increased activity noted over the medial right knee joint space most likely degenerative. Plain film images of the above described areas may prove useful for further evaluation. Electronically Signed   By: Marcello Moores  Register   On: 12/06/2019 14:06   IR US Guide Bx Asp/Drain  Result Date: 12/08/2019 CLINICAL DATA:  Left breast mass.  Osseous metastatic disease. EXAM: ULTRASOUND GUIDED CORE BIOPSY OF LEFT BREAST MASS MEDICATIONS: Lidocaine 1% subcutaneous PROCEDURE: The procedure, risks, benefits, and alternatives were explained to the patient. Questions regarding the procedure were encouraged and answered. The patient understands and consents to the procedure. Hypoechoic upper left breast mass was localized corresponding to CT findings. Appropriate skin entry site was determined and marked. The operative field was prepped with Betadine in a sterile fashion, and a sterile drape was applied covering the operative field. A sterile gown and sterile gloves were used for the procedure. Local anesthesia was provided with 1% Lidocaine. Under real-time ultrasound guidance, a 17 gauge trocar needle was advanced to the margin of the lesion. Once needle tip position was confirmed, coaxial 18-gauge core biopsy samples were obtained, submitted in formalin to surgical pathology. The guide needle was removed. Postprocedure scan  shows no hemorrhage or other apparent complication. The patient tolerated the procedure well. COMPLICATIONS: None. FINDINGS: Hypoechoic upper left breast mass localized. Core biopsy samples obtained as above. IMPRESSION: 1. Technically successful core biopsy, left breast mass. Electronically Signed   By: Lucrezia Europe M.D.   On: 12/08/2019 09:34   DG Chest Port 1 View  Result Date: 12/04/2019 CLINICAL DATA:  Right-sided chest pain. EXAM: PORTABLE CHEST 1 VIEW COMPARISON:  02/10/2017 FINDINGS: Stable heart size and appearance of pacing/ICD device and left atrial appendage clip. Status post prior CABG. Lungs demonstrate evidence pulmonary interstitial edema. No focal airspace consolidation, pleural fluid or pneumothorax identified. IMPRESSION: Pulmonary interstitial edema. Electronically Signed   By: Aletta Edouard M.D.   On: 12/04/2019 14:17   CT ANGIO CHEST AORTA W/CM &/OR WO/CM  Result Date: 12/04/2019 CLINICAL DATA:  Chest pain and back pain. Aortic dissection suspected. EXAM: CT ANGIOGRAPHY CHEST WITH CONTRAST TECHNIQUE: Multidetector CT imaging of the chest was performed using the standard protocol during bolus administration of intravenous contrast. Multiplanar CT image reconstructions and MIPs were obtained to evaluate the vascular anatomy. CONTRAST:  127m OMNIPAQUE IOHEXOL 350 MG/ML SOLN COMPARISON:  None. FINDINGS: Cardiovascular: There is no evidence for a thoracic aortic dissection. There is no evidence for thoracic aortic aneurysm. Atherosclerotic changes are noted of the thoracic aorta. The main pulmonary artery is slightly dilated. There is no large centrally located pulmonary embolism. Detection of smaller pulmonary emboli is limited by technique. The heart  is enlarged. There is no significant pericardial effusion. The patient is status post prior CABG. Coronary artery calcifications are noted. Extensive collateral veins are noted throughout the chest wall which are likely secondary to occlusion  of the left brachiocephalic and subclavian vein secondary to the presence of a left-sided pacemaker. There is reflux of contrast in the IVC consistent with underlying cardiac dysfunction. Mediastinum/Nodes: --mildly prominent mediastinal and hilar lymph nodes are noted. For example there is a precarinal lymph node measuring approximately 1.4 cm. These are presumably reactive. --No axillary lymphadenopathy. --No supraclavicular lymphadenopathy. --Normal thyroid gland. --The esophagus is unremarkable Lungs/Pleura: There is a small right-sided pleural effusion. There is some mild bilateral interlobular septal thickening. There is a probable trace left-sided pleural effusion. There is no pneumothorax. Upper Abdomen: There may be cholelithiasis within the partially visualized gallbladder. Musculoskeletal: There is a questionable 4.1 x 2.4 cm left-sided breast mass (axial series 5, image 44). There are acute appearing compression fractures of the T4 and T5 vertebral bodies. There is a questionable fracture of the T3 vertebral body. There is an acute fracture of the C7 transverse process. There is a questionable fracture of the superior endplate of the C7 vertebral body. Review of the MIP images confirms the above findings. IMPRESSION: 1. There is no evidence for a thoracic aortic dissection. 2. There are acute appearing compression fractures of the T4 and T5 vertebral bodies. There is a questionable fracture of the T3 vertebral body. There is an acute fracture of the C7 transverse process. There is a questionable fracture through the superior endplate of the C7 vertebral body. 3. There is a questionable 4.1 x 2.4 cm left-sided breast mass. Correlation with physical exam and mammography is recommended. 4. Cardiomegaly with small right-sided pleural effusion and mild bilateral interlobular septal thickening, suggestive of interstitial edema. 5. Reflux of contrast in the IVC consistent with underlying cardiac dysfunction. 6.  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 7. Possible cholelithiasis within the partially visualized gallbladder. Aortic Atherosclerosis (ICD10-I70.0). Electronically Signed   By: Constance Holster M.D.   On: 12/04/2019 21:31   ECHOCARDIOGRAM COMPLETE  Result Date: 12/06/2019    ECHOCARDIOGRAM REPORT   Patient Name:   [name] Date of Exam: 12/06/2019 Medical Rec #:  [id number]        Height:       64.0 in Accession #:    [id number]       Weight:       176.1 lb Date of Birth:  08-Aug-1945        BSA:          1.853 m Patient Age:    23 years         BP:           100/68 mmHg Patient Gender: F                HR:           90 bpm. Exam Location:  Inpatient Procedure: 2D Echo Indications:    acute systolic chf 093.11  History:        Patient has prior history of Echocardiogram examinations, most                 recent 06/22/2019. Prior CABG and Defibrillator, chronic kidney  disease, Arrythmias:Atrial Fibrillation, Signs/Symptoms:Chest                 Pain; Risk Factors:Hypertension, Dyslipidemia and Diabetes.  Sonographer:    Johny Chess Referring Phys: [place]  1. Left ventricular ejection fraction, by estimation, is 35 to 40%. The left ventricle has moderately decreased function. The left ventricle demonstrates regional wall motion abnormalities (see scoring diagram/findings for description). There is severe asymmetric left ventricular hypertrophy. Left ventricular diastolic function could not be evaluated.  2. Right ventricular systolic function is low normal. The right ventricular size is normal. There is severely elevated pulmonary artery systolic pressure.  3. Left atrial size was severely dilated.  4. Right atrial size was mildly dilated.  5. The mitral valve is degenerative. Mild mitral valve  regurgitation. No evidence of mitral stenosis.  6. Tricuspid valve regurgitation is moderate.  7. The aortic valve is bicuspid. Aortic valve regurgitation is not visualized. Mild aortic valve sclerosis is present, with no evidence of aortic valve stenosis.  8. The inferior vena cava is dilated in size with <50% respiratory variability, suggesting right atrial pressure of 15 mmHg. Conclusion(s)/Recommendation(s): EF has decreased since prior echo. FINDINGS  Left Ventricle: Left ventricular ejection fraction, by estimation, is 35 to 40%. The left ventricle has moderately decreased function. The left ventricle demonstrates regional wall motion abnormalities. The left ventricular internal cavity size was normal in size. There is severe asymmetric left ventricular hypertrophy. Left ventricular diastolic function could not be evaluated due to atrial fibrillation. Left ventricular diastolic function could not be evaluated. Right Ventricle: The right ventricular size is normal. No increase in right ventricular wall thickness. Right ventricular systolic function is low normal. There is severely elevated pulmonary artery systolic pressure. The tricuspid regurgitant velocity is 3.75 m/s, and with an assumed right atrial pressure of 15 mmHg, the estimated right ventricular systolic pressure is 13.0 mmHg. Left Atrium: Left atrial size was severely dilated. Right Atrium: Right atrial size was mildly dilated. Pericardium: There is no evidence of pericardial effusion. Mitral Valve: The mitral valve is degenerative in appearance. There is moderate thickening of the mitral valve leaflet(s). There is moderate calcification of the mitral valve leaflet(s). Severe mitral annular calcification. Mild mitral valve regurgitation. No evidence of mitral valve stenosis. Tricuspid Valve: The tricuspid valve is normal in structure. Tricuspid valve regurgitation is moderate . No evidence of tricuspid stenosis. Aortic Valve: The aortic valve is  bicuspid. . There is mild thickening and moderate calcification of the aortic valve. Aortic valve regurgitation is not visualized. Mild aortic valve sclerosis is present, with no evidence of aortic valve stenosis. There is mild thickening of the aortic valve. There is moderate calcification of the aortic valve. Pulmonic Valve: The pulmonic valve was grossly normal. Pulmonic valve regurgitation is mild. No evidence of pulmonic stenosis. Aorta: The aortic root, ascending aorta and aortic arch are all structurally normal, with no evidence of dilitation or obstruction. Venous: The inferior vena cava is dilated in size with less than 50% respiratory variability, suggesting right atrial pressure of 15 mmHg. IAS/Shunts: No atrial level shunt detected by color flow Doppler. Additional Comments: A pacer wire is visualized in the right atrium and right ventricle.  LEFT VENTRICLE PLAX 2D LVIDd:         4.80 cm LVIDs:         3.90 cm LV PW:         1.10 cm LV IVS:        1.85 cm  LVOT diam:     1.90 cm LV SV:         40 LV SV Index:   22 LVOT Area:     2.84 cm  LEFT ATRIUM              Index       RIGHT ATRIUM           Index LA diam:        4.50 cm  2.43 cm/m  RA Area:     19.10 cm LA Vol (A2C):   96.2 ml  51.91 ml/m RA Volume:   49.50 ml  26.71 ml/m LA Vol (A4C):   94.1 ml  50.77 ml/m LA Biplane Vol: 101.0 ml 54.50 ml/m  AORTIC VALVE LVOT Vmax:   83.50 cm/s LVOT Vmean:  51.200 cm/s LVOT VTI:    0.141 m TRICUSPID VALVE TR Peak grad:   56.2 mmHg TR Vmax:        375.00 cm/s  SHUNTS Systemic VTI:  0.14 m Systemic Diam: 1.90 cm Buford Dresser MD Electronically signed by Buford Dresser MD Signature Date/Time: 12/06/2019/4:48:28 PM    Final    PATHOLOGY:  SURGICAL PATHOLOGY  CASE: [id number]  PATIENT: [name]  Surgical Pathology Report   Clinical History: breast mass, bone Mets; breast CA (nt)   FINAL MICROSCOPIC DIAGNOSIS:   A. BREAST, LEFT MASS, NEEDLE CORE BIOPSY:  - Invasive ductal  carcinoma.  - Ductal carcinoma in situ.  - See comment.   COMMENT:   The carcinoma appears grade 3. A breast prognostic profile will be  performed and the results reported separately.    GROSS DESCRIPTION:   Received in formalin (time in formalin and cold ischemia time not  provided) labeled the patient's name and "left breast" are 3 cores of  tan-yellow soft tissue averaging 2 cm, submitted in 1 cassette. (AK  12/07/2019)   ASSESSMENT AND PLAN: 1.  Left breast cancer 2.  Multiple compression fractures 3.  Anemia 4.  Acute congestive heart failure 5.  CAD status post CABG 6.  Hypertrophic cardiomyopathy 7.  Atrial fibrillation with RVR 8.  History of ventricular tachycardia as post ICD placement 9.  Diabetes mellitus 10.  History of anxiety and depression  -Biopsy results returned today and were discussed with the patient.  Will await breast prognostic profile before determining treatment plan. -Discussed with cardiology NP who was also at the bedside.  They currently have the patient scheduled for cardioversion tomorrow morning and thereafter, she will not be able to come off anticoagulation for at least 3 weeks.  I have sent a message to Dr. Marin Olp to determine if he wants any additional procedure such as a Port-A-Cath placement prior to this.  I will update cardiology once I know more. -Continue current pain medications.   LOS: 4 days   Mikey Bussing, DNP, AGPCNP-BC, AOCNP 12/08/19   ADDENDUM: The pathology has come back all ready.  Apparently, this is metastatic breast cancer.  I am not surprised by this at all.  Her CA 27.29 is 700.  I have to believe that this tumor is going to be estrogen positive given her age and the fact that she only has bone metastasis.  I would plan to treat her with Faslodex and ribociclib.  This is not chemotherapy.  She will not need to have a Port-A-Cath placed.  She can go back onto Eliquis.  If cardiology wants to cardiovert her, this  certainly would be okay from my  point of view.  We will have to weigh the actual prognostic markers for this cancer.  Again chances are significant that we are looking at a tumor that is estrogen positive.  If the tumor is HER-2 positive, I am not sure there is much we can do regarding this given her cardiomyopathy.  I would like to think that her outcome is going to be quite good with this breast cancer.  If the tumor is estrogen positive, I would think her prognosis should be in years given that this is only in her bones and really not all that widely metastatic.  I very much appreciate radiology's effort to get this biopsy done with [name] as an inpatient.  I realize the intricacies of doing a breast biopsy as an inpatient.  This really will help the management of [name].  Kayla Haw, MD  Psalm 28:6

## 2019-12-08 NOTE — H&P (View-Only) (Signed)
Advanced Heart Failure Rounding Note  PCP-Cardiologist: No primary care provider on file.   Subjective:    Oncology following for new diagnosis of metastatic breast cancer. CA 27.29 is over 700. Vertebral fractures likely pathologic.   12/07/19 S/P Left Breast biopsy   Echo repeated on 12/06/19 . EF is lower compared to prior exam. Now 35-40% (previously 45-50%). RV normal. RA pressure 15. RVSP severely elevated at 43mmHg.   Remains on amio drip 30 mg/hour + heparin drip.   Yesterday given 40 mg IV lasix. Weight unchanged. \  Complaining of nausea. Denies SOB.      Objective:   Weight Range: 82.1 kg Body mass index is 31.05 kg/m.   Vital Signs:   Temp:  [98 F (36.7 C)-98.2 F (36.8 C)] 98.1 F (36.7 C) (05/06 0500) Pulse Rate:  [54-104] 54 (05/06 0500) Resp:  [17-18] 18 (05/05 2054) BP: (102-138)/(70-92) 138/92 (05/06 0500) SpO2:  [92 %-99 %] 92 % (05/06 0500) Weight:  [82.1 kg] 82.1 kg (05/06 0500) Last BM Date: 12/04/19(senokot ,metamucil given)  Weight change: Filed Weights   12/06/19 0400 12/07/19 0430 12/08/19 0500  Weight: 79.9 kg 81.9 kg 82.1 kg    Intake/Output:   Intake/Output Summary (Last 24 hours) at 12/08/2019 0812 Last data filed at 12/07/2019 2000 Gross per 24 hour  Intake 214.67 ml  Output 400 ml  Net -185.33 ml      Physical Exam    General:  Tearful. Sitting on the side of the bed. No resp difficulty HEENT: normal Neck: supple. TR waves JVP 11-12. Carotids 2+ bilat; no bruits. No lymphadenopathy or thryomegaly appreciated. Cor: PMI nondisplaced. Irregular rate & rhythm. No rubs, gallops or murmurs. Lungs: clear Abdomen: soft, nontender, nondistended. No hepatosplenomegaly. No bruits or masses. Good bowel sounds. Extremities: no cyanosis, clubbing, rash, edema Neuro: alert & orientedx3, cranial nerves grossly intact. moves all 4 extremities w/o difficulty. Affect pleasant  Telemetry   A fib 100s personally reviewed.    EKG    No  new EKG to review today. EKG from 5/2 reviewed QT/QTc 310/429 ms  Labs    CBC Recent Labs    12/07/19 0512 12/08/19 0307  WBC 9.5 9.3  HGB 11.1* 10.7*  HCT 36.6 35.3*  MCV 89.3 89.1  PLT 337 [id number]   Basic Metabolic Panel Recent Labs    12/07/19 0512 12/08/19 0307  NA 135 134*  K 4.5 4.6  CL 101 104  CO2 22 19*  GLUCOSE 202* 130*  BUN 33* 36*  CREATININE 1.95* 1.82*  CALCIUM 9.0 7.9*  MG 2.0 1.9   Liver Function Tests Recent Labs    12/07/19 0512 12/08/19 0307  AST 28 27  ALT 25 21  ALKPHOS 115 98  BILITOT 0.9 0.7  PROT 6.7 6.1*  ALBUMIN 3.6 3.2*   No results for input(s): LIPASE, AMYLASE in the last 72 hours. Cardiac Enzymes No results for input(s): CKTOTAL, CKMB, CKMBINDEX, TROPONINI in the last 72 hours.  BNP: BNP (last 3 results) Recent Labs    12/04/19 1324  BNP 581.1*    ProBNP (last 3 results) No results for input(s): PROBNP in the last 8760 hours.   D-Dimer No results for input(s): DDIMER in the last 72 hours. Hemoglobin A1C No results for input(s): HGBA1C in the last 72 hours. Fasting Lipid Panel No results for input(s): CHOL, HDL, LDLCALC, TRIG, CHOLHDL, LDLDIRECT in the last 72 hours. Thyroid Function Tests No results for input(s): TSH, T4TOTAL, T3FREE, THYROIDAB in the last  72 hours.  Invalid input(s): FREET3  Other results:   Imaging    No results found.   Medications:     Scheduled Medications: . feeding supplement (ENSURE ENLIVE)  237 mL Oral BID BM  . fentaNYL  1 patch Transdermal Q72H  . insulin aspart  0-15 Units Subcutaneous TID WC  . insulin aspart  0-5 Units Subcutaneous QHS  . metoprolol succinate  25 mg Oral Daily  . Vitamin D (Ergocalciferol)  50,000 Units Oral Q7 days    Infusions: . amiodarone 30 mg/hr (12/08/19 0519)  . heparin 950 Units/hr (12/08/19 0518)    PRN Medications: acetaminophen **OR** acetaminophen, HYDROcodone-acetaminophen, lidocaine (PF), morphine injection, ondansetron (ZOFRAN) IV,  oxyCODONE, polyethylene glycol, senna-docusate    Assessment/Plan   1. 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 Prolonged post-hospital course with low output HF despite relatively preserved EF. RHC suggestive of primarily RV failure. -EF 45-50% post CABG 5/18 - Technetium pyrophosphate scan negative for amyloid. - echo 06/2019 showed mildly reduced LVEF 45-50% w/ Severe asymetric septal hypertrophy no SAM or LVOT gradient. RV ok  - Now admitted w/ NYHA III symptoms + mild volume overload. CXR w/ interstitial edema + right sided pleural effusion, BNP 581. TSH normal. Device interrogation 5/2 showed CorVue c/w mild volume overload, in the setting of persistent Afib (since 10/07/19). -  - Echo 12/06/19 EF 35-40% - She will likely need to start chemo for metastatic breast CA. Depending on therapies selected, may need surveillance echos q 3-6 months - Received IV lasix 12/07/19 but had poor results. Creatinine 1.9>1.8   - Mild volume overload. Give another 40 mg IV lasix x1.  - Hold off on  ARB/ARNi retrial + spiro given AKI.  -I am stopping metoprolol because she refuses to take due to previous side effects.   - unable to start digoxin currently due to AKI   2. Atrial Fibrillation  - prior h/o Afib/ Flutter w/ previous cardioversions + MAZE and LAA clip in 2018 at time of CABG. On chronic a/c w/ Eliquis PTA (no missed doses in the last 30 days) - now back in AFib. Device interrogation shows that she has been in persistent afib since 10/07/19 - TSH normal  - recurrence also in the setting of a/c systolic + diastolic HF - She refuses  blocker   Avoid use of Cardizem given LV dysfunction.  - Continue amio 30 mg per hour.  -Continue heparin drip. No eliquis for now.  - Needs cardioversion but will need hold off until we know about future procedures.   3. Apical hypertrophic cardiomyopathy  - Echo 06/2019 showed mildly reduced LVEF 45-50% w/  Severe asymetric septal hypertrophy no SAM or LVOT gradient - Echo repeated 5/4 showed severe asymmetric left ventricular hypertrophy. LVOT diameter 1.9 cm  - denies syncope - has ICD. No VT detection on device interrogation   4. CAD - s/p CABG x 4 in 2018 - no anginal symptoms.  - hs troponin normal x 2, 15>>16 - refuses ? blocker - resume home rosuvastatin   5. Left Breast Mass, likely Metastatic Breast Cancer  - incidental finding noted on Chest CT 5/2, measuring 4.1 x 2.4 cm  - She has not had routine mammograms. Her mother had breast cancer, diagnosed in her 1s.  - CA 29.27 over 700. Oncology following  - S/P L Breast Biopsty 12/07/19   -  suspected to have bone mets   6.  Multilevel Vertebral Compression Fractures, Likely Cancer Metastases   -  found to have acute appearing, multilevel vertebral  compression fractures (C7 vertebral body/ transfer process, T4 and T5 vertebral bodies, ? T3 vertebral body). No recent falls/trauma  - suspect pathologic bone fractures/ bone mets from breast CA - Oncology added Zometa  - Oncology and IR to decide on potential hypoplasty   7. AKI - Bump in SCr from 0.98>>1.66>>1.95>>1.8, suspect contrast nephropathy from chest CT w/ contrast 5/2 - BMET in am.    Length of Stay: [place], NP  12/08/2019, 8:12 AM  Advanced Heart Failure Team Pager [phone] (M-F; 7a - 4p)  Please contact [facility] for night-coverage after hours (4p -7a ) and weekends on [email or web address]  Patient seen and examined with the above-signed Advanced Practice Provider and/or Housestaff. I personally reviewed laboratory data, imaging studies and relevant notes. I independently examined the patient and formulated the important aspects of the plan. I have edited the note to reflect any of my changes or salient points. I have personally discussed the plan with the patient and/or family.  She remains in AF but now with improved rate control on IV amio. Breast pathology  back with ductal CA. (receptors pending)  Continues with nausea. Also remains volume overloaded with poor response to IV lasix.   General:  Weak appearing. No resp difficulty HEENT: normal Neck: supple. JVP to jaw . Carotids 2+ bilat; no bruits. No lymphadenopathy or thryomegaly appreciated. Cor: PMI nondisplaced.Irregular rate & rhythm. No rubs, gallops or murmurs. Lungs: clear Abdomen: soft, nontender, nondistended. No hepatosplenomegaly. No bruits or masses. Good bowel sounds. Extremities: no cyanosis, clubbing, rash, 1+ edema Neuro: alert & orientedx3, cranial nerves grossly intact. moves all 4 extremities w/o difficulty. Affect pleasant  Suspect nausea may be related to fentanyl patch as she has tolerated amio previously. Will remove and reassess. We discussed results of breast bx.   Will plan TEE/DC-CV tomorrow in an attempt to get her back in nSR and promote LV improvement. If we can get EF back up into at least 40-45% range I would not be opposed to treating with Herceptin.   Will give IV lasix again tonight,. Can switch back to NOAC.   Glori Bickers, MD  11:05 PM

## 2019-12-08 NOTE — Progress Notes (Signed)
PROGRESS NOTE    [name]  [id number] DOB: [birth year] DOA: 12/04/2019 PCP: Maurice Small, MD   Brief Narrative:  74 year old with history of paroxysmal A. fib on Eliquis, systolic CHF status post CABG, ventricular tachycardia status post ICD placement 1113, HTN, hypertrophic cardiomyopathy, HLD who presented with chest pain and back pain ongoing for 1 month.  In ED noted to be in atrial fibrillation with RVR, CHF exacerbation.  Cardiology and hospitalist team consulted for admission and further care.  Diagnosed with left breast mass with concerns for possible metastases.  Difficulty getting patient formal diagnosis.  Oncology working to see if something can be done inpatient.  Bone scan completed 12/06/2019 and biopsy results pending.   Assessment & Plan:   Principal Problem:   Chest pain Active Problems:   Diabetes mellitus type 2, noninsulin dependent ([name])   Atrial fibrillation with RVR ([name])   Systolic CHF ([name])   CAD (coronary artery disease)   HLD (hyperlipidemia)   Acute on chronic congestive heart failure ([name])   Paroxysmal atrial fibrillation ([name])   CKD (chronic kidney disease) stage 3, GFR 30-59 ml/min   S/P CABG x 5   HTN (hypertension)   Breast cancer metastasized to bone ([place])   Goals of care, counseling/discussion   Acute hypoxic respiratory failure in the setting of acutely decompensated congestive heart failure with reduced ejection fraction, 45%, class III, resolved Hypertrophic cardiomyopathy CAD status post CABG - Diuretics held due to acute kidney injury as below; cardiology following - Strict input and output, daily weight - minimal weight gain as per recordings: - Ambulatory oxygen screening daily -at rest on room air  Intake/Output Summary (Last 24 hours) at 12/08/2019 0718 Last data filed at 12/07/2019 2000 Gross per 24 hour  Intake 364.67 ml  Output 400 ml  Net -35.33 ml   Atypical chest pain and upper back pain in the setting of acute C7  fracture - This is likely referred pain from her thoracic spine fracture, not cardiogenic in nature. - Concurrent left breast mass, cardiomegaly with pleural effusion/interstitial edema  Compression fracture of thoracic vertebrae and acute C7 transverse process - IR consulted, bone scan has been completed - Fractures appear to be chronic in nature, not candidate for vertebral augmentation per IR - Zometa ordered by Onc - For C7 transverse spine fracture patient can just use soft collar 4-6 weeks and pain control- Curbside neurosurgery.  Left-sided breast mass, upper quadrant - Concerning for breast cancer with metastatic disease given family history/imaging.   - Oncology team working on getting biopsy of mass while inpatient; she does not require mammogram first per oncology given metastatic designation  Acute kidney injury on CKD stage II, worsening in the setting of above - Baseline creatinine 0.9 - likely secondary to diuresis and contrast - Continue to follow creatinine, if continues to worsen will attempt fluid trial Lab Results  Component Value Date   CREATININE 1.82 (H) 12/08/2019   CREATININE 1.95 (H) 12/07/2019   CREATININE 1.66 (H) 12/06/2019   Atrial fibrillation with RVR, rate controlled - Continue heparin - likely transition back to eliquis per cardiology - Possible cardioversion in the next 24 to 48 hours - Amiodarone drip continues, defer to cardiology to transition back to p.o. rate control; somewhat difficult to control given patient's beta-blocker intolerance and inability to tolerate other medications due to heart failure history as above   Vitamin D deficiency -Vitamin D supplements 50,000 units every 7 days.  History of ventricular tachycardia status post  ICD placement -Continues on telemetry, monitor closely  Diabetes mellitus type 2 -Sliding scale and Accu-Chek, hemoglobin A1c 7.7  Hyperlipidemia - Continue statin, LDL at goal-61  History of  anxiety/depression - Continue home meds  Unintentional weight loss -TSH- Neg -HIV- pending  DVT prophylaxis: Heparin drip Code Status: Full code Family Communication: None  Status is: Inpatient  Remains inpatient appropriate because:Ongoing diagnostic testing needed not appropriate for outpatient work up   Dispo: The patient is from: Home              Anticipated d/c is to: Home              Anticipated d/c date is: 2 days              Patient currently undergoing work-up for breast mass and multiple compression fracture complicated by A. fib with RVR and CHF exacerbation.   Subjective: No acute issues or events overnight, patient still somewhat fatigued and admits to ongoing nausea early this morning; otherwise chest pain, shortness of breath, vomiting, diarrhea, constipation, headache, fevers, chills.  Examination: Constitutional: Not in acute distress, on room air Respiratory: Bibasilar crackles Cardiovascular: Irregularly irregular Abdomen: Nontender nondistended good bowel sounds Musculoskeletal: No edema noted Skin: No rashes seen Neurologic: CN 2-12 grossly intact.  And nonfocal Psychiatric: Overall anxious otherwise alert awake oriented.  Objective: Vitals:   12/07/19 1100 12/07/19 1700 12/07/19 2054 12/08/19 0500  BP: 120/73 127/86 102/70 (!) (P) 138/92  Pulse: 91 97 (!) 104 (!) (P) 54  Resp: 17  18   Temp: 98 F (36.7 C) 98.1 F (36.7 C) 98.2 F (36.8 C) (P) 98.1 F (36.7 C)  TempSrc: Oral Oral Oral (P) Oral  SpO2: 96% 99% 92% (P) 92%  Weight:      Height:        Intake/Output Summary (Last 24 hours) at 12/08/2019 0718 Last data filed at 12/07/2019 2000 Gross per 24 hour  Intake 364.67 ml  Output 400 ml  Net -35.33 ml   Filed Weights   12/05/19 0447 12/06/19 0400 12/07/19 0430  Weight: 79.6 kg 79.9 kg 81.9 kg     Data Reviewed:   CBC: Recent Labs  Lab 12/04/19 1324 12/05/19 0442 12/06/19 0402 12/07/19 0512 12/08/19 0307  WBC 8.6 8.3 7.8  9.5 9.3  HGB 11.2* 11.1* 11.0* 11.1* 10.7*  HCT 37.6 37.1 36.8 36.6 35.3*  MCV 88.7 88.5 88.7 89.3 89.1  PLT 310 328 340 337 [id number]   Basic Metabolic Panel: Recent Labs  Lab 12/04/19 1324 12/04/19 1637 12/05/19 0442 12/06/19 0402 12/07/19 0512 12/08/19 0307  NA 138  --  142 138 135 134*  K 3.5  --  3.8 3.6 4.5 4.6  CL 106  --  108 103 101 104  CO2 20*  --  20* 23 22 19*  GLUCOSE 187*  --  134* 141* 202* 130*  BUN 20  --  18 23 33* 36*  CREATININE 1.06*  --  0.98 1.66* 1.95* 1.82*  CALCIUM 9.0  --  9.1 9.0 9.0 7.9*  MG  --  1.7  --  1.7 2.0 1.9  PHOS  --  4.4  --   --   --   --    GFR: Estimated Creatinine Clearance: 29.4 mL/min (A) (by C-G formula based on SCr of 1.82 mg/dL (H)). Liver Function Tests: Recent Labs  Lab 12/05/19 0442 12/06/19 0402 12/07/19 0512 12/08/19 0307  AST 33 28 28 27   ALT 28 25 25  21  ALKPHOS 103 101 115 98  BILITOT 1.2 1.1 0.9 0.7  PROT 6.7 6.5 6.7 6.1*  ALBUMIN 3.3* 3.3* 3.6 3.2*   No results for input(s): LIPASE, AMYLASE in the last 168 hours. No results for input(s): AMMONIA in the last 168 hours. Coagulation Profile: No results for input(s): INR, PROTIME in the last 168 hours. Cardiac Enzymes: No results for input(s): CKTOTAL, CKMB, CKMBINDEX, TROPONINI in the last 168 hours. BNP (last 3 results) No results for input(s): PROBNP in the last 8760 hours. HbA1C: No results for input(s): HGBA1C in the last 72 hours. CBG: Recent Labs  Lab 12/07/19 0757 12/07/19 1140 12/07/19 1635 12/07/19 2143 12/08/19 0646  GLUCAP 172* 142* 136* 164* 144*   Lipid Profile: No results for input(s): CHOL, HDL, LDLCALC, TRIG, CHOLHDL, LDLDIRECT in the last 72 hours. Thyroid Function Tests: No results for input(s): TSH, T4TOTAL, FREET4, T3FREE, THYROIDAB in the last 72 hours. Anemia Panel: No results for input(s): VITAMINB12, FOLATE, FERRITIN, TIBC, IRON, RETICCTPCT in the last 72 hours. Sepsis Labs: No results for input(s): PROCALCITON, LATICACIDVEN  in the last 168 hours.  Recent Results (from the past 240 hour(s))  Respiratory Panel by RT PCR (Flu A&B, Covid) - Nasopharyngeal Swab     Status: None   Collection Time: 12/04/19  2:06 PM   Specimen: Nasopharyngeal Swab  Result Value Ref Range Status   SARS Coronavirus 2 by RT PCR NEGATIVE NEGATIVE Final    Comment: (NOTE) SARS-CoV-2 target nucleic acids are NOT DETECTED. The SARS-CoV-2 RNA is generally detectable in upper respiratoy specimens during the acute phase of infection. The lowest concentration of SARS-CoV-2 viral copies this assay can detect is 131 copies/mL. A negative result does not preclude SARS-Cov-2 infection and should not be used as the sole basis for treatment or other patient management decisions. A negative result may occur with  improper specimen collection/handling, submission of specimen other than nasopharyngeal swab, presence of viral mutation(s) within the areas targeted by this assay, and inadequate number of viral copies (<131 copies/mL). A negative result must be combined with clinical observations, patient history, and epidemiological information. The expected result is Negative. Fact Sheet for Patients:  PinkCheek.be Fact Sheet for Healthcare Providers:  GravelBags.it This test is not yet ap proved or cleared by the [place] FDA and  has been authorized for detection and/or diagnosis of SARS-CoV-2 by FDA under an Emergency Use Authorization (EUA). This EUA will remain  in effect (meaning this test can be used) for the duration of the COVID-19 declaration under Section 564(b)(1) of the Act, 21 U.S.C. section 360bbb-3(b)(1), unless the authorization is terminated or revoked sooner.    Influenza A by PCR NEGATIVE NEGATIVE Final   Influenza B by PCR NEGATIVE NEGATIVE Final    Comment: (NOTE) The Xpert Xpress SARS-CoV-2/FLU/RSV assay is intended as an aid in  the diagnosis of influenza  from Nasopharyngeal swab specimens and  should not be used as a sole basis for treatment. Nasal washings and  aspirates are unacceptable for Xpert Xpress SARS-CoV-2/FLU/RSV  testing. Fact Sheet for Patients: PinkCheek.be Fact Sheet for Healthcare Providers: GravelBags.it This test is not yet approved or cleared by the [place] FDA and  has been authorized for detection and/or diagnosis of SARS-CoV-2 by  FDA under an Emergency Use Authorization (EUA). This EUA will remain  in effect (meaning this test can be used) for the duration of the  Covid-19 declaration under Section 564(b)(1) of the Act, 21  U.S.C. section 360bbb-3(b)(1), unless the authorization is  terminated or revoked. Performed at [facility], [place] [address] [place], [place] [address]          Radiology Studies: NM Bone Scan Whole Body  Result Date: 12/06/2019 CLINICAL DATA:  T-spine compression fracture. EXAM: NUCLEAR MEDICINE WHOLE BODY BONE SCAN TECHNIQUE: Whole body anterior and posterior images were obtained approximately 3 hours after intravenous injection of radiopharmaceutical. RADIOPHARMACEUTICALS:  21.4 mCi Technetium-18m MDP IV COMPARISON:  CT 12/04/2019. FINDINGS: Bilateral renal function excretion. Punctate focus of increased activity noted over the right frontal skull. Punctate area of increased activity noted over a mid right anterior mid rib and possibly mid left anterior rib. Subtle punctate areas of increased activity over the lower cervical/upper thoracic cannot be excluded. Punctate area of increased activity noted over the upper lumbar spine. The possibility of metastatic disease presenting in this fashion cannot be excluded. Increased activity noted over the medial right clavicle and manubrium. This may be degenerative. Mild increased activity noted about the medial right knee joint space most likely degenerative. IMPRESSION: 1. Punctate  areas of increased activity noted over the right frontal skull, mid right anterior rib and possibly mid left anterior rib. Subtle punctate areas of increased activity over the lower cervical/upper thoracic spine cannot be excluded. Punctate area of increased activity noted over the upper lumbar spine. The possibility metastatic disease presenting in this fashion cannot be excluded. 2. Increased activity noted over the medial right clavicle and manubrium. These changes may be degenerative. Punctate area of increased activity noted over the medial right knee joint space most likely degenerative. Plain film images of the above described areas may prove useful for further evaluation. Electronically Signed   By: Marcello Moores  Register   On: 12/06/2019 14:06   ECHOCARDIOGRAM COMPLETE  Result Date: 12/06/2019    ECHOCARDIOGRAM REPORT   Patient Name:   [name] Date of Exam: 12/06/2019 Medical Rec #:  [id number]        Height:       64.0 in Accession #:    [id number]       Weight:       176.1 lb Date of Birth:  Dec 13, 1945        BSA:          1.853 m Patient Age:    5 years         BP:           100/68 mmHg Patient Gender: F                HR:           90 bpm. Exam Location:  Inpatient Procedure: 2D Echo Indications:    acute systolic chf [id number]  History:        Patient has prior history of Echocardiogram examinations, most                 recent 06/22/2019. Prior CABG and Defibrillator, chronic kidney                 disease, Arrythmias:Atrial Fibrillation, Signs/Symptoms:Chest                 Pain; Risk Factors:Hypertension, Dyslipidemia and Diabetes.  Sonographer:    Johny Chess Referring Phys: [place]  1. Left ventricular ejection fraction, by estimation, is 35 to 40%. The left ventricle has moderately decreased function. The left ventricle demonstrates regional wall motion abnormalities (see scoring diagram/findings for description). There is severe asymmetric left ventricular  hypertrophy.  Left ventricular diastolic function could not be evaluated.  2. Right ventricular systolic function is low normal. The right ventricular size is normal. There is severely elevated pulmonary artery systolic pressure.  3. Left atrial size was severely dilated.  4. Right atrial size was mildly dilated.  5. The mitral valve is degenerative. Mild mitral valve regurgitation. No evidence of mitral stenosis.  6. Tricuspid valve regurgitation is moderate.  7. The aortic valve is bicuspid. Aortic valve regurgitation is not visualized. Mild aortic valve sclerosis is present, with no evidence of aortic valve stenosis.  8. The inferior vena cava is dilated in size with <50% respiratory variability, suggesting right atrial pressure of 15 mmHg. Conclusion(s)/Recommendation(s): EF has decreased since prior echo. FINDINGS  Left Ventricle: Left ventricular ejection fraction, by estimation, is 35 to 40%. The left ventricle has moderately decreased function. The left ventricle demonstrates regional wall motion abnormalities. The left ventricular internal cavity size was normal in size. There is severe asymmetric left ventricular hypertrophy. Left ventricular diastolic function could not be evaluated due to atrial fibrillation. Left ventricular diastolic function could not be evaluated. Right Ventricle: The right ventricular size is normal. No increase in right ventricular wall thickness. Right ventricular systolic function is low normal. There is severely elevated pulmonary artery systolic pressure. The tricuspid regurgitant velocity is 3.75 m/s, and with an assumed right atrial pressure of 15 mmHg, the estimated right ventricular systolic pressure is [id number] mmHg. Left Atrium: Left atrial size was severely dilated. Right Atrium: Right atrial size was mildly dilated. Pericardium: There is no evidence of pericardial effusion. Mitral Valve: The mitral valve is degenerative in appearance. There is moderate thickening of the  mitral valve leaflet(s). There is moderate calcification of the mitral valve leaflet(s). Severe mitral annular calcification. Mild mitral valve regurgitation. No evidence of mitral valve stenosis. Tricuspid Valve: The tricuspid valve is normal in structure. Tricuspid valve regurgitation is moderate . No evidence of tricuspid stenosis. Aortic Valve: The aortic valve is bicuspid. . There is mild thickening and moderate calcification of the aortic valve. Aortic valve regurgitation is not visualized. Mild aortic valve sclerosis is present, with no evidence of aortic valve stenosis. There is mild thickening of the aortic valve. There is moderate calcification of the aortic valve. Pulmonic Valve: The pulmonic valve was grossly normal. Pulmonic valve regurgitation is mild. No evidence of pulmonic stenosis. Aorta: The aortic root, ascending aorta and aortic arch are all structurally normal, with no evidence of dilitation or obstruction. Venous: The inferior vena cava is dilated in size with less than 50% respiratory variability, suggesting right atrial pressure of 15 mmHg. IAS/Shunts: No atrial level shunt detected by color flow Doppler. Additional Comments: A pacer wire is visualized in the right atrium and right ventricle.  LEFT VENTRICLE PLAX 2D LVIDd:         4.80 cm LVIDs:         3.90 cm LV PW:         1.10 cm LV IVS:        1.85 cm LVOT diam:     1.90 cm LV SV:         40 LV SV Index:   22 LVOT Area:     2.84 cm  LEFT ATRIUM              Index       RIGHT ATRIUM           Index LA diam:        4.50 cm  2.43 cm/m  RA Area:     19.10 cm LA Vol (A2C):   96.2 ml  51.91 ml/m RA Volume:   49.50 ml  26.71 ml/m LA Vol (A4C):   94.1 ml  50.77 ml/m LA Biplane Vol: 101.0 ml 54.50 ml/m  AORTIC VALVE LVOT Vmax:   83.50 cm/s LVOT Vmean:  51.200 cm/s LVOT VTI:    0.141 m TRICUSPID VALVE TR Peak grad:   56.2 mmHg TR Vmax:        375.00 cm/s  SHUNTS Systemic VTI:  0.14 m Systemic Diam: 1.90 cm Buford Dresser MD  Electronically signed by Buford Dresser MD Signature Date/Time: 12/06/2019/4:48:28 PM    Final    Scheduled Meds: . feeding supplement (ENSURE ENLIVE)  237 mL Oral BID BM  . fentaNYL  1 patch Transdermal Q72H  . insulin aspart  0-15 Units Subcutaneous TID WC  . insulin aspart  0-5 Units Subcutaneous QHS  . metoprolol succinate  25 mg Oral Daily  . Vitamin D (Ergocalciferol)  50,000 Units Oral Q7 days   Continuous Infusions: . amiodarone 30 mg/hr (12/08/19 0519)  . heparin 950 Units/hr (12/08/19 0518)     LOS: 4 days   Time spent= 35 mins  Little Ishikawa, DO [facility]  If 7PM-7AM, please contact night-coverage  12/08/2019, 7:18 AM

## 2019-12-08 NOTE — Progress Notes (Signed)
[facility] for Heparin Indication: atrial fibrillation  Allergies  Allergen Reactions  . Levaquin [Levofloxacin] Other (See Comments)    Suicidal thoughts, extreme anxiety, panic attack  . Phenergan [Promethazine Hcl] Other (See Comments)    Restless    Patient Measurements: Height: 5\' 4"  (162.6 cm) Weight: 81.9 kg (180 lb 8 oz) IBW/kg (Calculated) : 54.7 Heparin Dosing Weight: 70.9 kg  Vital Signs: Temp: 98.2 F (36.8 C) (05/05 2054) Temp Source: Oral (05/05 2054) BP: 102/70 (05/05 2054) Pulse Rate: 104 (05/05 2054)  Labs: Recent Labs    12/06/19 0402 12/06/19 1042 12/07/19 0512 12/07/19 1745 12/08/19 0307  HGB 11.0*  --  11.1*  --  10.7*  HCT 36.8  --  36.6  --  35.3*  PLT 340  --  337  --  267  APTT 91*   < > 114* 34 63*  HEPARINUNFRC >2.20*  --  >2.20*  --   --   CREATININE 1.66*  --  1.95*  --  1.82*   < > = values in this interval not displayed.    Estimated Creatinine Clearance: 29.4 mL/min (A) (by C-G formula based on SCr of 1.82 mg/dL (H)).  Assessment: 74 y.o. female with h/o Afib, Eliquis on hold, for heparin  Goal of Therapy:  Heparin level 0.3-0.7 units/ml aPTT 66-102 seconds Monitor platelets by anticoagulation protocol: Yes   Plan:  Increase Heparin 950 units/hr  Phillis Knack, PharmD, BCPS  12/08/2019 5:02 AM

## 2019-12-08 NOTE — Progress Notes (Signed)
  Mobility Specialist Criteria Algorithm Info.  SATURATION QUALIFICATIONS: (This note is used to comply with regulatory documentation for home oxygen)  Patient Saturations on Room Air at Rest = 94% Patient Saturations on Room Air while Ambulating = 92% Patient Saturations on n/a Liters of oxygen while Ambulating = n/a% Please briefly explain why patient needs home oxygen:  Mobility Team:  [facility] elevated:Self regulated Activity: Ambulated in hall;Dangled on edge of bed Range of motion: Active;All extremities Level of assistance: Standby assist, set-up cues, supervision of patient - no hands on (Min G) Assistive device: Front wheel walker Minutes sitting in chair:  Minutes stood: 5 minutes Minutes ambulated: 5 minutes Distance ambulated (ft): 60 ft Mobility response: Tolerated fair (See PT note) Bed Position: Semi-fowlers   12/08/2019 2:23 PM

## 2019-12-09 ENCOUNTER — Inpatient Hospital Stay (HOSPITAL_COMMUNITY): Payer: PPO | Admitting: Certified Registered"

## 2019-12-09 ENCOUNTER — Inpatient Hospital Stay (HOSPITAL_COMMUNITY): Payer: PPO

## 2019-12-09 ENCOUNTER — Encounter (HOSPITAL_COMMUNITY): Payer: Self-pay | Admitting: Internal Medicine

## 2019-12-09 ENCOUNTER — Encounter (HOSPITAL_COMMUNITY)
Admission: EM | Disposition: A | Discharge status: Home Health | Payer: Self-pay | Source: Home / Self Care | Attending: Internal Medicine

## 2019-12-09 DIAGNOSIS — C50919 Malignant neoplasm of unspecified site of unspecified female breast: Secondary | ICD-10-CM

## 2019-12-09 HISTORY — PX: TEE WITHOUT CARDIOVERSION: SHX5443

## 2019-12-09 HISTORY — PX: CARDIOVERSION: SHX1299

## 2019-12-09 LAB — CBC
HCT: 36.1 % (ref 36.0–46.0)
Hemoglobin: 10.9 g/dL — ABNORMAL LOW (ref 12.0–15.0)
MCH: 26.9 pg (ref 26.0–34.0)
MCHC: 30.2 g/dL (ref 30.0–36.0)
MCV: 89.1 fL (ref 80.0–100.0)
Platelets: 294 10*3/uL (ref 150–400)
RBC: 4.05 MIL/uL (ref 3.87–5.11)
RDW: 17.6 % — ABNORMAL HIGH (ref 11.5–15.5)
WBC: 12.2 10*3/uL — ABNORMAL HIGH (ref 4.0–10.5)
nRBC: 0 % (ref 0.0–0.2)

## 2019-12-09 LAB — BASIC METABOLIC PANEL
Anion gap: 11 (ref 5–15)
BUN: 43 mg/dL — ABNORMAL HIGH (ref 8–23)
CO2: 21 mmol/L — ABNORMAL LOW (ref 22–32)
Calcium: 7.7 mg/dL — ABNORMAL LOW (ref 8.9–10.3)
Chloride: 101 mmol/L (ref 98–111)
Creatinine, Ser: 2.08 mg/dL — ABNORMAL HIGH (ref 0.44–1.00)
GFR calc Af Amer: 27 mL/min — ABNORMAL LOW (ref >60)
GFR calc non Af Amer: 23 mL/min — ABNORMAL LOW (ref >60)
Glucose, Bld: 109 mg/dL — ABNORMAL HIGH (ref 70–99)
Potassium: 4.2 mmol/L (ref 3.5–5.1)
Sodium: 133 mmol/L — ABNORMAL LOW (ref 135–145)

## 2019-12-09 LAB — PROTIME-INR
INR: 1.5 — ABNORMAL HIGH (ref 0.8–1.2)
Prothrombin Time: 17.3 seconds — ABNORMAL HIGH (ref 11.4–15.2)

## 2019-12-09 LAB — GLUCOSE, CAPILLARY
Glucose-Capillary: 99 mg/dL (ref 70–99)
Glucose-Capillary: 123 mg/dL — ABNORMAL HIGH (ref 70–99)
Glucose-Capillary: 107 mg/dL — ABNORMAL HIGH (ref 70–99)
Glucose-Capillary: 200 mg/dL — ABNORMAL HIGH (ref 70–99)
Glucose-Capillary: 184 mg/dL — ABNORMAL HIGH (ref 70–99)

## 2019-12-09 LAB — MAGNESIUM: Magnesium: 2.1 mg/dL (ref 1.7–2.4)

## 2019-12-09 LAB — SURGICAL PATHOLOGY

## 2019-12-09 SURGERY — ECHOCARDIOGRAM, TRANSESOPHAGEAL
Anesthesia: General

## 2019-12-09 MED ORDER — LIDOCAINE HCL (CARDIAC) PF 100 MG/5ML IV SOSY
PREFILLED_SYRINGE | INTRAVENOUS | Status: DC | PRN
  Administered 2019-12-09: 40 mg via INTRAVENOUS

## 2019-12-09 MED ORDER — SODIUM CHLORIDE 0.9 % IV SOLN: INTRAVENOUS | Status: DC

## 2019-12-09 MED ORDER — MAGNESIUM HYDROXIDE 400 MG/5ML PO SUSP
30.0000 mL | Freq: Every day | ORAL | Status: DC | PRN
  Administered 2019-12-09: 30 mL via ORAL
  Filled 2019-12-09 (×2): qty 30

## 2019-12-09 MED ORDER — PROPOFOL 500 MG/50ML IV EMUL
INTRAVENOUS | Status: DC | PRN
  Administered 2019-12-09: 150 ug/kg/min via INTRAVENOUS

## 2019-12-09 MED ORDER — TRAMADOL HCL 50 MG PO TABS: 50.0000 mg | ORAL_TABLET | Freq: Four times a day (QID) | ORAL | Status: DC | PRN

## 2019-12-09 MED ORDER — POLYETHYLENE GLYCOL 3350 17 G PO PACK
17.0000 g | PACK | Freq: Two times a day (BID) | ORAL | Status: DC
  Administered 2019-12-10: 17 g via ORAL
  Filled 2019-12-09 (×3): qty 1

## 2019-12-09 MED ORDER — METOCLOPRAMIDE HCL 5 MG/ML IJ SOLN
10.0000 mg | Freq: Three times a day (TID) | INTRAMUSCULAR | Status: DC
  Administered 2019-12-09 – 2019-12-10 (×5): 10 mg via INTRAVENOUS
  Filled 2019-12-09 (×5): qty 2

## 2019-12-09 NOTE — Transfer of Care (Signed)
Immediate Anesthesia Transfer of Care Note  Patient: [name]  Procedure(s) Performed: TRANSESOPHAGEAL ECHOCARDIOGRAM (TEE) (N/A ) CARDIOVERSION (N/A )  Patient Location: Endoscopy Unit  Anesthesia Type:MAC  Level of Consciousness: awake, alert , oriented and drowsy  Airway & Oxygen Therapy: Patient Spontanous Breathing and Patient connected to nasal cannula oxygen  Post-op Assessment: Report given to RN, Post -op Vital signs reviewed and stable and Patient moving all extremities X 4  Post vital signs: Reviewed and stable  Last Vitals:  Vitals Value Taken Time  BP    Temp    Pulse 58 12/09/19 1306  Resp 22 12/09/19 1306  SpO2 94 % 12/09/19 1306  Vitals shown include unvalidated device data.  Last Pain:  Vitals:   12/09/19 1155  TempSrc:   PainSc: 4       Patients Stated Pain Goal: 0 (25/63/89 3734)  Complications: No apparent anesthesia complications

## 2019-12-09 NOTE — CV Procedure (Signed)
   TRANSESOPHAGEAL ECHOCARDIOGRAM GUIDED DIRECT CURRENT CARDIOVERSION  NAME:  [name]   MRN: [id number] DOB:  1945-12-06   ADMIT DATE: 12/04/2019  INDICATIONS:  Atrial fib  PROCEDURE:   Informed consent was obtained prior to the procedure. The risks, benefits and alternatives for the procedure were discussed and the patient comprehended these risks.  Risks include, but are not limited to, cough, sore throat, vomiting, nausea, somnolence, esophageal and stomach trauma or perforation, bleeding, low blood pressure, aspiration, pneumonia, infection, trauma to the teeth and death.    After a procedural time-out, the oropharynx was anesthetized and the patient was sedated by the anesthesia service. The transesophageal probe was inserted in the esophagus and stomach without difficulty and multiple views were obtained.   FINDINGS:  LEFT VENTRICLE: EF = 55% Severe hypertrophy of anterospetal wall  RIGHT VENTRICLE: Normal Size and function  LEFT ATRIUM: Severely dialted  LEFT ATRIAL APPENDAGE: s/p LAA occluder. No residual appendage  RIGHT ATRIUM: Severely dilated.  Small mobile vegetation on RA lead  AORTIC VALVE:  Trileaflet. Mildly thickened. No AI/AS  MITRAL VALVE:    Normal moderate MR  TRICUSPID VALVE: Normal severe TR  PULMONIC VALVE: Normal mild PI  INTERATRIAL SEPTUM: No obvious PFO/ASD  PERICARDIUM: No effusion  DESCENDING AORTA: Severe plaque   CARDIOVERSION:     Indications:  Atrial Fibrillation  Procedure Details:  Once the TEE was complete, the patient had the defibrillator pads placed in the anterior and posterior position. Once an appropriate level of sedation was achieved, the patient received a single biphasic, synchronized 200J shock with prompt conversion to sinus rhythm with AV pacing at 60. No apparent complications.   Glori Bickers, MD  1:00 PM

## 2019-12-09 NOTE — Progress Notes (Signed)
Patient still experiencing some nausea even though zofran has been given several times.  Per patient, stated that she had nausea all day yesterday.

## 2019-12-09 NOTE — Progress Notes (Signed)
PROGRESS NOTE    [name]  [id number] DOB: 1946-02-27 DOA: 12/04/2019 PCP: Maurice Small, MD   Brief Narrative:  74 year old with history of paroxysmal A. fib on Eliquis, systolic CHF status post CABG, ventricular tachycardia status post ICD placement 1113, HTN, hypertrophic cardiomyopathy, HLD who presented with chest pain and back pain ongoing for 1 month.  In ED noted to be in atrial fibrillation with RVR, CHF exacerbation.  Cardiology and hospitalist team consulted for admission and further care.  Diagnosed with left breast mass with concerns for possible metastases.  Difficulty getting patient formal diagnosis.  Oncology working to see if something can be done inpatient.  Bone scan completed 12/06/2019 and biopsy performed.   Assessment & Plan:   Principal Problem:   Chest pain Active Problems:   Diabetes mellitus type 2, noninsulin dependent ([name])   Atrial fibrillation with RVR ([name])   Systolic CHF ([name])   CAD (coronary artery disease)   HLD (hyperlipidemia)   Acute on chronic congestive heart failure ([name])   Paroxysmal atrial fibrillation ([name])   CKD (chronic kidney disease) stage 3, GFR 30-59 ml/min   S/P CABG x 5   HTN (hypertension)   Breast cancer metastasized to bone ([name])   Goals of care, counseling/discussion   Acute hypoxic respiratory failure in the setting of acutely decompensated congestive heart failure with reduced ejection fraction, 45%, class III, resolved Hypertrophic cardiomyopathy CAD status post CABG - Diuretics held due to acute kidney injury as below; cardiology following - Strict input and output, daily weight - minimal weight gain as per recordings: - Ambulatory oxygen screening daily -at rest on room air  Intake/Output Summary (Last 24 hours) at 12/09/2019 0721 Last data filed at 12/09/2019 0156 Gross per 24 hour  Intake 720 ml  Output 600 ml  Net 120 ml   Compression fracture of thoracic vertebrae and acute C7 transverse process - IR  consulted, bone scan has been completed - Fractures appear to be chronic in nature, not candidate for vertebral augmentation per IR - Zometa ordered by Onc - For C7 transverse spine fracture patient can use soft collar 4-6 weeks and pain control-Per previous curbside neurosurgery.  Left-sided breast mass, upper quadrant - Concerning for breast cancer with metastatic disease given family history/imaging.   -Biopsy taken, results pending  Acute kidney injury on CKD stage II, worsening in the setting of above - Baseline creatinine 0.9 - likely secondary to diuresis and contrast - Continue to follow creatinine, if continues to worsen will attempt fluid trial Lab Results  Component Value Date   CREATININE 2.08 (H) 12/09/2019   CREATININE 1.82 (H) 12/08/2019   CREATININE 1.95 (H) 12/07/2019   Atrial fibrillation with RVR, rate poorly controlled - Transition to Eliquis per cardiology - Successfully cardioverted 12/09/2019 - Defer to cardiology whether patient needs to be resumed on p.o. rate control medications; somewhat difficult to control given patient's beta-blocker intolerance and inability to tolerate other medications due to heart failure history as above   Atypical chest pain and upper back pain in the setting of acute C7 fracture and metastatic breast cancer - This is likely referred pain from her thoracic spine fracture, not cardiogenic in nature(see below) - Concurrent left breast mass, cardiomegaly with pleural effusion/interstitial edema  Vitamin D deficiency -Vitamin D supplements 50,000 units every 7 days.  History of ventricular tachycardia status post ICD placement -Continues on telemetry, monitor closely  Diabetes mellitus type 2 -Sliding scale and Accu-Chek, hemoglobin A1c 7.7  Hyperlipidemia - Continue  statin, LDL at goal-61  History of anxiety/depression - Continue home meds  Unintentional weight loss -TSH- Neg -HIV- pending  DVT prophylaxis: Heparin  drip Code Status: Full code Family Communication: None  Status is: Inpatient  Remains inpatient appropriate because:Ongoing diagnostic testing needed not appropriate for outpatient work up   Dispo: The patient is from: Home              Anticipated d/c is to: Home              Anticipated d/c date is: 2 days              Patient currently undergoing work-up for breast mass and multiple compression fracture complicated by uncontrolled A. fib with RVR requiring cardioversion and CHF exacerbation.   Subjective: No acute issues or events overnight, patient reports ongoing nausea but no vomiting.  Currently n.p.o. for possible cardioversion later this morning, she also complains of ongoing back pain currently moderately controlled but otherwise somewhat anxious about her cardioversion and ultimate disposition given new diagnosis of widely metastatic breast cancer.  Examination: Constitutional: Not in acute distress, on room air Respiratory: Bibasilar crackles Cardiovascular: Irregularly irregular Abdomen: Nontender nondistended good bowel sounds Musculoskeletal: No edema noted Skin: No rashes seen Neurologic: CN 2-12 grossly intact.  And nonfocal Psychiatric: Overall anxious otherwise alert awake oriented.  Objective: Vitals:   12/08/19 0500 12/08/19 1429 12/08/19 2132 12/09/19 0455  BP: (!) 138/92 111/64 134/86 140/80  Pulse: (!) 54 85 74 84  Resp:  17    Temp: 98.1 F (36.7 C)  (!) 97.4 F (36.3 C) (!) 97.4 F (36.3 C)  TempSrc: Oral Oral Oral Oral  SpO2: 92% 94% 96% 95%  Weight: 82.1 kg   83.5 kg  Height:        Intake/Output Summary (Last 24 hours) at 12/09/2019 0721 Last data filed at 12/09/2019 0156 Gross per 24 hour  Intake 720 ml  Output 600 ml  Net 120 ml   Filed Weights   12/07/19 0430 12/08/19 0500 12/09/19 0455  Weight: 81.9 kg 82.1 kg 83.5 kg     Data Reviewed:   CBC: Recent Labs  Lab 12/05/19 0442 12/06/19 0402 12/07/19 0512 12/08/19 0307  12/09/19 0345  WBC 8.3 7.8 9.5 9.3 12.2*  HGB 11.1* 11.0* 11.1* 10.7* 10.9*  HCT 37.1 36.8 36.6 35.3* 36.1  MCV 88.5 88.7 89.3 89.1 89.1  PLT 328 340 337 267 [id number]   Basic Metabolic Panel: Recent Labs  Lab 12/04/19 1324 12/04/19 1637 12/05/19 0442 12/06/19 0402 12/07/19 0512 12/08/19 0307 12/09/19 0345  NA   < >  --  142 138 135 134* 133*  K   < >  --  3.8 3.6 4.5 4.6 4.2  CL   < >  --  108 103 101 104 101  CO2   < >  --  20* 23 22 19* 21*  GLUCOSE   < >  --  134* 141* 202* 130* 109*  BUN   < >  --  18 23 33* 36* 43*  CREATININE   < >  --  0.98 1.66* 1.95* 1.82* 2.08*  CALCIUM   < >  --  9.1 9.0 9.0 7.9* 7.7*  MG  --  1.7  --  1.7 2.0 1.9 2.1  PHOS  --  4.4  --   --   --   --   --    < > = values in this interval not displayed.  GFR: Estimated Creatinine Clearance: 25.9 mL/min (A) (by C-G formula based on SCr of 2.08 mg/dL (H)). Liver Function Tests: Recent Labs  Lab 12/05/19 0442 12/06/19 0402 12/07/19 0512 12/08/19 0307  AST 33 28 28 27   ALT 28 25 25 21   ALKPHOS 103 101 115 98  BILITOT 1.2 1.1 0.9 0.7  PROT 6.7 6.5 6.7 6.1*  ALBUMIN 3.3* 3.3* 3.6 3.2*   No results for input(s): LIPASE, AMYLASE in the last 168 hours. No results for input(s): AMMONIA in the last 168 hours. Coagulation Profile: No results for input(s): INR, PROTIME in the last 168 hours. Cardiac Enzymes: No results for input(s): CKTOTAL, CKMB, CKMBINDEX, TROPONINI in the last 168 hours. BNP (last 3 results) No results for input(s): PROBNP in the last 8760 hours. HbA1C: No results for input(s): HGBA1C in the last 72 hours. CBG: Recent Labs  Lab 12/08/19 0646 12/08/19 1205 12/08/19 1625 12/08/19 2133 12/09/19 0153  GLUCAP 144* 215* 318* 94 99   Lipid Profile: No results for input(s): CHOL, HDL, LDLCALC, TRIG, CHOLHDL, LDLDIRECT in the last 72 hours. Thyroid Function Tests: No results for input(s): TSH, T4TOTAL, FREET4, T3FREE, THYROIDAB in the last 72 hours. Anemia Panel: No results  for input(s): VITAMINB12, FOLATE, FERRITIN, TIBC, IRON, RETICCTPCT in the last 72 hours. Sepsis Labs: No results for input(s): PROCALCITON, LATICACIDVEN in the last 168 hours.  Recent Results (from the past 240 hour(s))  Respiratory Panel by RT PCR (Flu A&B, Covid) - Nasopharyngeal Swab     Status: None   Collection Time: 12/04/19  2:06 PM   Specimen: Nasopharyngeal Swab  Result Value Ref Range Status   SARS Coronavirus 2 by RT PCR NEGATIVE NEGATIVE Final    Comment: (NOTE) SARS-CoV-2 target nucleic acids are NOT DETECTED. The SARS-CoV-2 RNA is generally detectable in upper respiratoy specimens during the acute phase of infection. The lowest concentration of SARS-CoV-2 viral copies this assay can detect is 131 copies/mL. A negative result does not preclude SARS-Cov-2 infection and should not be used as the sole basis for treatment or other patient management decisions. A negative result may occur with  improper specimen collection/handling, submission of specimen other than nasopharyngeal swab, presence of viral mutation(s) within the areas targeted by this assay, and inadequate number of viral copies (<131 copies/mL). A negative result must be combined with clinical observations, patient history, and epidemiological information. The expected result is Negative. Fact Sheet for Patients:  PinkCheek.be Fact Sheet for Healthcare Providers:  GravelBags.it This test is not yet ap proved or cleared by the [place] FDA and  has been authorized for detection and/or diagnosis of SARS-CoV-2 by FDA under an Emergency Use Authorization (EUA). This EUA will remain  in effect (meaning this test can be used) for the duration of the COVID-19 declaration under Section 564(b)(1) of the Act, 21 U.S.C. section 360bbb-3(b)(1), unless the authorization is terminated or revoked sooner.    Influenza A by PCR NEGATIVE NEGATIVE Final    Influenza B by PCR NEGATIVE NEGATIVE Final    Comment: (NOTE) The Xpert Xpress SARS-CoV-2/FLU/RSV assay is intended as an aid in  the diagnosis of influenza from Nasopharyngeal swab specimens and  should not be used as a sole basis for treatment. Nasal washings and  aspirates are unacceptable for Xpert Xpress SARS-CoV-2/FLU/RSV  testing. Fact Sheet for Patients: PinkCheek.be Fact Sheet for Healthcare Providers: GravelBags.it This test is not yet approved or cleared by the [place] FDA and  has been authorized for detection and/or diagnosis of SARS-CoV-2 by  FDA under an Emergency Use Authorization (EUA). This EUA will remain  in effect (meaning this test can be used) for the duration of the  Covid-19 declaration under Section 564(b)(1) of the Act, 21  U.S.C. section 360bbb-3(b)(1), unless the authorization is  terminated or revoked. Performed at [facility], [address], [place], [place] [address]          Radiology Studies: IR US Guide Bx Asp/Drain  Result Date: 12/08/2019 CLINICAL DATA:  Left breast mass.  Osseous metastatic disease. EXAM: ULTRASOUND GUIDED CORE BIOPSY OF LEFT BREAST MASS MEDICATIONS: Lidocaine 1% subcutaneous PROCEDURE: The procedure, risks, benefits, and alternatives were explained to the patient. Questions regarding the procedure were encouraged and answered. The patient understands and consents to the procedure. Hypoechoic upper left breast mass was localized corresponding to CT findings. Appropriate skin entry site was determined and marked. The operative field was prepped with Betadine in a sterile fashion, and a sterile drape was applied covering the operative field. A sterile gown and sterile gloves were used for the procedure. Local anesthesia was provided with 1% Lidocaine. Under real-time ultrasound guidance, a 17 gauge trocar needle was advanced to the margin of the lesion. Once needle  tip position was confirmed, coaxial 18-gauge core biopsy samples were obtained, submitted in formalin to surgical pathology. The guide needle was removed. Postprocedure scan shows no hemorrhage or other apparent complication. The patient tolerated the procedure well. COMPLICATIONS: None. FINDINGS: Hypoechoic upper left breast mass localized. Core biopsy samples obtained as above. IMPRESSION: 1. Technically successful core biopsy, left breast mass. Electronically Signed   By: Lucrezia Europe M.D.   On: 12/08/2019 09:34   Scheduled Meds: . apixaban  5 mg Oral BID  . feeding supplement (ENSURE ENLIVE)  237 mL Oral BID BM  . insulin aspart  0-15 Units Subcutaneous TID WC  . insulin aspart  0-5 Units Subcutaneous QHS  . metoCLOPramide (REGLAN) injection  10 mg Intravenous Q8H  . multivitamin with minerals  1 tablet Oral Daily  . polyethylene glycol  17 g Oral BID  . Vitamin D (Ergocalciferol)  50,000 Units Oral Q7 days   Continuous Infusions: . amiodarone 30 mg/hr (12/08/19 1918)     LOS: 5 days   Time spent> 35 mins  Little Ishikawa, DO [facility]  If 7PM-7AM, please contact night-coverage  12/09/2019, 7:21 AM

## 2019-12-09 NOTE — Anesthesia Preprocedure Evaluation (Addendum)
Anesthesia Evaluation  Patient identified by MRN, date of birth, ID band Patient awake    Reviewed: Allergy & Precautions, NPO status , Patient's Chart, lab work & pertinent test results  Airway Mallampati: II  TM Distance: >3 FB Neck ROM: Full    Dental  (+) Dental Advisory Given, Missing,    Pulmonary former smoker,    breath sounds clear to auscultation       Cardiovascular hypertension, + Past MI, + CABG and +CHF  + dysrhythmias Atrial Fibrillation  Rhythm:Regular Rate:Normal     Neuro/Psych negative neurological ROS  negative psych ROS   GI/Hepatic negative GI ROS, Neg liver ROS,   Endo/Other  diabetes, Type 2, Oral Hypoglycemic AgentsHypothyroidism   Renal/GU Renal disease     Musculoskeletal negative musculoskeletal ROS (+)   Abdominal Normal abdominal exam  (+)   Peds  Hematology   Anesthesia Other Findings   Reproductive/Obstetrics                            Anesthesia Physical Anesthesia Plan  ASA: III  Anesthesia Plan: General   Post-op Pain Management:    Induction: Intravenous  PONV Risk Score and Plan: 0 and Propofol infusion  Airway Management Planned: Natural Airway and Nasal Cannula  Additional Equipment: None  Intra-op Plan:   Post-operative Plan:   Informed Consent: I have reviewed the patients History and Physical, chart, labs and discussed the procedure including the risks, benefits and alternatives for the proposed anesthesia with the patient or authorized representative who has indicated his/her understanding and acceptance.     Dental advisory given  Plan Discussed with: CRNA  Anesthesia Plan Comments: (Echo:  1. Left ventricular ejection fraction, by estimation, is 35 to 40%. The  left ventricle has moderately decreased function. The left ventricle  demonstrates regional wall motion abnormalities (see scoring  diagram/findings for  description). There is severe  asymmetric left ventricular hypertrophy. Left ventricular diastolic  function could not be evaluated.  2. Right ventricular systolic function is low normal. The right  ventricular size is normal. There is severely elevated pulmonary artery  systolic pressure.  3. Left atrial size was severely dilated.  4. Right atrial size was mildly dilated.  5. The mitral valve is degenerative. Mild mitral valve regurgitation. No  evidence of mitral stenosis.  6. Tricuspid valve regurgitation is moderate.  7. The aortic valve is bicuspid. Aortic valve regurgitation is not  visualized. Mild aortic valve sclerosis is present, with no evidence of  aortic valve stenosis.  8. The inferior vena cava is dilated in size with <50% respiratory  variability, suggesting right atrial pressure of 15 mmHg. )       Anesthesia Quick Evaluation

## 2019-12-09 NOTE — Progress Notes (Signed)
Request to IR for review for possible KP of multiple compression fractures placed today by Dr. Marin Olp.  Unfortunately this patient was already deemed to not be a candidate for vertebral augmentation - please see IR PA note dated 12/07/19.   Order will be cancelled - IR remains available if needed.  Candiss Norse, PA-C Pager# [phone]

## 2019-12-09 NOTE — Progress Notes (Signed)
  Echocardiogram Echocardiogram Transesophageal has been performed.  [name] 12/09/2019, 1:07 PM

## 2019-12-09 NOTE — Progress Notes (Signed)
Advanced Heart Failure Rounding Note  PCP-Cardiologist: No primary care provider on file.   Subjective:    Oncology following for new diagnosis of metastatic breast cancer. CA 27.29 is over 700. Vertebral fractures likely pathologic.   12/07/19 S/P Left Breast biopsy   Echo repeated on 12/06/19 . EF is lower compared to prior exam. Now 35-40% (previously 45-50%). RV normal. RA pressure 15. RVSP severely elevated at 63mmHg.   Remains on amio drip 30 mg/hour + heparin drip.   Has been on IV lasix. I/Os seem incomplete. Weight up.       Objective:   Weight Range: 83.5 kg Body mass index is 31.6 kg/m.   Vital Signs:   Temp:  [97.4 F (36.3 C)] 97.4 F (36.3 C) (05/07 0455) Pulse Rate:  [74-93] 93 (05/07 1155) Resp:  [17-21] 21 (05/07 1155) BP: (111-145)/(64-86) 145/80 (05/07 1155) SpO2:  [94 %-96 %] 94 % (05/07 1155) Weight:  [83.5 kg] 83.5 kg (05/07 0455) Last BM Date: 12/04/19  Weight change: Filed Weights   12/07/19 0430 12/08/19 0500 12/09/19 0455  Weight: 81.9 kg 82.1 kg 83.5 kg    Intake/Output:   Intake/Output Summary (Last 24 hours) at 12/09/2019 1218 Last data filed at 12/09/2019 0156 Gross per 24 hour  Intake 240 ml  Output 600 ml  Net -360 ml      Physical Exam    General:  Weak appearing. Nauseated.  HEENT: normal Neck: supple. + JVD. Carotids 2+ bilat; no bruits. No lymphadenopathy or thryomegaly appreciated. Cor: PMI nondisplaced. Irrgular rate & rhythm. No rubs, gallops or murmurs. Lungs: clear Abdomen: soft, nontender, nondistended. No hepatosplenomegaly. No bruits or masses. Good bowel sounds. Extremities: no cyanosis, clubbing, rash, edema Neuro: alert & orientedx3, cranial nerves grossly intact. moves all 4 extremities w/o difficulty. Affect pleasant   Telemetry   A fib 90s Personally reviewed  Labs    CBC Recent Labs    12/08/19 0307 12/09/19 0345  WBC 9.3 12.2*  HGB 10.7* 10.9*  HCT 35.3* 36.1  MCV 89.1 89.1  PLT 267 294     Basic Metabolic Panel Recent Labs    12/08/19 0307 12/09/19 0345  NA 134* 133*  K 4.6 4.2  CL 104 101  CO2 19* 21*  GLUCOSE 130* 109*  BUN 36* 43*  CREATININE 1.82* 2.08*  CALCIUM 7.9* 7.7*  MG 1.9 2.1   Liver Function Tests Recent Labs    12/07/19 0512 12/08/19 0307  AST 28 27  ALT 25 21  ALKPHOS 115 98  BILITOT 0.9 0.7  PROT 6.7 6.1*  ALBUMIN 3.6 3.2*   No results for input(s): LIPASE, AMYLASE in the last 72 hours. Cardiac Enzymes No results for input(s): CKTOTAL, CKMB, CKMBINDEX, TROPONINI in the last 72 hours.  BNP: BNP (last 3 results) Recent Labs    12/04/19 1324  BNP 581.1*    ProBNP (last 3 results) No results for input(s): PROBNP in the last 8760 hours.   D-Dimer No results for input(s): DDIMER in the last 72 hours. Hemoglobin A1C No results for input(s): HGBA1C in the last 72 hours. Fasting Lipid Panel No results for input(s): CHOL, HDL, LDLCALC, TRIG, CHOLHDL, LDLDIRECT in the last 72 hours. Thyroid Function Tests No results for input(s): TSH, T4TOTAL, T3FREE, THYROIDAB in the last 72 hours.  Invalid input(s): FREET3  Other results:   Imaging    No results found.   Medications:     Scheduled Medications: . [MAR Hold] apixaban  5 mg Oral BID  . [  MAR Hold] feeding supplement (ENSURE ENLIVE)  237 mL Oral BID BM  . [MAR Hold] insulin aspart  0-15 Units Subcutaneous TID WC  . [MAR Hold] insulin aspart  0-5 Units Subcutaneous QHS  . [MAR Hold] metoCLOPramide (REGLAN) injection  10 mg Intravenous Q8H  . [MAR Hold] multivitamin with minerals  1 tablet Oral Daily  . [MAR Hold] polyethylene glycol  17 g Oral BID  . [MAR Hold] Vitamin D (Ergocalciferol)  50,000 Units Oral Q7 days    Infusions: . sodium chloride 20 mL/hr at 12/09/19 1213  . amiodarone 30 mg/hr (12/09/19 1041)    PRN Medications: [MAR Hold] acetaminophen **OR** [MAR Hold] acetaminophen, lidocaine (PF), [MAR Hold] magnesium hydroxide, [MAR Hold]  morphine injection,  [MAR Hold] ondansetron (ZOFRAN) IV, [MAR Hold] oxyCODONE, [MAR Hold] senna-docusate, [MAR Hold] traMADol    Assessment/Plan   1. 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 Prolonged post-hospital course with low output HF despite relatively preserved EF. RHC suggestive of primarily RV failure. -EF 45-50% post CABG 5/18 - PYP  scan negative for amyloid. - echo 06/2019 showed mildly reduced LVEF 45-50% w/ Severe asymetric septal hypertrophy no SAM or LVOT gradient. RV ok  - Admitted w/ NYHA III symptoms + mild volume overload. CXR w/ interstitial edema + right sided pleural effusion, BNP 581.Device interrogation 5/2 showed CorVue c/w mild volume overload, in the setting of persistent Afib (since 10/07/19). -  - Echo 12/06/19 EF 35-40% - Hold off on  ARB/ARNi retrial + spiro given AKI. Refuses b-blocker - Appears volume overloaded but not responding well to IV lasix. May need RHC  2. Atrial Fibrillation  - prior h/o Afib/ Flutter w/ previous cardioversions + MAZE and LAA clip in 2018 at time of CABG. On chronic a/c w/ Eliquis PTA (no missed doses in the last 30 days) - now back in AFib. Device interrogation shows that she has been in persistent afib since 10/07/19 - She refuses  blocker   Avoid use of Cardizem given LV dysfunction.  - Continue amio 30 mg per hour for rate control  - For TEE/DC-CV today. Continue Eliquis  3. Apical hypertrophic cardiomyopathy  - Echo 06/2019 showed mildly reduced LVEF 45-50% w/ Severe asymetric septal hypertrophy no SAM or LVOT gradient - Echo repeated 5/4 showed severe asymmetric left ventricular hypertrophy. LVOT diameter 1.9 cm  - denies syncope - has ICD. No VT detection on device interrogation   4. CAD - s/p CABG x 4 in 2018 - no s/s angina - hs troponin normal x 2, 15>>16 - refuses ? blocker - resume home rosuvastatin   5. Metastatic Breast Cancer  - incidental finding noted on Chest CT 5/2,  measuring 4.1 x 2.4 cm  - CA 29.27 over 700. Oncology following  - S/P L Breast Biopsty 12/07/19   -  suspected to have bone mets   6. Multilevel Vertebral Compression Fractures, Likely Cancer Metastases   -  found to have acute appearing, multilevel vertebral  compression fractures (C7 vertebral body/ transfer process, T4 and T5 vertebral bodies, ? T3 vertebral body). No recent falls/trauma  - suspect pathologic bone fractures/ bone mets from breast CA - Oncology added Zometa  - Oncology and IR to decide on potential vertebroplasty -> will not be able to stop Eliquis for at least 3 weeks after DC-CV   7. AKI - Bump in SCr from 0.98>>1.66>>1.95>>1.8 -> 2.0, suspect contrast nephropathy from chest CT w/ contrast  5/2 may be cardiorenal - Consider RHC   Length of Stay: 5  Glori Bickers, MD  12/09/2019, 12:18 PM  Advanced Heart Failure Team Pager [phone] (M-F; [place])  Please contact [facility] for night-coverage after hours (4p -7a ) and weekends on [email or web address]

## 2019-12-09 NOTE — Progress Notes (Signed)
Ms. Bua is having a lot of nausea.  I will know if this is from her pain medications.  We started her on a fentanyl patch Wednesday.  She started having some nausea yesterday.  We will stop the fentanyl patch.  She is also on hydrocodone.  I will try her on some tramadol.  She does have metastatic breast cancer.  The biopsy came back positive for the left breast mass.  We are awaiting the prognostic markers.  Once I know that the tumor is estrogen positive, we will give her antiestrogen therapy.  I think the best option for her would be Faslodex along with ribociclib.  She is supposed to be cardioverted today from what she tells me.  Hopefully this will happen.  She is off the heparin infusion and now on Eliquis.  She is having pain in the back.  This is from the compression fractures.  We will see if interventional radiology might be able to consider vertebroplasty for this.  If not, then we might think about radiation therapy.  She has already had a dose of Zometa.  She is constipated.  This might contribute to the nausea.  I will give her the MiraLAX twice a day.  She has had no bleeding.  She has had no fever.  She has had no vomiting.  Hopefully, cardiology will be successful at the cardioversion.  She is on amiodarone drip.  I will know if this might be causing nausea.  I told her that I really thought that her breast cancer was not going to be the primary determinant of her prognosis.  I thought that her cardiac status would be what would determine her prognosis.  Hopefully, if her tumor is estrogen positive, her prognosis could be several years.  I very much appreciate the outstanding care that she is getting from the staff upon 6 E.  Lattie Haw, MD  Proverbs 18:10

## 2019-12-09 NOTE — Anesthesia Postprocedure Evaluation (Signed)
Anesthesia Post Note  Patient: [name]  Procedure(s) Performed: TRANSESOPHAGEAL ECHOCARDIOGRAM (TEE) (N/A ) CARDIOVERSION (N/A )     Patient location during evaluation: PACU Anesthesia Type: General Level of consciousness: awake and alert Pain management: pain level controlled Vital Signs Assessment: post-procedure vital signs reviewed and stable Respiratory status: spontaneous breathing, nonlabored ventilation, respiratory function stable and patient connected to nasal cannula oxygen Cardiovascular status: blood pressure returned to baseline and stable Postop Assessment: no apparent nausea or vomiting Anesthetic complications: no    Last Vitals:  Vitals:   12/09/19 1316 12/09/19 1325  BP: 122/67 133/84  Pulse: 70 74  Resp: (!) 23 (!) 21  Temp:    SpO2: 90% 92%    Last Pain:  Vitals:   12/09/19 1325  TempSrc:   PainSc: 0-No pain                 Effie Berkshire

## 2019-12-09 NOTE — Interval H&P Note (Signed)
History and Physical Interval Note:  12/09/2019 12:13 PM  [name]  has presented today for surgery, with the diagnosis of atrial fibrillation.  The various methods of treatment have been discussed with the patient and family. After consideration of risks, benefits and other options for treatment, the patient has consented to  Procedure(s): TRANSESOPHAGEAL ECHOCARDIOGRAM (TEE) (N/A) CARDIOVERSION (N/A) as a surgical intervention.  The patient's history has been reviewed, patient examined, no change in status, stable for surgery.  I have reviewed the patient's chart and labs.  Questions were answered to the patient's satisfaction.     [name]

## 2019-12-09 NOTE — Progress Notes (Signed)
OT Cancellation Note  Patient Details Name: [name] MRN: [id number] DOB: [birth year]   Cancelled Treatment:    Reason Eval/Treat Not Completed: Patient at procedure or test/ unavailable(TEE)  [place] 12/09/2019, 12:15 PM   Jesse Sans OTR/L Acute Rehabilitation Services Pager: [phone] Office: [phone]

## 2019-12-10 DIAGNOSIS — E785 Hyperlipidemia, unspecified: Secondary | ICD-10-CM

## 2019-12-10 DIAGNOSIS — Z951 Presence of aortocoronary bypass graft: Secondary | ICD-10-CM

## 2019-12-10 DIAGNOSIS — Z7189 Other specified counseling: Secondary | ICD-10-CM

## 2019-12-10 DIAGNOSIS — R079 Chest pain, unspecified: Secondary | ICD-10-CM

## 2019-12-10 DIAGNOSIS — I5022 Chronic systolic (congestive) heart failure: Secondary | ICD-10-CM

## 2019-12-10 DIAGNOSIS — M8448XG Pathological fracture, other site, subsequent encounter for fracture with delayed healing: Secondary | ICD-10-CM

## 2019-12-10 DIAGNOSIS — N63 Unspecified lump in unspecified breast: Secondary | ICD-10-CM

## 2019-12-10 DIAGNOSIS — N1831 Chronic kidney disease, stage 3a: Secondary | ICD-10-CM

## 2019-12-10 LAB — CBC
HCT: 36.6 % (ref 36.0–46.0)
Hemoglobin: 10.9 g/dL — ABNORMAL LOW (ref 12.0–15.0)
MCH: 26.3 pg (ref 26.0–34.0)
MCHC: 29.8 g/dL — ABNORMAL LOW (ref 30.0–36.0)
MCV: 88.2 fL (ref 80.0–100.0)
Platelets: 322 10*3/uL (ref 150–400)
RBC: 4.15 MIL/uL (ref 3.87–5.11)
RDW: 17.2 % — ABNORMAL HIGH (ref 11.5–15.5)
WBC: 10.6 10*3/uL — ABNORMAL HIGH (ref 4.0–10.5)
nRBC: 0 % (ref 0.0–0.2)

## 2019-12-10 LAB — GLUCOSE, CAPILLARY
Glucose-Capillary: 182 mg/dL — ABNORMAL HIGH (ref 70–99)
Glucose-Capillary: 137 mg/dL — ABNORMAL HIGH (ref 70–99)
Glucose-Capillary: 156 mg/dL — ABNORMAL HIGH (ref 70–99)
Glucose-Capillary: 147 mg/dL — ABNORMAL HIGH (ref 70–99)

## 2019-12-10 LAB — BASIC METABOLIC PANEL
Anion gap: 14 (ref 5–15)
BUN: 52 mg/dL — ABNORMAL HIGH (ref 8–23)
CO2: 20 mmol/L — ABNORMAL LOW (ref 22–32)
Calcium: 7.6 mg/dL — ABNORMAL LOW (ref 8.9–10.3)
Chloride: 98 mmol/L (ref 98–111)
Creatinine, Ser: 2.19 mg/dL — ABNORMAL HIGH (ref 0.44–1.00)
GFR calc Af Amer: 25 mL/min — ABNORMAL LOW (ref >60)
GFR calc non Af Amer: 22 mL/min — ABNORMAL LOW (ref >60)
Glucose, Bld: 176 mg/dL — ABNORMAL HIGH (ref 70–99)
Potassium: 4.3 mmol/L (ref 3.5–5.1)
Sodium: 132 mmol/L — ABNORMAL LOW (ref 135–145)

## 2019-12-10 LAB — MAGNESIUM: Magnesium: 2.3 mg/dL (ref 1.7–2.4)

## 2019-12-10 MED ORDER — AMIODARONE HCL 200 MG PO TABS
200.0000 mg | ORAL_TABLET | Freq: Two times a day (BID) | ORAL | Status: DC
  Administered 2019-12-10 (×2): 200 mg via ORAL
  Filled 2019-12-10 (×2): qty 1

## 2019-12-10 MED ORDER — ONDANSETRON 4 MG PO TBDP
4.0000 mg | ORAL_TABLET | Freq: Three times a day (TID) | ORAL | 0 refills | Status: DC | PRN
Start: 2019-12-10 — End: 2019-12-16

## 2019-12-10 MED ORDER — POLYETHYLENE GLYCOL 3350 17 G PO PACK: 17.0000 g | PACK | Freq: Two times a day (BID) | ORAL | 0 refills | Status: DC

## 2019-12-10 MED ORDER — AMIODARONE HCL 200 MG PO TABS: 200.0000 mg | ORAL_TABLET | Freq: Two times a day (BID) | ORAL | Status: DC

## 2019-12-10 MED ORDER — OXYCODONE HCL 5 MG PO TABS: 5.0000 mg | ORAL_TABLET | ORAL | 0 refills | Status: DC | PRN

## 2019-12-10 MED ORDER — SENNOSIDES-DOCUSATE SODIUM 8.6-50 MG PO TABS: 2.0000 | ORAL_TABLET | Freq: Every evening | ORAL | 0 refills | Status: DC | PRN

## 2019-12-10 NOTE — TOC Transition Note (Addendum)
Transition of Care [facility]) - CM/SW Discharge Note   Patient Details  Name: [name] MRN: [id number] Date of Birth: 01-06-1946  [facility] of [facility]) CM/SW Contact:  Bartholomew Crews, RN Phone Number: [phone] 12/10/2019, 2:36 PM   Clinical Narrative:     Spoke with patient on her room phone. Discussed discharge home today. Patient now agreeable to [facility] RN, but still declines PT. Referral accepted by [facility] with anticipated start of care for the middle of next week. Patient unsure if she has transportation home today. Advised to let her nurse know if she needs assistance home.   Update: Spoke with patient at the bedside to discuss transition home today. Patient states that her grandson is working until 10pm, so it will be about 10:30 before he is able to pick her up. Patient declined a cab stating she does not feel comfortable with that option. Discussed [facility] to follow up with her for nursing on Wednesday next week. Patient also expressed interest in home care program through her insurance for patients who have been recently discharged from the hospital. [facility] notified for referral to home care division.    No further TOC needs identified.   Final next level of care: [place] Barriers to Discharge: No Barriers Identified   Patient Goals and CMS Choice Patient states their goals for this hospitalization and ongoing recovery are:: to return home CMS Medicare.gov Compare Post Acute Care list provided to:: Patient Choice offered to / list presented to : Patient  Discharge Placement                       Discharge Plan and Services In-house Referral: NA Discharge Planning Services: CM Consult Post Acute Care Choice: NA          DME Arranged: N/A DME Agency: NA       HH Arranged: RN [place] Agency: [facility] Date [place]: 12/10/19 Time HH Agency Contacted: [id number] Representative spoke with at [place]: [place] ([place]) Interventions     Readmission Risk Interventions Readmission Risk Prevention Plan 12/08/2019  Transportation Screening Complete  HRI or [place] Complete  Social Work Consult for [place] Planning/Counseling Complete  Palliative Care Screening Complete  Medication Review Press photographer) Complete  Some recent data might be hidden

## 2019-12-10 NOTE — Progress Notes (Signed)
Advanced Heart Failure Rounding Note  PCP-Cardiologist: No primary care provider on file.   Subjective:    Oncology following for new diagnosis of metastatic breast cancer. Vertebral fractures likely pathologic.   12/07/19 S/P Left Breast biopsy c/w ductal CA  Echo repeated on 12/06/19 . EF is lower compared to prior exam. Now 35-40% (previously 45-50%). RV normal. RA pressure 15. RVSP severely elevated at 71mmHg.    Underwent TEE/DC-CV on 5/7. Remains in NSR. TEE with EF 55%. Small vegetation on RA lead.   Feels a bit better than yesterday/ Still nauseated. C/o LE edema. No orthopnea or PND.     Objective:   Weight Range: 83.8 kg Body mass index is 31.72 kg/m.   Vital Signs:   Temp:  [97.3 F (36.3 C)-98.5 F (36.9 C)] 98.5 F (36.9 C) (05/08 0527) Pulse Rate:  [60-95] 94 (05/08 0527) Resp:  [18-23] 20 (05/08 0527) BP: (105-156)/(53-88) 156/88 (05/08 0527) SpO2:  [90 %-96 %] 96 % (05/08 0527) Weight:  [83.8 kg] 83.8 kg (05/08 0527) Last BM Date: 12/04/19  Weight change: Filed Weights   12/08/19 0500 12/09/19 0455 12/10/19 0527  Weight: 82.1 kg 83.5 kg 83.8 kg    Intake/Output:  No intake or output data in the 24 hours ending 12/10/19 0903    Physical Exam    General:  Lying in bed Weak appearing. No resp difficulty HEENT: normal Neck: supple. JVP 6-7 Carotids 2+ bilat; no bruits. No lymphadenopathy or thryomegaly appreciated. Cor: PMI nondisplaced. Regular rate & rhythm. 2/6 TR Lungs: clear Abdomen: soft, nontender, nondistended. No hepatosplenomegaly. No bruits or masses. Good bowel sounds. Extremities: no cyanosis, clubbing, rash, tr edema Neuro: alert & orientedx3, cranial nerves grossly intact. moves all 4 extremities w/o difficulty. Affect pleasant   Telemetry   Sinus 80-90s Personally reviewed  Labs    CBC Recent Labs    12/09/19 0345 12/10/19 0646  WBC 12.2* 10.6*  HGB 10.9* 10.9*  HCT 36.1 36.6  MCV 89.1 88.2  PLT 294 [id number]   Basic  Metabolic Panel Recent Labs    12/09/19 0345 12/10/19 0646  NA 133* 132*  K 4.2 4.3  CL 101 98  CO2 21* 20*  GLUCOSE 109* 176*  BUN 43* 52*  CREATININE 2.08* 2.19*  CALCIUM 7.7* 7.6*  MG 2.1 2.3   Liver Function Tests Recent Labs    12/08/19 0307  AST 27  ALT 21  ALKPHOS 98  BILITOT 0.7  PROT 6.1*  ALBUMIN 3.2*   No results for input(s): LIPASE, AMYLASE in the last 72 hours. Cardiac Enzymes No results for input(s): CKTOTAL, CKMB, CKMBINDEX, TROPONINI in the last 72 hours.  BNP: BNP (last 3 results) Recent Labs    12/04/19 1324  BNP 581.1*    ProBNP (last 3 results) No results for input(s): PROBNP in the last 8760 hours.   D-Dimer No results for input(s): DDIMER in the last 72 hours. Hemoglobin A1C No results for input(s): HGBA1C in the last 72 hours. Fasting Lipid Panel No results for input(s): CHOL, HDL, LDLCALC, TRIG, CHOLHDL, LDLDIRECT in the last 72 hours. Thyroid Function Tests No results for input(s): TSH, T4TOTAL, T3FREE, THYROIDAB in the last 72 hours.  Invalid input(s): FREET3  Other results:   Imaging    No results found.   Medications:     Scheduled Medications: . amiodarone  200 mg Oral BID  . apixaban  5 mg Oral BID  . feeding supplement (ENSURE ENLIVE)  237 mL Oral BID BM  .  insulin aspart  0-15 Units Subcutaneous TID WC  . insulin aspart  0-5 Units Subcutaneous QHS  . metoCLOPramide (REGLAN) injection  10 mg Intravenous Q8H  . multivitamin with minerals  1 tablet Oral Daily  . polyethylene glycol  17 g Oral BID  . Vitamin D (Ergocalciferol)  50,000 Units Oral Q7 days    Infusions:   PRN Medications: acetaminophen **OR** acetaminophen, lidocaine (PF), magnesium hydroxide, ondansetron (ZOFRAN) IV, oxyCODONE, senna-docusate, traMADol    Assessment/Plan   1. 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 Prolonged post-hospital course with low output HF despite relatively  preserved EF. RHC suggestive of primarily RV failure. -EF 45-50% post CABG 5/18 - PYP  scan negative for amyloid. - echo 06/2019 EF 45-50% w/ Severe asymetric septal hypertrophy no SAM or LVOT gradient. RV ok  - Admitted w/ NYHA III symptoms + mild volume overload. CXR w/ interstitial edema + right sided pleural effusion, BNP 581.Device interrogation 5/2 showed CorVue c/w mild volume overload, in the setting of persistent Afib (since 10/07/19). -  - Echo 12/06/19 EF 35-40% - TEE 12/09/19 EF 55% severe TR. Mod MR - Has not responded well to IV lasix. JVP likely elevated due to severe TR. Creatinine rising. Hold lasix for now. Respiratory status stable  2. Atrial Fibrillation  - prior h/o Afib/ Flutter w/ previous cardioversions + MAZE and LAA clip in 2018 at time of CABG. On chronic a/c w/ Eliquis PTA (no missed doses in the last 30 days) - now back in AFib. Device interrogation shows that she has been in persistent afib since 10/07/19 - s/p TEE/DC-CV on 5/7 Now in NSR - switch IV amio to po. Hopefully this will help with nausea - Continue uninterrupted Eliquis  3. Hypertrophic cardiomyopathy  - Echo 06/2019 showed mildly reduced LVEF 45-50% w/ Severe asymetric septal hypertrophy no SAM or LVOT gradient - Echo repeated 5/4 showed severe asymmetric left ventricular hypertrophy. LVOT diameter 1.9 cm  - denies syncope - has ICD. No VT detection on device interrogation   4. CAD - s/p CABG x 4 in 2018 - no s/s angina - hs troponin normal x 2, 15>>16 - refuses ? blocker - resume home rosuvastatin   5. Metastatic Breast Cancer  - incidental finding noted on Chest CT 5/2, measuring 4.1 x 2.4 cm  - CA 29.27 over 700. Oncology following  - S/P L Breast Biopsty 12/07/19  + ductal CA -  suspected to have bone mets   6. 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 T3 vertebral body). No recent falls/trauma  - suspect pathologic bone fractures/ bone mets from breast CA - Oncology added Zometa  - Oncology and IR to decide on potential vertebroplasty -> will not be able to stop Eliquis for at least 3 weeks after DC-CV   7. AKI - Bump in SCr from 0.98>>1.66>>1.95>>1.8 -> 2.0 -> 2.2, suspect contrast nephropathy from chest CT w/ contrast 5/2 may be cardiorenal - Hold diuretics for now. Avoid hypotension  8. Small vegetation on RA pacer lead - found incidentally on TEE for AF - asymptomatic. Will check Bcx   Length of Stay: [place], MD  12/10/2019, 9:03 AM  Advanced Heart Failure Team Pager [phone] (M-F; 7a - 4p)  Please contact [facility] for night-coverage  after hours (4p -7a ) and weekends on [email or web address]

## 2019-12-10 NOTE — Discharge Summary (Addendum)
Physician Discharge Summary  [name] [id number] DOB: Apr 23, 1946 DOA: 12/04/2019  PCP: Maurice Small, MD  Admit date: 12/04/2019 Discharge date: 12/10/2019  Admitted From: Home Disposition: Home  Recommendations for Outpatient Follow-up:  1. Follow up with PCP in 1-2 weeks 2. Please obtain BMP/CBC in one week 3. Please follow up on the following pending results:  Home Health: Yes Equipment/Devices: None  Discharge Condition: Guarded CODE STATUS: Full Diet recommendation: Low-salt, cardiac prudent diet  Brief/Interim Summary: 74 year old with history of paroxysmal A. fib on Eliquis, systolic CHF status post CABG, ventricular tachycardia status post ICD placement 1113, HTN, hypertrophic cardiomyopathy, HLD who presented with chest pain and back pain ongoing for 1 month.  In ED noted to be in atrial fibrillation with RVR, CHF exacerbation.  Cardiology and hospitalist team consulted for admission and further care.  Diagnosed with left breast mass with concerns for possible metastases.  Difficulty getting patient formal diagnosis.  Oncology working to see if something can be done inpatient.  Bone scan completed 12/06/2019 and biopsy performed.   Patient admitted with acute hypoxic respiratory failure, multifactorial in the setting of heart failure exacerbation, A. fib with RVR requiring amiodarone drip and cardioversion.  Patient noted to have chronic compression fracture of thoracic vertebrae with C7 transverse process fracture more acutely, no indication for surgery per IR given chronic in nature.  Unfortunately during hospital stay patient also had notable left-sided breast mass requiring biopsy, oncology following with concern for breast cancer, biopsy results pending.  Patient's creatinine was minimally elevated above baseline in the setting of diuresis and poor p.o. intake.  At this time patient has tolerated cardioversion quite well, A. fib is rate controlled on p.o. amiodarone continues  on Eliquis, be discharged on oxycodone for spinal fractures and severe pain, hopefully close follow-up with oncology in the next few days for results with Dr. Marin Olp as there is some concern the fractures could be secondary to metastatic disease which may improve with treatment and possibly even radiation.  Close follow-up with PCP, cardiology and oncology in the next few days to week pending schedule and availability.  We have discussed at length the need for appropriate nutrition and volume status in the setting of recent diagnosis concerning for cancer as well as heart failure exacerbation and acute kidney injury on CKD3b.  Patient otherwise stable and agreeable for discharge home.   Discharge Diagnoses:  Principal Problem:   Chest pain Active Problems:   Diabetes mellitus type 2, noninsulin dependent ([name])   Atrial fibrillation with RVR ([name])   Systolic CHF ([name])   CAD (coronary artery disease)   HLD (hyperlipidemia)   Acute on chronic congestive heart failure ([name])   Paroxysmal atrial fibrillation ([name])   CKD (chronic kidney disease) stage 3, GFR 30-59 ml/min   S/P CABG x 5   HTN (hypertension)   Breast cancer metastasized to bone ([name])   Goals of care, counseling/discussion    Discharge Instructions  Discharge Instructions    Call MD for:  difficulty breathing, headache or visual disturbances   Complete by: As directed    Call MD for:  extreme fatigue   Complete by: As directed    Call MD for:  hives   Complete by: As directed    Call MD for:  persistant dizziness or light-headedness   Complete by: As directed    Call MD for:  persistant nausea and vomiting   Complete by: As directed    Call MD for:  severe uncontrolled pain  Complete by: As directed    Call MD for:  temperature >100.4   Complete by: As directed    Diet - low sodium heart healthy   Complete by: As directed    Increase activity slowly   Complete by: As directed      Allergies as of 12/10/2019       Reactions   Levaquin [levofloxacin] Other (See Comments)   Suicidal thoughts, extreme anxiety, panic attack   Phenergan [promethazine Hcl] Other (See Comments)   Restless      Medication List    STOP taking these medications   azithromycin 250 MG tablet Commonly known as: ZITHROMAX   traMADol 50 MG tablet Commonly known as: ULTRAM     TAKE these medications   acetaminophen 500 MG tablet Commonly known as: TYLENOL Take 500-1,000 mg by mouth every 6 (six) hours as needed for headache (pain).   amiodarone 200 MG tablet Commonly known as: PACERONE Take 1 tablet (200 mg total) by mouth 2 (two) times daily.   Eliquis 5 MG Tabs tablet Generic drug: apixaban TAKE ONE TABLET BY MOUTH TWICE A DAY What changed: how much to take   furosemide 40 MG tablet Commonly known as: LASIX TAKE ONE TABLET BY MOUTH DAILY   glipiZIDE 5 MG tablet Commonly known as: GLUCOTROL Take 1 tablet (5 mg total) by mouth daily.   glucose blood test strip Commonly known as: True Metrix Blood Glucose Test Use as instructed twice daily   ondansetron 4 MG disintegrating tablet Commonly known as: Zofran ODT Take 1 tablet (4 mg total) by mouth every 8 (eight) hours as needed for nausea or vomiting.   oxyCODONE 5 MG immediate release tablet Commonly known as: Oxy IR/ROXICODONE Take 1 tablet (5 mg total) by mouth every 4 (four) hours as needed for up to 3 days for moderate pain or severe pain.   polyethylene glycol 17 g packet Commonly known as: MIRALAX / GLYCOLAX Take 17 g by mouth 2 (two) times daily.   rosuvastatin 10 MG tablet Commonly known as: CRESTOR Take 1 tablet (10 mg total) by mouth daily.   senna-docusate 8.6-50 MG tablet Commonly known as: Senokot-S Take 2 tablets by mouth at bedtime as needed for mild constipation or moderate constipation.   True Metrix Meter Devi 1 each by Does not apply route 2 (two) times daily.   TRUEplus Lancets 28G Misc Use as instructed twice daily.       Follow-up Information    [facility] Follow up on 12/19/2019.   Specialty: Cardiology Why: [facility] 1:30 PM Parking Garage Code [id number] Contact information: [address] [id number] [place] [phone]         Allergies  Allergen Reactions  . Levaquin [Levofloxacin] Other (See Comments)    Suicidal thoughts, extreme anxiety, panic attack  . Phenergan [Promethazine Hcl] Other (See Comments)    Restless    Consultations:  Cardiology, Dr Haroldine Laws  Oncology Dr. Marin Olp   Procedures/Studies: NM Bone Scan Whole Body  Result Date: 12/06/2019 CLINICAL DATA:  T-spine compression fracture. EXAM: NUCLEAR MEDICINE WHOLE BODY BONE SCAN TECHNIQUE: Whole body anterior and posterior images were obtained approximately 3 hours after intravenous injection of radiopharmaceutical. RADIOPHARMACEUTICALS:  21.4 mCi Technetium-28m MDP IV COMPARISON:  CT 12/04/2019. FINDINGS: Bilateral renal function excretion. Punctate focus of increased activity noted over the right frontal skull. Punctate area of increased activity noted over a mid right anterior mid rib and  possibly mid left anterior rib. Subtle punctate areas of increased activity over the lower cervical/upper thoracic cannot be excluded. Punctate area of increased activity noted over the upper lumbar spine. The possibility of metastatic disease presenting in this fashion cannot be excluded. Increased activity noted over the medial right clavicle and manubrium. This may be degenerative. Mild increased activity noted about the medial right knee joint space most likely degenerative. IMPRESSION: 1. Punctate areas of increased activity noted over the right frontal skull, mid right anterior rib and possibly mid left anterior rib. Subtle punctate areas of increased activity over the lower cervical/upper thoracic spine cannot be excluded. Punctate area of  increased activity noted over the upper lumbar spine. The possibility metastatic disease presenting in this fashion cannot be excluded. 2. Increased activity noted over the medial right clavicle and manubrium. These changes may be degenerative. Punctate area of increased activity noted over the medial right knee joint space most likely degenerative. Plain film images of the above described areas may prove useful for further evaluation. Electronically Signed   By: Marcello Moores  Register   On: 12/06/2019 14:06   IR US Guide Bx Asp/Drain  Result Date: 12/08/2019 CLINICAL DATA:  Left breast mass.  Osseous metastatic disease. EXAM: ULTRASOUND GUIDED CORE BIOPSY OF LEFT BREAST MASS MEDICATIONS: Lidocaine 1% subcutaneous PROCEDURE: The procedure, risks, benefits, and alternatives were explained to the patient. Questions regarding the procedure were encouraged and answered. The patient understands and consents to the procedure. Hypoechoic upper left breast mass was localized corresponding to CT findings. Appropriate skin entry site was determined and marked. The operative field was prepped with Betadine in a sterile fashion, and a sterile drape was applied covering the operative field. A sterile gown and sterile gloves were used for the procedure. Local anesthesia was provided with 1% Lidocaine. Under real-time ultrasound guidance, a 17 gauge trocar needle was advanced to the margin of the lesion. Once needle tip position was confirmed, coaxial 18-gauge core biopsy samples were obtained, submitted in formalin to surgical pathology. The guide needle was removed. Postprocedure scan shows no hemorrhage or other apparent complication. The patient tolerated the procedure well. COMPLICATIONS: None. FINDINGS: Hypoechoic upper left breast mass localized. Core biopsy samples obtained as above. IMPRESSION: 1. Technically successful core biopsy, left breast mass. Electronically Signed   By: Lucrezia Europe M.D.   On: 12/08/2019 09:34   DG  Chest Port 1 View  Result Date: 12/04/2019 CLINICAL DATA:  Right-sided chest pain. EXAM: PORTABLE CHEST 1 VIEW COMPARISON:  02/10/2017 FINDINGS: Stable heart size and appearance of pacing/ICD device and left atrial appendage clip. Status post prior CABG. Lungs demonstrate evidence pulmonary interstitial edema. No focal airspace consolidation, pleural fluid or pneumothorax identified. IMPRESSION: Pulmonary interstitial edema. Electronically Signed   By: Aletta Edouard M.D.   On: 12/04/2019 14:17   CT ANGIO CHEST AORTA W/CM &/OR WO/CM  Result Date: 12/04/2019 CLINICAL DATA:  Chest pain and back pain. Aortic dissection suspected. EXAM: CT ANGIOGRAPHY CHEST WITH CONTRAST TECHNIQUE: Multidetector CT imaging of the chest was performed using the standard protocol during bolus administration of intravenous contrast. Multiplanar CT image reconstructions and MIPs were obtained to evaluate the vascular anatomy. CONTRAST:  146mL OMNIPAQUE IOHEXOL 350 MG/ML SOLN COMPARISON:  None. FINDINGS: Cardiovascular: There is no evidence for a thoracic aortic dissection. There is no evidence for thoracic aortic aneurysm. Atherosclerotic changes are noted of the thoracic aorta. The main pulmonary artery is slightly dilated. There is no large centrally located pulmonary embolism. Detection of smaller pulmonary  emboli is limited by technique. The heart is enlarged. There is no significant pericardial effusion. The patient is status post prior CABG. Coronary artery calcifications are noted. 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 There is reflux of contrast in the IVC consistent with underlying cardiac dysfunction. Mediastinum/Nodes: --mildly prominent mediastinal and hilar lymph nodes are noted. For example there is a precarinal lymph node measuring approximately 1.4 cm. These  are presumably reactive. --No axillary lymphadenopathy. --No supraclavicular lymphadenopathy. --Normal thyroid gland. --The esophagus is unremarkable Lungs/Pleura: There is a small right-sided pleural effusion. There is some mild bilateral interlobular septal thickening. There is a probable trace left-sided pleural effusion. There is no pneumothorax. Upper Abdomen: There may be cholelithiasis within the partially visualized gallbladder. Musculoskeletal: There is a questionable 4.1 x 2.4 cm left-sided breast mass (axial series 5, image 44). There are acute appearing compression fractures of the T4 and T5 vertebral bodies. There is a questionable fracture of the T3 vertebral body. There is an acute fracture of the C7 transverse process. There is a questionable fracture of the superior endplate of the C7 vertebral body. Review of the MIP images confirms the above findings. IMPRESSION: 1. There is no evidence for a thoracic aortic dissection. 2. There are acute appearing compression fractures of the T4 and T5 vertebral bodies. There is a questionable fracture of the T3 vertebral body. There is an acute fracture of the C7 transverse process. There is a questionable fracture through the superior endplate of the C7 vertebral body. 3. There is a questionable 4.1 x 2.4 cm left-sided breast mass. Correlation with physical exam and mammography is recommended. 4. Cardiomegaly with small right-sided pleural effusion and mild bilateral interlobular septal thickening, suggestive of interstitial edema. 5. Reflux of contrast in the IVC consistent with underlying cardiac dysfunction. 6. 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 7. Possible cholelithiasis within the partially visualized gallbladder. Aortic Atherosclerosis (ICD10-I70.0). Electronically Signed   By: Constance Holster M.D.   On: 12/04/2019 21:31   ECHOCARDIOGRAM COMPLETE  Result Date: 12/06/2019    ECHOCARDIOGRAM REPORT   Patient Name:   [name] Date of Exam: 12/06/2019 Medical Rec #:  [id number]        Height:       64.0 in Accession #:    [id number]       Weight:       176.1 lb Date of Birth:  [birth year]        BSA:          1.853 m Patient Age:    74 years         BP:           100/68 mmHg Patient Gender: F                HR:           90 bpm. Exam Location:  Inpatient Procedure: 2D Echo Indications:    acute systolic chf [id number]  History:        Patient has prior history of Echocardiogram examinations, most                 recent 06/22/2019. Prior CABG and Defibrillator, chronic kidney  disease, Arrythmias:Atrial Fibrillation, Signs/Symptoms:Chest                 Pain; Risk Factors:Hypertension, Dyslipidemia and Diabetes.  Sonographer:    Johny Chess Referring Phys: [place]  1. Left ventricular ejection fraction, by estimation, is 35 to 40%. The left ventricle has moderately decreased function. The left ventricle demonstrates regional wall motion abnormalities (see scoring diagram/findings for description). There is severe asymmetric left ventricular hypertrophy. Left ventricular diastolic function could not be evaluated.  2. Right ventricular systolic function is low normal. The right ventricular size is normal. There is severely elevated pulmonary artery systolic pressure.  3. Left atrial size was severely dilated.  4. Right atrial size was mildly dilated.  5. The mitral valve is degenerative. Mild mitral valve regurgitation. No evidence of mitral stenosis.  6. Tricuspid valve regurgitation is moderate.  7. The aortic valve is bicuspid. Aortic valve regurgitation is not visualized. Mild aortic valve sclerosis is present, with no evidence of aortic valve stenosis.  8. The inferior vena cava is dilated in size with <50% respiratory variability, suggesting right  atrial pressure of 15 mmHg. Conclusion(s)/Recommendation(s): EF has decreased since prior echo. FINDINGS  Left Ventricle: Left ventricular ejection fraction, by estimation, is 35 to 40%. The left ventricle has moderately decreased function. The left ventricle demonstrates regional wall motion abnormalities. The left ventricular internal cavity size was normal in size. There is severe asymmetric left ventricular hypertrophy. Left ventricular diastolic function could not be evaluated due to atrial fibrillation. Left ventricular diastolic function could not be evaluated. Right Ventricle: The right ventricular size is normal. No increase in right ventricular wall thickness. Right ventricular systolic function is low normal. There is severely elevated pulmonary artery systolic pressure. The tricuspid regurgitant velocity is 3.75 m/s, and with an assumed right atrial pressure of 15 mmHg, the estimated right ventricular systolic pressure is [id number] mmHg. Left Atrium: Left atrial size was severely dilated. Right Atrium: Right atrial size was mildly dilated. Pericardium: There is no evidence of pericardial effusion. Mitral Valve: The mitral valve is degenerative in appearance. There is moderate thickening of the mitral valve leaflet(s). There is moderate calcification of the mitral valve leaflet(s). Severe mitral annular calcification. Mild mitral valve regurgitation. No evidence of mitral valve stenosis. Tricuspid Valve: The tricuspid valve is normal in structure. Tricuspid valve regurgitation is moderate . No evidence of tricuspid stenosis. Aortic Valve: The aortic valve is bicuspid. . There is mild thickening and moderate calcification of the aortic valve. Aortic valve regurgitation is not visualized. Mild aortic valve sclerosis is present, with no evidence of aortic valve stenosis. There is mild thickening of the aortic valve. There is moderate calcification of the aortic valve. Pulmonic Valve: The pulmonic valve was  grossly normal. Pulmonic valve regurgitation is mild. No evidence of pulmonic stenosis. Aorta: The aortic root, ascending aorta and aortic arch are all structurally normal, with no evidence of dilitation or obstruction. Venous: The inferior vena cava is dilated in size with less than 50% respiratory variability, suggesting right atrial pressure of 15 mmHg. IAS/Shunts: No atrial level shunt detected by color flow Doppler. Additional Comments: A pacer wire is visualized in the right atrium and right ventricle.  LEFT VENTRICLE PLAX 2D LVIDd:         4.80 cm LVIDs:         3.90 cm LV PW:         1.10 cm LV IVS:        1.85 cm  LVOT diam:     1.90 cm LV SV:         40 LV SV Index:   22 LVOT Area:     2.84 cm  LEFT ATRIUM              Index       RIGHT ATRIUM           Index LA diam:        4.50 cm  2.43 cm/m  RA Area:     19.10 cm LA Vol (A2C):   96.2 ml  51.91 ml/m RA Volume:   49.50 ml  26.71 ml/m LA Vol (A4C):   94.1 ml  50.77 ml/m LA Biplane Vol: 101.0 ml 54.50 ml/m  AORTIC VALVE LVOT Vmax:   83.50 cm/s LVOT Vmean:  51.200 cm/s LVOT VTI:    0.141 m TRICUSPID VALVE TR Peak grad:   56.2 mmHg TR Vmax:        375.00 cm/s  SHUNTS Systemic VTI:  0.14 m Systemic Diam: 1.90 cm Buford Dresser MD Electronically signed by Buford Dresser MD Signature Date/Time: 12/06/2019/4:48:28 PM    Final     Subjective: Mild ongoing nausea and pain moderately well controlled on current regimen, otherwise denies fevers, chills, shortness of breath, headache, vomiting.  Discharge Exam: Vitals:   12/09/19 2115 12/10/19 0527  BP: 136/66 (!) 156/88  Pulse: 94 94  Resp: 18 20  Temp: 98.5 F (36.9 C) 98.5 F (36.9 C)  SpO2: 95% 96%   Vitals:   12/09/19 1325 12/09/19 1502 12/09/19 2115 12/10/19 0527  BP: 133/84 (!) 156/85 136/66 (!) 156/88  Pulse: 74 95 94 94  Resp: (!) 21 18 18 20   Temp:  (!) 97.4 F (36.3 C) 98.5 F (36.9 C) 98.5 F (36.9 C)  TempSrc:  Oral Oral Oral  SpO2: 92% 95% 95% 96%  Weight:     83.8 kg  Height:        General: Pt is alert, awake, not in acute distress Cardiovascular: RRR, S1/S2 +, no rubs, no gallops Respiratory: CTA bilaterally, no wheezing, no rhonchi Abdominal: Soft, NT, ND, bowel sounds + Extremities: no edema, no cyanosis    The results of significant diagnostics from this hospitalization (including imaging, microbiology, ancillary and laboratory) are listed below for reference.     Microbiology: Recent Results (from the past 240 hour(s))  Respiratory Panel by RT PCR (Flu A&B, Covid) - Nasopharyngeal Swab     Status: None   Collection Time: 12/04/19  2:06 PM   Specimen: Nasopharyngeal Swab  Result Value Ref Range Status   SARS Coronavirus 2 by RT PCR NEGATIVE NEGATIVE Final    Comment: (NOTE) SARS-CoV-2 target nucleic acids are NOT DETECTED. The SARS-CoV-2 RNA is generally detectable in upper respiratoy specimens during the acute phase of infection. The lowest concentration of SARS-CoV-2 viral copies this assay can detect is 131 copies/mL. A negative result does not preclude SARS-Cov-2 infection and should not be used as the sole basis for treatment or other patient management decisions. A negative result may occur with  improper specimen collection/handling, submission of specimen other than nasopharyngeal swab, presence of viral mutation(s) within the areas targeted by this assay, and inadequate number of viral copies (<131 copies/mL). A negative result must be combined with clinical observations, patient history, and epidemiological information. The expected result is Negative. Fact Sheet for Patients:  PinkCheek.be Fact Sheet for Healthcare Providers:  GravelBags.it This test is not yet ap proved or cleared by  the [employer] and  has been authorized for detection and/or diagnosis of SARS-CoV-2 by FDA under an Emergency Use Authorization (EUA). This EUA will remain  in effect  (meaning this test can be used) for the duration of the COVID-19 declaration under Section 564(b)(1) of the Act, 21 U.S.C. section 360bbb-3(b)(1), unless the authorization is terminated or revoked sooner.    Influenza A by PCR NEGATIVE NEGATIVE Final   Influenza B by PCR NEGATIVE NEGATIVE Final    Comment: (NOTE) The Xpert Xpress SARS-CoV-2/FLU/RSV assay is intended as an aid in  the diagnosis of influenza from Nasopharyngeal swab specimens and  should not be used as a sole basis for treatment. Nasal washings and  aspirates are unacceptable for Xpert Xpress SARS-CoV-2/FLU/RSV  testing. Fact Sheet for Patients: PinkCheek.be Fact Sheet for Healthcare Providers: GravelBags.it This test is not yet approved or cleared by the [place] FDA and  has been authorized for detection and/or diagnosis of SARS-CoV-2 by  FDA under an Emergency Use Authorization (EUA). This EUA will remain  in effect (meaning this test can be used) for the duration of the  Covid-19 declaration under Section 564(b)(1) of the Act, 21  U.S.C. section 360bbb-3(b)(1), unless the authorization is  terminated or revoked. Performed at [facility], [place] [address], [place], [place] [address]      Labs: BNP (last 3 results) Recent Labs    12/04/19 1324  BNP [id number]*   Basic Metabolic Panel: Recent Labs  Lab 12/04/19 1637 12/05/19 0442 12/06/19 0402 12/07/19 0512 12/08/19 0307 12/09/19 0345 12/10/19 0646  NA  --    < > 138 135 134* 133* 132*  K  --    < > 3.6 4.5 4.6 4.2 4.3  CL  --    < > 103 101 104 101 98  CO2  --    < > 23 22 19* 21* 20*  GLUCOSE  --    < > 141* 202* 130* 109* 176*  BUN  --    < > 23 33* 36* 43* 52*  CREATININE  --    < > 1.66* 1.95* 1.82* 2.08* 2.19*  CALCIUM  --    < > 9.0 9.0 7.9* 7.7* 7.6*  MG 1.7  --  1.7 2.0 1.9 2.1 2.3  PHOS 4.4  --   --   --   --   --   --    < > = values in this interval not displayed.    Liver Function Tests: Recent Labs  Lab 12/05/19 0442 12/06/19 0402 12/07/19 0512 12/08/19 0307  AST 33 28 28 27   ALT 28 25 25 21   ALKPHOS 103 101 115 98  BILITOT 1.2 1.1 0.9 0.7  PROT 6.7 6.5 6.7 6.1*  ALBUMIN 3.3* 3.3* 3.6 3.2*   No results for input(s): LIPASE, AMYLASE in the last 168 hours. No results for input(s): AMMONIA in the last 168 hours. CBC: Recent Labs  Lab 12/06/19 0402 12/07/19 0512 12/08/19 0307 12/09/19 0345 12/10/19 0646  WBC 7.8 9.5 9.3 12.2* 10.6*  HGB 11.0* 11.1* 10.7* 10.9* 10.9*  HCT 36.8 36.6 35.3* 36.1 36.6  MCV 88.7 89.3 89.1 89.1 88.2  PLT 340 337 267 294 322   Cardiac Enzymes: No results for input(s): CKTOTAL, CKMB, CKMBINDEX, TROPONINI in the last 168 hours. BNP: Invalid input(s): POCBNP CBG: Recent Labs  Lab 12/09/19 1122 12/09/19 1732 12/09/19 2058 12/10/19 0731 12/10/19 1134  GLUCAP 107* 200* 184* 182* 137*   D-Dimer No  results for input(s): DDIMER in the last 72 hours. Hgb A1c No results for input(s): HGBA1C in the last 72 hours. Lipid Profile No results for input(s): CHOL, HDL, LDLCALC, TRIG, CHOLHDL, LDLDIRECT in the last 72 hours. Thyroid function studies No results for input(s): TSH, T4TOTAL, T3FREE, THYROIDAB in the last 72 hours.  Invalid input(s): FREET3 Anemia work up No results for input(s): VITAMINB12, FOLATE, FERRITIN, TIBC, IRON, RETICCTPCT in the last 72 hours. Urinalysis    Component Value Date/Time   COLORURINE YELLOW 12/04/2019 1911   APPEARANCEUR HAZY (A) 12/04/2019 1911   LABSPEC 1.010 12/04/2019 1911   PHURINE 5.0 12/04/2019 1911   GLUCOSEU NEGATIVE 12/04/2019 1911   HGBUR NEGATIVE 12/04/2019 1911   BILIRUBINUR NEGATIVE 12/04/2019 1911   KETONESUR NEGATIVE 12/04/2019 1911   PROTEINUR 30 (A) 12/04/2019 1911   UROBILINOGEN 1.0 05/11/2015 2209   NITRITE NEGATIVE 12/04/2019 1911   LEUKOCYTESUR MODERATE (A) 12/04/2019 1911   Sepsis Labs Invalid input(s): PROCALCITONIN,  WBC,   LACTICIDVEN Microbiology Recent Results (from the past 240 hour(s))  Respiratory Panel by RT PCR (Flu A&B, Covid) - Nasopharyngeal Swab     Status: None   Collection Time: 12/04/19  2:06 PM   Specimen: Nasopharyngeal Swab  Result Value Ref Range Status   SARS Coronavirus 2 by RT PCR NEGATIVE NEGATIVE Final    Comment: (NOTE) SARS-CoV-2 target nucleic acids are NOT DETECTED. The SARS-CoV-2 RNA is generally detectable in upper respiratoy specimens during the acute phase of infection. The lowest concentration of SARS-CoV-2 viral copies this assay can detect is 131 copies/mL. A negative result does not preclude SARS-Cov-2 infection and should not be used as the sole basis for treatment or other patient management decisions. A negative result may occur with  improper specimen collection/handling, submission of specimen other than nasopharyngeal swab, presence of viral mutation(s) within the areas targeted by this assay, and inadequate number of viral copies (<131 copies/mL). A negative result must be combined with clinical observations, patient history, and epidemiological information. The expected result is Negative. Fact Sheet for Patients:  PinkCheek.be Fact Sheet for Healthcare Providers:  GravelBags.it This test is not yet ap proved or cleared by the [place] FDA and  has been authorized for detection and/or diagnosis of SARS-CoV-2 by FDA under an Emergency Use Authorization (EUA). This EUA will remain  in effect (meaning this test can be used) for the duration of the COVID-19 declaration under Section 564(b)(1) of the Act, 21 U.S.C. section 360bbb-3(b)(1), unless the authorization is terminated or revoked sooner.    Influenza A by PCR NEGATIVE NEGATIVE Final   Influenza B by PCR NEGATIVE NEGATIVE Final    Comment: (NOTE) The Xpert Xpress SARS-CoV-2/FLU/RSV assay is intended as an aid in  the diagnosis of influenza  from Nasopharyngeal swab specimens and  should not be used as a sole basis for treatment. Nasal washings and  aspirates are unacceptable for Xpert Xpress SARS-CoV-2/FLU/RSV  testing. Fact Sheet for Patients: PinkCheek.be Fact Sheet for Healthcare Providers: GravelBags.it This test is not yet approved or cleared by the [place] FDA and  has been authorized for detection and/or diagnosis of SARS-CoV-2 by  FDA under an Emergency Use Authorization (EUA). This EUA will remain  in effect (meaning this test can be used) for the duration of the  Covid-19 declaration under Section 564(b)(1) of the Act, 21  U.S.C. section 360bbb-3(b)(1), unless the authorization is  terminated or revoked. Performed at [facility], [place] [address], [place], [place] [address]  Time coordinating discharge: Over 30 minutes  SIGNED:   Little Ishikawa, DO [facility] 12/10/2019, 1:44 PM

## 2019-12-12 ENCOUNTER — Other Ambulatory Visit: Payer: Self-pay | Admitting: Hematology & Oncology

## 2019-12-12 DIAGNOSIS — C50911 Malignant neoplasm of unspecified site of right female breast: Secondary | ICD-10-CM

## 2019-12-13 ENCOUNTER — Encounter: Payer: Self-pay | Admitting: *Deleted

## 2019-12-13 NOTE — Progress Notes (Signed)
Reached out to [name] to introduce myself as the office RN Navigator and explain our new patient process. Reviewed the reason for their referral and scheduled their new patient appointment along with labs. Provided address and directions to the office including call back phone number. Reviewed with patient any concerns they may have or any possible barriers to attending their appointment.   Informed patient about my role as a navigator and that I will meet with them prior to their New Patient appointment and more fully discuss what services I can provide. At this time patient has no further questions or needs.   Patient is unable to find transportation to her PET and her appointment with [place]. Message sent to Ginette Otto to help provide patient with transportation to these appointments. They will reach out. I will follow up with patient later this week to make sure plans are in place.   [facility] request sent for specimen [id number] dos 12/07/2019 per Dr Antonieta Pert request.

## 2019-12-15 LAB — CULTURE, BLOOD (ROUTINE X 2)
Culture: NO GROWTH
Culture: NO GROWTH
Special Requests: ADEQUATE
Special Requests: ADEQUATE

## 2019-12-16 ENCOUNTER — Encounter: Payer: Self-pay | Admitting: *Deleted

## 2019-12-16 ENCOUNTER — Other Ambulatory Visit: Payer: Self-pay | Admitting: *Deleted

## 2019-12-16 MED ORDER — ONDANSETRON 8 MG PO TBDP: 8.0000 mg | ORAL_TABLET | Freq: Three times a day (TID) | ORAL | 0 refills | Status: DC | PRN

## 2019-12-16 MED ORDER — OXYCODONE HCL 5 MG PO TABS: 5.0000 mg | ORAL_TABLET | ORAL | 0 refills | Status: DC | PRN

## 2019-12-16 NOTE — Progress Notes (Signed)
Patient calling asking about pain management through the weekend, and until she see's Dr Marin Olp. When discharged from the hospital she was only given a few day supply. Spoke to Dr Marin Olp who will send patient refills today with a longer supply to last until she is seen on 12/26/2019.   While on the phone, I asked about whether or not she had heard from transportation. She had not.   Called and spoke to [place] in the transportation program. All needed information given and patient signed up for transportion assistance. They will reach out and confirm with patient her enrollment.   Called and notified patient that her refills had been sent. Confirmed with her at that time that time that she had heard from transportation and she had.

## 2019-12-19 ENCOUNTER — Other Ambulatory Visit: Payer: Self-pay

## 2019-12-19 ENCOUNTER — Ambulatory Visit (HOSPITAL_COMMUNITY)
Admit: 2019-12-19 | Discharge: 2019-12-19 | Disposition: A | Discharge status: Home / Self Care | Payer: PPO | Attending: Adult Health | Admitting: Adult Health

## 2019-12-19 ENCOUNTER — Encounter (HOSPITAL_COMMUNITY): Payer: Self-pay

## 2019-12-19 VITALS — BP 142/90 | HR 87 | Wt 185.6 lb

## 2019-12-19 DIAGNOSIS — Z8674 Personal history of sudden cardiac arrest: Secondary | ICD-10-CM | POA: Diagnosis not present

## 2019-12-19 DIAGNOSIS — I252 Old myocardial infarction: Secondary | ICD-10-CM | POA: Diagnosis not present

## 2019-12-19 DIAGNOSIS — I5022 Chronic systolic (congestive) heart failure: Secondary | ICD-10-CM

## 2019-12-19 DIAGNOSIS — C50919 Malignant neoplasm of unspecified site of unspecified female breast: Secondary | ICD-10-CM | POA: Insufficient documentation

## 2019-12-19 DIAGNOSIS — I5032 Chronic diastolic (congestive) heart failure: Secondary | ICD-10-CM | POA: Diagnosis not present

## 2019-12-19 DIAGNOSIS — Z79899 Other long term (current) drug therapy: Secondary | ICD-10-CM | POA: Diagnosis not present

## 2019-12-19 DIAGNOSIS — Z9119 Patient's noncompliance with other medical treatment and regimen: Secondary | ICD-10-CM | POA: Insufficient documentation

## 2019-12-19 DIAGNOSIS — E119 Type 2 diabetes mellitus without complications: Secondary | ICD-10-CM | POA: Diagnosis not present

## 2019-12-19 DIAGNOSIS — I2510 Atherosclerotic heart disease of native coronary artery without angina pectoris: Secondary | ICD-10-CM | POA: Diagnosis not present

## 2019-12-19 DIAGNOSIS — I5042 Chronic combined systolic (congestive) and diastolic (congestive) heart failure: Secondary | ICD-10-CM | POA: Insufficient documentation

## 2019-12-19 DIAGNOSIS — Z951 Presence of aortocoronary bypass graft: Secondary | ICD-10-CM | POA: Diagnosis not present

## 2019-12-19 DIAGNOSIS — Z803 Family history of malignant neoplasm of breast: Secondary | ICD-10-CM | POA: Diagnosis not present

## 2019-12-19 DIAGNOSIS — I480 Paroxysmal atrial fibrillation: Secondary | ICD-10-CM

## 2019-12-19 DIAGNOSIS — Z7901 Long term (current) use of anticoagulants: Secondary | ICD-10-CM | POA: Diagnosis not present

## 2019-12-19 DIAGNOSIS — Z87891 Personal history of nicotine dependence: Secondary | ICD-10-CM | POA: Diagnosis not present

## 2019-12-19 DIAGNOSIS — I422 Other hypertrophic cardiomyopathy: Secondary | ICD-10-CM | POA: Diagnosis not present

## 2019-12-19 DIAGNOSIS — C7951 Secondary malignant neoplasm of bone: Secondary | ICD-10-CM | POA: Diagnosis not present

## 2019-12-19 DIAGNOSIS — Z8249 Family history of ischemic heart disease and other diseases of the circulatory system: Secondary | ICD-10-CM | POA: Insufficient documentation

## 2019-12-19 DIAGNOSIS — Z95810 Presence of automatic (implantable) cardiac defibrillator: Secondary | ICD-10-CM

## 2019-12-19 DIAGNOSIS — Z7984 Long term (current) use of oral hypoglycemic drugs: Secondary | ICD-10-CM | POA: Diagnosis not present

## 2019-12-19 DIAGNOSIS — Z833 Family history of diabetes mellitus: Secondary | ICD-10-CM | POA: Insufficient documentation

## 2019-12-19 LAB — BASIC METABOLIC PANEL
Anion gap: 10 (ref 5–15)
BUN: 28 mg/dL — ABNORMAL HIGH (ref 8–23)
CO2: 23 mmol/L (ref 22–32)
Calcium: 8.7 mg/dL — ABNORMAL LOW (ref 8.9–10.3)
Chloride: 107 mmol/L (ref 98–111)
Creatinine, Ser: 1.25 mg/dL — ABNORMAL HIGH (ref 0.44–1.00)
GFR calc Af Amer: 50 mL/min — ABNORMAL LOW (ref >60)
GFR calc non Af Amer: 43 mL/min — ABNORMAL LOW (ref >60)
Glucose, Bld: 87 mg/dL (ref 70–99)
Potassium: 4.6 mmol/L (ref 3.5–5.1)
Sodium: 140 mmol/L (ref 135–145)

## 2019-12-19 LAB — CBC
HCT: 36.4 % (ref 36.0–46.0)
Hemoglobin: 10.3 g/dL — ABNORMAL LOW (ref 12.0–15.0)
MCH: 25.4 pg — ABNORMAL LOW (ref 26.0–34.0)
MCHC: 28.3 g/dL — ABNORMAL LOW (ref 30.0–36.0)
MCV: 89.7 fL (ref 80.0–100.0)
Platelets: 299 10*3/uL (ref 150–400)
RBC: 4.06 MIL/uL (ref 3.87–5.11)
RDW: 17.5 % — ABNORMAL HIGH (ref 11.5–15.5)
WBC: 7.5 10*3/uL (ref 4.0–10.5)
nRBC: 0 % (ref 0.0–0.2)

## 2019-12-19 MED ORDER — FUROSEMIDE 80 MG PO TABS: 80.0000 mg | ORAL_TABLET | Freq: Every day | ORAL | 5 refills | Status: AC

## 2019-12-19 NOTE — Progress Notes (Signed)
Patient ID: [name], female   DOB: Jul 09, 1946, 74 y.o.   MRN: [id number]    Advanced Heart Failure Clinic Note    Primary HF Cardiologist: Dr Haroldine Laws EP: Dr Lovena Le Oncology: Dr Marin Olp   HPI: [name] is a 74 y.o. 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 Jude ICD 04/2015, & chronic diastolic CHF (EF 0000000 by echo, 45-50% by cath).   Admitted 09/17/15 with new rapid aflutter (previously PAF/VT) with plans for cardioversion. She was also noted to be volume overload in setting of non-compliance (thought to be dietary). Had TEE/DC-CV with return to NSR. She developed laryngospasm and required brief intubation but recovered in PACU post procedure. Enrolled in the ReDS vest trial,  Reading 53%. She continued to diurese with IV lasix and transitioned to lasix 40 mg twice a day. On the day of discharge Reds Vest reading was 39. Discharge weight was 160 pounds.   Admitted 11/27/16-01/08/17 with VF shocked appropriately by ICD. Underwent cath that showed severe 3 vessel disease She underwent CABG x 4, MAZE, and LAA clip. Post operatively developed AF with RVR and low output heart failure, initial co ox was 36%.Converted to NSR on amio. She was started on milrinone but unable to wean off successfully due to recurrent low output HF. Echo showed EF 45-50% with mild RV hypokinesis.  RHC performed on milrinone which showed primarily RV failure. Suspicion for amyloid raised. SPEP negative. Technetium pyrophosphate scan negative for TTR amyloid. Sent home on milrinone 0.375 mcg. Also had e. Coli UTI and treated with abx.  Home milrinone weaned off and PICC pulled in 03/2017.   Admitted 12/04/2019 with volume overload and A fib RVR. 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 Has follow up with Dr  Marin Olp.  Placed on amio drip and diuresed with IV lasix. She then underwent TEE-DC-CV. Diuresed with IV lasix. While hospitalized howed vegetation on pacer wire. Blood cultures were negative. Discharged on 12/10/19   Today she returns for post hospital HF follow up.Overall feeling fair. SOB with exertion.  Increased lower extremity edema. Denies PND/Orthopnea. Appetite fair. No fever or chills. Weight at home 184 pounds. Taking all medications except amiodarone. Says she never took amiodarone after she was discharged due to nausea.   ICD interrogation: No VT/ Impedance down.   [place] 01/02/17 Done on milrinone 0.161mcg/kg/min RA = 10  RV = 40/15 PA = 42/15 (28) PCW = 16 Fick cardiac output/index = 6.1/3.3 Thermo CO/CI = 5.0/2.7 PVR = 2.4 FA sat = 96% PA sat = 63%. 64%   09/2015 TEE EF 35-40% No evidence of thrombus  09/18/2015 ECHO EF 45-50%   Labs 09/24/2015: K 4.1 Creatinine 1.33   Review of systems complete and found to be negative unless listed in HPI.    SH:  Social History   Socioeconomic History  . Marital status: Divorced    Spouse name: Not on file  . Number of children: Not on file  . Years of education: Not on file  . Highest education level: Not on file  Occupational History  . Not on file  Tobacco Use  . Smoking status: Former Smoker    Years: 4.00    Quit date: 12/31/1980    Years since quitting: 38.9  . Smokeless tobacco: Never Used  Substance and Sexual Activity  . Alcohol  use: No  . Drug use: No  . Sexual activity: Not Currently  Other Topics Concern  . Not on file  Social History Narrative   Divorced.  Lives with daughter and grandson. 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   Social Determinants of Health   Financial Resource Strain:   . Difficulty of Paying Living Expenses:   Food Insecurity:   . Worried About Charity fundraiser in the Last Year:   . Arboriculturist in the Last Year:   Transportation Needs:   . Film/video editor  (Medical):   [name] Lack of Transportation (Non-Medical):   Physical Activity:   . Days of Exercise per Week:   . Minutes of Exercise per Session:   Stress:   . Feeling of Stress :   Social Connections:   . Frequency of Communication with Friends and Family:   . Frequency of Social Gatherings with Friends and Family:   . Attends Religious Services:   . Active Member of Clubs or Organizations:   . Attends Archivist Meetings:   [name] Marital Status:   Intimate Partner Violence:   . Fear of Current or Ex-Partner:   . Emotionally Abused:   [name] Physically Abused:   . Sexually Abused:     FH:  Family History  Problem Relation Age of Onset  . Breast cancer Mother   . Diabetes Father   . Stroke Father   . Heart disease Sister   . Healthy Sister   . Gastric cancer Maternal Uncle     Past Medical History:  Diagnosis Date  . Abnormal PFT   . Breast cancer metastasized to bone, unspecified laterality ([place]) 12/06/2019  . CAD (coronary artery disease), native coronary artery - 3 vessel 04/21/2015   a. NSTEMI 8-04/2015 felt 2/2 demand ischemia. b. 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  . Chronic diastolic CHF (congestive heart failure) ([place])   . Diabetes mellitus ([place])   . Goals of care, counseling/discussion 12/06/2019  . Hypertrophic cardiomyopathy ([place])   . Left atrial enlargement   . Mitral regurgitation    a. Echo 04/2015: moderate mitral regurgitation. b. F/u echo 05/2015: mild MR.  . NSTEMI (non-ST elevated myocardial infarction) ([facility]) 11/27/2016  . Paroxysmal atrial fibrillation ([name])   . Tricuspid regurgitation    a. Echo 04/2015: Mod-severe TR. b. Not mentioned on echo 05/2015  . Ventricular tachycardia ([place])    a. s/p St. Jude ICD implanted 04/2015.    Current Outpatient Medications  Medication Sig Dispense Refill  . acetaminophen (TYLENOL) 500 MG tablet Take 500-1,000 mg by mouth every 6 (six) hours as needed for  headache (pain).    . Blood Glucose Monitoring Suppl (TRUE METRIX METER) DEVI 1 each by Does not apply route 2 (two) times daily. 1 Device 0  . ELIQUIS 5 MG TABS tablet TAKE ONE TABLET BY MOUTH TWICE A DAY (Patient taking differently: Take 5 mg by mouth 2 (two) times daily. ) 180 tablet 1  . furosemide (LASIX) 40 MG tablet TAKE ONE TABLET BY MOUTH DAILY (Patient taking differently: Take 40 mg by mouth daily. ) 90 tablet 3  . glipiZIDE (GLUCOTROL) 5 MG tablet Take 1 tablet (5 mg total) by mouth daily. 90 tablet 1  . glucose blood (TRUE METRIX BLOOD GLUCOSE TEST) test strip Use as instructed twice daily 100 each 12  . ondansetron (ZOFRAN-ODT) 8 MG disintegrating tablet Take 1 tablet (  8 mg total) by mouth every 8 (eight) hours as needed for nausea or vomiting. 60 tablet 0  . oxyCODONE (OXY IR/ROXICODONE) 5 MG immediate release tablet Take 1 tablet (5 mg total) by mouth every 4 (four) hours as needed for moderate pain or severe pain. 60 tablet 0  . polyethylene glycol (MIRALAX / GLYCOLAX) 17 g packet Take 17 g by mouth 2 (two) times daily. 14 each 0  . senna-docusate (SENOKOT-S) 8.6-50 MG tablet Take 2 tablets by mouth at bedtime as needed for mild constipation or moderate constipation. 30 tablet 0  . TRUEPLUS LANCETS 28G MISC Use as instructed twice daily. 60 each 5   No current facility-administered medications for this encounter.   Vitals:   12/19/19 1400  BP: (!) 142/90  Pulse: 87  SpO2: 97%  Weight: 84.2 kg (185 lb 9.6 oz)   Wt Readings from Last 3 Encounters:  12/19/19 84.2 kg (185 lb 9.6 oz)  12/10/19 83.8 kg (184 lb 12.8 oz)  07/05/19 85.3 kg (188 lb)   General:  Arrived in wheel chair. No resp difficulty HEENT: normal Neck: supple. JVP 11-12 Carotids 2+ bilat; no bruits. No lymphadenopathy or thryomegaly appreciated. Cor: PMI nondisplaced. Regular rate & rhythm. No rubs, gallops or murmurs. Lungs: clear Abdomen: soft, nontender, nondistended. No hepatosplenomegaly. No bruits or  masses. Good bowel sounds. Extremities: no cyanosis, clubbing, rash, R and LLE 2+ edema Neuro: alert & orientedx3, cranial nerves grossly intact. moves all 4 extremities w/o difficulty. Affect pleasant  EKG: SR with 1st degree heart block 238 ms.   ASSESSMENT & PLAN: 1. 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 EF 45-50% post CABG 5/18 - She is s/p VF arrest and CABG in 4/18. Prolonged post-hospital course with low output HF despite relatively preserved EF. RHC suggestive of primarily RV failure.  - Technetium pyrophosphate scan negative for amyloid. - EF had recovered but now back down to 35-40%.  - ICD ok.  - NYHA III.  Volume status elevated. Increase lasix to 80 mg daily. Discussed low salt food choices.  - Has not tolerated b-blocker well. Refuses to rechallenge.  - No ARNi/spiro with recent AKI.   2. PAF - S/P DC-CV 09/20/15. -S/P DC-CV 12/09/19 Small Vegetation on RA lead.  - EKG today with SR with NSR. She does not want restart amio. Ok to keep off for now.  - Continue Eliquis for anticoagulation.  - Check CBC.   3. CAD - 3V CAD s/p CABG. Maze and LAA clipping 4/18 - No chest pain.  - No ASA with need for Eliquis. - Continue statin  4. H/O VT with ICD placed 04/2015. - s/p VF arrest 4/18 with ICD shock.  - No subsequent VT.   5. DM2  - Follows with PCP.  - Consider addition of Jardiance  6. Metastatic Breast Cancer  With multilevel Vertebral Compression Fractures.  - Has follow up with Dr Marin Olp  7. H/O AKI -Check BMET   8. Small Vegetation on RA Pacer Lead  Blood Cx negative on 12/10/19    Follow up in 2 weeks to reassess volume status.     Darrick Grinder, NP  2:06 PM

## 2019-12-19 NOTE — Patient Instructions (Addendum)
TAKE Lasix 80mg  (1 tab) daily  Labs today We will only contact you if something comes back abnormal or we need to make some changes. Otherwise no news is good news!  Your physician recommends that you schedule a follow-up appointment in: 2-3 weeks with the Nurse Practitioner/Physicians Assistant  Monday June 7th, 2021 at [place] code 5007  Please call office at [phone] option [phone] if you have any questions or concerns.   At the [facility], you and your health needs are our priority. As part of our continuing mission to provide you with exceptional heart care, we have created designated Provider Care Teams. These Care Teams include your primary Cardiologist (physician) and Advanced Practice Providers (APPs- Physician Assistants and Nurse Practitioners) who all work together to provide you with the care you need, when you need it.   You may see any of the following providers on your designated Care Team at your next follow up: [name] Dr Glori Bickers . Dr Loralie Champagne . Darrick Grinder, NP . Lyda Jester, PA . Audry Riles, PharmD   Please be sure to bring in all your medications bottles to every appointment.

## 2019-12-22 ENCOUNTER — Ambulatory Visit (HOSPITAL_COMMUNITY)
Admission: RE | Admit: 2019-12-22 | Discharge: 2019-12-22 | Disposition: A | Discharge status: Home / Self Care | Payer: PPO | Source: Ambulatory Visit | Attending: Hematology & Oncology | Admitting: Hematology & Oncology

## 2019-12-22 ENCOUNTER — Other Ambulatory Visit: Payer: Self-pay

## 2019-12-22 DIAGNOSIS — Z79899 Other long term (current) drug therapy: Secondary | ICD-10-CM | POA: Diagnosis not present

## 2019-12-22 DIAGNOSIS — I700 Atherosclerosis of aorta: Secondary | ICD-10-CM | POA: Diagnosis not present

## 2019-12-22 DIAGNOSIS — C50911 Malignant neoplasm of unspecified site of right female breast: Secondary | ICD-10-CM | POA: Diagnosis not present

## 2019-12-22 DIAGNOSIS — C7951 Secondary malignant neoplasm of bone: Secondary | ICD-10-CM | POA: Insufficient documentation

## 2019-12-22 DIAGNOSIS — C50919 Malignant neoplasm of unspecified site of unspecified female breast: Secondary | ICD-10-CM | POA: Diagnosis not present

## 2019-12-22 DIAGNOSIS — N8189 Other female genital prolapse: Secondary | ICD-10-CM | POA: Diagnosis not present

## 2019-12-22 LAB — GLUCOSE, CAPILLARY: Glucose-Capillary: 97 mg/dL (ref 70–99)

## 2019-12-22 MED ORDER — FLUDEOXYGLUCOSE F - 18 (FDG) INJECTION
9.1200 | Freq: Once | INTRAVENOUS | Status: AC
  Administered 2019-12-22: 9.12 via INTRAVENOUS

## 2019-12-26 ENCOUNTER — Other Ambulatory Visit: Payer: Self-pay

## 2019-12-26 ENCOUNTER — Inpatient Hospital Stay: Payer: PPO

## 2019-12-26 ENCOUNTER — Inpatient Hospital Stay: Payer: PPO | Attending: Hematology & Oncology | Admitting: Hematology & Oncology

## 2019-12-26 ENCOUNTER — Encounter: Payer: Self-pay | Admitting: Hematology & Oncology

## 2019-12-26 ENCOUNTER — Encounter: Payer: Self-pay | Admitting: *Deleted

## 2019-12-26 VITALS — BP 141/64 | HR 83 | Temp 96.8°F | Resp 19 | Wt 179.0 lb

## 2019-12-26 DIAGNOSIS — C50919 Malignant neoplasm of unspecified site of unspecified female breast: Secondary | ICD-10-CM | POA: Diagnosis not present

## 2019-12-26 DIAGNOSIS — C50911 Malignant neoplasm of unspecified site of right female breast: Secondary | ICD-10-CM

## 2019-12-26 DIAGNOSIS — F419 Anxiety disorder, unspecified: Secondary | ICD-10-CM | POA: Insufficient documentation

## 2019-12-26 DIAGNOSIS — Z170 Estrogen receptor positive status [ER+]: Secondary | ICD-10-CM | POA: Insufficient documentation

## 2019-12-26 DIAGNOSIS — I509 Heart failure, unspecified: Secondary | ICD-10-CM | POA: Diagnosis not present

## 2019-12-26 DIAGNOSIS — C50912 Malignant neoplasm of unspecified site of left female breast: Secondary | ICD-10-CM | POA: Diagnosis not present

## 2019-12-26 DIAGNOSIS — Z5111 Encounter for antineoplastic chemotherapy: Secondary | ICD-10-CM | POA: Insufficient documentation

## 2019-12-26 DIAGNOSIS — C7951 Secondary malignant neoplasm of bone: Secondary | ICD-10-CM | POA: Insufficient documentation

## 2019-12-26 LAB — CMP (CANCER CENTER ONLY)
ALT: 16 U/L (ref 0–44)
AST: 24 U/L (ref 15–41)
Albumin: 4.2 g/dL (ref 3.5–5.0)
Alkaline Phosphatase: 104 U/L (ref 38–126)
Anion gap: 10 (ref 5–15)
BUN: 43 mg/dL — ABNORMAL HIGH (ref 8–23)
CO2: 27 mmol/L (ref 22–32)
Calcium: 9.7 mg/dL (ref 8.9–10.3)
Chloride: 104 mmol/L (ref 98–111)
Creatinine: 1.79 mg/dL — ABNORMAL HIGH (ref 0.44–1.00)
GFR, Est AFR Am: 32 mL/min — ABNORMAL LOW (ref >60)
GFR, Estimated: 28 mL/min — ABNORMAL LOW (ref >60)
Glucose, Bld: 170 mg/dL — ABNORMAL HIGH (ref 70–99)
Potassium: 4.8 mmol/L (ref 3.5–5.1)
Sodium: 141 mmol/L (ref 135–145)
Total Bilirubin: 0.6 mg/dL (ref 0.3–1.2)
Total Protein: 7.4 g/dL (ref 6.5–8.1)

## 2019-12-26 LAB — CBC WITH DIFFERENTIAL (CANCER CENTER ONLY)
Abs Immature Granulocytes: 0.03 10*3/uL (ref 0.00–0.07)
Basophils Absolute: 0.1 10*3/uL (ref 0.0–0.1)
Basophils Relative: 2 %
Eosinophils Absolute: 0.3 10*3/uL (ref 0.0–0.5)
Eosinophils Relative: 5 %
HCT: 36.6 % (ref 36.0–46.0)
Hemoglobin: 10.8 g/dL — ABNORMAL LOW (ref 12.0–15.0)
Immature Granulocytes: 0 %
Lymphocytes Relative: 14 %
Lymphs Abs: 1 10*3/uL (ref 0.7–4.0)
MCH: 26 pg (ref 26.0–34.0)
MCHC: 29.5 g/dL — ABNORMAL LOW (ref 30.0–36.0)
MCV: 88 fL (ref 80.0–100.0)
Monocytes Absolute: 0.6 10*3/uL (ref 0.1–1.0)
Monocytes Relative: 8 %
Neutro Abs: 5.1 10*3/uL (ref 1.7–7.7)
Neutrophils Relative %: 71 %
Platelet Count: 289 10*3/uL (ref 150–400)
RBC: 4.16 MIL/uL (ref 3.87–5.11)
RDW: 17.6 % — ABNORMAL HIGH (ref 11.5–15.5)
WBC Count: 7.1 10*3/uL (ref 4.0–10.5)
nRBC: 0 % (ref 0.0–0.2)

## 2019-12-26 MED ORDER — FULVESTRANT 250 MG/5ML IM SOLN
INTRAMUSCULAR | Status: AC
  Filled 2019-12-26: qty 5

## 2019-12-26 MED ORDER — OXYCODONE HCL ER 20 MG PO T12A: 20.0000 mg | EXTENDED_RELEASE_TABLET | Freq: Two times a day (BID) | ORAL | 0 refills | Status: DC

## 2019-12-26 MED ORDER — RIBOCICLIB SUCC (400 MG DOSE) 200 MG PO TBPK: 400.0000 mg | ORAL_TABLET | Freq: Every day | ORAL | 0 refills | Status: DC

## 2019-12-26 MED ORDER — FULVESTRANT 250 MG/5ML IM SOLN
500.0000 mg | Freq: Once | INTRAMUSCULAR | Status: AC
  Administered 2019-12-26: 500 mg via INTRAMUSCULAR

## 2019-12-26 MED ORDER — OXYCODONE HCL 5 MG PO TABS: 5.0000 mg | ORAL_TABLET | Freq: Four times a day (QID) | ORAL | 0 refills | Status: DC | PRN

## 2019-12-26 NOTE — Progress Notes (Signed)
Hematology and Oncology Follow Up Visit  [name] [id number] 1946/01/31 74 y.o. 12/26/2019   Principle Diagnosis:   Metastatic breast cancer-ER positive/HER-2 positive -bone metastasis  Congestive heart failure  Current Therapy:    Faslodex 500 mg IM monthly -start on 12/26/2019  Ribociclib 400 mg p.o. daily x21 days on/7 days off. -Start on 01/03/2020  Xgeva 120 mg subcu q. 3 months-next dose in 01/22/2020  Palliative radiotherapy to the spine.     Interim History:  [name] is back for her first office visit.  I had seen her in the hospital in consultation.  She actually was admitted to the cardiology floor.  She was having severe back pain.  She does have significant congestive heart failure issues.  I think she has atrial fibrillation.  She was found to have a mass in the left breast.  She unfortunately has never had a mammogram.  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  We were able to get a biopsy of the left breast.  This was done on 12/14/2019.  The pathology report ([id number]) showed invasive ductal carcinoma.  The tumor was ER positive, PR positive, and HER-2 positive.  She did have a bone scan done while in the hospital.  Surprisingly, this was pretty much unrevealing.  There was some slight areas of uptake.  Her CA 27.29 was quite elevated at 700.  Unfortunately she cannot have an MRI because of her pacemaker.  Once she was discharged, she had a PET scan done as an outpatient on 12/22/2019.  This showed widespread hypermetabolic osseous metastasis.  She has a left breast lesion that was positive.  Otherwise, there is no disease noted elsewhere.  She will start radiation therapy on Wednesday.  It would be nice if we could incorporate Herceptin but she has a poor cardiac status.  Her performance status is really not that great.  I really do not think she would do well with systemic chemotherapy.  She has a lot  of stress at home.  Apparently she lives with a daughter who has psychiatric issues.  Her daughter has bipolar.  This is causing a lot of stress.  She seems to be eating okay.  She is not have any problems going to the bathroom.  Pain seems to be her big problem.  She is on oxycodone.  I am sure she will need some time-released pain medication.  She has these fractures in her back.  Unfortunately, I do not think that interventional radiology can do vertebroplasty on these.  Thankfully, she does have a strong faith.  I did give her a prayer blanket.  We always share prayer together whenever I saw her in the hospital.  Currently, her performance status is by ECOG 2-3.  Medications:  Current Outpatient Medications:  [name]  Magnesium Hydroxide (MILK OF MAGNESIA CONCENTRATE PO), Take by mouth every 8 (eight) hours as needed., Disp: , Rfl:  .  acetaminophen (TYLENOL) 500 MG tablet, Take 500-1,000 mg by mouth every 6 (six) hours as needed for headache (pain)., Disp: , Rfl:  .  Blood Glucose Monitoring Suppl (TRUE METRIX METER) DEVI, 1 each by Does not apply route 2 (two) times daily., Disp: 1 Device, Rfl: 0 .  ELIQUIS 5 MG TABS tablet, TAKE ONE TABLET BY MOUTH TWICE A DAY (Patient taking differently: Take 5 mg by mouth 2 (two) times daily. ), Disp: 180 tablet, Rfl: 1 .  furosemide (LASIX) 80 MG tablet,  Take 1 tablet (80 mg total) by mouth daily., Disp: 30 tablet, Rfl: 5 .  glipiZIDE (GLUCOTROL) 5 MG tablet, Take 1 tablet (5 mg total) by mouth daily., Disp: 90 tablet, Rfl: 1 .  glucose blood (TRUE METRIX BLOOD GLUCOSE TEST) test strip, Use as instructed twice daily, Disp: 100 each, Rfl: 12 .  ondansetron (ZOFRAN-ODT) 8 MG disintegrating tablet, Take 1 tablet (8 mg total) by mouth every 8 (eight) hours as needed for nausea or vomiting., Disp: 60 tablet, Rfl: 0 .  oxyCODONE (OXY IR/ROXICODONE) 5 MG immediate release tablet, Take 1 tablet (5 mg total) by mouth every 6 (six) hours as needed for moderate pain or  severe pain., Disp: 90 tablet, Rfl: 0 .  oxyCODONE (OXYCONTIN) 20 mg 12 hr tablet, Take 1 tablet (20 mg total) by mouth every 12 (twelve) hours., Disp: 60 tablet, Rfl: 0 .  ribociclib succ (KISQALI 400MG DAILY DOSE) 200 MG Therapy Pack, Take 2 tablets (400 mg total) by mouth daily for 21 days. Take for 21 days on, 7 days off, repeat every 28 days., Disp: 42 tablet, Rfl: 0 .  TRUEPLUS LANCETS 28G MISC, Use as instructed twice daily., Disp: 60 each, Rfl: 5  Allergies:  Allergies  Allergen Reactions  . Levaquin [Levofloxacin] Other (See Comments)    Suicidal thoughts, extreme anxiety, panic attack  . Phenergan [Promethazine Hcl] Other (See Comments)    Restless    Past Medical History, Surgical history, Social history, and Family History were reviewed and updated.  Review of Systems: Review of Systems  Constitutional: Positive for fatigue.  HENT:  Negative.   Eyes: Negative.   Respiratory: Positive for shortness of breath.   Cardiovascular: Positive for leg swelling.  Gastrointestinal: Negative.   Endocrine: Negative.   Genitourinary: Negative.    Musculoskeletal: Positive for back pain and myalgias.  Skin: Negative.   Neurological: Positive for dizziness.  Hematological: Negative.   Psychiatric/Behavioral: Positive for depression. The patient is nervous/anxious.     Physical Exam:  weight is 179 lb (81.2 kg). Her temporal temperature is 96.8 F (36 C) (abnormal). Her blood pressure is 141/64 (abnormal) and her pulse is 83. Her respiration is 19 and oxygen saturation is 96%.   Wt Readings from Last 3 Encounters:  12/26/19 179 lb (81.2 kg)  12/19/19 185 lb 9.6 oz (84.2 kg)  12/10/19 184 lb 12.8 oz (83.8 kg)    Physical Exam Vitals reviewed.  Constitutional:      Comments: On her breast exam, in the left breast, there is a palpable mass at about the 12 o'clock position.  This probably measures about 2 x 2 cm.  It is slightly mobile.  I cannot palpate any axillary adenopathy  in the left axilla.  Right breast is unremarkable.  HENT:     Head: Normocephalic and atraumatic.  Eyes:     Pupils: Pupils are equal, round, and reactive to light.  Cardiovascular:     Rate and Rhythm: Normal rate and regular rhythm.     Heart sounds: Normal heart sounds.     Comments: Cardiac exam shows an regular rate and rhythm.  She has an occasional extra beat.  She has a 2/6 systolic ejection murmur. Pulmonary:     Effort: Pulmonary effort is normal.     Breath sounds: Normal breath sounds.  Abdominal:     General: Bowel sounds are normal.     Palpations: Abdomen is soft.  Musculoskeletal:        General: No tenderness or  deformity. Normal range of motion.     Cervical back: Normal range of motion.  Lymphadenopathy:     Cervical: No cervical adenopathy.  Skin:    General: Skin is warm and dry.     Findings: No erythema or rash.  Neurological:     Mental Status: She is alert and oriented to person, place, and time.  Psychiatric:        Behavior: Behavior normal.        Thought Content: Thought content normal.        Judgment: Judgment normal.      Lab Results  Component Value Date   WBC 7.1 12/26/2019   HGB 10.8 (L) 12/26/2019   HCT 36.6 12/26/2019   MCV 88.0 12/26/2019   PLT 289 12/26/2019     Chemistry      Component Value Date/Time   NA 141 12/26/2019 1113   NA 140 08/09/2019 1024   K 4.8 12/26/2019 1113   CL 104 12/26/2019 1113   CO2 27 12/26/2019 1113   BUN 43 (H) 12/26/2019 1113   BUN 33 (H) 08/09/2019 1024   CREATININE 1.79 (H) 12/26/2019 1113   CREATININE 1.29 (H) 06/16/2016 1228      Component Value Date/Time   CALCIUM 9.7 12/26/2019 1113   ALKPHOS 104 12/26/2019 1113   AST 24 12/26/2019 1113   ALT 16 12/26/2019 1113   BILITOT 0.6 12/26/2019 1113       Impression and Plan: Ms. Cobbs is a very nice 74 year old white female.  She clearly is postmenopausal.  She is a former Marine scientist.  She has metastatic breast cancer.  Her tumor is triple  positive.  Again, I really would like to use Herceptin but her cardiac function is not that good right now.  I am sure cardiology will try their best to improve her cardiac function.  At least, we can use Faslodex.  We gave her a dose today.  Have also want to get her on ribociclib.  I would probably use the 400 mg dose.  I am not sure she could tolerate the 600 mg dose.  Sh got Zometa while in the hospital.  I will give her Delton See in the office.  Hopefully, radiation therapy will help her with the pain.  I would like to think that radiation therapy will be able to help.  I spent about 50 minutes with her today.  It was nice to see her.  She is having a lot of anxiety.  She is under a lot of stress at home.  She will come back in 2 weeks for Faslodex.  I will see her back in 4 weeks.  We will give her Faslodex and Xgeva at that time.  We will also check her CA 27.29.   Volanda Napoleon, MD 5/24/20212:19 PM

## 2019-12-26 NOTE — Progress Notes (Signed)
Initial RN Navigator Patient Visit  Name: [name] Date of Referral : Hospital Follow Up Diagnosis: Metastatic Breast Cancer  Met with patient prior to their visit with MD. Hanley Seamen patient "Your Patient Navigator" handout which explains my role, areas in which I am able to help, and all the contact information for myself and the office. Also gave patient MD and Navigator business card. Reviewed with patient the general overview of expected course after initial diagnosis and time frame for all steps to be completed.  New patient packet given to patient which includes: orientation to office and staff; campus directory; education on My Chart and Advance Directives; and patient centered education on breast cancer including [facility]  Patient completed visit with Dr. Marin Olp  - Patient started Faslodex today and prescription has been sent for [facility] to the [facility].  - Patient given samples of chocolate Ensure. Phone appointment with Nutrition made for 01/10/2020. - Message sent to transportation to arrange for transportation for her next appointment  Patient understands all follow up procedures and expectations. They have my number to reach out for any further clarification or additional needs.

## 2019-12-26 NOTE — Progress Notes (Signed)
Patient here for a consult with Dr. Sondra Come.              12/05/2019 Oncology consult  HPI: [name] is a 74 year old female with a past medical history significant for paroxysmal atrial fibrillation on Eliquis, combined systolic and diastolic CHF, ventricular tachycardia status post ICD placement in 2013, CAD status post CABG, hypertension, hyperlipidemia, hypertrophic cardiomyopathy.  She presented to the emergency room with worsening midsternal chest pain and upper back pain x1 month.  Labs on admission showed a hemoglobin of 11.2, glucose 187, creatinine 1.06, BNP 581.1.  CTA of the chest showed no evidence for thoracic aortic dissection, acute appearing compression fractures of the T4 and T5 vertebral bodies, questional fracture of the T3 vertebral body, acute fracture of the C7 transverse process, 4.1 x 2.4 cm left-sided breast mass, cardiomegaly with small right sided pleural effusion.  IR has been consulted for possible kyphoplasty.  They have recommended a bone scan.  Cardiology has been consulted due to her underlying CHF and is planning for cardioversion tomorrow.  Of note, the patient will need to remain on Eliquis uninterrupted for at least 1 month following cardioversion.  The patient reports that her back pain is better with her current pain medications.  Pain is primarily in the upper back above her bra line.  She was also having some chest discomfort on admission that radiated around to her back.  States that her back and chest pain were present for a few weeks.  She was using tramadol and Tylenol at home.  She reports a poor appetite and a weight loss of approximately 20 pounds.  She had some recent nausea and vomiting as well.  Reports that she noticed her breast mass at least a few months ago.  She initially thought this was scar tissue due to her prior cardiac surgery.  She states this area has been growing over the past few months.  Reports that she has not had mammograms for many years.   Denies headaches and dizziness.  She is not currently having any shortness of breath.  Denies abdominal pain, constipation, diarrhea.  Denies lower extremity edema.  Denies bleeding such as epistaxis, hemoptysis, hematemesis, hematuria, melena, hematochezia.  The patient is divorced and has 1 daughter who she lives with.  Denies history of alcohol use.  Smoked a minimal amount for about 4 years but quit in 1982.  Reports a family history of breast cancer in her mother who was diagnosed in her 17s.  Reports that her sister and niece have had benign breast lesions and have had lumpectomies because of this.  Reports a maternal uncle with gastric cancer.  Medical oncology was asked see the patient for recommendations regarding her left breast mass.    PET scan 12/04/2019  IMPRESSION: 1. Widespread intensely hypermetabolic osseous metastasis. 2. Left breast lesion compatible with primary breast carcinoma. 3. No findings of FDG avid lymph node metastasis or solid organ metastasis. 4. Incidental note of marked pelvic floor laxity with severe bladder prolapse measuring approximately 9 cm. 5.  Aortic Atherosclerosis (ICD10-I70.0).   SAFETY ISSUES:  Prior radiation?no  Pacemaker/ICD? Yes with defibrillator  Possible current pregnancy?Postmenopausal  Is the patient on methotrexate? No  Current Complaints / other details:  Has ongoing severe pain in mid back     BP 130/88 (BP Location: Left Arm, Patient Position: Sitting, Cuff Size: Normal)   Pulse (!) 105   Temp (!) 97.3 F (36.3 C)   Resp 20   Ht  5\' 4"  (1.626 m)   Wt 182 lb 12.8 oz (82.9 kg)   SpO2 95%   BMI 31.38 kg/m  Wt Readings from Last 3 Encounters:  12/28/19 182 lb 12.8 oz (82.9 kg)  12/26/19 179 lb (81.2 kg)  12/19/19 185 lb 9.6 oz (84.2 kg)   De Burrs, RN 12/26/2019,5:31 PM

## 2019-12-26 NOTE — Patient Instructions (Signed)
Fulvestrant injection What is this medicine? FULVESTRANT (ful VES trant) blocks the effects of estrogen. It is used to treat breast cancer. This medicine may be used for other purposes; ask your health care provider or pharmacist if you have questions. COMMON BRAND NAME(S): FASLODEX What should I tell my health care provider before I take this medicine? They need to know if you have any of these conditions:  bleeding disorders  liver disease  low blood counts, like low white cell, platelet, or red cell counts  an unusual or allergic reaction to fulvestrant, other medicines, foods, dyes, or preservatives  pregnant or trying to get pregnant  breast-feeding How should I use this medicine? This medicine is for injection into a muscle. It is usually given by a health care professional in a hospital or clinic setting. Talk to your pediatrician regarding the use of this medicine in children. Special care may be needed. Overdosage: If you think you have taken too much of this medicine contact a poison control center or emergency room at once. NOTE: This medicine is only for you. Do not share this medicine with others. What if I miss a dose? It is important not to miss your dose. Call your doctor or health care professional if you are unable to keep an appointment. What may interact with this medicine?  medicines that treat or prevent blood clots like warfarin, enoxaparin, dalteparin, apixaban, dabigatran, and rivaroxaban This list may not describe all possible interactions. Give your health care provider a list of all the medicines, herbs, non-prescription drugs, or dietary supplements you use. Also tell them if you smoke, drink alcohol, or use illegal drugs. Some items may interact with your medicine. What should I watch for while using this medicine? Your condition will be monitored carefully while you are receiving this medicine. You will need important blood work done while you are taking  this medicine. Do not become pregnant while taking this medicine or for at least 1 year after stopping it. Women of child-bearing potential will need to have a negative pregnancy test before starting this medicine. Women should inform their doctor if they wish to become pregnant or think they might be pregnant. There is a potential for serious side effects to an unborn child. Men should inform their doctors if they wish to father a child. This medicine may lower sperm counts. Talk to your health care professional or pharmacist for more information. Do not breast-feed an infant while taking this medicine or for 1 year after the last dose. What side effects may I notice from receiving this medicine? Side effects that you should report to your doctor or health care professional as soon as possible:  allergic reactions like skin rash, itching or hives, swelling of the face, lips, or tongue  feeling faint or lightheaded, falls  pain, tingling, numbness, or weakness in the legs  signs and symptoms of infection like fever or chills; cough; flu-like symptoms; sore throat  vaginal bleeding Side effects that usually do not require medical attention (report to your doctor or health care professional if they continue or are bothersome):  aches, pains  constipation  diarrhea  headache  hot flashes  nausea, vomiting  pain at site where injected  stomach pain This list may not describe all possible side effects. Call your doctor for medical advice about side effects. You may report side effects to FDA at 1-800-FDA-1088. Where should I keep my medicine? This drug is given in a hospital or clinic and will   not be stored at home. NOTE: This sheet is a summary. It may not cover all possible information. If you have questions about this medicine, talk to your doctor, pharmacist, or health care provider.  2020 Elsevier/Gold Standard (2017-10-29 11:34:41)  

## 2019-12-27 ENCOUNTER — Encounter: Payer: Self-pay | Admitting: Pharmacist

## 2019-12-27 LAB — CANCER ANTIGEN 27.29: CA 27.29: 1231.1 U/mL — ABNORMAL HIGH (ref 0.0–38.6)

## 2019-12-27 NOTE — Telephone Encounter (Signed)
Error

## 2019-12-28 ENCOUNTER — Ambulatory Visit
Admission: RE | Admit: 2019-12-28 | Discharge: 2019-12-28 | Disposition: A | Discharge status: Home / Self Care | Payer: PPO | Source: Ambulatory Visit | Attending: Radiation Oncology | Admitting: Radiation Oncology

## 2019-12-28 ENCOUNTER — Telehealth: Payer: Self-pay | Admitting: Pharmacy Technician

## 2019-12-28 ENCOUNTER — Telehealth: Payer: Self-pay

## 2019-12-28 ENCOUNTER — Encounter: Payer: Self-pay | Admitting: Radiation Oncology

## 2019-12-28 ENCOUNTER — Telehealth: Payer: Self-pay | Admitting: General Practice

## 2019-12-28 ENCOUNTER — Other Ambulatory Visit: Payer: Self-pay

## 2019-12-28 VITALS — BP 130/88 | HR 105 | Temp 97.3°F | Resp 20 | Ht 64.0 in | Wt 182.8 lb

## 2019-12-28 DIAGNOSIS — Z7901 Long term (current) use of anticoagulants: Secondary | ICD-10-CM | POA: Diagnosis not present

## 2019-12-28 DIAGNOSIS — C50911 Malignant neoplasm of unspecified site of right female breast: Secondary | ICD-10-CM

## 2019-12-28 DIAGNOSIS — I700 Atherosclerosis of aorta: Secondary | ICD-10-CM | POA: Insufficient documentation

## 2019-12-28 DIAGNOSIS — I2510 Atherosclerotic heart disease of native coronary artery without angina pectoris: Secondary | ICD-10-CM | POA: Diagnosis not present

## 2019-12-28 DIAGNOSIS — I422 Other hypertrophic cardiomyopathy: Secondary | ICD-10-CM | POA: Insufficient documentation

## 2019-12-28 DIAGNOSIS — I130 Hypertensive heart and chronic kidney disease with heart failure and stage 1 through stage 4 chronic kidney disease, or unspecified chronic kidney disease: Secondary | ICD-10-CM | POA: Insufficient documentation

## 2019-12-28 DIAGNOSIS — G893 Neoplasm related pain (acute) (chronic): Secondary | ICD-10-CM | POA: Diagnosis not present

## 2019-12-28 DIAGNOSIS — J90 Pleural effusion, not elsewhere classified: Secondary | ICD-10-CM | POA: Diagnosis not present

## 2019-12-28 DIAGNOSIS — Z803 Family history of malignant neoplasm of breast: Secondary | ICD-10-CM | POA: Diagnosis not present

## 2019-12-28 DIAGNOSIS — C7951 Secondary malignant neoplasm of bone: Secondary | ICD-10-CM | POA: Insufficient documentation

## 2019-12-28 DIAGNOSIS — I5042 Chronic combined systolic (congestive) and diastolic (congestive) heart failure: Secondary | ICD-10-CM | POA: Diagnosis not present

## 2019-12-28 DIAGNOSIS — I252 Old myocardial infarction: Secondary | ICD-10-CM | POA: Diagnosis not present

## 2019-12-28 DIAGNOSIS — C50912 Malignant neoplasm of unspecified site of left female breast: Secondary | ICD-10-CM | POA: Diagnosis not present

## 2019-12-28 DIAGNOSIS — I482 Chronic atrial fibrillation, unspecified: Secondary | ICD-10-CM | POA: Insufficient documentation

## 2019-12-28 DIAGNOSIS — Z170 Estrogen receptor positive status [ER+]: Secondary | ICD-10-CM | POA: Diagnosis not present

## 2019-12-28 DIAGNOSIS — Z79899 Other long term (current) drug therapy: Secondary | ICD-10-CM | POA: Insufficient documentation

## 2019-12-28 DIAGNOSIS — I480 Paroxysmal atrial fibrillation: Secondary | ICD-10-CM | POA: Diagnosis not present

## 2019-12-28 DIAGNOSIS — C50919 Malignant neoplasm of unspecified site of unspecified female breast: Secondary | ICD-10-CM

## 2019-12-28 DIAGNOSIS — E1122 Type 2 diabetes mellitus with diabetic chronic kidney disease: Secondary | ICD-10-CM | POA: Diagnosis not present

## 2019-12-28 DIAGNOSIS — Z87891 Personal history of nicotine dependence: Secondary | ICD-10-CM | POA: Diagnosis not present

## 2019-12-28 DIAGNOSIS — I509 Heart failure, unspecified: Secondary | ICD-10-CM | POA: Diagnosis not present

## 2019-12-28 MED ORDER — PALBOCICLIB 100 MG PO TABS: 100.0000 mg | ORAL_TABLET | Freq: Every day | ORAL | 6 refills | Status: AC

## 2019-12-28 NOTE — Progress Notes (Signed)
See progress note under physician encounter. 

## 2019-12-28 NOTE — Telephone Encounter (Signed)
[facility] CSW Progress Notes  Call from [place], patient needs ride to appt tomorrow at 1 PM.  Uses wheelchair transport.  Called Transportation , they have scheduled a ride for tomorrow's appt.  Will pick her up at 12:30.  Called patient and informed her of this.  Patient requests call from provider re refills of pain medication - L Strandberg informed.  Edwyna Shell, LCSW Clinical Social Worker Phone:  [phone]

## 2019-12-28 NOTE — Addendum Note (Signed)
Addended by: Burney Gauze R on: 12/28/2019 11:53 AM   Modules accepted: Orders

## 2019-12-28 NOTE — Progress Notes (Addendum)
Initial Outpatient Consultation  Name: [name] MRN: [id number]  Date: 12/28/2019  DOB: 04-Aug-1946  HX:TAVW, Arbie Cookey, MD  Volanda Napoleon, MD   REFERRING PHYSICIAN: Volanda Napoleon, MD  DIAGNOSIS: The encounter diagnosis was Carcinoma of breast metastatic to bone, unspecified laterality ([place]).  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 female who is accompanied by no one due to COVID-19 restrictions. The patient presented to the ED on 12/04/2019 with complaints of one-month history of upper back pain, chest pain, and cough with mild dyspnea. Chest x-ray at that time showed pulmonary interstitial edema. CTA of chest showed a questionable 4.1 x 2.4 cm left-sided breast mass. It also showed acute appearing compression fractures of the T4 and T5 vertebral bodies, a questionable fracture of the T3 vertebral body, and acute fracture of the T3 vertebral body, and a questionable fracture through the superior endplate of the C7 vertebral body. Finally, it showed cardiomegaly with small right-sided pleural effusion and mild bilateral interlobular septal thickening that was suggestive of interstitial edema, reflux of contrast in the IVC consistent with underlying cardiac dysfunction, extensive collateral veins throughout the chest wall that were likely secondary to occulusion of the left brachiocephalic and subclavian vein secondary to the presence of a left-sided pacemaker, and possible cholelithiasis. There was no evidence of thoracic aortic dissection. The patient was admitted into the hospital for further workup and management of upper back pain, atrial fibrillation with RVR, and acute on chronic CHF.  While in the hospital, the patient was seen in consultation with Dr. Marin Olp on 12/05/2019. At that time, the patient noted that she had noticed the left breast mass  a few months ago and initially believed that it was scar tissue from her prior cardiac surgery. However, the area continued to grow. Her cancer antigen 27.29 was 704. Dr. Marin Olp recommended additional work-up for left breast mass including mammogram, ultrasound, and biopsy in addition to a bone scan and CT of abdomen/pelvis to evaluate for other sites of biopsy.  Bone scan on 12/06/2019 showed punctate areas of increased activity over the right frontal skull, mid right anterior rib, and possibly mid left anterior rib. There were also subtle punctate areas of increased activity over the lower cervical/upper thoracic spine that could not be excluded. Additionally, there was a punctate area of increased activity noted over the upper lumbar spine. The possibility of metastatic disease presenting in that fashion could not be excluded. Finally, it showed increased activity over the medial right clavicle and manubrium in addition to a punctate area of increased activity over the medial right knee joint space; changes may be degenerative.   Echocardiogram performed on 12/06/2019 showed an EF of 35-40%.  Ultrasound-guided core biopsy of the left breast performed on 12/07/2019 by Dr. Vernard Gambles showed grade 3 invasive ductal carcinoma with ductal carcinoma in-situ. Prognostic indictors were significant for Estrogen Receptor 50% positive with strong staining intensity and Progesterone Receptor 10% positive with moderate staining intensity. Proliferation Marker Ki-67 was 10%. Her2 positive.  The patient underwent a transesophageal echocardiogram (TEE) cardioversion as surgical intervention for atrial fibrillation on 12/09/2019 performed by Dr. Haroldine Laws.The patient tolerated it relatively well without any apparent complications. Following this, she was discharged from the hospital on 12/10/2019.  PET scan on 12/22/2019 showed widespread intensely hypermetabolic osseous metastasis. There was also a left breast lesion  compatible with primary  breast carcinoma. There were no findings of FDG avid lymph node metastasis or solid organ metastasis.  The patient was last seen by Dr. Marin Olp on 12/26/2019. Her cancer antigen 27.29 at that time had increased to 1,231. She began therapy with Faslodex and Xgeva. She is to begin Ribociclib on 01/03/2020. Dr. Marin Olp would like to try the patient on Herceptin, however her cardiac function is not well enough at this time.  Radiation therapy has been consulted for consideration of palliative treatment given her painful osseous metastasis  PREVIOUS RADIATION THERAPY: No  PAST MEDICAL HISTORY:  Past Medical History:  Diagnosis Date  . Abnormal PFT   . Breast cancer metastasized to bone, unspecified laterality ([place]) 12/06/2019  . CAD (coronary artery disease), native coronary artery - 3 vessel 04/21/2015   a. NSTEMI 8-04/2015 felt 2/2 demand ischemia. b. 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  . Chronic diastolic CHF (congestive heart failure) ([place])   . Diabetes mellitus ([place])   . Goals of care, counseling/discussion 12/06/2019  . Hypertrophic cardiomyopathy ([place])   . Left atrial enlargement   . Mitral regurgitation    a. Echo 04/2015: moderate mitral regurgitation. b. F/u echo 05/2015: mild MR.  . NSTEMI (non-ST elevated myocardial infarction) ([place]) 11/27/2016  . Paroxysmal atrial fibrillation ([name])   . Tricuspid regurgitation    a. Echo 04/2015: Mod-severe TR. b. Not mentioned on echo 05/2015  . Ventricular tachycardia ([place])    a. s/p St. Jude ICD implanted 04/2015.    PAST SURGICAL HISTORY: Past Surgical History:  Procedure Laterality Date  . APPLICATION OF WOUND VAC Left 01/06/2013   Procedure: APPLICATION OF WOUND VAC X 2;  Surgeon: [name], MD;  Location: [facility];  Service: Orthopedics;  Laterality: Left;  left forearm  . APPLICATION OF WOUND VAC Left 01/09/2013   Procedure: APPLICATION OF WOUND VAC;   Surgeon: [name], MD;  Location: [facility];  Service: Orthopedics;  Laterality: Left;  . BREAST BIOPSY Left 12/07/2019    US guided L breast mass core biopsy    . CARDIAC CATHETERIZATION N/A 04/19/2015   Procedure: Left Heart Cath and Coronary Angiography;  Surgeon: [name], MD;  Location: [facility];  Service: Cardiovascular;  Laterality: N/A;  . CARDIOVERSION N/A 09/20/2015   Procedure: CARDIOVERSION;  Surgeon: Pixie Casino, MD;  Location: [facility] END[facility]COPY;  Service: Cardiovascular;  Laterality: N/A;  . CARDIOVERSION N/A 12/09/2019   Procedure: CARDIOVERSION;  Surgeon: Jolaine Artist, MD;  Location: [facility] ENDOSCOPY;  Service: Cardiovascular;  Laterality: N/A;  . CLIPPING OF ATRIAL APPENDAGE Left 12/04/2016   Procedure: CLIPPING OF ATRIAL APPENDAGE USING ATRICURE 31 ATRICLIP;  Surgeon: Melrose Nakayama, MD;  Location: [place];  Service: Open Heart Surgery;  Laterality: Left;  . CORONARY ARTERY BYPASS GRAFT N/A 12/04/2016   Procedure: CORONARY ARTERY BYPASS GRAFTING (CABG)x 4 WITH ENDOSCOPIC HARVESTING OF RIGHT SAPHENOUS VEIN;  Surgeon: Melrose Nakayama, MD;  Location: [place];  Service: Open Heart Surgery;  Laterality: N/A;  . DENTAL SURGERY  Oct. 2011   several extractions, bone graft  . EP IMPLANTABLE DEVICE N/A 04/16/2015   Procedure: ICD Implant;  Surgeon: [name], MD;  Location: [place] [facility];  Service: Cardiovascular;  Laterality: N/A;  . I & D EXTREMITY Left 01/06/2013   Procedure: IRRIGATION AND DEBRIDEMENT LEFT ELBOW AND LEFT FOREARM ;  Surgeon: [name], MD;  Location: [facility];  Service: Orthopedics;  Laterality: Left;  . INCISION AND DRAINAGE Left 01/09/2013   Procedure: REDO INCISION AND DRAINAGE LEFT ELBOW;  Surgeon: [name], MD;  Location: [facility];  Service: Orthopedics;  Laterality: Left;  SUPINE, UPPER EXTERMITY DRAPE  . INTRAOPERATIVE TRANSESOPHAGEAL ECHOCARDIOGRAM N/A 12/04/2016   Procedure: INTRAOPERATIVE TRANSESOPHAGEAL ECHOCARDIOGRAM;   Surgeon: Melrose Nakayama, MD;  Location: [place];  Service: Open Heart Surgery;  Laterality: N/A;  . IR THORACENTESIS ASP PLEURAL SPACE W/IMG GUIDE  12/22/2016  . IR US GUIDE BX ASP/DRAIN  12/07/2019  . LEFT HEART CATH AND CORONARY ANGIOGRAPHY N/A 11/28/2016   Procedure: Left Heart Cath and Coronary Angiography;  Surgeon: Belva Crome, MD;  Location: [place] [facility];  Service: Cardiovascular;  Laterality: N/A;  . MAZE N/A 12/04/2016   Procedure: MAZE PROCEDURE;  Surgeon: Melrose Nakayama, MD;  Location: [place];  Service: Open Heart Surgery;  Laterality: N/A;  . RIGHT HEART CATH N/A 01/02/2017   Procedure: Right Heart Cath;  Surgeon: Jolaine Artist, MD;  Location: [place] [facility];  Service: Cardiovascular;  Laterality: N/A;  . SECONDARY CLOSURE OF WOUND Left 01/09/2013   Procedure: SECONDARY CLOSURE OF WOUND  LEFT ELBOW;  Surgeon: [name], MD;  [facility];  Service: Orthopedics;  Laterality: Left;  . TEE WITHOUT CARDIOVERSION N/A 09/20/2015   Procedure: TRANSESOPHAGEAL ECHOCARDIOGRAM (TEE);  Surgeon: Pixie Casino, MD;  Location: [place];  Service: Cardiovascular;  Laterality: N/A;  . TEE WITHOUT CARDIOVERSION N/A 12/09/2019   Procedure: TRANSESOPHAGEAL ECHOCARDIOGRAM (TEE);  Surgeon: Jolaine Artist, MD;  Location: [facility] ENDOSCOPY;  Service: Cardiovascular;  Laterality: N/A;    FAMILY HISTORY:  Family History  Problem Relation Age of Onset  . Breast cancer Mother   . Diabetes Father   . Stroke Father   . Heart disease Sister   . Healthy Sister   . Gastric cancer Maternal Uncle     SOCIAL HISTORY:  Social History   Tobacco Use  . Smoking status: Former Smoker    Years: 4.00    Quit date: 12/31/1980    Years since quitting: 39.0  . Smokeless tobacco: Never Used  Substance Use Topics  . Alcohol use: No  . Drug use: No    ALLERGIES:  Allergies  Allergen Reactions  . Levaquin [Levofloxacin] Other (See Comments)    Suicidal thoughts, extreme anxiety,  panic attack  . Tramadol     Fuzzy thinking and agitation per patient  . Phenergan [Promethazine Hcl] Other (See Comments)    Restless    MEDICATIONS:  Current Outpatient Medications  Medication Sig Dispense Refill  . acetaminophen (TYLENOL) 500 MG tablet Take 500-1,000 mg by mouth every 6 (six) hours as needed for headache (pain).    . Blood Glucose Monitoring Suppl (TRUE METRIX METER) DEVI 1 each by Does not apply route 2 (two) times daily. 1 Device 0  . ELIQUIS 5 MG TABS tablet TAKE ONE TABLET BY MOUTH TWICE A DAY (Patient taking differently: Take 5 mg by mouth 2 (two) times daily. ) 180 tablet 1  . furosemide (LASIX) 80 MG tablet Take 1 tablet (80 mg total) by mouth daily. 30 tablet 5  . glipiZIDE (GLUCOTROL) 5 MG tablet Take 1 tablet (5 mg total) by mouth daily. 90 tablet 1  . glucose blood (TRUE METRIX BLOOD GLUCOSE TEST) test strip Use as instructed twice daily 100 each 12  . Magnesium Hydroxide (MILK OF MAGNESIA CONCENTRATE PO) Take by mouth every 8 (eight) hours as needed.    [name]  ondansetron (ZOFRAN-ODT) 8 MG disintegrating tablet Take 1 tablet (8 mg total) by mouth every 8 (eight) hours as needed for nausea or vomiting. 60 tablet 0  . oxyCODONE (OXY IR/ROXICODONE) 5 MG immediate release tablet Take 1 tablet (5 mg total) by mouth every 6 (six) hours as needed for moderate pain or severe pain. 90 tablet 0  . oxyCODONE (OXYCONTIN) 20 mg 12 hr tablet Take 1 tablet (20 mg total) by mouth every 12 (twelve) hours. 60 tablet 0  . palbociclib (IBRANCE) 100 MG tablet Take 1 tablet (100 mg total) by mouth daily. Take for 21 days on, 7 days off, repeat every 28 days. 21 tablet 6  . TRUEPLUS LANCETS 28G MISC Use as instructed twice daily. 60 each 5   No current facility-administered medications for this encounter.    REVIEW OF SYSTEMS:  A 10+ POINT REVIEW OF SYSTEMS WAS OBTAINED including neurology, dermatology, psychiatry, cardiac, respiratory, lymph, extremities, GI, GU, musculoskeletal,  constitutional, reproductive, HEENT.  Patient complains of pain most significantly in the upper thoracic spine area.  She also complains of some pain along the medial border of her scapula right scapula.  Surprisingly she denies any pain in the lumbosacral area where extensive disease is noted.  She denies any numbness in her lower extremities or weakness.  She denies any headaches.  She reports some occasional difficulties with swallowing.   PHYSICAL EXAM:  height is _0  (1.626 m) and weight is 182 lb 12.8 oz (82.9 kg). Her temperature is 97.3 F (36.3 C) (abnormal). Her blood pressure is 130/88 and her pulse is 105 (abnormal). Her respiration is 20 and oxygen saturation is 95%.   General: Alert and oriented, in no acute distress, she remains in a wheelchair throughout her evaluation HEENT: Head is normocephalic. Extraocular movements are intact.  Neck: Neck is supple, no palpable cervical or supraclavicular lymphadenopathy. Heart: Regular in rate and rhythm with no murmurs, rubs, or gallops. Chest: Clear to auscultation bilaterally, with no rhonchi, wheezes, or rales.  Cardio defibrillator present in the left upper chest Abdomen: Soft, nontender, nondistended, with no rigidity or guarding. Extremities: No cyanosis or edema. Lymphatics: see Neck Exam Skin: No concerning lesions. Musculoskeletal: symmetric strength and muscle tone throughout. Neurologic: Cranial nerves II through XII are grossly intact. No obvious focalities. Speech is fluent. Coordination is intact. Psychiatric: Judgment and insight are intact. Affect is appropriate.   ECOG = 2  0 - Asymptomatic (Fully active, able to carry on all predisease activities without restriction)  1 - Symptomatic but completely ambulatory (Restricted in physically strenuous activity but ambulatory and able to carry out work of a light or sedentary nature. For example, light housework, office work)  2 - Symptomatic, <50% in bed during the day  (Ambulatory and capable of all self care but unable to carry out any work activities. Up and about more than 50% of waking hours)  3 - Symptomatic, >50% in bed, but not bedbound (Capable of only limited self-care, confined to bed or chair 50% or more of waking hours)  4 - Bedbound (Completely disabled. Cannot carry on any self-care. Totally confined to bed or chair)  5 - Death   Eustace Pen MM, Creech RH, Tormey DC, et al. [phone]). "Toxicity and response criteria of the [facility]". [place] Oncol. 5 (6): 649-55  LABORATORY DATA:  Lab Results  Component Value Date   WBC 7.1 12/26/2019   HGB 10.8 (L) 12/26/2019   HCT 36.6 12/26/2019   MCV  88.0 12/26/2019   PLT 289 12/26/2019   NEUTROABS 5.1 12/26/2019   Lab Results  Component Value Date   NA 141 12/26/2019   K 4.8 12/26/2019   CL 104 12/26/2019   CO2 27 12/26/2019   GLUCOSE 170 (H) 12/26/2019   CREATININE 1.79 (H) 12/26/2019   CALCIUM 9.7 12/26/2019      RADIOGRAPHY: NM Bone Scan Whole Body  Result Date: 12/06/2019 CLINICAL DATA:  T-spine compression fracture. EXAM: NUCLEAR MEDICINE WHOLE BODY BONE SCAN TECHNIQUE: Whole body anterior and posterior images were obtained approximately 3 hours after intravenous injection of radiopharmaceutical. RADIOPHARMACEUTICALS:  21.4 mCi Technetium-40mMDP IV COMPARISON:  CT 12/04/2019. FINDINGS: Bilateral renal function excretion. Punctate focus of increased activity noted over the right frontal skull. Punctate area of increased activity noted over a mid right anterior mid rib and possibly mid left anterior rib. Subtle punctate areas of increased activity over the lower cervical/upper thoracic cannot be excluded. Punctate area of increased activity noted over the upper lumbar spine. The possibility of metastatic disease presenting in this fashion cannot be excluded. Increased activity noted over the medial right clavicle and manubrium. This may be degenerative. Mild increased  activity noted about the medial right knee joint space most likely degenerative. IMPRESSION: 1. Punctate areas of increased activity noted over the right frontal skull, mid right anterior rib and possibly mid left anterior rib. Subtle punctate areas of increased activity over the lower cervical/upper thoracic spine cannot be excluded. Punctate area of increased activity noted over the upper lumbar spine. The possibility metastatic disease presenting in this fashion cannot be excluded. 2. Increased activity noted over the medial right clavicle and manubrium. These changes may be degenerative. Punctate area of increased activity noted over the medial right knee joint space most likely degenerative. Plain film images of the above described areas may prove useful for further evaluation. Electronically Signed   By: TMarcello Moores Register   On: 12/06/2019 14:06   NM PET Image Initial (PI) Skull Base To Thigh  Result Date: 12/22/2019 CLINICAL DATA:  Initial treatment strategy for metastatic breast cancer. EXAM: NUCLEAR MEDICINE PET SKULL BASE TO THIGH TECHNIQUE: 9.12 mCi F-18 FDG was injected intravenously. Full-ring PET imaging was performed from the skull base to thigh after the radiotracer. CT data was obtained and used for attenuation correction and anatomic localization. Fasting blood glucose:  97 mg/dl COMPARISON:  From 12/06/2019 FINDINGS: Mediastinal blood pool activity: SUV max 1.95 Liver activity: SUV max NA NECK: No FDG avid lymph nodes identified within the soft tissues of the neck. There is diffuse tracer uptake identified within both lobes of thyroid gland with SUV max of 7.6. Incidental CT findings: none CHEST: Measures approximately 3.2 cm within SUV max of 13.5. There is a subcentimeter left axillary lymph node which measures which measures 6 mm within SUV max of 2.58. No FDG avid supraclavicular, right axillary, mediastinal, or hilar lymph nodes. Scar like densities noted within both upper lobes. Bilateral  mosaic attenuation pattern is identified, favor sequelae of small airways disease. Incidental CT findings: Heart size is enlarged. Left chest wall ICD noted. Aortic atherosclerosis. Previous CABG procedure. ABDOMEN/PELVIS: No abnormal FDG uptake within the liver, pancreas, spleen, or adrenal glands. No FDG avid lymph nodes. Incidental CT findings: Marked pelvic floor laxity with bladder prolapse with extension of approximately 9 cm below the pubococcygeal line. This degree of prolapse is considered severe. SKELETON: Widespread FDG avid skeletal metastases identified. On the corresponding CT images there are no significant aggressive lytic or  sclerotic changes associated with the intensely hypermetabolic bone metastases. On the PET images lesions are confluent and too numerous to count. For reference purposes: SUV max within right sacral wing hypermetabolism is equal to 16.8. SUV max within the posterior column of the left acetabular lesion is equal to 25. SUV max within the left scapular lesion is equal to 16.0. Incidental CT findings: Compression deformities within the upper thoracic spine are better defined on diagnostic CT from 12/04/2019. IMPRESSION: 1. Widespread intensely hypermetabolic osseous metastasis. 2. Left breast lesion compatible with primary breast carcinoma. 3. No findings of FDG avid lymph node metastasis or solid organ metastasis. 4. Incidental note of marked pelvic floor laxity with severe bladder prolapse measuring approximately 9 cm. 5.  Aortic Atherosclerosis (ICD10-I70.0). Electronically Signed   By: Kerby Moors M.D.   On: 12/22/2019 15:57   IR US Guide Bx Asp/Drain  Result Date: 12/08/2019 CLINICAL DATA:  Left breast mass.  Osseous metastatic disease. EXAM: ULTRASOUND GUIDED CORE BIOPSY OF LEFT BREAST MASS MEDICATIONS: Lidocaine 1% subcutaneous PROCEDURE: The procedure, risks, benefits, and alternatives were explained to the patient. Questions regarding the procedure were encouraged  and answered. The patient understands and consents to the procedure. Hypoechoic upper left breast mass was localized corresponding to CT findings. Appropriate skin entry site was determined and marked. The operative field was prepped with Betadine in a sterile fashion, and a sterile drape was applied covering the operative field. A sterile gown and sterile gloves were used for the procedure. Local anesthesia was provided with 1% Lidocaine. Under real-time ultrasound guidance, a 17 gauge trocar needle was advanced to the margin of the lesion. Once needle tip position was confirmed, coaxial 18-gauge core biopsy samples were obtained, submitted in formalin to surgical pathology. The guide needle was removed. Postprocedure scan shows no hemorrhage or other apparent complication. The patient tolerated the procedure well. COMPLICATIONS: None. FINDINGS: Hypoechoic upper left breast mass localized. Core biopsy samples obtained as above. IMPRESSION: 1. Technically successful core biopsy, left breast mass. Electronically Signed   By: Lucrezia Europe M.D.   On: 12/08/2019 09:34   DG Chest Port 1 View  Result Date: 12/04/2019 CLINICAL DATA:  Right-sided chest pain. EXAM: PORTABLE CHEST 1 VIEW COMPARISON:  02/10/2017 FINDINGS: Stable heart size and appearance of pacing/ICD device and left atrial appendage clip. Status post prior CABG. Lungs demonstrate evidence pulmonary interstitial edema. No focal airspace consolidation, pleural fluid or pneumothorax identified. IMPRESSION: Pulmonary interstitial edema. Electronically Signed   By: Aletta Edouard M.D.   On: 12/04/2019 14:17   CT ANGIO CHEST AORTA W/CM &/OR WO/CM  Result Date: 12/04/2019 CLINICAL DATA:  Chest pain and back pain. Aortic dissection suspected. EXAM: CT ANGIOGRAPHY CHEST WITH CONTRAST TECHNIQUE: Multidetector CT imaging of the chest was performed using the standard protocol during bolus administration of intravenous contrast. Multiplanar CT image reconstructions  and MIPs were obtained to evaluate the vascular anatomy. CONTRAST:  167m OMNIPAQUE IOHEXOL 350 MG/ML SOLN COMPARISON:  None. FINDINGS: Cardiovascular: There is no evidence for a thoracic aortic dissection. There is no evidence for thoracic aortic aneurysm. Atherosclerotic changes are noted of the thoracic aorta. The main pulmonary artery is slightly dilated. There is no large centrally located pulmonary embolism. Detection of smaller pulmonary emboli is limited by technique. The heart is enlarged. There is no significant pericardial effusion. The patient is status post prior CABG. Coronary artery calcifications are noted. Extensive collateral veins are noted throughout the chest wall which are likely secondary to occlusion of the left brachiocephalic  and subclavian vein secondary to the presence of a left-sided pacemaker. There is reflux of contrast in the IVC consistent with underlying cardiac dysfunction. Mediastinum/Nodes: --mildly prominent mediastinal and hilar lymph nodes are noted. For example there is a precarinal lymph node measuring approximately 1.4 cm. These are presumably reactive. --No axillary lymphadenopathy. --No supraclavicular lymphadenopathy. --Normal thyroid gland. --The esophagus is unremarkable Lungs/Pleura: There is a small right-sided pleural effusion. There is some mild bilateral interlobular septal thickening. There is a probable trace left-sided pleural effusion. There is no pneumothorax. Upper Abdomen: There may be cholelithiasis within the partially visualized gallbladder. Musculoskeletal: There is a questionable 4.1 x 2.4 cm left-sided breast mass (axial series 5, image 44). There are acute appearing compression fractures of the T4 and T5 vertebral bodies. There is a questionable fracture of the T3 vertebral body. There is an acute fracture of the C7 transverse process. There is a questionable fracture of the superior endplate of the C7 vertebral body. Review of the MIP images  confirms the above findings. IMPRESSION: 1. There is no evidence for a thoracic aortic dissection. 2. There are acute appearing compression fractures of the T4 and T5 vertebral bodies. There is a questionable fracture of the T3 vertebral body. There is an acute fracture of the C7 transverse process. There is a questionable fracture through the superior endplate of the C7 vertebral body. 3. There is a questionable 4.1 x 2.4 cm left-sided breast mass. Correlation with physical exam and mammography is recommended. 4. Cardiomegaly with small right-sided pleural effusion and mild bilateral interlobular septal thickening, suggestive of interstitial edema. 5. Reflux of contrast in the IVC consistent with underlying cardiac dysfunction. 6. 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 7. Possible cholelithiasis within the partially visualized gallbladder. Aortic Atherosclerosis (ICD10-I70.0). Electronically Signed   By: Constance Holster M.D.   On: 12/04/2019 21:31   ECHOCARDIOGRAM COMPLETE  Result Date: 12/06/2019    ECHOCARDIOGRAM REPORT   Patient Name:   MARSHAYLA MITSCHKE Date of Exam: 12/06/2019 Medical Rec #:  [id number]        Height:       64.0 in Accession #:    [id number]       Weight:       176.1 lb Date of Birth:  07-23-1946        BSA:          1.853 m Patient Age:    74 years         BP:           100/68 mmHg Patient Gender: F                HR:           90 bpm. Exam Location:  Inpatient Procedure: 2D Echo Indications:    acute systolic chf 109.32  History:        Patient has prior history of Echocardiogram examinations, most                 recent 06/22/2019. Prior CABG and Defibrillator, chronic kidney                 disease, Arrythmias:Atrial Fibrillation, Signs/Symptoms:Chest                 Pain; Risk Factors:Hypertension, Dyslipidemia and Diabetes.   Sonographer:    Johny Chess Referring Phys: [phone]  BRITTAINY M SIMMONS IMPRESSIONS  1. Left ventricular ejection fraction, by estimation, is 35 to 40%. The left ventricle has moderately decreased function. The left ventricle demonstrates regional wall motion abnormalities (see scoring diagram/findings for description). There is severe asymmetric left ventricular hypertrophy. Left ventricular diastolic function could not be evaluated.  2. Right ventricular systolic function is low normal. The right ventricular size is normal. There is severely elevated pulmonary artery systolic pressure.  3. Left atrial size was severely dilated.  4. Right atrial size was mildly dilated.  5. The mitral valve is degenerative. Mild mitral valve regurgitation. No evidence of mitral stenosis.  6. Tricuspid valve regurgitation is moderate.  7. The aortic valve is bicuspid. Aortic valve regurgitation is not visualized. Mild aortic valve sclerosis is present, with no evidence of aortic valve stenosis.  8. The inferior vena cava is dilated in size with <50% respiratory variability, suggesting right atrial pressure of 15 mmHg. Conclusion(s)/Recommendation(s): EF has decreased since prior echo. FINDINGS  Left Ventricle: Left ventricular ejection fraction, by estimation, is 35 to 40%. The left ventricle has moderately decreased function. The left ventricle demonstrates regional wall motion abnormalities. The left ventricular internal cavity size was normal in size. There is severe asymmetric left ventricular hypertrophy. Left ventricular diastolic function could not be evaluated due to atrial fibrillation. Left ventricular diastolic function could not be evaluated. Right Ventricle: The right ventricular size is normal. No increase in right ventricular wall thickness. Right ventricular systolic function is low normal. There is severely elevated pulmonary artery systolic pressure. The tricuspid regurgitant velocity is 3.75 m/s, and with an  assumed right atrial pressure of 15 mmHg, the estimated right ventricular systolic pressure is 84.6 mmHg. Left Atrium: Left atrial size was severely dilated. Right Atrium: Right atrial size was mildly dilated. Pericardium: There is no evidence of pericardial effusion. Mitral Valve: The mitral valve is degenerative in appearance. There is moderate thickening of the mitral valve leaflet(s). There is moderate calcification of the mitral valve leaflet(s). Severe mitral annular calcification. Mild mitral valve regurgitation. No evidence of mitral valve stenosis. Tricuspid Valve: The tricuspid valve is normal in structure. Tricuspid valve regurgitation is moderate . No evidence of tricuspid stenosis. Aortic Valve: The aortic valve is bicuspid. . There is mild thickening and moderate calcification of the aortic valve. Aortic valve regurgitation is not visualized. Mild aortic valve sclerosis is present, with no evidence of aortic valve stenosis. There is mild thickening of the aortic valve. There is moderate calcification of the aortic valve. Pulmonic Valve: The pulmonic valve was grossly normal. Pulmonic valve regurgitation is mild. No evidence of pulmonic stenosis. Aorta: The aortic root, ascending aorta and aortic arch are all structurally normal, with no evidence of dilitation or obstruction. Venous: The inferior vena cava is dilated in size with less than 50% respiratory variability, suggesting right atrial pressure of 15 mmHg. IAS/Shunts: No atrial level shunt detected by color flow Doppler. Additional Comments: A pacer wire is visualized in the right atrium and right ventricle.  LEFT VENTRICLE PLAX 2D LVIDd:         4.80 cm LVIDs:         3.90 cm LV PW:         1.10 cm LV IVS:        1.85 cm LVOT diam:     1.90 cm LV SV:         40 LV SV Index:   22 LVOT Area:     2.84 cm  LEFT ATRIUM  Index       RIGHT ATRIUM           Index LA diam:        4.50 cm  2.43 cm/m  RA Area:     19.10 cm LA Vol (A2C):    96.2 ml  51.91 ml/m RA Volume:   49.50 ml  26.71 ml/m LA Vol (A4C):   94.1 ml  50.77 ml/m LA Biplane Vol: 101.0 ml 54.50 ml/m  AORTIC VALVE LVOT Vmax:   83.50 cm/s LVOT Vmean:  51.200 cm/s LVOT VTI:    0.141 m TRICUSPID VALVE TR Peak grad:   56.2 mmHg TR Vmax:        375.00 cm/s  SHUNTS Systemic VTI:  0.14 m Systemic Diam: 1.90 cm Buford Dresser MD Electronically signed by Buford Dresser MD Signature Date/Time: 12/06/2019/4:48:28 PM    Final       IMPRESSION: 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  This is likely resulted in the pathologic fractures would worsen her pain in addition.  Patient will be a good candidate for palliative radiation therapy directed at this area.  I discussed the general course of treatment side effects and potential toxicities of radiation therapy in this situation with the patient.  She appears to understand and wishes to proceed with planned course of treatment.   Given the close proximity of the patient's cardio defibrillator to her anticipated radiation fields, we may need angle the radiation beam  to avoid entrance and exit dose to this structure.    PLAN: Patient will return tomorrow for CT simulation with treatments to begin June 1.  Anticipate between 10 and 14 radiation treatments depending on the overall length of the radiation field.    ------------------------------------------------  Blair Promise, PhD, MD  This document serves as a record of services personally performed by Gery Pray, MD. It was created on his behalf by Clerance Lav, a trained medical scribe. The creation of this record is based on the scribe's personal observations and the provider's statements to them. This document has been checked and approved by the attending provider.

## 2019-12-28 NOTE — Telephone Encounter (Signed)
Oral Oncology Patient Advocate Encounter   Received notification from [employer] that prior authorization for [name] is required.   PA submitted on CoverMyMeds Key [id number] Status is pending   Oral Oncology Clinic will continue to follow.  [place] Patient [place] Phone [phone] Fax [phone] 12/29/2019 11:31 AM

## 2019-12-29 ENCOUNTER — Ambulatory Visit
Admission: RE | Admit: 2019-12-29 | Discharge: 2019-12-29 | Disposition: A | Discharge status: Home / Self Care | Payer: PPO | Source: Ambulatory Visit | Attending: Radiation Oncology | Admitting: Radiation Oncology

## 2019-12-29 ENCOUNTER — Telehealth (HOSPITAL_COMMUNITY): Payer: Self-pay | Admitting: *Deleted

## 2019-12-29 DIAGNOSIS — C7951 Secondary malignant neoplasm of bone: Secondary | ICD-10-CM | POA: Insufficient documentation

## 2019-12-29 DIAGNOSIS — C50912 Malignant neoplasm of unspecified site of left female breast: Secondary | ICD-10-CM | POA: Diagnosis not present

## 2019-12-29 MED ORDER — HYDROMORPHONE HCL 1 MG/ML IJ SOLN
0.5000 mg | Freq: Once | INTRAMUSCULAR | Status: DC
  Filled 2019-12-29: qty 1

## 2019-12-29 NOTE — Telephone Encounter (Signed)
Oral Oncology Patient Advocate Encounter  Received notification from [employer] that the request for prior authorization for [name] has been denied due to patient not being HER-2 negative .     This encounter will continue to be updated until final determination.    [place] Patient [place] Phone [phone] Fax [phone] 12/29/2019 2:38 PM

## 2019-12-29 NOTE — Telephone Encounter (Signed)
Pt left VM stating the swelling in her legs has not improved since increasing her diuretic on 5/17. Pt is very concerned and would like some advice from a provider.  Routed to [employer], [place] for advice

## 2019-12-30 ENCOUNTER — Encounter: Payer: Self-pay | Admitting: General Practice

## 2019-12-30 NOTE — Progress Notes (Signed)
Patient given .5 mg Dilaudid IM per verbal order of Dr. Sondra Come  at pt's right deltoid due to her pain and inability to get on the radiation table for her tx. Patient alert and oriented and states her pain is severe in her lower back. [id number] Spoke with RT in radiation who reports patient was able to have her tx after her med was given and her pain had lessened. Patient had been d/c'd from Radiation via w/c by the RT.

## 2019-12-30 NOTE — Progress Notes (Signed)
[place] Psychosocial Distress Screening Clinical Social Work  Clinical Social Work was referred by distress screening protocol.  The patient scored a 10 on the Psychosocial Distress Thermometer which indicates severe distress. Clinical Social Worker contacted patient by phone to assess for distress and other psychosocial needs.  Unable to complete screen - patient became nauseous and asked to hang up.  Support Team will recontact patient at a later date.    ONCBCN DISTRESS SCREENING 12/28/2019  Screening Type Initial Screening  Distress experienced in past week (1-10) 10  Family Problem type Children  Information Concerns Type Lack of info about diagnosis  Referral to clinical social work Yes    Clinical Social Worker follow up needed: Yes.   Support Team to recontact patient  If yes, follow up plan:  Beverely Pace, [place], LCSW Clinical Social Worker Phone:  [phone]

## 2020-01-03 ENCOUNTER — Other Ambulatory Visit: Payer: Self-pay

## 2020-01-03 ENCOUNTER — Telehealth: Payer: Self-pay

## 2020-01-03 ENCOUNTER — Ambulatory Visit
Admission: RE | Admit: 2020-01-03 | Discharge: 2020-01-03 | Disposition: A | Discharge status: Home / Self Care | Payer: PPO | Source: Ambulatory Visit | Attending: Radiation Oncology | Admitting: Radiation Oncology

## 2020-01-03 ENCOUNTER — Telehealth: Payer: Self-pay | Admitting: Pharmacist

## 2020-01-03 DIAGNOSIS — C50919 Malignant neoplasm of unspecified site of unspecified female breast: Secondary | ICD-10-CM | POA: Insufficient documentation

## 2020-01-03 DIAGNOSIS — C50912 Malignant neoplasm of unspecified site of left female breast: Secondary | ICD-10-CM | POA: Diagnosis not present

## 2020-01-03 DIAGNOSIS — C7951 Secondary malignant neoplasm of bone: Secondary | ICD-10-CM | POA: Insufficient documentation

## 2020-01-03 MED FILL — IBRANCE 100 MG TABS: 100 | 21 days supply | Qty: 21 | Fill #0

## 2020-01-03 NOTE — Telephone Encounter (Signed)
Oral Oncology Pharmacist Encounter  Received new prescription for Ibrance (palbociclib) for the treatment of metastatic breast cancer in conjunction with fulvestrant, planned duration until disease progression or unacceptable drug toxicity.  CMP/CBC from 12/26/19 assessed, no relevant lab abnormalities. Prescription dose and frequency assessed.   Current medication list in Epic reviewed, no DDIs with palbociclib identified.  Prescription has been e-scribed to the [facility] for benefits analysis and approval.  Oral Oncology Clinic will continue to follow for insurance authorization, copayment issues, initial counseling and start date.  Darl Pikes, PharmD, BCPS, BCOP, CPP Hematology/Oncology Clinical Pharmacist Practitioner [facility]/HP/AP [facility] [phone]  01/03/2020 9:19 AM

## 2020-01-03 NOTE — Telephone Encounter (Signed)
Nurse lisa strangber called from Dr.connards office at the cancer center about patient. She says she sent a fax last week on her they information about her pacemaker she is starting radiation treatment today. Nurse will resend the fax as we did not receive it and ill have a nurse fill it out and send back. Her treatment starts today at 3pm

## 2020-01-03 NOTE — Telephone Encounter (Signed)
5/27/2021Fax sent to [employer] to request more information on pt's pacemaker prior to her tx next week.

## 2020-01-03 NOTE — Progress Notes (Signed)
  Radiation Oncology         [phone]) [phone] ________________________________  Name: [name] MRN: [id number]  Date: 01/03/2020  DOB: 1946-05-10  Simulation Verification Note    ICD-10-CM   1. Carcinoma of breast metastatic to bone, unspecified laterality ([place])  C50.919    C79.51     NARRATIVE: The patient was brought to the treatment unit and placed in the planned treatment position. The clinical setup was verified. Then port films were obtained and uploaded to the radiation oncology medical record software.  The treatment beams were carefully compared against the planned radiation fields. The position location and shape of the radiation fields was reviewed. They targeted volume of tissue appears to be appropriately covered by the radiation beams. Organs at risk appear to be excluded as planned.  Based on my personal review, I approved the simulation verification. The patient's treatment will proceed as planned.  -----------------------------------  Blair Promise, PhD, MD  This document serves as a record of services personally performed by Gery Pray, MD. It was created on his behalf by Clerance Lav, a trained medical scribe. The creation of this record is based on the scribe's personal observations and the provider's statements to them. This document has been checked and approved by the attending provider.

## 2020-01-03 NOTE — Telephone Encounter (Signed)
TC to [facility] and LVM to obtain information on pt's pacemaker prior to her tx today.

## 2020-01-04 ENCOUNTER — Telehealth: Payer: Self-pay | Admitting: General Practice

## 2020-01-04 ENCOUNTER — Other Ambulatory Visit: Payer: Self-pay

## 2020-01-04 ENCOUNTER — Ambulatory Visit
Admission: RE | Admit: 2020-01-04 | Discharge: 2020-01-04 | Disposition: A | Discharge status: Home / Self Care | Payer: PPO | Source: Ambulatory Visit | Attending: Radiation Oncology | Admitting: Radiation Oncology

## 2020-01-04 DIAGNOSIS — C50919 Malignant neoplasm of unspecified site of unspecified female breast: Secondary | ICD-10-CM | POA: Diagnosis not present

## 2020-01-04 DIAGNOSIS — C7951 Secondary malignant neoplasm of bone: Secondary | ICD-10-CM | POA: Diagnosis not present

## 2020-01-04 MED ORDER — ONDANSETRON HCL 4 MG PO TABS
4.0000 mg | ORAL_TABLET | Freq: Once | ORAL | Status: AC
  Administered 2020-01-04: 4 mg via ORAL
  Filled 2020-01-04: qty 1

## 2020-01-04 NOTE — Telephone Encounter (Signed)
12/28/2019 04:42 PM Phone (Incoming) [name], [name] (Self) [phone] (H)   Patient requesting pain medicine via social worker.] RN called back and LVM MD will call med in tonight.    By De Burrs, RN

## 2020-01-04 NOTE — Telephone Encounter (Signed)
[place] CSW Progress Notes  Call to patient to complete distress screen, no answer.  Left VM w my contact information and request to return call.  Edwyna Shell, LCSW Clinical Social Worker Phone:  [phone] Cell:  [phone]

## 2020-01-05 ENCOUNTER — Telehealth: Payer: Self-pay | Admitting: General Practice

## 2020-01-05 ENCOUNTER — Ambulatory Visit: Payer: PPO

## 2020-01-05 ENCOUNTER — Encounter: Payer: Self-pay | Admitting: General Practice

## 2020-01-05 ENCOUNTER — Telehealth (HOSPITAL_COMMUNITY): Payer: Self-pay | Admitting: Licensed Clinical Social Worker

## 2020-01-05 ENCOUNTER — Encounter: Payer: Self-pay | Admitting: *Deleted

## 2020-01-05 DIAGNOSIS — C7951 Secondary malignant neoplasm of bone: Secondary | ICD-10-CM

## 2020-01-05 NOTE — Progress Notes (Signed)
Received request from Social Work to place referral for palliative care to assist patient with pain management in the setting of metastatic breast cancer, and chaotic home environment.   Spoke to Dr Marin Olp and he okays placing the referral. Order placed.   Attempted to call patient twice to follow up with her and notify her of new referral. Per SW note, she had to call 911 on mentally ill daughter. One message left, will attempt to follow up with her tomorrow.

## 2020-01-05 NOTE — Progress Notes (Signed)
[place] CSW Progress Notes  Referred for caregiver services to [facility] by [place] [employer].  Communicated w Heart/Vascular CSW re appt conflict and need for wheelchair transport - they are aware of and will work on both.  Communicated w L Strandberg and Arlana Hove re pain issues and potential injury during yesterday's radiation treatment - they willl follow up.  Per Arlana Hove palliative care referral made for pain and symptom mgmt.  [facility] [employer] aware that pt needs more support in scheduling rides and prefers [employer] when possible.  Pt called to say daughter will be taken by EMS to ED due to possible overdose and suicidal statements.    Edwyna Shell, LCSW Clinical Social Worker Phone:  [phone] Cell:  [phone]

## 2020-01-05 NOTE — Progress Notes (Signed)
[place] CSW Progress Notes  Called patient at request from Loraine Maple, Therapist, sports.  States that patient is missing appointments, increasingly confused about transportation arrangements.  Called patient, she is upset about yesterday's radiation treatment "I hit something on the metal table, I was in excruciating pain, it is now hard to walk/move."  Feels like she was injured during transfer from wheelchair to radiation table.   Asked L Strandberg RN to call her re pain control needs and what to do re yesterday's treatment.  Pt states she is taking pain medications as prescribed at home - pain has not been relieved w medications.  Noted possible appointment conflicts for Monday 6/7 - asked RN to review and determine what to do.  Was discharged from inpatient w home health - patient declined these services at that time.  "They were just going to come out and check my vitals."  "They wanted to set up for [facility] for home health."  Lives w adult daughter and grandson.  "I have a child who has lived w me for 50 years, she is bipolar."  Reports mood swings and "difficult, aggressive moods."  Labile, unpredictable.  "It is just difficult, I cannot explain."  Daughter will not help patient stating she "cannot deal with this."  Patient tries to stay out of daughter's way when she is aggressive.  Daughter is on medications for her disorder, but patient feels patient's behavior is not well managed.  While on phone w patient, daughter was heard arguing/shouting in background.  Grandson lived w patient up til [place] - "he is my rock" and is also able to help manage his mothers behavior when he is at home.  Grandson now lives on his own and comes by the house periodically.  Patient has spoken w daughter's PCP (Dr Justin Mend), and her psychiatrist - no resolution for better management of daughters symptoms.  Patient is payee for daughter's disability check.    Patient reports that she is currently overwhelmed, aware that she is possibly  forgetting to take medications as prescribed/on time.  Is particularly concerned about pain management issues.  Reports that she has had issues getting medications filled at pharmacy due to "it may not be approved" on time.  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  Patient does not like [employer], contracting - has made her "late for my first appointment and the Lucianne Lei needs new shocks, they were aggressive in putting me in the [place]."  Wants to stay w [place] will let [facility] [facility] transport know patient preferences. Have also asked [facility] [employer] to stay in close contact w patient re appointments.  Plan:  CSW has worked w Solic[employer] to ask them to more closely supervise patient's transport arrangements as she is unable to fully do this for herself. Asked L Strandberg RN to call patient about current episode of severe pain reported by patient.  Asked Arlana Hove, RN to consider palliative care referral for pain management, asked RN to review Monday's appointments which appear to conflict.  Referred to [employer] via [place] for possible aide services if funds are available. Reached out to Heart/Vascular CSWs for any possible additional support through their service line.   Will follow up by phone w patient next week to assess progress.  Edwyna Shell, LCSW Clinical Social Worker Phone:  [phone] Cell:  [phone]

## 2020-01-05 NOTE — Telephone Encounter (Signed)
[place] CSW Progress Notes  Called patient at request of L Therapist, music - reported patient confused about appointments and having difficulty access transportation.  Unable to reach patient, left VM.  Did ask [facility] [employer], for her thoughts on patients needs.  Per [employer], they are talking to patient daily and providing rides as needed.  Observe some confusion on patient's part, but this has not been a barrier to transportation.  Also checked w oncologist office at [facility], no issues reported at their office.  Asked RN for further clarification of issues/needs and will await return call from patient.  Edwyna Shell, LCSW Clinical Social Worker Phone:  [phone] Cell:  [phone]

## 2020-01-05 NOTE — Telephone Encounter (Signed)
CSW received call from [place] stating conflict with patient's appointment in the HF clinic and Radiation treatment. CSW coordinated with front desk for rescheduled appointment per NP approval for January 30, 2020. CSW contacted Transportation to inform of appointment time and date change. CSW available as needed. Raquel Sarna, [place], [place]

## 2020-01-05 NOTE — Progress Notes (Signed)
[place] CSW Progress Notes  Referred patient to [facility] for community navigation services via [place] [employer].  Consulted w Heart/Vascular CSW Tobe Sos re upcoming appts, transport need, and potential conflict of appts.  Tobe Sos will have HV appts rescheduled so as not to conflict w radiation tx.  Aware that patient needs wheelchair transport to appts.  Sent patient info on [employer] - this provides support for families and friends of people diagnosed w mental illness and can provide information/support for her in finding resources/options for adult daughter needing support.  Advised Arlana Hove RN and Loraine Maple RN that patient has pain issues and concerns re yesterday's radiation visit - asked that they reach out to her to investigate further needs.  Arlana Hove consulted w oncologist and placed referral for palliative care for further help in symptom management.  Transportation services is aware that patient needs more help in arranging her upcoming rides to scheduled appts - CSW also stressed to patient that she needs to be in contact w [employer] re ride scheduling.  Conveyed patient request that [facility] [employer] be used as much as possible - she was not satisfied w other transportation providers.  Edwyna Shell, LCSW Clinical Social Worker Phone:  [phone] Cell:  [phone]

## 2020-01-05 NOTE — Progress Notes (Signed)
Patient returned call. We discussed the new referral.  Also listened to her in regard to the events of the last 24h. Offered her emotional support. Also went through both of her pain medications, reviewing prescribing directions and confirming remaining count of her medication.   Also reviewed her Ibrance prescription with her.

## 2020-01-06 ENCOUNTER — Ambulatory Visit
Admission: RE | Admit: 2020-01-06 | Discharge: 2020-01-06 | Disposition: A | Discharge status: Home / Self Care | Payer: PPO | Source: Ambulatory Visit | Attending: Radiation Oncology | Admitting: Radiation Oncology

## 2020-01-06 DIAGNOSIS — C7951 Secondary malignant neoplasm of bone: Secondary | ICD-10-CM | POA: Diagnosis not present

## 2020-01-06 DIAGNOSIS — C50919 Malignant neoplasm of unspecified site of unspecified female breast: Secondary | ICD-10-CM | POA: Diagnosis not present

## 2020-01-06 NOTE — Progress Notes (Addendum)
Radiation Tech called the clinic to report this patient is diabetic and was severely sweating and had just assisted her in changing her into her street clothes.This call was at 3 pm. 310p Patient oriented to place and kept stating she felt bad and had not eaten much food today.CBG 57. Patient given 2 crackers with peanut butter and 4 oz of gingerale.BP 145/65 P 75 O2 sat 96%. Patient is sitting in her w/c in the dressing room. 327 pm CBG now 41 and pt. given 2 Glucose tabs which equals 8 gms of sugar. Patient is now in the clinic and was given more crackers and peanut butter with another 4 oz of ginger ale. Patient states she is feeling much better and appears more alert. At first pt. Said she had taken her glipizide today and then said she did not.  352 pm CBG 57 and patient given one more glucose tablet. Patient advised to check her blood sugar at home and to let her primary MD know about this episode. Pt. Has an appt, with a nutritionist on 4/8 and told the nutritionist cannot manage her blood sugars and meds and to please call her MD. Pt. States she will do this. 408pm CBG 79. Patient taken upstairs to the entrance of the building and advised to bring a snack with her next time. Pt. Has no appetite and cannot find any food she likes. 430 pm. Patient picked up with her w/c by [facility] transportation to go home.

## 2020-01-06 NOTE — Telephone Encounter (Signed)
Error

## 2020-01-09 ENCOUNTER — Telehealth: Payer: Self-pay | Admitting: *Deleted

## 2020-01-09 ENCOUNTER — Other Ambulatory Visit: Payer: Self-pay

## 2020-01-09 ENCOUNTER — Encounter: Payer: Self-pay | Admitting: Genetic Counselor

## 2020-01-09 ENCOUNTER — Other Ambulatory Visit: Payer: Self-pay | Admitting: *Deleted

## 2020-01-09 ENCOUNTER — Encounter (HOSPITAL_COMMUNITY): Payer: PPO

## 2020-01-09 ENCOUNTER — Ambulatory Visit
Admission: RE | Admit: 2020-01-09 | Discharge: 2020-01-09 | Disposition: A | Discharge status: Home / Self Care | Payer: PPO | Source: Ambulatory Visit | Attending: Radiation Oncology | Admitting: Radiation Oncology

## 2020-01-09 DIAGNOSIS — Z1379 Encounter for other screening for genetic and chromosomal anomalies: Secondary | ICD-10-CM | POA: Insufficient documentation

## 2020-01-09 DIAGNOSIS — C50919 Malignant neoplasm of unspecified site of unspecified female breast: Secondary | ICD-10-CM | POA: Diagnosis not present

## 2020-01-09 DIAGNOSIS — C50912 Malignant neoplasm of unspecified site of left female breast: Secondary | ICD-10-CM

## 2020-01-09 DIAGNOSIS — C7951 Secondary malignant neoplasm of bone: Secondary | ICD-10-CM | POA: Diagnosis not present

## 2020-01-09 NOTE — Telephone Encounter (Signed)
RETURNED PATIENT'S PHONE CALL, LVM FOR A RETURN CALL 

## 2020-01-09 NOTE — Telephone Encounter (Signed)
Oral Oncology Patient Advocate Encounter  An urgent appeal has been completed for the prior authorization denial of Ibrance.   Appeal packet for [name] has been faxed to [phone].    The insurance company states that a 68 hour turn around time is to be expected for urgent redeterminations of denial.   This encounter will continue to be updated until final appeal determination.    [place] Patient [place] Phone [phone] Fax [phone] 01/09/2020 1:38 PM

## 2020-01-09 NOTE — Telephone Encounter (Signed)
Oral Oncology Patient Advocate Encounter  Received notifcation that the appeal for Ibrance prior authorization has been overturned.  PA# [id number] Effective dates: 01/09/20 through 01/08/21  Patients co-pay is $2179.34.  Oral Oncology Clinic will continue to follow.   [place] Patient [place] Phone [phone] Fax [phone] 01/09/2020 4:10 PM

## 2020-01-10 ENCOUNTER — Telehealth: Payer: Self-pay | Admitting: Nutrition

## 2020-01-10 ENCOUNTER — Telehealth: Payer: Self-pay | Admitting: *Deleted

## 2020-01-10 ENCOUNTER — Telehealth: Payer: Self-pay | Admitting: Pharmacy Technician

## 2020-01-10 ENCOUNTER — Inpatient Hospital Stay: Payer: PPO

## 2020-01-10 ENCOUNTER — Inpatient Hospital Stay: Payer: PPO | Attending: Hematology & Oncology | Admitting: Nutrition

## 2020-01-10 ENCOUNTER — Other Ambulatory Visit: Payer: Self-pay | Admitting: Family

## 2020-01-10 ENCOUNTER — Telehealth: Payer: Self-pay | Admitting: General Practice

## 2020-01-10 ENCOUNTER — Other Ambulatory Visit: Payer: Self-pay

## 2020-01-10 ENCOUNTER — Ambulatory Visit
Admission: RE | Admit: 2020-01-10 | Discharge: 2020-01-10 | Disposition: A | Discharge status: Home / Self Care | Payer: PPO | Source: Ambulatory Visit | Attending: Radiation Oncology | Admitting: Radiation Oncology

## 2020-01-10 ENCOUNTER — Ambulatory Visit: Payer: PPO

## 2020-01-10 VITALS — BP 133/80 | HR 90 | Resp 18

## 2020-01-10 DIAGNOSIS — I509 Heart failure, unspecified: Secondary | ICD-10-CM | POA: Insufficient documentation

## 2020-01-10 DIAGNOSIS — C7951 Secondary malignant neoplasm of bone: Secondary | ICD-10-CM | POA: Insufficient documentation

## 2020-01-10 DIAGNOSIS — C50912 Malignant neoplasm of unspecified site of left female breast: Secondary | ICD-10-CM | POA: Diagnosis not present

## 2020-01-10 DIAGNOSIS — Z170 Estrogen receptor positive status [ER+]: Secondary | ICD-10-CM | POA: Diagnosis not present

## 2020-01-10 DIAGNOSIS — E119 Type 2 diabetes mellitus without complications: Secondary | ICD-10-CM | POA: Insufficient documentation

## 2020-01-10 DIAGNOSIS — F419 Anxiety disorder, unspecified: Secondary | ICD-10-CM

## 2020-01-10 DIAGNOSIS — Z7901 Long term (current) use of anticoagulants: Secondary | ICD-10-CM | POA: Diagnosis not present

## 2020-01-10 DIAGNOSIS — Z7984 Long term (current) use of oral hypoglycemic drugs: Secondary | ICD-10-CM | POA: Diagnosis not present

## 2020-01-10 DIAGNOSIS — Z79818 Long term (current) use of other agents affecting estrogen receptors and estrogen levels: Secondary | ICD-10-CM | POA: Insufficient documentation

## 2020-01-10 DIAGNOSIS — I4891 Unspecified atrial fibrillation: Secondary | ICD-10-CM | POA: Diagnosis not present

## 2020-01-10 DIAGNOSIS — C50919 Malignant neoplasm of unspecified site of unspecified female breast: Secondary | ICD-10-CM | POA: Diagnosis not present

## 2020-01-10 LAB — CBC WITH DIFFERENTIAL (CANCER CENTER ONLY)
Abs Immature Granulocytes: 0.09 10*3/uL — ABNORMAL HIGH (ref 0.00–0.07)
Basophils Absolute: 0.1 10*3/uL (ref 0.0–0.1)
Basophils Relative: 1 %
Eosinophils Absolute: 0.2 10*3/uL (ref 0.0–0.5)
Eosinophils Relative: 2 %
HCT: 36.3 % (ref 36.0–46.0)
Hemoglobin: 10.9 g/dL — ABNORMAL LOW (ref 12.0–15.0)
Immature Granulocytes: 1 %
Lymphocytes Relative: 7 %
Lymphs Abs: 0.5 10*3/uL — ABNORMAL LOW (ref 0.7–4.0)
MCH: 25.3 pg — ABNORMAL LOW (ref 26.0–34.0)
MCHC: 30 g/dL (ref 30.0–36.0)
MCV: 84.2 fL (ref 80.0–100.0)
Monocytes Absolute: 0.4 10*3/uL (ref 0.1–1.0)
Monocytes Relative: 5 %
Neutro Abs: 6.2 10*3/uL (ref 1.7–7.7)
Neutrophils Relative %: 84 %
Platelet Count: 319 10*3/uL (ref 150–400)
RBC: 4.31 MIL/uL (ref 3.87–5.11)
RDW: 17.7 % — ABNORMAL HIGH (ref 11.5–15.5)
WBC Count: 7.4 10*3/uL (ref 4.0–10.5)
nRBC: 0 % (ref 0.0–0.2)

## 2020-01-10 LAB — CMP (CANCER CENTER ONLY)
ALT: 21 U/L (ref 0–44)
AST: 30 U/L (ref 15–41)
Albumin: 3.7 g/dL (ref 3.5–5.0)
Alkaline Phosphatase: 155 U/L — ABNORMAL HIGH (ref 38–126)
Anion gap: 13 (ref 5–15)
BUN: 52 mg/dL — ABNORMAL HIGH (ref 8–23)
CO2: 21 mmol/L — ABNORMAL LOW (ref 22–32)
Calcium: 9.4 mg/dL (ref 8.9–10.3)
Chloride: 102 mmol/L (ref 98–111)
Creatinine: 2.12 mg/dL — ABNORMAL HIGH (ref 0.44–1.00)
GFR, Est AFR Am: 26 mL/min — ABNORMAL LOW (ref >60)
GFR, Estimated: 23 mL/min — ABNORMAL LOW (ref >60)
Glucose, Bld: 91 mg/dL (ref 70–99)
Potassium: 5.6 mmol/L — ABNORMAL HIGH (ref 3.5–5.1)
Sodium: 136 mmol/L (ref 135–145)
Total Bilirubin: 0.8 mg/dL (ref 0.3–1.2)
Total Protein: 7.3 g/dL (ref 6.5–8.1)

## 2020-01-10 LAB — GLUCOSE, CAPILLARY
Glucose-Capillary: 57 mg/dL — ABNORMAL LOW (ref 70–99)
Glucose-Capillary: 41 mg/dL — CL (ref 70–99)
Glucose-Capillary: 57 mg/dL — ABNORMAL LOW (ref 70–99)
Glucose-Capillary: 79 mg/dL (ref 70–99)

## 2020-01-10 MED ORDER — FULVESTRANT 250 MG/5ML IM SOLN
INTRAMUSCULAR | Status: AC
  Filled 2020-01-10: qty 10

## 2020-01-10 MED ORDER — FULVESTRANT 250 MG/5ML IM SOLN
500.0000 mg | Freq: Once | INTRAMUSCULAR | Status: AC
  Administered 2020-01-10: 500 mg via INTRAMUSCULAR

## 2020-01-10 NOTE — Telephone Encounter (Signed)
Called patient and left a message to schedule a Palliative care home visit. Contact information left on voicemail for return call.

## 2020-01-10 NOTE — Progress Notes (Signed)
74 year old female diagnosed with metastatic breast cancer. She is a patient of Dr. Marin Olp and Dr. Sondra Come.  PMH includes CHF, Atrial Fib, DM, CAD.  Medications include Lasix, Glucotrol, Zofran.  Labs include Glucose 170, BUN 43, Creatinine 1.79, Albumin 4.2.  Height: 5'4". Weight: 182.8 pounds on May 25. UBW: 188 pounds in December 2020. BMI: 31.38.  Contacted patient by telephone.   Nutrition Diagnosis:  Intervention:  Monitoring, Evaluation, Goals:  Next Visit:

## 2020-01-10 NOTE — Telephone Encounter (Signed)
Oral Oncology Patient Advocate Encounter  Coordinated with Wynn Maudlin, patient advocate at [facility], to meet with patient after radiation appointment to sign an application for [facility] in an effort to reduce patient's out of pocket expense for Ibrance to $0.   Application completed and faxed to [phone] on 01/09/20.   [employer] patient assistance phone number for follow up is [phone].   This encounter will be updated until final determination.   [place] Patient [place] Phone [phone] Fax [phone] 01/10/2020 9:09 AM

## 2020-01-10 NOTE — Telephone Encounter (Signed)
Contacted patient by telephone. Patient asked to reschedule nutrition appointment. Rescheduled for Tuesday after radiation at the [facility]. Patient informed.

## 2020-01-10 NOTE — Telephone Encounter (Signed)
[place] CSW Progress Notes  Call from patient, she is distressed by daughter's overdose last Wednesday.  Patient got into argument w daughter over whether daughter could bring a dog into the home she shares w patient.  After argument, daughter overdosed and was was taken by EMS for evaluation.  Could not complete call w patient as her ride arrives to transport her to [facility] for treatment.  Pt will try to call back this afternoon to discuss further.  As CSW is off for rest of week, a phone visit can be scheduled for next week if desired.  Patient can also be referred for ongoing mental health support.  Edwyna Shell, LCSW Clinical Social Worker Phone:  [phone] Cell:  [phone]

## 2020-01-10 NOTE — Telephone Encounter (Signed)
Call received from [place] to advise Dr. Marin Olp that pt is in need of a referral to the [facility] for medication management.  Dr. Marin Olp notified and order received for [facility] referral for pt.  Order placed per S. DeRidder NP.

## 2020-01-10 NOTE — Patient Instructions (Signed)
Fulvestrant injection What is this medicine? FULVESTRANT (ful VES trant) blocks the effects of estrogen. It is used to treat breast cancer. This medicine may be used for other purposes; ask your health care provider or pharmacist if you have questions. COMMON BRAND NAME(S): FASLODEX What should I tell my health care provider before I take this medicine? They need to know if you have any of these conditions:  bleeding disorders  liver disease  low blood counts, like low white cell, platelet, or red cell counts  an unusual or allergic reaction to fulvestrant, other medicines, foods, dyes, or preservatives  pregnant or trying to get pregnant  breast-feeding How should I use this medicine? This medicine is for injection into a muscle. It is usually given by a health care professional in a hospital or clinic setting. Talk to your pediatrician regarding the use of this medicine in children. Special care may be needed. Overdosage: If you think you have taken too much of this medicine contact a poison control center or emergency room at once. NOTE: This medicine is only for you. Do not share this medicine with others. What if I miss a dose? It is important not to miss your dose. Call your doctor or health care professional if you are unable to keep an appointment. What may interact with this medicine?  medicines that treat or prevent blood clots like warfarin, enoxaparin, dalteparin, apixaban, dabigatran, and rivaroxaban This list may not describe all possible interactions. Give your health care provider a list of all the medicines, herbs, non-prescription drugs, or dietary supplements you use. Also tell them if you smoke, drink alcohol, or use illegal drugs. Some items may interact with your medicine. What should I watch for while using this medicine? Your condition will be monitored carefully while you are receiving this medicine. You will need important blood work done while you are taking  this medicine. Do not become pregnant while taking this medicine or for at least 1 year after stopping it. Women of child-bearing potential will need to have a negative pregnancy test before starting this medicine. Women should inform their doctor if they wish to become pregnant or think they might be pregnant. There is a potential for serious side effects to an unborn child. Men should inform their doctors if they wish to father a child. This medicine may lower sperm counts. Talk to your health care professional or pharmacist for more information. Do not breast-feed an infant while taking this medicine or for 1 year after the last dose. What side effects may I notice from receiving this medicine? Side effects that you should report to your doctor or health care professional as soon as possible:  allergic reactions like skin rash, itching or hives, swelling of the face, lips, or tongue  feeling faint or lightheaded, falls  pain, tingling, numbness, or weakness in the legs  signs and symptoms of infection like fever or chills; cough; flu-like symptoms; sore throat  vaginal bleeding Side effects that usually do not require medical attention (report to your doctor or health care professional if they continue or are bothersome):  aches, pains  constipation  diarrhea  headache  hot flashes  nausea, vomiting  pain at site where injected  stomach pain This list may not describe all possible side effects. Call your doctor for medical advice about side effects. You may report side effects to FDA at 1-800-FDA-1088. Where should I keep my medicine? This drug is given in a hospital or clinic and will   not be stored at home. NOTE: This sheet is a summary. It may not cover all possible information. If you have questions about this medicine, talk to your doctor, pharmacist, or health care provider.  2020 Elsevier/Gold Standard (2017-10-29 11:34:41)  

## 2020-01-11 ENCOUNTER — Telehealth: Payer: Self-pay | Admitting: General Practice

## 2020-01-11 ENCOUNTER — Ambulatory Visit
Admission: RE | Admit: 2020-01-11 | Discharge: 2020-01-11 | Disposition: A | Discharge status: Home / Self Care | Payer: PPO | Source: Ambulatory Visit | Attending: Radiation Oncology | Admitting: Radiation Oncology

## 2020-01-11 DIAGNOSIS — C50919 Malignant neoplasm of unspecified site of unspecified female breast: Secondary | ICD-10-CM | POA: Diagnosis not present

## 2020-01-11 DIAGNOSIS — C7951 Secondary malignant neoplasm of bone: Secondary | ICD-10-CM | POA: Diagnosis not present

## 2020-01-11 LAB — CANCER ANTIGEN 27.29: CA 27.29: 1528.8 U/mL — ABNORMAL HIGH (ref 0.0–38.6)

## 2020-01-11 NOTE — Telephone Encounter (Signed)
[place] CSW Progress Notes  Call to patient to follow up on brief phone call yesterday.  No answer, left VM w my contact information and encouragement to call.  Edwyna Shell, LCSW Clinical Social Worker Phone:  [phone] Cell:  [phone]

## 2020-01-12 ENCOUNTER — Ambulatory Visit
Admission: RE | Admit: 2020-01-12 | Discharge: 2020-01-12 | Disposition: A | Discharge status: Home / Self Care | Payer: PPO | Source: Ambulatory Visit | Attending: Radiation Oncology | Admitting: Radiation Oncology

## 2020-01-12 ENCOUNTER — Other Ambulatory Visit: Payer: Self-pay

## 2020-01-12 DIAGNOSIS — C7951 Secondary malignant neoplasm of bone: Secondary | ICD-10-CM | POA: Diagnosis not present

## 2020-01-12 DIAGNOSIS — C50919 Malignant neoplasm of unspecified site of unspecified female breast: Secondary | ICD-10-CM | POA: Diagnosis not present

## 2020-01-12 NOTE — Telephone Encounter (Signed)
Oral Oncology Patient Advocate Encounter  Received a fax this morning from [employer] stating that previous decision overturning the denied prior authorization for Ibrance had been incorrectly approved.  Case was reopened and information was reviewed.  Prior authorization is still denied.  Appeal decision upheld PA denial.  [name] Patient [place] Phone [phone] Fax [phone] 01/12/2020 9:30 AM

## 2020-01-13 ENCOUNTER — Ambulatory Visit
Admission: RE | Admit: 2020-01-13 | Discharge: 2020-01-13 | Disposition: A | Discharge status: Home / Self Care | Payer: PPO | Source: Ambulatory Visit | Attending: Radiation Oncology | Admitting: Radiation Oncology

## 2020-01-13 ENCOUNTER — Other Ambulatory Visit: Payer: Self-pay

## 2020-01-13 DIAGNOSIS — C50919 Malignant neoplasm of unspecified site of unspecified female breast: Secondary | ICD-10-CM | POA: Diagnosis not present

## 2020-01-13 DIAGNOSIS — C7951 Secondary malignant neoplasm of bone: Secondary | ICD-10-CM | POA: Diagnosis not present

## 2020-01-13 NOTE — Telephone Encounter (Signed)
Called to check the status of patient assistance application for patient.  [employer] rep stated that the application has been worked on and it is still being processed.  She told me to call back on Tuesday for an update if I haven't received any information by then.  [place] Patient [place] Phone [phone] Fax [phone] 01/13/2020 10:12 AM

## 2020-01-16 ENCOUNTER — Other Ambulatory Visit: Payer: Self-pay

## 2020-01-16 ENCOUNTER — Encounter: Payer: Self-pay | Admitting: Genetic Counselor

## 2020-01-16 ENCOUNTER — Ambulatory Visit
Admission: RE | Admit: 2020-01-16 | Discharge: 2020-01-16 | Disposition: A | Discharge status: Home / Self Care | Payer: PPO | Source: Ambulatory Visit | Attending: Radiation Oncology | Admitting: Radiation Oncology

## 2020-01-16 ENCOUNTER — Encounter: Payer: Self-pay | Admitting: *Deleted

## 2020-01-16 DIAGNOSIS — C7951 Secondary malignant neoplasm of bone: Secondary | ICD-10-CM | POA: Diagnosis not present

## 2020-01-16 DIAGNOSIS — C50919 Malignant neoplasm of unspecified site of unspecified female breast: Secondary | ICD-10-CM | POA: Diagnosis not present

## 2020-01-16 NOTE — Progress Notes (Signed)
Reached out to patient about palliative care referral. Per notes, they reached out on 01/10/20 and left a message for patient to call back to arrange home visit. Patient has not yet called back.  Spoke to patient about the importance of returning the call. Educated her to the purpose of palliative care and how they may help with symptom management including her n/v and pain. She agreed that they could be very beneficial and would call them back to set up an appointment.  Also reviewed with her that her genetic testing results were back and that genetics would be reaching out to her to schedule an appointment to review the results. She understood.  Overall patient said today was a good day. She is a little worried that her daughter will be discharged tomorrow and return to her home. She knows to reach out for assistance as needed.

## 2020-01-17 ENCOUNTER — Inpatient Hospital Stay: Payer: PPO | Admitting: Nutrition

## 2020-01-17 ENCOUNTER — Encounter: Payer: Self-pay | Admitting: Nutrition

## 2020-01-17 ENCOUNTER — Other Ambulatory Visit: Payer: Self-pay

## 2020-01-17 ENCOUNTER — Ambulatory Visit
Admission: RE | Admit: 2020-01-17 | Discharge: 2020-01-17 | Disposition: A | Discharge status: Home / Self Care | Payer: PPO | Source: Ambulatory Visit | Attending: Radiation Oncology | Admitting: Radiation Oncology

## 2020-01-17 DIAGNOSIS — C7951 Secondary malignant neoplasm of bone: Secondary | ICD-10-CM | POA: Diagnosis not present

## 2020-01-17 DIAGNOSIS — C50919 Malignant neoplasm of unspecified site of unspecified female breast: Secondary | ICD-10-CM | POA: Diagnosis not present

## 2020-01-17 NOTE — Progress Notes (Signed)
Patient did not show up for nutrition appointment. 

## 2020-01-18 ENCOUNTER — Other Ambulatory Visit: Payer: Self-pay

## 2020-01-18 ENCOUNTER — Ambulatory Visit
Admission: RE | Admit: 2020-01-18 | Discharge: 2020-01-18 | Disposition: A | Discharge status: Home / Self Care | Payer: PPO | Source: Ambulatory Visit | Attending: Radiation Oncology | Admitting: Radiation Oncology

## 2020-01-18 DIAGNOSIS — C7951 Secondary malignant neoplasm of bone: Secondary | ICD-10-CM | POA: Diagnosis not present

## 2020-01-18 DIAGNOSIS — C50919 Malignant neoplasm of unspecified site of unspecified female breast: Secondary | ICD-10-CM | POA: Diagnosis not present

## 2020-01-18 NOTE — Telephone Encounter (Signed)
Oral Oncology Patient Advocate Encounter  Received notification from [place] Patient Assistance program that patient has been successfully enrolled into their program to receive Ibrance from the manufacturer at $0 out of pocket until 08/03/20.    I called and spoke with patient.  She knows we will have to re-apply.   Specialty Pharmacy that will dispense medication is [facility].  Patient knows to call the office with questions or concerns.   Oral Oncology Clinic will continue to follow.  [place] Patient [place] Phone [phone] Fax [phone] 01/18/2020 3:01 PM

## 2020-01-18 NOTE — Telephone Encounter (Signed)
Called to check the status of Ibrance application and rep stated that she saw the application but it has not been processed.  [employer] is having an influx of applications and heavy call volume.  Rep stated that she would try to get application processed this week.  I will call back on Friday to check the status of the application again.  [place] Patient [place] Phone [phone] Fax [phone] 01/18/2020 9:41 AM

## 2020-01-19 ENCOUNTER — Other Ambulatory Visit: Payer: Self-pay

## 2020-01-19 ENCOUNTER — Ambulatory Visit
Admission: RE | Admit: 2020-01-19 | Discharge: 2020-01-19 | Disposition: A | Discharge status: Home / Self Care | Payer: PPO | Source: Ambulatory Visit | Attending: Radiation Oncology | Admitting: Radiation Oncology

## 2020-01-19 ENCOUNTER — Encounter: Payer: Self-pay | Admitting: *Deleted

## 2020-01-19 DIAGNOSIS — C7951 Secondary malignant neoplasm of bone: Secondary | ICD-10-CM | POA: Diagnosis not present

## 2020-01-19 DIAGNOSIS — C50919 Malignant neoplasm of unspecified site of unspecified female breast: Secondary | ICD-10-CM | POA: Diagnosis not present

## 2020-01-19 NOTE — Progress Notes (Signed)
Spoke to patient this afternoon. She states she very fatigued after her radiation today. She states she is still having some nausea. She feels like her pain is getting better. She has two more radiation treatments.  I asked her if she had called Palliative Care yet. She states she had not. Again reviewed all the benefits they can provide the patient. Explained how they can help with symptom management including pain, nausea and fatigue. She said she would call them today.  Patient agreed for me to call her back tomorrow and see if the appointment had been made.   Patient also missed her nutrition appointment from Tuesday. She agreed to reschedule this to a phone call on Tuesday. Confirmed that she would be awake at 9:45a and she stated that would be a good time for her. Appointment made.  Will follow up with patient tomorrow. Will also follow up with [facility] to make sure they continue to try to contact patient and not close the referral.  Also checked on her with her daughter being home. She responded everything was 'okay'. Will follow up with patient tomorrow.

## 2020-01-19 NOTE — Progress Notes (Signed)
Called patient to follow up on discussion on Tuesday. Wanted to make sure she had set up her palliative care appointment. Also need to ask about rescheduling the nutrition appointment she missed on 6/15.  No answer. Left voicemail.

## 2020-01-20 ENCOUNTER — Encounter: Payer: Self-pay | Admitting: *Deleted

## 2020-01-20 ENCOUNTER — Ambulatory Visit: Payer: PPO

## 2020-01-20 ENCOUNTER — Ambulatory Visit
Admission: RE | Admit: 2020-01-20 | Discharge: 2020-01-20 | Disposition: A | Discharge status: Home / Self Care | Payer: PPO | Source: Ambulatory Visit | Attending: Radiation Oncology | Admitting: Radiation Oncology

## 2020-01-20 ENCOUNTER — Other Ambulatory Visit: Payer: Self-pay

## 2020-01-20 ENCOUNTER — Telehealth: Payer: Self-pay | Admitting: *Deleted

## 2020-01-20 DIAGNOSIS — C7951 Secondary malignant neoplasm of bone: Secondary | ICD-10-CM | POA: Diagnosis not present

## 2020-01-20 DIAGNOSIS — C50919 Malignant neoplasm of unspecified site of unspecified female breast: Secondary | ICD-10-CM | POA: Diagnosis not present

## 2020-01-20 NOTE — Progress Notes (Signed)
Called and spoke to patient today. She is having an ok day. She has not called palliative care back. She verbalizes understanding about their importance in her care team, but doesn't think to call them.   Reminded her of her nutrition appointment over the phone Tuesday morning. Also reviewed her appointment with this office on Thursday. She states she has them both written down.  Called [facility] and left message requesting call back this morning. This afternoon I haven't received a call back. Message sent to [facility] who had an encounter in her chart, having attempted phone contact to schedule her referral. I requested that they continue to call her to schedule this appointment.   Will continue to follow to ensure the referral is scheduled.

## 2020-01-20 NOTE — Telephone Encounter (Signed)
Called patient and left a voicemail to schedule a Palliative care home visit. Provided my contact information for a return call.

## 2020-01-20 NOTE — Progress Notes (Signed)
Received call back from [place]. She is agreeable to continue trying to reach out to patient to schedule appointment. She will call later this afternoon in hopes of scheduling something for next week.

## 2020-01-23 ENCOUNTER — Other Ambulatory Visit: Payer: Self-pay | Admitting: Radiation Oncology

## 2020-01-23 ENCOUNTER — Other Ambulatory Visit: Payer: Self-pay

## 2020-01-23 ENCOUNTER — Encounter: Payer: Self-pay | Admitting: Radiation Oncology

## 2020-01-23 ENCOUNTER — Ambulatory Visit
Admission: RE | Admit: 2020-01-23 | Discharge: 2020-01-23 | Disposition: A | Discharge status: Home / Self Care | Payer: PPO | Source: Ambulatory Visit | Attending: Radiation Oncology | Admitting: Radiation Oncology

## 2020-01-23 DIAGNOSIS — C50919 Malignant neoplasm of unspecified site of unspecified female breast: Secondary | ICD-10-CM

## 2020-01-23 DIAGNOSIS — C7951 Secondary malignant neoplasm of bone: Secondary | ICD-10-CM | POA: Diagnosis not present

## 2020-01-23 MED ORDER — ONDANSETRON 8 MG PO TBDP: 8.0000 mg | ORAL_TABLET | Freq: Three times a day (TID) | ORAL | 0 refills | Status: AC | PRN

## 2020-01-24 ENCOUNTER — Telehealth: Payer: Self-pay | Admitting: Nutrition

## 2020-01-24 ENCOUNTER — Inpatient Hospital Stay: Payer: PPO | Admitting: Nutrition

## 2020-01-24 NOTE — Telephone Encounter (Signed)
Contacted patient by telephone. She reports she is OK. States she cannot eat much at all but feels hungry. She found a "soy drink" that she tolerates. She still has occasional nausea and has resumed taking Zofran. She complains of food tasting too sweet. Educated patient on cold foods with increased protein such as yogurt, cottage cheese and egg salad. Educated on strategies for improving taste of food and decreasing sweet taste. Encouraged anti-emetics for nausea as needed. I will mail her nutrition fact sheets on increasing calories and protein, soft protein foods, taste alterations and nausea and vomiting. She has my name and phone number for questions.

## 2020-01-25 ENCOUNTER — Other Ambulatory Visit (HOSPITAL_COMMUNITY): Payer: Self-pay | Admitting: Internal Medicine

## 2020-01-25 ENCOUNTER — Other Ambulatory Visit: Payer: Self-pay | Admitting: *Deleted

## 2020-01-25 ENCOUNTER — Other Ambulatory Visit (HOSPITAL_COMMUNITY): Payer: Self-pay | Admitting: Adult Health

## 2020-01-25 DIAGNOSIS — C50912 Malignant neoplasm of unspecified site of left female breast: Secondary | ICD-10-CM

## 2020-01-25 DIAGNOSIS — C7951 Secondary malignant neoplasm of bone: Secondary | ICD-10-CM

## 2020-01-26 ENCOUNTER — Other Ambulatory Visit: Payer: Self-pay

## 2020-01-26 ENCOUNTER — Inpatient Hospital Stay: Payer: PPO

## 2020-01-26 ENCOUNTER — Encounter: Payer: Self-pay | Admitting: *Deleted

## 2020-01-26 ENCOUNTER — Inpatient Hospital Stay (HOSPITAL_BASED_OUTPATIENT_CLINIC_OR_DEPARTMENT_OTHER): Payer: PPO | Admitting: Hematology & Oncology

## 2020-01-26 ENCOUNTER — Encounter: Payer: Self-pay | Admitting: Hematology & Oncology

## 2020-01-26 VITALS — BP 130/71 | HR 90 | Temp 96.6°F | Resp 19 | Ht 64.0 in

## 2020-01-26 DIAGNOSIS — C7951 Secondary malignant neoplasm of bone: Secondary | ICD-10-CM | POA: Diagnosis not present

## 2020-01-26 DIAGNOSIS — C50912 Malignant neoplasm of unspecified site of left female breast: Secondary | ICD-10-CM | POA: Diagnosis not present

## 2020-01-26 DIAGNOSIS — C50911 Malignant neoplasm of unspecified site of right female breast: Secondary | ICD-10-CM

## 2020-01-26 DIAGNOSIS — C50919 Malignant neoplasm of unspecified site of unspecified female breast: Secondary | ICD-10-CM

## 2020-01-26 LAB — CMP (CANCER CENTER ONLY)
ALT: 17 U/L (ref 0–44)
AST: 20 U/L (ref 15–41)
Albumin: 3.9 g/dL (ref 3.5–5.0)
Alkaline Phosphatase: 124 U/L (ref 38–126)
Anion gap: 8 (ref 5–15)
BUN: 30 mg/dL — ABNORMAL HIGH (ref 8–23)
CO2: 27 mmol/L (ref 22–32)
Calcium: 9.5 mg/dL (ref 8.9–10.3)
Chloride: 100 mmol/L (ref 98–111)
Creatinine: 1.33 mg/dL — ABNORMAL HIGH (ref 0.44–1.00)
GFR, Est AFR Am: 46 mL/min — ABNORMAL LOW (ref >60)
GFR, Estimated: 40 mL/min — ABNORMAL LOW (ref >60)
Glucose, Bld: 133 mg/dL — ABNORMAL HIGH (ref 70–99)
Potassium: 5.4 mmol/L — ABNORMAL HIGH (ref 3.5–5.1)
Sodium: 135 mmol/L (ref 135–145)
Total Bilirubin: 1.1 mg/dL (ref 0.3–1.2)
Total Protein: 7.3 g/dL (ref 6.5–8.1)

## 2020-01-26 LAB — CBC WITH DIFFERENTIAL (CANCER CENTER ONLY)
Abs Immature Granulocytes: 0.02 10*3/uL (ref 0.00–0.07)
Basophils Absolute: 0.1 10*3/uL (ref 0.0–0.1)
Basophils Relative: 2 %
Eosinophils Absolute: 0.1 10*3/uL (ref 0.0–0.5)
Eosinophils Relative: 3 %
HCT: 38.8 % (ref 36.0–46.0)
Hemoglobin: 11.7 g/dL — ABNORMAL LOW (ref 12.0–15.0)
Immature Granulocytes: 1 %
Lymphocytes Relative: 10 %
Lymphs Abs: 0.3 10*3/uL — ABNORMAL LOW (ref 0.7–4.0)
MCH: 26.1 pg (ref 26.0–34.0)
MCHC: 30.2 g/dL (ref 30.0–36.0)
MCV: 86.4 fL (ref 80.0–100.0)
Monocytes Absolute: 0.1 10*3/uL (ref 0.1–1.0)
Monocytes Relative: 4 %
Neutro Abs: 2.2 10*3/uL (ref 1.7–7.7)
Neutrophils Relative %: 80 %
Platelet Count: 123 10*3/uL — ABNORMAL LOW (ref 150–400)
RBC: 4.49 MIL/uL (ref 3.87–5.11)
RDW: 20.2 % — ABNORMAL HIGH (ref 11.5–15.5)
WBC Count: 2.7 10*3/uL — ABNORMAL LOW (ref 4.0–10.5)
nRBC: 0 % (ref 0.0–0.2)

## 2020-01-26 MED ORDER — FULVESTRANT 250 MG/5ML IM SOLN
INTRAMUSCULAR | Status: AC
  Filled 2020-01-26: qty 10

## 2020-01-26 MED ORDER — DENOSUMAB 120 MG/1.7ML ~~LOC~~ SOLN
120.0000 mg | Freq: Once | SUBCUTANEOUS | Status: AC
  Administered 2020-01-26: 120 mg via SUBCUTANEOUS

## 2020-01-26 MED ORDER — FULVESTRANT 250 MG/5ML IM SOLN
500.0000 mg | Freq: Once | INTRAMUSCULAR | Status: AC
  Administered 2020-01-26: 500 mg via INTRAMUSCULAR

## 2020-01-26 MED ORDER — DENOSUMAB 120 MG/1.7ML ~~LOC~~ SOLN
SUBCUTANEOUS | Status: AC
  Filled 2020-01-26: qty 1.7

## 2020-01-26 NOTE — Patient Instructions (Signed)
Fulvestrant injection What is this medicine? FULVESTRANT (ful VES trant) blocks the effects of estrogen. It is used to treat breast cancer. This medicine may be used for other purposes; ask your health care provider or pharmacist if you have questions. COMMON BRAND NAME(S): FASLODEX What should I tell my health care provider before I take this medicine? They need to know if you have any of these conditions:  bleeding disorders  liver disease  low blood counts, like low white cell, platelet, or red cell counts  an unusual or allergic reaction to fulvestrant, other medicines, foods, dyes, or preservatives  pregnant or trying to get pregnant  breast-feeding How should I use this medicine? This medicine is for injection into a muscle. It is usually given by a health care professional in a hospital or clinic setting. Talk to your pediatrician regarding the use of this medicine in children. Special care may be needed. Overdosage: If you think you have taken too much of this medicine contact a poison control center or emergency room at once. NOTE: This medicine is only for you. Do not share this medicine with others. What if I miss a dose? It is important not to miss your dose. Call your doctor or health care professional if you are unable to keep an appointment. What may interact with this medicine?  medicines that treat or prevent blood clots like warfarin, enoxaparin, dalteparin, apixaban, dabigatran, and rivaroxaban This list may not describe all possible interactions. Give your health care provider a list of all the medicines, herbs, non-prescription drugs, or dietary supplements you use. Also tell them if you smoke, drink alcohol, or use illegal drugs. Some items may interact with your medicine. What should I watch for while using this medicine? Your condition will be monitored carefully while you are receiving this medicine. You will need important blood work done while you are taking  this medicine. Do not become pregnant while taking this medicine or for at least 1 year after stopping it. Women of child-bearing potential will need to have a negative pregnancy test before starting this medicine. Women should inform their doctor if they wish to become pregnant or think they might be pregnant. There is a potential for serious side effects to an unborn child. Men should inform their doctors if they wish to father a child. This medicine may lower sperm counts. Talk to your health care professional or pharmacist for more information. Do not breast-feed an infant while taking this medicine or for 1 year after the last dose. What side effects may I notice from receiving this medicine? Side effects that you should report to your doctor or health care professional as soon as possible:  allergic reactions like skin rash, itching or hives, swelling of the face, lips, or tongue  feeling faint or lightheaded, falls  pain, tingling, numbness, or weakness in the legs  signs and symptoms of infection like fever or chills; cough; flu-like symptoms; sore throat  vaginal bleeding Side effects that usually do not require medical attention (report to your doctor or health care professional if they continue or are bothersome):  aches, pains  constipation  diarrhea  headache  hot flashes  nausea, vomiting  pain at site where injected  stomach pain This list may not describe all possible side effects. Call your doctor for medical advice about side effects. You may report side effects to FDA at 1-800-FDA-1088. Where should I keep my medicine? This drug is given in a hospital or clinic and will   not be stored at home. NOTE: This sheet is a summary. It may not cover all possible information. If you have questions about this medicine, talk to your doctor, pharmacist, or health care provider.  2020 Elsevier/Gold Standard (2017-10-29 11:34:41)  

## 2020-01-26 NOTE — Progress Notes (Signed)
Hematology and Oncology Follow Up Visit  [name] [id number] 1945/10/17 74 y.o. 01/26/2020   Principle Diagnosis:   Metastatic breast cancer-ER positive/HER-2 positive -bone metastasis  Congestive heart failure  Current Therapy:    Faslodex 500 mg IM monthly -start on 12/26/2019  Ibrance 100 mg po q day (21/7) -- started in 01/2020  Xgeva 120 mg subcu q. 3 months-next dose in 04/2020  Palliative radiotherapy to the spine.     Interim History:  [name] is back for her follow-up.  She still is not doing all that great.  She comes in in wheelchair.  She does has a lot of issues at home.  Apparently, her daughter tried to overdose on tramadol.  This really has affected her.  She did finish her radiation therapy to the back.  I think she still has back issues.  She started the [place] and has 1 cycle so far.  I think her last CA 27.29 was 1530.  She really is not a candidate for anything aggressive.  Because of her congestive heart failure, we cannot give her Herceptin based therapy.  She is not strong enough to take any chemotherapy.  Hopefully, we will find that the Faslodex/Ibrance will eventually help.  She just has a poor performance status.  I really had hoped that she would be feeling better by now.  She is not having diarrhea.  She may have a little bit of constipation.  I think she is back in atrial fibrillation.  Her card exam is clearly irregular.  I think she sees the cardiologist next week.  She has had no bleeding.  She has had no fever.  She has had some slight leg swelling.  Currently, her performance status is no better than ECOG 2-3.  Medications:  Current Outpatient Medications:  .  acetaminophen (TYLENOL) 500 MG tablet, Take 500-1,000 mg by mouth every 6 (six) hours as needed for headache (pain)., Disp: , Rfl:  .  amiodarone (PACERONE) 200 MG tablet, Take 200 mg by mouth 2 (two) times daily., Disp: , Rfl:  .  Blood Glucose Monitoring Suppl  (TRUE METRIX METER) DEVI, 1 each by Does not apply route 2 (two) times daily., Disp: 1 Device, Rfl: 0 .  ELIQUIS 5 MG TABS tablet, TAKE ONE TABLET BY MOUTH TWICE A DAY, Disp: 180 tablet, Rfl: 3 .  furosemide (LASIX) 80 MG tablet, Take 1 tablet (80 mg total) by mouth daily., Disp: 30 tablet, Rfl: 5 .  glipiZIDE (GLUCOTROL) 5 MG tablet, Take 1 tablet (5 mg total) by mouth daily., Disp: 90 tablet, Rfl: 1 .  glucose blood (TRUE METRIX BLOOD GLUCOSE TEST) test strip, Use as instructed twice daily, Disp: 100 each, Rfl: 12 .  losartan (COZAAR) 25 MG tablet, Take 25 mg by mouth daily., Disp: , Rfl:  .  Magnesium Hydroxide (MILK OF MAGNESIA CONCENTRATE PO), Take by mouth every 8 (eight) hours as needed., Disp: , Rfl:  .  ondansetron (ZOFRAN) 4 MG tablet, Take 1 tablet by mouth every 4 (four) hours as needed., Disp: , Rfl:  .  ondansetron (ZOFRAN-ODT) 8 MG disintegrating tablet, Take 1 tablet (8 mg total) by mouth every 8 (eight) hours as needed for nausea or vomiting., Disp: 30 tablet, Rfl: 0 .  oxyCODONE (OXY IR/ROXICODONE) 5 MG immediate release tablet, Take 1 tablet (5 mg total) by mouth every 6 (six) hours as needed for moderate pain or severe pain., Disp: 90 tablet, Rfl: 0 .  oxyCODONE (OXYCONTIN) 20 mg 12  hr tablet, Take 1 tablet (20 mg total) by mouth every 12 (twelve) hours., Disp: 60 tablet, Rfl: 0 .  palbociclib (IBRANCE) 100 MG tablet, Take 1 tablet (100 mg total) by mouth daily. Take for 21 days on, 7 days off, repeat every 28 days., Disp: 21 tablet, Rfl: 6 .  TRUEPLUS LANCETS 28G MISC, Use as instructed twice daily., Disp: 60 each, Rfl: 5  Allergies:  Allergies  Allergen Reactions  . Levaquin [Levofloxacin] Other (See Comments)    Suicidal thoughts, extreme anxiety, panic attack  . Tramadol Other (See Comments)    Fuzzy thinking and agitation per patient  . Phenergan [Promethazine Hcl] Other (See Comments)    Restless    Past Medical History, Surgical history, Social history, and Family  History were reviewed and updated.  Review of Systems: Review of Systems  Constitutional: Positive for fatigue.  HENT:  Negative.   Eyes: Negative.   Respiratory: Positive for shortness of breath.   Cardiovascular: Positive for leg swelling.  Gastrointestinal: Negative.   Endocrine: Negative.   Genitourinary: Negative.    Musculoskeletal: Positive for back pain and myalgias.  Skin: Negative.   Neurological: Positive for dizziness.  Hematological: Negative.   Psychiatric/Behavioral: Positive for depression. The patient is nervous/anxious.     Physical Exam:  height is 5' 4"  (1.626 m). Her temporal temperature is 96.6 F (35.9 C) (abnormal). Her blood pressure is 130/71 and her pulse is 90. Her respiration is 19 and oxygen saturation is 98%.   Wt Readings from Last 3 Encounters:  12/28/19 182 lb 12.8 oz (82.9 kg)  12/26/19 179 lb (81.2 kg)  12/19/19 185 lb 9.6 oz (84.2 kg)    Physical Exam Vitals reviewed.  Constitutional:      Comments: On her breast exam, in the left breast, there is a palpable mass at about the 12 o'clock position.  This probably measures about 2 x 2 cm.  It is slightly mobile.  I cannot palpate any axillary adenopathy in the left axilla.  Right breast is unremarkable.  HENT:     Head: Normocephalic and atraumatic.  Eyes:     Pupils: Pupils are equal, round, and reactive to light.  Cardiovascular:     Rate and Rhythm: Normal rate and regular rhythm.     Heart sounds: Normal heart sounds.     Comments: Cardiac exam shows an regular rate and rhythm.  She has an occasional extra beat.  She has a 2/6 systolic ejection murmur. Pulmonary:     Effort: Pulmonary effort is normal.     Breath sounds: Normal breath sounds.  Abdominal:     General: Bowel sounds are normal.     Palpations: Abdomen is soft.  Musculoskeletal:        General: No tenderness or deformity. Normal range of motion.     Cervical back: Normal range of motion.  Lymphadenopathy:      Cervical: No cervical adenopathy.  Skin:    General: Skin is warm and dry.     Findings: No erythema or rash.  Neurological:     Mental Status: She is alert and oriented to person, place, and time.  Psychiatric:        Behavior: Behavior normal.        Thought Content: Thought content normal.        Judgment: Judgment normal.      Lab Results  Component Value Date   WBC 2.7 (L) 01/26/2020   HGB 11.7 (L) 01/26/2020   HCT  38.8 01/26/2020   MCV 86.4 01/26/2020   PLT 123 (L) 01/26/2020     Chemistry      Component Value Date/Time   NA 135 01/26/2020 1330   NA 140 08/09/2019 1024   K 5.4 (H) 01/26/2020 1330   CL 100 01/26/2020 1330   CO2 27 01/26/2020 1330   BUN 30 (H) 01/26/2020 1330   BUN 33 (H) 08/09/2019 1024   CREATININE 1.33 (H) 01/26/2020 1330   CREATININE 1.29 (H) 06/16/2016 1228      Component Value Date/Time   CALCIUM 9.5 01/26/2020 1330   ALKPHOS 124 01/26/2020 1330   AST 20 01/26/2020 1330   ALT 17 01/26/2020 1330   BILITOT 1.1 01/26/2020 1330       Impression and Plan: Ms. Vazguez is a very nice 74 year old white female.  She clearly is postmenopausal.  She is a former [employer] scientist.  She has metastatic breast cancer.  Her tumor is triple positive.  Again, I really would like to use Herceptin but her cardiac function is not that good right now.  I am sure cardiology will try their best to improve her cardiac function.  I really believe that we just have to give the Faslodex/Ibrance time to work.  I feel confident that it will work.  I just feel bad that she has all the stress at home.  We did did get a palliative care in to see her.  We just had to make sure she calls them.  This was a bit complicated.  It probably took [place] about 40 minutes to see her.  Again she has all these issues.  Most do not deal with her cancer.  We will plan to get her back in another month or so.     Volanda Napoleon, MD 6/24/20216:01 PM

## 2020-01-26 NOTE — Progress Notes (Signed)
Sat and talked with patient at length prior to her appointment. She feel like overall her pain is improving, but he had some acute pain start in her back after her last radiation appointment. She still has n/v but this week, her off week with [facility], she feels like it is better. She takes Zofran as needed. She is still having some challenges with her daughter in the home, but says it's better than before the hospitalization. She continues to have good support from her grandson.  Spoke to patient again about the palliative care referral. She again agrees that this is an important part of her care team and she will call to schedule an appointment. I will continue to follow up on this matter.

## 2020-01-27 ENCOUNTER — Encounter: Payer: Self-pay | Admitting: *Deleted

## 2020-01-27 LAB — CANCER ANTIGEN 27.29: CA 27.29: 2342.3 U/mL — ABNORMAL HIGH (ref 0.0–38.6)

## 2020-01-27 NOTE — Progress Notes (Signed)
Emailed transportation to schedule patient transportation with [facility] for her next appointment on 02/24/2020.  Oncology Nurse Navigator Documentation  Oncology Nurse Navigator Flowsheets 01/27/2020  Abnormal Finding Date -  Confirmed Diagnosis Date -  Diagnosis Status -  Phase of Treatment -  Chemotherapy Actual Start Date: -  Radiation Actual Start Date: -  Radiation Actual End Date: -  Navigator Follow Up Date: 02/24/2020  Navigator Follow Up Reason: Follow-up Appointment  Navigator Location [facility]  Navigator Encounter Type Follow-up Appt  Telephone -  Treatment Initiated Date -  Patient Visit Type MedOnc  Treatment Phase Active Tx  Barriers/Navigation Needs -  Education -  Interventions Transportation  Acuity Level 4-High Needs (Greater Than 4 Barriers Identified)  Referrals -  Coordination of Care -  Education Method -  Support Groups/Services -  Time Spent with Patient 30

## 2020-01-30 ENCOUNTER — Other Ambulatory Visit: Payer: Self-pay

## 2020-01-30 ENCOUNTER — Encounter (HOSPITAL_COMMUNITY): Payer: Self-pay

## 2020-01-30 ENCOUNTER — Ambulatory Visit (HOSPITAL_COMMUNITY)
Admission: RE | Admit: 2020-01-30 | Discharge: 2020-01-30 | Disposition: A | Discharge status: Home / Self Care | Payer: PPO | Source: Ambulatory Visit | Attending: Adult Health | Admitting: Adult Health

## 2020-01-30 VITALS — BP 132/76 | HR 94 | Ht 64.0 in | Wt 182.8 lb

## 2020-01-30 DIAGNOSIS — I2510 Atherosclerotic heart disease of native coronary artery without angina pectoris: Secondary | ICD-10-CM | POA: Diagnosis not present

## 2020-01-30 DIAGNOSIS — E875 Hyperkalemia: Secondary | ICD-10-CM | POA: Insufficient documentation

## 2020-01-30 DIAGNOSIS — Z823 Family history of stroke: Secondary | ICD-10-CM | POA: Insufficient documentation

## 2020-01-30 DIAGNOSIS — I422 Other hypertrophic cardiomyopathy: Secondary | ICD-10-CM | POA: Diagnosis not present

## 2020-01-30 DIAGNOSIS — I252 Old myocardial infarction: Secondary | ICD-10-CM | POA: Insufficient documentation

## 2020-01-30 DIAGNOSIS — Z7901 Long term (current) use of anticoagulants: Secondary | ICD-10-CM | POA: Diagnosis not present

## 2020-01-30 DIAGNOSIS — Z7984 Long term (current) use of oral hypoglycemic drugs: Secondary | ICD-10-CM | POA: Insufficient documentation

## 2020-01-30 DIAGNOSIS — Z803 Family history of malignant neoplasm of breast: Secondary | ICD-10-CM | POA: Diagnosis not present

## 2020-01-30 DIAGNOSIS — Z833 Family history of diabetes mellitus: Secondary | ICD-10-CM | POA: Diagnosis not present

## 2020-01-30 DIAGNOSIS — Z79899 Other long term (current) drug therapy: Secondary | ICD-10-CM | POA: Diagnosis not present

## 2020-01-30 DIAGNOSIS — Z8674 Personal history of sudden cardiac arrest: Secondary | ICD-10-CM | POA: Diagnosis not present

## 2020-01-30 DIAGNOSIS — Z853 Personal history of malignant neoplasm of breast: Secondary | ICD-10-CM | POA: Diagnosis not present

## 2020-01-30 DIAGNOSIS — Z87891 Personal history of nicotine dependence: Secondary | ICD-10-CM | POA: Diagnosis not present

## 2020-01-30 DIAGNOSIS — I5023 Acute on chronic systolic (congestive) heart failure: Secondary | ICD-10-CM | POA: Diagnosis not present

## 2020-01-30 DIAGNOSIS — Z951 Presence of aortocoronary bypass graft: Secondary | ICD-10-CM | POA: Insufficient documentation

## 2020-01-30 DIAGNOSIS — E119 Type 2 diabetes mellitus without complications: Secondary | ICD-10-CM | POA: Diagnosis not present

## 2020-01-30 DIAGNOSIS — I480 Paroxysmal atrial fibrillation: Secondary | ICD-10-CM | POA: Insufficient documentation

## 2020-01-30 DIAGNOSIS — I5042 Chronic combined systolic (congestive) and diastolic (congestive) heart failure: Secondary | ICD-10-CM | POA: Diagnosis not present

## 2020-01-30 DIAGNOSIS — Z923 Personal history of irradiation: Secondary | ICD-10-CM | POA: Diagnosis not present

## 2020-01-30 DIAGNOSIS — I5022 Chronic systolic (congestive) heart failure: Secondary | ICD-10-CM | POA: Diagnosis not present

## 2020-01-30 DIAGNOSIS — Z95810 Presence of automatic (implantable) cardiac defibrillator: Secondary | ICD-10-CM | POA: Insufficient documentation

## 2020-01-30 DIAGNOSIS — Z8249 Family history of ischemic heart disease and other diseases of the circulatory system: Secondary | ICD-10-CM | POA: Diagnosis not present

## 2020-01-30 DIAGNOSIS — I4819 Other persistent atrial fibrillation: Secondary | ICD-10-CM | POA: Diagnosis not present

## 2020-01-30 MED ORDER — AMIODARONE HCL 100 MG PO TABS: 100.0000 mg | ORAL_TABLET | Freq: Every day | ORAL | 6 refills | Status: DC

## 2020-01-30 NOTE — Progress Notes (Signed)
Patient ID: [name], female   DOB: Feb 07, 1946, 74 y.o.   MRN: [id number]    Advanced Heart Failure Clinic Note    Primary HF Cardiologist: Dr Haroldine Laws EP: Dr Lovena Le Oncology: Dr Marin Olp /Dr Lunette Stands    HPI: [name] is a 74 y.o. 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 Jude ICD 04/2015, & chronic diastolic CHF (EF 38-75% by echo, 45-50% by cath).   Admitted 09/17/15 with new rapid aflutter (previously PAF/VT) with plans for cardioversion. She was also noted to be volume overload in setting of non-compliance (thought to be dietary). Had TEE/DC-CV with return to NSR. She developed laryngospasm and required brief intubation but recovered in PACU post procedure. Enrolled in the ReDS vest trial,  Reading 53%. She continued to diurese with IV lasix and transitioned to lasix 40 mg twice a day. On the day of discharge Reds Vest reading was 39. Discharge weight was 160 pounds.   Admitted 11/27/16-01/08/17 with VF shocked appropriately by ICD. Underwent cath that showed severe 3 vessel disease She underwent CABG x 4, MAZE, and LAA clip. Post operatively developed AF with RVR and low output heart failure, initial co ox was 36%.Converted to NSR on amio. She was started on milrinone but unable to wean off successfully due to recurrent low output HF. Echo showed EF 45-50% with mild RV hypokinesis.  RHC performed on milrinone which showed primarily RV failure. Suspicion for amyloid raised. SPEP negative. Technetium pyrophosphate scan negative for TTR amyloid. Sent home on milrinone 0.375 mcg. Also had e. Coli UTI and treated with abx.  Home milrinone weaned off and PICC pulled in 03/2017.   Admitted 12/04/2019 with volume overload and A fib RVR. 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 Has follow  up with Dr Marin Olp.  Placed on amio drip and diuresed with IV lasix. She then underwent TEE-DC-CV. Diuresed with IV lasix. While hospitalized howed vegetation on pacer wire. Blood cultures were negative. Discharged on 12/10/19.  On 12/19/2019 she was seen by in HF clinic and she had never taken amio since discharge.    Completed radiation to her back earlier this month. .    Today she returns for HF follow up. Overall feeling terrible. Complaining of nausea and vomiting. Having a hard time walking. Says she has difficulty getting oob due to back pain. Mild shortness of breath with exertion. Denies PND/Orthopnea. Appetite poor. No fever or chills. Unable to stand and weigh due to back pain and balance issues.  She stopped taking lasix , losartan, and glucotrol due to nausea over a week ago.  Ongoing stressors at home due to her daughters recent overdose. Her grandson is able to help her some at home.   [place] 01/02/17 Done on milrinone 0.134mcg/kg/min RA = 10  RV = 40/15 PA = 42/15 (28) PCW = 16 Fick cardiac output/index = 6.1/3.3 Thermo CO/CI = 5.0/2.7 PVR = 2.4 FA sat = 96% PA sat = 63%. 64%   09/2015 TEE EF 35-40% No evidence of thrombus  09/18/2015 ECHO EF 45-50%   Labs 09/24/2015: K 4.1 Creatinine 1.33   Review of systems complete and found to be negative unless listed in HPI.    SH:  Social History   Socioeconomic History  . Marital status: Divorced    Spouse name: Not on file  . Number  of children: Not on file  . Years of education: Not on file  . Highest education level: Not on file  Occupational History  . Not on file  Tobacco Use  . Smoking status: Former Smoker    Years: 4.00    Quit date: 12/31/1980    Years since quitting: 39.1  . Smokeless tobacco: Never Used  Vaping Use  . Vaping Use: Never used  Substance and Sexual Activity  . Alcohol use: No  . Drug use: No  . Sexual activity: Not Currently  Other Topics Concern  . Not on file  Social History Narrative    Divorced.  Lives with daughter and grandson. 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   Social Determinants of Health   Financial Resource Strain:   . Difficulty of Paying Living Expenses:   Food Insecurity:   . Worried About Charity fundraiser in the Last Year:   . Arboriculturist in the Last Year:   Transportation Needs:   . Film/video editor (Medical):   [name] Lack of Transportation (Non-Medical):   Physical Activity:   . Days of Exercise per Week:   . Minutes of Exercise per Session:   Stress:   . Feeling of Stress :   Social Connections:   . Frequency of Communication with Friends and Family:   . Frequency of Social Gatherings with Friends and Family:   . Attends Religious Services:   . Active Member of Clubs or Organizations:   . Attends Archivist Meetings:   [name] Marital Status:   Intimate Partner Violence:   . Fear of Current or Ex-Partner:   . Emotionally Abused:   [name] Physically Abused:   . Sexually Abused:     FH:  Family History  Problem Relation Age of Onset  . Breast cancer Mother        dx in her 55s  . Diabetes Father   . Stroke Father   . Heart disease Sister   . Healthy Sister   . Gastric cancer Maternal Uncle     Past Medical History:  Diagnosis Date  . Abnormal PFT   . Breast cancer metastasized to bone, unspecified laterality ([place]) 12/06/2019  . CAD (coronary artery disease), native coronary artery - 3 vessel 04/21/2015   a. NSTEMI 8-04/2015 felt 2/2 demand ischemia. b. 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  . Chronic diastolic CHF (congestive heart failure) ([place])   . Diabetes mellitus ([place])   . Goals of care, counseling/discussion 12/06/2019  . Hypertrophic cardiomyopathy ([place])   . Left atrial enlargement   . Mitral regurgitation    a. Echo 04/2015: moderate mitral regurgitation. b. F/u echo 05/2015: mild MR.  . NSTEMI (non-ST elevated myocardial infarction) ([place]) 11/27/2016  .  Paroxysmal atrial fibrillation ([name])   . Tricuspid regurgitation    a. Echo 04/2015: Mod-severe TR. b. Not mentioned on echo 05/2015  . Ventricular tachycardia ([place])    a. s/p St. Jude ICD implanted 04/2015.    Current Outpatient Medications  Medication Sig Dispense Refill  . acetaminophen (TYLENOL) 500 MG tablet Take 500-1,000 mg by mouth every 6 (six) hours as needed for headache (pain).    . Blood Glucose Monitoring Suppl (TRUE METRIX METER) DEVI 1 each by Does not apply route 2 (two) times daily. 1 Device 0  . ELIQUIS 5 MG TABS tablet TAKE ONE TABLET BY MOUTH TWICE A DAY 180 tablet  3  . Magnesium Hydroxide (MILK OF MAGNESIA CONCENTRATE PO) Take by mouth every 8 (eight) hours as needed.    . ondansetron (ZOFRAN) 4 MG tablet Take 1 tablet by mouth every 4 (four) hours as needed.    [name] oxyCODONE (OXY IR/ROXICODONE) 5 MG immediate release tablet Take 1 tablet (5 mg total) by mouth every 6 (six) hours as needed for moderate pain or severe pain. 90 tablet 0  . oxyCODONE (OXYCONTIN) 20 mg 12 hr tablet Take 1 tablet (20 mg total) by mouth every 12 (twelve) hours. 60 tablet 0  . palbociclib (IBRANCE) 100 MG tablet Take 1 tablet (100 mg total) by mouth daily. Take for 21 days on, 7 days off, repeat every 28 days. 21 tablet 6  . TRUEPLUS LANCETS 28G MISC Use as instructed twice daily. 60 each 5  . amiodarone (PACERONE) 200 MG tablet Take 200 mg by mouth 2 (two) times daily. (Patient not taking: Reported on 01/30/2020)    . furosemide (LASIX) 80 MG tablet Take 1 tablet (80 mg total) by mouth daily. (Patient not taking: Reported on 01/30/2020) 30 tablet 5  . glipiZIDE (GLUCOTROL) 5 MG tablet Take 1 tablet (5 mg total) by mouth daily. (Patient not taking: Reported on 01/30/2020) 90 tablet 1  . glucose blood (TRUE METRIX BLOOD GLUCOSE TEST) test strip Use as instructed twice daily (Patient not taking: Reported on 01/30/2020) 100 each 12  . losartan (COZAAR) 25 MG tablet Take 25 mg by mouth daily. (Patient not  taking: Reported on 01/30/2020)    . ondansetron (ZOFRAN-ODT) 8 MG disintegrating tablet Take 1 tablet (8 mg total) by mouth every 8 (eight) hours as needed for nausea or vomiting. (Patient not taking: Reported on 01/30/2020) 30 tablet 0   No current facility-administered medications for this encounter.   Vitals:   01/30/20 1521  BP: 132/76  Pulse: 94  SpO2: 95%  Weight: 82.9 kg (182 lb 12.2 oz)  Height: 5\' 4"  (1.626 m)   Wt Readings from Last 3 Encounters:  01/30/20 82.9 kg (182 lb 12.2 oz)  12/28/19 82.9 kg (182 lb 12.8 oz)  12/26/19 81.2 kg (179 lb)   General:  Arrived in a wheel chair.  No resp difficulty. During our visit she started vomiting but resolved after a few minutes.  HEENT: normal Neck: supple. JVP 9-10. Carotids 2+ bilat; no bruits. No lymphadenopathy or thryomegaly appreciated. Cor: PMI nondisplaced. Irregular rate & rhythm. No rubs, gallops or murmurs. Lungs: clear Abdomen: soft, nontender, nondistended. No hepatosplenomegaly. No bruits or masses. Good bowel sounds. Extremities: no cyanosis, clubbing, rash, R and LLE 1+ edema Neuro: alert & orientedx3, cranial nerves grossly intact. moves all 4 extremities w/o difficulty. Affect pleasant   EKG: A fib 98 bpm   ASSESSMENT & PLAN: 1. 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 EF 45-50% post CABG 5/18 - She is s/p VF arrest and CABG in 4/18. Prolonged post-hospital course with low output HF despite relatively preserved EF. RHC suggestive of primarily RV failure.  - Technetium pyrophosphate scan negative for amyloid. - EF had recovered but was back down to 35-40% in May 2021 .  - NYHA III. Volume status mildly elevated. I would like for her to take lasix again but with nausea she is reluctant resume.  - Has not tolerated b-blocker well. Refuses to rechallenge.  - She stopped losartan. .    2. PAF - S/P DC-CV 09/20/15. -S/P DC-CV 12/09/19 Small Vegetation on RA lead.  -EKG today confirms she is back  in a fib.  - At  this point we should focus on rate control.  - We discussed bb but she did not want to re challenge.  - Start the lowest dose amiodarone 100 mg daily for rate control.  - Continue Eliquis for anticoagulation.   3. CAD - 3V CAD s/p CABG. Maze and LAA clipping 4/18 - No chest pain.  - No ASA with need for Eliquis. - Continue statin  4. H/O VT with ICD placed 04/2015. - s/p VF arrest 4/18 with ICD shock.  - No subsequent VT.   5. DM2  - Follows with PCP.  - Stopped glucotrol.   6. Metastatic Breast Cancer  -With multilevel Vertebral Compression Fractures.  - Has follow up with Dr Sabino Niemann Lunette Stands - Having difficulty getting oob. Offered to refer to HHPT but she declined.   7. H/O AKI Resolved. Recent BMET ok with creatinine 1.3  8. Small Vegetation on RA Pacer Lead  Blood Cx negative on 12/10/19   9. Hyperkalemia Most recent lab work K 5.4 . She is not longer taking losartan which should help.   We discussed [facility] referral but she declined.  She was open to Palliative Care but says she is not sure she will answer the phone.   Difficult situation. HF optimization limited given the above. Follow up in 3 months. Set up repeat ECHO at that time.   Darrick Grinder, NP  3:41 PM

## 2020-01-30 NOTE — Patient Instructions (Signed)
RESTART Amiodarone 100 mg, one tab daily  You have been referred to [facility] [place], [place] [address] [phone]  Your physician recommends that you schedule a follow-up appointment in: 3 months with Dr Haroldine Laws  Do the following things EVERYDAY: 1) Weigh yourself in the morning before breakfast. Write it down and keep it in a log. 2) Take your medicines as prescribed 3) Eat low salt foods--Limit salt (sodium) to 2000 mg per day.  4) Stay as active as you can everyday 5) Limit all fluids for the day to less than 2 liters At the [facility], you and your health needs are our priority. As part of our continuing mission to provide you with exceptional heart care, we have created designated Provider Care Teams. These Care Teams include your primary Cardiologist (physician) and Advanced Practice Providers (APPs- Physician Assistants and Nurse Practitioners) who all work together to provide you with the care you need, when you need it.   You may see any of the following providers on your designated Care Team at your next follow up: [name] Dr Glori Bickers . Dr Loralie Champagne . Darrick Grinder, NP . Lyda Jester, PA . Audry Riles, PharmD   Please be sure to bring in all your medications bottles to every appointment.

## 2020-02-13 ENCOUNTER — Inpatient Hospital Stay: Payer: PPO | Attending: Hematology & Oncology | Admitting: Genetic Counselor

## 2020-02-13 ENCOUNTER — Encounter: Payer: Self-pay | Admitting: Genetic Counselor

## 2020-02-13 ENCOUNTER — Other Ambulatory Visit: Payer: Self-pay

## 2020-02-13 ENCOUNTER — Telehealth: Payer: Self-pay | Admitting: Genetic Counselor

## 2020-02-13 DIAGNOSIS — C50919 Malignant neoplasm of unspecified site of unspecified female breast: Secondary | ICD-10-CM

## 2020-02-13 DIAGNOSIS — Z803 Family history of malignant neoplasm of breast: Secondary | ICD-10-CM | POA: Diagnosis not present

## 2020-02-13 DIAGNOSIS — C7951 Secondary malignant neoplasm of bone: Secondary | ICD-10-CM | POA: Diagnosis not present

## 2020-02-13 DIAGNOSIS — Z5111 Encounter for antineoplastic chemotherapy: Secondary | ICD-10-CM | POA: Insufficient documentation

## 2020-02-13 DIAGNOSIS — Z1509 Genetic susceptibility to other malignant neoplasm: Secondary | ICD-10-CM | POA: Insufficient documentation

## 2020-02-13 DIAGNOSIS — I429 Cardiomyopathy, unspecified: Secondary | ICD-10-CM | POA: Insufficient documentation

## 2020-02-13 DIAGNOSIS — Z170 Estrogen receptor positive status [ER+]: Secondary | ICD-10-CM | POA: Insufficient documentation

## 2020-02-13 DIAGNOSIS — C50912 Malignant neoplasm of unspecified site of left female breast: Secondary | ICD-10-CM | POA: Insufficient documentation

## 2020-02-13 DIAGNOSIS — Z5112 Encounter for antineoplastic immunotherapy: Secondary | ICD-10-CM | POA: Insufficient documentation

## 2020-02-13 DIAGNOSIS — I509 Heart failure, unspecified: Secondary | ICD-10-CM | POA: Insufficient documentation

## 2020-02-13 NOTE — Telephone Encounter (Signed)
Called to check on patient after missed appointment and try to r/s appointment.  Mailbox is full.

## 2020-02-13 NOTE — Progress Notes (Incomplete)
  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 She did report nausea for which she was prescribe Zofran, vomiting, moderate to severe fatigue, upper back/shoulder pain, mild difficulty swallowing, and mid abdominal pain that radiated to her back. She denied skin changes and cough. Overall, the pain in her upper thoracic spine did improve.   Plan: The patient will follow-up with radiation oncology in one month.  ________________________________________________   Blair Promise, PhD, MD  This document serves as a record of services personally performed by Gery Pray, MD. It was created on his behalf by Clerance Lav, a trained medical scribe. The creation of this record is based on the scribe's personal observations and the provider's statements to them. This document has been checked and approved by the attending provider.

## 2020-02-13 NOTE — Progress Notes (Signed)
REFERRING PROVIDER: Volanda Napoleon, MD [address] [place],  [place] [address]  PRIMARY PROVIDER:  Maurice Small, MD  PRIMARY REASON FOR VISIT:  1. Carcinoma of breast metastatic to bone, unspecified laterality ([place])   2. Family history of breast cancer   3. Lynch syndrome      HISTORY OF PRESENT ILLNESS:   [name], a 74 y.o. female, was seen for a [place] cancer genetics consultation at the request of Dr. Marin Olp due to a personal and family history of cancer.  [name] presents to clinic today to discuss the possibility of a hereditary predisposition to cancer, genetic testing, and to further clarify her future cancer risks, as well as potential cancer risks for family members.   In May 2021, at the age of 17, [name] was diagnosed with metastatic breast cancer.     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  The Common Hereditary Gene Panel offered by [facility] includes sequencing and/or deletion duplication testing of the following 48 genes: APC, ATM, AXIN2, BARD1, BMPR1A, BRCA1, BRCA2, BRIP1, CDH1, CDK4, CDKN2A (p14ARF), CDKN2A (p16INK4a), CHEK2, CTNNA1, DICER1, EPCAM (Deletion/duplication testing only), GREM1 (promoter region deletion/duplication testing only), KIT, MEN1, MLH1, MSH2, MSH3, MSH6, MUTYH, NBN, NF1, NHTL1, PALB2, PDGFRA, PMS2, POLD1, POLE, PTEN, RAD50, RAD51C, RAD51D, RNF43, SDHB, SDHC, SDHD, SMAD4, SMARCA4. STK11, TP53, TSC1, TSC2, and VHL.  The following genes were evaluated for sequence changes only: SDHA and HOXB13 c.251G>A variant only. The report date is January 09, 2020.      RISK FACTORS:  Menarche was at age 89.  First live birth at age 5.  OCP use for approximately 10+ years.  Ovaries intact: yes.   Hysterectomy: no.  Menopausal status: postmenopausal.  HRT use: 0 years. Colonoscopy: no; not examined. Mammogram within the last year: yes. Number of breast biopsies: 1. Up to date with pelvic exams: n/a. Any excessive radiation exposure in the past: no  Past Medical History:  Diagnosis Date  . Abnormal PFT   . Breast cancer metastasized to bone, unspecified laterality ([place]) 12/06/2019  . CAD (coronary artery disease), native coronary artery - 3 vessel 04/21/2015   a. NSTEMI 8-04/2015 felt 2/2 demand ischemia. b. 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  . Chronic diastolic CHF (congestive heart failure) ([place])   . Diabetes mellitus ([place])   . Family history of breast cancer   . Goals of care, counseling/discussion 12/06/2019  . Hypertrophic cardiomyopathy ([place])   . Left atrial enlargement   . Lynch syndrome   . Mitral regurgitation    a. Echo 04/2015: moderate mitral regurgitation. b. F/u echo 05/2015: mild MR.  . NSTEMI (non-ST elevated myocardial infarction) ([place]) 11/27/2016  . Paroxysmal atrial fibrillation ([name])   . Tricuspid regurgitation    a. Echo 04/2015: Mod-severe TR. b. Not mentioned on echo 05/2015  . Ventricular tachycardia ([place])    a. s/p St. Jude ICD implanted 04/2015.    Past Surgical History:  Procedure Laterality Date  . APPLICATION OF WOUND VAC Left 01/06/2013   Procedure: APPLICATION OF WOUND VAC X 2;  Surgeon: [name], MD;  [facility];  Service: Orthopedics;  Laterality: Left;  left forearm  . APPLICATION OF WOUND  VAC Left 01/09/2013   Procedure: APPLICATION OF WOUND VAC;  Surgeon: [name], MD;  [facility];  Service: Orthopedics;  Laterality: Left;  . BREAST BIOPSY Left 12/07/2019    US guided L breast mass core biopsy    . CARDIAC CATHETERIZATION N/A 04/19/2015   Procedure: Left Heart Cath and Coronary Angiography;  Surgeon: [name], MD;  Location: [facility];   Service: Cardiovascular;  Laterality: N/A;  . CARDIOVERSION N/A 09/20/2015   Procedure: CARDIOVERSION;  Surgeon: Pixie Casino, MD;  Location: [facility];  Service: Cardiovascular;  Laterality: N/A;  . CARDIOVERSION N/A 12/09/2019   Procedure: CARDIOVERSION;  Surgeon: Jolaine Artist, MD;  Location: [facility] ENDOSCOPY;  Service: Cardiovascular;  Laterality: N/A;  . CLIPPING OF ATRIAL APPENDAGE Left 12/04/2016   Procedure: CLIPPING OF ATRIAL APPENDAGE USING ATRICURE 63 ATRICLIP;  Surgeon: Melrose Nakayama, MD;  Location: [place] [facility] [place];  Service: Open Heart Surgery;  Laterality: Left;  . CORONARY ARTERY BYPASS GRAFT N/A 12/04/2016   Procedure: CORONARY ARTERY BYPASS GRAFTING (CABG)x 4 WITH ENDOSCOPIC HARVESTING OF RIGHT SAPHENOUS VEIN;  Surgeon: Melrose Nakayama, MD;  Location: [place];  Service: Open Heart Surgery;  Laterality: N/A;  . DENTAL SURGERY  Oct. 2011   several extractions, bone graft  . EP IMPLANTABLE DEVICE N/A 04/16/2015   Procedure: ICD Implant;  Surgeon: [name], MD;  Location: [place];  Service: Cardiovascular;  Laterality: N/A;  . I & D EXTREMITY Left 01/06/2013   Procedure: IRRIGATION AND DEBRIDEMENT LEFT ELBOW AND LEFT FOREARM ;  Surgeon: [name], MD;  [facility];  Service: Orthopedics;  Laterality: Left;  . INCISION AND DRAINAGE Left 01/09/2013   Procedure: REDO INCISION AND DRAINAGE LEFT ELBOW;  Surgeon: [name], MD;  [facility];  Service: Orthopedics;  Laterality: Left;  SUPINE, UPPER EXTERMITY DRAPE  . INTRAOPERATIVE TRANSESOPHAGEAL ECHOCARDIOGRAM N/A 12/04/2016   Procedure: INTRAOPERATIVE TRANSESOPHAGEAL ECHOCARDIOGRAM;  Surgeon: Melrose Nakayama, MD;  Location: [facility];  Service: Open Heart Surgery;  Laterality: N/A;  . IR THORACENTESIS ASP PLEURAL SPACE W/IMG GUIDE  12/22/2016  . IR US GUIDE BX ASP/DRAIN  12/07/2019  . LEFT HEART CATH AND CORONARY ANGIOGRAPHY N/A 11/28/2016   Procedure: Left Heart Cath and Coronary Angiography;  Surgeon:  Belva Crome, MD;  Location: [facility];  Service: Cardiovascular;  Laterality: N/A;  . MAZE N/A 12/04/2016   Procedure: MAZE PROCEDURE;  Surgeon: Melrose Nakayama, MD;  Location: [place] [facility];  Service: Open Heart Surgery;  Laterality: N/A;  . RIGHT HEART CATH N/A 01/02/2017   Procedure: Right Heart Cath;  Surgeon: Jolaine Artist, MD;  Location: [facility];  Service: Cardiovascular;  Laterality: N/A;  . SECONDARY CLOSURE OF WOUND Left 01/09/2013   Procedure: SECONDARY CLOSURE OF WOUND  LEFT ELBOW;  Surgeon: [name], MD;  [facility];  Service: Orthopedics;  Laterality: Left;  . TEE WITHOUT CARDIOVERSION N/A 09/20/2015   Procedure: TRANSESOPHAGEAL ECHOCARDIOGRAM (TEE);  Surgeon: Pixie Casino, MD;  Location: [facility];  Service: Cardiovascular;  Laterality: N/A;  . TEE WITHOUT CARDIOVERSION N/A 12/09/2019   Procedure: TRANSESOPHAGEAL ECHOCARDIOGRAM (TEE);  Surgeon: Jolaine Artist, MD;  Location: [facility]COPY;  Service: Cardiovascular;  Laterality: N/A;    Social History   Socioeconomic History  . Marital status: Divorced    Spouse name: Not on file  . Number of children: Not on file  . Years of education: Not on  file  . Highest education level: Not on file  Occupational History  . Not on file  Tobacco Use  . Smoking status: Former Smoker    Years: 4.00    Quit date: 12/31/1980    Years since quitting: 39.1  . Smokeless tobacco: Never Used  Vaping Use  . Vaping Use: Never used  Substance and Sexual Activity  . Alcohol use: No  . Drug use: No  . Sexual activity: Not Currently  Other Topics Concern  . Not on file  Social History Narrative   Divorced.  Lives with daughter and grandson. 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   Social Determinants of Health   Financial Resource Strain:   . Difficulty of Paying Living Expenses:   Food Insecurity:   . Worried About Charity fundraiser in the Last Year:   . Arboriculturist in the Last Year:     Transportation Needs:   . Film/video editor (Medical):   [name] Lack of Transportation (Non-Medical):   Physical Activity:   . Days of Exercise per Week:   . Minutes of Exercise per Session:   Stress:   . Feeling of Stress :   Social Connections:   . Frequency of Communication with Friends and Family:   . Frequency of Social Gatherings with Friends and Family:   . Attends Religious Services:   . Active Member of Clubs or Organizations:   . Attends Archivist Meetings:   [name] Marital Status:      FAMILY HISTORY:  We obtained a detailed, 4-generation family history.  Significant diagnoses are listed below: Family History  Problem Relation Age of Onset  . Breast cancer Mother 6  . Pulmonary embolism Mother   . Diabetes Father   . Stroke Father   . Heart disease Sister   . Healthy Sister   . Gastric cancer Maternal Uncle   . Prostate cancer Maternal Uncle     The patient has one daughter who is cancer free.  She has three sisters who did not have cancer.  Both parents are deceased.  The patient's father died of a stroke at 41.  He had three brothers and one sister who were cancer free.  His parents died of non-cancer related issues.  The patient's mother had breast cancer at 25 and died at 65.  She had three full sisters and a full brother, and three paternal half sisters and three paternal half brothers.  One half brother had stomach cancer in his 73's and one half brother had prostate cancer in his 16's.  The maternal grandparents are deceased.  The grandfather had a stroke and the grandmother died in childbirth.  [name] is unaware of previous family history of genetic testing for hereditary cancer risks. Patient's ancestors are of [place], [place], [place] and [place] descent. There is reported Ashkenazi Jewish ancestry. There is no known consanguinity.  GENETIC COUNSELING ASSESSMENT: [name] is a 74 y.o. female with a personal and family history of cancer  which is somewhat suggestive of a hereditary cancer syndrome and predisposition to cancer given her mother's early onset of breast cancer and the patient's metastatic status. We, therefore, discussed and recommended the following at today's visit.   DISCUSSION: We discussed that 5 - 10% of breast cancer is hereditary, with most cases associated with BRCA mutations.  There are other genes that can be associated with hereditary breast cancer syndromes.  We discussed that testing is beneficial  for several reasons including knowing how to follow individuals after completing their treatment, and understand if other family members could be at risk for cancer and allow them to undergo genetic testing.   We reviewed the characteristics, features and inheritance patterns of hereditary cancer syndromes. We also discussed genetic testing, including the appropriate family members to test, the process of testing, insurance coverage and turn-around-time for results. We discussed the implications of a negative, positive, carrier and/or variant of uncertain significant result. We recommended [name] pursue genetic testing for the common hereditary cancer gene panel. The Common Hereditary Gene Panel offered by [facility] includes sequencing and/or deletion duplication testing of the following 48 genes: APC, ATM, AXIN2, BARD1, BMPR1A, BRCA1, BRCA2, BRIP1, CDH1, CDK4, CDKN2A (p14ARF), CDKN2A (p16INK4a), CHEK2, CTNNA1, DICER1, EPCAM (Deletion/duplication testing only), GREM1 (promoter region deletion/duplication testing only), KIT, MEN1, MLH1, MSH2, MSH3, MSH6, MUTYH, NBN, NF1, NHTL1, PALB2, PDGFRA, PMS2, POLD1, POLE, PTEN, RAD50, RAD51C, RAD51D, RNF43, SDHB, SDHC, SDHD, SMAD4, SMARCA4. STK11, TP53, TSC1, TSC2, and VHL.  The following genes were evaluated for sequence changes only: SDHA and HOXB13 c.251G>A variant only.   Based on [name]'s personal and family history of cancer, she meets medical criteria for genetic  testing. Despite that she meets criteria, she may still have an out of pocket cost.   GENETIC TESTING:  At the time of [name]'s visit, we recommended she pursue genetic testing of the common hereditary cancer test. The genetic testing reported out on January 09, 2020 through the Common Hereditary Cancer Panel offered by [facility] identified a single, heterozygous pathogenic gene mutation called MSH6, c.2731C>T confirming the diagnosis of Lynch syndrome.There were no deleterious mutations in APC, ATM, AXIN2, BARD1, BMPR1A, BRCA1, BRCA2, BRIP1, CDH1, CDK4, CDKN2A, CHEK2, CTNNA1, DICER1, EPCAM, GREM1, HOXB13, KIT, MEN1, MLH1, MSH2, MSH3, MSH6, MUTYH, NBN, NF1, NTHL1, PALB2, PDGFRA, PMS2, POLD1, POLE, PTEN, RAD50, RAD51C, RAD51D, RNF43, SDHA, SDHB, SDHC, SDHD, SMAD4, SMARCA4. STK11, TP53, TSC1, TSC2, and VHL .  Genetic testing did identify a variant of uncertain significance (VUS) was identified in the PDGFRA gene called c.820A>T.  At this time, it is unknown if this variant is associated with increased cancer risk or if this is a normal finding, but most variants such as this get reclassified to being inconsequential. It should not be used to make medical management decisions. With time, we suspect the lab will determine the significance of this variant, if any. If we do learn more about it, we will try to contact [name] to discuss it further. However, it is important to stay in touch with [place] periodically and keep the address and phone number up to date.  A copy of the test report has been scanned into Epic for review.     SCREENING RECOMMENDATIONS: We discussed the implications of Lynch syndrome for [name], and discussed who else in the family should have genetic testing. We recommended [name] follow management guidelines for Lynch syndrome; all of which are outlined below. These can be coordinated by [name]'s GI doctor or her primary provider.   1. Annual colonoscopy.   2. While  there is no clear evidence to support screening for stomach and small bowel cancer, an upper endoscopy can be considered at 3-5 year intervals beginning at age 12-35. However, whether to have this screening is best determined by the gastroenterologist.   3. Annual urinalysis.   4. For patients with colorectal adenocarcinoma, segmental or extended colectomy may be considered.  For women with Lynch syndrome,  unlike the effective surveillance plan for colorectal cancer risk, there is no professional agreement regarding management for the increased risk of uterine and ovarian cancer. For endometrial cancer, women are encouraged to be aware of and immediately report dysfunctional or post-menopausal bleeding, which should then be followed up by an endometrial biopsy. In terms of surveillance, transvaginal ultrasound and endometrial biopsies have not been shown to be effective screening tools; however, they may be considered at the clinician's discretion. Importantly, transvaginal ultrasound is not recommended in pre-menopausal women due to variable presentations throughout a normal menstrual cycle. Endometrial biopsy can be considered every 1-2 years, but does not have proven benefit of reducing mortality in women with Lynch syndrome given the typically early presenting symptoms. Finally, while hysterectomy has not been shown to reduce endometrial cancer mortality, it does reduce incidence, and therefore, can be considered as a risk-reducing option.  Unfortunately, symptoms of early stage ovarian cancer are not as obvious as endometrial cancer; however, women are encouraged to be aware of symptoms, such as pelvic or abdominal pain, bloating, increased abdominal girth, difficulty eating, feeling full from eating quickly, as well as increased urinary frequency and urgency. In terms of surveillance, transvaginal ultrasound examination and serum CA-125 have not been shown to be sufficiently sensitive or specific to  support for routine screening. In terms of risk-reducing surgery, historically, women have been recommended to undergo a bilateral salpingo-oophorectomy (BSO) regardless of gene mutation; however, with the collection of gene-specific data, this recommendation has evolved. The most recent NCCN guidelines (v2.2019) notes the limited data to make specific recommendations for BSO in MSH6 and PMS2 mutation carriers. It is important to note that these guidelines do not cite the most recent article published with gene-specific risks (Dominguez-Valentin et al., 2019), which does quote a 3% ovarian cancer risk by age 69. Therefore, the decision to undergo a BSO, especially with this low-penetrance gene, should be individualized based on personal and family history.  However, we are available to help women and their providers establish an individualized surveillance plan. It is also important for women to understand the following:   1. Women should seek medical attention if they experience abnormal vaginal bleeding.  2. Some providers may still recommend vaginal ultrasounds, uterine biopsies (for uterine cancer risk) and/or CA-125 analysis ( for ovarian cancer risk), even though these have not been shown to be effective.  3. A hysterectomy with removal of the ovaries and fallopian tubes could be considered once childbearing is completed (if planned).  FAMILY MEMBERS: Since we now know the mutation in [name], we can test at-risk relatives to determine whether or not they have inherited the mutation and are at increased risk for cancer.  It is important that all of [name]'s relatives (both men and women) know of the presence of this gene mutation. Site-specific genetic testing can sort out who in the family is at risk and who is not. We will be happy to meet with any of the family members or refer them to a genetic counselor in their local area. To locate genetic counselors in other cities, individuals can  visit the website of the [employer] (ArtistMovie.se) and Secretary/administrator for a Social worker by zip code.   [name] children and siblings have a 50% chance to have inherited this mutation. We recommend they have genetic testing for this same mutation, as identifying the presence of this mutation would allow them to also take advantage of risk-reducing measures.   Children who inherit  two mutations in the same Lynch gene, one mutation from each parent, are at-risk of a rare recessive condition called constitutional mismatch repair deficiency (CMMR-D) syndrome. If family members have this mutation, they may wish to have their partner tested if they are planning on having children.  PLAN: [name]. Hinostroza will need to be followed as high risk based on her diagnosis of Lynch syndrome.    [name]. [place] does not have a gastroenterologist or GYN provider to perform follow up for the diagnosis of Lynch syndrome.  We have asked that she go back to her referring physician to discuss colonoscopy and hysterectomy and referral options.  This note will be copied to [name]'s referring physician.  RESOURCES:  [name]. Jet[place] was given patient information about Lynch syndrome that was written by the national support group "it takes guts".   We strongly encouraged [name]. Shidler to remain in contact with [place] in cancer genetics on an annual basis so we can update [name]. Darling's personal and family histories, and inform her of advances in cancer genetics that may be of benefit for the entire family. [name]. [place] knows she is also welcome to call with any questions or concerns, at any time.   Shervon Kerwin P. [place], [place], [facility]  Licensed, Insurance risk surveyor [email or web address] phone: [phone]  The patient was seen for a total of 30 minutes in face-to-face genetic counseling.

## 2020-02-14 ENCOUNTER — Telehealth: Payer: Self-pay | Admitting: Hematology & Oncology

## 2020-02-14 NOTE — Telephone Encounter (Signed)
Appointments rescheduled updated calendar mailed to patient per 7/12 CC'd Chart note

## 2020-02-15 ENCOUNTER — Encounter: Payer: PPO | Admitting: Genetic Counselor

## 2020-02-20 ENCOUNTER — Encounter: Payer: Self-pay | Admitting: *Deleted

## 2020-02-20 NOTE — Progress Notes (Signed)
Patient has appointment tomorrow. Reached out to transportation services to confirm transportation for appointment. They needed clarification on whether or not patient was still in a wheelchair.  Called and spoke to patient. She is still requiring the use of a wheelchair. She was unaware that she had an appointment tomorrow. Reviewed appointment times with her. She states she is feeling slightly better with reduced pain, but continued intermittent nausea. Will follow up with patient at [place] appointment.

## 2020-02-21 ENCOUNTER — Inpatient Hospital Stay: Payer: PPO

## 2020-02-21 ENCOUNTER — Encounter: Payer: Self-pay | Admitting: *Deleted

## 2020-02-21 ENCOUNTER — Telehealth: Payer: Self-pay | Admitting: Hematology & Oncology

## 2020-02-21 ENCOUNTER — Inpatient Hospital Stay (HOSPITAL_BASED_OUTPATIENT_CLINIC_OR_DEPARTMENT_OTHER): Payer: PPO | Admitting: Hematology & Oncology

## 2020-02-21 ENCOUNTER — Other Ambulatory Visit: Payer: Self-pay

## 2020-02-21 VITALS — BP 125/70 | HR 94 | Temp 97.8°F | Resp 18

## 2020-02-21 DIAGNOSIS — Z5111 Encounter for antineoplastic chemotherapy: Secondary | ICD-10-CM | POA: Diagnosis not present

## 2020-02-21 DIAGNOSIS — C50912 Malignant neoplasm of unspecified site of left female breast: Secondary | ICD-10-CM

## 2020-02-21 DIAGNOSIS — C7951 Secondary malignant neoplasm of bone: Secondary | ICD-10-CM

## 2020-02-21 DIAGNOSIS — Z1509 Genetic susceptibility to other malignant neoplasm: Secondary | ICD-10-CM | POA: Diagnosis not present

## 2020-02-21 DIAGNOSIS — Z170 Estrogen receptor positive status [ER+]: Secondary | ICD-10-CM | POA: Diagnosis not present

## 2020-02-21 DIAGNOSIS — Z5112 Encounter for antineoplastic immunotherapy: Secondary | ICD-10-CM | POA: Diagnosis not present

## 2020-02-21 DIAGNOSIS — I429 Cardiomyopathy, unspecified: Secondary | ICD-10-CM | POA: Diagnosis not present

## 2020-02-21 DIAGNOSIS — I509 Heart failure, unspecified: Secondary | ICD-10-CM | POA: Diagnosis not present

## 2020-02-21 LAB — CMP (CANCER CENTER ONLY)
ALT: 18 U/L (ref 0–44)
AST: 19 U/L (ref 15–41)
Albumin: 4 g/dL (ref 3.5–5.0)
Alkaline Phosphatase: 154 U/L — ABNORMAL HIGH (ref 38–126)
Anion gap: 8 (ref 5–15)
BUN: 23 mg/dL (ref 8–23)
CO2: 23 mmol/L (ref 22–32)
Calcium: 8.3 mg/dL — ABNORMAL LOW (ref 8.9–10.3)
Chloride: 106 mmol/L (ref 98–111)
Creatinine: 1.01 mg/dL — ABNORMAL HIGH (ref 0.44–1.00)
GFR, Est AFR Am: >60 mL/min (ref >60)
GFR, Estimated: 56 mL/min — ABNORMAL LOW (ref >60)
Glucose, Bld: 123 mg/dL — ABNORMAL HIGH (ref 70–99)
Potassium: 4.2 mmol/L (ref 3.5–5.1)
Sodium: 137 mmol/L (ref 135–145)
Total Bilirubin: 1.2 mg/dL (ref 0.3–1.2)
Total Protein: 6.7 g/dL (ref 6.5–8.1)

## 2020-02-21 LAB — CBC WITH DIFFERENTIAL (CANCER CENTER ONLY)
Abs Immature Granulocytes: 0.05 10*3/uL (ref 0.00–0.07)
Basophils Absolute: 0.1 10*3/uL (ref 0.0–0.1)
Basophils Relative: 2 %
Eosinophils Absolute: 0.6 10*3/uL — ABNORMAL HIGH (ref 0.0–0.5)
Eosinophils Relative: 9 %
HCT: 35.3 % — ABNORMAL LOW (ref 36.0–46.0)
Hemoglobin: 10.6 g/dL — ABNORMAL LOW (ref 12.0–15.0)
Immature Granulocytes: 1 %
Lymphocytes Relative: 11 %
Lymphs Abs: 0.7 10*3/uL (ref 0.7–4.0)
MCH: 27 pg (ref 26.0–34.0)
MCHC: 30 g/dL (ref 30.0–36.0)
MCV: 89.8 fL (ref 80.0–100.0)
Monocytes Absolute: 0.5 10*3/uL (ref 0.1–1.0)
Monocytes Relative: 8 %
Neutro Abs: 4.4 10*3/uL (ref 1.7–7.7)
Neutrophils Relative %: 69 %
Platelet Count: 340 10*3/uL (ref 150–400)
RBC: 3.93 MIL/uL (ref 3.87–5.11)
RDW: 22.9 % — ABNORMAL HIGH (ref 11.5–15.5)
WBC Count: 6.4 10*3/uL (ref 4.0–10.5)
nRBC: 0.5 % — ABNORMAL HIGH (ref 0.0–0.2)

## 2020-02-21 MED ORDER — FULVESTRANT 250 MG/5ML IM SOLN
500.0000 mg | Freq: Once | INTRAMUSCULAR | Status: AC
  Administered 2020-02-21: 500 mg via INTRAMUSCULAR

## 2020-02-21 MED ORDER — FULVESTRANT 250 MG/5ML IM SOLN
INTRAMUSCULAR | Status: AC
  Filled 2020-02-21: qty 10

## 2020-02-21 NOTE — Patient Instructions (Signed)
Fulvestrant injection What is this medicine? FULVESTRANT (ful VES trant) blocks the effects of estrogen. It is used to treat breast cancer. This medicine may be used for other purposes; ask your health care provider or pharmacist if you have questions. COMMON BRAND NAME(S): FASLODEX What should I tell my health care provider before I take this medicine? They need to know if you have any of these conditions:  bleeding disorders  liver disease  low blood counts, like low white cell, platelet, or red cell counts  an unusual or allergic reaction to fulvestrant, other medicines, foods, dyes, or preservatives  pregnant or trying to get pregnant  breast-feeding How should I use this medicine? This medicine is for injection into a muscle. It is usually given by a health care professional in a hospital or clinic setting. Talk to your pediatrician regarding the use of this medicine in children. Special care may be needed. Overdosage: If you think you have taken too much of this medicine contact a poison control center or emergency room at once. NOTE: This medicine is only for you. Do not share this medicine with others. What if I miss a dose? It is important not to miss your dose. Call your doctor or health care professional if you are unable to keep an appointment. What may interact with this medicine?  medicines that treat or prevent blood clots like warfarin, enoxaparin, dalteparin, apixaban, dabigatran, and rivaroxaban This list may not describe all possible interactions. Give your health care provider a list of all the medicines, herbs, non-prescription drugs, or dietary supplements you use. Also tell them if you smoke, drink alcohol, or use illegal drugs. Some items may interact with your medicine. What should I watch for while using this medicine? Your condition will be monitored carefully while you are receiving this medicine. You will need important blood work done while you are taking  this medicine. Do not become pregnant while taking this medicine or for at least 1 year after stopping it. Women of child-bearing potential will need to have a negative pregnancy test before starting this medicine. Women should inform their doctor if they wish to become pregnant or think they might be pregnant. There is a potential for serious side effects to an unborn child. Men should inform their doctors if they wish to father a child. This medicine may lower sperm counts. Talk to your health care professional or pharmacist for more information. Do not breast-feed an infant while taking this medicine or for 1 year after the last dose. What side effects may I notice from receiving this medicine? Side effects that you should report to your doctor or health care professional as soon as possible:  allergic reactions like skin rash, itching or hives, swelling of the face, lips, or tongue  feeling faint or lightheaded, falls  pain, tingling, numbness, or weakness in the legs  signs and symptoms of infection like fever or chills; cough; flu-like symptoms; sore throat  vaginal bleeding Side effects that usually do not require medical attention (report to your doctor or health care professional if they continue or are bothersome):  aches, pains  constipation  diarrhea  headache  hot flashes  nausea, vomiting  pain at site where injected  stomach pain This list may not describe all possible side effects. Call your doctor for medical advice about side effects. You may report side effects to FDA at 1-800-FDA-1088. Where should I keep my medicine? This drug is given in a hospital or clinic and will   not be stored at home. NOTE: This sheet is a summary. It may not cover all possible information. If you have questions about this medicine, talk to your doctor, pharmacist, or health care provider.  2020 Elsevier/Gold Standard (2017-10-29 11:34:41)  

## 2020-02-21 NOTE — Progress Notes (Signed)
Hematology and Oncology Follow Up Visit  [name] [id number] Jan 27, 1946 74 y.o. 02/21/2020   Principle Diagnosis:   Metastatic breast cancer-ER positive/HER-2 positive -bone metastasis -Lynch syndrome  Congestive heart failure  Current Therapy:    Faslodex 500 mg IM monthly -start on 12/26/2019  Ibrance 100 mg po q day (21/7) -- started in 01/2020 -- d/c on 02/05/2020  Nivolumab 480 mg IV monthly-start cycle 1 on 02/27/2020  Xgeva 120 mg subcu q. 3 months-next dose in 04/2020  Palliative radiotherapy to the spine.     Interim History:  [name] is back for her follow-up.  I think that we really caught a "break" with respect to the fact that she has Lynch syndrome.  We got her to our geneticist.  There is a strong family history of cancer.  And thankfully, we found that she does have the Lynch syndrome.  Given this fact, we are going to try her on immunotherapy.  I would think that immunotherapy should help with her metastatic disease.  So far, she has not tolerated the Ibrance.  She stopped this about 2-3 weeks ago.  Her CA 27.29 has been going up.  We saw her about a month ago, the CA 27.29 was up to 2400.  She is quite limited with respect to treatment options because of her cardiomyopathy.  We really cannot use Herceptin based therapy.  Chemotherapy we also would have a hard time using given her performance status.  Her stress at home is a little bit better.  She says her daughter is at the beach and maybe if she actually will stay at the beach.  This is caused her quite a bit of stress.  She eats okay.  She has no nausea or vomiting.  There is no issues with diarrhea.  I would have to say that overall, her performance status is by ECOG 2.    Medications:  Current Outpatient Medications:  .  acetaminophen (TYLENOL) 500 MG tablet, Take 500-1,000 mg by mouth every 6 (six) hours as needed for headache (pain)., Disp: , Rfl:  .  amiodarone (PACERONE) 100 MG tablet,  Take 1 tablet (100 mg total) by mouth daily., Disp: 30 tablet, Rfl: 6 .  Blood Glucose Monitoring Suppl (TRUE METRIX METER) DEVI, 1 each by Does not apply route 2 (two) times daily., Disp: 1 Device, Rfl: 0 .  ELIQUIS 5 MG TABS tablet, TAKE ONE TABLET BY MOUTH TWICE A DAY, Disp: 180 tablet, Rfl: 3 .  furosemide (LASIX) 80 MG tablet, Take 1 tablet (80 mg total) by mouth daily. (Patient not taking: Reported on 01/30/2020), Disp: 30 tablet, Rfl: 5 .  glipiZIDE (GLUCOTROL) 5 MG tablet, Take 1 tablet (5 mg total) by mouth daily. (Patient not taking: Reported on 01/30/2020), Disp: 90 tablet, Rfl: 1 .  glucose blood (TRUE METRIX BLOOD GLUCOSE TEST) test strip, Use as instructed twice daily (Patient not taking: Reported on 01/30/2020), Disp: 100 each, Rfl: 12 .  losartan (COZAAR) 25 MG tablet, Take 25 mg by mouth daily. (Patient not taking: Reported on 01/30/2020), Disp: , Rfl:  .  Magnesium Hydroxide (MILK OF MAGNESIA CONCENTRATE PO), Take by mouth every 8 (eight) hours as needed., Disp: , Rfl:  .  ondansetron (ZOFRAN) 4 MG tablet, Take 1 tablet by mouth every 4 (four) hours as needed., Disp: , Rfl:  .  ondansetron (ZOFRAN-ODT) 8 MG disintegrating tablet, Take 1 tablet (8 mg total) by mouth every 8 (eight) hours as needed for nausea or vomiting. (  Patient not taking: Reported on 01/30/2020), Disp: 30 tablet, Rfl: 0 .  oxyCODONE (OXY IR/ROXICODONE) 5 MG immediate release tablet, Take 1 tablet (5 mg total) by mouth every 6 (six) hours as needed for moderate pain or severe pain., Disp: 90 tablet, Rfl: 0 .  oxyCODONE (OXYCONTIN) 20 mg 12 hr tablet, Take 1 tablet (20 mg total) by mouth every 12 (twelve) hours., Disp: 60 tablet, Rfl: 0 .  palbociclib (IBRANCE) 100 MG tablet, Take 1 tablet (100 mg total) by mouth daily. Take for 21 days on, 7 days off, repeat every 28 days., Disp: 21 tablet, Rfl: 6 .  TRUEPLUS LANCETS 28G MISC, Use as instructed twice daily., Disp: 60 each, Rfl: 5 No current facility-administered  medications for this visit.  Facility-Administered Medications Ordered in Other Visits:  .  fulvestrant (FASLODEX) injection 500 mg, 500 mg, Intramuscular, Once, Dagmar Adcox, Rudell Cobb, MD  Allergies:  Allergies  Allergen Reactions  . Levaquin [Levofloxacin] Other (See Comments)    Suicidal thoughts, extreme anxiety, panic attack  . Tramadol Other (See Comments)    Fuzzy thinking and agitation per patient  . Phenergan [Promethazine Hcl] Other (See Comments)    Restless    Past Medical History, Surgical history, Social history, and Family History were reviewed and updated.  Review of Systems: Review of Systems  Constitutional: Positive for fatigue.  HENT:  Negative.   Eyes: Negative.   Respiratory: Positive for shortness of breath.   Cardiovascular: Positive for leg swelling.  Gastrointestinal: Negative.   Endocrine: Negative.   Genitourinary: Negative.    Musculoskeletal: Positive for back pain and myalgias.  Skin: Negative.   Neurological: Positive for dizziness.  Hematological: Negative.   Psychiatric/Behavioral: Positive for depression. The patient is nervous/anxious.     Physical Exam:  oral temperature is 97.8 F (36.6 C). Her blood pressure is 125/70 and her pulse is 94. Her respiration is 18 and oxygen saturation is 95%.   Wt Readings from Last 3 Encounters:  01/30/20 182 lb 12.2 oz (82.9 kg)  12/28/19 182 lb 12.8 oz (82.9 kg)  12/26/19 179 lb (81.2 kg)    Physical Exam Vitals reviewed.  Constitutional:      Comments: On her breast exam, in the left breast, there is a palpable mass at about the 12 o'clock position.  This probably measures about 2 x 2 cm.  It is slightly mobile.  I cannot palpate any axillary adenopathy in the left axilla.  Right breast is unremarkable.  HENT:     Head: Normocephalic and atraumatic.  Eyes:     Pupils: Pupils are equal, round, and reactive to light.  Cardiovascular:     Rate and Rhythm: Normal rate and regular rhythm.     Heart  sounds: Normal heart sounds.     Comments: Cardiac exam shows an regular rate and rhythm.  She has an occasional extra beat.  She has a 2/6 systolic ejection murmur. Pulmonary:     Effort: Pulmonary effort is normal.     Breath sounds: Normal breath sounds.  Abdominal:     General: Bowel sounds are normal.     Palpations: Abdomen is soft.  Musculoskeletal:        General: No tenderness or deformity. Normal range of motion.     Cervical back: Normal range of motion.  Lymphadenopathy:     Cervical: No cervical adenopathy.  Skin:    General: Skin is warm and dry.     Findings: No erythema or rash.  Neurological:  Mental Status: She is alert and oriented to person, place, and time.  Psychiatric:        Behavior: Behavior normal.        Thought Content: Thought content normal.        Judgment: Judgment normal.      Lab Results  Component Value Date   WBC 6.4 02/21/2020   HGB 10.6 (L) 02/21/2020   HCT 35.3 (L) 02/21/2020   MCV 89.8 02/21/2020   PLT 340 02/21/2020     Chemistry      Component Value Date/Time   NA 137 02/21/2020 1200   NA 140 08/09/2019 1024   K 4.2 02/21/2020 1200   CL 106 02/21/2020 1200   CO2 23 02/21/2020 1200   BUN 23 02/21/2020 1200   BUN 33 (H) 08/09/2019 1024   CREATININE 1.01 (H) 02/21/2020 1200   CREATININE 1.29 (H) 06/16/2016 1228      Component Value Date/Time   CALCIUM 8.3 (L) 02/21/2020 1200   ALKPHOS 154 (H) 02/21/2020 1200   AST 19 02/21/2020 1200   ALT 18 02/21/2020 1200   BILITOT 1.2 02/21/2020 1200       Impression and Plan: Ms. Peek is a very nice 74 year old white female.  She clearly is postmenopausal.  She is a former [employer] scientist.  She has metastatic breast cancer.  Her tumor is triple positive.  Again, she has a Lynch syndrome.  This would be a godsend for [place] as we can try immunotherapy on her.  I really hope that we get a response with immunotherapy.  We will be able to tell by the CA 27-29 going down.  I talked her  about immunotherapy.  I went over the side effects.  I told her about diarrhea.  I told her about the possibility of a rash.  There may be issues with hypothyroidism.  It also could be immune related hepatitis.  I really think it is worth the try of given her immunotherapy.  I would like to believe that she will respond.  We spent a good 40 minutes with her today.  This was relatively new information that we got on her and I want to make sure that we utilize this to try to help her treatment and see if we can make her quality of life better.  We will try to get started next week.  I will then plan to see her back a month afterwards.  She will get her Faslodex today.  This hopefully will be the last time she gets Faslodex.      Volanda Napoleon, MD 7/20/20212:09 PM

## 2020-02-21 NOTE — Progress Notes (Signed)
Visited with patient. She is doing well. Her pain is improved and her nausea is better. She does state that after her 7 day break from ibrance, she didn't restart. She feels that quality of life is more important. She still has not arrange palliative care, but states she feels better and doesn't want to proceed with the referral at this time. Her daughter has not been living with her the last few weeks and she report this to be a huge relief of stress and a much better situation for her.   After discussion with Dr Marin Olp a decision was made to proceed with immunotherapy with her positive finding for lynch. Patient appointment made for next Tuesday. Appointment also made for chemo education, and nutrition follow up. Our scheduler contacted transportation for that day.  Oncology Nurse Navigator Documentation  Oncology Nurse Navigator Flowsheets 02/21/2020  Abnormal Finding Date -  Confirmed Diagnosis Date -  Diagnosis Status -  Phase of Treatment -  Chemotherapy Actual Start Date: -  Radiation Actual Start Date: -  Radiation Actual End Date: -  Navigator Follow Up Date: 02/28/2020  Navigator Follow Up Reason: Chemotherapy  Navigator Location [facility]  Navigator Encounter Type Follow-up Appt  Telephone -  Treatment Initiated Date -  Patient Visit Type MedOnc  Treatment Phase Active Tx  Barriers/Navigation Needs Anxiety;Coordination of Care;Family Concerns;Mobility Issues;Pain;Personal Conflicts;Transportation  Education -  Interventions Coordination of Care;Psycho-Social Support;Referrals;Transportation  Acuity Level 4-High Needs (Greater Than 4 Barriers Identified)  Referrals Nutrition/dietician  Coordination of Care Appts  Education Method -  Support Groups/Services Friends and Family  Time Spent with Patient 56

## 2020-02-21 NOTE — Telephone Encounter (Signed)
Appointments scheduled calendar printed.  I have also sent email to Transportation Services in order to set up a ride for her 7/27 appt. Per 7/20 los

## 2020-02-22 LAB — CANCER ANTIGEN 27.29: CA 27.29: 1040 U/mL — ABNORMAL HIGH (ref 0.0–38.6)

## 2020-02-23 ENCOUNTER — Telehealth: Payer: Self-pay | Admitting: *Deleted

## 2020-02-23 ENCOUNTER — Ambulatory Visit
Admission: RE | Admit: 2020-02-23 | Discharge: 2020-02-23 | Disposition: A | Discharge status: Home / Self Care | Payer: PPO | Source: Ambulatory Visit | Attending: Radiation Oncology | Admitting: Radiation Oncology

## 2020-02-23 NOTE — Telephone Encounter (Signed)
CALLED PATIENT TO ASK ABOUT RESCHEDULING FU APPT. FOR 02-23-20, PATIENT AGREED TO COME ON 03-26-20 @ 10 AM

## 2020-02-24 ENCOUNTER — Other Ambulatory Visit: Payer: Self-pay | Admitting: Hematology & Oncology

## 2020-02-24 ENCOUNTER — Other Ambulatory Visit: Payer: PPO

## 2020-02-24 ENCOUNTER — Ambulatory Visit: Payer: PPO

## 2020-02-24 ENCOUNTER — Ambulatory Visit: Payer: PPO | Admitting: Family

## 2020-02-24 ENCOUNTER — Other Ambulatory Visit: Payer: Self-pay | Admitting: *Deleted

## 2020-02-24 DIAGNOSIS — Z1509 Genetic susceptibility to other malignant neoplasm: Secondary | ICD-10-CM

## 2020-02-24 NOTE — Progress Notes (Signed)
Pharmacist Chemotherapy Monitoring - Initial Assessment    Anticipated start date: 02/28/20   Regimen:  . Are orders appropriate based on the patient's diagnosis, regimen, and cycle? Yes . Does the plan date match the patient's scheduled date? Yes . Is the sequencing of drugs appropriate? Yes . Are the premedications appropriate for the patient's regimen? Yes . Prior Authorization for treatment is: Approved o If applicable, is the correct biosimilar selected based on the patient's insurance? not applicable  Organ Function and Labs: [name] Are dose adjustments needed based on the patient's renal function, hepatic function, or hematologic function? No . Are appropriate labs ordered prior to the start of patient's treatment? Yes . Other organ system assessment, if indicated: N/A . The following baseline labs, if indicated, have been ordered: nivolumab: baseline TSH +/- T4  Dose Assessment: . Are the drug doses appropriate? Yes . Are the following correct: o Drug concentrations Yes o IV fluid compatible with drug Yes o Administration routes Yes o Timing of therapy Yes . If applicable, does the patient have documented access for treatment and/or plans for port-a-cath placement? not applicable . If applicable, have lifetime cumulative doses been properly documented and assessed? not applicable Lifetime Dose Tracking  No doses have been documented on this patient for the following tracked chemicals: Doxorubicin, Epirubicin, Idarubicin, Daunorubicin, Mitoxantrone, Bleomycin, Oxaliplatin, Carboplatin, Liposomal Doxorubicin  o   Toxicity Monitoring/Prevention: . The patient has the following take home antiemetics prescribed: Ondansetron . The patient has the following take home medications prescribed: N/A . Medication allergies and previous infusion related reactions, if applicable, have been reviewed and addressed. Yes . The patient's current medication list has been assessed for drug-drug  interactions with their chemotherapy regimen. no significant drug-drug interactions were identified on review.  Order Review: . Are the treatment plan orders signed? Yes . Is the patient scheduled to see a provider prior to their treatment? No  I verify that I have reviewed each item in the above checklist and answered each question accordingly.  Sparkle Aube, Jacqlyn Larsen 02/24/2020 2:19 PM

## 2020-02-28 ENCOUNTER — Encounter: Payer: Self-pay | Admitting: *Deleted

## 2020-02-28 ENCOUNTER — Other Ambulatory Visit: Payer: Self-pay

## 2020-02-28 ENCOUNTER — Inpatient Hospital Stay: Payer: PPO

## 2020-02-28 ENCOUNTER — Other Ambulatory Visit: Payer: PPO

## 2020-02-28 ENCOUNTER — Inpatient Hospital Stay: Payer: PPO | Admitting: Nutrition

## 2020-02-28 VITALS — BP 121/80 | HR 86 | Temp 97.8°F | Resp 18

## 2020-02-28 DIAGNOSIS — Z5112 Encounter for antineoplastic immunotherapy: Secondary | ICD-10-CM | POA: Diagnosis not present

## 2020-02-28 DIAGNOSIS — Z1509 Genetic susceptibility to other malignant neoplasm: Secondary | ICD-10-CM

## 2020-02-28 MED ORDER — SODIUM CHLORIDE 0.9 % IV SOLN
Freq: Once | INTRAVENOUS | Status: AC
  Filled 2020-02-28: qty 250

## 2020-02-28 MED ORDER — SODIUM CHLORIDE 0.9 % IV SOLN
480.0000 mg | Freq: Once | INTRAVENOUS | Status: AC
  Administered 2020-02-28: 480 mg via INTRAVENOUS
  Filled 2020-02-28: qty 48

## 2020-02-28 NOTE — Progress Notes (Signed)
Ok to use [facility] from 02/21/20 for todays chemotherapy per Dr Marin Olp.

## 2020-02-28 NOTE — Patient Instructions (Signed)
Nivolumab injection What is this medicine? NIVOLUMAB (nye VOL ue mab) is a monoclonal antibody. It is used to treat colon cancer, esophageal cancer, head and neck cancer, Hodgkin lymphoma, kidney cancer, liver cancer, lung cancer, mesothelioma, melanoma, and urothelial cancer. This medicine may be used for other purposes; ask your health care provider or pharmacist if you have questions. COMMON BRAND NAME(S): Opdivo What should I tell my health care provider before I take this medicine? They need to know if you have any of these conditions:  diabetes  immune system problems  kidney disease  liver disease  lung disease  organ transplant  stomach or intestine problems  thyroid disease  an unusual or allergic reaction to nivolumab, other medicines, foods, dyes, or preservatives  pregnant or trying to get pregnant  breast-feeding How should I use this medicine? This medicine is for infusion into a vein. It is given by a health care professional in a hospital or clinic setting. A special MedGuide will be given to you before each treatment. Be sure to read this information carefully each time. Talk to your pediatrician regarding the use of this medicine in children. While this drug may be prescribed for children as young as 12 years for selected conditions, precautions do apply. Overdosage: If you think you have taken too much of this medicine contact a poison control center or emergency room at once. NOTE: This medicine is only for you. Do not share this medicine with others. What if I miss a dose? It is important not to miss your dose. Call your doctor or health care professional if you are unable to keep an appointment. What may interact with this medicine? Interactions have not been studied. Give your health care provider a list of all the medicines, herbs, non-prescription drugs, or dietary supplements you use. Also tell them if you smoke, drink alcohol, or use illegal drugs.  Some items may interact with your medicine. This list may not describe all possible interactions. Give your health care provider a list of all the medicines, herbs, non-prescription drugs, or dietary supplements you use. Also tell them if you smoke, drink alcohol, or use illegal drugs. Some items may interact with your medicine. What should I watch for while using this medicine? This drug may make you feel generally unwell. Continue your course of treatment even though you feel ill unless your doctor tells you to stop. You may need blood work done while you are taking this medicine. Do not become pregnant while taking this medicine or for 5 months after stopping it. Women should inform their doctor if they wish to become pregnant or think they might be pregnant. There is a potential for serious side effects to an unborn child. Talk to your health care professional or pharmacist for more information. Do not breast-feed an infant while taking this medicine or for 5 months after stopping it. What side effects may I notice from receiving this medicine? Side effects that you should report to your doctor or health care professional as soon as possible:  allergic reactions like skin rash, itching or hives, swelling of the face, lips, or tongue  breathing problems  blood in the urine  bloody or watery diarrhea or black, tarry stools  changes in emotions or moods  changes in vision  chest pain  cough  dizziness  feeling faint or lightheaded, falls  fever, chills  headache with fever, neck stiffness, confusion, loss of memory, sensitivity to light, hallucination, loss of contact with reality, or   seizures  joint pain  mouth sores  redness, blistering, peeling or loosening of the skin, including inside the mouth  severe muscle pain or weakness  signs and symptoms of high blood sugar such as dizziness; dry mouth; dry skin; fruity breath; nausea; stomach pain; increased hunger or thirst;  increased urination  signs and symptoms of kidney injury like trouble passing urine or change in the amount of urine  signs and symptoms of liver injury like dark yellow or brown urine; general ill feeling or flu-like symptoms; light-colored stools; loss of appetite; nausea; right upper belly pain; unusually weak or tired; yellowing of the eyes or skin  swelling of the ankles, feet, hands  trouble passing urine or change in the amount of urine  unusually weak or tired  weight gain or loss Side effects that usually do not require medical attention (report to your doctor or health care professional if they continue or are bothersome):  bone pain  constipation  decreased appetite  diarrhea  muscle pain  nausea, vomiting  tiredness This list may not describe all possible side effects. Call your doctor for medical advice about side effects. You may report side effects to FDA at 1-800-FDA-1088. Where should I keep my medicine? This drug is given in a hospital or clinic and will not be stored at home. NOTE: This sheet is a summary. It may not cover all possible information. If you have questions about this medicine, talk to your doctor, pharmacist, or health care provider.  2020 Elsevier/Gold Standard (2019-05-10 10:04:50)  

## 2020-02-28 NOTE — Progress Notes (Signed)
Received a call from patient stating that transportation has not been arranged for today. She isn't sure how to proceed.  I reached out to our transportation department and they state they never received communication to have patient transported for today. We were able to schedule transportation for slightly later today. Appointments pushed back a few hours.  Called patient and informed her that transportation was able to be worked out for later today and she should be expecting a call from them. She thanked me for follow up.   Met with patient in infusion. She is doing well today despite the transportation confusion. She reports she is still eating well and has no complaints. She will receive chemo education today prior to initiation of treatment. She is ready to start.   She reports being relieved as her grandson's genetic testing came back negative for lynch.  Due to transportation issue, she missed her nutrition follow up. We will continue to assess for need. With defer for now as patient states she is eating and gaining weight.

## 2020-02-28 NOTE — Patient Instructions (Signed)
Patient in chemotherapy education class with self.  Discussed side effects of Opdivo which include but are not limited to  fatigue, fever, allergic or infusional reaction, mucositis,  cough, SOB, altered taste, nausea and vomiting, diarrhea,  elevated LFTs myalgia and arthralgias,  rash, , delayed wound healing, mental changes (Chemo brain), increased risk of infections, thyroid dysfunction, urinary difficulties,weight loss.  Reviewed infusion room and office policy and procedure and phone numbers 24 hours x 7 days a week.  Reviewed when to call the office with any concerns or problems.  Scientist, clinical (histocompatibility and immunogenetics) given. Chemotherapy schedule reviewed. Patient verbalized understanding of immunotherapy indications and possible side effects.  Teachback done

## 2020-02-29 ENCOUNTER — Other Ambulatory Visit: Payer: Self-pay | Admitting: *Deleted

## 2020-02-29 ENCOUNTER — Encounter: Payer: Self-pay | Admitting: *Deleted

## 2020-02-29 ENCOUNTER — Other Ambulatory Visit: Payer: Self-pay | Admitting: Hematology & Oncology

## 2020-02-29 MED ORDER — OXYCODONE HCL 5 MG PO TABS: 5.0000 mg | ORAL_TABLET | Freq: Four times a day (QID) | ORAL | 0 refills | Status: AC | PRN

## 2020-02-29 NOTE — Progress Notes (Signed)
Patient calling for refill on her oxycodone. Request sent to Dr Marin Olp. Confirmed pharmacy with patient.   Spoke with patient after her first treatment yesterday. She is doing well. She states she's having slightly more pain today than usual but no other side effects. She's eating and drinking well. No nausea or other GI upset. She does have prn medication at the home if she needs it. She is instructed to call the office if she has any questions or concerns.

## 2020-03-01 ENCOUNTER — Telehealth: Payer: Self-pay | Admitting: Cardiovascular Disease

## 2020-03-01 NOTE — Telephone Encounter (Signed)
LVM for patient to return call to be have follow up scheduled with [facility] from recall list

## 2020-03-14 ENCOUNTER — Telehealth (HOSPITAL_COMMUNITY): Payer: Self-pay | Admitting: *Deleted

## 2020-03-14 ENCOUNTER — Other Ambulatory Visit: Payer: Self-pay | Admitting: Radiation Oncology

## 2020-03-14 DIAGNOSIS — C50919 Malignant neoplasm of unspecified site of unspecified female breast: Secondary | ICD-10-CM

## 2020-03-14 NOTE — Telephone Encounter (Signed)
Pt left VM stating she had fluid leaking out of her legs. I called pt back to get more information pt did not answer. Left vm requesting a return call.

## 2020-03-15 ENCOUNTER — Other Ambulatory Visit: Payer: Self-pay | Admitting: Radiation Oncology

## 2020-03-15 ENCOUNTER — Other Ambulatory Visit: Payer: Self-pay | Admitting: *Deleted

## 2020-03-15 DIAGNOSIS — C7951 Secondary malignant neoplasm of bone: Secondary | ICD-10-CM

## 2020-03-15 DIAGNOSIS — C50919 Malignant neoplasm of unspecified site of unspecified female breast: Secondary | ICD-10-CM

## 2020-03-15 MED ORDER — ONDANSETRON HCL 4 MG PO TABS: 4.0000 mg | ORAL_TABLET | ORAL | 3 refills | Status: AC | PRN

## 2020-03-15 MED ORDER — OXYCODONE HCL ER 20 MG PO T12A: 20.0000 mg | EXTENDED_RELEASE_TABLET | Freq: Two times a day (BID) | ORAL | 0 refills | Status: AC

## 2020-03-16 ENCOUNTER — Encounter: Payer: Self-pay | Admitting: *Deleted

## 2020-03-16 NOTE — Progress Notes (Signed)
Patient states she never received her Oxycontin from the pharmacy. They did deliver her Zofran. She is worried because she is having increasing pain which is affecting her ability to eat. She only has one pill left and the pharmacy is closed on the weekend.   [place]. They confirm receipt of prescription but stated they were out of the medication and had to order it. It will arrive today and will be delivered this afternoon.   Called and relayed update to patient. She was grateful for the information. Reviewed with patient how she was taking her oxycontin. She states she is taking the medication once, usually before bed, and then an oxycodone usually in the morning. Her pain is worse than usual. Educated the patient that oxycontin is more effective if taken as prescribed and she needed to start taking it twice daily. Instructed her to take BID about 12 hours apart and then use her oxycodone for breakthrough. Also instructed patient to take zofran as directed for nausea. Explained that once pain and nausea is controlled she may feel like she can eat better.  Patient also mentions weeping from both legs. She called her cardiologists office early in the week, and states they did return the call, but she wasn't able to answer it. She states she is too overwhelmed to try and call the office back to speak to the person who called. Upon review of chart, Adolm Joseph called patient. I sent a message to [facility] requesting that she call the patient today to follow up on edema. She agreed.

## 2020-03-16 NOTE — Telephone Encounter (Signed)
Received a message from pts Oncology office pt was still c/o weeping legs (see note from oncology below). Oncology office asked that we reach out to the patient. I called the patient and no answer/left vm requesting return call.   Patient also mentions weeping from both legs. She called her cardiologists office early in the week, and states they did return the call, but she wasn't able to answer it. She states she is too overwhelmed to try and call the office back to speak to the person who called. Upon review of chart, Adolm Joseph called patient. I sent a message to [facility] requesting that she call the patient today to follow up on edema. She agreed.         Electronically signed by Cordelia Poche, RN at 03/16/2020 11:25 AM

## 2020-03-17 ENCOUNTER — Encounter (HOSPITAL_COMMUNITY): Payer: Self-pay | Admitting: Emergency Medicine

## 2020-03-17 ENCOUNTER — Emergency Department (HOSPITAL_COMMUNITY): Payer: PPO

## 2020-03-17 ENCOUNTER — Other Ambulatory Visit: Payer: Self-pay

## 2020-03-17 ENCOUNTER — Inpatient Hospital Stay (HOSPITAL_COMMUNITY)
Admission: EM | Admit: 2020-03-17 | Discharge: 2020-05-04 | DRG: 871 | Disposition: E | Payer: PPO | Attending: Internal Medicine | Admitting: Internal Medicine

## 2020-03-17 DIAGNOSIS — I5043 Acute on chronic combined systolic (congestive) and diastolic (congestive) heart failure: Secondary | ICD-10-CM | POA: Diagnosis present

## 2020-03-17 DIAGNOSIS — I2510 Atherosclerotic heart disease of native coronary artery without angina pectoris: Secondary | ICD-10-CM | POA: Diagnosis present

## 2020-03-17 DIAGNOSIS — Z95810 Presence of automatic (implantable) cardiac defibrillator: Secondary | ICD-10-CM

## 2020-03-17 DIAGNOSIS — I480 Paroxysmal atrial fibrillation: Secondary | ICD-10-CM | POA: Diagnosis not present

## 2020-03-17 DIAGNOSIS — Z7189 Other specified counseling: Secondary | ICD-10-CM

## 2020-03-17 DIAGNOSIS — Z7901 Long term (current) use of anticoagulants: Secondary | ICD-10-CM

## 2020-03-17 DIAGNOSIS — C50919 Malignant neoplasm of unspecified site of unspecified female breast: Secondary | ICD-10-CM | POA: Diagnosis not present

## 2020-03-17 DIAGNOSIS — E872 Acidosis, unspecified: Secondary | ICD-10-CM | POA: Diagnosis present

## 2020-03-17 DIAGNOSIS — C7951 Secondary malignant neoplasm of bone: Secondary | ICD-10-CM | POA: Diagnosis present

## 2020-03-17 DIAGNOSIS — R609 Edema, unspecified: Secondary | ICD-10-CM | POA: Diagnosis not present

## 2020-03-17 DIAGNOSIS — C50912 Malignant neoplasm of unspecified site of left female breast: Secondary | ICD-10-CM | POA: Diagnosis present

## 2020-03-17 DIAGNOSIS — E860 Dehydration: Secondary | ICD-10-CM | POA: Diagnosis present

## 2020-03-17 DIAGNOSIS — Z515 Encounter for palliative care: Secondary | ICD-10-CM

## 2020-03-17 DIAGNOSIS — E876 Hypokalemia: Secondary | ICD-10-CM | POA: Diagnosis not present

## 2020-03-17 DIAGNOSIS — R0600 Dyspnea, unspecified: Secondary | ICD-10-CM | POA: Diagnosis not present

## 2020-03-17 DIAGNOSIS — N179 Acute kidney failure, unspecified: Secondary | ICD-10-CM

## 2020-03-17 DIAGNOSIS — I255 Ischemic cardiomyopathy: Secondary | ICD-10-CM | POA: Diagnosis present

## 2020-03-17 DIAGNOSIS — I5082 Biventricular heart failure: Secondary | ICD-10-CM | POA: Diagnosis present

## 2020-03-17 DIAGNOSIS — Z7984 Long term (current) use of oral hypoglycemic drugs: Secondary | ICD-10-CM

## 2020-03-17 DIAGNOSIS — G893 Neoplasm related pain (acute) (chronic): Secondary | ICD-10-CM | POA: Diagnosis present

## 2020-03-17 DIAGNOSIS — N170 Acute kidney failure with tubular necrosis: Secondary | ICD-10-CM | POA: Diagnosis present

## 2020-03-17 DIAGNOSIS — N1831 Chronic kidney disease, stage 3a: Secondary | ICD-10-CM | POA: Diagnosis present

## 2020-03-17 DIAGNOSIS — Z79891 Long term (current) use of opiate analgesic: Secondary | ICD-10-CM

## 2020-03-17 DIAGNOSIS — G4700 Insomnia, unspecified: Secondary | ICD-10-CM | POA: Diagnosis present

## 2020-03-17 DIAGNOSIS — N8110 Cystocele, unspecified: Secondary | ICD-10-CM | POA: Diagnosis present

## 2020-03-17 DIAGNOSIS — C50911 Malignant neoplasm of unspecified site of right female breast: Secondary | ICD-10-CM | POA: Diagnosis not present

## 2020-03-17 DIAGNOSIS — E11649 Type 2 diabetes mellitus with hypoglycemia without coma: Secondary | ICD-10-CM | POA: Diagnosis not present

## 2020-03-17 DIAGNOSIS — E1122 Type 2 diabetes mellitus with diabetic chronic kidney disease: Secondary | ICD-10-CM | POA: Diagnosis present

## 2020-03-17 DIAGNOSIS — I130 Hypertensive heart and chronic kidney disease with heart failure and stage 1 through stage 4 chronic kidney disease, or unspecified chronic kidney disease: Secondary | ICD-10-CM | POA: Diagnosis present

## 2020-03-17 DIAGNOSIS — I4819 Other persistent atrial fibrillation: Secondary | ICD-10-CM | POA: Diagnosis not present

## 2020-03-17 DIAGNOSIS — D500 Iron deficiency anemia secondary to blood loss (chronic): Secondary | ICD-10-CM | POA: Diagnosis not present

## 2020-03-17 DIAGNOSIS — Z20822 Contact with and (suspected) exposure to covid-19: Secondary | ICD-10-CM | POA: Diagnosis present

## 2020-03-17 DIAGNOSIS — E875 Hyperkalemia: Secondary | ICD-10-CM | POA: Diagnosis present

## 2020-03-17 DIAGNOSIS — Z79899 Other long term (current) drug therapy: Secondary | ICD-10-CM

## 2020-03-17 DIAGNOSIS — I10 Essential (primary) hypertension: Secondary | ICD-10-CM | POA: Diagnosis not present

## 2020-03-17 DIAGNOSIS — I429 Cardiomyopathy, unspecified: Secondary | ICD-10-CM

## 2020-03-17 DIAGNOSIS — E871 Hypo-osmolality and hyponatremia: Secondary | ICD-10-CM | POA: Diagnosis present

## 2020-03-17 DIAGNOSIS — A419 Sepsis, unspecified organism: Principal | ICD-10-CM | POA: Diagnosis present

## 2020-03-17 DIAGNOSIS — Z66 Do not resuscitate: Secondary | ICD-10-CM | POA: Diagnosis present

## 2020-03-17 DIAGNOSIS — M5489 Other dorsalgia: Secondary | ICD-10-CM | POA: Diagnosis not present

## 2020-03-17 DIAGNOSIS — E440 Moderate protein-calorie malnutrition: Secondary | ICD-10-CM | POA: Insufficient documentation

## 2020-03-17 DIAGNOSIS — D0591 Unspecified type of carcinoma in situ of right breast: Secondary | ICD-10-CM | POA: Diagnosis not present

## 2020-03-17 DIAGNOSIS — D8481 Immunodeficiency due to conditions classified elsewhere: Secondary | ICD-10-CM | POA: Diagnosis present

## 2020-03-17 DIAGNOSIS — I5030 Unspecified diastolic (congestive) heart failure: Secondary | ICD-10-CM

## 2020-03-17 DIAGNOSIS — M8458XA Pathological fracture in neoplastic disease, other specified site, initial encounter for fracture: Secondary | ICD-10-CM | POA: Diagnosis present

## 2020-03-17 DIAGNOSIS — I5022 Chronic systolic (congestive) heart failure: Secondary | ICD-10-CM | POA: Diagnosis not present

## 2020-03-17 DIAGNOSIS — Z87891 Personal history of nicotine dependence: Secondary | ICD-10-CM

## 2020-03-17 DIAGNOSIS — F329 Major depressive disorder, single episode, unspecified: Secondary | ICD-10-CM

## 2020-03-17 DIAGNOSIS — R570 Cardiogenic shock: Secondary | ICD-10-CM | POA: Diagnosis not present

## 2020-03-17 DIAGNOSIS — Z452 Encounter for adjustment and management of vascular access device: Secondary | ICD-10-CM

## 2020-03-17 DIAGNOSIS — E559 Vitamin D deficiency, unspecified: Secondary | ICD-10-CM | POA: Diagnosis present

## 2020-03-17 DIAGNOSIS — L03116 Cellulitis of left lower limb: Secondary | ICD-10-CM | POA: Diagnosis present

## 2020-03-17 DIAGNOSIS — Z6829 Body mass index (BMI) 29.0-29.9, adult: Secondary | ICD-10-CM

## 2020-03-17 DIAGNOSIS — I422 Other hypertrophic cardiomyopathy: Secondary | ICD-10-CM | POA: Diagnosis present

## 2020-03-17 DIAGNOSIS — I5084 End stage heart failure: Secondary | ICD-10-CM | POA: Diagnosis present

## 2020-03-17 DIAGNOSIS — I4891 Unspecified atrial fibrillation: Secondary | ICD-10-CM | POA: Diagnosis not present

## 2020-03-17 DIAGNOSIS — F411 Generalized anxiety disorder: Secondary | ICD-10-CM

## 2020-03-17 DIAGNOSIS — Z951 Presence of aortocoronary bypass graft: Secondary | ICD-10-CM

## 2020-03-17 DIAGNOSIS — I5042 Chronic combined systolic (congestive) and diastolic (congestive) heart failure: Secondary | ICD-10-CM | POA: Diagnosis not present

## 2020-03-17 DIAGNOSIS — I509 Heart failure, unspecified: Secondary | ICD-10-CM | POA: Diagnosis not present

## 2020-03-17 DIAGNOSIS — L03115 Cellulitis of right lower limb: Secondary | ICD-10-CM | POA: Diagnosis present

## 2020-03-17 DIAGNOSIS — L0390 Cellulitis, unspecified: Secondary | ICD-10-CM | POA: Diagnosis present

## 2020-03-17 DIAGNOSIS — I499 Cardiac arrhythmia, unspecified: Secondary | ICD-10-CM | POA: Diagnosis not present

## 2020-03-17 DIAGNOSIS — I252 Old myocardial infarction: Secondary | ICD-10-CM

## 2020-03-17 DIAGNOSIS — I5023 Acute on chronic systolic (congestive) heart failure: Secondary | ICD-10-CM | POA: Diagnosis not present

## 2020-03-17 DIAGNOSIS — R079 Chest pain, unspecified: Secondary | ICD-10-CM | POA: Diagnosis not present

## 2020-03-17 DIAGNOSIS — D0592 Unspecified type of carcinoma in situ of left breast: Secondary | ICD-10-CM | POA: Diagnosis not present

## 2020-03-17 DIAGNOSIS — E785 Hyperlipidemia, unspecified: Secondary | ICD-10-CM | POA: Diagnosis present

## 2020-03-17 DIAGNOSIS — Z803 Family history of malignant neoplasm of breast: Secondary | ICD-10-CM

## 2020-03-17 DIAGNOSIS — R0789 Other chest pain: Secondary | ICD-10-CM | POA: Diagnosis not present

## 2020-03-17 DIAGNOSIS — R32 Unspecified urinary incontinence: Secondary | ICD-10-CM | POA: Diagnosis present

## 2020-03-17 LAB — I-STAT CHEM 8, ED
BUN: 55 mg/dL — ABNORMAL HIGH (ref 8–23)
Calcium, Ion: 0.7 mmol/L — CL (ref 1.15–1.40)
Chloride: 98 mmol/L (ref 98–111)
Creatinine, Ser: 2.2 mg/dL — ABNORMAL HIGH (ref 0.44–1.00)
Glucose, Bld: 138 mg/dL — ABNORMAL HIGH (ref 70–99)
HCT: 44 % (ref 36.0–46.0)
Hemoglobin: 15 g/dL (ref 12.0–15.0)
Potassium: 5 mmol/L (ref 3.5–5.1)
Sodium: 130 mmol/L — ABNORMAL LOW (ref 135–145)
TCO2: 19 mmol/L — ABNORMAL LOW (ref 22–32)

## 2020-03-17 LAB — CBC
HCT: 40.5 % (ref 36.0–46.0)
Hemoglobin: 11.9 g/dL — ABNORMAL LOW (ref 12.0–15.0)
MCH: 26.3 pg (ref 26.0–34.0)
MCHC: 29.4 g/dL — ABNORMAL LOW (ref 30.0–36.0)
MCV: 89.6 fL (ref 80.0–100.0)
Platelets: 279 10*3/uL (ref 150–400)
RBC: 4.52 MIL/uL (ref 3.87–5.11)
RDW: 22.7 % — ABNORMAL HIGH (ref 11.5–15.5)
WBC: 24.5 10*3/uL — ABNORMAL HIGH (ref 4.0–10.5)
nRBC: 0.3 % — ABNORMAL HIGH (ref 0.0–0.2)

## 2020-03-17 LAB — HEPATIC FUNCTION PANEL
ALT: 22 U/L (ref 0–44)
AST: 38 U/L (ref 15–41)
Albumin: 3 g/dL — ABNORMAL LOW (ref 3.5–5.0)
Alkaline Phosphatase: 152 U/L — ABNORMAL HIGH (ref 38–126)
Bilirubin, Direct: 1 mg/dL — ABNORMAL HIGH (ref 0.0–0.2)
Indirect Bilirubin: 1.7 mg/dL — ABNORMAL HIGH (ref 0.3–0.9)
Total Bilirubin: 2.7 mg/dL — ABNORMAL HIGH (ref 0.3–1.2)
Total Protein: 6.9 g/dL (ref 6.5–8.1)

## 2020-03-17 LAB — BASIC METABOLIC PANEL
Anion gap: 15 (ref 5–15)
BUN: 55 mg/dL — ABNORMAL HIGH (ref 8–23)
CO2: 15 mmol/L — ABNORMAL LOW (ref 22–32)
Calcium: 6.2 mg/dL — CL (ref 8.9–10.3)
Chloride: 99 mmol/L (ref 98–111)
Creatinine, Ser: 2.03 mg/dL — ABNORMAL HIGH (ref 0.44–1.00)
GFR calc Af Amer: 28 mL/min — ABNORMAL LOW (ref >60)
GFR calc non Af Amer: 24 mL/min — ABNORMAL LOW (ref >60)
Glucose, Bld: 184 mg/dL — ABNORMAL HIGH (ref 70–99)
Potassium: 5.4 mmol/L — ABNORMAL HIGH (ref 3.5–5.1)
Sodium: 129 mmol/L — ABNORMAL LOW (ref 135–145)

## 2020-03-17 LAB — TROPONIN I (HIGH SENSITIVITY)
Troponin I (High Sensitivity): 22 ng/L — ABNORMAL HIGH (ref <18)
Troponin I (High Sensitivity): 24 ng/L — ABNORMAL HIGH (ref <18)

## 2020-03-17 NOTE — ED Notes (Signed)
Dr Billy Fischer aware Ca 6.2

## 2020-03-17 NOTE — ED Triage Notes (Signed)
Pt to triage via [facility] from home.  Reports chest pain and increased BLE edema.  Pt's Lasix was increased 1 month ago without improvement.  Reports exertional SOB. 20g LFA.[name]

## 2020-03-18 ENCOUNTER — Encounter (HOSPITAL_COMMUNITY): Payer: Self-pay | Admitting: Internal Medicine

## 2020-03-18 DIAGNOSIS — I4891 Unspecified atrial fibrillation: Secondary | ICD-10-CM | POA: Diagnosis not present

## 2020-03-18 DIAGNOSIS — L03116 Cellulitis of left lower limb: Secondary | ICD-10-CM | POA: Diagnosis present

## 2020-03-18 DIAGNOSIS — I5023 Acute on chronic systolic (congestive) heart failure: Secondary | ICD-10-CM | POA: Diagnosis not present

## 2020-03-18 DIAGNOSIS — I5022 Chronic systolic (congestive) heart failure: Secondary | ICD-10-CM | POA: Diagnosis not present

## 2020-03-18 DIAGNOSIS — Z66 Do not resuscitate: Secondary | ICD-10-CM | POA: Diagnosis present

## 2020-03-18 DIAGNOSIS — E11649 Type 2 diabetes mellitus with hypoglycemia without coma: Secondary | ICD-10-CM | POA: Diagnosis not present

## 2020-03-18 DIAGNOSIS — R609 Edema, unspecified: Secondary | ICD-10-CM | POA: Diagnosis not present

## 2020-03-18 DIAGNOSIS — I5084 End stage heart failure: Secondary | ICD-10-CM | POA: Diagnosis present

## 2020-03-18 DIAGNOSIS — I480 Paroxysmal atrial fibrillation: Secondary | ICD-10-CM

## 2020-03-18 DIAGNOSIS — I5030 Unspecified diastolic (congestive) heart failure: Secondary | ICD-10-CM | POA: Diagnosis not present

## 2020-03-18 DIAGNOSIS — I130 Hypertensive heart and chronic kidney disease with heart failure and stage 1 through stage 4 chronic kidney disease, or unspecified chronic kidney disease: Secondary | ICD-10-CM | POA: Diagnosis present

## 2020-03-18 DIAGNOSIS — E872 Acidosis, unspecified: Secondary | ICD-10-CM | POA: Diagnosis present

## 2020-03-18 DIAGNOSIS — I5042 Chronic combined systolic (congestive) and diastolic (congestive) heart failure: Secondary | ICD-10-CM | POA: Diagnosis not present

## 2020-03-18 DIAGNOSIS — I5043 Acute on chronic combined systolic (congestive) and diastolic (congestive) heart failure: Secondary | ICD-10-CM | POA: Diagnosis present

## 2020-03-18 DIAGNOSIS — N170 Acute kidney failure with tubular necrosis: Secondary | ICD-10-CM | POA: Diagnosis present

## 2020-03-18 DIAGNOSIS — D500 Iron deficiency anemia secondary to blood loss (chronic): Secondary | ICD-10-CM | POA: Diagnosis not present

## 2020-03-18 DIAGNOSIS — I10 Essential (primary) hypertension: Secondary | ICD-10-CM | POA: Diagnosis not present

## 2020-03-18 DIAGNOSIS — E785 Hyperlipidemia, unspecified: Secondary | ICD-10-CM | POA: Diagnosis present

## 2020-03-18 DIAGNOSIS — Z20822 Contact with and (suspected) exposure to covid-19: Secondary | ICD-10-CM | POA: Diagnosis present

## 2020-03-18 DIAGNOSIS — L03115 Cellulitis of right lower limb: Secondary | ICD-10-CM | POA: Diagnosis present

## 2020-03-18 DIAGNOSIS — F411 Generalized anxiety disorder: Secondary | ICD-10-CM | POA: Diagnosis not present

## 2020-03-18 DIAGNOSIS — C7951 Secondary malignant neoplasm of bone: Secondary | ICD-10-CM | POA: Diagnosis present

## 2020-03-18 DIAGNOSIS — I4819 Other persistent atrial fibrillation: Secondary | ICD-10-CM | POA: Diagnosis not present

## 2020-03-18 DIAGNOSIS — E860 Dehydration: Secondary | ICD-10-CM

## 2020-03-18 DIAGNOSIS — I509 Heart failure, unspecified: Secondary | ICD-10-CM | POA: Diagnosis not present

## 2020-03-18 DIAGNOSIS — F329 Major depressive disorder, single episode, unspecified: Secondary | ICD-10-CM | POA: Diagnosis not present

## 2020-03-18 DIAGNOSIS — M8458XA Pathological fracture in neoplastic disease, other specified site, initial encounter for fracture: Secondary | ICD-10-CM | POA: Diagnosis present

## 2020-03-18 DIAGNOSIS — E875 Hyperkalemia: Secondary | ICD-10-CM | POA: Diagnosis present

## 2020-03-18 DIAGNOSIS — A419 Sepsis, unspecified organism: Secondary | ICD-10-CM | POA: Diagnosis present

## 2020-03-18 DIAGNOSIS — L0390 Cellulitis, unspecified: Secondary | ICD-10-CM | POA: Diagnosis not present

## 2020-03-18 DIAGNOSIS — C50912 Malignant neoplasm of unspecified site of left female breast: Secondary | ICD-10-CM | POA: Diagnosis present

## 2020-03-18 DIAGNOSIS — Z515 Encounter for palliative care: Secondary | ICD-10-CM | POA: Diagnosis not present

## 2020-03-18 DIAGNOSIS — E871 Hypo-osmolality and hyponatremia: Secondary | ICD-10-CM

## 2020-03-18 DIAGNOSIS — I422 Other hypertrophic cardiomyopathy: Secondary | ICD-10-CM | POA: Diagnosis present

## 2020-03-18 DIAGNOSIS — C50919 Malignant neoplasm of unspecified site of unspecified female breast: Secondary | ICD-10-CM | POA: Diagnosis not present

## 2020-03-18 DIAGNOSIS — N179 Acute kidney failure, unspecified: Secondary | ICD-10-CM | POA: Diagnosis not present

## 2020-03-18 DIAGNOSIS — D8481 Immunodeficiency due to conditions classified elsewhere: Secondary | ICD-10-CM | POA: Diagnosis present

## 2020-03-18 DIAGNOSIS — N1831 Chronic kidney disease, stage 3a: Secondary | ICD-10-CM | POA: Diagnosis present

## 2020-03-18 DIAGNOSIS — C50911 Malignant neoplasm of unspecified site of right female breast: Secondary | ICD-10-CM | POA: Diagnosis not present

## 2020-03-18 DIAGNOSIS — E440 Moderate protein-calorie malnutrition: Secondary | ICD-10-CM | POA: Diagnosis present

## 2020-03-18 DIAGNOSIS — Z7189 Other specified counseling: Secondary | ICD-10-CM | POA: Diagnosis not present

## 2020-03-18 LAB — CBC WITH DIFFERENTIAL/PLATELET
Abs Immature Granulocytes: 0.26 10*3/uL — ABNORMAL HIGH (ref 0.00–0.07)
Basophils Absolute: 0.1 10*3/uL (ref 0.0–0.1)
Basophils Relative: 0 %
Eosinophils Absolute: 0 10*3/uL (ref 0.0–0.5)
Eosinophils Relative: 0 %
HCT: 39.3 % (ref 36.0–46.0)
Hemoglobin: 12 g/dL (ref 12.0–15.0)
Immature Granulocytes: 1 %
Lymphocytes Relative: 2 %
Lymphs Abs: 0.5 10*3/uL — ABNORMAL LOW (ref 0.7–4.0)
MCH: 27 pg (ref 26.0–34.0)
MCHC: 30.5 g/dL (ref 30.0–36.0)
MCV: 88.5 fL (ref 80.0–100.0)
Monocytes Absolute: 0.7 10*3/uL (ref 0.1–1.0)
Monocytes Relative: 3 %
Neutro Abs: 21.4 10*3/uL — ABNORMAL HIGH (ref 1.7–7.7)
Neutrophils Relative %: 94 %
Platelets: 290 10*3/uL (ref 150–400)
RBC: 4.44 MIL/uL (ref 3.87–5.11)
RDW: 22.7 % — ABNORMAL HIGH (ref 11.5–15.5)
WBC: 23 10*3/uL — ABNORMAL HIGH (ref 4.0–10.5)
nRBC: 0.3 % — ABNORMAL HIGH (ref 0.0–0.2)

## 2020-03-18 LAB — HEMOGLOBIN A1C
Hgb A1c MFr Bld: 7.2 % — ABNORMAL HIGH (ref 4.8–5.6)
Mean Plasma Glucose: 159.94 mg/dL

## 2020-03-18 LAB — URINALYSIS, ROUTINE W REFLEX MICROSCOPIC
Bilirubin Urine: NEGATIVE
Glucose, UA: NEGATIVE mg/dL
Ketones, ur: NEGATIVE mg/dL
Nitrite: NEGATIVE
Protein, ur: 30 mg/dL — AB
Specific Gravity, Urine: 1.015 (ref 1.005–1.030)
pH: 5 (ref 5.0–8.0)

## 2020-03-18 LAB — I-STAT VENOUS BLOOD GAS, ED
Acid-base deficit: 7 mmol/L — ABNORMAL HIGH (ref 0.0–2.0)
Bicarbonate: 20.2 mmol/L (ref 20.0–28.0)
Calcium, Ion: 0.73 mmol/L — CL (ref 1.15–1.40)
HCT: 44 % (ref 36.0–46.0)
Hemoglobin: 15 g/dL (ref 12.0–15.0)
O2 Saturation: 97 %
Potassium: 5.1 mmol/L (ref 3.5–5.1)
Sodium: 131 mmol/L — ABNORMAL LOW (ref 135–145)
TCO2: 22 mmol/L (ref 22–32)
pCO2, Ven: 46.3 mmHg (ref 44.0–60.0)
pH, Ven: 7.248 — ABNORMAL LOW (ref 7.250–7.430)
pO2, Ven: 110 mmHg — ABNORMAL HIGH (ref 32.0–45.0)

## 2020-03-18 LAB — GLUCOSE, CAPILLARY
Glucose-Capillary: 113 mg/dL — ABNORMAL HIGH (ref 70–99)
Glucose-Capillary: 156 mg/dL — ABNORMAL HIGH (ref 70–99)
Glucose-Capillary: 101 mg/dL — ABNORMAL HIGH (ref 70–99)
Glucose-Capillary: 87 mg/dL (ref 70–99)

## 2020-03-18 LAB — LACTIC ACID, PLASMA
Lactic Acid, Venous: 2.2 mmol/L (ref 0.5–1.9)
Lactic Acid, Venous: 2.2 mmol/L (ref 0.5–1.9)

## 2020-03-18 LAB — COMPREHENSIVE METABOLIC PANEL
ALT: 24 U/L (ref 0–44)
AST: 24 U/L (ref 15–41)
Albumin: 2.9 g/dL — ABNORMAL LOW (ref 3.5–5.0)
Alkaline Phosphatase: 154 U/L — ABNORMAL HIGH (ref 38–126)
Anion gap: 14 (ref 5–15)
BUN: 56 mg/dL — ABNORMAL HIGH (ref 8–23)
CO2: 18 mmol/L — ABNORMAL LOW (ref 22–32)
Calcium: 6.1 mg/dL — CL (ref 8.9–10.3)
Chloride: 98 mmol/L (ref 98–111)
Creatinine, Ser: 2.08 mg/dL — ABNORMAL HIGH (ref 0.44–1.00)
GFR calc Af Amer: 27 mL/min — ABNORMAL LOW (ref >60)
GFR calc non Af Amer: 23 mL/min — ABNORMAL LOW (ref >60)
Glucose, Bld: 139 mg/dL — ABNORMAL HIGH (ref 70–99)
Potassium: 5.1 mmol/L (ref 3.5–5.1)
Sodium: 130 mmol/L — ABNORMAL LOW (ref 135–145)
Total Bilirubin: 2 mg/dL — ABNORMAL HIGH (ref 0.3–1.2)
Total Protein: 6.5 g/dL (ref 6.5–8.1)

## 2020-03-18 LAB — PROCALCITONIN: Procalcitonin: 0.59 ng/mL

## 2020-03-18 LAB — CALCIUM, IONIZED: Calcium, Ionized, Serum: 3.1 mg/dL — ABNORMAL LOW (ref 4.5–5.6)

## 2020-03-18 LAB — MAGNESIUM: Magnesium: 1.9 mg/dL (ref 1.7–2.4)

## 2020-03-18 LAB — BRAIN NATRIURETIC PEPTIDE: B Natriuretic Peptide: 912 pg/mL — ABNORMAL HIGH (ref 0.0–100.0)

## 2020-03-18 LAB — PROTIME-INR
INR: 2.5 — ABNORMAL HIGH (ref 0.8–1.2)
Prothrombin Time: 26.5 seconds — ABNORMAL HIGH (ref 11.4–15.2)

## 2020-03-18 LAB — SARS CORONAVIRUS 2 BY RT PCR (HOSPITAL ORDER, PERFORMED IN ~~LOC~~ HOSPITAL LAB): SARS Coronavirus 2: NEGATIVE

## 2020-03-18 LAB — MRSA PCR SCREENING: MRSA by PCR: NEGATIVE

## 2020-03-18 LAB — CBG MONITORING, ED: Glucose-Capillary: 150 mg/dL — ABNORMAL HIGH (ref 70–99)

## 2020-03-18 LAB — CORTISOL-AM, BLOOD: Cortisol - AM: 39.3 ug/dL — ABNORMAL HIGH (ref 6.7–22.6)

## 2020-03-18 MED ORDER — PIPERACILLIN-TAZOBACTAM 3.375 G IVPB 30 MIN
3.3750 g | Freq: Once | INTRAVENOUS | Status: DC
  Filled 2020-03-18: qty 50

## 2020-03-18 MED ORDER — OXYCODONE HCL 5 MG PO TABS
5.0000 mg | ORAL_TABLET | Freq: Four times a day (QID) | ORAL | Status: DC | PRN
  Administered 2020-03-18: 5 mg via ORAL
  Filled 2020-03-18: qty 1

## 2020-03-18 MED ORDER — VANCOMYCIN HCL 1250 MG/250ML IV SOLN
1250.0000 mg | INTRAVENOUS | Status: DC
  Administered 2020-03-20: 1250 mg via INTRAVENOUS
  Filled 2020-03-18: qty 250

## 2020-03-18 MED ORDER — SODIUM CHLORIDE 0.9 % IV SOLN
8.0000 mg | Freq: Three times a day (TID) | INTRAVENOUS | Status: DC | PRN
  Filled 2020-03-18: qty 4

## 2020-03-18 MED ORDER — VANCOMYCIN HCL 1500 MG/300ML IV SOLN
1500.0000 mg | Freq: Once | INTRAVENOUS | Status: AC
  Administered 2020-03-18: 1500 mg via INTRAVENOUS
  Filled 2020-03-18 (×2): qty 300

## 2020-03-18 MED ORDER — LACTATED RINGERS IV BOLUS: 1000.0000 mL | Freq: Once | INTRAVENOUS | Status: DC

## 2020-03-18 MED ORDER — LACTATED RINGERS IV SOLN: INTRAVENOUS | Status: AC

## 2020-03-18 MED ORDER — LACTATED RINGERS IV BOLUS: 500.0000 mL | Freq: Once | INTRAVENOUS | Status: DC

## 2020-03-18 MED ORDER — ACETAMINOPHEN 325 MG PO TABS
650.0000 mg | ORAL_TABLET | Freq: Four times a day (QID) | ORAL | Status: DC | PRN
  Administered 2020-03-18 – 2020-03-31 (×2): 650 mg via ORAL
  Filled 2020-03-18 (×2): qty 2

## 2020-03-18 MED ORDER — APIXABAN 5 MG PO TABS
5.0000 mg | ORAL_TABLET | Freq: Two times a day (BID) | ORAL | Status: DC
  Administered 2020-03-18 – 2020-03-28 (×22): 5 mg via ORAL
  Filled 2020-03-18 (×23): qty 1

## 2020-03-18 MED ORDER — CALCIUM GLUCONATE-NACL 2-0.675 GM/100ML-% IV SOLN
2.0000 g | Freq: Once | INTRAVENOUS | Status: AC
  Administered 2020-03-18: 2000 mg via INTRAVENOUS
  Filled 2020-03-18 (×2): qty 100

## 2020-03-18 MED ORDER — DILTIAZEM HCL-DEXTROSE 125-5 MG/125ML-% IV SOLN (PREMIX)
5.0000 mg/h | INTRAVENOUS | Status: DC
  Filled 2020-03-18: qty 125

## 2020-03-18 MED ORDER — ACETAMINOPHEN 650 MG RE SUPP: 650.0000 mg | Freq: Four times a day (QID) | RECTAL | Status: DC | PRN

## 2020-03-18 MED ORDER — AMIODARONE HCL 200 MG PO TABS
100.0000 mg | ORAL_TABLET | Freq: Every day | ORAL | Status: DC
  Administered 2020-03-18: 100 mg via ORAL
  Filled 2020-03-18: qty 1

## 2020-03-18 MED ORDER — INSULIN ASPART 100 UNIT/ML ~~LOC~~ SOLN
0.0000 [IU] | Freq: Three times a day (TID) | SUBCUTANEOUS | Status: DC
  Administered 2020-03-18 – 2020-03-21 (×4): 3 [IU] via SUBCUTANEOUS
  Administered 2020-03-23: 2 [IU] via SUBCUTANEOUS
  Administered 2020-03-24: 3 [IU] via SUBCUTANEOUS
  Administered 2020-03-25: 2 [IU] via SUBCUTANEOUS
  Administered 2020-03-26: 3 [IU] via SUBCUTANEOUS
  Administered 2020-03-27: 2 [IU] via SUBCUTANEOUS
  Administered 2020-03-28 (×2): 3 [IU] via SUBCUTANEOUS
  Administered 2020-03-29 (×2): 2 [IU] via SUBCUTANEOUS
  Administered 2020-03-31: 3 [IU] via SUBCUTANEOUS
  Administered 2020-04-01 – 2020-04-02 (×2): 2 [IU] via SUBCUTANEOUS
  Administered 2020-04-03: 5 [IU] via SUBCUTANEOUS
  Administered 2020-04-03: 8 [IU] via SUBCUTANEOUS
  Administered 2020-04-04 (×2): 3 [IU] via SUBCUTANEOUS
  Administered 2020-04-04: 2 [IU] via SUBCUTANEOUS
  Administered 2020-04-04: 5 [IU] via SUBCUTANEOUS
  Administered 2020-04-05: 2 [IU] via SUBCUTANEOUS
  Administered 2020-04-05: 3 [IU] via SUBCUTANEOUS

## 2020-03-18 MED ORDER — LACTATED RINGERS IV SOLN: INTRAVENOUS | Status: DC

## 2020-03-18 MED ORDER — LACTATED RINGERS IV BOLUS
1000.0000 mL | Freq: Once | INTRAVENOUS | Status: AC
  Administered 2020-03-18: 1000 mL via INTRAVENOUS

## 2020-03-18 MED ORDER — OXYCODONE HCL ER 10 MG PO T12A
20.0000 mg | EXTENDED_RELEASE_TABLET | Freq: Two times a day (BID) | ORAL | Status: DC
  Administered 2020-03-18 (×2): 20 mg via ORAL
  Filled 2020-03-18 (×2): qty 2

## 2020-03-18 MED ORDER — POLYETHYLENE GLYCOL 3350 17 G PO PACK: 17.0000 g | PACK | Freq: Every day | ORAL | Status: DC | PRN

## 2020-03-18 MED ORDER — SODIUM CHLORIDE 0.9 % IV SOLN
1.0000 g | Freq: Two times a day (BID) | INTRAVENOUS | Status: DC
  Administered 2020-03-18 – 2020-03-20 (×5): 1 g via INTRAVENOUS
  Filled 2020-03-18 (×8): qty 1

## 2020-03-18 MED ORDER — MAGNESIUM SULFATE 2 GM/50ML IV SOLN
2.0000 g | Freq: Once | INTRAVENOUS | Status: AC
  Administered 2020-03-18: 2 g via INTRAVENOUS
  Filled 2020-03-18: qty 50

## 2020-03-18 MED ORDER — ONDANSETRON HCL 4 MG PO TABS
8.0000 mg | ORAL_TABLET | Freq: Three times a day (TID) | ORAL | Status: DC | PRN
  Administered 2020-03-18 – 2020-03-26 (×14): 8 mg via ORAL
  Filled 2020-03-18 (×14): qty 2

## 2020-03-18 NOTE — Progress Notes (Addendum)
Patient arrived on 4E from the ED, assessment completed see flow sheet, placed on tele ccmd notified, patient oriented to room and staff, bed in lowest position call bell within recah  Shalhoub MD notified of critical calcium level of 6.1 will continue to monitor.

## 2020-03-18 NOTE — Progress Notes (Signed)
Pharmacy Antibiotic Note  YESMIN MUTCH is a 74 y.o. female admitted on 03/16/2020 with LE edema/cellulitis .  Pharmacy has been consulted for Vancomycin and Meropenem dosing.  Plan: Vancomycin 1500 mg IV now, then 1250 mg IV q48h Meropenem 1 g IV q12h     Temp (24hrs), Avg:98.8 F (37.1 C), Min:98.8 F (37.1 C), Max:98.8 F (37.1 C)  Recent Labs  Lab 03/28/2020 1905 03/12/2020 2225 03/18/20 0135  WBC 24.5*  --   --   CREATININE 2.03* 2.20*  --   LATICACIDVEN  --   --  2.2*    CrCl cannot be calculated (Unknown ideal weight.).    Allergies  Allergen Reactions  . Levaquin [Levofloxacin] Other (See Comments)    Suicidal thoughts, extreme anxiety, panic attack  . Tramadol Other (See Comments)    Fuzzy thinking and agitation per patient  . Phenergan [Promethazine Hcl] Other (See Comments)    Restless    Caryl Pina 03/18/2020 2:22 AM

## 2020-03-18 NOTE — Progress Notes (Signed)
Unable to do in & out cath due to bladder prolapse, will get UA /CS when patient voids.

## 2020-03-18 NOTE — H&P (Signed)
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  Patient explains that approximately 2 weeks ago she decided to do an exfoliative scrub of her bilateral lower extremities.  The days that followed patient developed numerous weeping lesions of the bilateral lower extremities.  Several days after that, the patient began to develop progressively worsening left thigh pain redness and swelling.  This pain is moderate to severe in intensity, sore in quality and nonradiating.  Pain is worse with movement of the affected extremity and improved with rest.  In the days that followed, patient also began to develop shortness of breath.  The shortness of breath is constant, worse with exertion and improved with rest.  Patient also complains of chest pain, radiating to her back but this has been ongoing since her diagnosis of breast cancer in May.  However the questioning patient denies fevers, sick contacts, recent travel or confirmed contact with COVID-19 infection.  Notably, patient is not vaccinated for COVID-19.  In the weeks that followed, patient's shortness of breath, weeping wounds of the lower extremities and left thigh pain continue to worsen.  Patient began to develop generalized weakness and poor appetite.  Patient also noticed that her urine output  began to gradually decrease in the days that preceded her presentation.  Patient eventually presented to [facility] emergency department for evaluation.  Upon evaluation in the emergency department patient was found to be in rapid atrial fibrillation.  Additionally, patient was found to have multiple SIRS criteria including a substantial leukocytosis of 24.5.  The hospitalist was then called to assess the patient for mission the hospital due to concerns for rapid atrial fibrillation and left lower extremity cellulitis.     Review of Systems:   Review of Systems  Constitutional: Positive for malaise/fatigue.  Respiratory: Positive for shortness of breath.   Musculoskeletal: Positive for joint pain and myalgias.  Neurological: Positive for weakness.  All other systems reviewed and are negative.     Past Medical History:  Diagnosis Date  . Abnormal PFT   . Breast cancer metastasized to bone, unspecified laterality ([place]) 12/06/2019  . CAD (coronary artery disease), native coronary artery - 3 vessel 04/21/2015   a. NSTEMI 8-04/2015 felt 2/2 demand ischemia. b. 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  . Chronic diastolic CHF (congestive heart failure) ([place])   . Chronic systolic CHF (congestive heart failure) ([place]) 10/02/2015  . Diabetes mellitus ([place])   . Family history of breast cancer   . Goals of care, counseling/discussion 12/06/2019  . Hypertrophic cardiomyopathy ([place])   . Left atrial enlargement   . Lynch syndrome   . Mitral regurgitation    a. Echo 04/2015: moderate mitral regurgitation. b. F/u echo 05/2015: mild MR.  . NSTEMI (non-ST elevated myocardial infarction) ([place]) 11/27/2016  . Paroxysmal atrial fibrillation ([name])   . Tricuspid regurgitation  a. Echo 04/2015: Mod-severe TR. b. Not mentioned on echo 05/2015  . Ventricular tachycardia ([place])    a. s/p [employer] Jude ICD implanted 04/2015.    Past Surgical History:    Procedure Laterality Date  . APPLICATION OF WOUND VAC Left 01/06/2013   Procedure: APPLICATION OF WOUND VAC X 2;  Surgeon: [name], MD;  Location: [facility];  Service: Orthopedics;  Laterality: Left;  left forearm  . APPLICATION OF WOUND VAC Left 01/09/2013   Procedure: APPLICATION OF WOUND VAC;  Surgeon: [name], MD;  Location: [facility];  Service: Orthopedics;  Laterality: Left;  . BREAST BIOPSY Left 12/07/2019    US guided L breast mass core biopsy    . CARDIAC CATHETERIZATION N/A 04/19/2015   Procedure: Left Heart Cath and Coronary Angiography;  Surgeon: [name], MD;  Location: [place] CV LAB;  Service: Cardiovascular;  Laterality: N/A;  . CARDIOVERSION N/A 09/20/2015   Procedure: CARDIOVERSION;  Surgeon: Pixie Casino, MD;  Location: [facility] ENDOSCOPY;  Service: Cardiovascular;  Laterality: N/A;  . CARDIOVERSION N/A 12/09/2019   Procedure: CARDIOVERSION;  Surgeon: Jolaine Artist, MD;  Location: [facility] ENDOSCOPY;  Service: Cardiovascular;  Laterality: N/A;  . CLIPPING OF ATRIAL APPENDAGE Left 12/04/2016   Procedure: CLIPPING OF ATRIAL APPENDAGE USING ATRICURE 80 ATRICLIP;  Surgeon: Melrose Nakayama, MD;  Location: [place];  Service: Open Heart Surgery;  Laterality: Left;  . CORONARY ARTERY BYPASS GRAFT N/A 12/04/2016   Procedure: CORONARY ARTERY BYPASS GRAFTING (CABG)x 4 WITH ENDOSCOPIC HARVESTING OF RIGHT SAPHENOUS VEIN;  Surgeon: Melrose Nakayama, MD;  Location: [place];  Service: Open Heart Surgery;  Laterality: N/A;  . DENTAL SURGERY  Oct. 2011   several extractions, bone graft  . EP IMPLANTABLE DEVICE N/A 04/16/2015   Procedure: ICD Implant;  Surgeon: [name], MD;  Location: [place] CV LAB;  Service: Cardiovascular;  Laterality: N/A;  . I & D EXTREMITY Left 01/06/2013   Procedure: IRRIGATION AND DEBRIDEMENT LEFT ELBOW AND LEFT FOREARM ;  Surgeon: [name], MD;  [facility];  Service: Orthopedics;  Laterality: Left;  . INCISION AND DRAINAGE  Left 01/09/2013   Procedure: REDO INCISION AND DRAINAGE LEFT ELBOW;  Surgeon: [name], MD;  [facility];  Service: Orthopedics;  Laterality: Left;  SUPINE, UPPER EXTERMITY DRAPE  . INTRAOPERATIVE TRANSESOPHAGEAL ECHOCARDIOGRAM N/A 12/04/2016   Procedure: INTRAOPERATIVE TRANSESOPHAGEAL ECHOCARDIOGRAM;  Surgeon: Melrose Nakayama, MD;  Location: [place];  Service: Open Heart Surgery;  Laterality: N/A;  . IR THORACENTESIS ASP PLEURAL SPACE W/IMG GUIDE  12/22/2016  . IR US GUIDE BX ASP/DRAIN  12/07/2019  . LEFT HEART CATH AND CORONARY ANGIOGRAPHY N/A 11/28/2016   Procedure: Left Heart Cath and Coronary Angiography;  Surgeon: Belva Crome, MD;  Location: [place] CV LAB;  Service: Cardiovascular;  Laterality: N/A;  . MAZE N/A 12/04/2016   Procedure: MAZE PROCEDURE;  Surgeon: Melrose Nakayama, MD;  Location: [place];  Service: Open Heart Surgery;  Laterality: N/A;  . RIGHT HEART CATH N/A 01/02/2017   Procedure: Right Heart Cath;  Surgeon: Jolaine Artist, MD;  Location: [place] CV LAB;  Service: Cardiovascular;  Laterality: N/A;  . SECONDARY CLOSURE OF WOUND Left 01/09/2013   Procedure: SECONDARY CLOSURE OF WOUND  LEFT ELBOW;  Surgeon: [name], MD;  [facility];  Service: Orthopedics;  Laterality: Left;  . TEE WITHOUT CARDIOVERSION N/A 09/20/2015   Procedure: TRANSESOPHAGEAL ECHOCARDIOGRAM (TEE);  Surgeon: Nadean Corwin  Hilty, MD;  Location: [place];  Service: Cardiovascular;  Laterality: N/A;  . TEE WITHOUT CARDIOVERSION N/A 12/09/2019   Procedure: TRANSESOPHAGEAL ECHOCARDIOGRAM (TEE);  Surgeon: Jolaine Artist, MD;  Location: [facility] ENDOSCOPY;  Service: Cardiovascular;  Laterality: N/A;     reports that she quit smoking about 39 years ago. She quit after 4.00 years of use. She has never used smokeless tobacco. She reports that she does not drink alcohol and does not use drugs.  Allergies  Allergen Reactions  . Levaquin [Levofloxacin] Other (See Comments)    Suicidal  thoughts, extreme anxiety, panic attack  . Tramadol Other (See Comments)    Fuzzy thinking and agitation per patient  . Phenergan [Promethazine Hcl] Other (See Comments)    Restless    Family History  Problem Relation Age of Onset  . Breast cancer Mother 71  . Pulmonary embolism Mother   . Diabetes Father   . Stroke Father   . Heart disease Sister   . Healthy Sister   . Gastric cancer Maternal Uncle   . Prostate cancer Maternal Uncle      Prior to Admission medications   Medication Sig Start Date End Date Taking? Authorizing Provider  acetaminophen (TYLENOL) 500 MG tablet Take 500-1,000 mg by mouth every 6 (six) hours as needed for headache (pain).    [provider]  amiodarone (PACERONE) 100 MG tablet Take 1 tablet (100 mg total) by mouth daily. 01/30/20   Clegg, Amy D, NP  Blood Glucose Monitoring Suppl (TRUE METRIX METER) DEVI 1 each by Does not apply route 2 (two) times daily. 04/27/15   Charlott Rakes, MD  ELIQUIS 5 MG TABS tablet TAKE ONE TABLET BY MOUTH TWICE A DAY 01/26/20   Bensimhon, Shaune Pascal, MD  furosemide (LASIX) 80 MG tablet Take 1 tablet (80 mg total) by mouth daily. Patient not taking: Reported on 01/30/2020 12/19/19   Darrick Grinder D, NP  glipiZIDE (GLUCOTROL) 5 MG tablet Take 1 tablet (5 mg total) by mouth daily. Patient not taking: Reported on 01/30/2020 06/16/16   Charlott Rakes, MD  glucose blood (TRUE METRIX BLOOD GLUCOSE TEST) test strip Use as instructed twice daily Patient not taking: Reported on 01/30/2020 04/27/15   Charlott Rakes, MD  losartan (COZAAR) 25 MG tablet Take 25 mg by mouth daily. Patient not taking: Reported on 01/30/2020 12/08/19   [provider]  Magnesium Hydroxide (MILK OF MAGNESIA CONCENTRATE PO) Take by mouth every 8 (eight) hours as needed.    [provider]  ondansetron (ZOFRAN) 4 MG tablet Take 1 tablet (4 mg total) by mouth every 4 (four) hours as needed. 03/15/20   Volanda Napoleon, MD  ondansetron (ZOFRAN-ODT) 8  MG disintegrating tablet Take 1 tablet (8 mg total) by mouth every 8 (eight) hours as needed for nausea or vomiting. Patient not taking: Reported on 01/30/2020 01/23/20   Eppie Gibson, MD  oxyCODONE (OXY IR/ROXICODONE) 5 MG immediate release tablet Take 1 tablet (5 mg total) by mouth every 6 (six) hours as needed for moderate pain or severe pain. 02/29/20   Volanda Napoleon, MD  oxyCODONE (OXYCONTIN) 20 mg 12 hr tablet Take 1 tablet (20 mg total) by mouth every 12 (twelve) hours. 03/15/20   Volanda Napoleon, MD  palbociclib (IBRANCE) 100 MG tablet Take 1 tablet (100 mg total) by mouth daily. Take for 21 days on, 7 days off, repeat every 28 days. 12/28/19   Volanda Napoleon, MD  TRUEPLUS LANCETS 28G MISC Use as instructed  twice daily. 04/27/15   Charlott Rakes, MD    Physical Exam: Vitals:   03/06/2020 2054 03/18/20 0013 03/18/20 0100 03/18/20 0200  BP: (!) 122/99 117/74 113/66 99/66  Pulse: (!) 124 (!) 130 (!) 122 (!) 129  Resp: 18 18 19  (!) 21  Temp:      TempSrc:      SpO2: 100% 98% 96% 96%    Constitutional: Acute alert and oriented x3, patient is in distress due to pain of the chest back and left thigh. Skin: Numerous small circumferential shallow ulcerations of the distal bilateral lower extremities with many of these lesions weeping clear serous fluid.  Several of these ulcerations however have a purulent base.  There is additionally an area of significant redness induration and warmth of the left lateral thigh.  Skin turgor is extremely poor overall. Eyes: Pupils are equally reactive to light.  No evidence of scleral icterus or conjunctival pallor.  ENMT: Extremely dry mucous membranes noted.  Posterior pharynx clear of any exudate or lesions.   Neck: normal, supple, no masses, no thyromegaly.  No evidence of jugular venous distension.   Respiratory:  no wheezing, minimal bibasilar rales, normal respiratory effort. No accessory muscle use.  Cardiovascular: Tachycardic rate and irregularly  irregular rhythm, no murmurs / rubs / gallops.  Bilateral lower extremity pitting edema that tracks up to the thighs.  2+ pedal pulses. No carotid bruits.  Chest:   Nontender without crepitus or deformity.   Back:   Nontender without crepitus or deformity. Abdomen: Abdomen is soft and nontender.  No evidence of intra-abdominal masses.  Positive bowel sounds noted in all quadrants.   Musculoskeletal: Skin examination of the distal bilateral lower extremities as noted in the skin exam.  Significant tenderness noted of the left thigh with associated redness and induration as noted above.  Limited range of motion of the left knee and left hip due to pain.  . Normal muscle tone.  Neurologic: CN 2-12 grossly intact. Sensation intact, patient is moving all 4 extremities spontaneously patient is following all commands.  Patient is responsive to verbal stimuli.   Psychiatric: Patient presents as a depressed mood with flat affect. Patient seems to possess insight as to theircurrent situation.     Labs on Admission: I have personally reviewed following labs and imaging studies -   CBC: Recent Labs  Lab 03/25/2020 1905 03/24/2020 2225  WBC 24.5*  --   HGB 11.9* 15.0  HCT 40.5 44.0  MCV 89.6  --   PLT 279  --    Basic Metabolic Panel: Recent Labs  Lab 03/20/2020 1905 03/26/2020 2225  NA 129* 130*  K 5.4* 5.0  CL 99 98  CO2 15*  --   GLUCOSE 184* 138*  BUN 55* 55*  CREATININE 2.03* 2.20*  CALCIUM 6.2*  --    GFR: CrCl cannot be calculated (Unknown ideal weight.). Liver Function Tests: Recent Labs  Lab 04/03/2020 2206  AST 38  ALT 22  ALKPHOS 152*  BILITOT 2.7*  PROT 6.9  ALBUMIN 3.0*   No results for input(s): LIPASE, AMYLASE in the last 168 hours. No results for input(s): AMMONIA in the last 168 hours. Coagulation Profile: No results for input(s): INR, PROTIME in the last 168 hours. Cardiac Enzymes: No results for input(s): CKTOTAL, CKMB, CKMBINDEX, TROPONINI in the last 168  hours. BNP (last 3 results) No results for input(s): PROBNP in the last 8760 hours. HbA1C: No results for input(s): HGBA1C in the last 72 hours. CBG:  Recent Labs  Lab 03/18/20 0020  GLUCAP 150*   Lipid Profile: No results for input(s): CHOL, HDL, LDLCALC, TRIG, CHOLHDL, LDLDIRECT in the last 72 hours. Thyroid Function Tests: No results for input(s): TSH, T4TOTAL, FREET4, T3FREE, THYROIDAB in the last 72 hours. Anemia Panel: No results for input(s): VITAMINB12, FOLATE, FERRITIN, TIBC, IRON, RETICCTPCT in the last 72 hours. Urine analysis:    Component Value Date/Time   COLORURINE YELLOW 12/04/2019 1911   APPEARANCEUR HAZY (A) 12/04/2019 1911   LABSPEC 1.010 12/04/2019 1911   PHURINE 5.0 12/04/2019 1911   GLUCOSEU NEGATIVE 12/04/2019 1911   HGBUR NEGATIVE 12/04/2019 1911   BILIRUBINUR NEGATIVE 12/04/2019 1911   KETONESUR NEGATIVE 12/04/2019 1911   PROTEINUR 30 (A) 12/04/2019 1911   UROBILINOGEN 1.0 05/11/2015 2209   NITRITE NEGATIVE 12/04/2019 1911   LEUKOCYTESUR MODERATE (A) 12/04/2019 1911    Radiological Exams on Admission - Personally Reviewed: DG Chest 2 View  Result Date: 03/09/2020 CLINICAL DATA:  Chest pain EXAM: CHEST - 2 VIEW COMPARISON:  12/04/2019, PET CT 12/22/2019 FINDINGS: Post sternotomy changes with left atrial appendage clip. Left-sided pacing device as before. Cardiomegaly with mildly enlarged central pulmonary vessels. No overt edema. No consolidation, pleural effusion, or pneumothorax. Moderate compression deformity of mid to lower thoracic vertebra, new or increased as compared to chest CT from May 2021. IMPRESSION: Cardiomegaly without overt failure. New mild to moderate compression deformity of mid to lower thoracic vertebra. Electronically Signed   By: Donavan Foil M.D.   On: 03/13/2020 20:18    EKG: Personally reviewed.  Rhythm is atrial fibrillation with heart rate of 137 bpm.  T wave inversions noted in the lateral and inferior leads.      Assessment/Plan Principal Problem:   Sepsis due to cellulitis [facility])   Patient exhibiting multiple SIRS criteria including tachycardia, tachypnea and leukocytosis in the setting of extensive left thigh cellulitis and organ injury with acute kidney injury all concerning for sepsis  Considering patient's immunocompromised state with active breast cancer and empirically treating the patient with broad-spectrum intravenous antibiotic therapy with vancomycin and meropenem based on the cellulitis order set.  Blood cultures have been ordered  Hydrating patient with intravenous isotonic fluids while concurrently monitoring for volume overload considering patient's history of systolic congestive heart failure.  Admitting patient to stepdown unit    Paroxysmal atrial fibrillation with rapid ventricular response ([place])   Patient is presenting with rapid atrial fibrillation despite being compliant with her daily amiodarone therapy  Considering patient's reported history of developing what sounds to be a substantial cellulitis of the left thigh the past 2 weeks -I believe that the cellulitis and subsequent sepsis are what have precipitated the patient's rapid atrial fibrillation at this time  While patient does give the appearance of bilateral lower extremity edema, I believe the patient is actually intravascularly depleted.  I feel the key at this point is to treat the patient's underlying infection with intravenous antibiotics and hydrate the patient considering concerns for sepsis.  If heart rate is not rapidly controlled then will initiate small boluses of intravenous metoprolol to attempt to achieve rate control.  Admitting patient to stepdown unit  If atrial fibrillation does not rapidly come under control will consult cardiology in the morning.  Continue home regimen of amiodarone  Active Problems:   Acute kidney injury ([place])   Possibly secondary to hypoperfusion due to sepsis,  alternatively possibly secondary to rapid atrial fibrillation.  Patient is suffering from concurrent hyperkalemia  Treating underlying sepsis with  intravenous antibiotics while concurrently hydrating patient with intravenous isotonic fluids  Strict input and output monitoring  Monitoring renal function and electrolytes with serial chemistries    Cellulitis of left lower extremity   Please see assessment and plan above    Hyperkalemia   Patient exhibiting mild hyperkalemia secondary to diminished renal excretion  Monitoring patient on telemetry  Hydrating patient with intravenous isotonic fluids which I fully anticipate will resolve hyperkalemia.  Monitoring potassium levels with serial chemistries.    Lactic acidosis   Patient exhibiting mild lactic acidosis, likely secondary to underlying sepsis and organ hypoperfusion  Treating underlying infection with broad-spectrum intravenous antibiotic therapy, hydrating patient with intravenous isotonic fluids.  Perform serial lactic acid levels to ensure downtrending and resolution    Chronic systolic CHF (congestive heart failure) ([place])   Patient does exhibit bilateral lower extremity pitting edema up to the thighs, I do not believe the patient is suffering from acute congestive heart failure and rather that the patient is intravascularly volume depleted.  Bilateral lower extremity edema is probably more likely secondary to third spacing from poor nutritional status and renal failure.  As we hydrate patient due to concurrent sepsis and rapid atrial fibrillation, will attempt to be extremely cautious and monitor patient for signs of progressive volume overload and pulmonary edema.  Temporarily holding home regimen of daily Lasix therapy which can be quickly restarted once patient is more intravascularly repleted/euvolemic.    Hypocalcemia  Notable substantial hypocalcemia despite correction for hypoalbuminemia  Providing  patient with 2 g of calcium gluconate  Obtaining ionized calcium    Dehydration with hyponatremia  Considering patient's history and sepsis I believe the cause of the patient's hyponatremia is volume depletion.  Hydrate patient with intravenous isotonic fluids  Monitoring sodium levels with serial chemistries   Code Status:  Full code Family Communication: deferred   Status is: Inpatient  Remains inpatient appropriate because:Ongoing diagnostic testing needed not appropriate for outpatient work up, IV treatments appropriate due to intensity of illness or inability to take PO and Inpatient level of care appropriate due to severity of illness   Dispo: The patient is from: Home              Anticipated d/c is to: Home              Anticipated d/c date is: 3 days              Patient currently is not medically stable to d/c.        Vernelle Emerald MD [facility] Pager [phone]  If 7PM-7AM, please contact night-coverage [email or web address] Use universal [place] password for that web site. If you do not have the password, please call the hospital operator.  03/18/2020, 2:42 AM

## 2020-03-18 NOTE — Progress Notes (Addendum)
PROGRESS NOTE  [name]  DOB: 1946/05/14  PCP: Patient, No Pcp Per GQQ:[id number]  DOA: 03/28/2020  LOS: 0 days   Chief Complaint  Patient presents with  . Chest Pain    Brief narrative: [name] is a 74 y.o. 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 fib, ventricular tachycardia status post ICD in situ, breast cancer with bone mets, Lynch syndrome,  Patient presented to the ED on 03/31/2020 with complaint of shortness of breath, worsening redness on both upper legs with weeping lesions. 2 weeks prior to presentation.  Patient had an exfoliative scrub of her bilateral lower extremities.  In subsequent few days, patient started having bilateral lower extremity redness, pain and weeping lesions extending up to the thigh associated with pain worsening on ambulation.  She also reported chest pain and shortness of breath which he apparently has since her diagnosis of breast cancer in May.  In the ED, patient was afebrile, she was in A. fib with RVR to 120s, blood pressure stable, breathing on room air. Labs showed WBC count elevated to 24.5, lactic acid 2.2, sodium low at 129, potassium elevated to 5.4, BUN/creatinine 55/2.03, serum bicarb low at 15,  Chest x-ray showed cardiomegaly without overt failure. New mild to moderate compression deformity of mid to lower thoracic vertebra.  Patient was admitted to hospital service for sepsis secondary to bilateral lower extremity cellulitis.  Subjective: Patient was seen and examined this morning. Pleasant elderly Caucasian female.  Lying down in bed.  Not in distress. Feels better than presentation. She reports she does not have chronic lower extremity swelling or wound.  Assessment/Plan: Sepsis -POA Bilateral lower extremity cellulitis -Presented with worsening redness, pain, weeping lesions of both lower extremities. -Met sepsis criteria with tachycardia,  tachypnea, leukocytosis, lactic acidosis and source. -Blood culture sent. -Because of her immunocompromised status, patient was started on IV vancomycin and IV meropenem.  Continue same for today. -Currently on LR at 125/h.  Blood pressure in low 100s.  Because of her coexisting CHF, I would slow down fluids to 75 mill per hour. -Repeat lactic acid level tomorrow.  Acute kidney injury on CKD 3a -Baseline creatinine seems to be between 1-1.3 although with wide fluctuations noted in chart reviewed. -Presented with creatinine elevated to 2.03.  Expect improvement with IV hydration. -Continue to monitor. Recent Labs    12/09/19 0345 12/10/19 0646 12/19/19 1550 12/26/19 1113 01/10/20 1153 01/26/20 1330 02/21/20 1200 03/14/2020 1905 03/28/2020 2225 03/18/20 0354  CREATININE 2.08* 2.19* 1.25* 1.79* 2.12* 1.33* 1.01* 2.03* 2.20* 2.08*   Abnormal electrolytes Hyponatremia, hyperkalemia, hypocalcemia, hypomagnesemia -Initial labs with sodium low at 129 and potassium elevated to 5.4. -Repeat labs this morning with sodium 130 and potassium at 5.1, magnesium at 1.9. -Ordered for magnesium 2 g IV, calcium gluconate IV. -Continue LR. -Repeat labs tomorrow.  A. fib with RVR -Has history of paroxysmal A. fib and also V. tach with ICD in situ -Presented with RVR, likely precipitated by sepsis. -On amiodarone 100 mg daily at home which has been resumed. -Patient continues to remain in RVR but rate mostly under 120. -Because of coexisting systolic heart failure, I would hesitate to start on Cardizem drip at this time.  Continue to monitor. -Continue Eliquis for anticoagulation.  Chronic combined CHF/ HTN -EF 35-40% 12/2019, follows with Dr. Haroldine Laws as an outpatient. -Home meds include Lasix 80 mg daily and losartan 25 mg daily. -Currently both on  hold because of sepsis and AKI. -Continue to monitor.  CAD s/p CABG; HLD -On Eliquis.  I do not see statin in the home list  Diabetes mellitus  type 2 -A1c 7.2 on 8/15. -On glipizide 5 mg daily at home. -Currently on sliding scale insulin with Accu-Cheks.  Continue to monitor  Breast cancer with bone mets, Lynch syndrome,  -Breast cancer diagnosed in May. -Takes Ibrance at home.  Currently on hold.. -Oncology follow-up as an outpatient. -Chest x-ray on admission showed mild to moderate compression deformity of mid to lower thoracic vertebra. -Continue pain control.  Chronic nausea -Continue Zofran as needed  Mobility: Uses walker at home.  PT eval ordered. Code Status:   Code Status: Full Code  Nutritional status: Body mass index is 28 kg/m.     Diet Order            Diet heart healthy/carb modified Room service appropriate? Yes; Fluid consistency: Thin  Diet effective now                 DVT prophylaxis:  apixaban (ELIQUIS) tablet 5 mg   Antimicrobials:  IV meropenem, IV vancomycin Fluid: LR at 73  Consultants: None Family Communication:  None at bedside  Status is: Inpatient  Remains inpatient appropriate because:Hemodynamically unstable, Persistent severe electrolyte disturbances, Ongoing diagnostic testing needed not appropriate for outpatient work up and IV treatments appropriate due to intensity of illness or inability to take PO   Dispo: The patient is from: Home              Anticipated d/c is to: Home              Anticipated d/c date is: > 3 days              Patient currently is not medically stable to d/c.       Infusions:  . lactated ringers 125 mL/hr at 03/18/20 0542  . magnesium sulfate bolus IVPB    . meropenem (MERREM) IV    . ondansetron (ZOFRAN) IV    . [START ON 03/20/2020] vancomycin      Scheduled Meds: . amiodarone  100 mg Oral Daily  . apixaban  5 mg Oral BID  . insulin aspart  0-15 Units Subcutaneous TID AC & HS  . oxyCODONE  20 mg Oral Q12H    Antimicrobials: Anti-infectives (From admission, onward)   Start     Dose/Rate Route Frequency Ordered Stop   03/20/20  0600  vancomycin (VANCOREADY) IVPB 1250 mg/250 mL     Discontinue     1,250 mg 166.7 mL/hr over 90 Minutes Intravenous Every 48 hours 03/18/20 0230     03/18/20 0330  vancomycin (VANCOREADY) IVPB 1500 mg/300 mL        1,500 mg 150 mL/hr over 120 Minutes Intravenous  Once 03/18/20 0230 03/18/20 0803   03/18/20 0330  meropenem (MERREM) 1 g in sodium chloride 0.9 % 100 mL IVPB     Discontinue     1 g 200 mL/hr over 30 Minutes Intravenous 2 times daily 03/18/20 0230     03/18/20 0130  piperacillin-tazobactam (ZOSYN) IVPB 3.375 g  Status:  Discontinued        3.375 g 100 mL/hr over 30 Minutes Intravenous  Once 03/18/20 0115 03/18/20 0214      PRN meds: acetaminophen **OR** acetaminophen, ondansetron **OR** ondansetron (ZOFRAN) IV, polyethylene glycol   Objective: Vitals:   03/18/20 0545 03/18/20 0721  BP: 109/71 (!) 119/95  Pulse:  Resp: 17 19  Temp: 97.8 F (36.6 C) 97.6 F (36.4 C)  SpO2: 94% 97%    Intake/Output Summary (Last 24 hours) at 03/18/2020 1107 Last data filed at 03/18/2020 0423 Gross per 24 hour  Intake 1000 ml  Output --  Net 1000 ml   Filed Weights   03/18/20 0423 03/18/20 0523  Weight: 77.1 kg 74 kg   Weight change:  Body mass index is 28 kg/m.   Physical Exam: General exam: Appears calm and comfortable.  Not in physical distress Skin: No rashes, lesions or ulcers. HEENT: Atraumatic, normocephalic, supple neck, no obvious bleeding Lungs: Clear to auscultation bilaterally CVS: A. fib with RVR, no murmur GI/Abd soft, nontender, nondistended, bowel sound present CNS: Alert, awake, oriented x3 Psychiatry: Mood appropriate Extremities: Bilateral lower extremity cellulitis up to mid leg, some fluid-filled blisters on the calf.  No active wheezing.  Data Review: I have personally reviewed the laboratory data and studies available.  Recent Labs  Lab 03/19/2020 1905 03/08/2020 2225 03/18/20 0241 03/18/20 0354  WBC 24.5*  --   --  23.0*  NEUTROABS  --    --   --  21.4*  HGB 11.9* 15.0 15.0 12.0  HCT 40.5 44.0 44.0 39.3  MCV 89.6  --   --  88.5  PLT 279  --   --  290   Recent Labs  Lab 03/04/2020 1905 03/29/2020 2225 03/18/20 0241 03/18/20 0354  NA 129* 130* 131* 130*  K 5.4* 5.0 5.1 5.1  CL 99 98  --  98  CO2 15*  --   --  18*  GLUCOSE 184* 138*  --  139*  BUN 55* 55*  --  56*  CREATININE 2.03* 2.20*  --  2.08*  CALCIUM 6.2*  --   --  6.1*  MG  --   --   --  1.9   Lab Results  Component Value Date   HGBA1C 7.2 (H) 03/18/2020       Component Value Date/Time   CHOL 104 12/05/2019 0442   CHOL 183 07/05/2019 1137   TRIG 90 12/05/2019 0442   HDL 25 (L) 12/05/2019 0442   HDL 45 07/05/2019 1137   CHOLHDL 4.2 12/05/2019 0442   VLDL 18 12/05/2019 0442   LDLCALC 61 12/05/2019 0442   LDLCALC 110 (H) 07/05/2019 1137   LABVLDL 28 07/05/2019 1137    Signed, Terrilee Croak, MD [facility] Pager: [phone] (Secure Chat preferred). 03/18/2020

## 2020-03-18 NOTE — ED Provider Notes (Signed)
[facility] EMERGENCY DEPARTMENT Provider Note   CSN: [id number] Arrival date & time: 03/26/2020  1858     History Chief Complaint  Patient presents with  . Chest Pain    [name] is a 74 y.o. female.  The history is provided by the patient and medical records. No language interpreter was used.  Chest Pain  [name] is a 74 y.o. female who presents to the Emergency Department complaining of multiple complaints. She presents the emergency department complaining of feeling poorly for the last week. She complains of pain to the left side of her back just behind her shoulder blade as well as progressive shortness of breath, dyspnea on exertion, cough, vomiting, lower extremity edema. She denies any fevers, abdominal pain, diarrhea. She has a history of atrial fibrillation, metastatic breast cancer on chemotherapy. She states that she was recently seen for increase fluid on her legs and her Lasix dose was increased to 80 mg daily. She states that she feels like that is not helping. She lives at home with family. She has not been vaccinated for COVID-19. No known sick contacts.    Past Medical History:  Diagnosis Date  . Abnormal PFT   . Breast cancer metastasized to bone, unspecified laterality ([place]) 12/06/2019  . CAD (coronary artery disease), native coronary artery - 3 vessel 04/21/2015   a. NSTEMI 8-04/2015 felt 2/2 demand ischemia. b. 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  . Chronic diastolic CHF (congestive heart failure) ([place])   . Chronic systolic CHF (congestive heart failure) ([place]) 10/02/2015  . Diabetes mellitus ([place])   . Family history of breast cancer   . Goals of care, counseling/discussion 12/06/2019  . Hypertrophic cardiomyopathy ([place])   . Left atrial enlargement   . Lynch syndrome   . Mitral regurgitation    a. Echo 04/2015: moderate mitral regurgitation. b. F/u echo 05/2015: mild MR.  .  NSTEMI (non-ST elevated myocardial infarction) ([place]) 11/27/2016  . Paroxysmal atrial fibrillation ([name])   . Tricuspid regurgitation    a. Echo 04/2015: Mod-severe TR. b. Not mentioned on echo 05/2015  . Ventricular tachycardia ([place])    a. s/p St. Jude ICD implanted 04/2015.    Patient Active Problem List   Diagnosis Date Noted  . Sepsis due to cellulitis ([place]) 03/18/2020  . Hypocalcemia 03/18/2020  . Cellulitis of left lower extremity 03/18/2020  . Hyperkalemia 03/18/2020  . Dehydration with hyponatremia 03/18/2020  . Lactic acidosis 03/18/2020  . Family history of breast cancer   . Lynch syndrome   . Genetic testing 01/09/2020  . Breast cancer metastasized to bone ([place]) 12/06/2019  . Goals of care, counseling/discussion 12/06/2019  . HTN (hypertension)   . Chest pain   . Pressure injury of skin 12/05/2016  . 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 NSTEMI (non-ST elevated myocardial infarction) ([place]) 11/27/2016  . Ventricular fibrillation ([place]) 11/27/2016  . Head injury   . ICD (implantable cardioverter-defibrillator) discharge   . Diabetic neuropathy ([place]) 06/16/2016  . CKD (chronic kidney disease) stage 3, GFR 30-59 ml/min 06/16/2016  . Fatigue 10/30/2015  . Paroxysmal atrial fibrillation with rapid ventricular response ([place]) 10/02/2015  . Chronic systolic CHF (congestive heart failure) ([place]) 10/02/2015  . Atrial flutter ([place]) 09/18/2015  . Chronic anticoagulation-Xarelto 09/18/2015  . Acute on chronic congestive heart failure ([place]) 09/18/2015  . Orthostatic hypotension   . ICD in place- St Jude   . HLD (hyperlipidemia) 05/11/2015  .  Syncope 05/11/2015  . Abdominal pain 05/11/2015  . Acute kidney injury ([place])   . Nausea & vomiting   . Hypothyroidism 04/27/2015  . Chronic diastolic CHF (congestive heart failure) ([place])   . Abnormal PFT   . CAD (coronary artery disease) 04/21/2015  . Acute respiratory failure ([place])   . Ventricular tachycardia- Sept 2016- ICD 04/13/2015  . UTI (lower  urinary tract infection) 03/31/2015  . Sepsis ([place]) 03/31/2015  . Atrial fibrillation with RVR ([place]) 03/31/2015  . Systolic CHF ([place])   . Hypertrophic cardiomyopathy ([place])   . Septic bursitis of elbow 02/03/2013  . Acute blood loss anemia 01/08/2013    Class: Acute  . Diabetes mellitus type 2, noninsulin dependent ([place])   . Cellulitis 12/31/2012  . Fasting hyperglycemia 12/31/2012  . Leukocytosis 12/31/2012  . Hypotension 12/31/2012    Past Surgical History:  Procedure Laterality Date  . APPLICATION OF WOUND VAC Left 01/06/2013   Procedure: APPLICATION OF WOUND VAC X 2;  Surgeon: [name], MD;  Location: [facility];  Service: Orthopedics;  Laterality: Left;  left forearm  . APPLICATION OF WOUND VAC Left 01/09/2013   Procedure: APPLICATION OF WOUND VAC;  Surgeon: [name], MD;  Location: [facility];  Service: Orthopedics;  Laterality: Left;  . BREAST BIOPSY Left 12/07/2019    US guided L breast mass core biopsy    . CARDIAC CATHETERIZATION N/A 04/19/2015   Procedure: Left Heart Cath and Coronary Angiography;  Surgeon: [name], MD;  Location: [facility];  Service: Cardiovascular;  Laterality: N/A;  . CARDIOVERSION N/A 09/20/2015   Procedure: CARDIOVERSION;  Surgeon: Pixie Casino, MD;  Location: [facility] ENDOSCOPY;  Service: Cardiovascular;  Laterality: N/A;  . CARDIOVERSION N/A 12/09/2019   Procedure: CARDIOVERSION;  Surgeon: Jolaine Artist, MD;  Location: [facility]COPY;  Service: Cardiovascular;  Laterality: N/A;  . CLIPPING OF ATRIAL APPENDAGE Left 12/04/2016   Procedure: CLIPPING OF ATRIAL APPENDAGE USING ATRICURE 30 ATRICLIP;  Surgeon: Melrose Nakayama, MD;  Location: [facility];  Service: Open Heart Surgery;  Laterality: Left;  . CORONARY ARTERY BYPASS GRAFT N/A 12/04/2016   Procedure: CORONARY ARTERY BYPASS GRAFTING (CABG)x 4 WITH ENDOSCOPIC HARVESTING OF RIGHT SAPHENOUS VEIN;  Surgeon: Melrose Nakayama, MD;  Location: [facility];  Service: Open Heart Surgery;   Laterality: N/A;  . DENTAL SURGERY  Oct. 2011   several extractions, bone graft  . EP IMPLANTABLE DEVICE N/A 04/16/2015   Procedure: ICD Implant;  Surgeon: [name], MD;  Location: [facility];  Service: Cardiovascular;  Laterality: N/A;  . I & D EXTREMITY Left 01/06/2013   Procedure: IRRIGATION AND DEBRIDEMENT LEFT ELBOW AND LEFT FOREARM ;  Surgeon: [name], MD;  Location: [facility];  Service: Orthopedics;  Laterality: Left;  . INCISION AND DRAINAGE Left 01/09/2013   Procedure: REDO INCISION AND DRAINAGE LEFT ELBOW;  Surgeon: [name], MD;  Location: [facility];  Service: Orthopedics;  Laterality: Left;  SUPINE, UPPER EXTERMITY DRAPE  . INTRAOPERATIVE TRANSESOPHAGEAL ECHOCARDIOGRAM N/A 12/04/2016   Procedure: INTRAOPERATIVE TRANSESOPHAGEAL ECHOCARDIOGRAM;  Surgeon: Melrose Nakayama, MD;  Location: [facility];  Service: Open Heart Surgery;  Laterality: N/A;  . IR THORACENTESIS ASP PLEURAL SPACE W/IMG GUIDE  12/22/2016  . IR US GUIDE BX ASP/DRAIN  12/07/2019  . LEFT HEART CATH AND CORONARY ANGIOGRAPHY N/A 11/28/2016   Procedure: Left Heart Cath and Coronary Angiography;  Surgeon: Belva Crome, MD;  Location: [facility];  Service: Cardiovascular;  Laterality: N/A;  . MAZE N/A 12/04/2016   Procedure: MAZE PROCEDURE;  Surgeon: Melrose Nakayama, MD;  Location: [facility];  Service: Open Heart Surgery;  Laterality: N/A;  . RIGHT HEART CATH N/A 01/02/2017   Procedure: Right Heart Cath;  Surgeon: Jolaine Artist, MD;  Location: [facility] LAB;  Service: Cardiovascular;  Laterality: N/A;  . SECONDARY CLOSURE OF WOUND Left 01/09/2013   Procedure: SECONDARY CLOSURE OF WOUND  LEFT ELBOW;  Surgeon: [name], MD;  [facility];  Service: Orthopedics;  Laterality: Left;  . TEE WITHOUT CARDIOVERSION N/A 09/20/2015   Procedure: TRANSESOPHAGEAL ECHOCARDIOGRAM (TEE);  Surgeon: Pixie Casino, MD;  Location: [facility];  Service: Cardiovascular;  Laterality: N/A;  . TEE WITHOUT  CARDIOVERSION N/A 12/09/2019   Procedure: TRANSESOPHAGEAL ECHOCARDIOGRAM (TEE);  Surgeon: Jolaine Artist, MD;  Location: [facility] ENDOSCOPY;  Service: Cardiovascular;  Laterality: N/A;     OB History    Gravida      Para      Term      Preterm      AB      Living  1     SAB      TAB      Ectopic      Multiple      Live Births              Family History  Problem Relation Age of Onset  . Breast cancer Mother 44  . Pulmonary embolism Mother   . Diabetes Father   . Stroke Father   . Heart disease Sister   . Healthy Sister   . Gastric cancer Maternal Uncle   . Prostate cancer Maternal Uncle     Social History   Tobacco Use  . Smoking status: Former Smoker    Years: 4.00    Quit date: 12/31/1980    Years since quitting: 39.2  . Smokeless tobacco: Never Used  Vaping Use  . Vaping Use: Never used  Substance Use Topics  . Alcohol use: No  . Drug use: No    Home Medications Prior to Admission medications   Medication Sig Start Date End Date Taking? Authorizing Provider  acetaminophen (TYLENOL) 500 MG tablet Take 500-1,000 mg by mouth every 6 (six) hours as needed for headache (pain).    [provider]  amiodarone (PACERONE) 100 MG tablet Take 1 tablet (100 mg total) by mouth daily. 01/30/20   Clegg, Amy D, NP  Blood Glucose Monitoring Suppl (TRUE METRIX METER) DEVI 1 each by Does not apply route 2 (two) times daily. 04/27/15   Charlott Rakes, MD  ELIQUIS 5 MG TABS tablet TAKE ONE TABLET BY MOUTH TWICE A DAY 01/26/20   Bensimhon, Shaune Pascal, MD  furosemide (LASIX) 80 MG tablet Take 1 tablet (80 mg total) by mouth daily. Patient not taking: Reported on 01/30/2020 12/19/19   Darrick Grinder D, NP  glipiZIDE (GLUCOTROL) 5 MG tablet Take 1 tablet (5 mg total) by mouth daily. Patient not taking: Reported on 01/30/2020 06/16/16   Charlott Rakes, MD  glucose blood (TRUE METRIX BLOOD GLUCOSE TEST) test strip Use as instructed twice daily Patient not taking: Reported  on 01/30/2020 04/27/15   Charlott Rakes, MD  losartan (COZAAR) 25 MG tablet Take 25 mg by mouth daily. Patient not taking: Reported on 01/30/2020 12/08/19   [provider]  Magnesium Hydroxide (MILK OF MAGNESIA CONCENTRATE PO) Take by mouth every 8 (eight) hours as needed.    [provider]  ondansetron (ZOFRAN) 4 MG tablet Take 1 tablet (4 mg total) by mouth every 4 (four) hours as needed. 03/15/20   Volanda Napoleon, MD  ondansetron (ZOFRAN-ODT) 8 MG disintegrating tablet Take 1 tablet (8 mg total) by mouth every 8 (eight) hours as needed for nausea or vomiting. Patient not taking: Reported on 01/30/2020 01/23/20   Eppie Gibson, MD  oxyCODONE (OXY IR/ROXICODONE) 5 MG immediate release tablet Take 1 tablet (5 mg total) by mouth every 6 (six) hours as needed for moderate pain or severe pain. 02/29/20   Volanda Napoleon, MD  oxyCODONE (OXYCONTIN) 20 mg 12 hr tablet Take 1 tablet (20 mg total) by mouth every 12 (twelve) hours. 03/15/20   Volanda Napoleon, MD  palbociclib (IBRANCE) 100 MG tablet Take 1 tablet (100 mg total) by mouth daily. Take for 21 days on, 7 days off, repeat every 28 days. 12/28/19   Volanda Napoleon, MD  TRUEPLUS LANCETS 28G MISC Use as instructed twice daily. 04/27/15   Charlott Rakes, MD    Allergies    Levaquin [levofloxacin], Tramadol, and Phenergan [promethazine hcl]  Review of Systems   Review of Systems  Cardiovascular: Positive for chest pain.  All other systems reviewed and are negative.   Physical Exam Updated Vital Signs BP 99/66   Pulse (!) 129   Temp 98.8 F (37.1 C) (Oral)   Resp (!) 21   SpO2 96%   Physical Exam Vitals and nursing note reviewed.  Constitutional:      General: She is in acute distress.     Appearance: She is well-developed. She is ill-appearing.  HENT:     Head: Normocephalic and atraumatic.  Cardiovascular:     Rate and Rhythm: Tachycardia present. Rhythm irregular.     Heart sounds: No murmur heard.   Pulmonary:       Effort: Pulmonary effort is normal. No respiratory distress.     Breath sounds: Normal breath sounds.  Abdominal:     Palpations: Abdomen is soft.     Tenderness: There is no abdominal tenderness. There is no guarding or rebound.  Musculoskeletal:        General: No tenderness.     Comments: 2+ DP pulses bilaterally.  Erythema to left thigh.  Scattered ulcerations to BLE.    Skin:    General: Skin is warm and dry.  Neurological:     Mental Status: She is alert and oriented to person, place, and time.  Psychiatric:        Behavior: Behavior normal.     ED Results / Procedures / Treatments   Labs (all labs ordered are listed, but only abnormal results are displayed) Labs Reviewed  BASIC METABOLIC PANEL - Abnormal; Notable for the following components:      Result Value   Sodium 129 (*)    Potassium 5.4 (*)    CO2 15 (*)    Glucose, Bld 184 (*)    BUN 55 (*)    Creatinine, Ser 2.03 (*)    Calcium 6.2 (*)    GFR calc non Af Amer 24 (*)    GFR calc Af Amer 28 (*)    All other components within normal limits  CBC - Abnormal; Notable for the following components:   WBC 24.5 (*)    Hemoglobin 11.9 (*)    MCHC 29.4 (*)    RDW 22.7 (*)    nRBC 0.3 (*)    All other components within normal limits  HEPATIC FUNCTION PANEL - Abnormal; Notable for the following components:   Albumin 3.0 (*)    Alkaline Phosphatase 152 (*)    Total Bilirubin 2.7 (*)    Bilirubin, Direct 1.0 (*)    Indirect Bilirubin 1.7 (*)    All other components within normal limits  LACTIC ACID, PLASMA - Abnormal; Notable for the following components:   Lactic Acid, Venous 2.2 (*)    All other components within normal limits  LACTIC ACID, PLASMA - Abnormal; Notable for the following components:   Lactic Acid, Venous 2.2 (*)    All other components within normal limits  BRAIN NATRIURETIC PEPTIDE - Abnormal; Notable for the following components:   B Natriuretic Peptide 912.0 (*)    All other components  within normal limits  HEMOGLOBIN A1C - Abnormal; Notable for the following components:   Hgb A1c MFr Bld 7.2 (*)    All other components within normal limits  I-STAT CHEM 8, ED - Abnormal; Notable for the following components:   Sodium 130 (*)    BUN 55 (*)    Creatinine, Ser 2.20 (*)    Glucose, Bld 138 (*)    Calcium, Ion 0.70 (*)    TCO2 19 (*)    All other components within normal limits  CBG MONITORING, ED - Abnormal; Notable for the following components:   Glucose-Capillary 150 (*)    All other components within normal limits  I-STAT VENOUS BLOOD GAS, ED - Abnormal; Notable for the following components:   pH, Ven 7.248 (*)    pO2, Ven 110.0 (*)    Acid-base deficit 7.0 (*)    Sodium 131 (*)    Calcium, Ion 0.73 (*)    All other components within normal limits  TROPONIN I (HIGH SENSITIVITY) - Abnormal; Notable for the following components:   Troponin I (High Sensitivity) 22 (*)    All other components within normal limits  TROPONIN I (HIGH SENSITIVITY) - Abnormal; Notable for the following components:   Troponin I (High Sensitivity) 24 (*)    All other components within normal limits  SARS CORONAVIRUS 2 BY RT PCR (HOSPITAL ORDER, [facility])  CULTURE, BLOOD (ROUTINE X 2)  CULTURE, BLOOD (ROUTINE X 2)  MRSA PCR SCREENING  URINALYSIS, ROUTINE W REFLEX MICROSCOPIC  BLOOD GAS, VENOUS  CALCIUM, IONIZED  PROTIME-INR  CORTISOL-AM, BLOOD  PROCALCITONIN  COMPREHENSIVE METABOLIC PANEL  CBC WITH DIFFERENTIAL/PLATELET  MAGNESIUM    EKG EKG Interpretation  Date/Time:  Saturday March 17 2020 18:59:04 EDT Ventricular Rate:  137 PR Interval:    QRS Duration: 104 QT Interval:  342 QTC Calculation: 516 R Axis:   121 Text Interpretation: Atrial fibrillation Right axis deviation Possible Right ventricular hypertrophy Septal infarct , age undetermined T wave abnormality, consider inferior ischemia Abnormal ECG Confirmed by Quintella Reichert [phone]) on  03/18/2020 12:51:01 AM   Radiology DG Chest 2 View  Result Date: 03/24/2020 CLINICAL DATA:  Chest pain EXAM: CHEST - 2 VIEW COMPARISON:  12/04/2019, PET CT 12/22/2019 FINDINGS: Post sternotomy changes with left atrial appendage clip. Left-sided pacing device as before. Cardiomegaly with mildly enlarged central pulmonary vessels. No overt edema. No consolidation, pleural effusion, or pneumothorax. Moderate compression deformity of mid to lower thoracic vertebra, new or increased as compared to chest CT from May 2021. IMPRESSION: Cardiomegaly without overt failure. New mild to moderate compression deformity of mid to lower thoracic vertebra. Electronically Signed   By: Donavan Foil M.D.   On: 03/09/2020 20:18  Procedures Procedures (including critical care time) CRITICAL CARE Performed by: Quintella Reichert   Total critical care time: 35 minutes  Critical care time was exclusive of separately billable procedures and treating other patients.  Critical care was necessary to treat or prevent imminent or life-threatening deterioration.  Critical care was time spent personally by me on the following activities: development of treatment plan with patient and/or surrogate as well as nursing, discussions with consultants, evaluation of patient's response to treatment, examination of patient, obtaining history from patient or surrogate, ordering and performing treatments and interventions, ordering and review of laboratory studies, ordering and review of radiographic studies, pulse oximetry and re-evaluation of patient's condition.  Medications Ordered in ED Medications  insulin aspart (novoLOG) injection 0-15 Units (has no administration in time range)  lactated ringers bolus 1,000 mL (1,000 mLs Intravenous New Bag/Given 03/18/20 0224)    Followed by  lactated ringers infusion (has no administration in time range)  calcium gluconate 2 g/ 100 mL sodium chloride IVPB (has no administration in time  range)  vancomycin (VANCOREADY) IVPB 1500 mg/300 mL (has no administration in time range)  meropenem (MERREM) 1 g in sodium chloride 0.9 % 100 mL IVPB (has no administration in time range)  vancomycin (VANCOREADY) IVPB 1250 mg/250 mL (has no administration in time range)  oxyCODONE (OXYCONTIN) 12 hr tablet 20 mg (has no administration in time range)  amiodarone (PACERONE) tablet 100 mg (has no administration in time range)  apixaban (ELIQUIS) tablet 5 mg (has no administration in time range)  acetaminophen (TYLENOL) tablet 650 mg (has no administration in time range)    Or  acetaminophen (TYLENOL) suppository 650 mg (has no administration in time range)  polyethylene glycol (MIRALAX / GLYCOLAX) packet 17 g (has no administration in time range)  ondansetron (ZOFRAN) tablet 8 mg (has no administration in time range)    Or  ondansetron (ZOFRAN) 8 mg in sodium chloride 0.9 % 50 mL IVPB (has no administration in time range)    ED Course  I have reviewed the triage vital signs and the nursing notes.  Pertinent labs & imaging results that were available during my care of the patient were reviewed by me and considered in my medical decision making (see chart for details).    MDM Rules/Calculators/A&P                          Patient with history of a fib, breast cancer here for evaluation of multiple complaints. She is chronically ill appearing on evaluation in a fib with RVR. She was treated with diltiazem for rate control. She is volume overloaded on examination but has acute kidney injury. Examination is concerning for potential cellulitis in the left lower extremity and she was started on antibiotics. Discussed with patient findings of studies and recommendation for admission and she is in agreement treatment plan. Hospitalist consulted for admission. Final Clinical Impression(s) / ED Diagnoses Final diagnoses:  None    Rx / DC Orders ED Discharge Orders    None       Quintella Reichert, MD 03/18/20 [phone]

## 2020-03-18 NOTE — Consult Note (Signed)
[facility] Nurse Consult Note: Reason for Consult: Patient reports that she used a pumice stone to exfoliate her lower extremities. Now with cellulitis. Wound type: trauma, infectious Pressure Injury POA: N/A Measurement: Scattered full and partial thickness open areas to anterior aspects of bilateral LEs. Wound bed: red, dry. Some dried serum (scabbing) Drainage (amount, consistency, odor) Small amount of serous, patient states this is much improved over previously with elevation. Periwound: erythema, mild edeam Dressing procedure/placement/frequency: I have provided Nursing with guidance for daily topical care using a soap and water cleanse, rinse and patting dry. This will be followed by covering the lesions with a nonadherent antimicrobial, xeroform and topping with with ABD pads. The dressings are to be secured with toe-to-knee kerlix roll gauze/paper tape. We will continue for 7 days.  [facility] nursing team will not follow, but will remain available to this patient, the nursing and medical teams.  Please re-consult if needed. Thanks, Maudie Flakes, MSN, RN, [place], Arther Abbott  Pager# [phone]

## 2020-03-19 ENCOUNTER — Encounter: Payer: Self-pay | Admitting: *Deleted

## 2020-03-19 DIAGNOSIS — C50912 Malignant neoplasm of unspecified site of left female breast: Secondary | ICD-10-CM

## 2020-03-19 DIAGNOSIS — C7951 Secondary malignant neoplasm of bone: Secondary | ICD-10-CM

## 2020-03-19 DIAGNOSIS — Z515 Encounter for palliative care: Secondary | ICD-10-CM

## 2020-03-19 DIAGNOSIS — A419 Sepsis, unspecified organism: Principal | ICD-10-CM

## 2020-03-19 DIAGNOSIS — F329 Major depressive disorder, single episode, unspecified: Secondary | ICD-10-CM

## 2020-03-19 DIAGNOSIS — Z7189 Other specified counseling: Secondary | ICD-10-CM

## 2020-03-19 DIAGNOSIS — I5042 Chronic combined systolic (congestive) and diastolic (congestive) heart failure: Secondary | ICD-10-CM

## 2020-03-19 DIAGNOSIS — L0390 Cellulitis, unspecified: Secondary | ICD-10-CM

## 2020-03-19 DIAGNOSIS — I4891 Unspecified atrial fibrillation: Secondary | ICD-10-CM

## 2020-03-19 LAB — CBC WITH DIFFERENTIAL/PLATELET
Abs Immature Granulocytes: 0.17 10*3/uL — ABNORMAL HIGH (ref 0.00–0.07)
Basophils Absolute: 0 10*3/uL (ref 0.0–0.1)
Basophils Relative: 0 %
Eosinophils Absolute: 0.2 10*3/uL (ref 0.0–0.5)
Eosinophils Relative: 1 %
HCT: 36.8 % (ref 36.0–46.0)
Hemoglobin: 11.2 g/dL — ABNORMAL LOW (ref 12.0–15.0)
Immature Granulocytes: 1 %
Lymphocytes Relative: 2 %
Lymphs Abs: 0.5 10*3/uL — ABNORMAL LOW (ref 0.7–4.0)
MCH: 26.4 pg (ref 26.0–34.0)
MCHC: 30.4 g/dL (ref 30.0–36.0)
MCV: 86.8 fL (ref 80.0–100.0)
Monocytes Absolute: 0.8 10*3/uL (ref 0.1–1.0)
Monocytes Relative: 4 %
Neutro Abs: 19.4 10*3/uL — ABNORMAL HIGH (ref 1.7–7.7)
Neutrophils Relative %: 92 %
Platelets: 257 10*3/uL (ref 150–400)
RBC: 4.24 MIL/uL (ref 3.87–5.11)
RDW: 22.5 % — ABNORMAL HIGH (ref 11.5–15.5)
WBC: 21.1 10*3/uL — ABNORMAL HIGH (ref 4.0–10.5)
nRBC: 0.4 % — ABNORMAL HIGH (ref 0.0–0.2)

## 2020-03-19 LAB — MAGNESIUM: Magnesium: 2.5 mg/dL — ABNORMAL HIGH (ref 1.7–2.4)

## 2020-03-19 LAB — GLUCOSE, CAPILLARY
Glucose-Capillary: 62 mg/dL — ABNORMAL LOW (ref 70–99)
Glucose-Capillary: 181 mg/dL — ABNORMAL HIGH (ref 70–99)
Glucose-Capillary: 117 mg/dL — ABNORMAL HIGH (ref 70–99)
Glucose-Capillary: 155 mg/dL — ABNORMAL HIGH (ref 70–99)
Glucose-Capillary: 186 mg/dL — ABNORMAL HIGH (ref 70–99)

## 2020-03-19 LAB — BASIC METABOLIC PANEL
Anion gap: 16 — ABNORMAL HIGH (ref 5–15)
BUN: 53 mg/dL — ABNORMAL HIGH (ref 8–23)
CO2: 16 mmol/L — ABNORMAL LOW (ref 22–32)
Calcium: 6.2 mg/dL — CL (ref 8.9–10.3)
Chloride: 100 mmol/L (ref 98–111)
Creatinine, Ser: 1.9 mg/dL — ABNORMAL HIGH (ref 0.44–1.00)
GFR calc Af Amer: 30 mL/min — ABNORMAL LOW (ref >60)
GFR calc non Af Amer: 26 mL/min — ABNORMAL LOW (ref >60)
Glucose, Bld: 67 mg/dL — ABNORMAL LOW (ref 70–99)
Potassium: 5.4 mmol/L — ABNORMAL HIGH (ref 3.5–5.1)
Sodium: 132 mmol/L — ABNORMAL LOW (ref 135–145)

## 2020-03-19 LAB — PHOSPHORUS: Phosphorus: 4.3 mg/dL (ref 2.5–4.6)

## 2020-03-19 LAB — LACTIC ACID, PLASMA: Lactic Acid, Venous: 1.8 mmol/L (ref 0.5–1.9)

## 2020-03-19 MED ORDER — AMIODARONE HCL 200 MG PO TABS
200.0000 mg | ORAL_TABLET | Freq: Every day | ORAL | Status: DC
  Administered 2020-03-20 – 2020-04-06 (×17): 200 mg via ORAL
  Filled 2020-03-19 (×18): qty 1

## 2020-03-19 MED ORDER — MORPHINE SULFATE ER 15 MG PO TBCR
15.0000 mg | EXTENDED_RELEASE_TABLET | Freq: Two times a day (BID) | ORAL | Status: DC
  Administered 2020-03-19 – 2020-04-02 (×30): 15 mg via ORAL
  Filled 2020-03-19 (×30): qty 1

## 2020-03-19 MED ORDER — MIRTAZAPINE 15 MG PO TBDP
7.5000 mg | ORAL_TABLET | Freq: Every day | ORAL | Status: DC
  Administered 2020-03-19 – 2020-04-07 (×19): 7.5 mg via ORAL
  Filled 2020-03-19 (×20): qty 0.5

## 2020-03-19 MED ORDER — DEXTROSE 50 % IV SOLN
12.5000 g | INTRAVENOUS | Status: AC
  Administered 2020-03-19: 12.5 g via INTRAVENOUS
  Filled 2020-03-19: qty 50

## 2020-03-19 MED ORDER — CALCIUM GLUCONATE-NACL 2-0.675 GM/100ML-% IV SOLN
2.0000 g | Freq: Once | INTRAVENOUS | Status: AC
  Administered 2020-03-19: 2000 mg via INTRAVENOUS
  Filled 2020-03-19: qty 100

## 2020-03-19 MED ORDER — SODIUM BICARBONATE 8.4 % IV SOLN
25.0000 meq | Freq: Once | INTRAVENOUS | Status: AC
  Administered 2020-03-19: 25 meq via INTRAVENOUS
  Filled 2020-03-19: qty 50
  Filled 2020-03-19: qty 25

## 2020-03-19 MED ORDER — SODIUM CHLORIDE 0.9 % IV SOLN
INTRAVENOUS | Status: DC | PRN
  Administered 2020-03-19: 500 mL via INTRAVENOUS

## 2020-03-19 MED ORDER — OXYCODONE HCL 5 MG PO TABS
5.0000 mg | ORAL_TABLET | ORAL | Status: DC | PRN
  Administered 2020-03-20 (×2): 5 mg via ORAL
  Filled 2020-03-19 (×2): qty 1

## 2020-03-19 MED ORDER — CALCIUM GLUCONATE-NACL 2-0.675 GM/100ML-% IV SOLN: 2.0000 g | Freq: Once | INTRAVENOUS | Status: DC

## 2020-03-19 NOTE — Progress Notes (Addendum)
Pt requested Oxycodone 5 mg per her home regimen PRN for break through lower back pain. She also had persistent nausea and vomiting with clear emesis and unable to have PO intake adequately. Notified Dr. Marlowe Sax, on-call provider. Order received for Oxycodone PRN and no IV fluid recommened due to underlying of CHF. Will continue to monitor.  Kennyth Lose, RN

## 2020-03-19 NOTE — Progress Notes (Signed)
Patient refused Prevalon boots. Educated patient on the reason for the boots. Pt states she is not immobile and she can move her feet around. Boots are in the room, if patient changes her mind.  [name]

## 2020-03-19 NOTE — Progress Notes (Signed)
Hypoglycemic standing order initiated after CBG checked at 06:28 = 62 mg/dl, due to poor oral in take from nausea and vomiting, 50% dextrose 12.5 mg IV given. We rechecked again CBG 181 mg/dl. Pt's hemodynamically stable , Atrial fibrillation on monitor, HR 110-125, BP with in normal limits. Remained afebrile, SPO2 98% on room air. We will continue to monitor.   Results for TISA, WEISEL (MRN [id number]) as of 03/19/2020 07:49  Ref. Range 03/18/2020 21:25 03/19/2020 01:51 03/19/2020 06:28 03/19/2020 07:04  Glucose-Capillary Latest Ref Range: 70 - 99 mg/dL 87  62 (L) 181 (H)    Kennyth Lose, RN

## 2020-03-19 NOTE — Progress Notes (Signed)
Sent page to provider V. Rathore MD. Advised of critical lab. Calcium 6.2  Lacretia Leigh RN

## 2020-03-19 NOTE — Progress Notes (Signed)
PROGRESS NOTE  [name]  DOB: 28-Jan-1946  PCP: Patient, No Pcp Per RRN:[id number]  DOA: 03/05/2020  LOS: 1 day   Chief Complaint  Patient presents with  . Chest Pain    Brief narrative: [name] is a 74 y.o. 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 fib, ventricular tachycardia status post ICD in situ, breast cancer with bone mets, Lynch syndrome,  Patient presented to the ED on 03/16/2020 with complaint of shortness of breath, worsening redness on both upper legs with weeping lesions. 2 weeks prior to presentation.  Patient had an exfoliative scrub of her bilateral lower extremities.  In subsequent few days, patient started having bilateral lower extremity redness, pain and weeping lesions extending up to the thigh associated with pain worsening on ambulation.  She also reported chest pain and shortness of breath which he apparently has since her diagnosis of breast cancer in May.  In the ED, patient was afebrile, she was in A. fib with RVR to 120s, blood pressure stable, breathing on room air. Labs showed WBC count elevated to 24.5, lactic acid 2.2, sodium low at 129, potassium elevated to 5.4, BUN/creatinine 55/2.03, serum bicarb low at 15,  Chest x-ray showed cardiomegaly without overt failure. New mild to moderate compression deformity of mid to lower thoracic vertebra.  Patient was admitted to hospital service for sepsis secondary to bilateral lower extremity cellulitis.  Subjective: Patient was seen and examined this morning. Significantly nauseated.  Patient states he has chronic nausea at home and is worse in the morning. Improving bilateral leg cellulitis and swelling  Continues to remain in A. fib with RVR between 100 and 120. Blood pressure in 100s. Labs today with sodium 132, potassium 5.4, creatinine down to 1.9, calcium low at 6.2, magnesium at 2.5, phosphorus 4.3, lactic down to 1.8, WBC  improving down to 21.  Glucose level was low at 67 this morning.  Assessment/Plan: Sepsis -POA Immunocompromised status Bilateral lower extremity cellulitis -Presented with worsening redness, pain, weeping lesions of both lower extremities. -Met sepsis criteria with tachycardia, tachypnea, leukocytosis, lactic acidosis and source. -Blood culture sent. -Because of her immunocompromised status, we will continue the patient IV vancomycin and IV meropenem.  -Clinically bilateral leg cellulitis is improving -Currently on LR at 75 mill per hour.  Remains euvolemic. -no fever in last 24 hours.  WBC count improving. Repeat lactic acid this morning show improvement to normal. Recent Labs  Lab 03/05/2020 1905 03/18/20 0354 03/19/20 0151  WBC 24.5* 23.0* 21.1*   Recent Labs  Lab 03/18/20 0135 03/18/20 0232 03/18/20 0354 03/19/20 0151  LATICACIDVEN 2.2* 2.2*  --  1.8  PROCALCITON  --   --  0.59  --    Acute kidney injury on CKD 3a -Baseline creatinine seems to be between 1-1.3 although with wide fluctuations noted in chart reviewed. -Presented with creatinine elevated to 2.03.  Gradually improving.  Continue IV fluid. -Continue to monitor. Recent Labs    12/10/19 0646 12/19/19 1550 12/26/19 1113 01/10/20 1153 01/26/20 1330 02/21/20 1200 03/05/2020 1905 03/05/2020 2225 03/18/20 0354 03/19/20 0151  CREATININE 2.19* 1.25* 1.79* 2.12* 1.33* 1.01* 2.03* 2.20* 2.08* 1.90*   Hyponatremia -Remains in the range of 130 to 135. Recent Labs  Lab 03/08/2020 1905 04/02/2020 2225 03/18/20 0241 03/18/20 0354 03/19/20 0151  NA 129* 130* 131* 130* 132*   Hypocalcemia -Calcium level continues to remain low at 6.2 today. -We will order for  calcium gluconate replacement IV.  Chronic hyperkalemia -Patient states that her potassium level has remained elevated since November 2020.  She is not on any regular use of potassium lowering agents.  Potassium level elevated to 5.4 this morning.  Continue to  monitor.  A. fib with RVR H/o paroxysmal A. Fib s/p left atrial appendage clip History of V. Tach, ICD in situ.  -Presented with RVR, likely precipitated by sepsis. -Home med list noted amiodarone 100 mg daily which she was resumed on the same.  Patient tells me today that she stopped taking it a long time ago because of nausea -Patient continues to remain in A. fib with RVR. -Cardiology consulted. -Continue Eliquis for anticoagulation.  Chronic combined CHF/ HTN -EF 35-40% 12/2019, follows with Dr. Haroldine Laws as an outpatient. -Home meds include Lasix 80 mg daily and losartan 25 mg daily. -Currently both on hold because of sepsis and AKI. -Continue to monitor.  CAD s/p CABG; HLD -On Eliquis.  I do not see statin in the home list  Diabetes mellitus type 2 -A1c 7.2 on 8/15. Hypoglycemia -On glipizide 5 mg daily at home. -Currently on sliding scale insulin with Accu-Cheks.   -Blood sugar level was low at 67 this morning.  Oral appetite encouraged.  Continue to monitor.  Breast cancer with bone mets, Lynch syndrome,  -Breast cancer diagnosed in May.  Lynch syndrome diagnosed by genetic study.   -Follows up with Dr. Marin Olp as an outpatient.  Currently not on Ibrance.  Takes infusion periodically. -Chest x-ray on admission showed mild to moderate compression deformity of mid to lower thoracic vertebra. -Continue pain control.  Chronic nausea -Reports chronic daily nausea secondary to cancer, chemoradiation. -On as needed Zofran.  Goals of care -Patient states she lives with her daughter who has mental illness and her grandson [name] who is her power of attorney.  She has had discussion with them about her comorbidities, poor prognosis.  She really wishes to stop all her care and go to hospice.  However her grandson/POA is not in agreement with that and hence patient is continuing treatment. -Palliative care consultation called this morning.  Mobility: Uses walker at home.  PT eval  ordered. Code Status:   Code Status: Full Code  Nutritional status: Body mass index is 28 kg/m.     Diet Order            Diet heart healthy/carb modified Room service appropriate? Yes; Fluid consistency: Thin  Diet effective now                 DVT prophylaxis:  apixaban (ELIQUIS) tablet 5 mg   Antimicrobials:  IV meropenem, IV vancomycin Fluid: LR at 54  Consultants: None Family Communication:  None at bedside  Status is: Inpatient  Remains inpatient appropriate because:Hemodynamically unstable, Persistent severe electrolyte disturbances, Ongoing diagnostic testing needed not appropriate for outpatient work up and IV treatments appropriate due to intensity of illness or inability to take PO   Dispo: The patient is from: Home              Anticipated d/c is to: Home              Anticipated d/c date is: > 3 days              Patient currently is not medically stable to d/c.  Infusions:  . sodium chloride 500 mL (03/19/20 0418)  . meropenem (MERREM) IV 1 g (03/19/20 0930)  . ondansetron (ZOFRAN) IV    . [  START ON 03/20/2020] vancomycin      Scheduled Meds: . amiodarone  100 mg Oral Daily  . apixaban  5 mg Oral BID  . insulin aspart  0-15 Units Subcutaneous TID AC & HS  . morphine  15 mg Oral Q12H    Antimicrobials: Anti-infectives (From admission, onward)   Start     Dose/Rate Route Frequency Ordered Stop   03/20/20 0600  vancomycin (VANCOREADY) IVPB 1250 mg/250 mL     Discontinue     1,250 mg 166.7 mL/hr over 90 Minutes Intravenous Every 48 hours 03/18/20 0230     03/18/20 0330  vancomycin (VANCOREADY) IVPB 1500 mg/300 mL        1,500 mg 150 mL/hr over 120 Minutes Intravenous  Once 03/18/20 0230 03/18/20 0803   03/18/20 0330  meropenem (MERREM) 1 g in sodium chloride 0.9 % 100 mL IVPB     Discontinue     1 g 200 mL/hr over 30 Minutes Intravenous 2 times daily 03/18/20 0230     03/18/20 0130  piperacillin-tazobactam (ZOSYN) IVPB 3.375 g  Status:   Discontinued        3.375 g 100 mL/hr over 30 Minutes Intravenous  Once 03/18/20 0115 03/18/20 0214      PRN meds: sodium chloride, acetaminophen **OR** acetaminophen, ondansetron **OR** ondansetron (ZOFRAN) IV, polyethylene glycol   Objective: Vitals:   03/19/20 0600 03/19/20 0754  BP: 118/79 122/70  Pulse:  (!) 117  Resp: 19 18  Temp:  97.7 F (36.5 C)  SpO2:  96%    Intake/Output Summary (Last 24 hours) at 03/19/2020 1036 Last data filed at 03/19/2020 0600 Gross per 24 hour  Intake 238.18 ml  Output 500 ml  Net -261.82 ml   Filed Weights   03/18/20 0423 03/18/20 0523  Weight: 77.1 kg 74 kg   Weight change:  Body mass index is 28 kg/m.   Physical Exam: General exam: Appears calm and comfortable. Mild to moderate distress because of persistent nausea Skin: No rashes, lesions or ulcers. HEENT: Atraumatic, normocephalic, supple neck, no obvious bleeding Lungs: Clear to auscultation bilaterally CVS: A. fib with RVR, no murmur GI/Abd soft, nontender, nondistended, bowel sound present CNS: Alert, awake, oriented x3 Psychiatry: Mood appropriate Extremities: Redness and swelling due to bilateral lower extremity cellulitis improving.    Data Review: I have personally reviewed the laboratory data and studies available.  Recent Labs  Lab 03/13/2020 1905 03/13/2020 2225 03/18/20 0241 03/18/20 0354 03/19/20 0151  WBC 24.5*  --   --  23.0* 21.1*  NEUTROABS  --   --   --  21.4* 19.4*  HGB 11.9* 15.0 15.0 12.0 11.2*  HCT 40.5 44.0 44.0 39.3 36.8  MCV 89.6  --   --  88.5 86.8  PLT 279  --   --  290 257   Recent Labs  Lab 04/03/2020 1905 03/24/2020 2225 03/18/20 0241 03/18/20 0354 03/19/20 0151  NA 129* 130* 131* 130* 132*  K 5.4* 5.0 5.1 5.1 5.4*  CL 99 98  --  98 100  CO2 15*  --   --  18* 16*  GLUCOSE 184* 138*  --  139* 67*  BUN 55* 55*  --  56* 53*  CREATININE 2.03* 2.20*  --  2.08* 1.90*  CALCIUM 6.2*  --   --  6.1* 6.2*  MG  --   --   --  1.9 2.5*  PHOS  --    --   --   --  4.3  Lab Results  Component Value Date   HGBA1C 7.2 (H) 03/18/2020       Component Value Date/Time   CHOL 104 12/05/2019 0442   CHOL 183 07/05/2019 1137   TRIG 90 12/05/2019 0442   HDL 25 (L) 12/05/2019 0442   HDL 45 07/05/2019 1137   CHOLHDL 4.2 12/05/2019 0442   VLDL 18 12/05/2019 0442   LDLCALC 61 12/05/2019 0442   LDLCALC 110 (H) 07/05/2019 1137   LABVLDL 28 07/05/2019 1137   Signed, Terrilee Croak, MD [facility] Pager: [phone] (Secure Chat preferred). 03/19/2020

## 2020-03-19 NOTE — Progress Notes (Deleted)
Note on wrong patient 

## 2020-03-19 NOTE — Progress Notes (Signed)
Palliative:   Brief note- full note to follow-   Met with patient, her grandson arrived and the very end.  Patient with Stage IV breast cancer, currently s/p one round of nivolumab, has had radiation tx to spine, previously on Ibrance but did not tolerate; CHF with EF 35-40%, atrial fib/flutter- [employer] ICD in place, HTN, DM2, CKD III.  Currently admitted with sepsis secondary to bilateral lower extremity cellulitis. Palliative consulted for goals of care.   We discussed her code status and symptom management- pain is currently controlled, c/o poor appetite, insomnia, and anxiety/depression r/t to her chronic illness and family stressors.   Plan-  -DNR per her expressed wishes- I also discussed this with her Grandson -Mirtazipine- 7.29m QHS for sleep, depression, and appetite stimulant.  KMariana Kaufman AGNP-C Palliative Medicine  Please call Palliative Medicine team phone with any questions [phone] For individual providers please see AMION.  No charge note- see full consult

## 2020-03-19 NOTE — Progress Notes (Signed)
PT Cancellation Note  Patient Details Name: MACKENSI MAHADEO MRN: [id number] DOB: 08-04-1946   Cancelled Treatment:    Reason Eval/Treat Not Completed: Medical issues which prohibited therapy.  Lab values for Ca+ outside parameters for PT.  Retry at another time.   Ramond Dial 03/19/2020, 12:36 PM   Mee Hives, PT MS Acute Rehab Dept. Number: [place] [facility] [place]

## 2020-03-19 NOTE — Progress Notes (Signed)
I asked the patient several times throughout the shift if she needed to urinate and she stated no. Patient did not urinate during the entire shift and still denies any need to urinate this morning. 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 She stated she does not need to at this time.   Day shift nurse has been made aware that the patient has not urinated.  Lacretia Leigh RN

## 2020-03-19 NOTE — Consult Note (Addendum)
Cardiology Consultation:   Patient ID: MEKALA WINGER MRN: [id number]; DOB: 1945/09/22  Admit date: 03/13/2020 Date of Consult: 03/19/2020  Primary Care Provider: Patient, No Pcp Per [place] Cardiologist: Dr. Quillian Quince Kassondra Geil/ Dr. Dani Gobble Croitoru Loda Electrophysiologist:  None    Patient Profile:   [name] is a 74 y.o. female with a history of CAD s/p CABG in 2018, chronic combined CHF with apical hypertrophic cardiomyopathy with EF of 35-40%, paroxysmal atrial fibrillation/flutter s/p MAZE and LAA clip at time of CABG on Eliquis, VT/VF s/p St. Jude ICD in 04/2015, hypertension, type 2 diabetes mellitus, CKD stage III, and breast cancer with bone mets and Lynch syndrome, who was admitted with sepsis secondary to bilateral lower extremity cellulitis. Cardiology consulted for further evaluation of atrial fibrillation at the request of Dr. Pietro Cassis.  History of Present Illness:   [name] is a 74 year old female with the above history who is followed by Dr. Haroldine Laws.   Patient had prolonged hospital stay from 11/27/2016 to 01/08/2017 after VF shocked appropriately by ICD. She underwent cardiac catheterization at that time which showed severe 3 vessel disease. She ultimately underwent CABG x4 with MAZE and left atrial appendage clip at the same time. Post-operatively, she developed atrial fibrillation with RVR and low output heart failure. Initial CO-OX was 36%. She was placed on Amiodarone and converted to normal sinus rhythm. She was started on Milrinone which was unable to be weaned off successfully due to recurrent low output CHF. Echo showed LVEF of 45-50% with mild RV hypokinesis. RHC was performed on Milrinone and showed primarily RV failure. There was suspicion for amyloid. SPEP was negative and PYP scan was negative for TTR amyloid. She was discharged on Milrinone which was ultimately able to be weaned off in 03/2017.   She was admitted in 12/2019 with volume  overload and atrial fibrillation with RVR. She was also diagnosed with metastatic breast cancer with metastasis to vertebrae. She was diuresed with IV Lasix and placed on Amiodarone drip. She underwent successful TEE/DCCV. TEE showed small vegetation on RA lead but blood cultures were negative.  Patient was last seen by Darrick Grinder, PA, in the [facility] on 01/30/2020 at which time was feeling very poorly with complaints of nausea, vomiting, and difficulty getting out of bed due to back pain. She noted mild shortness of breath with exertion but denied any orthopnea or PND. She had stopped taking her Lasix and Losartan due to nausea. She was noted to be back in rate controlled atrial fibrillation and was restarted on Amiodarone 159m daily. Palliative Care was offered and she was open to this but stated she was not sure if she would answer the phone. Patient states Lasix was increased to [id number] that visit but note state she was reluctant to resume this.  Patient presented to the ED on 03/15/2020 via EMS with chest pain and increased lower extremity edema. In the ED, she was noted to be tachycardic and tachypneic and found to have leukocytosis 24 concerning for sepsis felt to be secondary to bilateral lower extremity cellulitis. Blood cultures were drawn and she was started on IV antibiotics and IV fluids. Cardiology consulted today for assistance with management of atrial fibrillation with RVR.  At the time of this evaluation, patient resting comfortably in bed. She notes bilateral lower extremity edema/weeping started about 2 weeks ago after using an exfoliating scrub and has since progressed and become more painful. Prior to this, she did report some  baseline lower extremity edema for which she has been taking Lasix 59m daily. She has some chronic lower extremity edema mainly with activity but states this has been stable. She sleeps on an incline but states this is due to pain not breathing. No orthopnea or  PND. She has some constant pain in her left flank/breast which sounds like has been present since radiation. Not worse with exertion. Worse when taking a deep breath. No significant palpitations. She notes occasional lightheadedness/dizziness usually with position changes but no syncope. She has basically constant nausea and has a lot of vomiting. Decreased PO intake for this. The only thing that she has been able to find that she can keep down is a soy shake.   Past Medical History:  Diagnosis Date  . Abnormal PFT   . Breast cancer metastasized to bone, unspecified laterality ([place] 12/06/2019  . CAD (coronary artery disease), native coronary artery - 3 vessel 04/21/2015   a. NSTEMI 8-04/2015 felt 2/2 demand ischemia. b. 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  . Chronic diastolic CHF (congestive heart failure) ([facility]   . Chronic systolic CHF (congestive heart failure) ([facility] 10/02/2015  . Diabetes mellitus ([place]   . Family history of breast cancer   . Goals of care, counseling/discussion 12/06/2019  . Hypertrophic cardiomyopathy ([facility]   . Left atrial enlargement   . Lynch syndrome   . Mitral regurgitation    a. Echo 04/2015: moderate mitral regurgitation. b. F/u echo 05/2015: mild MR.  . NSTEMI (non-ST elevated myocardial infarction) ([facility] 11/27/2016  . Paroxysmal atrial fibrillation ([name])   . Tricuspid regurgitation    a. Echo 04/2015: Mod-severe TR. b. Not mentioned on echo 05/2015  . Ventricular tachycardia ([facility] [place]    a. s/p St. Jude ICD implanted 04/2015.    Past Surgical History:  Procedure Laterality Date  . APPLICATION OF WOUND VAC Left 01/06/2013   Procedure: APPLICATION OF WOUND VAC X 2;  Surgeon: GMeredith Pel MD;  [facility];  Service: Orthopedics;  Laterality: Left;  left forearm  . APPLICATION OF WOUND VAC Left 01/09/2013   Procedure: APPLICATION OF WOUND VAC;  Surgeon: JJessy Oto MD;  [facility];  Service:  Orthopedics;  Laterality: Left;  . BREAST BIOPSY Left 12/07/2019    [place] breast mass core biopsy    . CARDIAC CATHETERIZATION N/A 04/19/2015   Procedure: Left Heart Cath and Coronary Angiography;  Surgeon: JJettie Booze MD;  [facility];  Service: Cardiovascular;  Laterality: N/A;  . CARDIOVERSION N/A 09/20/2015   Procedure: CARDIOVERSION;  Surgeon: KPixie Casino MD;  [facility];  Service: Cardiovascular;  Laterality: N/A;  . CARDIOVERSION N/A 12/09/2019   Procedure: CARDIOVERSION;  Surgeon: BJolaine Artist MD;  [facility];  Service: Cardiovascular;  Laterality: N/A;  . CLIPPING OF ATRIAL APPENDAGE Left 12/04/2016   Procedure: CLIPPING OF ATRIAL APPENDAGE USING ATRICURE 418ATRICLIP;  Surgeon: SMelrose Nakayama MD;  [place]  Service: Open Heart Surgery;  Laterality: Left;  . CORONARY ARTERY BYPASS GRAFT N/A 12/04/2016   Procedure: CORONARY ARTERY BYPASS GRAFTING (CABG)x 4 WITH ENDOSCOPIC HARVESTING OF RIGHT SAPHENOUS VEIN;  Surgeon: SMelrose Nakayama MD;  [place]  Service: Open Heart Surgery;  Laterality: N/A;  . DENTAL SURGERY  Oct. 2011   several extractions, bone graft  . EP IMPLANTABLE DEVICE N/A 04/16/2015   Procedure: ICD Implant;  Surgeon: GEvans Lance  MD;  Location: [place] [facility];  Service: Cardiovascular;  Laterality: N/A;  . I & D EXTREMITY Left 01/06/2013   Procedure: IRRIGATION AND DEBRIDEMENT LEFT ELBOW AND LEFT FOREARM ;  Surgeon: [name], MD;  [facility];  Service: Orthopedics;  Laterality: Left;  . INCISION AND DRAINAGE Left 01/09/2013   Procedure: REDO INCISION AND DRAINAGE LEFT ELBOW;  Surgeon: [name], MD;  [facility];  Service: Orthopedics;  Laterality: Left;  SUPINE, UPPER EXTERMITY DRAPE  . INTRAOPERATIVE TRANSESOPHAGEAL ECHOCARDIOGRAM N/A 12/04/2016   Procedure: INTRAOPERATIVE TRANSESOPHAGEAL ECHOCARDIOGRAM;  Surgeon: Melrose Nakayama, MD;  Location: [place];   Service: Open Heart Surgery;  Laterality: N/A;  . IR THORACENTESIS ASP PLEURAL SPACE W/IMG GUIDE  12/22/2016  . IR US GUIDE BX ASP/DRAIN  12/07/2019  . LEFT HEART CATH AND CORONARY ANGIOGRAPHY N/A 11/28/2016   Procedure: Left Heart Cath and Coronary Angiography;  Surgeon: Belva Crome, MD;  Location: [place] [facility];  Service: Cardiovascular;  Laterality: N/A;  . MAZE N/A 12/04/2016   Procedure: MAZE PROCEDURE;  Surgeon: Melrose Nakayama, MD;  Location: [facility];  Service: Open Heart Surgery;  Laterality: N/A;  . RIGHT HEART CATH N/A 01/02/2017   Procedure: Right Heart Cath;  Surgeon: Jolaine Artist, MD;  Location: [place] [facility] LAB;  Service: Cardiovascular;  Laterality: N/A;  . SECONDARY CLOSURE OF WOUND Left 01/09/2013   Procedure: SECONDARY CLOSURE OF WOUND  LEFT ELBOW;  Surgeon: [name], MD;  [facility];  Service: Orthopedics;  Laterality: Left;  . TEE WITHOUT CARDIOVERSION N/A 09/20/2015   Procedure: TRANSESOPHAGEAL ECHOCARDIOGRAM (TEE);  Surgeon: Pixie Casino, MD;  Location: [place];  Service: Cardiovascular;  Laterality: N/A;  . TEE WITHOUT CARDIOVERSION N/A 12/09/2019   Procedure: TRANSESOPHAGEAL ECHOCARDIOGRAM (TEE);  Surgeon: Jolaine Artist, MD;  [facility];  Service: Cardiovascular;  Laterality: N/A;     Home Medications:  Prior to Admission medications   Medication Sig Start Date End Date Taking? Authorizing Provider  acetaminophen (TYLENOL) 500 MG tablet Take 500-1,000 mg by mouth every 6 (six) hours as needed for headache (pain).   Yes [provider]  Blood Glucose Monitoring Suppl (TRUE METRIX METER) DEVI 1 each by Does not apply route 2 (two) times daily. 04/27/15  Yes Newlin, Enobong, MD  ELIQUIS 5 MG TABS tablet TAKE ONE TABLET BY MOUTH TWICE A DAY Patient taking differently: Take 5 mg by mouth 2 (two) times daily.  01/26/20  Yes Lael Pilch, Shaune Pascal, MD  furosemide (LASIX) 80 MG tablet Take 1 tablet (80 mg total) by mouth daily.  12/19/19  Yes Clegg, Amy D, NP  glipiZIDE (GLUCOTROL) 5 MG tablet Take 1 tablet (5 mg total) by mouth daily. 06/16/16  Yes Charlott Rakes, MD  glucose blood (TRUE METRIX BLOOD GLUCOSE TEST) test strip Use as instructed twice daily 04/27/15  Yes Newlin, Enobong, MD  ondansetron (ZOFRAN) 4 MG tablet Take 1 tablet (4 mg total) by mouth every 4 (four) hours as needed. Patient taking differently: Take 4 mg by mouth in the morning, at noon, and at bedtime.  03/15/20  Yes Volanda Napoleon, MD  ondansetron (ZOFRAN-ODT) 8 MG disintegrating tablet Take 1 tablet (8 mg total) by mouth every 8 (eight) hours as needed for nausea or vomiting. 01/23/20  Yes Eppie Gibson, MD  oxyCODONE (OXY IR/ROXICODONE) 5 MG immediate release tablet Take 1 tablet (5 mg total) by mouth every 6 (six) hours as needed for moderate pain  or severe pain. Patient taking differently: Take 5 mg by mouth every 4 (four) hours as needed for moderate pain or severe pain.  02/29/20  Yes Volanda Napoleon, MD  oxyCODONE (OXYCONTIN) 20 mg 12 hr tablet Take 1 tablet (20 mg total) by mouth every 12 (twelve) hours. 03/15/20  Yes Volanda Napoleon, MD  TRUEPLUS LANCETS 28G MISC Use as instructed twice daily. Patient taking differently: 1 each by Other route in the morning and at bedtime.  04/27/15  Yes Newlin, Charlane Ferretti, MD  palbociclib (IBRANCE) 100 MG tablet Take 1 tablet (100 mg total) by mouth daily. Take for 21 days on, 7 days off, repeat every 28 days. Patient not taking: Reported on 03/18/2020 12/28/19   Volanda Napoleon, MD    Inpatient Medications: Scheduled Meds: . amiodarone  100 mg Oral Daily  . apixaban  5 mg Oral BID  . insulin aspart  0-15 Units Subcutaneous TID AC & HS  . morphine  15 mg Oral Q12H   Continuous Infusions: . sodium chloride 500 mL (03/19/20 0418)  . meropenem (MERREM) IV 1 g (03/19/20 0930)  . ondansetron (ZOFRAN) IV    . [START ON 03/20/2020] vancomycin     PRN Meds: sodium chloride, acetaminophen **OR** acetaminophen,  ondansetron **OR** ondansetron (ZOFRAN) IV, polyethylene glycol  Allergies:    Allergies  Allergen Reactions  . Levaquin [Levofloxacin] Other (See Comments)    Suicidal thoughts, extreme anxiety, panic attack  . Tramadol Other (See Comments)    Fuzzy thinking and agitation per patient  . Amiodarone     Severe nausea  . Metoprolol     Drops blood pressure, falls and passes out  . Phenergan [Promethazine Hcl] Other (See Comments)    Restless    Social History:   Social History   Socioeconomic History  . Marital status: Divorced    Spouse name: Not on file  . Number of children: Not on file  . Years of education: Not on file  . Highest education level: Not on file  Occupational History  . Not on file  Tobacco Use  . Smoking status: Former Smoker    Years: 4.00    Quit date: 12/31/1980    Years since quitting: 39.2  . Smokeless tobacco: Never Used  Vaping Use  . Vaping Use: Never used  Substance and Sexual Activity  . Alcohol use: No  . Drug use: No  . Sexual activity: Not Currently  Other Topics Concern  . Not on file  Social History Narrative   Divorced.  Lives with daughter and grandson. 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   Social Determinants of Health   Financial Resource Strain:   . Difficulty of Paying Living Expenses:   Food Insecurity:   . Worried About Charity fundraiser in the Last Year:   . Arboriculturist in the Last Year:   Transportation Needs:   . Film/video editor (Medical):   [name] Lack of Transportation (Non-Medical):   Physical Activity:   . Days of Exercise per Week:   . Minutes of Exercise per Session:   Stress:   . Feeling of Stress :   Social Connections:   . Frequency of Communication with Friends and Family:   . Frequency of Social Gatherings with Friends and Family:   . Attends Religious Services:   . Active Member of Clubs or Organizations:   . Attends Archivist Meetings:   [name] Marital Status:  Intimate  Partner Violence:   . Fear of Current or Ex-Partner:   . Emotionally Abused:   [name] Physically Abused:   . Sexually Abused:     Family History:    Family History  Problem Relation Age of Onset  . Breast cancer Mother 34  . Pulmonary embolism Mother   . Diabetes Father   . Stroke Father   . Heart disease Sister   . Healthy Sister   . Gastric cancer Maternal Uncle   . Prostate cancer Maternal Uncle      ROS:  Please see the history of present illness.  All other ROS reviewed and negative.     Physical Exam/Data:   Vitals:   03/19/20 0401 03/19/20 0600 03/19/20 0754 03/19/20 1221  BP: (!) 105/94 118/79 122/70 107/70  Pulse: (!) 116  (!) 117 (!) 124  Resp: (!) 21 19 18 18   Temp: 98.5 F (36.9 C)  97.7 F (36.5 C) 97.6 F (36.4 C)  TempSrc: Oral  Oral Oral  SpO2: 94%  96% 93%  Weight:      Height:        Intake/Output Summary (Last 24 hours) at 03/19/2020 1404 Last data filed at 03/19/2020 0600 Gross per 24 hour  Intake 238.18 ml  Output 500 ml  Net -261.82 ml   Last 3 Weights 03/18/2020 03/18/2020 02/21/2020  Weight (lbs) 163 lb 2.3 oz 170 lb (No Data)  Weight (kg) 74 kg 77.111 kg (No Data)  Some encounter information is confidential and restricted. Go to Review Flowsheets activity to see all data.     Body mass index is 28 kg/m.  General: 74 y.o. female resting comfortably in no acute distress. HEENT: Normocephalic and atraumatic. Sclera clear.  Neck: Supple. No carotid bruits. No JVD. Heart: Tachycardic with irregularly irregular rhythm. Distinct S1 and S2. No significant murmurs, gallops, or rubs. Radial and distal pedal pulses 2+ and equal bilaterally. Lungs: No increased work of breathing. Possible mild crackles in right base but difficult to assess patient was not able to sit up for exam. Abdomen: Soft, non-distended, and mild tenderness to palpation. Bowel sounds present. MSK: Generalized weakness age. Extremities: 1+ lower extremity edema. Bilateral lower  extremities wrapped in gauze.   Skin: Warm and dry. Neuro: Alert and oriented x3. No focal deficits. Psych: Normal affect. Responds appropriately.   EKG:  The EKG was personally reviewed and demonstrates: Atrial fibrillation, rate 137 bpm, with some T wave abnormalities in leads III and aVF and and mild down sloping St depression/biphasic T waves in V6.  Telemetry:  Telemetry was personally reviewed and demonstrates: Atrial fibrillation with rates ranging mostly from 110's to 120's but as high as the 130's at times. Occasional PVCs.  Relevant CV Studies:  TTE 12/06/2019: Impressions: 1. Left ventricular ejection fraction, by estimation, is 35 to 40%. The  left ventricle has moderately decreased function. The left ventricle  demonstrates regional wall motion abnormalities (see scoring  diagram/findings for description). There is severe  asymmetric left ventricular hypertrophy. Left ventricular diastolic  function could not be evaluated.  2. Right ventricular systolic function is low normal. The right  ventricular size is normal. There is severely elevated pulmonary artery  systolic pressure.  3. Left atrial size was severely dilated.  4. Right atrial size was mildly dilated.  5. The mitral valve is degenerative. Mild mitral valve regurgitation. No  evidence of mitral stenosis.  6. Tricuspid valve regurgitation is moderate.  7. The aortic valve is bicuspid. Aortic valve  regurgitation is not  visualized. Mild aortic valve sclerosis is present, with no evidence of  aortic valve stenosis.  8. The inferior vena cava is dilated in size with <50% respiratory  variability, suggesting right atrial pressure of 15 mmHg.   Conclusion(s)/Recommendation(s): EF has decreased since prior Echo. _______________  Right Heart Catheterization 01/02/2017: Findings: Done on milrinone 0.59mg/kg/min  RA = 10  RV = 40/15 PA = 42/15 (28) PCW = 16 Fick cardiac output/index = 6.1/3.3 Thermo  CO/CI = 5.0/2.7 PVR = 2.4 FA sat = 96% PA sat = 63%. 64%  Assessment: 1. Mild pulmonary HTN with mildly elevated right-sided heart pressures 2. Normal PCWP after diuresis 3. Normal cardiac output on low-dose milrinone  Plan/Discussion: Suspect major issue is post-op RV failure. Will wean milrinone to off. Start low-dose sildenafil. Change to po lasix.  _______________  Left Heart Catheterization 11/28/2016:  Severe three-vessel native coronary disease with chronic total occlusion of the RCA supplied by left-to-right collaterals. Inferior wall branches are graftable.  Total occlusion of the mid circumflex with retrograde filling of 2 large obtuse marginal branches. Branches appear graftable.  Severe LAD and diagonal disease with subtotal occlusion in the mid vessel of the LAD. 90% stenosis in the proximal portion of large diagonal.  Elevated left ventricular end-diastolic pressure during tachycardia.  Sustained, hemodynamically stable ventricular tachycardia during the procedure that required pace termination via the AICD.  Recommendations:  Assessment of LV function by CT and/or echo with specific attention to the presence absence of an LV aneurysm involving the inferior wall.  TCTS notified of need for CABG and ? Aneurysm resection if present.   Laboratory Data:  High Sensitivity Troponin:   Recent Labs  Lab 03/16/2020 1905 03/08/2020 2206  TROPONINIHS 22* 24*     Chemistry Recent Labs  Lab 03/05/2020 1905 03/11/2020 1905 04/01/2020 2225 03/09/2020 2225 03/18/20 0241 03/18/20 0354 03/19/20 0151  NA 129*   < > 130*   < > 131* 130* 132*  K 5.4*   < > 5.0   < > 5.1 5.1 5.4*  CL 99   < > 98  --   --  98 100  CO2 15*  --   --   --   --  18* 16*  GLUCOSE 184*   < > 138*  --   --  139* 67*  BUN 55*   < > 55*  --   --  56* 53*  CREATININE 2.03*   < > 2.20*  --   --  2.08* 1.90*  CALCIUM 6.2*  --   --   --   --  6.1* 6.2*  GFRNONAA 24*  --   --   --   --  23* 26*  GFRAA 28*   --   --   --   --  27* 30*  ANIONGAP 15  --   --   --   --  14 16*   < > = values in this interval not displayed.    Recent Labs  Lab 03/12/2020 2206 03/18/20 0354  PROT 6.9 6.5  ALBUMIN 3.0* 2.9*  AST 38 24  ALT 22 24  ALKPHOS 152* 154*  BILITOT 2.7* 2.0*   Hematology Recent Labs  Lab 03/07/2020 1905 03/24/2020 2225 03/18/20 0241 03/18/20 0354 03/19/20 0151  WBC 24.5*  --   --  23.0* 21.1*  RBC 4.52  --   --  4.44 4.24  HGB 11.9*   < > 15.0 12.0 11.2*  HCT 40.5   < > 44.0 39.3 36.8  MCV 89.6  --   --  88.5 86.8  MCH 26.3  --   --  27.0 26.4  MCHC 29.4*  --   --  30.5 30.4  RDW 22.7*  --   --  22.7* 22.5*  PLT 279  --   --  290 257   < > = values in this interval not displayed.   BNP Recent Labs  Lab 03/18/20 0229  BNP 912.0*    DDimer No results for input(s): DDIMER in the last 168 hours.   Radiology/Studies:  DG Chest 2 View  Result Date: 03/26/2020 CLINICAL DATA:  Chest pain EXAM: CHEST - 2 VIEW COMPARISON:  12/04/2019, PET CT 12/22/2019 FINDINGS: Post sternotomy changes with left atrial appendage clip. Left-sided pacing device as before. Cardiomegaly with mildly enlarged central pulmonary vessels. No overt edema. No consolidation, pleural effusion, or pneumothorax. Moderate compression deformity of mid to lower thoracic vertebra, new or increased as compared to chest CT from May 2021. IMPRESSION: Cardiomegaly without overt failure. New mild to moderate compression deformity of mid to lower thoracic vertebra. Electronically Signed   By: Donavan Foil M.D.   On: 03/16/2020 20:18    New York Heart Association (NYHA) Functional Class NYHA Class III  Assessment and Plan:   Atrial Fibrillation with RVR - S/p MAZE and LAA clipping at time of CABG in 11/2016. S/p DCCV in 09/2015 and more recently 12/09/2019. Noted to be back in atrial fibrillation at last office visit on 01/30/2020 and started on low dose PO Amiodarone. - Presented in atrial fibrillation with RVR in the  setting of sepsis secondary to cellulitis of bilateral lower extremities. - Rates still mildly uncontrolled in the 110's to 120's.  - Not been able to tolerate Amiodarone due to severe nausea. - Not able to tolerate beta-blocker due to hypotension with syncope. - Calcium channel blockers not a good option with reduced EF. - Digoxin may not be an option with current AKI.  - Will discuss rate control options with MD. - Continue Eliquis 67m twice daily.   Chronic Combined CHF - History of ischemic cardiomyopathy. PYP in the past negative for amyloid. - Most recent Echo in 12/2019 showed LVEF of 35-40% with severe asymmetric LVH, mild MR, moderate TR, and severely elevated PASP. - Chest x-ray showed cardiomegaly with mildly enlarged central pulmonary vessel but no overt edema.  - Home Lasix on hold and patient actually given some IV fluids on admission due to sepsis. Does not appear to be significantly volume overloaded on exam. May need to restart home Lasix. - Patient previously stopped Losartan.  - Continue to monitor volume status closely.  CAD  - S/p CABG x4 in 11/2016.  - High-sensitivity troponin minimally elevated and flat at 22 >> 24. Not consistent with ACS. Suspect demand ischemia in setting of sepsis and atrial fibrillation with RVR. - She reports constant left sided flank/breast pain that is worse with inspiration for the last several months. Sounds like this may be secondary to radiation. Nothing that sounds like angina. - No Aspirin with need for Eliquis.  - Continue statin.  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 Had VF arrest in 11/2016 with appropriate ICD shock.  - No recurrent VT/VF.  - Had not been able to tolerate Amiodarone or beta-blocker in the past.  Sepsis Secondary to Bilateral Lower Extremity Cellulitis - Presented with worsening redness, pain, weeping lesions, and lower extremity  edema of bilateral lower extremities. Met sepsis criteria with tachycardia,  tachypnea, leukocytosis, and lactic acidosis. - Blood cultures negative to date. - Started on fluids and IV Vancomycin and IV Meropenem given immunocompromised status. WBC slowly trending down. Lactic acid normalized. Symptomatically feeling better. - Management per primary team.  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 - Diagnosed in 12/2019. Lynch syndrome diagnosed by genetic study.  - Chest x-ray showed mild to moderate compression deformity of mid to lower thoracic vertebrae. - Follows with Dr. Marin Olp as outpatient.   Chronic Hyperkalemia - Potassium 5.4 today. Commonly this high over the last couple of months.  - Not on any potassium supplements or medications that cause hyperkalemia. - Continue to monitor closely.  Type 2 Diabetes Mellitus - Management per primary team.  Acute on CKD  - Creatinine 2.20 on admission. Mildly improved to 1.9 today. Baseline seems to be in 1.0 to 1.30 range. - Likely due to sepsis and deceased PO intake with constant nausea and frequent vomiting. - Continue to monitor closely.    For questions or updates, please contact [place] Please consult [email or web address] for contact info under    Signed, Eppie Gibson  03/19/2020 2:04 PM  Patient seen and examined with the above-signed Advanced Practice Provider and/or Housestaff. I personally reviewed laboratory data, imaging studies and relevant notes. I independently examined the patient and formulated the important aspects of the plan. I have edited the note to reflect any of my changes or salient points. I have personally discussed the plan with the patient and/or family.  74 y/o woman with recently diagnosed metastatic breast CA (undergoing XRT), CAD s/p CABG, DM2, systolic HF with EF 78-58% and PAF.   Admitted with sepsis and lactic acidosis due to severe LE cellulitis in setting of marked volume overload. Course complicated by AF with RVR. Has tolerated only low-dose  amio in past (100 mg daily) - unable to tolerate higher doses due to nausea. Has also been unable to tolerate many of her HF meds due to SEs.  General:  Sitting up on side of bed weak appearing , HEENT: normal Neck: supple. JVP to jaw  Carotids 2+ bilat; no bruits. No lymphadenopathy or thryomegaly appreciated. Cor: PMI nondisplaced. Irregular tachy  Lungs: decreased at basese Abdomen: soft, nontender, nondistended. No hepatosplenomegaly. No bruits or masses. Good bowel sounds. Extremities: no cyanosis, clubbing, rash, 3+ edema  LLE>RLE. Wrapped with gauze Neuro: alert & orientedx3, cranial nerves grossly intact. moves all 4 extremities w/o difficulty. Affect pleasant  Difficult situation complicated by patient's intolerance to multiple cardiac meds. Agree with broad spectrum abx. Would place TED hose to help with edema and I stressed need to keep legs elevated. Check LE u/s to evaluate for DVT given marked asymmetry on exam (L>>R) - could have pelvic clot. Will need to start IV diuresis soon. Ideally would use IV amio for AF but she cannot tolerate. Will start amio 200 daily. Continue Eliquis. We will follow.   Glori Bickers, MD  11:12 PM

## 2020-03-19 NOTE — Progress Notes (Signed)
Inpatient Diabetes Program Recommendations  AACE/ADA: New Consensus Statement on Inpatient Glycemic Control (2015)  Target Ranges:  Prepandial:   less than 140 mg/dL      Peak postprandial:   less than 180 mg/dL (1-2 hours)      Critically ill patients:  140 - 180 mg/dL   Lab Results  Component Value Date   GLUCAP 181 (H) 03/19/2020   HGBA1C 7.2 (H) 03/18/2020    Review of Glycemic Control Results for [name], [name] (MRN [id number]) as of 03/19/2020 12:18  Ref. Range 03/18/2020 11:39 03/18/2020 16:07 03/18/2020 21:25 03/19/2020 06:28 03/19/2020 07:04  Glucose-Capillary Latest Ref Range: 70 - 99 mg/dL 156 (H) Novolog 3 units 101 (H) 87 62 (L) 181 (H)   Diabetes history: DM2 Outpatient Diabetes medications: Glucotrol 5 mg qd Current orders for Inpatient glycemic control: Novolog 0-15 units correction tid with meals & hs  Inpatient Diabetes Program Recommendations:   -Decrease Novolog correction to very sensitive 0-6 units tid Secure chat to Dr. Pietro Cassis.  Thank you, [name]. Kayla Elias, RN, MSN, CDE  Diabetes Coordinator Inpatient Glycemic Control Team Team Pager [phone] (8am-5pm) 03/19/2020 12:21 PM

## 2020-03-19 NOTE — Progress Notes (Signed)
Planning to call patient to follow up on discussion from Friday. She is currently admitted at [facility]. Updated Dr Marin Olp to patient condition. Will continue to follow.  Oncology Nurse Navigator Documentation  Oncology Nurse Navigator Flowsheets 03/19/2020  Abnormal Finding Date -  Confirmed Diagnosis Date -  Diagnosis Status -  Phase of Treatment -  Chemotherapy Actual Start Date: -  Radiation Actual Start Date: -  Radiation Actual End Date: -  Targeted Therapy Actual Start Date: -  Navigator Follow Up Date: -  Navigator Follow Up Reason: -  Production assistant, radio Encounter Type Appt/Treatment Plan Review  Telephone -  Treatment Initiated Date -  Patient Visit Type MedOnc  Treatment Phase Active Tx  Barriers/Navigation Needs Anxiety;Coordination of Care;Family Concerns;Mobility Issues;Pain;Personal Conflicts;Transportation;Education  Education -  Interventions -  Acuity Level 4-High Needs (Greater Than 4 Barriers Identified)  Referrals -  Coordination of Care -  Education Method -  Support Groups/Services -  Time Spent with Patient 30

## 2020-03-20 ENCOUNTER — Inpatient Hospital Stay (HOSPITAL_COMMUNITY): Payer: PPO

## 2020-03-20 DIAGNOSIS — R609 Edema, unspecified: Secondary | ICD-10-CM

## 2020-03-20 DIAGNOSIS — Z515 Encounter for palliative care: Secondary | ICD-10-CM

## 2020-03-20 DIAGNOSIS — Z7189 Other specified counseling: Secondary | ICD-10-CM

## 2020-03-20 DIAGNOSIS — F329 Major depressive disorder, single episode, unspecified: Secondary | ICD-10-CM

## 2020-03-20 LAB — BASIC METABOLIC PANEL
Anion gap: 9 (ref 5–15)
BUN: 52 mg/dL — ABNORMAL HIGH (ref 8–23)
CO2: 23 mmol/L (ref 22–32)
Calcium: 6.5 mg/dL — ABNORMAL LOW (ref 8.9–10.3)
Chloride: 101 mmol/L (ref 98–111)
Creatinine, Ser: 1.69 mg/dL — ABNORMAL HIGH (ref 0.44–1.00)
GFR calc Af Amer: 35 mL/min — ABNORMAL LOW (ref >60)
GFR calc non Af Amer: 30 mL/min — ABNORMAL LOW (ref >60)
Glucose, Bld: 138 mg/dL — ABNORMAL HIGH (ref 70–99)
Potassium: 4.7 mmol/L (ref 3.5–5.1)
Sodium: 133 mmol/L — ABNORMAL LOW (ref 135–145)

## 2020-03-20 LAB — CBC WITH DIFFERENTIAL/PLATELET
Abs Immature Granulocytes: 0.2 10*3/uL — ABNORMAL HIGH (ref 0.00–0.07)
Basophils Absolute: 0 10*3/uL (ref 0.0–0.1)
Basophils Relative: 0 %
Eosinophils Absolute: 0.3 10*3/uL (ref 0.0–0.5)
Eosinophils Relative: 1 %
HCT: 36.5 % (ref 36.0–46.0)
Hemoglobin: 11.1 g/dL — ABNORMAL LOW (ref 12.0–15.0)
Immature Granulocytes: 1 %
Lymphocytes Relative: 3 %
Lymphs Abs: 0.5 10*3/uL — ABNORMAL LOW (ref 0.7–4.0)
MCH: 26.4 pg (ref 26.0–34.0)
MCHC: 30.4 g/dL (ref 30.0–36.0)
MCV: 86.9 fL (ref 80.0–100.0)
Monocytes Absolute: 0.8 10*3/uL (ref 0.1–1.0)
Monocytes Relative: 5 %
Neutro Abs: 16.2 10*3/uL — ABNORMAL HIGH (ref 1.7–7.7)
Neutrophils Relative %: 90 %
Platelets: 229 10*3/uL (ref 150–400)
RBC: 4.2 MIL/uL (ref 3.87–5.11)
RDW: 22.9 % — ABNORMAL HIGH (ref 11.5–15.5)
WBC: 18.1 10*3/uL — ABNORMAL HIGH (ref 4.0–10.5)
nRBC: 0.3 % — ABNORMAL HIGH (ref 0.0–0.2)

## 2020-03-20 LAB — GLUCOSE, CAPILLARY
Glucose-Capillary: 95 mg/dL (ref 70–99)
Glucose-Capillary: 111 mg/dL — ABNORMAL HIGH (ref 70–99)
Glucose-Capillary: 114 mg/dL — ABNORMAL HIGH (ref 70–99)
Glucose-Capillary: 107 mg/dL — ABNORMAL HIGH (ref 70–99)

## 2020-03-20 LAB — CALCIUM, IONIZED: Calcium, Ionized, Serum: 3.3 mg/dL — ABNORMAL LOW (ref 4.5–5.6)

## 2020-03-20 MED ORDER — FUROSEMIDE 10 MG/ML IJ SOLN
80.0000 mg | Freq: Once | INTRAMUSCULAR | Status: AC
  Administered 2020-03-20: 80 mg via INTRAVENOUS
  Filled 2020-03-20: qty 8

## 2020-03-20 MED ORDER — SODIUM CHLORIDE 0.9 % IV SOLN
2.0000 g | INTRAVENOUS | Status: AC
  Administered 2020-03-20 – 2020-03-25 (×6): 2 g via INTRAVENOUS
  Filled 2020-03-20 (×4): qty 2
  Filled 2020-03-20: qty 20
  Filled 2020-03-20: qty 2

## 2020-03-20 MED ORDER — HYDROMORPHONE HCL 1 MG/ML IJ SOLN: 3.0000 mg | INTRAMUSCULAR | Status: DC | PRN

## 2020-03-20 MED ORDER — ZOLEDRONIC ACID 4 MG/5ML IV CONC
4.0000 mg | Freq: Once | INTRAVENOUS | Status: AC
  Administered 2020-03-20: 4 mg via INTRAVENOUS
  Filled 2020-03-20: qty 5

## 2020-03-20 MED ORDER — HYDROMORPHONE HCL 1 MG/ML IJ SOLN
2.0000 mg | INTRAMUSCULAR | Status: DC | PRN
  Administered 2020-03-22 – 2020-03-30 (×7): 2 mg via INTRAVENOUS
  Filled 2020-03-20 (×8): qty 2

## 2020-03-20 MED ORDER — KETOROLAC TROMETHAMINE 15 MG/ML IJ SOLN
30.0000 mg | Freq: Three times a day (TID) | INTRAMUSCULAR | Status: DC
  Administered 2020-03-20: 30 mg via INTRAVENOUS
  Filled 2020-03-20: qty 2

## 2020-03-20 MED ORDER — CALCIUM GLUCONATE-NACL 2-0.675 GM/100ML-% IV SOLN
2.0000 g | Freq: Once | INTRAVENOUS | Status: AC
  Administered 2020-03-20: 2000 mg via INTRAVENOUS
  Filled 2020-03-20: qty 100

## 2020-03-20 MED ORDER — MORPHINE SULFATE 15 MG PO TABS
15.0000 mg | ORAL_TABLET | ORAL | Status: DC | PRN
  Administered 2020-03-22 – 2020-04-04 (×11): 15 mg via ORAL
  Filled 2020-03-20 (×12): qty 1

## 2020-03-20 NOTE — Consult Note (Addendum)
Consultation Note Date: 03/20/2020   Patient Name: [name]  DOB: [birth year]  MRN: [id number]  Age / Sex: 74 y.o., female  PCP: Patient, No Pcp Per Referring Physician: Terrilee Croak, MD  Reason for Consultation: Establishing goals of care  HPI/Patient Profile: 74 y.o. female Patient with Stage IV breast cancer, currently s/p one round of nivolumab, has had radiation tx to spine, previously on Ibrance but did not tolerate; CHF with EF 35-40%, atrial fib/flutter- St. Jude ICD in place, HTN, DM2, CKD III.  Currently admitted on 03/23/2020 with sepsis secondary to bilateral lower extremity cellulitis. Admission complicated by afib with RVR- intolerant to many cardiac medications.  Palliative consulted for goals of care.   Clinical Assessment and Goals of Care:  I have reviewed medical records including EPIC notes, labs and imaging,  examined the patient and met at bedside with patient and at the very end of our meeting- her Grandson  to discuss diagnosis prognosis, GOC, EOL wishes, disposition and options.  I introduced Palliative Medicine as specialized medical care for people living with serious illness. It focuses on providing relief from the symptoms and stress of a serious illness.   We discussed a brief life review of the patient. She is a retired [employer] scientist. She has a daughter who is a great stressor in her life and a Grandson who provides a great amount of support. She lives at home with her [name].   As far as functional and nutritional status- she requires assistance at times. She utilizes a walker for ambulation. Prepares her own food, but this tires her easily. Depends on her Grandson for help.  We discussed her current illness and what it means in the larger context of her on-going co-morbidities.  Natural disease trajectory and expectations at EOL were discussed. Although she knows that her breast  cancer is treatable and mainly a chronic problem for now- she understands that she has multiple comorbidities that affect her quality and quantity of life- her heart failure, her chronic kidney failure and her cancer together. Quality of life is very important to her and limiting suffering is very important.  The difference between aggressive medical intervention and comfort care was explained per patient's request and the differences between Palliative and Hospice were also discussed.  Advanced directives, concepts specific to code status, artifical feeding and hydration, and rehospitalization were considered and discussed. [name] was very clear that she did not want resuscitation and wanted a DNR order- she worried that her [name] did not agree with this. I was able to discuss this with her [name]- and when put into context that a full code effort would not restore [name] to a better quality of life than where she currently is- [name] agreed with DNR order.   Questions and concerns were addressed.  The family was encouraged to call with questions or concerns.   Symptom management discussed- [name] has decreased appetite, just not wanting to eat, nothing tastes good. She also complains of insomnia associated with daytime fatigue. She  has depression related to her health state as well as family stressors. We discussed using mirtazapine 7.30m qhs nightly for symptom cluster as she agreed.   Pain wise- pain is primarily located in her back in areas of bone mets. She notes it was much better after receiving radiation. She feels well on current MS Contin and Oxy IR for breakthrough. Oxycontin made her nauseous. She may benefit from low dose decadron once her infection clears.    Primary Decision Maker PATIENT    SUMMARY OF RECOMMENDATIONS -DNR -Mirtazipine 7.5 mg qhs for sleep, depression, and appetite stimulant- will start inpatient- please continue to prescribe outpatient -Would  benefit from low dose dexamethasone in addition to current pain regimen (would also help with ongoing nausea and appetite loss)- 2-4 mg bid po for bone met pain once lower extremity cellulitis infection clears -Plan to followup tomorrow at 2pm with grandson and patient to complete HCPOA and Advanced Directives -Recommend outpatient Palliative followup  Code Status/Advance Care Planning:  DNR   Palliative Prophylaxis:   Frequent Pain Assessment  Prognosis:    Unable to determine  Discharge Planning: To Be Determined  Primary Diagnoses: Present on Admission: . Sepsis due to cellulitis ([facility] . Hypocalcemia . Cellulitis of left lower extremity . Paroxysmal atrial fibrillation with rapid ventricular response ([facility] [place] . Hyperkalemia . Dehydration with hyponatremia . Chronic systolic CH[facility] (congestive heart failure) [facility] . Lactic acidosis   I have reviewed the medical record, interviewed the patient and family, and examined the patient. The following aspects are pertinent.  Past Medical History:  Diagnosis Date  . Abnormal PFT   . Breast cancer metastasized to bone, unspecified laterality ([facility] 12/06/2019  . CAD (coronary artery disease), native coronary artery - 3 vessel 04/21/2015   a. NSTEMI 8-04/2015 felt 2/2 demand ischemia. b. 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  . Chronic diastolic CHF (congestive heart failure) ([facility]   . Chronic systolic CHF (congestive heart failure) ([facility] 10/02/2015  . Diabetes mellitus ([facility]   . Family history of breast cancer   . Goals of care, counseling/discussion 12/06/2019  . Hypertrophic cardiomyopathy ([facility]   . Left atrial enlargement   . Lynch syndrome   . Mitral regurgitation    a. Echo 04/2015: moderate mitral regurgitation. b. F/u echo 05/2015: mild MR.  . NSTEMI (non-ST elevated myocardial infarction) ([facility] 11/27/2016  . Paroxysmal atrial fibrillation ([name])   . Tricuspid regurgitation    a.  Echo 04/2015: Mod-severe TR. b. Not mentioned on echo 05/2015  . Ventricular tachycardia ([facility]    a. s/p St. Jude ICD implanted 04/2015.    Scheduled Meds: . amiodarone  200 mg Oral Daily  . apixaban  5 mg Oral BID  . insulin aspart  0-15 Units Subcutaneous TID AC & HS  . ketorolac  30 mg Intravenous Q8H  . mirtazapine  7.5 mg Oral QHS  . morphine  15 mg Oral Q12H   Continuous Infusions: . sodium chloride 500 mL (03/19/20 0418)  . meropenem (MERREM) IV 1 g (03/19/20 1946)  . ondansetron (ZOFRAN) IV    . vancomycin 1,250 mg (03/20/20 0522)  . zoledronic acid (ZOMETA) IV     PRN Meds:.sodium chloride, acetaminophen **OR** acetaminophen, HYDROmorphone (DILAUDID) injection, morphine, ondansetron **OR** ondansetron (ZOFRAN) IV, polyethylene glycol Medications Prior to Admission:  Prior to Admission medications   Medication Sig Start Date End Date Taking? Authorizing Provider  acetaminophen (TYLENOL) 500 MG tablet Take 500-1,000 mg by mouth every  6 (six) hours as needed for headache (pain).   Yes [provider]  Blood Glucose Monitoring Suppl (TRUE METRIX METER) DEVI 1 each by Does not apply route 2 (two) times daily. 04/27/15  Yes Newlin, Enobong, MD  ELIQUIS 5 MG TABS tablet TAKE ONE TABLET BY MOUTH TWICE A DAY Patient taking differently: Take 5 mg by mouth 2 (two) times daily.  01/26/20  Yes Bensimhon, Shaune Pascal, MD  furosemide (LASIX) 80 MG tablet Take 1 tablet (80 mg total) by mouth daily. 12/19/19  Yes Clegg, Amy D, NP  glipiZIDE (GLUCOTROL) 5 MG tablet Take 1 tablet (5 mg total) by mouth daily. 06/16/16  Yes Charlott Rakes, MD  glucose blood (TRUE METRIX BLOOD GLUCOSE TEST) test strip Use as instructed twice daily 04/27/15  Yes Newlin, Enobong, MD  ondansetron (ZOFRAN) 4 MG tablet Take 1 tablet (4 mg total) by mouth every 4 (four) hours as needed. Patient taking differently: Take 4 mg by mouth in the morning, at noon, and at bedtime.  03/15/20  Yes Volanda Napoleon, MD    ondansetron (ZOFRAN-ODT) 8 MG disintegrating tablet Take 1 tablet (8 mg total) by mouth every 8 (eight) hours as needed for nausea or vomiting. 01/23/20  Yes Eppie Gibson, MD  oxyCODONE (OXY IR/ROXICODONE) 5 MG immediate release tablet Take 1 tablet (5 mg total) by mouth every 6 (six) hours as needed for moderate pain or severe pain. Patient taking differently: Take 5 mg by mouth every 4 (four) hours as needed for moderate pain or severe pain.  02/29/20  Yes Volanda Napoleon, MD  oxyCODONE (OXYCONTIN) 20 mg 12 hr tablet Take 1 tablet (20 mg total) by mouth every 12 (twelve) hours. 03/15/20  Yes Volanda Napoleon, MD  TRUEPLUS LANCETS 28G MISC Use as instructed twice daily. Patient taking differently: 1 each by Other route in the morning and at bedtime.  04/27/15  Yes Newlin, Charlane Ferretti, MD  palbociclib (IBRANCE) 100 MG tablet Take 1 tablet (100 mg total) by mouth daily. Take for 21 days on, 7 days off, repeat every 28 days. Patient not taking: Reported on 03/18/2020 12/28/19   Volanda Napoleon, MD   Allergies  Allergen Reactions  . Levaquin [Levofloxacin] Other (See Comments)    Suicidal thoughts, extreme anxiety, panic attack  . Tramadol Other (See Comments)    Fuzzy thinking and agitation per patient  . Amiodarone     Severe nausea  . Metoprolol     Drops blood pressure, falls and passes out  . Phenergan [Promethazine Hcl] Other (See Comments)    Restless   Review of Systems  Constitutional: Positive for activity change, appetite change and fatigue.  Musculoskeletal: Positive for back pain.    Physical Exam Vitals and nursing note reviewed.  Constitutional:      Appearance: Normal appearance.  HENT:     Head: Normocephalic and atraumatic.     Mouth/Throat:     Mouth: Mucous membranes are dry.     Pharynx: Oropharynx is clear.  Cardiovascular:     Rate and Rhythm: Tachycardia present.  Pulmonary:     Effort: Pulmonary effort is normal.  Skin:    Comments: BLE erythema   Neurological:     Mental Status: She is alert and oriented to person, place, and time.  Psychiatric:        Mood and Affect: Mood normal.        Behavior: Behavior normal.     Vital Signs: BP 115/73 (BP Location: Right Arm)  Pulse 99   Temp 97.6 F (36.4 C) (Oral)   Resp 19   Ht 5' 4" (1.626 m)   Wt 74 kg   SpO2 97%   BMI 28.00 kg/m  Pain Scale: 0-10   Pain Score: 9    SpO2: SpO2: 97 % O2 Device:SpO2: 97 % O2 Flow Rate: .   IO: Intake/output summary:   Intake/Output Summary (Last 24 hours) at 03/20/2020 0854 Last data filed at 03/20/2020 0522 Gross per 24 hour  Intake 470 ml  Output 1300 ml  Net -830 ml    LBM: Last BM Date: 03/16/20 Baseline Weight: Weight: 77.1 kg Most recent weight: Weight: 74 kg     Palliative Assessment/Data: PPS: 60%     Thank you for this consult. Palliative medicine will continue to follow and assist as needed.   Time In: 1530 Time Out: 1730 Time Total: 120 minutes Greater than 50%  of this time was spent counseling and coordinating care related to the above assessment and plan.  Signed by: Mariana Kaufman, AGNP-C Palliative Medicine    Please contact Palliative Medicine Team phone at [phone] for questions and concerns.  For individual provider: See Shea Evans

## 2020-03-20 NOTE — Progress Notes (Signed)
Patient unable to void and bladder scan 840 ml Dr. Cyd Silence text paged

## 2020-03-20 NOTE — Consult Note (Signed)
Referral MD  Reason for Referral: Metastatic Breast cancer -- bone mets  Chief Complaint  Patient presents with  . Chest Pain  : I have a lot of swelling and infection my legs.  HPI: [name] is well-known to me.  She is a very nice 74 year old white female.  She has metastatic breast cancer.  So far, her cancer has been limited to her bones.  She has estrogen positive breast cancer.  She has a Lynch syndrome.  As such, we are trying her on immunotherapy.  We cannot use HER-2 based therapy because of her cardiomyopathy.  She has had 1 dose of immunotherapy with nivolumab.  She had her first dose on 02/27/2020.  She was admitted a couple days ago because of cellulitis in her legs.  She has atrial fibrillation.  She has congestive heart failure.  She has a lot of edema in her legs.  Her cultures so far are negative.  She is on antibiotics with vancomycin and meropenem.  As always, her main problem has been the bony pain that she has.  She has bone mets.  She has had radiation therapy for this.  She has a lot of issues with her back.  This most is over on the left side under the scapula.  She has bad time tolerating OxyContin and oxycodone.  These cause a lot of nausea.  She is started on MS Contin which seemed to help her and she did not have nausea.  She has a lot of stress at home.  She is trying to manage this.  She is also on anti-estrogen therapy.  She is on Faslodex.  She cannot tolerate Ibrance.  Her last CA 27.29 came down from 2300 to 1040.[name]  She should respond well to immunotherapy given that she has the Lynch syndrome.  I would like to hope that her metastatic disease to the bones will improve.  Pain control is critical.  Hopefully, the MS Contin will be able to help her.  I will give her little Toradol.  I know she has a lot of inflammatory issues with pain etiology.  Appetite is okay.  She has had no diarrhea.  There is no cough.  The primary for breast cancer is in  the left breast.  This appears to be improving also.  Overall, her performance status right now is ECOG 2.    Past Medical History:  Diagnosis Date  . Abnormal PFT   . Breast cancer metastasized to bone, unspecified laterality ([place]) 12/06/2019  . CAD (coronary artery disease), native coronary artery - 3 vessel 04/21/2015   a. NSTEMI 8-04/2015 felt 2/2 demand ischemia. b. 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  . Chronic diastolic CHF (congestive heart failure) ([place])   . Chronic systolic CHF (congestive heart failure) ([place]) 10/02/2015  . Diabetes mellitus ([place])   . Family history of breast cancer   . Goals of care, counseling/discussion 12/06/2019  . Hypertrophic cardiomyopathy ([place])   . Left atrial enlargement   . Lynch syndrome   . Mitral regurgitation    a. Echo 04/2015: moderate mitral regurgitation. b. F/u echo 05/2015: mild MR.  . NSTEMI (non-ST elevated myocardial infarction) ([place]) 11/27/2016  . Paroxysmal atrial fibrillation ([name])   . Tricuspid regurgitation    a. Echo 04/2015: Mod-severe TR. b. Not mentioned on echo 05/2015  . Ventricular tachycardia ([place])    a. s/p St. Jude ICD implanted 04/2015.  :  Past  Surgical History:  Procedure Laterality Date  . APPLICATION OF WOUND VAC Left 01/06/2013   Procedure: APPLICATION OF WOUND VAC X 2;  Surgeon: [name], MD;  Location: [facility];  Service: Orthopedics;  Laterality: Left;  left forearm  . APPLICATION OF WOUND VAC Left 01/09/2013   Procedure: APPLICATION OF WOUND VAC;  Surgeon: [name], MD;  Location: [facility];  Service: Orthopedics;  Laterality: Left;  . BREAST BIOPSY Left 12/07/2019    US guided L breast mass core biopsy    . CARDIAC CATHETERIZATION N/A 04/19/2015   Procedure: Left Heart Cath and Coronary Angiography;  Surgeon: [name], MD;  Location: [place] [facility];  Service: Cardiovascular;  Laterality: N/A;  . CARDIOVERSION N/A 09/20/2015   Procedure:  CARDIOVERSION;  Surgeon: Pixie Casino, MD;  Location: [facility] ENDOSCOPY;  Service: Cardiovascular;  Laterality: N/A;  . CARDIOVERSION N/A 12/09/2019   Procedure: CARDIOVERSION;  Surgeon: Jolaine Artist, MD;  Location: [facility] ENDOSCOPY;  Service: Cardiovascular;  Laterality: N/A;  . CLIPPING OF ATRIAL APPENDAGE Left 12/04/2016   Procedure: CLIPPING OF ATRIAL APPENDAGE USING ATRICURE 69 ATRICLIP;  Surgeon: Melrose Nakayama, MD;  Location: [place];  Service: Open Heart Surgery;  Laterality: Left;  . CORONARY ARTERY BYPASS GRAFT N/A 12/04/2016   Procedure: CORONARY ARTERY BYPASS GRAFTING (CABG)x 4 WITH ENDOSCOPIC HARVESTING OF RIGHT SAPHENOUS VEIN;  Surgeon: Melrose Nakayama, MD;  Location: [place];  Service: Open Heart Surgery;  Laterality: N/A;  . DENTAL SURGERY  Oct. 2011   several extractions, bone graft  . EP IMPLANTABLE DEVICE N/A 04/16/2015   Procedure: ICD Implant;  Surgeon: [name], MD;  Location: [place] [facility];  Service: Cardiovascular;  Laterality: N/A;  . I & D EXTREMITY Left 01/06/2013   Procedure: IRRIGATION AND DEBRIDEMENT LEFT ELBOW AND LEFT FOREARM ;  Surgeon: [name], MD;  [facility]: [facility];  Service: Orthopedics;  Laterality: Left;  . INCISION AND DRAINAGE Left 01/09/2013   Procedure: REDO INCISION AND DRAINAGE LEFT ELBOW;  Surgeon: [name], MD;  [facility];  Service: Orthopedics;  Laterality: Left;  SUPINE, UPPER EXTERMITY DRAPE  . INTRAOPERATIVE TRANSESOPHAGEAL ECHOCARDIOGRAM N/A 12/04/2016   Procedure: INTRAOPERATIVE TRANSESOPHAGEAL ECHOCARDIOGRAM;  Surgeon: Melrose Nakayama, MD;  Location: [place];  Service: Open Heart Surgery;  Laterality: N/A;  . IR THORACENTESIS ASP PLEURAL SPACE W/IMG GUIDE  12/22/2016  . IR US GUIDE BX ASP/DRAIN  12/07/2019  . LEFT HEART CATH AND CORONARY ANGIOGRAPHY N/A 11/28/2016   Procedure: Left Heart Cath and Coronary Angiography;  Surgeon: Belva Crome, MD;  Location: [place] [facility];  Service: Cardiovascular;  Laterality:  N/A;  . MAZE N/A 12/04/2016   Procedure: MAZE PROCEDURE;  Surgeon: Melrose Nakayama, MD;  Location: [place];  Service: Open Heart Surgery;  Laterality: N/A;  . RIGHT HEART CATH N/A 01/02/2017   Procedure: Right Heart Cath;  Surgeon: Jolaine Artist, MD;  Location: [place] [facility];  Service: Cardiovascular;  Laterality: N/A;  . SECONDARY CLOSURE OF WOUND Left 01/09/2013   Procedure: SECONDARY CLOSURE OF WOUND  LEFT ELBOW;  Surgeon: [name], MD;  [facility];  Service: Orthopedics;  Laterality: Left;  . TEE WITHOUT CARDIOVERSION N/A 09/20/2015   Procedure: TRANSESOPHAGEAL ECHOCARDIOGRAM (TEE);  Surgeon: Pixie Casino, MD;  Location: [place];  Service: Cardiovascular;  Laterality: N/A;  . TEE WITHOUT CARDIOVERSION N/A 12/09/2019   Procedure: TRANSESOPHAGEAL ECHOCARDIOGRAM (TEE);  Surgeon: Jolaine Artist, MD;  Location: [facility] ENDOSCOPY;  Service: Cardiovascular;  Laterality: N/A;  :   Current Facility-Administered Medications:  .  0.9 %  sodium chloride infusion, , Intravenous, PRN, Dahal, Binaya, MD, Last Rate: 10 mL/hr at 03/19/20 0418, 500 mL at 03/19/20 0418 .  acetaminophen (TYLENOL) tablet 650 mg, 650 mg, Oral, Q6H PRN, 650 mg at 03/18/20 2138 **OR** acetaminophen (TYLENOL) suppository 650 mg, 650 mg, Rectal, Q6H PRN, Shalhoub, Sherryll Burger, MD .  amiodarone (PACERONE) tablet 200 mg, 200 mg, Oral, Daily, Clegg, Amy D, NP .  apixaban (ELIQUIS) tablet 5 mg, 5 mg, Oral, BID, Shalhoub, Sherryll Burger, MD, 5 mg at 03/19/20 2113 .  HYDROmorphone (DILAUDID) injection 3 mg, 3 mg, Intravenous, Q2H PRN, ,  R, MD .  insulin aspart (novoLOG) injection 0-15 Units, 0-15 Units, Subcutaneous, TID AC & HS, Shalhoub, Sherryll Burger, MD, 3 Units at 03/19/20 2144 .  ketorolac (TORADOL) 15 MG/ML injection 30 mg, 30 mg, Intravenous, Q8H, ,  R, MD .  meropenem (MERREM) 1 g in sodium chloride 0.9 % 100 mL IVPB, 1 g, Intravenous, BID, Shalhoub, Sherryll Burger, MD, Last Rate: 200 mL/hr at  03/19/20 1946, 1 g at 03/19/20 1946 .  mirtazapine (REMERON SOL-TAB) disintegrating tablet 7.5 mg, 7.5 mg, Oral, QHS, Mahan, Kasie J, NP, 7.5 mg at 03/19/20 2113 .  morphine (MS CONTIN) 12 hr tablet 15 mg, 15 mg, Oral, Q12H, Dahal, Binaya, MD, 15 mg at 03/19/20 2230 .  morphine (MSIR) tablet 15 mg, 15 mg, Oral, Q2H PRN, Volanda Napoleon, MD .  ondansetron (ZOFRAN) tablet 8 mg, 8 mg, Oral, Q8H PRN, 8 mg at 03/19/20 1420 **OR** ondansetron (ZOFRAN) 8 mg in sodium chloride 0.9 % 50 mL IVPB, 8 mg, Intravenous, Q8H PRN, Shalhoub, Sherryll Burger, MD .  polyethylene glycol (MIRALAX / GLYCOLAX) packet 17 g, 17 g, Oral, Daily PRN, Shalhoub, Sherryll Burger, MD .  vancomycin (VANCOREADY) IVPB 1250 mg/250 mL, 1,250 mg, Intravenous, Q48H, Shalhoub, Sherryll Burger, MD, Last Rate: 166.7 mL/hr at 03/20/20 0522, 1,250 mg at 03/20/20 0522 .  zolendronic acid (ZOMETA) 4 mg in sodium chloride 0.9 % 100 mL IVPB, 4 mg, Intravenous, Once, , Rudell Cobb, MD:  . amiodarone  200 mg Oral Daily  . apixaban  5 mg Oral BID  . insulin aspart  0-15 Units Subcutaneous TID AC & HS  . ketorolac  30 mg Intravenous Q8H  . mirtazapine  7.5 mg Oral QHS  . morphine  15 mg Oral Q12H  :  Allergies  Allergen Reactions  . Levaquin [Levofloxacin] Other (See Comments)    Suicidal thoughts, extreme anxiety, panic attack  . Tramadol Other (See Comments)    Fuzzy thinking and agitation per patient  . Amiodarone     Severe nausea  . Metoprolol     Drops blood pressure, falls and passes out  . Phenergan [Promethazine Hcl] Other (See Comments)    Restless  :  Family History  Problem Relation Age of Onset  . Breast cancer Mother 51  . Pulmonary embolism Mother   . Diabetes Father   . Stroke Father   . Heart disease Sister   . Healthy Sister   . Gastric cancer Maternal Uncle   . Prostate cancer Maternal Uncle   :  Social History   Socioeconomic History  . Marital status: Divorced    Spouse name: Not on file  . Number of children: Not on  file  . Years of education: Not on file  . Highest education level: Not on  file  Occupational History  . Not on file  Tobacco Use  . Smoking status: Former Smoker    Years: 4.00    Quit date: 12/31/1980    Years since quitting: 39.2  . Smokeless tobacco: Never Used  Vaping Use  . Vaping Use: Never used  Substance and Sexual Activity  . Alcohol use: No  . Drug use: No  . Sexual activity: Not Currently  Other Topics Concern  . Not on file  Social History Narrative   Divorced.  Lives with daughter and grandson. 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   Social Determinants of Health   Financial Resource Strain:   . Difficulty of Paying Living Expenses:   Food Insecurity:   . Worried About Charity fundraiser in the Last Year:   . Arboriculturist in the Last Year:   Transportation Needs:   . Film/video editor (Medical):   [name] Lack of Transportation (Non-Medical):   Physical Activity:   . Days of Exercise per Week:   . Minutes of Exercise per Session:   Stress:   . Feeling of Stress :   Social Connections:   . Frequency of Communication with Friends and Family:   . Frequency of Social Gatherings with Friends and Family:   . Attends Religious Services:   . Active Member of Clubs or Organizations:   . Attends Archivist Meetings:   [name] Marital Status:   Intimate Partner Violence:   . Fear of Current or Ex-Partner:   . Emotionally Abused:   [name] Physically Abused:   . Sexually Abused:   :  Review of Systems  Constitutional: Positive for fever.  HENT: Negative.   Eyes: Negative.   Respiratory: Negative.   Cardiovascular: Positive for palpitations and leg swelling.  Gastrointestinal: Positive for nausea and vomiting.  Genitourinary: Negative.   Musculoskeletal: Positive for back pain.  Skin: Negative.   Neurological: Positive for weakness.  Endo/Heme/Allergies: Negative.   Psychiatric/Behavioral: The patient is nervous/anxious.      Exam:  This is  a fairly well-developed and well-nourished white female.  She is having some pain issues.  She is alert and oriented.  Vital signs are temperature 97.6.  Pulse 120.  Blood pressure 107/71.  Weight is 163 pounds.  Head and neck exam shows no scleral icterus.  There is no ocular or oral lesions.  There is no adenopathy in the neck.  Lungs are relatively clear bilaterally.  Cardiac exam tachycardic and irregular.  She has a 1/6 systolic ejection murmur.  Abdomen is soft.  There is no fluid wave.  There is no palpable liver or spleen tip.  Extremities shows wrappings on her lower legs.  She does have 2-3+ edema in her lower legs.  Neurological exam is nonfocal.   Patient Vitals for the past 24 hrs:  BP Temp Temp src Pulse Resp SpO2 Weight  03/20/20 0515 107/71 97.6 F (36.4 C) Oral (!) 107 20 95 % 163 lb 2.3 oz (74 kg)  03/20/20 0006 114/61 97.6 F (36.4 C) Oral (!) 117 14 97 % --  03/19/20 1953 102/79 97.8 F (36.6 C) Oral (!) 116 14 98 % --  03/19/20 1841 114/71 (!) 97.5 F (36.4 C) Oral (!) 121 18 97 % --  03/19/20 1221 107/70 97.6 F (36.4 C) Oral (!) 124 18 93 % --  03/19/20 0754 122/70 97.7 F (36.5 C) Oral (!) 117 18 96 % --  Recent Labs    03/19/20 0151 03/20/20 0248  WBC 21.1* 18.1*  HGB 11.2* 11.1*  HCT 36.8 36.5  PLT 257 229   Recent Labs    03/19/20 0151 03/20/20 0248  NA 132* 133*  K 5.4* 4.7  CL 100 101  CO2 16* 23  GLUCOSE 67* 138*  BUN 53* 52*  CREATININE 1.90* 1.69*  CALCIUM 6.2* 6.5*    Blood smear review: None  Pathology: None    Assessment and Plan: KaylaAldava is a very nice 74 year old postmenopausal white female.  She has metastatic breast cancer.  She has Lynch syndrome.  We have are not immunotherapy.  She also is on antiestrogen therapy with Faslodex.  She is due for next cycle I think in a week.  Pain control is critical.  She we can was get palliative care and for her.  She does not need hospice.  Immunotherapy will work.  I would think  that the CA 27.29 we will continue to improve.  The problem with immunotherapy is that it can take a while before it works.  I will give her a dose of Zometa while she is in the hospital.  We will try to be little more aggressive with her pain medication.  Again, comfort is paramount.  I appreciate the outstanding care that she is getting from all the staff up on [address].  I know that they are so compassionate.  I appreciate their help.  Kayla Haw, MD  Psalms [phone]

## 2020-03-20 NOTE — Progress Notes (Addendum)
Advanced Heart Failure Rounding Note  PCP-Cardiologist: No primary care provider on file.     Patient Profile   74 y/o woman with recently diagnosed metastatic breast CA (undergoing XRT), CAD s/p CABG, DM2, systolic HF with EF 35-32% and PAF.   Admitted with sepsis and lactic acidosis due to severe LE cellulitis in setting of marked volume overload. Course complicated by AF with RVR.   Subjective:    Remains in afib, V-rates 120s. BP stable.   On Vanc + meropenum. AF. WBC trending down, 23>>21>>18K. BC NGTD.   SCr trending down, 2.08>>1.90>>1.69. BUN 56>>53>>52.   LE Venous Dopplers to r/o DVT pending.    Objective:   Weight Range: 74 kg Body mass index is 28 kg/m.   Vital Signs:   Temp:  [97.5 F (36.4 C)-97.8 F (36.6 C)] 97.6 F (36.4 C) (08/17 0742) Pulse Rate:  [99-124] 99 (08/17 0742) Resp:  [14-20] 19 (08/17 0742) BP: (102-115)/(61-79) 115/73 (08/17 0742) SpO2:  [93 %-98 %] 97 % (08/17 0742) Weight:  [74 kg] 74 kg (08/17 0515) Last BM Date: 03/16/20  Weight change: Filed Weights   03/18/20 0423 03/18/20 0523 03/20/20 0515  Weight: 77.1 kg 74 kg 74 kg    Intake/Output:   Intake/Output Summary (Last 24 hours) at 03/20/2020 1015 Last data filed at 03/20/2020 0522 Gross per 24 hour  Intake 470 ml  Output 1300 ml  Net -830 ml      Physical Exam    General:  fatigue appearing. No resp difficulty HEENT: Normal Neck: Supple. JVP elevated to jawl-ine . Carotids 2+ bilat; no bruits. No lymphadenopathy or thyromegaly appreciated. Cor: PMI nondisplaced. Irregularly iregular rhythm and tachy rate No rubs, gallops or murmurs. Lungs: faint bilateral expiratory wheezing at the bases  Abdomen: Soft, nontender, nondistended. No hepatosplenomegaly. No bruits or masses. Good bowel sounds. Extremities: No cyanosis, clubbing, rash, 2+ bilateral LE edema up to thighs, both legs wrapped Neuro: Alert & orientedx3, cranial nerves grossly intact. moves all 4  extremities w/o difficulty. Affect pleasant   Telemetry   Atrial fibrillation, 120   EKG    No new EKG to review   Labs    CBC Recent Labs    03/19/20 0151 03/20/20 0248  WBC 21.1* 18.1*  NEUTROABS 19.4* 16.2*  HGB 11.2* 11.1*  HCT 36.8 36.5  MCV 86.8 86.9  PLT 257 992   Basic Metabolic Panel Recent Labs    03/18/20 0354 03/18/20 0354 03/19/20 0151 03/20/20 0248  NA 130*   < > 132* 133*  K 5.1   < > 5.4* 4.7  CL 98   < > 100 101  CO2 18*   < > 16* 23  GLUCOSE 139*   < > 67* 138*  BUN 56*   < > 53* 52*  CREATININE 2.08*   < > 1.90* 1.69*  CALCIUM 6.1*   < > 6.2* 6.5*  MG 1.9  --  2.5*  --   PHOS  --   --  4.3  --    < > = values in this interval not displayed.   Liver Function Tests Recent Labs    04/02/2020 2206 03/18/20 0354  AST 38 24  ALT 22 24  ALKPHOS 152* 154*  BILITOT 2.7* 2.0*  PROT 6.9 6.5  ALBUMIN 3.0* 2.9*   No results for input(s): LIPASE, AMYLASE in the last 72 hours. Cardiac Enzymes No results for input(s): CKTOTAL, CKMB, CKMBINDEX, TROPONINI in the last 72 hours.  BNP:  BNP (last 3 results) Recent Labs    12/04/19 1324 03/18/20 0229  BNP 581.1* 912.0*    ProBNP (last 3 results) No results for input(s): PROBNP in the last 8760 hours.   D-Dimer No results for input(s): DDIMER in the last 72 hours. Hemoglobin A1C Recent Labs    03/18/20 0230  HGBA1C 7.2*   Fasting Lipid Panel No results for input(s): CHOL, HDL, LDLCALC, TRIG, CHOLHDL, LDLDIRECT in the last 72 hours. Thyroid Function Tests No results for input(s): TSH, T4TOTAL, T3FREE, THYROIDAB in the last 72 hours.  Invalid input(s): FREET3  Other results:   Imaging     No results found.   Medications:     Scheduled Medications: . amiodarone  200 mg Oral Daily  . apixaban  5 mg Oral BID  . insulin aspart  0-15 Units Subcutaneous TID AC & HS  . ketorolac  30 mg Intravenous Q8H  . mirtazapine  7.5 mg Oral QHS  . morphine  15 mg Oral Q12H      Infusions: . sodium chloride 500 mL (03/19/20 0418)  . meropenem (MERREM) IV 1 g (03/20/20 1000)  . ondansetron (ZOFRAN) IV    . vancomycin 1,250 mg (03/20/20 0522)  . zoledronic acid (ZOMETA) IV       PRN Medications:  sodium chloride, acetaminophen **OR** acetaminophen, HYDROmorphone (DILAUDID) injection, morphine, ondansetron **OR** ondansetron (ZOFRAN) IV, polyethylene glycol   Assessment/Plan    1. Atrial Fibrillation with RVR - S/p MAZE and LAA clipping at time of CABG in 11/2016. S/p DCCV in 09/2015 and more recently 12/09/2019. Noted to be back in atrial fibrillation at last office visit on 01/30/2020 and started on low dose PO Amiodarone. - Presented in atrial fibrillation with RVR in the setting of sepsis secondary to cellulitis of bilateral lower extremities. Difficult situation complicated by patient's intolerance to multiple cardiac meds. V-tates currently in the 120s. - Not been able to tolerate IV Amiodarone due to severe nausea. - Not able to tolerate beta-blocker due to hypotension with syncope. - Calcium channel blockers not a good option with reduced EF. - Digoxin not a good option with current AKI.  - continue to treat w/ low dose PO amio, 200 mg daily + treatment of acute infection  - Continue Eliquis 5mg  twice daily.    2. Acute on Chronic Combined CHF - History of ischemic cardiomyopathy. PYP in the past negative for amyloid. - Most recent Echo in 12/2019 showed LVEF of 35-40% with severe asymmetric LVH, mild MR, moderate TR, and severely elevated PASP. - Chest x-ray showed cardiomegaly with mildly enlarged central pulmonary vessel but no overt edema. BNP 912 - Home Lasix held on admit and patient actually given some IV fluids on admission due to sepsis/ AKI.  - she is significantly volume overloaded w/ 2+ bilateral LEE, start IV Lasix 80 mg bid.  - follow BMP  - intolerant to Losartan due to hyperkalemia    3. CAD  - S/p CABG x4 in 11/2016.  -  High-sensitivity troponin minimally elevated and flat at 22 >> 24. Not consistent with ACS. Suspect demand ischemia in setting of sepsis and atrial fibrillation with RVR. - She reports constant left sided flank/breast pain that is worse with inspiration for the last several months. Sounds like this may be secondary to radiation. Nothing that sounds like angina. - No Aspirin with need for Eliquis.  - Continue statin.  4. 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 Had VF arrest in 11/2016  with appropriate ICD shock.  - No recurrent VT/VF.  - Had not been able to tolerate Amiodarone or beta-blocker in the past.  5. Sepsis Secondary to Bilateral Lower Extremity Cellulitis - on broad spectrum abx w/ vanc + meropenum. AF. Lactic acid normalized. WBC trending down. Blood cultures NGTD   6. 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 - Diagnosed in 12/2019. Lynch syndrome diagnosed by genetic study.  - Follows with Dr. Marin Olp as outpatient. On immunotherapy   7. Hyperkalemia - resolved today, K down from 5.4>>4.7    8. Type 2 Diabetes Mellitus - Management per primary team.  9. Acute on Chronic Kidney Disease - baseline SCr 1.0-1.30 range  - Creatinine 2.20 on admission, sepsis and deceased PO intake with constant nausea and frequent vomiting - improving w/ diuretic hold and IVFs, down to 1.69 today  - starting IV Lasix for CHF. Monitor BMP    Length of Stay: 2  Lyda Jester, PA-C  03/20/2020, 10:15 AM  Advanced Heart Failure Team Pager [phone] (M-F; 7a - 4p)  Please contact [facility] for night-coverage after hours (4p -7a ) and weekends on [email or web address]  Patient seen and examined with the above-signed Advanced Practice Provider and/or Housestaff. I personally reviewed laboratory data, imaging studies and relevant notes. I independently examined the patient and formulated the important aspects of the plan. I have edited the note to reflect any of my changes  or salient points. I have personally discussed the plan with the patient and/or family.  Remains in AF with RVR. Refuses IV amio. Tolerating low-dose po. Remains volume overloaded with 3+ edema. No DVT on u/s.   General:  Sitting up in bed No resp difficulty HEENT: normal Neck: supple. JVP to jaw . Carotids 2+ bilat; no bruits. No lymphadenopathy or thryomegaly appreciated. Cor: PMI nondisplaced. Irregular. tachy No rubs, gallops or murmurs. Lungs: clear Abdomen: soft, nontender, nondistended. No hepatosplenomegaly. No bruits or masses. Good bowel sounds. Extremities: no cyanosis, clubbing, rash, 3+ edema wrapped  Neuro: alert & orientedx3, cranial nerves grossly intact. moves all 4 extremities w/o difficulty. Affect pleasant  Remains tenuous. Continue abx for cellulitis. Start IV lasix. Continue po amio. Hopefully rate control will improve as infection is better under control and we load amio.   Glori Bickers, MD  7:36 PM

## 2020-03-20 NOTE — Progress Notes (Signed)
Bilateral lower extremity venous duplex complete.  Please see CV proc tab for preliminary results. Lita Mains- RDMS, RVT 3:21 PM  03/20/2020

## 2020-03-20 NOTE — Progress Notes (Signed)
Called Dr. Pietro Cassis to make aware of bloody urine from pt manipulation of prolapsed bladder. Pt states this is not usual.  Normally couple drops of blood.  Blood noted on peripad and in bedside commode. Visual inspection of bladder shows ulcerations to outside. Pt and MD aware. Will continue to monitor.  Dr. Haroldine Laws aware of difficulty in getting strict I/O's.  Will adhere to daily weights. PM LPN made aware. Pt resting with call bell within reach.  Will continue to monitor.

## 2020-03-20 NOTE — Progress Notes (Signed)
Palliative-   Planned followup today with patient and her Grandson. Attempted twice to see patient. Grandson did not show. Both times patient was otherwise engaged with procedures.   Will attempt followup tomorrow.   Mariana Kaufman, AGNP-C Palliative Medicine  No charge

## 2020-03-20 NOTE — Progress Notes (Signed)
Unable to do in and out cath due to bladder is prolapse.Patient is not uncomfort and she will try again later. We did not attempt to do in and out cath

## 2020-03-20 NOTE — Progress Notes (Signed)
Mobility Specialist - Progress Note   03/20/20 1502  Mobility  Activity  (Cancel)  Mobility performed by Mobility specialist   RN advised not to see pt.  Pricilla Handler Mobility Specialist Mobility Specialist Phone: [phone]

## 2020-03-20 NOTE — Evaluation (Signed)
Physical Therapy Evaluation Patient Details Name: [name] MRN: [id number] DOB: 09-09-1945 Today's Date: 03/20/2020   History of Present Illness  [name] is a 74 y.o. 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 fib, ventricular tachycardia status post ICD in situ, breast cancer with bone mets, Lynch syndrome, Patient presented to the ED on 03/25/2020 with complaint of shortness of breath, worsening redness on both upper legs with weeping lesions  Clinical Impression  Patient is bed trying to eat lying down. Assisted patient to sit on bed edge with min assist. Patient stood x 1 very briefly with increased back and LE pain. Patient has caregivers available at home. Provided patient with info for uinder mattress rail to assist with sitting up in bed. Vs a hospital bed. Patient requesting a BSC, using current one over toilet.  patient's HR 105-129. BP 112/54. No dizziness.   Pt admitted with above diagnosis.  Pt currently with functional limitations due to the deficits listed below (see PT Problem List). Pt will benefit from skilled PT to increase their independence and safety with mobility to allow discharge to the venue listed below.       Follow Up Recommendations No PT follow up (pt. declines)    Equipment Recommendations  3in1 (PT) (requesting another one)    Recommendations for Other Services       Precautions / Restrictions Precautions Precautions: Fall Precaution Comments: HR high, weeping legs      Mobility  Bed Mobility Overal bed mobility: Needs Assistance Bed Mobility: Rolling;Sidelying to Sit Rolling: Supervision Sidelying to sit: Min assist       General bed mobility comments: extra effort to sit upright, min assist with trunk.  Transfers Overall transfer level: Needs assistance Equipment used: Rolling walker (2 wheeled) Transfers: Sit to/from Stand Sit to Stand: Min assist          General transfer comment: briefly stood for 20 seconds  Ambulation/Gait                Stairs            Wheelchair Mobility    Modified Rankin (Stroke Patients Only)       Balance Overall balance assessment: Needs assistance Sitting-balance support: No upper extremity supported;Feet supported Sitting balance-Leahy Scale: Good     Standing balance support: During functional activity;Bilateral upper extremity supported Standing balance-Leahy Scale: Poor Standing balance comment: reliant on UE's and RW                             Pertinent Vitals/Pain Pain Assessment: 0-10 Pain Score: 6  Pain Location: back Pain Descriptors / Indicators: Aching;Discomfort Pain Intervention(s): Monitored during session;Limited activity within patient's tolerance;Repositioned    Home Living Family/patient expects to be discharged to:: Private residence Living Arrangements: Children Available Help at Discharge: Family;Available 24 hours/day Type of Home: House Home Access: Stairs to enter   [employer] of Steps: 1 Home Layout: Two level;Able to live on main level with bedroom/bathroom Home Equipment: Wheelchair - manual      Prior Function Level of Independence: Needs assistance   Gait / Transfers Assistance Needed: uses RW, has a WC to get up 1 step           Hand Dominance        Extremity/Trunk Assessment   Upper Extremity Assessment Upper Extremity Assessment: Generalized weakness  Lower Extremity Assessment Lower Extremity Assessment: Generalized weakness;RLE deficits/detail;LLE deficits/detail RLE Deficits / Details: dressings on lower leg, edema,  foot is darkened when dependent. able to bear weight briefly LLE Deficits / Details: dressing on leg. Edema noted, less than right    Cervical / Trunk Assessment Cervical / Trunk Assessment: Normal  Communication      Cognition Arousal/Alertness: Awake/alert Behavior During  Therapy: WFL for tasks assessed/performed Overall Cognitive Status: Within Functional Limits for tasks assessed                                 General Comments: off  by 1 day for date      General Comments      Exercises     Assessment/Plan    PT Assessment Patient needs continued PT services  PT Problem List Decreased strength;Decreased mobility;Decreased safety awareness;Decreased knowledge of precautions;Decreased activity tolerance;Decreased cognition;Decreased skin integrity;Cardiopulmonary status limiting activity;Pain       PT Treatment Interventions DME instruction;Therapeutic activities;Therapeutic exercise;Patient/family education;Gait training;Functional mobility training    PT Goals (Current goals can be found in the Care Plan section)  Acute Rehab PT Goals Patient Stated Goal: to go home PT Goal Formulation: With patient Time For Goal Achievement: 04/03/20 Potential to Achieve Goals: Fair    Frequency Min 3X/week   Barriers to discharge        Co-evaluation               AM-PAC PT "6 Clicks" Mobility  Outcome Measure Help needed turning from your back to your side while in a flat bed without using bedrails?: A Little Help needed moving from lying on your back to sitting on the side of a flat bed without using bedrails?: A Little Help needed moving to and from a bed to a chair (including a wheelchair)?: A Lot Help needed standing up from a chair using your arms (e.g., wheelchair or bedside chair)?: A Lot Help needed to walk in hospital room?: Total Help needed climbing 3-5 steps with a railing? : Total 6 Click Score: 12    End of Session   Activity Tolerance: Patient limited by fatigue Patient left: in bed;with call bell/phone within reach;with bed alarm set;with nursing/sitter in room (seated on bed edge) Nurse Communication: Mobility status PT Visit Diagnosis: Unsteadiness on feet (R26.81);Difficulty in walking, not elsewhere  classified (R26.2);Pain Pain - Right/Left: Right Pain - part of body: Leg    Time: 5397-6734 PT Time Calculation (min) (ACUTE ONLY): 54 min   Charges:   PT Evaluation $PT Eval Moderate Complexity: 1 Mod PT Treatments $Therapeutic Activity: 8-22 mins $Self Care/Home Management: [place] Pager [phone] Office [phone]  Claretha Cooper 03/20/2020, 10:10 AM

## 2020-03-20 NOTE — Progress Notes (Addendum)
PROGRESS NOTE  [name]  DOB: [birth year]  PCP: Patient, No Pcp Per [facility]:[id number]  DOA: 03/31/2020  LOS: 2 days   Chief Complaint  Patient presents with  . Chest Pain    Brief narrative: [name] is a 74 y.o. 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 fib, ventricular tachycardia status post ICD in situ, breast cancer with bone mets, Lynch syndrome,  Patient presented to the ED on 03/10/2020 with complaint of shortness of breath, worsening redness on both upper legs with weeping lesions. 2 weeks prior to presentation.  Patient had an exfoliative scrub of her bilateral lower extremities.  In subsequent few days, patient started having bilateral lower extremity redness, pain and weeping lesions extending up to the thigh associated with pain worsening on ambulation.  She also reported chest pain and shortness of breath which he apparently has since her diagnosis of breast cancer in May.  In the ED, patient was afebrile, she was in A. fib with RVR to 120s, blood pressure stable, breathing on room air. Labs showed WBC count elevated to 24.5, lactic acid 2.2, sodium low at 129, potassium elevated to 5.4, BUN/creatinine 55/2.03, serum bicarb low at 15,  Chest x-ray showed cardiomegaly without overt failure. New mild to moderate compression deformity of mid to lower thoracic vertebra.  Patient was admitted to hospital service for sepsis secondary to bilateral lower extremity cellulitis.  Subjective: Patient was seen and examined this morning. Sitting up at the edge of the bed.  Not in distress.  Nausea is better after she was switched from OxyContin to MS Contin. Improving bilateral leg cellulitis and swelling  Heart rate between 100 and 120.  Blood pressure in 100s. Labs from this morning with sodium 133, K 4.7, Cr 1.69 WBC 18.1  Assessment/Plan: Sepsis -POA Immunocompromised status Bilateral lower  extremity cellulitis -Presented with worsening redness, pain, weeping lesions of both lower extremities. -Met sepsis criteria with tachycardia, tachypnea, leukocytosis, lactic acidosis with a source. -Blood culture sent. -Patient has been receiving IV vancomycin and IV meropenem.  -Clinically cellulitis is improving, WBC count improving as well.  No fever last 24 hours.  -We will de-escalate antibiotics to IV Rocephin today for cellulitis order set. Recent Labs  Lab 04/02/2020 1905 03/18/20 0354 03/19/20 0151 03/20/20 0248  WBC 24.5* 23.0* 21.1* 18.1*   Recent Labs  Lab 03/18/20 0135 03/18/20 0232 03/18/20 0354 03/19/20 0151  LATICACIDVEN 2.2* 2.2*  --  1.8  PROCALCITON  --   --  0.59  --    Acute kidney injury on CKD 3a -Baseline creatinine seems to be between 1-1.3 although with wide fluctuations noted in chart review. -Presented with creatinine elevated to 2.03. Gradually improving.  Trend as below.  -Off IV fluid.  Apparently was given IV Lasix today by cardiology. -Continue to monitor creatinine. Recent Labs    12/19/19 1550 12/26/19 1113 01/10/20 1153 01/26/20 1330 02/21/20 1200 03/20/2020 1905 03/26/2020 2225 03/18/20 0354 03/19/20 0151 03/20/20 0248  CREATININE 1.25* 1.79* 2.12* 1.33* 1.01* 2.03* 2.20* 2.08* 1.90* 1.69*   Chronic combined CHF/ HTN -EF 35-40% 12/2019, follows with Dr. Haroldine Laws as an outpatient. -Home meds include Lasix 80 mg daily and losartan 25 mg daily. -Initially meds were held and patient was given IV hydration because of sepsis. -Cardiology consult appreciated. -Patient was initiated on IV Lasix this morning.  A. fib with RVR H/o paroxysmal A. Fib s/p left atrial appendage clip History  of V. Tach, ICD in situ.  -Presented with RVR, likely precipitated by sepsis. -Currently on amiodarone 200 mg daily and Eliquis 5 mg twice daily.  CAD s/p CABG; HLD -On Eliquis.  I do not see statin in the home list  Hyponatremia -Remains in the range  of 130 to 135. Recent Labs  Lab 04/01/2020 1905 03/15/2020 2225 03/18/20 0241 03/18/20 0354 03/19/20 0151 03/20/20 0248  NA 129* 130* 131* 130* 132* 133*   Hypocalcemia -Calcium level continues to remain low at 6.5 today. -IV calcium gluconate ordered again. -Oncology plans to start Zometa.  Chronic hyperkalemia -Patient states that her potassium level has remained elevated since November 2020.  She is not on any regular use of potassium lowering agents.  Potassium level was elevated to 5.4 yesterday.  Down to 4.7 today.    Diabetes mellitus type 2 -A1c 7.2 on 8/15. Hypoglycemia -On glipizide 5 mg daily at home. -Currently on sliding scale insulin with Accu-Cheks.   -Blood sugar level was low at 67 this morning.  Oral appetite encouraged.  Continue to monitor.  Breast cancer with bone mets, Lynch syndrome Chronic nausea -Breast cancer diagnosed in May.  Lynch syndrome diagnosed by genetic study.   -Follows up with Dr. Marin Olp as an outpatient.  Currently not on Ibrance.  Takes infusion periodically. -Chest x-ray on admission showed mild to moderate compression deformity of mid to lower thoracic vertebra. -Continue pain control with MS Contin.  Noted that she has been started on IV Dilaudid every 2 hours as needed by Dr. Marin Olp this morning. -Nausea improving after OxyContin was changed to MS Contin. -Continue as needed Zofran.  Bladder prolapse -f/u with urology as an outpatient.  Goals of care -Palliative consulted.   Mobility: Uses walker at home.  PT eval ordered. Code Status:   Code Status: DNR  Nutritional status: Body mass index is 28 kg/m.     Diet Order            Diet heart healthy/carb modified Room service appropriate? Yes; Fluid consistency: Thin  Diet effective now                 DVT prophylaxis:  apixaban (ELIQUIS) tablet 5 mg   Antimicrobials:  IV Rocephin Fluid: None  Consultants: None Family Communication:  None at bedside  Status is:  Inpatient  Remains inpatient appropriate because:Hemodynamically unstable, Persistent severe electrolyte disturbances, Ongoing diagnostic testing needed not appropriate for outpatient work up and IV treatments appropriate due to intensity of illness or inability to take PO   Dispo: The patient is from: Home              Anticipated d/c is to: Home              Anticipated d/c date is: > 3 days              Patient currently is not medically stable to d/c.  Infusions:  . sodium chloride 500 mL (03/19/20 0418)  . calcium gluconate 2,000 mg (03/20/20 1325)  . cefTRIAXone (ROCEPHIN)  IV    . ondansetron (ZOFRAN) IV      Scheduled Meds: . amiodarone  200 mg Oral Daily  . apixaban  5 mg Oral BID  . insulin aspart  0-15 Units Subcutaneous TID AC & HS  . mirtazapine  7.5 mg Oral QHS  . morphine  15 mg Oral Q12H    Antimicrobials: Anti-infectives (From admission, onward)   Start     Dose/Rate Route  Frequency Ordered Stop   03/20/20 1430  cefTRIAXone (ROCEPHIN) 2 g in sodium chloride 0.9 % 100 mL IVPB     Discontinue     2 g 200 mL/hr over 30 Minutes Intravenous Every 24 hours 03/20/20 1345     03/20/20 0600  vancomycin (VANCOREADY) IVPB 1250 mg/250 mL  Status:  Discontinued        1,250 mg 166.7 mL/hr over 90 Minutes Intravenous Every 48 hours 03/18/20 0230 03/20/20 1345   03/18/20 0330  vancomycin (VANCOREADY) IVPB 1500 mg/300 mL        1,500 mg 150 mL/hr over 120 Minutes Intravenous  Once 03/18/20 0230 03/18/20 0803   03/18/20 0330  meropenem (MERREM) 1 g in sodium chloride 0.9 % 100 mL IVPB  Status:  Discontinued        1 g 200 mL/hr over 30 Minutes Intravenous 2 times daily 03/18/20 0230 03/20/20 1345   03/18/20 0130  piperacillin-tazobactam (ZOSYN) IVPB 3.375 g  Status:  Discontinued        3.375 g 100 mL/hr over 30 Minutes Intravenous  Once 03/18/20 0115 03/18/20 0214      PRN meds: sodium chloride, acetaminophen **OR** acetaminophen, HYDROmorphone (DILAUDID) injection,  morphine, ondansetron **OR** ondansetron (ZOFRAN) IV, polyethylene glycol   Objective: Vitals:   03/20/20 0742 03/20/20 1115  BP: 115/73 108/75  Pulse: 99 62  Resp: 19 16  Temp: 97.6 F (36.4 C) 98.1 F (36.7 C)  SpO2: 97% 98%    Intake/Output Summary (Last 24 hours) at 03/20/2020 1400 Last data filed at 03/20/2020 0522 Gross per 24 hour  Intake 470 ml  Output 1300 ml  Net -830 ml   Filed Weights   03/18/20 0423 03/18/20 0523 03/20/20 0515  Weight: 77.1 kg 74 kg 74 kg   Weight change:  Body mass index is 28 kg/m.   Physical Exam: General exam: Appears calm and comfortable. Mild to moderate distress because of persistent nausea Skin: No rashes, lesions or ulcers. HEENT: Atraumatic, normocephalic, supple neck, no obvious bleeding Lungs: Clear to auscultation bilaterally CVS: A. fib with RVR, no murmur GI/Abd soft, nontender, nondistended, bowel sound present CNS: Alert, awake, oriented x3 Psychiatry: Mood appropriate today Extremities: Bilateral lower extremity redness improving.  Swelling persists.  Has compression bandage on.  Data Review: I have personally reviewed the laboratory data and studies available.  Recent Labs  Lab 03/27/2020 1905 03/16/2020 1905 03/15/2020 2225 03/18/20 0241 03/18/20 0354 03/19/20 0151 03/20/20 0248  WBC 24.5*  --   --   --  23.0* 21.1* 18.1*  NEUTROABS  --   --   --   --  21.4* 19.4* 16.2*  HGB 11.9*   < > 15.0 15.0 12.0 11.2* 11.1*  HCT 40.5   < > 44.0 44.0 39.3 36.8 36.5  MCV 89.6  --   --   --  88.5 86.8 86.9  PLT 279  --   --   --  290 257 229   < > = values in this interval not displayed.   Recent Labs  Lab 03/04/2020 1905 04/01/2020 1905 03/24/2020 2225 03/18/20 0241 03/18/20 0354 03/19/20 0151 03/20/20 0248  NA 129*   < > 130* 131* 130* 132* 133*  K 5.4*   < > 5.0 5.1 5.1 5.4* 4.7  CL 99  --  98  --  98 100 101  CO2 15*  --   --   --  18* 16* 23  GLUCOSE 184*  --  138*  --  139* 67* 138*  BUN 55*  --  55*  --  56* 53* 52*   CREATININE 2.03*  --  2.20*  --  2.08* 1.90* 1.69*  CALCIUM 6.2*  --   --   --  6.1* 6.2* 6.5*  MG  --   --   --   --  1.9 2.5*  --   PHOS  --   --   --   --   --  4.3  --    < > = values in this interval not displayed.   Lab Results  Component Value Date   HGBA1C 7.2 (H) 03/18/2020       Component Value Date/Time   CHOL 104 12/05/2019 0442   CHOL 183 07/05/2019 1137   TRIG 90 12/05/2019 0442   HDL 25 (L) 12/05/2019 0442   HDL 45 07/05/2019 1137   CHOLHDL 4.2 12/05/2019 0442   VLDL 18 12/05/2019 0442   LDLCALC 61 12/05/2019 0442   LDLCALC 110 (H) 07/05/2019 1137   LABVLDL 28 07/05/2019 1137   Signed, Terrilee Croak, MD [facility] Pager: [phone] (Secure Chat preferred). 03/20/2020

## 2020-03-21 DIAGNOSIS — I5043 Acute on chronic combined systolic (congestive) and diastolic (congestive) heart failure: Secondary | ICD-10-CM

## 2020-03-21 LAB — CBC WITH DIFFERENTIAL/PLATELET
Abs Immature Granulocytes: 0.19 10*3/uL — ABNORMAL HIGH (ref 0.00–0.07)
Basophils Absolute: 0.1 10*3/uL (ref 0.0–0.1)
Basophils Relative: 1 %
Eosinophils Absolute: 0.7 10*3/uL — ABNORMAL HIGH (ref 0.0–0.5)
Eosinophils Relative: 6 %
HCT: 37.3 % (ref 36.0–46.0)
Hemoglobin: 11.3 g/dL — ABNORMAL LOW (ref 12.0–15.0)
Immature Granulocytes: 2 %
Lymphocytes Relative: 5 %
Lymphs Abs: 0.6 10*3/uL — ABNORMAL LOW (ref 0.7–4.0)
MCH: 26.8 pg (ref 26.0–34.0)
MCHC: 30.3 g/dL (ref 30.0–36.0)
MCV: 88.6 fL (ref 80.0–100.0)
Monocytes Absolute: 0.7 10*3/uL (ref 0.1–1.0)
Monocytes Relative: 5 %
Neutro Abs: 10.1 10*3/uL — ABNORMAL HIGH (ref 1.7–7.7)
Neutrophils Relative %: 81 %
Platelets: 235 10*3/uL (ref 150–400)
RBC: 4.21 MIL/uL (ref 3.87–5.11)
RDW: 23 % — ABNORMAL HIGH (ref 11.5–15.5)
WBC: 12.3 10*3/uL — ABNORMAL HIGH (ref 4.0–10.5)
nRBC: 0.3 % — ABNORMAL HIGH (ref 0.0–0.2)

## 2020-03-21 LAB — BASIC METABOLIC PANEL
Anion gap: 10 (ref 5–15)
BUN: 52 mg/dL — ABNORMAL HIGH (ref 8–23)
CO2: 21 mmol/L — ABNORMAL LOW (ref 22–32)
Calcium: 6.5 mg/dL — ABNORMAL LOW (ref 8.9–10.3)
Chloride: 101 mmol/L (ref 98–111)
Creatinine, Ser: 1.67 mg/dL — ABNORMAL HIGH (ref 0.44–1.00)
GFR calc Af Amer: 35 mL/min — ABNORMAL LOW (ref >60)
GFR calc non Af Amer: 30 mL/min — ABNORMAL LOW (ref >60)
Glucose, Bld: 118 mg/dL — ABNORMAL HIGH (ref 70–99)
Potassium: 4.4 mmol/L (ref 3.5–5.1)
Sodium: 132 mmol/L — ABNORMAL LOW (ref 135–145)

## 2020-03-21 LAB — GLUCOSE, CAPILLARY
Glucose-Capillary: 90 mg/dL (ref 70–99)
Glucose-Capillary: 131 mg/dL — ABNORMAL HIGH (ref 70–99)
Glucose-Capillary: 88 mg/dL (ref 70–99)
Glucose-Capillary: 171 mg/dL — ABNORMAL HIGH (ref 70–99)

## 2020-03-21 LAB — CANCER ANTIGEN 27.29: CA 27.29: 1114.2 U/mL — ABNORMAL HIGH (ref 0.0–38.6)

## 2020-03-21 MED ORDER — SENNA 8.6 MG PO TABS
2.0000 | ORAL_TABLET | Freq: Two times a day (BID) | ORAL | Status: DC
  Administered 2020-03-23 – 2020-04-06 (×10): 17.2 mg via ORAL
  Filled 2020-03-21 (×23): qty 2

## 2020-03-21 MED ORDER — FUROSEMIDE 10 MG/ML IJ SOLN
80.0000 mg | Freq: Once | INTRAMUSCULAR | Status: AC
  Administered 2020-03-21: 80 mg via INTRAVENOUS
  Filled 2020-03-21: qty 8

## 2020-03-21 NOTE — Progress Notes (Signed)
[facility]  PROGRESS NOTE  [name] DQQ:[id number] DOB: Nov 15, 1945 DOA: 03/20/2020 PCP: [name], No Pcp Per Admit date - 03/09/2020   Admitting Physician Vernelle Emerald, MD  Outpatient Primary MD for the [name] is [name], No Pcp Per  LOS - 3 Brief Narrative   [name] is a 74 y.o. 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 fib status post ICD, breast cancer with bone mets/Lynch syndrome who presented on 8/14 with several days of shortness of breath, worsening redness of lower legs and weeping lesions for the past 2 weeks in setting of using Derm stone to scrape her dry skin and found to have A. fib with RVR to the 120s and sepsis physiology (white count of 24.5, lactic acid 2.2, Respiratory rate of 21) secondary to bilateral lower extremity cellulitis course complicated by A. fib with RVR, AKI on CKD with hyperkalemia    Subjective  Today feels breathing is stable.  Denies any chest pain.  Thinks the swelling in her legs is coming down somewhat  A & P  Sepsis secondary to nonpurulent cellulitis of bilateral lower extremities.  Unfortunate consequence of stone use to skin.  Remains afebrile, white count downtrending, blood cultures unremarkable -Continue IV ceftriaxone, anticipate transitioning to oral for coverage of MSSA (likely culprit)  A. fib with RVR, likely exacerbated in setting of sepsis due to above.  Heart rate remains in the 120s.  [name]'s been unable to tolerate IV amiodarone due to nausea in the past, CCB is not an option with decreased EF, hypotension to prior beta-blockade -Appreciate cardiology recommendations, continue low-dose p.o. amiodarone 20 mg daily -Hopeful continue treatment of infection will improve heart rates  Acute on chronic, systolic/diastolic CHF.  Still quite volume  overloaded on exam with 3+ pitting edema bilateral lower extremities.  In the setting of sepsis on admission and holding home diuretics.  BNP in the 900s on admission -Appreciate heart failure recommendations, continue IV Lasix, monitor output and volume status -Intolerant to losartan due to hyperkalemia  Slightly elevated high-sensitivity troponin CAD status post CABG (2018).  Suspect demand ischemia in setting of sepsis and A. fib with RVR Not having any anginal equivalents, does have chronic constant left-sided flank pain for several months related to radiation-continue home Eliquis and statin  History of VT/V. fib, status post ICD (2016), history of V. fib arrest in 11/2016 -Has not been able to tolerate amiodarone or beta blockade in the past -Currently on amiodarone 20 mg daily  Metastatic breast cancer with bone mets.  Diagnosed 12/2019. -Continue outpatient follow-up with Dr. Marin Olp, on immunotherapy.  Type 2 diabetes, A1c 7.2 -Holding home glipizide -Monitor CBG, sliding scale as needed  Acute on CKD with hyperkalemia , resolved. Peak creatinine of 2.2, now back to baseline of 1.6, and potassium elevation now resolved.  Likely prerenal in the setting of sepsis etiology improved with IV fluids -Avoid nephrotoxins -Monitor BMP     Family Communication  : None  Code Status : DNR, discussed on day of admission  Disposition Plan  :  [name] is from home. Anticipated d/c date: 2 to 3 days. Barriers to d/c or necessity for inpatient status:  Needs continued IV Lasix for volume status, currently on IV ceftriaxone will transition to oral in 24 hours if remains clinically improved Consults  : Advanced heart failure,  oncology  Procedures  : Venous duplex 8/18  DVT Prophylaxis  : Eliquis Lab Results  Component Value Date   PLT 235 03/21/2020    Diet :  Diet Order            Diet regular Room service appropriate? Yes; Fluid consistency: Thin  Diet effective now                   Inpatient Medications Scheduled Meds: . amiodarone  200 mg Oral Daily  . apixaban  5 mg Oral BID  . insulin aspart  0-15 Units Subcutaneous TID AC & HS  . mirtazapine  7.5 mg Oral QHS  . morphine  15 mg Oral Q12H  . senna  2 tablet Oral BID   Continuous Infusions: . sodium chloride 500 mL (03/19/20 0418)  . cefTRIAXone (ROCEPHIN)  IV 2 g (03/21/20 1421)  . ondansetron (ZOFRAN) IV     PRN Meds:.sodium chloride, acetaminophen **OR** acetaminophen, HYDROmorphone (DILAUDID) injection, morphine, ondansetron **OR** ondansetron (ZOFRAN) IV, polyethylene glycol  Antibiotics  :   Anti-infectives (From admission, onward)   Start     Dose/Rate Route Frequency Ordered Stop   03/20/20 1430  cefTRIAXone (ROCEPHIN) 2 g in sodium chloride 0.9 % 100 mL IVPB     Discontinue     2 g 200 mL/hr over 30 Minutes Intravenous Every 24 hours 03/20/20 1345     03/20/20 0600  vancomycin (VANCOREADY) IVPB 1250 mg/250 mL  Status:  Discontinued        1,250 mg 166.7 mL/hr over 90 Minutes Intravenous Every 48 hours 03/18/20 0230 03/20/20 1345   03/18/20 0330  vancomycin (VANCOREADY) IVPB 1500 mg/300 mL        1,500 mg 150 mL/hr over 120 Minutes Intravenous  Once 03/18/20 0230 03/18/20 0803   03/18/20 0330  meropenem (MERREM) 1 g in sodium chloride 0.9 % 100 mL IVPB  Status:  Discontinued        1 g 200 mL/hr over 30 Minutes Intravenous 2 times daily 03/18/20 0230 03/20/20 1345   03/18/20 0130  piperacillin-tazobactam (ZOSYN) IVPB 3.375 g  Status:  Discontinued        3.375 g 100 mL/hr over 30 Minutes Intravenous  Once 03/18/20 0115 03/18/20 0214       Objective   Vitals:   03/21/20 0732 03/21/20 1111 03/21/20 1436 03/21/20 1600  BP: 103/75 109/76 100/79 108/90  Pulse: (!) 109 (!) 117 (!) 107   Resp: 18 14 14 20   Temp: 97.6 F (36.4 C) 97.7 F (36.5 C) 97.6 F (36.4 C)   TempSrc: Oral Oral Oral   SpO2: 97% 96% 94%   Weight:      Height:        SpO2: 94 %  Wt Readings from Last 3  Encounters:  03/21/20 76.6 kg  01/30/20 82.9 kg  12/28/19 82.9 kg     Intake/Output Summary (Last 24 hours) at 03/21/2020 1759 Last data filed at 03/21/2020 0830 Gross per 24 hour  Intake 360 ml  Output 450 ml  Net -90 ml    Physical Exam:     Awake Alert, Oriented X 3, Normal affect No new F.N deficits,  [place].AT, Normal respiratory effort on room air, CTAB Irregularly irregular heart rhythm, normal rate, 3+ pitting edema bilateral lower extremities to knee +ve B.Sounds, Abd Soft, No tenderness, No rebound, guarding or rigidity. Right lateral leg with large area of erythema and weeping lesions Right lateral leg  Right anterior leg  Left lateral leg      I have personally reviewed the following:   Data Reviewed:  CBC Recent Labs  Lab 03/30/2020 1905 03/30/2020 2225 03/18/20 0241 03/18/20 0354 03/19/20 0151 03/20/20 0248 03/21/20 0449  WBC 24.5*  --   --  23.0* 21.1* 18.1* 12.3*  HGB 11.9*   < > 15.0 12.0 11.2* 11.1* 11.3*  HCT 40.5   < > 44.0 39.3 36.8 36.5 37.3  PLT 279  --   --  290 257 229 235  MCV 89.6  --   --  88.5 86.8 86.9 88.6  MCH 26.3  --   --  27.0 26.4 26.4 26.8  MCHC 29.4*  --   --  30.5 30.4 30.4 30.3  RDW 22.7*  --   --  22.7* 22.5* 22.9* 23.0*  LYMPHSABS  --   --   --  0.5* 0.5* 0.5* 0.6*  MONOABS  --   --   --  0.7 0.8 0.8 0.7  EOSABS  --   --   --  0.0 0.2 0.3 0.7*  BASOSABS  --   --   --  0.1 0.0 0.0 0.1   < > = values in this interval not displayed.    Chemistries  Recent Labs  Lab 03/22/2020 1905 03/16/2020 1905 03/16/2020 2206 03/19/2020 2225 03/26/2020 2225 03/18/20 0241 03/18/20 0354 03/19/20 0151 03/20/20 0248 03/21/20 0449  NA 129*   < >  --  130*   < > 131* 130* 132* 133* 132*  K 5.4*   < >  --  5.0   < > 5.1 5.1 5.4* 4.7 4.4  CL 99   < >  --  98  --   --  98 100 101 101  CO2 15*  --   --   --   --   --  18* 16* 23 21*  GLUCOSE 184*   < >  --  138*  --   --  139* 67* 138* 118*  BUN 55*   < >  --  55*  --   --  56* 53* 52*  52*  CREATININE 2.03*   < >  --  2.20*  --   --  2.08* 1.90* 1.69* 1.67*  CALCIUM 6.2*  --   --   --   --   --  6.1* 6.2* 6.5* 6.5*  MG  --   --   --   --   --   --  1.9 2.5*  --   --   AST  --   --  38  --   --   --  24  --   --   --   ALT  --   --  22  --   --   --  24  --   --   --   ALKPHOS  --   --  152*  --   --   --  154*  --   --   --   BILITOT  --   --  2.7*  --   --   --  2.0*  --   --   --    < > = values in this interval not displayed.   ------------------------------------------------------------------------------------------------------------------ No results for input(s): CHOL, HDL, LDLCALC, TRIG, CHOLHDL, LDLDIRECT in the last 72 hours.  Lab Results  Component Value Date   HGBA1C 7.2 (H) 03/18/2020   ------------------------------------------------------------------------------------------------------------------ No results for input(s): TSH, T4TOTAL,  T3FREE, THYROIDAB in the last 72 hours.  Invalid input(s): FREET3 ------------------------------------------------------------------------------------------------------------------ No results for input(s): VITAMINB12, FOLATE, FERRITIN, TIBC, IRON, RETICCTPCT in the last 72 hours.  Coagulation profile Recent Labs  Lab 03/18/20 0354  INR 2.5*    No results for input(s): DDIMER in the last 72 hours.  Cardiac Enzymes No results for input(s): CKMB, TROPONINI, MYOGLOBIN in the last 168 hours.  Invalid input(s): CK ------------------------------------------------------------------------------------------------------------------    Component Value Date/Time   BNP 912.0 (H) 03/18/2020 0229   BNP 398.6 (H) 09/13/2015 1158    Micro Results Recent Results (from the past 240 hour(s))  Culture, blood (routine x 2)     Status: None (Preliminary result)   Collection Time: 03/18/20  2:25 AM   Specimen: BLOOD  Result Value Ref Range Status   Specimen Description BLOOD RIGHT HAND  Final   Special Requests   Final     BOTTLES DRAWN AEROBIC AND ANAEROBIC Blood Culture results may not be optimal due to an excessive volume of blood received in culture bottles   Culture   Final    NO GROWTH 3 DAYS Performed at [facility], [place] [address], [place], [place] [address]    Report Status PENDING  Incomplete  SARS Coronavirus 2 by RT PCR (hospital order, performed in [facility] hospital lab) Nasopharyngeal Nasopharyngeal Swab     Status: None   Collection Time: 03/18/20  2:27 AM   Specimen: Nasopharyngeal Swab  Result Value Ref Range Status   SARS Coronavirus 2 NEGATIVE NEGATIVE Final    Comment: (NOTE) SARS-CoV-2 target nucleic acids are NOT DETECTED.  The SARS-CoV-2 RNA is generally detectable in upper and lower respiratory specimens during the acute phase of infection. The lowest concentration of SARS-CoV-2 viral copies this assay can detect is 250 copies / mL. A negative result does not preclude SARS-CoV-2 infection and should not be used as the sole basis for treatment or other [name] management decisions.  A negative result may occur with improper specimen collection / handling, submission of specimen other than nasopharyngeal swab, presence of viral mutation(s) within the areas targeted by this assay, and inadequate number of viral copies (<250 copies / mL). A negative result must be combined with clinical observations, [name] history, and epidemiological information.  Fact Sheet for Patients:   StrictlyIdeas.no  Fact Sheet for Healthcare Providers: BankingDealers.co.za  This test is not yet approved or  cleared by the [place] FDA and has been authorized for detection and/or diagnosis of SARS-CoV-2 by FDA under an Emergency Use Authorization (EUA).  This EUA will remain in effect (meaning this test can be used) for the duration of the COVID-19 declaration under Section 564(b)(1) of the Act, 21 U.S.C. section 360bbb-3(b)(1), unless the  authorization is terminated or revoked sooner.  Performed at [facility], [place] [address] [place], [place] [address]   Culture, blood (routine x 2)     Status: None (Preliminary result)   Collection Time: 03/18/20  2:29 AM   Specimen: BLOOD  Result Value Ref Range Status   Specimen Description BLOOD RIGHT ARM  Final   Special Requests   Final    BOTTLES DRAWN AEROBIC ONLY Blood Culture adequate volume   Culture   Final    NO GROWTH 3 DAYS Performed at [facility], [address], [place], [place] [address]    Report Status PENDING  Incomplete  MRSA PCR Screening     Status: None   Collection Time: 03/18/20  6:13 AM   Specimen: Nasopharyngeal  Result Value Ref Range Status   MRSA by PCR NEGATIVE NEGATIVE Final    Comment:        The GeneXpert MRSA Assay (FDA approved for NASAL specimens only), is one component of a comprehensive MRSA colonization surveillance program. It is not intended to diagnose MRSA infection nor to guide or monitor treatment for MRSA infections. Performed at [facility], [place] [address] [place], [place] [address]     Radiology Reports DG Chest 2 View  Result Date: 03/12/2020 CLINICAL DATA:  Chest pain EXAM: CHEST - 2 VIEW COMPARISON:  12/04/2019, PET CT 12/22/2019 FINDINGS: Post sternotomy changes with left atrial appendage clip. Left-sided pacing device as before. Cardiomegaly with mildly enlarged central pulmonary vessels. No overt edema. No consolidation, pleural effusion, or pneumothorax. Moderate compression deformity of mid to lower thoracic vertebra, new or increased as compared to chest CT from May 2021. IMPRESSION: Cardiomegaly without overt failure. New mild to moderate compression deformity of mid to lower thoracic vertebra. Electronically Signed   By: Donavan Foil M.D.   On: 03/10/2020 20:18   VAS [place] LOWER EXTREMITY VENOUS (DVT)  Result Date: 03/20/2020  Lower Venous DVTStudy Indications: Edema.  Risk Factors: Cancer  Breast. Limitations: Bandages and body habitus. Comparison Study: Performing Technologist: Antonieta Pert RDMS, RVT  Examination Guidelines: A complete evaluation includes B-mode imaging, spectral Doppler, color Doppler, and power Doppler as needed of all accessible portions of each vessel. Bilateral testing is considered an integral part of a complete examination. Limited examinations for reoccurring indications may be performed as noted. The reflux portion of the exam is performed with the [name] in reverse Trendelenburg.  +---------+---------------+---------+-----------+----------+--------------+ RIGHT    CompressibilityPhasicitySpontaneityPropertiesThrombus Aging +---------+---------------+---------+-----------+----------+--------------+ CFV      Full           Yes      Yes                                 +---------+---------------+---------+-----------+----------+--------------+ SFJ      Full                                                        +---------+---------------+---------+-----------+----------+--------------+ FV Prox  Full                                                        +---------+---------------+---------+-----------+----------+--------------+ FV Mid   Full                                                        +---------+---------------+---------+-----------+----------+--------------+ FV DistalFull                                                        +---------+---------------+---------+-----------+----------+--------------+ PFV  Full                                                        +---------+---------------+---------+-----------+----------+--------------+ POP      Full           Yes      Yes                                 +---------+---------------+---------+-----------+----------+--------------+ PTV                                                   Not visualized  +---------+---------------+---------+-----------+----------+--------------+ PERO                                                  Not visualized +---------+---------------+---------+-----------+----------+--------------+ GSV      Full                                                        +---------+---------------+---------+-----------+----------+--------------+   +---------+---------------+---------+-----------+----------+--------------+ LEFT     CompressibilityPhasicitySpontaneityPropertiesThrombus Aging +---------+---------------+---------+-----------+----------+--------------+ CFV      Full                                                        +---------+---------------+---------+-----------+----------+--------------+ SFJ      Full                                                        +---------+---------------+---------+-----------+----------+--------------+ FV Prox  Full                                                        +---------+---------------+---------+-----------+----------+--------------+ FV Mid   Full                                                        +---------+---------------+---------+-----------+----------+--------------+ FV DistalFull                                                        +---------+---------------+---------+-----------+----------+--------------+  PFV      Full                                                        +---------+---------------+---------+-----------+----------+--------------+ POP      Full                                                        +---------+---------------+---------+-----------+----------+--------------+ PTV                                                   Not visualized +---------+---------------+---------+-----------+----------+--------------+ PERO                                                  Not visualized  +---------+---------------+---------+-----------+----------+--------------+ GSV      Full                                                        +---------+---------------+---------+-----------+----------+--------------+ hypoechoic fluid collection posterior left popliteal fossa 3.9cm    Summary: RIGHT: - There is no evidence of deep vein thrombosis in the lower extremity. However, portions of this examination were limited- see technologist comments above.  - No cystic structure found in the popliteal fossa. - calf veins not visulaized due to bandages  LEFT: - There is no evidence of deep vein thrombosis in the lower extremity. However, portions of this examination were limited- see technologist comments above.  - A cystic structure is found in the popliteal fossa. - calf veins not visulaized due to bandages  *See table(s) above for measurements and observations. Electronically signed by Deitra Mayo MD on 03/20/2020 at 5:10:34 PM.    Final      Time Spent in minutes  30     Desiree Hane M.D on 03/21/2020 at 5:59 PM  To page go to [email or web address] - password [facility]

## 2020-03-21 NOTE — Progress Notes (Signed)
Daily Progress Note   Patient Name: [name]       Date: 03/21/2020 DOB: 08-11-1945  Age: 74 y.o. MRN#: [id number] Attending Physician: Desiree Hane, MD Primary Care Physician: Patient, No Pcp Per Admit Date: 03/16/2020  Reason for Consultation/Follow-up: Establishing goals of care  Subjective: [name] is lying in bed, reports feeling much better today. She states that the remeron is helping her with sleep and she has had an increase in her appetite. She is hoping to go home soon.  I encouraged her to bring a copy of her paperwork that designates her grandson as her HCPOA to either the hospital or to one of her physician's offices so that it can be scanned into her chart.  We discussed continued outpatient Palliative at discharge and she is agreeable.    Length of Stay: 3  Current Medications: Scheduled Meds:   amiodarone  200 mg Oral Daily   apixaban  5 mg Oral BID   furosemide  80 mg Intravenous Once   insulin aspart  0-15 Units Subcutaneous TID AC & HS   mirtazapine  7.5 mg Oral QHS   morphine  15 mg Oral Q12H    Continuous Infusions:  sodium chloride 500 mL (03/19/20 0418)   cefTRIAXone (ROCEPHIN)  IV 2 g (03/20/20 1717)   ondansetron (ZOFRAN) IV      PRN Meds: sodium chloride, acetaminophen **OR** acetaminophen, HYDROmorphone (DILAUDID) injection, morphine, ondansetron **OR** ondansetron (ZOFRAN) IV, polyethylene glycol     Vital Signs: BP 109/76 (BP Location: Left Arm)    Pulse (!) 117    Temp 97.7 F (36.5 C) (Oral)    Resp 14    Ht 5\' 4"  (1.626 m)    Wt 76.6 kg    SpO2 96%    BMI 28.99 kg/m  SpO2: SpO2: 96 % O2 Device: O2 Device: Room Air O2 Flow Rate:    Intake/output summary:   Intake/Output Summary (Last 24 hours) at 03/21/2020 1228 Last  data filed at 03/21/2020 0830 Gross per 24 hour  Intake 360 ml  Output 750 ml  Net -390 ml   LBM: Last BM Date: 03/16/20 Baseline Weight: Weight: 77.1 kg Most recent weight: Weight: 76.6 kg       Palliative Assessment/Data: PPS: 50%     Patient Active Problem List  Diagnosis Date Noted   Advanced care planning/counseling discussion    Palliative care by specialist    Reactive depression    Sepsis due to cellulitis ([place]) 03/18/2020   Hypocalcemia 03/18/2020   Cellulitis of left lower extremity 03/18/2020   Hyperkalemia 03/18/2020   Dehydration with hyponatremia 03/18/2020   Lactic acidosis 03/18/2020   Family history of breast cancer    Lynch syndrome    Genetic testing 01/09/2020   Breast cancer metastasized to bone ([place]) 12/06/2019   Goals of care, counseling/discussion 12/06/2019   HTN (hypertension)    Chest pain    Pressure injury of skin 12/05/2016   S/P CABG x 5 12/04/2016   NSTEMI (non-ST elevated myocardial infarction) ([place]) 11/27/2016   Ventricular fibrillation ([place]) 11/27/2016   Head injury    ICD (implantable cardioverter-defibrillator) discharge    Diabetic neuropathy ([place]) 06/16/2016   CKD (chronic kidney disease) stage 3, GFR 30-59 ml/min 06/16/2016   Fatigue 10/30/2015   Paroxysmal atrial fibrillation with rapid ventricular response ([name]) 12/75/1700   Chronic systolic CHF (congestive heart failure) ([place]) 10/02/2015   Atrial flutter ([place]) 09/18/2015   Chronic anticoagulation-Xarelto 09/18/2015   Acute on chronic congestive heart failure ([place]) 09/18/2015   Orthostatic hypotension    ICD in place- St Jude    HLD (hyperlipidemia) 05/11/2015   Syncope 05/11/2015   Abdominal pain 05/11/2015   Acute kidney injury ([place])    Nausea & vomiting    Hypothyroidism 04/27/2015   Chronic diastolic CHF (congestive heart failure) ([name])    Abnormal PFT    CAD (coronary artery disease) 04/21/2015   Acute respiratory  failure ([name])    Ventricular tachycardia- Sept 2016- ICD 04/13/2015   UTI (lower urinary tract infection) 03/31/2015   Sepsis ([place]) 03/31/2015   Atrial fibrillation with RVR ([place]) 17/49/4496   Systolic CHF ([name])    Hypertrophic cardiomyopathy ([place])    Septic bursitis of elbow 02/03/2013   Acute blood loss anemia 01/08/2013   Diabetes mellitus type 2, noninsulin dependent ([place])    Cellulitis 12/31/2012   Fasting hyperglycemia 12/31/2012   Leukocytosis 12/31/2012   Hypotension 12/31/2012    Palliative Care Assessment & Plan   Patient Profile: 74 y.o. female Patient with Stage IV breast cancer, currently s/p one round of nivolumab, has had radiation tx to spine, previously on Ibrance but did not tolerate; CHF with EF 35-40%, atrial fib/flutter- St. Jude ICD in place, HTN, DM2, CKD III.  Currently admitted on 03/20/2020 with sepsis secondary to bilateral lower extremity cellulitis. Admission complicated by afib with RVR- intolerant to many cardiac medications.  Palliative consulted for goals of care.   Assessment/Recommendations/Plan   Continue mirtazapine 7.5mg  QHS- please write for prescription on discharge  Continue full scope care  DNR  TOC referral for outpatient Palliative to follow  Goals of Care and Additional Recommendations:  Limitations on Scope of Treatment: Full Scope Treatment  Code Status:  DNR  Prognosis:   Unable to determine  Discharge Planning:  Home with Palliative Services  Care plan was discussed with patient.   Thank you for allowing the Palliative Medicine Team to assist in the care of this patient.   Time In: 0930 Time Out: 1005 Total Time 35 minutes Prolonged Time Billed no      Greater than 50%  of this time was spent counseling and coordinating care related to the above assessment and plan.  Mariana Kaufman, AGNP-C Palliative Medicine   Please contact Palliative Medicine Team phone at [phone] for questions and concerns.

## 2020-03-21 NOTE — Progress Notes (Signed)
[name] is looking a whole lot better.  She is now sitting up.  She does not have as much pain.  Her legs are still wrapped.  She still has atrial fibrillation.  However, she just sounds stronger.  She just has a better quality of life right now.  Hopefully, the adjustments that were made to her pain medication protocol are helping.  I would like to hope that she will be going home soon.  I know that cardiology is trying her best to get the swelling out of her legs.  This will help with the cellulitis.  She is still on antibiotics.  Her lab studies show white count 12.3.  Hemoglobin 0.3.  Platelet count 235,000.  Her BUN is 52 creatinine 1.67.  Calcium is 6.5.  So far, I think her cultures are negative.  Patient had Doppler of her legs yesterday which did not show any thromboembolic disease.  Again I am just happy that her pain is doing much better.  She is hungry.  She would like to have a regular diet.  Given the "big picture" I really do not think this is going to be a problem for her.  As always, we had a very good prayer at the end of our meeting.  I am just very thankful that her faith is so strong and that her faith will get her through all this.  I know the staff on [facility] are doing a great job with her.  I appreciate their compassion and there professional care.  Lattie Haw, MD  [facility] [phone]

## 2020-03-21 NOTE — Progress Notes (Signed)
Pt's dressings on her lower legs were changed per order without difficulty.  Will continue to monitor.

## 2020-03-21 NOTE — Progress Notes (Signed)
Mobility Specialist - Progress Note   03/21/20 1443  Mobility  Activity Refused mobility    Pt refused mobility due to feeling dizzy.   Pricilla Handler Mobility Specialist Mobility Specialist Phone: [phone]

## 2020-03-21 NOTE — Progress Notes (Addendum)
Advanced Heart Failure Rounding Note  PCP-Cardiologist: No primary care provider on file.     Patient Profile   74 y/o woman with recently diagnosed metastatic breast CA (undergoing XRT), CAD s/p CABG, DM2, systolic HF with EF 65-78% and PAF.   Admitted with sepsis and lactic acidosis due to severe LE cellulitis in setting of marked volume overload. Course complicated by AF with RVR.   Subjective:   Yesterday diuresed with IV lasix.   On Vanc + meropenum. AF. WBC trending down, 23>>21>>18>12K. BC NGTD.   SCr trending down, 2.08>>1.90>>1.69>>1.67. BUN 56>>53>>52>>52    LE Venous Dopplers negative for DVT.    Feeling better today. Denies SOB.    Objective:   Weight Range: 76.6 kg Body mass index is 28.99 kg/m.   Vital Signs:   Temp:  [97.6 F (36.4 C)-98 F (36.7 C)] 97.7 F (36.5 C) (08/18 1111) Pulse Rate:  [76-125] 117 (08/18 1111) Resp:  [14-20] 14 (08/18 1111) BP: (98-109)/(69-79) 109/76 (08/18 1111) SpO2:  [94 %-97 %] 96 % (08/18 1111) Weight:  [76.6 kg] 76.6 kg (08/18 0557) Last BM Date: 03/16/20  Weight change: Filed Weights   03/18/20 0523 03/20/20 0515 03/21/20 0557  Weight: 74 kg 74 kg 76.6 kg    Intake/Output:   Intake/Output Summary (Last 24 hours) at 03/21/2020 1151 Last data filed at 03/21/2020 0830 Gross per 24 hour  Intake 360 ml  Output 750 ml  Net -390 ml      Physical Exam   General: In bed. No resp difficulty HEENT: normal Neck: supple. JVP 11-12. Carotids 2+ bilat; no bruits. No lymphadenopathy or thryomegaly appreciated. Cor: PMI nondisplaced. Irregular rate & rhythm. No rubs, gallops or murmurs. Lungs: clear Abdomen: soft, nontender, nondistended. No hepatosplenomegaly. No bruits or masses. Good bowel sounds. Extremities: no cyanosis, clubbing, rash, R and LLE 2+ with some partial thickness skin loss.  Neuro: alert & orientedx3, cranial nerves grossly intact. moves all 4 extremities w/o difficulty. Affect  pleasant   Telemetry   A fib 120s   EKG    No new EKG to review   Labs    CBC Recent Labs    03/20/20 0248 03/21/20 0449  WBC 18.1* 12.3*  NEUTROABS 16.2* 10.1*  HGB 11.1* 11.3*  HCT 36.5 37.3  MCV 86.9 88.6  PLT 229 469   Basic Metabolic Panel Recent Labs    03/19/20 0151 03/19/20 0151 03/20/20 0248 03/21/20 0449  NA 132*   < > 133* 132*  K 5.4*   < > 4.7 4.4  CL 100   < > 101 101  CO2 16*   < > 23 21*  GLUCOSE 67*   < > 138* 118*  BUN 53*   < > 52* 52*  CREATININE 1.90*   < > 1.69* 1.67*  CALCIUM 6.2*   < > 6.5* 6.5*  MG 2.5*  --   --   --   PHOS 4.3  --   --   --    < > = values in this interval not displayed.   Liver Function Tests No results for input(s): AST, ALT, ALKPHOS, BILITOT, PROT, ALBUMIN in the last 72 hours. No results for input(s): LIPASE, AMYLASE in the last 72 hours. Cardiac Enzymes No results for input(s): CKTOTAL, CKMB, CKMBINDEX, TROPONINI in the last 72 hours.  BNP: BNP (last 3 results) Recent Labs    12/04/19 1324 03/18/20 0229  BNP 581.1* 912.0*    ProBNP (last 3 results) No results  for input(s): PROBNP in the last 8760 hours.   D-Dimer No results for input(s): DDIMER in the last 72 hours. Hemoglobin A1C No results for input(s): HGBA1C in the last 72 hours. Fasting Lipid Panel No results for input(s): CHOL, HDL, LDLCALC, TRIG, CHOLHDL, LDLDIRECT in the last 72 hours. Thyroid Function Tests No results for input(s): TSH, T4TOTAL, T3FREE, THYROIDAB in the last 72 hours.  Invalid input(s): FREET3  Other results:   Imaging    VAS [place] LOWER EXTREMITY VENOUS (DVT)  Result Date: 03/20/2020  Lower Venous DVTStudy Indications: Edema.  Risk Factors: Cancer Breast. Limitations: Bandages and body habitus. Comparison Study: Performing Technologist: Antonieta Pert RDMS, RVT  Examination Guidelines: A complete evaluation includes B-mode imaging, spectral Doppler, color Doppler, and power Doppler as needed of all accessible  portions of each vessel. Bilateral testing is considered an integral part of a complete examination. Limited examinations for reoccurring indications may be performed as noted. The reflux portion of the exam is performed with the patient in reverse Trendelenburg.  +---------+---------------+---------+-----------+----------+--------------+ RIGHT    CompressibilityPhasicitySpontaneityPropertiesThrombus Aging +---------+---------------+---------+-----------+----------+--------------+ CFV      Full           Yes      Yes                                 +---------+---------------+---------+-----------+----------+--------------+ SFJ      Full                                                        +---------+---------------+---------+-----------+----------+--------------+ FV Prox  Full                                                        +---------+---------------+---------+-----------+----------+--------------+ FV Mid   Full                                                        +---------+---------------+---------+-----------+----------+--------------+ FV DistalFull                                                        +---------+---------------+---------+-----------+----------+--------------+ PFV      Full                                                        +---------+---------------+---------+-----------+----------+--------------+ POP      Full           Yes      Yes                                 +---------+---------------+---------+-----------+----------+--------------+  PTV                                                   Not visualized +---------+---------------+---------+-----------+----------+--------------+ PERO                                                  Not visualized +---------+---------------+---------+-----------+----------+--------------+ GSV      Full                                                         +---------+---------------+---------+-----------+----------+--------------+   +---------+---------------+---------+-----------+----------+--------------+ LEFT     CompressibilityPhasicitySpontaneityPropertiesThrombus Aging +---------+---------------+---------+-----------+----------+--------------+ CFV      Full                                                        +---------+---------------+---------+-----------+----------+--------------+ SFJ      Full                                                        +---------+---------------+---------+-----------+----------+--------------+ FV Prox  Full                                                        +---------+---------------+---------+-----------+----------+--------------+ FV Mid   Full                                                        +---------+---------------+---------+-----------+----------+--------------+ FV DistalFull                                                        +---------+---------------+---------+-----------+----------+--------------+ PFV      Full                                                        +---------+---------------+---------+-----------+----------+--------------+ POP      Full                                                        +---------+---------------+---------+-----------+----------+--------------+  PTV                                                   Not visualized +---------+---------------+---------+-----------+----------+--------------+ PERO                                                  Not visualized +---------+---------------+---------+-----------+----------+--------------+ GSV      Full                                                        +---------+---------------+---------+-----------+----------+--------------+ hypoechoic fluid collection posterior left popliteal fossa 3.9cm    Summary: RIGHT: - There is no evidence of deep vein  thrombosis in the lower extremity. However, portions of this examination were limited- see technologist comments above.  - No cystic structure found in the popliteal fossa. - calf veins not visulaized due to bandages  LEFT: - There is no evidence of deep vein thrombosis in the lower extremity. However, portions of this examination were limited- see technologist comments above.  - A cystic structure is found in the popliteal fossa. - calf veins not visulaized due to bandages  *See table(s) above for measurements and observations. Electronically signed by Deitra Mayo MD on 03/20/2020 at 5:10:34 PM.    Final      Medications:     Scheduled Medications: . amiodarone  200 mg Oral Daily  . apixaban  5 mg Oral BID  . furosemide  80 mg Intravenous Once  . insulin aspart  0-15 Units Subcutaneous TID AC & HS  . mirtazapine  7.5 mg Oral QHS  . morphine  15 mg Oral Q12H    Infusions: . sodium chloride 500 mL (03/19/20 0418)  . cefTRIAXone (ROCEPHIN)  IV 2 g (03/20/20 1717)  . ondansetron (ZOFRAN) IV      PRN Medications: sodium chloride, acetaminophen **OR** acetaminophen, HYDROmorphone (DILAUDID) injection, morphine, ondansetron **OR** ondansetron (ZOFRAN) IV, polyethylene glycol   Assessment/Plan    1. Atrial Fibrillation with RVR - S/p MAZE and LAA clipping at time of CABG in 11/2016. S/p DCCV in 09/2015 and more recently 12/09/2019. Noted to be back in atrial fibrillation at last office visit on 01/30/2020 and started on low dose PO Amiodarone. - Presented in atrial fibrillation with RVR in the setting of sepsis secondary to cellulitis of bilateral lower extremities. Difficult situation complicated by patient's intolerance to multiple cardiac meds. V-tates currently in the 120s. - Not been able to tolerate IV Amiodarone due to severe nausea. - Not able to tolerate beta-blocker due to hypotension with syncope. - Calcium channel blockers not a good option with reduced EF. - Digoxin not a  good option with current AKI.  - continue to treat w/ low dose PO amio, 200 mg daily + treatment of acute infection  - Continue Eliquis 5mg  twice daily.    2. Acute on Chronic Combined CHF - History of ischemic cardiomyopathy. PYP in the past negative for amyloid. - Most recent Echo in 12/2019 showed LVEF of 35-40% with severe asymmetric LVH, mild MR, moderate TR, and  severely elevated PASP. - Chest x-ray showed cardiomegaly with mildly enlarged central pulmonary vessel but no overt edema. BNP 912 - Home Lasix held on admit and patient actually given some IV fluids on admission due to sepsis/ AKI. - Continue IV lasix for now.   -- intolerant to Losartan due to hyperkalemia    3. CAD  - S/p CABG x4 in 11/2016.  - High-sensitivity troponin minimally elevated and flat at 22 >> 24. Not consistent with ACS. Suspect demand ischemia in setting of sepsis and atrial fibrillation with RVR. - She reports constant left sided flank/breast pain that is worse with inspiration for the last several months. Sounds like this may be secondary to radiation. Nothing that sounds like angina. - No Aspirin with need for Eliquis.  - Continue statin.  4. 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 Had VF arrest in 11/2016 with appropriate ICD shock.  - No recurrent VT/VF.  - Had not been able to tolerate Amiodarone or beta-blocker in the past.  5. Sepsis Secondary to Bilateral Lower Extremity Cellulitis - on broad spectrum abx w/ vanc + meropenum. AF. Lactic acid normalized. WBC trending down. Blood cultures NGTD   6. 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 - Diagnosed in 12/2019. Lynch syndrome diagnosed by genetic study.  - Follows with Dr. Marin Olp as outpatient. On immunotherapy   7. Hyperkalemia - resolved today  8. Type 2 Diabetes Mellitus - Management per primary team.  9. Acute on Chronic Kidney Disease - baseline SCr 1.0-1.30 range  - Creatinine 2.20 on admission, sepsis  and deceased PO intake with constant nausea and frequent vomiting - improving w/ diuretic hold and IVFs, creatinine back down to 1.67   Continue to diurese.   Length of Stay: 3  Darrick Grinder, NP  03/21/2020, 11:51 AM   Patient seen and examined with the above-signed Advanced Practice Provider and/or Housestaff. I personally reviewed laboratory data, imaging studies and relevant notes. I independently examined the patient and formulated the important aspects of the plan. I have edited the note to reflect any of my changes or salient points. I have personally discussed the plan with the patient and/or family.  She looks stronger today. Denies SOB, orthopnea or PND. Still having pain. Remains in AF with RVR but rate coming down with oral amio and treatment of cellulitis. Diuresing slowly with IV lasix   General:  Sitting up in bed. No resp difficulty HEENT: normal Neck: supple. JVP to jaw. Carotids 2+ bilat; no bruits. No lymphadenopathy or thryomegaly appreciated. Cor: PMI nondisplaced. Irregular tachy. No rubs, gallops or murmurs. Lungs: clear Abdomen: soft, nontender, nondistended. No hepatosplenomegaly. No bruits or masses. Good bowel sounds. Extremities: no cyanosis, clubbing, rash, 2-3+ edema wrapped wounds Neuro: alert & orientedx3, cranial nerves grossly intact. moves all 4 extremities w/o difficulty. Affect pleasant  Remains volume overloaded. Continue IV lasix. Elevate legs. Give one dose metolazone. Continue po amio and apixaban for AF.   Advanced Heart Failure Team Pager [phone] (M-F; [place])  Please contact [facility] for night-coverage after hours (4p -7a ) and weekends on [email or web address]

## 2020-03-22 ENCOUNTER — Inpatient Hospital Stay (HOSPITAL_COMMUNITY): Payer: PPO

## 2020-03-22 DIAGNOSIS — M8458XA Pathological fracture in neoplastic disease, other specified site, initial encounter for fracture: Secondary | ICD-10-CM

## 2020-03-22 LAB — GLUCOSE, CAPILLARY
Glucose-Capillary: 72 mg/dL (ref 70–99)
Glucose-Capillary: 95 mg/dL (ref 70–99)
Glucose-Capillary: 81 mg/dL (ref 70–99)
Glucose-Capillary: 91 mg/dL (ref 70–99)
Glucose-Capillary: 117 mg/dL — ABNORMAL HIGH (ref 70–99)

## 2020-03-22 LAB — BASIC METABOLIC PANEL
Anion gap: 12 (ref 5–15)
BUN: 50 mg/dL — ABNORMAL HIGH (ref 8–23)
CO2: 20 mmol/L — ABNORMAL LOW (ref 22–32)
Calcium: 6.6 mg/dL — ABNORMAL LOW (ref 8.9–10.3)
Chloride: 99 mmol/L (ref 98–111)
Creatinine, Ser: 1.52 mg/dL — ABNORMAL HIGH (ref 0.44–1.00)
GFR calc Af Amer: 39 mL/min — ABNORMAL LOW (ref >60)
GFR calc non Af Amer: 34 mL/min — ABNORMAL LOW (ref >60)
Glucose, Bld: 99 mg/dL (ref 70–99)
Potassium: 5.1 mmol/L (ref 3.5–5.1)
Sodium: 131 mmol/L — ABNORMAL LOW (ref 135–145)

## 2020-03-22 LAB — CBC
HCT: 37.7 % (ref 36.0–46.0)
Hemoglobin: 11.3 g/dL — ABNORMAL LOW (ref 12.0–15.0)
MCH: 26.4 pg (ref 26.0–34.0)
MCHC: 30 g/dL (ref 30.0–36.0)
MCV: 88.1 fL (ref 80.0–100.0)
Platelets: 247 10*3/uL (ref 150–400)
RBC: 4.28 MIL/uL (ref 3.87–5.11)
RDW: 23.2 % — ABNORMAL HIGH (ref 11.5–15.5)
WBC: 12.5 10*3/uL — ABNORMAL HIGH (ref 4.0–10.5)
nRBC: 0.3 % — ABNORMAL HIGH (ref 0.0–0.2)

## 2020-03-22 MED ORDER — PROMETHAZINE HCL 25 MG/ML IJ SOLN
12.5000 mg | Freq: Four times a day (QID) | INTRAMUSCULAR | Status: DC | PRN
  Filled 2020-03-22: qty 1

## 2020-03-22 MED ORDER — FUROSEMIDE 10 MG/ML IJ SOLN
80.0000 mg | Freq: Every day | INTRAMUSCULAR | Status: DC
  Administered 2020-03-23 – 2020-03-28 (×6): 80 mg via INTRAVENOUS
  Filled 2020-03-22 (×6): qty 8

## 2020-03-22 MED ORDER — FUROSEMIDE 10 MG/ML IJ SOLN
80.0000 mg | Freq: Once | INTRAMUSCULAR | Status: AC
  Administered 2020-03-22: 80 mg via INTRAVENOUS
  Filled 2020-03-22: qty 8

## 2020-03-22 MED ORDER — KETOROLAC TROMETHAMINE 15 MG/ML IJ SOLN
30.0000 mg | Freq: Three times a day (TID) | INTRAMUSCULAR | Status: AC
  Administered 2020-03-22 (×3): 30 mg via INTRAVENOUS
  Filled 2020-03-22 (×3): qty 2

## 2020-03-22 NOTE — Progress Notes (Signed)
PT Cancellation Note  Patient Details Name: [name] MRN: [id number] DOB: 09-27-1945   Cancelled Treatment:    Reason Eval/Treat Not Completed: Pain limiting ability to participate (pt reports pain 8/10 and denied any mobility or activity at this time despite Dilaudid.)   Audley Hinojos B Taiana Temkin 03/22/2020, 7:29 AM  Bayard Males, PT Acute Rehabilitation Services Pager: [phone] Office: [phone]

## 2020-03-22 NOTE — Progress Notes (Addendum)
Physical Therapy Treatment Patient Details Name: [name] MRN: [id number] DOB: 1946/04/18 Today's Date: 03/22/2020    History of Present Illness SHAWNTIA MANGAL is a 74 y.o. 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 fib, ventricular tachycardia status post ICD in situ, breast cancer with bone mets, Lynch syndrome, Patient presented to the ED on 03/14/2020 with complaint of shortness of breath, worsening redness on both upper legs with weeping lesions    PT Comments    Pt received Toradol prior to session and morphine during session with pt reporting having not eaten breakfast. Pt with nausea and dry heaves with transition to sitting and required increased time in sitting to receive zofran and calm nausea prior to HEP. Pt performing HEP when breakfast tray arrived, pt then refused any further activity or transfers. Pt able to state understanding that she has to mobilize to return home but has yet to transfer with therapy and per pt report has been getting to [facility], [facility], [facility]. Pt reports family will provide 24hr assist but none present to confirm. Encouraged further mobility with nursing and HEp during the day. Pt limited by left chest pain.    Follow Up Recommendations  Supervision/Assistance - 24 hour;SNF (pt states she will refuse)     Equipment Recommendations  3in1 (PT)    Recommendations for Other Services       Precautions / Restrictions Precautions Precautions: Fall Restrictions Weight Bearing Restrictions: No    Mobility  Bed Mobility Overal bed mobility: Modified Independent             General bed mobility comments: pt able to transition from supine to sitting EOB with use of rail and HOB 20 degrees without further physical assist  Transfers                 General transfer comment: pt refused to attempt standing  Ambulation/Gait                 Stairs             Wheelchair  Mobility    Modified Rankin (Stroke Patients Only)       Balance   Sitting-balance support: No upper extremity supported;Feet supported Sitting balance-Leahy Scale: Good                                      Cognition Arousal/Alertness: Awake/alert Behavior During Therapy: WFL for tasks assessed/performed Overall Cognitive Status: No family/caregiver present to determine baseline cognitive functioning Area of Impairment: Safety/judgement                         Safety/Judgement: Decreased awareness of deficits     General Comments: pt exhibits self limiting behavior due to pain      Exercises General Exercises - Lower Extremity Long Arc Quad: AROM;Both;Seated;20 reps Hip Flexion/Marching: AAROM;Both;Seated;20 reps    General Comments        Pertinent Vitals/Pain Pain Score: 8  Pain Location: left chest Pain Descriptors / Indicators: Aching;Discomfort Pain Intervention(s): Limited activity within patient's tolerance;Monitored during session;Premedicated before session    Home Living                      Prior Function            PT Goals (current goals  can now be found in the care plan section) Progress towards PT goals: Not progressing toward goals - comment    Frequency    Min 3X/week      PT Plan Discharge plan needs to be updated    Co-evaluation              AM-PAC PT "6 Clicks" Mobility   Outcome Measure  Help needed turning from your back to your side while in a flat bed without using bedrails?: None Help needed moving from lying on your back to sitting on the side of a flat bed without using bedrails?: None Help needed moving to and from a bed to a chair (including a wheelchair)?: Total Help needed standing up from a chair using your arms (e.g., wheelchair or bedside chair)?: Total Help needed to walk in hospital room?: Total Help needed climbing 3-5 steps with a railing? : Total 6 Click Score:  12    End of Session   Activity Tolerance: Patient limited by pain Patient left: in bed;with call bell/phone within reach Nurse Communication: Mobility status PT Visit Diagnosis: Unsteadiness on feet (R26.81);Difficulty in walking, not elsewhere classified (R26.2);Pain     Time: 7215-8727 PT Time Calculation (min) (ACUTE ONLY): 23 min  Charges:  $Therapeutic Exercise: 8-22 mins $Therapeutic Activity: 8-22 mins                     Ercell Perlman P, PT Acute Rehabilitation Services Pager: [phone] Office: [place] 03/22/2020, 11:20 AM

## 2020-03-22 NOTE — Progress Notes (Addendum)
Advanced Heart Failure Rounding Note  PCP-Cardiologist: No primary care provider on file.     Patient Profile   74 y/o woman with recently diagnosed metastatic breast CA (undergoing XRT), CAD s/p CABG, DM2, systolic HF with EF 42-35% and PAF.   Admitted with sepsis and lactic acidosis due to severe LE cellulitis in setting of marked volume overload. Course complicated by AF with RVR.   Subjective:    I/Os not charted yesterday but wt down 9 lb.  No labs drawn today. Checking BMP   abx narrowed down to Rocephin. AF.    Remains in afib, rates in the 110s. BP stable.   Continues w/ nausea. Main complaint today is rt sided rib pain.    Objective:   Weight Range: 72.4 kg Body mass index is 27.4 kg/m.   Vital Signs:   Temp:  [97.6 F (36.4 C)-97.9 F (36.6 C)] 97.7 F (36.5 C) (08/19 0405) Pulse Rate:  [99-117] 113 (08/19 0405) Resp:  [14-24] 24 (08/19 0500) BP: (96-126)/(59-93) 126/93 (08/19 0405) SpO2:  [93 %-96 %] 94 % (08/19 0405) Weight:  [72.4 kg] 72.4 kg (08/19 0500) Last BM Date: 03/22/20  Weight change: Filed Weights   03/20/20 0515 03/21/20 0557 03/22/20 0500  Weight: 74 kg 76.6 kg 72.4 kg    Intake/Output:   Intake/Output Summary (Last 24 hours) at 03/22/2020 0737 Last data filed at 03/22/2020 0400 Gross per 24 hour  Intake 610 ml  Output --  Net 610 ml      Physical Exam    General: fatigue appearing, no resp difficulty HEENT: normal Neck: supple. JVP 11-12. Carotids 2+ bilat; no bruits. No lymphadenopathy or thryomegaly appreciated. Cor: PMI nondisplaced. Irregularly irregular rate & rhythm. No rubs, gallops or murmurs. Lungs: clear Abdomen: soft, nontender, nondistended. No hepatosplenomegaly. No bruits or masses. Good bowel sounds. Extremities: no cyanosis, clubbing, rash, 2+ bilateral LEE up to knees both legs wrapped Neuro: alert & orientedx3, cranial nerves grossly intact. moves all 4 extremities w/o difficulty. Affect  pleasant   Telemetry   A fib 110s-120s   EKG    No new EKG to review   Labs    CBC Recent Labs    03/20/20 0248 03/21/20 0449  WBC 18.1* 12.3*  NEUTROABS 16.2* 10.1*  HGB 11.1* 11.3*  HCT 36.5 37.3  MCV 86.9 88.6  PLT 229 361   Basic Metabolic Panel Recent Labs    03/20/20 0248 03/21/20 0449  NA 133* 132*  K 4.7 4.4  CL 101 101  CO2 23 21*  GLUCOSE 138* 118*  BUN 52* 52*  CREATININE 1.69* 1.67*  CALCIUM 6.5* 6.5*   Liver Function Tests No results for input(s): AST, ALT, ALKPHOS, BILITOT, PROT, ALBUMIN in the last 72 hours. No results for input(s): LIPASE, AMYLASE in the last 72 hours. Cardiac Enzymes No results for input(s): CKTOTAL, CKMB, CKMBINDEX, TROPONINI in the last 72 hours.  BNP: BNP (last 3 results) Recent Labs    12/04/19 1324 03/18/20 0229  BNP 581.1* 912.0*    ProBNP (last 3 results) No results for input(s): PROBNP in the last 8760 hours.   D-Dimer No results for input(s): DDIMER in the last 72 hours. Hemoglobin A1C No results for input(s): HGBA1C in the last 72 hours. Fasting Lipid Panel No results for input(s): CHOL, HDL, LDLCALC, TRIG, CHOLHDL, LDLDIRECT in the last 72 hours. Thyroid Function Tests No results for input(s): TSH, T4TOTAL, T3FREE, THYROIDAB in the last 72 hours.  Invalid input(s): FREET3  Other  results:   Imaging    No results found.   Medications:     Scheduled Medications: . amiodarone  200 mg Oral Daily  . apixaban  5 mg Oral BID  . furosemide  80 mg Intravenous Once  . insulin aspart  0-15 Units Subcutaneous TID AC & HS  . ketorolac  30 mg Intravenous Q8H  . mirtazapine  7.5 mg Oral QHS  . morphine  15 mg Oral Q12H  . senna  2 tablet Oral BID    Infusions: . sodium chloride 500 mL (03/19/20 0418)  . cefTRIAXone (ROCEPHIN)  IV 2 g (03/21/20 1421)  . ondansetron (ZOFRAN) IV      PRN Medications: sodium chloride, acetaminophen **OR** acetaminophen, HYDROmorphone (DILAUDID) injection,  morphine, ondansetron **OR** ondansetron (ZOFRAN) IV, polyethylene glycol   Assessment/Plan    1. Atrial Fibrillation with RVR - S/p MAZE and LAA clipping at time of CABG in 11/2016. S/p DCCV in 09/2015 and more recently 12/09/2019. Noted to be back in atrial fibrillation at last office visit on 01/30/2020 and started on low dose PO Amiodarone. - Presented in atrial fibrillation with RVR in the setting of sepsis secondary to cellulitis of bilateral lower extremities. Difficult situation complicated by patient's intolerance to multiple cardiac meds. V-tates currently in the 110s- 120s. - Not been able to tolerate IV Amiodarone due to severe nausea. - Not able to tolerate beta-blocker due to hypotension with syncope. - Calcium channel blockers not a good option with reduced EF. - Digoxin not a good option with current AKI.  - continue to treat w/ low dose PO amio, 200 mg daily + treatment of acute infection  - Continue Eliquis 5mg  twice daily.    2. Acute on Chronic Combined CHF - History of ischemic cardiomyopathy. PYP in the past negative for amyloid. - Most recent Echo in 12/2019 showed LVEF of 35-40% with severe asymmetric LVH, mild MR, moderate TR, and severely elevated PASP. - Chest x-ray showed cardiomegaly with mildly enlarged central pulmonary vessel but no overt edema. BNP 912 - Home Lasix held on admit and patient actually given some IV fluids on admission due to sepsis/ AKI. Diuretics restarted 8/17. Remains volume overloaded.  - Continue 80 mg  IV lasix. Check BMP. If SCr/K stable, will re dose metolazone.  -intolerant to Losartan due to hyperkalemia    3. CAD  - S/p CABG x4 in 11/2016.  - High-sensitivity troponin minimally elevated and flat at 22 >> 24. Not consistent with ACS. Suspect demand ischemia in setting of sepsis and atrial fibrillation with RVR. - She reports constant left sided flank/breast pain that is worse with inspiration for the last several months. Sounds like  this may be secondary to radiation. Nothing that sounds like angina. - No Aspirin with need for Eliquis.  - Continue statin.  4. 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 Had VF arrest in 11/2016 with appropriate ICD shock.  - No recurrent VT/VF.  - Had not been able to tolerate Amiodarone or beta-blocker in the past.  5. Sepsis Secondary to Bilateral Lower Extremity Cellulitis - initially placed on broad spectrum abx w/ vanc + meropenum. AF. Lactic acid normalized.  Blood cultures NGTD  - abx have been narrowed to Rocephin   6. 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 - Diagnosed in 12/2019. Lynch syndrome diagnosed by genetic study.  - Follows with Dr. Marin Olp as outpatient. On immunotherapy   7. Hyperkalemia - BMP pending   8. Type  2 Diabetes Mellitus - Management per primary team.  9. Acute on Chronic Kidney Disease - baseline SCr 1.0-1.30 range  - Creatinine 2.20 on admission, sepsis and deceased PO intake with constant nausea and frequent vomiting - check BMP today   Continue to diurese.   Length of Stay: [address], [place]  03/22/2020, 7:37 AM    Advanced Heart Failure Team Pager [phone] (M-F; 7a - 4p)  Please contact [facility] for night-coverage after hours (4p -7a ) and weekends on [email or web address]  Patient seen and examined with the above-signed Advanced Practice Provider and/or Housestaff. I personally reviewed laboratory data, imaging studies and relevant notes. I independently examined the patient and formulated the important aspects of the plan. I have edited the note to reflect any of my changes or salient points. I have personally discussed the plan with the patient and/or family.  Diuresing well. Legs still swollen. Remains in AF. C/o rib pain   General:  Sitting up in bed. No resp difficulty HEENT: normal Neck: supple. JVP 10. Carotids 2+ bilat; no bruits. No lymphadenopathy or thryomegaly appreciated. Cor: PMI  nondisplaced. Irregular tachy No rubs, gallops or murmurs. Lungs: clear Abdomen: soft, nontender, nondistended. No hepatosplenomegaly. No bruits or masses. Good bowel sounds. Extremities: no cyanosis, clubbing, rash, 2+ edema wrapped due to wounds Neuro: alert & orientedx3, cranial nerves grossly intact. moves all 4 extremities w/o difficulty. Affect pleasant  Continue to diurese. Creatinine ok.  Continue abx for cellulitis. Continue po amio for AF (refuses IV or higher dosing). Continue apixaban.   Glori Bickers, MD  11:47 AM

## 2020-03-22 NOTE — Progress Notes (Signed)
[facility]  PROGRESS NOTE  [name] SPQ:[id number] DOB: 1945/10/26 DOA: 03/29/2020 PCP: Patient, No Pcp Per Admit date - 03/20/2020   Admitting Physician Vernelle Emerald, MD  Outpatient Primary MD for the patient is Patient, No Pcp Per  LOS - 4 Brief Narrative   [name] is a 74 y.o. 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 fib status post ICD, breast cancer with bone mets/Lynch syndrome who presented on 8/14 with several days of shortness of breath, worsening redness of lower legs and weeping lesions for the past 2 weeks in setting of using Derm stone to scrape her dry skin and found to have A. fib with RVR to the 120s and sepsis physiology (white count of 24.5, lactic acid 2.2, Respiratory rate of 21) secondary to bilateral lower extremity cellulitis course complicated by A. fib with RVR, AKI on CKD with hyperkalemia    Subjective  Main complaint is persistent left rib cage pain this started last night.  Denies any chest pain.  Legs continue to weep  A & P  Sepsis secondary to nonpurulent cellulitis of bilateral lower extremities.  Unfortunate consequence of exfoliativestone use to skin.  Remains afebrile, white count downtrending, blood cultures unremarkable, sepsis physiology resolved -Continue IV ceftriaxone, anticipate transitioning to oral for coverage of MSSA (likely culprit)  A. fib with RVR, likely exacerbated in setting of sepsis due to above.  Heart rate remains in the 110s to 120s .  Patient's been unable to tolerate IV amiodarone due to nausea in the past, CCB is not an option with decreased EF, hypotension to prior beta-blockade (patient declines IV amiodarone or higher p.o. dosing) -Appreciate cardiology recommendations, continue low-dose p.o. amiodarone 200 mg daily -Hopeful continue  treatment of infection will improve heart rates  Acute on chronic, systolic/diastolic CHF.  Still quite volume overloaded on exam, some slight improvement in bilateral lower extremity swelling with 2-3+ pitting edema bilateral lower extremities.  In the setting of sepsis on admission and holding home diuretics.  BNP in the 900s on admission -Appreciate heart failure recommendations, continue IV Lasix, monitor output and volume status -Intolerant to losartan due to hyperkalemia  Slightly elevated high-sensitivity troponin CAD status post CABG (2018).  Suspect demand ischemia in setting of sepsis and A. fib with RVR Not having any anginal equivalents, does have chronic constant left-sided flank pain for several months related to radiation-continue home Eliquis and statin  Worsening left-sided rib cage pain, found to have worsening pre-existing pathologic fractures in thoracic spine on CT chest (T5 and T8) and development of paraspinal soft tissue soft tissue lumbar spine concerning for worsening malignancy -Continue pain control with Home MS Contin, as needed IV Dilaudid, IV Toradol 30 mg every 8 hours for 3 doses -Monitor BMP  History of VT/V. fib, status post ICD (2016), history of V. fib arrest in 11/2016 -Has not been able to tolerate amiodarone or beta blockade in the past -Currently on amiodarone 20 mg daily  Metastatic breast cancer with bone mets.  Diagnosed 12/2019. -Continue outpatient follow-up with Dr. Marin Olp, on immunotherapy.  Type 2 diabetes, A1c 7.2 -Holding home glipizide -Monitor CBG, sliding scale as needed  Acute on CKD with hyperkalemia , resolved. Peak creatinine of 2.2, now back to baseline of 1.6, and potassium elevation now resolved.  Likely prerenal in the setting of sepsis  etiology improved with IV fluids -Avoid nephrotoxins -Monitor BMP     Family Communication  : None  Code Status : DNR, discussed on day of admission  Disposition Plan  :  Patient is from  home. Anticipated d/c date: 2 to 3 days. Barriers to d/c or necessity for inpatient status:  Needs continued IV Lasix for volume status, currently on IV ceftriaxone will transition to oral in 24 hours if remains clinically improved Consults  : Advanced heart failure, oncology  Procedures  : Venous duplex 8/18  DVT Prophylaxis  : Eliquis Lab Results  Component Value Date   PLT 247 03/22/2020    Diet :  Diet Order            Diet regular Room service appropriate? Yes; Fluid consistency: Thin  Diet effective now                  Inpatient Medications Scheduled Meds: . amiodarone  200 mg Oral Daily  . apixaban  5 mg Oral BID  . insulin aspart  0-15 Units Subcutaneous TID AC & HS  . ketorolac  30 mg Intravenous Q8H  . mirtazapine  7.5 mg Oral QHS  . morphine  15 mg Oral Q12H  . senna  2 tablet Oral BID   Continuous Infusions: . sodium chloride 500 mL (03/19/20 0418)  . cefTRIAXone (ROCEPHIN)  IV 2 g (03/21/20 1421)  . ondansetron (ZOFRAN) IV     PRN Meds:.sodium chloride, acetaminophen **OR** acetaminophen, HYDROmorphone (DILAUDID) injection, morphine, ondansetron **OR** ondansetron (ZOFRAN) IV, polyethylene glycol  Antibiotics  :   Anti-infectives (From admission, onward)   Start     Dose/Rate Route Frequency Ordered Stop   03/20/20 1430  cefTRIAXone (ROCEPHIN) 2 g in sodium chloride 0.9 % 100 mL IVPB        2 g 200 mL/hr over 30 Minutes Intravenous Every 24 hours 03/20/20 1345     03/20/20 0600  vancomycin (VANCOREADY) IVPB 1250 mg/250 mL  Status:  Discontinued        1,250 mg 166.7 mL/hr over 90 Minutes Intravenous Every 48 hours 03/18/20 0230 03/20/20 1345   03/18/20 0330  vancomycin (VANCOREADY) IVPB 1500 mg/300 mL        1,500 mg 150 mL/hr over 120 Minutes Intravenous  Once 03/18/20 0230 03/18/20 0803   03/18/20 0330  meropenem (MERREM) 1 g in sodium chloride 0.9 % 100 mL IVPB  Status:  Discontinued        1 g 200 mL/hr over 30 Minutes Intravenous 2 times daily  03/18/20 0230 03/20/20 1345   03/18/20 0130  piperacillin-tazobactam (ZOSYN) IVPB 3.375 g  Status:  Discontinued        3.375 g 100 mL/hr over 30 Minutes Intravenous  Once 03/18/20 0115 03/18/20 0214       Objective   Vitals:   03/22/20 0420 03/22/20 0500 03/22/20 0821 03/22/20 1138  BP:   (!) 94/55 107/73  Pulse:   (!) 121 (!) 112  Resp: 16 (!) 24 20 20   Temp:   (!) 97.4 F (36.3 C) (!) 97.4 F (36.3 C)  TempSrc:   Oral Oral  SpO2:   91% 94%  Weight:  72.4 kg    Height:        SpO2: 94 % O2 Flow Rate (L/min): 0 L/min  Wt Readings from Last 3 Encounters:  03/22/20 72.4 kg  01/30/20 82.9 kg  12/28/19 82.9 kg     Intake/Output Summary (Last 24 hours) at 03/22/2020 1351  Last data filed at 03/22/2020 0400 Gross per 24 hour  Intake 490 ml  Output --  Net 490 ml    Physical Exam:     Awake Alert, Oriented X 3, Normal affect No new F.N deficits,  [place].AT, Normal respiratory effort on room air, CTAB Irregularly irregular heart rhythm, normal rate, 2-3+ pitting edema bilateral lower extremities to knee +ve B.Sounds, Abd Soft, No tenderness, No rebound, guarding or rigidity. Right lateral leg with large area of erythema and weeping lesions Left rib cage pain, reproducible with palpation Right lateral leg  Right anterior leg  Left lateral leg      I have personally reviewed the following:   Data Reviewed:  CBC Recent Labs  Lab 03/18/20 0354 03/19/20 0151 03/20/20 0248 03/21/20 0449 03/22/20 0954  WBC 23.0* 21.1* 18.1* 12.3* 12.5*  HGB 12.0 11.2* 11.1* 11.3* 11.3*  HCT 39.3 36.8 36.5 37.3 37.7  PLT 290 257 229 235 247  MCV 88.5 86.8 86.9 88.6 88.1  MCH 27.0 26.4 26.4 26.8 26.4  MCHC 30.5 30.4 30.4 30.3 30.0  RDW 22.7* 22.5* 22.9* 23.0* 23.2*  LYMPHSABS 0.5* 0.5* 0.5* 0.6*  --   MONOABS 0.7 0.8 0.8 0.7  --   EOSABS 0.0 0.2 0.3 0.7*  --   BASOSABS 0.1 0.0 0.0 0.1  --     Chemistries  Recent Labs  Lab 03/06/2020 2206 03/30/2020 2225  03/18/20 0354 03/19/20 0151 03/20/20 0248 03/21/20 0449 03/22/20 0954  NA  --    < > 130* 132* 133* 132* 131*  K  --    < > 5.1 5.4* 4.7 4.4 5.1  CL  --    < > 98 100 101 101 99  CO2  --    < > 18* 16* 23 21* 20*  GLUCOSE  --    < > 139* 67* 138* 118* 99  BUN  --    < > 56* 53* 52* 52* 50*  CREATININE  --    < > 2.08* 1.90* 1.69* 1.67* 1.52*  CALCIUM  --    < > 6.1* 6.2* 6.5* 6.5* 6.6*  MG  --   --  1.9 2.5*  --   --   --   AST 38  --  24  --   --   --   --   ALT 22  --  24  --   --   --   --   ALKPHOS 152*  --  154*  --   --   --   --   BILITOT 2.7*  --  2.0*  --   --   --   --    < > = values in this interval not displayed.   ------------------------------------------------------------------------------------------------------------------ No results for input(s): CHOL, HDL, LDLCALC, TRIG, CHOLHDL, LDLDIRECT in the last 72 hours.  Lab Results  Component Value Date   HGBA1C 7.2 (H) 03/18/2020   ------------------------------------------------------------------------------------------------------------------ No results for input(s): TSH, T4TOTAL, T3FREE, THYROIDAB in the last 72 hours.  Invalid input(s): FREET3 ------------------------------------------------------------------------------------------------------------------ No results for input(s): VITAMINB12, FOLATE, FERRITIN, TIBC, IRON, RETICCTPCT in the last 72 hours.  Coagulation profile Recent Labs  Lab 03/18/20 0354  INR 2.5*    No results for input(s): DDIMER in the last 72 hours.  Cardiac Enzymes No results for input(s): CKMB, TROPONINI, MYOGLOBIN in the last 168 hours.  Invalid input(s): CK ------------------------------------------------------------------------------------------------------------------    Component Value Date/Time   BNP 912.0 (H) 03/18/2020 0229   BNP 398.6 (H) 09/13/2015  [place] Results Recent Results (from the past 240 hour(s))  Culture, blood (routine x 2)     Status: None  (Preliminary result)   Collection Time: 03/18/20  2:25 AM   Specimen: BLOOD  Result Value Ref Range Status   Specimen Description BLOOD RIGHT HAND  Final   Special Requests   Final    BOTTLES DRAWN AEROBIC AND ANAEROBIC Blood Culture results may not be optimal due to an excessive volume of blood received in culture bottles   Culture   Final    NO GROWTH 4 DAYS Performed at [facility], [place] [address] [place], [place] [address]    Report Status PENDING  Incomplete  SARS Coronavirus 2 by RT PCR (hospital order, performed in [facility] hospital lab) Nasopharyngeal Nasopharyngeal Swab     Status: None   Collection Time: 03/18/20  2:27 AM   Specimen: Nasopharyngeal Swab  Result Value Ref Range Status   SARS Coronavirus 2 NEGATIVE NEGATIVE Final    Comment: (NOTE) SARS-CoV-2 target nucleic acids are NOT DETECTED.  The SARS-CoV-2 RNA is generally detectable in upper and lower respiratory specimens during the acute phase of infection. The lowest concentration of SARS-CoV-2 viral copies this assay can detect is 250 copies / mL. A negative result does not preclude SARS-CoV-2 infection and should not be used as the sole basis for treatment or other patient management decisions.  A negative result may occur with improper specimen collection / handling, submission of specimen other than nasopharyngeal swab, presence of viral mutation(s) within the areas targeted by this assay, and inadequate number of viral copies (<250 copies / mL). A negative result must be combined with clinical observations, patient history, and epidemiological information.  Fact Sheet for Patients:   StrictlyIdeas.no  Fact Sheet for Healthcare Providers: BankingDealers.co.za  This test is not yet approved or  cleared by the [place] FDA and has been authorized for detection and/or diagnosis of SARS-CoV-2 by FDA under an Emergency Use Authorization (EUA).   This EUA will remain in effect (meaning this test can be used) for the duration of the COVID-19 declaration under Section 564(b)(1) of the Act, 21 U.S.C. section 360bbb-3(b)(1), unless the authorization is terminated or revoked sooner.  Performed at [facility], [place] [address], [place], [place] [address]   Culture, blood (routine x 2)     Status: None (Preliminary result)   Collection Time: 03/18/20  2:29 AM   Specimen: BLOOD  Result Value Ref Range Status   Specimen Description BLOOD RIGHT ARM  Final   Special Requests   Final    BOTTLES DRAWN AEROBIC ONLY Blood Culture adequate volume   Culture   Final    NO GROWTH 4 DAYS Performed at [facility], [place] [address], [place], [place] [address]    Report Status PENDING  Incomplete  MRSA PCR Screening     Status: None   Collection Time: 03/18/20  6:13 AM   Specimen: Nasopharyngeal  Result Value Ref Range Status   MRSA by PCR NEGATIVE NEGATIVE Final    Comment:        The GeneXpert MRSA Assay (FDA approved for NASAL specimens only), is one component of a comprehensive MRSA colonization surveillance program. It is not intended to diagnose MRSA infection nor to guide or monitor treatment for MRSA infections. Performed at [facility], [place] [address] [place], [place] [address]     Radiology Reports DG Chest 2 View  Result Date: 03/31/2020 CLINICAL DATA:  Chest pain EXAM: CHEST - 2 VIEW COMPARISON:  12/04/2019, PET CT 12/22/2019 FINDINGS: Post sternotomy changes with left atrial appendage clip. Left-sided pacing device as before. Cardiomegaly with mildly enlarged central pulmonary vessels. No overt edema. No consolidation, pleural effusion, or pneumothorax. Moderate compression deformity of mid to lower thoracic vertebra, new or increased as compared to chest CT from May 2021. IMPRESSION: Cardiomegaly without overt failure. New mild to moderate compression deformity of mid to lower thoracic vertebra.  Electronically Signed   By: Donavan Foil M.D.   On: 03/11/2020 20:18   CT CHEST WO CONTRAST  Addendum Date: 03/22/2020   ADDENDUM REPORT: 03/22/2020 09:53 ADDENDUM: Spine fractures with 2 column involvement at T5 and T8 were discussed along with worsening spinal alignment and paraspinal soft tissue along the upper thoracic spine were discussed with the provider is outlined below. MRI could be considered if cardiac device will allow. These results were called by telephone at the time of interpretation on 03/22/2020 at 9:53 am to provider [facility] , who verbally acknowledged these results. Electronically Signed   By: Zetta Bills M.D.   On: 03/22/2020 09:53   Result Date: 03/22/2020 CLINICAL DATA:  Breast cancer, surveillance of metastatic disease, significant pain reported to LEFT lateral chest. EXAM: CT CHEST WITHOUT CONTRAST TECHNIQUE: Multidetector CT imaging of the chest was performed following the standard protocol without IV contrast. COMPARISON:  Dec 04, 2019 and PET evaluation of Dec 22, 2019 FINDINGS: Cardiovascular: Calcified atheromatous plaque of the thoracic aorta. No aneurysmal dilation. Post median sternotomy for CABG with unchanged cardiac enlargement. Mitral annular calcification and cardiac pacer defibrillator and LEFT atrial clipping as before. Engorgement of central pulmonary vasculature with similar appearance. Limited assessment of vascular structures in the chest due to lack of intravenous contrast. Mediastinum/Nodes: No adenopathy at the thoracic inlet. No adenopathy in the axilla. No mediastinal lymphadenopathy. Esophagus mildly patulous. Lungs/Pleura: No pleural effusion. Interstitial thickening at the lung apices is persistent and somewhat irregular. For instance on image 29 of series 4 there is more profound interstitial thickening with a bandlike appearance changes are seen in the medial chest with similar appearance. In the RIGHT upper lobe a bandlike area extends from the  hilum into the RIGHT upper lobe. Some scarring along the RIGHT heart border with mild bronchial dilation with slight increase since the previous study. No discrete mass. Upper Abdomen: Incidental imaging of upper abdominal contents show no acute process to the extent evaluated. Adrenal glands are normal. Musculoskeletal: Spinous process fracture incidentally noted at C7, incompletely imaged. Slight interval decrease in height of the T1 vertebral body anteriorly compatible with mild compression approximately 10%. This was likely present previously. Interval development of pathologic fractures of the thoracic spine since previous imaging with worsening soft tissue about the upper thoracic spine at the level of T2, T3 and T4 with pathologic fractures at all these levels. T2 with minimal loss of height and T3 and T4 with approximately 20% loss of height. Also with approximately 50% loss of height at the T5 level worse on the LEFT with resultant dextroconvexity of the spine at this location that was mild previously and is now moderate in terms of curvature. The compression fractures present at T4-T5 on the prior study have worsened in the interval. Interval approximately 40-50% loss of height of the T8 vertebral body. Fracture at this level and at the T5 level displaying signs of posterior cortical involvement of the vertebral body. Only very minimal retropulsion  is noted at these levels. Interval 20% loss of height of the L1 vertebral body since the prior study. Diffuse heterogeneity of visualized bones in this patient with known widespread bony metastases. LEFT breast mass may be slightly smaller than on the previous study measuring approximately 3.6 x 1.3 cm. Greatest thickness on the prior study was approximately 2.4 cm. IMPRESSION: 1. Worsening of pre-existing pathologic fractures in the thoracic spine with new T8 compression and new L1 compression as described. There is mild posterior cortical retropulsion at the  T5 and T8 levels. 2. Development of soft tissue along lateral margin of the spine likely related to worsening malignancy. MRI of the spine could be helpful as clinically warranted for further assessment. 3. Septal thickening at the lung apices could be a chronic finding but could also be seen in the setting of lymphangitic carcinomatosis. Attention on follow-up. 4. Breast mass may be slightly smaller than on the previous study. 5. Similar appearance of central pulmonary arterial enlargement likely related to underlying pulmonary arterial hypertension. 6. Signs of cardiac disease as before and changes of median sternotomy for CABG. A call is out to the referring provider to further discuss findings in the above case. Aortic Atherosclerosis (ICD10-I70.0). Electronically Signed: By: Zetta Bills M.D. On: 03/22/2020 09:42   VAS [place] LOWER EXTREMITY VENOUS (DVT)  Result Date: 03/20/2020  Lower Venous DVTStudy Indications: Edema.  Risk Factors: Cancer Breast. Limitations: Bandages and body habitus. Comparison Study: Performing Technologist: Antonieta Pert RDMS, RVT  Examination Guidelines: A complete evaluation includes B-mode imaging, spectral Doppler, color Doppler, and power Doppler as needed of all accessible portions of each vessel. Bilateral testing is considered an integral part of a complete examination. Limited examinations for reoccurring indications may be performed as noted. The reflux portion of the exam is performed with the patient in reverse Trendelenburg.  +---------+---------------+---------+-----------+----------+--------------+ RIGHT    CompressibilityPhasicitySpontaneityPropertiesThrombus Aging +---------+---------------+---------+-----------+----------+--------------+ CFV      Full           Yes      Yes                                 +---------+---------------+---------+-----------+----------+--------------+ SFJ      Full                                                         +---------+---------------+---------+-----------+----------+--------------+ FV Prox  Full                                                        +---------+---------------+---------+-----------+----------+--------------+ FV Mid   Full                                                        +---------+---------------+---------+-----------+----------+--------------+ FV DistalFull                                                        +---------+---------------+---------+-----------+----------+--------------+  PFV      Full                                                        +---------+---------------+---------+-----------+----------+--------------+ POP      Full           Yes      Yes                                 +---------+---------------+---------+-----------+----------+--------------+ PTV                                                   Not visualized +---------+---------------+---------+-----------+----------+--------------+ PERO                                                  Not visualized +---------+---------------+---------+-----------+----------+--------------+ GSV      Full                                                        +---------+---------------+---------+-----------+----------+--------------+   +---------+---------------+---------+-----------+----------+--------------+ LEFT     CompressibilityPhasicitySpontaneityPropertiesThrombus Aging +---------+---------------+---------+-----------+----------+--------------+ CFV      Full                                                        +---------+---------------+---------+-----------+----------+--------------+ SFJ      Full                                                        +---------+---------------+---------+-----------+----------+--------------+ FV Prox  Full                                                         +---------+---------------+---------+-----------+----------+--------------+ FV Mid   Full                                                        +---------+---------------+---------+-----------+----------+--------------+ FV DistalFull                                                        +---------+---------------+---------+-----------+----------+--------------+  PFV      Full                                                        +---------+---------------+---------+-----------+----------+--------------+ POP      Full                                                        +---------+---------------+---------+-----------+----------+--------------+ PTV                                                   Not visualized +---------+---------------+---------+-----------+----------+--------------+ PERO                                                  Not visualized +---------+---------------+---------+-----------+----------+--------------+ GSV      Full                                                        +---------+---------------+---------+-----------+----------+--------------+ hypoechoic fluid collection posterior left popliteal fossa 3.9cm    Summary: RIGHT: - There is no evidence of deep vein thrombosis in the lower extremity. However, portions of this examination were limited- see technologist comments above.  - No cystic structure found in the popliteal fossa. - calf veins not visulaized due to bandages  LEFT: - There is no evidence of deep vein thrombosis in the lower extremity. However, portions of this examination were limited- see technologist comments above.  - A cystic structure is found in the popliteal fossa. - calf veins not visulaized due to bandages  *See table(s) above for measurements and observations. Electronically signed by Deitra Mayo MD on 03/20/2020 at 5:10:34 PM.    Final      Time Spent in minutes  30     Desiree Hane M.D on  03/22/2020 at 1:51 PM  To page go to [email or web address] - password [facility]

## 2020-03-22 NOTE — Plan of Care (Signed)
Poc progressing.  

## 2020-03-22 NOTE — Progress Notes (Signed)
[name] is having hard time this morning.  She did have a lot of pain in the left lateral rib cage.  This is just below the left breast.  This seemed to start last night.  She was doing a whole lot better yesterday.  She is not having any pain.  I am not sure as to why she is having this now.  I probably would not be a bad idea to get a CT of the chest, without contrast, to see if there is anything with the ribs had my be from her cancer.  Her last CA 27.29 was holding relatively stable at 1100.  She is due today for her next treatment of immunotherapy for the metastatic breast cancer.  We will have to hold on this while she is in the hospital.  Atrial fibrillation is still not well controlled.  She still has a heart rate of around 125.  Her leg is still quite swollen. She has a infection on top of her right foot.  There is an area of eschar with exudate.  She did not have much of an appetite yesterday.  Her labs not yet back today.  I have that she has a very low tolerance for pain.  I really would not change pain medication on her right now.  I probably would start her on some Toradol.  I think she has gotten this before and I think probably work.  There may be an inflammatory component to the pain.  We will have to see what the CT scan shows.  I appreciate the great care that she is getting from all the staff on 4 E.  Lattie Haw, MD  [name] 3:17

## 2020-03-22 NOTE — Progress Notes (Signed)
Mobility Specialist - Progress Note   03/22/20 1544  Mobility  Activity Refused mobility   Pt refused mobility, stating she feels "weak all over". She refused sitting up on the edge of her bed as well saying that she had just done this before I came in.    Pricilla Handler Mobility Specialist Mobility Specialist Phone: [phone]

## 2020-03-23 LAB — GLUCOSE, CAPILLARY
Glucose-Capillary: 80 mg/dL (ref 70–99)
Glucose-Capillary: 107 mg/dL — ABNORMAL HIGH (ref 70–99)
Glucose-Capillary: 132 mg/dL — ABNORMAL HIGH (ref 70–99)
Glucose-Capillary: 84 mg/dL (ref 70–99)

## 2020-03-23 LAB — BASIC METABOLIC PANEL
Anion gap: 12 (ref 5–15)
Anion gap: 14 (ref 5–15)
BUN: 52 mg/dL — ABNORMAL HIGH (ref 8–23)
BUN: 53 mg/dL — ABNORMAL HIGH (ref 8–23)
CO2: 17 mmol/L — ABNORMAL LOW (ref 22–32)
CO2: 20 mmol/L — ABNORMAL LOW (ref 22–32)
Calcium: 6.3 mg/dL — CL (ref 8.9–10.3)
Calcium: 6.6 mg/dL — ABNORMAL LOW (ref 8.9–10.3)
Chloride: 99 mmol/L (ref 98–111)
Chloride: 98 mmol/L (ref 98–111)
Creatinine, Ser: 1.72 mg/dL — ABNORMAL HIGH (ref 0.44–1.00)
Creatinine, Ser: 1.8 mg/dL — ABNORMAL HIGH (ref 0.44–1.00)
GFR calc Af Amer: 34 mL/min — ABNORMAL LOW (ref >60)
GFR calc Af Amer: 32 mL/min — ABNORMAL LOW (ref >60)
GFR calc non Af Amer: 29 mL/min — ABNORMAL LOW (ref >60)
GFR calc non Af Amer: 28 mL/min — ABNORMAL LOW (ref >60)
Glucose, Bld: 87 mg/dL (ref 70–99)
Glucose, Bld: 110 mg/dL — ABNORMAL HIGH (ref 70–99)
Potassium: 6.6 mmol/L (ref 3.5–5.1)
Potassium: 5.3 mmol/L — ABNORMAL HIGH (ref 3.5–5.1)
Sodium: 128 mmol/L — ABNORMAL LOW (ref 135–145)
Sodium: 132 mmol/L — ABNORMAL LOW (ref 135–145)

## 2020-03-23 LAB — HEPATIC FUNCTION PANEL
ALT: 23 U/L (ref 0–44)
AST: 36 U/L (ref 15–41)
Albumin: 2.8 g/dL — ABNORMAL LOW (ref 3.5–5.0)
Alkaline Phosphatase: 126 U/L (ref 38–126)
Bilirubin, Direct: 0.7 mg/dL — ABNORMAL HIGH (ref 0.0–0.2)
Indirect Bilirubin: 1.4 mg/dL — ABNORMAL HIGH (ref 0.3–0.9)
Total Bilirubin: 2.1 mg/dL — ABNORMAL HIGH (ref 0.3–1.2)
Total Protein: 6.6 g/dL (ref 6.5–8.1)

## 2020-03-23 LAB — CULTURE, BLOOD (ROUTINE X 2)
Culture: NO GROWTH
Culture: NO GROWTH
Special Requests: ADEQUATE

## 2020-03-23 LAB — POTASSIUM
Potassium: 5.5 mmol/L — ABNORMAL HIGH (ref 3.5–5.1)
Potassium: 5.2 mmol/L — ABNORMAL HIGH (ref 3.5–5.1)
Potassium: 5.6 mmol/L — ABNORMAL HIGH (ref 3.5–5.1)

## 2020-03-23 MED ORDER — SODIUM ZIRCONIUM CYCLOSILICATE 5 G PO PACK
5.0000 g | PACK | ORAL | Status: AC
  Administered 2020-03-23: 5 g via ORAL
  Filled 2020-03-23: qty 1

## 2020-03-23 MED ORDER — FULVESTRANT 250 MG/5ML IM SOLN
500.0000 mg | Freq: Once | INTRAMUSCULAR | Status: AC
  Administered 2020-03-23: 500 mg via INTRAMUSCULAR
  Filled 2020-03-23: qty 10

## 2020-03-23 MED ORDER — SODIUM ZIRCONIUM CYCLOSILICATE 10 G PO PACK
10.0000 g | PACK | Freq: Once | ORAL | Status: AC
  Administered 2020-03-23: 10 g via ORAL
  Filled 2020-03-23: qty 1

## 2020-03-23 MED ORDER — KETOROLAC TROMETHAMINE 15 MG/ML IJ SOLN
30.0000 mg | Freq: Three times a day (TID) | INTRAMUSCULAR | Status: AC
  Administered 2020-03-23 (×3): 30 mg via INTRAVENOUS
  Filled 2020-03-23 (×3): qty 2

## 2020-03-23 MED ORDER — SALINE SPRAY 0.65 % NA SOLN
1.0000 | NASAL | Status: DC | PRN
  Filled 2020-03-23: qty 44

## 2020-03-23 MED ORDER — EXEMESTANE 25 MG PO TABS
25.0000 mg | ORAL_TABLET | Freq: Every day | ORAL | Status: DC
  Filled 2020-03-23: qty 1

## 2020-03-23 MED ORDER — SODIUM ZIRCONIUM CYCLOSILICATE 5 G PO PACK: 5.0000 g | PACK | Freq: Once | ORAL | Status: DC

## 2020-03-23 MED ORDER — EXEMESTANE 25 MG PO TABS
25.0000 mg | ORAL_TABLET | Freq: Every day | ORAL | Status: DC
  Administered 2020-03-23 – 2020-04-06 (×15): 25 mg via ORAL
  Filled 2020-03-23 (×15): qty 1

## 2020-03-23 NOTE — Progress Notes (Signed)
CRITICAL VALUE ALERT  Critical Value:  K+: 6.6, Ca+2:6.3  Date & Time Notied:  03/23/20 @0500   Provider Notified: Dr. Tonie Griffith  Orders Received/Actions taken: [facility] ordered and given, additional labs to assess Ca+2 ordered.  Will continue to monitor.

## 2020-03-23 NOTE — TOC Initial Note (Signed)
Transition of Care (TOC) - Initial/Assessment Note  Marvetta Gibbons RN, BSN Transitions of Care Unit 4E- RN Case Manager See Treatment Team for direct phone #    Patient Details  Name: [name] MRN: [id number] Date of Birth: 10/27/1945  Transition of Care [facility]) CM/SW Contact:    Dawayne Patricia, RN Phone Number: 03/23/2020, 4:50 PM  Clinical Narrative:                 From home with children. Referral received for outpt PC needs- CM spoke with pt at bedside- pt at first not sure she wanted f/u however then asked about agencies and was agreeable but did not have a preference- explained that I would call [facility] and have them call her to explain more- she was agreeable to this.  Also discussed therapies feelings that she may benefit from [facility] services- pt states she does not want this as she doesn't want someone "telling her what to do and when" explained she would be more in control of [facility] services as she could decide what days and timing somewhat of when they came- explained she could think about this and decide if she wanted it. She also asked about a hospital bed- discussed this- she states she plans to f/u with Dr. Marin Olp.   Call made to Audrea Muscat with [facility] for Home PC outpt needs- she will f/u on referral and have someone call pt at bedside to talk about outpt PC services.   Expected Discharge Plan: [place] Barriers to Discharge: Continued Medical Work up   Patient Goals and CMS Choice Patient states their goals for this hospitalization and ongoing recovery are:: return home      Expected Discharge Plan and Services Expected Discharge Plan: [place] In-house Referral: Hospice / Palliative Care Discharge Planning Services: CM Consult   Living arrangements for the past 2 months: Single Family Home                                      Prior Living Arrangements/Services Living arrangements for the past 2  months: Single Family Home   Patient language and need for interpreter reviewed:: Yes        Need for Family Participation in Patient Care: Yes (Comment) Care giver support system in place?: Yes (comment)   Criminal Activity/Legal Involvement Pertinent to Current Situation/Hospitalization: No - Comment as needed  Activities of Daily Living      Permission Sought/Granted                  Emotional Assessment Appearance:: Appears stated age Attitude/Demeanor/Rapport: Engaged Affect (typically observed): Appropriate Orientation: : Oriented to Self, Oriented to Place, Oriented to  Time, Oriented to Situation Alcohol / Substance Use: Not Applicable Psych Involvement: No (comment)  Admission diagnosis:  Sepsis due to cellulitis ([place]) [L03.90, A41.9] Patient Active Problem List   Diagnosis Date Noted  . 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 Advanced care planning/counseling discussion   . Palliative care by specialist   . Reactive depression   . Sepsis due to cellulitis ([place]) 03/18/2020  . Hypocalcemia 03/18/2020  . Cellulitis of left lower extremity 03/18/2020  . Hyperkalemia 03/18/2020  . Dehydration with hyponatremia 03/18/2020  . Lactic acidosis 03/18/2020  . Family history of breast cancer   . Lynch syndrome   . Genetic testing  01/09/2020  . Breast cancer metastasized to bone ([place]) 12/06/2019  . Goals of care, counseling/discussion 12/06/2019  . HTN (hypertension)   . Chest pain   . Pressure injury of skin 12/05/2016  . 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 NSTEMI (non-ST elevated myocardial infarction) ([place]) 11/27/2016  . Ventricular fibrillation ([place]) 11/27/2016  . Head injury   . ICD (implantable cardioverter-defibrillator) discharge   . Diabetic neuropathy ([place]) 06/16/2016  . CKD (chronic kidney disease) stage 3, GFR 30-59 ml/min 06/16/2016  . Fatigue 10/30/2015  . Paroxysmal atrial fibrillation with rapid ventricular  response ([place]) 10/02/2015  . Acute on chronic systolic CHF (congestive heart failure) ([place]) 10/02/2015  . Atrial flutter ([place]) 09/18/2015  . Chronic anticoagulation-Xarelto 09/18/2015  . Acute on chronic congestive heart failure ([place]) 09/18/2015  . Orthostatic hypotension   . ICD in place- St Jude   . HLD (hyperlipidemia) 05/11/2015  . Syncope 05/11/2015  . Abdominal pain 05/11/2015  . Acute kidney injury ([place])   . Nausea & vomiting   . Hypothyroidism 04/27/2015  . Chronic diastolic CHF (congestive heart failure) ([place])   . Abnormal PFT   . CAD (coronary artery disease) 04/21/2015  . Acute respiratory failure ([place])   . Ventricular tachycardia- Sept 2016- ICD 04/13/2015  . UTI (lower urinary tract infection) 03/31/2015  . Sepsis ([place]) 03/31/2015  . Atrial fibrillation with RVR ([place]) 03/31/2015  . Systolic CHF ([place])   . Hypertrophic cardiomyopathy ([place])   . Septic bursitis of elbow 02/03/2013  . Acute blood loss anemia 01/08/2013    Class: Acute  . Diabetes mellitus type 2, noninsulin dependent ([place])   . Cellulitis 12/31/2012  . Fasting hyperglycemia 12/31/2012  . Leukocytosis 12/31/2012  . Hypotension 12/31/2012   PCP:  Patient, No Pcp Per Pharmacy:   [facility] [address] [place] [address] Phone: [phone] Fax: [phone]  [place], [place] [address] [place] [address] Phone: [phone] Fax: [phone]     Social Determinants of Health (SDOH) Interventions    Readmission Risk Interventions Readmission Risk Prevention Plan 12/08/2019  Transportation Screening Complete  HRI or Home Care Consult Complete  Social Work Consult for [place] Planning/Counseling Complete  Palliative Care Screening Complete  Medication Review Press photographer) Complete  Some recent data might be hidden

## 2020-03-23 NOTE — Progress Notes (Addendum)
Advanced Heart Failure Rounding Note  PCP-Cardiologist: No primary care provider on file.     Patient Profile   74 y/o woman with recently diagnosed metastatic breast CA (undergoing XRT), CAD s/p CABG, DM2, systolic HF with EF 13-08% and PAF.   Admitted with sepsis and lactic acidosis due to severe LE cellulitis in setting of marked volume overload. Course complicated by AF with RVR.   Subjective:     K 6.6 on AM lab draw. Received dose of Lokelma. Repeat BMP pending. Persistent afib, rates low 100s.   SCr trending back up, bump from 1.52>>1.72 overnight. SBPs 110s. Only 600 cc in UOP charted yesterday but pt reports issues w/ incontinence. Got 1 x dose of 80 IV Lasix yesterday. Wt up +2lb.   Continues w/ significant fluid overload but no dyspnea. Weeping LEE continues.  LFTs ok.  Albumin low at 2.8.   Overall feels much better today. Pain improved w/ Toradol. No nausea today.    Objective:   Weight Range: 73.5 kg Body mass index is 27.81 kg/m.   Vital Signs:   Temp:  [97.4 F (36.3 C)-98.7 F (37.1 C)] 98.7 F (37.1 C) (08/20 0806) Pulse Rate:  [82-112] 106 (08/20 0806) Resp:  [14-20] 18 (08/20 0806) BP: (105-114)/(63-77) 114/63 (08/20 0806) SpO2:  [94 %-98 %] 98 % (08/20 0806) Weight:  [73.5 kg] 73.5 kg (08/20 0500) Last BM Date: 03/22/20  Weight change: Filed Weights   03/21/20 0557 03/22/20 0500 03/23/20 0500  Weight: 76.6 kg 72.4 kg 73.5 kg    Intake/Output:   Intake/Output Summary (Last 24 hours) at 03/23/2020 1025 Last data filed at 03/23/2020 0300 Gross per 24 hour  Intake 786.41 ml  Output 600 ml  Net 186.41 ml      Physical Exam    General: chronically ill appearing, sitting up on side of bed, no resp difficulty HEENT: normal Neck: supple. JVP elevated to jaw . Carotids 2+ bilat; no bruits. No lymphadenopathy or thryomegaly appreciated. Cor: PMI nondisplaced. Irregularly irregular rate & rhythm, tachy. No rubs, gallops or  murmurs. Lungs: clear Abdomen: soft, nontender, nondistended. No hepatosplenomegaly. No bruits or masses. Good bowel sounds. Extremities: no cyanosis, clubbing, rash, 2+ bilateral LEE up to knees both legs wrapped (wheeping edema) Neuro: alert & orientedx3, cranial nerves grossly intact. moves all 4 extremities w/o difficulty. Affect pleasant   Telemetry   A fib 110s   EKG    No new EKG to review   Labs    CBC Recent Labs    03/21/20 0449 03/22/20 0954  WBC 12.3* 12.5*  NEUTROABS 10.1*  --   HGB 11.3* 11.3*  HCT 37.3 37.7  MCV 88.6 88.1  PLT 235 657   Basic Metabolic Panel Recent Labs    03/22/20 0954 03/23/20 0245  NA 131* 128*  K 5.1 6.6*  CL 99 99  CO2 20* 17*  GLUCOSE 99 87  BUN 50* 52*  CREATININE 1.52* 1.72*  CALCIUM 6.6* 6.3*   Liver Function Tests Recent Labs    03/23/20 0245  AST 36  ALT 23  ALKPHOS 126  BILITOT 2.1*  PROT 6.6  ALBUMIN 2.8*   No results for input(s): LIPASE, AMYLASE in the last 72 hours. Cardiac Enzymes No results for input(s): CKTOTAL, CKMB, CKMBINDEX, TROPONINI in the last 72 hours.  BNP: BNP (last 3 results) Recent Labs    12/04/19 1324 03/18/20 0229  BNP 581.1* 912.0*    ProBNP (last 3 results) No results for input(s): PROBNP  in the last 8760 hours.   D-Dimer No results for input(s): DDIMER in the last 72 hours. Hemoglobin A1C No results for input(s): HGBA1C in the last 72 hours. Fasting Lipid Panel No results for input(s): CHOL, HDL, LDLCALC, TRIG, CHOLHDL, LDLDIRECT in the last 72 hours. Thyroid Function Tests No results for input(s): TSH, T4TOTAL, T3FREE, THYROIDAB in the last 72 hours.  Invalid input(s): FREET3  Other results:   Imaging    No results found.   Medications:     Scheduled Medications: . amiodarone  200 mg Oral Daily  . apixaban  5 mg Oral BID  . exemestane  25 mg Oral QPC breakfast  . fulvestrant  500 mg Intramuscular Once  . furosemide  80 mg Intravenous Daily  .  insulin aspart  0-15 Units Subcutaneous TID AC & HS  . ketorolac  30 mg Intravenous Q8H  . mirtazapine  7.5 mg Oral QHS  . morphine  15 mg Oral Q12H  . senna  2 tablet Oral BID    Infusions: . sodium chloride Stopped (03/22/20 1658)  . cefTRIAXone (ROCEPHIN)  IV Stopped (03/22/20 1424)  . ondansetron (ZOFRAN) IV      PRN Medications: sodium chloride, acetaminophen **OR** acetaminophen, HYDROmorphone (DILAUDID) injection, morphine, ondansetron **OR** ondansetron (ZOFRAN) IV, polyethylene glycol, promethazine   Assessment/Plan    1. Atrial Fibrillation with RVR - S/p MAZE and LAA clipping at time of CABG in 11/2016. S/p DCCV in 09/2015 and more recently 12/09/2019. Noted to be back in atrial fibrillation at last office visit on 01/30/2020 and started on low dose PO Amiodarone. - Presented in atrial fibrillation with RVR in the setting of sepsis secondary to cellulitis of bilateral lower extremities. Difficult situation complicated by patient's intolerance to multiple cardiac meds. V-tates currently in the 110s- 120s. - Not been able to tolerate IV Amiodarone due to severe nausea. - Not able to tolerate beta-blocker due to hypotension with syncope. - Calcium channel blockers not a good option with reduced EF. - Digoxin not a good option with current AKI.  - continue to treat w/ low dose PO amio, 200 mg daily + treatment of acute infection  - Continue Eliquis 5mg  twice daily.    2. Acute on Chronic Combined CHF - History of ischemic cardiomyopathy. PYP in the past negative for amyloid. - Most recent Echo in 12/2019 showed LVEF of 35-40% with severe asymmetric LVH, mild MR, moderate TR, and severely elevated PASP. - Chest x-ray showed cardiomegaly with mildly enlarged central pulmonary vessel but no overt edema. BNP 912 - Home Lasix held on admit and patient actually given some IV fluids on admission due to sepsis/ AKI. Diuretics restarted 8/17. Remains volume overloaded.  - Continue Lasix  80 mg IV once daily  - intolerant to Losartan due to hyperkalemia    3. CAD  - S/p CABG x4 in 11/2016.  - High-sensitivity troponin minimally elevated and flat at 22 >> 24. Not consistent with ACS. Suspect demand ischemia in setting of sepsis and atrial fibrillation with RVR. - She reports constant left sided flank/breast pain that is worse with inspiration for the last several months. Sounds like this may be secondary to radiation. Nothing that sounds like angina. - No Aspirin with need for Eliquis.  - Continue statin.  4. 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 Had VF arrest in 11/2016 with appropriate ICD shock.  - No recurrent VT/VF.  - Had not been able to tolerate Amiodarone or beta-blocker in the  past.  5. Sepsis Secondary to Bilateral Lower Extremity Cellulitis - initially placed on broad spectrum abx w/ vanc + meropenum. AF. Lactic acid normalized.  Blood cultures NGTD  - abx have been narrowed to Rocephin   6. 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 - Diagnosed in 12/2019. Lynch syndrome diagnosed by genetic study.  - Follows with Dr. Marin Olp as outpatient. On immunotherapy   7. Hyperkalemia - K 6.6 this am, treated w/ Lokelma + IV Lasix  - repeat BMP pending   8. Type 2 Diabetes Mellitus - Management per primary team.  9. Acute on Chronic Kidney Disease - baseline SCr 1.0-1.30 range  - Creatinine 2.20 on admission, sepsis and deceased PO intake with constant nausea and frequent vomiting -overall improved but trending back up, 1.52>>1.72 - continue to monitor w/ diuresis - monitor w/ Toradol   Length of Stay: [address] PA-C  03/23/2020, 10:25 AM    Advanced Heart Failure Team Pager [phone] (M-F; 7a - 4p)  Please contact [facility] for night-coverage after hours (4p -7a ) and weekends on [email or web address]  Patient seen and examined with the above-signed Advanced Practice Provider and/or Housestaff. I personally reviewed  laboratory data, imaging studies and relevant notes. I independently examined the patient and formulated the important aspects of the plan. I have edited the note to reflect any of my changes or salient points. I have personally discussed the plan with the patient and/or family.  She is still edematous but K and creatinine both up. Remains in AF with RVR rates 100-115 on tele (personally reviewed). Still with pain.   General:  Weak appearing. No resp difficulty HEENT: normal Neck: supple. JVP 8-98 Carotids 2+ bilat; no bruits. No lymphadenopathy or thryomegaly appreciated. Cor: PMI nondisplaced. Irregular tachy. No rubs, gallops or murmurs. Lungs: clear Abdomen: soft, nontender, nondistended. No hepatosplenomegaly. No bruits or masses. Good bowel sounds. Extremities: no cyanosis, clubbing, rash, 2+ edema wrapped with gauze Neuro: alert & orientedx3, cranial nerves grossly intact. moves all 4 extremities w/o difficulty. Affect pleasant  Will hold IV lasix today and watch kidney function. Continue po amiodarone and eliquis. Would avoid NSAIDs if at all possible given CKD.   Glori Bickers, MD  5:42 PM

## 2020-03-23 NOTE — Progress Notes (Signed)
[facility]  PROGRESS NOTE  [name] XFG:[id number] DOB: February 19, 1946 DOA: 03/27/2020 PCP: Patient, No Pcp Per Admit date - 03/21/2020   Admitting Physician Vernelle Emerald, MD  Outpatient Primary MD for the patient is Patient, No Pcp Per  LOS - 5 Brief Narrative   JELINA PAULSEN is a 74 y.o. 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 fib status post ICD, breast cancer with bone mets/Lynch syndrome who presented on 8/14 with several days of shortness of breath, worsening redness of lower legs and weeping lesions for the past 2 weeks in setting of using Derm stone to scrape her dry skin and found to have A. fib with RVR to the 120s and sepsis physiology (white count of 24.5, lactic acid 2.2, Respiratory rate of 21) secondary to bilateral lower extremity cellulitis course complicated by A. fib with RVR, AKI on CKD with hyperkalemia    Subjective  Feels rib cage pain is better.  Seems like her appetite is improving.  Denies any shortness of breath.  A & P  Sepsis secondary to nonpurulent cellulitis of bilateral lower extremities.  Unfortunate consequence of exfoliativestone use to skin.  Remains afebrile, white count downtrending, blood cultures unremarkable, sepsis physiology resolved -Continue IV ceftriaxone day 6 of 7, anticipate transitioning to oral for coverage of MSSA (likely culprit)  A. fib with RVR, likely exacerbated in setting of sepsis due to above, persists.  Heart rate remains in the 110s to 120s .  Patient's been unable to tolerate IV amiodarone due to nausea in the past, CCB is not an option with decreased EF, hypotension to prior beta-blockade (patient declines IV amiodarone or higher p.o. dosing) -Appreciate cardiology recommendations, continue low-dose p.o. amiodarone 200 mg daily -Hopeful continue  treatment of infection will improve heart rates  Acute on chronic, systolic/diastolic CHF.  Still quite volume overloaded on exam, some slight improvement in bilateral lower extremity swelling with 2-3+ pitting edema bilateral lower extremities.  In the setting of sepsis on admission and holding home diuretics.  BNP in the 900s on admission -Appreciate heart failure recommendations, continue IV Lasix 80 mg daily monitor output and volume status -Intolerant to losartan due to hyperkalemia  Slightly elevated high-sensitivity troponin CAD status post CABG (2018).  Suspect demand ischemia in setting of sepsis and A. fib with RVR Not having any anginal equivalents, does have chronic constant left-sided flank pain for several months related to radiation-continue home Eliquis and statin  Worsening left-sided rib cage pain, found to have worsening pre-existing pathologic fractures in thoracic spine on CT chest (T5 and T8) and development of paraspinal soft tissue soft tissue lumbar spine concerning for worsening malignancy.  Pain control improved after IV Toradol -Continue pain control with Home MS Contin, as needed IV Dilaudid,  -Monitor BMP  History of VT/V. fib, status post ICD (2016), history of V. fib arrest in 11/2016 -Has not been able to tolerate amiodarone or beta blockade in the past -Currently on amiodarone 20 mg daily  Metastatic breast cancer with bone mets.  Diagnosed 12/2019. -Continue outpatient follow-up with Dr. Marin Olp, on immunotherapy.  Type 2 diabetes, A1c 7.2 -Holding home glipizide -Monitor CBG, sliding scale as needed  Acute on CKD with hyperkalemia , r. Peak creatinine of 2.2, now back to baseline of 1.6, and potassium elevation has now recurred.  Peak potassium of  6.6 this a.m., slowly downtrending to 5.5 on last check -Lokelma x2, monitor repeat potassium, also on IV Lasix -Avoid nephrotoxins -Monitor BMP     Family Communication  : None  Code Status : DNR,  discussed on day of admission  Disposition Plan  :  Patient is from home. Anticipated d/c date: 2 to 3 days. Barriers to d/c or necessity for inpatient status:  Needs continued IV Lasix for volume status, currently on IV ceftriaxone, requiring close monitoring potassium Consults  : Advanced heart failure, oncology  Procedures  : Venous duplex 8/18  DVT Prophylaxis  : Eliquis Lab Results  Component Value Date   PLT 247 03/22/2020    Diet :  Diet Order            Diet regular Room service appropriate? Yes; Fluid consistency: Thin  Diet effective now                  Inpatient Medications Scheduled Meds: . amiodarone  200 mg Oral Daily  . apixaban  5 mg Oral BID  . exemestane  25 mg Oral QPC breakfast  . furosemide  80 mg Intravenous Daily  . insulin aspart  0-15 Units Subcutaneous TID AC & HS  . ketorolac  30 mg Intravenous Q8H  . mirtazapine  7.5 mg Oral QHS  . morphine  15 mg Oral Q12H  . senna  2 tablet Oral BID   Continuous Infusions: . sodium chloride Stopped (03/22/20 1658)  . cefTRIAXone (ROCEPHIN)  IV 2 g (03/23/20 1508)  . ondansetron (ZOFRAN) IV     PRN Meds:.sodium chloride, acetaminophen **OR** acetaminophen, HYDROmorphone (DILAUDID) injection, morphine, ondansetron **OR** ondansetron (ZOFRAN) IV, polyethylene glycol, promethazine, sodium chloride  Antibiotics  :   Anti-infectives (From admission, onward)   Start     Dose/Rate Route Frequency Ordered Stop   03/20/20 1430  cefTRIAXone (ROCEPHIN) 2 g in sodium chloride 0.9 % 100 mL IVPB        2 g 200 mL/hr over 30 Minutes Intravenous Every 24 hours 03/20/20 1345     03/20/20 0600  vancomycin (VANCOREADY) IVPB 1250 mg/250 mL  Status:  Discontinued        1,250 mg 166.7 mL/hr over 90 Minutes Intravenous Every 48 hours 03/18/20 0230 03/20/20 1345   03/18/20 0330  vancomycin (VANCOREADY) IVPB 1500 mg/300 mL        1,500 mg 150 mL/hr over 120 Minutes Intravenous  Once 03/18/20 0230 03/18/20 0803   03/18/20  0330  meropenem (MERREM) 1 g in sodium chloride 0.9 % 100 mL IVPB  Status:  Discontinued        1 g 200 mL/hr over 30 Minutes Intravenous 2 times daily 03/18/20 0230 03/20/20 1345   03/18/20 0130  piperacillin-tazobactam (ZOSYN) IVPB 3.375 g  Status:  Discontinued        3.375 g 100 mL/hr over 30 Minutes Intravenous  Once 03/18/20 0115 03/18/20 0214       Objective   Vitals:   03/23/20 0430 03/23/20 0500 03/23/20 0806 03/23/20 1051  BP: 105/75  114/63 95/74  Pulse: 82  (!) 106 (!) 111  Resp: 16  18 19   Temp: 97.8 F (36.6 C)  98.7 F (37.1 C) 97.8 F (36.6 C)  TempSrc: Oral  Oral Oral  SpO2: 94%  98% 99%  Weight:  73.5 kg    Height:        SpO2: 99 % O2 Flow Rate (L/min): 0 L/min  Wt Readings from Last 3  Encounters:  03/23/20 73.5 kg  01/30/20 82.9 kg  12/28/19 82.9 kg     Intake/Output Summary (Last 24 hours) at 03/23/2020 1614 Last data filed at 03/23/2020 0300 Gross per 24 hour  Intake 619.7 ml  Output 600 ml  Net 19.7 ml    Physical Exam:     Awake Alert, Oriented X 3, Normal affect No new F.N deficits,  [place].AT, Normal respiratory effort on room air, CTAB Irregularly irregular heart rhythm, tachycardic, 2-3+ pitting edema bilateral lower extremities to knee +ve B.Sounds, Abd Soft, No tenderness, No rebound, guarding or rigidity. Right lateral leg with large area of erythema and weeping lesions Left rib cage pain, reproducible with palpation     I have personally reviewed the following:   Data Reviewed:  CBC Recent Labs  Lab 03/18/20 0354 03/19/20 0151 03/20/20 0248 03/21/20 0449 03/22/20 0954  WBC 23.0* 21.1* 18.1* 12.3* 12.5*  HGB 12.0 11.2* 11.1* 11.3* 11.3*  HCT 39.3 36.8 36.5 37.3 37.7  PLT 290 257 229 235 247  MCV 88.5 86.8 86.9 88.6 88.1  MCH 27.0 26.4 26.4 26.8 26.4  MCHC 30.5 30.4 30.4 30.3 30.0  RDW 22.7* 22.5* 22.9* 23.0* 23.2*  LYMPHSABS 0.5* 0.5* 0.5* 0.6*  --   MONOABS 0.7 0.8 0.8 0.7  --   EOSABS 0.0 0.2 0.3 0.7*  --     BASOSABS 0.1 0.0 0.0 0.1  --     Chemistries  Recent Labs  Lab 03/24/2020 2206 03/31/2020 2225 03/18/20 0354 03/18/20 0354 03/19/20 0151 03/19/20 0151 03/20/20 0248 03/20/20 0248 03/21/20 0449 03/22/20 0954 03/23/20 0245 03/23/20 1127 03/23/20 1505  NA  --    < > 130*   < > 132*   < > 133*  --  132* 131* 128* 132*  --   K  --    < > 5.1   < > 5.4*   < > 4.7   < > 4.4 5.1 6.6* 5.3* 5.5*  CL  --    < > 98   < > 100   < > 101  --  101 99 99 98  --   CO2  --    < > 18*   < > 16*   < > 23  --  21* 20* 17* 20*  --   GLUCOSE  --    < > 139*   < > 67*   < > 138*  --  118* 99 87 110*  --   BUN  --    < > 56*   < > 53*   < > 52*  --  52* 50* 52* 53*  --   CREATININE  --    < > 2.08*   < > 1.90*   < > 1.69*  --  1.67* 1.52* 1.72* 1.80*  --   CALCIUM  --    < > 6.1*   < > 6.2*   < > 6.5*  --  6.5* 6.6* 6.3* 6.6*  --   MG  --   --  1.9  --  2.5*  --   --   --   --   --   --   --   --   AST 38  --  24  --   --   --   --   --   --   --  36  --   --   ALT 22  --  24  --   --   --   --   --   --   --  23  --   --   ALKPHOS 152*  --  154*  --   --   --   --   --   --   --  126  --   --   BILITOT 2.7*  --  2.0*  --   --   --   --   --   --   --  2.1*  --   --    < > = values in this interval not displayed.   ------------------------------------------------------------------------------------------------------------------ No results for input(s): CHOL, HDL, LDLCALC, TRIG, CHOLHDL, LDLDIRECT in the last 72 hours.  Lab Results  Component Value Date   HGBA1C 7.2 (H) 03/18/2020   ------------------------------------------------------------------------------------------------------------------ No results for input(s): TSH, T4TOTAL, T3FREE, THYROIDAB in the last 72 hours.  Invalid input(s): FREET3 ------------------------------------------------------------------------------------------------------------------ No results for input(s): VITAMINB12, FOLATE, FERRITIN, TIBC, IRON, RETICCTPCT in the last  72 hours.  Coagulation profile Recent Labs  Lab 03/18/20 0354  INR 2.5*    No results for input(s): DDIMER in the last 72 hours.  Cardiac Enzymes No results for input(s): CKMB, TROPONINI, MYOGLOBIN in the last 168 hours.  Invalid input(s): CK ------------------------------------------------------------------------------------------------------------------    Component Value Date/Time   BNP 912.0 (H) 03/18/2020 0229   BNP 398.6 (H) 09/13/2015 1158    Micro Results Recent Results (from the past 240 hour(s))  Culture, blood (routine x 2)     Status: None   Collection Time: 03/18/20  2:25 AM   Specimen: BLOOD  Result Value Ref Range Status   Specimen Description BLOOD RIGHT HAND  Final   Special Requests   Final    BOTTLES DRAWN AEROBIC AND ANAEROBIC Blood Culture results may not be optimal due to an excessive volume of blood received in culture bottles   Culture   Final    NO GROWTH 5 DAYS Performed at [facility], [place] [address], [place], [place] [address]    Report Status 03/23/2020 FINAL  Final  SARS Coronavirus 2 by RT PCR (hospital order, performed in [facility] hospital lab) Nasopharyngeal Nasopharyngeal Swab     Status: None   Collection Time: 03/18/20  2:27 AM   Specimen: Nasopharyngeal Swab  Result Value Ref Range Status   SARS Coronavirus 2 NEGATIVE NEGATIVE Final    Comment: (NOTE) SARS-CoV-2 target nucleic acids are NOT DETECTED.  The SARS-CoV-2 RNA is generally detectable in upper and lower respiratory specimens during the acute phase of infection. The lowest concentration of SARS-CoV-2 viral copies this assay can detect is 250 copies / mL. A negative result does not preclude SARS-CoV-2 infection and should not be used as the sole basis for treatment or other patient management decisions.  A negative result may occur with improper specimen collection / handling, submission of specimen other than nasopharyngeal swab, presence of viral mutation(s)  within the areas targeted by this assay, and inadequate number of viral copies (<250 copies / mL). A negative result must be combined with clinical observations, patient history, and epidemiological information.  Fact Sheet for Patients:   StrictlyIdeas.no  Fact Sheet for Healthcare Providers: BankingDealers.co.za  This test is not yet approved or  cleared by the [place] FDA and has been authorized for detection and/or diagnosis of SARS-CoV-2 by FDA under an Emergency Use Authorization (EUA).  This EUA will remain in effect (meaning this test can be used) for the duration of the COVID-19 declaration under Section 564(b)(1) of the Act, 21 U.S.C. section 360bbb-3(b)(1), unless the authorization is terminated  or revoked sooner.  Performed at [facility], [place] [address], [place], [place] [address]   Culture, blood (routine x 2)     Status: None   Collection Time: 03/18/20  2:29 AM   Specimen: BLOOD  Result Value Ref Range Status   Specimen Description BLOOD RIGHT ARM  Final   Special Requests   Final    BOTTLES DRAWN AEROBIC ONLY Blood Culture adequate volume   Culture   Final    NO GROWTH 5 DAYS Performed at [facility], [address] [place], [place] [address]    Report Status 03/23/2020 FINAL  Final  MRSA PCR Screening     Status: None   Collection Time: 03/18/20  6:13 AM   Specimen: Nasopharyngeal  Result Value Ref Range Status   MRSA by PCR NEGATIVE NEGATIVE Final    Comment:        The GeneXpert MRSA Assay (FDA approved for NASAL specimens only), is one component of a comprehensive MRSA colonization surveillance program. It is not intended to diagnose MRSA infection nor to guide or monitor treatment for MRSA infections. Performed at [facility], [place] [address], [place], [place] [address]     Radiology Reports DG Chest 2 View  Result Date: 03/19/2020 CLINICAL DATA:  Chest pain EXAM:  CHEST - 2 VIEW COMPARISON:  12/04/2019, PET CT 12/22/2019 FINDINGS: Post sternotomy changes with left atrial appendage clip. Left-sided pacing device as before. Cardiomegaly with mildly enlarged central pulmonary vessels. No overt edema. No consolidation, pleural effusion, or pneumothorax. Moderate compression deformity of mid to lower thoracic vertebra, new or increased as compared to chest CT from May 2021. IMPRESSION: Cardiomegaly without overt failure. New mild to moderate compression deformity of mid to lower thoracic vertebra. Electronically Signed   By: Donavan Foil M.D.   On: 03/05/2020 20:18   CT CHEST WO CONTRAST  Addendum Date: 03/22/2020   ADDENDUM REPORT: 03/22/2020 09:53 ADDENDUM: Spine fractures with 2 column involvement at T5 and T8 were discussed along with worsening spinal alignment and paraspinal soft tissue along the upper thoracic spine were discussed with the provider is outlined below. MRI could be considered if cardiac device will allow. These results were called by telephone at the time of interpretation on 03/22/2020 at 9:53 am to provider [facility] , who verbally acknowledged these results. Electronically Signed   By: Zetta Bills M.D.   On: 03/22/2020 09:53   Result Date: 03/22/2020 CLINICAL DATA:  Breast cancer, surveillance of metastatic disease, significant pain reported to LEFT lateral chest. EXAM: CT CHEST WITHOUT CONTRAST TECHNIQUE: Multidetector CT imaging of the chest was performed following the standard protocol without IV contrast. COMPARISON:  Dec 04, 2019 and PET evaluation of Dec 22, 2019 FINDINGS: Cardiovascular: Calcified atheromatous plaque of the thoracic aorta. No aneurysmal dilation. Post median sternotomy for CABG with unchanged cardiac enlargement. Mitral annular calcification and cardiac pacer defibrillator and LEFT atrial clipping as before. Engorgement of central pulmonary vasculature with similar appearance. Limited assessment of vascular structures in  the chest due to lack of intravenous contrast. Mediastinum/Nodes: No adenopathy at the thoracic inlet. No adenopathy in the axilla. No mediastinal lymphadenopathy. Esophagus mildly patulous. Lungs/Pleura: No pleural effusion. Interstitial thickening at the lung apices is persistent and somewhat irregular. For instance on image 29 of series 4 there is more profound interstitial thickening with a bandlike appearance changes are seen in the medial chest with similar appearance. In the RIGHT upper lobe a bandlike area extends  from the hilum into the RIGHT upper lobe. Some scarring along the RIGHT heart border with mild bronchial dilation with slight increase since the previous study. No discrete mass. Upper Abdomen: Incidental imaging of upper abdominal contents show no acute process to the extent evaluated. Adrenal glands are normal. Musculoskeletal: Spinous process fracture incidentally noted at C7, incompletely imaged. Slight interval decrease in height of the T1 vertebral body anteriorly compatible with mild compression approximately 10%. This was likely present previously. Interval development of pathologic fractures of the thoracic spine since previous imaging with worsening soft tissue about the upper thoracic spine at the level of T2, T3 and T4 with pathologic fractures at all these levels. T2 with minimal loss of height and T3 and T4 with approximately 20% loss of height. Also with approximately 50% loss of height at the T5 level worse on the LEFT with resultant dextroconvexity of the spine at this location that was mild previously and is now moderate in terms of curvature. The compression fractures present at T4-T5 on the prior study have worsened in the interval. Interval approximately 40-50% loss of height of the T8 vertebral body. Fracture at this level and at the T5 level displaying signs of posterior cortical involvement of the vertebral body. Only very minimal retropulsion is noted at these levels.  Interval 20% loss of height of the L1 vertebral body since the prior study. Diffuse heterogeneity of visualized bones in this patient with known widespread bony metastases. LEFT breast mass may be slightly smaller than on the previous study measuring approximately 3.6 x 1.3 cm. Greatest thickness on the prior study was approximately 2.4 cm. IMPRESSION: 1. Worsening of pre-existing pathologic fractures in the thoracic spine with new T8 compression and new L1 compression as described. There is mild posterior cortical retropulsion at the T5 and T8 levels. 2. Development of soft tissue along lateral margin of the spine likely related to worsening malignancy. MRI of the spine could be helpful as clinically warranted for further assessment. 3. Septal thickening at the lung apices could be a chronic finding but could also be seen in the setting of lymphangitic carcinomatosis. Attention on follow-up. 4. Breast mass may be slightly smaller than on the previous study. 5. Similar appearance of central pulmonary arterial enlargement likely related to underlying pulmonary arterial hypertension. 6. Signs of cardiac disease as before and changes of median sternotomy for CABG. A call is out to the referring provider to further discuss findings in the above case. Aortic Atherosclerosis (ICD10-I70.0). Electronically Signed: By: Zetta Bills M.D. On: 03/22/2020 09:42   VAS [place] LOWER EXTREMITY VENOUS (DVT)  Result Date: 03/20/2020  Lower Venous DVTStudy Indications: Edema.  Risk Factors: Cancer Breast. Limitations: Bandages and body habitus. Comparison Study: Performing Technologist: Antonieta Pert RDMS, RVT  Examination Guidelines: A complete evaluation includes B-mode imaging, spectral Doppler, color Doppler, and power Doppler as needed of all accessible portions of each vessel. Bilateral testing is considered an integral part of a complete examination. Limited examinations for reoccurring indications may be performed as noted.  The reflux portion of the exam is performed with the patient in reverse Trendelenburg.  +---------+---------------+---------+-----------+----------+--------------+ RIGHT    CompressibilityPhasicitySpontaneityPropertiesThrombus Aging +---------+---------------+---------+-----------+----------+--------------+ CFV      Full           Yes      Yes                                 +---------+---------------+---------+-----------+----------+--------------+  SFJ      Full                                                        +---------+---------------+---------+-----------+----------+--------------+ FV Prox  Full                                                        +---------+---------------+---------+-----------+----------+--------------+ FV Mid   Full                                                        +---------+---------------+---------+-----------+----------+--------------+ FV DistalFull                                                        +---------+---------------+---------+-----------+----------+--------------+ PFV      Full                                                        +---------+---------------+---------+-----------+----------+--------------+ POP      Full           Yes      Yes                                 +---------+---------------+---------+-----------+----------+--------------+ PTV                                                   Not visualized +---------+---------------+---------+-----------+----------+--------------+ PERO                                                  Not visualized +---------+---------------+---------+-----------+----------+--------------+ GSV      Full                                                        +---------+---------------+---------+-----------+----------+--------------+   +---------+---------------+---------+-----------+----------+--------------+ LEFT      CompressibilityPhasicitySpontaneityPropertiesThrombus Aging +---------+---------------+---------+-----------+----------+--------------+ CFV      Full                                                        +---------+---------------+---------+-----------+----------+--------------+  SFJ      Full                                                        +---------+---------------+---------+-----------+----------+--------------+ FV Prox  Full                                                        +---------+---------------+---------+-----------+----------+--------------+ FV Mid   Full                                                        +---------+---------------+---------+-----------+----------+--------------+ FV DistalFull                                                        +---------+---------------+---------+-----------+----------+--------------+ PFV      Full                                                        +---------+---------------+---------+-----------+----------+--------------+ POP      Full                                                        +---------+---------------+---------+-----------+----------+--------------+ PTV                                                   Not visualized +---------+---------------+---------+-----------+----------+--------------+ PERO                                                  Not visualized +---------+---------------+---------+-----------+----------+--------------+ GSV      Full                                                        +---------+---------------+---------+-----------+----------+--------------+ hypoechoic fluid collection posterior left popliteal fossa 3.9cm    Summary: RIGHT: - There is no evidence of deep vein thrombosis in the lower extremity. However, portions of this examination were limited- see technologist comments above.  - No cystic structure found in the popliteal  fossa. - calf veins not visulaized due to bandages  LEFT: - There is no  evidence of deep vein thrombosis in the lower extremity. However, portions of this examination were limited- see technologist comments above.  - A cystic structure is found in the popliteal fossa. - calf veins not visulaized due to bandages  *See table(s) above for measurements and observations. Electronically signed by Deitra Mayo MD on 03/20/2020 at 5:10:34 PM.    Final      Time Spent in minutes  30     Desiree Hane M.D on 03/23/2020 at 4:14 PM  To page go to [email or web address] - password [facility]

## 2020-03-23 NOTE — Progress Notes (Signed)
Unfortunately, looks like the breast cancer may be progressing.  I really not too surprised by this given that her immunotherapy has only given 1 dose.  It typically needs 3-4 doses before we start to see a response with immunotherapy.  Sometimes, we can see pseudoprogression.  She is certainly more comfortable today.  I suspect that the Toradol is probably what is helping.  I think some anti-inflammatory as an outpatient would help her out.  I think that we should consider one of the aromatase inhibitors for her.  Her breast cancer is estrogen positive.  I will give her a dose of Faslodex today.  I am not sure why her potassium is so high.  She still has a lot of issues with her legs.  She a lot of swelling in her legs.  Her albumin is a little bit on the low side.  She may benefit from some IV albumin followed by Lasix.  We could certainly get radiation oncology to see her for any palliative radiation to her spine.  I know she has had this before.  I am not sure exactly where she has had past radiation.  She has had no fever.  Her potassium is quite high 6.6.  Her sodium is 128.  Her glucose is 87.  Her creatinine is 1.72.  Her bilirubin is 2.1.  She still wants to try to help the breast cancer.  I do think this is reasonable.  I know that she is not a candidate for any chemotherapy.  I still think that we had to give immunotherapy a chance to work.  I think she is due next week for her next dose of immunotherapy.   Her atrial fibrillation seems to be under a little bit better control.  The rate is not as high.  We still have to focus on her quality of life.  I am very much appreciate the great care that she is getting from all the staff on [address].  Lattie Haw, MD  Oswaldo Milian 26:4

## 2020-03-23 NOTE — Progress Notes (Signed)
Hydrologist [facility])  Hospital Liaison: RN note       Notified by [facility] manager of patient/family request for [facility] Palliative services at home after discharge.       Writer spoke with patient to confirm interest and explain services. Patient at first was undecided on Palliative care at home but then agreed that she may benefit.          [place] [facility] team will follow up with patient after discharge.       Please call with any hospice or palliative related questions.       Thank you for this referral.       Clementeen Hoof, RN, BSN [place] (listed on [place] under [facility] [place] [facility])   [phone]

## 2020-03-23 NOTE — Progress Notes (Signed)
Spoke with Chrystal from pharmacy regarding regarding the medication Fulvestrant IM. This drug can be given IM by the nursing staff. This is a hormonal therapy. Thank you.

## 2020-03-24 DIAGNOSIS — L03116 Cellulitis of left lower limb: Secondary | ICD-10-CM

## 2020-03-24 DIAGNOSIS — I5023 Acute on chronic systolic (congestive) heart failure: Secondary | ICD-10-CM

## 2020-03-24 DIAGNOSIS — N179 Acute kidney failure, unspecified: Secondary | ICD-10-CM

## 2020-03-24 DIAGNOSIS — E875 Hyperkalemia: Secondary | ICD-10-CM

## 2020-03-24 LAB — BASIC METABOLIC PANEL
Anion gap: 14 (ref 5–15)
BUN: 53 mg/dL — ABNORMAL HIGH (ref 8–23)
CO2: 22 mmol/L (ref 22–32)
Calcium: 6.8 mg/dL — ABNORMAL LOW (ref 8.9–10.3)
Chloride: 97 mmol/L — ABNORMAL LOW (ref 98–111)
Creatinine, Ser: 1.87 mg/dL — ABNORMAL HIGH (ref 0.44–1.00)
GFR calc Af Amer: 31 mL/min — ABNORMAL LOW (ref >60)
GFR calc non Af Amer: 26 mL/min — ABNORMAL LOW (ref >60)
Glucose, Bld: 106 mg/dL — ABNORMAL HIGH (ref 70–99)
Potassium: 5 mmol/L (ref 3.5–5.1)
Sodium: 133 mmol/L — ABNORMAL LOW (ref 135–145)

## 2020-03-24 LAB — NA AND K (SODIUM & POTASSIUM), RAND UR
Potassium Urine: 37 mmol/L
Sodium, Ur: 23 mmol/L

## 2020-03-24 LAB — GLUCOSE, CAPILLARY
Glucose-Capillary: 73 mg/dL (ref 70–99)
Glucose-Capillary: 123 mg/dL — ABNORMAL HIGH (ref 70–99)
Glucose-Capillary: 153 mg/dL — ABNORMAL HIGH (ref 70–99)
Glucose-Capillary: 77 mg/dL (ref 70–99)

## 2020-03-24 LAB — POTASSIUM
Potassium: 5 mmol/L (ref 3.5–5.1)
Potassium: 4.5 mmol/L (ref 3.5–5.1)

## 2020-03-24 NOTE — Progress Notes (Signed)
[facility]  PROGRESS NOTE  [name] BHA:[id number] DOB: [birth year] DOA: 03/19/2020 PCP: Patient, No Pcp Per Admit date - 03/23/2020   Admitting Physician Vernelle Emerald, MD  Outpatient Primary MD for the patient is Patient, No Pcp Per  LOS - 6 Brief Narrative   [name] is a 74 y.o. 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 fib status post ICD, breast cancer with bone mets/Lynch syndrome who presented on 8/14 with several days of shortness of breath, worsening redness of lower legs and weeping lesions for the past 2 weeks in setting of using Derm stone to scrape her dry skin and found to have A. fib with RVR to the 120s and sepsis physiology (white count of 24.5, lactic acid 2.2, Respiratory rate of 21) secondary to bilateral lower extremity cellulitis course complicated by A. fib with RVR, AKI on CKD with hyperkalemia    Subjective  Eating fruit, still minimal appetite. No CP. Feels rib cage pain stable  A & P  Sepsis secondary to nonpurulent cellulitis of bilateral lower extremities.  Unfortunate consequence of exfoliativestone use to skin.  Remains afebrile, white count downtrending, blood cultures unremarkable, sepsis physiology resolved. (Vancomycin 8/14, 8/16 and Meropenem 8/15-8/17) -Continue IV ceftriaxone, to complete 7 day course,end date 8/22.   A. fib with RVR, likely exacerbated in setting of sepsis due to above, persists.  Heart rate remains in the 110s to 120s .  Patient's been unable to tolerate IV amiodarone due to nausea in the past, CCB is not an option with decreased EF, hypotension to prior beta-blockade (patient declines IV amiodarone or higher p.o. dosing) -Appreciate cardiology recommendations, continue low-dose p.o. amiodarone 200 mg daily -Hopeful continue treatment of infection  will improve heart rates  Acute on chronic, systolic/diastolic CHF.  Still quite volume overloaded on exam, some slight improvement in bilateral lower extremity swelling with 2-3+ pitting edema bilateral lower extremities.  In the setting of sepsis on admission and holding home diuretics.  BNP in the 900s on admission -Appreciate heart failure recommendations, continue IV Lasix 80 mg once and  monitor output and volume status and kidney function -Intolerant to losartan due to hyperkalemia  Slightly elevated high-sensitivity troponin CAD status post CABG (2018).  Suspect demand ischemia in setting of sepsis and A. fib with RVR Not having any anginal equivalents, does have chronic constant left-sided flank pain for several months related to radiation-continue home Eliquis and statin  Worsening left-sided rib cage pain, found to have worsening pre-existing pathologic fractures in thoracic spine on CT chest (T5 and T8) and development of paraspinal soft tissue soft tissue lumbar spine concerning for worsening malignancy.  Pain control improved after IV Toradol -Continue pain control with Home MS Contin, as needed IV Dilaudid,  -Monitor BMP  History of VT/V. fib, status post ICD (2016), history of V. fib arrest in 11/2016 -Has not been able to tolerate amiodarone or beta blockade in the past -Currently on amiodarone 20 mg daily  Metastatic breast cancer with bone mets.  Diagnosed 12/2019. -Continue outpatient follow-up with Dr. Marin Olp, on immunotherapy.  Type 2 diabetes, A1c 7.2 -Holding home glipizide -Monitor CBG, sliding scale as needed  Acute on CKD with hyperkalemia ,  Peak creatinine of 2.2, baseline of ~1.6. Required lokelma on 8/20 for recurrent hyperkalemia, now improved at 5. But Creatinine continues to  elevate to 1.87 despite diuresis holiday on 8/20, Could be related to recent NSAID use on 8/19 -Avoid nephrotoxins (had toradol x3 for back pain on 8/19) -Monitor BMP --Monitor  output     Family Communication  : None  Code Status : DNR, discussed on day of admission  Disposition Plan  :  Patient is from home. Anticipated d/c date: 2 to 3 days. Barriers to d/c or necessity for inpatient status:  Needs continued IV Lasix for volume status, currently on IV ceftriaxone, requiring close monitoring potassium and kidney function Consults  : Advanced heart failure, oncology  Procedures  : Venous duplex 8/18  DVT Prophylaxis  : Eliquis Lab Results  Component Value Date   PLT 247 03/22/2020    Diet :  Diet Order            Diet regular Room service appropriate? Yes; Fluid consistency: Thin  Diet effective now                  Inpatient Medications Scheduled Meds:  amiodarone  200 mg Oral Daily   apixaban  5 mg Oral BID   exemestane  25 mg Oral QPC breakfast   furosemide  80 mg Intravenous Daily   insulin aspart  0-15 Units Subcutaneous TID AC & HS   mirtazapine  7.5 mg Oral QHS   morphine  15 mg Oral Q12H   senna  2 tablet Oral BID   Continuous Infusions:  sodium chloride Stopped (03/22/20 1658)   cefTRIAXone (ROCEPHIN)  IV 2 g (03/23/20 1508)   ondansetron (ZOFRAN) IV     PRN Meds:.sodium chloride, acetaminophen **OR** acetaminophen, HYDROmorphone (DILAUDID) injection, morphine, ondansetron **OR** ondansetron (ZOFRAN) IV, polyethylene glycol, promethazine, sodium chloride  Antibiotics  :   Anti-infectives (From admission, onward)   Start     Dose/Rate Route Frequency Ordered Stop   03/20/20 1430  cefTRIAXone (ROCEPHIN) 2 g in sodium chloride 0.9 % 100 mL IVPB        2 g 200 mL/hr over 30 Minutes Intravenous Every 24 hours 03/20/20 1345     03/20/20 0600  vancomycin (VANCOREADY) IVPB 1250 mg/250 mL  Status:  Discontinued        1,250 mg 166.7 mL/hr over 90 Minutes Intravenous Every 48 hours 03/18/20 0230 03/20/20 1345   03/18/20 0330  vancomycin (VANCOREADY) IVPB 1500 mg/300 mL        1,500 mg 150 mL/hr over 120 Minutes  Intravenous  Once 03/18/20 0230 03/18/20 0803   03/18/20 0330  meropenem (MERREM) 1 g in sodium chloride 0.9 % 100 mL IVPB  Status:  Discontinued        1 g 200 mL/hr over 30 Minutes Intravenous 2 times daily 03/18/20 0230 03/20/20 1345   03/18/20 0130  piperacillin-tazobactam (ZOSYN) IVPB 3.375 g  Status:  Discontinued        3.375 g 100 mL/hr over 30 Minutes Intravenous  Once 03/18/20 0115 03/18/20 0214       Objective   Vitals:   03/24/20 0042 03/24/20 0440 03/24/20 0852 03/24/20 0854  BP: 105/78 116/63 121/68 121/68  Pulse: 93 95 (!) 106 (!) 106  Resp: 18 18 20 20   Temp: (!) 97.5 F (36.4 C) 97.9 F (36.6 C) 97.6 F (36.4 C) 97.6 F (36.4 C)  TempSrc: Oral Oral Oral   SpO2: 96% 93% 98%   Weight:      Height:        SpO2: 98 % O2 Flow Rate (L/min): 0 L/min  Wt Readings from Last 3 Encounters:  03/23/20 73.5 kg  01/30/20 82.9 kg  12/28/19 82.9 kg     Intake/Output Summary (Last 24 hours) at 03/24/2020 1111 Last data filed at 03/24/2020 0854 Gross per 24 hour  Intake 240 ml  Output 700 ml  Net -460 ml    Physical Exam:     Awake Alert, Oriented X 3, Normal affect No new F.N deficits,  .AT, Normal respiratory effort on room air, CTAB Irregularly irregular heart rhythm, tachycardic, 2-3+ pitting edema bilateral lower extremities to knee +ve B.Sounds, Abd Soft, No tenderness, No rebound, guarding or rigidity. Right lateral leg with large area of erythema and weeping lesions Left rib cage pain, reproducible with palpation     I have personally reviewed the following:   Data Reviewed:  CBC Recent Labs  Lab 03/18/20 0354 03/19/20 0151 03/20/20 0248 03/21/20 0449 03/22/20 0954  WBC 23.0* 21.1* 18.1* 12.3* 12.5*  HGB 12.0 11.2* 11.1* 11.3* 11.3*  HCT 39.3 36.8 36.5 37.3 37.7  PLT 290 257 229 235 247  MCV 88.5 86.8 86.9 88.6 88.1  MCH 27.0 26.4 26.4 26.8 26.4  MCHC 30.5 30.4 30.4 30.3 30.0  RDW 22.7* 22.5* 22.9* 23.0* 23.2*  LYMPHSABS 0.5*  0.5* 0.5* 0.6*  --   MONOABS 0.7 0.8 0.8 0.7  --   EOSABS 0.0 0.2 0.3 0.7*  --   BASOSABS 0.1 0.0 0.0 0.1  --     Chemistries  Recent Labs  Lab 03/16/2020 2206 03/04/2020 2225 03/18/20 0354 03/18/20 0354 03/19/20 0151 03/20/20 0248 03/21/20 0449 03/21/20 0449 03/22/20 0954 03/22/20 0954 03/23/20 0245 03/23/20 0245 03/23/20 1127 03/23/20 1505 03/23/20 1806 03/23/20 2316 03/24/20 0714  NA  --    < > 130*   < > 132*   < > 132*  --  131*  --  128*  --  132*  --   --   --  133*  K  --    < > 5.1   < > 5.4*   < > 4.4   < > 5.1   < > 6.6*   < > 5.3* 5.5* 5.2* 5.6* 5.0  CL  --    < > 98   < > 100   < > 101  --  99  --  99  --  98  --   --   --  97*  CO2  --    < > 18*   < > 16*   < > 21*  --  20*  --  17*  --  20*  --   --   --  22  GLUCOSE  --    < > 139*   < > 67*   < > 118*  --  99  --  87  --  110*  --   --   --  106*  BUN  --    < > 56*   < > 53*   < > 52*  --  50*  --  52*  --  53*  --   --   --  53*  CREATININE  --    < > 2.08*   < > 1.90*   < > 1.67*  --  1.52*  --  1.72*  --  1.80*  --   --   --  1.87*  CALCIUM  --    < > 6.1*   < > 6.2*   < > 6.5*  --  6.6*  --  6.3*  --  6.6*  --   --   --  6.8*  MG  --   --  1.9  --  2.5*  --   --   --   --   --   --   --   --   --   --   --   --   AST 38  --  24  --   --   --   --   --   --   --  36  --   --   --   --   --   --   ALT 22  --  24  --   --   --   --   --   --   --  23  --   --   --   --   --   --   ALKPHOS 152*  --  154*  --   --   --   --   --   --   --  126  --   --   --   --   --   --   BILITOT 2.7*  --  2.0*  --   --   --   --   --   --   --  2.1*  --   --   --   --   --   --    < > = values in this interval not displayed.   ------------------------------------------------------------------------------------------------------------------ No results for input(s): CHOL, HDL, LDLCALC, TRIG, CHOLHDL, LDLDIRECT in the last 72 hours.  Lab Results  Component Value Date   HGBA1C 7.2 (H) 03/18/2020    ------------------------------------------------------------------------------------------------------------------ No results for input(s): TSH, T4TOTAL, T3FREE, THYROIDAB in the last 72 hours.  Invalid input(s): FREET3 ------------------------------------------------------------------------------------------------------------------ No results for input(s): VITAMINB12, FOLATE, FERRITIN, TIBC, IRON, RETICCTPCT in the last 72 hours.  Coagulation profile Recent Labs  Lab 03/18/20 0354  INR 2.5*    No results for input(s): DDIMER in the last 72 hours.  Cardiac Enzymes No results for input(s): CKMB, TROPONINI, MYOGLOBIN in the last 168 hours.  Invalid input(s): CK ------------------------------------------------------------------------------------------------------------------    Component Value Date/Time   BNP 912.0 (H) 03/18/2020 0229   BNP 398.6 (H) 09/13/2015 1158    Micro Results Recent Results (from the past 240 hour(s))  Culture, blood (routine x 2)     Status: None   Collection Time: 03/18/20  2:25 AM   Specimen: BLOOD  Result Value Ref Range Status   Specimen Description BLOOD RIGHT HAND  Final   Special Requests   Final    BOTTLES DRAWN AEROBIC AND ANAEROBIC Blood Culture results may not be optimal due to an excessive volume of blood received in culture bottles   Culture   Final    NO GROWTH 5 DAYS Performed at [facility], [place] [address] [place], [place] [address]    Report Status 03/23/2020 FINAL  Final  SARS Coronavirus 2 by RT PCR (hospital order, performed in [facility] hospital lab) Nasopharyngeal Nasopharyngeal Swab     Status: None   Collection Time: 03/18/20  2:27 AM   Specimen: Nasopharyngeal Swab  Result Value Ref Range Status   SARS Coronavirus 2 NEGATIVE NEGATIVE Final    Comment: (NOTE) SARS-CoV-2 target nucleic acids are NOT DETECTED.  The SARS-CoV-2 RNA is generally detectable in upper and lower respiratory specimens during the acute  phase  of infection. The lowest concentration of SARS-CoV-2 viral copies this assay can detect is 250 copies / mL. A negative result does not preclude SARS-CoV-2 infection and should not be used as the sole basis for treatment or other patient management decisions.  A negative result may occur with improper specimen collection / handling, submission of specimen other than nasopharyngeal swab, presence of viral mutation(s) within the areas targeted by this assay, and inadequate number of viral copies (<250 copies / mL). A negative result must be combined with clinical observations, patient history, and epidemiological information.  Fact Sheet for Patients:   StrictlyIdeas.no  Fact Sheet for Healthcare Providers: BankingDealers.co.za  This test is not yet approved or  cleared by the [place] FDA and has been authorized for detection and/or diagnosis of SARS-CoV-2 by FDA under an Emergency Use Authorization (EUA).  This EUA will remain in effect (meaning this test can be used) for the duration of the COVID-19 declaration under Section 564(b)(1) of the Act, 21 U.S.C. section 360bbb-3(b)(1), unless the authorization is terminated or revoked sooner.  Performed at [facility], [place] [address] [place], [place] [address]   Culture, blood (routine x 2)     Status: None   Collection Time: 03/18/20  2:29 AM   Specimen: BLOOD  Result Value Ref Range Status   Specimen Description BLOOD RIGHT ARM  Final   Special Requests   Final    BOTTLES DRAWN AEROBIC ONLY Blood Culture adequate volume   Culture   Final    NO GROWTH 5 DAYS Performed at [facility], [address] [place], [place] [address]    Report Status 03/23/2020 FINAL  Final  MRSA PCR Screening     Status: None   Collection Time: 03/18/20  6:13 AM   Specimen: Nasopharyngeal  Result Value Ref Range Status   MRSA by PCR NEGATIVE NEGATIVE Final    Comment:        The  GeneXpert MRSA Assay (FDA approved for NASAL specimens only), is one component of a comprehensive MRSA colonization surveillance program. It is not intended to diagnose MRSA infection nor to guide or monitor treatment for MRSA infections. Performed at [facility], [place] [address], [place], [place] [address]     Radiology Reports DG Chest 2 View  Result Date: 03/11/2020 CLINICAL DATA:  Chest pain EXAM: CHEST - 2 VIEW COMPARISON:  12/04/2019, PET CT 12/22/2019 FINDINGS: Post sternotomy changes with left atrial appendage clip. Left-sided pacing device as before. Cardiomegaly with mildly enlarged central pulmonary vessels. No overt edema. No consolidation, pleural effusion, or pneumothorax. Moderate compression deformity of mid to lower thoracic vertebra, new or increased as compared to chest CT from May 2021. IMPRESSION: Cardiomegaly without overt failure. New mild to moderate compression deformity of mid to lower thoracic vertebra. Electronically Signed   By: Donavan Foil M.D.   On: 04/03/2020 20:18   CT CHEST WO CONTRAST  Addendum Date: 03/22/2020   ADDENDUM REPORT: 03/22/2020 09:53 ADDENDUM: Spine fractures with 2 column involvement at T5 and T8 were discussed along with worsening spinal alignment and paraspinal soft tissue along the upper thoracic spine were discussed with the provider is outlined below. MRI could be considered if cardiac device will allow. These results were called by telephone at the time of interpretation on 03/22/2020 at 9:53 am to provider [facility] , who verbally acknowledged these results. Electronically Signed   By: Zetta Bills M.D.   On: 03/22/2020 09:53  Result Date: 03/22/2020 CLINICAL DATA:  Breast cancer, surveillance of metastatic disease, significant pain reported to LEFT lateral chest. EXAM: CT CHEST WITHOUT CONTRAST TECHNIQUE: Multidetector CT imaging of the chest was performed following the standard protocol without IV contrast. COMPARISON:  Dec 04, 2019 and PET evaluation of Dec 22, 2019 FINDINGS: Cardiovascular: Calcified atheromatous plaque of the thoracic aorta. No aneurysmal dilation. Post median sternotomy for CABG with unchanged cardiac enlargement. Mitral annular calcification and cardiac pacer defibrillator and LEFT atrial clipping as before. Engorgement of central pulmonary vasculature with similar appearance. Limited assessment of vascular structures in the chest due to lack of intravenous contrast. Mediastinum/Nodes: No adenopathy at the thoracic inlet. No adenopathy in the axilla. No mediastinal lymphadenopathy. Esophagus mildly patulous. Lungs/Pleura: No pleural effusion. Interstitial thickening at the lung apices is persistent and somewhat irregular. For instance on image 29 of series 4 there is more profound interstitial thickening with a bandlike appearance changes are seen in the medial chest with similar appearance. In the RIGHT upper lobe a bandlike area extends from the hilum into the RIGHT upper lobe. Some scarring along the RIGHT heart border with mild bronchial dilation with slight increase since the previous study. No discrete mass. Upper Abdomen: Incidental imaging of upper abdominal contents show no acute process to the extent evaluated. Adrenal glands are normal. Musculoskeletal: Spinous process fracture incidentally noted at C7, incompletely imaged. Slight interval decrease in height of the T1 vertebral body anteriorly compatible with mild compression approximately 10%. This was likely present previously. Interval development of pathologic fractures of the thoracic spine since previous imaging with worsening soft tissue about the upper thoracic spine at the level of T2, T3 and T4 with pathologic fractures at all these levels. T2 with minimal loss of height and T3 and T4 with approximately 20% loss of height. Also with approximately 50% loss of height at the T5 level worse on the LEFT with resultant dextroconvexity of the spine at  this location that was mild previously and is now moderate in terms of curvature. The compression fractures present at T4-T5 on the prior study have worsened in the interval. Interval approximately 40-50% loss of height of the T8 vertebral body. Fracture at this level and at the T5 level displaying signs of posterior cortical involvement of the vertebral body. Only very minimal retropulsion is noted at these levels. Interval 20% loss of height of the L1 vertebral body since the prior study. Diffuse heterogeneity of visualized bones in this patient with known widespread bony metastases. LEFT breast mass may be slightly smaller than on the previous study measuring approximately 3.6 x 1.3 cm. Greatest thickness on the prior study was approximately 2.4 cm. IMPRESSION: 1. Worsening of pre-existing pathologic fractures in the thoracic spine with new T8 compression and new L1 compression as described. There is mild posterior cortical retropulsion at the T5 and T8 levels. 2. Development of soft tissue along lateral margin of the spine likely related to worsening malignancy. MRI of the spine could be helpful as clinically warranted for further assessment. 3. Septal thickening at the lung apices could be a chronic finding but could also be seen in the setting of lymphangitic carcinomatosis. Attention on follow-up. 4. Breast mass may be slightly smaller than on the previous study. 5. Similar appearance of central pulmonary arterial enlargement likely related to underlying pulmonary arterial hypertension. 6. Signs of cardiac disease as before and changes of median sternotomy for CABG. A call is out to the referring provider to further discuss findings in  the above case. Aortic Atherosclerosis (ICD10-I70.0). Electronically Signed: By: Zetta Bills M.D. On: 03/22/2020 09:42   VAS [place] LOWER EXTREMITY VENOUS (DVT)  Result Date: 03/20/2020  Lower Venous DVTStudy Indications: Edema.  Risk Factors: Cancer Breast. Limitations:  Bandages and body habitus. Comparison Study: Performing Technologist: Antonieta Pert RDMS, RVT  Examination Guidelines: A complete evaluation includes B-mode imaging, spectral Doppler, color Doppler, and power Doppler as needed of all accessible portions of each vessel. Bilateral testing is considered an integral part of a complete examination. Limited examinations for reoccurring indications may be performed as noted. The reflux portion of the exam is performed with the patient in reverse Trendelenburg.  +---------+---------------+---------+-----------+----------+--------------+  RIGHT     Compressibility Phasicity Spontaneity Properties Thrombus Aging  +---------+---------------+---------+-----------+----------+--------------+  CFV       Full            Yes       Yes                                    +---------+---------------+---------+-----------+----------+--------------+  SFJ       Full                                                             +---------+---------------+---------+-----------+----------+--------------+  FV Prox   Full                                                             +---------+---------------+---------+-----------+----------+--------------+  FV Mid    Full                                                             +---------+---------------+---------+-----------+----------+--------------+  FV Distal Full                                                             +---------+---------------+---------+-----------+----------+--------------+  PFV       Full                                                             +---------+---------------+---------+-----------+----------+--------------+  POP       Full            Yes       Yes                                    +---------+---------------+---------+-----------+----------+--------------+  PTV  Not visualized   +---------+---------------+---------+-----------+----------+--------------+  PERO                                                       Not visualized  +---------+---------------+---------+-----------+----------+--------------+  GSV       Full                                                             +---------+---------------+---------+-----------+----------+--------------+   +---------+---------------+---------+-----------+----------+--------------+  LEFT      Compressibility Phasicity Spontaneity Properties Thrombus Aging  +---------+---------------+---------+-----------+----------+--------------+  CFV       Full                                                             +---------+---------------+---------+-----------+----------+--------------+  SFJ       Full                                                             +---------+---------------+---------+-----------+----------+--------------+  FV Prox   Full                                                             +---------+---------------+---------+-----------+----------+--------------+  FV Mid    Full                                                             +---------+---------------+---------+-----------+----------+--------------+  FV Distal Full                                                             +---------+---------------+---------+-----------+----------+--------------+  PFV       Full                                                             +---------+---------------+---------+-----------+----------+--------------+  POP       Full                                                             +---------+---------------+---------+-----------+----------+--------------+  PTV                                                        Not visualized  +---------+---------------+---------+-----------+----------+--------------+  PERO                                                       Not visualized   +---------+---------------+---------+-----------+----------+--------------+  GSV       Full                                                             +---------+---------------+---------+-----------+----------+--------------+ hypoechoic fluid collection posterior left popliteal fossa 3.9cm    Summary: RIGHT: - There is no evidence of deep vein thrombosis in the lower extremity. However, portions of this examination were limited- see technologist comments above.  - No cystic structure found in the popliteal fossa. - calf veins not visulaized due to bandages  LEFT: - There is no evidence of deep vein thrombosis in the lower extremity. However, portions of this examination were limited- see technologist comments above.  - A cystic structure is found in the popliteal fossa. - calf veins not visulaized due to bandages  *See table(s) above for measurements and observations. Electronically signed by Deitra Mayo MD on 03/20/2020 at 5:10:34 PM.    Final      Time Spent in minutes  30     Desiree Hane M.D on 03/24/2020 at 11:11 AM  To page go to [email or web address] - password [facility]

## 2020-03-24 NOTE — Progress Notes (Signed)
Cardiology Rounding Note  PCP-Cardiologist: No primary care provider on file.   Patient Profile   74 y/o woman with recently diagnosed metastatic breast CA (undergoing XRT), CAD s/p CABG, DM2, systolic HF with EF 09-62% and PAF.   Admitted with sepsis and lactic acidosis due to severe LE cellulitis in setting of marked volume overload. Course complicated by AF with RVR.  Subjective:     K 6.6 --> 5.0 today. Received dose of Lokelma. Persistent afib, rates 90-low 100s.   SCr trending back up, bump from 1.52>>1.72->1.87 overnight. SBPs 90-120s. Only 460 cc in UOP charted yesterday but pt reports issues w/ incontinence. Lasix was held yesterday sec to AKI. Wt up +2lb.   Continues w/ significant fluid overload but no dyspnea. Weeping LEE continues.  LFTs ok. Albumin low at 2.8.   Overall feels much better today. Pain improved w/ Toradol. No nausea today.   Objective:    Weight Range: 73.5 kg Body mass index is 27.81 kg/m.   Vital Signs:   Temp:  [97.5 F (36.4 C)-98.2 F (36.8 C)] 97.6 F (36.4 C) (08/21 0852) Pulse Rate:  [93-111] 106 (08/21 0852) Resp:  [18-20] 20 (08/21 0852) BP: (95-121)/(63-78) 121/68 (08/21 0852) SpO2:  [93 %-99 %] 98 % (08/21 0852) Last BM Date: 03/22/20  Weight change: Filed Weights   03/21/20 0557 03/22/20 0500 03/23/20 0500  Weight: 76.6 kg 72.4 kg 73.5 kg   Intake/Output:   Intake/Output Summary (Last 24 hours) at 03/24/2020 0942 Last data filed at 03/24/2020 0854 Gross per 24 hour  Intake 240 ml  Output 700 ml  Net -460 ml    Physical Exam    General: chronically ill appearing, sitting up on side of bed, no resp difficulty HEENT: normal Neck: supple. JVP elevated to jaw . Carotids 2+ bilat; no bruits. No lymphadenopathy or thryomegaly appreciated. Cor: PMI nondisplaced. Irregularly irregular rate & rhythm, tachy. No rubs, gallops or murmurs. Lungs: clear Abdomen: soft, nontender, nondistended. No hepatosplenomegaly. No bruits  or masses. Good bowel sounds. Extremities: no cyanosis, clubbing, rash, bloody scratch marks, 2+ bilateral LEE up to knees both legs wrapped (wheeping edema) Neuro: alert & orientedx3, cranial nerves grossly intact. moves all 4 extremities w/o difficulty. Affect pleasant  Telemetry   A fib 90-110s   EKG    No new EKG to review   Labs    CBC Recent Labs    03/22/20 0954  WBC 12.5*  HGB 11.3*  HCT 37.7  MCV 88.1  PLT 836   Basic Metabolic Panel Recent Labs    03/23/20 1127 03/23/20 1505 03/23/20 2316 03/24/20 0714  NA 132*  --   --  133*  K 5.3*   < > 5.6* 5.0  CL 98  --   --  97*  CO2 20*  --   --  22  GLUCOSE 110*  --   --  106*  BUN 53*  --   --  53*  CREATININE 1.80*  --   --  1.87*  CALCIUM 6.6*  --   --  6.8*   < > = values in this interval not displayed.   Liver Function Tests Recent Labs    03/23/20 0245  AST 36  ALT 23  ALKPHOS 126  BILITOT 2.1*  PROT 6.6  ALBUMIN 2.8*   No results for input(s): LIPASE, AMYLASE in the last 72 hours. Cardiac Enzymes No results for input(s): CKTOTAL, CKMB, CKMBINDEX, TROPONINI in the last 72 hours.  BNP: BNP (  last 3 results) Recent Labs    12/04/19 1324 03/18/20 0229  BNP 581.1* 912.0*    ProBNP (last 3 results) No results for input(s): PROBNP in the last 8760 hours.   D-Dimer No results for input(s): DDIMER in the last 72 hours. Hemoglobin A1C No results for input(s): HGBA1C in the last 72 hours. Fasting Lipid Panel No results for input(s): CHOL, HDL, LDLCALC, TRIG, CHOLHDL, LDLDIRECT in the last 72 hours. Thyroid Function Tests No results for input(s): TSH, T4TOTAL, T3FREE, THYROIDAB in the last 72 hours.  Invalid input(s): FREET3  Other results:   Imaging    No results found.   Medications:     Scheduled Medications: . amiodarone  200 mg Oral Daily  . apixaban  5 mg Oral BID  . exemestane  25 mg Oral QPC breakfast  . furosemide  80 mg Intravenous Daily  . insulin aspart  0-15  Units Subcutaneous TID AC & HS  . mirtazapine  7.5 mg Oral QHS  . morphine  15 mg Oral Q12H  . senna  2 tablet Oral BID    Infusions: . sodium chloride Stopped (03/22/20 1658)  . cefTRIAXone (ROCEPHIN)  IV 2 g (03/23/20 1508)  . ondansetron (ZOFRAN) IV      PRN Medications: sodium chloride, acetaminophen **OR** acetaminophen, HYDROmorphone (DILAUDID) injection, morphine, ondansetron **OR** ondansetron (ZOFRAN) IV, polyethylene glycol, promethazine, sodium chloride   Assessment/Plan   1. Atrial Fibrillation with RVR - S/p MAZE and LAA clipping at time of CABG in 11/2016. S/p DCCV in 09/2015 and more recently 12/09/2019. Noted to be back in atrial fibrillation at last office visit on 01/30/2020 and started on low dose PO Amiodarone. - Presented in atrial fibrillation with RVR in the setting of sepsis secondary to cellulitis of bilateral lower extremities. Difficult situation complicated by patient's intolerance to multiple cardiac meds. V-rates slightly improved, currently in the 90s to 110s. - Not been able to tolerate IV Amiodarone due to severe nausea. - Not able to tolerate beta-blocker due to hypotension with syncope. - Calcium channel blockers not a good option with reduced EF. - Digoxin not a good option with current AKI.  - continue to treat w/ low dose PO amio, 200 mg daily + treatment of acute infection  - Continue Eliquis 5mg  twice daily.    2. Acute on Chronic Combined CHF - History of ischemic cardiomyopathy. PYP in the past negative for amyloid. - Most recent Echo in 12/2019 showed LVEF of 35-40% with severe asymmetric LVH, mild MR, moderate TR, and severely elevated PASP. - Chest x-ray showed cardiomegaly with mildly enlarged central pulmonary vessel but no overt edema. BNP 912 - Home Lasix held on admit and patient actually given some IV fluids on admission due to sepsis/ AKI. Diuretics restarted 8/17. Remains volume overloaded.  - lasix held yesterday, give dose of Lasix 80  mg IV once daily today and re-evaluate Crea tomorrow - intolerant to Losartan due to hyperkalemia   3. CAD  - S/p CABG x4 in 11/2016.  - High-sensitivity troponin minimally elevated and flat at 22 >> 24. Not consistent with ACS. Suspect demand ischemia in setting of sepsis and atrial fibrillation with RVR. - She reports constant left sided flank/breast pain that is worse with inspiration for the last several months. Sounds like this may be secondary to radiation. Nothing that sounds like angina. - No Aspirin with need for Eliquis.  - Continue statin.  4. 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 Had VF  arrest in 11/2016 with appropriate ICD shock.  - No recurrent VT/VF.  - Had not been able to tolerate Amiodarone or beta-blocker in the past.  5. Sepsis Secondary to Bilateral Lower Extremity Cellulitis - initially placed on broad spectrum abx w/ vanc + meropenum. AF. Lactic acid normalized.  Blood cultures NGTD  - abx have been narrowed to Rocephin   6. 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 - Diagnosed in 12/2019. Lynch syndrome diagnosed by genetic study.  - Follows with Dr. Marin Olp as outpatient. On immunotherapy   7. Hyperkalemia - K 6.6 this am, treated w/ Lokelma + IV Lasix  - repeat K 5.0   8. Type 2 Diabetes Mellitus - Management per primary team.  9. Acute on Chronic Kidney Disease - baseline SCr 1.0-1.30 range  - Creatinine 2.20 on admission, sepsis and deceased PO intake with constant nausea and frequent vomiting -overall improved but trending back up, 1.52>>1.72->1.87 - continue to monitor w/ diuresis - monitor w/ Toradol   Length of Stay: 6  Ena Dawley, MD  03/24/2020, 9:42 AM   Advanced Heart Failure Team Pager [phone] (M-F; 7a - 4p)  Please contact [facility] for night-coverage after hours (4p -7a ) and weekends on [email or web address]

## 2020-03-25 DIAGNOSIS — C50911 Malignant neoplasm of unspecified site of right female breast: Secondary | ICD-10-CM

## 2020-03-25 LAB — BASIC METABOLIC PANEL
Anion gap: 12 (ref 5–15)
BUN: 53 mg/dL — ABNORMAL HIGH (ref 8–23)
CO2: 21 mmol/L — ABNORMAL LOW (ref 22–32)
Calcium: 6.2 mg/dL — CL (ref 8.9–10.3)
Chloride: 99 mmol/L (ref 98–111)
Creatinine, Ser: 1.54 mg/dL — ABNORMAL HIGH (ref 0.44–1.00)
GFR calc Af Amer: 39 mL/min — ABNORMAL LOW (ref >60)
GFR calc non Af Amer: 33 mL/min — ABNORMAL LOW (ref >60)
Glucose, Bld: 79 mg/dL (ref 70–99)
Potassium: 4.7 mmol/L (ref 3.5–5.1)
Sodium: 132 mmol/L — ABNORMAL LOW (ref 135–145)

## 2020-03-25 LAB — HEPATIC FUNCTION PANEL
ALT: 24 U/L (ref 0–44)
AST: 28 U/L (ref 15–41)
Albumin: 2.6 g/dL — ABNORMAL LOW (ref 3.5–5.0)
Alkaline Phosphatase: 105 U/L (ref 38–126)
Bilirubin, Direct: 0.5 mg/dL — ABNORMAL HIGH (ref 0.0–0.2)
Indirect Bilirubin: 0.5 mg/dL (ref 0.3–0.9)
Total Bilirubin: 1 mg/dL (ref 0.3–1.2)
Total Protein: 6.4 g/dL — ABNORMAL LOW (ref 6.5–8.1)

## 2020-03-25 LAB — GLUCOSE, CAPILLARY
Glucose-Capillary: 78 mg/dL (ref 70–99)
Glucose-Capillary: 149 mg/dL — ABNORMAL HIGH (ref 70–99)
Glucose-Capillary: 113 mg/dL — ABNORMAL HIGH (ref 70–99)
Glucose-Capillary: 91 mg/dL (ref 70–99)

## 2020-03-25 LAB — CBC
HCT: 39.3 % (ref 36.0–46.0)
Hemoglobin: 11.5 g/dL — ABNORMAL LOW (ref 12.0–15.0)
MCH: 27.1 pg (ref 26.0–34.0)
MCHC: 29.3 g/dL — ABNORMAL LOW (ref 30.0–36.0)
MCV: 92.7 fL (ref 80.0–100.0)
Platelets: 252 10*3/uL (ref 150–400)
RBC: 4.24 MIL/uL (ref 3.87–5.11)
RDW: 23.5 % — ABNORMAL HIGH (ref 11.5–15.5)
WBC: 11.4 10*3/uL — ABNORMAL HIGH (ref 4.0–10.5)
nRBC: 0 % (ref 0.0–0.2)

## 2020-03-25 LAB — POTASSIUM
Potassium: 4.9 mmol/L (ref 3.5–5.1)
Potassium: 5 mmol/L (ref 3.5–5.1)
Potassium: 6 mmol/L — ABNORMAL HIGH (ref 3.5–5.1)
Potassium: 5.3 mmol/L — ABNORMAL HIGH (ref 3.5–5.1)
Potassium: 4.8 mmol/L (ref 3.5–5.1)

## 2020-03-25 LAB — VITAMIN D 25 HYDROXY (VIT D DEFICIENCY, FRACTURES): Vit D, 25-Hydroxy: 10.86 ng/mL — ABNORMAL LOW (ref 30–100)

## 2020-03-25 LAB — MAGNESIUM: Magnesium: 2.2 mg/dL (ref 1.7–2.4)

## 2020-03-25 LAB — PHOSPHORUS: Phosphorus: 3.3 mg/dL (ref 2.5–4.6)

## 2020-03-25 NOTE — Progress Notes (Signed)
TRIAD [name]  PROGRESS NOTE  TECIA CINNAMON FMB:[id number] DOB: 1946/02/09 DOA: 03/16/2020 PCP: [name], No Pcp Per Admit date - 03/06/2020   Admitting Physician Vernelle Emerald, MD  Outpatient Primary MD for the [name] is [name], No Pcp Per  LOS - 7 Brief Narrative   [name] is a 74 y.o. 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 fib status post ICD, breast cancer with bone mets/Lynch syndrome who presented on 8/14 with several days of shortness of breath, worsening redness of lower legs and weeping lesions for the past 2 weeks in setting of using Derm stone to scrape her dry skin and found to have A. fib with RVR to the 120s and sepsis physiology (white count of 24.5, lactic acid 2.2, Respiratory rate of 21) secondary to bilateral lower extremity cellulitis course complicated by A. fib with RVR, AKI on CKD with hyperkalemia    Subjective  Nausea worsened with amiodarone, rib cage pain better  A & P  Sepsis secondary to nonpurulent cellulitis of bilateral lower extremities.  Unfortunate consequence of exfoliativestone use to skin.  Remains afebrile, white count downtrending, blood cultures unremarkable, sepsis physiology resolved. (Vancomycin 8/14, 8/16 and Meropenem 8/15-8/17) -Continue IV ceftriaxone, to complete 7 day course,end date 8/22.   A. fib with RVR, likely exacerbated in setting of sepsis due to above, persists.  Heart rate remains in the 110s to 120s .  [name]'s been unable to tolerate IV amiodarone due to nausea in the past, CCB is not an option with decreased EF, hypotension to prior beta-blockade ([name] declines IV amiodarone or higher p.o. dosing) -Appreciate cardiology recommendations, continue low-dose p.o. amiodarone 200 mg daily -Hopeful continue treatment of infection will improve heart  rates  Acute on chronic, systolic/diastolic CHF.  Still quite volume overloaded on exam, some slight improvement in bilateral lower extremity swelling with 2-3+ pitting edema bilateral lower extremities.  In the setting of sepsis on admission and holding home diuretics.  BNP in the 900s on admission -Appreciate heart failure recommendations, continue IV Lasix 80 mg daily, monitor output and volume status and kidney function -Intolerant to losartan due to hyperkalemia  Slightly elevated high-sensitivity troponin CAD status post CABG (2018).  Suspect demand ischemia in setting of sepsis and A. fib with RVR Not having any anginal equivalents, does have chronic constant left-sided flank pain for several months related to radiation-continue home Eliquis and statin  Left-sided rib cage pain, found to have worsening pre-existing pathologic fractures in thoracic spine on CT chest (T5 and T8) and development of paraspinal soft tissue soft tissue lumbar spine concerning for worsening malignancy.  Pain control improved after IV Toradol -Continue pain control with Home MS Contin, as needed IV Dilaudid,  -Monitor BMP  History of VT/V. fib, status post ICD (2016), history of V. fib arrest in 11/2016 -Has not been able to tolerate amiodarone or beta blockade in the past -Currently on amiodarone 20 mg daily  Metastatic breast cancer with bone mets.  Diagnosed 12/2019. -Continue outpatient follow-up with Dr. Marin Olp, on immunotherapy.  Type 2 diabetes, A1c 7.2 -Holding home glipizide -Monitor CBG, sliding scale as needed  Acute on CKD with hyperkalemia ,  Peak creatinine of 2.2, baseline of ~1.6. Required lokelma on 8/20 for recurrent hyperkalemia, now improved at 5. But Creatinine continues to elevate to 1.87 despite diuresis holiday on  8/20, Could be related to recent NSAID use on 8/19 -Avoid nephrotoxins (had toradol x3 for back pain on 8/19) -Monitor BMP --Monitor output  Hypocalcemia. Even corrected  for hypoalbuminemia Ca is 7.2. Phos and Mg wnl.  --check PTH --Vit  (1,,25 and 25)     Family Communication  : None  Code Status : DNR, discussed on day of admission  Disposition Plan  :  [name] is from home. Anticipated d/c date: 2 to 3 days. Barriers to d/c or necessity for inpatient status:  Needs continued IV Lasix for volume status, , requiring close monitoring potassium and kidney function Consults  : Advanced heart failure, oncology  Procedures  : Venous duplex 8/18  DVT Prophylaxis  : Eliquis Lab Results  Component Value Date   PLT 252 03/25/2020    Diet :  Diet Order            Diet regular Room service appropriate? Yes; Fluid consistency: Thin  Diet effective now                  Inpatient Medications Scheduled Meds: . amiodarone  200 mg Oral Daily  . apixaban  5 mg Oral BID  . exemestane  25 mg Oral QPC breakfast  . furosemide  80 mg Intravenous Daily  . insulin aspart  0-15 Units Subcutaneous TID AC & HS  . mirtazapine  7.5 mg Oral QHS  . morphine  15 mg Oral Q12H  . senna  2 tablet Oral BID   Continuous Infusions: . sodium chloride Stopped (03/22/20 1658)  . ondansetron (ZOFRAN) IV     PRN Meds:.sodium chloride, acetaminophen **OR** acetaminophen, HYDROmorphone (DILAUDID) injection, morphine, ondansetron **OR** ondansetron (ZOFRAN) IV, polyethylene glycol, sodium chloride  Antibiotics  :   Anti-infectives (From admission, onward)   Start     Dose/Rate Route Frequency Ordered Stop   03/20/20 1430  cefTRIAXone (ROCEPHIN) 2 g in sodium chloride 0.9 % 100 mL IVPB        2 g 200 mL/hr over 30 Minutes Intravenous Every 24 hours 03/20/20 1345 03/25/20 1320   03/20/20 0600  vancomycin (VANCOREADY) IVPB 1250 mg/250 mL  Status:  Discontinued        1,250 mg 166.7 mL/hr over 90 Minutes Intravenous Every 48 hours 03/18/20 0230 03/20/20 1345   03/18/20 0330  vancomycin (VANCOREADY) IVPB 1500 mg/300 mL        1,500 mg 150 mL/hr over 120 Minutes Intravenous   Once 03/18/20 0230 03/18/20 0803   03/18/20 0330  meropenem (MERREM) 1 g in sodium chloride 0.9 % 100 mL IVPB  Status:  Discontinued        1 g 200 mL/hr over 30 Minutes Intravenous 2 times daily 03/18/20 0230 03/20/20 1345   03/18/20 0130  piperacillin-tazobactam (ZOSYN) IVPB 3.375 g  Status:  Discontinued        3.375 g 100 mL/hr over 30 Minutes Intravenous  Once 03/18/20 0115 03/18/20 0214       Objective   Vitals:   03/25/20 0516 03/25/20 0601 03/25/20 0822 03/25/20 1224  BP: 116/62  120/68 117/72  Pulse: 100  (!) 108 (!) 110  Resp: 18 19 18 18   Temp:   97.8 F (36.6 C) 97.9 F (36.6 C)  TempSrc:   Oral Oral  SpO2: 100%  98% 100%  Weight:  73.9 kg    Height:        SpO2: 100 % O2 Flow Rate (L/min): 0 L/min  Wt Readings from Last 3  Encounters:  03/25/20 73.9 kg  01/30/20 82.9 kg  12/28/19 82.9 kg     Intake/Output Summary (Last 24 hours) at 03/25/2020 1356 Last data filed at 03/25/2020 0900 Gross per 24 hour  Intake 490 ml  Output 1200 ml  Net -710 ml    Physical Exam:     Awake Alert, Oriented X 3, Normal affect No new F.N deficits,  [place].AT, Normal respiratory effort on room air, CTAB Irregularly irregular heart rhythm, tachycardic, 2-3+ pitting edema bilateral lower extremities to knee +ve B.Sounds, Abd Soft, No tenderness, No rebound, guarding or rigidity. Right lateral leg with large area of erythema and weeping lesions Left rib cage pain, reproducible with palpation     I have personally reviewed the following:   Data Reviewed:  CBC Recent Labs  Lab 03/19/20 0151 03/20/20 0248 03/21/20 0449 03/22/20 0954 03/25/20 1110  WBC 21.1* 18.1* 12.3* 12.5* 11.4*  HGB 11.2* 11.1* 11.3* 11.3* 11.5*  HCT 36.8 36.5 37.3 37.7 39.3  PLT 257 229 235 247 252  MCV 86.8 86.9 88.6 88.1 92.7  MCH 26.4 26.4 26.8 26.4 27.1  MCHC 30.4 30.4 30.3 30.0 29.3*  RDW 22.5* 22.9* 23.0* 23.2* 23.5*  LYMPHSABS 0.5* 0.5* 0.6*  --   --   MONOABS 0.8 0.8 0.7  --   --    EOSABS 0.2 0.3 0.7*  --   --   BASOSABS 0.0 0.0 0.1  --   --     Chemistries  Recent Labs  Lab 03/19/20 0151 03/20/20 0248 03/22/20 0954 03/22/20 0954 03/23/20 0245 03/23/20 0245 03/23/20 1127 03/23/20 1505 03/24/20 0714 03/24/20 1229 03/24/20 1936 03/24/20 2326 03/25/20 0312 03/25/20 0724 03/25/20 0816 03/25/20 1110  NA 132*   < > 131*  --  128*  --  132*  --  133*  --   --   --  132*  --   --   --   K 5.4*   < > 5.1   < > 6.6*   < > 5.3*   < > 5.0   < > 4.5 4.9 4.7 5.0  --  6.0*  CL 100   < > 99  --  99  --  98  --  97*  --   --   --  99  --   --   --   CO2 16*   < > 20*  --  17*  --  20*  --  22  --   --   --  21*  --   --   --   GLUCOSE 67*   < > 99  --  87  --  110*  --  106*  --   --   --  79  --   --   --   BUN 53*   < > 50*  --  52*  --  53*  --  53*  --   --   --  53*  --   --   --   CREATININE 1.90*   < > 1.52*  --  1.72*  --  1.80*  --  1.87*  --   --   --  1.54*  --   --   --   CALCIUM 6.2*   < > 6.6*  --  6.3*  --  6.6*  --  6.8*  --   --   --  6.2*  --   --   --   MG  2.5*  --   --   --   --   --   --   --   --   --   --   --   --   --  2.2  --   AST  --   --   --   --  36  --   --   --   --   --   --   --   --   --  28  --   ALT  --   --   --   --  23  --   --   --   --   --   --   --   --   --  24  --   ALKPHOS  --   --   --   --  126  --   --   --   --   --   --   --   --   --  105  --   BILITOT  --   --   --   --  2.1*  --   --   --   --   --   --   --   --   --  1.0  --    < > = values in this interval not displayed.   ------------------------------------------------------------------------------------------------------------------ No results for input(s): CHOL, HDL, LDLCALC, TRIG, CHOLHDL, LDLDIRECT in the last 72 hours.  Lab Results  Component Value Date   HGBA1C 7.2 (H) 03/18/2020   ------------------------------------------------------------------------------------------------------------------ No results for input(s): TSH, T4TOTAL, T3FREE,  THYROIDAB in the last 72 hours.  Invalid input(s): FREET3 ------------------------------------------------------------------------------------------------------------------ No results for input(s): VITAMINB12, FOLATE, FERRITIN, TIBC, IRON, RETICCTPCT in the last 72 hours.  Coagulation profile No results for input(s): INR, PROTIME in the last 168 hours.  No results for input(s): DDIMER in the last 72 hours.  Cardiac Enzymes No results for input(s): CKMB, TROPONINI, MYOGLOBIN in the last 168 hours.  Invalid input(s): CK ------------------------------------------------------------------------------------------------------------------    Component Value Date/Time   BNP 912.0 (H) 03/18/2020 0229   BNP 398.6 (H) 09/13/2015 1158    Micro Results Recent Results (from the past 240 hour(s))  Culture, blood (routine x 2)     Status: None   Collection Time: 03/18/20  2:25 AM   Specimen: BLOOD  Result Value Ref Range Status   Specimen Description BLOOD RIGHT HAND  Final   Special Requests   Final    BOTTLES DRAWN AEROBIC AND ANAEROBIC Blood Culture results may not be optimal due to an excessive volume of blood received in culture bottles   Culture   Final    NO GROWTH 5 DAYS Performed at [facility], [place] [address] [place], [place] [address]    Report Status 03/23/2020 FINAL  Final  SARS Coronavirus 2 by RT PCR (hospital order, performed in [facility] hospital lab) Nasopharyngeal Nasopharyngeal Swab     Status: None   Collection Time: 03/18/20  2:27 AM   Specimen: Nasopharyngeal Swab  Result Value Ref Range Status   SARS Coronavirus 2 NEGATIVE NEGATIVE Final    Comment: (NOTE) SARS-CoV-2 target nucleic acids are NOT DETECTED.  The SARS-CoV-2 RNA is generally detectable in upper and lower respiratory specimens during the acute phase of infection. The lowest concentration of SARS-CoV-2 viral copies this assay can detect is 250 copies / mL. A negative result does not preclude  SARS-CoV-2 infection and should  not be used as the sole basis for treatment or other [name] management decisions.  A negative result may occur with improper specimen collection / handling, submission of specimen other than nasopharyngeal swab, presence of viral mutation(s) within the areas targeted by this assay, and inadequate number of viral copies (<250 copies / mL). A negative result must be combined with clinical observations, [name] history, and epidemiological information.  Fact Sheet for Patients:   StrictlyIdeas.no  Fact Sheet for Healthcare Providers: BankingDealers.co.za  This test is not yet approved or  cleared by the [place] FDA and has been authorized for detection and/or diagnosis of SARS-CoV-2 by FDA under an Emergency Use Authorization (EUA).  This EUA will remain in effect (meaning this test can be used) for the duration of the COVID-19 declaration under Section 564(b)(1) of the Act, 21 U.S.C. section 360bbb-3(b)(1), unless the authorization is terminated or revoked sooner.  Performed at [facility], [place] [address] [place], [place] [address]   Culture, blood (routine x 2)     Status: None   Collection Time: 03/18/20  2:29 AM   Specimen: BLOOD  Result Value Ref Range Status   Specimen Description BLOOD RIGHT ARM  Final   Special Requests   Final    BOTTLES DRAWN AEROBIC ONLY Blood Culture adequate volume   Culture   Final    NO GROWTH 5 DAYS Performed at [facility], [address], [place], [place] [address]    Report Status 03/23/2020 FINAL  Final  MRSA PCR Screening     Status: None   Collection Time: 03/18/20  6:13 AM   Specimen: Nasopharyngeal  Result Value Ref Range Status   MRSA by PCR NEGATIVE NEGATIVE Final    Comment:        The GeneXpert MRSA Assay (FDA approved for NASAL specimens only), is one component of a comprehensive MRSA colonization surveillance program. It is  not intended to diagnose MRSA infection nor to guide or monitor treatment for MRSA infections. Performed at [facility], [place] [address], [place], [place] [address]     Radiology Reports DG Chest 2 View  Result Date: 03/07/2020 CLINICAL DATA:  Chest pain EXAM: CHEST - 2 VIEW COMPARISON:  12/04/2019, PET CT 12/22/2019 FINDINGS: Post sternotomy changes with left atrial appendage clip. Left-sided pacing device as before. Cardiomegaly with mildly enlarged central pulmonary vessels. No overt edema. No consolidation, pleural effusion, or pneumothorax. Moderate compression deformity of mid to lower thoracic vertebra, new or increased as compared to chest CT from May 2021. IMPRESSION: Cardiomegaly without overt failure. New mild to moderate compression deformity of mid to lower thoracic vertebra. Electronically Signed   By: Donavan Foil M.D.   On: 03/18/2020 20:18   CT CHEST WO CONTRAST  Addendum Date: 03/22/2020   ADDENDUM REPORT: 03/22/2020 09:53 ADDENDUM: Spine fractures with 2 column involvement at T5 and T8 were discussed along with worsening spinal alignment and paraspinal soft tissue along the upper thoracic spine were discussed with the provider is outlined below. MRI could be considered if cardiac device will allow. These results were called by telephone at the time of interpretation on 03/22/2020 at 9:53 am to provider [facility] , who verbally acknowledged these results. Electronically Signed   By: Zetta Bills M.D.   On: 03/22/2020 09:53   Result Date: 03/22/2020 CLINICAL DATA:  Breast cancer, surveillance of metastatic disease, significant pain reported to LEFT lateral chest. EXAM: CT CHEST WITHOUT CONTRAST TECHNIQUE: Multidetector CT imaging  of the chest was performed following the standard protocol without IV contrast. COMPARISON:  Dec 04, 2019 and PET evaluation of Dec 22, 2019 FINDINGS: Cardiovascular: Calcified atheromatous plaque of the thoracic aorta. No aneurysmal dilation.  Post median sternotomy for CABG with unchanged cardiac enlargement. Mitral annular calcification and cardiac pacer defibrillator and LEFT atrial clipping as before. Engorgement of central pulmonary vasculature with similar appearance. Limited assessment of vascular structures in the chest due to lack of intravenous contrast. Mediastinum/Nodes: No adenopathy at the thoracic inlet. No adenopathy in the axilla. No mediastinal lymphadenopathy. Esophagus mildly patulous. Lungs/Pleura: No pleural effusion. Interstitial thickening at the lung apices is persistent and somewhat irregular. For instance on image 29 of series 4 there is more profound interstitial thickening with a bandlike appearance changes are seen in the medial chest with similar appearance. In the RIGHT upper lobe a bandlike area extends from the hilum into the RIGHT upper lobe. Some scarring along the RIGHT heart border with mild bronchial dilation with slight increase since the previous study. No discrete mass. Upper Abdomen: Incidental imaging of upper abdominal contents show no acute process to the extent evaluated. Adrenal glands are normal. Musculoskeletal: Spinous process fracture incidentally noted at C7, incompletely imaged. Slight interval decrease in height of the T1 vertebral body anteriorly compatible with mild compression approximately 10%. This was likely present previously. Interval development of pathologic fractures of the thoracic spine since previous imaging with worsening soft tissue about the upper thoracic spine at the level of T2, T3 and T4 with pathologic fractures at all these levels. T2 with minimal loss of height and T3 and T4 with approximately 20% loss of height. Also with approximately 50% loss of height at the T5 level worse on the LEFT with resultant dextroconvexity of the spine at this location that was mild previously and is now moderate in terms of curvature. The compression fractures present at T4-T5 on the prior study  have worsened in the interval. Interval approximately 40-50% loss of height of the T8 vertebral body. Fracture at this level and at the T5 level displaying signs of posterior cortical involvement of the vertebral body. Only very minimal retropulsion is noted at these levels. Interval 20% loss of height of the L1 vertebral body since the prior study. Diffuse heterogeneity of visualized bones in this [name] with known widespread bony metastases. LEFT breast mass may be slightly smaller than on the previous study measuring approximately 3.6 x 1.3 cm. Greatest thickness on the prior study was approximately 2.4 cm. IMPRESSION: 1. Worsening of pre-existing pathologic fractures in the thoracic spine with new T8 compression and new L1 compression as described. There is mild posterior cortical retropulsion at the T5 and T8 levels. 2. Development of soft tissue along lateral margin of the spine likely related to worsening malignancy. MRI of the spine could be helpful as clinically warranted for further assessment. 3. Septal thickening at the lung apices could be a chronic finding but could also be seen in the setting of lymphangitic carcinomatosis. Attention on follow-up. 4. Breast mass may be slightly smaller than on the previous study. 5. Similar appearance of central pulmonary arterial enlargement likely related to underlying pulmonary arterial hypertension. 6. Signs of cardiac disease as before and changes of median sternotomy for CABG. A call is out to the referring provider to further discuss findings in the above case. Aortic Atherosclerosis (ICD10-I70.0). Electronically Signed: By: Zetta Bills M.D. On: 03/22/2020 09:42   VAS [place] LOWER EXTREMITY VENOUS (DVT)  Result Date: 03/20/2020  Lower Venous DVTStudy Indications: Edema.  Risk Factors: Cancer Breast. Limitations: Bandages and body habitus. Comparison Study: Performing Technologist: Antonieta Pert RDMS, RVT  Examination Guidelines: A complete evaluation  includes B-mode imaging, spectral Doppler, color Doppler, and power Doppler as needed of all accessible portions of each vessel. Bilateral testing is considered an integral part of a complete examination. Limited examinations for reoccurring indications may be performed as noted. The reflux portion of the exam is performed with the [name] in reverse Trendelenburg.  +---------+---------------+---------+-----------+----------+--------------+ RIGHT    CompressibilityPhasicitySpontaneityPropertiesThrombus Aging +---------+---------------+---------+-----------+----------+--------------+ CFV      Full           Yes      Yes                                 +---------+---------------+---------+-----------+----------+--------------+ SFJ      Full                                                        +---------+---------------+---------+-----------+----------+--------------+ FV Prox  Full                                                        +---------+---------------+---------+-----------+----------+--------------+ FV Mid   Full                                                        +---------+---------------+---------+-----------+----------+--------------+ FV DistalFull                                                        +---------+---------------+---------+-----------+----------+--------------+ PFV      Full                                                        +---------+---------------+---------+-----------+----------+--------------+ POP      Full           Yes      Yes                                 +---------+---------------+---------+-----------+----------+--------------+ PTV                                                   Not visualized +---------+---------------+---------+-----------+----------+--------------+ PERO  Not visualized  +---------+---------------+---------+-----------+----------+--------------+ GSV      Full                                                        +---------+---------------+---------+-----------+----------+--------------+   +---------+---------------+---------+-----------+----------+--------------+ LEFT     CompressibilityPhasicitySpontaneityPropertiesThrombus Aging +---------+---------------+---------+-----------+----------+--------------+ CFV      Full                                                        +---------+---------------+---------+-----------+----------+--------------+ SFJ      Full                                                        +---------+---------------+---------+-----------+----------+--------------+ FV Prox  Full                                                        +---------+---------------+---------+-----------+----------+--------------+ FV Mid   Full                                                        +---------+---------------+---------+-----------+----------+--------------+ FV DistalFull                                                        +---------+---------------+---------+-----------+----------+--------------+ PFV      Full                                                        +---------+---------------+---------+-----------+----------+--------------+ POP      Full                                                        +---------+---------------+---------+-----------+----------+--------------+ PTV                                                   Not visualized +---------+---------------+---------+-----------+----------+--------------+ PERO  Not visualized +---------+---------------+---------+-----------+----------+--------------+ GSV      Full                                                         +---------+---------------+---------+-----------+----------+--------------+ hypoechoic fluid collection posterior left popliteal fossa 3.9cm    Summary: RIGHT: - There is no evidence of deep vein thrombosis in the lower extremity. However, portions of this examination were limited- see technologist comments above.  - No cystic structure found in the popliteal fossa. - calf veins not visulaized due to bandages  LEFT: - There is no evidence of deep vein thrombosis in the lower extremity. However, portions of this examination were limited- see technologist comments above.  - A cystic structure is found in the popliteal fossa. - calf veins not visulaized due to bandages  *See table(s) above for measurements and observations. Electronically signed by Deitra Mayo MD on 03/20/2020 at 5:10:34 PM.    Final      Time Spent in minutes  30     Desiree Hane M.D on 03/25/2020 at 1:56 PM  To page go to [email or web address] - password [facility]

## 2020-03-25 NOTE — TOC Progression Note (Signed)
Transition of Care [facility]) - Progression Note    Patient Details  Name: [name] MRN: [id number] Date of Birth: 1945/09/22  Transition of Care [facility]) CM/SW [place], [place] Phone Number: 03/25/2020, 12:45 PM  Clinical Narrative:     CSW visit with patient at bedside. CSW introduced self and explained role. CSW discussed with patient PT recommendations. Patient declined short term rehab at [facility]. CSW inquired about [facility] services. Patient declined HH/PT, patient states she does not want it.   Clinical Social Worker will sign off for now as social work intervention is no longer needed. Please consult [place] if new need arises.    Thurmond Butts, MSW, LCSWA Clinical Social Worker   Expected Discharge Plan: [place] Barriers to Discharge: Continued Medical Work up  Expected Discharge Plan and Services Expected Discharge Plan: [place] In-house Referral: Hospice / Palliative Care Discharge Planning Services: CM Consult   Living arrangements for the past 2 months: Single Family Home                                       Social Determinants of Health (SDOH) Interventions    Readmission Risk Interventions Readmission Risk Prevention Plan 12/08/2019  Transportation Screening Complete  HRI or Home Care Consult Complete  Social Work Consult for [place] Planning/Counseling Complete  Palliative Care Screening Complete  Medication Review Press photographer) Complete  Some recent data might be hidden

## 2020-03-25 NOTE — Progress Notes (Signed)
Cardiology Rounding Note  PCP-Cardiologist: No primary care provider on file.   Patient Profile   74 y/o woman with recently diagnosed metastatic breast CA (undergoing XRT), CAD s/p CABG, DM2, systolic HF with EF 78-24% and PAF.   Admitted with sepsis and lactic acidosis due to severe LE cellulitis in setting of marked volume overload. Course complicated by AF with RVR.  Subjective:     K 6.6 --> 5.0 today. Received dose of Lokelma. Persistent afib, rates 90-120s.   SCr trending back up, bump from 1.52>>1.72->1.87-->1.54, overnight. SBPs 102-120s. Only 950 cc in UOP charted yesterday but pt reports issues w/ incontinence. Lasix was held on 8/20 sec to AKI. Lasix 80 mg iv daily restarted yesterday with good response in symptoms - improved SOB and mild decrease in LE edema.  Continues w/ fluid overload but no dyspnea. Weeping LEE continues.  LFTs ok. Albumin low at 2.8.   Overall feels much better today. Pain improved w/ Toradol. No nausea today.   Objective:    Weight Range: 73.9 kg Body mass index is 27.97 kg/m.   Vital Signs:   Temp:  [87.7 F (30.9 C)-97.9 F (36.6 C)] 97.8 F (36.6 C) (08/22 0822) Pulse Rate:  [76-108] 108 (08/22 0822) Resp:  [12-20] 18 (08/22 0822) BP: (102-120)/(62-91) 120/68 (08/22 0822) SpO2:  [94 %-100 %] 98 % (08/22 0822) Weight:  [73.9 kg] 73.9 kg (08/22 0601) Last BM Date: 03/22/20  Weight change: Filed Weights   03/22/20 0500 03/23/20 0500 03/25/20 0601  Weight: 72.4 kg 73.5 kg 73.9 kg   Intake/Output:   Intake/Output Summary (Last 24 hours) at 03/25/2020 0901 Last data filed at 03/25/2020 0600 Gross per 24 hour  Intake 250 ml  Output 1200 ml  Net -950 ml    Physical Exam    General: chronically ill appearing, sitting up on side of bed, no resp difficulty HEENT: normal Neck: supple. JVP elevated to jaw . Carotids 2+ bilat; no bruits. No lymphadenopathy or thryomegaly appreciated. Cor: PMI nondisplaced. Irregularly irregular  rate & rhythm, tachy. No rubs, gallops or murmurs. Lungs: clear Abdomen: soft, nontender, nondistended. No hepatosplenomegaly. No bruits or masses. Good bowel sounds. Extremities: no cyanosis, clubbing, rash, bloody scratch marks, 2+ bilateral LEE up to knees both legs wrapped (wheeping edema) Neuro: alert & orientedx3, cranial nerves grossly intact. moves all 4 extremities w/o difficulty. Affect pleasant  Telemetry   A fib 90-110s   EKG    No new EKG to review   Labs    CBC Recent Labs    03/22/20 0954  WBC 12.5*  HGB 11.3*  HCT 37.7  MCV 88.1  PLT 235   Basic Metabolic Panel Recent Labs    03/24/20 0714 03/24/20 1229 03/25/20 0312 03/25/20 0724  NA 133*  --  132*  --   K 5.0   < > 4.7 5.0  CL 97*  --  99  --   CO2 22  --  21*  --   GLUCOSE 106*  --  79  --   BUN 53*  --  53*  --   CREATININE 1.87*  --  1.54*  --   CALCIUM 6.8*  --  6.2*  --    < > = values in this interval not displayed.   Liver Function Tests Recent Labs    03/23/20 0245  AST 36  ALT 23  ALKPHOS 126  BILITOT 2.1*  PROT 6.6  ALBUMIN 2.8*   No results for input(s): LIPASE,  AMYLASE in the last 72 hours. Cardiac Enzymes No results for input(s): CKTOTAL, CKMB, CKMBINDEX, TROPONINI in the last 72 hours.  BNP: BNP (last 3 results) Recent Labs    12/04/19 1324 03/18/20 0229  BNP 581.1* 912.0*    ProBNP (last 3 results) No results for input(s): PROBNP in the last 8760 hours.   D-Dimer No results for input(s): DDIMER in the last 72 hours. Hemoglobin A1C No results for input(s): HGBA1C in the last 72 hours. Fasting Lipid Panel No results for input(s): CHOL, HDL, LDLCALC, TRIG, CHOLHDL, LDLDIRECT in the last 72 hours. Thyroid Function Tests No results for input(s): TSH, T4TOTAL, T3FREE, THYROIDAB in the last 72 hours.  Invalid input(s): FREET3  Other results:   Imaging    No results found.   Medications:     Scheduled Medications: . amiodarone  200 mg Oral Daily    . apixaban  5 mg Oral BID  . exemestane  25 mg Oral QPC breakfast  . furosemide  80 mg Intravenous Daily  . insulin aspart  0-15 Units Subcutaneous TID AC & HS  . mirtazapine  7.5 mg Oral QHS  . morphine  15 mg Oral Q12H  . senna  2 tablet Oral BID    Infusions: . sodium chloride Stopped (03/22/20 1658)  . cefTRIAXone (ROCEPHIN)  IV 2 g (03/24/20 1519)  . ondansetron (ZOFRAN) IV      PRN Medications: sodium chloride, acetaminophen **OR** acetaminophen, HYDROmorphone (DILAUDID) injection, morphine, ondansetron **OR** ondansetron (ZOFRAN) IV, polyethylene glycol, promethazine, sodium chloride   Assessment/Plan   1. Atrial Fibrillation with RVR - S/p MAZE and LAA clipping at time of CABG in 11/2016. S/p DCCV in 09/2015 and more recently 12/09/2019. Noted to be back in atrial fibrillation at last office visit on 01/30/2020 and started on low dose PO Amiodarone. - Presented in atrial fibrillation with RVR in the setting of sepsis secondary to cellulitis of bilateral lower extremities. Difficult situation complicated by patient's intolerance to multiple cardiac meds. V-rates slightly improved, currently in the 90s to 110s. - Not been able to tolerate IV Amiodarone due to severe nausea. - Not able to tolerate beta-blocker due to hypotension with syncope. - Calcium channel blockers not a good option with reduced EF. - Digoxin not a good option with current AKI.  - continue to treat w/ low dose PO amio, 200 mg daily + treatment of acute infection  - Continue Eliquis 5mg  twice daily. Hb on 8/19 11.3.  2. Acute on Chronic Combined CHF - History of ischemic cardiomyopathy. PYP in the past negative for amyloid. - Most recent Echo in 12/2019 showed LVEF of 35-40% with severe asymmetric LVH, mild MR, moderate TR, and severely elevated PASP. - Chest x-ray showed cardiomegaly with mildly enlarged central pulmonary vessel but no overt edema. BNP 912 - Home Lasix held on admit and patient actually given  some IV fluids on admission due to sepsis/ AKI. Diuretics restarted 8/17. Remains volume overloaded.  - lasix held on 8/20, we will continue Lasix 80 mg IV once daily today, crea has improved since yesterday 1.87->1.54- intolerant to Losartan due to hyperkalemia  - LE wrapping in place, she is advised to perform ankle pumps to mobilize subq fluids, she is not excited about walking  3. CAD  - S/p CABG x4 in 11/2016.  - High-sensitivity troponin minimally elevated and flat at 22 >> 24. Not consistent with ACS. Suspect demand ischemia in setting of sepsis and atrial fibrillation with RVR. - She reports constant left  sided flank/breast pain that is worse with inspiration for the last several months. Sounds like this may be secondary to radiation. Nothing that sounds like angina. - No Aspirin with need for Eliquis.  - Continue statin.  4. 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 Had VF arrest in 11/2016 with appropriate ICD shock.  - No recurrent VT/VF.  - Had not been able to tolerate Amiodarone or beta-blocker in the past.  5. Sepsis Secondary to Bilateral Lower Extremity Cellulitis - initially placed on broad spectrum abx w/ vanc + meropenum. AF. Lactic acid normalized.  Blood cultures NGTD  - abx have been narrowed to Rocephin   6. 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 - Diagnosed in 12/2019. Lynch syndrome diagnosed by genetic study.  - Follows with Dr. Marin Olp as outpatient. On immunotherapy   7. Hyperkalemia - K 6.6 this am, treated w/ Lokelma + IV Lasix  - repeat K 5.0   8. Type 2 Diabetes Mellitus - Management per primary team.  9. Acute on Chronic Kidney Disease - baseline SCr 1.0-1.30 range  - Creatinine 2.20 on admission, sepsis and deceased PO intake with constant nausea and frequent vomiting -overall improved but trending back up, 1.52>>1.72->1.87->1.54 - continue to monitor w/ diuresis - monitor w/ Toradol   Length of Stay: 7  Ena Dawley, MD  03/25/2020, 9:01 AM   Advanced Heart Failure Team Pager [phone] (M-F; 7a - 4p)  Please contact [facility] for night-coverage after hours (4p -7a ) and weekends on [email or web address]

## 2020-03-26 ENCOUNTER — Ambulatory Visit
Admission: RE | Admit: 2020-03-26 | Discharge: 2020-03-26 | Disposition: A | Discharge status: Home / Self Care | Payer: PPO | Source: Ambulatory Visit | Attending: Radiation Oncology | Admitting: Radiation Oncology

## 2020-03-26 LAB — COMPREHENSIVE METABOLIC PANEL
ALT: 23 U/L (ref 0–44)
AST: 27 U/L (ref 15–41)
Albumin: 2.6 g/dL — ABNORMAL LOW (ref 3.5–5.0)
Alkaline Phosphatase: 98 U/L (ref 38–126)
Anion gap: 12 (ref 5–15)
BUN: 48 mg/dL — ABNORMAL HIGH (ref 8–23)
CO2: 22 mmol/L (ref 22–32)
Calcium: 6.5 mg/dL — ABNORMAL LOW (ref 8.9–10.3)
Chloride: 102 mmol/L (ref 98–111)
Creatinine, Ser: 1.44 mg/dL — ABNORMAL HIGH (ref 0.44–1.00)
GFR calc Af Amer: 42 mL/min — ABNORMAL LOW (ref >60)
GFR calc non Af Amer: 36 mL/min — ABNORMAL LOW (ref >60)
Glucose, Bld: 79 mg/dL (ref 70–99)
Potassium: 5.1 mmol/L (ref 3.5–5.1)
Sodium: 136 mmol/L (ref 135–145)
Total Bilirubin: 1 mg/dL (ref 0.3–1.2)
Total Protein: 6.1 g/dL — ABNORMAL LOW (ref 6.5–8.1)

## 2020-03-26 LAB — CBC WITH DIFFERENTIAL/PLATELET
Abs Immature Granulocytes: 0.1 10*3/uL — ABNORMAL HIGH (ref 0.00–0.07)
Basophils Absolute: 0.1 10*3/uL (ref 0.0–0.1)
Basophils Relative: 1 %
Eosinophils Absolute: 0.3 10*3/uL (ref 0.0–0.5)
Eosinophils Relative: 3 %
HCT: 36.5 % (ref 36.0–46.0)
Hemoglobin: 10.9 g/dL — ABNORMAL LOW (ref 12.0–15.0)
Immature Granulocytes: 1 %
Lymphocytes Relative: 5 %
Lymphs Abs: 0.4 10*3/uL — ABNORMAL LOW (ref 0.7–4.0)
MCH: 26.7 pg (ref 26.0–34.0)
MCHC: 29.9 g/dL — ABNORMAL LOW (ref 30.0–36.0)
MCV: 89.5 fL (ref 80.0–100.0)
Monocytes Absolute: 0.6 10*3/uL (ref 0.1–1.0)
Monocytes Relative: 6 %
Neutro Abs: 7.9 10*3/uL — ABNORMAL HIGH (ref 1.7–7.7)
Neutrophils Relative %: 84 %
Platelets: 243 10*3/uL (ref 150–400)
RBC: 4.08 MIL/uL (ref 3.87–5.11)
RDW: 23.2 % — ABNORMAL HIGH (ref 11.5–15.5)
WBC: 9.4 10*3/uL (ref 4.0–10.5)
nRBC: 0 % (ref 0.0–0.2)

## 2020-03-26 LAB — GLUCOSE, CAPILLARY
Glucose-Capillary: 68 mg/dL — ABNORMAL LOW (ref 70–99)
Glucose-Capillary: 71 mg/dL (ref 70–99)
Glucose-Capillary: 166 mg/dL — ABNORMAL HIGH (ref 70–99)
Glucose-Capillary: 167 mg/dL — ABNORMAL HIGH (ref 70–99)
Glucose-Capillary: 81 mg/dL (ref 70–99)

## 2020-03-26 LAB — POTASSIUM
Potassium: 4.6 mmol/L (ref 3.5–5.1)
Potassium: 4.5 mmol/L (ref 3.5–5.1)

## 2020-03-26 MED ORDER — MAGNESIUM HYDROXIDE 400 MG/5ML PO SUSP: 30.0000 mL | Freq: Every day | ORAL | Status: DC | PRN

## 2020-03-26 MED ORDER — SODIUM CHLORIDE 0.9 % IV SOLN
8.0000 mg | Freq: Four times a day (QID) | INTRAVENOUS | Status: DC
  Administered 2020-03-26: 8 mg via INTRAVENOUS
  Filled 2020-03-26: qty 4

## 2020-03-26 MED ORDER — MIDODRINE HCL 5 MG PO TABS
2.5000 mg | ORAL_TABLET | Freq: Two times a day (BID) | ORAL | Status: DC
  Administered 2020-03-26 – 2020-03-31 (×10): 2.5 mg via ORAL
  Filled 2020-03-26 (×10): qty 1

## 2020-03-26 MED ORDER — LORAZEPAM 0.5 MG PO TABS: 0.5000 mg | ORAL_TABLET | ORAL | Status: DC | PRN

## 2020-03-26 MED ORDER — POLYETHYLENE GLYCOL 3350 17 G PO PACK
17.0000 g | PACK | Freq: Two times a day (BID) | ORAL | Status: DC
  Filled 2020-03-26 (×12): qty 1

## 2020-03-26 MED ORDER — METOLAZONE 5 MG PO TABS
2.5000 mg | ORAL_TABLET | Freq: Once | ORAL | Status: AC
  Administered 2020-03-26: 2.5 mg via ORAL
  Filled 2020-03-26: qty 1

## 2020-03-26 MED ORDER — ONDANSETRON HCL 4 MG PO TABS
8.0000 mg | ORAL_TABLET | Freq: Four times a day (QID) | ORAL | Status: DC
  Administered 2020-03-26 – 2020-03-28 (×9): 8 mg via ORAL
  Filled 2020-03-26 (×9): qty 2

## 2020-03-26 MED ORDER — OLANZAPINE 5 MG PO TABS
5.0000 mg | ORAL_TABLET | Freq: Every day | ORAL | Status: DC
  Administered 2020-03-26 – 2020-04-05 (×11): 5 mg via ORAL
  Filled 2020-03-26 (×11): qty 1

## 2020-03-26 MED ORDER — MIDODRINE HCL 5 MG PO TABS: 2.5000 mg | ORAL_TABLET | Freq: Three times a day (TID) | ORAL | Status: DC

## 2020-03-26 NOTE — Progress Notes (Signed)
[facility]) Community Based Palliative Care       This patient has been referred to our palliative care services in the community.  [facility] will continue to follow for any discharge planning needs and to coordinate continuation of palliative care.   If you have questions or need assistance, please call [phone] or contact the hospital Liaison listed on AMION.     Thank you for the opportunity to participate in this patient's care.     Chrislyn King, BSN, RN [facility] Hospital Liaison   [phone]  

## 2020-03-26 NOTE — Progress Notes (Signed)
Orthopedic Tech Progress Note Patient Details:  [name] 1946/03/02 [id number]  Ortho Devices Type of Ortho Device: Haematologist Ortho Device/Splint Location: BLE Ortho Device/Splint Interventions: Ordered, Application   Post Interventions Patient Tolerated: Well Instructions Provided: Care of [place] 03/26/2020, 11:02 AM

## 2020-03-26 NOTE — Progress Notes (Signed)
Overall, [name] had a fairly decent weekend.  She is having nausea this morning.  She feels it is from the amiodarone.  I will increase the Zofran to every 6 hours on a schedule.  We will also start her on some Zyprexa (5 mg p.o. nightly) to see if this might help.  Will try little Ativan also as needed (0.5 mg p.o. every 4 hours as needed)  Her pain seems to be doing fairly well right now.  She still has a lot of swelling in the legs.  The legs are still wrapped.  She has had the Faslodex on Friday.  She also started on Aromasin.  She is supposed to have immunotherapy this Thursday.,  Sure she will be able to make this or not.  Her labs show white cell count 9.4.  Hemoglobin 10.9.  Platelet count 243,000.  Her creatinine is 1.44 with a BUN of 48.  Her calcium is low at 6.5 but her albumin is low at 2.6.  Her potassium is 5.1.  She seems to be eating maybe a little bit better.  She has had no problems going to the bathroom.  May be a little bit of constipation.  She has had no cough.  She has had some chronic shortness of breath.  She still has a rapid atrial fibrillation.  Her vital signs show temperature 97.9.  Pulse 73.  Blood pressure 109/58.  Head and neck exam shows no scleral icterus.  She has no oral lesions.  There is no thrush.  She has no adenopathy in the neck.  Lungs are with decent breath sounds bilaterally.  No wheezes are noted.  Cardiac exam tachycardic and irregular.  This is consistent with atrial fibrillation.  Abdomen is soft.  Bowel sounds are active.  There is no guarding or rebound tenderness.  Back exam shows some slight discomfort to palpation over the spine.  Extremities shows wrappings around both lower legs.  Neurological exam is nonfocal.  I will speak to radiation oncology today.  They will review her past radiation therapy plan and see if she might be a candidate for additional radiation therapy.  Hopefully, nausea will get better today.  I appreciate the  outstanding help in care that she is getting from all the staff up on [address].  I know that they are doing a tremendous job with her.  Kayla Haw, MD  1 Thessalonians 5:11

## 2020-03-26 NOTE — Progress Notes (Signed)
[facility]  PROGRESS NOTE  IVANNAH ZODY JYN:[id number] DOB: Nov 26, 1945 DOA: 03/15/2020 PCP: [name], No Pcp Per Admit date - 03/08/2020   Admitting Physician Vernelle Emerald, MD  Outpatient Primary MD for the [name] is [name], No Pcp Per  LOS - 8 Brief Narrative   PRICELLA GAUGH is a 74 y.o. 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 fib status post ICD, breast cancer with bone mets/Lynch syndrome who presented on 8/14 with several days of shortness of breath, worsening redness of lower legs and weeping lesions for the past 2 weeks in setting of using Derm stone to scrape her dry skin and found to have A. fib with RVR to the 120s and sepsis physiology (white count of 24.5, lactic acid 2.2, Respiratory rate of 21) secondary to bilateral lower extremity cellulitis course complicated by A. fib with RVR, AKI on CKD with hyperkalemia    Subjective  Eating ok with zofran prior to meals. Still having trouble finding food she can tolerate  A & P  Sepsis secondary to nonpurulent cellulitis of bilateral lower extremities, resolved.  Unfortunate consequence of exfoliativestone use to skin.  Remains afebrile, white count downtrending, blood cultures unremarkable, sepsis physiology resolved. (Vancomycin 8/14, 8/16 and Meropenem 8/15-8/17) -Continue IV ceftriaxone,completed 7 day course,end date 8/22.   A. fib with RVR, likely exacerbated in setting of sepsis due to above, persists.  Heart rate remains in the 110s to 120s .  [name]'s been unable to tolerate IV amiodarone due to nausea in the past, CCB is not an option with decreased EF, hypotension to prior beta-blockade ([name] declines IV amiodarone or higher p.o. dosing) -Appreciate cardiology recommendations, continue low-dose p.o. amiodarone 200 mg daily--she has been  declining due to worsening nausea   Acute on chronic, systolic/diastolic CHF.  Still quite volume overloaded on exam, some slight improvement in bilateral lower extremity swelling with 2-3+ pitting edema bilateral lower extremities.  In the setting of sepsis on admission and holding home diuretics.  BNP in the 900s on admission -Appreciate heart failure recommendations, continue IV Lasix 80 mg daily, monitor output and volume status and kidney function -Intolerant to losartan due to hyperkalemia  Slightly elevated high-sensitivity troponin CAD status post CABG (2018).  Suspect demand ischemia in setting of sepsis and A. fib with RVR Not having any anginal equivalents, does have chronic constant left-sided flank pain for several months related to radiation-continue home Eliquis and statin  Left-sided rib cage pain, found to have worsening pre-existing pathologic fractures in thoracic spine on CT chest (T5 and T8) and development of paraspinal soft tissue soft tissue lumbar spine concerning for worsening malignancy.  Pain control improved after IV Toradol -Continue pain control with Home MS Contin, as needed IV Morphine --optimize bowel regimen   History of VT/V. fib, status post ICD (2016), history of V. fib arrest in 11/2016 -Has not been able to tolerate amiodarone or beta blockade in the past -Currently on amiodarone 20 mg daily  Metastatic breast cancer with bone mets.  Diagnosed 12/2019. -Continue outpatient follow-up with Dr. Marin Olp, on immunotherapy. --Appreciate Dr. Antonieta Pert assistance, he will discuss with radiology   Type 2 diabetes, A1c 7.2. FBG in 60s likely related to poor po intake ( forgot to order dinner). She's not required any insulin since yesterday around noon time -Holding home glipizide -Monitor CBG, sliding  scale as needed --Nutrition consulted to assist with diet supplementation  Acute on CKD with hyperkalemia ,cr improving.   Peak creatinine of 2.2, baseline of  ~1.6. Required lokelma on 8/20 for recurrent hyperkalemia, now improved at 5.Creatinine of 1.4, back at baseline -Avoid nephrotoxins (had toradol x3 for back pain on 8/19) -Monitor BMP --Monitor output  Hypocalcemia. Even corrected for hypoalbuminemia Ca is 7.2. Phos and Mg wnl.  --check PTH --Vit  (1,,25 and 25)-     Family Communication  : None  Code Status : DNR, discussed on day of admission  Disposition Plan  :  [name] is from home. Anticipated d/c date: 2 to 3 days. Barriers to d/c or necessity for inpatient status:  Needs continued IV Lasix for volume status, Consults  : Advanced heart failure, oncology  Procedures  : Venous duplex 8/18  DVT Prophylaxis  : Eliquis Lab Results  Component Value Date   PLT 243 03/26/2020    Diet :  Diet Order            Diet regular Room service appropriate? Yes; Fluid consistency: Thin  Diet effective now                  Inpatient Medications Scheduled Meds: . amiodarone  200 mg Oral Daily  . apixaban  5 mg Oral BID  . exemestane  25 mg Oral QPC breakfast  . furosemide  80 mg Intravenous Daily  . insulin aspart  0-15 Units Subcutaneous TID AC & HS  . midodrine  2.5 mg Oral TID WC  . mirtazapine  7.5 mg Oral QHS  . morphine  15 mg Oral Q12H  . OLANZapine  5 mg Oral QHS  . ondansetron  8 mg Oral Q6H  . senna  2 tablet Oral BID   Continuous Infusions: . sodium chloride Stopped (03/22/20 1658)  . ondansetron (ZOFRAN) IV     PRN Meds:.sodium chloride, acetaminophen **OR** acetaminophen, HYDROmorphone (DILAUDID) injection, LORazepam, morphine, polyethylene glycol, sodium chloride  Antibiotics  :   Anti-infectives (From admission, onward)   Start     Dose/Rate Route Frequency Ordered Stop   03/20/20 1430  cefTRIAXone (ROCEPHIN) 2 g in sodium chloride 0.9 % 100 mL IVPB        2 g 200 mL/hr over 30 Minutes Intravenous Every 24 hours 03/20/20 1345 03/25/20 1320   03/20/20 0600  vancomycin (VANCOREADY) IVPB 1250 mg/250 mL   Status:  Discontinued        1,250 mg 166.7 mL/hr over 90 Minutes Intravenous Every 48 hours 03/18/20 0230 03/20/20 1345   03/18/20 0330  vancomycin (VANCOREADY) IVPB 1500 mg/300 mL        1,500 mg 150 mL/hr over 120 Minutes Intravenous  Once 03/18/20 0230 03/18/20 0803   03/18/20 0330  meropenem (MERREM) 1 g in sodium chloride 0.9 % 100 mL IVPB  Status:  Discontinued        1 g 200 mL/hr over 30 Minutes Intravenous 2 times daily 03/18/20 0230 03/20/20 1345   03/18/20 0130  piperacillin-tazobactam (ZOSYN) IVPB 3.375 g  Status:  Discontinued        3.375 g 100 mL/hr over 30 Minutes Intravenous  Once 03/18/20 0115 03/18/20 0214       Objective   Vitals:   03/26/20 0400 03/26/20 0406 03/26/20 0423 03/26/20 0700  BP:  (!) 109/58  99/83  Pulse:  73  100  Resp:  17  19  Temp:  97.9 F (36.6 C)  98.2 F (  36.8 C)  TempSrc:  Oral  Oral  SpO2: 94% 94%    Weight:   73.7 kg   Height:        SpO2: 94 % O2 Flow Rate (L/min): 0 L/min  Wt Readings from Last 3 Encounters:  03/26/20 73.7 kg  01/30/20 82.9 kg  12/28/19 82.9 kg     Intake/Output Summary (Last 24 hours) at 03/26/2020 1049 Last data filed at 03/26/2020 0700 Gross per 24 hour  Intake --  Output 700 ml  Net -700 ml    Physical Exam:     Awake Alert, Oriented X 3, Normal affect No new F.N deficits,  [place].AT, Normal respiratory effort on room air, CTAB Irregularly irregular heart rhythm, tachycardic, 2-3+ pitting edema bilateral lower extremities to knee +ve B.Sounds, Abd Soft, No tenderness, No rebound, guarding or rigidity. Right lateral leg with large area of erythema and weeping lesions      I have personally reviewed the following:   Data Reviewed:  CBC Recent Labs  Lab 03/20/20 0248 03/21/20 0449 03/22/20 0954 03/25/20 1110 03/26/20 0225  WBC 18.1* 12.3* 12.5* 11.4* 9.4  HGB 11.1* 11.3* 11.3* 11.5* 10.9*  HCT 36.5 37.3 37.7 39.3 36.5  PLT 229 235 247 252 243  MCV 86.9 88.6 88.1 92.7 89.5   MCH 26.4 26.8 26.4 27.1 26.7  MCHC 30.4 30.3 30.0 29.3* 29.9*  RDW 22.9* 23.0* 23.2* 23.5* 23.2*  LYMPHSABS 0.5* 0.6*  --   --  0.4*  MONOABS 0.8 0.7  --   --  0.6  EOSABS 0.3 0.7*  --   --  0.3  BASOSABS 0.0 0.1  --   --  0.1    Chemistries  Recent Labs  Lab 03/23/20 0245 03/23/20 0245 03/23/20 1127 03/23/20 1505 03/24/20 0714 03/24/20 1229 03/25/20 0312 03/25/20 0724 03/25/20 0816 03/25/20 1110 03/25/20 1606 03/25/20 2124 03/26/20 0225 03/26/20 0730  NA 128*  --  132*  --  133*  --  132*  --   --   --   --   --  136  --   K 6.6*   < > 5.3*   < > 5.0   < > 4.7   < >  --  6.0* 5.3* 4.8 5.1 4.6  CL 99  --  98  --  97*  --  99  --   --   --   --   --  102  --   CO2 17*  --  20*  --  22  --  21*  --   --   --   --   --  22  --   GLUCOSE 87  --  110*  --  106*  --  79  --   --   --   --   --  79  --   BUN 52*  --  53*  --  53*  --  53*  --   --   --   --   --  48*  --   CREATININE 1.72*  --  1.80*  --  1.87*  --  1.54*  --   --   --   --   --  1.44*  --   CALCIUM 6.3*  --  6.6*  --  6.8*  --  6.2*  --   --   --   --   --  6.5*  --   MG  --   --   --   --   --   --   --   --  2.2  --   --   --   --   --   AST 36  --   --   --   --   --   --   --  28  --   --   --  27  --   ALT 23  --   --   --   --   --   --   --  24  --   --   --  23  --   ALKPHOS 126  --   --   --   --   --   --   --  105  --   --   --  98  --   BILITOT 2.1*  --   --   --   --   --   --   --  1.0  --   --   --  1.0  --    < > = values in this interval not displayed.   ------------------------------------------------------------------------------------------------------------------ No results for input(s): CHOL, HDL, LDLCALC, TRIG, CHOLHDL, LDLDIRECT in the last 72 hours.  Lab Results  Component Value Date   HGBA1C 7.2 (H) 03/18/2020   ------------------------------------------------------------------------------------------------------------------ No results for input(s): TSH, T4TOTAL, T3FREE,  THYROIDAB in the last 72 hours.  Invalid input(s): FREET3 ------------------------------------------------------------------------------------------------------------------ No results for input(s): VITAMINB12, FOLATE, FERRITIN, TIBC, IRON, RETICCTPCT in the last 72 hours.  Coagulation profile No results for input(s): INR, PROTIME in the last 168 hours.  No results for input(s): DDIMER in the last 72 hours.  Cardiac Enzymes No results for input(s): CKMB, TROPONINI, MYOGLOBIN in the last 168 hours.  Invalid input(s): CK ------------------------------------------------------------------------------------------------------------------    Component Value Date/Time   BNP 912.0 (H) 03/18/2020 0229   BNP 398.6 (H) 09/13/2015 1158    Micro Results Recent Results (from the past 240 hour(s))  Culture, blood (routine x 2)     Status: None   Collection Time: 03/18/20  2:25 AM   Specimen: BLOOD  Result Value Ref Range Status   Specimen Description BLOOD RIGHT HAND  Final   Special Requests   Final    BOTTLES DRAWN AEROBIC AND ANAEROBIC Blood Culture results may not be optimal due to an excessive volume of blood received in culture bottles   Culture   Final    NO GROWTH 5 DAYS Performed at [facility], [place] [address] [place], [place] [address]    Report Status 03/23/2020 FINAL  Final  SARS Coronavirus 2 by RT PCR (hospital order, performed in [facility] hospital lab) Nasopharyngeal Nasopharyngeal Swab     Status: None   Collection Time: 03/18/20  2:27 AM   Specimen: Nasopharyngeal Swab  Result Value Ref Range Status   SARS Coronavirus 2 NEGATIVE NEGATIVE Final    Comment: (NOTE) SARS-CoV-2 target nucleic acids are NOT DETECTED.  The SARS-CoV-2 RNA is generally detectable in upper and lower respiratory specimens during the acute phase of infection. The lowest concentration of SARS-CoV-2 viral copies this assay can detect is 250 copies / mL. A negative result does not preclude  SARS-CoV-2 infection and should not be used as the sole basis for treatment or other [name] management decisions.  A negative result may occur with improper specimen collection / handling, submission of specimen other than nasopharyngeal swab, presence of viral mutation(s) within the areas targeted by this assay, and inadequate number of viral copies (<250 copies / mL). A negative result must be combined  with clinical observations, [name] history, and epidemiological information.  Fact Sheet for Patients:   StrictlyIdeas.no  Fact Sheet for Healthcare Providers: BankingDealers.co.za  This test is not yet approved or  cleared by the [place] FDA and has been authorized for detection and/or diagnosis of SARS-CoV-2 by FDA under an Emergency Use Authorization (EUA).  This EUA will remain in effect (meaning this test can be used) for the duration of the COVID-19 declaration under Section 564(b)(1) of the Act, 21 U.S.C. section 360bbb-3(b)(1), unless the authorization is terminated or revoked sooner.  Performed at [facility], [place] [address] [place], [place] [address]   Culture, blood (routine x 2)     Status: None   Collection Time: 03/18/20  2:29 AM   Specimen: BLOOD  Result Value Ref Range Status   Specimen Description BLOOD RIGHT ARM  Final   Special Requests   Final    BOTTLES DRAWN AEROBIC ONLY Blood Culture adequate volume   Culture   Final    NO GROWTH 5 DAYS Performed at [facility], [address] [place], [place] [address]    Report Status 03/23/2020 FINAL  Final  MRSA PCR Screening     Status: None   Collection Time: 03/18/20  6:13 AM   Specimen: Nasopharyngeal  Result Value Ref Range Status   MRSA by PCR NEGATIVE NEGATIVE Final    Comment:        The GeneXpert MRSA Assay (FDA approved for NASAL specimens only), is one component of a comprehensive MRSA colonization surveillance program. It is  not intended to diagnose MRSA infection nor to guide or monitor treatment for MRSA infections. Performed at [facility], [place] [address], [place], [place] [address]     Radiology Reports DG Chest 2 View  Result Date: 03/11/2020 CLINICAL DATA:  Chest pain EXAM: CHEST - 2 VIEW COMPARISON:  12/04/2019, PET CT 12/22/2019 FINDINGS: Post sternotomy changes with left atrial appendage clip. Left-sided pacing device as before. Cardiomegaly with mildly enlarged central pulmonary vessels. No overt edema. No consolidation, pleural effusion, or pneumothorax. Moderate compression deformity of mid to lower thoracic vertebra, new or increased as compared to chest CT from May 2021. IMPRESSION: Cardiomegaly without overt failure. New mild to moderate compression deformity of mid to lower thoracic vertebra. Electronically Signed   By: Donavan Foil M.D.   On: 03/26/2020 20:18   CT CHEST WO CONTRAST  Addendum Date: 03/22/2020   ADDENDUM REPORT: 03/22/2020 09:53 ADDENDUM: Spine fractures with 2 column involvement at T5 and T8 were discussed along with worsening spinal alignment and paraspinal soft tissue along the upper thoracic spine were discussed with the provider is outlined below. MRI could be considered if cardiac device will allow. These results were called by telephone at the time of interpretation on 03/22/2020 at 9:53 am to provider [facility] , who verbally acknowledged these results. Electronically Signed   By: Zetta Bills M.D.   On: 03/22/2020 09:53   Result Date: 03/22/2020 CLINICAL DATA:  Breast cancer, surveillance of metastatic disease, significant pain reported to LEFT lateral chest. EXAM: CT CHEST WITHOUT CONTRAST TECHNIQUE: Multidetector CT imaging of the chest was performed following the standard protocol without IV contrast. COMPARISON:  Dec 04, 2019 and PET evaluation of Dec 22, 2019 FINDINGS: Cardiovascular: Calcified atheromatous plaque of the thoracic aorta. No aneurysmal dilation.  Post median sternotomy for CABG with unchanged cardiac enlargement. Mitral annular calcification and cardiac pacer defibrillator and LEFT atrial clipping as before. Engorgement  of central pulmonary vasculature with similar appearance. Limited assessment of vascular structures in the chest due to lack of intravenous contrast. Mediastinum/Nodes: No adenopathy at the thoracic inlet. No adenopathy in the axilla. No mediastinal lymphadenopathy. Esophagus mildly patulous. Lungs/Pleura: No pleural effusion. Interstitial thickening at the lung apices is persistent and somewhat irregular. For instance on image 29 of series 4 there is more profound interstitial thickening with a bandlike appearance changes are seen in the medial chest with similar appearance. In the RIGHT upper lobe a bandlike area extends from the hilum into the RIGHT upper lobe. Some scarring along the RIGHT heart border with mild bronchial dilation with slight increase since the previous study. No discrete mass. Upper Abdomen: Incidental imaging of upper abdominal contents show no acute process to the extent evaluated. Adrenal glands are normal. Musculoskeletal: Spinous process fracture incidentally noted at C7, incompletely imaged. Slight interval decrease in height of the T1 vertebral body anteriorly compatible with mild compression approximately 10%. This was likely present previously. Interval development of pathologic fractures of the thoracic spine since previous imaging with worsening soft tissue about the upper thoracic spine at the level of T2, T3 and T4 with pathologic fractures at all these levels. T2 with minimal loss of height and T3 and T4 with approximately 20% loss of height. Also with approximately 50% loss of height at the T5 level worse on the LEFT with resultant dextroconvexity of the spine at this location that was mild previously and is now moderate in terms of curvature. The compression fractures present at T4-T5 on the prior study  have worsened in the interval. Interval approximately 40-50% loss of height of the T8 vertebral body. Fracture at this level and at the T5 level displaying signs of posterior cortical involvement of the vertebral body. Only very minimal retropulsion is noted at these levels. Interval 20% loss of height of the L1 vertebral body since the prior study. Diffuse heterogeneity of visualized bones in this [name] with known widespread bony metastases. LEFT breast mass may be slightly smaller than on the previous study measuring approximately 3.6 x 1.3 cm. Greatest thickness on the prior study was approximately 2.4 cm. IMPRESSION: 1. Worsening of pre-existing pathologic fractures in the thoracic spine with new T8 compression and new L1 compression as described. There is mild posterior cortical retropulsion at the T5 and T8 levels. 2. Development of soft tissue along lateral margin of the spine likely related to worsening malignancy. MRI of the spine could be helpful as clinically warranted for further assessment. 3. Septal thickening at the lung apices could be a chronic finding but could also be seen in the setting of lymphangitic carcinomatosis. Attention on follow-up. 4. Breast mass may be slightly smaller than on the previous study. 5. Similar appearance of central pulmonary arterial enlargement likely related to underlying pulmonary arterial hypertension. 6. Signs of cardiac disease as before and changes of median sternotomy for CABG. A call is out to the referring provider to further discuss findings in the above case. Aortic Atherosclerosis (ICD10-I70.0). Electronically Signed: By: Zetta Bills M.D. On: 03/22/2020 09:42   VAS [place] LOWER EXTREMITY VENOUS (DVT)  Result Date: 03/20/2020  Lower Venous DVTStudy Indications: Edema.  Risk Factors: Cancer Breast. Limitations: Bandages and body habitus. Comparison Study: Performing Technologist: Antonieta Pert RDMS, RVT  Examination Guidelines: A complete evaluation  includes B-mode imaging, spectral Doppler, color Doppler, and power Doppler as needed of all accessible portions of each vessel. Bilateral testing is considered an integral part of a complete  examination. Limited examinations for reoccurring indications may be performed as noted. The reflux portion of the exam is performed with the [name] in reverse Trendelenburg.  +---------+---------------+---------+-----------+----------+--------------+ RIGHT    CompressibilityPhasicitySpontaneityPropertiesThrombus Aging +---------+---------------+---------+-----------+----------+--------------+ CFV      Full           Yes      Yes                                 +---------+---------------+---------+-----------+----------+--------------+ SFJ      Full                                                        +---------+---------------+---------+-----------+----------+--------------+ FV Prox  Full                                                        +---------+---------------+---------+-----------+----------+--------------+ FV Mid   Full                                                        +---------+---------------+---------+-----------+----------+--------------+ FV DistalFull                                                        +---------+---------------+---------+-----------+----------+--------------+ PFV      Full                                                        +---------+---------------+---------+-----------+----------+--------------+ POP      Full           Yes      Yes                                 +---------+---------------+---------+-----------+----------+--------------+ PTV                                                   Not visualized +---------+---------------+---------+-----------+----------+--------------+ PERO                                                  Not visualized  +---------+---------------+---------+-----------+----------+--------------+ GSV      Full                                                        +---------+---------------+---------+-----------+----------+--------------+   +---------+---------------+---------+-----------+----------+--------------+  LEFT     CompressibilityPhasicitySpontaneityPropertiesThrombus Aging +---------+---------------+---------+-----------+----------+--------------+ CFV      Full                                                        +---------+---------------+---------+-----------+----------+--------------+ SFJ      Full                                                        +---------+---------------+---------+-----------+----------+--------------+ FV Prox  Full                                                        +---------+---------------+---------+-----------+----------+--------------+ FV Mid   Full                                                        +---------+---------------+---------+-----------+----------+--------------+ FV DistalFull                                                        +---------+---------------+---------+-----------+----------+--------------+ PFV      Full                                                        +---------+---------------+---------+-----------+----------+--------------+ POP      Full                                                        +---------+---------------+---------+-----------+----------+--------------+ PTV                                                   Not visualized +---------+---------------+---------+-----------+----------+--------------+ PERO                                                  Not visualized +---------+---------------+---------+-----------+----------+--------------+ GSV      Full                                                         +---------+---------------+---------+-----------+----------+--------------+  hypoechoic fluid collection posterior left popliteal fossa 3.9cm    Summary: RIGHT: - There is no evidence of deep vein thrombosis in the lower extremity. However, portions of this examination were limited- see technologist comments above.  - No cystic structure found in the popliteal fossa. - calf veins not visulaized due to bandages  LEFT: - There is no evidence of deep vein thrombosis in the lower extremity. However, portions of this examination were limited- see technologist comments above.  - A cystic structure is found in the popliteal fossa. - calf veins not visulaized due to bandages  *See table(s) above for measurements and observations. Electronically signed by Deitra Mayo MD on 03/20/2020 at 5:10:34 PM.    Final      Time Spent in minutes  30     Desiree Hane M.D on 03/26/2020 at 10:49 AM  To page go to [email or web address] - password [email or web address]

## 2020-03-26 NOTE — Progress Notes (Signed)
   03/26/20 1637  Clinical Encounter Type  Visited With Patient  Visit Type Initial;Spiritual support  Referral From Physician  Consult/Referral To Chaplain  Spiritual Encounters  Spiritual Needs Prayer  Stress Factors  Patient Stress Factors Health changes  This chaplain responded to MD consult for spiritual care.  The chaplain was updated by the Pt. RN-Amanda before the visit.  The Pt. started the spiritual care visit with "How can I trust you?"  The chaplain learned through 90 minutes of reflective listening the Pt. has a history of medical crisis when she hoped she would retire. The Pt. shared the new diagnosis of Stage IV Cancer has left her hoping for good days and recognizing the need to communicate with family.  The chaplain understands significant people in the Pt. life have been a witness of God's love and will continue to be a source of strength through her faith.  The Pt. accepted the chaplain's invitation for continued spiritual care and prayer.

## 2020-03-26 NOTE — Progress Notes (Signed)
PT Cancellation Note  Patient Details Name: [name] MRN: [id number] DOB: 05-20-1946   Cancelled Treatment:    Reason Eval/Treat Not Completed: Other (comment) (pt currently getting RLE wrapped and awaiting Unna boot but reports she will attempt walking today on return visit)   Karah Caruthers B Bernadette Armijo 03/26/2020, 10:30 AM [place] Pager: [phone] Office: [phone]

## 2020-03-26 NOTE — Progress Notes (Signed)
Physical Therapy Treatment Patient Details Name: [name] MRN: [id number] DOB: 19-Nov-1945 Today's Date: 03/26/2020    History of Present Illness [name] is a 74 y.o. 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 fib, ventricular tachycardia status post ICD in situ, breast cancer with bone mets, Lynch syndrome, Patient presented to the ED on 03/30/2020 with complaint of shortness of breath, worsening redness on both upper legs with weeping lesions    PT Comments    Pt willing to walk today after unna boots applied and needed encouragement and reassurance for safety. Pt denied further gait due to unna boot paste seeping through socks. Pt encouraged to maximize mobility increasing distance to [facility] or even walking to bathroom with staff as well as continued HEP. Pt remains adamant that she can function at home and that family will assist 24hrs/day. Pt with improved pain management today with limited mobility progression.      Follow Up Recommendations  Supervision/Assistance - 24 hour (pt refused any followup therapy)     Equipment Recommendations  3in1 (PT)    Recommendations for Other Services       Precautions / Restrictions Precautions Precautions: Fall Precaution Comments: unna boots    Mobility  Bed Mobility Overal bed mobility: Modified Independent Bed Mobility: Sit to Supine           General bed mobility comments: pt sitting EOB on arrival and states she was sitting up for several hours. Return to bed with HOB 20 degrees and reliance on rail with discussion of rail for home use  Transfers Overall transfer level: Needs assistance   Transfers: Sit to/from Stand Sit to Stand: Min guard         General transfer comment: guarding for safety without physical assist  Ambulation/Gait Ambulation/Gait assistance: Min guard Gait Distance (Feet): 15 Feet Assistive device: Rolling walker (2  wheeled) Gait Pattern/deviations: Step-through pattern;Decreased stride length;Trunk flexed;Wide base of support   Gait velocity interpretation: 1.31 - 2.62 ft/sec, indicative of limited community ambulator General Gait Details: pt with reliance on RW for gait with pt noting unna boot paste soaking through socks and very fearful of falling with paste present, ambulated around bed then back and refused further due to fear. No SOB or noted LOB with limited gait. Pt does endorse she would walk much further at home   Stairs             Wheelchair Mobility    Modified Rankin (Stroke Patients Only)       Balance Overall balance assessment: Needs assistance Sitting-balance support: No upper extremity supported;Feet supported Sitting balance-Leahy Scale: Good     Standing balance support: During functional activity;Bilateral upper extremity supported Standing balance-Leahy Scale: Poor Standing balance comment: reliant on UE's and RW                            Cognition Arousal/Alertness: Awake/alert Behavior During Therapy: WFL for tasks assessed/performed Overall Cognitive Status: Impaired/Different from baseline Area of Impairment: Safety/judgement                         Safety/Judgement: Decreased awareness of deficits     General Comments: pt exhibits self limiting behavior due to pain and fear of falling      Exercises General Exercises - Lower Extremity Long Arc Quad: AROM;Both;Seated;15 reps Hip Flexion/Marching: AAROM;Both;Seated;15  reps    General Comments        Pertinent Vitals/Pain Pain Score: 4  Pain Location: left chest Pain Descriptors / Indicators: Aching;Discomfort Pain Intervention(s): Limited activity within patient's tolerance;Monitored during session;Premedicated before session;Repositioned    Home Living                      Prior Function            PT Goals (current goals can now be found in the care  plan section) Progress towards PT goals: Progressing toward goals    Frequency    Min 3X/week      PT Plan Discharge plan needs to be updated    Co-evaluation              AM-PAC PT "6 Clicks" Mobility   Outcome Measure  Help needed turning from your back to your side while in a flat bed without using bedrails?: None Help needed moving from lying on your back to sitting on the side of a flat bed without using bedrails?: A Little Help needed moving to and from a bed to a chair (including a wheelchair)?: A Little Help needed standing up from a chair using your arms (e.g., wheelchair or bedside chair)?: A Little Help needed to walk in hospital room?: A Little Help needed climbing 3-5 steps with a railing? : A Lot 6 Click Score: 18    End of Session Equipment Utilized During Treatment: Gait belt Activity Tolerance: Patient tolerated treatment well Patient left: in bed;with call bell/phone within reach Nurse Communication: Mobility status PT Visit Diagnosis: Unsteadiness on feet (R26.81);Difficulty in walking, not elsewhere classified (R26.2);Pain     Time: 7673-4193 PT Time Calculation (min) (ACUTE ONLY): 26 min  Charges:  $Therapeutic Exercise: 8-22 mins $Therapeutic Activity: 8-22 mins                     Davarious Tumbleson P, PT Acute Rehabilitation Services Pager: [phone] Office: [place] 03/26/2020, 1:21 PM

## 2020-03-26 NOTE — Progress Notes (Addendum)
Advanced Heart Failure Rounding Note  PCP-Cardiologist: No primary care provider on file.     Patient Profile   74 y/o woman with recently diagnosed metastatic breast CA (undergoing XRT), CAD s/p CABG, DM2, systolic HF with EF 16-10% and PAF.   Admitted with sepsis and lactic acidosis due to severe LE cellulitis in setting of marked volume overload. Course complicated by AF with RVR.   Subjective:    SCr improving, 1.87>>1.54>>1.44. Remains significantly volume overloaded. Continues w/ bilateral LEE up to knees and weeping edema. SOB only w/ exertion. No resting symptoms.   Continue in Afib rates 110s. SBP 90s-120s. Continues w/ nausea after taking PO amio.   Pain controlled w/ morphine.    Objective:   Weight Range: 73.7 kg Body mass index is 27.88 kg/m.   Vital Signs:   Temp:  [97.6 F (36.4 C)-98.2 F (36.8 C)] 98.2 F (36.8 C) (08/23 0700) Pulse Rate:  [73-110] 100 (08/23 0700) Resp:  [12-19] 19 (08/23 0700) BP: (99-117)/(58-83) 99/83 (08/23 0700) SpO2:  [94 %-100 %] 94 % (08/23 0406) Weight:  [73.7 kg] 73.7 kg (08/23 0423) Last BM Date: 03/22/20  Weight change: Filed Weights   03/23/20 0500 03/25/20 0601 03/26/20 0423  Weight: 73.5 kg 73.9 kg 73.7 kg    Intake/Output:  No intake or output data in the 24 hours ending 03/26/20 0912    Physical Exam    General: chronically ill appearing, no respiratory difficulty  HEENT: normal Neck: supple. JVP elevated to jaw-line . Carotids 2+ bilat; no bruits. No lymphadenopathy or thryomegaly appreciated. Cor: PMI nondisplaced. Irregularly irregular rate & rhythm, tachy rate. No rubs, gallops or murmurs. Lungs: clear, no wheezing  Abdomen: soft, nontender, nondistended. No hepatosplenomegaly. No bruits or masses. Good bowel sounds. Extremities: no cyanosis, clubbing, rash, 2+ bilateral LEE up to knees both legs wrapped (continues w/ wheeping edema), multiple bruises bilateral UEs  Neuro: alert & orientedx3,  cranial nerves grossly intact. moves all 4 extremities w/o difficulty. Affect pleasant   Telemetry   A fib 110s Personally reviewed  EKG    No new EKG to review   Labs    CBC Recent Labs    03/25/20 1110 03/26/20 0225  WBC 11.4* 9.4  NEUTROABS  --  7.9*  HGB 11.5* 10.9*  HCT 39.3 36.5  MCV 92.7 89.5  PLT 252 960   Basic Metabolic Panel Recent Labs    03/25/20 0312 03/25/20 0724 03/25/20 0816 03/25/20 1110 03/26/20 0225 03/26/20 0730  NA 132*  --   --   --  136  --   K 4.7   < >  --    < > 5.1 4.6  CL 99  --   --   --  102  --   CO2 21*  --   --   --  22  --   GLUCOSE 79  --   --   --  79  --   BUN 53*  --   --   --  48*  --   CREATININE 1.54*  --   --   --  1.44*  --   CALCIUM 6.2*  --   --   --  6.5*  --   MG  --   --  2.2  --   --   --   PHOS  --   --  3.3  --   --   --    < > = values in this  interval not displayed.   Liver Function Tests Recent Labs    03/25/20 0816 03/26/20 0225  AST 28 27  ALT 24 23  ALKPHOS 105 98  BILITOT 1.0 1.0  PROT 6.4* 6.1*  ALBUMIN 2.6* 2.6*   No results for input(s): LIPASE, AMYLASE in the last 72 hours. Cardiac Enzymes No results for input(s): CKTOTAL, CKMB, CKMBINDEX, TROPONINI in the last 72 hours.  BNP: BNP (last 3 results) Recent Labs    12/04/19 1324 03/18/20 0229  BNP 581.1* 912.0*    ProBNP (last 3 results) No results for input(s): PROBNP in the last 8760 hours.   D-Dimer No results for input(s): DDIMER in the last 72 hours. Hemoglobin A1C No results for input(s): HGBA1C in the last 72 hours. Fasting Lipid Panel No results for input(s): CHOL, HDL, LDLCALC, TRIG, CHOLHDL, LDLDIRECT in the last 72 hours. Thyroid Function Tests No results for input(s): TSH, T4TOTAL, T3FREE, THYROIDAB in the last 72 hours.  Invalid input(s): FREET3  Other results:   Imaging    No results found.   Medications:     Scheduled Medications:  amiodarone  200 mg Oral Daily   apixaban  5 mg Oral BID    exemestane  25 mg Oral QPC breakfast   furosemide  80 mg Intravenous Daily   insulin aspart  0-15 Units Subcutaneous TID AC & HS   mirtazapine  7.5 mg Oral QHS   morphine  15 mg Oral Q12H   OLANZapine  5 mg Oral QHS   ondansetron  8 mg Oral Q6H   senna  2 tablet Oral BID    Infusions:  sodium chloride Stopped (03/22/20 1658)   ondansetron (ZOFRAN) IV      PRN Medications: sodium chloride, acetaminophen **OR** acetaminophen, HYDROmorphone (DILAUDID) injection, LORazepam, morphine, polyethylene glycol, sodium chloride   Assessment/Plan    1. Atrial Fibrillation with RVR - S/p MAZE and LAA clipping at time of CABG in 11/2016. S/p DCCV in 09/2015 and more recently 12/09/2019. Noted to be back in atrial fibrillation at last office visit on 01/30/2020 and started on low dose PO Amiodarone. - Presented in atrial fibrillation with RVR in the setting of sepsis secondary to cellulitis of bilateral lower extremities. Difficult situation complicated by patient's intolerance to multiple cardiac meds. V-tates currently in the 110s- 120s. - Not been able to tolerate IV Amiodarone due to severe nausea. - Not able to tolerate beta-blocker due to hypotension with syncope. - Calcium channel blockers not a good option with reduced EF. - Digoxin not a good option with current AKI.  - continue to treat w/ low dose PO amio, 200 mg daily + treatment of acute infection  - Continue Eliquis 5mg  twice daily.    2. Acute on Chronic Combined CHF - History of ischemic cardiomyopathy. PYP in the past negative for amyloid. - Most recent Echo in 12/2019 showed LVEF of 35-40% with severe asymmetric LVH, mild MR, moderate TR, and severely elevated PASP. - Chest x-ray showed cardiomegaly with mildly enlarged central pulmonary vessel but no overt edema. BNP 912 - Home Lasix held on admit and patient actually given some IV fluids on admission due to sepsis/ AKI. Diuretics restarted 8/17. Remains volume overloaded.    - Continue IV Lasix, consider increasing to 80 mg BID today now that SCr improved  - intolerant to Losartan due to hyperkalemia    3. CAD  - S/p CABG x4 in 11/2016.  - High-sensitivity troponin minimally elevated and flat at 22 >> 24. Not  consistent with ACS. Suspect demand ischemia in setting of sepsis and atrial fibrillation with RVR. - She reports constant left sided flank/breast pain that is worse with inspiration for the last several months. Sounds like this may be secondary to radiation. Nothing that sounds like angina. - No Aspirin with need for Eliquis.  - Continue statin.  4. 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 Had VF arrest in 11/2016 with appropriate ICD shock.  - No recurrent VT/VF.  - Had not been able to tolerate Amiodarone or beta-blocker in the past.  5. Sepsis Secondary to Bilateral Lower Extremity Cellulitis - initially placed on broad spectrum abx w/ vanc + meropenum. AF. Lactic acid normalized.  Blood cultures NGTD  - has completed course of abx, AF. Leukocytosis resolved   6. 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 - Diagnosed in 12/2019. Lynch syndrome diagnosed by genetic study.  - Follows with Dr. Marin Olp as outpatient. On immunotherapy   7. Hyperkalemia - required lokelma this admit  - K stable today at 4.6   8. Type 2 Diabetes Mellitus - Management per primary team.  9. Acute on Chronic Kidney Disease - baseline SCr 1.0-1.30 range  - Creatinine 2.20 on admission, sepsis and deceased PO intake with constant nausea and frequent vomiting -overall improved, down to 1.44 today  - continue to monitor w/ diuresis  Length of Stay: [address] PA-C  03/26/2020, 9:12 AM   Advanced Heart Failure Team Pager [phone] (M-F; 7a - 4p)  Please contact [facility] for night-coverage after hours (4p -7a ) and weekends on [email or web address]  Patient seen and examined with the above-signed Advanced Practice Provider and/or  Housestaff. I personally reviewed laboratory data, imaging studies and relevant notes. I independently examined the patient and formulated the important aspects of the plan. I have edited the note to reflect any of my changes or salient points. I have personally discussed the plan with the patient and/or family.  Continues to be in AF with RVR. Continues to ask for amio dose to be lowered. Legs remain very edematous & weeping. Cellulitis improved. UNNA boots placed this am.   General:  Sitting up on side of bed. No resp difficulty HEENT: normal Neck: supple. JVP to jaw. Carotids 2+ bilat; no bruits. No lymphadenopathy or thryomegaly appreciated. Cor: PMI nondisplaced. Irregular tachy Lungs: clear Abdomen: soft, nontender, nondistended. No hepatosplenomegaly. No bruits or masses. Good bowel sounds. Extremities: no cyanosis, clubbing, rash, 3+ edema + unna boots Neuro: alert & orientedx3, cranial nerves grossly intact. moves all 4 extremities w/o difficulty. Affect pleasant  Remains quite edematous. BP soft. In AF with RVR despite low-dose oral amio.   Will continue IV diuresis. Add metolazone. Will start midodrine to help BP and perhaps we can start low-dose b-blocker (which she has been resistant to in past).Continue Eliquis.   Glori Bickers, MD  12:51 PM

## 2020-03-26 NOTE — Progress Notes (Signed)
Pt. eating lunch. Chaplain accepted Pt. request for chaplain to return for spiritual care consult when she finishes lunch.

## 2020-03-27 ENCOUNTER — Inpatient Hospital Stay: Payer: PPO

## 2020-03-27 ENCOUNTER — Encounter: Payer: Self-pay | Admitting: *Deleted

## 2020-03-27 ENCOUNTER — Inpatient Hospital Stay: Payer: PPO | Admitting: Hematology & Oncology

## 2020-03-27 DIAGNOSIS — E440 Moderate protein-calorie malnutrition: Secondary | ICD-10-CM | POA: Insufficient documentation

## 2020-03-27 DIAGNOSIS — N1831 Chronic kidney disease, stage 3a: Secondary | ICD-10-CM

## 2020-03-27 DIAGNOSIS — D500 Iron deficiency anemia secondary to blood loss (chronic): Secondary | ICD-10-CM

## 2020-03-27 LAB — CBC WITH DIFFERENTIAL/PLATELET
Abs Immature Granulocytes: 0.05 10*3/uL (ref 0.00–0.07)
Basophils Absolute: 0.1 10*3/uL (ref 0.0–0.1)
Basophils Relative: 1 %
Eosinophils Absolute: 0.3 10*3/uL (ref 0.0–0.5)
Eosinophils Relative: 4 %
HCT: 31.5 % — ABNORMAL LOW (ref 36.0–46.0)
Hemoglobin: 9.6 g/dL — ABNORMAL LOW (ref 12.0–15.0)
Immature Granulocytes: 1 %
Lymphocytes Relative: 6 %
Lymphs Abs: 0.5 10*3/uL — ABNORMAL LOW (ref 0.7–4.0)
MCH: 27.2 pg (ref 26.0–34.0)
MCHC: 30.5 g/dL (ref 30.0–36.0)
MCV: 89.2 fL (ref 80.0–100.0)
Monocytes Absolute: 0.7 10*3/uL (ref 0.1–1.0)
Monocytes Relative: 9 %
Neutro Abs: 6.2 10*3/uL (ref 1.7–7.7)
Neutrophils Relative %: 79 %
Platelets: 276 10*3/uL (ref 150–400)
RBC: 3.53 MIL/uL — ABNORMAL LOW (ref 3.87–5.11)
RDW: 22.7 % — ABNORMAL HIGH (ref 11.5–15.5)
WBC: 7.9 10*3/uL (ref 4.0–10.5)
nRBC: 0 % (ref 0.0–0.2)

## 2020-03-27 LAB — IRON AND TIBC
Iron: 38 ug/dL (ref 28–170)
Saturation Ratios: 13 % (ref 10.4–31.8)
TIBC: 300 ug/dL (ref 250–450)
UIBC: 262 ug/dL

## 2020-03-27 LAB — COMPREHENSIVE METABOLIC PANEL
ALT: 19 U/L (ref 0–44)
AST: 21 U/L (ref 15–41)
Albumin: 2.3 g/dL — ABNORMAL LOW (ref 3.5–5.0)
Alkaline Phosphatase: 88 U/L (ref 38–126)
Anion gap: 12 (ref 5–15)
BUN: 44 mg/dL — ABNORMAL HIGH (ref 8–23)
CO2: 25 mmol/L (ref 22–32)
Calcium: 6.4 mg/dL — CL (ref 8.9–10.3)
Chloride: 98 mmol/L (ref 98–111)
Creatinine, Ser: 1.39 mg/dL — ABNORMAL HIGH (ref 0.44–1.00)
GFR calc Af Amer: 44 mL/min — ABNORMAL LOW (ref >60)
GFR calc non Af Amer: 38 mL/min — ABNORMAL LOW (ref >60)
Glucose, Bld: 91 mg/dL (ref 70–99)
Potassium: 4.1 mmol/L (ref 3.5–5.1)
Sodium: 135 mmol/L (ref 135–145)
Total Bilirubin: 1.1 mg/dL (ref 0.3–1.2)
Total Protein: 5.5 g/dL — ABNORMAL LOW (ref 6.5–8.1)

## 2020-03-27 LAB — GLUCOSE, CAPILLARY
Glucose-Capillary: 70 mg/dL (ref 70–99)
Glucose-Capillary: 187 mg/dL — ABNORMAL HIGH (ref 70–99)
Glucose-Capillary: 137 mg/dL — ABNORMAL HIGH (ref 70–99)
Glucose-Capillary: 103 mg/dL — ABNORMAL HIGH (ref 70–99)
Glucose-Capillary: 96 mg/dL (ref 70–99)

## 2020-03-27 LAB — MAGNESIUM: Magnesium: 2.1 mg/dL (ref 1.7–2.4)

## 2020-03-27 LAB — PTH, INTACT AND CALCIUM: Calcium, Total (PTH): 6.6 mg/dL — CL (ref 8.7–10.3)

## 2020-03-27 LAB — FERRITIN: Ferritin: 123 ng/mL (ref 11–307)

## 2020-03-27 LAB — CALCITRIOL (1,25 DI-OH VIT D): Vit D, 1,25-Dihydroxy: 84.4 pg/mL — ABNORMAL HIGH (ref 19.9–79.3)

## 2020-03-27 MED ORDER — SODIUM CHLORIDE 0.9 % IV SOLN
510.0000 mg | Freq: Once | INTRAVENOUS | Status: AC
  Administered 2020-03-28: 510 mg via INTRAVENOUS
  Filled 2020-03-27 (×2): qty 17

## 2020-03-27 MED ORDER — METOLAZONE 5 MG PO TABS
2.5000 mg | ORAL_TABLET | Freq: Once | ORAL | Status: AC
  Administered 2020-03-27: 2.5 mg via ORAL
  Filled 2020-03-27: qty 1

## 2020-03-27 MED ORDER — CALCIUM GLUCONATE-NACL 2-0.675 GM/100ML-% IV SOLN
2.0000 g | Freq: Once | INTRAVENOUS | Status: AC
  Administered 2020-03-27: 2000 mg via INTRAVENOUS
  Filled 2020-03-27: qty 100

## 2020-03-27 MED ORDER — METOPROLOL SUCCINATE ER 25 MG PO TB24: 25.0000 mg | ORAL_TABLET | Freq: Every day | ORAL | Status: DC

## 2020-03-27 MED ORDER — ADULT MULTIVITAMIN W/MINERALS CH
1.0000 | ORAL_TABLET | Freq: Every day | ORAL | Status: DC
  Administered 2020-03-27 – 2020-04-06 (×11): 1 via ORAL
  Filled 2020-03-27 (×10): qty 1

## 2020-03-27 MED ORDER — BOOST PLUS PO LIQD
237.0000 mL | Freq: Two times a day (BID) | ORAL | Status: DC
  Administered 2020-03-28 – 2020-04-07 (×10): 237 mL via ORAL
  Filled 2020-03-27 (×25): qty 237

## 2020-03-27 MED ORDER — METOPROLOL SUCCINATE ER 25 MG PO TB24
25.0000 mg | ORAL_TABLET | Freq: Every day | ORAL | Status: DC
  Administered 2020-03-27 – 2020-03-29 (×3): 25 mg via ORAL
  Filled 2020-03-27 (×4): qty 1

## 2020-03-27 MED ORDER — HYDROXYZINE HCL 25 MG PO TABS: 25.0000 mg | ORAL_TABLET | Freq: Four times a day (QID) | ORAL | Status: DC | PRN

## 2020-03-27 NOTE — Progress Notes (Addendum)
Pt refused taking metoprol 25 mg. Education about effectiveness of metoprolol but the pt kept refusing. MD (heart failure team) notified.   Lavenia Atlas, RN

## 2020-03-27 NOTE — Progress Notes (Signed)
Initial Nutrition Assessment  DOCUMENTATION CODES:   Non-severe (moderate) malnutrition in context of chronic illness  INTERVENTION:    Boost Plus chocolate BID- Each supplement provides 360kcal and 14g protein.   Magic cup TID with meals, each supplement provides 290 kcal and 9 grams of protein  MVI daily    NUTRITION DIAGNOSIS:   Moderate Malnutrition related to chronic illness, cancer and cancer related treatments as evidenced by energy intake < 75% for > or equal to 1 month, mild fat depletion, moderate muscle depletion.  GOAL:   Patient will meet greater than or equal to 90% of their needs  MONITOR:   PO intake, Supplement acceptance, Weight trends, Labs, I & O's  REASON FOR ASSESSMENT:   Consult Assessment of nutrition requirement/status  ASSESSMENT:   Patient with PMH significant for active L breast cancer metastatic to bone s/p XRT, HTN, CAD s/p CABG, DM, CHF, CKD III, ventricular tachycardia s/p ICD, and HLD. Presents this admission with sepsis secondary to nonpurulent cellulitis of bilateral lower extremities.   Pt endorses poor appetite over the last few months due to unknown reasons. States during this time she attempted to consume meats and grains but tolerated only bites at a time. She drinks Soylent protein drinks (1 daily). Very particular about type of Ensure she likes. RD provided Ensure Enlive at bedside, she did not like. Will trial Boost Plus. Pt to continue bring Soylent from home. Meal completions this admission vary from 25-75%.   Pt unsure of UBW but is sure she's lost weight. Records indicate pt weighed 82.9 kg on 6/28 and 73.9 kg this admission. Suspect dry wt loss but unable to quantify given history of CHF.   Medications: 80 mg lasix daily, SS novolog, remeron, miralax, senokot  Labs: CBG 70-187  NUTRITION - FOCUSED PHYSICAL EXAM:    Most Recent Value  Orbital Region No depletion  Upper Arm Region Mild depletion  Thoracic and Lumbar Region  Unable to assess  Buccal Region Mild depletion  Temple Region Mild depletion  Clavicle Bone Region Moderate depletion  Clavicle and Acromion Bone Region Moderate depletion  Scapular Bone Region Unable to assess  Dorsal Hand No depletion  Patellar Region No depletion  Anterior Thigh Region No depletion  Posterior Calf Region No depletion  Edema (RD Assessment) Severe  [BLE]  Hair Reviewed  Eyes Reviewed  Mouth Reviewed  Skin Reviewed  Nails Reviewed     Diet Order:   Diet Order            Diet regular Room service appropriate? Yes; Fluid consistency: Thin  Diet effective now                 EDUCATION NEEDS:   Education needs have been addressed  Skin:  Skin Assessment: Skin Integrity Issues: Skin Integrity Issues:: Other (Comment) Other: weeping BLE  Last BM:  8/24  Height:   Ht Readings from Last 1 Encounters:  03/18/20 5\' 4"  (1.626 m)    Weight:   Wt Readings from Last 1 Encounters:  03/27/20 73.9 kg    BMI:  Body mass index is 27.97 kg/m.  Estimated Nutritional Needs:   Kcal:  1750-1950 kcal  Protein:  90-105 grams  Fluid:  >/= 1.7 L/day   Mariana Single RD, LDN Clinical Nutrition Pager listed in [place]

## 2020-03-27 NOTE — Progress Notes (Signed)
Patient scheduled today to be seen for next cycle of immunotherapy. She is still hospitalized. Will wait for patient to be discharged and then reschedule this appointment per Dr Antonieta Pert direction.  Oncology Nurse Navigator Documentation  Oncology Nurse Navigator Flowsheets 03/27/2020  Abnormal Finding Date -  Confirmed Diagnosis Date -  Diagnosis Status -  Phase of Treatment -  Chemotherapy Actual Start Date: -  Radiation Actual Start Date: -  Radiation Actual End Date: -  Targeted Therapy Actual Start Date: -  Navigator Follow Up Date: -  Navigator Follow Up Reason: -  Production assistant, radio Encounter Type Appt/Treatment Plan Review  Telephone -  Treatment Initiated Date -  Patient Visit Type MedOnc  Treatment Phase Active Tx  Barriers/Navigation Needs Anxiety;Coordination of Care;Family Concerns;Mobility Issues;Pain;Personal Conflicts;Transportation;Education  Education -  Interventions -  Acuity Level 4-High Needs (Greater Than 4 Barriers Identified)  Referrals -  Coordination of Care -  Education Method -  Support Groups/Services Friends and Family  Time Spent with Patient 50

## 2020-03-27 NOTE — Progress Notes (Signed)
[name] is doing okay this morning.  She only had 1 extra dose of Dilaudid yesterday for the pain.  Cardiology is working hard to try to help with the leg swelling.  She has been seen by orthopedic surgery for her legs and wound care issues.  She is eating okay.  She is not having any nausea.  We made some adjustments with her nausea medications.  She slept well last night.  There has been no fever.  She still has atrial fibrillation.  Heart rate is around 100.  There has been no bleeding.  She is on Eliquis.  Her labs show a BUN of 44 creatinine 1.39.  Her calcium is 6.4 with an albumin of 2.3.  Her white cell count is 7.9.  Hemoglobin 9.6.  Platelet count 276,000.  She might need to have a little bit of extra IV calcium.  She is complaining of some itching.  We will try her on some Atarax.  She was started on Zaroxolyn to help with diuresis.  Her vital signs all look pretty stable.  Temperature 97.7.  Pulse 99.  Blood pressure 124/76.  Her lungs sound clear bilaterally.  I do not hear any wheezes.  Cardiac exam slightly tachycardic and irregular.  Abdomen is soft.  Bowel sounds are present.  There is no guarding or rebound tenderness peer extremities shows the wrappings around her legs.  She still has edema in her lower legs.  Her back exam does show some tenderness to palpation in the left upper back.  Neurological exam is nonfocal.  Hopefully, [name] will be able to go home sometime this week.  Her hemoglobin is going down a little bit.  I noted that her hemoglobin is dropping a little bit.  We may need to check some iron studies on her.  I am sure that with some mild renal insufficiency, she may have a low erythropoietin level.  I appreciate the outstanding care that she is getting from the staff.  Lattie Haw, MD  Philippians 2:11

## 2020-03-27 NOTE — Progress Notes (Signed)
[facility]  PROGRESS NOTE  MARENDA ACCARDI BTD:[id number] DOB: 1945/10/21 DOA: 03/16/2020 PCP: [name], No Pcp Per Admit date - 03/10/2020   Admitting Physician Vernelle Emerald, MD  Outpatient Primary MD for the [name] is [name], No Pcp Per  LOS - 9 Brief Narrative   [name] is a 74 y.o. 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 fib status post ICD, breast cancer with bone mets/Lynch syndrome who presented on 8/14 with several days of shortness of breath, worsening redness of lower legs and weeping lesions for the past 2 weeks in setting of using Derm stone to scrape her dry skin and found to have A. fib with RVR to the 120s and sepsis physiology (white count of 24.5, lactic acid 2.2, Respiratory rate of 21) secondary to bilateral lower extremity cellulitis course complicated by A. fib with RVR, AKI on CKD with hyperkalemia    Subjective  Feels like her appetite is improving. Complains of dry, itching skin. Worried the metoprolol will cause her BP to drop more.   A & P  Sepsis secondary to nonpurulent cellulitis of bilateral lower extremities, resolved.  Unfortunate consequence of exfoliativestone use to skin.  Remains afebrile, white count downtrending, blood cultures unremarkable, sepsis physiology resolved. (Vancomycin 8/14, 8/16 and Meropenem 8/15-8/17) -Continue IV ceftriaxone,completed 7 day course,end date 8/22.   A. fib with RVR, likely exacerbated in setting of sepsis due to above, persists.  Heart rate remains in the 110s to 120s .  [name]'s been unable to tolerate IV amiodarone due to nausea in the past, CCB is not an option with decreased EF, hypotension to prior beta-blockade ([name] declines IV amiodarone or higher p.o. dosing) -Appreciate cardiology recommendations, continue low-dose p.o.  amiodarone 200 mg daily--she has been declining due to worsening nausea  Acute on chronic, systolic/diastolic CHF.  Still quite volume overloaded on exam, some slight improvement in bilateral lower extremity swelling with 2-3+ pitting edema bilateral lower extremities.  In the setting of sepsis on admission and holding home diuretics.  BNP in the 900s on admission -Appreciate heart failure recommendations, continue IV Lasix 80 mg daily, metolazone, trial toprol monitor output and volume status and kidney function -Intolerant to losartan due to hyperkalemia  Hypotension, improving. SBP in 100s ( nadir in 90s a day prior) -continue midodrine per Cardiology recommendations -monitor on trial of toprol for her CHF  Slightly elevated high-sensitivity troponin CAD status post CABG (2018).  Suspect demand ischemia in setting of sepsis and A. fib with RVR Not having any anginal equivalents, does have chronic constant left-sided flank pain for several months related to radiation-continue home Eliquis and statin  Left-sided rib cage pain, found to have worsening pre-existing pathologic fractures in thoracic spine on CT chest (T5 and T8) and development of paraspinal soft tissue soft tissue lumbar spine concerning for worsening malignancy.  Pain control improved after IV Toradol -Continue pain control with Home MS Contin, as needed IV Morphine --optimize bowel regimen   History of VT/V. fib, status post ICD (2016), history of V. fib arrest in 11/2016 -Has not been able to tolerate amiodarone or beta blockade in the past -Currently on amiodarone 20 mg daily  Metastatic breast cancer with bone mets.  Diagnosed 12/2019. -Continue outpatient follow-up with Dr. Marin Olp, on immunotherapy. --Appreciate Dr. Antonieta Pert assistance, he will discuss with radiology  Type 2 diabetes, A1c 7.2. FBG in 60s likely related to poor po intake . She's not required any insulin since yesterday around noon time -Holding home  glipizide -Monitor CBG, sliding scale as needed --Nutrition consulted to assist with diet supplementation  Acute on CKD with hyperkalemia ,cr improving.   Peak creatinine of 2.2, baseline of ~1.6. Required lokelma on 8/20 for recurrent hyperkalemia, now improved at 5.Creatinine of 1.4, back at baseline -Avoid nephrotoxins (had toradol x3 for back pain on 8/19) -Monitor BMP --Monitor output  Hypocalcemia. Even corrected for hypoalbuminemia Ca is 7.2. Phos and Mg wnl. Suspect secondary to Vit D deficiency --check PTH--had to be resent --IV calcium gluconate per pharmacy protocol --monitor Calcium    Family Communication  : None  Code Status : DNR, discussed on day of admission  Disposition Plan  :  [name] is from home. Anticipated d/c date: 2 to 3 days. Barriers to d/c or necessity for inpatient status:  Needs continued IV Lasix for volume status, Consults  : Advanced heart failure, oncology  Procedures  : Venous duplex 8/18  DVT Prophylaxis  : Eliquis Lab Results  Component Value Date   PLT 276 03/27/2020    Diet :  Diet Order            Diet regular Room service appropriate? Yes; Fluid consistency: Thin  Diet effective now                  Inpatient Medications Scheduled Meds: . amiodarone  200 mg Oral Daily  . apixaban  5 mg Oral BID  . exemestane  25 mg Oral QPC breakfast  . furosemide  80 mg Intravenous Daily  . insulin aspart  0-15 Units Subcutaneous TID AC & HS  . lactose free nutrition  237 mL Oral BID BM  . [START ON 03/28/2020] metoprolol succinate  25 mg Oral QHS  . midodrine  2.5 mg Oral BID WC  . mirtazapine  7.5 mg Oral QHS  . morphine  15 mg Oral Q12H  . multivitamin with minerals  1 tablet Oral Daily  . OLANZapine  5 mg Oral QHS  . ondansetron  8 mg Oral Q6H  . polyethylene glycol  17 g Oral BID  . senna  2 tablet Oral BID   Continuous Infusions: . sodium chloride Stopped (03/22/20 1658)  . ondansetron (ZOFRAN) IV 8 mg (03/26/20 1502)   PRN  Meds:.sodium chloride, acetaminophen **OR** acetaminophen, HYDROmorphone (DILAUDID) injection, hydrOXYzine, LORazepam, morphine, sodium chloride  Antibiotics  :   Anti-infectives (From admission, onward)   Start     Dose/Rate Route Frequency Ordered Stop   03/20/20 1430  cefTRIAXone (ROCEPHIN) 2 g in sodium chloride 0.9 % 100 mL IVPB        2 g 200 mL/hr over 30 Minutes Intravenous Every 24 hours 03/20/20 1345 03/25/20 1320   03/20/20 0600  vancomycin (VANCOREADY) IVPB 1250 mg/250 mL  Status:  Discontinued        1,250 mg 166.7 mL/hr over 90 Minutes Intravenous Every 48 hours 03/18/20 0230 03/20/20 1345   03/18/20 0330  vancomycin (VANCOREADY) IVPB 1500 mg/300 mL        1,500 mg 150 mL/hr over 120 Minutes Intravenous  Once 03/18/20 0230 03/18/20 0803   03/18/20 0330  meropenem (MERREM) 1 g in sodium chloride 0.9 % 100 mL IVPB  Status:  Discontinued        1 g 200 mL/hr over 30 Minutes Intravenous 2 times daily 03/18/20 0230 03/20/20 1345  03/18/20 0130  piperacillin-tazobactam (ZOSYN) IVPB 3.375 g  Status:  Discontinued        3.375 g 100 mL/hr over 30 Minutes Intravenous  Once 03/18/20 0115 03/18/20 0214       Objective   Vitals:   03/27/20 0035 03/27/20 0500 03/27/20 0823 03/27/20 1144  BP: (!) 93/53 124/76 110/67 110/73  Pulse: 83 99 100 98  Resp: 18 16 15 20   Temp: (!) 97.5 F (36.4 C) 97.7 F (36.5 C) 97.7 F (36.5 C) (!) 97.5 F (36.4 C)  TempSrc: Oral Oral Oral Oral  SpO2: 95% 96% 95% 96%  Weight:  73.9 kg    Height:        SpO2: 96 % O2 Flow Rate (L/min): 0 L/min  Wt Readings from Last 3 Encounters:  03/27/20 73.9 kg  01/30/20 82.9 kg  12/28/19 82.9 kg     Intake/Output Summary (Last 24 hours) at 03/27/2020 1618 Last data filed at 03/27/2020 0640 Gross per 24 hour  Intake 360 ml  Output 650 ml  Net -290 ml    Physical Exam:     Awake Alert, Oriented X 3, Normal affect No new F.N deficits,  [place].AT, Normal respiratory effort on room air,  crackles at bases Irregularly irregular heart rhythm, tachycardic, 2-3+ pitting edema bilateral lower extremities to knee with compression wrappings in place +ve B.Sounds, Abd Soft, No tenderness, No rebound, guarding or rigidity. Right lateral leg with large area of erythema and weeping lesions Dry skin      I have personally reviewed the following:   Data Reviewed:  CBC Recent Labs  Lab 03/21/20 0449 03/22/20 0954 03/25/20 1110 03/26/20 0225 03/27/20 0412  WBC 12.3* 12.5* 11.4* 9.4 7.9  HGB 11.3* 11.3* 11.5* 10.9* 9.6*  HCT 37.3 37.7 39.3 36.5 31.5*  PLT 235 247 252 243 276  MCV 88.6 88.1 92.7 89.5 89.2  MCH 26.8 26.4 27.1 26.7 27.2  MCHC 30.3 30.0 29.3* 29.9* 30.5  RDW 23.0* 23.2* 23.5* 23.2* 22.7*  LYMPHSABS 0.6*  --   --  0.4* 0.5*  MONOABS 0.7  --   --  0.6 0.7  EOSABS 0.7*  --   --  0.3 0.3  BASOSABS 0.1  --   --  0.1 0.1    Chemistries  Recent Labs  Lab 03/23/20 0245 03/23/20 0245 03/23/20 1127 03/23/20 1505 03/24/20 0714 03/24/20 1229 03/25/20 0312 03/25/20 0724 03/25/20 0816 03/25/20 1110 03/25/20 2124 03/26/20 0225 03/26/20 0730 03/26/20 1220 03/27/20 0412  NA 128*   < > 132*  --  133*  --  132*  --   --   --   --  136  --   --  135  K 6.6*   < > 5.3*   < > 5.0   < > 4.7   < >  --    < > 4.8 5.1 4.6 4.5 4.1  CL 99   < > 98  --  97*  --  99  --   --   --   --  102  --   --  98  CO2 17*   < > 20*  --  22  --  21*  --   --   --   --  22  --   --  25  GLUCOSE 87   < > 110*  --  106*  --  79  --   --   --   --  79  --   --  91  BUN 52*   < > 53*  --  53*  --  53*  --   --   --   --  48*  --   --  44*  CREATININE 1.72*   < > 1.80*  --  1.87*  --  1.54*  --   --   --   --  1.44*  --   --  1.39*  CALCIUM 6.3*   < > 6.6*   < > 6.8*  --  6.2*  --  6.6*  --   --  6.5*  --   --  6.4*  MG  --   --   --   --   --   --   --   --  2.2  --   --   --   --   --  2.1  AST 36  --   --   --   --   --   --   --  28  --   --  27  --   --  21  ALT 23  --   --   --    --   --   --   --  24  --   --  23  --   --  19  ALKPHOS 126  --   --   --   --   --   --   --  105  --   --  98  --   --  88  BILITOT 2.1*  --   --   --   --   --   --   --  1.0  --   --  1.0  --   --  1.1   < > = values in this interval not displayed.   ------------------------------------------------------------------------------------------------------------------ No results for input(s): CHOL, HDL, LDLCALC, TRIG, CHOLHDL, LDLDIRECT in the last 72 hours.  Lab Results  Component Value Date   HGBA1C 7.2 (H) 03/18/2020   ------------------------------------------------------------------------------------------------------------------ No results for input(s): TSH, T4TOTAL, T3FREE, THYROIDAB in the last 72 hours.  Invalid input(s): FREET3 ------------------------------------------------------------------------------------------------------------------ Recent Labs    03/27/20 0815  FERRITIN 123  TIBC 300  IRON 38    Coagulation profile No results for input(s): INR, PROTIME in the last 168 hours.  No results for input(s): DDIMER in the last 72 hours.  Cardiac Enzymes No results for input(s): CKMB, TROPONINI, MYOGLOBIN in the last 168 hours.  Invalid input(s): CK ------------------------------------------------------------------------------------------------------------------    Component Value Date/Time   BNP 912.0 (H) 03/18/2020 0229   BNP 398.6 (H) 09/13/2015 1158    Micro Results Recent Results (from the past 240 hour(s))  Culture, blood (routine x 2)     Status: None   Collection Time: 03/18/20  2:25 AM   Specimen: BLOOD  Result Value Ref Range Status   Specimen Description BLOOD RIGHT HAND  Final   Special Requests   Final    BOTTLES DRAWN AEROBIC AND ANAEROBIC Blood Culture results may not be optimal due to an excessive volume of blood received in culture bottles   Culture   Final    NO GROWTH 5 DAYS Performed at [facility], [place] [address]  [place], [place] [address]    Report Status 03/23/2020 FINAL  Final  SARS Coronavirus 2 by RT PCR (hospital order, performed in [facility] hospital lab) Nasopharyngeal Nasopharyngeal Swab     Status: None  Collection Time: 03/18/20  2:27 AM   Specimen: Nasopharyngeal Swab  Result Value Ref Range Status   SARS Coronavirus 2 NEGATIVE NEGATIVE Final    Comment: (NOTE) SARS-CoV-2 target nucleic acids are NOT DETECTED.  The SARS-CoV-2 RNA is generally detectable in upper and lower respiratory specimens during the acute phase of infection. The lowest concentration of SARS-CoV-2 viral copies this assay can detect is 250 copies / mL. A negative result does not preclude SARS-CoV-2 infection and should not be used as the sole basis for treatment or other [name] management decisions.  A negative result may occur with improper specimen collection / handling, submission of specimen other than nasopharyngeal swab, presence of viral mutation(s) within the areas targeted by this assay, and inadequate number of viral copies (<250 copies / mL). A negative result must be combined with clinical observations, [name] history, and epidemiological information.  Fact Sheet for Patients:   StrictlyIdeas.no  Fact Sheet for Healthcare Providers: BankingDealers.co.za  This test is not yet approved or  cleared by the [place] FDA and has been authorized for detection and/or diagnosis of SARS-CoV-2 by FDA under an Emergency Use Authorization (EUA).  This EUA will remain in effect (meaning this test can be used) for the duration of the COVID-19 declaration under Section 564(b)(1) of the Act, 21 U.S.C. section 360bbb-3(b)(1), unless the authorization is terminated or revoked sooner.  Performed at [facility], [place] [address], [place], [place] [address]   Culture, blood (routine x 2)     Status: None   Collection Time: 03/18/20  2:29 AM   Specimen: BLOOD   Result Value Ref Range Status   Specimen Description BLOOD RIGHT ARM  Final   Special Requests   Final    BOTTLES DRAWN AEROBIC ONLY Blood Culture adequate volume   Culture   Final    NO GROWTH 5 DAYS Performed at [facility], [address], [place], [place] [address]    Report Status 03/23/2020 FINAL  Final  MRSA PCR Screening     Status: None   Collection Time: 03/18/20  6:13 AM   Specimen: Nasopharyngeal  Result Value Ref Range Status   MRSA by PCR NEGATIVE NEGATIVE Final    Comment:        The GeneXpert MRSA Assay (FDA approved for NASAL specimens only), is one component of a comprehensive MRSA colonization surveillance program. It is not intended to diagnose MRSA infection nor to guide or monitor treatment for MRSA infections. Performed at [facility], [place] [address], [place], [place] [address]     Radiology Reports DG Chest 2 View  Result Date: 03/08/2020 CLINICAL DATA:  Chest pain EXAM: CHEST - 2 VIEW COMPARISON:  12/04/2019, PET CT 12/22/2019 FINDINGS: Post sternotomy changes with left atrial appendage clip. Left-sided pacing device as before. Cardiomegaly with mildly enlarged central pulmonary vessels. No overt edema. No consolidation, pleural effusion, or pneumothorax. Moderate compression deformity of mid to lower thoracic vertebra, new or increased as compared to chest CT from May 2021. IMPRESSION: Cardiomegaly without overt failure. New mild to moderate compression deformity of mid to lower thoracic vertebra. Electronically Signed   By: Donavan Foil M.D.   On: 04/03/2020 20:18   CT CHEST WO CONTRAST  Addendum Date: 03/22/2020   ADDENDUM REPORT: 03/22/2020 09:53 ADDENDUM: Spine fractures with 2 column involvement at T5 and T8 were discussed along with worsening spinal alignment and paraspinal soft tissue along the upper thoracic spine were discussed with the provider  is outlined below. MRI could be considered if cardiac device will allow. These results  were called by telephone at the time of interpretation on 03/22/2020 at 9:53 am to provider [facility] , who verbally acknowledged these results. Electronically Signed   By: Zetta Bills M.D.   On: 03/22/2020 09:53   Result Date: 03/22/2020 CLINICAL DATA:  Breast cancer, surveillance of metastatic disease, significant pain reported to LEFT lateral chest. EXAM: CT CHEST WITHOUT CONTRAST TECHNIQUE: Multidetector CT imaging of the chest was performed following the standard protocol without IV contrast. COMPARISON:  Dec 04, 2019 and PET evaluation of Dec 22, 2019 FINDINGS: Cardiovascular: Calcified atheromatous plaque of the thoracic aorta. No aneurysmal dilation. Post median sternotomy for CABG with unchanged cardiac enlargement. Mitral annular calcification and cardiac pacer defibrillator and LEFT atrial clipping as before. Engorgement of central pulmonary vasculature with similar appearance. Limited assessment of vascular structures in the chest due to lack of intravenous contrast. Mediastinum/Nodes: No adenopathy at the thoracic inlet. No adenopathy in the axilla. No mediastinal lymphadenopathy. Esophagus mildly patulous. Lungs/Pleura: No pleural effusion. Interstitial thickening at the lung apices is persistent and somewhat irregular. For instance on image 29 of series 4 there is more profound interstitial thickening with a bandlike appearance changes are seen in the medial chest with similar appearance. In the RIGHT upper lobe a bandlike area extends from the hilum into the RIGHT upper lobe. Some scarring along the RIGHT heart border with mild bronchial dilation with slight increase since the previous study. No discrete mass. Upper Abdomen: Incidental imaging of upper abdominal contents show no acute process to the extent evaluated. Adrenal glands are normal. Musculoskeletal: Spinous process fracture incidentally noted at C7, incompletely imaged. Slight interval decrease in height of the T1 vertebral body  anteriorly compatible with mild compression approximately 10%. This was likely present previously. Interval development of pathologic fractures of the thoracic spine since previous imaging with worsening soft tissue about the upper thoracic spine at the level of T2, T3 and T4 with pathologic fractures at all these levels. T2 with minimal loss of height and T3 and T4 with approximately 20% loss of height. Also with approximately 50% loss of height at the T5 level worse on the LEFT with resultant dextroconvexity of the spine at this location that was mild previously and is now moderate in terms of curvature. The compression fractures present at T4-T5 on the prior study have worsened in the interval. Interval approximately 40-50% loss of height of the T8 vertebral body. Fracture at this level and at the T5 level displaying signs of posterior cortical involvement of the vertebral body. Only very minimal retropulsion is noted at these levels. Interval 20% loss of height of the L1 vertebral body since the prior study. Diffuse heterogeneity of visualized bones in this [name] with known widespread bony metastases. LEFT breast mass may be slightly smaller than on the previous study measuring approximately 3.6 x 1.3 cm. Greatest thickness on the prior study was approximately 2.4 cm. IMPRESSION: 1. Worsening of pre-existing pathologic fractures in the thoracic spine with new T8 compression and new L1 compression as described. There is mild posterior cortical retropulsion at the T5 and T8 levels. 2. Development of soft tissue along lateral margin of the spine likely related to worsening malignancy. MRI of the spine could be helpful as clinically warranted for further assessment. 3. Septal thickening at the lung apices could be a chronic finding but could also be seen in the setting of lymphangitic carcinomatosis. Attention on follow-up.  4. Breast mass may be slightly smaller than on the previous study. 5. Similar appearance of  central pulmonary arterial enlargement likely related to underlying pulmonary arterial hypertension. 6. Signs of cardiac disease as before and changes of median sternotomy for CABG. A call is out to the referring provider to further discuss findings in the above case. Aortic Atherosclerosis (ICD10-I70.0). Electronically Signed: By: Zetta Bills M.D. On: 03/22/2020 09:42   VAS [place] LOWER EXTREMITY VENOUS (DVT)  Result Date: 03/20/2020  Lower Venous DVTStudy Indications: Edema.  Risk Factors: Cancer Breast. Limitations: Bandages and body habitus. Comparison Study: Performing Technologist: Antonieta Pert RDMS, RVT  Examination Guidelines: A complete evaluation includes B-mode imaging, spectral Doppler, color Doppler, and power Doppler as needed of all accessible portions of each vessel. Bilateral testing is considered an integral part of a complete examination. Limited examinations for reoccurring indications may be performed as noted. The reflux portion of the exam is performed with the [name] in reverse Trendelenburg.  +---------+---------------+---------+-----------+----------+--------------+ RIGHT    CompressibilityPhasicitySpontaneityPropertiesThrombus Aging +---------+---------------+---------+-----------+----------+--------------+ CFV      Full           Yes      Yes                                 +---------+---------------+---------+-----------+----------+--------------+ SFJ      Full                                                        +---------+---------------+---------+-----------+----------+--------------+ FV Prox  Full                                                        +---------+---------------+---------+-----------+----------+--------------+ FV Mid   Full                                                        +---------+---------------+---------+-----------+----------+--------------+ FV DistalFull                                                         +---------+---------------+---------+-----------+----------+--------------+ PFV      Full                                                        +---------+---------------+---------+-----------+----------+--------------+ POP      Full           Yes      Yes                                 +---------+---------------+---------+-----------+----------+--------------+ PTV  Not visualized +---------+---------------+---------+-----------+----------+--------------+ PERO                                                  Not visualized +---------+---------------+---------+-----------+----------+--------------+ GSV      Full                                                        +---------+---------------+---------+-----------+----------+--------------+   +---------+---------------+---------+-----------+----------+--------------+ LEFT     CompressibilityPhasicitySpontaneityPropertiesThrombus Aging +---------+---------------+---------+-----------+----------+--------------+ CFV      Full                                                        +---------+---------------+---------+-----------+----------+--------------+ SFJ      Full                                                        +---------+---------------+---------+-----------+----------+--------------+ FV Prox  Full                                                        +---------+---------------+---------+-----------+----------+--------------+ FV Mid   Full                                                        +---------+---------------+---------+-----------+----------+--------------+ FV DistalFull                                                        +---------+---------------+---------+-----------+----------+--------------+ PFV      Full                                                         +---------+---------------+---------+-----------+----------+--------------+ POP      Full                                                        +---------+---------------+---------+-----------+----------+--------------+ PTV  Not visualized +---------+---------------+---------+-----------+----------+--------------+ PERO                                                  Not visualized +---------+---------------+---------+-----------+----------+--------------+ GSV      Full                                                        +---------+---------------+---------+-----------+----------+--------------+ hypoechoic fluid collection posterior left popliteal fossa 3.9cm    Summary: RIGHT: - There is no evidence of deep vein thrombosis in the lower extremity. However, portions of this examination were limited- see technologist comments above.  - No cystic structure found in the popliteal fossa. - calf veins not visulaized due to bandages  LEFT: - There is no evidence of deep vein thrombosis in the lower extremity. However, portions of this examination were limited- see technologist comments above.  - A cystic structure is found in the popliteal fossa. - calf veins not visulaized due to bandages  *See table(s) above for measurements and observations. Electronically signed by Deitra Mayo MD on 03/20/2020 at 5:10:34 PM.    Final      Time Spent in minutes  30     Desiree Hane M.D on 03/27/2020 at 4:18 PM  To page go to [email or web address] - password [facility]

## 2020-03-27 NOTE — Progress Notes (Addendum)
Advanced Heart Failure Rounding Note  PCP-Cardiologist: No primary care provider on file.     Patient Profile   74 y/o woman with recently diagnosed metastatic breast CA (undergoing XRT), CAD s/p CABG, DM2, systolic HF with EF 42-68% and PAF.   Admitted with sepsis and lactic acidosis due to severe LE cellulitis in setting of marked volume overload. Course complicated by AF with RVR.   Subjective:    SCr improving, 1.87>>1.54>>1.44>>1.4  Yesterday diuresed with IV lasix + metolazone. Also started on midodrine. Weight down 1 pound.  Having ongoing back pain. Denies SOB.     Objective:   Weight Range: 73.9 kg Body mass index is 27.97 kg/m.   Vital Signs:   Temp:  [97.5 F (36.4 C)-97.7 F (36.5 C)] 97.7 F (36.5 C) (08/24 0500) Pulse Rate:  [83-109] 99 (08/24 0500) Resp:  [14-20] 16 (08/24 0500) BP: (93-128)/(53-81) 124/76 (08/24 0500) SpO2:  [95 %-98 %] 96 % (08/24 0500) Weight:  [73.9 kg] 73.9 kg (08/24 0500) Last BM Date: 03/23/20  Weight change: Filed Weights   03/25/20 0601 03/26/20 0423 03/27/20 0500  Weight: 73.9 kg 73.7 kg 73.9 kg    Intake/Output:   Intake/Output Summary (Last 24 hours) at 03/27/2020 0748 Last data filed at 03/27/2020 0640 Gross per 24 hour  Intake 360 ml  Output 650 ml  Net -290 ml      Physical Exam  General: No resp difficulty HEENT: normal Neck: supple. JVP 9-10 . Carotids 2+ bilat; no bruits. No lymphadenopathy or thryomegaly appreciated. Cor: PMI nondisplaced. Irregular rate & rhythm. No rubs, gallops or murmurs. Lungs: clear Abdomen: soft, nontender, nondistended. No hepatosplenomegaly. No bruits or masses. Good bowel sounds. Extremities: no cyanosis, clubbing, rash, R and LLE unna boots. 1+ edema in bilateral thighs.  Neuro: alert & orientedx3, cranial nerves grossly intact. moves all 4 extremities w/o difficulty. Affect pleasant   Telemetry   Afib 100-110s   EKG    No new EKG to review   Labs     CBC Recent Labs    03/26/20 0225 03/27/20 0412  WBC 9.4 7.9  NEUTROABS 7.9* 6.2  HGB 10.9* 9.6*  HCT 36.5 31.5*  MCV 89.5 89.2  PLT 243 341   Basic Metabolic Panel Recent Labs    03/25/20 0816 03/25/20 1110 03/26/20 0225 03/26/20 0730 03/26/20 1220 03/27/20 0412  NA  --   --  136  --   --  135  K  --    < > 5.1   < > 4.5 4.1  CL  --   --  102  --   --  98  CO2  --   --  22  --   --  25  GLUCOSE  --   --  79  --   --  91  BUN  --   --  48*  --   --  44*  CREATININE  --   --  1.44*  --   --  1.39*  CALCIUM 6.6*  --  6.5*  --   --  6.4*  MG 2.2  --   --   --   --  2.1  PHOS 3.3  --   --   --   --   --    < > = values in this interval not displayed.   Liver Function Tests Recent Labs    03/26/20 0225 03/27/20 0412  AST 27 21  ALT 23 19  ALKPHOS  98 88  BILITOT 1.0 1.1  PROT 6.1* 5.5*  ALBUMIN 2.6* 2.3*   No results for input(s): LIPASE, AMYLASE in the last 72 hours. Cardiac Enzymes No results for input(s): CKTOTAL, CKMB, CKMBINDEX, TROPONINI in the last 72 hours.  BNP: BNP (last 3 results) Recent Labs    12/04/19 1324 03/18/20 0229  BNP 581.1* 912.0*    ProBNP (last 3 results) No results for input(s): PROBNP in the last 8760 hours.   D-Dimer No results for input(s): DDIMER in the last 72 hours. Hemoglobin A1C No results for input(s): HGBA1C in the last 72 hours. Fasting Lipid Panel No results for input(s): CHOL, HDL, LDLCALC, TRIG, CHOLHDL, LDLDIRECT in the last 72 hours. Thyroid Function Tests No results for input(s): TSH, T4TOTAL, T3FREE, THYROIDAB in the last 72 hours.  Invalid input(s): FREET3  Other results:   Imaging    No results found.   Medications:     Scheduled Medications: . amiodarone  200 mg Oral Daily  . apixaban  5 mg Oral BID  . exemestane  25 mg Oral QPC breakfast  . furosemide  80 mg Intravenous Daily  . insulin aspart  0-15 Units Subcutaneous TID AC & HS  . midodrine  2.5 mg Oral BID WC  . mirtazapine  7.5 mg  Oral QHS  . morphine  15 mg Oral Q12H  . OLANZapine  5 mg Oral QHS  . ondansetron  8 mg Oral Q6H  . polyethylene glycol  17 g Oral BID  . senna  2 tablet Oral BID    Infusions: . sodium chloride Stopped (03/22/20 1658)  . ondansetron [facility]) IV 8 mg (03/26/20 1502)    PRN Medications: sodium chloride, acetaminophen **OR** acetaminophen, HYDROmorphone (DILAUDID) injection, hydrOXYzine, LORazepam, morphine, sodium chloride   Assessment/Plan    1. Atrial Fibrillation with RVR - S/p MAZE and LAA clipping at time of CABG in 11/2016. S/p DCCV in 09/2015 and more recently 12/09/2019. Noted to be back in atrial fibrillation at last office visit on 01/30/2020 and started on low dose PO Amiodarone. - Presented in atrial fibrillation with RVR in the setting of sepsis secondary to cellulitis of bilateral lower extremities. Difficult situation complicated by patient's intolerance to multiple cardiac meds.  - Rate remains in 100-110s  - Not been able to tolerate IV Amiodarone due to severe nausea. - Not able to tolerate beta-blocker due to hypotension with syncope. - Calcium channel blockers not a good option with reduced EF. - Digoxin not a good option with current AKI.  - continue to treat w/ low dose PO amio, 200 mg daily + treatment of acute infection  - - Continue Eliquis 5mg  twice daily.    2. Acute on Chronic Combined CHF - History of ischemic cardiomyopathy. PYP in the past negative for amyloid. - Most recent Echo in 12/2019 showed LVEF of 35-40% with severe asymmetric LVH, mild MR, moderate TR, and severely elevated PASP. - Chest x-ray showed cardiomegaly with mildly enlarged central pulmonary vessel but no overt edema. BNP 912 - Home Lasix held on admit and patient actually given some IV fluids on admission due to sepsis/ AKI. Diuretics restarted 8/17.  - Volume status slowly improving. Continue IV lasix and give metolazone.  - intolerant to Losartan due to hyperkalemia   3. CAD  -  S/p CABG x4 in 11/2016.  - High-sensitivity troponin minimally elevated and flat at 22 >> 24. Not consistent with ACS. Suspect demand ischemia in setting of sepsis and atrial fibrillation with RVR. -  She reports constant left sided flank/breast pain that is worse with inspiration for the last several months. Sounds like this may be secondary to radiation. Nothing that sounds like angina. - No Aspirin with need for Eliquis.  - Continue statin.  4. 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 Had VF arrest in 11/2016 with appropriate ICD shock.  - No recurrent VT/VF.  - Had not been able to tolerate Amiodarone or beta-blocker in the past.  5. Sepsis Secondary to Bilateral Lower Extremity Cellulitis - initially placed on broad spectrum abx w/ vanc + meropenum. AF. Lactic acid normalized.  Blood cultures NGTD  - has completed course of abx, AF.  - WBC down to  7.9   6. 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 - Diagnosed in 12/2019. Lynch syndrome diagnosed by genetic study.  - Follows with Dr. Marin Olp as outpatient. On immunotherapy   7. Hyperkalemia - required lokelma this admit  - K stable today at 4.1  8. Type 2 Diabetes Mellitus - Management per primary team.  9. Acute on Chronic Kidney Disease - baseline SCr 1.0-1.30 range  - Creatinine 2.20 on admission, sepsis and deceased PO intake with constant nausea and frequent vomiting -Creatinine stable 1.4  -Follow BMET daily  Length of Stay: [place], NP  03/27/2020, 7:48 AM   Advanced Heart Failure Team Pager [phone] (M-F; 7a - 4p)  Please contact [facility] for night-coverage after hours (4p -7a ) and weekends on [email or web address]  Patient seen and examined with the above-signed Advanced Practice Provider and/or Housestaff. I personally reviewed laboratory data, imaging studies and relevant notes. I independently examined the patient and formulated the important aspects of the plan. I have edited the note  to reflect any of my changes or salient points. I have personally discussed the plan with the patient and/or family.  Volume status slowly improving with IV lasix, metolazone and UNNA boots. Remains in AF with RVR. BP up with midodrine  General:  Sitting up on side of bed No resp difficulty HEENT: normal Neck: supple. JVP to jaw. Carotids 2+ bilat; no bruits. No lymphadenopathy or thryomegaly appreciated. Cor: PMI nondisplaced. Irregular rate tachy Lungs: clear Abdomen: soft, nontender, nondistended. No hepatosplenomegaly. No bruits or masses. Good bowel sounds. Extremities: no cyanosis, clubbing, rash, 2+ edema + UNNA Neuro: alert & orientedx3, cranial nerves grossly intact. moves all 4 extremities w/o difficulty. Affect pleasant  Volume status getting better slowly. Renal function stable, Continue IV lasix, metolazone and unna boots. HR remains fast. BP up with midodrine. Will try low dose b-blocker.   Glori Bickers, MD  11:00 AM

## 2020-03-28 LAB — CBC WITH DIFFERENTIAL/PLATELET
Abs Immature Granulocytes: 0.06 10*3/uL (ref 0.00–0.07)
Basophils Absolute: 0.1 10*3/uL (ref 0.0–0.1)
Basophils Relative: 1 %
Eosinophils Absolute: 0.5 10*3/uL (ref 0.0–0.5)
Eosinophils Relative: 5 %
HCT: 32.8 % — ABNORMAL LOW (ref 36.0–46.0)
Hemoglobin: 9.9 g/dL — ABNORMAL LOW (ref 12.0–15.0)
Immature Granulocytes: 1 %
Lymphocytes Relative: 7 %
Lymphs Abs: 0.7 10*3/uL (ref 0.7–4.0)
MCH: 26.8 pg (ref 26.0–34.0)
MCHC: 30.2 g/dL (ref 30.0–36.0)
MCV: 88.9 fL (ref 80.0–100.0)
Monocytes Absolute: 0.8 10*3/uL (ref 0.1–1.0)
Monocytes Relative: 9 %
Neutro Abs: 6.8 10*3/uL (ref 1.7–7.7)
Neutrophils Relative %: 77 %
Platelets: 326 10*3/uL (ref 150–400)
RBC: 3.69 MIL/uL — ABNORMAL LOW (ref 3.87–5.11)
RDW: 22.5 % — ABNORMAL HIGH (ref 11.5–15.5)
WBC: 8.9 10*3/uL (ref 4.0–10.5)
nRBC: 0 % (ref 0.0–0.2)

## 2020-03-28 LAB — COMPREHENSIVE METABOLIC PANEL
ALT: 21 U/L (ref 0–44)
AST: 23 U/L (ref 15–41)
Albumin: 2.5 g/dL — ABNORMAL LOW (ref 3.5–5.0)
Alkaline Phosphatase: 94 U/L (ref 38–126)
Anion gap: 11 (ref 5–15)
BUN: 42 mg/dL — ABNORMAL HIGH (ref 8–23)
CO2: 24 mmol/L (ref 22–32)
Calcium: 6.8 mg/dL — ABNORMAL LOW (ref 8.9–10.3)
Chloride: 97 mmol/L — ABNORMAL LOW (ref 98–111)
Creatinine, Ser: 1.62 mg/dL — ABNORMAL HIGH (ref 0.44–1.00)
GFR calc Af Amer: 36 mL/min — ABNORMAL LOW (ref >60)
GFR calc non Af Amer: 31 mL/min — ABNORMAL LOW (ref >60)
Glucose, Bld: 99 mg/dL (ref 70–99)
Potassium: 4.3 mmol/L (ref 3.5–5.1)
Sodium: 132 mmol/L — ABNORMAL LOW (ref 135–145)
Total Bilirubin: 1.1 mg/dL (ref 0.3–1.2)
Total Protein: 5.4 g/dL — ABNORMAL LOW (ref 6.5–8.1)

## 2020-03-28 LAB — GLUCOSE, CAPILLARY
Glucose-Capillary: 73 mg/dL (ref 70–99)
Glucose-Capillary: 155 mg/dL — ABNORMAL HIGH (ref 70–99)
Glucose-Capillary: 166 mg/dL — ABNORMAL HIGH (ref 70–99)
Glucose-Capillary: 97 mg/dL (ref 70–99)

## 2020-03-28 LAB — PTH, INTACT AND CALCIUM
Calcium, Total (PTH): 6.9 mg/dL — CL (ref 8.7–10.3)
PTH: 432 pg/mL — ABNORMAL HIGH (ref 15–65)

## 2020-03-28 NOTE — Progress Notes (Signed)
PROGRESS NOTE    [name]  UKG:[id number] DOB: 02-15-1946 DOA: 03/05/2020 PCP: Patient, No Pcp Per    Brief Narrative:  [name] is a 74 y.o. 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 fib status post ICD, breast cancer with bone mets/Lynch syndrome who presented on 8/14 with several days of shortness of breath, worsening redness of lower legs and weeping lesions for the past 2 weeks in setting of using Derm stone to scrape her dry skin and found to have A. fib with RVR to the 120s and sepsis physiology (white count of 24.5, lactic acid 2.2, Respiratory rate of 21) secondary to bilateral lower extremity cellulitis course complicated by A. 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   And to the ED with tachycardia, tachypnea, leukocytosis in the setting of extensive left thigh cellulitis with associated AKI.  Patient was initially started on empiric antibiotics with vancomycin and meropenem which was transitioned to ceftriaxone in which she completed a 7-day course ending on 03/25/2020.  A. fib with RVR Likely exacerbated in setting of sepsis due to  above, persists.  Heart rate remains in the 110s to 120s .  Patient's been unable to tolerate IV amiodarone due to nausea in the past, CCB is not an option with decreased EF, hypotension to prior beta-blockade (patient declines IV amiodarone or higher p.o. dosing) --Cardiology following, appreciate assistance --Continue low-dose amiodarone 200 mg p.o. daily --Metoprolol succinate 25 mg p.o. daily --Continue Eliquis 5 mg p.o. twice daily --Continue monitor on telemetry  Acute on chronic, systolic/diastolic CHF.   Still quite volume overloaded on exam, some slight improvement in bilateral lower extremity swelling with 2-3+ pitting edema bilateral lower extremities. BNP in the 900s on admission.  Intolerant to losartan in the past due to hyperkalemia. --Cardiology/heart failure service following, appreciate assistance --Continue furosemide 80 mg IV daily --Cardiology holding metolazone today for slight bump in creatinine --Continue Unna boots --Continue monitor strict I's and O's and daily weights  Hypotension BP 96/62 this morning --continue midodrine 2.5 mg BID per Cardiology recommendations --monitor closely while initiated on Toprol-XL  Slightly elevated high-sensitivity troponin CAD status post CABG (2018).  Suspect demand ischemia in setting of sepsis and A. fib with RVR Not having any anginal equivalents, does have chronic constant left-sided flank pain for several months related to radiation -continue home Eliquis and statin  Left-sided rib cage pain, found to have worsening pre-existing pathologic fractures in thoracic spine on CT chest (T5 and T8) and development of paraspinal soft tissue soft tissue lumbar spine concerning for worsening malignancy.   --Pain control improved after IV Toradol --Continue pain control with Home MS Contin, as needed IV Morphine --  optimize bowel regimen   History of VT/V. fib, status post ICD (2016), history of V. fib arrest in 11/2016 -Has not  been able to tolerate amiodarone or beta blockade in the past -Currently on amiodarone 200 mg daily --Continue to monitor on telemetry  Metastatic breast cancer with bone mets.  Diagnosed 12/2019. --Continue outpatient follow-up with Dr. Marin Olp, on immunotherapy. --Appreciate Dr. Antonieta Pert assistance, he will discuss with radiology   Type 2 diabetes Hemoglobin A1c A1c 7.2.  -Holding home glipizide -Monitor CBG, sliding scale as needed --Nutrition consulted to assist with diet supplementation  Acute on CKD with hyperkalemia    Peak creatinine of 2.2, baseline of ~1.6. Required lokelma on 8/20 for recurrent hyperkalemia, now improved at 4.3. --Cr 2.2>1.9>1.69>1.8>1.54>1.44>1.39>1.62 --Avoid nephrotoxins (had toradol x3 for back pain on 8/19) --Monitor BMP daily --Continue to monitor urinary output closely  Hypocalcemia.  Corrected for hypoalbuminemia Ca is 7.2. Phos and Mg wnl. Suspect secondary to Vit D deficiency --check PTH--had to be resent --IV calcium gluconate per pharmacy protocol --monitor Calcium   DVT prophylaxis: Eliquis Code Status: DNR Family Communication: None present at bedside  Disposition Plan:  Status is: Inpatient  Remains inpatient appropriate because:Ongoing diagnostic testing needed not appropriate for outpatient work up, Unsafe d/c plan, IV treatments appropriate due to intensity of illness or inability to take PO and Inpatient level of care appropriate due to severity of illness   Dispo: The patient is from: Home              Anticipated d/c is to: Home              Anticipated d/c date is: 2 days              Patient currently is not medically stable to d/c.   Consultants:   Cardiology, heart failure service  Procedures:   Venous duplex ultrasound lower extremities 03/21/2020  Antimicrobials:   Vancomycin 8/15 - 8/17  Meropenem 8/15 - 8/17  Ceftriaxone 8/17 - 8/22   Subjective: Patient seen and examined bedside, resting  comfortably.  Eating breakfast at edge of bed.  Shortness of breath feels improved.  Continues with lower extremity edema.  Unna boots in place.  No other questions or concerns at this time.  Denies headache, no fever/chills/night sweats, no nausea/vomiting/diarrhea, no chest pain, no palpitations, no abdominal pain.  No acute events overnight per nursing staff.  Objective: Vitals:   03/28/20 0506 03/28/20 0508 03/28/20 0747 03/28/20 1124  BP: 93/62 96/62 104/78 98/79  Pulse: 88  87 65  Resp: 14  14 20   Temp: 97.9 F (36.6 C)  97.9 F (36.6 C) 97.8 F (36.6 C)  TempSrc: Oral  Oral Oral  SpO2: 96%  95% 94%  Weight: 74.9 kg     Height:        Intake/Output Summary (Last 24 hours) at 03/28/2020 1144 Last data filed at 03/27/2020 2200 Gross per 24 hour  Intake 120 ml  Output --  Net 120 ml   Filed Weights   03/26/20 0423 03/27/20 0500 03/28/20 0506  Weight: 73.7 kg 73.9 kg 74.9 kg    Examination:  General exam: Appears calm and comfortable  Respiratory system: Clear to auscultation. Respiratory effort normal. Cardiovascular system: S1 & S2 heard, RRR. No JVD, murmurs, rubs, gallops or clicks. No pedal edema. Gastrointestinal system: Abdomen is nondistended, soft and nontender. No organomegaly or masses felt. Normal bowel sounds heard. Central nervous system: Alert and oriented. No focal neurological deficits. Extremities: Symmetric  5 x 5 power. Skin: No rashes, lesions or ulcers Psychiatry: Judgement and insight appear normal. Mood & affect appropriate.     Data Reviewed: I have personally reviewed following labs and imaging studies  CBC: Recent Labs  Lab 03/22/20 0954 03/25/20 1110 03/26/20 0225 03/27/20 0412 03/28/20 0310  WBC 12.5* 11.4* 9.4 7.9 8.9  NEUTROABS  --   --  7.9* 6.2 6.8  HGB 11.3* 11.5* 10.9* 9.6* 9.9*  HCT 37.7 39.3 36.5 31.5* 32.8*  MCV 88.1 92.7 89.5 89.2 88.9  PLT 247 252 243 276 371   Basic Metabolic Panel: Recent Labs  Lab 03/24/20 0714  03/24/20 1229 03/25/20 0312 03/25/20 0724 03/25/20 0816 03/25/20 1110 03/26/20 0225 03/26/20 0730 03/26/20 1220 03/27/20 0412 03/27/20 0815 03/28/20 0310  NA 133*  --  132*  --   --   --  136  --   --  135  --  132*  K 5.0   < > 4.7   < >  --    < > 5.1 4.6 4.5 4.1  --  4.3  CL 97*  --  99  --   --   --  102  --   --  98  --  97*  CO2 22  --  21*  --   --   --  22  --   --  25  --  24  GLUCOSE 106*  --  79  --   --   --  79  --   --  91  --  99  BUN 53*  --  53*  --   --   --  48*  --   --  44*  --  42*  CREATININE 1.87*  --  1.54*  --   --   --  1.44*  --   --  1.39*  --  1.62*  CALCIUM 6.8*  --  6.2*  --  6.6*  --  6.5*  --   --  6.4* 6.9* 6.8*  MG  --   --   --   --  2.2  --   --   --   --  2.1  --   --   PHOS  --   --   --   --  3.3  --   --   --   --   --   --   --    < > = values in this interval not displayed.   GFR: Estimated Creatinine Clearance: 31.1 mL/min (A) (by C-G formula based on SCr of 1.62 mg/dL (H)). Liver Function Tests: Recent Labs  Lab 03/23/20 0245 03/25/20 0816 03/26/20 0225 03/27/20 0412 03/28/20 0310  AST 36 28 27 21 23   ALT 23 24 23 19 21   ALKPHOS 126 105 98 88 94  BILITOT 2.1* 1.0 1.0 1.1 1.1  PROT 6.6 6.4* 6.1* 5.5* 5.4*  ALBUMIN 2.8* 2.6* 2.6* 2.3* 2.5*   No results for input(s): LIPASE, AMYLASE in the last 168 hours. No results for input(s): AMMONIA in the last 168 hours. Coagulation Profile: No results for input(s): INR, PROTIME in the last 168 hours. Cardiac Enzymes: No results for input(s): CKTOTAL, CKMB, CKMBINDEX, TROPONINI in the last 168 hours. BNP (last 3 results) No results for input(s): PROBNP in the last 8760 hours. HbA1C: No results for input(s): HGBA1C in the last 72 hours. CBG: Recent Labs  Lab 03/27/20 1142 03/27/20 1631 03/27/20 2107 03/28/20 0626 03/28/20 1122  GLUCAP 137* 103* 96 73 155*   Lipid Profile: No results for input(s): CHOL, HDL, LDLCALC, TRIG, CHOLHDL, LDLDIRECT in the last 72 hours. Thyroid  Function Tests: No results for input(s): TSH, T4TOTAL, FREET4, T3FREE, THYROIDAB in the last 72 hours. Anemia Panel: Recent Labs    03/27/20 0815  FERRITIN 123  TIBC 300  IRON 38   Sepsis Labs: No results for input(s): PROCALCITON, LATICACIDVEN in the last 168 hours.  No results found for this or any previous visit (from the past 240 hour(s)).       Radiology Studies: No results found.      Scheduled Meds: . amiodarone  200 mg Oral Daily  . apixaban  5 mg Oral BID  . exemestane  25 mg Oral QPC breakfast  . furosemide  80 mg Intravenous Daily  . insulin aspart  0-15 Units Subcutaneous TID AC & HS  . lactose free nutrition  237 mL Oral BID BM  . metoprolol succinate  25 mg Oral QHS  . midodrine  2.5 mg Oral BID WC  . mirtazapine  7.5 mg Oral QHS  . morphine  15 mg Oral Q12H  . multivitamin with minerals  1 tablet Oral Daily  . OLANZapine  5 mg Oral QHS  . ondansetron  8 mg Oral Q6H  . polyethylene glycol  17 g Oral BID  . senna  2 tablet Oral BID   Continuous Infusions: . sodium chloride Stopped (03/22/20 1658)  . ondansetron (ZOFRAN) IV 8 mg (03/26/20 1502)     LOS: 10 days    Time spent: 37 minutes spent on chart review, discussion with nursing staff, consultants, updating family and interview/physical exam; more than 50% of that time was spent in counseling and/or coordination of care.    Isley Zinni J [place] DO [facility] Available via Epic secure chat 7am-7pm After these hours, please refer to coverage provider listed on [email or web address] 03/28/2020, 11:44 AM

## 2020-03-28 NOTE — Progress Notes (Addendum)
Advanced Heart Failure Rounding Note  PCP-Cardiologist: No primary care provider on file.     Patient Profile   74 y/o woman with recently diagnosed metastatic breast CA (undergoing XRT), CAD s/p CABG, DM2, systolic HF with EF 06-26% and PAF.   Admitted with sepsis and lactic acidosis due to severe LE cellulitis in setting of marked volume overload. Course complicated by AF with RVR.   Subjective:    Tolerating metoprolol ok so far. HRs much improved, in the 80s today. BP upper 94W systolic.   Overall feels better today. No nausea. Able to eat breakfast. No orthostatic symptoms. Remains fluid overloaded, but edema improving. I/Os not accurate due to incontinence. Wt up 3 lb.  Scr back up after IV Lasix + metolazone yesterday, 1.39>>1.62. K 4.3.   Objective:   Weight Range: 74.9 kg Body mass index is 28.34 kg/m.   Vital Signs:   Temp:  [97.5 F (36.4 C)-98.1 F (36.7 C)] 97.9 F (36.6 C) (08/25 0747) Pulse Rate:  [79-109] 87 (08/25 0747) Resp:  [14-21] 14 (08/25 0747) BP: (93-116)/(62-78) 104/78 (08/25 0747) SpO2:  [93 %-96 %] 95 % (08/25 0747) Weight:  [74.9 kg] 74.9 kg (08/25 0506) Last BM Date: 03/27/20  Weight change: Filed Weights   03/26/20 0423 03/27/20 0500 03/28/20 0506  Weight: 73.7 kg 73.9 kg 74.9 kg    Intake/Output:   Intake/Output Summary (Last 24 hours) at 03/28/2020 0955 Last data filed at 03/27/2020 2200 Gross per 24 hour  Intake 120 ml  Output --  Net 120 ml      Physical Exam   General: chronically ill appearing, No resp difficulty HEENT: normal Neck: supple. JVP to jaw . Carotids 2+ bilat; no bruits. No lymphadenopathy or thryomegaly appreciated. Cor: PMI nondisplaced. Irregularly irregular rhythm, regular rate. No rubs, gallops or murmurs. Lungs: clear Abdomen: soft, nontender, nondistended. No hepatosplenomegaly. No bruits or masses. Good bowel sounds. Extremities: no cyanosis, clubbing, rash, 1+ bilateral LE up to knees + unna  boots  Neuro: alert & orientedx3, cranial nerves grossly intact. moves all 4 extremities w/o difficulty. Affect pleasant   Telemetry   Afib, 80s--90s   EKG    No new EKG to review   Labs    CBC Recent Labs    03/27/20 0412 03/28/20 0310  WBC 7.9 8.9  NEUTROABS 6.2 6.8  HGB 9.6* 9.9*  HCT 31.5* 32.8*  MCV 89.2 88.9  PLT 276 546   Basic Metabolic Panel Recent Labs    03/27/20 0412 03/27/20 0815 03/28/20 0310  NA 135  --  132*  K 4.1  --  4.3  CL 98  --  97*  CO2 25  --  24  GLUCOSE 91  --  99  BUN 44*  --  42*  CREATININE 1.39*  --  1.62*  CALCIUM 6.4* 6.9* 6.8*  MG 2.1  --   --    Liver Function Tests Recent Labs    03/27/20 0412 03/28/20 0310  AST 21 23  ALT 19 21  ALKPHOS 88 94  BILITOT 1.1 1.1  PROT 5.5* 5.4*  ALBUMIN 2.3* 2.5*   No results for input(s): LIPASE, AMYLASE in the last 72 hours. Cardiac Enzymes No results for input(s): CKTOTAL, CKMB, CKMBINDEX, TROPONINI in the last 72 hours.  BNP: BNP (last 3 results) Recent Labs    12/04/19 1324 03/18/20 0229  BNP 581.1* 912.0*    ProBNP (last 3 results) No results for input(s): PROBNP in the last 8760 hours.  D-Dimer No results for input(s): DDIMER in the last 72 hours. Hemoglobin A1C No results for input(s): HGBA1C in the last 72 hours. Fasting Lipid Panel No results for input(s): CHOL, HDL, LDLCALC, TRIG, CHOLHDL, LDLDIRECT in the last 72 hours. Thyroid Function Tests No results for input(s): TSH, T4TOTAL, T3FREE, THYROIDAB in the last 72 hours.  Invalid input(s): FREET3  Other results:   Imaging    No results found.   Medications:     Scheduled Medications: . amiodarone  200 mg Oral Daily  . apixaban  5 mg Oral BID  . exemestane  25 mg Oral QPC breakfast  . furosemide  80 mg Intravenous Daily  . insulin aspart  0-15 Units Subcutaneous TID AC & HS  . lactose free nutrition  237 mL Oral BID BM  . metoprolol succinate  25 mg Oral QHS  . midodrine  2.5 mg Oral BID  WC  . mirtazapine  7.5 mg Oral QHS  . morphine  15 mg Oral Q12H  . multivitamin with minerals  1 tablet Oral Daily  . OLANZapine  5 mg Oral QHS  . ondansetron  8 mg Oral Q6H  . polyethylene glycol  17 g Oral BID  . senna  2 tablet Oral BID    Infusions: . sodium chloride Stopped (03/22/20 1658)  . ferumoxytol    . ondansetron [facility]) IV 8 mg (03/26/20 1502)    PRN Medications: sodium chloride, acetaminophen **OR** acetaminophen, HYDROmorphone (DILAUDID) injection, hydrOXYzine, LORazepam, morphine, sodium chloride   Assessment/Plan    1. Atrial Fibrillation with RVR - S/p MAZE and LAA clipping at time of CABG in 11/2016. S/p DCCV in 09/2015 and more recently 12/09/2019. Noted to be back in atrial fibrillation at last office visit on 01/30/2020 and started on low dose PO Amiodarone. - Presented in atrial fibrillation with RVR in the setting of sepsis secondary to cellulitis of bilateral lower extremities. Difficult situation complicated by patient's intolerance to multiple cardiac meds.  - Not been able to tolerate IV Amiodarone due to severe nausea. - Refused beta-blocker in the past due to hypotension with syncope. - Calcium channel blockers not a good option with reduced EF. - Digoxin not a good option with current AKI.  - continue to treat w/ low dose PO amio, 200 mg daily  - low dose Toprol XL added, 25 mg at bedtime. HR improved, 80s. BP ok. Continue current dose - Continue Eliquis 5mg  twice daily.    2. Acute on Chronic Combined CHF - History of ischemic cardiomyopathy. PYP in the past negative for amyloid. - Most recent Echo in 12/2019 showed LVEF of 35-40% with severe asymmetric LVH, mild MR, moderate TR, and severely elevated PASP. - Chest x-ray showed cardiomegaly with mildly enlarged central pulmonary vessel but no overt edema. BNP 912 - Home Lasix held on admit and patient actually given some IV fluids on admission due to sepsis/ AKI. Diuretics restarted 8/17.  - Volume  status slowly improving. Continue IV lasix 80 mg daily. Hold metolazone w/ SCr bump  - intolerant to Losartan due to hyperkalemia  - Continue unna boots   3. CAD  - S/p CABG x4 in 11/2016.  - High-sensitivity troponin minimally elevated and flat at 22 >> 24. Not consistent with ACS. Suspect demand ischemia in setting of sepsis and atrial fibrillation with RVR. - She reports constant left sided flank/breast pain that is worse with inspiration for the last several months. Sounds like this may be secondary to radiation. Nothing that sounds  like angina. - No Aspirin with need for Eliquis.  - Continue statin.  4. 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 Had VF arrest in 11/2016 with appropriate ICD shock.  - No recurrent VT/VF.  - Had not been able to tolerate Amiodarone or beta-blocker in the past.  5. Sepsis Secondary to Bilateral Lower Extremity Cellulitis - initially placed on broad spectrum abx w/ vanc + meropenum. AF. Lactic acid normalized.  Blood cultures NGTD  - has completed course of abx, AF.  - WBC down to  8.9   6. 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 - Diagnosed in 12/2019. Lynch syndrome diagnosed by genetic study.  - Follows with Dr. Marin Olp as outpatient. On immunotherapy   7. Hyperkalemia - required lokelma this admit  - K stable today at 4.3  8. Type 2 Diabetes Mellitus - Management per primary team.  9. Acute on Chronic Kidney Disease - baseline SCr 1.0-1.30 range  - Creatinine 2.20 on admission, 2/2 sepsis and deceased PO intake with constant nausea and frequent vomiting -Overall improved but trending back up w/ diuresis,  1.4>>1.6 today  -Follow BMET daily while diuresing   Length of Stay: [place], PA-C  03/28/2020, 9:55 AM   Advanced Heart Failure Team Pager [phone] (M-F; 7a - 4p)  Please contact [facility] for night-coverage after hours (4p -7a ) and weekends on [email or web address]  Patient seen and examined with  the above-signed Advanced Practice Provider and/or Housestaff. I personally reviewed laboratory data, imaging studies and relevant notes. I independently examined the patient and formulated the important aspects of the plan. I have edited the note to reflect any of my changes or salient points. I have personally discussed the plan with the patient and/or family.  Continues to complain of marked LE edema. Legs wrapped and remains on IV lasix but weight actually up. Creatinine rising as well.   HR improved with addition of low dose b-blocker. BP tolerating it with addition of midodrine. On Eliquis. No bleeding  General:  Sitting up in bed. No resp difficulty HEENT: normal Neck: supple. JVP 6-7 Carotids 2+ bilat; no bruits. No lymphadenopathy or thryomegaly appreciated. Cor: PMI nondisplaced. Irregular rate & rhythm. No rubs, gallops or murmurs. Lungs: clear Abdomen: soft, nontender, nondistended. No hepatosplenomegaly. No bruits or masses. Good bowel sounds. Extremities: no cyanosis, clubbing, rash, + unna boots mild edema Neuro: alert & orientedx3, cranial nerves grossly intact. moves all 4 extremities w/o difficulty. Affect pleasant  AF rate improved on current regimen. Will continue. Diuresis has slowed, Creatinine getting worse with further attempts at diuresis I think overall volume status doesn't look to bad but she insists it remains markedly elevated so much so that she can't walk. In reviewing her chart her weight is actually down 20 pounds from several months ago. Will continue IV lasix one more day. If no response, will likely need to consider RHC to more fully evaluate volume status. I think she will need SNF,   Glori Bickers, MD  6:26 PM

## 2020-03-28 NOTE — Progress Notes (Signed)
Physical Therapy Treatment Patient Details Name: [name] MRN: [id number] DOB: [birth year] Today's Date: 03/28/2020    History of Present Illness [name] is a 74 y.o. 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 fib, ventricular tachycardia status post ICD in situ, breast cancer with bone mets, Lynch syndrome, Patient presented to the ED on 04/01/2020 with complaint of shortness of breath, worsening redness on both upper legs with weeping lesions    PT Comments    Pt was seen for mobility on the bed, with dense heavy limbs from fluid collection on legs.  Her willingness to be up walking was limited due to her report she had been up a lot today.  Pt has fluctuating effort for ROM to legs, esp variable with SLR's and hip and knee flexion movements.  Follow acutely to encourage more gait and standing activitiy.   Follow Up Recommendations  Supervision/Assistance - 24 hour     Equipment Recommendations  3in1 (PT)    Recommendations for Other Services       Precautions / Restrictions Precautions Precautions: Fall Precaution Comments: unna boots Restrictions Weight Bearing Restrictions: No    Mobility  Bed Mobility Overal bed mobility: Modified Independent                Transfers                    Ambulation/Gait                 Stairs             Wheelchair Mobility    Modified Rankin (Stroke Patients Only)       Balance                                            Cognition Arousal/Alertness: Awake/alert Behavior During Therapy: WFL for tasks assessed/performed Overall Cognitive Status: Impaired/Different from baseline Area of Impairment: Awareness;Problem solving;Attention                   Current Attention Level: Selective     Safety/Judgement: Decreased awareness of safety;Decreased awareness of deficits Awareness:  Intellectual Problem Solving: Slow processing;Difficulty sequencing;Requires verbal cues General Comments: pt reported being too tired to walk but finally agreed to exercises      Exercises General Exercises - Lower Extremity Ankle Circles/Pumps: AAROM;Both;5 reps Quad Sets: AROM;10 reps Gluteal Sets: AROM;10 reps Heel Slides: AROM;15 reps Hip ABduction/ADduction: AROM;15 reps Straight Leg Raises: AAROM;15 reps Hip Flexion/Marching: AAROM;15 reps    General Comments General comments (skin integrity, edema, etc.): pt is on bed with wet linen under her legs.  Declined to get up and have bed changed and had to repeat instructions for pt to agree to put dry pads under legs.  Does not follow reason to manage the seeping      Pertinent Vitals/Pain Pain Assessment: No/denies pain    Home Living                      Prior Function            PT Goals (current goals can now be found in the care plan section) Acute Rehab PT Goals Patient Stated Goal: get home Progress towards PT goals: Progressing toward goals    Frequency  Min 3X/week      PT Plan Current plan remains appropriate    Co-evaluation              AM-PAC PT "6 Clicks" Mobility   Outcome Measure  Help needed turning from your back to your side while in a flat bed without using bedrails?: None Help needed moving from lying on your back to sitting on the side of a flat bed without using bedrails?: A Little Help needed moving to and from a bed to a chair (including a wheelchair)?: A Little Help needed standing up from a chair using your arms (e.g., wheelchair or bedside chair)?: A Little Help needed to walk in hospital room?: A Little Help needed climbing 3-5 steps with a railing? : A Lot 6 Click Score: 18    End of Session   Activity Tolerance: Patient tolerated treatment well Patient left: in bed;with call bell/phone within reach Nurse Communication: Mobility status PT Visit Diagnosis:  Unsteadiness on feet (R26.81);Difficulty in walking, not elsewhere classified (R26.2);Pain     Time: 0677-0340 PT Time Calculation (min) (ACUTE ONLY): 26 min  Charges:  $Therapeutic Exercise: 23-37 mins                    Ramond Dial 03/28/2020, 5:19 PM  Mee Hives, PT MS Acute Rehab Dept. Number: [place] [facility] [place]

## 2020-03-29 LAB — GLUCOSE, CAPILLARY
Glucose-Capillary: 58 mg/dL — ABNORMAL LOW (ref 70–99)
Glucose-Capillary: 61 mg/dL — ABNORMAL LOW (ref 70–99)
Glucose-Capillary: 146 mg/dL — ABNORMAL HIGH (ref 70–99)
Glucose-Capillary: 150 mg/dL — ABNORMAL HIGH (ref 70–99)
Glucose-Capillary: 65 mg/dL — ABNORMAL LOW (ref 70–99)
Glucose-Capillary: 114 mg/dL — ABNORMAL HIGH (ref 70–99)

## 2020-03-29 LAB — BASIC METABOLIC PANEL
Anion gap: 12 (ref 5–15)
BUN: 42 mg/dL — ABNORMAL HIGH (ref 8–23)
CO2: 25 mmol/L (ref 22–32)
Calcium: 6.8 mg/dL — ABNORMAL LOW (ref 8.9–10.3)
Chloride: 96 mmol/L — ABNORMAL LOW (ref 98–111)
Creatinine, Ser: 1.82 mg/dL — ABNORMAL HIGH (ref 0.44–1.00)
GFR calc Af Amer: 32 mL/min — ABNORMAL LOW (ref >60)
GFR calc non Af Amer: 27 mL/min — ABNORMAL LOW (ref >60)
Glucose, Bld: 68 mg/dL — ABNORMAL LOW (ref 70–99)
Potassium: 4.1 mmol/L (ref 3.5–5.1)
Sodium: 133 mmol/L — ABNORMAL LOW (ref 135–145)

## 2020-03-29 LAB — CBC
HCT: 33.3 % — ABNORMAL LOW (ref 36.0–46.0)
Hemoglobin: 9.9 g/dL — ABNORMAL LOW (ref 12.0–15.0)
MCH: 26.7 pg (ref 26.0–34.0)
MCHC: 29.7 g/dL — ABNORMAL LOW (ref 30.0–36.0)
MCV: 89.8 fL (ref 80.0–100.0)
Platelets: 296 10*3/uL (ref 150–400)
RBC: 3.71 MIL/uL — ABNORMAL LOW (ref 3.87–5.11)
RDW: 22.6 % — ABNORMAL HIGH (ref 11.5–15.5)
WBC: 8 10*3/uL (ref 4.0–10.5)
nRBC: 0 % (ref 0.0–0.2)

## 2020-03-29 LAB — MAGNESIUM: Magnesium: 2.1 mg/dL (ref 1.7–2.4)

## 2020-03-29 LAB — ERYTHROPOIETIN: Erythropoietin: 65.8 m[IU]/mL — ABNORMAL HIGH (ref 2.6–18.5)

## 2020-03-29 MED ORDER — SODIUM CHLORIDE 0.9 % IV SOLN: INTRAVENOUS | Status: DC

## 2020-03-29 MED ORDER — SODIUM CHLORIDE 0.9% FLUSH
3.0000 mL | Freq: Two times a day (BID) | INTRAVENOUS | Status: DC
  Administered 2020-03-29 – 2020-03-30 (×2): 3 mL via INTRAVENOUS

## 2020-03-29 MED ORDER — ASPIRIN 81 MG PO CHEW
81.0000 mg | CHEWABLE_TABLET | ORAL | Status: AC
  Administered 2020-03-30: 81 mg via ORAL
  Filled 2020-03-29: qty 1

## 2020-03-29 MED ORDER — APIXABAN 5 MG PO TABS
5.0000 mg | ORAL_TABLET | Freq: Two times a day (BID) | ORAL | Status: DC
  Administered 2020-03-30 – 2020-04-06 (×14): 5 mg via ORAL
  Filled 2020-03-29 (×14): qty 1

## 2020-03-29 MED ORDER — SODIUM CHLORIDE 0.9 % IV SOLN: 250.0000 mL | INTRAVENOUS | Status: DC | PRN

## 2020-03-29 MED ORDER — APIXABAN 5 MG PO TABS
5.0000 mg | ORAL_TABLET | Freq: Once | ORAL | Status: AC
  Administered 2020-03-29: 5 mg via ORAL
  Filled 2020-03-29: qty 1

## 2020-03-29 MED ORDER — SODIUM CHLORIDE 0.9% FLUSH
3.0000 mL | INTRAVENOUS | Status: DC | PRN
  Administered 2020-03-29: 3 mL via INTRAVENOUS

## 2020-03-29 MED ORDER — ONDANSETRON HCL 4 MG PO TABS
8.0000 mg | ORAL_TABLET | Freq: Four times a day (QID) | ORAL | Status: DC | PRN
  Administered 2020-04-04 – 2020-04-07 (×2): 8 mg via ORAL
  Filled 2020-03-29 (×2): qty 2

## 2020-03-29 MED ORDER — SODIUM CHLORIDE 0.9 % IV SOLN
8.0000 mg | Freq: Four times a day (QID) | INTRAVENOUS | Status: DC | PRN
  Filled 2020-03-29: qty 4

## 2020-03-29 NOTE — Progress Notes (Signed)
Pt found sitting on floor next to her bed by MD and NT.  Pt stated she sat herself on the floor while trying to reach an object that she dropped.  Vitals taken, pt assessed.  No harm came to pt.  MD and NT helped pt back into her bed and pt educated with regard to calling staff for help in the future.  Fall precautions implemented.  Will continue to monitor.

## 2020-03-29 NOTE — Progress Notes (Signed)
PROGRESS NOTE    [name]  YJE:[id number] DOB: 04/04/1946 DOA: 03/30/2020 PCP: Patient, No Pcp Per    Brief Narrative:  [name] is a 74 y.o. 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 fib status post ICD, breast cancer with bone mets/Lynch syndrome who presented on 8/14 with several days of shortness of breath, worsening redness of lower legs and weeping lesions for the past 2 weeks in setting of using Derm stone to scrape her dry skin and found to have A. fib with RVR to the 120s and sepsis physiology (white count of 24.5, lactic acid 2.2, Respiratory rate of 21) secondary to bilateral lower extremity cellulitis course complicated by A. 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   And to the ED with tachycardia, tachypnea, leukocytosis in the setting of extensive left thigh cellulitis with associated AKI.  Patient was initially started on empiric antibiotics with vancomycin and meropenem which was transitioned to ceftriaxone in which she completed a 7-day course ending on 03/25/2020.  A. fib with RVR Likely exacerbated in setting of sepsis due to  above, persists.  Heart rate remains in the 110s to 120s .  Patient's been unable to tolerate IV amiodarone due to nausea in the past, CCB is not an option with decreased EF, hypotension to prior beta-blockade (patient declines IV amiodarone or higher p.o. dosing) --Cardiology following, appreciate assistance --Continue low-dose amiodarone 200 mg p.o. daily --Metoprolol succinate 25 mg p.o. daily --Continue Eliquis 5 mg p.o. twice daily --Continue monitor on telemetry  Acute on chronic, systolic/diastolic CHF.   Still quite volume overloaded on exam, some slight improvement in bilateral lower extremity swelling with 2-3+ pitting edema bilateral lower extremities. BNP in the 900s on admission.  Intolerant to losartan in the past due to hyperkalemia. --Cardiology/heart failure service following, appreciate assistance --Cardiology holding metolazone and furosemide today uptrending creatinine --Continue Unna boots --Continue monitor strict I's and O's and daily weights --Dr. Haroldine Laws plans RHC tomorrow morning; NPO after MN  Hypotension BP 96/62 this morning --continue midodrine 2.5 mg BID per Cardiology recommendations --monitor closely while initiated on Toprol-XL  Slightly elevated high-sensitivity troponin CAD status post CABG (2018).  Suspect demand ischemia in setting of sepsis and A. fib with RVR Not having any anginal equivalents, does have chronic constant left-sided flank pain for several months related to radiation -continue home Eliquis and statin  Left-sided rib cage pain, found to have worsening pre-existing pathologic fractures in thoracic spine on CT chest (T5 and T8) and development of paraspinal soft tissue soft tissue lumbar spine concerning for worsening malignancy.   --Pain control improved after IV Toradol --Continue pain control with Home MS Contin, as needed  IV Morphine --optimize bowel regimen  History of VT/V. fib, status post ICD (2016), history of V. fib  arrest in 11/2016 -Has not been able to tolerate amiodarone or beta blockade in the past -Currently on amiodarone 200 mg daily --Continue to monitor on telemetry  Metastatic breast cancer with bone mets.  Diagnosed 12/2019. --Continue outpatient follow-up with Dr. Marin Olp, on immunotherapy. --Appreciate Dr. Antonieta Pert assistance, he will discuss with radiology   Type 2 diabetes Hemoglobin A1c A1c 7.2.  -Holding home glipizide -Monitor CBG, sliding scale as needed --Nutrition consulted to assist with diet supplementation  Acute on CKD with hyperkalemia    Peak creatinine of 2.2, baseline of ~1.6. Required lokelma on 8/20 for recurrent hyperkalemia, now improved at 4.3. --Cr 2.2>1.9>1.69>1.8>1.54>1.44>1.39>1.62>1.82 --Avoid nephrotoxins (had toradol x3 for back pain on 8/19) --Monitor BMP daily --Continue to monitor urinary output closely  Hypocalcemia.  Corrected for hypoalbuminemia Ca is 7.2. Phos and Mg wnl. Suspect secondary to Vit D deficiency --check PTH--had to be resent --IV calcium gluconate per pharmacy protocol --monitor Calcium   DVT prophylaxis: Eliquis Code Status: DNR Family Communication: None present at bedside  Disposition Plan:  Status is: Inpatient  Remains inpatient appropriate because:Ongoing diagnostic testing needed not appropriate for outpatient work up, Unsafe d/c plan, IV treatments appropriate due to intensity of illness or inability to take PO and Inpatient level of care appropriate due to severity of illness   Dispo: The patient is from: Home              Anticipated d/c is to: Home              Anticipated d/c date is: 2 days              Patient currently is not medically stable to d/c.   Consultants:   Cardiology, heart failure service  Procedures:   Venous duplex ultrasound lower extremities 03/21/2020  Antimicrobials:   Vancomycin 8/15 - 8/17  Meropenem 8/15 - 8/17  Ceftriaxone 8/17 - 8/22   Subjective: Patient seen and  examined bedside.  Found to be on the floor.  Patient states that she was sitting there for roughly 5 minutes as she was trying to get something that fell on the floor.  Assisted patient back to bed with nursing technician.  Discussed with patient needs to call for assistance if needs to mobilize given her severe debility.  Patient states continues with significant lower extremity edema, discussed with her that anytime she is seated or lying needs to elevate her lower extremities (although every time I see her she is dangling her legs).  Cardiology plans for right heart catheterization tomorrow.  Holding metolazone/Lasix for continued rise of creatinine.  No other questions or concerns at this time.,  No fever/chills/night sweats, nausea cefonicid/diarrhea, no chest pain, no palpitation, no shortness of breath, no abdominal pain.  No acute events overnight per nursing staff.  Objective: Vitals:   03/29/20 0004 03/29/20 0629 03/29/20 0752 03/29/20 1019  BP: (!) 94/58 106/64 110/80 117/69  Pulse: 67 85 72   Resp: 18 19 19    Temp: 97.6 F (36.4 C) 97.6 F (36.4 C)    TempSrc: Oral Oral Oral   SpO2: 91% 96% 100%   Weight:  75.3 kg    Height:        Intake/Output Summary (Last 24 hours) at 03/29/2020 1200 Last data filed at 03/29/2020 0300 Gross per 24 hour  Intake 100 ml  Output 400 ml  Net -300 ml  Filed Weights   03/27/20 0500 03/28/20 0506 03/29/20 0629  Weight: 73.9 kg 74.9 kg 75.3 kg    Examination:  General exam: Appears calm and comfortable  Respiratory system: Clear to auscultation. Respiratory effort normal. Cardiovascular system: S1 & S2 heard, RRR. No JVD, murmurs, rubs, gallops or clicks.  2+ pitting edema up to knees with Unna boots in place Gastrointestinal system: Abdomen is nondistended, soft and nontender. No organomegaly or masses felt. Normal bowel sounds heard. Central nervous system: Alert and oriented. No focal neurological deficits. Extremities: Symmetric 5 x 5  power. Skin: No rashes, lesions or ulcers Psychiatry: Judgement and insight appear normal. Mood & affect appropriate.     Data Reviewed: I have personally reviewed following labs and imaging studies  CBC: Recent Labs  Lab 03/25/20 1110 03/26/20 0225 03/27/20 0412 03/28/20 0310 03/29/20 0158  WBC 11.4* 9.4 7.9 8.9 8.0  NEUTROABS  --  7.9* 6.2 6.8  --   HGB 11.5* 10.9* 9.6* 9.9* 9.9*  HCT 39.3 36.5 31.5* 32.8* 33.3*  MCV 92.7 89.5 89.2 88.9 89.8  PLT 252 243 276 326 277   Basic Metabolic Panel: Recent Labs  Lab 03/25/20 0312 03/25/20 0724 03/25/20 0816 03/25/20 1110 03/26/20 0225 03/26/20 0225 03/26/20 0730 03/26/20 1220 03/27/20 0412 03/27/20 0815 03/28/20 0310 03/29/20 0158  NA 132*  --   --   --  136  --   --   --  135  --  132* 133*  K 4.7   < >  --    < > 5.1   < > 4.6 4.5 4.1  --  4.3 4.1  CL 99  --   --   --  102  --   --   --  98  --  97* 96*  CO2 21*  --   --   --  22  --   --   --  25  --  24 25  GLUCOSE 79  --   --   --  79  --   --   --  91  --  99 68*  BUN 53*  --   --   --  48*  --   --   --  44*  --  42* 42*  CREATININE 1.54*  --   --   --  1.44*  --   --   --  1.39*  --  1.62* 1.82*  CALCIUM 6.2*  --  6.6*  --  6.5*  --   --   --  6.4* 6.9* 6.8* 6.8*  MG  --   --  2.2  --   --   --   --   --  2.1  --   --  2.1  PHOS  --   --  3.3  --   --   --   --   --   --   --   --   --    < > = values in this interval not displayed.   GFR: Estimated Creatinine Clearance: 27.7 mL/min (A) (by C-G formula based on SCr of 1.82 mg/dL (H)). Liver Function Tests: Recent Labs  Lab 03/23/20 0245 03/25/20 0816 03/26/20 0225 03/27/20 0412 03/28/20 0310  AST 36 28 27 21 23   ALT 23 24 23 19 21   ALKPHOS 126 105 98 88 94  BILITOT 2.1* 1.0 1.0 1.1 1.1  PROT 6.6 6.4* 6.1* 5.5* 5.4*  ALBUMIN 2.8* 2.6* 2.6* 2.3* 2.5*  No results for input(s): LIPASE, AMYLASE in the last 168 hours. No results for input(s): AMMONIA in the last 168 hours. Coagulation Profile: No  results for input(s): INR, PROTIME in the last 168 hours. Cardiac Enzymes: No results for input(s): CKTOTAL, CKMB, CKMBINDEX, TROPONINI in the last 168 hours. BNP (last 3 results) No results for input(s): PROBNP in the last 8760 hours. HbA1C: No results for input(s): HGBA1C in the last 72 hours. CBG: Recent Labs  Lab 03/28/20 1647 03/28/20 2123 03/29/20 0631 03/29/20 0749 03/29/20 1158  GLUCAP 166* 97 58* 61* 146*   Lipid Profile: No results for input(s): CHOL, HDL, LDLCALC, TRIG, CHOLHDL, LDLDIRECT in the last 72 hours. Thyroid Function Tests: No results for input(s): TSH, T4TOTAL, FREET4, T3FREE, THYROIDAB in the last 72 hours. Anemia Panel: Recent Labs    03/27/20 0815  FERRITIN 123  TIBC 300  IRON 38   Sepsis Labs: No results for input(s): PROCALCITON, LATICACIDVEN in the last 168 hours.  No results found for this or any previous visit (from the past 240 hour(s)).       Radiology Studies: No results found.   Scheduled Meds: . amiodarone  200 mg Oral Daily  . [START ON 03/23/2020] apixaban  5 mg Oral BID  . [START ON 03/31/2020] aspirin  81 mg Oral Pre-Cath  . exemestane  25 mg Oral QPC breakfast  . insulin aspart  0-15 Units Subcutaneous TID AC & HS  . lactose free nutrition  237 mL Oral BID BM  . metoprolol succinate  25 mg Oral QHS  . midodrine  2.5 mg Oral BID WC  . mirtazapine  7.5 mg Oral QHS  . morphine  15 mg Oral Q12H  . multivitamin with minerals  1 tablet Oral Daily  . OLANZapine  5 mg Oral QHS  . ondansetron  8 mg Oral Q6H  . polyethylene glycol  17 g Oral BID  . senna  2 tablet Oral BID  . sodium chloride flush  3 mL Intravenous Q12H   Continuous Infusions: . sodium chloride Stopped (03/22/20 1658)  . sodium chloride    . [START ON 03/22/2020] sodium chloride    . ondansetron (ZOFRAN) IV 8 mg (03/26/20 1502)     LOS: 11 days    Time spent: 37 minutes spent on chart review, discussion with nursing staff, consultants, updating family  and interview/physical exam; more than 50% of that time was spent in counseling and/or coordination of care.    Tylik Treese J [place] DO [facility] Available via Epic secure chat 7am-7pm After these hours, please refer to coverage provider listed on [email or web address] 03/29/2020, 12:00 PM

## 2020-03-29 NOTE — Plan of Care (Signed)
Poc progressing.  

## 2020-03-29 NOTE — Progress Notes (Signed)
Orthopedic Tech Progress Note Patient Details:  [name] 1945-08-08 [id number]  Ortho Devices Type of Ortho Device: Haematologist Ortho Device/Splint Location: Bilateral Ortho Device/Splint Interventions: Ordered, Application   Post Interventions Patient Tolerated: Well Instructions Provided: Care of device   Kritika Stukes A Roben Schliep 03/29/2020, 8:10 PM

## 2020-03-29 NOTE — Progress Notes (Signed)
Inpatient Diabetes Program Recommendations  AACE/ADA: New Consensus Statement on Inpatient Glycemic Control (2015)  Target Ranges:  Prepandial:   less than 140 mg/dL      Peak postprandial:   less than 180 mg/dL (1-2 hours)      Critically ill patients:  140 - 180 mg/dL   Lab Results  Component Value Date   GLUCAP 146 (H) 03/29/2020   HGBA1C 7.2 (H) 03/18/2020    Review of Glycemic Control Results for [name], [name] (MRN [id number]) as of 03/29/2020 12:21  Ref. Range 03/28/2020 16:47 03/28/2020 21:23 03/29/2020 06:31 03/29/2020 07:49 03/29/2020 11:58  Glucose-Capillary Latest Ref Range: 70 - 99 mg/dL 166 (H) 97 58 (L) 61 (L) 146 (H)   Diabetes history:  DM2  Outpatient Diabetes medications:  Glipizide 5 daily  Current orders for Inpatient glycemic control:  Novolog 0-15 tid & hs  Inpatient Diabetes Program Recommendations:    Decrease Novolog 0-9 units tid  Will continue to follow while inpatient.  Thank you, Reche Dixon, RN, BSN Diabetes Coordinator Inpatient Diabetes Program [phone] (team pager from 8a-5p)

## 2020-03-29 NOTE — Progress Notes (Signed)
Advanced Heart Failure Rounding Note  PCP-Cardiologist: No primary care provider on file.     Patient Profile   74 y/o woman with recently diagnosed metastatic breast CA (undergoing XRT), CAD s/p CABG, DM2, systolic HF with EF 59-56% and PAF.   Admitted with sepsis and lactic acidosis due to severe LE cellulitis in setting of marked volume overload. Course complicated by AF with RVR.   Subjective:    Says she is feeling better but still feels swollen. Getting daily IV lasix. Creatinine climbing. Weight up a pound.  No orthopnea or PND.   HR much better controlled. BP stable.   Objective:   Weight Range: 75.3 kg Body mass index is 28.49 kg/m.   Vital Signs:   Temp:  [97.6 F (36.4 C)-97.9 F (36.6 C)] 97.6 F (36.4 C) (08/26 0629) Pulse Rate:  [65-87] 85 (08/26 0629) Resp:  [14-20] 19 (08/26 0629) BP: (94-106)/(56-79) 106/64 (08/26 0629) SpO2:  [91 %-96 %] 96 % (08/26 0629) Weight:  [75.3 kg] 75.3 kg (08/26 0629) Last BM Date: 03/27/20  Weight change: Filed Weights   03/27/20 0500 03/28/20 0506 03/29/20 0629  Weight: 73.9 kg 74.9 kg 75.3 kg    Intake/Output:   Intake/Output Summary (Last 24 hours) at 03/29/2020 0710 Last data filed at 03/29/2020 0300 Gross per 24 hour  Intake 100 ml  Output 400 ml  Net -300 ml      Physical Exam   General:  Sitting up in chair No resp difficulty HEENT: normal Neck: supple. JVP 6. Carotids 2+ bilat; no bruits. No lymphadenopathy or thryomegaly appreciated. Cor: PMI nondisplaced. Irregular rate & rhythm. No rubs, gallops or murmurs. Lungs: clear Abdomen: soft, nontender, nondistended. No hepatosplenomegaly. No bruits or masses. Good bowel sounds. Extremities: no cyanosis, clubbing, rash, 1+ edema  + unna boots Neuro: alert & orientedx3, cranial nerves grossly intact. moves all 4 extremities w/o difficulty. Affect pleasant   Telemetry   AF 80s Personally reviewed   EKG    No new EKG to review   Labs      CBC Recent Labs    03/27/20 0412 03/27/20 0412 03/28/20 0310 03/29/20 0158  WBC 7.9   < > 8.9 8.0  NEUTROABS 6.2  --  6.8  --   HGB 9.6*   < > 9.9* 9.9*  HCT 31.5*   < > 32.8* 33.3*  MCV 89.2   < > 88.9 89.8  PLT 276   < > 326 296   < > = values in this interval not displayed.   Basic Metabolic Panel Recent Labs    03/27/20 0412 03/27/20 0815 03/28/20 0310 03/29/20 0158  NA 135  --  132* 133*  K 4.1  --  4.3 4.1  CL 98  --  97* 96*  CO2 25  --  24 25  GLUCOSE 91  --  99 68*  BUN 44*  --  42* 42*  CREATININE 1.39*  --  1.62* 1.82*  CALCIUM 6.4*   < > 6.8* 6.8*  MG 2.1  --   --  2.1   < > = values in this interval not displayed.   Liver Function Tests Recent Labs    03/27/20 0412 03/28/20 0310  AST 21 23  ALT 19 21  ALKPHOS 88 94  BILITOT 1.1 1.1  PROT 5.5* 5.4*  ALBUMIN 2.3* 2.5*   No results for input(s): LIPASE, AMYLASE in the last 72 hours. Cardiac Enzymes No results for input(s): CKTOTAL, CKMB, CKMBINDEX,  TROPONINI in the last 72 hours.  BNP: BNP (last 3 results) Recent Labs    12/04/19 1324 03/18/20 0229  BNP 581.1* 912.0*    ProBNP (last 3 results) No results for input(s): PROBNP in the last 8760 hours.   D-Dimer No results for input(s): DDIMER in the last 72 hours. Hemoglobin A1C No results for input(s): HGBA1C in the last 72 hours. Fasting Lipid Panel No results for input(s): CHOL, HDL, LDLCALC, TRIG, CHOLHDL, LDLDIRECT in the last 72 hours. Thyroid Function Tests No results for input(s): TSH, T4TOTAL, T3FREE, THYROIDAB in the last 72 hours.  Invalid input(s): FREET3  Other results:   Imaging    No results found.   Medications:     Scheduled Medications:  amiodarone  200 mg Oral Daily   apixaban  5 mg Oral BID   exemestane  25 mg Oral QPC breakfast   furosemide  80 mg Intravenous Daily   insulin aspart  0-15 Units Subcutaneous TID AC & HS   lactose free nutrition  237 mL Oral BID BM   metoprolol succinate  25  mg Oral QHS   midodrine  2.5 mg Oral BID WC   mirtazapine  7.5 mg Oral QHS   morphine  15 mg Oral Q12H   multivitamin with minerals  1 tablet Oral Daily   OLANZapine  5 mg Oral QHS   ondansetron  8 mg Oral Q6H   polyethylene glycol  17 g Oral BID   senna  2 tablet Oral BID    Infusions:  sodium chloride Stopped (03/22/20 1658)   ondansetron (ZOFRAN) IV 8 mg (03/26/20 1502)    PRN Medications: sodium chloride, acetaminophen **OR** acetaminophen, HYDROmorphone (DILAUDID) injection, hydrOXYzine, LORazepam, morphine, sodium chloride   Assessment/Plan    1. Atrial Fibrillation with RVR - S/p MAZE and LAA clipping at time of CABG in 11/2016. S/p DCCV in 09/2015 and more recently 12/09/2019. Noted to be back in atrial fibrillation at last office visit on 01/30/2020 and started on low dose PO Amiodarone. - Presented in atrial fibrillation with RVR in the setting of sepsis secondary to cellulitis of bilateral lower extremities. Difficult situation complicated by patient's intolerance to multiple cardiac meds.  - Not been able to tolerate IV Amiodarone due to severe nausea. - Calcium channel blockers not a good option with reduced EF. - Digoxin not a good option with current AKI.  - continue to treat w/ low dose PO amio, 200 mg daily  (refuses higher dose) - low dose Toprol XL added, 25 mg at bedtime. HR improved, 80s. BP stable with midodrine support - Continue Eliquis 5mg  twice daily.  No bleeding   2. Acute on Chronic Combined CHF - History of ischemic cardiomyopathy. PYP in the past negative for amyloid. - Most recent Echo in 12/2019 showed LVEF of 35-40% with severe asymmetric LVH, mild MR, moderate TR, and severely elevated PASP. - Chest x-ray showed cardiomegaly with mildly enlarged central pulmonary vessel but no overt edema. BNP 912 - Home Lasix held on admit and patient actually given some IV fluids on admission due to sepsis/ AKI. Diuretics restarted 8/17.  - Legs look  better but weight and creatinine trending up. I think overall volume status doesn't look to bad but she insists it remainselevated so much so that she can't walk. In reviewing her chart her weight is actually down 20 pounds from several months ago. Will hold lasix and plan RHC in am tomorrow  - intolerant to Losartan due to hyperkalemia  -  Continue unna boots   3. CAD  - S/p CABG x4 in 11/2016.  - High-sensitivity troponin minimally elevated and flat at 22 >> 24. Not consistent with ACS. Suspect demand ischemia in setting of sepsis and atrial fibrillation with RVR. - No s/s angina  - No Aspirin with need for Eliquis.  - Continue statin.  4. 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 Had VF arrest in 11/2016 with appropriate ICD shock.  - No recurrent VT/VF - Had not been able to tolerate Amiodarone or beta-blocker in the past.  5. Sepsis Secondary to Bilateral Lower Extremity Cellulitis - initially placed on broad spectrum abx w/ vanc + meropenum. AF. Lactic acid normalized.  Blood cultures NGTD  - has completed course of abx, AF.  - Resolved  6. 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 - Diagnosed in 12/2019. Lynch syndrome diagnosed by genetic study.  - Follows with Dr. Marin Olp as outpatient. On immunotherapy   7. Hyperkalemia - required lokelma this admit  - K stable today at 4.1  8. Type 2 Diabetes Mellitus - Management per primary team.  9. Acute on Chronic Kidney Disease - baseline SCr 1.0-1.30 range  - Creatinine 2.20 on admission, 2/2 sepsis and deceased PO intake with constant nausea and frequent vomiting -Overall improved but trending back up w/ diuresis,  1.4>>1.6 >> 1.8 today  - Hold lasix. RHC in am   Length of Stay: Clarissa, MD  03/29/2020, 7:10 AM   Advanced Heart Failure Team Pager [phone] (M-F; [place])  Please contact [facility] for night-coverage after hours (4p -7a ) and weekends on [email or web address]

## 2020-03-30 ENCOUNTER — Encounter (HOSPITAL_COMMUNITY): Admission: EM | Disposition: E | Payer: Self-pay | Source: Home / Self Care | Attending: Internal Medicine

## 2020-03-30 HISTORY — PX: RIGHT HEART CATH: CATH118263

## 2020-03-30 LAB — POCT I-STAT EG7
Acid-Base Excess: 0 mmol/L (ref 0.0–2.0)
Acid-Base Excess: 1 mmol/L (ref 0.0–2.0)
Bicarbonate: 25.9 mmol/L (ref 20.0–28.0)
Bicarbonate: 27.1 mmol/L (ref 20.0–28.0)
Calcium, Ion: 0.74 mmol/L — CL (ref 1.15–1.40)
Calcium, Ion: 0.87 mmol/L — CL (ref 1.15–1.40)
HCT: 33 % — ABNORMAL LOW (ref 36.0–46.0)
HCT: 35 % — ABNORMAL LOW (ref 36.0–46.0)
Hemoglobin: 11.2 g/dL — ABNORMAL LOW (ref 12.0–15.0)
Hemoglobin: 11.9 g/dL — ABNORMAL LOW (ref 12.0–15.0)
O2 Saturation: 51 %
O2 Saturation: 54 %
Potassium: 3.7 mmol/L (ref 3.5–5.1)
Potassium: 4.2 mmol/L (ref 3.5–5.1)
Sodium: 135 mmol/L (ref 135–145)
Sodium: 139 mmol/L (ref 135–145)
TCO2: 27 mmol/L (ref 22–32)
TCO2: 29 mmol/L (ref 22–32)
pCO2, Ven: 46.1 mmHg (ref 44.0–60.0)
pCO2, Ven: 47.9 mmHg (ref 44.0–60.0)
pH, Ven: 7.359 (ref 7.250–7.430)
pH, Ven: 7.361 (ref 7.250–7.430)
pO2, Ven: 29 mmHg — CL (ref 32.0–45.0)
pO2, Ven: 30 mmHg — CL (ref 32.0–45.0)

## 2020-03-30 LAB — GLUCOSE, CAPILLARY
Glucose-Capillary: 60 mg/dL — ABNORMAL LOW (ref 70–99)
Glucose-Capillary: 117 mg/dL — ABNORMAL HIGH (ref 70–99)
Glucose-Capillary: 66 mg/dL — ABNORMAL LOW (ref 70–99)
Glucose-Capillary: 125 mg/dL — ABNORMAL HIGH (ref 70–99)
Glucose-Capillary: 70 mg/dL (ref 70–99)
Glucose-Capillary: 142 mg/dL — ABNORMAL HIGH (ref 70–99)

## 2020-03-30 LAB — BASIC METABOLIC PANEL
Anion gap: 12 (ref 5–15)
BUN: 45 mg/dL — ABNORMAL HIGH (ref 8–23)
CO2: 24 mmol/L (ref 22–32)
Calcium: 7 mg/dL — ABNORMAL LOW (ref 8.9–10.3)
Chloride: 95 mmol/L — ABNORMAL LOW (ref 98–111)
Creatinine, Ser: 1.94 mg/dL — ABNORMAL HIGH (ref 0.44–1.00)
GFR calc Af Amer: 29 mL/min — ABNORMAL LOW (ref >60)
GFR calc non Af Amer: 25 mL/min — ABNORMAL LOW (ref >60)
Glucose, Bld: 95 mg/dL (ref 70–99)
Potassium: 4.3 mmol/L (ref 3.5–5.1)
Sodium: 131 mmol/L — ABNORMAL LOW (ref 135–145)

## 2020-03-30 SURGERY — RIGHT HEART CATH
Anesthesia: LOCAL

## 2020-03-30 MED ORDER — DEXTROSE 50 % IV SOLN
INTRAVENOUS | Status: AC
  Administered 2020-03-30: 25 mL
  Filled 2020-03-30: qty 50

## 2020-03-30 MED ORDER — DEXTROSE 50 % IV SOLN
25.0000 mL | Freq: Once | INTRAVENOUS | Status: AC
  Administered 2020-03-30: 25 mL via INTRAVENOUS

## 2020-03-30 MED ORDER — FUROSEMIDE 10 MG/ML IJ SOLN
80.0000 mg | Freq: Two times a day (BID) | INTRAMUSCULAR | Status: DC
  Administered 2020-03-30 – 2020-04-03 (×9): 80 mg via INTRAVENOUS
  Filled 2020-03-30 (×9): qty 8

## 2020-03-30 MED ORDER — FENTANYL CITRATE (PF) 100 MCG/2ML IJ SOLN
INTRAMUSCULAR | Status: DC | PRN
Start: 2020-03-30 — End: 2020-03-30
  Administered 2020-03-30: 25 ug via INTRAVENOUS
  Administered 2020-03-30: 50 ug via INTRAVENOUS

## 2020-03-30 MED ORDER — LIDOCAINE HCL (CARDIAC) PF 100 MG/5ML IV SOSY
PREFILLED_SYRINGE | INTRAVENOUS | Status: AC
  Filled 2020-03-30: qty 5

## 2020-03-30 MED ORDER — MILRINONE LACTATE IN DEXTROSE 20-5 MG/100ML-% IV SOLN
0.2500 ug/kg/min | INTRAVENOUS | Status: DC
  Administered 2020-03-30 – 2020-04-04 (×5): 0.125 ug/kg/min via INTRAVENOUS
  Administered 2020-04-05: 0.25 ug/kg/min via INTRAVENOUS
  Filled 2020-03-30 (×7): qty 100

## 2020-03-30 MED ORDER — LIDOCAINE HCL (PF) 1 % IJ SOLN
INTRAMUSCULAR | Status: DC | PRN
  Administered 2020-03-30: 2 mL
  Administered 2020-03-30: 10 mL

## 2020-03-30 MED ORDER — HYDROMORPHONE HCL 1 MG/ML IJ SOLN
1.0000 mg | INTRAMUSCULAR | Status: DC | PRN
  Administered 2020-03-31 – 2020-04-04 (×8): 1 mg via INTRAVENOUS
  Filled 2020-03-30 (×8): qty 1

## 2020-03-30 MED ORDER — HEPARIN (PORCINE) IN NACL 1000-0.9 UT/500ML-% IV SOLN
INTRAVENOUS | Status: DC | PRN
  Administered 2020-03-30: 500 mL

## 2020-03-30 MED ORDER — FENTANYL CITRATE (PF) 100 MCG/2ML IJ SOLN
INTRAMUSCULAR | Status: AC
  Filled 2020-03-30: qty 2

## 2020-03-30 MED ORDER — HEPARIN (PORCINE) IN NACL 1000-0.9 UT/500ML-% IV SOLN
INTRAVENOUS | Status: AC
  Filled 2020-03-30: qty 500

## 2020-03-30 SURGICAL SUPPLY — 10 items
CATH BALLN WEDGE 5F 110CM (CATHETERS) ×1 IMPLANT
CATH SWAN GANZ 7F STRAIGHT (CATHETERS) ×1 IMPLANT
CLOSURE MYNX CONTROL 6F/7F (Vascular Products) ×1 IMPLANT
GUIDEWIRE .025 260CM (WIRE) ×1 IMPLANT
PACK CARDIAC CATHETERIZATION (CUSTOM PROCEDURE TRAY) ×2 IMPLANT
PROTECTION STATION PRESSURIZED (MISCELLANEOUS) ×2
SHEATH GLIDE SLENDER 4/5FR (SHEATH) ×1 IMPLANT
SHEATH PINNACLE 7F 10CM (SHEATH) ×1 IMPLANT
STATION PROTECTION PRESSURIZED (MISCELLANEOUS) IMPLANT
TRANSDUCER W/STOPCOCK (MISCELLANEOUS) ×2 IMPLANT

## 2020-03-30 NOTE — Progress Notes (Signed)
PROGRESS NOTE    [name]  NKN:[id number] DOB: 01/18/1946 DOA: 03/21/2020 PCP: Patient, No Pcp Per    Brief Narrative:  [name] is a 74 y.o. 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 fib status post ICD, breast cancer with bone mets/Lynch syndrome who presented on 8/14 with several days of shortness of breath, worsening redness of lower legs and weeping lesions for the past 2 weeks in setting of using Derm stone to scrape her dry skin and found to have A. fib with RVR to the 120s and sepsis physiology (white count of 24.5, lactic acid 2.2, Respiratory rate of 21) secondary to bilateral lower extremity cellulitis. course complicated by A. fib with RVR, AKI on CKD with hyperkalemia.   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   -Presented to the ER with tachycardia, tachypnea, leukocytosis in the setting of extensive left thigh cellulitis with associated AKI.  Patient was initially started on empiric antibiotics with vancomycin and meropenem which was transitioned to ceftriaxone in which she completed a 7-day course ending on 03/25/2020.  A. fib with RVR. Likely exacerbated in setting of sepsis  due to above, persists.  Heart rate acceptable now on amiodarone and metoprolol. On Eliquis   Acute on chronic, systolic/diastolic CHF.   Still quite volume overloaded. slight improvement in bilateral lower extremity swelling with 2-3+ pitting edema bilateral lower extremities. BNP in the 900s on admission.  Intolerant to losartan in the past due to hyperkalemia. --Cardiology/heart failure service following, appreciate assistance --Right heart cath today shows severe heart failure right more than left. --Continue Unna boots --Continue monitor strict I's and O's and daily weights --Started on IV Lasix and milrinone infusion today.  Hypotension Blood pressure remains low. --continue midodrine 2.5 mg BID per Cardiology recommendations --monitor closely while initiated on Toprol-XL  Breast cancer with bone metastasis: --Followed by Dr. Marin Olp.  Patient is on pain medications with MS Contin, short-acting morphine and as needed Dilaudid. --Patient is currently on immunotherapy. --Prognosis remains poor given metastatic breast cancer along with severe cardiovascular morbidities. --Appreciate palliative care involvement. --Home with home palliative care.  History of VT/V. fib, status post ICD (2016), history of V. fib arrest in 11/2016 -Has not been able to tolerate amiodarone or beta blockade in the past -Currently on amiodarone 200 mg daily --Continue to monitor on telemetry  Type 2 diabetes Hemoglobin A1c A1c 7.2.  -Holding home glipizide -Monitor CBG, sliding scale as needed --Nutrition consulted to assist with diet supplementation  Acute on CKD with hyperkalemia    Peak creatinine of 2.2, baseline of ~1.6. Required lokelma on 8/20 for recurrent hyperkalemia, now improved at 4.3. --Cr 2.2>1.9>1.69>1.8>1.54>1.44>1.39>1.62>1.82-1.9. --Avoid NSAIDs and Toradol. --Monitor BMP daily --Continue to monitor urinary output closely  DVT prophylaxis: Eliquis Code Status:  DNR Family Communication: None present at bedside  Disposition Plan:  Status is: Inpatient  Remains inpatient appropriate because:Ongoing diagnostic testing needed not appropriate for outpatient work up, Unsafe d/c plan, IV treatments appropriate due to intensity of illness or inability to take PO and Inpatient level of care appropriate due to severity of illness   Dispo: The patient is from: Home              Anticipated d/c is to: Home              Anticipated d/c date is: 2 days              Patient currently is not medically stable to d/c.   Consultants:   Cardiology, heart failure service  Procedures:   Venous duplex ultrasound lower extremities 03/21/2020  Antimicrobials:   Vancomycin 8/15 - 8/17  Meropenem 8/15 - 8/17  Ceftriaxone 8/17 - 8/22   Subjective: Patient seen and examined in the morning rounds. She was very tearful and wanting to have some strong IV pain medicine.  Patient thinks her breathing was better.  Denied any chest pain.  She thinks her legs are better now than before and they are not weeping as they were. Patient is states "I know I am suffering so much and being burden to the family" Patient was aware that she is going for cardiac cath, she was not sure she will be happy to go for it.  Objective: Vitals:   03/08/2020 1515 03/20/2020 1530 03/26/2020 1543 03/29/2020 1600  BP: 103/64 (!) 96/59 (!) 111/56 99/67  Pulse: 90 89 85 86  Resp:   19 20  Temp:   97.6 F (36.4 C)   TempSrc:   Oral   SpO2: 99%  95%   Weight:      Height:        Intake/Output Summary (Last 24 hours) at 03/20/2020 1652 Last data filed at 04/01/2020 1046 Gross per 24 hour  Intake 360 ml  Output 700 ml  Net -340 ml   Filed Weights   03/28/20 0506 03/29/20 0629 04/01/2020 0451  Weight: 74.9 kg 75.3 kg 74.9 kg    Examination:  General exam: Appears calm and comfortable  Respiratory system: Clear to auscultation. Respiratory effort normal. Cardiovascular system: S1 & S2  heard, RRR. No JVD, murmurs, rubs, gallops or clicks.  2+ pitting edema up to knees with Unna boots in place Gastrointestinal system: Abdomen is nondistended, soft and nontender. No organomegaly or masses felt. Normal bowel sounds heard. Central nervous system: Alert and oriented. No focal neurological deficits. Extremities: Symmetric 5 x 5 power. Skin: No rashes, lesions or ulcers Psychiatry: Judgement and insight appear normal. Mood & affect anxious and tearful.    Data Reviewed: I have personally reviewed following labs and imaging studies  CBC: Recent Labs  Lab 03/25/20 1110 03/25/20 1110 03/26/20 0225 03/27/20 0412 03/28/20 0310 03/29/20 0158 03/29/2020 1427  WBC 11.4*  --  9.4 7.9 8.9 8.0  --   NEUTROABS  --   --  7.9* 6.2 6.8  --   --   HGB 11.5*   < > 10.9* 9.6* 9.9* 9.9* 11.9*  11.2*  HCT 39.3   < > 36.5 31.5* 32.8* 33.3* 35.0*  33.0*  MCV 92.7  --  89.5 89.2 88.9 89.8  --   PLT 252  --  243 276 326 296  --    < > = values in this interval  not displayed.   Basic Metabolic Panel: Recent Labs  Lab 03/25/20 0816 03/25/20 1110 03/26/20 0225 03/26/20 0730 03/27/20 0412 03/27/20 0815 03/28/20 0310 03/29/20 0158 03/21/2020 0658 04/02/2020 1427  NA  --   --  136  --  135  --  132* 133* 131* 135  139  K  --    < > 5.1   < > 4.1  --  4.3 4.1 4.3 4.2  3.7  CL  --   --  102  --  98  --  97* 96* 95*  --   CO2  --   --  22  --  25  --  24 25 24   --   GLUCOSE  --   --  79  --  91  --  99 68* 95  --   BUN  --   --  48*  --  44*  --  42* 42* 45*  --   CREATININE  --   --  1.44*  --  1.39*  --  1.62* 1.82* 1.94*  --   CALCIUM 6.6*  --  6.5*  --  6.4* 6.9* 6.8* 6.8* 7.0*  --   MG 2.2  --   --   --  2.1  --   --  2.1  --   --   PHOS 3.3  --   --   --   --   --   --   --   --   --    < > = values in this interval not displayed.   GFR: Estimated Creatinine Clearance: 26 mL/min (A) (by C-G formula based on SCr of 1.94 mg/dL (H)). Liver Function Tests: Recent Labs  Lab  03/25/20 0816 03/26/20 0225 03/27/20 0412 03/28/20 0310  AST 28 27 21 23   ALT 24 23 19 21   ALKPHOS 105 98 88 94  BILITOT 1.0 1.0 1.1 1.1  PROT 6.4* 6.1* 5.5* 5.4*  ALBUMIN 2.6* 2.6* 2.3* 2.5*   No results for input(s): LIPASE, AMYLASE in the last 168 hours. No results for input(s): AMMONIA in the last 168 hours. Coagulation Profile: No results for input(s): INR, PROTIME in the last 168 hours. Cardiac Enzymes: No results for input(s): CKTOTAL, CKMB, CKMBINDEX, TROPONINI in the last 168 hours. BNP (last 3 results) No results for input(s): PROBNP in the last 8760 hours. HbA1C: No results for input(s): HGBA1C in the last 72 hours. CBG: Recent Labs  Lab 04/03/2020 0608 03/21/2020 0634 03/29/2020 1125 03/28/2020 1147 03/28/2020 1545  GLUCAP 60* 117* 66* 125* 70   Lipid Profile: No results for input(s): CHOL, HDL, LDLCALC, TRIG, CHOLHDL, LDLDIRECT in the last 72 hours. Thyroid Function Tests: No results for input(s): TSH, T4TOTAL, FREET4, T3FREE, THYROIDAB in the last 72 hours. Anemia Panel: No results for input(s): VITAMINB12, FOLATE, FERRITIN, TIBC, IRON, RETICCTPCT in the last 72 hours. Sepsis Labs: No results for input(s): PROCALCITON, LATICACIDVEN in the last 168 hours.  No results found for this or any previous visit (from the past 240 hour(s)).       Radiology Studies: CARDIAC CATHETERIZATION  Result Date: 03/27/2020 Findings: RA = 17 (v = 23) RV = 49/12 PA = 49/24 (35) PCW = 20 (v = 28) Fick cardiac output/index = 3.8/2.1 Thermo CO/CI = 3.7/2.1 PVR = 4.0 WU FA sat = 98% PA sat = 52%, 51% PaPI = 1.5 Assessment: 1. 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 Watch for increased AF rate. Prognosis concerning given hemodynamics. Glori Bickers, MD 3:15 PM    Scheduled Meds: . amiodarone  200 mg Oral Daily  . apixaban  5 mg Oral BID  . exemestane  25 mg Oral QPC  breakfast  . furosemide  80 mg Intravenous BID  . insulin aspart  0-15 Units Subcutaneous TID AC & HS  . lactose free nutrition  237 mL Oral BID BM  . metoprolol succinate  25 mg Oral QHS  . midodrine  2.5 mg Oral BID WC  . mirtazapine  7.5 mg Oral QHS  . morphine  15 mg Oral Q12H  . multivitamin with minerals  1 tablet Oral Daily  . OLANZapine  5 mg Oral QHS  . polyethylene glycol  17 g Oral BID  . senna  2 tablet Oral BID   Continuous Infusions: . milrinone 0.125 mcg/kg/min (03/23/2020 1652)  . ondansetron (ZOFRAN) IV       LOS: 12 days    Time spent: 32 minutes

## 2020-03-30 NOTE — Progress Notes (Signed)
Hypoglycemic Event  CBG: 66   Treatment: 25 mL dextrose IV  Symptoms: dizziness  Follow-up CBG: [id number] CBG Result: 125  Possible Reasons for Event: pt NPO  Comments/MD notified:N/A standing protocol followed    Laqueta Jean Jozi Malachi 12:13 PM

## 2020-03-30 NOTE — Progress Notes (Signed)
RHC Findings:  RA = 17 (v = 23) RV = 49/12 PA = 49/24 (35) PCW = 20 (v = 28) Fick cardiac output/index = 3.8/2.1 Thermo CO/CI = 3.7/2.1 PVR = 4.0 WU FA sat = 98%  PA sat = 52%, 51% PaPI = 1.5  Assessment: 1. 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 Watch for increased AF rate.   Prognosis concerning given hemodynamics.   Glori Bickers, MD  3:15 PM

## 2020-03-30 NOTE — H&P (View-Only) (Signed)
Advanced Heart Failure Rounding Note  PCP-Cardiologist: No primary care provider on file.     Patient Profile   74 y/o woman with recently diagnosed metastatic breast CA (undergoing XRT), CAD s/p CABG, DM2, systolic HF with EF 18-29% and PAF.   Admitted with sepsis and lactic acidosis due to severe LE cellulitis in setting of marked volume overload. Course complicated by AF with RVR.   Subjective:    Feels a bit better today. Still having some pain.   Lasix held yesterday for rising creatinine. Creatinine up again 1.8-> 1.9. Weight down 1 pound.  AF rate stable in 80s now. On Eliquis without bleeding (held for cath)  Objective:   Weight Range: 74.9 kg Body mass index is 28.36 kg/m.   Vital Signs:   Temp:  [97.3 F (36.3 C)-98.6 F (37 C)] 97.3 F (36.3 C) (08/27 0752) Pulse Rate:  [75-90] 86 (08/27 0752) Resp:  [13-19] 19 (08/27 0752) BP: (86-117)/(49-78) 91/78 (08/27 0752) SpO2:  [94 %-98 %] 97 % (08/27 0752) Weight:  [74.9 kg] 74.9 kg (08/27 0451) Last BM Date: 03/27/20  Weight change: Filed Weights   03/28/20 0506 03/29/20 0629 03/29/2020 0451  Weight: 74.9 kg 75.3 kg 74.9 kg    Intake/Output:   Intake/Output Summary (Last 24 hours) at 04/03/2020 0846 Last data filed at 03/29/2020 2010 Gross per 24 hour  Intake 360 ml  Output --  Net 360 ml      Physical Exam   General:  Sitting up in chair No resp difficulty HEENT: normal Neck: supple. JVP 7-8. Carotids 2+ bilat; no bruits. No lymphadenopathy or thryomegaly appreciated. Cor: PMI nondisplaced. irregular rate & rhythm. No rubs, gallops or murmurs. Lungs: clear Abdomen: soft, nontender, nondistended. No hepatosplenomegaly. No bruits or masses. Good bowel sounds. Extremities: no cyanosis, clubbing, rash, + UNNA 1+ edema Neuro: alert & orientedx3, cranial nerves grossly intact. moves all 4 extremities w/o difficulty. Affect pleasant    Telemetry   AF 80-90s Personally reviewed   Labs     CBC Recent Labs    03/28/20 0310 03/29/20 0158  WBC 8.9 8.0  NEUTROABS 6.8  --   HGB 9.9* 9.9*  HCT 32.8* 33.3*  MCV 88.9 89.8  PLT 326 937   Basic Metabolic Panel Recent Labs    03/29/20 0158 03/10/2020 0658  NA 133* 131*  K 4.1 4.3  CL 96* 95*  CO2 25 24  GLUCOSE 68* 95  BUN 42* 45*  CREATININE 1.82* 1.94*  CALCIUM 6.8* 7.0*  MG 2.1  --    Liver Function Tests Recent Labs    03/28/20 0310  AST 23  ALT 21  ALKPHOS 94  BILITOT 1.1  PROT 5.4*  ALBUMIN 2.5*   No results for input(s): LIPASE, AMYLASE in the last 72 hours. Cardiac Enzymes No results for input(s): CKTOTAL, CKMB, CKMBINDEX, TROPONINI in the last 72 hours.  BNP: BNP (last 3 results) Recent Labs    12/04/19 1324 03/18/20 0229  BNP 581.1* 912.0*    ProBNP (last 3 results) No results for input(s): PROBNP in the last 8760 hours.   D-Dimer No results for input(s): DDIMER in the last 72 hours. Hemoglobin A1C No results for input(s): HGBA1C in the last 72 hours. Fasting Lipid Panel No results for input(s): CHOL, HDL, LDLCALC, TRIG, CHOLHDL, LDLDIRECT in the last 72 hours. Thyroid Function Tests No results for input(s): TSH, T4TOTAL, T3FREE, THYROIDAB in the last 72 hours.  Invalid input(s): FREET3  Other results:   Imaging  No results found.   Medications:     Scheduled Medications:  amiodarone  200 mg Oral Daily   apixaban  5 mg Oral BID   exemestane  25 mg Oral QPC breakfast   insulin aspart  0-15 Units Subcutaneous TID AC & HS   lactose free nutrition  237 mL Oral BID BM   metoprolol succinate  25 mg Oral QHS   midodrine  2.5 mg Oral BID WC   mirtazapine  7.5 mg Oral QHS   morphine  15 mg Oral Q12H   multivitamin with minerals  1 tablet Oral Daily   OLANZapine  5 mg Oral QHS   polyethylene glycol  17 g Oral BID   senna  2 tablet Oral BID   sodium chloride flush  3 mL Intravenous Q12H    Infusions:  sodium chloride     sodium chloride      ondansetron (ZOFRAN) IV      PRN Medications: sodium chloride, acetaminophen **OR** acetaminophen, HYDROmorphone (DILAUDID) injection, hydrOXYzine, LORazepam, morphine, ondansetron **OR** ondansetron (ZOFRAN) IV, sodium chloride, sodium chloride flush   Assessment/Plan    1. Atrial Fibrillation with RVR - S/p MAZE and LAA clipping at time of CABG in 11/2016. S/p DCCV in 09/2015 and more recently 12/09/2019. Noted to be back in atrial fibrillation at last office visit on 01/30/2020 and started on low dose PO Amiodarone. - Presented in atrial fibrillation with RVR in the setting of sepsis secondary to cellulitis of bilateral lower extremities. Difficult situation complicated by patient's intolerance to multiple cardiac meds.  - Not been able to tolerate IV Amiodarone due to severe nausea. - Not candidate for CCB or dig - continue to treat w/ low dose PO amio, 200 mg daily  (refuses higher dose) - Tolerating low-dose oral amio and Toprol 25. BP stable with midodrine support. May be able to stop amio - Continue Eliquis 5mg  twice daily.  No bleeding   2. Acute on Chronic Combined CHF - History of ischemic cardiomyopathy. PYP in the past negative for amyloid. - Most recent Echo in 12/2019 showed LVEF of 35-40% with severe asymmetric LVH, mild MR, moderate TR, and severely elevated PASP. - Chest x-ray showed cardiomegaly with mildly enlarged central pulmonary vessel but no overt edema. BNP 912 - Home Lasix held on admit and patient actually given some IV fluids on admission due to sepsis/ AKI. Diuretics restarted 8/17.  - Legs look better but weight and creatinine trending up. I think overall volume status doesn't look to bad but she insists it remains elevated so much so that she can't walk. In reviewing her chart her weight is actually down 20 pounds from several months ago. Lasix is on hol. RHC today to assess volume status and output - intolerant to Losartan due to hyperkalemia  - Continue unna  boots   3. CAD  - S/p CABG x4 in 11/2016.  - High-sensitivity troponin minimally elevated and flat at 22 >> 24. Not consistent with ACS. Suspect demand ischemia in setting of sepsis and atrial fibrillation with RVR. - No s/s angina - No Aspirin with need for Eliquis.  - Continue statin.  4. 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 Had VF arrest in 11/2016 with appropriate ICD shock.  - No recurrent VT/VF - Had not been able to tolerate Amiodarone or beta-blocker in the past.  5. Sepsis Secondary to Bilateral Lower Extremity Cellulitis - initially placed on broad spectrum abx w/ vanc + meropenum. AF. Lactic  acid normalized.  Blood cultures NGTD  - has completed course of abx, AF.  - Resolved  6. 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 - Diagnosed in 12/2019. Lynch syndrome diagnosed by genetic study.  - Follows with Dr. Marin Olp as outpatient. On immunotherapy   7. Hyperkalemia - required lokelma this admit  - K stable today at 4.3  8. Type 2 Diabetes Mellitus - Management per primary team.  9. Acute on Chronic Kidney Disease - baseline SCr 1.0-1.30 range  - Creatinine 2.20 on admission, 2/2 sepsis and deceased PO intake with constant nausea and frequent vomiting -Overall improved but trending back up w/ diuresis,  1.4>>1.6 >> 1.8 -> 1.9 today  - Holding lasix. RHC today - Would avoid toradol if at all possible with AKI  Length of Stay: [place], MD  03/06/2020, 8:46 AM   Advanced Heart Failure Team Pager [phone] (M-F; 7a - 4p)  Please contact [facility] for night-coverage after hours (4p -7a ) and weekends on [email or web address]

## 2020-03-30 NOTE — Progress Notes (Signed)
[name] is going to undergo a cardiac cath today. This is being done by cardiology to see exactly what her cardiac function is.  Her legs look a little better.  She still having some pain over on the left side. I know the Toradol works well for her. Will order some more for her.  There are no labs back yet for today.  She seems to be eating pretty well. She has had no nausea.  She is sleeping okay.  There are no problems with going to the bathroom. She is now having diarrhea.  She is not had any issues with fever. She has had no headache. She has had no cough.  Overall, her vital signs show temperature 97.8. Pulse 85. Blood pressure 101/63. Her lungs are clear bilaterally. I hear no wheezes. Cardiac exam irregular rate and irregular rhythm consistent with atrial for ablation. The rate does seem to be fairly well controlled. Abdomen is soft. There are good bowel sounds. She has no fluid wave. Back exam shows some tenderness over in the left upper back to palpation. Neurological exam is nonfocal.  It will be very interesting to see what the cardiac cath shows. We will see what the ejection fraction is.  Hopefully, labs will be back later on today.  I do know that the staff on [address] are doing a great job with her. I appreciate their compassion.  Kayla Haw, MD  [name] 3:35

## 2020-03-30 NOTE — Progress Notes (Signed)
PT Cancellation Note  Patient Details Name: [name] MRN: [id number] DOB: [birth year]   Cancelled Treatment:    Reason Eval/Treat Not Completed: Pain limiting ability to participate Pt tearful sitting EOB reporting pain is not improved, awaiting toradol. Declining activity/therapy due to pain. Will follow up as time allows.   Marguarite Arbour A Lemon Whitacre 03/29/2020, 8:39 AM Marisa Severin, PT, DPT Acute Rehabilitation Services Pager [phone] Office [phone]

## 2020-03-30 NOTE — Interval H&P Note (Signed)
History and Physical Interval Note:  03/29/2020 2:06 PM  [name]  has presented today for surgery, with the diagnosis of heart failure.  The various methods of treatment have been discussed with the patient and family. After consideration of risks, benefits and other options for treatment, the patient has consented to  Procedure(s): RIGHT HEART CATH (N/A) as a surgical intervention.  The patient's history has been reviewed, patient examined, no change in status, stable for surgery.  I have reviewed the patient's chart and labs.  Questions were answered to the patient's satisfaction.     Shuayb Schepers

## 2020-03-30 NOTE — Progress Notes (Signed)
Advanced Heart Failure Rounding Note  PCP-Cardiologist: No primary care provider on file.     Patient Profile   74 y/o woman with recently diagnosed metastatic breast CA (undergoing XRT), CAD s/p CABG, DM2, systolic HF with EF 84-53% and PAF.   Admitted with sepsis and lactic acidosis due to severe LE cellulitis in setting of marked volume overload. Course complicated by AF with RVR.   Subjective:    Feels a bit better today. Still having some pain.   Lasix held yesterday for rising creatinine. Creatinine up again 1.8-> 1.9. Weight down 1 pound.  AF rate stable in 80s now. On Eliquis without bleeding (held for cath)  Objective:   Weight Range: 74.9 kg Body mass index is 28.36 kg/m.   Vital Signs:   Temp:  [97.3 F (36.3 C)-98.6 F (37 C)] 97.3 F (36.3 C) (08/27 0752) Pulse Rate:  [75-90] 86 (08/27 0752) Resp:  [13-19] 19 (08/27 0752) BP: (86-117)/(49-78) 91/78 (08/27 0752) SpO2:  [94 %-98 %] 97 % (08/27 0752) Weight:  [74.9 kg] 74.9 kg (08/27 0451) Last BM Date: 03/27/20  Weight change: Filed Weights   03/28/20 0506 03/29/20 0629 04/01/2020 0451  Weight: 74.9 kg 75.3 kg 74.9 kg    Intake/Output:   Intake/Output Summary (Last 24 hours) at 03/17/2020 0846 Last data filed at 03/29/2020 2010 Gross per 24 hour  Intake 360 ml  Output --  Net 360 ml      Physical Exam   General:  Sitting up in chair No resp difficulty HEENT: normal Neck: supple. JVP 7-8. Carotids 2+ bilat; no bruits. No lymphadenopathy or thryomegaly appreciated. Cor: PMI nondisplaced. irregular rate & rhythm. No rubs, gallops or murmurs. Lungs: clear Abdomen: soft, nontender, nondistended. No hepatosplenomegaly. No bruits or masses. Good bowel sounds. Extremities: no cyanosis, clubbing, rash, + UNNA 1+ edema Neuro: alert & orientedx3, cranial nerves grossly intact. moves all 4 extremities w/o difficulty. Affect pleasant    Telemetry   AF 80-90s Personally reviewed   Labs     CBC Recent Labs    03/28/20 0310 03/29/20 0158  WBC 8.9 8.0  NEUTROABS 6.8  --   HGB 9.9* 9.9*  HCT 32.8* 33.3*  MCV 88.9 89.8  PLT 326 646   Basic Metabolic Panel Recent Labs    03/29/20 0158 03/06/2020 0658  NA 133* 131*  K 4.1 4.3  CL 96* 95*  CO2 25 24  GLUCOSE 68* 95  BUN 42* 45*  CREATININE 1.82* 1.94*  CALCIUM 6.8* 7.0*  MG 2.1  --    Liver Function Tests Recent Labs    03/28/20 0310  AST 23  ALT 21  ALKPHOS 94  BILITOT 1.1  PROT 5.4*  ALBUMIN 2.5*   No results for input(s): LIPASE, AMYLASE in the last 72 hours. Cardiac Enzymes No results for input(s): CKTOTAL, CKMB, CKMBINDEX, TROPONINI in the last 72 hours.  BNP: BNP (last 3 results) Recent Labs    12/04/19 1324 03/18/20 0229  BNP 581.1* 912.0*    ProBNP (last 3 results) No results for input(s): PROBNP in the last 8760 hours.   D-Dimer No results for input(s): DDIMER in the last 72 hours. Hemoglobin A1C No results for input(s): HGBA1C in the last 72 hours. Fasting Lipid Panel No results for input(s): CHOL, HDL, LDLCALC, TRIG, CHOLHDL, LDLDIRECT in the last 72 hours. Thyroid Function Tests No results for input(s): TSH, T4TOTAL, T3FREE, THYROIDAB in the last 72 hours.  Invalid input(s): FREET3  Other results:   Imaging  No results found.   Medications:     Scheduled Medications:  amiodarone  200 mg Oral Daily   apixaban  5 mg Oral BID   exemestane  25 mg Oral QPC breakfast   insulin aspart  0-15 Units Subcutaneous TID AC & HS   lactose free nutrition  237 mL Oral BID BM   metoprolol succinate  25 mg Oral QHS   midodrine  2.5 mg Oral BID WC   mirtazapine  7.5 mg Oral QHS   morphine  15 mg Oral Q12H   multivitamin with minerals  1 tablet Oral Daily   OLANZapine  5 mg Oral QHS   polyethylene glycol  17 g Oral BID   senna  2 tablet Oral BID   sodium chloride flush  3 mL Intravenous Q12H    Infusions:  sodium chloride     sodium chloride      ondansetron (ZOFRAN) IV      PRN Medications: sodium chloride, acetaminophen **OR** acetaminophen, HYDROmorphone (DILAUDID) injection, hydrOXYzine, LORazepam, morphine, ondansetron **OR** ondansetron (ZOFRAN) IV, sodium chloride, sodium chloride flush   Assessment/Plan    1. Atrial Fibrillation with RVR - S/p MAZE and LAA clipping at time of CABG in 11/2016. S/p DCCV in 09/2015 and more recently 12/09/2019. Noted to be back in atrial fibrillation at last office visit on 01/30/2020 and started on low dose PO Amiodarone. - Presented in atrial fibrillation with RVR in the setting of sepsis secondary to cellulitis of bilateral lower extremities. Difficult situation complicated by patient's intolerance to multiple cardiac meds.  - Not been able to tolerate IV Amiodarone due to severe nausea. - Not candidate for CCB or dig - continue to treat w/ low dose PO amio, 200 mg daily  (refuses higher dose) - Tolerating low-dose oral amio and Toprol 25. BP stable with midodrine support. May be able to stop amio - Continue Eliquis 5mg  twice daily.  No bleeding   2. Acute on Chronic Combined CHF - History of ischemic cardiomyopathy. PYP in the past negative for amyloid. - Most recent Echo in 12/2019 showed LVEF of 35-40% with severe asymmetric LVH, mild MR, moderate TR, and severely elevated PASP. - Chest x-ray showed cardiomegaly with mildly enlarged central pulmonary vessel but no overt edema. BNP 912 - Home Lasix held on admit and patient actually given some IV fluids on admission due to sepsis/ AKI. Diuretics restarted 8/17.  - Legs look better but weight and creatinine trending up. I think overall volume status doesn't look to bad but she insists it remains elevated so much so that she can't walk. In reviewing her chart her weight is actually down 20 pounds from several months ago. Lasix is on hol. RHC today to assess volume status and output - intolerant to Losartan due to hyperkalemia  - Continue unna  boots   3. CAD  - S/p CABG x4 in 11/2016.  - High-sensitivity troponin minimally elevated and flat at 22 >> 24. Not consistent with ACS. Suspect demand ischemia in setting of sepsis and atrial fibrillation with RVR. - No s/s angina - No Aspirin with need for Eliquis.  - Continue statin.  4. 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 Had VF arrest in 11/2016 with appropriate ICD shock.  - No recurrent VT/VF - Had not been able to tolerate Amiodarone or beta-blocker in the past.  5. Sepsis Secondary to Bilateral Lower Extremity Cellulitis - initially placed on broad spectrum abx w/ vanc + meropenum. AF. Lactic  acid normalized.  Blood cultures NGTD  - has completed course of abx, AF.  - Resolved  6. 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 - Diagnosed in 12/2019. Lynch syndrome diagnosed by genetic study.  - Follows with Dr. Marin Olp as outpatient. On immunotherapy   7. Hyperkalemia - required lokelma this admit  - K stable today at 4.3  8. Type 2 Diabetes Mellitus - Management per primary team.  9. Acute on Chronic Kidney Disease - baseline SCr 1.0-1.30 range  - Creatinine 2.20 on admission, 2/2 sepsis and deceased PO intake with constant nausea and frequent vomiting -Overall improved but trending back up w/ diuresis,  1.4>>1.6 >> 1.8 -> 1.9 today  - Holding lasix. RHC today - Would avoid toradol if at all possible with AKI  Length of Stay: [place], [place]  03/27/2020, 8:46 AM   Advanced Heart Failure Team Pager [phone] (M-F; 7a - 4p)  Please contact [facility] for night-coverage after hours (4p -7a ) and weekends on [email or web address]

## 2020-03-31 LAB — BASIC METABOLIC PANEL
Anion gap: 14 (ref 5–15)
BUN: 52 mg/dL — ABNORMAL HIGH (ref 8–23)
CO2: 24 mmol/L (ref 22–32)
Calcium: 6.8 mg/dL — ABNORMAL LOW (ref 8.9–10.3)
Chloride: 97 mmol/L — ABNORMAL LOW (ref 98–111)
Creatinine, Ser: 2.18 mg/dL — ABNORMAL HIGH (ref 0.44–1.00)
GFR calc Af Amer: 25 mL/min — ABNORMAL LOW (ref >60)
GFR calc non Af Amer: 22 mL/min — ABNORMAL LOW (ref >60)
Glucose, Bld: 96 mg/dL (ref 70–99)
Potassium: 4.4 mmol/L (ref 3.5–5.1)
Sodium: 135 mmol/L (ref 135–145)

## 2020-03-31 LAB — GLUCOSE, CAPILLARY
Glucose-Capillary: 104 mg/dL — ABNORMAL HIGH (ref 70–99)
Glucose-Capillary: 177 mg/dL — ABNORMAL HIGH (ref 70–99)
Glucose-Capillary: 158 mg/dL — ABNORMAL HIGH (ref 70–99)
Glucose-Capillary: 148 mg/dL — ABNORMAL HIGH (ref 70–99)

## 2020-03-31 MED ORDER — MIDODRINE HCL 5 MG PO TABS
5.0000 mg | ORAL_TABLET | Freq: Two times a day (BID) | ORAL | Status: DC
  Administered 2020-03-31 – 2020-04-06 (×12): 5 mg via ORAL
  Filled 2020-03-31 (×12): qty 1

## 2020-03-31 NOTE — Progress Notes (Addendum)
Progress Note  Patient Name: [name] Date of Encounter: 03/31/2020    Subjective   Sitting up in bed, eating.  Still in pain.  Shortness of breath mildly improved.  Mild urine output.  Inpatient Medications    Scheduled Meds: . amiodarone  200 mg Oral Daily  . apixaban  5 mg Oral BID  . exemestane  25 mg Oral QPC breakfast  . furosemide  80 mg Intravenous BID  . insulin aspart  0-15 Units Subcutaneous TID AC & HS  . lactose free nutrition  237 mL Oral BID BM  . metoprolol succinate  25 mg Oral QHS  . midodrine  2.5 mg Oral BID WC  . mirtazapine  7.5 mg Oral QHS  . morphine  15 mg Oral Q12H  . multivitamin with minerals  1 tablet Oral Daily  . OLANZapine  5 mg Oral QHS  . polyethylene glycol  17 g Oral BID  . senna  2 tablet Oral BID   Continuous Infusions: . milrinone 0.125 mcg/kg/min (03/20/2020 1652)  . ondansetron (ZOFRAN) IV     PRN Meds: acetaminophen **OR** acetaminophen, HYDROmorphone (DILAUDID) injection, hydrOXYzine, LORazepam, morphine, ondansetron **OR** ondansetron (ZOFRAN) IV, sodium chloride   Vital Signs    Vitals:   03/31/20 0332 03/31/20 0500 03/31/20 1049 03/31/20 1141  BP: (!) 82/58 (!) 98/54 (!) 90/56 (!) 88/66  Pulse: 85 98  (!) 104  Resp:  15  19  Temp:    (!) 97.5 F (36.4 C)  TempSrc:    Oral  SpO2: 92%   97%  Weight:  75.1 kg    Height:        Intake/Output Summary (Last 24 hours) at 03/31/2020 1201 Last data filed at 03/19/2020 2130 Gross per 24 hour  Intake 240 ml  Output --  Net 240 ml   Last 3 Weights 03/31/2020 04/03/2020 03/29/2020  Weight (lbs) 165 lb 9.1 oz 165 lb 3.2 oz 166 lb 0.1 oz  Weight (kg) 75.1 kg 74.934 kg 75.3 kg  Some encounter information is confidential and restricted. Go to Review Flowsheets activity to see all data.      Telemetry    No adverse arrhythmias, atrial fibrillation- Personally Reviewed  ECG    Atrial fibrillation- Personally Reviewed  Physical Exam   GEN: No acute distress.    Neck: No JVD Cardiac:  Irregularly irregular, normal rate, no murmurs, rubs, or gallops.  Respiratory:  Minimal crackles at base bilaterally. GI: Soft, nontender, non-distended  MS: No edema; No deformity. Neuro:  Nonfocal  Psych: Normal affect   Labs    High Sensitivity Troponin:   Recent Labs  Lab 03/05/2020 1905 03/14/2020 2206  TROPONINIHS 22* 24*      Chemistry Recent Labs  Lab 03/26/20 0225 03/26/20 0730 03/27/20 0412 03/27/20 0815 03/28/20 0310 03/28/20 0310 03/29/20 0158 03/29/20 0158 03/26/2020 0658 03/14/2020 1427 03/31/20 0708  NA 136  --  135  --  132*   < > 133*   < > 131* 135  139 135  K 5.1   < > 4.1  --  4.3   < > 4.1   < > 4.3 4.2  3.7 4.4  CL 102  --  98  --  97*   < > 96*  --  95*  --  97*  CO2 22  --  25  --  24   < > 25  --  24  --  24  GLUCOSE 79  --  91  --  99   < > 68*  --  95  --  96  BUN 48*  --  44*  --  42*   < > 42*  --  45*  --  52*  CREATININE 1.44*  --  1.39*  --  1.62*   < > 1.82*  --  1.94*  --  2.18*  CALCIUM 6.5*  --  6.4*   < > 6.8*   < > 6.8*  --  7.0*  --  6.8*  PROT 6.1*  --  5.5*  --  5.4*  --   --   --   --   --   --   ALBUMIN 2.6*  --  2.3*  --  2.5*  --   --   --   --   --   --   AST 27  --  21  --  23  --   --   --   --   --   --   ALT 23  --  19  --  21  --   --   --   --   --   --   ALKPHOS 98  --  88  --  94  --   --   --   --   --   --   BILITOT 1.0  --  1.1  --  1.1  --   --   --   --   --   --   GFRNONAA 36*  --  38*  --  31*   < > 27*  --  25*  --  22*  GFRAA 42*  --  44*  --  36*   < > 32*  --  29*  --  25*  ANIONGAP 12  --  12  --  11   < > 12  --  12  --  14   < > = values in this interval not displayed.     Hematology Recent Labs  Lab 03/27/20 0412 03/27/20 0412 03/28/20 0310 03/29/20 0158 03/12/2020 1427  WBC 7.9  --  8.9 8.0  --   RBC 3.53*  --  3.69* 3.71*  --   HGB 9.6*   < > 9.9* 9.9* 11.9*  11.2*  HCT 31.5*   < > 32.8* 33.3* 35.0*  33.0*  MCV 89.2  --  88.9 89.8  --   MCH 27.2  --  26.8 26.7   --   MCHC 30.5  --  30.2 29.7*  --   RDW 22.7*  --  22.5* 22.6*  --   PLT 276  --  326 296  --    < > = values in this interval not displayed.    BNPNo results for input(s): BNP, PROBNP in the last 168 hours.   DDimer No results for input(s): DDIMER in the last 168 hours.   Radiology    CARDIAC CATHETERIZATION  Result Date: 03/06/2020 Findings: RA = 17 (v = 23) RV = 49/12 PA = 49/24 (35) PCW = 20 (v = 28) Fick cardiac output/index = 3.8/2.1 Thermo CO/CI = 3.7/2.1 PVR = 4.0 WU FA sat = 98% PA sat = 52%, 51% PaPI = 1.5 Assessment: 1. Biventricular heart failure (R>L) with low output and volume overload Plan/Discussion: Will start milrinone and resume IV lasix to see if we can diurese. Watch for increased AF rate. Prognosis concerning given hemodynamics. Quillian Quince  Bensimhon, MD 3:15 PM   Cardiac Studies   Right heart catheterization reviewed-right heart failure noted.  Patient Profile     74 y.o. female with multiple medical issues including breast cancer with metastasis, cellulitis, atrial fibrillation, systolic heart failure, CAD post CABG  Assessment & Plan    Acute on chronic systolic heart failure -Heart failure recommendations were to continue with milrinone infusion as well as IV Lasix. -Continuing to monitor daily weights, renal function. -Right heart catheterization demonstrated severe right heart failure. -Given the ongoing hypotension, I will go ahead and discontinue her low-dose Toprol-XL.  ICD -Prior V. fib arrest 2016 with ICD placement.  Hypotension -Partially a result of right heart failure.  Also on midodrine 2.5 twice daily.  Paroxysmal atrial fibrillation -Continue with amiodarone 200 mg a day.  Breast cancer with metastasis -Dr. Antonieta Pert note reviewed.  Palliation important.       For questions or updates, please contact [place] Please consult [email or web address] for contact info under        Signed, Candee Furbish, MD  03/31/2020, 12:01 PM

## 2020-03-31 NOTE — Progress Notes (Signed)
Text page Dr. Jeronimo Greaves Patient B.P. low rt arm 83/43 map 56 lt arm 82/58 map 68 . Patient tol. well. Skin warm and dry Tim R.N. aware

## 2020-03-31 NOTE — Progress Notes (Signed)
PROGRESS NOTE    [name]  PYP:[id number] DOB: March 06, 1946 DOA: 03/14/2020 PCP: Patient, No Pcp Per    Brief Narrative:  [name] is a 74 y.o. 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 fib status post ICD, breast cancer with bone mets/Lynch syndrome who presented on 8/14 with several days of shortness of breath, worsening redness of lower legs and weeping lesions for the past 2 weeks in setting of using Derm stone to scrape her dry skin and found to have A. fib with RVR to the 120s and sepsis physiology (white count of 24.5, lactic acid 2.2, Respiratory rate of 21) secondary to bilateral lower extremity cellulitis. course complicated by A. fib with RVR, AKI on CKD with hyperkalemia.   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   -Presented to the ER with tachycardia, tachypnea, leukocytosis in the setting of extensive left thigh cellulitis with associated AKI.  Patient was initially started on empiric antibiotics with vancomycin and meropenem which was transitioned to ceftriaxone in which she completed a 7-day course ending on 03/25/2020.  A. fib with RVR. Likely exacerbated in setting of sepsis  due to above, persists.  Heart rate acceptable now on amiodarone and metoprolol. On Eliquis   Acute on chronic, systolic/diastolic CHF.   Still quite volume overloaded. slight improvement in bilateral lower extremity swelling with 2-3+ pitting edema bilateral lower extremities. BNP in the 900s on admission.  Intolerant to losartan in the past due to hyperkalemia. --Cardiology/heart failure service following, appreciate assistance --Right heart cath today shows severe heart failure right more than left. --Continue Unna boots --Continue monitor strict I's and O's and daily weights --Started on IV Lasix and milrinone infusion.  Hypotension Blood pressure remains low. --continue midodrine 2.5 mg BID per Cardiology recommendations --monitor closely while initiated on Toprol-XL  Breast cancer with bone metastasis: --Followed by Dr. Marin Olp.  Patient is on pain medications with MS Contin, short-acting morphine and as needed Dilaudid. --Patient is currently on immunotherapy. --Prognosis remains poor given metastatic breast cancer along with severe cardiovascular morbidities. --Appreciate palliative care involvement. --Home with home palliative care.  History of VT/V. fib, status post ICD (2016), history of V. fib arrest in 11/2016 -Has not been able to tolerate amiodarone or beta blockade in the past -Currently on amiodarone 200 mg daily --Continue to monitor on telemetry  Type 2 diabetes Hemoglobin A1c A1c 7.2.  -Holding home glipizide -Monitor CBG, sliding scale as needed --Nutrition consulted to assist with diet supplementation  Acute on CKD with hyperkalemia    Peak creatinine of 2.2, baseline of ~1.6. Required lokelma on 8/20 for recurrent hyperkalemia, now improved at 4.3. --Cr 2.2>1.9>1.69>1.8>1.54>1.44>1.39>1.62>1.82-1.9-2.1. --Avoid NSAIDs and Toradol. --Monitor BMP daily --Continue to monitor urinary output closely  Patient currently hemodynamically stable.  She has  multiple cardiovascular comorbidities and metastatic breast cancer with ongoing metastatic pain.  Not able to give much of the IV narcotics.  Her overall prognosis is poor and may need to be comfort care status to use more increased doses of pain medication safely. I discussed this with patient and she is overwhelmed. I will reconsult palliative care today to help counsel, coordinate and support and readdress goals of care. Actively followed by her oncology and cardiology.   DVT prophylaxis: Eliquis Code Status: DNR Family Communication: None present at bedside  Disposition Plan:  Status is: Inpatient  Remains inpatient appropriate because:Ongoing diagnostic testing needed not appropriate for outpatient work up, Unsafe d/c plan, IV treatments appropriate due to intensity of illness or inability to take PO and Inpatient level of care appropriate due to severity of illness   Dispo: The patient is from: Home              Anticipated d/c is to: Home              Anticipated d/c date is: 2 days              Patient currently is not medically stable to d/c.   Consultants:   Cardiology, heart failure service  Oncology  Procedures:   Venous duplex ultrasound lower extremities 03/21/2020  Antimicrobials:   Vancomycin 8/15 - 8/17  Meropenem 8/15 - 8/17  Ceftriaxone 8/17 - 8/22   Subjective: Patient seen and examined.  Multiple complaints.  Dry skin.  Tearful at times with unable to take IV Dilaudid because of low blood pressures.  At times she talks about spreading cancer and suffering and not wanting to suffer more.  She also talks about doing the best treatment and improving. Nursing reported difficulty giving Dilaudid with low systolic blood pressures.   Objective: Vitals:   03/31/20 0330 03/31/20 0332 03/31/20 0500 03/31/20 1049  BP: (!) 83/43 (!) 82/58 (!) 98/54 (!) 90/56  Pulse: 85 85 98   Resp: 18  15   Temp: (!) 97.5 F (36.4 C)     TempSrc: Oral     SpO2: 96%  92%    Weight:   75.1 kg   Height:        Intake/Output Summary (Last 24 hours) at 03/31/2020 1123 Last data filed at 03/16/2020 2130 Gross per 24 hour  Intake 240 ml  Output --  Net 240 ml   Filed Weights   03/29/20 0629 03/12/2020 0451 03/31/20 0500  Weight: 75.3 kg 74.9 kg 75.1 kg    Examination:  General exam: Appears calm and comfortable  Mostly on room air.  Anxious and tearful. Respiratory system: Clear to auscultation. Respiratory effort normal. Cardiovascular system: S1 & S2 heard, RRR. No JVD, murmurs, rubs, gallops or clicks.  2+ pitting edema up to knees with Unna boots in place Gastrointestinal system: Abdomen is nondistended, soft and nontender. No organomegaly or masses felt. Normal bowel sounds heard. Central nervous system: Alert and oriented. No focal neurological deficits. Extremities: Symmetric 5 x 5 power. Skin: No rashes, lesions or ulcers   Data Reviewed: I have personally reviewed following labs and imaging studies  CBC: Recent Labs  Lab 03/25/20 1110 03/25/20 1110 03/26/20 0225 03/27/20 0412 03/28/20 0310 03/29/20 0158 03/21/2020 1427  WBC 11.4*  --  9.4 7.9 8.9 8.0  --   NEUTROABS  --   --  7.9* 6.2 6.8  --   --   HGB 11.5*   < >  10.9* 9.6* 9.9* 9.9* 11.9*  11.2*  HCT 39.3   < > 36.5 31.5* 32.8* 33.3* 35.0*  33.0*  MCV 92.7  --  89.5 89.2 88.9 89.8  --   PLT 252  --  243 276 326 296  --    < > = values in this interval not displayed.   Basic Metabolic Panel: Recent Labs  Lab 03/25/20 0816 03/25/20 0816 03/25/20 1110 03/27/20 0412 03/27/20 0412 03/27/20 0815 03/28/20 0310 03/29/20 0158 03/11/2020 0658 03/12/2020 1427 03/31/20 0708  NA  --   --    < > 135   < >  --  132* 133* 131* 135  139 135  K  --   --    < > 4.1   < >  --  4.3 4.1 4.3 4.2  3.7 4.4  CL  --   --    < > 98  --   --  97* 96* 95*  --  97*  CO2  --   --    < > 25  --   --  24 25 24   --  24  GLUCOSE  --   --    < > 91  --   --  99 68* 95  --  96  BUN  --   --    < >  44*  --   --  42* 42* 45*  --  52*  CREATININE  --   --    < > 1.39*  --   --  1.62* 1.82* 1.94*  --  2.18*  CALCIUM 6.6*   < >   < > 6.4*  --  6.9* 6.8* 6.8* 7.0*  --  6.8*  MG 2.2  --   --  2.1  --   --   --  2.1  --   --   --   PHOS 3.3  --   --   --   --   --   --   --   --   --   --    < > = values in this interval not displayed.   GFR: Estimated Creatinine Clearance: 23.2 mL/min (A) (by C-G formula based on SCr of 2.18 mg/dL (H)). Liver Function Tests: Recent Labs  Lab 03/25/20 0816 03/26/20 0225 03/27/20 0412 03/28/20 0310  AST 28 27 21 23   ALT 24 23 19 21   ALKPHOS 105 98 88 94  BILITOT 1.0 1.0 1.1 1.1  PROT 6.4* 6.1* 5.5* 5.4*  ALBUMIN 2.6* 2.6* 2.3* 2.5*   No results for input(s): LIPASE, AMYLASE in the last 168 hours. No results for input(s): AMMONIA in the last 168 hours. Coagulation Profile: No results for input(s): INR, PROTIME in the last 168 hours. Cardiac Enzymes: No results for input(s): CKTOTAL, CKMB, CKMBINDEX, TROPONINI in the last 168 hours. BNP (last 3 results) No results for input(s): PROBNP in the last 8760 hours. HbA1C: No results for input(s): HGBA1C in the last 72 hours. CBG: Recent Labs  Lab 03/07/2020 1125 03/09/2020 1147 03/10/2020 1545 03/22/2020 2132 03/31/20 0609  GLUCAP 66* 125* 70 142* 104*   Lipid Profile: No results for input(s): CHOL, HDL, LDLCALC, TRIG, CHOLHDL, LDLDIRECT in the last 72 hours. Thyroid Function Tests: No results for input(s): TSH, T4TOTAL, FREET4, T3FREE, THYROIDAB in the last 72 hours. Anemia Panel: No results for input(s): VITAMINB12, FOLATE, FERRITIN, TIBC, IRON, RETICCTPCT in the last 72 hours. Sepsis Labs: No results for input(s): PROCALCITON, LATICACIDVEN in  the last 168 hours.  No results found for this or any previous visit (from the past 240 hour(s)).       Radiology Studies: CARDIAC CATHETERIZATION  Result Date: 03/13/2020 Findings: RA = 17 (v = 23) RV = 49/12 PA = 49/24 (35) PCW = 20 (v = 28) Fick  cardiac output/index = 3.8/2.1 Thermo CO/CI = 3.7/2.1 PVR = 4.0 WU FA sat = 98% PA sat = 52%, 51% PaPI = 1.5 Assessment: 1. Biventricular heart failure (R>L) with low output and volume overload Plan/Discussion: Will start milrinone and resume IV lasix to see if we can diurese. Watch for increased AF rate. Prognosis concerning given hemodynamics. Glori Bickers, MD 3:15 PM    Scheduled Meds: . amiodarone  200 mg Oral Daily  . apixaban  5 mg Oral BID  . exemestane  25 mg Oral QPC breakfast  . furosemide  80 mg Intravenous BID  . insulin aspart  0-15 Units Subcutaneous TID AC & HS  . lactose free nutrition  237 mL Oral BID BM  . metoprolol succinate  25 mg Oral QHS  . midodrine  2.5 mg Oral BID WC  . mirtazapine  7.5 mg Oral QHS  . morphine  15 mg Oral Q12H  . multivitamin with minerals  1 tablet Oral Daily  . OLANZapine  5 mg Oral QHS  . polyethylene glycol  17 g Oral BID  . senna  2 tablet Oral BID   Continuous Infusions: . milrinone 0.125 mcg/kg/min (03/27/2020 1652)  . ondansetron (ZOFRAN) IV       LOS: 13 days    Time spent: 32 minutes

## 2020-03-31 NOTE — Progress Notes (Signed)
Ms. Hagin is about the same. She had a cardiac cath yesterday. It certainly does not look good from what I can tell. She still has a low output state.  She still has swelling in her legs.. She is started on milrinone. Hopefully this will help increase cardiac output and she will be able to get rid of some of the fluid. I think if she get rid of some of the fluid, then maybe her pain will get better.  She still has atrial fibrillation. Her rate seems to be a little better control.  There is no labs back yet.  She is on the Aromasin for her breast cancer. She does get the Faslodex monthly. She is due for next pembrolizumab dose when she gets out of the hospital.  She slept well. There is no nausea which is a blessing. She is able to eat a little bit more.  Her vital signs show temperature of 97.5. Pulse 98. Blood pressure 98/54. Her lungs sound pretty clear bilaterally. She has good air movement bilaterally. Cardiac exam irregular rate and rhythm. The rate is fairly well controlled. Abdomen is soft. Bowel sounds are slightly decreased. Extremities shows wrappings around her legs. She has chronic swelling in her legs. Neurological exam is nonfocal.  Right now, cardiology is trying their best to get her congestive heart failure improved. I realize that with right-sided heart failure, this can be very difficult to improve.  Hopefully, her pain will be under little better control. We can be little more liberal with giving her pain medication. Again, she is a DO NOT RESUSCITATE so we really have to make sure that she is comfortable.  I know that the staff on [address] are doing a great job with her.   Lattie Haw, MD  [facility] [phone]

## 2020-04-01 ENCOUNTER — Inpatient Hospital Stay: Payer: Self-pay

## 2020-04-01 LAB — BASIC METABOLIC PANEL
Anion gap: 10 (ref 5–15)
BUN: 53 mg/dL — ABNORMAL HIGH (ref 8–23)
CO2: 24 mmol/L (ref 22–32)
Calcium: 6.5 mg/dL — ABNORMAL LOW (ref 8.9–10.3)
Chloride: 99 mmol/L (ref 98–111)
Creatinine, Ser: 2.18 mg/dL — ABNORMAL HIGH (ref 0.44–1.00)
GFR calc Af Amer: 25 mL/min — ABNORMAL LOW (ref >60)
GFR calc non Af Amer: 22 mL/min — ABNORMAL LOW (ref >60)
Glucose, Bld: 154 mg/dL — ABNORMAL HIGH (ref 70–99)
Potassium: 3.6 mmol/L (ref 3.5–5.1)
Sodium: 133 mmol/L — ABNORMAL LOW (ref 135–145)

## 2020-04-01 LAB — GLUCOSE, CAPILLARY
Glucose-Capillary: 129 mg/dL — ABNORMAL HIGH (ref 70–99)
Glucose-Capillary: 120 mg/dL — ABNORMAL HIGH (ref 70–99)
Glucose-Capillary: 95 mg/dL (ref 70–99)
Glucose-Capillary: 91 mg/dL (ref 70–99)

## 2020-04-01 LAB — ALBUMIN: Albumin: 2.3 g/dL — ABNORMAL LOW (ref 3.5–5.0)

## 2020-04-01 MED ORDER — CALCIUM CARBONATE ANTACID 500 MG PO CHEW
1.0000 | CHEWABLE_TABLET | Freq: Three times a day (TID) | ORAL | Status: DC
  Administered 2020-04-01 – 2020-04-06 (×14): 200 mg via ORAL
  Filled 2020-04-01 (×16): qty 1

## 2020-04-01 NOTE — Progress Notes (Signed)
Progress Note  Patient Name: [name] Date of Encounter: 04/01/2020   Subjective   Sitting up in bed, feeling better.  Less short of breath.  Pain is better controlled currently.  Inpatient Medications    Scheduled Meds: . amiodarone  200 mg Oral Daily  . apixaban  5 mg Oral BID  . exemestane  25 mg Oral QPC breakfast  . furosemide  80 mg Intravenous BID  . insulin aspart  0-15 Units Subcutaneous TID AC & HS  . lactose free nutrition  237 mL Oral BID BM  . midodrine  5 mg Oral BID WC  . mirtazapine  7.5 mg Oral QHS  . morphine  15 mg Oral Q12H  . multivitamin with minerals  1 tablet Oral Daily  . OLANZapine  5 mg Oral QHS  . polyethylene glycol  17 g Oral BID  . senna  2 tablet Oral BID   Continuous Infusions: . milrinone 0.125 mcg/kg/min (03/31/20 1600)  . ondansetron (ZOFRAN) IV     PRN Meds: acetaminophen **OR** acetaminophen, HYDROmorphone (DILAUDID) injection, hydrOXYzine, LORazepam, morphine, ondansetron **OR** ondansetron (ZOFRAN) IV, sodium chloride   Vital Signs    Vitals:   03/31/20 2346 04/01/20 0425 04/01/20 0621 04/01/20 0834  BP: (!) 99/57 105/67  122/63  Pulse: 88 94  92  Resp:  15 (!) 25 (!) 24  Temp:  (!) 97.4 F (36.3 C)  (!) 97.3 F (36.3 C)  TempSrc:  Oral  Axillary  SpO2:  95%  96%  Weight:   75.7 kg   Height:        Intake/Output Summary (Last 24 hours) at 04/01/2020 1226 Last data filed at 04/01/2020 1058 Gross per 24 hour  Intake 683 ml  Output --  Net 683 ml   Last 3 Weights 04/01/2020 03/31/2020 04/02/2020  Weight (lbs) 166 lb 14.2 oz 165 lb 9.1 oz 165 lb 3.2 oz  Weight (kg) 75.7 kg 75.1 kg 74.934 kg  Some encounter information is confidential and restricted. Go to Review Flowsheets activity to see all data.     Physical Exam   GEN: No acute distress.   Neck: No JVD Cardiac: RRR, no murmurs, rubs, or gallops.  Respiratory:  Minimal crackles at bases bilaterally. GI: Soft, nontender, non-distended  MS: No edema; No  deformity. Neuro:  Nonfocal  Psych: Normal affect   Labs    High Sensitivity Troponin:   Recent Labs  Lab 03/26/2020 1905 03/31/2020 2206  TROPONINIHS 22* 24*      Chemistry Recent Labs  Lab 03/26/20 0225 03/26/20 0730 03/27/20 0412 03/27/20 0815 03/28/20 0310 03/29/20 0158 04/03/2020 1610 03/19/2020 0658 03/09/2020 1427 03/31/20 0708 04/01/20 0242 04/01/20 0813  NA 136  --  135  --  132*   < > 131*   < > 135  139 135 133*  --   K 5.1   < > 4.1  --  4.3   < > 4.3   < > 4.2  3.7 4.4 3.6  --   CL 102  --  98  --  97*   < > 95*  --   --  97* 99  --   CO2 22  --  25  --  24   < > 24  --   --  24 24  --   GLUCOSE 79  --  91  --  99   < > 95  --   --  96 154*  --  BUN 48*  --  44*  --  42*   < > 45*  --   --  52* 53*  --   CREATININE 1.44*  --  1.39*  --  1.62*   < > 1.94*  --   --  2.18* 2.18*  --   CALCIUM 6.5*  --  6.4*   < > 6.8*   < > 7.0*  --   --  6.8* 6.5*  --   PROT 6.1*  --  5.5*  --  5.4*  --   --   --   --   --   --   --   ALBUMIN 2.6*  --  2.3*  --  2.5*  --   --   --   --   --   --  2.3*  AST 27  --  21  --  23  --   --   --   --   --   --   --   ALT 23  --  19  --  21  --   --   --   --   --   --   --   ALKPHOS 98  --  88  --  94  --   --   --   --   --   --   --   BILITOT 1.0  --  1.1  --  1.1  --   --   --   --   --   --   --   GFRNONAA 36*  --  38*  --  31*   < > 25*  --   --  22* 22*  --   GFRAA 42*  --  44*  --  36*   < > 29*  --   --  25* 25*  --   ANIONGAP 12  --  12  --  11   < > 12  --   --  14 10  --    < > = values in this interval not displayed.     Hematology Recent Labs  Lab 03/27/20 0412 03/27/20 0412 03/28/20 0310 03/29/20 0158 03/04/2020 1427  WBC 7.9  --  8.9 8.0  --   RBC 3.53*  --  3.69* 3.71*  --   HGB 9.6*   < > 9.9* 9.9* 11.9*  11.2*  HCT 31.5*   < > 32.8* 33.3* 35.0*  33.0*  MCV 89.2  --  88.9 89.8  --   MCH 27.2  --  26.8 26.7  --   MCHC 30.5  --  30.2 29.7*  --   RDW 22.7*  --  22.5* 22.6*  --   PLT 276  --  326 296  --     < > = values in this interval not displayed.    BNPNo results for input(s): BNP, PROBNP in the last 168 hours.   DDimer No results for input(s): DDIMER in the last 168 hours.   Radiology    CARDIAC CATHETERIZATION  Result Date: 04/02/2020 Findings: RA = 17 (v = 23) RV = 49/12 PA = 49/24 (35) PCW = 20 (v = 28) Fick cardiac output/index = 3.8/2.1 Thermo CO/CI = 3.7/2.1 PVR = 4.0 WU FA sat = 98% PA sat = 52%, 51% PaPI = 1.5 Assessment: 1. 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 Watch for increased AF rate. Prognosis concerning given hemodynamics. Glori Bickers, MD 3:15 PM   Cardiac Studies    ECHO 12/06/19  1. Left ventricular ejection fraction, by estimation, is 35 to 40%. The  left ventricle has moderately decreased function. The left ventricle  demonstrates regional wall motion abnormalities (see scoring  diagram/findings for description). There is severe  asymmetric left ventricular hypertrophy. Left ventricular diastolic  function could not be evaluated.  2. Right ventricular systolic function is low normal. The right  ventricular size is normal. There is severely elevated pulmonary artery  systolic pressure.  3. Left atrial size was severely dilated.  4. Right atrial size was mildly dilated.  5. The mitral valve is degenerative. Mild mitral valve regurgitation. No  evidence of mitral stenosis.  6. Tricuspid valve regurgitation is moderate.  7. The aortic valve is bicuspid. Aortic valve regurgitation is not  visualized. Mild aortic valve sclerosis is present, with no evidence of  aortic valve stenosis.  8. The inferior vena cava is dilated in size with <50% respiratory  variability, suggesting right atrial pressure of 15 mmHg.   Conclusion(s)/Recommendation(s): EF has decreased since prior echo.   Assessment & Plan    Acute on chronic systolic heart  failure -Advanced heart failure team, Dr. Haroldine Laws, has seen and restarted her IV Lasix as well as milrinone infusion. Continue -Continue to monitor basic metabolic profile -Right heart cath performed. -Yesterday discontinued her Toprol -Seems reasonable to get PICC line to continue infusion for now.  Since she is not a dialysis candidate, this would be a reasonable procedure for her to allow for continued milrinone.  On the other hand, since she is feeling better, Dr. Haroldine Laws may wish to discontinue the milrinone soon.  Perhaps waiting until Monday for his assessment makes sense. -Overall she seems to feel better.  Sitting up in bed, eating fruit.  ICD -Prior V. fib arrest in 2016 ICD placement.  Hypertension -On midodrine 2.5 twice a day, right heart failure contributing.   Paroxysmal atrial relation -Continue with amiodarone 200 a day  Breast cancer with metastasis -Continue palliation is important.         For questions or updates, please contact [place] Please consult [email or web address] for contact info under        Signed, Candee Furbish, MD  04/01/2020, 12:26 PM

## 2020-04-01 NOTE — Progress Notes (Signed)
   04/01/20 1100  Clinical Encounter Type  Visited With Health care provider  Visit Type Follow-up;Spiritual support  Referral From Physician  Consult/Referral To Chaplain   Chaplain called to see if patient is available for visit. Currently pt has her husband and son visiting. Chaplain will visit later in the day. Chaplains remain available for support as needs arise.   Chaplain Resident, Evelene Croon, M Div [phone] on-call chaplain

## 2020-04-01 NOTE — Progress Notes (Signed)
PROGRESS NOTE    [name]  QAS:[id number] DOB: 03/19/1946 DOA: 03/04/2020 PCP: Patient, No Pcp Per   Chief Complaint  Patient presents with  . Chest Pain   Brief Narrative:  [name] is [name] 74 y.o.year old femalewith medical history significant for type 2 diabetes, CAD status post CABG, chronic combined CHF with EF of 35-40% on echo on 12/2019, paroxysmal atrial fibrillation on Eliquis, history of V. fib status post ICD, breast cancer with bone mets/Lynch syndrome who presented on 8/14 with several days of shortness of breath, worsening redness of lower legs and weeping lesions for the past 2 weeks in setting of using Derm stone to scrape her dry skin and found to have [name]. fib with RVR to the 120s and sepsis physiology (white count of 24.5, lactic acid 2.2, Respiratory rate of 21) secondary to bilateral lower extremity cellulitis. course complicated by [name]. fib with RVR, AKI on CKD with hyperkalemia.  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  --Cardiology/heart failure service following, appreciate assistance -- nursing requesting central line for milrinone -> will defer PICC vs tunneled line to cardiology --Right heart cath today shows severe heart failure right more than left with low output and volume overload --Continue Unna boots --Continue monitor strict I's and O's and daily  weights --Started on IV Lasix and milrinone infusion.  Sepsis secondary to nonpurulent cellulitis of bilateral lower extremities, resolved.  -Presented to the ER with tachycardia, tachypnea, leukocytosis in the setting of extensive left thigh cellulitis with associated AKI.  Patient was initially started on empiric antibiotics with vancomycin and meropenem which was transitioned to ceftriaxone in which she completed [name] 7-day course ending on 03/25/2020.  [name]. fib with RVR. Likely exacerbated in setting of sepsis due to above, persists.  Heart rate acceptable now on amiodarone and metoprolol. On Eliquis  Hypotension - SBP 119 today --continue midodrine 2.5 mg BID per Cardiology recommendations --toprol d/c'd per cards  Breast cancer with bone metastasis: --Followed by Dr. Marin Olp.  Patient is on pain medications with MS Contin, short-acting morphine and as needed Dilaudid. --Patient is currently on immunotherapy. --Prognosis remains poor given metastatic breast cancer along with severe cardiovascular morbidities. --Appreciate palliative care involvement. --Home with home palliative care.  History of VT/V. fib, status post ICD (2016), history of V. fib arrest in 11/2016 -Has not been able to tolerate amiodarone or beta blockade in the past -Currently on amiodarone 200 mg daily --Continue to monitor on telemetry  Type 2 diabetes Hemoglobin A1c A1c 7.2.  -Holding home glipizide -Monitor CBG, sliding scale as needed --Nutrition consulted to assist with diet supplementation  Acute on CKD with hyperkalemia  Peak creatinine of 2.2, baseline of ~1.6. Required lokelma on 8/20 for recurrent hyperkalemia, now improved at 4.3. --creatinine 2.18 today --Avoid NSAIDs and Toradol. --Monitor BMP daily --Continue to monitor urinary output closely  Hypocalcemia: replace oral, follow  DVT prophylaxis: eliquis Code Status: DNR  Family Communication: none at bedside Disposition:   Status  is: Inpatient  Remains inpatient appropriate because:Inpatient level of care appropriate due to severity of illness   Dispo:  Patient From: Home  Planned Disposition: To be determined  Expected discharge date: 04/02/20  Medically stable for discharge: No   Consultants:   Cardiology  oncology  Procedures:  RHC Assessment: 1. 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 Watch for increased AF rate.   Prognosis concerning given hemodynamics.   LE [place] Summary:  RIGHT:  - There is no evidence of deep vein thrombosis in the lower extremity.  However, portions of this examination were limited- see technologist  comments above.    - No cystic structure found in the popliteal fossa.  - calf veins not visulaized due to bandages    LEFT:  - There is no evidence of deep vein thrombosis in the lower extremity.  However, portions of this examination were limited- see technologist  comments above.    - [name] cystic structure is found in the popliteal fossa.  - calf veins not visulaized due to bandages     Antimicrobials: Anti-infectives (From admission, onward)   Start     Dose/Rate Route Frequency Ordered Stop   03/20/20 1430  cefTRIAXone (ROCEPHIN) 2 g in sodium chloride 0.9 % 100 mL IVPB        2 g 200 mL/hr over 30 Minutes Intravenous Every 24 hours 03/20/20 1345 03/25/20 1320   03/20/20 0600  vancomycin (VANCOREADY) IVPB 1250 mg/250 mL  Status:  Discontinued        1,250 mg 166.7 mL/hr over 90 Minutes Intravenous Every 48 hours 03/18/20 0230 03/20/20 1345   03/18/20 0330  vancomycin (VANCOREADY) IVPB 1500 mg/300 mL        1,500 mg 150 mL/hr over 120 Minutes Intravenous  Once 03/18/20 0230 03/18/20 0803   03/18/20 0330  meropenem (MERREM) 1 g in sodium chloride 0.9 % 100 mL IVPB  Status:  Discontinued        1 g 200 mL/hr over 30 Minutes Intravenous 2  times daily 03/18/20 0230 03/20/20 1345   03/18/20 0130  piperacillin-tazobactam (ZOSYN) IVPB 3.375 g  Status:  Discontinued        3.375 g 100 mL/hr over 30 Minutes Intravenous  Once 03/18/20 0115 03/18/20 0214     Subjective: C/o chronic pain L side  Objective: Vitals:   04/01/20 0621 04/01/20 0834 04/01/20 1208 04/01/20 1712  BP:  122/63 114/65 119/67  Pulse:  92 96 100  Resp: (!) 25 (!) 24 19 16   Temp:  (!) 97.3 F (36.3 C) (!) 96.5 F (35.8 C) (!) 97.5 F (36.4 C)  TempSrc:  Axillary Axillary Oral  SpO2:  96% 98% 97%  Weight: 75.7 kg     Height:        Intake/Output Summary (Last 24 hours) at 04/01/2020 1721 Last data filed at 04/01/2020 1058 Gross per 24 hour  Intake 620.56 ml  Output --  Net 620.56 ml   Filed Weights   03/29/2020 0451 03/31/20 0500 04/01/20 0621  Weight: 74.9 kg 75.1 kg 75.7 kg    Examination:  General exam: Appears calm and comfortable  Respiratory system: unlabored Cardiovascular system: RRR Gastrointestinal system: Abdomen is nondistended, soft and nontender Central nervous system: Alert and oriented. No focal neurological deficits. Extremities: bilateral unna boots Skin: No rashes, lesions or  ulcers Psychiatry: Judgement and insight appear normal. Mood & affect appropriate.     Data Reviewed: I have personally reviewed following labs and imaging studies  CBC: Recent Labs  Lab 03/26/20 0225 03/27/20 0412 03/28/20 0310 03/29/20 0158 03/16/2020 1427  WBC 9.4 7.9 8.9 8.0  --   NEUTROABS 7.9* 6.2 6.8  --   --   HGB 10.9* 9.6* 9.9* 9.9* 11.9*  11.2*  HCT 36.5 31.5* 32.8* 33.3* 35.0*  33.0*  MCV 89.5 89.2 88.9 89.8  --   PLT 243 276 326 296  --     Basic Metabolic Panel: Recent Labs  Lab 03/27/20 0412 03/27/20 0815 03/28/20 0310 03/28/20 0310 03/29/20 0158 03/26/2020 0658 03/22/2020 1427 03/31/20 0708 04/01/20 0242  NA 135  --  132*   < > 133* 131* 135  139 135 133*  K 4.1  --  4.3   < > 4.1 4.3 4.2  3.7 4.4 3.6  CL 98   --  97*  --  96* 95*  --  97* 99  CO2 25  --  24  --  25 24  --  24 24  GLUCOSE 91  --  99  --  68* 95  --  96 154*  BUN 44*  --  42*  --  42* 45*  --  52* 53*  CREATININE 1.39*  --  1.62*  --  1.82* 1.94*  --  2.18* 2.18*  CALCIUM 6.4*   < > 6.8*  --  6.8* 7.0*  --  6.8* 6.5*  MG 2.1  --   --   --  2.1  --   --   --   --    < > = values in this interval not displayed.    GFR: Estimated Creatinine Clearance: 23.2 mL/min (Cattie Tineo) (by C-G formula based on SCr of 2.18 mg/dL (H)).  Liver Function Tests: Recent Labs  Lab 03/26/20 0225 03/27/20 0412 03/28/20 0310 04/01/20 0813  AST 27 21 23   --   ALT 23 19 21   --   ALKPHOS 98 88 94  --   BILITOT 1.0 1.1 1.1  --   PROT 6.1* 5.5* 5.4*  --   ALBUMIN 2.6* 2.3* 2.5* 2.3*    CBG: Recent Labs  Lab 03/31/20 1142 03/31/20 1645 03/31/20 2101 04/01/20 0614 04/01/20 1206  GLUCAP 177* 158* 148* 129* 120*     No results found for this or any previous visit (from the past 240 hour(s)).       Radiology Studies: [place] EKG SITE RITE  Result Date: 04/01/2020 If Site Rite image not attached, placement could not be confirmed due to current cardiac rhythm.       Scheduled Meds: . amiodarone  200 mg Oral Daily  . apixaban  5 mg Oral BID  . exemestane  25 mg Oral QPC breakfast  . furosemide  80 mg Intravenous BID  . insulin aspart  0-15 Units Subcutaneous TID AC & HS  . lactose free nutrition  237 mL Oral BID BM  . midodrine  5 mg Oral BID WC  . mirtazapine  7.5 mg Oral QHS  . morphine  15 mg Oral Q12H  . multivitamin with minerals  1 tablet Oral Daily  . OLANZapine  5 mg Oral QHS  . polyethylene glycol  17 g Oral BID  . senna  2 tablet Oral BID   Continuous Infusions: . milrinone 0.125 mcg/kg/min (03/31/20 1600)  . ondansetron (ZOFRAN) IV  LOS: 14 days    Time spent: over 30 min    Fayrene Helper, MD [facility]   To contact the attending provider between 7A-7P or the covering provider during after hours  7P-7A, please log into the web site [email or web address] and access using universal [place] password for that web site. If you do not have the password, please call the hospital operator.  04/01/2020, 5:21 PM

## 2020-04-02 ENCOUNTER — Encounter (HOSPITAL_COMMUNITY): Payer: Self-pay | Admitting: Internal Medicine

## 2020-04-02 ENCOUNTER — Inpatient Hospital Stay (HOSPITAL_COMMUNITY): Payer: PPO

## 2020-04-02 DIAGNOSIS — I5043 Acute on chronic combined systolic (congestive) and diastolic (congestive) heart failure: Secondary | ICD-10-CM

## 2020-04-02 LAB — CBC WITH DIFFERENTIAL/PLATELET
Abs Immature Granulocytes: 0.05 10*3/uL (ref 0.00–0.07)
Basophils Absolute: 0.1 10*3/uL (ref 0.0–0.1)
Basophils Relative: 1 %
Eosinophils Absolute: 0.6 10*3/uL — ABNORMAL HIGH (ref 0.0–0.5)
Eosinophils Relative: 6 %
HCT: 30.8 % — ABNORMAL LOW (ref 36.0–46.0)
Hemoglobin: 9.4 g/dL — ABNORMAL LOW (ref 12.0–15.0)
Immature Granulocytes: 1 %
Lymphocytes Relative: 4 %
Lymphs Abs: 0.4 10*3/uL — ABNORMAL LOW (ref 0.7–4.0)
MCH: 27.4 pg (ref 26.0–34.0)
MCHC: 30.5 g/dL (ref 30.0–36.0)
MCV: 89.8 fL (ref 80.0–100.0)
Monocytes Absolute: 0.7 10*3/uL (ref 0.1–1.0)
Monocytes Relative: 9 %
Neutro Abs: 6.8 10*3/uL (ref 1.7–7.7)
Neutrophils Relative %: 79 %
Platelets: 279 10*3/uL (ref 150–400)
RBC: 3.43 MIL/uL — ABNORMAL LOW (ref 3.87–5.11)
RDW: 22.8 % — ABNORMAL HIGH (ref 11.5–15.5)
WBC: 8.5 10*3/uL (ref 4.0–10.5)
nRBC: 0 % (ref 0.0–0.2)

## 2020-04-02 LAB — ECHOCARDIOGRAM LIMITED
Height: 64 in
S' Lateral: 3.7 cm
Weight: 2654.4 oz

## 2020-04-02 LAB — COMPREHENSIVE METABOLIC PANEL
ALT: 21 U/L (ref 0–44)
AST: 20 U/L (ref 15–41)
Albumin: 2.4 g/dL — ABNORMAL LOW (ref 3.5–5.0)
Alkaline Phosphatase: 106 U/L (ref 38–126)
Anion gap: 13 (ref 5–15)
BUN: 56 mg/dL — ABNORMAL HIGH (ref 8–23)
CO2: 23 mmol/L (ref 22–32)
Calcium: 6.7 mg/dL — ABNORMAL LOW (ref 8.9–10.3)
Chloride: 98 mmol/L (ref 98–111)
Creatinine, Ser: 2.07 mg/dL — ABNORMAL HIGH (ref 0.44–1.00)
GFR calc Af Amer: 27 mL/min — ABNORMAL LOW (ref >60)
GFR calc non Af Amer: 23 mL/min — ABNORMAL LOW (ref >60)
Glucose, Bld: 109 mg/dL — ABNORMAL HIGH (ref 70–99)
Potassium: 3.7 mmol/L (ref 3.5–5.1)
Sodium: 134 mmol/L — ABNORMAL LOW (ref 135–145)
Total Bilirubin: 1.4 mg/dL — ABNORMAL HIGH (ref 0.3–1.2)
Total Protein: 5.5 g/dL — ABNORMAL LOW (ref 6.5–8.1)

## 2020-04-02 LAB — GLUCOSE, CAPILLARY
Glucose-Capillary: 91 mg/dL (ref 70–99)
Glucose-Capillary: 122 mg/dL — ABNORMAL HIGH (ref 70–99)
Glucose-Capillary: 92 mg/dL (ref 70–99)
Glucose-Capillary: 88 mg/dL (ref 70–99)

## 2020-04-02 LAB — PHOSPHORUS: Phosphorus: 4.5 mg/dL (ref 2.5–4.6)

## 2020-04-02 LAB — MAGNESIUM: Magnesium: 2.1 mg/dL (ref 1.7–2.4)

## 2020-04-02 MED ORDER — METOPROLOL SUCCINATE ER 25 MG PO TB24
25.0000 mg | ORAL_TABLET | Freq: Every day | ORAL | Status: DC
  Administered 2020-04-02: 25 mg via ORAL
  Filled 2020-04-02 (×4): qty 1

## 2020-04-02 MED ORDER — METOLAZONE 5 MG PO TABS
2.5000 mg | ORAL_TABLET | Freq: Once | ORAL | Status: AC
  Administered 2020-04-02: 2.5 mg via ORAL
  Filled 2020-04-02: qty 1

## 2020-04-02 MED ORDER — POTASSIUM CHLORIDE CRYS ER 20 MEQ PO TBCR
40.0000 meq | EXTENDED_RELEASE_TABLET | Freq: Once | ORAL | Status: AC
  Administered 2020-04-02: 40 meq via ORAL
  Filled 2020-04-02: qty 2

## 2020-04-02 MED FILL — Lidocaine HCl Local Soln Prefilled Syringe 100 MG/5ML (2%): INTRAMUSCULAR | Qty: 5 | Status: AC

## 2020-04-02 NOTE — Progress Notes (Signed)
PROGRESS NOTE    [name]  AQT:[id number] DOB: 05-Jun-1946 DOA: 03/08/2020 PCP: Patient, No Pcp Per   Chief Complaint  Patient presents with  . 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 fib status post ICD, breast cancer with bone mets/Lynch syndrome who presented on 8/14 with several days of shortness of breath, worsening redness of lower legs and weeping lesions for the past 2 weeks in setting of using Derm stone to scrape her dry skin and found to have [name]. fib with RVR to the 120s and sepsis physiology (white count of 24.5, lactic acid 2.2, Respiratory rate of 21) secondary to bilateral lower extremity cellulitis. course complicated by Rohail Klees. fib with RVR, AKI on CKD with hyperkalemia.  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  --Cardiology/heart failure service following, appreciate assistance -- nursing requesting central line for milrinone -> will defer PICC vs tunneled line to cardiology --Right heart cath today shows severe heart failure right more than left with low output and volume overload --Continue Unna boots --Continue monitor strict I's and O's and daily  weights --Started on IV Lasix and milrinone infusion.  Metolazone per cards. -- repeat echo with EF 35-40%, global hypokinesis (see report)  Sepsis secondary to nonpurulent cellulitis of bilateral lower extremities, resolved.  -Presented to the ER with tachycardia, tachypnea, leukocytosis in the setting of extensive left thigh cellulitis with associated AKI.  Patient was initially started on empiric antibiotics with vancomycin and meropenem which was transitioned to ceftriaxone in which she completed Kista Robb 7-day course ending on 03/25/2020.  [name]. fib with RVR. Continue amiodarone Restart toprol Eliquis Per HF team, consider DCCV if EF falling on echo and not responding to milrinone  Hypotension - SBP 100 --continue midodrine 2.5 mg BID per Cardiology recommendations --toprol d/c'd per cards  Breast cancer with bone metastasis: --Followed by Dr. Marin Olp.  Patient is on pain medications with MS Contin, short-acting morphine and as needed Dilaudid. --Patient is currently on immunotherapy. --Prognosis remains poor given metastatic breast cancer along with severe cardiovascular morbidities. --Appreciate palliative care involvement. --Home with home palliative care.  History of VT/V. fib, status post ICD (2016), history of V. fib arrest in 11/2016 -Has not been able to tolerate amiodarone or beta blockade in the past -Currently on amiodarone 200 mg daily --Continue to monitor on telemetry  Type 2 diabetes Hemoglobin A1c A1c 7.2.  -Holding home glipizide -Monitor CBG, sliding scale as needed --Nutrition consulted to assist with diet supplementation  Acute on CKD with hyperkalemia  Peak creatinine of 2.2, baseline of ~1.6. Required lokelma on 8/20 for recurrent hyperkalemia, now improved at 4.3. --creatinine 2.07 today --Avoid NSAIDs and Toradol. --Monitor BMP daily --Continue to monitor urinary output closely  Hypocalcemia: replace oral, follow  DVT prophylaxis: eliquis Code  Status: DNR Family Communication: none at bedside Disposition:   Status is: Inpatient  Remains inpatient appropriate because:Inpatient level of care appropriate due to severity of illness   Dispo:  Patient From: Home  Planned Disposition: To be determined  Expected discharge date: 04/02/20  Medically stable for discharge: No   Consultants:   Cardiology  oncology  Procedures:  Echo IMPRESSIONS    1. Left ventricular ejection fraction, by estimation, is 35 to 40%. The  left ventricle has moderately decreased function. The left ventricle  demonstrates global hypokinesis with basal to mid inferior akinesis. There  is moderate asymmetric left  ventricular hypertrophy of the basal-septal segment. Left ventricular  diastolic function could not be evaluated.  2. Left atrial size was severely dilated.  3. The mitral valve is abnormal. Mild mitral valve regurgitation.  4. Tricuspid valve regurgitation is moderate.  5. The aortic valve is tricuspid (previously reported as bicuspid, but  clearly visualized in this study as tricuspid). Aortic valve regurgitation  is not visualized.  6. RV systolic function not quantitated, but qualitatively appears  depressed. Right ventricular systolic function is mildly reduced. [name] AICD  wire noted is visualized. There is moderately elevated pulmonary artery  systolic pressure. The estimated right  ventricular systolic pressure is 50.5 mmHg.  7. The inferior vena cava is dilated in size with <50% respiratory  variability, suggesting right atrial pressure of 15 mmHg.  8. Right atrial size was moderately dilated.   RHC Assessment: 1. 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 Watch for increased AF rate.   Prognosis concerning given hemodynamics.   LE [place] Summary:  RIGHT:  - There is no evidence of deep vein  thrombosis in the lower extremity.  However, portions of this examination were limited- see technologist  comments above.    - No cystic structure found in the popliteal fossa.  - calf veins not visulaized due to bandages    LEFT:  - There is no evidence of deep vein thrombosis in the lower extremity.  However, portions of this examination were limited- see technologist  comments above.    - Rolande Moe cystic structure is found in the popliteal fossa.  - calf veins not visulaized due to bandages     Antimicrobials: Anti-infectives (From admission, onward)   Start     Dose/Rate Route Frequency Ordered Stop   03/20/20 1430  cefTRIAXone (ROCEPHIN) 2 g in sodium chloride 0.9 % 100 mL IVPB        2 g 200 mL/hr over 30 Minutes Intravenous Every 24 hours 03/20/20 1345 03/25/20 1320   03/20/20 0600  vancomycin (VANCOREADY) IVPB 1250 mg/250 mL  Status:  Discontinued        1,250 mg 166.7 mL/hr over 90 Minutes Intravenous Every 48 hours 03/18/20 0230 03/20/20 1345   03/18/20 0330  vancomycin (VANCOREADY) IVPB 1500 mg/300 mL        1,500 mg 150 mL/hr over 120 Minutes Intravenous  Once 03/18/20 0230 03/18/20 0803   03/18/20 0330  meropenem (MERREM) 1 g in sodium chloride 0.9 % 100 mL IVPB  Status:  Discontinued        1 g 200 mL/hr over 30 Minutes Intravenous 2 times daily 03/18/20 0230 03/20/20 1345   03/18/20 0130  piperacillin-tazobactam (ZOSYN) IVPB 3.375 g  Status:  Discontinued  3.375 g 100 mL/hr over 30 Minutes Intravenous  Once 03/18/20 0115 03/18/20 0214     Subjective: C/o pain today  Objective: Vitals:   04/01/20 2305 04/02/20 0535 04/02/20 0818 04/02/20 1640  BP: 109/68 133/69 (!) 119/59 (!) 100/48  Pulse: (!) 106 (!) 118  86  Resp:  20 20 20   Temp: (!) 97.5 F (36.4 C) 97.8 F (36.6 C) 97.9 F (36.6 C) 97.6 F (36.4 C)  TempSrc: Axillary Oral Oral Axillary  SpO2: 94% 96%  97%  Weight:  75.3 kg    Height:        Intake/Output Summary (Last 24 hours) at  04/02/2020 1925 Last data filed at 04/02/2020 0548 Gross per 24 hour  Intake --  Output 600 ml  Net -600 ml   Filed Weights   03/31/20 0500 04/01/20 0621 04/02/20 0535  Weight: 75.1 kg 75.7 kg 75.3 kg    Examination:  General: No acute distress. Cardiovascular: irregularly irreg Lungs: Clear to auscultation bilaterally Abdomen: Soft, nontender, nondistended  Neurological: Alert and oriented 3. Moves all extremities 4. Cranial nerves II through XII grossly intact. Skin: Warm and dry. No rashes or lesions. Extremities: bilateral LE edema   Data Reviewed: I have personally reviewed following labs and imaging studies  CBC: Recent Labs  Lab 03/27/20 0412 03/28/20 0310 03/29/20 0158 03/07/2020 1427 04/02/20 0153  WBC 7.9 8.9 8.0  --  8.5  NEUTROABS 6.2 6.8  --   --  6.8  HGB 9.6* 9.9* 9.9* 11.9*  11.2* 9.4*  HCT 31.5* 32.8* 33.3* 35.0*  33.0* 30.8*  MCV 89.2 88.9 89.8  --  89.8  PLT 276 326 296  --  856    Basic Metabolic Panel: Recent Labs  Lab 03/27/20 0412 03/27/20 0815 03/29/20 0158 03/29/20 0158 03/29/2020 0658 03/21/2020 1427 03/31/20 0708 04/01/20 0242 04/02/20 0153  NA 135   < > 133*   < > 131* 135  139 135 133* 134*  K 4.1   < > 4.1   < > 4.3 4.2  3.7 4.4 3.6 3.7  CL 98   < > 96*  --  95*  --  97* 99 98  CO2 25   < > 25  --  24  --  24 24 23   GLUCOSE 91   < > 68*  --  95  --  96 154* 109*  BUN 44*   < > 42*  --  45*  --  52* 53* 56*  CREATININE 1.39*   < > 1.82*  --  1.94*  --  2.18* 2.18* 2.07*  CALCIUM 6.4*   < > 6.8*  --  7.0*  --  6.8* 6.5* 6.7*  MG 2.1  --  2.1  --   --   --   --   --  2.1  PHOS  --   --   --   --   --   --   --   --  4.5   < > = values in this interval not displayed.    GFR: Estimated Creatinine Clearance: 24.4 mL/min (Kinsley Holderman) (by C-G formula based on SCr of 2.07 mg/dL (H)).  Liver Function Tests: Recent Labs  Lab 03/27/20 0412 03/28/20 0310 04/01/20 0813 04/02/20 0153  AST 21 23  --  20  ALT 19 21  --  21  ALKPHOS 88 94   --  106  BILITOT 1.1 1.1  --  1.4*  PROT 5.5* 5.4*  --  5.5*  ALBUMIN 2.3* 2.5* 2.3* 2.4*    CBG: Recent Labs  Lab 04/01/20 1722 04/01/20 2137 04/02/20 0602 04/02/20 1213 04/02/20 1743  GLUCAP 95 91 91 122* 92     No results found for this or any previous visit (from the past 240 hour(s)).       Radiology Studies: ECHOCARDIOGRAM LIMITED  Result Date: 04/02/2020    ECHOCARDIOGRAM LIMITED REPORT   Patient Name:   VERLEEN STUCKEY Date of Exam: 04/02/2020 Medical Rec #:  [id number]        Height:       64.0 in Accession #:    [id number]       Weight:       165.9 lb Date of Birth:  [birth year]        BSA:          1.807 m Patient Age:    20 years         BP:           119/59 mmHg Patient Gender: F                HR:           113 bpm. Exam Location:  Inpatient Procedure: Limited Echo, Limited Color Doppler and Color Doppler Indications:    Congestive Heart Failure I50.21  History:        Patient has prior history of Echocardiogram examinations, most                 recent 12/09/2019. Hypertrophic Cardiomyopathy, CAD and Previous                 Myocardial Infarction, Prior CABG, Arrythmias:Atrial                 Fibrillation; Risk Factors:Diabetes.  Sonographer:    Mikki Santee RDCS (AE) Referring Phys: [id number] DANIEL R BENSIMHON IMPRESSIONS  1. Left ventricular ejection fraction, by estimation, is 35 to 40%. The left ventricle has moderately decreased function. The left ventricle demonstrates global hypokinesis with basal to mid inferior akinesis. There is moderate asymmetric left ventricular hypertrophy of the basal-septal segment. Left ventricular diastolic function could not be evaluated.  2. Left atrial size was severely dilated.  3. The mitral valve is abnormal. Mild mitral valve regurgitation.  4. Tricuspid valve regurgitation is moderate.  5. The aortic valve is tricuspid (previously reported as bicuspid, but clearly visualized in this study as tricuspid). Aortic valve regurgitation is  not visualized.  6. RV systolic function not quantitated, but qualitatively appears depressed. Right ventricular systolic function is mildly reduced. Mamie Diiorio AICD wire noted is visualized. There is moderately elevated pulmonary artery systolic pressure. The estimated right ventricular systolic pressure is 37.1 mmHg.  7. The inferior vena cava is dilated in size with <50% respiratory variability, suggesting right atrial pressure of 15 mmHg.  8. Right atrial size was moderately dilated. Comparison(s): Changes from prior study are noted. 12/09/2019: LVEF 35-40%, RVSP 71 mmHg, low normal RV systolic function. FINDINGS  Left Ventricle: Left ventricular ejection fraction, by estimation, is 35 to 40%. The left ventricle has moderately decreased function. The left ventricle demonstrates global hypokinesis. There is moderate asymmetric left ventricular hypertrophy of the basal-septal segment. Left ventricular diastolic function could not be evaluated due to atrial fibrillation. Right Ventricle: RV systolic function not quantitated, but qualitatively appears depressed. Right ventricular systolic function is mildly reduced. There is moderately elevated pulmonary artery systolic pressure. The tricuspid regurgitant velocity is 3.26  m/s, and  with an assumed right atrial pressure of 15 mmHg, the estimated right ventricular systolic pressure is 84.6 mmHg. Left Atrium: Left atrial size was severely dilated. Right Atrium: Right atrial size was moderately dilated. Pericardium: There is no evidence of pericardial effusion. Mitral Valve: The mitral valve is abnormal. There is moderate calcification of the posterior and anterior mitral valve leaflet(s). Moderate mitral annular calcification. Mild mitral valve regurgitation. Tricuspid Valve: The tricuspid valve is grossly normal. Tricuspid valve regurgitation is moderate. Aortic Valve: The aortic valve is tricuspid. Aortic valve regurgitation is not visualized. Pulmonic Valve: The pulmonic valve  was grossly normal. Pulmonic valve regurgitation is mild. Aorta: The aortic root and ascending aorta are structurally normal, with no evidence of dilitation. Venous: The inferior vena cava is dilated in size with less than 50% respiratory variability, suggesting right atrial pressure of 15 mmHg. IAS/Shunts: No atrial level shunt detected by color flow Doppler. Additional Comments: Keeara Frees AICD wire noted is visualized. LEFT VENTRICLE PLAX 2D LVIDd:         4.50 cm LVIDs:         3.70 cm LV PW:         1.40 cm LV IVS:        1.50 cm LVOT diam:     2.20 cm LVOT Area:     3.80 cm  LEFT ATRIUM         Index LA diam:    4.40 cm 2.44 cm/m   AORTA Ao Root diam: 3.50 cm TRICUSPID VALVE TR Peak grad:   42.5 mmHg TR Vmax:        326.00 cm/s  SHUNTS Systemic Diam: 2.20 cm Lyman Bishop MD Electronically signed by Lyman Bishop MD Signature Date/Time: 04/02/2020/12:02:51 PM    Final    [place] EKG SITE RITE  Result Date: 04/01/2020 If Site Rite image not attached, placement could not be confirmed due to current cardiac rhythm.       Scheduled Meds: . amiodarone  200 mg Oral Daily  . apixaban  5 mg Oral BID  . calcium carbonate  1 tablet Oral TID  . exemestane  25 mg Oral QPC breakfast  . furosemide  80 mg Intravenous BID  . insulin aspart  0-15 Units Subcutaneous TID AC & HS  . lactose free nutrition  237 mL Oral BID BM  . metoprolol succinate  25 mg Oral Daily  . midodrine  5 mg Oral BID WC  . mirtazapine  7.5 mg Oral QHS  . morphine  15 mg Oral Q12H  . multivitamin with minerals  1 tablet Oral Daily  . OLANZapine  5 mg Oral QHS  . polyethylene glycol  17 g Oral BID  . senna  2 tablet Oral BID   Continuous Infusions: . milrinone 0.125 mcg/kg/min (04/02/20 1637)  . ondansetron (ZOFRAN) IV       LOS: 15 days    Time spent: over 30 min    Fayrene Helper, MD [facility]   To contact the attending provider between 7A-7P or the covering provider during after hours 7P-7A, please log into the  web site [email or web address] and access using universal  password for that web site. If you do not have the password, please call the hospital operator.  04/02/2020, 7:25 PM

## 2020-04-02 NOTE — Progress Notes (Signed)
  Echocardiogram 2D Echocardiogram has been performed.  [name] 04/02/2020, 11:18 AM

## 2020-04-02 NOTE — Progress Notes (Addendum)
Advanced Heart Failure Rounding Note  PCP-Cardiologist: No primary care provider on file.    Patient Profile   74 y/o woman with recently diagnosed metastatic breast CA (undergoing XRT), CAD s/p CABG, DM2, systolic HF with EF 26-37% and PAF.   Admitted with sepsis and lactic acidosis due to severe LE cellulitis in setting of marked volume overload. Course complicated by AF with RVR.  [place] 8/27 RHC Findings:  RA = 17 (v = 23) RV = 49/12 PA = 49/24 (35) PCW = 20 (v = 28) Fick cardiac output/index = 3.8/2.1 Thermo CO/CI = 3.7/2.1 PVR = 4.0 WU FA sat = 98%  PA sat = 52%, 51% PaPI = 1.5  Assessment: 1. 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  No PICC line, thus unable to check co-ox and CVP.   Strict I/Os not charted yesterday. Has had issues w/ incontinence. However she reports good urinary response to IV Lasix. LEE noticeably improved but remains edematous. SCr remains elevated but stable w/ diuresis.   Continues in persistent atrial fibrillation, V-rates low 100s-110s. BP stable.   Her main complaint this morning in back/ rib pain.   Objective:   Weight Range: 75.3 kg Body mass index is 28.48 kg/m.   Vital Signs:   Temp:  [96.5 F (35.8 C)-97.8 F (36.6 C)] 97.8 F (36.6 C) (08/30 0535) Pulse Rate:  [92-118] 118 (08/30 0535) Resp:  [16-24] 20 (08/30 0535) BP: (109-133)/(61-69) 133/69 (08/30 0535) SpO2:  [94 %-98 %] 96 % (08/30 0535) Weight:  [75.3 kg] 75.3 kg (08/30 0535) Last BM Date: 04/01/20  Weight change: Filed Weights   03/31/20 0500 04/01/20 0621 04/02/20 0535  Weight: 75.1 kg 75.7 kg 75.3 kg    Intake/Output:   Intake/Output Summary (Last 24 hours) at 04/02/2020 0748 Last data filed at 04/02/2020 0548 Gross per 24 hour  Intake 600 ml  Output 600 ml  Net 0 ml      Physical Exam   General:  chronically ill appearing. Sitting up on side of bed. No resp  difficulty HEENT: normal Neck: supple. JVP elevated to jaw. Carotids 2+ bilat; no bruits. No lymphadenopathy or thryomegaly appreciated. Cor: PMI nondisplaced. Irregularly irregular rhythm,  Mildly tachy rate. No rubs, gallops or murmurs. Lungs: clear Abdomen: soft, nontender, nondistended. No hepatosplenomegaly. No bruits or masses. Good bowel sounds. Extremities: no cyanosis, clubbing, rash, + UNNA boots,  1+ bilateral LEE up to knees Neuro: alert & orientedx3, cranial nerves grossly intact. moves all 4 extremities w/o difficulty. Affect pleasant    Telemetry   AF low 100s-110s Personally reviewed   Labs    CBC Recent Labs    03/28/2020 1427 04/02/20 0153  WBC  --  8.5  NEUTROABS  --  6.8  HGB 11.9*  11.2* 9.4*  HCT 35.0*  33.0* 30.8*  MCV  --  89.8  PLT  --  858   Basic Metabolic Panel Recent Labs    04/01/20 0242 04/02/20 0153  NA 133* 134*  K 3.6 3.7  CL 99 98  CO2 24 23  GLUCOSE 154* 109*  BUN 53* 56*  CREATININE 2.18* 2.07*  CALCIUM 6.5* 6.7*  MG  --  2.1  PHOS  --  4.5   Liver Function Tests Recent Labs    04/01/20 0813 04/02/20 0153  AST  --  20  ALT  --  21  ALKPHOS  --  106  BILITOT  --  1.4*  PROT  --  5.5*  ALBUMIN 2.3* 2.4*   No results for input(s): LIPASE, AMYLASE in the last 72 hours. Cardiac Enzymes No results for input(s): CKTOTAL, CKMB, CKMBINDEX, TROPONINI in the last 72 hours.  BNP: BNP (last 3 results) Recent Labs    12/04/19 1324 03/18/20 0229  BNP 581.1* 912.0*    ProBNP (last 3 results) No results for input(s): PROBNP in the last 8760 hours.   D-Dimer No results for input(s): DDIMER in the last 72 hours. Hemoglobin A1C No results for input(s): HGBA1C in the last 72 hours. Fasting Lipid Panel No results for input(s): CHOL, HDL, LDLCALC, TRIG, CHOLHDL, LDLDIRECT in the last 72 hours. Thyroid Function Tests No results for input(s): TSH, T4TOTAL, T3FREE, THYROIDAB in the last 72 hours.  Invalid input(s):  FREET3  Other results:   Imaging    [place] EKG SITE RITE  Result Date: 04/01/2020 If Site Rite image not attached, placement could not be confirmed due to current cardiac rhythm.    Medications:     Scheduled Medications: . amiodarone  200 mg Oral Daily  . apixaban  5 mg Oral BID  . calcium carbonate  1 tablet Oral TID  . exemestane  25 mg Oral QPC breakfast  . furosemide  80 mg Intravenous BID  . insulin aspart  0-15 Units Subcutaneous TID AC & HS  . lactose free nutrition  237 mL Oral BID BM  . midodrine  5 mg Oral BID WC  . mirtazapine  7.5 mg Oral QHS  . morphine  15 mg Oral Q12H  . multivitamin with minerals  1 tablet Oral Daily  . OLANZapine  5 mg Oral QHS  . polyethylene glycol  17 g Oral BID  . senna  2 tablet Oral BID    Infusions: . milrinone 0.125 mcg/kg/min (03/31/20 1600)  . ondansetron (ZOFRAN) IV      PRN Medications: acetaminophen **OR** acetaminophen, HYDROmorphone (DILAUDID) injection, hydrOXYzine, LORazepam, morphine, ondansetron **OR** ondansetron (ZOFRAN) IV, sodium chloride   Assessment/Plan    1. Atrial Fibrillation with RVR - S/p MAZE and LAA clipping at time of CABG in 11/2016. S/p DCCV in 09/2015 and more recently 12/09/2019. Noted to be back in atrial fibrillation at last office visit on 01/30/2020 and started on low dose PO Amiodarone. - Presented in atrial fibrillation with RVR in the setting of sepsis secondary to cellulitis of bilateral lower extremities. Difficult situation complicated by patient's intolerance to multiple cardiac meds.  - Not been able to tolerate IV Amiodarone due to severe nausea. - Not candidate for CCB or dig - continue to treat w/ low dose PO amio, 200 mg daily  (refuses higher dose) - continue low dose  blocker, Toprol XL 25 mg daily   - Continue Eliquis 5mg  twice daily.  No bleeding - consider attempt at DCCV  Prior to d/c   2. Acute on Chronic Combined CHF - History of ischemic cardiomyopathy. PYP in the past  negative for amyloid. - Most recent Echo in 12/2019 showed LVEF of 35-40% with severe asymmetric LVH, mild MR, moderate TR, and severely elevated PASP. - Chest x-ray showed cardiomegaly with mildly enlarged central pulmonary vessel but no overt edema. BNP 912 - Home Lasix held on admit and patient actually given some IV fluids on admission due to sepsis/ AKI. Diuretics restarted 8/17.  - RHC this admit showed continued volume overload and low output. CI 2.1  - milrinone started, continues on 0.125. No PICC  to check co-ox - volume status improving w/ milrinone and IV Lasix  - continue milrinone at 0.125 and IV Lasix 80 mg bid today  - intolerant to Losartan due to hyperkalemia  - no dig w/ AKI  - Continue unna boots   3. CAD  - S/p CABG x4 in 11/2016.  - High-sensitivity troponin minimally elevated and flat at 22 >> 24. Not consistent with ACS. Suspect demand ischemia in setting of sepsis and atrial fibrillation with RVR. - No s/s angina - No Aspirin with need for Eliquis.  - Continue statin.  4. 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 Had VF arrest in 11/2016 with appropriate ICD shock.  - No recurrent VT/VF - Had not been able to tolerate Amiodarone or beta-blocker in the past.  5. Sepsis Secondary to Bilateral Lower Extremity Cellulitis - initially placed on broad spectrum abx w/ vanc + meropenum. AF. Lactic acid normalized.  Blood cultures NGTD  - has completed course of abx, AF.  - Resolved  6. 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 - Diagnosed in 12/2019. Lynch syndrome diagnosed by genetic study.  - Follows with Dr. Marin Olp as outpatient. On immunotherapy   7. Hyperkalemia - required lokelma this admit  - K stable today at 3.8  - give supp K today w/ IV Lasix   8. Type 2 Diabetes Mellitus - Management per primary team.  9. Acute on Chronic Kidney Disease - baseline SCr 1.0-1.30 range  - Creatinine 2.20 on admission, 2/2 sepsis and deceased PO  intake with constant nausea and frequent vomiting - suspect component of cardiorenal syndrome as well - SCr trending back down w/ inotrope + diuresis, 2.18>>2.07>>2.02 today  -  Follow BMP    Length of Stay: [address], PA-C  04/02/2020, 7:48 AM   Advanced Heart Failure Team Pager [phone] (M-F; 7a - 4p)  Please contact [facility] for night-coverage after hours (4p -7a ) and weekends on [email or web address]   Patient seen and examined with the above-signed Advanced Practice Provider and/or Housestaff. I personally reviewed laboratory data, imaging studies and relevant notes. I independently examined the patient and formulated the important aspects of the plan. I have edited the note to reflect any of my changes or salient points. I have personally discussed the plan with the patient and/or family.  Feels weak and SOB. Has not been diuresing well. Remains in AF with RVR  General:  Weak appearing. No resp difficulty HEENT: normal Neck: supple.JVP to jaw Carotids 2+ bilat; no bruits. No lymphadenopathy or thryomegaly appreciated. Cor: PMI nondisplaced. Irregular tachy 2/6 TR Lungs: clear Abdomen: soft, nontender, + distended. No hepatosplenomegaly. No bruits or masses. Good bowel sounds. Extremities: no cyanosis, clubbing, rash, 3+ edema into thighs + UNNA  Neuro: alert & orientedx3, cranial nerves grossly intact. moves all 4 extremities w/o difficulty. Affect pleasant  She has severe HF R>>L with marked overload. She is not responding to low-dose milrinone or IV lasix. We will place PICC for invasive monitoring. Switch to lasix gtt. Follow co-ox/CVP. Hoepfully we can get her to respond.  Glori Bickers, MD  6:39 PM

## 2020-04-02 NOTE — Progress Notes (Signed)
PT Cancellation Note  Patient Details Name: [name] MRN: [id number] DOB: [birth year]   Cancelled Treatment:    Reason Eval/Treat Not Completed: Pain limiting ability to participate Pt reporting increased pain and becoming tearful. Refusing to participate. Will follow up as schedule allows.   Reuel Derby, PT, DPT  Acute Rehabilitation Services  Pager: [phone] Office: [phone]    Rudean Hitt 04/02/2020, 11:40 AM

## 2020-04-02 NOTE — Progress Notes (Signed)
Overall, [name] is doing okay.  She still has the back discomfort but does seem to be tolerating this a lot better.  She has not had any nausea.  She is eating better.  She still has some tachycardia from the atrial fibrillation.  There has been no bleeding.  She still has the leg swelling.  Her blood pressure is little bit better.  She says of the metoprolol dose was decreased.  She has had no problems with bowels or bladder.  Her labs show white cell count of 8.5.  Hemoglobin 9.4.  Platelet count 279,000.  Her BUN and creatinine are going up.  BUN is 56 creatinine 2.07.  Calcium is 6.7 with an albumin of 2.4.  Her vital signs are temperature 97.8.  Pulse 120.  Blood pressure 133/69.  Her lungs sound clear bilaterally.  Cardiac exam tachycardic and irregular.  Abdomen is soft.  She has decent bowel sounds.  There is no fluid wave.  Back exam does show some tenderness in the upper thoracic spine just to the left of the midline.  Extremities shows wrappings around both lower legs.  She still has edema in her legs.  Neurological exam is nonfocal.  Ms. Wojnar is still dealing with the congestive heart failure.  She has a edema in the legs.  She has cellulitis.  Her legs are wrapped.  She does have metastatic breast cancer.  She is due for next dose of pembrolizumab when she gets out of the hospital.  I will also see if she can have any more radiation therapy once she is an outpatient.  I appreciate the wonderful care that she is getting from all the staff up on [address].  Lattie Haw, MD  Oswaldo Milian 41:10

## 2020-04-02 NOTE — Progress Notes (Addendum)
Advanced Heart Failure Rounding Note  PCP-Cardiologist: No primary care provider on file.     Patient Profile   74 y/o woman with recently diagnosed metastatic breast CA (undergoing XRT), CAD s/p CABG, DM2, systolic HF with EF 03-50% and PAF.   Admitted with sepsis and lactic acidosis due to severe LE cellulitis in setting of marked volume overload. Course complicated by AF with RVR and worsening HF   Subjective:    RHC on  8/27 with biventricular HF, low output and volume overload.   Started on milrinone and IV lasix. Metoprolol stopped over the weekend due to low BP. Midodrine increased. BP now better. AF going fast.   Now starting to diurese. Creatinine slightly better today. 2.18 -> 2.07   Objective:   Weight Range: 75.3 kg Body mass index is 28.48 kg/m.   Vital Signs:   Temp:  [96.5 F (35.8 C)-97.8 F (36.6 C)] 97.8 F (36.6 C) (08/30 0535) Pulse Rate:  [92-118] 118 (08/30 0535) Resp:  [16-24] 20 (08/30 0535) BP: (109-133)/(61-69) 133/69 (08/30 0535) SpO2:  [94 %-98 %] 96 % (08/30 0535) Weight:  [75.3 kg] 75.3 kg (08/30 0535) Last BM Date: 04/01/20  Weight change: Filed Weights   03/31/20 0500 04/01/20 0621 04/02/20 0535  Weight: 75.1 kg 75.7 kg 75.3 kg    Intake/Output:   Intake/Output Summary (Last 24 hours) at 04/02/2020 0807 Last data filed at 04/02/2020 0548 Gross per 24 hour  Intake 600 ml  Output 600 ml  Net 0 ml      Physical Exam   General: Sitting up in bed . No resp difficulty HEENT: normal Neck: supple. JVP to jaw Carotids 2+ bilat; no bruits. No lymphadenopathy or thryomegaly appreciated. Cor: PMI nondisplaced. Irreg tachy Lungs: clear Abdomen: soft, nontender, nondistended. No hepatosplenomegaly. No bruits or masses. Good bowel sounds. Extremities: no cyanosis, clubbing, rash, 2+ edema +UNNA Neuro: alert & orientedx3, cranial nerves grossly intact. moves all 4 extremities w/o difficulty. Affect pleasant    Telemetry   AF  110-120s Personally reviewed   Labs    CBC Recent Labs    03/09/2020 1427 04/02/20 0153  WBC  --  8.5  NEUTROABS  --  6.8  HGB 11.9*  11.2* 9.4*  HCT 35.0*  33.0* 30.8*  MCV  --  89.8  PLT  --  093   Basic Metabolic Panel Recent Labs    04/01/20 0242 04/02/20 0153  NA 133* 134*  K 3.6 3.7  CL 99 98  CO2 24 23  GLUCOSE 154* 109*  BUN 53* 56*  CREATININE 2.18* 2.07*  CALCIUM 6.5* 6.7*  MG  --  2.1  PHOS  --  4.5   Liver Function Tests Recent Labs    04/01/20 0813 04/02/20 0153  AST  --  20  ALT  --  21  ALKPHOS  --  106  BILITOT  --  1.4*  PROT  --  5.5*  ALBUMIN 2.3* 2.4*   No results for input(s): LIPASE, AMYLASE in the last 72 hours. Cardiac Enzymes No results for input(s): CKTOTAL, CKMB, CKMBINDEX, TROPONINI in the last 72 hours.  BNP: BNP (last 3 results) Recent Labs    12/04/19 1324 03/18/20 0229  BNP 581.1* 912.0*    ProBNP (last 3 results) No results for input(s): PROBNP in the last 8760 hours.   D-Dimer No results for input(s): DDIMER in the last 72 hours. Hemoglobin A1C No results for input(s): HGBA1C in the last 72 hours. Fasting Lipid  Panel No results for input(s): CHOL, HDL, LDLCALC, TRIG, CHOLHDL, LDLDIRECT in the last 72 hours. Thyroid Function Tests No results for input(s): TSH, T4TOTAL, T3FREE, THYROIDAB in the last 72 hours.  Invalid input(s): FREET3  Other results:   Imaging    [place] EKG SITE RITE  Result Date: 04/01/2020 If Site Rite image not attached, placement could not be confirmed due to current cardiac rhythm.    Medications:     Scheduled Medications: . amiodarone  200 mg Oral Daily  . apixaban  5 mg Oral BID  . calcium carbonate  1 tablet Oral TID  . exemestane  25 mg Oral QPC breakfast  . furosemide  80 mg Intravenous BID  . insulin aspart  0-15 Units Subcutaneous TID AC & HS  . lactose free nutrition  237 mL Oral BID BM  . midodrine  5 mg Oral BID WC  . mirtazapine  7.5 mg Oral QHS  . morphine   15 mg Oral Q12H  . multivitamin with minerals  1 tablet Oral Daily  . OLANZapine  5 mg Oral QHS  . polyethylene glycol  17 g Oral BID  . senna  2 tablet Oral BID    Infusions: . milrinone 0.125 mcg/kg/min (03/31/20 1600)  . ondansetron (ZOFRAN) IV      PRN Medications: acetaminophen **OR** acetaminophen, HYDROmorphone (DILAUDID) injection, hydrOXYzine, LORazepam, morphine, ondansetron **OR** ondansetron (ZOFRAN) IV, sodium chloride   Assessment/Plan    1. Atrial Fibrillation with RVR - S/p MAZE and LAA clipping at time of CABG in 11/2016. S/p DCCV in 09/2015 and more recently 12/09/2019. Noted to be back in atrial fibrillation at last office visit on 01/30/2020 and started on low dose PO Amiodarone. - Presented in atrial fibrillation with RVR in the setting of sepsis secondary to cellulitis of bilateral lower extremities. Difficult situation complicated by patient's intolerance to multiple cardiac meds.  - Not been able to tolerate IV Amiodarone due to severe nausea. - Not candidate for CCB or dig - continue to treat w/ low dose PO amio, 200 mg daily  (refuses higher dose) - Toprol stopped over the weekend due to low BP. BP improved with increased midodrine. Will restart Toprol today - Continue Eliquis 5mg  twice daily.  No bleeding - Can consider DC-CV if EF falling on echo and/or not responding to millrinone  2. Acute on Chronic Combined CHF - History of ischemic cardiomyopathy. PYP in the past negative for amyloid. - Most recent Echo in 12/2019 showed LVEF of 35-40% with severe asymmetric LVH, mild MR, moderate TR, and severely elevated PASP. - RHC on 03/17/2020 with biventricular HF (R>L), volume overload and low output. - Continue milrinone and IV lasix. Seems to be starting to mobilize fluid. Renal function improving. Will give dose of metolazone - Continue milrinone and IV lasix.  - repeat echo   3. CAD  - S/p CABG x4 in 11/2016.  - High-sensitivity troponin minimally elevated  and flat at 22 >> 24. Not consistent with ACS. Suspect demand ischemia in setting of sepsis and atrial fibrillation with RVR. - No s/s angina - No Aspirin with need for Eliquis.  - Continue statin.  4. 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 Had VF arrest in 11/2016 with appropriate ICD shock.  - No recurrent VT/VF  5. Sepsis Secondary to Bilateral Lower Extremity Cellulitis - initially placed on broad spectrum abx w/ vanc + meropenum. AF. Lactic acid normalized.  Blood cultures NGTD  - has completed course of  abx, AF.  - Resolved  6. 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 - Diagnosed in 12/2019. Lynch syndrome diagnosed by genetic study.  - Follows with Dr. Marin Olp as outpatient. On immunotherapy   7. Hyperkalemia/hypokalemia - required lokelma this admit  - K 3.7 today. Will supp   8. Type 2 Diabetes Mellitus - Management per primary team.  9. Acute on Chronic Kidney Disease - baseline SCr 1.0-1.30 range  - Creatinine 2.20 on admission, 2/2 sepsis and deceased PO intake with constant nausea and frequent vomiting -Overall improved but trending back up w/ diuresis,  1.4>>1.6 >> 1.8 -> 1.9 -> 2.18 -> 2.08. Follow with diuresis  - Holding lasix. RHC today - Would avoid toradol if at all possible with AKI  Length of Stay: [place], MD  04/02/2020, 8:07 AM   Advanced Heart Failure Team Pager [phone] (M-F; 7a - 4p)  Please contact [facility] for night-coverage after hours (4p -7a ) and weekends on [email or web address]

## 2020-04-02 NOTE — Progress Notes (Signed)
°   04/02/20 1045  Clinical Encounter Type  Visited With Patient  Visit Type Follow-up  Referral From Physician  Consult/Referral To Chaplain  Spiritual Encounters  Spiritual Needs Prayer  This chaplain responded to MD consult for prayer.  The Pt is sitting up on the bedside and responds to the chaplain pain is better controlled today.  The chaplain understands the Pt. is having conversations with her family and anticipating discharge with outpatient Palliative Care.  The Pt. expressed a mixture ofcuriosity and faithfulness in what End of Life may look like for her.  The chaplain shared prayer with the Pt. and plans to F/U with spiritual care.

## 2020-04-02 NOTE — Progress Notes (Signed)
[facility]) Community Based Palliative Care       This patient has been referred to our palliative care services in the community.  [facility] will continue to follow for any discharge planning needs and to coordinate continuation of palliative care.   If you have questions or need assistance, please call [phone] or contact the hospital Liaison listed on AMION.     Thank you for the opportunity to participate in this patient's care.     Chrislyn King, BSN, RN [facility] Hospital Liaison   [phone]  

## 2020-04-03 ENCOUNTER — Inpatient Hospital Stay (HOSPITAL_COMMUNITY): Payer: PPO

## 2020-04-03 ENCOUNTER — Inpatient Hospital Stay: Payer: Self-pay

## 2020-04-03 DIAGNOSIS — Z66 Do not resuscitate: Secondary | ICD-10-CM

## 2020-04-03 LAB — CBC WITH DIFFERENTIAL/PLATELET
Abs Immature Granulocytes: 0.03 10*3/uL (ref 0.00–0.07)
Basophils Absolute: 0.1 10*3/uL (ref 0.0–0.1)
Basophils Relative: 1 %
Eosinophils Absolute: 0.7 10*3/uL — ABNORMAL HIGH (ref 0.0–0.5)
Eosinophils Relative: 10 %
HCT: 31.2 % — ABNORMAL LOW (ref 36.0–46.0)
Hemoglobin: 9.4 g/dL — ABNORMAL LOW (ref 12.0–15.0)
Immature Granulocytes: 0 %
Lymphocytes Relative: 7 %
Lymphs Abs: 0.5 10*3/uL — ABNORMAL LOW (ref 0.7–4.0)
MCH: 27 pg (ref 26.0–34.0)
MCHC: 30.1 g/dL (ref 30.0–36.0)
MCV: 89.7 fL (ref 80.0–100.0)
Monocytes Absolute: 0.8 10*3/uL (ref 0.1–1.0)
Monocytes Relative: 11 %
Neutro Abs: 4.8 10*3/uL (ref 1.7–7.7)
Neutrophils Relative %: 71 %
Platelets: 253 10*3/uL (ref 150–400)
RBC: 3.48 MIL/uL — ABNORMAL LOW (ref 3.87–5.11)
RDW: 22.8 % — ABNORMAL HIGH (ref 11.5–15.5)
WBC: 6.8 10*3/uL (ref 4.0–10.5)
nRBC: 0 % (ref 0.0–0.2)

## 2020-04-03 LAB — COMPREHENSIVE METABOLIC PANEL
ALT: 20 U/L (ref 0–44)
AST: 20 U/L (ref 15–41)
Albumin: 2.3 g/dL — ABNORMAL LOW (ref 3.5–5.0)
Alkaline Phosphatase: 106 U/L (ref 38–126)
Anion gap: 13 (ref 5–15)
BUN: 61 mg/dL — ABNORMAL HIGH (ref 8–23)
CO2: 23 mmol/L (ref 22–32)
Calcium: 6.9 mg/dL — ABNORMAL LOW (ref 8.9–10.3)
Chloride: 97 mmol/L — ABNORMAL LOW (ref 98–111)
Creatinine, Ser: 2.02 mg/dL — ABNORMAL HIGH (ref 0.44–1.00)
GFR calc Af Amer: 28 mL/min — ABNORMAL LOW (ref >60)
GFR calc non Af Amer: 24 mL/min — ABNORMAL LOW (ref >60)
Glucose, Bld: 74 mg/dL (ref 70–99)
Potassium: 3.8 mmol/L (ref 3.5–5.1)
Sodium: 133 mmol/L — ABNORMAL LOW (ref 135–145)
Total Bilirubin: 1.8 mg/dL — ABNORMAL HIGH (ref 0.3–1.2)
Total Protein: 5.4 g/dL — ABNORMAL LOW (ref 6.5–8.1)

## 2020-04-03 LAB — PHOSPHORUS: Phosphorus: 4.5 mg/dL (ref 2.5–4.6)

## 2020-04-03 LAB — GLUCOSE, CAPILLARY
Glucose-Capillary: 61 mg/dL — ABNORMAL LOW (ref 70–99)
Glucose-Capillary: 83 mg/dL (ref 70–99)
Glucose-Capillary: 104 mg/dL — ABNORMAL HIGH (ref 70–99)
Glucose-Capillary: 242 mg/dL — ABNORMAL HIGH (ref 70–99)
Glucose-Capillary: 271 mg/dL — ABNORMAL HIGH (ref 70–99)

## 2020-04-03 LAB — MAGNESIUM: Magnesium: 2.1 mg/dL (ref 1.7–2.4)

## 2020-04-03 MED ORDER — GABAPENTIN 100 MG PO CAPS
200.0000 mg | ORAL_CAPSULE | Freq: Three times a day (TID) | ORAL | Status: DC
  Administered 2020-04-03 – 2020-04-05 (×9): 200 mg via ORAL
  Filled 2020-04-03 (×9): qty 2

## 2020-04-03 MED ORDER — SODIUM CHLORIDE 0.9 % IV SOLN
30.0000 mg | Freq: Once | INTRAVENOUS | Status: AC
  Administered 2020-04-03: 30 mg via INTRAVENOUS
  Filled 2020-04-03: qty 3

## 2020-04-03 MED ORDER — MORPHINE SULFATE ER 15 MG PO TBCR
15.0000 mg | EXTENDED_RELEASE_TABLET | Freq: Three times a day (TID) | ORAL | Status: DC
  Administered 2020-04-03 – 2020-04-06 (×11): 15 mg via ORAL
  Filled 2020-04-03 (×11): qty 1

## 2020-04-03 MED ORDER — FUROSEMIDE 10 MG/ML IJ SOLN
30.0000 mg/h | INTRAVENOUS | Status: DC
  Administered 2020-04-03 – 2020-04-04 (×3): 15 mg/h via INTRAVENOUS
  Administered 2020-04-05 – 2020-04-06 (×3): 30 mg/h via INTRAVENOUS
  Filled 2020-04-03 (×3): qty 25
  Filled 2020-04-03: qty 5
  Filled 2020-04-03 (×2): qty 25

## 2020-04-03 MED ORDER — SODIUM CHLORIDE 0.9% FLUSH
10.0000 mL | INTRAVENOUS | Status: DC | PRN
  Administered 2020-04-10 – 2020-04-12 (×2): 10 mL

## 2020-04-03 MED ORDER — POTASSIUM CHLORIDE CRYS ER 20 MEQ PO TBCR
40.0000 meq | EXTENDED_RELEASE_TABLET | Freq: Once | ORAL | Status: AC
  Administered 2020-04-03: 40 meq via ORAL
  Filled 2020-04-03: qty 2

## 2020-04-03 MED ORDER — CHLORHEXIDINE GLUCONATE CLOTH 2 % EX PADS
6.0000 | MEDICATED_PAD | Freq: Every day | CUTANEOUS | Status: DC
  Administered 2020-04-04 – 2020-04-06 (×3): 6 via TOPICAL

## 2020-04-03 MED ORDER — SODIUM CHLORIDE 0.9% FLUSH
10.0000 mL | Freq: Two times a day (BID) | INTRAVENOUS | Status: DC
  Administered 2020-04-05 – 2020-04-15 (×13): 10 mL

## 2020-04-03 MED ORDER — DEXAMETHASONE SODIUM PHOSPHATE 4 MG/ML IJ SOLN: 30.0000 mg | Freq: Once | INTRAMUSCULAR | Status: DC

## 2020-04-03 NOTE — Progress Notes (Signed)
Peripherally Inserted Central Catheter Placement  The IV Nurse has discussed with the patient and/or persons authorized to consent for the patient, the purpose of this procedure and the potential benefits and risks involved with this procedure.  The benefits include less needle sticks, lab draws from the catheter, and the patient may be discharged home with the catheter. Risks include, but not limited to, infection, bleeding, blood clot (thrombus formation), and puncture of an artery; nerve damage and irregular heartbeat and possibility to perform a PICC exchange if needed/ordered by physician.  Alternatives to this procedure were also discussed.  Bard Power PICC patient education guide, fact sheet on infection prevention and patient information card has been provided to patient /or left at bedside.    PICC Placement Documentation  PICC Double Lumen 98/33/82 PICC Right Basilic 40 cm 0 cm (Active)  Indication for Insertion or Continuance of Line Vasoactive infusions 04/03/20 2103  Exposed Catheter (cm) 0 cm 04/03/20 2103  Site Assessment Clean;Intact;Dry 04/03/20 2103  Lumen #1 Status Flushed;Saline locked;Blood return noted 04/03/20 2103  Lumen #2 Status Flushed;Saline locked;Blood return noted 04/03/20 2103  Dressing Type Transparent 04/03/20 2103  Dressing Status Clean;Dry;Intact;Antimicrobial disc in place 04/03/20 2103  Safety Lock Not Applicable 50/53/97 6734  Dressing Intervention New dressing 04/03/20 2103  Dressing Change Due 04/10/20 04/03/20 2103       Gordan Payment 04/03/2020, 9:04 PM

## 2020-04-03 NOTE — Progress Notes (Addendum)
Physical Therapy Treatment Patient Details Name: [name] MRN: [id number] DOB: [birth year] Today's Date: 04/03/2020    History of Present Illness [name] is a 74 y.o. 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 fib, ventricular tachycardia status post ICD in situ, breast cancer with bone mets, Lynch syndrome, Patient presented to the ED on 03/18/2020 with complaint of shortness of breath, worsening redness on both upper legs with weeping lesions    PT Comments    Patient progressing slowly; reports pain is more controlled this AM after being given pain medicine. Worked on standing trials with use of RW and Min A for support. Able to take a few steps to get back to bed from [facility] with Min A. Pt with impaired balance and weakness in BLEs. Requires max coaxing to participate in mobility. Continues to be a high fall risk. POC updated and goals continue to be appropriate. Will follow.   Follow Up Recommendations  Supervision/Assistance - 24 hour (refuses all follow up)     Equipment Recommendations  3in1 (PT)    Recommendations for Other Services       Precautions / Restrictions Precautions Precautions: Fall Precaution Comments: unna boots Restrictions Weight Bearing Restrictions: No    Mobility  Bed Mobility               General bed mobility comments: Sitting EOB upon PT arrival.  Transfers Overall transfer level: Needs assistance Equipment used: Rolling walker (2 wheeled);None Transfers: Sit to/from [employer] to Stand: Min assist Stand pivot transfers: Min assist       General transfer comment: Assist to power to standing with cues for technique, fell back onto bed without assist; Stood from EOB x2, from [facility] x2; SPT bed to/from [facility] Min A. Not able to stand upright, leaning over and holding onto rails.  Ambulation/Gait Ambulation/Gait assistance: Min assist Gait  Distance (Feet): 5 Feet Assistive device: Rolling walker (2 wheeled) Gait Pattern/deviations: Trunk flexed;Shuffle     General Gait Details: Able to take a few steps to get back to bed with use of RW; bil knee instability but no buckling.   Stairs             Wheelchair Mobility    Modified Rankin (Stroke Patients Only)       Balance Overall balance assessment: Needs assistance Sitting-balance support: Feet supported;No upper extremity supported Sitting balance-Leahy Scale: Good     Standing balance support: During functional activity Standing balance-Leahy Scale: Poor Standing balance comment: reliant on UE's and RW or external support. Able to perform pericare in standing with UE support.                            Cognition Arousal/Alertness: Awake/alert Behavior During Therapy: WFL for tasks assessed/performed Overall Cognitive Status: Impaired/Different from baseline Area of Impairment: Awareness;Problem solving;Attention                   Current Attention Level: Selective     Safety/Judgement: Decreased awareness of safety;Decreased awareness of deficits Awareness: Intellectual Problem Solving: Slow processing;Requires tactile cues General Comments: Internally distracted by pain/needs. Requires max coaxing to participate.      Exercises      General Comments General comments (skin integrity, edema, etc.): Reports swelling looks better in LEs.      Pertinent Vitals/Pain Pain Assessment: Faces Faces Pain Scale: Hurts  little more Pain Location: generalized Pain Descriptors / Indicators: Sore;Aching;Discomfort Pain Intervention(s): Monitored during session;Premedicated before session;Limited activity within patient's tolerance    Home Living                      Prior Function            PT Goals (current goals can now be found in the care plan section) Acute Rehab PT Goals Patient Stated Goal: feel better PT Goal  Formulation: With patient Time For Goal Achievement: 04/17/20 Potential to Achieve Goals: Fair Progress towards PT goals: Progressing toward goals (slowly)    Frequency    Min 3X/week      PT Plan Current plan remains appropriate    Co-evaluation              AM-PAC PT "6 Clicks" Mobility   Outcome Measure  Help needed turning from your back to your side while in a flat bed without using bedrails?: None Help needed moving from lying on your back to sitting on the side of a flat bed without using bedrails?: A Little Help needed moving to and from a bed to a chair (including a wheelchair)?: A Little Help needed standing up from a chair using your arms (e.g., wheelchair or bedside chair)?: A Little Help needed to walk in hospital room?: A Little Help needed climbing 3-5 steps with a railing? : A Lot 6 Click Score: 18    End of Session Equipment Utilized During Treatment: Gait belt Activity Tolerance: Patient tolerated treatment well;Patient limited by fatigue Patient left: in bed;with call bell/phone within reach (sitting EOB) Nurse Communication: Mobility status PT Visit Diagnosis: Unsteadiness on feet (R26.81);Difficulty in walking, not elsewhere classified (R26.2);Pain Pain - part of body:  (generalized)     Time: 8250-5397 PT Time Calculation (min) (ACUTE ONLY): 27 min  Charges:  $Therapeutic Activity: 23-37 mins                     Marisa Severin, PT, DPT Acute Rehabilitation Services Pager [phone] Office [phone]       Marguarite Arbour A Sabra Heck 04/03/2020, 10:43 AM

## 2020-04-03 NOTE — Progress Notes (Signed)
PROGRESS NOTE    [name]  IFO:[id number] DOB: 1946/02/22 DOA: 03/06/2020 PCP: Patient, No Pcp Per   Chief Complaint  Patient presents with  . Chest Pain   Brief Narrative:  [name] is [name] 74 y.o.year old femalewith medical history significant for type 2 diabetes, CAD status post CABG, chronic combined CHF with EF of 35-40% on echo on 12/2019, paroxysmal atrial fibrillation on Eliquis, history of V. fib status post ICD, breast cancer with bone mets/Lynch syndrome who presented on 8/14 with several days of shortness of breath, worsening redness of lower legs and weeping lesions for the past 2 weeks in setting of using Derm stone to scrape her dry skin and found to have [name]. fib with RVR to the 120s and sepsis physiology (white count of 24.5, lactic acid 2.2, Respiratory rate of 21) secondary to bilateral lower extremity cellulitis. course complicated by [name]. fib with RVR, AKI on CKD with hyperkalemia.  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  --Cardiology/heart failure service following, appreciate assistance -- PICC per cardiology for invasive monitoring --Right heart cath today shows severe heart failure right more than left with low output and volume overload --Continue Unna boots --Continue monitor strict I's and O's and daily weights --Started on IV Lasix and milrinone infusion.  Metolazone  per cards. -> transition to lasix gtt per cards, follow co-ox/CVP -- repeat echo with EF 35-40%, global hypokinesis (see report)  Sepsis secondary to nonpurulent cellulitis of bilateral lower extremities, resolved.  -Presented to the ER with tachycardia, tachypnea, leukocytosis in the setting of extensive left thigh cellulitis with associated AKI.  Patient was initially started on empiric antibiotics with vancomycin and meropenem which was transitioned to ceftriaxone in which she completed [name] 7-day course ending on 03/25/2020.  [name]. fib with RVR. Continue amiodarone Restart toprol Eliquis Per HF team, consider DCCV if EF falling on echo and not responding to milrinone  Hypotension - SBP 100 --continue midodrine 2.5 mg BID per Cardiology recommendations --toprol d/c'd per cards  Breast cancer with bone metastasis: --Followed by [name].  Patient is on pain medications with MS Contin, short-acting morphine and as needed Dilaudid.  Appreciate assistance, he's adjusting her meds. - gave decadron x1 --Patient is currently on immunotherapy. --Prognosis remains poor given metastatic breast cancer along with severe cardiovascular morbidities. --Appreciate palliative care involvement. --Home with home palliative care.  History of VT/V. fib, status post ICD (2016), history of V. fib arrest in 11/2016 -Has not been able to tolerate amiodarone or beta blockade in the past -Currently on amiodarone 200 mg daily --Continue to monitor on telemetry  Type 2 diabetes  Hypoglycemia   Hemoglobin A1c A1c 7.2.  - hypoglycemia this AM, continue to monitor after steroids -Holding home glipizide -Monitor CBG, sliding scale as needed --Nutrition consulted to assist with diet supplementation  Acute on CKD with hyperkalemia  Peak creatinine of 2.2, baseline of ~1.6. Required lokelma on  8/20 for recurrent hyperkalemia, now improved at 4.3. --creatinine 2.02 today --Avoid NSAIDs and  Toradol. --Monitor BMP daily --Continue to monitor urinary output closely  Hypocalcemia: replace oral, follow  DVT prophylaxis: eliquis Code Status: DNR Family Communication: none at bedside Disposition:   Status is: Inpatient  Remains inpatient appropriate because:Inpatient level of care appropriate due to severity of illness   Dispo:  Patient From: Home  Planned Disposition: To be determined  Expected discharge date: 04/02/20  Medically stable for discharge: No   Consultants:   Cardiology  oncology  Procedures:  Echo IMPRESSIONS    1. Left ventricular ejection fraction, by estimation, is 35 to 40%. The  left ventricle has moderately decreased function. The left ventricle  demonstrates global hypokinesis with basal to mid inferior akinesis. There  is moderate asymmetric left  ventricular hypertrophy of the basal-septal segment. Left ventricular  diastolic function could not be evaluated.  2. Left atrial size was severely dilated.  3. The mitral valve is abnormal. Mild mitral valve regurgitation.  4. Tricuspid valve regurgitation is moderate.  5. The aortic valve is tricuspid (previously reported as bicuspid, but  clearly visualized in this study as tricuspid). Aortic valve regurgitation  is not visualized.  6. RV systolic function not quantitated, but qualitatively appears  depressed. Right ventricular systolic function is mildly reduced. [name] AICD  wire noted is visualized. There is moderately elevated pulmonary artery  systolic pressure. The estimated right  ventricular systolic pressure is 63.1 mmHg.  7. The inferior vena cava is dilated in size with <50% respiratory  variability, suggesting right atrial pressure of 15 mmHg.  8. Right atrial size was moderately dilated.   RHC Assessment: 1. 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  Watch for increased AF rate.   Prognosis concerning given hemodynamics.   LE [place] Summary:  RIGHT:  - There is no evidence of deep vein thrombosis in the lower extremity.  However, portions of this examination were limited- see technologist  comments above.    - No cystic structure found in the popliteal fossa.  - calf veins not visulaized due to bandages    LEFT:  - There is no evidence of deep vein thrombosis in the lower extremity.  However, portions of this examination were limited- see technologist  comments above.    - Jordis Repetto cystic structure is found in the popliteal fossa.  - calf veins not visulaized due to bandages     Antimicrobials: Anti-infectives (From admission, onward)   Start     Dose/Rate Route Frequency Ordered Stop   03/20/20 1430  cefTRIAXone (ROCEPHIN) 2 g in sodium chloride 0.9 % 100 mL IVPB        2 g 200 mL/hr over 30 Minutes Intravenous Every 24 hours 03/20/20 1345 03/25/20 1320   03/20/20 0600  vancomycin (VANCOREADY) IVPB 1250 mg/250 mL  Status:  Discontinued        1,250 mg 166.7 mL/hr over 90 Minutes Intravenous Every 48 hours 03/18/20 0230 03/20/20 1345   03/18/20 0330  vancomycin (VANCOREADY) IVPB 1500 mg/300 mL        1,500 mg 150 mL/hr over 120 Minutes Intravenous  Once 03/18/20 0230 03/18/20 0803   03/18/20 0330  meropenem (MERREM) 1 g in sodium chloride 0.9 % 100 mL IVPB  Status:  Discontinued        1 g 200 mL/hr over 30 Minutes Intravenous  2 times daily 03/18/20 0230 03/20/20 1345   03/18/20 0130  piperacillin-tazobactam (ZOSYN) IVPB 3.375 g  Status:  Discontinued        3.375 g 100 mL/hr over 30 Minutes Intravenous  Once 03/18/20 0115 03/18/20 0214     Subjective: C/o pain, better with Dr. Antonieta Pert adjustments  Objective: Vitals:   04/03/20 0744 04/03/20 1115 04/03/20 1418 04/03/20 1608  BP: (!) 96/53 107/60 115/79 (!) 128/91  Pulse: (!) 105 (!) 101 100 (!) 110  Resp: 18 19  20   Temp: (!) 97.5 F (36.4 C) 97.6 F (36.4 C)   (!) 97.5 F (36.4 C)  TempSrc: Oral Oral  Oral  SpO2: 93% 95%  95%  Weight:      Height:        Intake/Output Summary (Last 24 hours) at 04/03/2020 2054 Last data filed at 04/03/2020 1512 Gross per 24 hour  Intake 0 ml  Output 800 ml  Net -800 ml   Filed Weights   04/01/20 0621 04/02/20 0535 04/03/20 0400  Weight: 75.7 kg 75.3 kg 75.7 kg    Examination:  General: No acute distress. Cardiovascular: irregularly irregular Lungs: Clear to auscultation bilaterally Abdomen: Soft, nontender, nondistended  Neurological: Alert and oriented 3. Moves all extremities 4 with equal strength. Cranial nerves II through XII grossly intact. Skin: Warm and dry. No rashes or lesions. Extremities: LE edema    Data Reviewed: I have personally reviewed following labs and imaging studies  CBC: Recent Labs  Lab 03/28/20 0310 03/29/20 0158 03/05/2020 1427 04/02/20 0153 04/03/20 0151  WBC 8.9 8.0  --  8.5 6.8  NEUTROABS 6.8  --   --  6.8 4.8  HGB 9.9* 9.9* 11.9*  11.2* 9.4* 9.4*  HCT 32.8* 33.3* 35.0*  33.0* 30.8* 31.2*  MCV 88.9 89.8  --  89.8 89.7  PLT 326 296  --  279 917    Basic Metabolic Panel: Recent Labs  Lab 03/29/20 0158 03/29/20 0158 03/08/2020 0658 03/17/2020 0658 03/11/2020 1427 03/31/20 0708 04/01/20 0242 04/02/20 0153 04/03/20 0151  NA 133*   < > 131*   < > 135  139 135 133* 134* 133*  K 4.1   < > 4.3   < > 4.2  3.7 4.4 3.6 3.7 3.8  CL 96*   < > 95*  --   --  97* 99 98 97*  CO2 25   < > 24  --   --  24 24 23 23   GLUCOSE 68*   < > 95  --   --  96 154* 109* 74  BUN 42*   < > 45*  --   --  52* 53* 56* 61*  CREATININE 1.82*   < > 1.94*  --   --  2.18* 2.18* 2.07* 2.02*  CALCIUM 6.8*   < > 7.0*  --   --  6.8* 6.5* 6.7* 6.9*  MG 2.1  --   --   --   --   --   --  2.1 2.1  PHOS  --   --   --   --   --   --   --  4.5 4.5   < > = values in this interval not displayed.    GFR: Estimated Creatinine Clearance: 25.1 mL/min (Shatima Zalar) (by C-G formula based on SCr of 2.02 mg/dL  (H)).  Liver Function Tests: Recent Labs  Lab 03/28/20 0310 04/01/20 0813 04/02/20 0153 04/03/20 0151  AST 23  --  20  20  ALT 21  --  21 20  ALKPHOS 94  --  106 106  BILITOT 1.1  --  1.4* 1.8*  PROT 5.4*  --  5.5* 5.4*  ALBUMIN 2.5* 2.3* 2.4* 2.3*    CBG: Recent Labs  Lab 04/02/20 2109 04/03/20 0608 04/03/20 0657 04/03/20 1117 04/03/20 1610  GLUCAP 88 61* 83 104* 242*     No results found for this or any previous visit (from the past 240 hour(s)).       Radiology Studies: ECHOCARDIOGRAM LIMITED  Result Date: 04/02/2020    ECHOCARDIOGRAM LIMITED REPORT   Patient Name:   [name] Date of Exam: 04/02/2020 Medical Rec #:  [id number]        Height:       64.0 in Accession #:    [id number]       Weight:       165.9 lb Date of Birth:  [birth year]        BSA:          1.807 m Patient Age:    4 years         BP:           119/59 mmHg Patient Gender: F                HR:           113 bpm. Exam Location:  Inpatient Procedure: Limited Echo, Limited Color Doppler and Color Doppler Indications:    Congestive Heart Failure I50.21  History:        Patient has prior history of Echocardiogram examinations, most                 recent 12/09/2019. Hypertrophic Cardiomyopathy, CAD and Previous                 Myocardial Infarction, Prior CABG, Arrythmias:Atrial                 Fibrillation; Risk Factors:Diabetes.  Sonographer:    Mikki Santee RDCS (AE) Referring Phys: [id number] DANIEL R BENSIMHON IMPRESSIONS  1. Left ventricular ejection fraction, by estimation, is 35 to 40%. The left ventricle has moderately decreased function. The left ventricle demonstrates global hypokinesis with basal to mid inferior akinesis. There is moderate asymmetric left ventricular hypertrophy of the basal-septal segment. Left ventricular diastolic function could not be evaluated.  2. Left atrial size was severely dilated.  3. The mitral valve is abnormal. Mild mitral valve regurgitation.  4. Tricuspid valve  regurgitation is moderate.  5. The aortic valve is tricuspid (previously reported as bicuspid, but clearly visualized in this study as tricuspid). Aortic valve regurgitation is not visualized.  6. RV systolic function not quantitated, but qualitatively appears depressed. Right ventricular systolic function is mildly reduced. Naiara Lombardozzi AICD wire noted is visualized. There is moderately elevated pulmonary artery systolic pressure. The estimated right ventricular systolic pressure is 30.0 mmHg.  7. The inferior vena cava is dilated in size with <50% respiratory variability, suggesting right atrial pressure of 15 mmHg.  8. Right atrial size was moderately dilated. Comparison(s): Changes from prior study are noted. 12/09/2019: LVEF 35-40%, RVSP 71 mmHg, low normal RV systolic function. FINDINGS  Left Ventricle: Left ventricular ejection fraction, by estimation, is 35 to 40%. The left ventricle has moderately decreased function. The left ventricle demonstrates global hypokinesis. There is moderate asymmetric left ventricular hypertrophy of the basal-septal segment. Left ventricular diastolic function could not be evaluated due to atrial fibrillation. Right Ventricle: RV  systolic function not quantitated, but qualitatively appears depressed. Right ventricular systolic function is mildly reduced. There is moderately elevated pulmonary artery systolic pressure. The tricuspid regurgitant velocity is 3.26  m/s, and with an assumed right atrial pressure of 15 mmHg, the estimated right ventricular systolic pressure is 64.4 mmHg. Left Atrium: Left atrial size was severely dilated. Right Atrium: Right atrial size was moderately dilated. Pericardium: There is no evidence of pericardial effusion. Mitral Valve: The mitral valve is abnormal. There is moderate calcification of the posterior and anterior mitral valve leaflet(s). Moderate mitral annular calcification. Mild mitral valve regurgitation. Tricuspid Valve: The tricuspid valve is grossly  normal. Tricuspid valve regurgitation is moderate. Aortic Valve: The aortic valve is tricuspid. Aortic valve regurgitation is not visualized. Pulmonic Valve: The pulmonic valve was grossly normal. Pulmonic valve regurgitation is mild. Aorta: The aortic root and ascending aorta are structurally normal, with no evidence of dilitation. Venous: The inferior vena cava is dilated in size with less than 50% respiratory variability, suggesting right atrial pressure of 15 mmHg. IAS/Shunts: No atrial level shunt detected by color flow Doppler. Additional Comments: Rylynn Schoneman AICD wire noted is visualized. LEFT VENTRICLE PLAX 2D LVIDd:         4.50 cm LVIDs:         3.70 cm LV PW:         1.40 cm LV IVS:        1.50 cm LVOT diam:     2.20 cm LVOT Area:     3.80 cm  LEFT ATRIUM         Index LA diam:    4.40 cm 2.44 cm/m   AORTA Ao Root diam: 3.50 cm TRICUSPID VALVE TR Peak grad:   42.5 mmHg TR Vmax:        326.00 cm/s  SHUNTS Systemic Diam: 2.20 cm Lyman Bishop MD Electronically signed by Lyman Bishop MD Signature Date/Time: 04/02/2020/12:02:51 PM    Final    [place] EKG SITE RITE  Result Date: 04/03/2020 If Site Rite image not attached, placement could not be confirmed due to current cardiac rhythm.       Scheduled Meds: . amiodarone  200 mg Oral Daily  . apixaban  5 mg Oral BID  . calcium carbonate  1 tablet Oral TID  . exemestane  25 mg Oral QPC breakfast  . gabapentin  200 mg Oral TID  . insulin aspart  0-15 Units Subcutaneous TID AC & HS  . lactose free nutrition  237 mL Oral BID BM  . metoprolol succinate  25 mg Oral Daily  . midodrine  5 mg Oral BID WC  . mirtazapine  7.5 mg Oral QHS  . morphine  15 mg Oral Q8H  . multivitamin with minerals  1 tablet Oral Daily  . OLANZapine  5 mg Oral QHS  . polyethylene glycol  17 g Oral BID  . senna  2 tablet Oral BID   Continuous Infusions: . furosemide (LASIX) infusion    . milrinone 0.125 mcg/kg/min (04/03/20 1748)  . ondansetron (ZOFRAN) IV       LOS: 16  days    Time spent: over 84 min    Fayrene Helper, MD [facility]   To contact the attending provider between 7A-7P or the covering provider during after hours 7P-7A, please log into the web site [email or web address] and access using universal  password for that web site. If you do not have the password, please call the hospital operator.  04/03/2020, 8:54 PM

## 2020-04-03 NOTE — Progress Notes (Signed)
Consent obtained for PICC placement. Pt requested waiting until after she ate supper. Primary RN notified. Will follow up later this evening.

## 2020-04-03 NOTE — Progress Notes (Signed)
Unfortunately, [name] is having some pain issues this morning.  I have to believe that this is from the left paraspinal met.  Sounds like referred pain.  I will try her on gabapentin.  Hopefully she will be able to tolerate this.  I will increase the frequency of the MS Contin to every 8 hour dosing.  I think she would tolerate this better than increase MS Contin to 30 mg p.o. every 12 hours.  She still has atrial fibrillation.  She is on the milrinone.  She is on amiodarone.  She is on metoprolol.  She is having no nausea.  She seems to be eating okay.  Her labs show white cell count 6.8.  Hemoglobin 9.4.  Platelet count 253,000.  Her BUN is 61 creatinine 2.02.  Her calcium is 6.9 with an albumin of 2.3.  Her bilirubin is 1.8.  I will check a urinalysis on her.  I want to make sure that she does not have any kind of urinary tract infection causing some of the pain.  I would not think so but she has been in the hospital quite a bit.  She has had no fever.  There has been no bleeding.  She is on Eliquis.  We will continue to try to help with pain control.  I just want to make sure that she is comfortable.  I know that Toradol has helped with her but her kidney function is not that great right now.  I might try 1 dose of Decadron.  This may help with any swelling that might be occurring in the back.  I very much appreciate the great care that she is getting from all staff on 4 E.  Lattie Haw, MD  Michaelyn Barter 6:9-12

## 2020-04-03 NOTE — Progress Notes (Signed)
Nutrition Follow Up  DOCUMENTATION CODES:   Non-severe (moderate) malnutrition in context of chronic illness  INTERVENTION:    Boost Plus chocolate BID- Each supplement provides 360kcal and 14g protein.   Magic cup TID with meals, each supplement provides 290 kcal and 9 grams of protein  MVI daily    NUTRITION DIAGNOSIS:   Moderate Malnutrition related to chronic illness, cancer and cancer related treatments as evidenced by energy intake < 75% for > or equal to 1 month, mild fat depletion, moderate muscle depletion.  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 Presents this admission with sepsis secondary to nonpurulent cellulitis of bilateral lower extremities.   Pt reports appetite fluctuates. Last two meal completions charted as 100%. Drinking Boost Plus inconsistently. Not taking home supplement (Soylent). RD encouraged meal intake and increased supplement intake.   Admission weight: 74 kg  Current weight: 75.7 kg  Medications: 80 mg lasix BID, SS novolog, remeron, MVI, miralax, senokot  Labs: Na 133 (L) CBG 61-122  Diet Order:   Diet Order            Diet regular Room service appropriate? Yes with Assist; Fluid consistency: Thin  Diet effective now                 EDUCATION NEEDS:   Education needs have been addressed  Skin:  Skin Assessment: Skin Integrity Issues: Skin Integrity Issues:: Other (Comment) Other: weeping BLE  Last BM:  8/30  Height:   Ht Readings from Last 1 Encounters:  03/18/20 5\' 4"  (1.626 m)    Weight:   Wt Readings from Last 1 Encounters:   04/03/20 75.7 kg    BMI:  Body mass index is 28.65 kg/m.  Estimated Nutritional Needs:   Kcal:  1750-1950 kcal  Protein:  90-105 grams  Fluid:  >/= 1.7 L/day   Mariana Single RD, LDN Clinical Nutrition Pager listed in [place]

## 2020-04-03 NOTE — Progress Notes (Signed)
Daily Progress Note   Patient Name: [name]       Date: 04/03/2020 DOB: [birth year]  Age: 74 y.o. MRN#: [id number] Attending Physician: Elodia Florence., * Primary Care Physician: Patient, No Pcp Per Admit Date: 04/02/2020  Reason for Consultation/Follow-up: Establishing goals of care  Subjective: Patient awake, alert and oriented x3. She is sitting on the side of the bed watching television and eating popcorn. Complains of some back pain but feels it is improving with current regimen. Continues to have bilateral leg swelling. No family at the bedside.   Patient previously seen by our team. However, I introduced myself and re-introduced Palliative's role in her care while hospitalized. [name] verbalized understanding and appreciation of support.   Medical updates provided and reviewed her current plan of care extensively. She verbalized understanding of her current illness and co-morbidities. She was able to successfully summarize her understanding and clear expectations of hopes for some stability/pain relief/and improved quality of life. She openly acknowledged her co-morbidities and guarded prognosis, knowing eventually her health "would win" and she would be facing a more end-of-life situation. Therapeutic listening and support provided.   Emotional support provided as she discussed her family and home support/situations.   She shares at times she wonders "why her" "why does she have to go through the struggles she has had recently with her health and life" but also acknowledges her strong faith and strength that she gains from her grandchildren.   [name] is clear in her expressed wishes that she would not want to suffer and when she felt her quality of life was poor and she was suffering she would then be open to care more focused on her comfort rather than "attempts to fix things"! She shares she feels that her quality of life is ok at this point, despite pain and her heart and  metastatic cancer. She states she wishes to continue with aggressive interventions with an understanding that any and all treatments are more palliative focused.   She speaks to her trusting relationship with her heart failure team and Dr. Marin Olp as her Oncologist. She is hopeful for the opportunity to start radiation and immunotherapy if an option in the near future. We discussed at length challenges of treatment options due to her overall cardiac health. She verbalized understanding.   [name] confirms her wishes for DNR. She is clear in her expressed wishes for continued aggressive treatments with awareness of a palliative focus. Although she is remaining hopeful for the best (stability/continued or improved quality of life) she is also preparing herself for the worst in the future (worsening health, further decline, EOL care).   Patient has facial bumps under chin and on face with old blood present. She states she shaved with a razor today which has seemed to irritate her skin. Education provided on the use of razors and her Eliquis. I assisted with cleansing face and expressed need for possible topical creams to prevent infection or worsening skin irritation. She verbalized understanding and appreciation.   Support provided. She is appreciative of our support and request to continue throughout her hospitalization and at discharge.   All questions answered and support provided.    Length of Stay: 16 days  Vital Signs: BP (!) 128/91 (BP Location: Right Arm)   Pulse (!) 110   Temp (!) 97.5 F (36.4 C) (Oral)   Resp 20   Ht 5\' 4"  (1.626 m)   Wt 75.7 kg   SpO2 95%  BMI 28.65 kg/m  SpO2: SpO2: 95 % O2 Device: O2 Device: Room Air O2 Flow Rate: O2 Flow Rate (L/min): 0 L/min  Physical Exam: -awake, alert and oriented x3, chronically-ill -normal breathing pattern - Irregular -facial irritation with redness s/p shaving -bilateral lower extremity edema +1, Unna boots  -mood  appropriate, follows commands, sitting on side of bed, pleasant  Palliative Care Assessment & Plan    Code Status:  DNR  Goals of Care/Recommendations:  DNR/DNI  Continue with current plan of care per medical team  Patient remains hopeful for stability (understands prognosis and palliative interventions) also preparing for health decline/EOL in the future. Feels her current QOL is sufficient. Hopeful for ability to undergo radiation and immunotherapy if able to offer additional pain support and life-extension. Is clear and realistic in expectations and understanding if health further decline, options become more limited she would not wish to suffer and would then be open to more of a comfort focused care.   Pain currently manageable. No desire for changes at this time.  Bacitracin to facial areas irritated by shaving. Education provide on the use of a straight razor and Eliquis/compromised immune   PMT will continue to support and follow as needed.   Prognosis: Guarded-Poor   Discharge Planning: To Be Determined with outpatient palliative.    Care plan was discussed with patient, Dr. Florene Glen, and Dr. Marin Olp.   Thank you for allowing the Palliative Medicine Team to assist in the care of this patient.  Time Total: 45 min.   Visit consisted of counseling and education dealing with the complex and emotionally intense issues of symptom management and palliative care in the setting of serious and potentially life-threatening illness.Greater than 50%  of this time was spent counseling and coordinating care related to the above assessment and plan.  Alda Lea, AGPCNP-BC  Palliative Medicine Team [phone]

## 2020-04-04 LAB — COMPREHENSIVE METABOLIC PANEL
ALT: 23 U/L (ref 0–44)
AST: 22 U/L (ref 15–41)
Albumin: 2.7 g/dL — ABNORMAL LOW (ref 3.5–5.0)
Alkaline Phosphatase: 112 U/L (ref 38–126)
Anion gap: 14 (ref 5–15)
BUN: 75 mg/dL — ABNORMAL HIGH (ref 8–23)
CO2: 23 mmol/L (ref 22–32)
Calcium: 6.8 mg/dL — ABNORMAL LOW (ref 8.9–10.3)
Chloride: 95 mmol/L — ABNORMAL LOW (ref 98–111)
Creatinine, Ser: 2.27 mg/dL — ABNORMAL HIGH (ref 0.44–1.00)
GFR calc Af Amer: 24 mL/min — ABNORMAL LOW (ref >60)
GFR calc non Af Amer: 21 mL/min — ABNORMAL LOW (ref >60)
Glucose, Bld: 144 mg/dL — ABNORMAL HIGH (ref 70–99)
Potassium: 4.7 mmol/L (ref 3.5–5.1)
Sodium: 132 mmol/L — ABNORMAL LOW (ref 135–145)
Total Bilirubin: 1.5 mg/dL — ABNORMAL HIGH (ref 0.3–1.2)
Total Protein: 6.1 g/dL — ABNORMAL LOW (ref 6.5–8.1)

## 2020-04-04 LAB — CBC WITH DIFFERENTIAL/PLATELET
Abs Immature Granulocytes: 0.05 10*3/uL (ref 0.00–0.07)
Basophils Absolute: 0 10*3/uL (ref 0.0–0.1)
Basophils Relative: 0 %
Eosinophils Absolute: 0 10*3/uL (ref 0.0–0.5)
Eosinophils Relative: 0 %
HCT: 31.1 % — ABNORMAL LOW (ref 36.0–46.0)
Hemoglobin: 9.5 g/dL — ABNORMAL LOW (ref 12.0–15.0)
Immature Granulocytes: 1 %
Lymphocytes Relative: 5 %
Lymphs Abs: 0.5 10*3/uL — ABNORMAL LOW (ref 0.7–4.0)
MCH: 27.1 pg (ref 26.0–34.0)
MCHC: 30.5 g/dL (ref 30.0–36.0)
MCV: 88.9 fL (ref 80.0–100.0)
Monocytes Absolute: 0.2 10*3/uL (ref 0.1–1.0)
Monocytes Relative: 3 %
Neutro Abs: 8.2 10*3/uL — ABNORMAL HIGH (ref 1.7–7.7)
Neutrophils Relative %: 91 %
Platelets: 265 10*3/uL (ref 150–400)
RBC: 3.5 MIL/uL — ABNORMAL LOW (ref 3.87–5.11)
RDW: 22.9 % — ABNORMAL HIGH (ref 11.5–15.5)
WBC: 9 10*3/uL (ref 4.0–10.5)
nRBC: 0 % (ref 0.0–0.2)

## 2020-04-04 LAB — COOXEMETRY PANEL
Carboxyhemoglobin: 1.3 % (ref 0.5–1.5)
Methemoglobin: 0.9 % (ref 0.0–1.5)
O2 Saturation: 65.8 %
Total hemoglobin: 8.9 g/dL — ABNORMAL LOW (ref 12.0–16.0)

## 2020-04-04 LAB — GLUCOSE, CAPILLARY
Glucose-Capillary: 177 mg/dL — ABNORMAL HIGH (ref 70–99)
Glucose-Capillary: 148 mg/dL — ABNORMAL HIGH (ref 70–99)
Glucose-Capillary: 205 mg/dL — ABNORMAL HIGH (ref 70–99)
Glucose-Capillary: 198 mg/dL — ABNORMAL HIGH (ref 70–99)

## 2020-04-04 LAB — PHOSPHORUS: Phosphorus: 4.9 mg/dL — ABNORMAL HIGH (ref 2.5–4.6)

## 2020-04-04 MED ORDER — METOLAZONE 5 MG PO TABS
2.5000 mg | ORAL_TABLET | Freq: Once | ORAL | Status: AC
  Administered 2020-04-04: 2.5 mg via ORAL
  Filled 2020-04-04: qty 1

## 2020-04-04 MED ORDER — POTASSIUM CHLORIDE CRYS ER 20 MEQ PO TBCR: 30.0000 meq | EXTENDED_RELEASE_TABLET | Freq: Two times a day (BID) | ORAL | Status: DC

## 2020-04-04 NOTE — Progress Notes (Signed)
This chaplain attempted Pt. F/U spiritual care visit after talking to PMT NP-NPC and RN-Rachel.  Pt. eating lunch and talking on the phone. Pt invited chaplain to return at another time.

## 2020-04-04 NOTE — Progress Notes (Signed)
This chaplain returned to Pt. room for spiritual care visit as requested by Pt.  The Pt is awake and eating chocolate ice cream with her youngest sister.  The chaplain will F/U on Thursday.

## 2020-04-04 NOTE — Progress Notes (Signed)
[name] has a PICC line in.  She has on a infusion of Lasix.  This is to try to help draw fluid off of her.  I feel bad that she is still in the hospital because of cardiac issues.  Again she really has significant cardiac decompensation.  There is still issues with pain.  I think the pain can be treated with radiation therapy to the spine.  Unfortunately with her being in the hospital, this is can be very difficult to do.  We still have her on antiestrogen therapy.  She is on Aromasin.  She gets the Faslodex every month.  She does have the Lynch syndrome.  Because of this, we have her immunotherapy.  I know that she will respond to immunotherapy.  The issue right now clearly is her cardiac dysfunction.  I know that cardiology is working incredibly hard to try to get her stable so she can go home.  She did have an echocardiogram done on 30 August.  She had a left ventricular ejection fraction of 35-40%.  There is some global hypokinesis with the left ventricle.  She had severely dilated left atrium.  She had moderate tricuspid regurgitation.  Right ventricular function is also depressed.  Hopefully, she will be able to have good cardiac function with the Lasix infusion and also being on milrinone.  Hopefully her cardiac inotropic function will improve.  She still has very little with nausea.  There is no diarrhea.  I very much appreciate palliative care stopping by and talking with her yesterday.  She very much appreciated the attention by palliative care.  She enjoyed talking with Lexine Baton.  As always, with a very good prayer today.  Her faith remains very strong.  Lattie Haw, MD  Psalms 71: 3

## 2020-04-04 NOTE — Progress Notes (Signed)
PROGRESS NOTE  [name]  DOB: [birth year]  PCP: Patient, No Pcp Per [id number]  DOA: 03/07/2020  LOS: 17 days   Chief Complaint  Patient presents with  . Chest Pain    Brief narrative: [name] is a 74 y.o. 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 fib, ventricular tachycardia status post ICD in situ, breast cancer with bone mets, Lynch syndrome,  Patient presented to the ED on 03/24/2020 with complaint of shortness of breath, worsening redness on both upper legs with weeping lesions that started gradually after she used dermatome (exfoliative scrub) on her legs.    In the ED, patient was noted to be in A. fib with RVR to 120s, stable blood pressure.   Labs showed WBC count elevated to 24.5, lactic acid 2.2, sodium low at 129, potassium elevated to 5.4, BUN/creatinine 55/2.03, serum bicarb low at 15,  Chest x-ray showed cardiomegaly without overt failure. New mild to moderate compression deformity of mid to lower thoracic vertebra. Patient was admitted to hospital service for sepsis secondary to bilateral lower extremity cellulitis. Course was also complicated and prolonged by acute exacerbation of CHF.  Subjective: Patient was seen and examined this morning. Pleasant elderly Caucasian female.  Sitting up at the edge of the bed.  Not in distress. I remember taking care of her 2 weeks ago.  Assessment/Plan: Bilateral lower extremity cellulitis Sepsis -POA -resolved Immunocompromised status -Presented with worsening redness, pain, weeping lesions of both lower extremities. -Antibiotic course completed on 8/22. -Sepsis parameters improved.  WBC normalized.  Acute exacerbation of chronic combined CHF Essential hypertension -EF 35-40% with global hypokinesis. -Cardiology following. -Right heart cath this admission showed continued volume overload and low output. -Currently on IV Lasix drip and  milrinone infusion as well as metolazone. -Continue intake output, daily weight, blood pressure, renal function and electrolyte monitoring. -Continue Unna boots.  Acute kidney injury on CKD 3a -Baseline creatinine seems to be between 1-1.3 although with wide fluctuations noted in chart review. -Presented with an elevated creatinine of 2.03.  Creatinine being monitored while on Lasix drip.  Trend as below. Recent Labs    03/26/20 0225 03/27/20 0412 03/28/20 0310 03/29/20 0158 03/08/2020 2353 03/31/20 0708 04/01/20 0242 04/02/20 0153 04/03/20 0151 04/04/20 0500  CREATININE 1.44* 1.39* 1.62* 1.82* 1.94* 2.18* 2.18* 2.07* 2.02* 2.27*   A. fib with RVR H/o paroxysmal A. Fib s/p left atrial appendage clip History of V. Tach, ICD in situ.  -Presented with RVR, likely precipitated by sepsis. -Currently on amiodarone 200 mg daily and Eliquis 5 mg twice daily.  CAD s/p CABG HLD -On Eliquis. I do not see statin in the home list  Hyponatremia -Remains in the range of 130 to 135. Recent Labs  Lab 03/29/20 0158 03/11/2020 0658 03/21/2020 1427 03/31/20 0708 04/01/20 0242 04/02/20 0153 04/03/20 0151 04/04/20 0500  NA 133* 131* 135  139 135 133* 134* 133* 132*   Diabetes mellitus type 2 -A1c 7.2 on 8/15. Hypoglycemia -Glipizide currently on hold.  Continue sliding-scale insulin with Accu-Cheks. -Encouraged oral appetite.  Breast cancer with bone mets, Lynch syndrome Chronic pain -Breast cancer diagnosed in May.  Lynch syndrome diagnosed by genetic study.   -Follows up with Dr. Marin Olp as an outpatient.  Currently not on Ibrance.  Takes infusion periodically. -Chest x-ray on admission showed mild to moderate compression deformity of mid to lower thoracic vertebra. -Continue pain control with MS Contin. As needed  Dilaudid  Bladder prolapse -f/u with urology as an outpatient.  Goals of care -Palliative consulted.   Mobility: Encourage ambulation Code Status:   Code Status:  DNR  Nutritional status: Body mass index is 28.65 kg/m. Nutrition Problem: Moderate Malnutrition Etiology: chronic illness, cancer and cancer related treatments Signs/Symptoms: energy intake < 75% for > or equal to 1 month, mild fat depletion, moderate muscle depletion Diet Order            Diet regular Room service appropriate? Yes with Assist; Fluid consistency: Thin  Diet effective now                 DVT prophylaxis: Place TED hose Start: 03/19/20 1801 apixaban (ELIQUIS) tablet 5 mg   Antimicrobials:  None Fluid: None Consultants: Cardiology, hematology Family Communication:  None at bedside  Status is: Inpatient  Remains inpatient appropriate because:Ongoing diagnostic testing needed not appropriate for outpatient work up and IV treatments appropriate due to intensity of illness or inability to take PO   Dispo:  Patient From: Home  Planned Disposition: To be determined  Expected discharge date: 04/02/20  Medically stable for discharge: No        Infusions:  . furosemide (LASIX) infusion 15 mg/hr (04/04/20 0800)  . milrinone 0.125 mcg/kg/min (04/04/20 1303)  . ondansetron (ZOFRAN) IV      Scheduled Meds: . amiodarone  200 mg Oral Daily  . apixaban  5 mg Oral BID  . calcium carbonate  1 tablet Oral TID  . Chlorhexidine Gluconate Cloth  6 each Topical Daily  . exemestane  25 mg Oral QPC breakfast  . gabapentin  200 mg Oral TID  . insulin aspart  0-15 Units Subcutaneous TID AC & HS  . lactose free nutrition  237 mL Oral BID BM  . metoprolol succinate  25 mg Oral Daily  . midodrine  5 mg Oral BID WC  . mirtazapine  7.5 mg Oral QHS  . morphine  15 mg Oral Q8H  . multivitamin with minerals  1 tablet Oral Daily  . OLANZapine  5 mg Oral QHS  . polyethylene glycol  17 g Oral BID  . senna  2 tablet Oral BID  . sodium chloride flush  10-40 mL Intracatheter Q12H    Antimicrobials: Anti-infectives (From admission, onward)   Start     Dose/Rate Route  Frequency Ordered Stop   03/20/20 1430  cefTRIAXone (ROCEPHIN) 2 g in sodium chloride 0.9 % 100 mL IVPB        2 g 200 mL/hr over 30 Minutes Intravenous Every 24 hours 03/20/20 1345 03/25/20 1320   03/20/20 0600  vancomycin (VANCOREADY) IVPB 1250 mg/250 mL  Status:  Discontinued        1,250 mg 166.7 mL/hr over 90 Minutes Intravenous Every 48 hours 03/18/20 0230 03/20/20 1345   03/18/20 0330  vancomycin (VANCOREADY) IVPB 1500 mg/300 mL        1,500 mg 150 mL/hr over 120 Minutes Intravenous  Once 03/18/20 0230 03/18/20 0803   03/18/20 0330  meropenem (MERREM) 1 g in sodium chloride 0.9 % 100 mL IVPB  Status:  Discontinued        1 g 200 mL/hr over 30 Minutes Intravenous 2 times daily 03/18/20 0230 03/20/20 1345   03/18/20 0130  piperacillin-tazobactam (ZOSYN) IVPB 3.375 g  Status:  Discontinued        3.375 g 100 mL/hr over 30 Minutes Intravenous  Once 03/18/20 0115 03/18/20 0214      PRN meds:  acetaminophen **OR** acetaminophen, HYDROmorphone (DILAUDID) injection, hydrOXYzine, LORazepam, morphine, ondansetron **OR** ondansetron (ZOFRAN) IV, sodium chloride, sodium chloride flush   Objective: Vitals:   04/04/20 1122 04/04/20 1624  BP: (!) 129/93 (!) 104/54  Pulse:  92  Resp: 17 20  Temp:  98.4 F (36.9 C)  SpO2:  94%    Intake/Output Summary (Last 24 hours) at 04/04/2020 1648 Last data filed at 04/04/2020 0913 Gross per 24 hour  Intake --  Output 800 ml  Net -800 ml   Filed Weights   04/01/20 0621 04/02/20 0535 04/03/20 0400  Weight: 75.7 kg 75.3 kg 75.7 kg   Weight change:  Body mass index is 28.65 kg/m.   Physical Exam: General exam: Appears calm and comfortable.  Not in physical distress Skin: No rashes, lesions or ulcers. HEENT: Atraumatic, normocephalic, supple neck, no obvious bleeding Lungs: Clear to auscultation bilaterally CVS: Regular rate and rhythm, no murmur GI/Abd soft, nontender, nondistended, bowel sound present CNS: Alert, awake, oriented  x3 Psychiatry: Mood appropriate Extremities: Both legs in Unna boots  Data Review: I have personally reviewed the laboratory data and studies available.  Recent Labs  Lab 03/29/20 0158 03/23/2020 1427 04/02/20 0153 04/03/20 0151 04/04/20 0500  WBC 8.0  --  8.5 6.8 9.0  NEUTROABS  --   --  6.8 4.8 8.2*  HGB 9.9* 11.9*  11.2* 9.4* 9.4* 9.5*  HCT 33.3* 35.0*  33.0* 30.8* 31.2* 31.1*  MCV 89.8  --  89.8 89.7 88.9  PLT 296  --  279 253 265   Recent Labs  Lab 03/29/20 0158 03/06/2020 0658 03/31/20 0708 04/01/20 0242 04/02/20 0153 04/03/20 0151 04/04/20 0500  NA 133*   < > 135 133* 134* 133* 132*  K 4.1   < > 4.4 3.6 3.7 3.8 4.7  CL 96*   < > 97* 99 98 97* 95*  CO2 25   < > 24 24 23 23 23   GLUCOSE 68*   < > 96 154* 109* 74 144*  BUN 42*   < > 52* 53* 56* 61* 75*  CREATININE 1.82*   < > 2.18* 2.18* 2.07* 2.02* 2.27*  CALCIUM 6.8*   < > 6.8* 6.5* 6.7* 6.9* 6.8*  MG 2.1  --   --   --  2.1 2.1  --   PHOS  --   --   --   --  4.5 4.5 4.9*   < > = values in this interval not displayed.   Signed, Terrilee Croak, MD [facility] Pager: [phone] (Secure Chat preferred). 04/04/2020

## 2020-04-04 NOTE — Progress Notes (Addendum)
Advanced Heart Failure Rounding Note  PCP-Cardiologist: No primary care provider on file.    Patient Profile   74 y/o woman with recently diagnosed metastatic breast CA (undergoing XRT), CAD s/p CABG, DM2, systolic HF with EF 93-81% and PAF.   Admitted with sepsis and lactic acidosis due to severe LE cellulitis in setting of marked volume overload. Course complicated by AF with RVR.  [place] 8/27 RHC Findings:  RA = 17 (v = 23) RV = 49/12 PA = 49/24 (35) PCW = 20 (v = 28) Fick cardiac output/index = 3.8/2.1 Thermo CO/CI = 3.7/2.1 PVR = 4.0 WU FA sat = 98%  PA sat = 52%, 51% PaPI = 1.5  Assessment: 1. 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 CO-OX 66%. CVP 20.   Complaining of leg edema.   Objective:   Weight Range: 75.7 kg Body mass index is 28.65 kg/m.   Vital Signs:   Temp:  [97.5 F (36.4 C)-97.6 F (36.4 C)] 97.5 F (36.4 C) (08/31 2100) Pulse Rate:  [98-110] 98 (08/31 2100) Resp:  [15-20] 15 (08/31 2100) BP: (107-128)/(60-91) 115/66 (08/31 2100) SpO2:  [94 %-95 %] 94 % (08/31 2100) Last BM Date: 04/03/20  Weight change: Filed Weights   04/01/20 0621 04/02/20 0535 04/03/20 0400  Weight: 75.7 kg 75.3 kg 75.7 kg    Intake/Output:   Intake/Output Summary (Last 24 hours) at 04/04/2020 0916 Last data filed at 04/04/2020 0913 Gross per 24 hour  Intake 0 ml  Output 800 ml  Net -800 ml      Physical Exam  CVP 20  General:  Sitting on the side of the bed.  No resp difficulty HEENT: normal Neck: supple. JVP to jaw. Carotids 2+ bilat; no bruits. No lymphadenopathy or thryomegaly appreciated. Cor: PMI nondisplaced. Irregular rate & rhythm. No rubs, gallops or murmurs. Lungs: clear Abdomen: soft, nontender, nondistended. No hepatosplenomegaly. No bruits or masses. Good bowel sounds. Extremities: no cyanosis, clubbing, rash, R and LLE 2+ edema Neuro: alert & orientedx3,  cranial nerves grossly intact. moves all 4 extremities w/o difficulty. Affect pleasant   Telemetry   A fib 100s   Labs    CBC Recent Labs    04/02/20 0153 04/03/20 0151  WBC 8.5 6.8  NEUTROABS 6.8 4.8  HGB 9.4* 9.4*  HCT 30.8* 31.2*  MCV 89.8 89.7  PLT 279 017   Basic Metabolic Panel Recent Labs    04/02/20 0153 04/03/20 0151  NA 134* 133*  K 3.7 3.8  CL 98 97*  CO2 23 23  GLUCOSE 109* 74  BUN 56* 61*  CREATININE 2.07* 2.02*  CALCIUM 6.7* 6.9*  MG 2.1 2.1  PHOS 4.5 4.5   Liver Function Tests Recent Labs    04/02/20 0153 04/03/20 0151  AST 20 20  ALT 21 20  ALKPHOS 106 106  BILITOT 1.4* 1.8*  PROT 5.5* 5.4*  ALBUMIN 2.4* 2.3*   No results for input(s): LIPASE, AMYLASE in the last 72 hours. Cardiac Enzymes No results for input(s): CKTOTAL, CKMB, CKMBINDEX, TROPONINI in the last 72 hours.  BNP: BNP (last 3 results) Recent Labs    12/04/19 1324 03/18/20 0229  BNP 581.1* 912.0*    ProBNP (last 3 results) No results for input(s): PROBNP in the last 8760 hours.   D-Dimer No results for input(s): DDIMER in the last 72 hours. Hemoglobin A1C No results for input(s): HGBA1C in the last  72 hours. Fasting Lipid Panel No results for input(s): CHOL, HDL, LDLCALC, TRIG, CHOLHDL, LDLDIRECT in the last 72 hours. Thyroid Function Tests No results for input(s): TSH, T4TOTAL, T3FREE, THYROIDAB in the last 72 hours.  Invalid input(s): FREET3  Other results:   Imaging    DG CHEST PORT 1 VIEW  Result Date: 04/03/2020 CLINICAL DATA:  PICC placement EXAM: PORTABLE CHEST 1 VIEW COMPARISON:  03/09/2020 FINDINGS: Right arm PICC tip in the SVC at the azygos level. Previous median sternotomy and CABG. Pacemaker/AICD. Atrial appendage clip. Mild pulmonary venous hypertension but without frank edema, effusion, infiltrate or collapse. IMPRESSION: 1. Right arm PICC tip in the SVC at the azygos level. 2. Pulmonary venous hypertension without frank edema.  Electronically Signed   By: Nelson Chimes M.D.   On: 04/03/2020 21:25   [place] EKG SITE RITE  Result Date: 04/03/2020 If Site Rite image not attached, placement could not be confirmed due to current cardiac rhythm.    Medications:     Scheduled Medications: . amiodarone  200 mg Oral Daily  . apixaban  5 mg Oral BID  . calcium carbonate  1 tablet Oral TID  . Chlorhexidine Gluconate Cloth  6 each Topical Daily  . exemestane  25 mg Oral QPC breakfast  . gabapentin  200 mg Oral TID  . insulin aspart  0-15 Units Subcutaneous TID AC & HS  . lactose free nutrition  237 mL Oral BID BM  . metoprolol succinate  25 mg Oral Daily  . midodrine  5 mg Oral BID WC  . mirtazapine  7.5 mg Oral QHS  . morphine  15 mg Oral Q8H  . multivitamin with minerals  1 tablet Oral Daily  . OLANZapine  5 mg Oral QHS  . polyethylene glycol  17 g Oral BID  . senna  2 tablet Oral BID  . sodium chloride flush  10-40 mL Intracatheter Q12H    Infusions: . furosemide (LASIX) infusion 15 mg/hr (04/04/20 0800)  . milrinone 0.125 mcg/kg/min (04/04/20 7262)  . ondansetron (ZOFRAN) IV      PRN Medications: acetaminophen **OR** acetaminophen, HYDROmorphone (DILAUDID) injection, hydrOXYzine, LORazepam, morphine, ondansetron **OR** ondansetron (ZOFRAN) IV, sodium chloride, sodium chloride flush   Assessment/Plan    1. Atrial Fibrillation with RVR - S/p MAZE and LAA clipping at time of CABG in 11/2016. S/p DCCV in 09/2015 and more recently 12/09/2019. Noted to be back in atrial fibrillation at last office visit on 01/30/2020 and started on low dose PO Amiodarone. - Presented in atrial fibrillation with RVR in the setting of sepsis secondary to cellulitis of bilateral lower extremities. Difficult situation complicated by patient's intolerance to multiple cardiac meds.  - Not been able to tolerate IV Amiodarone due to severe nausea. - Not candidate for CCB or dig - continue to treat w/ low dose PO amio, 200 mg daily  (refuses  higher dose) - continue low dose  blocker, Toprol XL 25 mg daily   - Continue Eliquis 5mg  twice daily.  No bleeding - consider attempt at DCCV  Prior to d/c   2. Acute on Chronic Combined CHF - History of ischemic cardiomyopathy. PYP in the past negative for amyloid. - Most recent Echo in 12/2019 showed LVEF of 35-40% with severe asymmetric LVH, mild MR, moderate TR, and severely elevated PASP. - Chest x-ray showed cardiomegaly with mildly enlarged central pulmonary vessel but no overt edema. BNP 912 - Home Lasix held on admit and patient actually given some IV fluids on admission  due to sepsis/ AKI. Diuretics restarted 8/17.  - RHC this admit showed continued volume overload and low output. CI 2.1  - milrinone started, continues on 0.125. CO-OX 66% - CVP 20. Continue lasix drip and give dose of metolazone. BMET pending.  - no dig w/ AKI  - Continue unna boots   3. CAD  - S/p CABG x4 in 11/2016.  - High-sensitivity troponin minimally elevated and flat at 22 >> 24. Not consistent with ACS. Suspect demand ischemia in setting of sepsis and atrial fibrillation with RVR. - No s/s angina - No Aspirin with need for Eliquis.  - Continue statin.  4. 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 Had VF arrest in 11/2016 with appropriate ICD shock.  - No recurrent VT/VF - Had not been able to tolerate Amiodarone or beta-blocker in the past.  5. Sepsis Secondary to Bilateral Lower Extremity Cellulitis - initially placed on broad spectrum abx w/ vanc + meropenum. AF. Lactic acid normalized.  Blood cultures NGTD  - has completed course of abx, AF.  - Resolved  6. 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 - Diagnosed in 12/2019. Lynch syndrome diagnosed by genetic study.  - Follows with Dr. Marin Olp as outpatient. On immunotherapy   7. Hyperkalemia - required lokelma this admit  BMET pending.    8. Type 2 Diabetes Mellitus - Management per primary team.  9. Acute on  Chronic Kidney Disease - baseline SCr 1.0-1.30 range  - Creatinine 2.20 on admission, 2/2 sepsis and deceased PO intake with constant nausea and frequent vomiting - suspect component of cardiorenal syndrome as well - BMET pending.   Length of Stay: [place], NP  04/04/2020, 9:16 AM   Advanced Heart Failure Team Pager [phone] (M-F; [place])  Please contact [facility] for night-coverage after hours (4p -7a ) and weekends on [email or web address]   Patient seen and examined with the above-signed Advanced Practice Provider and/or Housestaff. I personally reviewed laboratory data, imaging studies and relevant notes. I independently examined the patient and formulated the important aspects of the plan. I have edited the note to reflect any of my changes or salient points. I have personally discussed the plan with the patient and/or family.  PICC line placed yesterday. CVP 20. Co-ox 66%. Now on lasix gtt. Now I/Os or weights charted so difficult to assess response  General:  Weak appearing. No resp difficulty HEENT: normal Neck: supple. JVP to ear  Carotids 2+ bilat; no bruits. No lymphadenopathy or thryomegaly appreciated. Cor: PMI nondisplaced. Irregular rate & rhythm. No rubs, gallops or murmurs. Lungs: clear Abdomen: soft, nontender, nondistended. No hepatosplenomegaly. No bruits or masses. Good bowel sounds. Extremities: no cyanosis, clubbing, rash, 3+ edema into thigh + Unna Neuro: alert & orientedx3, cranial nerves grossly intact. moves all 4 extremities w/o difficulty. Affect pleasant  She remains markedly volume overloaded. Cardiac output stable on milrinone. Now on lasix gtt. Hard to assess response. Will add metolazone. Need for closer monitoring of I/Os and weights discussed with nursing team Can increase lasix gtt as needed. Continue amio, metoprolol and Eliquis for AF.   Glori Bickers, MD  11:33 AM

## 2020-04-04 NOTE — Progress Notes (Signed)
PT Cancellation Note  Patient Details Name: [name] MRN: [id number] DOB: Nov 23, 1945   Cancelled Treatment:    Reason Eval/Treat Not Completed: Other (comment) - PT checked on pt x3 today. First attempt, pt declined to eat lunch. Second attempt, pt declined due to arrival of sister to room. Last attempt, pt sleeping soundly. PT to check back tomorrow.  [place] Pager [phone]  Office [phone]    [place] 04/04/2020, 3:33 PM

## 2020-04-04 DEATH — deceased

## 2020-04-05 ENCOUNTER — Inpatient Hospital Stay (HOSPITAL_COMMUNITY): Payer: PPO

## 2020-04-05 DIAGNOSIS — I429 Cardiomyopathy, unspecified: Secondary | ICD-10-CM

## 2020-04-05 DIAGNOSIS — I509 Heart failure, unspecified: Secondary | ICD-10-CM

## 2020-04-05 LAB — BASIC METABOLIC PANEL
Anion gap: 13 (ref 5–15)
BUN: 75 mg/dL — ABNORMAL HIGH (ref 8–23)
CO2: 24 mmol/L (ref 22–32)
Calcium: 7 mg/dL — ABNORMAL LOW (ref 8.9–10.3)
Chloride: 95 mmol/L — ABNORMAL LOW (ref 98–111)
Creatinine, Ser: 2.35 mg/dL — ABNORMAL HIGH (ref 0.44–1.00)
GFR calc Af Amer: 23 mL/min — ABNORMAL LOW (ref >60)
GFR calc non Af Amer: 20 mL/min — ABNORMAL LOW (ref >60)
Glucose, Bld: 149 mg/dL — ABNORMAL HIGH (ref 70–99)
Potassium: 4.8 mmol/L (ref 3.5–5.1)
Sodium: 132 mmol/L — ABNORMAL LOW (ref 135–145)

## 2020-04-05 LAB — COOXEMETRY PANEL
Carboxyhemoglobin: 1.9 % — ABNORMAL HIGH (ref 0.5–1.5)
Carboxyhemoglobin: 1.5 % (ref 0.5–1.5)
Methemoglobin: 1.3 % (ref 0.0–1.5)
Methemoglobin: 0.3 % (ref 0.0–1.5)
O2 Saturation: 95.1 %
O2 Saturation: 59.9 %
Total hemoglobin: 8.9 g/dL — ABNORMAL LOW (ref 12.0–16.0)
Total hemoglobin: 10 g/dL — ABNORMAL LOW (ref 12.0–16.0)

## 2020-04-05 LAB — MAGNESIUM: Magnesium: 2.1 mg/dL (ref 1.7–2.4)

## 2020-04-05 LAB — GLUCOSE, CAPILLARY
Glucose-Capillary: 130 mg/dL — ABNORMAL HIGH (ref 70–99)
Glucose-Capillary: 79 mg/dL (ref 70–99)
Glucose-Capillary: 141 mg/dL — ABNORMAL HIGH (ref 70–99)
Glucose-Capillary: 187 mg/dL — ABNORMAL HIGH (ref 70–99)

## 2020-04-05 MED ORDER — METOLAZONE 5 MG PO TABS
5.0000 mg | ORAL_TABLET | Freq: Two times a day (BID) | ORAL | Status: DC
  Administered 2020-04-05 – 2020-04-06 (×3): 5 mg via ORAL
  Filled 2020-04-05 (×4): qty 1

## 2020-04-05 NOTE — Progress Notes (Signed)
   04/05/20 1035  Clinical Encounter Type  Visited With Patient  Visit Type Follow-up;Spiritual support;Social support  Referral From Palliative care team  Consult/Referral To Chaplain  This chaplain was present today for F/U spiritual care.  The Pt. is awake and lying in bed. The Pt. greeted the chaplain and described today's fluid retention has left her feeling shaky and heavy. The chaplain listened as the Pt. reflected on yesterday's visit with her sister-Anna and the phone call with her daughter.  The chaplain understands the Pt. is looking forward to being medically appropriate to go home.  The Pt. shared her artistic interests with the chaplain. The Pt. agreed to the  chaplain bringing colored pencils and card stock to the room.  The chaplain is available for F/U and spiritual care as needed.

## 2020-04-05 NOTE — Progress Notes (Addendum)
Advanced Heart Failure Rounding Note  PCP-Cardiologist: No primary care provider on file.    Patient Profile   74 y/o woman with recently diagnosed metastatic breast CA (undergoing XRT), CAD s/p CABG, DM2, systolic HF with EF 32-20% and PAF.   Admitted with sepsis and lactic acidosis due to severe LE cellulitis in setting of marked volume overload. Course complicated by AF with RVR.  [place] 8/27 RHC Findings:  RA = 17 (v = 23) RV = 49/12 PA = 49/24 (35) PCW = 20 (v = 28) Fick cardiac output/index = 3.8/2.1 Thermo CO/CI = 3.7/2.1 PVR = 4.0 WU FA sat = 98%  PA sat = 52%, 51% PaPI = 1.5  Assessment: 1. 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  Co-ox not collected yet. CVP 16-18.   Only 1.4L in UOP charted yesterday, doubt entirely accurate (has had issues w/ incontinence)  SCr trending up, 2.02>>2.27>>2.35.  K 4.8  Remains in Afib w/ RVR in the 130s. BP stable.   She complains of wheezing this am but no profound dyspnea. Legs feel heavy.    Objective:   Weight Range: 79 kg Body mass index is 29.9 kg/m.   Vital Signs:   Temp:  [97.4 F (36.3 C)-98.4 F (36.9 C)] 98 F (36.7 C) (09/02 0715) Pulse Rate:  [82-121] 121 (09/02 0715) Resp:  [9-20] 17 (09/02 0715) BP: (96-132)/(54-99) 122/79 (09/02 0715) SpO2:  [94 %-96 %] 96 % (09/02 0715) Weight:  [79 kg] 79 kg (09/02 0439) Last BM Date: 03/16/20  Weight change: Filed Weights   04/02/20 0535 04/03/20 0400 04/05/20 0439  Weight: 75.3 kg 75.7 kg 79 kg    Intake/Output:   Intake/Output Summary (Last 24 hours) at 04/05/2020 0751 Last data filed at 04/04/2020 2032 Gross per 24 hour  Intake 240 ml  Output 1400 ml  Net -1160 ml      Physical Exam   CVP 16-18 General: chronically ill/ fatigue appearing, sitting up in bed. No resp difficulty HEENT: normal Neck: supple. JVP elevated to jaw. Carotids 2+  bilat; no bruits. No lymphadenopathy or thryomegaly appreciated. Cor: PMI nondisplaced. Irregularly irregular rhythm, tachy rate. No rubs, gallops or murmurs. Lungs: bilateral expiratory wheezing at the bases, otherwise clear  Abdomen: soft, nontender, nondistended. No hepatosplenomegaly. No bruits or masses. Good bowel sounds. Extremities: no cyanosis, clubbing, rash, R and LLE 2+ edema up to thighs, bilateral unna boots  Neuro: alert & orientedx3, cranial nerves grossly intact. moves all 4 extremities w/o difficulty. Affect pleasant   Telemetry   A fib  130s   Labs    CBC Recent Labs    04/03/20 0151 04/04/20 0500  WBC 6.8 9.0  NEUTROABS 4.8 8.2*  HGB 9.4* 9.5*  HCT 31.2* 31.1*  MCV 89.7 88.9  PLT 253 254   Basic Metabolic Panel Recent Labs    04/03/20 0151 04/03/20 0151 04/04/20 0500 04/05/20 0454  NA 133*   < > 132* 132*  K 3.8   < > 4.7 4.8  CL 97*   < > 95* 95*  CO2 23   < > 23 24  GLUCOSE 74   < > 144* 149*  BUN 61*   < > 75* 75*  CREATININE 2.02*   < > 2.27* 2.35*  CALCIUM 6.9*   < > 6.8* 7.0*  MG 2.1  --   --  2.1  PHOS 4.5  --  4.9*  --    < > = values in this interval not displayed.   Liver Function Tests Recent Labs    04/03/20 0151 04/04/20 0500  AST 20 22  ALT 20 23  ALKPHOS 106 112  BILITOT 1.8* 1.5*  PROT 5.4* 6.1*  ALBUMIN 2.3* 2.7*   No results for input(s): LIPASE, AMYLASE in the last 72 hours. Cardiac Enzymes No results for input(s): CKTOTAL, CKMB, CKMBINDEX, TROPONINI in the last 72 hours.  BNP: BNP (last 3 results) Recent Labs    12/04/19 1324 03/18/20 0229  BNP 581.1* 912.0*    ProBNP (last 3 results) No results for input(s): PROBNP in the last 8760 hours.   D-Dimer No results for input(s): DDIMER in the last 72 hours. Hemoglobin A1C No results for input(s): HGBA1C in the last 72 hours. Fasting Lipid Panel No results for input(s): CHOL, HDL, LDLCALC, TRIG, CHOLHDL, LDLDIRECT in the last 72 hours. Thyroid Function  Tests No results for input(s): TSH, T4TOTAL, T3FREE, THYROIDAB in the last 72 hours.  Invalid input(s): FREET3  Other results:   Imaging    No results found.   Medications:     Scheduled Medications: . amiodarone  200 mg Oral Daily  . apixaban  5 mg Oral BID  . calcium carbonate  1 tablet Oral TID  . Chlorhexidine Gluconate Cloth  6 each Topical Daily  . exemestane  25 mg Oral QPC breakfast  . gabapentin  200 mg Oral TID  . insulin aspart  0-15 Units Subcutaneous TID AC & HS  . lactose free nutrition  237 mL Oral BID BM  . metoprolol succinate  25 mg Oral Daily  . midodrine  5 mg Oral BID WC  . mirtazapine  7.5 mg Oral QHS  . morphine  15 mg Oral Q8H  . multivitamin with minerals  1 tablet Oral Daily  . OLANZapine  5 mg Oral QHS  . polyethylene glycol  17 g Oral BID  . senna  2 tablet Oral BID  . sodium chloride flush  10-40 mL Intracatheter Q12H    Infusions: . furosemide (LASIX) infusion 15 mg/hr (04/04/20 1947)  . milrinone 0.125 mcg/kg/min (04/04/20 1303)  . ondansetron (ZOFRAN) IV      PRN Medications: acetaminophen **OR** acetaminophen, HYDROmorphone (DILAUDID) injection, hydrOXYzine, LORazepam, morphine, ondansetron **OR** ondansetron (ZOFRAN) IV, sodium chloride, sodium chloride flush   Assessment/Plan    1. Atrial Fibrillation with RVR - S/p MAZE and LAA clipping at time of CABG in 11/2016. S/p DCCV in 09/2015 and more recently 12/09/2019. Noted to be back in atrial fibrillation at last office visit on 01/30/2020 and started on low dose PO Amiodarone. - Presented in atrial fibrillation with RVR in the setting of sepsis secondary to cellulitis of bilateral lower extremities. Difficult situation complicated by patient's intolerance to multiple cardiac meds.  - Not been able to tolerate IV Amiodarone due to severe nausea. - Not candidate for CCB or dig - V-rates in the 130s today, in the setting of persistent volume overload + milrinone - continue to treat w/  low dose PO amio, 200 mg daily  (refuses higher dose) - continue low dose  blocker, Toprol XL 25 mg daily   - Continue Eliquis 5mg  twice daily.  No bleeding - diuresing remains challenging, in the setting of Afib. Recommend DCCV in an attempt to convert to NSR   2. Acute on Chronic Combined CHF - History of ischemic cardiomyopathy. PYP in the past negative  for amyloid. - Most recent Echo in 12/2019 showed LVEF of 35-40% with severe asymmetric LVH, mild MR, moderate TR, and severely elevated PASP. - Chest x-ray showed cardiomegaly with mildly enlarged central pulmonary vessel but no overt edema. BNP 912 - Home Lasix held on admit and patient actually given some IV fluids on admission due to sepsis/ AKI. Diuretics restarted 8/17.  - RHC this admit showed continued volume overload and low output. CI 2.1  - milrinone started, continues on 0.125. CO-OX pending - CVP 16-18. Continue lasix drip. Hold metolazone given worsening SCr.  - no dig w/ AKI  - Continue unna boots  - diuresing remains challenging, in the setting of Afib. Recommend DCCV in an attempt to convert to NSR  3. CAD  - S/p CABG x4 in 11/2016.  - High-sensitivity troponin minimally elevated and flat at 22 >> 24. Not consistent with ACS. Suspect demand ischemia in setting of sepsis and atrial fibrillation with RVR. - No s/s angina - No Aspirin with need for Eliquis.  - Continue statin.  4. 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 Had VF arrest in 11/2016 with appropriate ICD shock.  - No recurrent VT/VF - Had not been able to tolerate Amiodarone or beta-blocker in the past.  5. Sepsis Secondary to Bilateral Lower Extremity Cellulitis - initially placed on broad spectrum abx w/ vanc + meropenum. AF. Lactic acid normalized.  Blood cultures NGTD  - has completed course of abx, AF.  - Resolved  6. 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 - Diagnosed in 12/2019. Lynch syndrome diagnosed by genetic study.    - Follows with Dr. Marin Olp as outpatient. On immunotherapy   7. Hyperkalemia - required lokelma this admit  - stable at 4.8 today   8. Type 2 Diabetes Mellitus - Management per primary team.  9. Acute on Chronic Kidney Disease - baseline SCr 1.0-1.30 range  - Creatinine 2.20 on admission, 2/2 sepsis and deceased PO intake with constant nausea and frequent vomiting - suspect component of cardiorenal syndrome as well - SCr trending back up, 2.35 today. Hold metolazone today    Length of Stay: [address], PA-C  04/05/2020, 7:51 AM   Advanced Heart Failure Team Pager [phone] (M-F; [place])  Please contact [facility] for night-coverage after hours (4p -7a ) and weekends on [email or web address]   Patient seen and examined with the above-signed Advanced Practice Provider and/or Housestaff. I personally reviewed laboratory data, imaging studies and relevant notes. I independently examined the patient and formulated the important aspects of the plan. I have edited the note to reflect any of my changes or salient points. I have personally discussed the plan with the patient and/or family.  She is much worse today despite aggressive therapy with milrinone and lasix gtt. CVP 30. Audible wheezing on exam and progressive renal failure  General:  Weak/ pale appearing. + audible wheezing HEENT: normal Neck: supple. JVP to ear. Carotids 2+ bilat; no bruits. No lymphadenopathy or thryomegaly appreciated. Cor: PMI nondisplaced. Irregular rate & rhythm. No rubs, gallops or murmurs. Lungs: expiratory wheezes Abdomen: soft, nontender, nondistended. No hepatosplenomegaly. No bruits or masses. Good bowel sounds. Extremities: no cyanosis, clubbing, rash, 3+ edema + UNNA Neuro: alert & orientedx3, cranial nerves grossly intact. moves all 4 extremities w/o difficulty. Affect pleasant  We had a long talk about her situations and progressive HF. I think she is nearing end-stage. We talked about  lack of response to therapy  and fact that she may soon need ICU care. She reiterated clearly that whil she wants to continue trying to improve she would not want aggressive measures like CPR, intubation or ICU care. If she deteriorates she would want to be made comfortable. Will increase lasix gtt to 30 and increase milrinone. Too unstable for attempt at DCCV and doubt it will help much at this point.   Will touch bast with Palliative Care team.   Glori Bickers, MD  2:09 PM

## 2020-04-05 NOTE — Progress Notes (Signed)
   Appears uncomfortable. SOB with minimal movement.   CVP is up to 30. A fib 120s.   Increase milrinone 0.25 mcg and lasix drip to 30 mg per hour. Give 5 mg metolazone twice a day.Stop Toprol (refused the last few days)   Dr Haroldine Laws discussed worsening heart failure. He discussed code status. Remains DNR.    Will ask Palliative Care to see her today. May be moving toward comfort if no improvement.   [place] NP-C  9:58 AM

## 2020-04-05 NOTE — Progress Notes (Signed)
Orthopedic Tech Progress Note Patient Details:  [name] [birth year] [id number]  Ortho Devices Type of Ortho Device: Haematologist Ortho Device/Splint Location: Bilateral Ortho Device/Splint Interventions: Application, Ordered   Post Interventions Patient Tolerated: Well Instructions Provided: Care of device   [name] A Kobe Ofallon 04/05/2020, 6:44 PM

## 2020-04-05 NOTE — Progress Notes (Signed)
Daily Progress Note   Patient Name: [name]       Date: 04/05/2020 DOB: 01/28/1946  Age: 74 y.o. MRN#: [id number] Attending Physician: Terrilee Croak, MD Primary Care Physician: Patient, No Pcp Per Admit Date: 03/22/2020  Reason for Consultation/Follow-up: Establishing goals of care, Non pain symptom management, Pain control and Psychosocial/spiritual support  Subjective: Patient finishing up from using the bathroom. Observed her ambulating from bedside commode to her bedside. Increased work of breathing with use of accessory muscles. Able to focus on her breathing once seating and slow respirations down. Allowed opportunity to focus on calming down before talking although she was eager to express how she was feeling.   She shares she is not having the best day and feels fatigued.  Continues to complain of some leg heaviness.  Bilateral lower extremities are wrapped.  Reviewed previous goals of care discussion.  Patient becomes tearful expressing she feels that her time is coming more to add in although she was hopeful that she would be able to have more treatment and more time with her family.  Therapeutic listening and support provided.  Discussed at length patient's current state of health and poor prognosis in regards to her heart failure.  She is able to verbalize her care and plan expressing the increase of Lasix and her milrinone.  We discussed limited options and the risk of increasing such medications which can have a reflection on her renal function.  She verbalized understanding.  We discussed at length her ability to safely pursue further oncological interventions with awareness that her heart is of main concern at this point.  Rashea verbalized understanding.  She is tearful and expressing she does not wish to suffer and if that means it is time for her to focus on end-of-life she is willing to be open to do that however she would like to at least allow herself another 24-48 hours  to see how she does given the adjustments of medications.  She shares her grandson came today and that she had a frozen coffee together and she gently approached the topic that she may not be doing as good as she expected.  She is tearful and expressing her uncertainty that he accepted the conversation.  Emotional support provided.  Kassidy requested to openly discuss what comfort/EOL care what look like for her when it is time for that decision to be made.  She is tearful and expressing she would not want to place her grandchildren in a situation to where they had to make that decision and is hopeful that if this is needed she can make the decisions and they will support her.  Education provided on comfort care while hospitalized in addition to outpatient hospice in the home and at a local hospice facility.  Patient expressed she does not feel like she has the appropriate care and support in the home although her desire would be to spend her last moments with her family at her house she worries that it may be too stressful for her grandchildren.  Therapeutic listening and support provided.  Education provided on residential hospice facility in their care with understanding her family would be allowed to be present with her there.  She verbalized understanding and expressed if she was not to the point that she would pass away in the hospital and met criteria once a decision is made to transition to comfort she would most likely wish to be placed at [facility] facility so that her  grandchildren and friends could at least visit with her during the time that she had left.   Domonic is requesting to continue with treatment and see how she does over the next 24-48hrs. She is aware that overall her prognosis is poor. She is willing to lean more towards comfort and not suffer. She is concerned for her grandchildren but knows she has to care for herself. She does feel that her pain is controlled with current regimen.    She ask that we continue to follow along and support her as she makes decisions over the next day or son. Unfortunately I will be off service after today returning on Sunday however, she is aware my colleague will support her during my time-off.   Length of Stay: 18 days  Vital Signs: BP 129/76 (BP Location: Left Arm)   Pulse (!) 126   Temp 97.9 F (36.6 C) (Oral)   Resp 15   Ht 5' 4"  (1.626 m)   Wt 79 kg   SpO2 96%   BMI 29.90 kg/m  SpO2: SpO2: 96 % O2 Device: O2 Device: Room Air O2 Flow Rate: O2 Flow Rate (L/min): 0 L/min  Physical Exam: -alert and oriented x3, chronically-ill appearing -some shortness of breath with exertion -tachycardic -scattered bruising, bilateral leg wraps -mood appropriate              Palliative Care Assessment & Plan    Code Status:  DNR  Goals of Care/Recommendations:  Confirms DNR/DNI, would not want to be in ICU   Continue current plan of care per medical team  Meghna is hopeful for some stability, however is very aware of her poor prognosis and approaching of end-stage heart failure. She understands this is even of more concern in addition to her metastatic cancer. She is clear she does not wish to suffer and is open to comfort/EOL if no further improvement in the next 24-48hrs or her condition worsening knowing no care is currently maximized. Would not be a safe or appropriate plan for hospice at home, would lean more to comfort with anticipated hospital death vs. Residential hospice facility.  Pain is controlled and manageable under current regimen.    PMT will continue to support and follow. I will be off-service after today returning on Sunday. My Palliative colleague Jinny Blossom will cover Friday and Sat.   Prognosis: Poor   Discharge Planning: To Be Determined  Thank you for allowing the Palliative Medicine Team to assist in the care of this patient.  Time Total: 50 min.   Visit consisted of counseling and education dealing with  the complex and emotionally intense issues of symptom management and palliative care in the setting of serious and potentially life-threatening illness.Greater than 50%  of this time was spent counseling and coordinating care related to the above assessment and plan.  Alda Lea, AGPCNP-BC  Palliative Medicine Team [phone]

## 2020-04-05 NOTE — Progress Notes (Signed)
Physical Therapy Treatment Patient Details Name: [name] MRN: [id number] DOB: 07/29/1946 Today's Date: 04/05/2020    History of Present Illness [name] is a 74 y.o. 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 fib, ventricular tachycardia status post ICD in situ, breast cancer with bone mets, Lynch syndrome, Patient presented to the ED on 04/03/2020 with complaint of shortness of breath, worsening redness on both upper legs with weeping lesions.  Pt admitted with sepsis, cellulitis, CHF, and afib.    PT Comments    Pt making gradual progress. She was able to perform sit to stand and ambulation 12'x2 with RW and min A.  Pt requiring min A for balance and is fall risk.  Pt had elevated HR but otherwise VSS.  Pt with decreased safety awareness - feels that she can go home without therapy to progress and without 24 hr support.  Ideally, therapy would recommend SNF but pt has refused.  Pt at least needs 24 hr supervision for safety and strongly recommend HHPT if pt would agree (she is not at this time).      Follow Up Recommendations  Supervision/Assistance - 24 hour;Home health PT (HHPT if pt agrees)     Equipment Recommendations  3in1 (PT)    Recommendations for Other Services       Precautions / Restrictions Precautions Precautions: Fall Precaution Comments: unna boots    Mobility  Bed Mobility Overal bed mobility: Modified Independent Bed Mobility: Supine to Sit   Sidelying to sit: Modified independent (Device/Increase time);HOB elevated       General bed mobility comments: HOB elevated and use of bed rail - reports getting a rail for bed at home  Transfers Overall transfer level: Needs assistance Equipment used: Rolling walker (2 wheeled) Transfers: Sit to/from Stand Sit to Stand: Min assist         General transfer comment: Min guard to stand from elevated bed and min A to boost up from  chair.  REquired cues for hand placement when in chair.  Pt stating "well how will I do that if I'm by myself" - discussed recommendations for 24 hr care  Ambulation/Gait Ambulation/Gait assistance: Min assist Gait Distance (Feet): 12 Feet (12'x2) Assistive device: Rolling walker (2 wheeled) Gait Pattern/deviations: Trunk flexed;Decreased stride length;Shuffle;Drifts right/left Gait velocity: decreased   General Gait Details: Pt unsteady requiring min A to balance, cues for RW and posture.  Fatigued easy, 1 seated rest break   Stairs             Wheelchair Mobility    Modified Rankin (Stroke Patients Only)       Balance Overall balance assessment: Needs assistance Sitting-balance support: Feet supported;No upper extremity supported Sitting balance-Leahy Scale: Good Sitting balance - Comments: Pt wanting to stay seated EOB ; demonstrates good balance   Standing balance support: Bilateral upper extremity supported Standing balance-Leahy Scale: Poor Standing balance comment: Requiring RW and min A for balance with walking                            Cognition Arousal/Alertness: Awake/alert Behavior During Therapy: WFL for tasks assessed/performed Overall Cognitive Status: Impaired/Different from baseline Area of Impairment: Safety/judgement;Problem solving                         Safety/Judgement: Decreased awareness of safety;Decreased awareness of deficits  Problem Solving: Slow processing;Difficulty sequencing;Requires verbal cues;Requires tactile cues        Exercises General Exercises - Lower Extremity Ankle Circles/Pumps: AROM;Both;10 reps;Supine Quad Sets: AROM;Both;10 reps;Supine Gluteal Sets: AROM;Both;10 reps;Supine Long Arc Quad: AROM;Both;10 reps;Supine Heel Slides: AROM;Both;Supine;15 reps Hip ABduction/ADduction: AROM;Both;10 reps;Supine Straight Leg Raises: AROM;Both;5 reps;Supine    General Comments General comments (skin  integrity, edema, etc.): HR 102-124 bpm, other VSS.   Pt has declined all services after d/c offered by PT in the past.  Discussed fall risk and need for assistance at home when pt stated "how will I do this at home."   Discussed PT strongly recommends 24 hr support and HHPT.  Pt becoming a bit emotiional stating "I did it before, I've got palliative services coming, I don't want all of the people coming to my house."      Pertinent Vitals/Pain Pain Assessment: No/denies pain    Home Living                      Prior Function            PT Goals (current goals can now be found in the care plan section) Acute Rehab PT Goals Patient Stated Goal: feel better PT Goal Formulation: With patient Time For Goal Achievement: 04/17/20 Potential to Achieve Goals: Fair Progress towards PT goals: Progressing toward goals    Frequency    Min 3X/week      PT Plan Current plan remains appropriate    Co-evaluation              AM-PAC PT "6 Clicks" Mobility   Outcome Measure  Help needed turning from your back to your side while in a flat bed without using bedrails?: None Help needed moving from lying on your back to sitting on the side of a flat bed without using bedrails?: A Little Help needed moving to and from a bed to a chair (including a wheelchair)?: A Little Help needed standing up from a chair using your arms (e.g., wheelchair or bedside chair)?: A Little Help needed to walk in hospital room?: A Little Help needed climbing 3-5 steps with a railing? : A Lot 6 Click Score: 18    End of Session Equipment Utilized During Treatment: Gait belt Activity Tolerance: Patient tolerated treatment well;Patient limited by fatigue Patient left: in bed;with call bell/phone within reach;with bed alarm set (sitting EOB) Nurse Communication: Mobility status PT Visit Diagnosis: Unsteadiness on feet (R26.81);Difficulty in walking, not elsewhere classified (R26.2);Pain Pain -  Right/Left: Right     Time: 2671-2458 PT Time Calculation (min) (ACUTE ONLY): 30 min  Charges:  $Gait Training: 8-22 mins $Therapeutic Exercise: 8-22 mins                     Abran Richard, PT Acute Rehab Services Pager [phone] [facility] 04/05/2020, 11:14 AM

## 2020-04-05 NOTE — Plan of Care (Signed)
Continue to monitor

## 2020-04-05 NOTE — Progress Notes (Signed)
PROGRESS NOTE  [name]  DOB: Jun 05, 1946  PCP: Patient, No Pcp Per NGE:[id number]  DOA: 03/20/2020  LOS: 18 days   Chief Complaint  Patient presents with  . Chest Pain    Brief narrative: [name] is a 74 y.o. 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 fib, ventricular tachycardia status post ICD in situ, breast cancer with bone mets, Lynch syndrome,  Patient presented to the ED on 04/03/2020 with complaint of shortness of breath, worsening redness on both upper legs with weeping lesions that started gradually after she used dermatome (exfoliative scrub) on her legs.    In the ED, patient was noted to be in A. fib with RVR to 120s, stable blood pressure.   Labs showed WBC count elevated to 24.5, lactic acid 2.2, sodium low at 129, potassium elevated to 5.4, BUN/creatinine 55/2.03, serum bicarb low at 15,  Chest x-ray showed cardiomegaly without overt failure. New mild to moderate compression deformity of mid to lower thoracic vertebra. Patient was admitted to hospital service for sepsis secondary to bilateral lower extremity cellulitis. Course was also complicated and prolonged by acute exacerbation of CHF.  Subjective: Patient was seen and examined this morning. Feels worse today. Feels short of breath. Tachycardic. Chest x-ray with no pulmonary edema  Assessment/Plan: Bilateral lower extremity cellulitis Sepsis -POA -resolved Immunocompromised status -Presented with worsening redness, pain, weeping lesions of both lower extremities. -Antibiotic course completed on 8/22. -Sepsis parameters improved.  WBC normalized.  Acute exacerbation of chronic combined CHF Essential hypertension -EF 35-40% with global hypokinesis. -Cardiology following. -Right heart cath this admission showed continued volume overload and low output. -Currently on IV Lasix drip and milrinone infusion.  Noted cardiology stop  metolazone today. -Continue intake output, daily weight, blood pressure, renal function and electrolyte monitoring. -Continue Unna boots.  Acute kidney injury on CKD 3a -Baseline creatinine seems to be between 1-1.3 although with wide fluctuations noted in chart review. -Presented with an elevated creatinine of 2.03.  Creatinine being monitored while on Lasix drip.  Trend as below. Recent Labs    03/27/20 0412 03/28/20 0310 03/29/20 0158 03/27/2020 4010 03/31/20 0708 04/01/20 0242 04/02/20 0153 04/03/20 0151 04/04/20 0500 04/05/20 0454  CREATININE 1.39* 1.62* 1.82* 1.94* 2.18* 2.18* 2.07* 2.02* 2.27* 2.35*   A. fib with RVR H/o paroxysmal A. Fib s/p left atrial appendage clip History of V. Tach, ICD in situ.  -Presented with RVR, likely precipitated by sepsis. -Currently on amiodarone 200 mg daily and Eliquis 5 mg twice daily.  CAD s/p CABG HLD -On Eliquis.  Not on statin.  Hyponatremia -Remains in the range of 130 to 135. Recent Labs  Lab 03/21/2020 0658 03/15/2020 1427 03/31/20 0708 04/01/20 0242 04/02/20 0153 04/03/20 0151 04/04/20 0500 04/05/20 0454  NA 131* 135  139 135 133* 134* 133* 132* 132*   Diabetes mellitus type 2 -A1c 7.2 on 8/15. Hypoglycemia -Glipizide currently on hold.  Continue sliding-scale insulin with Accu-Cheks. -Encouraged oral appetite.  Breast cancer with bone mets, Lynch syndrome Chronic pain -Breast cancer diagnosed in May.  Lynch syndrome diagnosed by genetic study.   -Follows up with Dr. Marin Olp as an outpatient.  Currently not on Ibrance.  Takes infusion periodically. -Chest x-ray on admission showed mild to moderate compression deformity of mid to lower thoracic vertebra. -Continue pain control with MS Contin. As needed Dilaudid  Bladder prolapse -f/u with urology as an outpatient.  Goals of care -Palliative  consulted.   Mobility: Encourage ambulation Code Status:   Code Status: DNR  Nutritional status: Body mass index  is 29.9 kg/m. Nutrition Problem: Moderate Malnutrition Etiology: chronic illness, cancer and cancer related treatments Signs/Symptoms: energy intake < 75% for > or equal to 1 month, mild fat depletion, moderate muscle depletion Diet Order            Diet regular Room service appropriate? Yes with Assist; Fluid consistency: Thin  Diet effective now                 DVT prophylaxis: Place TED hose Start: 03/19/20 1801 apixaban (ELIQUIS) tablet 5 mg   Antimicrobials:  None Fluid: None Consultants: Cardiology, hematology Family Communication:  None at bedside  Status is: Inpatient  Remains inpatient appropriate because remains on Lasix, milrinone drip.  Clinical not improving.  Palliative care consultation was obtained.    Dispo:  Patient From: Home  Planned Disposition: To be determined  Expected discharge date: 04/02/20  Medically stable for discharge: No  Infusions:  . furosemide (LASIX) infusion 30 mg/hr (04/05/20 1244)  . milrinone 0.25 mcg/kg/min (04/05/20 1045)  . ondansetron (ZOFRAN) IV      Scheduled Meds: . amiodarone  200 mg Oral Daily  . apixaban  5 mg Oral BID  . calcium carbonate  1 tablet Oral TID  . Chlorhexidine Gluconate Cloth  6 each Topical Daily  . exemestane  25 mg Oral QPC breakfast  . gabapentin  200 mg Oral TID  . insulin aspart  0-15 Units Subcutaneous TID AC & HS  . lactose free nutrition  237 mL Oral BID BM  . metolazone  5 mg Oral BID  . midodrine  5 mg Oral BID WC  . mirtazapine  7.5 mg Oral QHS  . morphine  15 mg Oral Q8H  . multivitamin with minerals  1 tablet Oral Daily  . OLANZapine  5 mg Oral QHS  . polyethylene glycol  17 g Oral BID  . senna  2 tablet Oral BID  . sodium chloride flush  10-40 mL Intracatheter Q12H    Antimicrobials: Anti-infectives (From admission, onward)   Start     Dose/Rate Route Frequency Ordered Stop   03/20/20 1430  cefTRIAXone (ROCEPHIN) 2 g in sodium chloride 0.9 % 100 mL IVPB        2 g 200 mL/hr  over 30 Minutes Intravenous Every 24 hours 03/20/20 1345 03/25/20 1320   03/20/20 0600  vancomycin (VANCOREADY) IVPB 1250 mg/250 mL  Status:  Discontinued        1,250 mg 166.7 mL/hr over 90 Minutes Intravenous Every 48 hours 03/18/20 0230 03/20/20 1345   03/18/20 0330  vancomycin (VANCOREADY) IVPB 1500 mg/300 mL        1,500 mg 150 mL/hr over 120 Minutes Intravenous  Once 03/18/20 0230 03/18/20 0803   03/18/20 0330  meropenem (MERREM) 1 g in sodium chloride 0.9 % 100 mL IVPB  Status:  Discontinued        1 g 200 mL/hr over 30 Minutes Intravenous 2 times daily 03/18/20 0230 03/20/20 1345   03/18/20 0130  piperacillin-tazobactam (ZOSYN) IVPB 3.375 g  Status:  Discontinued        3.375 g 100 mL/hr over 30 Minutes Intravenous  Once 03/18/20 0115 03/18/20 0214      PRN meds: acetaminophen **OR** acetaminophen, HYDROmorphone (DILAUDID) injection, hydrOXYzine, LORazepam, morphine, ondansetron **OR** ondansetron (ZOFRAN) IV, sodium chloride, sodium chloride flush   Objective: Vitals:   04/05/20 0850 04/05/20 1147  BP: (!) 132/106 129/76  Pulse: (!) 110 (!) 126  Resp: 15 15  Temp:  97.9 F (36.6 C)  SpO2: 95% 96%    Intake/Output Summary (Last 24 hours) at 04/05/2020 1444 Last data filed at 04/04/2020 2032 Gross per 24 hour  Intake 240 ml  Output 600 ml  Net -360 ml   Filed Weights   04/02/20 0535 04/03/20 0400 04/05/20 0439  Weight: 75.3 kg 75.7 kg 79 kg   Weight change:  Body mass index is 29.9 kg/m.   Physical Exam: General exam: Feels weak. Skin: No rashes, lesions or ulcers. HEENT: Atraumatic, normocephalic, supple neck, no obvious bleeding Lungs: Mild scattered rales CVS: Regular rate and rhythm, no murmur GI/Abd soft, nontender, nondistended, bowel sound present CNS: Alert, awake, oriented x3 Psychiatry: Anxious today Extremities: Both legs in Unna boots  Data Review: I have personally reviewed the laboratory data and studies available.  Recent Labs  Lab  03/28/2020 1427 04/02/20 0153 04/03/20 0151 04/04/20 0500  WBC  --  8.5 6.8 9.0  NEUTROABS  --  6.8 4.8 8.2*  HGB 11.9*  11.2* 9.4* 9.4* 9.5*  HCT 35.0*  33.0* 30.8* 31.2* 31.1*  MCV  --  89.8 89.7 88.9  PLT  --  279 253 265   Recent Labs  Lab 04/01/20 0242 04/02/20 0153 04/03/20 0151 04/04/20 0500 04/05/20 0454  NA 133* 134* 133* 132* 132*  K 3.6 3.7 3.8 4.7 4.8  CL 99 98 97* 95* 95*  CO2 24 23 23 23 24   GLUCOSE 154* 109* 74 144* 149*  BUN 53* 56* 61* 75* 75*  CREATININE 2.18* 2.07* 2.02* 2.27* 2.35*  CALCIUM 6.5* 6.7* 6.9* 6.8* 7.0*  MG  --  2.1 2.1  --  2.1  PHOS  --  4.5 4.5 4.9*  --    Signed, Terrilee Croak, MD [facility] Pager: [phone] (Secure Chat preferred). 04/05/2020

## 2020-04-05 NOTE — Progress Notes (Signed)
Unfortunately, there has been little bit of a change in status with [name].  She seems to be more confused today.  She is talkative.  She has episodes of clarity.  She has a little bit of difficulty finding the right things to say.  She clearly is wheezing.  I suspect she might have volume overload.  I know that she is on milrinone and Lasix infusions.  We will get a chest x-ray on her.  There is no complaint of cough.  There is no complaint of increased shortness of breath.  Again, she has seems a little bit disoriented.  Her labs show a BUN of 75 creatinine 2.35.  Calcium is 7.  Sodium 132 with a potassium of 4.8.  She has a PICC line in.  It is hard to say how much she really ate yesterday.  I do not think there was any nausea or vomiting.  There is no obvious bleeding.  Her legs do not look as swollen.  She has wrappings on her legs.  It does not look like there is a lot of issues with pain.  Again some of this may be from her having confusion.  She got only 1 dose of Decadron yesterday.  I know this can sometimes cause some disorientation.  Her vital signs are temperature 97.9.  Pulse 99.  Blood pressure 127/76.  Oxygen saturation on room air is 95%.  Her lungs show wheezing bilaterally.  She has decent air movement bilaterally.  Cardiac exam regular rate and rhythm.  Her rate is relatively well controlled.  Abdomen is soft.  Neurological exam does not show any obvious focal neurological deficits.  I think she needs to have a chest x-ray today.  We need to see if she has volume overload.  This certainly could be possible given her by ventricular heart failure.  I would not think this is from the breast cancer.  However, we cannot be totally discounting of this possibility.  She may need to have some breathing treatments for the wheezing.  I am sure that cardiology is on top of all of this.  I know that Ms. Kroenke is in very good hands by all the staff up on [address].  Lattie Haw,  MD  Psalms 55:16

## 2020-04-06 ENCOUNTER — Inpatient Hospital Stay (HOSPITAL_COMMUNITY): Payer: PPO

## 2020-04-06 DIAGNOSIS — R0600 Dyspnea, unspecified: Secondary | ICD-10-CM

## 2020-04-06 LAB — CBC WITH DIFFERENTIAL/PLATELET
Abs Immature Granulocytes: 0.1 10*3/uL — ABNORMAL HIGH (ref 0.00–0.07)
Basophils Absolute: 0.1 10*3/uL (ref 0.0–0.1)
Basophils Relative: 1 %
Eosinophils Absolute: 0.8 10*3/uL — ABNORMAL HIGH (ref 0.0–0.5)
Eosinophils Relative: 8 %
HCT: 31.8 % — ABNORMAL LOW (ref 36.0–46.0)
Hemoglobin: 9.8 g/dL — ABNORMAL LOW (ref 12.0–15.0)
Immature Granulocytes: 1 %
Lymphocytes Relative: 6 %
Lymphs Abs: 0.6 10*3/uL — ABNORMAL LOW (ref 0.7–4.0)
MCH: 27.2 pg (ref 26.0–34.0)
MCHC: 30.8 g/dL (ref 30.0–36.0)
MCV: 88.3 fL (ref 80.0–100.0)
Monocytes Absolute: 0.8 10*3/uL (ref 0.1–1.0)
Monocytes Relative: 8 %
Neutro Abs: 7.6 10*3/uL (ref 1.7–7.7)
Neutrophils Relative %: 76 %
Platelets: 287 10*3/uL (ref 150–400)
RBC: 3.6 MIL/uL — ABNORMAL LOW (ref 3.87–5.11)
RDW: 23.1 % — ABNORMAL HIGH (ref 11.5–15.5)
WBC: 10 10*3/uL (ref 4.0–10.5)
nRBC: 0.3 % — ABNORMAL HIGH (ref 0.0–0.2)

## 2020-04-06 LAB — BASIC METABOLIC PANEL
Anion gap: 15 (ref 5–15)
BUN: 81 mg/dL — ABNORMAL HIGH (ref 8–23)
CO2: 23 mmol/L (ref 22–32)
Calcium: 7 mg/dL — ABNORMAL LOW (ref 8.9–10.3)
Chloride: 93 mmol/L — ABNORMAL LOW (ref 98–111)
Creatinine, Ser: 2.59 mg/dL — ABNORMAL HIGH (ref 0.44–1.00)
GFR calc Af Amer: 21 mL/min — ABNORMAL LOW (ref >60)
GFR calc non Af Amer: 18 mL/min — ABNORMAL LOW (ref >60)
Glucose, Bld: 143 mg/dL — ABNORMAL HIGH (ref 70–99)
Potassium: 5 mmol/L (ref 3.5–5.1)
Sodium: 131 mmol/L — ABNORMAL LOW (ref 135–145)

## 2020-04-06 LAB — COMPREHENSIVE METABOLIC PANEL
ALT: 25 U/L (ref 0–44)
AST: 24 U/L (ref 15–41)
Albumin: 2.8 g/dL — ABNORMAL LOW (ref 3.5–5.0)
Alkaline Phosphatase: 109 U/L (ref 38–126)
Anion gap: 15 (ref 5–15)
BUN: 80 mg/dL — ABNORMAL HIGH (ref 8–23)
CO2: 23 mmol/L (ref 22–32)
Calcium: 7 mg/dL — ABNORMAL LOW (ref 8.9–10.3)
Chloride: 92 mmol/L — ABNORMAL LOW (ref 98–111)
Creatinine, Ser: 2.68 mg/dL — ABNORMAL HIGH (ref 0.44–1.00)
GFR calc Af Amer: 20 mL/min — ABNORMAL LOW (ref >60)
GFR calc non Af Amer: 17 mL/min — ABNORMAL LOW (ref >60)
Glucose, Bld: 91 mg/dL (ref 70–99)
Potassium: 4.9 mmol/L (ref 3.5–5.1)
Sodium: 130 mmol/L — ABNORMAL LOW (ref 135–145)
Total Bilirubin: 1.6 mg/dL — ABNORMAL HIGH (ref 0.3–1.2)
Total Protein: 6.3 g/dL — ABNORMAL LOW (ref 6.5–8.1)

## 2020-04-06 LAB — COOXEMETRY PANEL
Carboxyhemoglobin: 1.8 % — ABNORMAL HIGH (ref 0.5–1.5)
Methemoglobin: 1.4 % (ref 0.0–1.5)
O2 Saturation: 64.4 %
Total hemoglobin: 9.8 g/dL — ABNORMAL LOW (ref 12.0–16.0)

## 2020-04-06 LAB — AMMONIA: Ammonia: 45 umol/L — ABNORMAL HIGH (ref 9–35)

## 2020-04-06 LAB — GLUCOSE, CAPILLARY
Glucose-Capillary: 96 mg/dL (ref 70–99)
Glucose-Capillary: 103 mg/dL — ABNORMAL HIGH (ref 70–99)

## 2020-04-06 LAB — PREALBUMIN: Prealbumin: 15.6 mg/dL — ABNORMAL LOW (ref 18–38)

## 2020-04-06 LAB — MAGNESIUM: Magnesium: 1.9 mg/dL (ref 1.7–2.4)

## 2020-04-06 MED ORDER — GLYCOPYRROLATE 0.2 MG/ML IJ SOLN
0.2000 mg | INTRAMUSCULAR | Status: DC | PRN
  Administered 2020-04-06 – 2020-04-15 (×2): 0.2 mg via INTRAVENOUS
  Filled 2020-04-06 (×2): qty 1

## 2020-04-06 MED ORDER — HYDROMORPHONE BOLUS VIA INFUSION
1.0000 mg | INTRAVENOUS | Status: DC | PRN
Start: 2020-04-06 — End: 2020-04-06

## 2020-04-06 MED ORDER — HYDROMORPHONE HCL 1 MG/ML IJ SOLN
1.0000 mg | INTRAMUSCULAR | Status: DC | PRN
  Administered 2020-04-07: 1 mg via INTRAVENOUS
  Filled 2020-04-06 (×2): qty 1

## 2020-04-06 MED ORDER — GLYCOPYRROLATE 0.2 MG/ML IJ SOLN
0.2000 mg | Freq: Four times a day (QID) | INTRAMUSCULAR | Status: DC
  Administered 2020-04-06 – 2020-04-15 (×34): 0.2 mg via INTRAVENOUS
  Filled 2020-04-06 (×32): qty 1

## 2020-04-06 MED ORDER — MIDODRINE HCL 5 MG PO TABS: 5.0000 mg | ORAL_TABLET | Freq: Two times a day (BID) | ORAL | Status: DC

## 2020-04-06 MED ORDER — FUROSEMIDE 10 MG/ML IJ SOLN
30.0000 mg/h | INTRAVENOUS | Status: DC
  Filled 2020-04-06: qty 25

## 2020-04-06 MED ORDER — DIPHENHYDRAMINE HCL 50 MG/ML IJ SOLN: 25.0000 mg | Freq: Three times a day (TID) | INTRAMUSCULAR | Status: DC | PRN

## 2020-04-06 MED ORDER — LORAZEPAM 2 MG/ML IJ SOLN
0.5000 mg | INTRAMUSCULAR | Status: DC | PRN
  Administered 2020-04-07 – 2020-04-14 (×9): 0.5 mg via INTRAVENOUS
  Filled 2020-04-06 (×9): qty 1

## 2020-04-06 MED ORDER — SODIUM CHLORIDE 0.9 % IV SOLN
2.0000 mg/h | INTRAVENOUS | Status: DC
  Administered 2020-04-06: 0.5 mg/h via INTRAVENOUS
  Administered 2020-04-07 – 2020-04-08 (×3): 2 mg/h via INTRAVENOUS
  Administered 2020-04-08 – 2020-04-09 (×4): 3 mg/h via INTRAVENOUS
  Filled 2020-04-06 (×13): qty 2.5

## 2020-04-06 MED ORDER — SENNA 8.6 MG PO TABS: 2.0000 | ORAL_TABLET | Freq: Every day | ORAL | Status: DC | PRN

## 2020-04-06 MED ORDER — POLYETHYLENE GLYCOL 3350 17 G PO PACK: 17.0000 g | PACK | Freq: Every day | ORAL | Status: DC | PRN

## 2020-04-06 MED ORDER — HYDROMORPHONE BOLUS VIA INFUSION
0.5000 mg | INTRAVENOUS | Status: DC | PRN
  Administered 2020-04-06: 0.5 mg via INTRAVENOUS
  Filled 2020-04-06: qty 1

## 2020-04-06 NOTE — Progress Notes (Signed)
[employer] contacted and informed of pt now comfort care. On call person was notified about the need to tune off ICD.   Phoebe Sharps, RN

## 2020-04-06 NOTE — Progress Notes (Signed)
PROGRESS NOTE  [name]  DOB: [birth year]  PCP: Patient, No Pcp Per UYQ:[id number]  DOA: 03/30/2020  LOS: 19 days   Chief Complaint  Patient presents with  . Chest Pain    Brief narrative: [name] is a 74 y.o. 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 fib, ventricular tachycardia status post ICD in situ, breast cancer with bone mets, Lynch syndrome,  Patient presented to the ED on 03/16/2020 with complaint of shortness of breath, worsening redness on both upper legs with weeping lesions that started gradually after she used dermatome (exfoliative scrub) on her legs.    In the ED, patient was noted to be in A. fib with RVR to 120s, stable blood pressure.   Labs showed WBC count elevated to 24.5, lactic acid 2.2, sodium low at 129, potassium elevated to 5.4, BUN/creatinine 55/2.03, serum bicarb low at 15,  Chest x-ray showed cardiomegaly without overt failure. New mild to moderate compression deformity of mid to lower thoracic vertebra. Patient was admitted to hospital service for sepsis secondary to bilateral lower extremity cellulitis. Course was also complicated and prolonged by acute exacerbation of CHF.  9/3, patient wants to stop aggressive care and pursue comfort care measures.  Subjective: Patient was seen and examined this morning. Looks very weak, wheezing.  Disoriented. Tachycardic  Palliative care follow-up appreciated.  Patient wanted to pursue comfort care measures.  Assessment/Plan: Bilateral lower extremity cellulitis Sepsis -POA -resolved Immunocompromised status -Presented with worsening redness, pain, weeping lesions of both lower extremities. -Antibiotic course completed on 8/22. -Sepsis parameters improved.  WBC normalized.  Acute exacerbation of chronic combined CHF Essential hypertension -EF 35-40% with global hypokinesis. -Cardiology consult appreciated -Patient was  placed on Lasix drip and milrinone drip.  Volume level optimized.  But her respiratory status did not improve.  A. fib with RVR H/o paroxysmal A. Fib s/p left atrial appendage clip History of V. Tach, ICD in situ.  -Presented with RVR, likely precipitated by sepsis. -On amiodarone Eliquis.   Breast cancer with bone mets, Lynch syndrome Chronic pain -Breast cancer diagnosed in May. -Dr. Marin Olp -Continue pain control with MS Contin. As needed Dilaudid  Acute kidney injury on CKD 3a  CAD s/p CABG HLD -On Eliquis.    Hyponatremia -Remains in the range of 130 to 135.  Diabetes mellitus type 2 -A1c 7.2 on 8/15. Hypoglycemia  Bladder prolapse -f/u with urology as an outpatient.  Goals of care -Palliative consulted.   DNR/DNI.  Comfort measures.  Status inpatient  Remains inpatient appropriate because of comfort care status.  Dispo:  Patient From: Home  Planned Disposition: Change complicated status today.  Hospice would be an option  expected discharge date: Next 1 to 2 days  Infusions:  . furosemide (LASIX) infusion    . milrinone 0.25 mcg/kg/min (04/05/20 1824)  . ondansetron (ZOFRAN) IV      Scheduled Meds: . amiodarone  200 mg Oral Daily  . apixaban  5 mg Oral BID  . calcium carbonate  1 tablet Oral TID  . Chlorhexidine Gluconate Cloth  6 each Topical Daily  . exemestane  25 mg Oral QPC breakfast  . insulin aspart  0-15 Units Subcutaneous TID AC & HS  . lactose free nutrition  237 mL Oral BID BM  . metolazone  5 mg Oral BID  . midodrine  5 mg Oral BID WC  . mirtazapine  7.5 mg Oral QHS  .  morphine  15 mg Oral Q8H  . polyethylene glycol  17 g Oral BID  . senna  2 tablet Oral BID  . sodium chloride flush  10-40 mL Intracatheter Q12H    Antimicrobials: Anti-infectives (From admission, onward)   Start     Dose/Rate Route Frequency Ordered Stop   03/20/20 1430  cefTRIAXone (ROCEPHIN) 2 g in sodium chloride 0.9 % 100 mL IVPB        2 g 200 mL/hr over 30  Minutes Intravenous Every 24 hours 03/20/20 1345 03/25/20 1320   03/20/20 0600  vancomycin (VANCOREADY) IVPB 1250 mg/250 mL  Status:  Discontinued        1,250 mg 166.7 mL/hr over 90 Minutes Intravenous Every 48 hours 03/18/20 0230 03/20/20 1345   03/18/20 0330  vancomycin (VANCOREADY) IVPB 1500 mg/300 mL        1,500 mg 150 mL/hr over 120 Minutes Intravenous  Once 03/18/20 0230 03/18/20 0803   03/18/20 0330  meropenem (MERREM) 1 g in sodium chloride 0.9 % 100 mL IVPB  Status:  Discontinued        1 g 200 mL/hr over 30 Minutes Intravenous 2 times daily 03/18/20 0230 03/20/20 1345   03/18/20 0130  piperacillin-tazobactam (ZOSYN) IVPB 3.375 g  Status:  Discontinued        3.375 g 100 mL/hr over 30 Minutes Intravenous  Once 03/18/20 0115 03/18/20 0214      PRN meds: acetaminophen **OR** acetaminophen, glycopyrrolate, HYDROmorphone (DILAUDID) injection, LORazepam, LORazepam, morphine, ondansetron **OR** ondansetron (ZOFRAN) IV, sodium chloride, sodium chloride flush   Objective: Vitals:   04/06/20 0800 04/06/20 1203  BP: 108/74 105/64  Pulse: (!) 121 (!) 121  Resp: (!) 28 20  Temp:  97.9 F (36.6 C)  SpO2: 93% 95%   No intake or output data in the 24 hours ending 04/06/20 1617 Filed Weights   04/03/20 0400 04/05/20 0439 04/06/20 0500  Weight: 75.7 kg 79 kg 75.8 kg   Weight change: -3.204 kg Body mass index is 28.68 kg/m.   Physical Exam: General exam: Tachycardic, lethargic, wheezing Skin: No rashes, lesions or ulcers. HEENT: Atraumatic, normocephalic, supple neck, no obvious bleeding Lungs: Generalized mild wheezing bilaterally CVS: Tachycardic, no murmur  GI/Abd soft, nontender, nondistended, bowel sound present CNS: Lethargic Psychiatry: Anxious  Extremities: Both legs in Unna boots  Data Review: I have personally reviewed the laboratory data and studies available.  Recent Labs  Lab 04/02/20 0153 04/03/20 0151 04/04/20 0500 04/06/20 0828  WBC 8.5 6.8 9.0 10.0    NEUTROABS 6.8 4.8 8.2* 7.6  HGB 9.4* 9.4* 9.5* 9.8*  HCT 30.8* 31.2* 31.1* 31.8*  MCV 89.8 89.7 88.9 88.3  PLT 279 253 265 287   Recent Labs  Lab 04/02/20 0153 04/02/20 0153 04/03/20 0151 04/04/20 0500 04/05/20 0454 04/06/20 0500 04/06/20 0828  NA 134*   < > 133* 132* 132* 131* 130*  K 3.7   < > 3.8 4.7 4.8 5.0 4.9  CL 98   < > 97* 95* 95* 93* 92*  CO2 23   < > 23 23 24 23 23   GLUCOSE 109*   < > 74 144* 149* 143* 91  BUN 56*   < > 61* 75* 75* 81* 80*  CREATININE 2.07*   < > 2.02* 2.27* 2.35* 2.59* 2.68*  CALCIUM 6.7*   < > 6.9* 6.8* 7.0* 7.0* 7.0*  MG 2.1  --  2.1  --  2.1 1.9  --   PHOS 4.5  --  4.5  4.9*  --   --   --    < > = values in this interval not displayed.   Signed, Terrilee Croak, MD [facility] Pager: [phone] (Secure Chat preferred). 04/06/2020

## 2020-04-06 NOTE — Progress Notes (Signed)
Advanced Heart Failure Rounding Note  PCP-Cardiologist: No primary care provider on file.    Patient Profile   74 y/o woman with recently diagnosed metastatic breast CA (undergoing XRT), CAD s/p CABG, DM2, systolic HF with EF 97-67% and PAF.   Admitted with sepsis and lactic acidosis due to severe LE cellulitis in setting of marked volume overload. Course complicated by AF with RVR.  Subjective:    Lasix and milrinone drips increased yesterday for worsening HF. CVP 30 -> 20.  Remains very SOB. Wheezing. Creatinine worse and more uremic today with worsening tremor.   Creatinine up to 2.7  BUN 80   Objective:   Weight Range: 75.8 kg Body mass index is 28.68 kg/m.   Vital Signs:   Temp:  [95.6 F (35.3 C)-98.3 F (36.8 C)] 98.2 F (36.8 C) (09/03 0452) Pulse Rate:  [97-126] 121 (09/03 0800) Resp:  [10-28] 28 (09/03 0800) BP: (71-129)/(36-78) 108/74 (09/03 0800) SpO2:  [93 %-97 %] 93 % (09/03 0800) Weight:  [75.8 kg] 75.8 kg (09/03 0500) Last BM Date: 04/04/20  Weight change: Filed Weights   04/03/20 0400 04/05/20 0439 04/06/20 0500  Weight: 75.7 kg 79 kg 75.8 kg    Intake/Output:   Intake/Output Summary (Last 24 hours) at 04/06/2020 1028 Last data filed at 04/05/2020 1500 Gross per 24 hour  Intake 318.03 ml  Output --  Net 318.03 ml      Physical Exam   General:  Weak appearing wheezy mildly confused HEENT: normal Neck: supple. JVP to ear Carotids 2+ bilat; no bruits. No lymphadenopathy or thryomegaly appreciated. Cor: PMI nondisplaced. irregular tachy Lungs: + crackles wheezing Abdomen: soft, nontender, nondistended. No hepatosplenomegaly. No bruits or masses. Good bowel sounds. Extremities: no cyanosis, clubbing, rash, 3+ edema + UNNA Neuro: occasional confusion but alert & oriented for me  Moves all 4  +asterixis   Telemetry   A fib  120-130s Personally reviewed  Labs    CBC Recent Labs    04/04/20 0500 04/06/20 0828  WBC 9.0 10.0   NEUTROABS 8.2* PENDING  HGB 9.5* 9.8*  HCT 31.1* 31.8*  MCV 88.9 88.3  PLT 265 341   Basic Metabolic Panel Recent Labs    04/04/20 0500 04/04/20 0500 04/05/20 0454 04/05/20 0454 04/06/20 0500 04/06/20 0828  NA 132*   < > 132*   < > 131* 130*  K 4.7   < > 4.8   < > 5.0 4.9  CL 95*   < > 95*   < > 93* 92*  CO2 23   < > 24   < > 23 23  GLUCOSE 144*   < > 149*   < > 143* 91  BUN 75*   < > 75*   < > 81* 80*  CREATININE 2.27*   < > 2.35*   < > 2.59* 2.68*  CALCIUM 6.8*   < > 7.0*   < > 7.0* 7.0*  MG  --   --  2.1  --  1.9  --   PHOS 4.9*  --   --   --   --   --    < > = values in this interval not displayed.   Liver Function Tests Recent Labs    04/04/20 0500 04/06/20 0828  AST 22 24  ALT 23 25  ALKPHOS 112 109  BILITOT 1.5* 1.6*  PROT 6.1* 6.3*  ALBUMIN 2.7* 2.8*   No results for input(s): LIPASE, AMYLASE in the last 72  hours. Cardiac Enzymes No results for input(s): CKTOTAL, CKMB, CKMBINDEX, TROPONINI in the last 72 hours.  BNP: BNP (last 3 results) Recent Labs    12/04/19 1324 03/18/20 0229  BNP 581.1* 912.0*    ProBNP (last 3 results) No results for input(s): PROBNP in the last 8760 hours.   D-Dimer No results for input(s): DDIMER in the last 72 hours. Hemoglobin A1C No results for input(s): HGBA1C in the last 72 hours. Fasting Lipid Panel No results for input(s): CHOL, HDL, LDLCALC, TRIG, CHOLHDL, LDLDIRECT in the last 72 hours. Thyroid Function Tests No results for input(s): TSH, T4TOTAL, T3FREE, THYROIDAB in the last 72 hours.  Invalid input(s): FREET3  Other results:   Imaging    No results found.   Medications:     Scheduled Medications: . amiodarone  200 mg Oral Daily  . apixaban  5 mg Oral BID  . calcium carbonate  1 tablet Oral TID  . Chlorhexidine Gluconate Cloth  6 each Topical Daily  . exemestane  25 mg Oral QPC breakfast  . insulin aspart  0-15 Units Subcutaneous TID AC & HS  . lactose free nutrition  237 mL Oral BID BM   . metolazone  5 mg Oral BID  . midodrine  5 mg Oral BID WC  . mirtazapine  7.5 mg Oral QHS  . morphine  15 mg Oral Q8H  . multivitamin with minerals  1 tablet Oral Daily  . polyethylene glycol  17 g Oral BID  . senna  2 tablet Oral BID  . sodium chloride flush  10-40 mL Intracatheter Q12H    Infusions: . furosemide (LASIX) infusion 30 mg/hr (04/06/20 0732)  . milrinone 0.25 mcg/kg/min (04/05/20 1824)  . ondansetron (ZOFRAN) IV      PRN Medications: acetaminophen **OR** acetaminophen, HYDROmorphone (DILAUDID) injection, LORazepam, morphine, ondansetron **OR** ondansetron (ZOFRAN) IV, sodium chloride, sodium chloride flush   Assessment/Plan    1. Atrial Fibrillation with RVR - S/p MAZE and LAA clipping at time of CABG in 11/2016. S/p DCCV in 09/2015 and more recently 12/09/2019. Noted to be back in atrial fibrillation at last office visit on 01/30/2020 and started on low dose PO Amiodarone. - Presented in atrial fibrillation with RVR in the setting of sepsis secondary to cellulitis of bilateral lower extremities. Difficult situation complicated by patient's intolerance to multiple cardiac meds.  - Not been able to tolerate IV Amiodarone due to severe nausea. - V-rates in the 130s today, in the setting of persistent volume overload and increased milrinone - continue to treat w/ low dose PO amio, 200 mg daily  (refuses higher dose) - off b-blocker due to shock   - Continue Eliquis 5mg  twice daily.  No bleeding - Too unstable for attempt at DCCV and doubt it will help much at this point.   2. 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 PYP in the past negative for amyloid. - Echo LVEF of 35-40% with severe asymmetric LVH, mild MR, moderate TR, and severely elevated PASP. - Chest x-ray showed cardiomegaly with mildly enlarged central pulmonary vessel but no overt edema. BNP 912 - RHC this admit showed continued volume overload and low  output. CI 2.1  - Now on milrinone at 0.375 and lasix gtt at 30. Volume status slightly improved but respiratory status an renal function continue to worsen  - We had a long talk about her situations and progressive HF. I think she is end-stage. We talked about lack of  response to therapy and we discussed options including 1) ICU care 2) continue current care 3) comfort care. She reiterated clearly that she is not interested in ICU care, dialysis or intubation. Has told me today that she is tired and doesn't want to keep suffering. She is interested in morphine gtt. I called her HCPOA (her grandson [name]) and he would like to come to bedside to discuss prior to starting morphine. I also d/w Palliative Care team  3. Acute on Chronic Kidney Disease - baseline SCr 1.0-1.30 range  - now up to 2.8. She is uremic and volume overloaded - Does not want dialysis  4. CAD  - S/p CABG x4 in 11/2016.  - High-sensitivity troponin minimally elevated and flat at 22 >> 24. Not consistent with ACS. Suspect demand ischemia in setting of sepsis and atrial fibrillation with RVR. - No s/s angina - No Aspirin with need for Eliquis.   5. Sepsis Secondary to Bilateral Lower Extremity Cellulitis - initially placed on broad spectrum abx w/ vanc + meropenum. AF. Lactic acid normalized.  Blood cultures NGTD  - has completed course of abx, AF.  - Resolved  6. 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 - Diagnosed in 12/2019. Lynch syndrome diagnosed by genetic study.  - Follows with Dr. Marin Olp. He has been seeing her on a daily basis and also remains concerned about her prognosis   Length of Stay: 29  Glori Bickers, MD  04/06/2020, 10:28 AM   Advanced Heart Failure Team Pager [phone] (M-F; Kay)  Please contact [facility] for night-coverage after hours (4p -7a ) and weekends on [email or web address]

## 2020-04-06 NOTE — Progress Notes (Signed)
Unfortunately, I have a bad feeling that [name] is really not going to get any better.  She definitely is weaker.  She is having these spasms.  She is little more confused.  She is talking about going home.  I think I know exactly what she means by "home."  She had a chest x-ray yesterday.  It showed some pulmonary vascular congestion.  There has been I think very little oral intake.  She just has the "look" and I have a feeling that we are looking at a very limited amount of time.  Labs not yet back today.  I know she is trying hard.  I just think her body has nothing left to give.  She is dealing with both congestive heart failure and metastatic breast cancer.  I know that palliative care has seen her.  I appreciate their input.  I think that she manages to stabilize, I think that Hospice is going to be necessary.  I just do not see her going home.  She is quite tachycardic this morning.  She feels short of breath.  She might need some oxygen.  I am going to check a prealbumin on her.  It would not surprise me if this is less than 10.  I know that she has gotten great care from all the staff on [address].  This is a complicated situation.  I appreciate the compassion that all of the staff I have given her.  Lattie Haw, MD  2 Timothy 4:17-18

## 2020-04-06 NOTE — Progress Notes (Signed)
Daily Progress Note   Patient Name: [name]       Date: 04/06/2020 DOB: 08/03/1946  Age: 74 y.o. MRN#: [id number] Attending Physician: Terrilee Croak, MD Primary Care Physician: Patient, No Pcp Per Admit Date: 03/23/2020  Reason for Consultation/Follow-up: Establishing goals of care  Subjective: Patient awake but appears uncomfortable with dyspnea and wheezing. Some confusion with worsening creatinine likely uremia. Tremors.   Discussed with Dr. Haroldine Laws who spoke with patient and grandson. Patient shared that she is tired and does not want to keep suffering. Waiting on grandson to arrive to bedside for further discussion before transition to comfort and initiation of morphine gtt.   GOC:   F/u with patient and grandson, [name] this afternoon. Discussed diagnoses interventions, plan of care, poor prognosis. [name] understands and respects his grandmother's decision for shift to comfort. He wishes for other family members to visit before transition to comfort. Discussed with RN. Awaiting other family to arrive.   This afternoon, further f/u with patient, grandson, and other family members at bedside including patient's sister, daughter, and brother-in-law. Discussed in detail course of hospitalization including diagnoses, interventions, plan of care, poor prognosis. Patient again speaks of her desire to be kept comfortable. She does not wish for aggressive measures including transfer to ICU for dialysis. Family understands this would only prolong the inevitable of her end-stage heart failure.   Discussed transition to comfort measures only emphasizing focus on comfort medications to ensure relief from suffering as she nears EOL. Prepared family for 'anything to happen at anytime' once  interventions not aimed at comfort have been discontinued. Medically recommended initiation of continuous infusion as patient appears uncomfortable and this is with milrinone and lasix still infusing. Patient and family agree with continuous infusion for comfort. Discussed comfort measures and visitor policy. Discussed deactivation of ICD. Patient/family understand and agree. Answered questions and concerns. Spiritual/emotional support provided. Grandson has PMT contact information.   Updated RN, Dr Pietro Cassis and Dr. Haroldine Laws.    Length of Stay: 19  Current Medications: Scheduled Meds:  . amiodarone  200 mg Oral Daily  . apixaban  5 mg Oral BID  . calcium carbonate  1 tablet Oral TID  . Chlorhexidine Gluconate Cloth  6 each Topical Daily  . exemestane  25 mg Oral QPC breakfast  . insulin aspart  0-15 Units Subcutaneous TID AC & HS  . lactose free nutrition  237 mL Oral BID BM  . metolazone  5 mg Oral BID  . midodrine  5 mg Oral BID WC  . mirtazapine  7.5 mg Oral QHS  . morphine  15 mg Oral Q8H  . multivitamin with minerals  1 tablet Oral Daily  . polyethylene glycol  17 g Oral BID  . senna  2 tablet Oral BID  . sodium chloride flush  10-40 mL Intracatheter Q12H    Continuous Infusions: . furosemide (LASIX) infusion 30 mg/hr (04/06/20 0732)  . milrinone 0.25 mcg/kg/min (04/05/20 1824)  . ondansetron (ZOFRAN) IV      PRN Meds: acetaminophen **OR** acetaminophen, HYDROmorphone (DILAUDID) injection, LORazepam, morphine, ondansetron **OR** ondansetron (ZOFRAN) IV, sodium chloride, sodium chloride flush  Physical Exam Vitals and nursing note reviewed.  Constitutional:      Appearance: She is ill-appearing.  Cardiovascular:     Rate and Rhythm: Rhythm irregularly irregular.  Pulmonary:     Breath sounds: Wheezing present.     Comments: Dyspnea at rest Skin:    General: Skin is warm and dry.  Neurological:     Mental Status: She is easily aroused.     Comments: Wakes to voice,  confused/uremic. Tremors            Vital Signs: BP 108/74   Pulse (!) 121   Temp 98.2 F (36.8 C) (Oral)   Resp (!) 28   Ht 5\' 4"  (1.626 m)   Wt 75.8 kg   SpO2 93%   BMI 28.68 kg/m  SpO2: SpO2: 93 % O2 Device: O2 Device: Nasal Cannula O2 Flow Rate: O2 Flow Rate (L/min): 2 L/min  Intake/output summary:   Intake/Output Summary (Last 24 hours) at 04/06/2020 1117 Last data filed at 04/05/2020 1500 Gross per 24 hour  Intake 318.03 ml  Output --  Net 318.03 ml   LBM: Last BM Date: 04/04/20 Baseline Weight: Weight: 77.1 kg Most recent weight: Weight: 75.8 kg       Palliative Assessment/Data: PPS 30%      Patient Active Problem List   Diagnosis Date Noted  . Malnutrition of moderate degree 03/27/2020  . 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 Advanced care planning/counseling discussion   . Palliative care by specialist   . Reactive depression   . Sepsis due to cellulitis ([place]) 03/18/2020  . Hypocalcemia 03/18/2020  . Cellulitis of left lower extremity 03/18/2020  . Hyperkalemia 03/18/2020  . Dehydration with hyponatremia 03/18/2020  . Lactic acidosis 03/18/2020  . Family history of breast cancer   . Lynch syndrome   . Genetic testing 01/09/2020  . Breast cancer metastasized to bone ([place]) 12/06/2019  . Goals of care, counseling/discussion 12/06/2019  . HTN (hypertension)   . Chest pain   . Pressure injury of skin 12/05/2016  . 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 NSTEMI (non-ST elevated myocardial infarction) ([place]) 11/27/2016  . Ventricular fibrillation ([place]) 11/27/2016  . Head injury   . ICD (implantable cardioverter-defibrillator) discharge   . Diabetic neuropathy ([place]) 06/16/2016  . CKD (chronic kidney disease) stage 3, GFR 30-59 ml/min 06/16/2016  . Fatigue 10/30/2015  . Paroxysmal atrial fibrillation with rapid ventricular response ([place]) 10/02/2015  . Acute on chronic systolic CHF (congestive heart failure) ([place]) 10/02/2015   . Atrial flutter ([place]) 09/18/2015  . Chronic anticoagulation-Xarelto 09/18/2015  . Acute on chronic congestive heart failure ([place]) 09/18/2015  . Orthostatic hypotension   .  ICD in place- St Jude   . HLD (hyperlipidemia) 05/11/2015  . Syncope 05/11/2015  . Abdominal pain 05/11/2015  . Acute kidney injury ([place])   . Nausea & vomiting   . Hypothyroidism 04/27/2015  . Chronic diastolic CHF (congestive heart failure) ([place])   . Abnormal PFT   . CAD (coronary artery disease) 04/21/2015  . Acute respiratory failure ([place])   . Ventricular tachycardia- Sept 2016- ICD 04/13/2015  . UTI (lower urinary tract infection) 03/31/2015  . Sepsis ([place]) 03/31/2015  . Atrial fibrillation with RVR ([place]) 03/31/2015  . Systolic CHF ([place])   . Hypertrophic cardiomyopathy ([place])   . Septic bursitis of elbow 02/03/2013  . Acute blood loss anemia 01/08/2013  . Diabetes mellitus type 2, noninsulin dependent ([place])   . Cellulitis 12/31/2012  . Fasting hyperglycemia 12/31/2012  . Leukocytosis 12/31/2012  . Hypotension 12/31/2012    Palliative Care Assessment & Plan   Patient Profile: 74 y.o. female Patient with Stage IV breast cancer, currently s/p one round of nivolumab, has had radiation tx to spine, previously on Ibrance but did not tolerate; CHF with EF 35-40%, atrial fib/flutter- St. Jude ICD in place, HTN, DM2, CKD III. Currently admitted on 03/28/2020 with sepsis secondary to bilateral lower extremity cellulitis. Admission complicated by afib with RVR- intolerant to many cardiac medications.  Palliative consulted for goals of care.  9/3 Clinically worsening. Remains on milrinone and lasix infusions. Patient uncomfortable with dyspnea and wheezing. Creatinine worsening with uremia and tremors.   Assessment: Acute on chronic combined CHF EF 35-40%  Cardiogenic shock Hx of ischemic cardiomyopathy Afib RVR AKI on CKD CAD Sepsis resolved, BLE cellulitis Metastatic breast cancer with bone mets Lynch  syndrome  Recommendations/Plan:  After multiple GOC discussions today, plan is for transition to comfort measures only per patient wishes. Family understands and respects her decision. Patient/family understand diagnoses and poor prognosis.   Symptom management  Initiate continuous dilaudid infusion 0.5mg /hr  RN may bolus dilaudid via infusion 0.5mg  q56min prn pain/dyspnea/air hunger/tachypnea  Robinul 0.2mg  IV q6h scheduled  Robinul for breakthrough secretions  Ativan 0.5mg  IV q4h prn anxiety  Discontinue interventions not aimed at comfort including cardiac monitor.  Deactivate ICD  Comfort feeds per patient/family request  Ongoing spiritual care support  Unrestricted visitor access as patient is nearing EOL. Anticipate she will pass inpatient.    Code Status: DNR/DNI   Code Status Orders  (From admission, onward)         Start     Ordered   03/19/20 1751  Do not attempt resuscitation (DNR)  Continuous       Question Answer Comment  In the event of cardiac or respiratory ARREST Do not call a "code blue"   In the event of cardiac or respiratory ARREST Do not perform Intubation, CPR, defibrillation or ACLS   In the event of cardiac or respiratory ARREST Use medication by any route, position, wound care, and other measures to relive pain and suffering. May use oxygen, suction and manual treatment of airway obstruction as needed for comfort.      03/19/20 1750        Code Status History    Date Active Date Inactive Code Status Order ID Comments User Context   03/18/2020 0254 03/19/2020 1750 Full Code [id number]  Vernelle Emerald, MD ED   12/04/2019 1812 12/11/2019 0423 Full Code [id number]  Mckinley Jewel, MD Inpatient   12/04/2019 1559 12/04/2019 1812 DNR [id number]  Mckinley Jewel, MD ED  12/04/2019 1537 12/04/2019 1559 Full Code [id number]  Mckinley Jewel, MD ED   11/27/2016 1350 01/08/2017 1922 Full Code [id number]  Waynetta Pean Inpatient   09/17/2015 2117  09/24/2015 1703 Full Code [id number]  Burtis Junes, NP Inpatient   05/11/2015 2245 05/14/2015 1820 Full Code [id number]  Ivor Costa, MD ED   04/19/2015 0837 04/23/2015 1722 Full Code [id number]  [name], MD Inpatient   04/16/2015 1749 04/19/2015 0837 Full Code [id number]  [name], MD Inpatient   04/13/2015 1709 04/16/2015 1749 Full Code [id number]  Marshell Garfinkel, MD ED   03/31/2015 0604 04/05/2015 2132 Full Code [id number]  Theressa Millard, MD Inpatient   01/06/2013 2344 01/17/2013 1636 Full Code [id number]  [name], MD Inpatient   12/31/2012 1800 01/06/2013 2344 Full Code [id number]  Theodis Blaze, MD ED   Advance Care Planning Activity       Prognosis:   Hours - Days once life-prolonging interventions are discontinued  Discharge Planning:  Anticipate hospital death  Care plan was discussed with Dr. Haroldine Laws, Dr Pietro Cassis, RN, patient, grandson [name]), multiple family members at bedside  Thank you for allowing the Palliative Medicine Team to assist in the care of this patient.   Total Time 120 Prolonged Time Billed  yes      Greater than 50%  of this time was spent counseling and coordinating care related to the above assessment and plan.  Ihor Dow, DNP, FNP-C Palliative Medicine Team  Phone: [phone] Fax: [phone]  Please contact Palliative Medicine Team phone at [phone] for questions and concerns.

## 2020-04-06 NOTE — Progress Notes (Signed)
[employer].Jude turned ICD off on unit   Phoebe Sharps, RN

## 2020-04-06 NOTE — Progress Notes (Addendum)
PT Cancellation Note  Patient Details Name: [name] MRN: [id number] DOB: 1945/08/23   Cancelled Treatment:    Reason Eval/Treat Not Completed: Patient declined, no reason specified (pt sitting in bed visiting with grandson with HR 119 and SpO2 94% on 2L and reports SOB, fatigue and current inability to tolerate mobility. Pt does state she is willing to have HHPT at D/C)   Danis Pembleton B Haila Dena 04/06/2020, 11:57 AM [place] Pager: [phone] Office: [phone]

## 2020-04-06 NOTE — Progress Notes (Signed)
Pt showing increased signs of confusion and tremors. Pt states seeing her dog "Lexie" in the room when no dog was present. Dr. Clearence Ped made aware. No new orders. Will continue to monitor. Adella Hare, RN

## 2020-04-07 DIAGNOSIS — R0600 Dyspnea, unspecified: Secondary | ICD-10-CM

## 2020-04-07 MED ORDER — ONDANSETRON HCL 4 MG/2ML IJ SOLN
4.0000 mg | Freq: Four times a day (QID) | INTRAMUSCULAR | Status: DC | PRN
  Administered 2020-04-07 – 2020-04-11 (×4): 4 mg via INTRAVENOUS
  Filled 2020-04-07 (×5): qty 2

## 2020-04-07 MED ORDER — HYDROMORPHONE BOLUS VIA INFUSION
2.0000 mg | INTRAVENOUS | Status: DC | PRN
  Administered 2020-04-10 – 2020-04-16 (×19): 2 mg via INTRAVENOUS
  Filled 2020-04-07: qty 2

## 2020-04-07 NOTE — Progress Notes (Signed)
PROGRESS NOTE    [name]  IPJ:[id number] DOB: Dec 25, 1945 DOA: 03/06/2020 PCP: Patient, No Pcp Per   Brief Narrative: Patient is a 74 y.o.femalewith PMH of DM2, HTN, HLD, CAD s/p CABG, 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 fib, ventricular tachycardia status post ICD in situ, breast cancer with bone mets, Lynch syndrome  who presented to the ED on8/14/2021with complaint of shortness of breath, worsening redness on both upper legs with weeping lesions that started gradually after she used dermatome (exfoliative scrub) on her legs.   In the ED, patient was noted to be in A. fib with RVR to 120s, stable blood pressure.   Labs showed WBC count elevated to 24.5, lactic acid 2.2, sodium low at 129, potassium elevated to 5.4, BUN/creatinine 55/2.03, serum bicarb low at 15.  Chest x-ray showed cardiomegaly without overt failure.Patient was admitted to hospital service for sepsis secondary to bilateral lower extremity cellulitis. Hospital course was also complicated and was prolonged by acute exacerbation of CHF/cardiogenic shock.  Heart failure team is following.  After discussion about extensive goals of care, patient wanted to stop aggressive care and pursue comfort care measures.  Palliative care also following.  Currently on full comfort care.  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 After extensive  discussion with patient and family, started on full comfort care.  Acute exacerbation of chronic combined CHF/cardiogenic shock/ Essential hypertension -EF 35-40% with global hypokinesis. -Cardiology  team was following -Patient was placed on Lasix drip and milrinone drip.  But her respiratory status did not improve.  Bilateral lower extremity cellulitis Sepsis -POA -resolved Immunocompromised status -Presented with worsening redness, pain, weeping lesions of both lower extremities. -Antibiotic course completed on 8/22.  A. fib with RVR H/o paroxysmal A. Fib s/p left atrial appendage clip History of V. Tach, ICD in situ.  -Presented with RVR, likely precipitated by sepsis. -She was on amiodarone, Eliquis.   Breast cancer with bone mets, Lynch syndrome Chronic pain -Breast cancer diagnosed in May. -Follows with Dr. Marin Olp  Acute kidney injury on CKD 3a -Kidney function continued to worsen due to ATN -She was uremic and volume overloaded.  CAD s/p CABG HLD -On Eliquis.    Hyponatremia -Remains in the range of 130 to 135.  Diabetes mellitus type 2  -A1c 7.2 on 8/15.  Bladder prolapse -Follows  with urology as an outpatient.  Nutrition Problem: Moderate Malnutrition Etiology: chronic illness, cancer and cancer related treatments      DVT prophylaxis:None Code Status: DNR/comfort care Family Communication: Sister and daughter present at bedside Status is: Inpatient  Remains inpatient appropriate because:Inpatient level of care appropriate due to severity of illness   Dispo:  Patient From: Home  Planned Disposition: Hospital death anticipated      Consultants: Cardiology   Antimicrobials:  Anti-infectives (From admission, onward)   Start     Dose/Rate Route Frequency Ordered Stop   03/20/20  1430  cefTRIAXone (ROCEPHIN) 2 g in sodium chloride 0.9 % 100 mL IVPB        2 g 200 mL/hr over 30 Minutes Intravenous Every 24 hours 03/20/20 1345 03/25/20  1320   03/20/20 0600  vancomycin (VANCOREADY) IVPB 1250 mg/250 mL  Status:  Discontinued        1,250 mg 166.7 mL/hr over 90 Minutes Intravenous Every 48 hours 03/18/20 0230 03/20/20 1345   03/18/20 0330  vancomycin (VANCOREADY) IVPB 1500 mg/300 mL        1,500 mg 150 mL/hr over 120 Minutes Intravenous  Once 03/18/20 0230 03/18/20 0803   03/18/20 0330  meropenem (MERREM) 1 g in sodium chloride 0.9 % 100 mL IVPB  Status:  Discontinued        1 g 200 mL/hr over 30 Minutes Intravenous 2 times daily 03/18/20 0230 03/20/20 1345   03/18/20 0130  piperacillin-tazobactam (ZOSYN) IVPB 3.375 g  Status:  Discontinued        3.375 g 100 mL/hr over 30 Minutes Intravenous  Once 03/18/20 0115 03/18/20 0214      Subjective: Patient seen and examined the bedside this morning.  Sister and daughter were present at the bedside.  She was on full comfort care.  Objective: Vitals:   04/06/20 0800 04/06/20 1203 04/06/20 2129 04/07/20 0753  BP: 108/74 105/64 (!) 97/52 118/65  Pulse: (!) 121 (!) 121 (!) 123   Resp: (!) 28 20 10  (!) 8  Temp:  97.9 F (36.6 C) 98.1 F (36.7 C)   TempSrc:  Oral Oral   SpO2: 93% 95% 94% 96%  Weight:      Height:        Intake/Output Summary (Last 24 hours) at 04/07/2020 1155 Last data filed at 04/07/2020 0320 Gross per 24 hour  Intake 155.85 ml  Output --  Net 155.85 ml   Filed Weights   04/03/20 0400 04/05/20 0439 04/06/20 0500  Weight: 75.7 kg 79 kg 75.8 kg    Examination:  General exam: Comfortable, not in any kind of distress, on full comfort care   Data Reviewed: I have personally reviewed following labs and imaging studies  CBC: Recent Labs  Lab 04/02/20 0153 04/03/20 0151 04/04/20 0500 04/06/20 0828  WBC 8.5 6.8 9.0 10.0  NEUTROABS 6.8 4.8 8.2* 7.6  HGB 9.4* 9.4* 9.5* 9.8*  HCT 30.8* 31.2* 31.1* 31.8*  MCV 89.8 89.7 88.9 88.3  PLT 279 253 265 811   Basic Metabolic Panel: Recent Labs  Lab 04/02/20 0153 04/02/20 0153 04/03/20 0151  04/04/20 0500 04/05/20 0454 04/06/20 0500 04/06/20 0828  NA 134*   < > 133* 132* 132* 131* 130*  K 3.7   < > 3.8 4.7 4.8 5.0 4.9  CL 98   < > 97* 95* 95* 93* 92*  CO2 23   < > 23 23 24 23 23   GLUCOSE 109*   < > 74 144* 149* 143* 91  BUN 56*   < > 61* 75* 75* 81* 80*  CREATININE 2.07*   < > 2.02* 2.27* 2.35* 2.59* 2.68*  CALCIUM 6.7*   < > 6.9* 6.8* 7.0* 7.0* 7.0*  MG 2.1  --  2.1  --  2.1 1.9  --   PHOS 4.5  --  4.5 4.9*  --   --   --    < > = values in this interval not displayed.   GFR: Estimated Creatinine Clearance: 18.9 mL/min (A) (by C-G formula based on SCr of 2.68  mg/dL (H)). Liver Function Tests: Recent Labs  Lab 04/01/20 0813 04/02/20 0153 04/03/20 0151 04/04/20 0500 04/06/20 0828  AST  --  20 20 22 24   ALT  --  21 20 23 25   ALKPHOS  --  106 106 112 109  BILITOT  --  1.4* 1.8* 1.5* 1.6*  PROT  --  5.5* 5.4* 6.1* 6.3*  ALBUMIN 2.3* 2.4* 2.3* 2.7* 2.8*   No results for input(s): LIPASE, AMYLASE in the last 168 hours. Recent Labs  Lab 04/06/20 0828  AMMONIA 45*   Coagulation Profile: No results for input(s): INR, PROTIME in the last 168 hours. Cardiac Enzymes: No results for input(s): CKTOTAL, CKMB, CKMBINDEX, TROPONINI in the last 168 hours. BNP (last 3 results) No results for input(s): PROBNP in the last 8760 hours. HbA1C: No results for input(s): HGBA1C in the last 72 hours. CBG: Recent Labs  Lab 04/05/20 1238 04/05/20 1700 04/05/20 2057 04/06/20 0603 04/06/20 1159  GLUCAP 79 141* 187* 96 103*   Lipid Profile: No results for input(s): CHOL, HDL, LDLCALC, TRIG, CHOLHDL, LDLDIRECT in the last 72 hours. Thyroid Function Tests: No results for input(s): TSH, T4TOTAL, FREET4, T3FREE, THYROIDAB in the last 72 hours. Anemia Panel: No results for input(s): VITAMINB12, FOLATE, FERRITIN, TIBC, IRON, RETICCTPCT in the last 72 hours. Sepsis Labs: No results for input(s): PROCALCITON, LATICACIDVEN in the last 168 hours.  No results found for this or any  previous visit (from the past 240 hour(s)).       Radiology Studies: DG Chest 1 View  Result Date: 04/06/2020 CLINICAL DATA:  Heart failure EXAM: CHEST  1 VIEW COMPARISON:  Yesterday FINDINGS: Cardiomegaly. Dual-chamber ICD from the left with CABG and left atrial clipping. There is a right-sided PICC with angulation at the tip implying azygos cannulation. Mild interstitial coarsening is unchanged. IMPRESSION: 1. Stable cardiomegaly and interstitial coarsening. 2. Right PICC with tip likely cannulating the azygos. Electronically Signed   By: Monte Fantasia M.D.   On: 04/06/2020 10:54        Scheduled Meds: . glycopyrrolate  0.2 mg Intravenous Q6H  . lactose free nutrition  237 mL Oral BID BM  . mirtazapine  7.5 mg Oral QHS  . sodium chloride flush  10-40 mL Intracatheter Q12H   Continuous Infusions: . HYDROmorphone 2 mg/hr (04/07/20 0932)  . ondansetron (ZOFRAN) IV       LOS: 20 days    Time spent: 15 mins,     Shelly Coss, MD [facility] P9/11/2019, 11:55 AM

## 2020-04-07 NOTE — Progress Notes (Signed)
Advanced Heart Failure Rounding Note  PCP-Cardiologist: No primary care provider on file.   Subjective:    Switched to comfort care yesterday. Now on dilaudid drip at 0.5  Awake. Confused at times. Very tremulous. Denies SOB.   Objective:   Weight Range: 75.8 kg Body mass index is 28.68 kg/m.   Vital Signs:   Temp:  [97.9 F (36.6 C)-98.1 F (36.7 C)] 98.1 F (36.7 C) (09/03 2129) Pulse Rate:  [121-123] 123 (09/03 2129) Resp:  [10-28] 10 (09/03 2129) BP: (97-108)/(52-74) 97/52 (09/03 2129) SpO2:  [93 %-95 %] 94 % (09/03 2129) Last BM Date: 04/04/20  Weight change: Filed Weights   04/03/20 0400 04/05/20 0439 04/06/20 0500  Weight: 75.7 kg 79 kg 75.8 kg    Intake/Output:   Intake/Output Summary (Last 24 hours) at 04/07/2020 0556 Last data filed at 04/07/2020 0320 Gross per 24 hour  Intake 155.85 ml  Output --  Net 155.85 ml      Physical Exam   General:  Awake  Tremulous  Asking if she is dead HEENT: normal Neck: supple. JVP to ear  Carotids 2+ bilat; no bruits. No lymphadenopathy or thryomegaly appreciated. Cor: PMI nondisplaced. irregular tachy Lungs: mild wheeze Abdomen: soft, nontender, + distended. No hepatosplenomegaly. No bruits or masses. Good bowel sounds. Extremities: no cyanosis, clubbing, rash, 3+ edema Neuro:a wake confused tremulous   Telemetry   A fib  130s Personally reviewed  Labs    CBC Recent Labs    04/06/20 0828  WBC 10.0  NEUTROABS 7.6  HGB 9.8*  HCT 31.8*  MCV 88.3  PLT 784   Basic Metabolic Panel Recent Labs    04/05/20 0454 04/05/20 0454 04/06/20 0500 04/06/20 0828  NA 132*   < > 131* 130*  K 4.8   < > 5.0 4.9  CL 95*   < > 93* 92*  CO2 24   < > 23 23  GLUCOSE 149*   < > 143* 91  BUN 75*   < > 81* 80*  CREATININE 2.35*   < > 2.59* 2.68*  CALCIUM 7.0*   < > 7.0* 7.0*  MG 2.1  --  1.9  --    < > = values in this interval not displayed.   Liver Function Tests Recent Labs    04/06/20 0828  AST 24   ALT 25  ALKPHOS 109  BILITOT 1.6*  PROT 6.3*  ALBUMIN 2.8*   No results for input(s): LIPASE, AMYLASE in the last 72 hours. Cardiac Enzymes No results for input(s): CKTOTAL, CKMB, CKMBINDEX, TROPONINI in the last 72 hours.  BNP: BNP (last 3 results) Recent Labs    12/04/19 1324 03/18/20 0229  BNP 581.1* 912.0*    ProBNP (last 3 results) No results for input(s): PROBNP in the last 8760 hours.   D-Dimer No results for input(s): DDIMER in the last 72 hours. Hemoglobin A1C No results for input(s): HGBA1C in the last 72 hours. Fasting Lipid Panel No results for input(s): CHOL, HDL, LDLCALC, TRIG, CHOLHDL, LDLDIRECT in the last 72 hours. Thyroid Function Tests No results for input(s): TSH, T4TOTAL, T3FREE, THYROIDAB in the last 72 hours.  Invalid input(s): FREET3  Other results:   Imaging    DG Chest 1 View  Result Date: 04/06/2020 CLINICAL DATA:  Heart failure EXAM: CHEST  1 VIEW COMPARISON:  Yesterday FINDINGS: Cardiomegaly. Dual-chamber ICD from the left with CABG and left atrial clipping. There is a right-sided PICC with angulation at the tip implying  azygos cannulation. Mild interstitial coarsening is unchanged. IMPRESSION: 1. Stable cardiomegaly and interstitial coarsening. 2. Right PICC with tip likely cannulating the azygos. Electronically Signed   By: Monte Fantasia M.D.   On: 04/06/2020 10:54     Medications:     Scheduled Medications: . glycopyrrolate  0.2 mg Intravenous Q6H  . lactose free nutrition  237 mL Oral BID BM  . mirtazapine  7.5 mg Oral QHS  . sodium chloride flush  10-40 mL Intracatheter Q12H    Infusions: . HYDROmorphone 0.5 mg/hr (04/06/20 1749)  . ondansetron (ZOFRAN) IV      PRN Medications: acetaminophen **OR** acetaminophen, diphenhydrAMINE, glycopyrrolate, HYDROmorphone, HYDROmorphone (DILAUDID) injection, LORazepam, ondansetron **OR** ondansetron (ZOFRAN) IV, polyethylene glycol, senna, sodium chloride, sodium chloride  flush   Assessment/Plan   1. 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 PYP in the past negative for amyloid. - Echo LVEF of 35-40% with severe asymmetric LVH, mild MR, moderate TR, and severely elevated PASP. - Now end stage. On comfort care.  - She seems uncomfortable this am in setting of worsening volume status and uremia.  - Increase dilaudid gtt to 2  - D/w grandson   2. Atrial Fibrillation with RVR - S/p MAZE and LAA clipping at time of CABG in 11/2016. S/p DCCV in 09/2015 and more recently 12/09/2019. Noted to be back in atrial fibrillation at last office visit on 01/30/2020 and started on low dose PO Amiodarone. - Presented in atrial fibrillation with RVR in the setting of sepsis secondary to cellulitis of bilateral lower extremities. Difficult situation complicated by patient's intolerance to multiple cardiac meds.  - uncontrolled .  3. Acute on Chronic Kidney Disease - continues to worsen due to ATN - uremic and volume overloaded.  - increase dilaudid  4. CAD  - S/p CABG x4 in 11/2016.  - High-sensitivity troponin minimally elevated and flat at 22 >> 24. Not consistent with ACS. Suspect demand ischemia in setting of sepsis and atrial fibrillation with RVR. - No s/s angina  5. Sepsis Secondary to Bilateral Lower Extremity Cellulitis - initially placed on broad spectrum abx w/ vanc + meropenum. AF. Lactic acid normalized.  Blood cultures NGTD  - has completed course of abx, AF.  - Resolved  6. 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 - Diagnosed in 12/2019. Lynch syndrome diagnosed by genetic study.  - Follows with Dr. Marin Olp. He has been seeing her on a daily basis and also remains concerned about her prognosis   Length of Stay: Pray, MD  04/07/2020, 5:56 AM   Advanced Heart Failure Team Pager [phone] (M-F; 7a - 4p)  Please contact [facility] for night-coverage after hours (4p  -7a ) and weekends on [email or web address]

## 2020-04-07 NOTE — Progress Notes (Signed)
Daily Progress Note   Patient Name: [name]       Date: 04/07/2020 DOB: 03/14/1946  Age: 74 y.o. MRN#: [id number] Attending Physician: Shelly Coss, MD Primary Care Physician: Patient, No Pcp Per Admit Date: 03/12/2020  Reason for Consultation/Follow-up: Establishing goals of care  Subjective: Patient resting comfortably on dilaudid infusion. Basal rate increased by Dr. Haroldine Laws this AM during rounds as patient was uncomfortable. No current s/s of pain or distress.   [name] at bedside. Discussed ongoing comfort focused care plan. Answered questions. [name] has PMT contact information and knows to call with questions or concerns this afternoon.  Discussed with RN.   Length of Stay: 20  Current Medications: Scheduled Meds:  . glycopyrrolate  0.2 mg Intravenous Q6H  . lactose free nutrition  237 mL Oral BID BM  . mirtazapine  7.5 mg Oral QHS  . sodium chloride flush  10-40 mL Intracatheter Q12H    Continuous Infusions: . HYDROmorphone 2 mg/hr (04/07/20 8757)  . ondansetron (ZOFRAN) IV      PRN Meds: acetaminophen **OR** acetaminophen, diphenhydrAMINE, glycopyrrolate, HYDROmorphone, HYDROmorphone (DILAUDID) injection, LORazepam, ondansetron **OR** ondansetron (ZOFRAN) IV, polyethylene glycol, senna, sodium chloride, sodium chloride flush  Physical Exam Vitals and nursing note reviewed.  Constitutional:      Appearance: She is ill-appearing.  Cardiovascular:     Rate and Rhythm: Rhythm irregularly irregular.  Pulmonary:     Effort: No tachypnea, accessory muscle usage or respiratory distress.     Breath sounds: Decreased breath sounds present.     Comments: Comfortable on dilaudid gtt Skin:    General: Skin is warm and dry.  Neurological:     Mental  Status: She is easily aroused.     Comments: Sleeping. Comfortable on dilaudid gtt.            Vital Signs: BP 118/65 (BP Location: Left Arm)   Pulse (!) 123   Temp 98.1 F (36.7 C) (Oral)   Resp (!) 8   Ht 5\' 4"  (1.626 m)   Wt 75.8 kg   SpO2 96%   BMI 28.68 kg/m  SpO2: SpO2: 96 % O2 Device: O2 Device: Nasal Cannula O2 Flow Rate: O2 Flow Rate (L/min): 2 L/min  Intake/output summary:   Intake/Output Summary (Last 24 hours) at 04/07/2020 1011 Last data filed at 04/07/2020  0320 Gross per 24 hour  Intake 155.85 ml  Output --  Net 155.85 ml   LBM: Last BM Date: 04/04/20 Baseline Weight: Weight: 77.1 kg Most recent weight: Weight: 75.8 kg       Palliative Assessment/Data: PPS 30%      Patient Active Problem List   Diagnosis Date Noted  . Dyspnea   . Malnutrition of moderate degree 03/27/2020  . 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 Terminal care   . Palliative care by specialist   . Reactive depression   . Sepsis due to cellulitis ([place]) 03/18/2020  . Hypocalcemia 03/18/2020  . Cellulitis of left lower extremity 03/18/2020  . Hyperkalemia 03/18/2020  . Dehydration with hyponatremia 03/18/2020  . Lactic acidosis 03/18/2020  . Family history of breast cancer   . Lynch syndrome   . Genetic testing 01/09/2020  . Breast cancer metastasized to bone ([place]) 12/06/2019  . Goals of care, counseling/discussion 12/06/2019  . HTN (hypertension)   . Chest pain   . Pressure injury of skin 12/05/2016  . 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 NSTEMI (non-ST elevated myocardial infarction) ([place]) 11/27/2016  . Ventricular fibrillation ([place]) 11/27/2016  . Head injury   . ICD (implantable cardioverter-defibrillator) discharge   . Diabetic neuropathy ([place]) 06/16/2016  . CKD (chronic kidney disease) stage 3, GFR 30-59 ml/min 06/16/2016  . Fatigue 10/30/2015  . Paroxysmal atrial fibrillation with rapid ventricular response ([place]) 10/02/2015  . Acute on  chronic systolic CHF (congestive heart failure) ([place]) 10/02/2015  . Atrial flutter ([place]) 09/18/2015  . Chronic anticoagulation-Xarelto 09/18/2015  . Acute on chronic congestive heart failure ([place]) 09/18/2015  . Orthostatic hypotension   . ICD in place- St Jude   . HLD (hyperlipidemia) 05/11/2015  . Syncope 05/11/2015  . Abdominal pain 05/11/2015  . Acute kidney injury ([place])   . Nausea & vomiting   . Hypothyroidism 04/27/2015  . Chronic diastolic CHF (congestive heart failure) ([place])   . Abnormal PFT   . CAD (coronary artery disease) 04/21/2015  . Acute respiratory failure ([place])   . Ventricular tachycardia- Sept 2016- ICD 04/13/2015  . UTI (lower urinary tract infection) 03/31/2015  . Sepsis ([place]) 03/31/2015  . Atrial fibrillation with RVR ([place]) 03/31/2015  . Systolic CHF ([place])   . Hypertrophic cardiomyopathy ([place])   . Septic bursitis of elbow 02/03/2013  . Acute blood loss anemia 01/08/2013  . Diabetes mellitus type 2, noninsulin dependent ([place])   . Cellulitis 12/31/2012  . Fasting hyperglycemia 12/31/2012  . Leukocytosis 12/31/2012  . Hypotension 12/31/2012    Palliative Care Assessment & Plan   Patient Profile: 74 y.o. female Patient with Stage IV breast cancer, currently s/p one round of nivolumab, has had radiation tx to spine, previously on Ibrance but did not tolerate; CHF with EF 35-40%, atrial fib/flutter- St. Jude ICD in place, HTN, DM2, CKD III. Currently admitted on 03/07/2020 with sepsis secondary to bilateral lower extremity cellulitis. Admission complicated by afib with RVR- intolerant to many cardiac medications.  Palliative consulted for goals of care.  9/3 Clinically worsening. Remains on milrinone and lasix infusions. Patient uncomfortable with dyspnea and wheezing. Creatinine worsening with uremia and tremors.   Assessment: Acute on chronic combined CHF EF 35-40%  Cardiogenic shock Hx of ischemic cardiomyopathy Afib RVR AKI on CKD CAD Sepsis resolved, BLE  cellulitis Metastatic breast cancer with bone mets Lynch syndrome  Recommendations/Plan:  After multiple [place] discussions 04/06/20, transitioned to comfort measures only per patient wishes. Family  understands and respects her wishes. Family understands diagnoses and poor prognosis.  Continue dilaudid infusion for comfort. Continue prn symptom management medications.  ICD deactivated  Comfort feeds per patient/family request  Ongoing spiritual care support  Unrestricted visitor access as patient is nearing EOL. Anticipate she will pass inpatient.    Code Status: DNR/DNI   Code Status Orders  (From admission, onward)         Start     Ordered   03/19/20 1751  Do not attempt resuscitation (DNR)  Continuous       Question Answer Comment  In the event of cardiac or respiratory ARREST Do not call a "code blue"   In the event of cardiac or respiratory ARREST Do not perform Intubation, CPR, defibrillation or ACLS   In the event of cardiac or respiratory ARREST Use medication by any route, position, wound care, and other measures to relive pain and suffering. May use oxygen, suction and manual treatment of airway obstruction as needed for comfort.      03/19/20 1750        Code Status History    Date Active Date Inactive Code Status Order ID Comments User Context   03/18/2020 0254 03/19/2020 1750 Full Code [id number]  Vernelle Emerald, MD ED   12/04/2019 1812 12/11/2019 0423 Full Code [id number]  Mckinley Jewel, MD Inpatient   12/04/2019 1559 12/04/2019 1812 DNR [id number]  Mckinley Jewel, MD ED   12/04/2019 1537 12/04/2019 1559 Full Code [id number]  Mckinley Jewel, MD ED   11/27/2016 1350 01/08/2017 1922 Full Code [id number]  Erma Heritage, PA-C Inpatient   09/17/2015 2117 09/24/2015 1703 Full Code [id number]  Burtis Junes, NP Inpatient   05/11/2015 2245 05/14/2015 1820 Full Code [id number]  Ivor Costa, MD ED   04/19/2015 0837 04/23/2015 1722 Full Code [id number]  [name], MD  Inpatient   04/16/2015 1749 04/19/2015 0837 Full Code [id number]  [name], MD Inpatient   04/13/2015 1709 04/16/2015 1749 Full Code [id number]  Marshell Garfinkel, MD ED   03/31/2015 0604 04/05/2015 2132 Full Code [id number]  Theressa Millard, MD Inpatient   01/06/2013 2344 01/17/2013 1636 Full Code [id number]  [name], MD Inpatient   12/31/2012 1800 01/06/2013 2344 Full Code [id number]  Theodis Blaze, MD ED   Advance Care Planning Activity       Prognosis:   Hours - Days   Discharge Planning:  Anticipate hospital death  Care plan was discussed with grandson, RN  Thank you for allowing the Palliative Medicine Team to assist in the care of this patient.   Total Time 15 Prolonged Time Billed  no      Greater than 50% of this time was spent counseling and coordinating care related to the above assessment and plan.   Ihor Dow, DNP, FNP-C Palliative Medicine Team  Phone: [phone] Fax: [phone]  Please contact Palliative Medicine Team phone at [phone] for questions and concerns.

## 2020-04-08 NOTE — Progress Notes (Signed)
Advanced Heart Failure Rounding Note  PCP-Cardiologist: No primary care provider on file.   Subjective:    Switched to comfort care on 9/3. Dilaudid gtt increased from 2-> 3 this am due to discomfort.  Will awaken and speak to me. Appears more comfortable.    Objective:   Weight Range: 75.8 kg Body mass index is 28.68 kg/m.   Vital Signs:   Temp:  [98.3 F (36.8 C)] 98.3 F (36.8 C) (09/04 2021) Pulse Rate:  [102-106] 102 (09/04 2200) Resp:  [15-17] 15 (09/04 2200) BP: (98)/(67) 98/67 (09/04 2021) SpO2:  [96 %] 96 % (09/04 2200) Last BM Date: 04/04/20  Weight change: Filed Weights   04/03/20 0400 04/05/20 0439 04/06/20 0500  Weight: 75.7 kg 79 kg 75.8 kg    Intake/Output:  No intake or output data in the 24 hours ending 04/08/20 1144    Physical Exam   General:  Sedated but will arouse HEENT: normal Neck: supple. JVP to ear  Cor: PMI nondisplaced. Irr tachy Lungs: clear  Abdomen: soft, nontender, + distended. No hepatosplenomegaly. No bruits or masses. Good bowel sounds. Extremities: no cyanosis, clubbing, rash, 1-2+ edema + [place] Neuro: sedated but will arouse  Telemetry   Not connected  Labs    CBC Recent Labs    04/06/20 0828  WBC 10.0  NEUTROABS 7.6  HGB 9.8*  HCT 31.8*  MCV 88.3  PLT 341   Basic Metabolic Panel Recent Labs    04/06/20 0500 04/06/20 0828  NA 131* 130*  K 5.0 4.9  CL 93* 92*  CO2 23 23  GLUCOSE 143* 91  BUN 81* 80*  CREATININE 2.59* 2.68*  CALCIUM 7.0* 7.0*  MG 1.9  --    Liver Function Tests Recent Labs    04/06/20 0828  AST 24  ALT 25  ALKPHOS 109  BILITOT 1.6*  PROT 6.3*  ALBUMIN 2.8*   No results for input(s): LIPASE, AMYLASE in the last 72 hours. Cardiac Enzymes No results for input(s): CKTOTAL, CKMB, CKMBINDEX, TROPONINI in the last 72 hours.  BNP: BNP (last 3 results) Recent Labs    12/04/19 1324 03/18/20 0229  BNP 581.1* 912.0*    ProBNP (last 3 results) No results for input(s):  PROBNP in the last 8760 hours.   D-Dimer No results for input(s): DDIMER in the last 72 hours. Hemoglobin A1C No results for input(s): HGBA1C in the last 72 hours. Fasting Lipid Panel No results for input(s): CHOL, HDL, LDLCALC, TRIG, CHOLHDL, LDLDIRECT in the last 72 hours. Thyroid Function Tests No results for input(s): TSH, T4TOTAL, T3FREE, THYROIDAB in the last 72 hours.  Invalid input(s): FREET3  Other results:   Imaging    No results found.   Medications:     Scheduled Medications: . glycopyrrolate  0.2 mg Intravenous Q6H  . sodium chloride flush  10-40 mL Intracatheter Q12H    Infusions: . HYDROmorphone 2 mg/hr (04/08/20 0853)  . ondansetron (ZOFRAN) IV      PRN Medications: acetaminophen **OR** acetaminophen, diphenhydrAMINE, glycopyrrolate, HYDROmorphone, HYDROmorphone (DILAUDID) injection, LORazepam, ondansetron **OR** ondansetron (ZOFRAN) IV, ondansetron (ZOFRAN) IV, polyethylene glycol, senna, sodium chloride, sodium chloride flush   Assessment/Plan   1. 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 PYP in the past negative for amyloid. - Echo LVEF of 35-40% with severe asymmetric LVH, mild MR, moderate TR, and severely elevated PASP. - Now end stage. On comfort care.  - On dilaudid at 3. Titrate as needed -  D/w grandson  - Palliative care following  2. Atrial Fibrillation with RVR - Remains in AF  3. Acute on Chronic Kidney Disease - continues to worsen due to ATN - uremic and volume overloaded.  - continue comfort care  4. CAD  - S/p CABG x4 in 11/2016.  - No s/s angina  5. Sepsis Secondary to Bilateral Lower Extremity Cellulitis - initially placed on broad spectrum abx w/ vanc + meropenum. AF. Lactic acid normalized.  Blood cultures NGTD  - has completed course of abx, AF.  - Resolved  6. 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 - Diagnosed in 12/2019. Lynch syndrome  diagnosed by genetic study.  - Follows with Dr. Marin Olp. He has been seeing her on a daily basis and also remains concerned about her prognosis   Length of Stay: [place], MD  04/08/2020, 11:44 AM   Advanced Heart Failure Team Pager [phone] (M-F; 7a - 4p)  Please contact [facility] for night-coverage after hours (4p -7a ) and weekends on [email or web address]

## 2020-04-08 NOTE — Progress Notes (Signed)
Daily Progress Note   Patient Name: [name]       Date: 04/08/2020 DOB: 1945/12/08  Age: 74 y.o. MRN#: [id number] Attending Physician: Shelly Coss, MD Primary Care Physician: Patient, No Pcp Per Admit Date: 03/10/2020  Reason for Consultation/Follow-up: Establishing goals of care  Subjective/GOC: Patient appears slightly uncomfortable this morning with dyspnea/increased work of breathing. This NP gave Dilaudid 0.5mg  bolus via infusion and did seem to provide relief of symptoms. Patient lethargic. Will open eyes to voice but does not engage in conversation. Assisted RN with repositioning. Patient appears comfortable after dilaudid bolus and repositioning.   Noe Gens at bedside. Discussed ongoing comfort focused care plan. Answered questions. [name] has PMT contact information and knows to call with questions or concerns this afternoon. Will ask for chaplain to visit bedside this afternoon.   Discussed with RN. Encouraged discontinuation of cardiac monitor or place on comfort screen so family does not dwell on numbers at the bedside. Transfer orders for med-surg per attending.   Length of Stay: 21  Current Medications: Scheduled Meds:  . glycopyrrolate  0.2 mg Intravenous Q6H  . sodium chloride flush  10-40 mL Intracatheter Q12H    Continuous Infusions: . HYDROmorphone 2 mg/hr (04/08/20 0853)  . ondansetron (ZOFRAN) IV      PRN Meds: acetaminophen **OR** acetaminophen, diphenhydrAMINE, glycopyrrolate, HYDROmorphone, HYDROmorphone (DILAUDID) injection, LORazepam, ondansetron **OR** ondansetron (ZOFRAN) IV, ondansetron (ZOFRAN) IV, polyethylene glycol, senna, sodium chloride, sodium chloride flush  Physical Exam Vitals and nursing note reviewed.  Constitutional:       Appearance: She is ill-appearing.  Cardiovascular:     Rate and Rhythm: Rhythm irregularly irregular.  Pulmonary:     Effort: No tachypnea, accessory muscle usage or respiratory distress.     Breath sounds: Decreased breath sounds and wheezing present.     Comments: Comfortable after dilaudid bolus given Skin:    General: Skin is warm and dry.  Neurological:     Mental Status: She is lethargic.            Vital Signs: BP 98/67 (BP Location: Left Arm)   Pulse (!) 102   Temp 98.3 F (36.8 C) (Oral)   Resp 15   Ht 5\' 4"  (1.626 m)   Wt 75.8 kg   SpO2 96%   BMI 28.68 kg/m  SpO2:  SpO2: 96 % O2 Device: O2 Device: Nasal Cannula O2 Flow Rate: O2 Flow Rate (L/min): 2 L/min  Intake/output summary:  No intake or output data in the 24 hours ending 04/08/20 0914 LBM: Last BM Date: 04/04/20 Baseline Weight: Weight: 77.1 kg Most recent weight: Weight: 75.8 kg       Palliative Assessment/Data: PPS 20%      Patient Active Problem List   Diagnosis Date Noted  . Dyspnea   . Malnutrition of moderate degree 03/27/2020  . 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 Terminal care   . Palliative care by specialist   . Reactive depression   . Sepsis due to cellulitis ([place]) 03/18/2020  . Hypocalcemia 03/18/2020  . Cellulitis of left lower extremity 03/18/2020  . Hyperkalemia 03/18/2020  . Dehydration with hyponatremia 03/18/2020  . Lactic acidosis 03/18/2020  . Family history of breast cancer   . Lynch syndrome   . Genetic testing 01/09/2020  . Breast cancer metastasized to bone ([place]) 12/06/2019  . Goals of care, counseling/discussion 12/06/2019  . HTN (hypertension)   . Chest pain   . Pressure injury of skin 12/05/2016  . 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 NSTEMI (non-ST elevated myocardial infarction) ([place]) 11/27/2016  . Ventricular fibrillation ([place]) 11/27/2016  . Head injury   . ICD (implantable cardioverter-defibrillator) discharge   .  Diabetic neuropathy ([place]) 06/16/2016  . CKD (chronic kidney disease) stage 3, GFR 30-59 ml/min 06/16/2016  . Fatigue 10/30/2015  . Paroxysmal atrial fibrillation with rapid ventricular response ([place]) 10/02/2015  . Acute on chronic systolic CHF (congestive heart failure) ([place]) 10/02/2015  . Atrial flutter ([place]) 09/18/2015  . Chronic anticoagulation-Xarelto 09/18/2015  . Acute on chronic congestive heart failure ([place]) 09/18/2015  . Orthostatic hypotension   . ICD in place- St Jude   . HLD (hyperlipidemia) 05/11/2015  . Syncope 05/11/2015  . Abdominal pain 05/11/2015  . Acute kidney injury ([place])   . Nausea & vomiting   . Hypothyroidism 04/27/2015  . Chronic diastolic CHF (congestive heart failure) ([place])   . Abnormal PFT   . CAD (coronary artery disease) 04/21/2015  . Acute respiratory failure ([place])   . Ventricular tachycardia- Sept 2016- ICD 04/13/2015  . UTI (lower urinary tract infection) 03/31/2015  . Sepsis ([place]) 03/31/2015  . Atrial fibrillation with RVR ([place]) 03/31/2015  . Systolic CHF ([place])   . Hypertrophic cardiomyopathy ([place])   . Septic bursitis of elbow 02/03/2013  . Acute blood loss anemia 01/08/2013  . Diabetes mellitus type 2, noninsulin dependent ([place])   . Cellulitis 12/31/2012  . Fasting hyperglycemia 12/31/2012  . Leukocytosis 12/31/2012  . Hypotension 12/31/2012    Palliative Care Assessment & Plan   Patient Profile: 74 y.o. female Patient with Stage IV breast cancer, currently s/p one round of nivolumab, has had radiation tx to spine, previously on Ibrance but did not tolerate; CHF with EF 35-40%, atrial fib/flutter- St. Jude ICD in place, HTN, DM2, CKD III. Currently admitted on 04/02/2020 with sepsis secondary to bilateral lower extremity cellulitis. Admission complicated by afib with RVR- intolerant to many cardiac medications.  Palliative consulted for goals of care.  9/3 Clinically worsening. Remains on milrinone and lasix infusions. Patient uncomfortable  with dyspnea and wheezing. Creatinine worsening with uremia and tremors. Transitioned to comfort measures on 04/06/20.  Assessment: Acute on chronic combined CHF EF 35-40%  Cardiogenic shock Hx of ischemic cardiomyopathy Afib RVR AKI on CKD CAD Sepsis resolved, BLE cellulitis Metastatic breast cancer with  bone mets Lynch syndrome  Recommendations/Plan:  After multiple [place] discussions 04/06/20, transitioned to comfort measures only per patient wishes. Family understands and respects her wishes. Family understands diagnoses and poor prognosis.  Continue dilaudid infusion for comfort. Continue prn symptom management medications.  ICD deactivated.  Discontinue cardiac monitor.  Comfort feeds per patient/family request.  Ongoing spiritual care support.  Unrestricted visitor access as patient is nearing EOL. Anticipate she will pass inpatient. Transfer to 6N if bed available.  Code Status: DNR/DNI   Code Status Orders  (From admission, onward)         Start     Ordered   03/19/20 1751  Do not attempt resuscitation (DNR)  Continuous       Question Answer Comment  In the event of cardiac or respiratory ARREST Do not call a "code blue"   In the event of cardiac or respiratory ARREST Do not perform Intubation, CPR, defibrillation or ACLS   In the event of cardiac or respiratory ARREST Use medication by any route, position, wound care, and other measures to relive pain and suffering. May use oxygen, suction and manual treatment of airway obstruction as needed for comfort.      03/19/20 1750        Code Status History    Date Active Date Inactive Code Status Order ID Comments User Context   03/18/2020 0254 03/19/2020 1750 Full Code [id number]  Vernelle Emerald, MD ED   12/04/2019 1812 12/11/2019 0423 Full Code [id number]  Mckinley Jewel, MD Inpatient   12/04/2019 1559 12/04/2019 1812 DNR [id number]  Mckinley Jewel, MD ED   12/04/2019 1537 12/04/2019 1559 Full Code [id number]  Mckinley Jewel, MD ED   11/27/2016 1350 01/08/2017 1922 Full Code [id number]  Erma Heritage, PA-C Inpatient   09/17/2015 2117 09/24/2015 1703 Full Code [id number]  Burtis Junes, NP Inpatient   05/11/2015 2245 05/14/2015 1820 Full Code [id number]  Ivor Costa, MD ED   04/19/2015 0837 04/23/2015 1722 Full Code [id number]  [name], MD Inpatient   04/16/2015 1749 04/19/2015 0837 Full Code [id number]  [name], MD Inpatient   04/13/2015 1709 04/16/2015 1749 Full Code [id number]  Marshell Garfinkel, MD ED   03/31/2015 0604 04/05/2015 2132 Full Code [id number]  Theressa Millard, MD Inpatient   01/06/2013 2344 01/17/2013 1636 Full Code [id number]  [name], MD Inpatient   12/31/2012 1800 01/06/2013 2344 Full Code [id number]  Theodis Blaze, MD ED   Advance Care Planning Activity       Prognosis:   Hours - Days   Discharge Planning:  Anticipate hospital death  Care plan was discussed with grandson, RN  Thank you for allowing the Palliative Medicine Team to assist in the care of this patient.   Total Time 15 Prolonged Time Billed  no    Greater than 50% of this time was spent counseling and coordinating care related to the above assessment and plan.   Ihor Dow, DNP, FNP-C Palliative Medicine Team  Phone: [phone] Fax: [phone]  Please contact Palliative Medicine Team phone at [phone] for questions and concerns.

## 2020-04-08 NOTE — Progress Notes (Signed)
PROGRESS NOTE    [name]  UYQ:[id number] DOB: May 08, 1946 DOA: 03/29/2020 PCP: Patient, No Pcp Per   Brief Narrative: Patient is a 74 y.o.femalewith PMH of DM2, HTN, HLD, CAD s/p CABG, 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 fib, ventricular tachycardia status post ICD in situ, breast cancer with bone mets, Lynch syndrome  who presented to the ED on8/14/2021with complaint of shortness of breath, worsening redness on both upper legs with weeping lesions that started gradually after she used dermatome (exfoliative scrub) on her legs.   In the ED, patient was noted to be in A. fib with RVR to 120s, stable blood pressure.   Labs showed WBC count elevated to 24.5, lactic acid 2.2, sodium low at 129, potassium elevated to 5.4, BUN/creatinine 55/2.03, serum bicarb low at 15.  Chest x-ray showed cardiomegaly without overt failure.Patient was admitted to hospital service for sepsis secondary to bilateral lower extremity cellulitis. Hospital course was also complicated and was prolonged by acute exacerbation of CHF/cardiogenic shock.  Heart failure team is following.  After discussion about extensive goals of care, patient wanted to stop aggressive care and pursue comfort care measures.  Palliative care also following.  Currently on full comfort care.  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 After extensive  discussion with patient and family, started on full comfort care.  Acute exacerbation of chronic combined CHF/cardiogenic shock/ Essential hypertension -EF 35-40% with global hypokinesis. -Cardiology  team was following -Patient was placed on Lasix drip and milrinone drip.  But her respiratory status did not improve.  Bilateral lower extremity cellulitis Sepsis -POA -resolved Immunocompromised status -Presented with worsening redness, pain, weeping lesions of both lower extremities. -Antibiotic course completed on 8/22.  A. fib with RVR H/o paroxysmal A. Fib s/p left atrial appendage clip History of V. Tach, ICD in situ.  -Presented with RVR, likely precipitated by sepsis. -She was on amiodarone, Eliquis.   Breast cancer with bone mets, Lynch syndrome Chronic pain -Breast cancer diagnosed in May. -Follows with Dr. Marin Olp  Acute kidney injury on CKD 3a -Kidney function continued to worsen due to ATN -She was uremic and volume overloaded.  CAD s/p CABG HLD -On Eliquis.    Hyponatremia -Remains in the range of 130 to 135.  Diabetes mellitus type 2  -A1c 7.2 on 8/15.  Bladder prolapse -Follows  with urology as an outpatient.  Nutrition Problem: Moderate Malnutrition Etiology: chronic illness, cancer and cancer related treatments      DVT prophylaxis:None Code Status: DNR/comfort care Family Communication: family present at bedside Status is: Inpatient  Remains inpatient appropriate because:Inpatient level of care appropriate due to severity of illness   Dispo:  Patient From: Home  Planned Disposition: Hospital death anticipated      Consultants: Cardiology   Antimicrobials:  Anti-infectives (From admission, onward)   Start     Dose/Rate Route Frequency Ordered Stop   03/20/20 1430  cefTRIAXone (ROCEPHIN) 2 g in sodium chloride 0.9 % 100 mL IVPB        2 g 200 mL/hr over 30 Minutes Intravenous Every 24 hours 03/20/20 1345 03/25/20 1320    03/20/20 0600  vancomycin (VANCOREADY) IVPB 1250 mg/250 mL  Status:  Discontinued        1,250 mg 166.7 mL/hr over 90 Minutes Intravenous Every 48 hours 03/18/20 0230 03/20/20 1345   03/18/20 0330  vancomycin (VANCOREADY) IVPB 1500 mg/300 mL        1,500 mg 150 mL/hr over 120 Minutes Intravenous  Once 03/18/20 0230 03/18/20 0803   03/18/20 0330  meropenem (MERREM) 1 g in sodium chloride 0.9 % 100 mL IVPB  Status:  Discontinued        1 g 200 mL/hr over 30 Minutes Intravenous 2 times daily 03/18/20 0230 03/20/20 1345   03/18/20 0130  piperacillin-tazobactam (ZOSYN) IVPB 3.375 g  Status:  Discontinued        3.375 g 100 mL/hr over 30 Minutes Intravenous  Once 03/18/20 0115 03/18/20 0214      Subjective: Patient seen and examined the bedside this morning.  Currently on full comfort care.  She looks comfortable,not in distress..  A family member was  present at the bedside  Objective: Vitals:   04/07/20 0753 04/07/20 2021 04/07/20 2200 04/08/20 1300  BP: 118/65 98/67  112/66  Pulse: (!) 110 (!) 106 (!) 102 (!) 109  Resp: (!) 8 17 15 20   Temp:  98.3 F (36.8 C)  97.6 F (36.4 C)  TempSrc:  Oral  Axillary  SpO2: 96% 96% 96% 94%  Weight:      Height:       No intake or output data in the 24 hours ending 04/08/20 1302 Filed Weights   04/03/20 0400 04/05/20 0439 04/06/20 0500  Weight: 75.7 kg 79 kg 75.8 kg    Examination:  General exam: Comfortable, not in any kind of distress, on full comfort care   Data Reviewed: I have personally reviewed following labs and imaging studies  CBC: Recent Labs  Lab 04/02/20 0153 04/03/20 0151 04/04/20 0500 04/06/20 0828  WBC 8.5 6.8 9.0 10.0  NEUTROABS 6.8 4.8 8.2* 7.6  HGB 9.4* 9.4* 9.5* 9.8*  HCT 30.8* 31.2* 31.1* 31.8*  MCV 89.8 89.7 88.9 88.3  PLT 279 253 265 803   Basic Metabolic Panel: Recent Labs  Lab 04/02/20 0153 04/02/20 0153 04/03/20 0151 04/04/20 0500 04/05/20 0454 04/06/20 0500 04/06/20 0828  NA 134*   < > 133*  132* 132* 131* 130*  K 3.7   < > 3.8 4.7 4.8 5.0 4.9  CL 98   < > 97* 95* 95* 93* 92*  CO2 23   < > 23 23 24 23 23   GLUCOSE 109*   < > 74 144* 149* 143* 91  BUN 56*   < > 61* 75* 75* 81* 80*  CREATININE 2.07*   < > 2.02* 2.27* 2.35* 2.59* 2.68*  CALCIUM 6.7*   < > 6.9* 6.8* 7.0* 7.0* 7.0*  MG 2.1  --  2.1  --  2.1 1.9  --   PHOS 4.5  --  4.5 4.9*  --   --   --    < > = values in this interval not displayed.   GFR: Estimated Creatinine Clearance: 18.9 mL/min (A) (by C-G formula based on SCr of 2.68 mg/dL (H)). Liver Function Tests: Recent Labs  Lab 04/02/20 0153 04/03/20 0151 04/04/20 0500 04/06/20 2122  AST 20 20 22 24   ALT 21 20 23 25   ALKPHOS 106 106 112 109  BILITOT 1.4* 1.8* 1.5* 1.6*  PROT 5.5* 5.4* 6.1* 6.3*  ALBUMIN 2.4* 2.3* 2.7* 2.8*   No results for input(s): LIPASE, AMYLASE in the last 168 hours. Recent Labs  Lab 04/06/20 0828  AMMONIA 45*   Coagulation Profile: No results for input(s): INR, PROTIME in the last 168 hours. Cardiac Enzymes: No results for input(s): CKTOTAL, CKMB, CKMBINDEX, TROPONINI in the last 168 hours. BNP (last 3 results) No results for input(s): PROBNP in the last 8760 hours. HbA1C: No results for input(s): HGBA1C in the last 72 hours. CBG: Recent Labs  Lab 04/05/20 1238 04/05/20 1700 04/05/20 2057 04/06/20 0603 04/06/20 1159  GLUCAP 79 141* 187* 96 103*   Lipid Profile: No results for input(s): CHOL, HDL, LDLCALC, TRIG, CHOLHDL, LDLDIRECT in the last 72 hours. Thyroid Function Tests: No results for input(s): TSH, T4TOTAL, FREET4, T3FREE, THYROIDAB in the last 72 hours. Anemia Panel: No results for input(s): VITAMINB12, FOLATE, FERRITIN, TIBC, IRON, RETICCTPCT in the last 72 hours. Sepsis Labs: No results for input(s): PROCALCITON, LATICACIDVEN in the last 168 hours.  No results found for this or any previous visit (from the past 240 hour(s)).       Radiology Studies: No results found.      Scheduled Meds: .  glycopyrrolate  0.2 mg Intravenous Q6H  . sodium chloride flush  10-40 mL Intracatheter Q12H   Continuous Infusions: . HYDROmorphone 3 mg/hr (04/08/20 1150)  . ondansetron (ZOFRAN) IV       LOS: 21 days    Time spent: 15 mins,     Shelly Coss, MD [facility] P9/12/2019, 1:02 PM

## 2020-04-08 NOTE — Consult Note (Signed)
Responded to consult, conferred with pt's nurse, visited/prayed with pt and her grandson bedside--which was much appreciated. Pt is [place]. Let them know they could ask nurse to call for a chaplain at any time they have further need of chaplain services.  Rev. Eloise Levels Chaplain

## 2020-04-08 NOTE — Plan of Care (Signed)
Pt comfort care, family by bedside. Will continue to monitor.

## 2020-04-09 NOTE — Progress Notes (Addendum)
Daily Progress Note   Patient Name: [name]       Date: 04/09/2020 DOB: 19-Jan-1946  Age: 74 y.o. MRN#: [id number] Attending Physician: Shelly Coss, MD Primary Care Physician: Patient, No Pcp Per Admit Date: 03/14/2020  Reason for Consultation/Follow-up: Establishing goals of care  Subjective/GOC: Patient appears comfortable this AM on continuous dilaudid infusion 3mg /hr. Slow, shallow respirations. No s/s of pain or discomfort.   Nursing staff at bedside for bath and repositioning.   [name] at bedside. Discussed plan of care for comfort. He spent the night and explained that she did have nausea/vomiting following soda intake. Symptoms relieved from prn zofran and ativan. She was able to rest after medications given. Answered questions. Other family is on the way this afternoon. Reminded nephew of chaplain support if needed. PMT contact information left at bedside.   ADDENDUM 1655: Spoke with grandson, [name]. Discussed transfer to 6N for EOL care for larger room and peaceful environment. [name] agreeable with this plan. I did mention Dr. Clayborne Dana recommendation for hospice facility. [name] would prefer not to transfer her outside of the hospital if possible. Explained that we will continue to monitor daily. Discussed symptom management medications.   Length of Stay: 22  Current Medications: Scheduled Meds:   glycopyrrolate  0.2 mg Intravenous Q6H   sodium chloride flush  10-40 mL Intracatheter Q12H    Continuous Infusions:  HYDROmorphone 3 mg/hr (04/09/20 0404)   ondansetron (ZOFRAN) IV      PRN Meds: acetaminophen **OR** acetaminophen, diphenhydrAMINE, glycopyrrolate, HYDROmorphone, HYDROmorphone (DILAUDID) injection, LORazepam, ondansetron **OR** ondansetron  (ZOFRAN) IV, ondansetron (ZOFRAN) IV, polyethylene glycol, senna, sodium chloride, sodium chloride flush  Physical Exam Vitals and nursing note reviewed.  Constitutional:      Appearance: She is ill-appearing.  Cardiovascular:     Rate and Rhythm: Rhythm irregularly irregular.  Pulmonary:     Effort: No tachypnea, accessory muscle usage or respiratory distress.     Breath sounds: Decreased breath sounds present.  Skin:    General: Skin is warm and dry.  Neurological:     Mental Status: She is lethargic.            Vital Signs: BP 109/60 (BP Location: Left Arm)    Pulse (!) 107    Temp 99.4 F (37.4 C) (Axillary)    Resp  18    Ht 5\' 4"  (1.626 m)    Wt 75.8 kg    SpO2 92%    BMI 28.68 kg/m  SpO2: SpO2: 92 % O2 Device: O2 Device: Nasal Cannula O2 Flow Rate: O2 Flow Rate (L/min): 2 L/min  Intake/output summary:  No intake or output data in the 24 hours ending 04/09/20 1055 LBM: Last BM Date: 04/04/20 Baseline Weight: Weight: 77.1 kg Most recent weight: Weight: 75.8 kg       Palliative Assessment/Data: PPS 20%      Patient Active Problem List   Diagnosis Date Noted   Dyspnea    Malnutrition of moderate degree 03/27/2020   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 03/22/2020   Terminal care    Palliative care by specialist    Reactive depression    Sepsis due to cellulitis ([place]) 03/18/2020   Hypocalcemia 03/18/2020   Cellulitis of left lower extremity 03/18/2020   Hyperkalemia 03/18/2020   Dehydration with hyponatremia 03/18/2020   Lactic acidosis 03/18/2020   Family history of breast cancer    Lynch syndrome    Genetic testing 01/09/2020   Breast cancer metastasized to bone ([place]) 12/06/2019   Goals of care, counseling/discussion 12/06/2019   HTN (hypertension)    Chest pain    Pressure injury of skin 12/05/2016   S/P CABG x 5 12/04/2016   NSTEMI (non-ST elevated myocardial infarction) ([place]) 11/27/2016    Ventricular fibrillation ([place]) 11/27/2016   Head injury    ICD (implantable cardioverter-defibrillator) discharge    Diabetic neuropathy ([place]) 06/16/2016   CKD (chronic kidney disease) stage 3, GFR 30-59 ml/min 06/16/2016   Fatigue 10/30/2015   Paroxysmal atrial fibrillation with rapid ventricular response ([place]) 10/02/2015   Acute on chronic systolic CHF (congestive heart failure) ([place]) 10/02/2015   Atrial flutter ([place]) 09/18/2015   Chronic anticoagulation-Xarelto 09/18/2015   Acute on chronic congestive heart failure ([place]) 09/18/2015   Orthostatic hypotension    ICD in place- St Jude    HLD (hyperlipidemia) 05/11/2015   Syncope 05/11/2015   Abdominal pain 05/11/2015   Acute kidney injury ([place])    Nausea & vomiting    Hypothyroidism 04/27/2015   Chronic diastolic CHF (congestive heart failure) ([name])    Abnormal PFT    CAD (coronary artery disease) 04/21/2015   Acute respiratory failure ([name])    Ventricular tachycardia- Sept 2016- ICD 04/13/2015   UTI (lower urinary tract infection) 03/31/2015   Sepsis ([place]) 03/31/2015   Atrial fibrillation with RVR ([place]) 37/62/8315   Systolic CHF ([name])    Hypertrophic cardiomyopathy ([place])    Septic bursitis of elbow 02/03/2013   Acute blood loss anemia 01/08/2013   Diabetes mellitus type 2, noninsulin dependent ([place])    Cellulitis 12/31/2012   Fasting hyperglycemia 12/31/2012   Leukocytosis 12/31/2012   Hypotension 12/31/2012    Palliative Care Assessment & Plan   Patient Profile: 74 y.o. female Patient with Stage IV breast cancer, currently s/p one round of nivolumab, has had radiation tx to spine, previously on Ibrance but did not tolerate; CHF with EF 35-40%, atrial fib/flutter- St. Jude ICD in place, HTN, DM2, CKD III. Currently admitted on 03/13/2020 with sepsis secondary to bilateral lower extremity cellulitis. Admission complicated by afib with RVR- intolerant to many cardiac medications.  Palliative  consulted for goals of care.  9/3 Clinically worsening. Remains on milrinone and lasix infusions. Patient uncomfortable with dyspnea and wheezing. Creatinine worsening with uremia and tremors. Transitioned to comfort measures on 04/06/20.  Assessment:  Acute on chronic combined CHF EF 35-40%  Cardiogenic shock Hx of ischemic cardiomyopathy Afib RVR AKI on CKD CAD Sepsis resolved, BLE cellulitis Metastatic breast cancer with bone mets Lynch syndrome  Recommendations/Plan:  After multiple [place] discussions 04/06/20, transitioned to comfort measures only per patient wishes. Family understands and respects her wishes. Family understands diagnoses and poor prognosis.  Continue dilaudid infusion for comfort. Continue prn symptom management medications.  ICD deactivated.  Discontinue cardiac monitor.  Comfort feeds per patient/family request.  Ongoing spiritual care support.  Unrestricted visitor access as patient is nearing EOL. Anticipate she will pass inpatient. Transfer to 6N if bed available.  Code Status: DNR/DNI   Code Status Orders  (From admission, onward)         Start     Ordered   03/19/20 1751  Do not attempt resuscitation (DNR)  Continuous       Question Answer Comment  In the event of cardiac or respiratory ARREST Do not call a code blue   In the event of cardiac or respiratory ARREST Do not perform Intubation, CPR, defibrillation or ACLS   In the event of cardiac or respiratory ARREST Use medication by any route, position, wound care, and other measures to relive pain and suffering. May use oxygen, suction and manual treatment of airway obstruction as needed for comfort.      03/19/20 1750        Code Status History    Date Active Date Inactive Code Status Order ID Comments User Context   03/18/2020 0254 03/19/2020 1750 Full Code [id number]  Vernelle Emerald, MD ED   12/04/2019 1812 12/11/2019 0423 Full Code [id number]  Mckinley Jewel, MD Inpatient   12/04/2019  1559 12/04/2019 1812 DNR [id number]  Mckinley Jewel, MD ED   12/04/2019 1537 12/04/2019 1559 Full Code [id number]  Mckinley Jewel, MD ED   11/27/2016 1350 01/08/2017 1922 Full Code [id number]  Erma Heritage, PA-C Inpatient   09/17/2015 2117 09/24/2015 1703 Full Code [id number]  Burtis Junes, NP Inpatient   05/11/2015 2245 05/14/2015 1820 Full Code [id number]  Ivor Costa, MD ED   04/19/2015 0837 04/23/2015 1722 Full Code [id number]  [name], MD Inpatient   04/16/2015 1749 04/19/2015 0837 Full Code [id number]  [name], MD Inpatient   04/13/2015 1709 04/16/2015 1749 Full Code [id number]  Marshell Garfinkel, MD ED   03/31/2015 0604 04/05/2015 2132 Full Code [id number]  Theressa Millard, MD Inpatient   01/06/2013 2344 01/17/2013 1636 Full Code [id number]  [name], MD Inpatient   12/31/2012 1800 01/06/2013 2344 Full Code [id number]  Theodis Blaze, MD ED   Advance Care Planning Activity       Prognosis:   Hours - Days   Discharge Planning:  Anticipate hospital death  Care plan was discussed with RN, nephew at bedside  Thank you for allowing the Palliative Medicine Team to assist in the care of this patient.   Total Time 15 Prolonged Time Billed  no    Greater than 50% of this time was spent counseling and coordinating care related to the above assessment and plan.   Ihor Dow, DNP, FNP-C Palliative Medicine Team  Phone: [phone] Fax: [phone]  Please contact Palliative Medicine Team phone at [phone] for questions and concerns.

## 2020-04-09 NOTE — Progress Notes (Signed)
PROGRESS NOTE    [name]  BPZ:[id number] DOB: [birth year] DOA: 03/14/2020 PCP: Patient, No Pcp Per   Brief Narrative: Patient is a 74 y.o.femalewith PMH of DM2, HTN, HLD, CAD s/p CABG, 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 fib, ventricular tachycardia status post ICD in situ, breast cancer with bone mets, Lynch syndrome  who presented to the ED on8/14/2021with complaint of shortness of breath, worsening redness on both upper legs with weeping lesions that started gradually after she used dermatome (exfoliative scrub) on her legs.   In the ED, patient was noted to be in A. fib with RVR to 120s, stable blood pressure.   Labs showed WBC count elevated to 24.5, lactic acid 2.2, sodium low at 129, potassium elevated to 5.4, BUN/creatinine 55/2.03, serum bicarb low at 15.  Chest x-ray showed cardiomegaly without overt failure.Patient was admitted to hospital service for sepsis secondary to bilateral lower extremity cellulitis. Hospital course was also complicated and was prolonged by acute exacerbation of CHF/cardiogenic shock.  Heart failure team is following.  After discussion about extensive goals of care, patient wanted to stop aggressive care and pursue comfort care measures.  Palliative care  following.  Currently on full comfort care.  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 After extensive discussion  with patient and family, started on full comfort care.  Acute exacerbation of chronic combined CHF/cardiogenic shock/ Essential hypertension -EF 35-40% with global hypokinesis. -Cardiology  team was following -Patient was placed on Lasix drip and milrinone drip.  But her respiratory status did not improve.  Bilateral lower extremity cellulitis Sepsis -POA -resolved Immunocompromised status -Presented with worsening redness, pain, weeping lesions of both lower extremities. -Antibiotic course completed on 8/22.  A. fib with RVR H/o paroxysmal A. Fib s/p left atrial appendage clip History of V. Tach, ICD in situ.  -Presented with RVR, likely precipitated by sepsis. -She was on amiodarone, Eliquis.   Breast cancer with bone mets, Lynch syndrome Chronic pain -Breast cancer diagnosed in May. -Follows with Dr. Marin Olp  Acute kidney injury on CKD 3a -Kidney function continued to worsen due to ATN -She was uremic and volume overloaded.  CAD s/p CABG HLD -On Eliquis.    Hyponatremia -Remains in the range of 130 to 135.  Diabetes mellitus type 2  -A1c 7.2 on 8/15.  Bladder prolapse -Follows  with urology as an outpatient.  Nutrition Problem: Moderate Malnutrition Etiology: chronic illness, cancer and cancer related treatments      DVT prophylaxis:None Code Status: DNR/comfort care Family Communication: a family was present at bedside Status is: Inpatient  Remains inpatient appropriate because:Inpatient level of care appropriate due to severity of illness   Dispo:  Patient From: Home  Planned Disposition: Hospital death anticipated      Consultants: Cardiology   Antimicrobials:  Anti-infectives (From admission, onward)   Start     Dose/Rate Route Frequency Ordered Stop   03/20/20  1430  cefTRIAXone (ROCEPHIN) 2 g in sodium chloride 0.9 % 100 mL IVPB        2 g 200 mL/hr over 30 Minutes Intravenous Every 24 hours 03/20/20 1345 03/25/20 1320   03/20/20  0600  vancomycin (VANCOREADY) IVPB 1250 mg/250 mL  Status:  Discontinued        1,250 mg 166.7 mL/hr over 90 Minutes Intravenous Every 48 hours 03/18/20 0230 03/20/20 1345   03/18/20 0330  vancomycin (VANCOREADY) IVPB 1500 mg/300 mL        1,500 mg 150 mL/hr over 120 Minutes Intravenous  Once 03/18/20 0230 03/18/20 0803   03/18/20 0330  meropenem (MERREM) 1 g in sodium chloride 0.9 % 100 mL IVPB  Status:  Discontinued        1 g 200 mL/hr over 30 Minutes Intravenous 2 times daily 03/18/20 0230 03/20/20 1345   03/18/20 0130  piperacillin-tazobactam (ZOSYN) IVPB 3.375 g  Status:  Discontinued        3.375 g 100 mL/hr over 30 Minutes Intravenous  Once 03/18/20 0115 03/18/20 0214      Subjective: Patient seen at the bedside this morning.  Family was made was present at the bedside.  She did not look in any kind of distress, on full comfort care..  Objective: Vitals:   04/08/20 1300 04/08/20 1948 04/08/20 1953 04/09/20 0917  BP: 112/66 (!) 97/52 (!) 103/55 109/60  Pulse: (!) 109 (!) 107  (!) 107  Resp: 20 14  18   Temp: 97.6 F (36.4 C) 98.1 F (36.7 C)  99.4 F (37.4 C)  TempSrc: Axillary Oral  Axillary  SpO2: 94% 96%  92%  Weight:      Height:       No intake or output data in the 24 hours ending 04/09/20 1140 Filed Weights   04/03/20 0400 04/05/20 0439 04/06/20 0500  Weight: 75.7 kg 79 kg 75.8 kg    Examination:  General exam: Comfortable, not in any kind of distress, on full comfort care   Data Reviewed: I have personally reviewed following labs and imaging studies  CBC: Recent Labs  Lab 04/03/20 0151 04/04/20 0500 04/06/20 0828  WBC 6.8 9.0 10.0  NEUTROABS 4.8 8.2* 7.6  HGB 9.4* 9.5* 9.8*  HCT 31.2* 31.1* 31.8*  MCV 89.7 88.9 88.3  PLT 253 265 294   Basic Metabolic Panel: Recent Labs  Lab 04/03/20 0151 04/04/20 0500 04/05/20 0454 04/06/20 0500 04/06/20 0828  NA 133* 132* 132* 131* 130*  K 3.8 4.7 4.8 5.0 4.9  CL 97* 95* 95* 93* 92*  CO2 23 23 24 23  23   GLUCOSE 74 144* 149* 143* 91  BUN 61* 75* 75* 81* 80*  CREATININE 2.02* 2.27* 2.35* 2.59* 2.68*  CALCIUM 6.9* 6.8* 7.0* 7.0* 7.0*  MG 2.1  --  2.1 1.9  --   PHOS 4.5 4.9*  --   --   --    GFR: Estimated Creatinine Clearance: 18.9 mL/min (A) (by C-G formula based on SCr of 2.68 mg/dL (H)). Liver Function Tests: Recent Labs  Lab 04/03/20 0151 04/04/20 0500 04/06/20 0828  AST 20 22 24   ALT 20 23 25   ALKPHOS 106 112 109  BILITOT 1.8* 1.5* 1.6*  PROT 5.4* 6.1* 6.3*  ALBUMIN 2.3* 2.7* 2.8*   No results for input(s): LIPASE, AMYLASE in the last 168 hours. Recent Labs  Lab 04/06/20 0828  AMMONIA 45*   Coagulation Profile: No results for input(s): INR, PROTIME in the last 168 hours. Cardiac  Enzymes: No results for input(s): CKTOTAL, CKMB, CKMBINDEX, TROPONINI in the last 168 hours. BNP (last 3 results) No results for input(s): PROBNP in the last 8760 hours. HbA1C: No results for input(s): HGBA1C in the last 72 hours. CBG: Recent Labs  Lab 04/05/20 1238 04/05/20 1700 04/05/20 2057 04/06/20 0603 04/06/20 1159  GLUCAP 79 141* 187* 96 103*   Lipid Profile: No results for input(s): CHOL, HDL, LDLCALC, TRIG, CHOLHDL, LDLDIRECT in the last 72 hours. Thyroid Function Tests: No results for input(s): TSH, T4TOTAL, FREET4, T3FREE, THYROIDAB in the last 72 hours. Anemia Panel: No results for input(s): VITAMINB12, FOLATE, FERRITIN, TIBC, IRON, RETICCTPCT in the last 72 hours. Sepsis Labs: No results for input(s): PROCALCITON, LATICACIDVEN in the last 168 hours.  No results found for this or any previous visit (from the past 240 hour(s)).       Radiology Studies: No results found.      Scheduled Meds: . glycopyrrolate  0.2 mg Intravenous Q6H  . sodium chloride flush  10-40 mL Intracatheter Q12H   Continuous Infusions: . HYDROmorphone 3 mg/hr (04/09/20 0404)  . ondansetron (ZOFRAN) IV       LOS: 22 days    Time spent: 15 mins,     Shelly Coss,  MD [facility] P9/01/2020, 11:40 AM

## 2020-04-09 NOTE — Progress Notes (Signed)
Advanced Heart Failure Rounding Note  PCP-Cardiologist: No primary care provider on file.   Subjective:    On dilaudid drip at 3. Will arouse off/on. Recognizes me. More comfortable. Denies SOB/pain.   Objective:   Weight Range: 75.8 kg Body mass index is 28.68 kg/m.   Vital Signs:   Temp:  [97.6 F (36.4 C)-99.4 F (37.4 C)] 99.4 F (37.4 C) (09/06 0917) Pulse Rate:  [107-109] 107 (09/06 0917) Resp:  [14-20] 18 (09/06 0917) BP: (97-112)/(52-66) 109/60 (09/06 0917) SpO2:  [92 %-96 %] 92 % (09/06 0917) Last BM Date: 04/04/20  Weight change: Filed Weights   04/03/20 0400 04/05/20 0439 04/06/20 0500  Weight: 75.7 kg 79 kg 75.8 kg    Intake/Output:  No intake or output data in the 24 hours ending 04/09/20 1056    Physical Exam   General:  Sedated but will arouse HEENT: normal Neck: supple. JVP to jaw  Cor: PMI nondisplaced. Irreg tachy Lungs: clear Abdomen: soft, nontender, nondistended. No hepatosplenomegaly. No bruits or masses. Good bowel sounds. Extremities: no cyanosis, clubbing, rash, 2-3+ edema + UNNA boots General:  Sedated but will arouse   Telemetry   Not connected  Labs    CBC No results for input(s): WBC, NEUTROABS, HGB, HCT, MCV, PLT in the last 72 hours. Basic Metabolic Panel No results for input(s): NA, K, CL, CO2, GLUCOSE, BUN, CREATININE, CALCIUM, MG, PHOS in the last 72 hours. Liver Function Tests No results for input(s): AST, ALT, ALKPHOS, BILITOT, PROT, ALBUMIN in the last 72 hours. No results for input(s): LIPASE, AMYLASE in the last 72 hours. Cardiac Enzymes No results for input(s): CKTOTAL, CKMB, CKMBINDEX, TROPONINI in the last 72 hours.  BNP: BNP (last 3 results) Recent Labs    12/04/19 1324 03/18/20 0229  BNP 581.1* 912.0*    ProBNP (last 3 results) No results for input(s): PROBNP in the last 8760 hours.   D-Dimer No results for input(s): DDIMER in the last 72 hours. Hemoglobin A1C No results for input(s):  HGBA1C in the last 72 hours. Fasting Lipid Panel No results for input(s): CHOL, HDL, LDLCALC, TRIG, CHOLHDL, LDLDIRECT in the last 72 hours. Thyroid Function Tests No results for input(s): TSH, T4TOTAL, T3FREE, THYROIDAB in the last 72 hours.  Invalid input(s): FREET3  Other results:   Imaging    No results found.   Medications:     Scheduled Medications: . glycopyrrolate  0.2 mg Intravenous Q6H  . sodium chloride flush  10-40 mL Intracatheter Q12H    Infusions: . HYDROmorphone 3 mg/hr (04/09/20 0404)  . ondansetron (ZOFRAN) IV      PRN Medications: acetaminophen **OR** acetaminophen, diphenhydrAMINE, glycopyrrolate, HYDROmorphone, HYDROmorphone (DILAUDID) injection, LORazepam, ondansetron **OR** ondansetron (ZOFRAN) IV, ondansetron (ZOFRAN) IV, polyethylene glycol, senna, sodium chloride, sodium chloride flush   Assessment/Plan   1. 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 PYP in the past negative for amyloid. - Echo LVEF of 35-40% with severe asymmetric LVH, mild MR, moderate TR, and severely elevated PASP. - Now end stage. On comfort care.  - On dilaudid at 3. Titrate as needed - D/w family at bedside. - Palliative care following. Transfer to [employer] [facility]?   2. Atrial Fibrillation with RVR - Remains in AF  3. Acute on Chronic Kidney Disease - uremic and volume overloaded.  - continue comfort care  4. CAD  - S/p CABG x4 in 11/2016.  - No s/s angina  5. Sepsis Secondary to Bilateral Lower  Extremity Cellulitis - initially placed on broad spectrum abx w/ vanc + meropenum. AF. Lactic acid normalized.  Blood cultures NGTD  - has completed course of abx, AF.  - Resolved  6. 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 - Diagnosed in 12/2019. Lynch syndrome diagnosed by genetic study.  - Follows with Dr. Marin Olp. He has been seeing her on a daily basis and also remains concerned about her  prognosis   Length of Stay: [place], MD  04/09/2020, 10:56 AM   Advanced Heart Failure Team Pager [phone] (M-F; 7a - 4p)  Please contact [facility] for night-coverage after hours (4p -7a ) and weekends on [email or web address]

## 2020-04-09 NOTE — Progress Notes (Signed)
Received patient as transfer from 4E. Patient resting comfortably in bed. Family at bedside.

## 2020-04-10 ENCOUNTER — Encounter: Payer: Self-pay | Admitting: *Deleted

## 2020-04-10 MED ORDER — SODIUM CHLORIDE 0.9 % IV SOLN
5.0000 mg/h | INTRAVENOUS | Status: DC
  Administered 2020-04-10 (×2): 3 mg/h via INTRAVENOUS
  Administered 2020-04-11 – 2020-04-13 (×4): 4 mg/h via INTRAVENOUS
  Administered 2020-04-13: 3 mg/h via INTRAVENOUS
  Administered 2020-04-14: 4 mg/h via INTRAVENOUS
  Administered 2020-04-15: 6 mg/h via INTRAVENOUS
  Administered 2020-04-15: 4.5 mg/h via INTRAVENOUS
  Administered 2020-04-15: 5 mg/h via INTRAVENOUS
  Administered 2020-04-16: 6 mg/h via INTRAVENOUS
  Filled 2020-04-10 (×22): qty 5

## 2020-04-10 MED ORDER — SODIUM CHLORIDE 0.9 % IV SOLN: 2.0000 mg/h | INTRAVENOUS | Status: DC

## 2020-04-10 NOTE — Progress Notes (Signed)
Palliative Medicine RN Note: Afternoon symptom check.  Pt's sister, grandson, other family at bedside. Pt is awake but confused. She wants her oxygen removed. RN reports agitation r/t [place]. She is currently on hydromorphone infusion at 3mg /hour.  RN gave prn Ativan & Dilaudid bolus, and we removed O2 after teaching family about medications and letting them know that pt will likely take a nap. Family reports she has been wanting to rest but has struggled to do so. We repositioned her, and [name] states "I'm dying." We confirmed to her that she is and that we are going to keep her comfortable.   At her family's request, we left the O2 running and left the [place] on the bed so they can put it back on only if [place] requests it.  When I left, [name] was resting with PAINAD 0, face relaxed, family at bedside, plan for me to follow up tomorrow. [name] was paler when I left with slight cyanosis around her mouth.  Discussed w RN that she can go up on Dilaudid per range order, but that if she suspects pt needs it, give a bolus, as it takes hours to feel a change in basal rate. I am concerned that [name]'s prognosis is very, very short now, as we have removed her O2 and she is showing signs of decreased perfusion.   Kayla Skiff Irma Delancey, RN, BSN, [name] Palliative Medicine Team 04/10/2020 3:40 PM Office [phone]

## 2020-04-10 NOTE — Progress Notes (Signed)
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 fib, ventricular tachycardia status post ICD in situ, breast cancer with bone mets, Lynch syndrome  who presented to the ED on8/14/2021with complaint of shortness of breath, worsening redness on both upper legs with weeping lesions that started gradually after she used dermatome (exfoliative scrub) on her legs.   In the ED, patient was noted to be in A. fib with RVR to 120s, stable blood pressure.   Labs showed WBC count elevated to 24.5, lactic acid 2.2, sodium low at 129, potassium elevated to 5.4, BUN/creatinine 55/2.03, serum bicarb low at 15.  Chest x-ray showed cardiomegaly without overt failure.Patient was admitted to hospital service for sepsis secondary to bilateral lower extremity cellulitis. Hospital course was also complicated and was prolonged by acute exacerbation of CHF/cardiogenic shock.  Heart failure team is following.  After discussion about extensive goals of care, patient wanted to stop aggressive care and pursue comfort care measures.  Palliative care  following.  Currently on full comfort care.  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 After extensive discussion  with patient and family, started on full comfort care.  Acute exacerbation of chronic combined CHF/cardiogenic shock/ Essential hypertension -EF 35-40% with global hypokinesis. -Cardiology  team was following -Patient was placed on Lasix drip and milrinone drip.  But her respiratory status did not improve.  Bilateral lower extremity cellulitis Sepsis -POA -resolved Immunocompromised status -Presented with worsening redness, pain, weeping lesions of both lower extremities. -Antibiotic course completed on 8/22.  A. fib with RVR H/o paroxysmal A. Fib s/p left atrial appendage clip History of V. Tach, ICD in situ.  -Presented with RVR, likely precipitated by sepsis. -She was on amiodarone, Eliquis.   Breast cancer with bone mets, Lynch syndrome Chronic pain -Breast cancer diagnosed in May. -Follows with Dr. Marin Olp  Acute kidney injury on CKD 3a -Kidney function continued to worsen due to ATN -She was uremic and volume overloaded.  CAD s/p CABG HLD -On Eliquis.    Hyponatremia -Remains in the range of 130 to 135.  Diabetes mellitus type 2  -A1c 7.2 on 8/15.  Bladder prolapse -Follows  with urology as an outpatient.  Nutrition Problem: Moderate Malnutrition Etiology: chronic illness, cancer and cancer related treatments      DVT prophylaxis:None Code Status: DNR/comfort care Family Communication: family present at bedside Status is: Inpatient  Remains inpatient appropriate because:Inpatient level of care appropriate due to severity of illness   Dispo:  Patient From: Home  Planned Disposition: Hospital death anticipated      Consultants: Cardiology   Antimicrobials:  Anti-infectives (From admission, onward)   Start     Dose/Rate Route Frequency Ordered Stop   03/20/20 1430  cefTRIAXone (ROCEPHIN) 2 g in sodium chloride 0.9 % 100 mL IVPB        2 g 200 mL/hr over 30 Minutes Intravenous Every 24 hours 03/20/20 1345 03/25/20 1320   03/20/20 0600   vancomycin (VANCOREADY) IVPB 1250 mg/250 mL  Status:  Discontinued        1,250 mg 166.7 mL/hr over 90 Minutes Intravenous Every 48 hours 03/18/20 0230 03/20/20 1345   03/18/20 0330  vancomycin (VANCOREADY) IVPB 1500 mg/300 mL        1,500 mg 150 mL/hr over 120 Minutes Intravenous  Once 03/18/20 0230 03/18/20 0803   03/18/20 0330  meropenem (MERREM) 1 g in sodium chloride 0.9 % 100 mL IVPB  Status:  Discontinued        1 g 200 mL/hr over 30 Minutes Intravenous 2 times daily 03/18/20 0230 03/20/20 1345   03/18/20 0130  piperacillin-tazobactam (ZOSYN) IVPB 3.375 g  Status:  Discontinued        3.375 g 100 mL/hr over 30 Minutes Intravenous  Once 03/18/20 0115 03/18/20 0214      Subjective: Patient seen and examined the bedside this morning.  Family members are present at the bedside.  Patient was actually alert and awake and was hugging one of the family members.  Very comfortable  Objective: Vitals:   04/08/20 1953 04/09/20 0917 04/09/20 1746 04/10/20 0459  BP: (!) 103/55 109/60 (!) 106/58 113/66  Pulse:  (!) 107 (!) 106 74  Resp:  18 10 17   Temp:  99.4 F (37.4 C) 99.2 F (37.3 C) (!) 97.5 F (36.4 C)  TempSrc:  Axillary Axillary Oral  SpO2:  92% 97% 99%  Weight:      Height:        Intake/Output Summary (Last 24 hours) at 04/10/2020 1258 Last data filed at 04/10/2020 0800 Gross per 24 hour  Intake 135 ml  Output --  Net 135 ml   Filed Weights   04/03/20 0400 04/05/20 0439 04/06/20 0500  Weight: 75.7 kg 79 kg 75.8 kg    Examination:  General exam: Comfortable, not in any kind of distress   Data Reviewed: I have personally reviewed following labs and imaging studies  CBC: Recent Labs  Lab 04/04/20 0500 04/06/20 0828  WBC 9.0 10.0  NEUTROABS 8.2* 7.6  HGB 9.5* 9.8*  HCT 31.1* 31.8*  MCV 88.9 88.3  PLT 265 419   Basic Metabolic Panel: Recent Labs  Lab 04/04/20 0500 04/05/20 0454 04/06/20 0500 04/06/20 0828  NA 132* 132* 131* 130*  K 4.7 4.8 5.0 4.9   CL 95* 95* 93* 92*  CO2 23 24 23 23   GLUCOSE 144* 149* 143* 91  BUN 75* 75* 81* 80*  CREATININE 2.27* 2.35* 2.59* 2.68*  CALCIUM 6.8* 7.0* 7.0* 7.0*  MG  --  2.1 1.9  --   PHOS 4.9*  --   --   --    GFR: Estimated Creatinine Clearance: 18.9 mL/min (A) (by C-G formula based on SCr of 2.68 mg/dL (H)). Liver Function Tests: Recent Labs  Lab 04/04/20 0500 04/06/20 0828  AST 22 24  ALT 23 25  ALKPHOS 112 109  BILITOT 1.5* 1.6*  PROT 6.1* 6.3*  ALBUMIN 2.7* 2.8*   No results for input(s): LIPASE, AMYLASE in the last 168 hours. Recent Labs  Lab 04/06/20 0828  AMMONIA 45*   Coagulation Profile: No results for input(s): INR, PROTIME in the last 168 hours. Cardiac Enzymes: No results for input(s): CKTOTAL, CKMB, CKMBINDEX, TROPONINI in  the last 168 hours. BNP (last 3 results) No results for input(s): PROBNP in the last 8760 hours. HbA1C: No results for input(s): HGBA1C in the last 72 hours. CBG: Recent Labs  Lab 04/05/20 1238 04/05/20 1700 04/05/20 2057 04/06/20 0603 04/06/20 1159  GLUCAP 79 141* 187* 96 103*   Lipid Profile: No results for input(s): CHOL, HDL, LDLCALC, TRIG, CHOLHDL, LDLDIRECT in the last 72 hours. Thyroid Function Tests: No results for input(s): TSH, T4TOTAL, FREET4, T3FREE, THYROIDAB in the last 72 hours. Anemia Panel: No results for input(s): VITAMINB12, FOLATE, FERRITIN, TIBC, IRON, RETICCTPCT in the last 72 hours. Sepsis Labs: No results for input(s): PROCALCITON, LATICACIDVEN in the last 168 hours.  No results found for this or any previous visit (from the past 240 hour(s)).       Radiology Studies: No results found.      Scheduled Meds: . glycopyrrolate  0.2 mg Intravenous Q6H  . sodium chloride flush  10-40 mL Intracatheter Q12H   Continuous Infusions: . HYDROmorphone 3 mg/hr (04/10/20 1027)  . ondansetron (ZOFRAN) IV       LOS: 23 days    Time spent: 15 mins,     Shelly Coss, MD [facility] P9/02/2020,  12:58 PM

## 2020-04-10 NOTE — Progress Notes (Signed)
Reviewed patient's need for post hospital follow up. Patient is still admitted to [facility] hospital on day 23. She has transitioned to EOL care and will likely not be discharge. If discharged, will go to a hospice facility. No further navigation needs.   Oncology Nurse Navigator Documentation  Oncology Nurse Navigator Flowsheets 04/10/2020  Abnormal Finding Date -  Confirmed Diagnosis Date -  Diagnosis Status -  Phase of Treatment -  Chemotherapy Actual Start Date: -  Radiation Actual Start Date: -  Radiation Actual End Date: -  Targeted Therapy Actual Start Date: -  Navigator Follow Up Date: -  Navigator Follow Up Reason: -  Navigation Complete Date: 04/10/2020  Post Navigation: Continue to Follow Patient? No  Reason Not Navigating Patient: Airline pilot Encounter Type Appt/Treatment Plan Review  Telephone -  Treatment Initiated Date -  Patient Visit Type MedOnc  Treatment Phase Active Tx  Barriers/Navigation Needs Anxiety;Coordination of Care;Family Concerns;Mobility Issues;Pain;Personal Conflicts;Transportation;Education  Education -  Interventions -  Acuity Level 4-High Needs (Greater Than 4 Barriers Identified)  Referrals -  Coordination of Care -  Education Method -  Support Groups/Services Friends and Family  Time Spent with Patient 97

## 2020-04-10 NOTE — Progress Notes (Signed)
Nutrition Brief Note  Chart reviewed. Pt now transitioning to comfort care.  No further nutrition interventions warranted at this time.  Please re-consult as needed.   Enrrique Mierzwa W, RD, LDN, CDCES Registered Dietitian II Certified Diabetes Care and Education Specialist Please refer to AMION for RD and/or RD on-call/weekend/after hours pager  

## 2020-04-11 NOTE — Progress Notes (Signed)
   04/11/20 1451  Clinical Encounter Type  Visited With Patient and family together  Visit Type Follow-up;Spiritual support  Referral From Palliative care team  Consult/Referral To Chaplain  Spiritual Encounters  Spiritual Needs Prayer;Grief support  Stress Factors  Family Stress Factors Loss  This chaplain was pastorally present with Pt. And family for spiritual care.  The Pt. appears to be resting comfortably with brief acknowledgements of the family's communication with her. The Pt. daughter-Madonna, grandson-Andrew, and sister-Anna are bedside.  The chaplain listened and connected the family to stories the Pt. had chosen to share with the chaplain.  The chaplain accepted the invitation to pray with the Pt. and provide F/U spiritual care as needed.

## 2020-04-11 NOTE — Progress Notes (Signed)
Palliative Medicine RN Note: Symptom check. Dr Tawanna Solo was present during part of my visit, as was Dr Haroldine Laws.  Pt is back on O2 via [place]; grandson at bedside reports the family put it back on last night. Her hydromorphone is at 3mg /hour. Family reports she has tolerated small bites of orange and has been awake.  When I arrive, she is using accessory muscles to breathe despite medication and O2. I offered to help reposition, and she agreed. Plan to give Dilaudid bolus then move her w help of RN.  RN gave bolus, and we began to move pt. During turning, she c/o nausea and turned paler. We immediately sat her up, and her nurse brought prn Zofran. She continued to complain of "the worst pain imaginable", so RN gave Ativan as adjuvant. [name] verbalized relief of pain and nausea enough to finish turning her, so we finished repositioning her, and we used multiple pillows for comfort. Once settled, she reported nausea was completely gone. Dilaudid basal rate increased to 4 mg/hour per range dose.  [name]'s grandson had a lot of questions about how much improvement she is showing (I.e. swelling) and keeping her on comfort care. I paged PMT to see if there was an available provider. Vinie Sill, NP, will go by her room later today to answer his questions.  Kayla Skiff Ulyssa Walthour, RN, BSN, [facility] Palliative Medicine Team 04/11/2020 11:32 AM Office [phone]

## 2020-04-11 NOTE — Progress Notes (Signed)
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 fib, ventricular tachycardia status post ICD in situ, breast cancer with bone mets, Lynch syndrome  who presented to the ED on8/14/2021with complaint of shortness of breath, worsening redness on both upper legs with weeping lesions that started gradually after she used dermatome (exfoliative scrub) on her legs.   In the ED, patient was noted to be in A. fib with RVR to 120s, stable blood pressure.   Labs showed WBC count elevated to 24.5, lactic acid 2.2, sodium low at 129, potassium elevated to 5.4, BUN/creatinine 55/2.03, serum bicarb low at 15.  Chest x-ray showed cardiomegaly without overt failure.Patient was admitted to hospital service for sepsis secondary to bilateral lower extremity cellulitis. Hospital course was also complicated and was prolonged by acute exacerbation of CHF/cardiogenic shock.  Heart failure team is following.  After discussion about extensive goals of care, patient wanted to stop aggressive care and pursue comfort care measures.  Palliative care  following.  Currently on full comfort care.  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 After extensive discussion  with patient and family, started on full comfort care.  Acute exacerbation of chronic combined CHF/cardiogenic shock/ Essential hypertension -EF 35-40% with global hypokinesis. -Cardiology  team was following -Patient was placed on Lasix drip and milrinone drip.  But her respiratory status did not improve.  Bilateral lower extremity cellulitis Sepsis -POA -resolved Immunocompromised status -Presented with worsening redness, pain, weeping lesions of both lower extremities. -Antibiotic course completed on 8/22.  A. fib with RVR H/o paroxysmal A. Fib s/p left atrial appendage clip History of V. Tach, ICD in situ.  -Presented with RVR, likely precipitated by sepsis. -She was on amiodarone, Eliquis.   Breast cancer with bone mets, Lynch syndrome Chronic pain -Breast cancer diagnosed in May. -Follows with Dr. Marin Olp  Acute kidney injury on CKD 3a -Kidney function continued to worsen due to ATN -She was uremic and volume overloaded.  CAD s/p CABG HLD -On Eliquis.    Hyponatremia -Remains in the range of 130 to 135.  Diabetes mellitus type 2  -A1c 7.2 on 8/15.  Bladder prolapse -Follows  with urology as an outpatient.  Nutrition Problem: Moderate Malnutrition Etiology: chronic illness, cancer and cancer related treatments      DVT prophylaxis:None Code Status: DNR/comfort care Family Communication: son present at bedside Status is: Inpatient  Remains inpatient appropriate because:Inpatient level of care appropriate due to severity of illness   Dispo:  Patient From: Home  Planned Disposition: Hospital death anticipated      Consultants: Cardiology   Antimicrobials:  Anti-infectives (From admission, onward)   Start     Dose/Rate Route Frequency Ordered Stop   03/20/20 1430  cefTRIAXone (ROCEPHIN) 2 g in sodium chloride 0.9 % 100 mL IVPB        2 g 200 mL/hr over 30 Minutes Intravenous Every 24 hours 03/20/20 1345 03/25/20 1320   03/20/20 0600   vancomycin (VANCOREADY) IVPB 1250 mg/250 mL  Status:  Discontinued        1,250 mg 166.7 mL/hr over 90 Minutes Intravenous Every 48 hours 03/18/20 0230 03/20/20 1345   03/18/20 0330  vancomycin (VANCOREADY) IVPB 1500 mg/300 mL        1,500 mg 150 mL/hr over 120 Minutes Intravenous  Once 03/18/20 0230 03/18/20 0803   03/18/20 0330  meropenem (MERREM) 1 g in sodium chloride 0.9 % 100 mL IVPB  Status:  Discontinued        1 g 200 mL/hr over 30 Minutes Intravenous 2 times daily 03/18/20 0230 03/20/20 1345   03/18/20 0130  piperacillin-tazobactam (ZOSYN) IVPB 3.375 g  Status:  Discontinued        3.375 g 100 mL/hr over 30 Minutes Intravenous  Once 03/18/20 0115 03/18/20 0214      Subjective: Patient seen and examined at the bedside this morning.  Lying on the bed, sleeping, she held my hand when I move near her.  Was not in any obvious distress with pain or respiratory issues.  Son was asking questions about her overall status and I answered them   Objective: Vitals:   04/09/20 0917 04/09/20 1746 04/10/20 0459 04/11/20 0505  BP: 109/60 (!) 106/58 113/66 (!) 101/56  Pulse: (!) 107 (!) 106 74 (!) 105  Resp: 18 10 17 18   Temp: 99.4 F (37.4 C) 99.2 F (37.3 C) (!) 97.5 F (36.4 C) (!) 97.5 F (36.4 C)  TempSrc: Axillary Axillary Oral Oral  SpO2: 92% 97% 99% 94%  Weight:      Height:        Intake/Output Summary (Last 24 hours) at 04/11/2020 1323 Last data filed at 04/11/2020 0444 Gross per 24 hour  Intake 0 ml  Output --  Net 0 ml   Filed Weights   04/03/20 0400 04/05/20 0439 04/06/20 0500  Weight: 75.7 kg 79 kg 75.8 kg    Examination:  General exam: Not in obvious distress, lying on the bed, weak CVS: S1-S2 M0 Resp: No obvious wheezes or crackles Extremities: Trace bilateral lower extremity edema.   Data Reviewed: I have personally reviewed following labs and imaging studies  CBC: Recent Labs  Lab 04/06/20 0828  WBC 10.0  NEUTROABS 7.6  HGB 9.8*  HCT 31.8*  MCV  88.3  PLT 166   Basic Metabolic Panel: Recent Labs  Lab 04/05/20 0454 04/06/20 0500 04/06/20 0828  NA 132* 131* 130*  K 4.8 5.0 4.9  CL 95* 93* 92*  CO2 24 23 23   GLUCOSE 149* 143* 91  BUN 75* 81* 80*  CREATININE 2.35* 2.59* 2.68*  CALCIUM 7.0* 7.0* 7.0*  MG 2.1 1.9  --    GFR: Estimated Creatinine Clearance: 18.9 mL/min (A) (by C-G formula based on SCr of 2.68 mg/dL (H)). Liver Function Tests: Recent Labs  Lab 04/06/20 0828  AST 24  ALT 25  ALKPHOS 109  BILITOT 1.6*  PROT 6.3*  ALBUMIN 2.8*   No results for input(s): LIPASE, AMYLASE in the last 168 hours. Recent Labs  Lab 04/06/20 0828  AMMONIA 45*   Coagulation Profile: No results for input(s): INR, PROTIME in the last 168 hours. Cardiac Enzymes: No results for input(s): CKTOTAL, CKMB, CKMBINDEX, TROPONINI in the last 168  hours. BNP (last 3 results) No results for input(s): PROBNP in the last 8760 hours. HbA1C: No results for input(s): HGBA1C in the last 72 hours. CBG: Recent Labs  Lab 04/05/20 1238 04/05/20 1700 04/05/20 2057 04/06/20 0603 04/06/20 1159  GLUCAP 79 141* 187* 96 103*   Lipid Profile: No results for input(s): CHOL, HDL, LDLCALC, TRIG, CHOLHDL, LDLDIRECT in the last 72 hours. Thyroid Function Tests: No results for input(s): TSH, T4TOTAL, FREET4, T3FREE, THYROIDAB in the last 72 hours. Anemia Panel: No results for input(s): VITAMINB12, FOLATE, FERRITIN, TIBC, IRON, RETICCTPCT in the last 72 hours. Sepsis Labs: No results for input(s): PROCALCITON, LATICACIDVEN in the last 168 hours.  No results found for this or any previous visit (from the past 240 hour(s)).       Radiology Studies: No results found.      Scheduled Meds: . glycopyrrolate  0.2 mg Intravenous Q6H  . sodium chloride flush  10-40 mL Intracatheter Q12H   Continuous Infusions: . HYDROmorphone 4 mg/hr (04/11/20 1045)  . ondansetron (ZOFRAN) IV       LOS: 24 days    Time spent: 15 mins,     Shelly Coss, MD [facility] P9/03/2020, 1:23 PM

## 2020-04-11 NOTE — Progress Notes (Signed)
Advanced Heart Failure Rounding Note  PCP-Cardiologist: No primary care provider on file.   Subjective:    On dilaudid drip at 3. Will wake up and eat at times. Was nauseated earlier this am.   Very weak   Objective:   Weight Range: 75.8 kg Body mass index is 28.68 kg/m.   Vital Signs:   Temp:  [97.5 F (36.4 C)] 97.5 F (36.4 C) (09/08 0505) Pulse Rate:  [105] 105 (09/08 0505) Resp:  [18] 18 (09/08 0505) BP: (101)/(56) 101/56 (09/08 0505) SpO2:  [94 %] 94 % (09/08 0505) Last BM Date: 03/16/20  Weight change: Filed Weights   04/03/20 0400 04/05/20 0439 04/06/20 0500  Weight: 75.7 kg 79 kg 75.8 kg    Intake/Output:   Intake/Output Summary (Last 24 hours) at 04/11/2020 1143 Last data filed at 04/11/2020 0444 Gross per 24 hour  Intake 0 ml  Output --  Net 0 ml      Physical Exam   General:  Weak appearing. Will arouse  No resp difficulty HEENT: normal Neck: supple. JVP to jaw Carotids 2+ bilat; no bruits. No lymphadenopathy or thryomegaly appreciated. Cor: PMI nondisplaced. Irregular rate & rhythm. No rubs, gallops or murmurs. Lungs: clear Abdomen: soft, nontender, nondistended. No hepatosplenomegaly. No bruits or masses. Hypoactive bowel sounds. Extremities: no cyanosis, clubbing, rash, tr edema + UNNA Neuro: lethargic. Will arouse and follow commands   Telemetry   Not connected  Labs    CBC No results for input(s): WBC, NEUTROABS, HGB, HCT, MCV, PLT in the last 72 hours. Basic Metabolic Panel No results for input(s): NA, K, CL, CO2, GLUCOSE, BUN, CREATININE, CALCIUM, MG, PHOS in the last 72 hours. Liver Function Tests No results for input(s): AST, ALT, ALKPHOS, BILITOT, PROT, ALBUMIN in the last 72 hours. No results for input(s): LIPASE, AMYLASE in the last 72 hours. Cardiac Enzymes No results for input(s): CKTOTAL, CKMB, CKMBINDEX, TROPONINI in the last 72 hours.  BNP: BNP (last 3 results) Recent Labs    12/04/19 1324 03/18/20 0229  BNP  581.1* 912.0*    ProBNP (last 3 results) No results for input(s): PROBNP in the last 8760 hours.   D-Dimer No results for input(s): DDIMER in the last 72 hours. Hemoglobin A1C No results for input(s): HGBA1C in the last 72 hours. Fasting Lipid Panel No results for input(s): CHOL, HDL, LDLCALC, TRIG, CHOLHDL, LDLDIRECT in the last 72 hours. Thyroid Function Tests No results for input(s): TSH, T4TOTAL, T3FREE, THYROIDAB in the last 72 hours.  Invalid input(s): FREET3  Other results:   Imaging    No results found.   Medications:     Scheduled Medications: . glycopyrrolate  0.2 mg Intravenous Q6H  . sodium chloride flush  10-40 mL Intracatheter Q12H    Infusions: . HYDROmorphone 4 mg/hr (04/11/20 1045)  . ondansetron (ZOFRAN) IV      PRN Medications: acetaminophen **OR** acetaminophen, diphenhydrAMINE, glycopyrrolate, HYDROmorphone, HYDROmorphone (DILAUDID) injection, LORazepam, ondansetron **OR** ondansetron (ZOFRAN) IV, ondansetron (ZOFRAN) IV, polyethylene glycol, senna, sodium chloride, sodium chloride flush   Assessment/Plan   1. 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 PYP in the past negative for amyloid. - Echo LVEF of 35-40% with severe asymmetric LVH, mild MR, moderate TR, and severely elevated PASP. - Now end stage. On comfort care.  - On dilaudid at 3. Titrate as needed - D/w family at bedside. - Palliative care following. Transfer to [facility]idential [facility]? Discussed with Hospice RN at bedisde  2. Atrial Fibrillation with RVR - Remains in AF  3. Acute on Chronic Kidney Disease - uremic and volume overloaded.  - continue comfort care  4. CAD  - S/p CABG x4 in 11/2016.  - No s/s angina  5. 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 - Diagnosed in 12/2019. Lynch syndrome diagnosed by genetic study.  - Follows with Dr. Marin Olp. He has been seeing her on a daily basis and also remains  concerned about her prognosis  Continue comfort care. Appreciate Palliative Care team's help  Length of Stay: [place], MD  04/11/2020, 11:43 AM   Advanced Heart Failure Team Pager [phone] (M-F; 7a - 4p)  Please contact [facility] for night-coverage after hours (4p -7a ) and weekends on [email or web address]

## 2020-04-11 NOTE — Progress Notes (Signed)
Palliative:  I went to bedside for symptom check and emotional support to grandson. [name] is sleeping comfortably and pain appears well managed. She did not awaken. Grandson not at bedside.   No charge  Vinie Sill, NP Palliative Medicine Team Pager [phone] (Please see [email or web address] for schedule) Team Phone [phone]

## 2020-04-12 MED ORDER — ORAL CARE MOUTH RINSE
15.0000 mL | Freq: Two times a day (BID) | OROMUCOSAL | Status: DC
  Administered 2020-04-12 – 2020-04-15 (×8): 15 mL via OROMUCOSAL

## 2020-04-12 MED ORDER — LORAZEPAM 2 MG/ML IJ SOLN
0.5000 mg | Freq: Every day | INTRAMUSCULAR | Status: DC
  Administered 2020-04-12 – 2020-04-13 (×2): 0.5 mg via INTRAVENOUS
  Filled 2020-04-12 (×2): qty 1

## 2020-04-12 MED ORDER — CHLORHEXIDINE GLUCONATE 0.12 % MT SOLN
15.0000 mL | Freq: Two times a day (BID) | OROMUCOSAL | Status: DC
  Administered 2020-04-12 – 2020-04-15 (×7): 15 mL via OROMUCOSAL
  Filled 2020-04-12 (×7): qty 15

## 2020-04-12 NOTE — Progress Notes (Signed)
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 fib, ventricular tachycardia status post ICD in situ, breast cancer with bone mets, Lynch syndrome  who presented to the ED on8/14/2021with complaint of shortness of breath, worsening redness on both upper legs with weeping lesions that started gradually after she used dermatome (exfoliative scrub) on her legs.   In the ED, patient was noted to be in A. fib with RVR to 120s, stable blood pressure.   Labs showed WBC count elevated to 24.5, lactic acid 2.2, sodium low at 129, potassium elevated to 5.4, BUN/creatinine 55/2.03, serum bicarb low at 15.  Chest x-ray showed cardiomegaly without overt failure.Patient was admitted to hospital service for sepsis secondary to bilateral lower extremity cellulitis. Hospital course was also complicated and was prolonged by acute exacerbation of CHF/cardiogenic shock.  Heart failure team is following.  After discussion about extensive goals of care, patient wanted to stop aggressive care and pursue comfort care measures.  Palliative care  following.  Currently on full comfort care.  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 After extensive discussion  with patient and family, started on full comfort care.  Acute exacerbation of chronic combined CHF/cardiogenic shock/ Essential hypertension -EF 35-40% with global hypokinesis. -Cardiology  team was following -Patient was placed on Lasix drip and milrinone drip.  But her respiratory status did not improve.  Bilateral lower extremity cellulitis Sepsis -POA -resolved Immunocompromised status -Presented with worsening redness, pain, weeping lesions of both lower extremities. -Antibiotic course completed on 8/22.  A. fib with RVR H/o paroxysmal A. Fib s/p left atrial appendage clip History of V. Tach, ICD in situ.  -Presented with RVR, likely precipitated by sepsis. -She was on amiodarone, Eliquis.   Breast cancer with bone mets, Lynch syndrome Chronic pain -Breast cancer diagnosed in May. -Follows with Dr. Marin Olp  Acute kidney injury on CKD 3a -Kidney function continued to worsen due to ATN -She was uremic and volume overloaded.  CAD s/p CABG HLD -On Eliquis.    Hyponatremia -Remains in the range of 130 to 135.  Diabetes mellitus type 2  -A1c 7.2 on 8/15.  Bladder prolapse -Follows  with urology as an outpatient.  Nutrition Problem: Moderate Malnutrition Etiology: chronic illness, cancer and cancer related treatments      DVT prophylaxis:None Code Status: DNR/comfort care Family Communication: grandson present at bedside Status is: Inpatient  Remains inpatient appropriate because:Inpatient level of care appropriate due to severity of illness   Dispo:  Patient From: Home  Planned Disposition: Hospital death anticipated      Consultants: Cardiology   Antimicrobials:  Anti-infectives (From admission, onward)   Start     Dose/Rate Route Frequency Ordered Stop   03/20/20 1430  cefTRIAXone (ROCEPHIN) 2 g in sodium chloride 0.9 % 100 mL IVPB        2 g 200 mL/hr over 30 Minutes Intravenous Every 24 hours 03/20/20 1345 03/25/20 1320   03/20/20 0600   vancomycin (VANCOREADY) IVPB 1250 mg/250 mL  Status:  Discontinued        1,250 mg 166.7 mL/hr over 90 Minutes Intravenous Every 48 hours 03/18/20 0230 03/20/20 1345   03/18/20 0330  vancomycin (VANCOREADY) IVPB 1500 mg/300 mL        1,500 mg 150 mL/hr over 120 Minutes Intravenous  Once 03/18/20 0230 03/18/20 0803   03/18/20 0330  meropenem (MERREM) 1 g in sodium chloride 0.9 % 100 mL IVPB  Status:  Discontinued        1 g 200 mL/hr over 30 Minutes Intravenous 2 times daily 03/18/20 0230 03/20/20 1345   03/18/20 0130  piperacillin-tazobactam (ZOSYN) IVPB 3.375 g  Status:  Discontinued        3.375 g 100 mL/hr over 30 Minutes Intravenous  Once 03/18/20 0115 03/18/20 0214      Subjective: Patient seen and examined the bedside this morning.  Grandson was at the bedside.  She looks comfortable, not in distress.  On Dilaudid drip.   Objective: Vitals:   04/09/20 1746 04/10/20 0459 04/11/20 0505 04/12/20 0543  BP: (!) 106/58 113/66 (!) 101/56 101/62  Pulse: (!) 106 74 (!) 105 (!) 108  Resp: 10 17 18 12   Temp: 99.2 F (37.3 C) (!) 97.5 F (36.4 C) (!) 97.5 F (36.4 C) 98.1 F (36.7 C)  TempSrc: Axillary Oral Oral Oral  SpO2: 97% 99% 94% 93%  Weight:      Height:       No intake or output data in the 24 hours ending 04/12/20 1329 Filed Weights   04/03/20 0400 04/05/20 0439 04/06/20 0500  Weight: 75.7 kg 79 kg 75.8 kg    Examination:  General exam: Not in obvious distress, lying on the bed,sleeping CVS: S1-S2 M0 Resp: No obvious wheezes or crackles Extremities: Trace bilateral lower extremity edema,wrapped with dressing   Data Reviewed: I have personally reviewed following labs and imaging studies  CBC: Recent Labs  Lab 04/06/20 0828  WBC 10.0  NEUTROABS 7.6  HGB 9.8*  HCT 31.8*  MCV 88.3  PLT 732   Basic Metabolic Panel: Recent Labs  Lab 04/06/20 0500 04/06/20 0828  NA 131* 130*  K 5.0 4.9  CL 93* 92*  CO2 23 23  GLUCOSE 143* 91  BUN 81* 80*    CREATININE 2.59* 2.68*  CALCIUM 7.0* 7.0*  MG 1.9  --    GFR: Estimated Creatinine Clearance: 18.9 mL/min (A) (by C-G formula based on SCr of 2.68 mg/dL (H)). Liver Function Tests: Recent Labs  Lab 04/06/20 0828  AST 24  ALT 25  ALKPHOS 109  BILITOT 1.6*  PROT 6.3*  ALBUMIN 2.8*   No results for input(s): LIPASE, AMYLASE in the last 168 hours. Recent Labs  Lab 04/06/20 0828  AMMONIA 45*   Coagulation Profile: No results for input(s): INR, PROTIME in the last 168 hours. Cardiac Enzymes: No results for input(s): CKTOTAL, CKMB, CKMBINDEX, TROPONINI in the last 168 hours. BNP (last 3 results) No results for input(s): PROBNP in the last 8760 hours. HbA1C: No results for input(s): HGBA1C in the last 72 hours. CBG: Recent Labs  Lab 04/05/20 1700 04/05/20 2057 04/06/20 0603 04/06/20 1159  GLUCAP 141* 187* 96 103*   Lipid Profile: No results for input(s):  CHOL, HDL, LDLCALC, TRIG, CHOLHDL, LDLDIRECT in the last 72 hours. Thyroid Function Tests: No results for input(s): TSH, T4TOTAL, FREET4, T3FREE, THYROIDAB in the last 72 hours. Anemia Panel: No results for input(s): VITAMINB12, FOLATE, FERRITIN, TIBC, IRON, RETICCTPCT in the last 72 hours. Sepsis Labs: No results for input(s): PROCALCITON, LATICACIDVEN in the last 168 hours.  No results found for this or any previous visit (from the past 240 hour(s)).       Radiology Studies: No results found.      Scheduled Meds: . chlorhexidine  15 mL Mouth Rinse BID  . glycopyrrolate  0.2 mg Intravenous Q6H  . LORazepam  0.5 mg Intravenous QHS  . mouth rinse  15 mL Mouth Rinse q12n4p  . sodium chloride flush  10-40 mL Intracatheter Q12H   Continuous Infusions: . HYDROmorphone 4 mg/hr (04/12/20 1218)  . ondansetron (ZOFRAN) IV       LOS: 25 days    Time spent: 15 mins     Shelly Coss, MD [facility] P9/04/2020, 1:29 PM

## 2020-04-12 NOTE — Progress Notes (Signed)
   04/12/20 1022  Clinical Encounter Type  Visited With Patient;Patient and family together  Visit Type Follow-up;Spiritual support  Referral From Palliative care team  Consult/Referral To Chaplain  Spiritual Encounters  Spiritual Needs Grief support  This chaplain is present with Pt. and Pt. grandson-Andrew. [name] states because the Pt. was comfortable overnight he was able to rest.  The Pt. is able to respond to the chaplain and [name] with a few soft words.  [name] is appreciative of the family's support and recognizes the importance of the family visits to the Pt.  [name] accepts F/U spiritual care for the Pt. and family as needed.

## 2020-04-12 NOTE — Plan of Care (Signed)
°  Problem: Coping: °Goal: Level of anxiety will decrease °Outcome: Progressing °  °

## 2020-04-12 NOTE — Progress Notes (Signed)
Stop by to see [name] this morning.  She is now on a Dilaudid drip.  She is really not all that responsive to me.  More importantly, was that she was comfortable.  She is not having any dyspnea.  I just hate that she is going to pass on from her cardiomyopathy and congestive heart failure.  I know that she is tried real hard.  She has had incredible care from all the cardiologists.  I did have a prayer with her.  She has always understood where she is going.  She has never been afraid.  She just hates that she is going to leave her family.  I know that everything has been done that could be done for her.  I very much appreciate all of the great care that she had from everybody in [facility].  I know my nurses will be very sad to know that she would not be coming back to the office.  They really liked [name].  I know I will see her again.  At that time, we both will be walking together and we will both have new bodies and more importantly, is that we will be walking with Jesus.  Kayla Haw, MD  2 Timothy 4:17-18

## 2020-04-12 NOTE — Progress Notes (Signed)
Wasted 50 cc of Dilaudid drip. Bag was leaking due to last RN punctured it. So rest of dilaudid was on the floor. Towanda Octave witnessed the waste

## 2020-04-12 NOTE — Progress Notes (Signed)
Daily Progress Note   Patient Name: [name]       Date: 04/12/2020 DOB: 09/18/1945  Age: 74 y.o. MRN#: [id number] Attending Physician: Shelly Coss, MD Primary Care Physician: Patient, No Pcp Per Admit Date: 04/02/2020  Reason for Consultation/Follow-up: Establishing goals of care  Subjective/GOC: Patient wakes to voice but confused. Appears comfortable without s/s of pain or distress.  [name] at bedside. He shares that his grandmother had a better night. She slept well on dilaudid infusion and following ativan dose. He shares that family from [place] will be visiting today and requesting to decrease dilaudid gtt slightly as he feels she is more lethargic and confused today when she has been interactive for 2 days. [name] does acknowledge he does not wish to see her suffer though. Explained that we can decrease gtt down to 3mg /hr but if she becomes more uncomfortable this afternoon/evening, recommendation is for increase in gtt again. [name] confirms understanding.  Also discussed consideration of transfer to hospice facility. [name] prefer she stay inpatient as she is receiving excellent care and would ideally not wish for her to be put through a transfer. Explained that this may be brought back up but that I will defer to attending.   [name] has PMT contact information and understands he can call with questions/concerns this afternoon.    Length of Stay: 25  Current Medications: Scheduled Meds:  . chlorhexidine  15 mL Mouth Rinse BID  . glycopyrrolate  0.2 mg Intravenous Q6H  . LORazepam  0.5 mg Intravenous QHS  . mouth rinse  15 mL Mouth Rinse q12n4p  . sodium chloride flush  10-40 mL Intracatheter Q12H    Continuous Infusions: . HYDROmorphone 4 mg/hr  (04/12/20 0346)  . ondansetron (ZOFRAN) IV      PRN Meds: acetaminophen **OR** acetaminophen, diphenhydrAMINE, glycopyrrolate, HYDROmorphone, HYDROmorphone (DILAUDID) injection, LORazepam, ondansetron **OR** ondansetron (ZOFRAN) IV, ondansetron (ZOFRAN) IV, polyethylene glycol, senna, sodium chloride, sodium chloride flush  Physical Exam Vitals and nursing note reviewed.  Constitutional:      Appearance: She is ill-appearing.  Cardiovascular:     Rate and Rhythm: Rhythm irregularly irregular.  Pulmonary:     Effort: No tachypnea, accessory muscle usage or respiratory distress.     Breath sounds: Decreased breath sounds present.  Skin:    General: Skin is warm  and dry.  Neurological:     Mental Status: She is lethargic.            Vital Signs: BP 101/62 (BP Location: Left Arm)   Pulse (!) 108   Temp 98.1 F (36.7 C) (Oral)   Resp 12   Ht 5\' 4"  (1.626 m)   Wt 75.8 kg   SpO2 93%   BMI 28.68 kg/m  SpO2: SpO2: 93 % O2 Device: O2 Device: Nasal Cannula O2 Flow Rate: O2 Flow Rate (L/min): 2 L/min  Intake/output summary:  No intake or output data in the 24 hours ending 04/12/20 1157 LBM: Last BM Date: 03/16/20 Baseline Weight: Weight: 77.1 kg Most recent weight: Weight: 75.8 kg       Palliative Assessment/Data: PPS 20%      Patient Active Problem List   Diagnosis Date Noted  . Dyspnea   . Malnutrition of moderate degree 03/27/2020  . 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 Terminal care   . Palliative care by specialist   . Reactive depression   . Sepsis due to cellulitis ([place]) 03/18/2020  . Hypocalcemia 03/18/2020  . Cellulitis of left lower extremity 03/18/2020  . Hyperkalemia 03/18/2020  . Dehydration with hyponatremia 03/18/2020  . Lactic acidosis 03/18/2020  . Family history of breast cancer   . Lynch syndrome   . Genetic testing 01/09/2020  . Breast cancer metastasized to bone ([place]) 12/06/2019  . Goals of care,  counseling/discussion 12/06/2019  . HTN (hypertension)   . Chest pain   . Pressure injury of skin 12/05/2016  . 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 NSTEMI (non-ST elevated myocardial infarction) ([place]) 11/27/2016  . Ventricular fibrillation ([place]) 11/27/2016  . Head injury   . ICD (implantable cardioverter-defibrillator) discharge   . Diabetic neuropathy ([place]) 06/16/2016  . CKD (chronic kidney disease) stage 3, GFR 30-59 ml/min 06/16/2016  . Fatigue 10/30/2015  . Paroxysmal atrial fibrillation with rapid ventricular response ([place]) 10/02/2015  . Acute on chronic systolic CHF (congestive heart failure) ([place]) 10/02/2015  . Atrial flutter ([place]) 09/18/2015  . Chronic anticoagulation-Xarelto 09/18/2015  . Acute on chronic congestive heart failure ([place]) 09/18/2015  . Orthostatic hypotension   . ICD in place- St Jude   . HLD (hyperlipidemia) 05/11/2015  . Syncope 05/11/2015  . Abdominal pain 05/11/2015  . Acute kidney injury ([place])   . Nausea & vomiting   . Hypothyroidism 04/27/2015  . Chronic diastolic CHF (congestive heart failure) ([place])   . Abnormal PFT   . CAD (coronary artery disease) 04/21/2015  . Acute respiratory failure ([place])   . Ventricular tachycardia- Sept 2016- ICD 04/13/2015  . UTI (lower urinary tract infection) 03/31/2015  . Sepsis ([place]) 03/31/2015  . Atrial fibrillation with RVR ([place]) 03/31/2015  . Systolic CHF ([place])   . Hypertrophic cardiomyopathy ([place])   . Septic bursitis of elbow 02/03/2013  . Acute blood loss anemia 01/08/2013  . Diabetes mellitus type 2, noninsulin dependent ([place])   . Cellulitis 12/31/2012  . Fasting hyperglycemia 12/31/2012  . Leukocytosis 12/31/2012  . Hypotension 12/31/2012    Palliative Care Assessment & Plan   Patient Profile: 74 y.o. female Patient with Stage IV breast cancer, currently s/p one round of nivolumab, has had radiation tx to spine, previously on Ibrance but did not tolerate; CHF with EF 35-40%, atrial fib/flutter- St. Jude ICD in  place, HTN, DM2, CKD III. Currently admitted on 03/16/2020 with sepsis secondary to bilateral lower extremity cellulitis. Admission complicated by afib  with RVR- intolerant to many cardiac medications.  Palliative consulted for goals of care.  9/3 Clinically worsening. Remains on milrinone and lasix infusions. Patient uncomfortable with dyspnea and wheezing. Creatinine worsening with uremia and tremors. Transitioned to comfort measures on 04/06/20.  Assessment: Acute on chronic combined CHF EF 35-40%  Cardiogenic shock Hx of ischemic cardiomyopathy Afib RVR AKI on CKD CAD Sepsis resolved, BLE cellulitis Metastatic breast cancer with bone mets Lynch syndrome  Recommendations/Plan:  After multiple [place] discussions 04/06/20, transitioned to comfort measures only per patient wishes. Family understands and respects her wishes. Family understands diagnoses and poor prognosis.  Continue dilaudid infusion for comfort. Continue prn symptom management medications. Scheduled HS ativan.  ICD deactivated.  Comfort feeds per patient/family request.  Ongoing spiritual care support.  Unrestricted visitor access as patient is nearing EOL.   Discussed consideration of residential hospice with grandson. He prefer she remain inpatient. Will defer this decision to attending.   Code Status: DNR/DNI   Code Status Orders  (From admission, onward)         Start     Ordered   03/19/20 1751  Do not attempt resuscitation (DNR)  Continuous       Question Answer Comment  In the event of cardiac or respiratory ARREST Do not call a "code blue"   In the event of cardiac or respiratory ARREST Do not perform Intubation, CPR, defibrillation or ACLS   In the event of cardiac or respiratory ARREST Use medication by any route, position, wound care, and other measures to relive pain and suffering. May use oxygen, suction and manual treatment of airway obstruction as needed for comfort.      03/19/20 1750          Code Status History    Date Active Date Inactive Code Status Order ID Comments User Context   03/18/2020 0254 03/19/2020 1750 Full Code [id number]  Vernelle Emerald, MD ED   12/04/2019 1812 12/11/2019 0423 Full Code [id number]  Mckinley Jewel, MD Inpatient   12/04/2019 1559 12/04/2019 1812 DNR [id number]  Mckinley Jewel, MD ED   12/04/2019 1537 12/04/2019 1559 Full Code [id number]  Mckinley Jewel, MD ED   11/27/2016 1350 01/08/2017 1922 Full Code [id number]  Erma Heritage, PA-C Inpatient   09/17/2015 2117 09/24/2015 1703 Full Code [id number]  Burtis Junes, NP Inpatient   05/11/2015 2245 05/14/2015 1820 Full Code [id number]  Ivor Costa, MD ED   04/19/2015 0837 04/23/2015 1722 Full Code [id number]  [name], MD Inpatient   04/16/2015 1749 04/19/2015 0837 Full Code [id number]  [name], MD Inpatient   04/13/2015 1709 04/16/2015 1749 Full Code [id number]  Marshell Garfinkel, MD ED   03/31/2015 0604 04/05/2015 2132 Full Code [id number]  Theressa Millard, MD Inpatient   01/06/2013 2344 01/17/2013 1636 Full Code [id number]  [name], MD Inpatient   12/31/2012 1800 01/06/2013 2344 Full Code [id number]  Theodis Blaze, MD ED   Advance Care Planning Activity       Prognosis:   <2 weeks if not days   Discharge Planning:  Anticipate hospital death  Care plan was discussed with RN, grandson  Thank you for allowing the Palliative Medicine Team to assist in the care of this patient.   Total Time 20 Prolonged Time Billed  no    Greater than 50% of this time was spent counseling and coordinating care related to the above assessment and plan.   Ihor Dow, DNP, FNP-C Palliative  Medicine Team  Phone: [phone] Fax: [phone]  Please contact Palliative Medicine Team phone at [phone] for questions and concerns.

## 2020-04-13 NOTE — Progress Notes (Signed)
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 fib, ventricular tachycardia status post ICD in situ, breast cancer with bone mets, Lynch syndrome  who presented to the ED on8/14/2021with complaint of shortness of breath, worsening redness on both upper legs with weeping lesions that started gradually after she used dermatome (exfoliative scrub) on her legs.   In the ED, patient was noted to be in A. fib with RVR to 120s, stable blood pressure.   Labs showed WBC count elevated to 24.5, lactic acid 2.2, sodium low at 129, potassium elevated to 5.4, BUN/creatinine 55/2.03, serum bicarb low at 15.  Chest x-ray showed cardiomegaly without overt failure.Patient was admitted to hospital service for sepsis secondary to bilateral lower extremity cellulitis. Hospital course was also complicated and was prolonged by acute exacerbation of CHF/cardiogenic shock.  Heart failure team is following.  After discussion about extensive goals of care, patient wanted to stop aggressive care and pursue comfort care measures.  Palliative care  following.  Currently on full comfort care.  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 After extensive discussion  with patient and family, started on full comfort care.  Acute exacerbation of chronic combined CHF/cardiogenic shock/ Essential hypertension -EF 35-40% with global hypokinesis. -Cardiology  team was following -Patient was placed on Lasix drip and milrinone drip.  But her respiratory status did not improve.  Bilateral lower extremity cellulitis Sepsis -POA -resolved Immunocompromised status -Presented with worsening redness, pain, weeping lesions of both lower extremities. -Antibiotic course completed on 8/22.  A. fib with RVR H/o paroxysmal A. Fib s/p left atrial appendage clip History of V. Tach, ICD in situ.  -Presented with RVR, likely precipitated by sepsis. -She was on amiodarone, Eliquis.   Breast cancer with bone mets, Lynch syndrome Chronic pain -Breast cancer diagnosed in May. -Follows with Dr. Marin Olp  Acute kidney injury on CKD 3a -Kidney function continued to worsen due to ATN -She was uremic and volume overloaded.  CAD s/p CABG HLD -On Eliquis.    Hyponatremia -Remains in the range of 130 to 135.  Diabetes mellitus type 2  -A1c 7.2 on 8/15.  Bladder prolapse -Follows  with urology as an outpatient.  Nutrition Problem: Moderate Malnutrition Etiology: chronic illness, cancer and cancer related treatments      DVT prophylaxis:None Code Status: DNR/comfort care Family Communication: grandson present at bedside Status is: Inpatient  Remains inpatient appropriate because:Inpatient level of care appropriate due to severity of illness   Dispo:  Patient From:    Planned Disposition: Hospital death anticipated      Consultants: Cardiology   Antimicrobials:  Anti-infectives (From admission, onward)   Start     Dose/Rate Route Frequency Ordered Stop   03/20/20 1430  cefTRIAXone (ROCEPHIN) 2 g in sodium chloride 0.9 % 100 mL IVPB        2 g 200 mL/hr over 30 Minutes Intravenous Every 24 hours 03/20/20 1345 03/25/20 1320   03/20/20 0600   vancomycin (VANCOREADY) IVPB 1250 mg/250 mL  Status:  Discontinued        1,250 mg 166.7 mL/hr over 90 Minutes Intravenous Every 48 hours 03/18/20 0230 03/20/20 1345   03/18/20 0330  vancomycin (VANCOREADY) IVPB 1500 mg/300 mL        1,500 mg 150 mL/hr over 120 Minutes Intravenous  Once 03/18/20 0230 03/18/20 0803   03/18/20 0330  meropenem (MERREM) 1 g in sodium chloride 0.9 % 100 mL IVPB  Status:  Discontinued        1 g 200 mL/hr over 30 Minutes Intravenous 2 times daily 03/18/20 0230 03/20/20 1345   03/18/20 0130  piperacillin-tazobactam (ZOSYN) IVPB 3.375 g  Status:  Discontinued        3.375 g 100 mL/hr over 30 Minutes Intravenous  Once 03/18/20 0115 03/18/20 0214      Subjective: Patient seen and examined at the bedside this morning.  She was unresponsive today, also looked uncomfortable.  Hypotensive this morning.  Dilaudid drip increased and was also given Ativan p.o. for comfort.   Objective: Vitals:   04/10/20 0459 04/11/20 0505 04/12/20 0543 04/13/20 0506  BP: 113/66 (!) 101/56 101/62 (!) 87/48  Pulse: 74 (!) 105 (!) 108 99  Resp: 17 18 12 18   Temp: (!) 97.5 F (36.4 C) (!) 97.5 F (36.4 C) 98.1 F (36.7 C) 98.8 F (37.1 C)  TempSrc: Oral Oral Oral Oral  SpO2: 99% 94% 93% 98%  Weight:      Height:        Intake/Output Summary (Last 24 hours) at 04/13/2020 1433 Last data filed at 04/12/2020 2159 Gross per 24 hour  Intake 10 ml  Output --  Net 10 ml   Filed Weights   04/03/20 0400 04/05/20 0439 04/06/20 0500  Weight: 75.7 kg 79 kg 75.8 kg    Examination:  General exam: Unresponsive,lying on bed   Data Reviewed: I have personally reviewed following labs and imaging studies  CBC: No results for input(s): WBC, NEUTROABS, HGB, HCT, MCV, PLT in the last 168 hours. Basic Metabolic Panel: No results for input(s): NA, K, CL, CO2, GLUCOSE, BUN, CREATININE, CALCIUM, MG, PHOS in the last 168 hours. GFR: Estimated Creatinine Clearance: 18.9 mL/min (A) (by C-G  formula based on SCr of 2.68 mg/dL (H)). Liver Function Tests: No results for input(s): AST, ALT, ALKPHOS, BILITOT, PROT, ALBUMIN in the last 168 hours. No results for input(s): LIPASE, AMYLASE in the last 168 hours. No results for input(s): AMMONIA in the last 168 hours. Coagulation Profile: No results for input(s): INR, PROTIME in the last 168 hours. Cardiac Enzymes: No results for input(s): CKTOTAL, CKMB, CKMBINDEX, TROPONINI in the last 168 hours. BNP (last 3 results) No results for input(s): PROBNP in the last 8760 hours. HbA1C: No results for input(s): HGBA1C in the last 72 hours. CBG: No results for input(s): GLUCAP in the last 168 hours. Lipid Profile: No results for input(s): CHOL, HDL, LDLCALC, TRIG, CHOLHDL, LDLDIRECT in the last 72 hours. Thyroid Function Tests: No results for input(s): TSH, T4TOTAL, FREET4, T3FREE, THYROIDAB in the last 72 hours. Anemia Panel: No results for input(s): VITAMINB12, FOLATE, FERRITIN, TIBC, IRON, RETICCTPCT in the last 72 hours. Sepsis Labs: No results for input(s): PROCALCITON, LATICACIDVEN in the last 168 hours.  No results found for this or any previous visit (from the past 240 hour(s)).       Radiology Studies: No results found.      Scheduled Meds: . chlorhexidine  15 mL Mouth Rinse BID  . glycopyrrolate  0.2 mg Intravenous Q6H  . LORazepam  0.5 mg Intravenous QHS  . mouth rinse  15 mL Mouth Rinse q12n4p  . sodium chloride flush  10-40 mL Intracatheter Q12H   Continuous Infusions: . HYDROmorphone 4 mg/hr (04/13/20 1028)  . ondansetron (ZOFRAN) IV       LOS: 26 days    Time spent: 15 mins     Shelly Coss, MD [facility] P9/05/2020, 2:33 PM

## 2020-04-13 NOTE — Progress Notes (Signed)
This chaplain is present for F/U spiritual care.  The Pt is without family at her bedside.The chaplain observed a quiet moment of prayer with the Pt. F/U spiritual care is available as needed.

## 2020-04-13 NOTE — Progress Notes (Signed)
Daily Progress Note   Patient Name: [name]       Date: 04/13/2020 DOB: Mar 05, 1946  Age: 74 y.o. MRN#: [id number] Attending Physician: Shelly Coss, MD Primary Care Physician: Patient, No Pcp Per Admit Date: 03/31/2020  Reason for Consultation/Follow-up: Establishing goals of care  Subjective/GOC: Upon arrival to room, patient moaning and appears uncomfortable. RN recently increased dilaudid gtt to 4mg /hr and bolused dilaudid 2mg  via infusion.   This NP gave dilaudid 2mg  via infusion while at bedside with minimal relief of symptoms.   Discussed with RN Suezanne Jacquet who will give prn ativan.   Noe Gens at bedside. Updated him on vital signs and discussed comfort medications. He feels blessed to have had her this long. Emotional/spiritual support provided. [name] knows to call PMT phone this afternoon with questions or concerns.   Length of Stay: 26  Current Medications: Scheduled Meds:  . chlorhexidine  15 mL Mouth Rinse BID  . glycopyrrolate  0.2 mg Intravenous Q6H  . LORazepam  0.5 mg Intravenous QHS  . mouth rinse  15 mL Mouth Rinse q12n4p  . sodium chloride flush  10-40 mL Intracatheter Q12H    Continuous Infusions: . HYDROmorphone 4 mg/hr (04/13/20 1028)  . ondansetron (ZOFRAN) IV      PRN Meds: acetaminophen **OR** acetaminophen, diphenhydrAMINE, glycopyrrolate, HYDROmorphone, HYDROmorphone (DILAUDID) injection, LORazepam, ondansetron **OR** ondansetron (ZOFRAN) IV, ondansetron (ZOFRAN) IV, polyethylene glycol, senna, sodium chloride, sodium chloride flush  Physical Exam Vitals and nursing note reviewed.  Constitutional:      Appearance: She is ill-appearing.     Comments: moaning  Cardiovascular:     Rate and Rhythm: Rhythm irregularly irregular.    Pulmonary:     Effort: Tachypnea and accessory muscle usage present.     Breath sounds: Decreased breath sounds present.  Skin:    General: Skin is warm and dry.  Neurological:     Mental Status: She is lethargic.     Comments: Moaning/uncomfortable            Vital Signs: BP (!) 87/48 (BP Location: Left Arm)   Pulse 99   Temp 98.8 F (37.1 C) (Oral)   Resp 18   Ht 5\' 4"  (1.626 m)   Wt 75.8 kg   SpO2 98%   BMI 28.68 kg/m  SpO2: SpO2: 98 % O2 Device:  O2 Device: Nasal Cannula O2 Flow Rate: O2 Flow Rate (L/min): 2 L/min  Intake/output summary:   Intake/Output Summary (Last 24 hours) at 04/13/2020 1054 Last data filed at 04/12/2020 2159 Gross per 24 hour  Intake 10 ml  Output --  Net 10 ml   LBM: Last BM Date: 03/16/20 Baseline Weight: Weight: 77.1 kg Most recent weight: Weight: 75.8 kg       Palliative Assessment/Data: PPS 20%      Patient Active Problem List   Diagnosis Date Noted  . Dyspnea   . Malnutrition of moderate degree 03/27/2020  . 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 Terminal care   . Palliative care by specialist   . Reactive depression   . Sepsis due to cellulitis ([place]) 03/18/2020  . Hypocalcemia 03/18/2020  . Cellulitis of left lower extremity 03/18/2020  . Hyperkalemia 03/18/2020  . Dehydration with hyponatremia 03/18/2020  . Lactic acidosis 03/18/2020  . Family history of breast cancer   . Lynch syndrome   . Genetic testing 01/09/2020  . Breast cancer metastasized to bone ([place]) 12/06/2019  . Goals of care, counseling/discussion 12/06/2019  . HTN (hypertension)   . Chest pain   . Pressure injury of skin 12/05/2016  . 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 NSTEMI (non-ST elevated myocardial infarction) ([place]) 11/27/2016  . Ventricular fibrillation ([place]) 11/27/2016  . Head injury   . ICD (implantable cardioverter-defibrillator) discharge   . Diabetic neuropathy ([place]) 06/16/2016  . CKD (chronic kidney  disease) stage 3, GFR 30-59 ml/min 06/16/2016  . Fatigue 10/30/2015  . Paroxysmal atrial fibrillation with rapid ventricular response ([place]) 10/02/2015  . Acute on chronic systolic CHF (congestive heart failure) ([place]) 10/02/2015  . Atrial flutter ([place]) 09/18/2015  . Chronic anticoagulation-Xarelto 09/18/2015  . Acute on chronic congestive heart failure ([place]) 09/18/2015  . Orthostatic hypotension   . ICD in place- St Jude   . HLD (hyperlipidemia) 05/11/2015  . Syncope 05/11/2015  . Abdominal pain 05/11/2015  . Acute kidney injury ([place])   . Nausea & vomiting   . Hypothyroidism 04/27/2015  . Chronic diastolic CHF (congestive heart failure) ([place])   . Abnormal PFT   . CAD (coronary artery disease) 04/21/2015  . Acute respiratory failure ([place])   . Ventricular tachycardia- Sept 2016- ICD 04/13/2015  . UTI (lower urinary tract infection) 03/31/2015  . Sepsis ([place]) 03/31/2015  . Atrial fibrillation with RVR ([place]) 03/31/2015  . Systolic CHF ([place])   . Hypertrophic cardiomyopathy ([place])   . Septic bursitis of elbow 02/03/2013  . Acute blood loss anemia 01/08/2013  . Diabetes mellitus type 2, noninsulin dependent ([place])   . Cellulitis 12/31/2012  . Fasting hyperglycemia 12/31/2012  . Leukocytosis 12/31/2012  . Hypotension 12/31/2012    Palliative Care Assessment & Plan   Patient Profile: 74 y.o. female Patient with Stage IV breast cancer, currently s/p one round of nivolumab, has had radiation tx to spine, previously on Ibrance but did not tolerate; CHF with EF 35-40%, atrial fib/flutter- St. Jude ICD in place, HTN, DM2, CKD III. Currently admitted on 03/16/2020 with sepsis secondary to bilateral lower extremity cellulitis. Admission complicated by afib with RVR- intolerant to many cardiac medications.  Palliative consulted for goals of care.  9/3 Clinically worsening. Remains on milrinone and lasix infusions. Patient uncomfortable with dyspnea and wheezing. Creatinine worsening with uremia and  tremors. Transitioned to comfort measures on 04/06/20.  Assessment: Acute on chronic combined CHF EF 35-40%  Cardiogenic shock Hx of ischemic  cardiomyopathy Afib RVR AKI on CKD CAD Sepsis resolved, BLE cellulitis Metastatic breast cancer with bone mets Lynch syndrome  Recommendations/Plan:  Comfort measures only.  Continue dilaudid infusion for comfort. Continue prn symptom management medications. Scheduled HS ativan.  ICD deactivated.  Comfort feeds per patient/family request.  Ongoing spiritual care support.  Unrestricted visitor access as patient is nearing EOL.   Discussed consideration of residential hospice with grandson on 9/9. He prefer she remain inpatient. Will defer this decision to attending.    Hypotension this AM. Patient may be getting closer to EOL. Uncomfortable 9/10. Dilaudid gtt increased and dilaudid bolus/ativan IV push given for comfort.   Code Status: DNR/DNI   Code Status Orders  (From admission, onward)         Start     Ordered   03/19/20 1751  Do not attempt resuscitation (DNR)  Continuous       Question Answer Comment  In the event of cardiac or respiratory ARREST Do not call a "code blue"   In the event of cardiac or respiratory ARREST Do not perform Intubation, CPR, defibrillation or ACLS   In the event of cardiac or respiratory ARREST Use medication by any route, position, wound care, and other measures to relive pain and suffering. May use oxygen, suction and manual treatment of airway obstruction as needed for comfort.      03/19/20 1750        Code Status History    Date Active Date Inactive Code Status Order ID Comments User Context   03/18/2020 0254 03/19/2020 1750 Full Code [id number]  Vernelle Emerald, MD ED   12/04/2019 1812 12/11/2019 0423 Full Code [id number]  Mckinley Jewel, MD Inpatient   12/04/2019 1559 12/04/2019 1812 DNR [id number]  Mckinley Jewel, MD ED   12/04/2019 1537 12/04/2019 1559 Full Code [id number]  Mckinley Jewel, MD  ED   11/27/2016 1350 01/08/2017 1922 Full Code [id number]  Erma Heritage, PA-C Inpatient   09/17/2015 2117 09/24/2015 1703 Full Code [id number]  Burtis Junes, NP Inpatient   05/11/2015 2245 05/14/2015 1820 Full Code [id number]  Ivor Costa, MD ED   04/19/2015 0837 04/23/2015 1722 Full Code [id number]  [name], MD Inpatient   04/16/2015 1749 04/19/2015 0837 Full Code [id number]  [name], MD Inpatient   04/13/2015 1709 04/16/2015 1749 Full Code [id number]  Marshell Garfinkel, MD ED   03/31/2015 0604 04/05/2015 2132 Full Code [id number]  Theressa Millard, MD Inpatient   01/06/2013 2344 01/17/2013 1636 Full Code [id number]  [name], MD Inpatient   12/31/2012 1800 01/06/2013 2344 Full Code [id number]  Theodis Blaze, MD ED   Advance Care Planning Activity       Prognosis:   days if not hours  Discharge Planning:  Anticipate hospital death  Care plan was discussed with RN, grandson  Thank you for allowing the Palliative Medicine Team to assist in the care of this patient.   Total Time 20 Prolonged Time Billed  no    Greater than 50% of this time was spent counseling and coordinating care related to the above assessment and plan.   Ihor Dow, DNP, FNP-C Palliative Medicine Team  Phone: [phone] Fax: [phone]  Please contact Palliative Medicine Team phone at [phone] for questions and concerns.

## 2020-04-14 DIAGNOSIS — F411 Generalized anxiety disorder: Secondary | ICD-10-CM

## 2020-04-14 MED ORDER — LORAZEPAM 2 MG/ML IJ SOLN
1.0000 mg | INTRAMUSCULAR | Status: DC | PRN
  Administered 2020-04-14 – 2020-04-15 (×3): 1 mg via INTRAVENOUS
  Filled 2020-04-14 (×3): qty 1

## 2020-04-14 MED ORDER — LORAZEPAM 2 MG/ML IJ SOLN
0.5000 mg | Freq: Four times a day (QID) | INTRAMUSCULAR | Status: DC
  Administered 2020-04-14 – 2020-04-15 (×4): 0.5 mg via INTRAVENOUS
  Filled 2020-04-14 (×4): qty 1

## 2020-04-14 NOTE — Progress Notes (Signed)
Daily Progress Note   Patient Name: [name]       Date: 04/14/2020 DOB: 14-Oct-1945  Age: 74 y.o. MRN#: [id number] Attending Physician: Shelly Coss, MD Primary Care Physician: Patient, No Pcp Per Admit Date: 03/20/2020  Reason for Consultation/Follow-up: Establishing goals of care  Subjective/GOC: Patient intermittently moaning while at bedside. Dilaudid 1mg  bolus via infusion given. Grandson agrees with scheduling ativan. Patient has shallow respirations with occasional periods of apnea.   Discussed plan with grandson, [name]. Emotional/spiritual support provided. Discussed with RN  Length of Stay: 27  Current Medications: Scheduled Meds:  . chlorhexidine  15 mL Mouth Rinse BID  . glycopyrrolate  0.2 mg Intravenous Q6H  . LORazepam  0.5 mg Intravenous Q6H  . mouth rinse  15 mL Mouth Rinse q12n4p  . sodium chloride flush  10-40 mL Intracatheter Q12H    Continuous Infusions: . HYDROmorphone 4 mg/hr (04/14/20 0717)  . ondansetron (ZOFRAN) IV      PRN Meds: acetaminophen **OR** acetaminophen, diphenhydrAMINE, glycopyrrolate, HYDROmorphone, HYDROmorphone (DILAUDID) injection, LORazepam, ondansetron **OR** ondansetron (ZOFRAN) IV, ondansetron (ZOFRAN) IV, polyethylene glycol, senna, sodium chloride, sodium chloride flush  Physical Exam Vitals and nursing note reviewed.  Constitutional:      Appearance: She is ill-appearing.     Comments: Moaning. Unresponsive to sternal rub.  Cardiovascular:     Rate and Rhythm: Rhythm irregularly irregular.  Pulmonary:     Effort: No tachypnea or accessory muscle usage.     Breath sounds: Decreased breath sounds present.     Comments: Shallow, regular. Occasional episodes of apnea Skin:    General: Skin is warm and dry.    Neurological:     Mental Status: She is unresponsive.     Comments: Intermittent moaning            Vital Signs: BP 104/67 (BP Location: Left Arm)   Pulse (!) 111   Temp 98.5 F (36.9 C) (Oral)   Resp 19   Ht 5\' 4"  (1.626 m)   Wt 75.8 kg   SpO2 95%   BMI 28.68 kg/m  SpO2: SpO2: 95 % O2 Device: O2 Device: Nasal Cannula O2 Flow Rate: O2 Flow Rate (L/min): 2 L/min  Intake/output summary:   Intake/Output Summary (Last 24 hours) at 04/14/2020 1136 Last data filed at 04/14/2020 0623 Gross per 24 hour  Intake 0 ml  Output --  Net 0 ml   LBM: Last BM Date: 03/16/20 Baseline Weight: Weight: 77.1 kg Most recent weight: Weight: 75.8 kg       Palliative Assessment/Data: PPS 10%      Patient Active Problem List   Diagnosis Date Noted  . Dyspnea   . Malnutrition of moderate degree 03/27/2020  . 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 Terminal care   . Palliative care by specialist   . Reactive depression   . Sepsis due to cellulitis ([place]) 03/18/2020  . Hypocalcemia 03/18/2020  . Cellulitis of left lower extremity 03/18/2020  . Hyperkalemia 03/18/2020  . Dehydration with hyponatremia 03/18/2020  . Lactic acidosis 03/18/2020  . Family history of breast cancer   . Lynch syndrome   . Genetic testing 01/09/2020  . Breast cancer metastasized to bone ([place]) 12/06/2019  . Goals of care, counseling/discussion 12/06/2019  . HTN (hypertension)   . Chest pain   . Pressure injury of skin 12/05/2016  . 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 NSTEMI (non-ST elevated myocardial infarction) ([place]) 11/27/2016  . Ventricular fibrillation ([place]) 11/27/2016  . Head injury   . ICD (implantable cardioverter-defibrillator) discharge   . Diabetic neuropathy ([place]) 06/16/2016  . CKD (chronic kidney disease) stage 3, GFR 30-59 ml/min 06/16/2016  . Fatigue 10/30/2015  . Paroxysmal atrial fibrillation with rapid ventricular response ([place]) 10/02/2015  . Acute on  chronic systolic CHF (congestive heart failure) ([place]) 10/02/2015  . Atrial flutter ([place]) 09/18/2015  . Chronic anticoagulation-Xarelto 09/18/2015  . Acute on chronic congestive heart failure ([place]) 09/18/2015  . Orthostatic hypotension   . ICD in place- St Jude   . HLD (hyperlipidemia) 05/11/2015  . Syncope 05/11/2015  . Abdominal pain 05/11/2015  . Acute kidney injury ([place])   . Nausea & vomiting   . Hypothyroidism 04/27/2015  . Chronic diastolic CHF (congestive heart failure) ([place])   . Abnormal PFT   . CAD (coronary artery disease) 04/21/2015  . Acute respiratory failure ([place])   . Ventricular tachycardia- Sept 2016- ICD 04/13/2015  . UTI (lower urinary tract infection) 03/31/2015  . Sepsis ([place]) 03/31/2015  . Atrial fibrillation with RVR ([place]) 03/31/2015  . Systolic CHF ([place])   . Hypertrophic cardiomyopathy ([place])   . Septic bursitis of elbow 02/03/2013  . Acute blood loss anemia 01/08/2013  . Diabetes mellitus type 2, noninsulin dependent ([place])   . Cellulitis 12/31/2012  . Fasting hyperglycemia 12/31/2012  . Leukocytosis 12/31/2012  . Hypotension 12/31/2012    Palliative Care Assessment & Plan   Patient Profile: 74 y.o. female Patient with Stage IV breast cancer, currently s/p one round of nivolumab, has had radiation tx to spine, previously on Ibrance but did not tolerate; CHF with EF 35-40%, atrial fib/flutter- St. Jude ICD in place, HTN, DM2, CKD III. Currently admitted on 03/04/2020 with sepsis secondary to bilateral lower extremity cellulitis. Admission complicated by afib with RVR- intolerant to many cardiac medications.  Palliative consulted for goals of care.  9/3 Clinically worsening. Remains on milrinone and lasix infusions. Patient uncomfortable with dyspnea and wheezing. Creatinine worsening with uremia and tremors. Transitioned to comfort measures on 04/06/20.  Assessment: Acute on chronic combined CHF EF 35-40%  Cardiogenic shock Hx of ischemic cardiomyopathy Afib  RVR AKI on CKD CAD Sepsis resolved, BLE cellulitis Metastatic breast cancer with bone mets Lynch syndrome  Recommendations/Plan:  Comfort measures only.  Continue dilaudid infusion for comfort. Continue prn symptom management medications. Scheduled  Ativan 0.5mg  IV q6h.   ICD deactivated.  Comfort feeds per patient/family request.  Ongoing spiritual care support.  Unrestricted visitor access as patient is nearing EOL.   Discussed consideration of residential hospice with grandson on 9/9. He prefer she remain inpatient. Will defer this decision to attending.    Code Status: DNR/DNI   Code Status Orders  (From admission, onward)         Start     Ordered   03/19/20 1751  Do not attempt resuscitation (DNR)  Continuous       Question Answer Comment  In the event of cardiac or respiratory ARREST Do not call a "code blue"   In the event of cardiac or respiratory ARREST Do not perform Intubation, CPR, defibrillation or ACLS   In the event of cardiac or respiratory ARREST Use medication by any route, position, wound care, and other measures to relive pain and suffering. May use oxygen, suction and manual treatment of airway obstruction as needed for comfort.      03/19/20 1750        Code Status History    Date Active Date Inactive Code Status Order ID Comments User Context   03/18/2020 0254 03/19/2020 1750 Full Code [id number]  Vernelle Emerald, MD ED   12/04/2019 1812 12/11/2019 0423 Full Code [id number]  Mckinley Jewel, MD Inpatient   12/04/2019 1559 12/04/2019 1812 DNR [id number]  Mckinley Jewel, MD ED   12/04/2019 1537 12/04/2019 1559 Full Code [id number]  Mckinley Jewel, MD ED   11/27/2016 1350 01/08/2017 1922 Full Code [id number]  Erma Heritage, PA-C Inpatient   09/17/2015 2117 09/24/2015 1703 Full Code [id number]  Burtis Junes, NP Inpatient   05/11/2015 2245 05/14/2015 1820 Full Code [id number]  Ivor Costa, MD ED   04/19/2015 0837 04/23/2015 1722 Full Code [id number]  [name], MD Inpatient   04/16/2015 1749 04/19/2015 0837 Full Code [id number]  [name], MD Inpatient   04/13/2015 1709 04/16/2015 1749 Full Code [id number]  Marshell Garfinkel, MD ED   03/31/2015 0604 04/05/2015 2132 Full Code [id number]  Theressa Millard, MD Inpatient   01/06/2013 2344 01/17/2013 1636 Full Code [id number]  [name], MD Inpatient   12/31/2012 1800 01/06/2013 2344 Full Code [id number]  Theodis Blaze, MD ED   Advance Care Planning Activity       Prognosis:   days if not hours  Discharge Planning:  Anticipate hospital death  Care plan was discussed with RN, grandson  Thank you for allowing the Palliative Medicine Team to assist in the care of this patient.   Total Time 20 Prolonged Time Billed  no    Greater than 50% of this time was spent counseling and coordinating care related to the above assessment and plan.   Ihor Dow, DNP, FNP-C Palliative Medicine Team  Phone: [phone] Fax: [phone]  Please contact Palliative Medicine Team phone at [phone] for questions and concerns.

## 2020-04-14 NOTE — Progress Notes (Signed)
PROGRESS NOTE    [name]  WUJ:[id number] DOB: 01/05/1946 DOA: 03/09/2020 PCP: Patient, No Pcp Per   Brief Narrative: Patient is a 74 y.o.femalewith PMH of DM2, HTN, HLD, CAD s/p CABG, 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 fib, ventricular tachycardia status post ICD in situ, breast cancer with bone mets, Lynch syndrome  who presented to the ED on8/14/2021with complaint of shortness of breath, worsening redness on both upper legs with weeping lesions that started gradually after she used dermatome (exfoliative scrub) on her legs.   In the ED, patient was noted to be in A. fib with RVR to 120s.  Chest x-ray showed cardiomegaly without overt failure.Patient was admitted to hospital service for sepsis secondary to bilateral lower extremity cellulitis. Hospital course was also complicated and was prolonged by acute exacerbation of CHF/cardiogenic shock.  Heart failure team was following.  After discussion about extensive goals of care, patient wanted to stop aggressive care and pursue comfort care measures.  Palliative care  following.  Currently on full comfort care.  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 After extensive discussion with patient and family,  started on full comfort care.On dilaudid drip  Acute exacerbation of chronic combined CHF/cardiogenic shock/ Essential hypertension -EF 35-40% with global hypokinesis. -Cardiology  team was following -Patient was placed on Lasix drip and milrinone drip.  But her respiratory status did not improve.  Bilateral lower extremity cellulitis Sepsis -POA -resolved Immunocompromised status -Presented with worsening redness, pain, weeping lesions of both lower extremities. -Antibiotic course completed on 8/22.  A. fib with RVR H/o paroxysmal A. Fib s/p left atrial appendage clip History of V. Tach, ICD in situ.  -Presented with RVR, likely precipitated by sepsis. -She was on amiodarone, Eliquis.   Breast cancer with bone mets, Lynch syndrome Chronic pain -Breast cancer diagnosed in May. -Follows with Dr. Marin Olp  Acute kidney injury on CKD 3a -Kidney function continued to worsen due to ATN -She was uremic and volume overloaded.  CAD s/p CABG HLD -She was on  Eliquis.    Diabetes mellitus type 2  -A1c 7.2 on 8/15.  Bladder prolapse -Follows  with urology as an outpatient.  Nutrition Problem: Moderate Malnutrition Etiology: chronic illness, cancer and cancer related treatments      DVT prophylaxis:None Code Status: DNR/comfort care Family Communication: grandson present at bedside Status is: Inpatient  Remains inpatient appropriate because:Inpatient level of care appropriate due to severity of illness   Dispo:  Patient From:  Home  Planned Disposition: Hospital death anticipated      Consultants: Cardiology   Antimicrobials:  Anti-infectives (From admission, onward)   Start     Dose/Rate Route Frequency Ordered Stop   03/20/20 1430  cefTRIAXone (ROCEPHIN) 2 g in sodium chloride 0.9 % 100 mL  IVPB        2 g 200 mL/hr over 30 Minutes Intravenous Every 24 hours 03/20/20 1345 03/25/20 1320   03/20/20 0600  vancomycin (VANCOREADY) IVPB 1250 mg/250 mL  Status:   Discontinued        1,250 mg 166.7 mL/hr over 90 Minutes Intravenous Every 48 hours 03/18/20 0230 03/20/20 1345   03/18/20 0330  vancomycin (VANCOREADY) IVPB 1500 mg/300 mL        1,500 mg 150 mL/hr over 120 Minutes Intravenous  Once 03/18/20 0230 03/18/20 0803   03/18/20 0330  meropenem (MERREM) 1 g in sodium chloride 0.9 % 100 mL IVPB  Status:  Discontinued        1 g 200 mL/hr over 30 Minutes Intravenous 2 times daily 03/18/20 0230 03/20/20 1345   03/18/20 0130  piperacillin-tazobactam (ZOSYN) IVPB 3.375 g  Status:  Discontinued        3.375 g 100 mL/hr over 30 Minutes Intravenous  Once 03/18/20 0115 03/18/20 0214      Subjective: Patient seen and examined at the bedside this morning.  She was unresponsive.  Did not look in any kind of distress but her breathing looked shallow and agonal.  She might be needing towards the end of life.  Grandson son was present at the bedside.  Objective: Vitals:   04/11/20 0505 04/12/20 0543 04/13/20 0506 04/14/20 0506  BP: (!) 101/56 101/62 (!) 87/48 104/67  Pulse: (!) 105 (!) 108 99 (!) 111  Resp: 18 12 18 19   Temp: (!) 97.5 F (36.4 C) 98.1 F (36.7 C) 98.8 F (37.1 C) 98.5 F (36.9 C)  TempSrc: Oral Oral Oral Oral  SpO2: 94% 93% 98% 95%  Weight:      Height:        Intake/Output Summary (Last 24 hours) at 04/14/2020 1354 Last data filed at 04/14/2020 1100 Gross per 24 hour  Intake 0 ml  Output --  Net 0 ml   Filed Weights   04/03/20 0400 04/05/20 0439 04/06/20 0500  Weight: 75.7 kg 79 kg 75.8 kg    Examination:  General exam: Unresponsive,lying on bed, not in  distress CVS: S1, S2, no murmurs or gallops heard. Abdomen: Soft, nondistended Extremities: Bilateral lower extremities wrapped with dressings, no edema   Data Reviewed: I have personally reviewed following labs and imaging studies  CBC: No results for input(s): WBC, NEUTROABS, HGB, HCT, MCV, PLT in the last 168 hours. Basic Metabolic Panel: No results for  input(s): NA, K, CL, CO2, GLUCOSE, BUN, CREATININE, CALCIUM, MG, PHOS in the last 168 hours. GFR: Estimated Creatinine Clearance: 18.9 mL/min (A) (by C-G formula based on SCr of 2.68 mg/dL (H)). Liver Function Tests: No results for input(s): AST, ALT, ALKPHOS, BILITOT, PROT, ALBUMIN in the last 168 hours. No results for input(s): LIPASE, AMYLASE in the last 168 hours. No results for input(s): AMMONIA in the last 168 hours. Coagulation Profile: No results for input(s): INR, PROTIME in the last 168 hours. Cardiac Enzymes: No results for input(s): CKTOTAL, CKMB, CKMBINDEX, TROPONINI in the last 168 hours. BNP (last 3 results) No results for input(s): PROBNP in the last 8760 hours. HbA1C: No results for input(s): HGBA1C in the last 72 hours. CBG: No results for input(s): GLUCAP in the last 168 hours. Lipid Profile: No results for input(s): CHOL, HDL, LDLCALC, TRIG, CHOLHDL, LDLDIRECT in the last 72 hours. Thyroid Function Tests: No results for input(s): TSH, T4TOTAL, FREET4, T3FREE, THYROIDAB in the last 72 hours. Anemia Panel: No results  for input(s): VITAMINB12, FOLATE, FERRITIN, TIBC, IRON, RETICCTPCT in the last 72 hours. Sepsis Labs: No results for input(s): PROCALCITON, LATICACIDVEN in the last 168 hours.  No results found for this or any previous visit (from the past 240 hour(s)).       Radiology Studies: No results found.      Scheduled Meds: . chlorhexidine  15 mL Mouth Rinse BID  . glycopyrrolate  0.2 mg Intravenous Q6H  . LORazepam  0.5 mg Intravenous Q6H  . mouth rinse  15 mL Mouth Rinse q12n4p  . sodium chloride flush  10-40 mL Intracatheter Q12H   Continuous Infusions: . HYDROmorphone 4.5 mg/hr (04/14/20 1300)  . ondansetron (ZOFRAN) IV       LOS: 27 days    Time spent: 15 mins     Shelly Coss, MD [facility] P9/06/2020, 1:54 PM

## 2020-04-15 DIAGNOSIS — I5030 Unspecified diastolic (congestive) heart failure: Secondary | ICD-10-CM

## 2020-04-15 MED ORDER — LORAZEPAM 2 MG/ML IJ SOLN
2.0000 mg | INTRAMUSCULAR | Status: DC
  Administered 2020-04-15 – 2020-04-16 (×6): 2 mg via INTRAVENOUS
  Filled 2020-04-15 (×6): qty 1

## 2020-04-15 MED ORDER — GLYCOPYRROLATE 0.2 MG/ML IJ SOLN
0.6000 mg | INTRAMUSCULAR | Status: DC
  Administered 2020-04-15 – 2020-04-16 (×6): 0.6 mg via INTRAVENOUS
  Filled 2020-04-15 (×6): qty 3

## 2020-04-15 NOTE — Progress Notes (Signed)
Daily Progress Note   Patient Name: [name]       Date: 04/15/2020 DOB: 01-28-1946  Age: 74 y.o. MRN#: [id number] Attending Physician: Flora Lipps, MD Primary Care Physician: Patient, No Pcp Per Admit Date: 03/09/2020  Reason for Consultation/Follow-up: end of life symptom management  Discussed with Kayla Dow, NP.  Epic chart reviewed.  Examined patient at bedside.  Subjective: Per [name] and Niece sitting at bedside, patient moans loudly when ativan wears off.  It seems to help a great deal when its given.  Patient is becoming more apneic at night.    Assessment: Shallow rapid breaths.  Lungs sound very wet.  Patient does not appear uncomfortable.  Pallor ++.  Mouth open - question if n/c oxygen is having any effect.  Not stable for transfer.   Patient Profile/HPI:  74 y.o.femalePatient with Stage IV breast cancer, currently s/p one round of nivolumab, has had radiation tx to spine, previously on Ibrance but did not tolerate; CHF with EF 35-40%, atrial fib/flutter- St. Jude ICD in place, HTN, DM2, CKD III. Currently admittedon 8/14/2021with sepsis secondary to bilateral lower extremity cellulitis. Admission complicated by afib with RVR- intolerant to many cardiac medications.Palliative consulted for goals of care.  9/3 Clinically worsening. Remains on milrinone and lasix infusions. Patient uncomfortable with dyspnea and wheezing. Creatinine worsening with uremia and tremors. Transitioned to comfort measures on 04/06/20.    Length of Stay: 28   Vital Signs: BP (!) 100/57 (BP Location: Left Arm)   Pulse 77   Temp 98 F (36.7 C) (Oral)   Resp 17   Ht 5\' 4"  (1.626 m)   Wt 75.8 kg   SpO2 96%   BMI 28.68 kg/m  SpO2: SpO2: 96 % O2 Device: O2 Device: Nasal  Cannula O2 Flow Rate: O2 Flow Rate (L/min): 2 L/min       Palliative Assessment/Data: 10%     Palliative Care Plan    Recommendations/Plan:  Will increase scheduled robinul significantly.  Patient does not appear to be urinating at all so I doubt Lasix will help.  Considered adding ativan infusion at 0.25 - but concerned about adding more fluid.  Instead will increase scheduled dose and frequency and check back in with grandson later today.  Code Status:  DNR  Prognosis:   Hours - Days  Discharge Planning:  Anticipated Hospital Death  Care plan was discussed with family at bedside.  Thank you for allowing the Palliative Medicine Team to assist in the care of this patient.  Total time spent:  35 min.     Greater than 50%  of this time was spent counseling and coordinating care related to the above assessment and plan.  Kayla Jenny, PA-C Palliative Medicine  Please contact Palliative MedicineTeam phone at [phone] for questions and concerns between 7 am - 7 pm.   Please see AMION for individual provider pager numbers.

## 2020-04-15 NOTE — Progress Notes (Signed)
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 fib, ventricular tachycardia status post ICD in situ, breast cancer with bone mets, Lynch syndrome  who presented to the ED on8/14/2021with complaint of shortness of breath, worsening redness on both upper legs with weeping lesions that started gradually after she used dermatome (exfoliative scrub) on her legs.  In the ED, [name] was noted to be in A. fib with RVR to 120s.  Chest x-ray showed cardiomegaly without overt failure.[name] was admitted to hospital service for sepsis secondary to bilateral lower extremity cellulitis. Hospital course was also complicated and was prolonged by acute exacerbation of CHF/cardiogenic shock.  Heart failure team was following.  After discussion about extensive goals of care, [name] wanted to stop aggressive care and pursue comfort care measures.  Palliative care  following.  Currently on full comfort care.  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 [name] was seen by cardiology initially and was on Lasix drip and milrinone drip but her respiratory status did not improve.  At this time, [name] is on Dilaudid drip for comfort care.  Bilateral lower extremity cellulitis Sepsis -POA -resolved Immunocompromised status -Antibiotic course completed on 8/22.  A. fib with RVR H/o paroxysmal A. Fib s/p left atrial appendage clip History of V. Tach, AICD -Presented with RVR, likely precipitated by sepsis. She was on amiodarone, Eliquis.   AICD has been deactivated at this time for comfort care.  Purely on comfort meds at this time.  History of Lynch syndrome with bone mets and chronic pain. On symptomatic care at this time.  He is to follow-up oncology as outpatient.  Acute kidney injury on CKD 3a -[name] had worsening renal function uremia and volume overload.  Currently on comfort care.  CAD s/p CABG Hyperlipidemia Off Eliquis.  Currently on comfort care.  Diabetes mellitus type 2  -A1c 7.2 on 8/15.  On comfort care.  Goals of care Palliative care on board.  Currently comfort care.  On dilaudid drip. Palliative care managing.  DVT prophylaxis: None  Code Status:  DNR/comfort care  Family Communication:  Spoke with multiple family members at bedside.  Status is: Inpatient  Remains inpatient appropriate because:Inpatient level of care appropriate due to severity of illness  Dispo:  [name] From:  Home  Planned Disposition: Hospital death anticipated    Consultants:  Cardiology   Antimicrobials:  None  Subjective:  [name] was seen and examined at bedside. Spoke with multiple family members  at bedside. [name] had some episodes of moaning and discomfort.   Objective: Vitals:   04/12/20 0543 04/13/20 0506 04/14/20 0506 04/15/20 0515  BP: 101/62 (!) 87/48 104/67 (!) 100/57   Pulse: (!) 108 99 (!) 111 77  Resp: 12 18 19 17   Temp: 98.1 F (36.7 C) 98.8 F (37.1 C) 98.5 F (36.9 C) 98 F (36.7 C)  TempSrc: Oral Oral Oral Oral  SpO2: 93% 98% 95% 96%  Weight:      Height:        Intake/Output Summary (Last 24 hours) at 04/15/2020 0943 Last data filed at 04/15/2020 0749 Gross per 24 hour  Intake 0 ml  Output --  Net 0 ml   Filed Weights   04/03/20 0400 04/05/20 0439 04/06/20 0500  Weight: 75.7 kg 79 kg 75.8 kg    Examination:  General: Lethargic, appears to be moaning Chest, coarse breath sounds. CVS: S1 &S2 heard.  Abdomen:  nondistended.  Data Reviewed: I have personally reviewed following labs and imaging studies  CBC: No results for input(s): WBC, NEUTROABS, HGB, HCT, MCV, PLT in the last 168 hours. Basic Metabolic Panel: No results for input(s): NA, K, CL, CO2, GLUCOSE, BUN, CREATININE, CALCIUM, MG, PHOS in the last 168 hours. GFR: Estimated Creatinine Clearance: 18.9 mL/min (A) (by C-G formula based on SCr of 2.68 mg/dL (H)). Liver Function Tests: No results for input(s): AST, ALT, ALKPHOS, BILITOT, PROT, ALBUMIN in the last 168 hours. No results for input(s): LIPASE, AMYLASE in the last 168 hours. No results for input(s): AMMONIA in the last 168 hours. Coagulation Profile: No results for input(s): INR, PROTIME in the last 168 hours. Cardiac Enzymes: No results for input(s): CKTOTAL, CKMB, CKMBINDEX, TROPONINI in the last 168 hours. BNP (last 3 results) No results for input(s): PROBNP in the last 8760 hours. HbA1C: No results for input(s): HGBA1C in the last 72 hours. CBG: No results for input(s): GLUCAP in the last 168 hours. Lipid Profile: No results for input(s): CHOL, HDL, LDLCALC, TRIG, CHOLHDL, LDLDIRECT in the last 72 hours. Thyroid Function Tests: No results for input(s): TSH, T4TOTAL, FREET4, T3FREE, THYROIDAB in the last 72 hours. Anemia Panel: No results for input(s): VITAMINB12, FOLATE, FERRITIN, TIBC, IRON,  RETICCTPCT in the last 72 hours. Sepsis Labs: No results for input(s): PROCALCITON, LATICACIDVEN in the last 168 hours.  No results found for this or any previous visit (from the past 240 hour(s)).     Radiology Studies: No results found.    Scheduled Meds: . chlorhexidine  15 mL Mouth Rinse BID  . glycopyrrolate  0.2 mg Intravenous Q6H  . LORazepam  0.5 mg Intravenous Q6H  . mouth rinse  15 mL Mouth Rinse q12n4p  . sodium chloride flush  10-40 mL Intracatheter Q12H   Continuous Infusions: . HYDROmorphone 4.5 mg/hr (04/15/20 0023)  . ondansetron (ZOFRAN) IV       LOS: 28 days    Flora Lipps, MD [facility] 04/15/2020, 9:43 AM

## 2020-04-26 ENCOUNTER — Encounter (HOSPITAL_COMMUNITY): Payer: PPO | Admitting: Internal Medicine

## 2020-05-04 NOTE — Progress Notes (Addendum)
Patient has expired. Night shift RN notified Dr. Louanne Belton. Patient's grandson at the bedside, offered chaplain services, family denied.

## 2020-05-04 NOTE — Discharge Summary (Addendum)
Death Summary  [name] WVP:[id number] DOB: 1945/09/28 DOA: 04-14-2020  PCP: Patient, No Pcp Per   Admit date: April 14, 2020 Date of Death: 05-14-2020  Final Diagnoses:  Principal Problem:   Sepsis due to cellulitis [facility]) Active Problems:   Acute kidney injury ([place])   Paroxysmal atrial fibrillation with rapid ventricular response ([name])   Acute on chronic systolic CHF (congestive heart failure) ([name])   Hypocalcemia   Cellulitis of left lower extremity   Hyperkalemia   Dehydration with hyponatremia   Lactic acidosis   Palliative care encounter   Palliative care by specialist   Reactive depression   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 fib, ventricular tachycardia status post ICD in situ, breast cancer with bone mets, Lynch syndrome  who presented to the ED on09/11/2021with complaint of shortness of breath, worsening redness on both upper legs with weeping lesions that started gradually after she used dermatome (exfoliative scrub) on her legs. In the ED, patient was noted to be in A. fib with RVR to 120s.  Chest x-ray showed cardiomegaly without overt failure.Patient was admitted to hospital service for sepsis secondary to bilateral lower extremity cellulitis. Hospital course was also complicated and was prolonged by acute exacerbation of CHF/cardiogenic shock.  Heart failure team was following.  After discussion about extensive goals of care, patient wanted to stop aggressive care and pursue comfort care measures.  Palliative care  following.  Currently on full comfort care.  She had prolonged  comfort care state because family preferring in-house comfort care rather than residential hospice  Hospital Course:   Acute exacerbation of chronic combined CHF/cardiogenic shock/ Essential hypertension Last known left ventricular ejection fraction of 35-40% with global hypokinesis. Patient was seen by cardiology initially and was on Lasix drip and milrinone drip but her respiratory status did not improve.Subsequently, patient was put on Dilaudid drip for comfort care.  Bilateral lower extremity cellulitis Sepsis -POA -improved Immunocompromised status -Antibiotic course completed on 8/22.  A. fib with RVR H/o paroxysmal A. Fib s/p left atrial appendage clip History of V. Tach, AICD -Presented with RVR, likely precipitated by sepsis. She was on amiodarone, Eliquis as outpatient which was discontinued.  AICD was deactivated at this time for comfort care.   History of Lynch syndrome with bone mets and chronic pain.  Acute kidney injury on CKD 3a -Patient had worsening renal function uremia and volume overload.  was transitioned to comfort care.  CAD s/p CABG Hyperlipidemia Off Eliquis once comfort care was initiated.    Diabetes mellitus type 2  -A1c 7.2 on 8/15.  On comfort care.  Patient was on Dilaudid drip and Ativan and was followed by palliative care during hospitalization for comfort care.  In the hospital death was anticipated.  Family members were at bedside.  Patient was finally pronounced dead by nursing staff.  Time of death: 0709  Signed:  Briante Loveall  [facility] 2020/05/14, 7:21 AM

## 2020-05-04 NOTE — Progress Notes (Signed)
Wasted 56mL of remaining Dilaudid drip with Court Joy, RN as witness.

## 2020-05-04 DEATH — deceased
